# Patient Record
Sex: Male | Born: 1965 | Race: White | Hispanic: No | Marital: Married | Smoking: Current every day smoker
Health system: Southern US, Community
[De-identification: ages and names within clinical notes are randomized; demographics above are authoritative.]

## PROBLEM LIST (undated history)

## (undated) DIAGNOSIS — I10 Essential (primary) hypertension: Secondary | ICD-10-CM

## (undated) DIAGNOSIS — R002 Palpitations: Secondary | ICD-10-CM

## (undated) DIAGNOSIS — R160 Hepatomegaly, not elsewhere classified: Secondary | ICD-10-CM

## (undated) DIAGNOSIS — R0602 Shortness of breath: Secondary | ICD-10-CM

## (undated) DIAGNOSIS — K7030 Alcoholic cirrhosis of liver without ascites: Secondary | ICD-10-CM

## (undated) DIAGNOSIS — K219 Gastro-esophageal reflux disease without esophagitis: Secondary | ICD-10-CM

## (undated) DIAGNOSIS — F419 Anxiety disorder, unspecified: Secondary | ICD-10-CM

## (undated) DIAGNOSIS — I493 Ventricular premature depolarization: Secondary | ICD-10-CM

## (undated) DIAGNOSIS — I8390 Asymptomatic varicose veins of unspecified lower extremity: Secondary | ICD-10-CM

## (undated) HISTORY — DX: Ventricular premature depolarization: I49.3

## (undated) HISTORY — DX: Gastro-esophageal reflux disease without esophagitis: K21.9

## (undated) HISTORY — DX: Anxiety disorder, unspecified: F41.9

## (undated) HISTORY — DX: Palpitations: R00.2

## (undated) HISTORY — PX: WISDOM TOOTH EXTRACTION: SHX21

## (undated) HISTORY — DX: Asymptomatic varicose veins of unspecified lower extremity: I83.90

## (undated) HISTORY — DX: Essential (primary) hypertension: I10

## (undated) HISTORY — DX: Alcoholic cirrhosis of liver without ascites: K70.30

## (undated) HISTORY — PX: OTHER SURGICAL HISTORY: SHX169

---

## 2002-03-06 ENCOUNTER — Emergency Department (HOSPITAL_COMMUNITY): Admission: EM | Admit: 2002-03-06 | Discharge: 2002-03-06 | Payer: Self-pay | Admitting: *Deleted

## 2002-03-06 ENCOUNTER — Encounter: Payer: Self-pay | Admitting: *Deleted

## 2002-08-24 ENCOUNTER — Ambulatory Visit (HOSPITAL_COMMUNITY): Admission: RE | Admit: 2002-08-24 | Discharge: 2002-08-24 | Payer: Self-pay | Admitting: Family Medicine

## 2002-10-24 ENCOUNTER — Encounter: Payer: Self-pay | Admitting: *Deleted

## 2002-10-24 ENCOUNTER — Emergency Department (HOSPITAL_COMMUNITY): Admission: EM | Admit: 2002-10-24 | Discharge: 2002-10-24 | Payer: Self-pay | Admitting: *Deleted

## 2004-11-22 ENCOUNTER — Ambulatory Visit (HOSPITAL_COMMUNITY): Admission: RE | Admit: 2004-11-22 | Discharge: 2004-11-22 | Payer: Self-pay | Admitting: Family Medicine

## 2005-05-12 ENCOUNTER — Emergency Department (HOSPITAL_COMMUNITY): Admission: EM | Admit: 2005-05-12 | Discharge: 2005-05-12 | Payer: Self-pay | Admitting: Emergency Medicine

## 2008-09-13 ENCOUNTER — Ambulatory Visit: Payer: Self-pay | Admitting: Cardiology

## 2008-09-14 ENCOUNTER — Encounter: Payer: Self-pay | Admitting: Cardiology

## 2008-09-14 LAB — CONVERTED CEMR LAB
ALT: 26 units/L
AST: 18 units/L
Albumin: 4.4 g/dL
Alkaline Phosphatase: 52 units/L
BUN: 11 mg/dL
Bilirubin, Direct: 1 mg/dL
CO2: 22 meq/L
Calcium: 9.7 mg/dL
Chloride: 105 meq/L
Creatinine, Ser: 0.9 mg/dL
Glucose, Bld: 83 mg/dL
HCT: 47.4 %
Hemoglobin: 15.6 g/dL
MCV: 102 fL
Magnesium: 1.9 mg/dL
Platelets: 212 10*3/uL
Potassium: 4.8 meq/L
Sodium: 140 meq/L
TSH: 2.3 microintl units/mL
Total Protein: 6.8 g/dL
WBC: 6.9 10*3/uL

## 2008-10-11 ENCOUNTER — Ambulatory Visit: Payer: Self-pay | Admitting: Cardiology

## 2008-11-23 ENCOUNTER — Ambulatory Visit: Payer: Self-pay | Admitting: Cardiology

## 2009-03-08 ENCOUNTER — Encounter (INDEPENDENT_AMBULATORY_CARE_PROVIDER_SITE_OTHER): Payer: Self-pay | Admitting: *Deleted

## 2009-05-05 ENCOUNTER — Ambulatory Visit: Payer: Self-pay | Admitting: Internal Medicine

## 2009-05-10 ENCOUNTER — Encounter: Payer: Self-pay | Admitting: Internal Medicine

## 2009-05-19 ENCOUNTER — Ambulatory Visit: Payer: Self-pay | Admitting: Internal Medicine

## 2009-05-19 ENCOUNTER — Ambulatory Visit (HOSPITAL_COMMUNITY): Admission: RE | Admit: 2009-05-19 | Discharge: 2009-05-19 | Payer: Self-pay | Admitting: Internal Medicine

## 2009-05-19 HISTORY — PX: ESOPHAGOGASTRODUODENOSCOPY: SHX1529

## 2009-06-27 ENCOUNTER — Encounter: Payer: Self-pay | Admitting: Gastroenterology

## 2009-06-29 ENCOUNTER — Encounter: Payer: Self-pay | Admitting: Gastroenterology

## 2009-07-01 ENCOUNTER — Encounter: Payer: Self-pay | Admitting: Gastroenterology

## 2009-07-05 ENCOUNTER — Telehealth (INDEPENDENT_AMBULATORY_CARE_PROVIDER_SITE_OTHER): Payer: Self-pay

## 2009-07-27 ENCOUNTER — Encounter: Payer: Self-pay | Admitting: Gastroenterology

## 2010-04-26 ENCOUNTER — Encounter: Payer: Self-pay | Admitting: Urgent Care

## 2010-05-18 ENCOUNTER — Ambulatory Visit: Payer: Self-pay | Admitting: Cardiology

## 2010-05-18 DIAGNOSIS — R1031 Right lower quadrant pain: Secondary | ICD-10-CM

## 2010-05-18 DIAGNOSIS — R1310 Dysphagia, unspecified: Secondary | ICD-10-CM | POA: Insufficient documentation

## 2010-05-18 DIAGNOSIS — K5900 Constipation, unspecified: Secondary | ICD-10-CM | POA: Insufficient documentation

## 2010-05-18 DIAGNOSIS — K219 Gastro-esophageal reflux disease without esophagitis: Secondary | ICD-10-CM

## 2010-05-18 DIAGNOSIS — F17200 Nicotine dependence, unspecified, uncomplicated: Secondary | ICD-10-CM

## 2010-05-18 DIAGNOSIS — N529 Male erectile dysfunction, unspecified: Secondary | ICD-10-CM

## 2011-01-09 NOTE — Medication Information (Signed)
Summary: PA for dexilant  PA for dexilant   Imported By: Hendricks Limes LPN 16/09/9603 54:09:81  _____________________________________________________________________  External Attachment:    Type:   Image     Comment:   External Document

## 2011-01-09 NOTE — Assessment & Plan Note (Signed)
Summary: F1Y  Medications Added PRILOSEC 40 MG CPDR (OMEPRAZOLE) TAKE 1 TAB DAILY TYLENOL 325 MG TABS (ACETAMINOPHEN) TAKE AS NEEDED NICOTINE 21-14-7 MG/24HR KIT (NICOTINE) take as directed by MD      Allergies Added: NKDA  Visit Type:  Follow-up Referring Provider:  Lubertha South Primary Provider:  Lubertha South   History of Present Illness: Bradley Miles returns to the office for continuing assessment and treatment of palpitations related to PVCs, tobacco abuse, hypertension and erectile dysfunction.  Since his most recent visit, he has been generally well.  Blood pressure control has been good when at rest.  He sometimes omits his dose of acebutolol and experiences exacerbation of his palpitations almost immediately thereafter.  He continues to smoke cigarettes.  A trial of Wellbutrin resulted in him feeling generally better, but not helping to curb his tobacco addiction.  He subsequently tried to an over-the-counter electric cigarette, probably a nicotine replacement devices, with a decrease in cigarette consumption to less than 5 per day; however, when he stopped using the device, he resumed his usual tobacco consumption.      Current Medications (verified): 1)  Miralax  Powd (Polyethylene Glycol 3350) .... One Capful Daily Prn 2)  Prilosec 40 Mg Cpdr (Omeprazole) .... Take 1 Tab Daily 3)  Acebutolol Hcl 400 Mg Caps (Acebutolol Hcl) .... Take 1 Tablet By Mouth Two Times A Day 4)  Tylenol 325 Mg Tabs (Acetaminophen) .... Take As Needed 5)  Nicotine 21-14-7 Mg/24hr Kit (Nicotine) .... Take As Directed By Md  Allergies (verified): No Known Drug Allergies  Past History:  PMH, FH, and Social History reviewed and updated.  Review of Systems       The patient complains of weight gain.  The patient denies anorexia, vision loss, decreased hearing, hoarseness, chest pain, syncope, dyspnea on exertion, peripheral edema, prolonged cough, headaches, hemoptysis, abdominal pain,  melena, and hematochezia.    Vital Signs:  Patient profile:   45 year old male Weight:      242 pounds BMI:     32.04 Pulse rate:   75 / minute BP sitting:   122 / 74  (right arm)  Vitals Entered By: Dreama Saa, CNA (May 18, 2010 2:36 PM)  Physical Exam  General:  Well developed; no acute distress Weight-242, and 10 pounds more than last year Neck-No JVD; no carotid bruits: Lungs-No tachypnea, no rales; no rhonchi; no wheezes: Cardiovascular-normal PMI; normal S1 and S2; modest systolic ejection murmur Abdomen-BS normal; soft and non-tender without masses or organomegaly:  Musculoskeletal-No deformities, no cyanosis or clubbing: Neurologic-Normal cranial nerves; symmetric strength and tone:  Skin-Warm, no significant lesions: Extremities-Nl distal pulses; no edema:     Impression & Recommendations:  Problem # 1:  TOBACCO ABUSE (ICD-305.1) Patient appears somewhat motivated to quit tobacco use.  Since nicotine replacement appeared to be somewhat effective, he will try an over-the-counter transdermal preparation at a dose of 21 mg per day.  He is encouraged to call should this approach not prove effective.  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  Problem # 3:  HYPERTENSION (ICD-401.1) Blood pressure is and has been in good on beta blocker alone.  Weight is substantially increased over the past year; decreased caloric intake and increased physical activity advised.  Patient Instructions: 1)  Your physician recommends that you schedule a follow-up appointment in: 1 year 2)  Your physician has recommended you make the following change  in your medication: Start using Nicotine patches to help you stop smoking 3)  Your physician discussed the hazards of tobacco use.  Tobacco use cessation is recommended and techniques and options to help you quit were discussed.

## 2011-04-24 NOTE — Op Note (Signed)
NAME:  Bradley Miles, Bradley Miles               ACCOUNT NO.:  [id number]   MEDICAL RECORD NO.:  [id number]          PATIENT TYPE:  AMB   LOCATION:  DAY                           FACILITY:  [facility]   PHYSICIAN:  R. Roetta Sessions, M.D. DATE OF BIRTH:  1966-01-01   DATE OF PROCEDURE:  05/19/2009  DATE OF DISCHARGE:                               OPERATIVE REPORT   PROCEDURE:  EGD with Elease Hashimoto dilation.   INDICATIONS FOR PROCEDURE:  A 45 year old gentleman with longstanding  gastroesophageal reflux disease symptoms with intermittent esophageal  dysphagia.  He has also had some bilateral lower quadrant abdominal pain  recently in a setting of constipation.  He is here for EGD with  esophageal dilation if it is appropriate.  The risks, benefits,  alternatives and limitations have been discussed with him previously and  again at the bedside.  Of note, his lower quadrant abdominal pain has  improved dramatically with the use of MiraLax which has temporarily been  associated with improved bowel function.   PROCEDURE NOTE:  O2 saturation, blood pressure, pulse and respirations  were monitored throughout the entirety of the procedure.  Conscious  sedation with Versed 5 mg IV, Demerol 125 mg IV in divided doses.  Instrument Pentax video chip system.  Cetacaine spray for topical  pharyngeal anesthesia.   FINDINGS:  Examination of the tubular esophagus revealed geographic  distal esophageal erosions with linear erosions extending approximately  4 cm up from the GE junction.  There was also a noncritical appearing  Schatzki's ring.  There was no feline appearance or furrows and nothing  to suggest eosinophilic esophagitis.  There was no evidence of Barrett's  esophagus.  EG junction easily traversed and entering the stomach.  Gastric cavity was emptied and insufflated well with air.  Thorough  examination of the gastric mucosa including retroflex of the proximal  stomach esophagogastric junction demonstrated  only a small hiatal  hernia.  Pylorus was patent and easily traversed.  Examination of the  bulb and second portion revealed no abnormalities.  Therapeutic/diagnostic maneuvers performed.  The scope was withdrawn.  A  56 French Maloney dilator was passed to full insertion with ease.  A  look back revealed the ring had been ruptured nicely with minimal  bleeding, otherwise the esophagus sustained no trauma whatsoever.  The  patient tolerated the procedure well and was reactive after endoscopy.   IMPRESSION:  1. Geographic distal esophageal erosions consistent with rather severe      erosive reflux esophagitis.  2. Schatzki's ring status post dilation.  3. Otherwise unremarkable esophagus.  4. Small hiatal hernia otherwise normal stomach, D1, D2.   RECOMMENDATIONS:  1. Antireflux literature provided to Bradley Miles.  2. Begin Nexium 40 mg orally twice daily, i.e. before breakfast and      supper for 2 weeks then drop back to once daily before breakfast      thereafter.  3. He is to go by my office for the first 2 weeks of therapy (free      samples).  I have written him a prescription for once daily  therapy.  4. Plan to see this nice gentleman back in 3 months.      Jonathon Bellows, M.D.  Electronically Signed     RMR/MEDQ  D:  05/19/2009  T:  05/19/2009  Job:  [id number]   cc:   Donna Bernard, M.D.  Fax: [phone]

## 2011-04-24 NOTE — Letter (Signed)
September 13, 2008    W. Simone Curia, MD  [address]  [place], [place] [address]   RE:  Bradley Miles, Bradley Miles  MRN:  [id number]  /  DOB:  [birth year]   Dear Brett Canales,   Bradley Miles is seen in the office today in consultation at your request  for palpitations.  As you know, this nice gentleman has enjoyed  generally excellent health.  He was hospitalized overnight only once,  and that was for a tooth abscess.  He has had some sort of a cyst or  tumor removed from his lower back in an outpatient procedure.  He has  had mild hypertension managed with a single agent.  Unfortunately, he  smokes 1-1/2 packs of cigarettes per day.  He has had stress tests in  the past related to evaluations for palpitations, most recently 6 years  ago, with negative results.   Of late, he has noted increase in palpitations.  These are usually  single extra beats with no other symptoms.  He has had spells with rapid  heart rate that had been prolonged.  On one occasion, he came to the  emergency department, but the arrhythmia had resolved by the time he  arrived.  There is some lightheadedness with these latter spells.  They  occur perhaps once per month while the lesser symptoms occur on a daily  basis.  He does not use significant amounts of caffeine.   CURRENT MEDICATION:  Only metoprolol 50 mg b.i.d.   ALLERGIES:  He has no known drug allergies.   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  No excessive use of alcohol.   FAMILY HISTORY:  Father died at age 101 due to a brain tumor.  Mother is  alive, but has some heart problems.  Two sisters are alive and well.   REVIEW OF SYSTEMS:  Notable for occasional headaches, the need for  corrective lenses - he uses contacts.  He has some GERD.   PHYSICAL EXAMINATION:  GENERAL:  Pleasant and well-appearing young  gentleman.  VITAL SIGNS:  The weight is 242, blood pressure 120/80, heart rate 85  and  regular, and respirations 14.  NECK:  No jugular venous distention; normal carotid upstrokes without  bruits.  ENDOCRINE:  No thyromegaly.  HEMATOPOIETIC:  No adenopathy.  SKIN:  No significant lesions.  HEENT:  Anicteric sclerae; normal lids and conjunctivae; contact lenses  in place; and normal oral mucosa.  LUNGS:  Clear.  CARDIAC:  Normal first and second heart sounds; normal PMI.  ABDOMEN:  Soft and nontender; normal bowel sounds; and no organomegaly.  EXTREMITIES:  No edema; normal distal pulses.  NEUROLOGIC:  Symmetric strength and tone; normal cranial nerves.   EKG:  Normal sinus rhythm; delayed R-wave progression; leftward axis;  RSR prime; otherwise normal.  No prior tracing for comparison.   IMPRESSION:  Bradley Miles symptoms are definitely consistent with  premature ventricular contractions or premature atrial contractions for  the most part; however, his prolonged spells could represent atrial  fibrillation or paroxysmal supraventricular tachycardia.  A more serious  arrhythmia is unlikely.  We will proceed with an event recorder to more  accurately characterize the nature of his presumed arrhythmia.  He will  discontinue metoprolol during the recording interval.  He will monitor  blood pressure and report any substantially elevated values.  He reports  no recent lab work.  We will obtain a CBC, chemistry  profile, TSH, and  magnesium level.  I will reassess this nice gentleman after his period  of event recording has been completed.  Thanks so much for sending him  to see me.    Sincerely,      Gerrit Friends. Dietrich Pates, MD, [facility]  Electronically Signed    RMR/MedQ  DD: 09/13/2008  DT: 09/14/2008  Job #: [id number]

## 2011-04-24 NOTE — Letter (Signed)
October 11, 2008    W. Simone Curia, MD  [address]  [place], [place] [address]   RE:  Bradley Miles, Bradley Miles  MRN:  [id number]  /  DOB:  09-28-1966   Dear Bradley Miles,   Bradley Miles returns to the office for continued assessment and treatment  of palpitations.  He has carried an event recorder and demonstrated  accuracy in identifying infrequent PVCs.  Every time, he reported a  skipped beat, a PVC was present.  He also reported periods of heart  racing with no arrhythmia was noted.  On some occasions, sinus  tachycardia up to a rate of 120 was present, but on others, the heart  rate was less than 100.  He feels that his arrhythmia has been about at  baseline for the past month.  He does not note any significant change  with stopping metoprolol.   He also reports erectile dysfunction.  This was first evaluated by  urologist 12 years ago.  He uses Viagra one-third of a 100 mg tablet  with generally good results.   PHYSICAL EXAMINATION:  GENERAL:  Pleasant, healthy youthful gentleman.  VITAL SIGNS:  The weight is 236, 6 pounds less than at his last visit,  blood pressure 120/90, heart rate 72 and regular, respirations 14.  NECK:  No jugular venous distention.  LUNGS:  Clear.  CARDIAC:  Normal first and second heart sounds.   List of blood pressures was brought in, of 16 measurements,  approximately three-quarter show blood pressures between 140 and 170  systolic.  Diastolics are typically between 90 and 105.   IMPRESSION:  Bradley Miles has premature ventricular contractions that are  symptomatic.  These almost certainly do not represent significant  organic heart disease.  In attempt to control these symptoms, acebutolol  will be started at a dose of 200 mg b.i.d.  This will be increased by  the patient to 400 mg b.i.d. in 2 weeks if symptoms are inadequately  controlled.  He will monitor blood pressure, which certainly needs  pharmacologic therapy.  I will see him again in 1  month.    Sincerely,      Gerrit Friends. Dietrich Pates, MD, [facility]  Electronically Signed    RMR/MedQ  DD: 10/11/2008  DT: 10/12/2008  Job #: [id number]

## 2011-04-24 NOTE — Letter (Signed)
November 23, 2008    W. Simone Curia, M.D.  [address]  [place], [place] [address]   RE:  Bradley Miles, Bradley Miles  MRN:  [id number]  /  DOB:  1966-10-11   Dear Bradley Miles,   Bradley Miles returns to the office for continued assessment and treatment  of palpitations related to PVCs.  He did not derive much benefit from  Sectral 200 mg b.i.d., but when the dose was increased to 400 mg, he  noted a marked decrease in frequency of symptoms.  Although not  completely abolished, he notes perhaps one or two brief episodes per  day, a level he finds quite acceptable.   We also discussed cigarette smoking extensively.  He is willing to  attempt to quit.  He has tried before without pharmacologic aids and  lasted approximately 2 weeks.  I explained the need to be committed to  quit and to keep trying.  He would like to start with Zaiban, a  prescription which was given to him.   PHYSICAL EXAMINATION:  GENERAL:  Healthy-appearing young gentleman.  VITAL SIGNS:  The weight is 237 pounds, 5 pounds less than at his last  visit.  Blood pressure 125/70, heart rate 65 and regular, respirations  14.  NECK:  No jugular venous distention.  LUNGS:  Clear.  CARDIAC:  Normal first and second heart sounds.   IMPRESSION:  Bradley Miles is doing well on beta-blocker.  This has also  resulted in slowing of his heart rate and an improvement in his blood  pressure.  You might want to follow up on smoking cessation since I will  not be seeing him for 12 months.    Sincerely,      Gerrit Friends. Dietrich Pates, MD, [facility]  Electronically Signed    RMR/MedQ  DD: 11/23/2008  DT: 11/24/2008  Job #: [id number]

## 2011-04-27 NOTE — Procedures (Signed)
   NAME:  Bradley Miles, Bradley Miles NO.:  [id number]   MEDICAL RECORD NO.:  [id number]                  PATIENT TYPE:   LOCATION:                                       FACILITY:   PHYSICIAN:  Donna Bernard, M.D.             DATE OF BIRTH:   DATE OF PROCEDURE:  08/24/2002  DATE OF DISCHARGE:                                    STRESS TEST   INDICATIONS FOR TEST:  This patient is a 74-ish year-old white male with a  history of atypical chest pain with exertion.  He has also had significant  aggravations with heart skipping.  Recently was seen in the emergency room.  Years ago he had test which confirmed PVCs.   STRESS TEST:  Stress test was performed with standard Bruce protocol.  Resting EKG revealed normal sinus rhythm.  No significant ST-T changes.  The  patient tolerated the first two stages well.  Two minutes into the third  stage he developed progressive fatigue.  He surpassed the submaxed predicted  heart rate of 158.  At that point at 0.08 seconds past the J point there  were no significant ST changes noted.  Of importance, the patient did have  several PVCs which occurred in the first couple stages of his test.   IMPRESSION:  1. Negative adequate stress test.  2. Premature ventricular contractions.  Discussed with patient with his     significant amount of anxiety and stress, patient would likely take an     antianxiety medicine to see if it helps.   PLAN:  Initiate Lexapro 10 mg daily.  Rationale discussed.  Exercise  encouraged.  Follow up in several weeks.                                               Donna Bernard, M.D.    WSL/MEDQ  D:  08/24/2002  T:  08/24/2002  Job:  [id number]

## 2011-06-11 ENCOUNTER — Other Ambulatory Visit: Payer: Self-pay | Admitting: *Deleted

## 2011-06-11 ENCOUNTER — Encounter: Payer: Self-pay | Admitting: *Deleted

## 2011-06-11 ENCOUNTER — Other Ambulatory Visit: Payer: Self-pay | Admitting: Cardiology

## 2011-06-11 MED ORDER — ACEBUTOLOL HCL 400 MG PO CAPS: 400.0000 mg | ORAL_CAPSULE | Freq: Two times a day (BID) | ORAL | Status: DC

## 2011-07-13 ENCOUNTER — Other Ambulatory Visit: Payer: Self-pay | Admitting: Cardiology

## 2011-08-29 ENCOUNTER — Other Ambulatory Visit: Payer: Self-pay | Admitting: Gastroenterology

## 2011-08-29 NOTE — Telephone Encounter (Signed)
Pt to be set up for routine office visit f/u for further refills.

## 2011-08-29 NOTE — Telephone Encounter (Signed)
Please set up for appt for routine OV, refills.

## 2011-08-30 ENCOUNTER — Encounter: Payer: Self-pay | Admitting: Internal Medicine

## 2011-08-30 NOTE — Telephone Encounter (Signed)
Letter mailed to patient to call and set up OV to further his refills

## 2011-10-01 ENCOUNTER — Other Ambulatory Visit: Payer: Self-pay | Admitting: Cardiology

## 2011-10-01 NOTE — Telephone Encounter (Signed)
Patient past due for follow up.  Appointment made @ this time.

## 2011-10-04 ENCOUNTER — Encounter: Payer: Self-pay | Admitting: Adult Health

## 2011-10-04 ENCOUNTER — Ambulatory Visit (INDEPENDENT_AMBULATORY_CARE_PROVIDER_SITE_OTHER): Payer: 59 | Admitting: Adult Health

## 2011-10-04 VITALS — BP 143/87 | HR 76 | Resp 18 | Ht 73.0 in | Wt 243.0 lb

## 2011-10-04 DIAGNOSIS — R002 Palpitations: Secondary | ICD-10-CM | POA: Insufficient documentation

## 2011-10-04 NOTE — Patient Instructions (Signed)
Your physician recommends that you continue on your current medications as directed. Please refer to the Current Medication list given to you today.  Your physician recommends that you schedule a follow-up appointment in: 1 year  

## 2011-10-04 NOTE — Progress Notes (Signed)
HPI: [name] is a 45 y/o patient of Dr. Dietrich Pates who  returns to the office for continuing assessment and treatment of palpitations, PVC's, hypertension and ongoing tobacco abuse.  He was placed on acebutolol by Dr. Dietrich Pates on last visit and has had only occasional episodes of these since. He attributes it to drinking caffeine, or usually after a cigarette. He has no plans to quit tobacco, but has cut down on the caffeine intake.  No Known Allergies  Current Outpatient Prescriptions  Medication Sig Dispense Refill  . acebutolol (SECTRAL) 400 MG capsule take 1 capsule by mouth twice a day  60 capsule  1  . DEXILANT 60 MG capsule TAKE 1 CAPSULE BY MOUTH BEFORE BREAKFAST  31 capsule  1    RUE:[id number] of systems complete and found to be negative unless listed above PHYSICAL EXAM BP 143/87  Pulse 76  Resp 18  Ht 6\' 1"  (1.854 m)  Wt 110.224 kg (243 lb)  BMI 32.06 kg/m2  ASSESSMENT AND PLAN

## 2011-10-04 NOTE — Assessment & Plan Note (Signed)
He reports improvement in symptoms unless he smokes or drinks caffeine.  He continues medically compliant, however. I have advised him to cut out caffeine completely and try to quit smoking as he apparently is very sensitive to stimulants.

## 2011-11-07 ENCOUNTER — Other Ambulatory Visit: Payer: Self-pay

## 2011-11-07 NOTE — Telephone Encounter (Signed)
No refills until OV as per note in 08/2011.

## 2011-11-12 NOTE — Telephone Encounter (Signed)
Tried to call the pt but I leaved a message to call back. I also called the drug store and advised them that he needed an office appointment.

## 2011-11-14 ENCOUNTER — Encounter: Payer: Self-pay | Admitting: Internal Medicine

## 2011-11-14 NOTE — Telephone Encounter (Signed)
Pt has an appointment on 11/15/11

## 2011-11-15 ENCOUNTER — Encounter: Payer: Self-pay | Admitting: Gastroenterology

## 2011-11-15 ENCOUNTER — Ambulatory Visit (INDEPENDENT_AMBULATORY_CARE_PROVIDER_SITE_OTHER): Payer: 59 | Admitting: Gastroenterology

## 2011-11-15 VITALS — BP 139/87 | HR 72 | Temp 98.2°F | Ht 73.0 in | Wt 242.2 lb

## 2011-11-15 DIAGNOSIS — K219 Gastro-esophageal reflux disease without esophagitis: Secondary | ICD-10-CM

## 2011-11-15 DIAGNOSIS — Z8371 Family history of colonic polyps: Secondary | ICD-10-CM

## 2011-11-15 MED ORDER — DEXLANSOPRAZOLE 60 MG PO CPDR: 60.0000 mg | DELAYED_RELEASE_CAPSULE | Freq: Every day | ORAL | Status: DC

## 2011-11-15 NOTE — Patient Instructions (Signed)
I have sent a prescription for Dexilant to [facility] in [place]. Based on your family history of colon polyps, you should have a colonoscopy. Call us when you are ready. Please limit your alcohol use.   Diet for GERD or PUD Nutrition therapy can help ease the discomfort of gastroesophageal reflux disease (GERD) and peptic ulcer disease (PUD).  HOME CARE INSTRUCTIONS   Eat your meals slowly, in a relaxed setting.   Eat 5 to 6 small meals per day.   If a food causes distress, stop eating it for a period of time.  FOODS TO AVOID  Coffee, regular or decaffeinated.   Cola beverages, regular or low calorie.   Tea, regular or decaffeinated.   Pepper.   Cocoa.   High fat foods, including meats.   Butter, margarine, hydrogenated oil (trans fats).   Peppermint or spearmint (if you have GERD).   Fruits and vegetables if not tolerated.   Alcohol.   Nicotine (smoking or chewing). This is one of the most potent stimulants to acid production in the gastrointestinal tract.   Any food that seems to aggravate your condition.  If you have questions regarding your diet, ask your caregiver or a registered dietitian. TIPS  Lying flat may make symptoms worse. Keep the head of your bed raised 6 to 9 inches (15 to 23 cm) by using a foam wedge or blocks under the legs of the bed.   Do not lay down until 3 hours after eating a meal.   Daily physical activity may help reduce symptoms.  MAKE SURE YOU:   Understand these instructions.   Will watch your condition.   Will get help right away if you are not doing well or get worse.  Document Released: 11/26/2005 Document Revised: 08/08/2011 Document Reviewed: 04/11/2009 [facility] Patient Information 2012 [place], [place].

## 2011-11-15 NOTE — Progress Notes (Signed)
Primary Care Physician: Harlow Asa, MD, MD  Primary Gastroenterologist:  Roetta Sessions, MD   Chief Complaint  Patient presents with  . Medication Refill  . Gastrophageal Reflux    HPI: Bradley Miles is a 45 y.o. male here for followup of acid reflux disease. Last seen over 2 years ago. EGD at that time showed severe reflux esophagitis, Schatzki ring, small hiatal hernia. His esophagus was dilated. He has been on Dexilant 60 mg daily with good control. If he misses his medication 2 days he has recurrent heartburn. Recently ran out of his medication has been taking over-the-counter omeprazole and having refractory heartburn. Denies vomiting, abdominal pain, constipation, diarrhea, melena, rectal bleeding, dysphagia. Tells me that he has 2 sisters who recently had colonoscopies and both had colon polyps. They are in their mid 54s. He's been advised to have a colonoscopy due to family history of adenomatous colon polyps. No family history of colon cancer.  Past Surgical History  Procedure Date  . Esophagogastroduodenoscopy 05/19/09    Geographic distal esophageal erosions consistent with severe erosive reflux esophagitis. schatzki's ring s/P dilation/small hiatal hernia otherwise normal stomach  . Cyst reomved     from spine     Current Outpatient Prescriptions  Medication Sig Dispense Refill  . acebutolol (SECTRAL) 400 MG capsule take 1 capsule by mouth twice a day  60 capsule  1  . DEXILANT 60 MG capsule TAKE 1 CAPSULE BY MOUTH BEFORE BREAKFAST  31 capsule  1    Allergies as of 11/15/2011  . (No Known Allergies)    ROS:  General: Negative for anorexia, weight loss, fever, chills, fatigue, weakness. ENT: Negative for hoarseness, difficulty swallowing , nasal congestion. CV: Negative for chest pain, angina, palpitations, dyspnea on exertion, peripheral edema.  Respiratory: Negative for dyspnea at rest, dyspnea on exertion, cough, sputum, wheezing.  GI: See history of present  illness. GU:  Negative for dysuria, hematuria, urinary incontinence, urinary frequency, nocturnal urination.  Endo: Negative for unusual weight change.    Physical Examination:   BP 139/87  Pulse 72  Temp(Src) 98.2 F (36.8 C) (Temporal)  Ht 6\' 1"  (1.854 m)  Wt 242 lb 3.2 oz (109.861 kg)  BMI 31.95 kg/m2  General: Well-nourished, well-developed in no acute distress.  Eyes: No icterus. Mouth: Oropharyngeal mucosa moist and pink , no lesions erythema or exudate. Lungs: Clear to auscultation bilaterally.  Heart: Regular rate and rhythm, no murmurs rubs or gallops.  Abdomen: Bowel sounds are normal, nontender, nondistended, no hepatosplenomegaly or masses, no abdominal bruits or hernia , no rebound or guarding.   Extremities: No lower extremity edema. No clubbing or deformities. Neuro: Alert and oriented x 4   Skin: Warm and dry, no jaundice.   Psych: Alert and cooperative, normal mood and affect.

## 2011-11-15 NOTE — Assessment & Plan Note (Signed)
Resume Dexilant. Rebate card and samples provided. New RX sent to his pharmacy. Discussed anti-reflux measures.  Recommended limiting alcohol use. Currently not excessive, but borderline.

## 2011-11-15 NOTE — Assessment & Plan Note (Addendum)
Recommend colonoscopy given family history of colon polyps in 2 sisters in their mid 43s. Patient is aware of recommendation, he will call as when he is ready to schedule procedure.

## 2011-11-15 NOTE — Progress Notes (Signed)
Cc to PCP 

## 2011-11-22 NOTE — Progress Notes (Signed)
REVIEWED.  

## 2012-01-05 ENCOUNTER — Other Ambulatory Visit: Payer: Self-pay | Admitting: Cardiology

## 2012-01-07 ENCOUNTER — Telehealth: Payer: Self-pay | Admitting: Cardiology

## 2012-01-07 ENCOUNTER — Telehealth: Payer: Self-pay | Admitting: Adult Health

## 2012-01-07 MED ORDER — ACEBUTOLOL HCL 400 MG PO CAPS: 400.0000 mg | ORAL_CAPSULE | Freq: Every day | ORAL | Status: DC

## 2012-01-07 NOTE — Telephone Encounter (Signed)
Patient needs refill on Acebutolol called in to [facility] in [place]. / tg

## 2012-01-07 NOTE — Telephone Encounter (Signed)
PHARMACY NEEDS CLARIFICATION ON DIRECTIONS FOR ACEBUTOLOL

## 2012-01-08 ENCOUNTER — Other Ambulatory Visit: Payer: Self-pay | Admitting: *Deleted

## 2012-01-08 MED ORDER — ACEBUTOLOL HCL 400 MG PO CAPS: 400.0000 mg | ORAL_CAPSULE | Freq: Two times a day (BID) | ORAL | Status: DC

## 2012-01-18 ENCOUNTER — Encounter: Payer: Self-pay | Admitting: Adult Health

## 2012-01-18 ENCOUNTER — Ambulatory Visit (INDEPENDENT_AMBULATORY_CARE_PROVIDER_SITE_OTHER): Payer: 59 | Admitting: Adult Health

## 2012-01-18 VITALS — BP 150/97 | HR 70 | Resp 18 | Ht 73.0 in | Wt 250.0 lb

## 2012-01-18 DIAGNOSIS — I10 Essential (primary) hypertension: Secondary | ICD-10-CM | POA: Insufficient documentation

## 2012-01-18 DIAGNOSIS — F17200 Nicotine dependence, unspecified, uncomplicated: Secondary | ICD-10-CM

## 2012-01-18 DIAGNOSIS — G4700 Insomnia, unspecified: Secondary | ICD-10-CM | POA: Insufficient documentation

## 2012-01-18 DIAGNOSIS — E7800 Pure hypercholesterolemia, unspecified: Secondary | ICD-10-CM

## 2012-01-18 MED ORDER — ZOLPIDEM TARTRATE 5 MG PO TABS: 5.0000 mg | ORAL_TABLET | Freq: Every evening | ORAL | Status: DC | PRN

## 2012-01-18 NOTE — Assessment & Plan Note (Signed)
Blood pressure is not adequately controlled currently with self reported high readings at home. I have talked with him about smoking cessation and caffeine again. Will start him on Benicar 20/12.5 mg and samples are provided. He will return in a week or two for BP check in the office.

## 2012-01-18 NOTE — Patient Instructions (Signed)
Your physician has recommended you make the following change in your medication: start taking Benicar 20/12.5 mg daily and Ambien 5 mg at bedtime as needed for sleep  Your physician recommends that you return for lab work in: next week  Your physician recommends that you schedule a follow-up appointment in: 1 month

## 2012-01-18 NOTE — Progress Notes (Signed)
HPI: [name] is a 46 y/o patient of Dr. Dietrich Miles who  returns to the office for continuing assessment and treatment of palpitations, PVC's, hypertension and ongoing tobacco abuse.  He was placed on acebutolol by Dr. Dietrich Miles on last visit and has had only occasional episodes of these since. He attributes it to drinking caffeine, or usually after a cigarette. He has no plans to quit tobacco, but has cut down on the caffeine intake. He states that since being seen last, he has had episodes of dizziness and weakness. He has checked his BP and found it to be elevated at 167-188 systolic over 90-100 diastolic. He rests and it sometimes comes down. He is working a flex shift moving from days to nights every 2 weeks. He admits to not sleeping well and using caffeine to stay awake.   No Known Allergies  Current Outpatient Prescriptions  Medication Sig Dispense Refill  . acebutolol (SECTRAL) 400 MG capsule Take 1 capsule (400 mg total) by mouth 2 (two) times daily.  180 capsule  3  . dexlansoprazole (DEXILANT) 60 MG capsule Take 1 capsule (60 mg total) by mouth daily.  31 capsule  11  . olmesartan-hydrochlorothiazide (BENICAR HCT) 20-12.5 MG per tablet Take 1 tablet by mouth daily.      Marland Kitchen zolpidem (AMBIEN) 5 MG tablet Take 1 tablet (5 mg total) by mouth at bedtime as needed for sleep.  30 tablet  1    ZOX:WRUEAV of systems complete and found to be negative unless listed above PHYSICAL EXAM BP 150/97  Pulse 70  Resp 18  Ht 6\' 1"  (1.854 m)  Wt 250 lb (113.399 kg)  BMI 32.98 kg/m2  General: Well developed, well nourished, in no acute distress Head: Eyes PERRLA, No xanthomas.   Normal cephalic and atramatic  Lungs: Clear bilaterally to auscultation and percussion. Heart: HRRR S1 S2, without MRG.  Pulses are 2+ & equal.            No carotid bruit. No JVD.  No abdominal bruits. No femoral bruits. Abdomen: Bowel sounds are positive, abdomen soft and non-tender without masses or    Hernia's noted. Msk:  Back normal, normal gait. Normal strength and tone for age. Extremities: No clubbing, cyanosis or edema.  DP +1 Neuro: Alert and oriented X 3. Psych:  Good affect, responds appropriately  ASSESSMENT AND PLAN

## 2012-01-18 NOTE — Assessment & Plan Note (Signed)
Discussed smoking cessation again. He says that he will try the patches. I discussed CVRF for continuation of smoking and future cardiac disease. He verbalized understanding.

## 2012-01-18 NOTE — Assessment & Plan Note (Signed)
He works alternating day and night shifts and often has trouble sleeping during the day. I have given him Ambien 5 mg to take prn. He is advised to get black out curtains to assist with daytime light coming in during sleep. He verbalizes understanding.

## 2012-02-01 ENCOUNTER — Telehealth: Payer: Self-pay

## 2012-02-01 ENCOUNTER — Telehealth: Payer: Self-pay | Admitting: Adult Health

## 2012-02-01 NOTE — Telephone Encounter (Signed)
**Note De-Identified  Obfuscation** On last OV with Joni Reining, NP on 01-18-12 pt. was advised to take Benicar 20/12.5 mg daily and Ambien 5 mg at bedtime as needed for sleep. He states that since starting Benicar he has been dizzy and even passed out last night while coughing. Pt. states that he has not stated taking Ambien yet so he feels that Benicar is causing side effects. Please advise./LV

## 2012-02-01 NOTE — Telephone Encounter (Signed)
**Note De-Identified  Obfuscation** Pt. Came into office for orthostatic BP's.  Lying: 145/87  72 Sitting: 135/77  79 Standing: 133/88  76 Pt. had no s/s. Per Joni Reining, NP pt. is advised to stop taking Benicar, to monitor BP and bring back diary to scheduled OV on 02-15-12

## 2012-02-01 NOTE — Telephone Encounter (Signed)
Bring him in for orthostatics. Coughing can cause some syncope in rare occassions. I  Joni Reining NP

## 2012-02-01 NOTE — Telephone Encounter (Signed)
PT WAS PUT ON BENICAR, HE THINKS HE MAY BE HAVING A REACTION TO IT. HE FEELS DIZZY. LAST NIGHT HE HAD A COUGHING SPELL AND PASSED OUT.

## 2012-02-01 NOTE — Telephone Encounter (Signed)
**Note De-Identified  Obfuscation** Pt. Is coming to office today around 3:30 so we can obtain orthostatic BP's.

## 2012-02-04 ENCOUNTER — Other Ambulatory Visit: Payer: Self-pay | Admitting: Adult Health

## 2012-02-05 LAB — HEPATIC FUNCTION PANEL
Albumin: 4.3 g/dL (ref 3.5–5.2)
Bilirubin, Direct: 0.1 mg/dL (ref 0.0–0.3)
Total Bilirubin: 0.6 mg/dL (ref 0.3–1.2)

## 2012-02-05 LAB — BASIC METABOLIC PANEL
BUN: 14 mg/dL (ref 6–23)
Chloride: 106 mEq/L (ref 96–112)
Glucose, Bld: 88 mg/dL (ref 70–99)
Potassium: 5.1 mEq/L (ref 3.5–5.3)

## 2012-02-05 LAB — LIPID PANEL
Cholesterol: 228 mg/dL — ABNORMAL HIGH (ref 0–200)
VLDL: 52 mg/dL — ABNORMAL HIGH (ref 0–40)

## 2012-02-06 ENCOUNTER — Other Ambulatory Visit: Payer: Self-pay

## 2012-02-06 DIAGNOSIS — E782 Mixed hyperlipidemia: Secondary | ICD-10-CM

## 2012-02-06 MED ORDER — ATORVASTATIN CALCIUM 40 MG PO TABS: 40.0000 mg | ORAL_TABLET | Freq: Every day | ORAL | Status: DC

## 2012-02-15 ENCOUNTER — Encounter: Payer: Self-pay | Admitting: Adult Health

## 2012-02-15 ENCOUNTER — Ambulatory Visit (INDEPENDENT_AMBULATORY_CARE_PROVIDER_SITE_OTHER): Payer: 59 | Admitting: Adult Health

## 2012-02-15 DIAGNOSIS — E782 Mixed hyperlipidemia: Secondary | ICD-10-CM | POA: Insufficient documentation

## 2012-02-15 DIAGNOSIS — R002 Palpitations: Secondary | ICD-10-CM

## 2012-02-15 DIAGNOSIS — I10 Essential (primary) hypertension: Secondary | ICD-10-CM

## 2012-02-15 DIAGNOSIS — F17200 Nicotine dependence, unspecified, uncomplicated: Secondary | ICD-10-CM

## 2012-02-15 MED ORDER — ACEBUTOLOL HCL 400 MG PO CAPS: 400.0000 mg | ORAL_CAPSULE | Freq: Two times a day (BID) | ORAL | Status: DC

## 2012-02-15 NOTE — Assessment & Plan Note (Signed)
I reviewed his lipid study with him. He states he does not want to take the avorvastatin.  I showed him a copy of the results with TC  228;TG 261 and LDL 129.  He is reluctant to take any medications or to change eating and alcohol habits.  I have reinforced the need to continue medication. The long term risks are reinforced to him.

## 2012-02-15 NOTE — Assessment & Plan Note (Signed)
He tells me that the BP was elevated on last visit because he was not taking the acebutolol. He did not tolerate benicar/hctz causing him to pass out after coughing. I see that his BP is well controlled without its use today. Will not continue this medication.

## 2012-02-15 NOTE — Assessment & Plan Note (Signed)
He admits to not taking acebutolol regularly, but when his palpitations come back he will begin taking it again. He has not stopped caffeine or tobacco. He does not seem to want to take responsibility for the longterm consequences of medical noncompliance and lifestyle risks despite my counseling.

## 2012-02-15 NOTE — Assessment & Plan Note (Signed)
He has no plans to stop smoking.

## 2012-02-15 NOTE — Progress Notes (Signed)
HPI: Bradley Miles is a 46 y/o patient of Dr. Dietrich Pates who  returns to the office for continuing assessment and treatment of palpitations, PVC's, hypertension and ongoing tobacco abuse.  He was placed on acebutolol by Dr. Dietrich Pates on last visit and has had only occasional episodes of these since. He attributes it to drinking caffeine, or usually after a cigarette. He has no plans to quit tobacco, but has cut down on the caffeine intake. He states that since being seen last, he has had episodes of dizziness and weakness. He has checked his BP and found it to be elevated at 167-188 systolic over 90-100 diastolic. He rests and it sometimes comes down. He is working a flex shift moving from days to nights every 2 weeks. He admits to not sleeping well and using caffeine to stay awake. On last visit, I started him on Benicar/HCTZ for better BP control. He had a syncopal episode after a coughing spell after taking the Benicar/HCTZ.  He stopped taking it. He admits to not taking his acebutolol regularly,or other medications as prescribed. He continues to drink each day to unwind.  No Known Allergies  Current Outpatient Prescriptions  Medication Sig Dispense Refill  . acebutolol (SECTRAL) 400 MG capsule Take 1 capsule (400 mg total) by mouth 2 (two) times daily.  180 capsule  3  . atorvastatin (LIPITOR) 40 MG tablet Take 1 tablet (40 mg total) by mouth daily.  30 tablet  6  . dexlansoprazole (DEXILANT) 60 MG capsule Take 1 capsule (60 mg total) by mouth daily.  31 capsule  11  . zolpidem (AMBIEN) 5 MG tablet Take 1 tablet (5 mg total) by mouth at bedtime as needed for sleep.  30 tablet  1    WJX:BJYNWG of systems complete and found to be negative unless listed above PHYSICAL EXAM BP 134/88  Pulse 74  Ht 6\' 1"  (1.854 m)  Wt 251 lb (113.853 kg)  BMI 33.12 kg/m2  General: Well developed, well nourished, in no acute distress Head: Eyes PERRLA, No xanthomas.   Normal cephalic and atramatic  Lungs: Clear  bilaterally to auscultation and percussion. Heart: HRRR S1 S2, without MRG.  Pulses are 2+ & equal.            No carotid bruit. No JVD.  No abdominal bruits. No femoral bruits. Abdomen: Bowel sounds are positive, abdomen soft and non-tender without masses or                  Hernia's noted. Msk:  Back normal, normal gait. Normal strength and tone for age. Extremities: No clubbing, cyanosis or edema.  DP +1 Neuro: Alert and oriented X 3. Psych:  Good affect, responds appropriately  ASSESSMENT AND PLAN

## 2012-02-15 NOTE — Patient Instructions (Signed)
Your physician recommends that you schedule a follow-up appointment in: 6 months  Your physician recommends that you schedule a follow-up appointment in:  STOP Benicar  RESTART Acebutolol 400 mg twice daily

## 2012-03-06 ENCOUNTER — Other Ambulatory Visit: Payer: Self-pay

## 2012-03-06 DIAGNOSIS — E782 Mixed hyperlipidemia: Secondary | ICD-10-CM

## 2012-07-19 ENCOUNTER — Other Ambulatory Visit: Payer: Self-pay | Admitting: Family Medicine

## 2012-07-19 ENCOUNTER — Ambulatory Visit (HOSPITAL_COMMUNITY)
Admission: RE | Admit: 2012-07-19 | Discharge: 2012-07-19 | Disposition: A | Discharge status: Home / Self Care | Payer: 59 | Source: Ambulatory Visit | Attending: Family Medicine | Admitting: Family Medicine

## 2012-07-19 DIAGNOSIS — S2239XA Fracture of one rib, unspecified side, initial encounter for closed fracture: Secondary | ICD-10-CM | POA: Insufficient documentation

## 2012-07-19 DIAGNOSIS — R05 Cough: Secondary | ICD-10-CM | POA: Insufficient documentation

## 2012-07-19 DIAGNOSIS — R0781 Pleurodynia: Secondary | ICD-10-CM

## 2012-07-19 DIAGNOSIS — X58XXXA Exposure to other specified factors, initial encounter: Secondary | ICD-10-CM | POA: Insufficient documentation

## 2012-07-19 DIAGNOSIS — R059 Cough, unspecified: Secondary | ICD-10-CM | POA: Insufficient documentation

## 2012-07-19 DIAGNOSIS — R079 Chest pain, unspecified: Secondary | ICD-10-CM | POA: Insufficient documentation

## 2012-11-13 ENCOUNTER — Encounter: Payer: Self-pay | Admitting: Internal Medicine

## 2012-11-17 ENCOUNTER — Encounter: Payer: Self-pay | Admitting: Urgent Care

## 2012-11-17 ENCOUNTER — Ambulatory Visit (INDEPENDENT_AMBULATORY_CARE_PROVIDER_SITE_OTHER): Payer: 59 | Admitting: Urgent Care

## 2012-11-17 VITALS — BP 127/76 | HR 81 | Temp 98.4°F | Ht 73.0 in | Wt 257.6 lb

## 2012-11-17 DIAGNOSIS — Z83719 Family history of colon polyps, unspecified: Secondary | ICD-10-CM

## 2012-11-17 DIAGNOSIS — Z8371 Family history of colonic polyps: Secondary | ICD-10-CM

## 2012-11-17 DIAGNOSIS — K219 Gastro-esophageal reflux disease without esophagitis: Secondary | ICD-10-CM

## 2012-11-17 DIAGNOSIS — K921 Melena: Secondary | ICD-10-CM | POA: Insufficient documentation

## 2012-11-17 NOTE — Progress Notes (Signed)
.Referring Provider: Luking, William S, MD Primary Care Physician:  LUKING,W S, MD Primary Gastroenterologist:  Dr. Rourk  Chief Complaint  Patient presents with  . Colonoscopy  . Heartburn    HPI:  Bradley Miles is a 46 y.o. male here to set up colonoscopy & GERD.  He denies lower abdominal pain.  He gives family hx of 2 sisters with multiple adenomatous colon polyps.  He has been advised to have a colonoscopy.  He has had heartburn & regurgitation that has been worse over the last month.  He started Dexilant 60mg daily years ago.  Occasionally skipping doses.  He works rotating shifts.  He has seen small amounts of dark red blood in his stool 2 weeks ago.  He takes a couple Ibuprofen prn couple times per week for headaches.  He drinks etoh almost daily.  Denies dysphagia or odynophagia.  Abdominal discomfort is better with passing flatus.  Last 2010 EGD by Dr Rourk showed severe reflux esophagitis, Schatzki ring, small hiatal hernia. His esophagus was dilated.  Primary Care Physician:  LUKING,W S, MD Primary Gastroenterologist:  Dr.   Chief Complaint  Patient presents with  . Colonoscopy  . Heartburn    HPI:  Bradley Miles is a 46 y.o. male here as a new patient for evaluation of   Past Medical History  Diagnosis Date  . Hypertension   . GERD (gastroesophageal reflux disease)   . Anxiety   . Palpitations     Past Surgical History  Procedure Date  . Esophagogastroduodenoscopy 05/19/09    RMR: Geographic distal esophageal erosions consistent with severe erosive reflux esophagitis. schatzki's ring s/P dilation/small hiatal hernia otherwise normal stomach  . Cyst reomved     from spine    Current Outpatient Prescriptions  Medication Sig Dispense Refill  . acebutolol (SECTRAL) 400 MG capsule Take 1 capsule (400 mg total) by mouth 2 (two) times daily.  180 capsule  3  . atorvastatin (LIPITOR) 40 MG tablet Take 1 tablet (40 mg total) by mouth daily.  30 tablet  6  .  dexlansoprazole (DEXILANT) 60 MG capsule Take 1 capsule (60 mg total) by mouth daily.  31 capsule  11  . zolpidem (AMBIEN) 5 MG tablet Take 5 mg by mouth at bedtime as needed.        Allergies as of 11/17/2012  . (No Known Allergies)    Family History  Problem Relation Age of Onset  . Cancer Mother     ?etiology  . Liver disease Neg Hx   . Colon polyps Sister 45    two sisters    History   Social History  . Marital Status: Single    Spouse Name: N/A    Number of Children: 2  . Years of Education: N/A   Occupational History  . [employer]     chemical mixing operator   Social History Main Topics  . Smoking status: Current Every Day Smoker -- 1.0 packs/day for 30 years    Types: Cigarettes  . Smokeless tobacco: Not on file  . Alcohol Use: Yes     Comment: consumes beer or liquor three to four times weekly. Up to six pack or three shots each occurence or more x 20+ yrs  . Drug Use: Not on file  . Sexually Active: Yes   Other Topics Concern  . Not on file   Social History Narrative   Lives alone  Girlfriend & kids 1/2 time    Review   of Systems: Gen: Denies any fever, chills, sweats, anorexia, fatigue, weakness, malaise, weight loss, and sleep disorder CV: Denies chest pain, angina, palpitations, syncope, orthopnea, PND, peripheral edema, and claudication. Resp: Denies dyspnea at rest, dyspnea with exercise, cough, sputum, wheezing, coughing up blood, and pleurisy. GI: Denies vomiting blood, jaundice, and fecal incontinence.   Denies dysphagia or odynophagia. GU : Denies urinary burning, blood in urine, urinary frequency, urinary hesitancy, nocturnal urination, and urinary incontinence. MS: Denies joint pain, limitation of movement, and swelling, stiffness, low back pain, extremity pain. Denies muscle weakness, cramps, atrophy.  Derm: Denies rash, itching, dry skin, hives, moles, warts, or unhealing ulcers.  Psych: Denies depression, anxiety, memory loss, suicidal  ideation, hallucinations, paranoia, and confusion. Heme: Denies bruising, bleeding, and enlarged lymph nodes. Neuro:  Denies any headaches, dizziness, paresthesias. Endo:  Denies any problems with DM, thyroid, adrenal function.  Physical Exam: BP 127/76  Pulse 81  Temp 98.4 F (36.9 C) (Oral)  Ht 6' 1" (1.854 m)  Wt 257 lb 9.6 oz (116.847 kg)  BMI 33.99 kg/m2 No LMP for male patient. General:   Alert,  Well-developed, obese, pleasant and cooperative in NAD Head:  Normocephalic and atraumatic. Eyes:  Sclera clear, no icterus.   Conjunctiva pink. Ears:  Normal auditory acuity. Nose:  No deformity, discharge, or lesions. Mouth:  No deformity or lesions,oropharynx pink & moist. Neck:  Supple; no masses or thyromegaly. Lungs:  Clear throughout to auscultation.   No wheezes, crackles, or rhonchi. No acute distress. Heart:  Regular rate and rhythm; no murmurs, clicks, rubs,  or gallops. Abdomen: Protuberant.  Normal bowel sounds.  No bruits.  Soft, non-tender and non-distended without masses, hepatosplenomegaly or hernias noted.  No guarding or rebound tenderness.   Rectal:  Deferred. Msk:  Symmetrical without gross deformities. Normal posture. Pulses:  Normal pulses noted. Extremities:  No clubbing or edema. Neurologic:  Alert and  oriented x4;  grossly normal neurologically. Skin:  Intact without significant lesions or rashes. Lymph Nodes:  No significant cervical adenopathy. Psych:  Alert and cooperative. Normal mood and affect.   

## 2012-11-17 NOTE — Patient Instructions (Addendum)
Colonoscopy & EGD (upper endoscopy) with Dr Jena Gauss Continue DEXILANT 60mg  daily 1-800-QUIT-NOW for help quitting smoking It would be best to avoid alcohol intake now due to your stomach discomfort  Rectal Bleeding Rectal bleeding is when blood passes out of the anus. It is usually a sign that something is wrong. It may not be serious, but it should always be evaluated. Rectal bleeding may present as bright red blood or extremely dark stools. The color may range from dark red or maroon to black (like tar). It is important that the cause of rectal bleeding be identified so treatment can be started and the problem corrected. CAUSES   Hemorrhoids. These are enlarged (dilated) blood vessels or veins in the anal or rectal area.  Fistulas. Theseare abnormal, burrowing channels that usually run from inside the rectum to the skin around the anus. They can bleed.  Anal fissures. This is a tear in the tissue of the anus. Bleeding occurs with bowel movements.  Diverticulosis. This is a condition in which pockets or sacs project from the bowel wall. Occasionally, the sacs can bleed.  Diverticulitis. Thisis an infection involving diverticulosis of the colon.  Proctitis and colitis. These are conditions in which the rectum, colon, or both, can become inflamed and pitted (ulcerated).  Polyps and cancer. Polyps are non-cancerous (benign) growths in the colon that may bleed. Certain types of polyps turn into cancer.  Protrusion of the rectum. Part of the rectum can project from the anus and bleed.  Certain medicines.  Intestinal infections.  Blood vessel abnormalities. HOME CARE INSTRUCTIONS  Eat a high-fiber diet to keep your stool soft.  Limit activity.  Drink enough fluids to keep your urine clear or pale yellow.  Warm baths may be useful to soothe rectal pain.  Follow up with your caregiver as directed. SEEK IMMEDIATE MEDICAL CARE IF:  You develop increased bleeding.  You have black  or dark red stools.  You vomit blood or material that looks like coffee grounds.  You have abdominal pain or tenderness.  You have a fever.  You feel weak, nauseous, or you faint.  You have severe rectal pain or you are unable to have a bowel movement. MAKE SURE YOU:  Understand these instructions.  Will watch your condition.  Will get help right away if you are not doing well or get worse. Document Released: 05/18/2002 Document Revised: 02/18/2012 Document Reviewed: 05/13/2011 [employer] Patient Information 2013 [place], [place].

## 2012-11-18 ENCOUNTER — Other Ambulatory Visit: Payer: Self-pay | Admitting: Gastroenterology

## 2012-11-19 NOTE — Assessment & Plan Note (Signed)
Colonoscopy w/ Dr Jena Gauss to determine source of dark red hematochezia.  Differentials include ischemic colitis, NSAID-induced enteropathy, colorectal carcinoma, polyp, or diverticular bleeding.  Less likely small bowel source.  I have discussed risks & benefits which include, but are not limited to, bleeding, infection, perforation & drug reaction.  The patient agrees with this plan & written consent will be obtained.     1-800-QUIT-NOW for help quitting smoking It would be best to avoid alcohol intake now due to your stomach discomfort Advised to avoid NSAIDS

## 2012-11-19 NOTE — Assessment & Plan Note (Signed)
Refractory GERD despite daily Dexilant 60mg .  Last EGD showed severe reflux esophagitis.  EGD for further evaluation to look for complicated GERD, gastritis/esophagitis, PUD or malignancy.  I have discussed risks & benefits which include, but are not limited to, bleeding, infection, perforation & drug reaction.  The patient agrees with this plan & written consent will be obtained.    Phenergan 25mg  IV 30 min prior to procedures to augment sedation given hx etoh use daily Continue dexilant 60mg  daily now & this may need to be changed after EGD

## 2012-11-19 NOTE — Progress Notes (Signed)
Faxed to PCP

## 2012-12-01 ENCOUNTER — Encounter (HOSPITAL_COMMUNITY): Payer: Self-pay | Admitting: Pharmacy Technician

## 2012-12-16 ENCOUNTER — Encounter (HOSPITAL_COMMUNITY)
Admission: RE | Disposition: A | Discharge status: Home / Self Care | Payer: Self-pay | Source: Ambulatory Visit | Attending: Internal Medicine

## 2012-12-16 ENCOUNTER — Ambulatory Visit (HOSPITAL_COMMUNITY)
Admission: RE | Admit: 2012-12-16 | Discharge: 2012-12-16 | Disposition: A | Discharge status: Home / Self Care | Payer: No Typology Code available for payment source | Source: Ambulatory Visit | Attending: Internal Medicine | Admitting: Internal Medicine

## 2012-12-16 ENCOUNTER — Encounter (HOSPITAL_COMMUNITY): Payer: Self-pay

## 2012-12-16 DIAGNOSIS — K2100 Gastro-esophageal reflux disease with esophagitis, without bleeding: Secondary | ICD-10-CM | POA: Insufficient documentation

## 2012-12-16 DIAGNOSIS — Z8371 Family history of colonic polyps: Secondary | ICD-10-CM

## 2012-12-16 DIAGNOSIS — K219 Gastro-esophageal reflux disease without esophagitis: Secondary | ICD-10-CM

## 2012-12-16 DIAGNOSIS — K648 Other hemorrhoids: Secondary | ICD-10-CM

## 2012-12-16 DIAGNOSIS — D126 Benign neoplasm of colon, unspecified: Secondary | ICD-10-CM | POA: Insufficient documentation

## 2012-12-16 DIAGNOSIS — K921 Melena: Secondary | ICD-10-CM

## 2012-12-16 DIAGNOSIS — K449 Diaphragmatic hernia without obstruction or gangrene: Secondary | ICD-10-CM | POA: Insufficient documentation

## 2012-12-16 DIAGNOSIS — K5730 Diverticulosis of large intestine without perforation or abscess without bleeding: Secondary | ICD-10-CM

## 2012-12-16 DIAGNOSIS — I10 Essential (primary) hypertension: Secondary | ICD-10-CM | POA: Insufficient documentation

## 2012-12-16 DIAGNOSIS — Z83719 Family history of colon polyps, unspecified: Secondary | ICD-10-CM | POA: Insufficient documentation

## 2012-12-16 HISTORY — DX: Shortness of breath: R06.02

## 2012-12-16 HISTORY — PX: COLONOSCOPY WITH ESOPHAGOGASTRODUODENOSCOPY (EGD): SHX5779

## 2012-12-16 SURGERY — COLONOSCOPY WITH ESOPHAGOGASTRODUODENOSCOPY (EGD)
Anesthesia: Moderate Sedation

## 2012-12-16 MED ORDER — STERILE WATER FOR IRRIGATION IR SOLN
Status: DC | PRN
  Administered 2012-12-16: 08:00:00

## 2012-12-16 MED ORDER — BUTAMBEN-TETRACAINE-BENZOCAINE 2-2-14 % EX AERO
INHALATION_SPRAY | CUTANEOUS | Status: DC | PRN
  Administered 2012-12-16: 2 via TOPICAL

## 2012-12-16 MED ORDER — SODIUM CHLORIDE 0.9 % IJ SOLN
INTRAMUSCULAR | Status: AC
  Filled 2012-12-16: qty 10

## 2012-12-16 MED ORDER — SODIUM CHLORIDE 0.45 % IV SOLN
INTRAVENOUS | Status: DC
  Administered 2012-12-16: 07:00:00 via INTRAVENOUS

## 2012-12-16 MED ORDER — MIDAZOLAM HCL 5 MG/5ML IJ SOLN
INTRAMUSCULAR | Status: AC
  Filled 2012-12-16: qty 10

## 2012-12-16 MED ORDER — MIDAZOLAM HCL 5 MG/5ML IJ SOLN
INTRAMUSCULAR | Status: DC | PRN
  Administered 2012-12-16: 1 mg via INTRAVENOUS
  Administered 2012-12-16: 2 mg via INTRAVENOUS
  Administered 2012-12-16 (×3): 1 mg via INTRAVENOUS
  Administered 2012-12-16: 2 mg via INTRAVENOUS

## 2012-12-16 MED ORDER — MEPERIDINE HCL 100 MG/ML IJ SOLN
INTRAMUSCULAR | Status: DC | PRN
  Administered 2012-12-16 (×2): 50 mg via INTRAVENOUS

## 2012-12-16 MED ORDER — PROMETHAZINE HCL 25 MG/ML IJ SOLN
INTRAMUSCULAR | Status: AC
  Filled 2012-12-16: qty 1

## 2012-12-16 MED ORDER — MEPERIDINE HCL 100 MG/ML IJ SOLN
INTRAMUSCULAR | Status: AC
  Filled 2012-12-16: qty 2

## 2012-12-16 MED ORDER — PROMETHAZINE HCL 25 MG/ML IJ SOLN
25.0000 mg | Freq: Once | INTRAMUSCULAR | Status: AC
  Administered 2012-12-16: 25 mg via INTRAVENOUS

## 2012-12-16 NOTE — Interval H&P Note (Signed)
History and Physical Interval Note:  12/16/2012 7:33 AM  Robyn Haber  has presented today for surgery, with the diagnosis of Refractory GERD and Hematochezia  The various methods of treatment have been discussed with the patient and family. After consideration of risks, benefits and other options for treatment, the patient has consented to  Procedure(s) (LRB) with comments: COLONOSCOPY WITH ESOPHAGOGASTRODUODENOSCOPY (EGD) (N/A) - 7:30 as a surgical intervention .  The patient's history has been reviewed, patient examined, no change in status, stable for surgery.  I have reviewed the patient's chart and labs.  Questions were answered to the patient's satisfaction.     Eula Listen  As above. Patient has gained 20 pounds in the past year. No dysphagia. EGD and colonoscopy per plan. The risks, benefits, limitations, imponderables and alternatives regarding both EGD and colonoscopy have been reviewed with the patient. Questions have been answered. All parties agreeable.

## 2012-12-16 NOTE — Op Note (Signed)
[facility] [address] [place], [address]   COLONOSCOPY PROCEDURE REPORT  PATIENT: Bradley, Miles  MR#:         [id number] BIRTHDATE: 1966-12-01 , 46  yrs. old GENDER: Male ENDOSCOPIST: R.  Roetta Sessions, MD FACP FACG REFERRED BY:  Simone Curia, M.D. PROCEDURE DATE:  12/16/2012 PROCEDURE:     Ileocolonoscopy with biopsy and snare polypectomy  INDICATIONS: hematochezia; positive family history of colonic adenoma  INFORMED CONSENT:  The risks, benefits, alternatives and imponderables including but not limited to bleeding, perforation as well as the possibility of a missed lesion have been reviewed.  The potential for biopsy, lesion removal, etc. have also been discussed.  Questions have been answered.  All parties agreeable. Please see the history and physical in the medical record for more information.  MEDICATIONS: Phenergan 25 mg IV and Versed 8 mg IV and Demerol 100 mg IV in divided doses.  DESCRIPTION OF PROCEDURE:  After a digital rectal exam was performed, the EC-[id number] ([id number])  colonoscope was advanced from the anus through the rectum and colon to the area of the cecum, ileocecal valve and appendiceal orifice.  The cecum was deeply intubated.  These structures were well-seen and photographed for the record.  From the level of the cecum and ileocecal valve, the scope was slowly and cautiously withdrawn.  The mucosal surfaces were carefully surveyed utilizing scope tip deflection to facilitate fold flattening as needed.  The scope was pulled down into the rectum where a thorough examination including retroflexion was performed.    FINDINGS:  Adequate preparation. Friable internal hemorrhoids; otherwise normal rectum. Pancolonic diverticulosis. (1) 4 mm polyp in the mid descending segment. There was one other diminutive polyp in the mid sigmoid segment; otherwise, the remainder of colonic mucosa appeared normal. The distal 10 cm of terminal  ileum mucosa appeared normal.  THERAPEUTIC / DIAGNOSTIC MANEUVERS PERFORMED:  The above-mentioned polyps were cold snare and cold biopsy removed, respectively.  COMPLICATIONS: None  CECAL WITHDRAWAL TIME:  12 minutes  IMPRESSION:  Internal hemorrhoids-likely source of hematochezia. Colonic diverticulosis. Colonic polyps-removed as described above  RECOMMENDATIONS: Followup on pathology. Daily fiber supplement such as Benefiber-2 tablespoons. Course of Anusol suppositories. See EGD report.   _______________________________ eSigned:  R. Roetta Sessions, MD FACP [facility] 12/16/2012 8:35 AM   CC:    PATIENT NAME:  Bradley, Miles MR#: [id number]

## 2012-12-16 NOTE — H&P (View-Only) (Signed)
.Referring Provider: Merlyn Albert, MD Primary Care Physician:  Harlow Asa, MD Primary Gastroenterologist:  Dr. Jena Gauss  Chief Complaint  Patient presents with  . Colonoscopy  . Heartburn    HPI:  Bradley Miles is a 47 y.o. male here to set up colonoscopy & GERD.  He denies lower abdominal pain.  He gives family hx of 2 sisters with multiple adenomatous colon polyps.  He has been advised to have a colonoscopy.  He has had heartburn & regurgitation that has been worse over the last month.  He started Dexilant 60mg  daily years ago.  Occasionally skipping doses.  He works rotating shifts.  He has seen small amounts of dark red blood in his stool 2 weeks ago.  He takes a couple Ibuprofen prn couple times per week for headaches.  He drinks etoh almost daily.  Denies dysphagia or odynophagia.  Abdominal discomfort is better with passing flatus.  Last 2010 EGD by Dr Jena Gauss showed severe reflux esophagitis, Schatzki ring, small hiatal hernia. His esophagus was dilated.  Primary Care Physician:  Harlow Asa, MD Primary Gastroenterologist:  Dr.   Antony Contras Complaint  Patient presents with  . Colonoscopy  . Heartburn    HPI:  Bradley Miles is a 47 y.o. male here as a new patient for evaluation of   Past Medical History  Diagnosis Date  . Hypertension   . GERD (gastroesophageal reflux disease)   . Anxiety   . Palpitations     Past Surgical History  Procedure Date  . Esophagogastroduodenoscopy 05/19/09    RMR: Geographic distal esophageal erosions consistent with severe erosive reflux esophagitis. schatzki's ring s/P dilation/small hiatal hernia otherwise normal stomach  . Cyst reomved     from spine    Current Outpatient Prescriptions  Medication Sig Dispense Refill  . acebutolol (SECTRAL) 400 MG capsule Take 1 capsule (400 mg total) by mouth 2 (two) times daily.  180 capsule  3  . atorvastatin (LIPITOR) 40 MG tablet Take 1 tablet (40 mg total) by mouth daily.  30 tablet  6  .  dexlansoprazole (DEXILANT) 60 MG capsule Take 1 capsule (60 mg total) by mouth daily.  31 capsule  11  . zolpidem (AMBIEN) 5 MG tablet Take 5 mg by mouth at bedtime as needed.        Allergies as of 11/17/2012  . (No Known Allergies)    Family History  Problem Relation Age of Onset  . Cancer Mother     ?etiology  . Liver disease Neg Hx   . Colon polyps Sister 16    two sisters    History   Social History  . Marital Status: Single    Spouse Name: N/A    Number of Children: 2  . Years of Education: N/A   Occupational History  . [employer]   Social History Main Topics  . Smoking status: Current Every Day Smoker -- 1.0 packs/day for 30 years    Types: Cigarettes  . Smokeless tobacco: Not on file  . Alcohol Use: Yes     Comment: consumes beer or liquor three to four times weekly. Up to six pack or three shots each occurence or more x 20+ yrs  . Drug Use: Not on file  . Sexually Active: Yes   Other Topics Concern  . Not on file   Social History Narrative   Lives alone  Girlfriend & kids 1/2 time    Review  of Systems: Gen: Denies any fever, chills, sweats, anorexia, fatigue, weakness, malaise, weight loss, and sleep disorder CV: Denies chest pain, angina, palpitations, syncope, orthopnea, PND, peripheral edema, and claudication. Resp: Denies dyspnea at rest, dyspnea with exercise, cough, sputum, wheezing, coughing up blood, and pleurisy. GI: Denies vomiting blood, jaundice, and fecal incontinence.   Denies dysphagia or odynophagia. GU : Denies urinary burning, blood in urine, urinary frequency, urinary hesitancy, nocturnal urination, and urinary incontinence. MS: Denies joint pain, limitation of movement, and swelling, stiffness, low back pain, extremity pain. Denies muscle weakness, cramps, atrophy.  Derm: Denies rash, itching, dry skin, hives, moles, warts, or unhealing ulcers.  Psych: Denies depression, anxiety, memory loss, suicidal  ideation, hallucinations, paranoia, and confusion. Heme: Denies bruising, bleeding, and enlarged lymph nodes. Neuro:  Denies any headaches, dizziness, paresthesias. Endo:  Denies any problems with DM, thyroid, adrenal function.  Physical Exam: BP 127/76  Pulse 81  Temp 98.4 F (36.9 C) (Oral)  Ht 6\' 1"  (1.854 m)  Wt 257 lb 9.6 oz (116.847 kg)  BMI 33.99 kg/m2 No LMP for male patient. General:   Alert,  Well-developed, obese, pleasant and cooperative in NAD Head:  Normocephalic and atraumatic. Eyes:  Sclera clear, no icterus.   Conjunctiva pink. Ears:  Normal auditory acuity. Nose:  No deformity, discharge, or lesions. Mouth:  No deformity or lesions,oropharynx pink & moist. Neck:  Supple; no masses or thyromegaly. Lungs:  Clear throughout to auscultation.   No wheezes, crackles, or rhonchi. No acute distress. Heart:  Regular rate and rhythm; no murmurs, clicks, rubs,  or gallops. Abdomen: Protuberant.  Normal bowel sounds.  No bruits.  Soft, non-tender and non-distended without masses, hepatosplenomegaly or hernias noted.  No guarding or rebound tenderness.   Rectal:  Deferred. Msk:  Symmetrical without gross deformities. Normal posture. Pulses:  Normal pulses noted. Extremities:  No clubbing or edema. Neurologic:  Alert and  oriented x4;  grossly normal neurologically. Skin:  Intact without significant lesions or rashes. Lymph Nodes:  No significant cervical adenopathy. Psych:  Alert and cooperative. Normal mood and affect.

## 2012-12-16 NOTE — Op Note (Signed)
[facility] [address] [place], [address]   ENDOSCOPY PROCEDURE REPORT  PATIENT: Bradley Miles, Bradley Miles  MR#: [id number] BIRTHDATE: 08/01/1966 , 46  yrs. old GENDER: Male ENDOSCOPIST: R.  Roetta Sessions, MD FACP FACG REFERRED BY:  Simone Curia, M.D. PROCEDURE DATE:  12/16/2012 PROCEDURE:     EGD with gastric biopsy  INDICATIONS:     Recurrent reflux symptoms previously controlled on Dexilant 60 mg orally daily in a setting of a recent 20 pound weight gain, ongoing tobacco and alcohol use.  INFORMED CONSENT:   The risks, benefits, limitations, alternatives and imponderables have been discussed.  The potential for biopsy, esophogeal dilation, etc. have also been reviewed.  Questions have been answered.  All parties agreeable.  Please see the history and physical in the medical record for more information.  MEDICATIONS:     Versed 5 mg IV in Demerol 100 mg. Phenergan 25 mg. Cetacaine spray.  DESCRIPTION OF PROCEDURE:   The Pentax Gastroscope [id number] endoscope was introduced through the mouth and advanced to the second portion of the duodenum without difficulty or limitations. The mucosal surfaces were surveyed very carefully during advancement of the scope and upon withdrawal.  Retroflexion view of the proximal stomach and esophagogastric junction was performed.      FINDINGS: Couple of distal tiny esophageal erosions. No Barrett's esophagus. No ring. Tubular soft is widely patent throughout its course. Stomach empty. Small hiatal hernia. Patchy, mild erythema of the entire gastric mucosa. No ulcer or infiltrating process. Pylorus patent.  THERAPEUTIC / DIAGNOSTIC MANEUVERS PERFORMED:  Biopsies of the abnormal gastric mucosa taken for histologic study   COMPLICATIONS:  None  IMPRESSION:   Mild erosive reflux esophagitis. Small hiatal hernia. Abnormal gastric mucosa of doubtful clinical significance-status post biopsy  Ongoing alcohol and tobacco use  predisposes him to reflux disease. Symptoms previously controlled on Dexilant 60 mg orally daily. I suspect recent 20 pound weight gain has  tipped  the scales in favor of reflux.  RECOMMENDATIONS:     Continue Dexilant 60 mg daily. Followup on pathology. Weight loss. Regular exercise.Discussed  the negative effects of alcohol and tobacco exposure at some length. I'll likely modify his pharmacological regimen for reflux once I have pathology in hand to review.  See colonoscopy report   _______________________________ R. Roetta Sessions, MD FACP [facility] eSigned:  R. Roetta Sessions, MD FACP [facility] 12/16/2012 8:06 AM     CC:  PATIENT NAME:  Keygan, Dumond MR#: [id number]

## 2012-12-18 ENCOUNTER — Encounter (HOSPITAL_COMMUNITY): Payer: Self-pay | Admitting: Internal Medicine

## 2012-12-18 ENCOUNTER — Encounter: Payer: Self-pay | Admitting: *Deleted

## 2012-12-18 ENCOUNTER — Encounter: Payer: Self-pay | Admitting: Internal Medicine

## 2013-03-05 ENCOUNTER — Other Ambulatory Visit: Payer: Self-pay | Admitting: Adult Health

## 2013-03-13 ENCOUNTER — Ambulatory Visit: Payer: No Typology Code available for payment source | Admitting: Adult Health

## 2013-03-19 ENCOUNTER — Ambulatory Visit: Payer: No Typology Code available for payment source | Admitting: Adult Health

## 2013-03-23 ENCOUNTER — Encounter (INDEPENDENT_AMBULATORY_CARE_PROVIDER_SITE_OTHER): Payer: No Typology Code available for payment source | Admitting: Adult Health

## 2013-03-23 DIAGNOSIS — R002 Palpitations: Secondary | ICD-10-CM

## 2013-03-23 NOTE — Progress Notes (Signed)
   HPI Bradley Miles is a 47 year old patient of Dr. [facility] Bing we are following on ongoing assessment and management of applications, hypertension, with ongoing tobacco abuse. He was last seen in the office in March of 2013. The patient was unable to tolerate Benicar HCTZ do to significant degree. He will also was placed on acebutolol but did not take it regularly and therefore this was discontinued. The patient continues to smoke drink alcohol and had heavy intake of caffeine.  No Known Allergies  Current Outpatient Prescriptions  Medication Sig Dispense Refill  . acebutolol (SECTRAL) 400 MG capsule take 1 capsule by mouth twice a day  180 capsule  0  . DEXILANT 60 MG capsule take 1 capsule once daily  30 capsule  11   No current facility-administered medications for this visit.    Past Medical History  Diagnosis Date  . Hypertension   . GERD (gastroesophageal reflux disease)   . Anxiety   . Palpitations   . Shortness of breath     Past Surgical History  Procedure Laterality Date  . Esophagogastroduodenoscopy  05/19/09    RMR: Geographic distal esophageal erosions consistent with severe erosive reflux esophagitis. schatzki's ring s/P dilation/small hiatal hernia otherwise normal stomach  . Cyst reomved      from spine  . Wisdom tooth extraction    . Colonoscopy with esophagogastroduodenoscopy (egd)  12/16/2012    Procedure: COLONOSCOPY WITH ESOPHAGOGASTRODUODENOSCOPY (EGD);  Surgeon: Corbin Ade, MD;  [facility];  Service: Endoscopy;  Laterality: N/A;  7:30    ROS: PHYSICAL EXAM There were no vitals taken for this visit.  EKG:  ASSESSMENT AND PLAN

## 2013-05-07 ENCOUNTER — Other Ambulatory Visit: Payer: Self-pay | Admitting: Family Medicine

## 2013-07-01 ENCOUNTER — Ambulatory Visit (INDEPENDENT_AMBULATORY_CARE_PROVIDER_SITE_OTHER): Payer: No Typology Code available for payment source | Admitting: Nurse Practitioner

## 2013-07-01 ENCOUNTER — Encounter: Payer: Self-pay | Admitting: Nurse Practitioner

## 2013-07-01 ENCOUNTER — Other Ambulatory Visit: Payer: Self-pay

## 2013-07-01 ENCOUNTER — Encounter: Payer: Self-pay | Admitting: Family Medicine

## 2013-07-01 ENCOUNTER — Ambulatory Visit (HOSPITAL_COMMUNITY)
Admission: RE | Admit: 2013-07-01 | Discharge: 2013-07-01 | Disposition: A | Discharge status: Home / Self Care | Payer: No Typology Code available for payment source | Source: Ambulatory Visit | Attending: Nurse Practitioner | Admitting: Nurse Practitioner

## 2013-07-01 ENCOUNTER — Telehealth: Payer: Self-pay | Admitting: Adult Health

## 2013-07-01 VITALS — BP 136/90 | Temp 97.7°F | Wt 262.1 lb

## 2013-07-01 DIAGNOSIS — I82819 Embolism and thrombosis of superficial veins of unspecified lower extremities: Secondary | ICD-10-CM | POA: Insufficient documentation

## 2013-07-01 DIAGNOSIS — I83892 Varicose veins of left lower extremities with other complications: Secondary | ICD-10-CM

## 2013-07-01 DIAGNOSIS — R600 Localized edema: Secondary | ICD-10-CM

## 2013-07-01 DIAGNOSIS — I809 Phlebitis and thrombophlebitis of unspecified site: Secondary | ICD-10-CM

## 2013-07-01 DIAGNOSIS — R609 Edema, unspecified: Secondary | ICD-10-CM

## 2013-07-01 DIAGNOSIS — I82812 Embolism and thrombosis of superficial veins of left lower extremities: Secondary | ICD-10-CM

## 2013-07-01 DIAGNOSIS — M79662 Pain in left lower leg: Secondary | ICD-10-CM

## 2013-07-01 DIAGNOSIS — I83893 Varicose veins of bilateral lower extremities with other complications: Secondary | ICD-10-CM

## 2013-07-01 DIAGNOSIS — M79609 Pain in unspecified limb: Secondary | ICD-10-CM | POA: Insufficient documentation

## 2013-07-01 LAB — D-DIMER, QUANTITATIVE: D-Dimer, Quant: 0.28 ug/mL-FEU (ref 0.00–0.48)

## 2013-07-01 MED ORDER — NABUMETONE 750 MG PO TABS: 750.0000 mg | ORAL_TABLET | Freq: Two times a day (BID) | ORAL | Status: DC | PRN

## 2013-07-01 MED ORDER — CEPHALEXIN 500 MG PO CAPS: 500.0000 mg | ORAL_CAPSULE | Freq: Three times a day (TID) | ORAL | Status: DC

## 2013-07-01 MED ORDER — ACEBUTOLOL HCL 400 MG PO CAPS: ORAL_CAPSULE | ORAL | Status: DC

## 2013-07-01 NOTE — Telephone Encounter (Signed)
Medication sent via escribe for acebutolol

## 2013-07-01 NOTE — Assessment & Plan Note (Signed)
.   cephALEXin (KEFLEX) 500 MG capsule    Sig: Take 1 capsule (500 mg total) by mouth 3 (three) times daily.    Dispense:  21 capsule    Refill:  0    Order Specific Question:  Supervising Provider    Answer:  Merlyn Albert [[id number]]  . nabumetone (RELAFEN) 750 MG tablet    Sig: Take 1 tablet (750 mg total) by mouth 2 (two) times daily as needed for pain.    Dispense:  30 tablet    Refill:  0    Order Specific Question:  Supervising Provider    Answer:  Merlyn Albert [[id number]]   Repeat venous ultrasound left calf in one week. Patient to call back sooner if symptoms worsen.

## 2013-07-01 NOTE — Telephone Encounter (Signed)
Patient has made f/u appt / please send his refills in to [facility] in [place] / [place]

## 2013-07-01 NOTE — Progress Notes (Signed)
Subjective:  Presents for complaints of swelling and pain in the left calf area that began about a week ago. Symptoms began to get worse 4 days ago. Had a slight temperature to the area. Symptoms much better. No fever. No shortness of breath. Occasional slight pain with deep breath or cough on the left upper anterior chest. No severe pain. History of varicose veins. Smoker. History of hyperlipidemia.  Objective:   BP 136/90  Temp(Src) 97.7 F (36.5 C) (Oral)  Wt 262 lb 2 oz (118.899 kg)  BMI 34.59 kg/m2 NAD. Alert, oriented. Lungs clear. Heart regular rate rhythm. Calf circumference no significant difference between legs. Mild edema noted in the left upper calf area towards the lateral side. Skin fairly firm. Minimal color. Minimal tenderness. Very firm ropy continuous varicose vein noted going from the upper calf area down to lower calf area. Gait normal limit. Stat d-dimer was normal. Venous ultrasound showed some superficial thrombi, no deeper thrombi. Results reviewed with Dr. Brett Canales.  Assessment:Calf pain, left - Plan: D-dimer, quantitative, US Venous Img Lower Unilateral Left  Leg edema, left  Varicose veins of left leg with edema  Plan: Meds ordered this encounter  Medications  . acebutolol (SECTRAL) 400 MG capsule    Sig: Take 400 mg by mouth 2 (two) times daily.  . cephALEXin (KEFLEX) 500 MG capsule    Sig: Take 1 capsule (500 mg total) by mouth 3 (three) times daily.    Dispense:  21 capsule    Refill:  0    Order Specific Question:  Supervising Provider    Answer:  Merlyn Albert [[id number]]  . nabumetone (RELAFEN) 750 MG tablet    Sig: Take 1 tablet (750 mg total) by mouth 2 (two) times daily as needed for pain.    Dispense:  30 tablet    Refill:  0    Order Specific Question:  Supervising Provider    Answer:  Merlyn Albert [[id number]]   Repeat venous ultrasound left calf in one week. Patient to call back sooner if symptoms worsen.

## 2013-07-02 ENCOUNTER — Emergency Department (HOSPITAL_COMMUNITY)
Admission: EM | Admit: 2013-07-02 | Discharge: 2013-07-02 | Disposition: A | Discharge status: Home / Self Care | Payer: No Typology Code available for payment source | Source: Home / Self Care

## 2013-07-02 ENCOUNTER — Encounter (HOSPITAL_COMMUNITY): Payer: Self-pay | Admitting: Emergency Medicine

## 2013-07-02 DIAGNOSIS — I82819 Embolism and thrombosis of superficial veins of unspecified lower extremities: Secondary | ICD-10-CM

## 2013-07-02 DIAGNOSIS — I82812 Embolism and thrombosis of superficial veins of left lower extremities: Secondary | ICD-10-CM

## 2013-07-02 DIAGNOSIS — R002 Palpitations: Secondary | ICD-10-CM

## 2013-07-02 MED ORDER — MEDICAL COMPRESSION STOCKINGS MISC: 1.0000 | Freq: Every day | Status: DC

## 2013-07-02 NOTE — ED Notes (Signed)
Patient did not pick up medicine last night

## 2013-07-02 NOTE — ED Notes (Signed)
Patient reports pain , swelling, redness in left lower leg.  Visible red, swollen area "C" shaped.  Onset Saturday of leg swelling, then noticed painful area, and pain has increased over the last 2 days.  Seen at [facility] yesterday, but patient not clear on diagnosis. Patient mentions heart racing last night, history of the same, the only difference is it last about 20 minutes, this is longer than usual.

## 2013-07-02 NOTE — ED Provider Notes (Signed)
Bradley Miles is a 47 y.o. male who presents to Urgent Care today for  1) left lower extremity swelling with palpable cord. Patient developed left lower chin the swelling and was evaluated in the emergency room yesterday. A duplex ultrasound examination of his lower leg reveals superficial thrombus with an absence of DVT. He was prescribed Keflex and rate less than which he has not yet picked up. He notes that his pain in his lower extremity has worsened and he notes the palpable cord has extended approximately 3 or 4 cm approaching the knee. He denies any chest pains or trouble breathing. He was prescribed Keflex and railfan however has not started these medications yet. He has not obtained a compression stocking yet.   2) additionally however patient notes an episode of palpitations with some lightheadedness last night. This is a slightly worse example of a chronic problem. Patient has had multiple episodes of palpitations and lightheadedness over the last several years. He has had it evaluated in the past with a Holter monitor and notes that they found a benign arrhythmia. He has an appointment with cardiologist in one week. He denies any chest pains or trouble breathing with this episode. It lasted about 20 minutes.    PMH reviewed. As above  History  Substance Use Topics  . Smoking status: Current Every Day Smoker -- 1.50 packs/day for 30 years    Types: Cigarettes  . Smokeless tobacco: Not on file  . Alcohol Use: Yes     Comment: consumes beer or liquor three to four times weekly. Up to six pack or three shots each occurence or more x 20+ yrs   ROS as above Medications reviewed. No current facility-administered medications for this encounter.   Current Outpatient Prescriptions  Medication Sig Dispense Refill  . acebutolol (SECTRAL) 400 MG capsule Take 400 mg by mouth 2 (two) times daily.      . cephALEXin (KEFLEX) 500 MG capsule Take 1 capsule (500 mg total) by mouth 3 (three) times  daily.  21 capsule  0  . DEXILANT 60 MG capsule take 1 capsule once daily  30 capsule  11  . nabumetone (RELAFEN) 750 MG tablet Take 1 tablet (750 mg total) by mouth 2 (two) times daily as needed for pain.  30 tablet  0  . VIAGRA 100 MG tablet TAKE 1/2 TABLET BY MOUTH AS NEEDED  3 tablet  6    Exam:  BP 127/93  Pulse 70  Temp(Src) 98.1 F (36.7 C) (Oral)  Resp 16  SpO2 99% Gen: Well NAD HEENT: EOMI,  MMM Lungs: CTABL Nl WOB Heart: RRR no MRG Abd: NABS, NT, ND Exts: warm and well perfused.  Left calf with palpable cord mid calf to 2 inches distal to knee. Mild erythremia and tenderness.   Results for orders placed in visit on 07/01/13 (from the past 24 hour(s))  D-DIMER, QUANTITATIVE     Status: None   Collection Time    07/01/13 11:35 AM      Result Value Range   D-Dimer, Quant 0.28  0.00 - 0.48 ug/mL-FEU   Narrative:    Performed at:  [facility]                [address] [place], [place] [address]   US Venous Img Lower Unilateral Left  07/01/2013   *RADIOLOGY REPORT*  Left lower extremity venous duplex  ultrasound  History:  Left calf region pain and redness  Technique:  Real-time and Doppler interrogation of the left lower extremity venous system was performed.  Findings:  In the left lower extremity deep system, flow is spontaneous and phasic in all segments.  There is normal compression and augmentation in the venous structures of the deep system of the left lower extremity. Venous Doppler is normal throughout the deep venous structures of left lower extremity. There is no thrombus in the deep venous structures of the left lower extremity.  There is no deep venous incompetence on the left.  The saphenofemoral junction region appears normal on the left. Visualized portions of the greater saphenous vein appear normal without thrombus.  There are several dilated varices in the calf region on the left which contain acute thrombus.  These are all superficial  thrombi.  Conclusion:  Superficial acute venous thrombi are present in multiple dilated varices in the left calf region.  No deep venous thrombosis is seen on this study.   Original Report Authenticated By: Bretta Bang, M.D.   12 lead ekg: NSR @66  bpm. No ST changes. Normal EKG  Assessment and Plan: 47 y.o. male with  1) ultrasound confirmed superficial thrombophlebitis. Not significantly worse in one day.  Plan: Encouraged patient to fill and take the rate less than and Keflex that was prescribed yesterday Additionally prescribed thigh-high compression stocking and encourage patient to elevate his lower extremity. Work note provided/   2) palpitations: Exacerbation of chronic problem. EKG is normal today and patient has a followup appointment with his cardiologist in one week.  Will followup with cardiologist in one week. Avoid caffeine and nicotine if possible.  Encourage tobacco cessation.   Discussed warning signs or symptoms. Please see discharge instructions. Patient expresses understanding.     Rodolph Bong, MD 07/02/13 [phone]

## 2013-07-09 ENCOUNTER — Ambulatory Visit (HOSPITAL_COMMUNITY): Payer: No Typology Code available for payment source

## 2013-07-14 ENCOUNTER — Ambulatory Visit (INDEPENDENT_AMBULATORY_CARE_PROVIDER_SITE_OTHER): Payer: No Typology Code available for payment source | Admitting: Adult Health

## 2013-07-14 ENCOUNTER — Encounter: Payer: Self-pay | Admitting: Adult Health

## 2013-07-14 VITALS — BP 146/86 | HR 81 | Ht 73.0 in | Wt 246.0 lb

## 2013-07-14 DIAGNOSIS — R002 Palpitations: Secondary | ICD-10-CM

## 2013-07-14 DIAGNOSIS — I82812 Embolism and thrombosis of superficial veins of left lower extremities: Secondary | ICD-10-CM

## 2013-07-14 DIAGNOSIS — I80299 Phlebitis and thrombophlebitis of other deep vessels of unspecified lower extremity: Secondary | ICD-10-CM

## 2013-07-14 DIAGNOSIS — I82819 Embolism and thrombosis of superficial veins of unspecified lower extremities: Secondary | ICD-10-CM

## 2013-07-14 DIAGNOSIS — F17200 Nicotine dependence, unspecified, uncomplicated: Secondary | ICD-10-CM

## 2013-07-14 NOTE — Assessment & Plan Note (Signed)
He continues to have medical noncompliance with use of acebutelol. I have asked him to take the medication as directed so that he will not have recurrence of palpitations. He states that when he forgets to take the medication, he notices that his palpitations get worse. I have provided a Rx renewal for BB.

## 2013-07-14 NOTE — Progress Notes (Signed)
HPI: Bradley Miles is a 47 year old patient of Dr. Sandy Salaam we are following for ongoing assessment and management of palpitations, PVCs, hypertension, with ongoing tobacco abuse. He was last seen in the office in February 2013 and was mildly hypertensive on that visit. 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    The patient was seen in urgent care on 07/02/2013 lower extremity edema of the left leg with a palpable cord. The patient had duplex ultrasound demonstrating superficial thrombus with an absent of DVT. He was prescribed Keflex and, and compression stockings. Unfortunately he did not stop smoking.  He also continues to complain of palpitations. He is here for continued evaluation of his frequent palpitations. Patient has a history of medical noncompliance, and is not taking Acebutalol or Benicar as directed. He states that the Benicar causing the cough and pass out.   He admits to medical noncompliance with medications,with increased palpitations, and not  wearing support hose in the setting of thrombophlebitis. He states that he is finishing up his antibiotics. He is feeling more of bruise-like pain in the left upper thigh, but the left lower leg is less painful.   No Known Allergies  Current Outpatient Prescriptions  Medication Sig Dispense Refill  . acebutolol (SECTRAL) 400 MG capsule Take 400 mg by mouth 2 (two) times daily.      . cephALEXin (KEFLEX) 500 MG capsule Take 1 capsule (500 mg total) by mouth 3 (three) times daily.  21 capsule  0  . DEXILANT 60 MG capsule take 1 capsule once daily  30 capsule  11  . Elastic Bandages & Supports (MEDICAL COMPRESSION STOCKINGS) MISC 1 Device by Does not apply route daily. Thigh high compression stocking for superficial thrombophlebitis 451.9 left leg  1 each  0  .  nabumetone (RELAFEN) 750 MG tablet Take 1 tablet (750 mg total) by mouth 2 (two) times daily as needed for pain.  30 tablet  0  . VIAGRA 100 MG tablet TAKE 1/2 TABLET BY MOUTH AS NEEDED  3 tablet  6   No current facility-administered medications for this visit.    Past Medical History  Diagnosis Date  . Hypertension   . GERD (gastroesophageal reflux disease)   . Anxiety   . Palpitations   . Shortness of breath     Past Surgical History  Procedure Laterality Date  . Esophagogastroduodenoscopy  05/19/09    RMR: Geographic distal esophageal erosions consistent with severe erosive reflux esophagitis. schatzki's ring s/P dilation/small hiatal hernia otherwise normal stomach  . Cyst reomved      from spine  . Wisdom tooth extraction    . Colonoscopy with esophagogastroduodenoscopy (egd)  12/16/2012    Procedure: COLONOSCOPY WITH ESOPHAGOGASTRODUODENOSCOPY (EGD);  Surgeon: Corbin Ade, MD;  Location: [facility];  Service: Endoscopy;  Laterality: N/A;  7:30    ZOX:WRUEAV of systems complete and found to be negative unless listed above  PHYSICAL EXAM BP 146/86  Pulse 81  Ht 6\' 1"  (1.854 m)  Wt 246 lb (111.585 kg)  BMI 32.46 kg/m2  SpO2 98%  General: Well developed, well nourished, in no acute distress Head: Eyes PERRLA, No xanthomas.   Normal cephalic and atramatic  Lungs: Clear bilaterally to auscultation and percussion. Heart: HRRR S1 S2, without MRG.  Pulses are 2+ & equal.  No carotid bruit. No JVD.  No abdominal bruits.  Abdomen: Bowel sounds are positive, abdomen soft and non-tender without masses or                  Hernia's noted. Msk:  Back normal, normal gait. Normal strength and tone for age. Extremities: No clubbing, cyanosis or edema.  Left leg has rope-like distention of superficial veins, with loss of hair, with some tenderness in the left upper extremity, no warmth or edema. DP +1 Neuro: Alert and oriented X 3. Psych:  Good affect, responds  appropriately    ASSESSMENT AND PLAN

## 2013-07-14 NOTE — Assessment & Plan Note (Signed)
He is counseled on smoking cessation. He verbalizes understanding.

## 2013-07-14 NOTE — Assessment & Plan Note (Signed)
He is feeling some better in the left lower extremity but continues soreness in the upper left thigh. He is to be placed on ECASA 81 mg daily. He is to wear compression hose as directed. I will repeat venous  doppler U/S of left leg to ascertain for continued thrombus.

## 2013-07-14 NOTE — Progress Notes (Deleted)
Name: Bradley Miles    DOB: 09-02-1966  Age: 47 y.o.  MR#: [id number]       PCP:  Harlow Asa, MD      Insurance: Payor: [employer] / Plan: Joette Catching / Product Type: *No Product type* /   CC:    Chief Complaint  Patient presents with  . Appointment    VS Filed Vitals:   07/14/13 1529  BP: 146/86  Pulse: 81  Height: 6\' 1"  (1.854 m)  Weight: 246 lb (111.585 kg)  SpO2: 98%    Weights Current Weight  07/14/13 246 lb (111.585 kg)  07/01/13 262 lb 2 oz (118.899 kg)  12/16/12 257 lb (116.574 kg)    Blood Pressure  BP Readings from Last 3 Encounters:  07/14/13 146/86  07/02/13 127/93  07/01/13 136/90     Admit date:  (Not on file) Last encounter with RMR:  07/01/2013   Allergy Review of patient's allergies indicates no known allergies.  Current Outpatient Prescriptions  Medication Sig Dispense Refill  . acebutolol (SECTRAL) 400 MG capsule Take 400 mg by mouth 2 (two) times daily.      . cephALEXin (KEFLEX) 500 MG capsule Take 1 capsule (500 mg total) by mouth 3 (three) times daily.  21 capsule  0  . DEXILANT 60 MG capsule take 1 capsule once daily  30 capsule  11  . Elastic Bandages & Supports (MEDICAL COMPRESSION STOCKINGS) MISC 1 Device by Does not apply route daily. Thigh high compression stocking for superficial thrombophlebitis 451.9 left leg  1 each  0  . nabumetone (RELAFEN) 750 MG tablet Take 1 tablet (750 mg total) by mouth 2 (two) times daily as needed for pain.  30 tablet  0  . VIAGRA 100 MG tablet TAKE 1/2 TABLET BY MOUTH AS NEEDED  3 tablet  6   No current facility-administered medications for this visit.    Discontinued Meds:   There are no discontinued medications.  Patient Active Problem List   Diagnosis Date Noted  . Superficial thrombosis of lower extremity 07/01/2013  . Hematochezia 11/17/2012  . Mixed hyperlipidemia 02/15/2012  . Hypertension 01/18/2012  . Insomnia 01/18/2012  . Family history of colonic polyps 11/15/2011  . Palpitations  10/04/2011  . TOBACCO ABUSE 05/18/2010  . GERD 05/18/2010  . CONSTIPATION 05/18/2010  . 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 DYSPHAGIA UNSPECIFIED 05/18/2010  . ABDOMINAL PAIN, RIGHT LOWER QUADRANT 05/18/2010    LABS    Component Value Date/Time   NA 142 02/04/2012 0710   NA 140 09/14/2008   K 5.1 02/04/2012 0710   K 4.8 09/14/2008   CL 106 02/04/2012 0710   CL 105 09/14/2008   CO2 25 02/04/2012 0710   CO2 22 09/14/2008   GLUCOSE 88 02/04/2012 0710   GLUCOSE 83 09/14/2008   BUN 14 02/04/2012 0710   BUN 11 09/14/2008   CREATININE 1.00 02/04/2012 0710   CREATININE 0.9 09/14/2008   CALCIUM 9.7 02/04/2012 0710   CALCIUM 9.7 09/14/2008   CMP     Component Value Date/Time   NA 142 02/04/2012 0710   K 5.1 02/04/2012 0710   CL 106 02/04/2012 0710   CO2 25 02/04/2012 0710   GLUCOSE 88 02/04/2012 0710   BUN 14 02/04/2012 0710   CREATININE 1.00 02/04/2012 0710   CREATININE 0.9 09/14/2008   CALCIUM 9.7 02/04/2012 0710   PROT 6.8 02/04/2012 0710   ALBUMIN 4.3 02/04/2012 0710   AST 26 02/04/2012 0710   ALT 35 02/04/2012 0710  ALKPHOS 52 02/04/2012 0710   BILITOT 0.6 02/04/2012 0710       Component Value Date/Time   WBC 6.9 09/14/2008   HGB 15.6 09/14/2008   HCT 47.4 09/14/2008   MCV 102 09/14/2008    Lipid Panel     Component Value Date/Time   CHOL 228* 02/04/2012 0710   TRIG 261* 02/04/2012 0710   HDL 47 02/04/2012 0710   CHOLHDL 4.9 02/04/2012 0710   VLDL 52* 02/04/2012 0710   LDLCALC 129* 02/04/2012 0710    ABG No results found for this basename: phart, pco2, pco2art, po2, po2art, hco3, tco2, acidbasedef, o2sat     Lab Results  Component Value Date   TSH 2.3 09/14/2008   BNP (last 3 results) No results found for this basename: PROBNP,  in the last 8760 hours Cardiac Panel (last 3 results) No results found for this basename: CKTOTAL, CKMB, TROPONINI, RELINDX,  in the last 72 hours  Iron/TIBC/Ferritin No results found for this basename: iron, tibc, ferritin     EKG Orders placed  during the hospital encounter of 07/02/13  . ED EKG  . ED EKG  . EKG 12-LEAD  . EKG 12-LEAD     Prior Assessment and Plan Problem List as of 07/14/2013     Cardiovascular and Mediastinum   Hypertension   Last Assessment & Plan   02/15/2012 Office Visit Written 02/15/2012  3:49 PM by Jodelle Gross, NP     He tells me that the BP was elevated on last visit because he was not taking the acebutolol. He did not tolerate benicar/hctz causing him to pass out after coughing. I see that his BP is well controlled without its use today. Will not continue this medication.    Superficial thrombosis of lower extremity   Last Assessment & Plan   07/01/2013 Office Visit Written 07/01/2013  5:01 PM by Campbell Riches, NP      . cephALEXin (KEFLEX) 500 MG capsule    Sig: Take 1 capsule (500 mg total) by mouth 3 (three) times daily.    Dispense:  21 capsule    Refill:  0    Order Specific Question:  Supervising Provider    Answer:  Merlyn Albert [[id number]]  . nabumetone (RELAFEN) 750 MG tablet    Sig: Take 1 tablet (750 mg total) by mouth 2 (two) times daily as needed for pain.    Dispense:  30 tablet    Refill:  0    Order Specific Question:  Supervising Provider    Answer:  Merlyn Albert [[id number]]   Repeat venous ultrasound left calf in one week. Patient to call back sooner if symptoms worsen.         Digestive   GERD   Last Assessment & Plan   11/17/2012 Office Visit Written 11/19/2012  4:09 PM by Joselyn Arrow, NP     Refractory GERD despite daily Dexilant 60mg .  Last EGD showed severe reflux esophagitis.  EGD for further evaluation to look for complicated GERD, gastritis/esophagitis, PUD or malignancy.  I have discussed risks & benefits which include, but are not limited to, bleeding, infection, perforation & drug reaction.  The patient agrees with this plan & written consent will be obtained.    Phenergan 25mg  IV 30 min prior to procedures to augment sedation given hx etoh use  daily Continue dexilant 60mg  daily now & this may need to be changed after EGD    CONSTIPATION   DYSPHAGIA UNSPECIFIED  Genitourinary   ERECTILE DYSFUNCTION, ORGANIC     Other   Palpitations   Last Assessment & Plan   02/15/2012 Office Visit Written 02/15/2012  3:48 PM by Jodelle Gross, NP     He admits to not taking acebutolol regularly, but when his palpitations come back he will begin taking it again. He has not stopped caffeine or tobacco. He does not seem to want to take responsibility for the longterm consequences of medical noncompliance and lifestyle risks despite my counseling.     TOBACCO ABUSE   Last Assessment & Plan   02/15/2012 Office Visit Written 02/15/2012  3:52 PM by Jodelle Gross, NP     He has no plans to stop smoking.    ABDOMINAL PAIN, RIGHT LOWER QUADRANT   Family history of colonic polyps   Last Assessment & Plan   11/15/2011 Office Visit Edited 11/15/2011  2:12 PM by Tiffany Kocher, PA     Recommend colonoscopy given family history of colon polyps in 2 sisters in their mid 42s. Patient is aware of recommendation, he will call as when he is ready to schedule procedure.      Insomnia   Last Assessment & Plan   01/18/2012 Office Visit Written 01/18/2012  4:36 PM by Jodelle Gross, NP     He works alternating day and night shifts and often has trouble sleeping during the day. I have given him Ambien 5 mg to take prn. He is advised to get black out curtains to assist with daytime light coming in during sleep. He verbalizes understanding.     Mixed hyperlipidemia   Last Assessment & Plan   02/15/2012 Office Visit Written 02/15/2012  3:52 PM by Jodelle Gross, NP     I reviewed his lipid study with him. He states he does not want to take the avorvastatin.  I showed him a copy of the results with TC  228;TG 261 and LDL 129.  He is reluctant to take any medications or to change eating and alcohol habits.  I have reinforced the need to continue medication. The long  term risks are reinforced to him.    Hematochezia   Last Assessment & Plan   11/17/2012 Office Visit Written 11/19/2012  4:11 PM by Joselyn Arrow, NP     Colonoscopy w/ Dr Jena Gauss to determine source of dark red hematochezia.  Differentials include ischemic colitis, NSAID-induced enteropathy, colorectal carcinoma, polyp, or diverticular bleeding.  Less likely small bowel source.  I have discussed risks & benefits which include, but are not limited to, bleeding, infection, perforation & drug reaction.  The patient agrees with this plan & written consent will be obtained.     1-800-QUIT-NOW for help quitting smoking It would be best to avoid alcohol intake now due to your stomach discomfort Advised to avoid NSAIDS         Imaging: US Venous Img Lower Unilateral Left  07/01/2013   *RADIOLOGY REPORT*  Left lower extremity venous duplex ultrasound  History:  Left calf region pain and redness  Technique:  Real-time and Doppler interrogation of the left lower extremity venous system was performed.  Findings:  In the left lower extremity deep system, flow is spontaneous and phasic in all segments.  There is normal compression and augmentation in the venous structures of the deep system of the left lower extremity. Venous Doppler is normal throughout the deep venous structures of left lower extremity. There is no thrombus in the deep  venous structures of the left lower extremity.  There is no deep venous incompetence on the left.  The saphenofemoral junction region appears normal on the left. Visualized portions of the greater saphenous vein appear normal without thrombus.  There are several dilated varices in the calf region on the left which contain acute thrombus.  These are all superficial thrombi.  Conclusion:  Superficial acute venous thrombi are present in multiple dilated varices in the left calf region.  No deep venous thrombosis is seen on this study.   Original Report Authenticated By: Bretta Bang, M.D.

## 2013-07-14 NOTE — Patient Instructions (Addendum)
Your physician recommends that you schedule a follow-up appointment in: 6 MONTHS You will receive a reminder letter in the mail in about 4 months reminding you to call and schedule your appointment. If you don't receive this letter, please contact our office.   Your physician has requested that you have a lower or upper extremity venous duplex. This test is an ultrasound of the veins in the legs or arms. It looks at venous blood flow that carries blood from the heart to the legs or arms. Allow one hour for a Lower Venous exam. Allow thirty minutes for an Upper Venous exam. There are no restrictions or special instructions.

## 2013-07-15 ENCOUNTER — Ambulatory Visit (HOSPITAL_COMMUNITY)
Admission: RE | Admit: 2013-07-15 | Discharge: 2013-07-15 | Disposition: A | Discharge status: Home / Self Care | Payer: No Typology Code available for payment source | Source: Ambulatory Visit | Attending: Nurse Practitioner | Admitting: Nurse Practitioner

## 2013-07-15 DIAGNOSIS — I83893 Varicose veins of bilateral lower extremities with other complications: Secondary | ICD-10-CM | POA: Insufficient documentation

## 2013-07-15 DIAGNOSIS — I8000 Phlebitis and thrombophlebitis of superficial vessels of unspecified lower extremity: Secondary | ICD-10-CM | POA: Insufficient documentation

## 2013-07-16 ENCOUNTER — Telehealth: Payer: Self-pay | Admitting: Family Medicine

## 2013-07-16 ENCOUNTER — Other Ambulatory Visit: Payer: Self-pay | Admitting: Nurse Practitioner

## 2013-07-16 DIAGNOSIS — I83893 Varicose veins of bilateral lower extremities with other complications: Secondary | ICD-10-CM

## 2013-07-16 NOTE — Progress Notes (Signed)
Notified patient that no change from previous study. No evidence of DVT. Strongly recommend that he consider seeing a vein specialist in [facility] for severe varicose veins. Patient states that he is going to do some research vein specialists and give [place] a call back.

## 2013-07-16 NOTE — Telephone Encounter (Signed)
Pt has decided to see Dr. Hart Rochester @ [facility] in [place], wants Dr. Hart Rochester specifically.  This referral was recommended on his leg ultrasound results recently.  If "OK" please initiate in the system so that I may proceed.

## 2013-07-17 ENCOUNTER — Other Ambulatory Visit: Payer: Self-pay

## 2013-07-17 DIAGNOSIS — I83893 Varicose veins of bilateral lower extremities with other complications: Secondary | ICD-10-CM

## 2013-08-03 ENCOUNTER — Encounter: Payer: No Typology Code available for payment source | Admitting: Vascular Surgery

## 2013-09-08 ENCOUNTER — Encounter: Payer: No Typology Code available for payment source | Admitting: Vascular Surgery

## 2013-09-18 ENCOUNTER — Encounter (HOSPITAL_COMMUNITY): Payer: Self-pay | Admitting: Emergency Medicine

## 2013-09-18 ENCOUNTER — Emergency Department (INDEPENDENT_AMBULATORY_CARE_PROVIDER_SITE_OTHER)
Admission: EM | Admit: 2013-09-18 | Discharge: 2013-09-18 | Disposition: A | Discharge status: Home / Self Care | Payer: No Typology Code available for payment source | Source: Home / Self Care | Attending: Family Medicine | Admitting: Family Medicine

## 2013-09-18 DIAGNOSIS — R002 Palpitations: Secondary | ICD-10-CM

## 2013-09-18 LAB — POCT I-STAT, CHEM 8
BUN: 12 mg/dL (ref 6–23)
Calcium, Ion: 1.17 mmol/L (ref 1.12–1.23)
Chloride: 107 mEq/L (ref 96–112)
Creatinine, Ser: 1 mg/dL (ref 0.50–1.35)
Glucose, Bld: 98 mg/dL (ref 70–99)
HCT: 54 % — ABNORMAL HIGH (ref 39.0–52.0)
Hemoglobin: 18.4 g/dL — ABNORMAL HIGH (ref 13.0–17.0)
Potassium: 4.4 mEq/L (ref 3.5–5.1)

## 2013-09-18 NOTE — ED Notes (Signed)
Assessed at triage

## 2013-09-18 NOTE — ED Notes (Signed)
Patient was assessed at registration

## 2013-09-18 NOTE — ED Provider Notes (Signed)
Bradley Miles is a 47 y.o. male who presents to Urgent Care today for high blood pressure jitteriness and weakness. The symptoms started today and yesterday. He notes palpitations and a feeling of weakness. This worsened at work and pressure was assessed and found to be in the 160s/100s range. He notes that he has a history of palpitations and takes acebutolol. He notes that he has missed a few doses recently. He denies any chest pain shortness of breath or syncope. He feels well otherwise.   Past Medical History  Diagnosis Date  . Hypertension   . GERD (gastroesophageal reflux disease)   . Anxiety   . Palpitations   . Shortness of breath    History  Substance Use Topics  . Smoking status: Current Every Day Smoker -- 1.50 packs/day for 30 years    Types: Cigarettes  . Smokeless tobacco: Not on file  . Alcohol Use: Yes     Comment: consumes beer or liquor three to four times weekly. Up to six pack or three shots each occurence or more x 20+ yrs   ROS as above Medications reviewed. No current facility-administered medications for this encounter.   Current Outpatient Prescriptions  Medication Sig Dispense Refill  . acebutolol (SECTRAL) 400 MG capsule Take 400 mg by mouth 2 (two) times daily.      Marland Kitchen DEXILANT 60 MG capsule take 1 capsule once daily  30 capsule  11  . VIAGRA 100 MG tablet TAKE 1/2 TABLET BY MOUTH AS NEEDED  3 tablet  6    Exam:  BP 121/77  Pulse 87  Temp(Src) 98 F (36.7 C) (Oral)  Resp 22  SpO2 97% Filed Vitals:   09/18/13 0948 09/18/13 0950 09/18/13 0952 09/18/13 1032  BP: 149/91 134/98 122/77 121/77  Pulse: 79 85 88 87  Temp:    98 F (36.7 C)  TempSrc:    Oral  Resp:    22  SpO2:    97%   Gen: Well NAD HEENT: EOMI,  MMM Lungs: CTABL Nl WOB Heart: RRR no MRG Abd: NABS, NT, ND Exts: Non edematous BL  LE, warm and well perfused.   Twelve-lead EKG shows normal sinus rhythm at 78 beats per minute. No significant abnormalities.   Results for orders  placed during the hospital encounter of 09/18/13 (from the past 24 hour(s))  POCT I-STAT, CHEM 8     Status: Abnormal   Collection Time    09/18/13  9:51 AM      Result Value Range   Sodium 140  135 - 145 mEq/L   Potassium 4.4  3.5 - 5.1 mEq/L   Chloride 107  96 - 112 mEq/L   BUN 12  6 - 23 mg/dL   Creatinine, Ser 1.61  0.50 - 1.35 mg/dL   Glucose, Bld 98  70 - 99 mg/dL   Calcium, Ion 0.96  0.45 - 1.23 mmol/L   TCO2 24  0 - 100 mmol/L   Hemoglobin 18.4 (*) 13.0 - 17.0 g/dL   HCT 40.9 (*) 81.1 - 91.4 %   No results found.  Assessment and Plan: 47 y.o. male with jitteriness and dizziness. Possibly related to mild orthostasis. This is likely medication effect as patient recently restarted his beta blocker. I advised him to continue his beta blocker and to drink plenty of water and followup with his primary care provider. Discussed warning signs or symptoms. Please see discharge instructions. Patient expresses understanding.      Rodolph Bong,  MD 09/18/13 1046

## 2013-09-18 NOTE — ED Notes (Signed)
Patient reports feeling weak, jittery, dizzy, dry mouth and yest very diaphoretic this am which sparked taking blood pressure at work with a wrist automatic bp cuff.  Reports systolic bp from 161 to 178.  Denies pain in chest.  Reported headache.  Patient out of bp med for 2 days, did take evening dose last night and this morning took am dose

## 2013-10-19 ENCOUNTER — Telehealth: Payer: Self-pay | Admitting: Adult Health

## 2013-10-19 MED ORDER — ACEBUTOLOL HCL 400 MG PO CAPS: 400.0000 mg | ORAL_CAPSULE | Freq: Two times a day (BID) | ORAL | Status: DC

## 2013-10-19 NOTE — Telephone Encounter (Signed)
Received fax refill request  Rx # [id number] Medication:  Acebutolol 400mg  capsule Qty 180 Sig:  Take one capsule by mouth twice a day Physician:  Lyman Bishop

## 2013-10-19 NOTE — Telephone Encounter (Signed)
Medication sent via escribe.  

## 2013-12-21 ENCOUNTER — Other Ambulatory Visit: Payer: Self-pay | Admitting: Gastroenterology

## 2013-12-29 ENCOUNTER — Telehealth: Payer: Self-pay | Admitting: Internal Medicine

## 2013-12-29 MED ORDER — DEXLANSOPRAZOLE 60 MG PO CPDR: DELAYED_RELEASE_CAPSULE | ORAL | Status: DC

## 2013-12-29 NOTE — Telephone Encounter (Signed)
Routing to refill box  

## 2013-12-29 NOTE — Addendum Note (Signed)
Addended by: Orvil Feil on: 12/29/2013 02:52 PM   Modules accepted: Orders

## 2013-12-29 NOTE — Telephone Encounter (Signed)
Completed.

## 2013-12-29 NOTE — Telephone Encounter (Signed)
Patient stopped by front office requesting a refill on his dexilant. He uses [facility] in [place].

## 2014-01-08 ENCOUNTER — Telehealth: Payer: Self-pay | Admitting: *Deleted

## 2014-01-08 NOTE — Telephone Encounter (Signed)
Pt states he has been having 179/108 at 6 am. He has made a appt for next Tuesday with kathryn. He wants to know if he can double up on medications. Feeling gittery weak, normal Bp 145/80  Sent phone call to [facility]

## 2014-01-08 NOTE — Telephone Encounter (Signed)
Spoke to pt made him aware to go to ER and also see PCP

## 2014-01-08 NOTE — Telephone Encounter (Signed)
Based on information not able to properly assess what may be going on. If he is feeling persistently ill he will need to go to the ER to be evaluated.   Carlyle Dolly MD

## 2014-01-08 NOTE — Telephone Encounter (Signed)
Pt woke up in the middle of the night not feeling good. Pt had to leave work this morning due to feeling jittery and weak. No complaints of CP, SOB, or swelling. BP this morning was 179/108, normal BP usually 145/80. Former pt of Lattie Haw, last seen by him was 2013. Has seen Wanda Plump, NP since then. Told pt if symptoms get worse to go to the ER.  Please advise.

## 2014-01-09 ENCOUNTER — Emergency Department (HOSPITAL_COMMUNITY)
Admission: EM | Admit: 2014-01-09 | Discharge: 2014-01-09 | Disposition: A | Discharge status: Home / Self Care | Payer: BC Managed Care – PPO | Source: Home / Self Care | Attending: Family Medicine | Admitting: Family Medicine

## 2014-01-09 ENCOUNTER — Encounter (HOSPITAL_COMMUNITY): Payer: Self-pay | Admitting: Emergency Medicine

## 2014-01-09 DIAGNOSIS — I10 Essential (primary) hypertension: Secondary | ICD-10-CM

## 2014-01-09 MED ORDER — HYDROCHLOROTHIAZIDE 25 MG PO TABS: 25.0000 mg | ORAL_TABLET | Freq: Every day | ORAL | Status: DC

## 2014-01-09 NOTE — Discharge Instructions (Signed)
Thank you for coming in today. Take HCTZ once daily.  Follow up with your cardiologist on Tuesday.  Call or go to the emergency room if you get worse, have trouble breathing, have chest pains, or palpitations.   Arterial Hypertension Arterial hypertension (high blood pressure) is a condition of elevated pressure in your blood vessels. Hypertension over a long period of time is a risk factor for strokes, heart attacks, and heart failure. It is also the leading cause of kidney (renal) failure.  CAUSES   In Adults -- Over 90% of all hypertension has no known cause. This is called essential or primary hypertension. In the other 10% of people with hypertension, the increase in blood pressure is caused by another disorder. This is called secondary hypertension. Important causes of secondary hypertension are:  Heavy alcohol use.  Obstructive sleep apnea.  Hyperaldosterosim (Conn's syndrome).  Steroid use.  Chronic kidney failure.  Hyperparathyroidism.  Medications.  Renal artery stenosis.  Pheochromocytoma.  Cushing's disease.  Coarctation of the aorta.  Scleroderma renal crisis.  Licorice (in excessive amounts).  Drugs (cocaine, methamphetamine). Your caregiver can explain any items above that apply to you.  In Children -- Secondary hypertension is more common and should always be considered.  Pregnancy -- Few women of childbearing age have high blood pressure. However, up to 10% of them develop hypertension of pregnancy. Generally, this will not harm the woman. It may be a sign of 3 complications of pregnancy: preeclampsia, HELLP syndrome, and eclampsia. Follow up and control with medication is necessary. SYMPTOMS   This condition normally does not produce any noticeable symptoms. It is usually found during a routine exam.  Malignant hypertension is a late problem of high blood pressure. It may have the following symptoms:  Headaches.  Blurred vision.  End-organ damage  (this means your kidneys, heart, lungs, and other organs are being damaged).  Stressful situations can increase the blood pressure. If a person with normal blood pressure has their blood pressure go up while being seen by their caregiver, this is often termed "white coat hypertension." Its importance is not known. It may be related with eventually developing hypertension or complications of hypertension.  Hypertension is often confused with mental tension, stress, and anxiety. DIAGNOSIS  The diagnosis is made by 3 separate blood pressure measurements. They are taken at least 1 week apart from each other. If there is organ damage from hypertension, the diagnosis may be made without repeat measurements. Hypertension is usually identified by having blood pressure readings:  Above 140/90 mmHg measured in both arms, at 3 separate times, over a couple weeks.  Over 130/80 mmHg should be considered a risk factor and may require treatment in patients with diabetes. Blood pressure readings over 120/80 mmHg are called "pre-hypertension" even in non-diabetic patients. To get a true blood pressure measurement, use the following guidelines. Be aware of the factors that can alter blood pressure readings.  Take measurements at least 1 hour after caffeine.  Take measurements 30 minutes after smoking and without any stress. This is another reason to quit smoking  it raises your blood pressure.  Use a proper cuff size. Ask your caregiver if you are not sure about your cuff size.  Most home blood pressure cuffs are automatic. They will measure systolic and diastolic pressures. The systolic pressure is the pressure reading at the start of sounds. Diastolic pressure is the pressure at which the sounds disappear. If you are elderly, measure pressures in multiple postures. Try sitting, lying  or standing.  Sit at rest for a minimum of 5 minutes before taking measurements.  You should not be on any medications like  decongestants. These are found in many cold medications.  Record your blood pressure readings and review them with your caregiver. If you have hypertension:  Your caregiver may do tests to be sure you do not have secondary hypertension (see "causes" above).  Your caregiver may also look for signs of metabolic syndrome. This is also called Syndrome X or Insulin Resistance Syndrome. You may have this syndrome if you have type 2 diabetes, abdominal obesity, and abnormal blood lipids in addition to hypertension.  Your caregiver will take your medical and family history and perform a physical exam.  Diagnostic tests may include blood tests (for glucose, cholesterol, potassium, and kidney function), a urinalysis, or an EKG. Other tests may also be necessary depending on your condition. PREVENTION  There are important lifestyle issues that you can adopt to reduce your chance of developing hypertension:  Maintain a normal weight.  Limit the amount of salt (sodium) in your diet.  Exercise often.  Limit alcohol intake.  Get enough potassium in your diet. Discuss specific advice with your caregiver.  Follow a DASH diet (dietary approaches to stop hypertension). This diet is rich in fruits, vegetables, and low-fat dairy products, and avoids certain fats. PROGNOSIS  Essential hypertension cannot be cured. Lifestyle changes and medical treatment can lower blood pressure and reduce complications. The prognosis of secondary hypertension depends on the underlying cause. Many people whose hypertension is controlled with medicine or lifestyle changes can live a normal, healthy life.  RISKS AND COMPLICATIONS  While high blood pressure alone is not an illness, it often requires treatment due to its short- and long-term effects on many organs. Hypertension increases your risk for:  CVAs or strokes (cerebrovascular accident).  Heart failure due to chronically high blood pressure (hypertensive  cardiomyopathy).  Heart attack (myocardial infarction).  Damage to the retina (hypertensive retinopathy).  Kidney failure (hypertensive nephropathy). Your caregiver can explain list items above that apply to you. Treatment of hypertension can significantly reduce the risk of complications. TREATMENT   For overweight patients, weight loss and regular exercise are recommended. Physical fitness lowers blood pressure.  Mild hypertension is usually treated with diet and exercise. A diet rich in fruits and vegetables, fat-free dairy products, and foods low in fat and salt (sodium) can help lower blood pressure. Decreasing salt intake decreases blood pressure in a 1/3 of people.  Stop smoking if you are a smoker. The steps above are highly effective in reducing blood pressure. While these actions are easy to suggest, they are difficult to achieve. Most patients with moderate or severe hypertension end up requiring medications to bring their blood pressure down to a normal level. There are several classes of medications for treatment. Blood pressure pills (antihypertensives) will lower blood pressure by their different actions. Lowering the blood pressure by 10 mmHg may decrease the risk of complications by as much as 25%. The goal of treatment is effective blood pressure control. This will reduce your risk for complications. Your caregiver will help you determine the best treatment for you according to your lifestyle. What is excellent treatment for one person, may not be for you. HOME CARE INSTRUCTIONS   Do not smoke.  Follow the lifestyle changes outlined in the "Prevention" section.  If you are on medications, follow the directions carefully. Blood pressure medications must be taken as prescribed. Skipping doses reduces their  benefit. It also puts you at risk for problems.  Follow up with your caregiver, as directed.  If you are asked to monitor your blood pressure at home, follow the  guidelines in the "Diagnosis" section above. SEEK MEDICAL CARE IF:   You think you are having medication side effects.  You have recurrent headaches or lightheadedness.  You have swelling in your ankles.  You have trouble with your vision. SEEK IMMEDIATE MEDICAL CARE IF:   You have sudden onset of chest pain or pressure, difficulty breathing, or other symptoms of a heart attack.  You have a severe headache.  You have symptoms of a stroke (such as sudden weakness, difficulty speaking, difficulty walking). MAKE SURE YOU:   Understand these instructions.  Will watch your condition.  Will get help right away if you are not doing well or get worse. Document Released: 11/26/2005 Document Revised: 02/18/2012 Document Reviewed: 06/26/2007 [facility] Patient Information 2014 [place].

## 2014-01-09 NOTE — ED Provider Notes (Signed)
Bradley Miles is a 48 y.o. male who presents to Urgent Care today for hypertension. Patient has noted that his blood pressure tends to be elevated recently. He is currently taking acebutolol for palpitations. He notes occasional headache and fatigue. He denies any significant central or exertional chest pain. He does note occasional palpitations most recently occurring several days ago. He no longer is symptomatic. He denies any weakness or numbness dizziness or loss of function.  He has an appointment with his cardiologist in 3 days.    Past Medical History  Diagnosis Date  . Hypertension   . GERD (gastroesophageal reflux disease)   . Anxiety   . Palpitations   . Shortness of breath    History  Substance Use Topics  . Smoking status: Current Every Day Smoker -- 1.50 packs/day for 30 years    Types: Cigarettes  . Smokeless tobacco: Not on file  . Alcohol Use: Yes     Comment: consumes beer or liquor three to four times weekly. Up to six pack or three shots each occurence or more x 20+ yrs   ROS as above Medications: No current facility-administered medications for this encounter.   Current Outpatient Prescriptions  Medication Sig Dispense Refill  . acebutolol (SECTRAL) 400 MG capsule Take 1 capsule (400 mg total) by mouth 2 (two) times daily.  60 capsule  6  . dexlansoprazole (DEXILANT) 60 MG capsule take 1 capsule by mouth once daily  30 capsule  11  . hydrochlorothiazide (HYDRODIURIL) 25 MG tablet Take 1 tablet (25 mg total) by mouth daily.  30 tablet  1  . VIAGRA 100 MG tablet TAKE 1/2 TABLET BY MOUTH AS NEEDED  3 tablet  6    Exam:  BP 159/98  Pulse 73  Temp(Src) 98.2 F (36.8 C) (Oral)  Resp 18  SpO2 98% Gen: Well NAD HEENT: EOMI,  MMM Lungs: Normal work of breathing. CTABL Heart: RRR no MRG Abd: NABS, Soft. NT, ND Exts: Brisk capillary refill, warm and well perfused.    Assessment and Plan: 48 y.o. male with hypertension.  Potassium was 4.4 and creatinine was 1  and October 2014. Plan to start hydrochlorothiazide.  Followup with cardiology.    Discussed warning signs or symptoms. Please see discharge instructions. Patient expresses understanding.    Gregor Hams, MD 01/09/14 [phone]

## 2014-01-09 NOTE — ED Notes (Signed)
Pt concerned about HBP Sxs include HA, fatigue, diaphoresis, occasional CP Denies: SOB, nauseas BP today is 159/98 He is alert w/no signs of acute distress.

## 2014-01-12 ENCOUNTER — Ambulatory Visit (INDEPENDENT_AMBULATORY_CARE_PROVIDER_SITE_OTHER): Payer: BC Managed Care – PPO | Admitting: Adult Health

## 2014-01-12 ENCOUNTER — Encounter: Payer: Self-pay | Admitting: Adult Health

## 2014-01-12 VITALS — BP 126/78 | HR 92 | Ht 73.0 in | Wt 254.0 lb

## 2014-01-12 DIAGNOSIS — R42 Dizziness and giddiness: Secondary | ICD-10-CM

## 2014-01-12 DIAGNOSIS — R519 Headache, unspecified: Secondary | ICD-10-CM | POA: Insufficient documentation

## 2014-01-12 DIAGNOSIS — Z8489 Family history of other specified conditions: Secondary | ICD-10-CM

## 2014-01-12 DIAGNOSIS — R51 Headache: Secondary | ICD-10-CM

## 2014-01-12 NOTE — Assessment & Plan Note (Signed)
He is advised to decrease and eventually stop smoking. He is up to 1 1/2 - 2 ppd. Anxiety is also playing into this. He has Rx from Dr. Wolfgang Phoenix for [place]. He is asked to follow up with Dr. Wolfgang Phoenix for ongoing assessment.

## 2014-01-12 NOTE — Assessment & Plan Note (Signed)
Will have CT scan of the head completed for evaluation to given him more information concerning this.

## 2014-01-12 NOTE — Progress Notes (Signed)
    HPI: Bradley Miles is a 48 year old patient will be establishing with Dr. Harl Bowie we are following for ongoing assessment and management of palpitations, hypertension, with history of ongoing tobacco abuse. The patient was last seen in the office in August of 2014. He has a history of thrombophlebitis of the left leg. He was ruled out for DVT. He has a history of medical noncompliance, and was not taking medications as directed on last visit. The patient was advised to continue taking HDL all as directed for palpitations. She was also counseled on smoking cessation.     He comes today with complaints of BP being up and down with head aches and dizziness with sharp pain in the back of his head. He is also having some anxiety about the headaches as he has a family history of glioblastoma with father dying in his early 80;s.       No Known Allergies  Current Outpatient Prescriptions  Medication Sig Dispense Refill  . acebutolol (SECTRAL) 400 MG capsule Take 1 capsule (400 mg total) by mouth 2 (two) times daily.  60 capsule  6  . dexlansoprazole (DEXILANT) 60 MG capsule take 1 capsule by mouth once daily  30 capsule  11  . hydrochlorothiazide (HYDRODIURIL) 25 MG tablet Take 1 tablet (25 mg total) by mouth daily.  30 tablet  1  . VIAGRA 100 MG tablet TAKE 1/2 TABLET BY MOUTH AS NEEDED  3 tablet  6   No current facility-administered medications for this visit.    Past Medical History  Diagnosis Date  . Hypertension   . GERD (gastroesophageal reflux disease)   . Anxiety   . Palpitations   . Shortness of breath     Past Surgical History  Procedure Laterality Date  . Esophagogastroduodenoscopy  05/19/09    RMR: Geographic distal esophageal erosions consistent with severe erosive reflux esophagitis. schatzki's ring s/P dilation/small hiatal hernia otherwise normal stomach  . Cyst reomved      from spine  . Wisdom tooth extraction    . Colonoscopy with esophagogastroduodenoscopy (egd)  12/16/2012     Procedure: COLONOSCOPY WITH ESOPHAGOGASTRODUODENOSCOPY (EGD);  Surgeon: Daneil Dolin, MD;  Location: [facility];  Service: Endoscopy;  Laterality: N/A;  7:30    ROS: Review of systems complete and found to be negative unless listed above  PHYSICAL EXAM BP 126/78  Pulse 92  Ht 6\' 1"  (1.854 m)  Wt 254 lb (115.214 kg)  BMI 33.52 kg/m2 General: Well developed, well nourished, in no acute distress Head: Eyes PERRLA, No xanthomas.   Normal cephalic and atramatic  Lungs: Clear bilaterally to auscultation and percussion. Heart: HRRR S1 S2, without MRG.  Pulses are 2+ & equal.            No carotid bruit. No JVD.  No abdominal bruits. No femoral bruits. Abdomen: Bowel sounds are positive, abdomen soft and non-tender without masses or                  Hernia's noted. Msk:  Back normal, normal gait. Normal strength and tone for age. Extremities: No clubbing, cyanosis or edema.  DP +1 Neuro: Alert and oriented X 3. Psych:  Good affect, responds appropriately    ASSESSMENT AND PLAN

## 2014-01-12 NOTE — Assessment & Plan Note (Signed)
He was given HCTZ 25 mg daily for BP control by urgent care due to elevated BP over the weekend. He states it was 160/90. His home BP cuff does not correlate with our BP readings. He is given a free automatic BP cuff and recording sheet. He will take BP at the same time each day and record. He will continue HCTZ and report any muscle cramps.

## 2014-01-12 NOTE — Patient Instructions (Signed)
Your physician recommends that you schedule a follow-up appointment in: ONE MONTH WITH Dr Harl Bowie OR Leonia Reader NP  Your physician has requested that you regularly monitor and record your blood pressure readings at home. Please use the same machine at the same time of day to check your readings and record them to bring to your follow-up visit.BRING READINGS WITH YOU AT YOUR NEXT OFFICE VISIT  Non-Cardiac CT scanning, (CAT scanning), is a noninvasive, special x-ray that produces cross-sectional images of the body using x-rays and a computer. CT scans help physicians diagnose and treat medical conditions. For some CT exams, a contrast material is used to enhance visibility in the area of the body being studied. CT scans provide greater clarity and reveal more details than regular x-ray exams.  WE WILL CALL YOU WITH YOUR TEST RESULTS/INSTRUCTIONS/NEXT STEPS ONCE RECEIVED BY THE PROVIDER

## 2014-01-12 NOTE — Progress Notes (Deleted)
Name: Bradley Miles    DOB: 1966-05-21  Age: 48 y.o.  MR#: [id number]       PCP:  Rubbie Battiest, MD      Insurance: Payor: [place] / Plan: BCBS [place] PPO / Product Type: *No Product type* /   CC:    Chief Complaint  Patient presents with  . Palpitations    VS Filed Vitals:   01/12/14 1312  BP: 126/78  Pulse: 92  Height: 6\' 1"  (1.854 m)  Weight: 254 lb (115.214 kg)    Weights Current Weight  01/12/14 254 lb (115.214 kg)  07/14/13 246 lb (111.585 kg)  07/01/13 262 lb 2 oz (118.899 kg)    Blood Pressure  BP Readings from Last 3 Encounters:  01/12/14 126/78  01/09/14 159/98  09/18/13 121/77     Admit date:  (Not on file) Last encounter with RMR:  10/19/2013   Allergy Review of patient's allergies indicates no known allergies.  Current Outpatient Prescriptions  Medication Sig Dispense Refill  . acebutolol (SECTRAL) 400 MG capsule Take 1 capsule (400 mg total) by mouth 2 (two) times daily.  60 capsule  6  . dexlansoprazole (DEXILANT) 60 MG capsule take 1 capsule by mouth once daily  30 capsule  11  . hydrochlorothiazide (HYDRODIURIL) 25 MG tablet Take 1 tablet (25 mg total) by mouth daily.  30 tablet  1  . VIAGRA 100 MG tablet TAKE 1/2 TABLET BY MOUTH AS NEEDED  3 tablet  6   No current facility-administered medications for this visit.    Discontinued Meds:   There are no discontinued medications.  Patient Active Problem List   Diagnosis Date Noted  . Superficial thrombosis of lower extremity 07/01/2013  . Hematochezia 11/17/2012  . Mixed hyperlipidemia 02/15/2012  . Hypertension 01/18/2012  . Insomnia 01/18/2012  . Family history of colonic polyps 11/15/2011  . Palpitations 10/04/2011  . TOBACCO ABUSE 05/18/2010  . GERD 05/18/2010  . CONSTIPATION 05/18/2010  . 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 DYSPHAGIA UNSPECIFIED 05/18/2010  . ABDOMINAL PAIN, RIGHT LOWER QUADRANT 05/18/2010    LABS    Component Value Date/Time   NA 140 09/18/2013  0951   NA 142 02/04/2012 0710   NA 140 09/14/2008   K 4.4 09/18/2013 0951   K 5.1 02/04/2012 0710   K 4.8 09/14/2008   CL 107 09/18/2013 0951   CL 106 02/04/2012 0710   CL 105 09/14/2008   CO2 25 02/04/2012 0710   CO2 22 09/14/2008   GLUCOSE 98 09/18/2013 0951   GLUCOSE 88 02/04/2012 0710   GLUCOSE 83 09/14/2008   BUN 12 09/18/2013 0951   BUN 14 02/04/2012 0710   BUN 11 09/14/2008   CREATININE 1.00 09/18/2013 0951   CREATININE 1.00 02/04/2012 0710   CREATININE 0.9 09/14/2008   CALCIUM 9.7 02/04/2012 0710   CALCIUM 9.7 09/14/2008   CMP     Component Value Date/Time   NA 140 09/18/2013 0951   K 4.4 09/18/2013 0951   CL 107 09/18/2013 0951   CO2 25 02/04/2012 0710   GLUCOSE 98 09/18/2013 0951   BUN 12 09/18/2013 0951   CREATININE 1.00 09/18/2013 0951   CREATININE 1.00 02/04/2012 0710   CALCIUM 9.7 02/04/2012 0710   PROT 6.8 02/04/2012 0710   ALBUMIN 4.3 02/04/2012 0710   AST 26 02/04/2012 0710   ALT 35 02/04/2012 0710   ALKPHOS 52 02/04/2012 0710   BILITOT 0.6 02/04/2012 0710       Component  Value Date/Time   WBC 6.9 09/14/2008   HGB 18.4* 09/18/2013 0951   HGB 15.6 09/14/2008   HCT 54.0* 09/18/2013 0951   HCT 47.4 09/14/2008   MCV 102 09/14/2008    Lipid Panel     Component Value Date/Time   CHOL 228* 02/04/2012 0710   TRIG 261* 02/04/2012 0710   HDL 47 02/04/2012 0710   CHOLHDL 4.9 02/04/2012 0710   VLDL 52* 02/04/2012 0710   LDLCALC 129* 02/04/2012 0710    ABG    Component Value Date/Time   TCO2 24 09/18/2013 0951     Lab Results  Component Value Date   TSH 2.3 09/14/2008   BNP (last 3 results) No results found for this basename: PROBNP,  in the last 8760 hours Cardiac Panel (last 3 results) No results found for this basename: CKTOTAL, CKMB, TROPONINI, RELINDX,  in the last 72 hours  Iron/TIBC/Ferritin No results found for this basename: iron, tibc, ferritin     EKG Orders placed during the hospital encounter of 09/18/13  . ED EKG  . ED EKG  . EKG 12-LEAD  . EKG  12-LEAD     Prior Assessment and Plan Problem List as of 01/12/2014     Cardiovascular and Mediastinum   Hypertension   Last Assessment & Plan   02/15/2012 Office Visit Written 02/15/2012  3:49 PM by Lendon Colonel, NP     He tells me that the BP was elevated on last visit because he was not taking the acebutolol. He did not tolerate benicar/hctz causing him to pass out after coughing. I see that his BP is well controlled without its use today. Will not continue this medication.    Superficial thrombosis of lower extremity   Last Assessment & Plan   07/14/2013 Office Visit Written 07/14/2013  4:46 PM by Lendon Colonel, NP     He is feeling some better in the left lower extremity but continues soreness in the upper left thigh. He is to be placed on ECASA 81 mg daily. He is to wear compression hose as directed. I will repeat venous  doppler U/S of left leg to ascertain for continued thrombus.      Digestive   GERD   Last Assessment & Plan   11/17/2012 Office Visit Written 11/19/2012  4:09 PM by Andria Meuse, NP     Refractory GERD despite daily Dexilant 60mg .  Last EGD showed severe reflux esophagitis.  EGD for further evaluation to look for complicated GERD, gastritis/esophagitis, PUD or malignancy.  I have discussed risks & benefits which include, but are not limited to, bleeding, infection, perforation & drug reaction.  The patient agrees with this plan & written consent will be obtained.    Phenergan 25mg  IV 30 min prior to procedures to augment sedation given hx etoh use daily Continue dexilant 60mg  daily now & this may need to be changed after EGD    CONSTIPATION   DYSPHAGIA UNSPECIFIED     Genitourinary   ERECTILE DYSFUNCTION, ORGANIC     Other   Palpitations   Last Assessment & Plan   07/14/2013 Office Visit Written 07/14/2013  4:48 PM by Lendon Colonel, NP     He continues to have medical noncompliance with use of acebutelol. I have asked him to take the medication as  directed so that he will not have recurrence of palpitations. He states that when he forgets to take the medication, he notices that his palpitations get worse. I have  provided a Rx renewal for BB.    TOBACCO ABUSE   Last Assessment & Plan   07/14/2013 Office Visit Written 07/14/2013  4:46 PM by Lendon Colonel, NP     He is counseled on smoking cessation. He verbalizes understanding.    ABDOMINAL PAIN, RIGHT LOWER QUADRANT   Family history of colonic polyps   Last Assessment & Plan   11/15/2011 Office Visit Edited 11/15/2011  2:12 PM by Mahala Menghini, PA     Recommend colonoscopy given family history of colon polyps in 2 sisters in their mid 42s. Patient is aware of recommendation, he will call as when he is ready to schedule procedure.      Insomnia   Last Assessment & Plan   01/18/2012 Office Visit Written 01/18/2012  4:36 PM by Lendon Colonel, NP     He works alternating day and night shifts and often has trouble sleeping during the day. I have given him Ambien 5 mg to take prn. He is advised to get black out curtains to assist with daytime light coming in during sleep. He verbalizes understanding.     Mixed hyperlipidemia   Last Assessment & Plan   02/15/2012 Office Visit Written 02/15/2012  3:52 PM by Lendon Colonel, NP     I reviewed his lipid study with him. He states he does not want to take the avorvastatin.  I showed him a copy of the results with TC  228;TG 261 and LDL 129.  He is reluctant to take any medications or to change eating and alcohol habits.  I have reinforced the need to continue medication. The long term risks are reinforced to him.    Hematochezia   Last Assessment & Plan   11/17/2012 Office Visit Written 11/19/2012  4:11 PM by Andria Meuse, NP     Colonoscopy w/ Dr Gala Romney to determine source of dark red hematochezia.  Differentials include ischemic colitis, NSAID-induced enteropathy, colorectal carcinoma, polyp, or diverticular bleeding.  Less likely small bowel  source.  I have discussed risks & benefits which include, but are not limited to, bleeding, infection, perforation & drug reaction.  The patient agrees with this plan & written consent will be obtained.     1-800-QUIT-NOW for help quitting smoking It would be best to avoid alcohol intake now due to your stomach discomfort Advised to avoid NSAIDS         Imaging: No results found.

## 2014-01-15 ENCOUNTER — Ambulatory Visit (HOSPITAL_COMMUNITY)
Admission: RE | Admit: 2014-01-15 | Discharge: 2014-01-15 | Disposition: A | Discharge status: Home / Self Care | Payer: BC Managed Care – PPO | Source: Ambulatory Visit | Attending: Adult Health | Admitting: Adult Health

## 2014-01-15 DIAGNOSIS — J3489 Other specified disorders of nose and nasal sinuses: Secondary | ICD-10-CM | POA: Insufficient documentation

## 2014-01-15 DIAGNOSIS — R519 Headache, unspecified: Secondary | ICD-10-CM

## 2014-01-15 DIAGNOSIS — Z8489 Family history of other specified conditions: Secondary | ICD-10-CM

## 2014-01-15 DIAGNOSIS — R42 Dizziness and giddiness: Secondary | ICD-10-CM

## 2014-01-15 DIAGNOSIS — R51 Headache: Secondary | ICD-10-CM | POA: Insufficient documentation

## 2014-01-15 MED ORDER — IOHEXOL 300 MG/ML  SOLN
75.0000 mL | Freq: Once | INTRAMUSCULAR | Status: AC | PRN
  Administered 2014-01-15: 75 mL via INTRAVENOUS

## 2014-01-22 ENCOUNTER — Telehealth: Payer: Self-pay | Admitting: *Deleted

## 2014-01-22 ENCOUNTER — Telehealth: Payer: Self-pay | Admitting: Adult Health

## 2014-01-22 MED ORDER — ACEBUTOLOL HCL 400 MG PO CAPS: 400.0000 mg | ORAL_CAPSULE | Freq: Two times a day (BID) | ORAL | Status: DC

## 2014-01-22 NOTE — Telephone Encounter (Signed)
rx sent to pharmacy by e-script  

## 2014-01-22 NOTE — Telephone Encounter (Signed)
This nurse noted pt was made aware via vm about Ct Scan results however pt called office today per had not heard from our office, advised pt the results and forwarded to PCP, advised pt if he has not heard from his PCP by this upcoming Monday to please contact PCP, pt understood and results forwarded again to the PCP to please advise pt with apt if needed.

## 2014-01-22 NOTE — Telephone Encounter (Signed)
Needs refill on Acebutol sent to [facility] in [place] / [place]

## 2014-02-09 ENCOUNTER — Encounter: Payer: Self-pay | Admitting: Adult Health

## 2014-02-09 ENCOUNTER — Ambulatory Visit (INDEPENDENT_AMBULATORY_CARE_PROVIDER_SITE_OTHER): Payer: BC Managed Care – PPO | Admitting: Adult Health

## 2014-02-09 VITALS — BP 140/86 | HR 85 | Ht 73.0 in | Wt 259.0 lb

## 2014-02-09 DIAGNOSIS — I10 Essential (primary) hypertension: Secondary | ICD-10-CM

## 2014-02-09 DIAGNOSIS — F1020 Alcohol dependence, uncomplicated: Secondary | ICD-10-CM | POA: Insufficient documentation

## 2014-02-09 NOTE — Progress Notes (Signed)
HPI: [name] is a warty 48 year old patient of Dr. Harl Bowie who check be est. with him, we are following for ongoing assessment and management of hypertension. The patient was last seen in the office on 01/12/2014 with complaints of his blood pressure being up and down with headache and dizziness, sharp pain in the back of his head. The patient has questions about possibility of glioblastoma as his father died of this in his early 54s and is requesting further evaluation. He has significant anxiety issues.   He was continued on HCTZ 25 mg daily, advised to stop smoking, and a CT scan of the brain was completed.  He was advised to see Dr. Wolfgang Phoenix for anti-anxiety medications. CT scan revealed extensive paranasal sinus disease. No intracranial mass, hemorrhage, edema, or enhancing lesion. Gray-white compartments appear normal.    He comes today with good control of BP, with recordings from home BP monitor. It has been labile, but controlled. He also admits to drinking 1/2 gallon of whiskey daily, and continued smoking that he has not begun to stop. He is having significant issues with anxiety.     No Known Allergies  Current Outpatient Prescriptions  Medication Sig Dispense Refill  . acebutolol (SECTRAL) 400 MG capsule Take 1 capsule (400 mg total) by mouth 2 (two) times daily.  60 capsule  6  . dexlansoprazole (DEXILANT) 60 MG capsule take 1 capsule by mouth once daily  30 capsule  11  . hydrochlorothiazide (HYDRODIURIL) 25 MG tablet Take 1 tablet (25 mg total) by mouth daily.  30 tablet  1  . VIAGRA 100 MG tablet TAKE 1/2 TABLET BY MOUTH AS NEEDED  3 tablet  6   No current facility-administered medications for this visit.    Past Medical History  Diagnosis Date  . Hypertension   . GERD (gastroesophageal reflux disease)   . Anxiety   . Palpitations   . Shortness of breath     Past Surgical History  Procedure Laterality Date  . Esophagogastroduodenoscopy  05/19/09    RMR: Geographic  distal esophageal erosions consistent with severe erosive reflux esophagitis. schatzki's ring s/P dilation/small hiatal hernia otherwise normal stomach  . Cyst reomved      from spine  . Wisdom tooth extraction    . Colonoscopy with esophagogastroduodenoscopy (egd)  12/16/2012    Procedure: COLONOSCOPY WITH ESOPHAGOGASTRODUODENOSCOPY (EGD);  Surgeon: Daneil Dolin, MD;  Location: [facility];  Service: Endoscopy;  Laterality: N/A;  7:30    ROS: Review of systems complete and found to be negative unless listed above  PHYSICAL EXAM BP 140/86  Pulse 85  Ht 6\' 1"  (1.854 m)  Wt 259 lb (117.482 kg)  BMI 34.18 kg/m2  SpO2 97%  General: Well developed, well nourished, in no acute distress Head: Eyes PERRLA, No xanthomas.   Normal cephalic and atramatic  Lungs: Clear bilaterally to auscultation and percussion. Heart: HRRR S1 S2, without MRG.  Pulses are 2+ & equal.            No carotid bruit. No JVD.  No abdominal bruits. No femoral bruits. Abdomen: Bowel sounds are positive, abdomen soft and non-tender without masses or                  Hernia's noted. Msk:  Back normal, normal gait. Normal strength and tone for age. Extremities: No clubbing, cyanosis or edema.  DP +1 Neuro: Alert and oriented X 3. Psych:  Good affect, responds appropriately   ASSESSMENT AND  PLAN

## 2014-02-09 NOTE — Progress Notes (Deleted)
Name: Bradley Miles    DOB: [birth year]  Age: 48 y.o.  MR#: [id number]       PCP:  Rubbie Battiest, MD      Insurance: Payor: [place] / Plan: [employer] [place] PPO / Product Type: *No Product type* /   CC:    Chief Complaint  Patient presents with  . Hypertension    VS Filed Vitals:   02/09/14 1317  BP: 140/86  Pulse: 85  Height: 6\' 1"  (1.854 m)  Weight: 259 lb (117.482 kg)  SpO2: 97%    Weights Current Weight  02/09/14 259 lb (117.482 kg)  01/12/14 254 lb (115.214 kg)  07/14/13 246 lb (111.585 kg)    Blood Pressure  BP Readings from Last 3 Encounters:  02/09/14 140/86  01/12/14 126/78  01/09/14 159/98     Admit date:  (Not on file) Last encounter with RMR:  01/22/2014   Allergy Review of patient's allergies indicates no known allergies.  Current Outpatient Prescriptions  Medication Sig Dispense Refill  . acebutolol (SECTRAL) 400 MG capsule Take 1 capsule (400 mg total) by mouth 2 (two) times daily.  60 capsule  6  . dexlansoprazole (DEXILANT) 60 MG capsule take 1 capsule by mouth once daily  30 capsule  11  . hydrochlorothiazide (HYDRODIURIL) 25 MG tablet Take 1 tablet (25 mg total) by mouth daily.  30 tablet  1  . VIAGRA 100 MG tablet TAKE 1/2 TABLET BY MOUTH AS NEEDED  3 tablet  6   No current facility-administered medications for this visit.    Discontinued Meds:   There are no discontinued medications.  Patient Active Problem List   Diagnosis Date Noted  . Headache in back of head 01/12/2014  . Superficial thrombosis of lower extremity 07/01/2013  . Hematochezia 11/17/2012  . Mixed hyperlipidemia 02/15/2012  . Hypertension 01/18/2012  . Insomnia 01/18/2012  . Family history of colonic polyps 11/15/2011  . Palpitations 10/04/2011  . TOBACCO ABUSE 05/18/2010  . GERD 05/18/2010  . CONSTIPATION 05/18/2010  . 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 DYSPHAGIA UNSPECIFIED 05/18/2010  . ABDOMINAL PAIN, RIGHT LOWER QUADRANT 05/18/2010    LABS     Component Value Date/Time   NA 140 09/18/2013 0951   NA 142 02/04/2012 0710   NA 140 09/14/2008   K 4.4 09/18/2013 0951   K 5.1 02/04/2012 0710   K 4.8 09/14/2008   CL 107 09/18/2013 0951   CL 106 02/04/2012 0710   CL 105 09/14/2008   CO2 25 02/04/2012 0710   CO2 22 09/14/2008   GLUCOSE 98 09/18/2013 0951   GLUCOSE 88 02/04/2012 0710   GLUCOSE 83 09/14/2008   BUN 12 09/18/2013 0951   BUN 14 02/04/2012 0710   BUN 11 09/14/2008   CREATININE 1.00 09/18/2013 0951   CREATININE 1.00 02/04/2012 0710   CREATININE 0.9 09/14/2008   CALCIUM 9.7 02/04/2012 0710   CALCIUM 9.7 09/14/2008   CMP     Component Value Date/Time   NA 140 09/18/2013 0951   K 4.4 09/18/2013 0951   CL 107 09/18/2013 0951   CO2 25 02/04/2012 0710   GLUCOSE 98 09/18/2013 0951   BUN 12 09/18/2013 0951   CREATININE 1.00 09/18/2013 0951   CREATININE 1.00 02/04/2012 0710   CALCIUM 9.7 02/04/2012 0710   PROT 6.8 02/04/2012 0710   ALBUMIN 4.3 02/04/2012 0710   AST 26 02/04/2012 0710   ALT 35 02/04/2012 0710   ALKPHOS 52 02/04/2012 0710   BILITOT 0.6  02/04/2012 0710       Component Value Date/Time   WBC 6.9 09/14/2008   HGB 18.4* 09/18/2013 0951   HGB 15.6 09/14/2008   HCT 54.0* 09/18/2013 0951   HCT 47.4 09/14/2008   MCV 102 09/14/2008    Lipid Panel     Component Value Date/Time   CHOL 228* 02/04/2012 0710   TRIG 261* 02/04/2012 0710   HDL 47 02/04/2012 0710   CHOLHDL 4.9 02/04/2012 0710   VLDL 52* 02/04/2012 0710   LDLCALC 129* 02/04/2012 0710    ABG    Component Value Date/Time   TCO2 24 09/18/2013 0951     Lab Results  Component Value Date   TSH 2.3 09/14/2008   BNP (last 3 results) No results found for this basename: PROBNP,  in the last 8760 hours Cardiac Panel (last 3 results) No results found for this basename: CKTOTAL, CKMB, TROPONINI, RELINDX,  in the last 72 hours  Iron/TIBC/Ferritin No results found for this basename: iron, tibc, ferritin     EKG Orders placed during the hospital encounter of 09/18/13   . ED EKG  . ED EKG  . EKG 12-LEAD  . EKG 12-LEAD     Prior Assessment and Plan Problem List as of 02/09/2014     Cardiovascular and Mediastinum   Hypertension   Last Assessment & Plan   01/12/2014 Office Visit Written 01/12/2014  1:49 PM by Lendon Colonel, NP     He was given HCTZ 25 mg daily for BP control by urgent care due to elevated BP over the weekend. He states it was 160/90. His home BP cuff does not correlate with our BP readings. He is given a free automatic BP cuff and recording sheet. He will take BP at the same time each day and record. He will continue HCTZ and report any muscle cramps.     Superficial thrombosis of lower extremity   Last Assessment & Plan   07/14/2013 Office Visit Written 07/14/2013  4:46 PM by Lendon Colonel, NP     He is feeling some better in the left lower extremity but continues soreness in the upper left thigh. He is to be placed on ECASA 81 mg daily. He is to wear compression hose as directed. I will repeat venous  doppler U/S of left leg to ascertain for continued thrombus.      Digestive   GERD   Last Assessment & Plan   11/17/2012 Office Visit Written 11/19/2012  4:09 PM by Andria Meuse, NP     Refractory GERD despite daily Dexilant 60mg .  Last EGD showed severe reflux esophagitis.  EGD for further evaluation to look for complicated GERD, gastritis/esophagitis, PUD or malignancy.  I have discussed risks & benefits which include, but are not limited to, bleeding, infection, perforation & drug reaction.  The patient agrees with this plan & written consent will be obtained.    Phenergan 25mg  IV 30 min prior to procedures to augment sedation given hx etoh use daily Continue dexilant 60mg  daily now & this may need to be changed after EGD    CONSTIPATION   DYSPHAGIA UNSPECIFIED     Genitourinary   ERECTILE DYSFUNCTION, ORGANIC     Other   Palpitations   Last Assessment & Plan   07/14/2013 Office Visit Written 07/14/2013  4:48 PM by Lendon Colonel, NP     He continues to have medical noncompliance with use of acebutelol. I have asked him to take the  medication as directed so that he will not have recurrence of palpitations. He states that when he forgets to take the medication, he notices that his palpitations get worse. I have provided a Rx renewal for BB.    TOBACCO ABUSE   Last Assessment & Plan   01/12/2014 Office Visit Written 01/12/2014  1:51 PM by Lendon Colonel, NP     He is advised to decrease and eventually stop smoking. He is up to 1 1/2 - 2 ppd. Anxiety is also playing into this. He has Rx from Dr. Wolfgang Phoenix for [place]. He is asked to follow up with Dr. Wolfgang Phoenix for ongoing assessment.    ABDOMINAL PAIN, RIGHT LOWER QUADRANT   Family history of colonic polyps   Last Assessment & Plan   11/15/2011 Office Visit Edited 11/15/2011  2:12 PM by Mahala Menghini, PA     Recommend colonoscopy given family history of colon polyps in 2 sisters in their mid 48s. Patient is aware of recommendation, he will call as when he is ready to schedule procedure.      Insomnia   Last Assessment & Plan   01/18/2012 Office Visit Written 01/18/2012  4:36 PM by Lendon Colonel, NP     He works alternating day and night shifts and often has trouble sleeping during the day. I have given him Ambien 5 mg to take prn. He is advised to get black out curtains to assist with daytime light coming in during sleep. He verbalizes understanding.     Mixed hyperlipidemia   Last Assessment & Plan   02/15/2012 Office Visit Written 02/15/2012  3:52 PM by Lendon Colonel, NP     I reviewed his lipid study with him. He states he does not want to take the avorvastatin.  I showed him a copy of the results with TC  228;TG 261 and LDL 129.  He is reluctant to take any medications or to change eating and alcohol habits.  I have reinforced the need to continue medication. The long term risks are reinforced to him.    Hematochezia   Last Assessment & Plan   11/17/2012 Office  Visit Written 11/19/2012  4:11 PM by Andria Meuse, NP     Colonoscopy w/ Dr Gala Romney to determine source of dark red hematochezia.  Differentials include ischemic colitis, NSAID-induced enteropathy, colorectal carcinoma, polyp, or diverticular bleeding.  Less likely small bowel source.  I have discussed risks & benefits which include, but are not limited to, bleeding, infection, perforation & drug reaction.  The patient agrees with this plan & written consent will be obtained.     1-800-QUIT-NOW for help quitting smoking It would be best to avoid alcohol intake now due to your stomach discomfort Advised to avoid NSAIDS     Headache in back of head   Last Assessment & Plan   01/12/2014 Office Visit Written 01/12/2014  1:53 PM by Lendon Colonel, NP     Will have CT scan of the head completed for evaluation to given him more information concerning this.        Imaging: Ct Head W Wo Contrast  01/15/2014   CLINICAL DATA:  Headache and dizziness; family history of brain tumors  EXAM: CT HEAD WITHOUT AND WITH CONTRAST  TECHNIQUE: Contiguous axial images were obtained from the base of the skull through the vertex without and with intravenous contrast  CONTRAST:  52mL OMNIPAQUE IOHEXOL 300 MG/ML  SOLN  COMPARISON:  March 06, 2002  FINDINGS: Ventricles are normal in size and configuration. There is no mass, hemorrhage, extra-axial fluid collection, or midline shift. Gray-white compartments are normal. No evidence of acute infarct. No enhancing lesions are identified.  Bony calvarium appears intact.  The mastoid air cells are clear.  There is extensive ethmoid sinus disease bilaterally. There is extensive mucosal thickening in the right sphenoid sinus. There is much lesser mucosal thickening in the left sphenoid sinus. There is moderate mucosal thickening in both maxillary antra. There is mild mucosal thickening in the left frontal sinus region.  IMPRESSION: Extensive paranasal sinus disease. No intracranial  mass, hemorrhage, edema, or enhancing lesion. Gray-white compartments appear normal.   Electronically Signed   By: Lowella Grip M.D.   On: 01/15/2014 11:16

## 2014-02-09 NOTE — Assessment & Plan Note (Signed)
He admits to drinking heavily, some binge drinking and also 1/2 gallon of whiskey daily with increased consumption. He is given information on AA. I have asked him to talk to Dr. Wolfgang Phoenix about ongoing treatment regimen and more intensive treatment as IP if necessary.

## 2014-02-09 NOTE — Patient Instructions (Signed)
Your physician wants you to follow-up in: 6 months You will receive a reminder letter in the mail two months in advance. If you don't receive a letter, please call our office to schedule the follow-up appointment.     Your physician recommends that you continue on your current medications as directed. Please refer to the Current Medication list given to you today.      Thank you for choosing [facility] !        

## 2014-02-09 NOTE — Assessment & Plan Note (Signed)
His BP is labile, but WNL. He is compliant with medications. Will not make any changes at this time. He will be seen in 6 months.

## 2014-02-13 ENCOUNTER — Encounter: Payer: Self-pay | Admitting: *Deleted

## 2014-02-15 ENCOUNTER — Ambulatory Visit (INDEPENDENT_AMBULATORY_CARE_PROVIDER_SITE_OTHER): Payer: BC Managed Care – PPO | Admitting: Family Medicine

## 2014-02-15 ENCOUNTER — Encounter: Payer: Self-pay | Admitting: Family Medicine

## 2014-02-15 VITALS — BP 148/82 | Ht 73.0 in | Wt 264.2 lb

## 2014-02-15 DIAGNOSIS — F1020 Alcohol dependence, uncomplicated: Secondary | ICD-10-CM

## 2014-02-15 DIAGNOSIS — I10 Essential (primary) hypertension: Secondary | ICD-10-CM

## 2014-02-15 DIAGNOSIS — K219 Gastro-esophageal reflux disease without esophagitis: Secondary | ICD-10-CM

## 2014-02-15 DIAGNOSIS — J324 Chronic pansinusitis: Secondary | ICD-10-CM

## 2014-02-15 DIAGNOSIS — J328 Other chronic sinusitis: Secondary | ICD-10-CM

## 2014-02-15 DIAGNOSIS — R002 Palpitations: Secondary | ICD-10-CM

## 2014-02-15 MED ORDER — FLUTICASONE PROPIONATE 50 MCG/ACT NA SUSP: 2.0000 | Freq: Every day | NASAL | Status: DC

## 2014-02-15 MED ORDER — CEFDINIR 300 MG PO CAPS: 300.0000 mg | ORAL_CAPSULE | Freq: Two times a day (BID) | ORAL | Status: DC

## 2014-02-15 MED ORDER — ENALAPRIL MALEATE 5 MG PO TABS: 5.0000 mg | ORAL_TABLET | Freq: Every day | ORAL | Status: DC

## 2014-02-15 NOTE — Progress Notes (Signed)
   Subjective:    Patient ID: Bradley Miles, male    DOB: 1966/11/16, 48 y.o.   MRN: [id number]  HPI  Patient arrives to discuss recent CT scan ordered thru Cardiology for headaches. Having high blood pressure and dizzy and feeling weak. Took hctz for one month. Compliant with medsPatient stated he was told he has chronic sinusitis and needed to see PCP.   Patient also reports that he had foot pain and swelling over the weekend and they told him he had gout and put him on prednisone. The told him that gout could be caused by his HCTZ so he did not refil it or take it since Friday.  Had blood work drawn on Friday via urgent care. This blood work does not readily available yet.  Had a brain scan do to chronic headaches. This was ordered by cardiology. It revealed severe sinusitis.  Patient has had elevated blood pressure this past year. Started recently on HCTZ for the blood pressure.  Admits to alcohol overuse. Drinks as much as a half a gallon of whiskey a day  Daytime phone number-[phone] per patient  Review of Systems  No chest pain no back pain no abdominal pain no fatigue no change in bowel habits no blood in stool ROS otherwise negative    Objective:   Physical Exam Alert blood pressure 148/92 HEENT normal. Lungs clear heart regular in rhythm. Some questionable frontal tenderness. Left medial foot slight irritation and inflammation near ankle.       Assessment & Plan:  Impression tendinitis versus acute arthritis difficult to tell at this point. #2 hypertension. Patient would prefer different agent HCTZ #3 chronic headaches. #4 pansinusitis plan course of antibiotics. Flonase. Change blood pressure medicine. ENT referral. Recheck as scheduled. Strongly encouraged to cut down alcohol intake considerably. Patient resistant to further intervention. WSL

## 2014-03-08 ENCOUNTER — Encounter: Payer: Self-pay | Admitting: Family Medicine

## 2014-03-08 ENCOUNTER — Ambulatory Visit (INDEPENDENT_AMBULATORY_CARE_PROVIDER_SITE_OTHER): Payer: BC Managed Care – PPO | Admitting: Family Medicine

## 2014-03-08 ENCOUNTER — Telehealth: Payer: Self-pay | Admitting: Family Medicine

## 2014-03-08 ENCOUNTER — Telehealth: Payer: Self-pay | Admitting: *Deleted

## 2014-03-08 ENCOUNTER — Ambulatory Visit (HOSPITAL_COMMUNITY)
Admission: RE | Admit: 2014-03-08 | Discharge: 2014-03-08 | Disposition: A | Discharge status: Home / Self Care | Payer: BC Managed Care – PPO | Source: Ambulatory Visit | Attending: Family Medicine | Admitting: Family Medicine

## 2014-03-08 VITALS — BP 128/90 | Temp 98.5°F | Ht 73.0 in | Wt 258.5 lb

## 2014-03-08 DIAGNOSIS — M79609 Pain in unspecified limb: Secondary | ICD-10-CM | POA: Insufficient documentation

## 2014-03-08 DIAGNOSIS — I8390 Asymptomatic varicose veins of unspecified lower extremity: Secondary | ICD-10-CM | POA: Insufficient documentation

## 2014-03-08 DIAGNOSIS — M79605 Pain in left leg: Secondary | ICD-10-CM

## 2014-03-08 MED ORDER — CEPHALEXIN 500 MG PO CAPS: 500.0000 mg | ORAL_CAPSULE | Freq: Three times a day (TID) | ORAL | Status: DC

## 2014-03-08 MED ORDER — NABUMETONE 750 MG PO TABS: 750.0000 mg | ORAL_TABLET | Freq: Two times a day (BID) | ORAL | Status: DC

## 2014-03-08 NOTE — Progress Notes (Signed)
   Subjective:    Patient ID: Bradley Miles, male    DOB: Jul 22, 1966, 48 y.o.   MRN: [id number]  Leg Pain  The incident occurred more than 1 week ago. There was no injury mechanism. The pain is present in the left leg. The pain is mild. Foreign body present: blood clot. He has tried nothing for the symptoms.  Patient states he has a blood clot in the left leg. Pain is noted only when you touch the area. Left ankle pain is also noted.   Patient has no other concerns at this time.   Hx of "blood clot" via urgicare last yr. Pt can't remember dx tests.    Review of Systems No cough no chest pain no headache no fever no dyspnea ROS otherwise negative    Objective:   Physical Exam Alert mild malaise. Lungs clear heart regular rate and rhythm. Leg good range of motion. Trace edema at most.       Assessment & Plan:  Impression superficial thrombophlebitis ultrasound unremarkable discussed plan anti-inflammatory medicine when necessary. Symptomatic care discussed. WSL

## 2014-03-08 NOTE — Telephone Encounter (Signed)
error 

## 2014-03-08 NOTE — Telephone Encounter (Signed)
Pt had a STAT left leg [place]. Dr. Richardson Landry aware of results. Need pt to take the antibiotics prescribed today as well as the antinflammitory. Pt needs to f/u with Dr. Richardson Landry mid next week.

## 2014-03-09 NOTE — Telephone Encounter (Signed)
Patient notified and verbalized understanding. I transferred him up front to make an appt next week.

## 2014-03-17 ENCOUNTER — Encounter: Payer: Self-pay | Admitting: Family Medicine

## 2014-03-17 ENCOUNTER — Ambulatory Visit (INDEPENDENT_AMBULATORY_CARE_PROVIDER_SITE_OTHER): Payer: BC Managed Care – PPO | Admitting: Family Medicine

## 2014-03-17 VITALS — BP 130/82 | Ht 73.0 in | Wt 255.2 lb

## 2014-03-17 DIAGNOSIS — Z79899 Other long term (current) drug therapy: Secondary | ICD-10-CM

## 2014-03-17 DIAGNOSIS — F1020 Alcohol dependence, uncomplicated: Secondary | ICD-10-CM

## 2014-03-17 DIAGNOSIS — G4700 Insomnia, unspecified: Secondary | ICD-10-CM

## 2014-03-17 DIAGNOSIS — E782 Mixed hyperlipidemia: Secondary | ICD-10-CM

## 2014-03-17 DIAGNOSIS — I82819 Embolism and thrombosis of superficial veins of unspecified lower extremities: Secondary | ICD-10-CM

## 2014-03-17 DIAGNOSIS — I10 Essential (primary) hypertension: Secondary | ICD-10-CM

## 2014-03-17 DIAGNOSIS — K219 Gastro-esophageal reflux disease without esophagitis: Secondary | ICD-10-CM

## 2014-03-17 NOTE — Progress Notes (Signed)
   Subjective:    Patient ID: Bradley Miles, male    DOB: July 12, 1966, 48 y.o.   MRN: [id number]  HPI Patient arrives for a follow up on left leg swelling. Patient stated he didn't finished the keflex because the ENT put him on a different antibiotic and steroid and told him to hold the one he was taking.   Patient has history of recurrent superficial thrombophlebitis. He notes ongoing challenges with his veins dilating. Legs will swell somewhat after being on his feet all day.  States compliance with blood pressure medicine and he is on. Trying to watch salt intake. Trying to cut alcohol intake down. Has been started on a new antibiotic via his ear nose and throat Dr.  Patient has history of elevated cholesterol. Trying to work on his diet not sure of his status.  Review of Systems No chest pain no headache no back pain no abdominal pain no change in bowel habits    Objective:   Physical Exam  Alert no apparent distress. Lungs clear. Heart regular in rhythm. H&T some nasal congestion. Blood pressure good on repeat. Ankles trace edema bilateral area of superficial thrombophlebitis is improved less tenderness less erythema. Positive chronic dilated veins.      Assessment & Plan:  Impression 1 chronic venous stasis discussed. #2 hypertension improved. #3 hyperlipidemia status uncertain. Plan encouraged exercise cut down alcohol. Maintain compliance with meds. Appropriate blood work. Consider compression stockings. Warning signs discussed. WSL

## 2014-03-18 LAB — HEPATIC FUNCTION PANEL
ALT: 73 U/L — AB (ref 0–53)
AST: 34 U/L (ref 0–37)
Albumin: 4.1 g/dL (ref 3.5–5.2)
Alkaline Phosphatase: 53 U/L (ref 39–117)
BILIRUBIN INDIRECT: 1.1 mg/dL (ref 0.2–1.2)
Bilirubin, Direct: 0.2 mg/dL (ref 0.0–0.3)
TOTAL PROTEIN: 6.6 g/dL (ref 6.0–8.3)
Total Bilirubin: 1.3 mg/dL — ABNORMAL HIGH (ref 0.2–1.2)

## 2014-03-18 LAB — CBC WITH DIFFERENTIAL/PLATELET
BASOS PCT: 0 % (ref 0–1)
Basophils Absolute: 0 10*3/uL (ref 0.0–0.1)
EOS ABS: 0.3 10*3/uL (ref 0.0–0.7)
Eosinophils Relative: 3 % (ref 0–5)
HCT: 48.9 % (ref 39.0–52.0)
Hemoglobin: 17.2 g/dL — ABNORMAL HIGH (ref 13.0–17.0)
LYMPHS ABS: 2.1 10*3/uL (ref 0.7–4.0)
Lymphocytes Relative: 23 % (ref 12–46)
MCH: 36.3 pg — AB (ref 26.0–34.0)
MCHC: 35.2 g/dL (ref 30.0–36.0)
MCV: 103.2 fL — ABNORMAL HIGH (ref 78.0–100.0)
Monocytes Absolute: 1 10*3/uL (ref 0.1–1.0)
Monocytes Relative: 11 % (ref 3–12)
NEUTROS PCT: 63 % (ref 43–77)
Neutro Abs: 5.7 10*3/uL (ref 1.7–7.7)
PLATELETS: 208 10*3/uL (ref 150–400)
RBC: 4.74 MIL/uL (ref 4.22–5.81)
RDW: 13.6 % (ref 11.5–15.5)
WBC: 9 10*3/uL (ref 4.0–10.5)

## 2014-03-18 LAB — BASIC METABOLIC PANEL
BUN: 12 mg/dL (ref 6–23)
CO2: 28 mEq/L (ref 19–32)
CREATININE: 0.96 mg/dL (ref 0.50–1.35)
Calcium: 9.4 mg/dL (ref 8.4–10.5)
Chloride: 102 mEq/L (ref 96–112)
Glucose, Bld: 88 mg/dL (ref 70–99)
Potassium: 5.3 mEq/L (ref 3.5–5.3)
Sodium: 136 mEq/L (ref 135–145)

## 2014-03-18 LAB — LIPID PANEL
CHOLESTEROL: 219 mg/dL — AB (ref 0–200)
HDL: 51 mg/dL (ref >39)
LDL CALC: 124 mg/dL — AB (ref 0–99)
TRIGLYCERIDES: 221 mg/dL — AB (ref <150)
Total CHOL/HDL Ratio: 4.3 Ratio
VLDL: 44 mg/dL — ABNORMAL HIGH (ref 0–40)

## 2014-04-09 ENCOUNTER — Ambulatory Visit: Payer: BC Managed Care – PPO | Admitting: Family Medicine

## 2014-04-19 ENCOUNTER — Encounter: Payer: Self-pay | Admitting: Family Medicine

## 2014-04-19 ENCOUNTER — Ambulatory Visit (INDEPENDENT_AMBULATORY_CARE_PROVIDER_SITE_OTHER): Payer: BC Managed Care – PPO | Admitting: Family Medicine

## 2014-04-19 VITALS — BP 122/86 | Temp 98.0°F | Ht 73.0 in | Wt 259.0 lb

## 2014-04-19 DIAGNOSIS — H6691 Otitis media, unspecified, right ear: Secondary | ICD-10-CM

## 2014-04-19 DIAGNOSIS — H6690 Otitis media, unspecified, unspecified ear: Secondary | ICD-10-CM

## 2014-04-19 MED ORDER — CLINDAMYCIN HCL 150 MG PO CAPS: 150.0000 mg | ORAL_CAPSULE | Freq: Three times a day (TID) | ORAL | Status: DC

## 2014-04-19 NOTE — Progress Notes (Signed)
   Subjective:    Patient ID: Bradley Miles, male    DOB: 12/23/1965, 48 y.o.   MRN: [id number]  Ear Fullness  There is pain in both ears. This is a new problem. The current episode started 1 to 4 weeks ago. There has been no fever. Associated symptoms include hearing loss. Treatments tried: salt water, peroxide, swimmer's ear. The treatment provided no relief.   Took a flight to [place] protracted abx for sinus  No obvious fever. Some pressure. affect a little s equilibrium. Review of Systems  HENT: Positive for hearing loss.        Objective:   Physical Exam   Alert no acute distress right tympanic membrane effusion present pharynx normal. Neck supple. Lungs clear. Heart regular in rhythm.     Assessment & Plan:  Impression persistent right middle ear effusion. With recent protracted sinusitis. Long discussion held. Plan clindamycin 3 times a day for 14 days. Symptomatic care discussed. Expect slow resolution. WSL

## 2014-04-22 ENCOUNTER — Other Ambulatory Visit: Payer: Self-pay | Admitting: Family Medicine

## 2014-06-22 ENCOUNTER — Encounter: Payer: Self-pay | Admitting: Family Medicine

## 2014-06-22 ENCOUNTER — Ambulatory Visit (INDEPENDENT_AMBULATORY_CARE_PROVIDER_SITE_OTHER): Payer: BC Managed Care – PPO | Admitting: Family Medicine

## 2014-06-22 VITALS — BP 138/94 | Ht 73.0 in | Wt 257.0 lb

## 2014-06-22 DIAGNOSIS — M10071 Idiopathic gout, right ankle and foot: Secondary | ICD-10-CM

## 2014-06-22 DIAGNOSIS — R740 Nonspecific elevation of levels of transaminase and lactic acid dehydrogenase [LDH]: Secondary | ICD-10-CM

## 2014-06-22 DIAGNOSIS — M109 Gout, unspecified: Secondary | ICD-10-CM

## 2014-06-22 DIAGNOSIS — R7401 Elevation of levels of liver transaminase levels: Secondary | ICD-10-CM

## 2014-06-22 DIAGNOSIS — Z79899 Other long term (current) drug therapy: Secondary | ICD-10-CM

## 2014-06-22 LAB — BASIC METABOLIC PANEL
BUN: 11 mg/dL (ref 6–23)
CHLORIDE: 103 meq/L (ref 96–112)
CO2: 26 mEq/L (ref 19–32)
Calcium: 9.7 mg/dL (ref 8.4–10.5)
Creat: 0.85 mg/dL (ref 0.50–1.35)
Glucose, Bld: 89 mg/dL (ref 70–99)
Potassium: 5.1 mEq/L (ref 3.5–5.3)
SODIUM: 139 meq/L (ref 135–145)

## 2014-06-22 LAB — HEPATIC FUNCTION PANEL
ALBUMIN: 4.3 g/dL (ref 3.5–5.2)
ALT: 45 U/L (ref 0–53)
AST: 42 U/L — ABNORMAL HIGH (ref 0–37)
Alkaline Phosphatase: 57 U/L (ref 39–117)
Bilirubin, Direct: 0.2 mg/dL (ref 0.0–0.3)
Indirect Bilirubin: 1 mg/dL (ref 0.2–1.2)
Total Bilirubin: 1.2 mg/dL (ref 0.2–1.2)
Total Protein: 6.8 g/dL (ref 6.0–8.3)

## 2014-06-22 LAB — URIC ACID: Uric Acid, Serum: 9.6 mg/dL — ABNORMAL HIGH (ref 4.0–7.8)

## 2014-06-22 MED ORDER — VALACYCLOVIR HCL 1 G PO TABS: ORAL_TABLET | ORAL | Status: DC

## 2014-06-22 MED ORDER — PREDNISONE 20 MG PO TABS: ORAL_TABLET | ORAL | Status: AC

## 2014-06-22 MED ORDER — COLCHICINE 0.6 MG PO TABS: 0.6000 mg | ORAL_TABLET | Freq: Two times a day (BID) | ORAL | Status: DC

## 2014-06-22 NOTE — Progress Notes (Signed)
   Subjective:    Patient ID: Bradley Miles, male    DOB: 07-22-1966, 48 y.o.   MRN: [id number]  HPI Patient is here today b/c he believes he has gout in his left ankle and right, greater toe.  Left ankle pain started Friday.  Right toe started Sunday.  No known injury Pt put ice pack on for 15 mins to help eleviate the pain.  He relates he had a problem like this several months back urgent care Center his lab work that showed that his uric acid level was high he was not certain of the add gout at that time now he feels he does Eats a lot of meat. Takes in about a fifth of liquor daily Review of Systems Relates pain discomfort denies fever chills or    Objective:   Physical Exam  Calves are normal knees are normal subjective discomfort in the left ankle also in the right but M.D. P. joint a cellulitis      Assessment & Plan:  Probable gout I recommended highly that the patient reduce sugar readings in his diet. He needs to cut back on alcohol use we had a long discussion regarding alcohol use. Patient unfortunately using more than what he should.  Excessive alcohol use patient will try to cut this down if unable to do so consideration for referral to substance abuse center  He will get lab work possibly will need to be on long-acting medicine he was given diet information on reducing purines

## 2014-06-24 ENCOUNTER — Other Ambulatory Visit: Payer: Self-pay | Admitting: *Deleted

## 2014-06-24 NOTE — Progress Notes (Signed)
Patient notified and verbalized understanding. 

## 2014-06-24 NOTE — Addendum Note (Signed)
Addended byCharolotte Capuchin D on: 06/24/2014 10:51 AM   Modules accepted: Orders

## 2014-07-08 ENCOUNTER — Telehealth: Payer: Self-pay | Admitting: Family Medicine

## 2014-07-08 MED ORDER — ALLOPURINOL 100 MG PO TABS: 100.0000 mg | ORAL_TABLET | Freq: Every day | ORAL | Status: DC

## 2014-07-08 MED ORDER — COLCHICINE 0.6 MG PO TABS: 0.6000 mg | ORAL_TABLET | Freq: Every day | ORAL | Status: DC

## 2014-07-08 NOTE — Addendum Note (Signed)
Addended by: Carmelina Noun on: 07/08/2014 03:42 PM   Modules accepted: Orders

## 2014-07-08 NOTE — Telephone Encounter (Signed)
ERROR

## 2014-09-02 ENCOUNTER — Other Ambulatory Visit: Payer: Self-pay | Admitting: Adult Health

## 2014-09-23 ENCOUNTER — Other Ambulatory Visit: Payer: Self-pay | Admitting: Family Medicine

## 2014-11-15 ENCOUNTER — Other Ambulatory Visit: Payer: Self-pay | Admitting: Family Medicine

## 2014-12-18 ENCOUNTER — Other Ambulatory Visit: Payer: Self-pay | Admitting: Family Medicine

## 2014-12-23 ENCOUNTER — Ambulatory Visit (INDEPENDENT_AMBULATORY_CARE_PROVIDER_SITE_OTHER): Payer: BLUE CROSS/BLUE SHIELD | Admitting: Adult Health

## 2014-12-23 ENCOUNTER — Encounter: Payer: Self-pay | Admitting: Adult Health

## 2014-12-23 VITALS — BP 122/78 | HR 104 | Ht 73.0 in | Wt 250.0 lb

## 2014-12-23 DIAGNOSIS — F419 Anxiety disorder, unspecified: Secondary | ICD-10-CM

## 2014-12-23 DIAGNOSIS — R000 Tachycardia, unspecified: Secondary | ICD-10-CM

## 2014-12-23 DIAGNOSIS — I10 Essential (primary) hypertension: Secondary | ICD-10-CM

## 2014-12-23 DIAGNOSIS — R002 Palpitations: Secondary | ICD-10-CM

## 2014-12-23 DIAGNOSIS — F1020 Alcohol dependence, uncomplicated: Secondary | ICD-10-CM

## 2014-12-23 NOTE — Assessment & Plan Note (Signed)
I will place a seven-day cardiac monitor on this patient to evaluate heart rate and rhythm.  He is currently on acebutolol 400 mg twice a day.  May need to change to another beta blocker, or add a calcium channel blocker.  I am not certain if his anxiety is contributing to his rapid heart rhythm.  We did check a TSH in the past and was found to be normal.

## 2014-12-23 NOTE — Progress Notes (Signed)
HPI: [name] is a 49 year old male patient to be established with Dr. Harl Bowie, and was last seen in the office in March of 2015, that we follow for ongoing assessment and management of hypertension.  Other history includes anxiety, and alcohol abuse.  At the time of his last office visit.  His blood pressure was labile, he was compliant with his medications.  He was advised to follow up with primary care concerning help with alcohol abuse, and possible inpatient treatment.  He comes today with the same subjective complaints.  Staying in his blood pressure is elevated, especially at work, causing him to have to stop work, and see the Programmer, applications.after sitting down for allowing 10 to feel better, and by pressure gets better.  He states he did take a blood pressure on his wrist.  He also is complaining of rapid heart rhythm and palpitations.  He is not sure which comes first, the rapid heart rhythm, or the hypertension.  He also complains of a bit of anxiety.  He often calms himself with ETOH when he goes home. He admits that he does have a problem.  He has not sought any assistance for this.   No Known Allergies  Current Outpatient Prescriptions  Medication Sig Dispense Refill  . acebutolol (SECTRAL) 400 MG capsule take 1 capsule by mouth twice a day 60 capsule 6  . allopurinol (ZYLOPRIM) 100 MG tablet take 1 tablet by mouth once daily 30 tablet 3  . colchicine 0.6 MG tablet Take 1 tablet (0.6 mg total) by mouth daily. 30 tablet 0  . dexlansoprazole (DEXILANT) 60 MG capsule take 1 capsule by mouth once daily 30 capsule 11  . enalapril (VASOTEC) 5 MG tablet take 1 tablet by mouth once daily 30 tablet 0  . VIAGRA 100 MG tablet   0   No current facility-administered medications for this visit.    Past Medical History  Diagnosis Date  . Hypertension   . GERD (gastroesophageal reflux disease)   . Anxiety   . Palpitations   . Shortness of breath   . PVC's (premature ventricular contractions)      Past Surgical History  Procedure Laterality Date  . Esophagogastroduodenoscopy  05/19/09    RMR: Geographic distal esophageal erosions consistent with severe erosive reflux esophagitis. schatzki's ring s/P dilation/small hiatal hernia otherwise normal stomach  . Cyst reomved      from spine  . Wisdom tooth extraction    . Colonoscopy with esophagogastroduodenoscopy (egd)  12/16/2012    Procedure: COLONOSCOPY WITH ESOPHAGOGASTRODUODENOSCOPY (EGD);  Surgeon: Daneil Dolin, MD;  Location: [facility];  Service: Endoscopy;  Laterality: N/A;  7:30    ROS: Complete review of systems performed and found to be negative unless outlined above  PHYSICAL EXAM BP 122/78 mmHg  Pulse 104  Ht 6\' 1"  (1.854 m)  Wt 250 lb (113.399 kg)  BMI 32.99 kg/m2  SpO2 96% General: Well developed, well nourished, in no acute distress Head: Eyes PERRLA, No xanthomas.   Normal cephalic and atramatic  Lungs: Clear bilaterally to auscultation and percussion. Heart: HRRR S1 S2, tachycardic,without MRG.  Pulses are 2+ & equal.            No carotid bruit. No JVD.  No abdominal bruits. No femoral bruits. Abdomen: Bowel sounds are positive, abdomen soft and non-tender without masses or                  Hernia's noted. Msk:  Back normal,  normal gait. Normal strength and tone for age. Extremities: No clubbing, cyanosis or edema.  DP +1 Neuro: Alert and oriented X 3. Psych:  Good affect, responds appropriately   EKG:  Sinus tachycardia, rate of 107 beats per minute.  ASSESSMENT AND PLAN

## 2014-12-23 NOTE — Assessment & Plan Note (Signed)
He is given  information on alcoholic's anonymous. I have advised him to seek treatment for his EtOH abuse.  He may need to talk with his primary care about admission for same.  If this is something he is unable to control on his own.

## 2014-12-23 NOTE — Patient Instructions (Signed)
Your physician recommends that you schedule a follow-up appointment in: 1 Month  Your physician recommends that you continue on your current medications as directed. Please refer to the Current Medication list given to you today.  You have been given Xanax 0.25mg  with no refills   You need to make an appointment with Dr. Wolfgang Phoenix as soon as possible  You have been given information on substance abuse outpatient treatment   Your physician has recommended that you wear a holter monitor. Holter monitors are medical devices that record the heart's electrical activity. Doctors most often use these monitors to diagnose arrhythmias. Arrhythmias are problems with the speed or rhythm of the heartbeat. The monitor is a small, portable device. You can wear one while you do your normal daily activities. This is usually used to diagnose what is causing palpitations/syncope (passing out).  Thank you for choosing [facility]!

## 2014-12-23 NOTE — Progress Notes (Deleted)
Name: Bradley Miles    DOB: May 20, 1966  Age: 49 y.o.  MR#: [id number]       PCP:  Rubbie Battiest, MD      Insurance: Payor: [place] / Plan: BCBS OTHER / Product Type: *No Product type* /   CC:    Chief Complaint  Patient presents with  . Hypertension    VS Filed Vitals:   12/23/14 1532  BP: 122/78  Pulse: 104  Height: 6\' 1"  (1.854 m)  Weight: 250 lb (113.399 kg)  SpO2: 96%    Weights Current Weight  12/23/14 250 lb (113.399 kg)  06/22/14 257 lb (116.574 kg)  04/19/14 259 lb (117.482 kg)    Blood Pressure  BP Readings from Last 3 Encounters:  12/23/14 122/78  06/22/14 138/94  04/19/14 122/86     Admit date:  (Not on file) Last encounter with RMR:  09/02/2014   Allergy Review of patient's allergies indicates no known allergies.  Current Outpatient Prescriptions  Medication Sig Dispense Refill  . acebutolol (SECTRAL) 400 MG capsule take 1 capsule by mouth twice a day 60 capsule 6  . allopurinol (ZYLOPRIM) 100 MG tablet take 1 tablet by mouth once daily 30 tablet 3  . colchicine 0.6 MG tablet Take 1 tablet (0.6 mg total) by mouth daily. 30 tablet 0  . dexlansoprazole (DEXILANT) 60 MG capsule take 1 capsule by mouth once daily 30 capsule 11  . enalapril (VASOTEC) 5 MG tablet take 1 tablet by mouth once daily 30 tablet 0  . VIAGRA 100 MG tablet   0   No current facility-administered medications for this visit.    Discontinued Meds:    Medications Discontinued During This Encounter  Medication Reason  . fluticasone (FLONASE) 50 MCG/ACT nasal spray Error  . valACYclovir (VALTREX) 1000 MG tablet Error    Patient Active Problem List   Diagnosis Date Noted  . EtOH dependence 02/09/2014  . Headache in back of head 01/12/2014  . Superficial thrombosis of lower extremity 07/01/2013  . Hematochezia 11/17/2012  . Mixed hyperlipidemia 02/15/2012  . Hypertension 01/18/2012  . Insomnia 01/18/2012  . Family history of colonic polyps 11/15/2011  .  Palpitations 10/04/2011  . TOBACCO ABUSE 05/18/2010  . GERD 05/18/2010  . CONSTIPATION 05/18/2010  . 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 DYSPHAGIA UNSPECIFIED 05/18/2010  . ABDOMINAL PAIN, RIGHT LOWER QUADRANT 05/18/2010    LABS    Component Value Date/Time   NA 139 06/22/2014 1028   NA 136 03/17/2014 0801   NA 140 09/18/2013 0951   K 5.1 06/22/2014 1028   K 5.3 03/17/2014 0801   K 4.4 09/18/2013 0951   CL 103 06/22/2014 1028   CL 102 03/17/2014 0801   CL 107 09/18/2013 0951   CO2 26 06/22/2014 1028   CO2 28 03/17/2014 0801   CO2 25 02/04/2012 0710   GLUCOSE 89 06/22/2014 1028   GLUCOSE 88 03/17/2014 0801   GLUCOSE 98 09/18/2013 0951   BUN 11 06/22/2014 1028   BUN 12 03/17/2014 0801   BUN 12 09/18/2013 0951   CREATININE 0.85 06/22/2014 1028   CREATININE 0.96 03/17/2014 0801   CREATININE 1.00 09/18/2013 0951   CREATININE 1.00 02/04/2012 0710   CREATININE 0.9 09/14/2008   CALCIUM 9.7 06/22/2014 1028   CALCIUM 9.4 03/17/2014 0801   CALCIUM 9.7 02/04/2012 0710   CMP     Component Value Date/Time   NA 139 06/22/2014 1028   K 5.1 06/22/2014 1028   CL 103  06/22/2014 1028   CO2 26 06/22/2014 1028   GLUCOSE 89 06/22/2014 1028   BUN 11 06/22/2014 1028   CREATININE 0.85 06/22/2014 1028   CREATININE 1.00 09/18/2013 0951   CALCIUM 9.7 06/22/2014 1028   PROT 6.8 06/22/2014 1028   ALBUMIN 4.3 06/22/2014 1028   AST 42* 06/22/2014 1028   ALT 45 06/22/2014 1028   ALKPHOS 57 06/22/2014 1028   BILITOT 1.2 06/22/2014 1028       Component Value Date/Time   WBC 9.0 03/17/2014 0801   WBC 6.9 09/14/2008   HGB 17.2* 03/17/2014 0801   HGB 18.4* 09/18/2013 0951   HGB 15.6 09/14/2008   HCT 48.9 03/17/2014 0801   HCT 54.0* 09/18/2013 0951   HCT 47.4 09/14/2008   MCV 103.2* 03/17/2014 0801   MCV 102 09/14/2008    Lipid Panel     Component Value Date/Time   CHOL 219* 03/17/2014 0801   TRIG 221* 03/17/2014 0801   HDL 51 03/17/2014 0801   CHOLHDL 4.3 03/17/2014  0801   VLDL 44* 03/17/2014 0801   LDLCALC 124* 03/17/2014 0801    ABG    Component Value Date/Time   TCO2 24 09/18/2013 0951     Lab Results  Component Value Date   TSH 2.3 09/14/2008   BNP (last 3 results) No results for input(s): PROBNP in the last 8760 hours. Cardiac Panel (last 3 results) No results for input(s): CKTOTAL, CKMB, TROPONINI, RELINDX in the last 72 hours.  Iron/TIBC/Ferritin/ %Sat No results found for: IRON, TIBC, FERRITIN, IRONPCTSAT   EKG Orders placed or performed in visit on 12/23/14  . EKG 12-Lead     Prior Assessment and Plan Problem List as of 12/23/2014      Cardiovascular and Mediastinum   Hypertension   Last Assessment & Plan 02/09/2014 Office Visit Written 02/09/2014  1:45 PM by Lendon Colonel, NP    His BP is labile, but WNL. He is compliant with medications. Will not make any changes at this time. He will be seen in 6 months.       Superficial thrombosis of lower extremity   Last Assessment & Plan 07/14/2013 Office Visit Written 07/14/2013  4:46 PM by Lendon Colonel, NP    He is feeling some better in the left lower extremity but continues soreness in the upper left thigh. He is to be placed on ECASA 81 mg daily. He is to wear compression hose as directed. I will repeat venous  doppler U/S of left leg to ascertain for continued thrombus.        Digestive   GERD   Last Assessment & Plan 11/17/2012 Office Visit Written 11/19/2012  4:09 PM by Andria Meuse, NP    Refractory GERD despite daily Dexilant 60mg .  Last EGD showed severe reflux esophagitis.  EGD for further evaluation to look for complicated GERD, gastritis/esophagitis, PUD or malignancy.  I have discussed risks & benefits which include, but are not limited to, bleeding, infection, perforation & drug reaction.  The patient agrees with this plan & written consent will be obtained.    Phenergan 25mg  IV 30 min prior to procedures to augment sedation given hx etoh use daily Continue  dexilant 60mg  daily now & this may need to be changed after EGD      CONSTIPATION   DYSPHAGIA UNSPECIFIED     Genitourinary   ERECTILE DYSFUNCTION, ORGANIC     Other   Palpitations   Last Assessment & Plan 07/14/2013 Office Visit Written  07/14/2013  4:48 PM by Lendon Colonel, NP    He continues to have medical noncompliance with use of acebutelol. I have asked him to take the medication as directed so that he will not have recurrence of palpitations. He states that when he forgets to take the medication, he notices that his palpitations get worse. I have provided a Rx renewal for BB.      TOBACCO ABUSE   Last Assessment & Plan 01/12/2014 Office Visit Written 01/12/2014  1:51 PM by Lendon Colonel, NP    He is advised to decrease and eventually stop smoking. He is up to 1 1/2 - 2 ppd. Anxiety is also playing into this. He has Rx from Dr. Wolfgang Phoenix for Gabbs. He is asked to follow up with Dr. Wolfgang Phoenix for ongoing assessment.      ABDOMINAL PAIN, RIGHT LOWER QUADRANT   Family history of colonic polyps   Last Assessment & Plan 11/15/2011 Office Visit Edited 11/15/2011  2:12 PM by Mahala Menghini, PA    Recommend colonoscopy given family history of colon polyps in 2 sisters in their mid 67s. Patient is aware of recommendation, he will call as when he is ready to schedule procedure.        Insomnia   Last Assessment & Plan 01/18/2012 Office Visit Written 01/18/2012  4:36 PM by Lendon Colonel, NP    He works alternating day and night shifts and often has trouble sleeping during the day. I have given him Ambien 5 mg to take prn. He is advised to get black out curtains to assist with daytime light coming in during sleep. He verbalizes understanding.       Mixed hyperlipidemia   Last Assessment & Plan 02/15/2012 Office Visit Written 02/15/2012  3:52 PM by Lendon Colonel, NP    I reviewed his lipid study with him. He states he does not want to take the avorvastatin.  I showed him a copy of the results  with TC  228;TG 261 and LDL 129.  He is reluctant to take any medications or to change eating and alcohol habits.  I have reinforced the need to continue medication. The long term risks are reinforced to him.      Hematochezia   Last Assessment & Plan 11/17/2012 Office Visit Written 11/19/2012  4:11 PM by Andria Meuse, NP    Colonoscopy w/ Dr Gala Romney to determine source of dark red hematochezia.  Differentials include ischemic colitis, NSAID-induced enteropathy, colorectal carcinoma, polyp, or diverticular bleeding.  Less likely small bowel source.  I have discussed risks & benefits which include, but are not limited to, bleeding, infection, perforation & drug reaction.  The patient agrees with this plan & written consent will be obtained.     1-800-QUIT-NOW for help quitting smoking It would be best to avoid alcohol intake now due to your stomach discomfort Advised to avoid NSAIDS       Headache in back of head   Last Assessment & Plan 01/12/2014 Office Visit Written 01/12/2014  1:53 PM by Lendon Colonel, NP    Will have CT scan of the head completed for evaluation to given him more information concerning this.      EtOH dependence   Last Assessment & Plan 02/09/2014 Office Visit Written 02/09/2014  1:48 PM by Lendon Colonel, NP    He admits to drinking heavily, some binge drinking and also 1/2 gallon of whiskey daily with increased consumption. He is given information  on AA. I have asked him to talk to Dr. Wolfgang Phoenix about ongoing treatment regimen and more intensive treatment as IP if necessary.          Imaging: No results found.

## 2014-12-23 NOTE — Assessment & Plan Note (Signed)
He tells me that he has been on Xanax in the past.  He states that Dr. Wolfgang Phoenix provided this to him. I have called and spoken to [place] and let him know that I have given Rx for xanax 0.25 mg #5 only with no refills.  I also discussed with the patient that he will need to followup with Dr. Wolfgang Phoenix discuss need for refills for ongoing treatment of this.  I have also given him information on [facility] counseling services to discuss etiology of his anxiety. I have also informed Dr. Wolfgang Phoenix of this referral. Dr. Wolfgang Phoenix is in agreement with this plan and will follow up with him in the office.

## 2014-12-23 NOTE — Assessment & Plan Note (Signed)
This is subjective reporting.  He states his blood pressures greater than 200/100 at work at times.  He also finds that his heart rate goes up at work and he becomes very anxious.  I am not sure which comes first.  He is currently on beta blocker and ACE inhibitor.  In the office today as in the past of his visits.  His blood pressure has been normal.  Not make any changes at this time.

## 2014-12-24 ENCOUNTER — Telehealth: Payer: Self-pay | Admitting: *Deleted

## 2014-12-24 DIAGNOSIS — I10 Essential (primary) hypertension: Secondary | ICD-10-CM

## 2014-12-24 DIAGNOSIS — Z87898 Personal history of other specified conditions: Secondary | ICD-10-CM

## 2014-12-24 DIAGNOSIS — F1910 Other psychoactive substance abuse, uncomplicated: Secondary | ICD-10-CM

## 2014-12-24 DIAGNOSIS — R000 Tachycardia, unspecified: Secondary | ICD-10-CM

## 2014-12-24 NOTE — Telephone Encounter (Signed)
Attempted to call patient. Unable to reach patient by phone. Will try later.

## 2014-12-24 NOTE — Telephone Encounter (Signed)
-----   Message from Lendon Colonel, NP sent at 12/24/2014  7:31 AM EST ----- Regarding: Labs I would like to get some labs on this patient. Please order a TSH, CBC, CMET, Magnesium, and a urine drug screen. I told the patient I would be ordering labs.   Thank you.

## 2014-12-28 NOTE — Telephone Encounter (Signed)
Attempted to call patient to ensure that lab work is done. Unable to reach by phone.

## 2015-01-01 ENCOUNTER — Other Ambulatory Visit: Payer: Self-pay | Admitting: Gastroenterology

## 2015-01-03 ENCOUNTER — Telehealth: Payer: Self-pay

## 2015-01-03 NOTE — Telephone Encounter (Signed)
EOS report place on Dr.Branch's desk to read

## 2015-01-06 ENCOUNTER — Other Ambulatory Visit: Payer: Self-pay | Admitting: *Deleted

## 2015-01-06 DIAGNOSIS — R000 Tachycardia, unspecified: Secondary | ICD-10-CM

## 2015-01-12 LAB — CBC WITH DIFFERENTIAL/PLATELET
BASOS ABS: 0 10*3/uL (ref 0.0–0.1)
BASOS PCT: 1 % (ref 0–1)
EOS PCT: 5 % (ref 0–5)
Eosinophils Absolute: 0.2 10*3/uL (ref 0.0–0.7)
HEMATOCRIT: 45.4 % (ref 39.0–52.0)
Hemoglobin: 15.2 g/dL (ref 13.0–17.0)
LYMPHS PCT: 38 % (ref 12–46)
Lymphs Abs: 1.5 10*3/uL (ref 0.7–4.0)
MCH: 38.2 pg — ABNORMAL HIGH (ref 26.0–34.0)
MCHC: 33.5 g/dL (ref 30.0–36.0)
MCV: 114.1 fL — ABNORMAL HIGH (ref 78.0–100.0)
MPV: 9.8 fL (ref 8.6–12.4)
Monocytes Absolute: 0.6 10*3/uL (ref 0.1–1.0)
Monocytes Relative: 14 % — ABNORMAL HIGH (ref 3–12)
NEUTROS PCT: 42 % — AB (ref 43–77)
Neutro Abs: 1.7 10*3/uL (ref 1.7–7.7)
PLATELETS: 228 10*3/uL (ref 150–400)
RBC: 3.98 MIL/uL — AB (ref 4.22–5.81)
RDW: 15.1 % (ref 11.5–15.5)
WBC: 4 10*3/uL (ref 4.0–10.5)

## 2015-01-12 LAB — COMPREHENSIVE METABOLIC PANEL
ALBUMIN: 3.7 g/dL (ref 3.5–5.2)
ALT: 58 U/L — AB (ref 0–53)
AST: 54 U/L — ABNORMAL HIGH (ref 0–37)
Alkaline Phosphatase: 54 U/L (ref 39–117)
BUN: 5 mg/dL — ABNORMAL LOW (ref 6–23)
CALCIUM: 8.9 mg/dL (ref 8.4–10.5)
CO2: 22 meq/L (ref 19–32)
CREATININE: 0.75 mg/dL (ref 0.50–1.35)
Chloride: 104 mEq/L (ref 96–112)
Glucose, Bld: 92 mg/dL (ref 70–99)
Potassium: 4.5 mEq/L (ref 3.5–5.3)
Sodium: 140 mEq/L (ref 135–145)
TOTAL PROTEIN: 6.3 g/dL (ref 6.0–8.3)
Total Bilirubin: 0.4 mg/dL (ref 0.2–1.2)

## 2015-01-12 LAB — TSH: TSH: 1.709 u[IU]/mL (ref 0.350–4.500)

## 2015-01-12 LAB — MAGNESIUM: MAGNESIUM: 1.9 mg/dL (ref 1.5–2.5)

## 2015-01-23 ENCOUNTER — Other Ambulatory Visit: Payer: Self-pay | Admitting: Family Medicine

## 2015-01-24 ENCOUNTER — Other Ambulatory Visit: Payer: Self-pay | Admitting: Cardiovascular Disease

## 2015-01-24 NOTE — Telephone Encounter (Signed)
rx called into pharmacy for Enalapril 5 mg daily #30 RF3 in [place]

## 2015-01-24 NOTE — Telephone Encounter (Signed)
Needs Enalapirl sent to [facility] @ [facility] [phone] / tgs

## 2015-01-25 NOTE — Telephone Encounter (Signed)
Ok times 15 more d, needs f u

## 2015-01-28 ENCOUNTER — Ambulatory Visit (INDEPENDENT_AMBULATORY_CARE_PROVIDER_SITE_OTHER): Payer: BLUE CROSS/BLUE SHIELD | Admitting: Adult Health

## 2015-01-28 ENCOUNTER — Encounter: Payer: Self-pay | Admitting: Adult Health

## 2015-01-28 VITALS — BP 156/88 | HR 88 | Ht 73.0 in | Wt 250.0 lb

## 2015-01-28 DIAGNOSIS — I10 Essential (primary) hypertension: Secondary | ICD-10-CM

## 2015-01-28 DIAGNOSIS — R002 Palpitations: Secondary | ICD-10-CM

## 2015-01-28 MED ORDER — ENALAPRIL MALEATE 5 MG PO TABS: 5.0000 mg | ORAL_TABLET | Freq: Every day | ORAL | Status: DC

## 2015-01-28 MED ORDER — ACEBUTOLOL HCL 400 MG PO CAPS: 400.0000 mg | ORAL_CAPSULE | Freq: Two times a day (BID) | ORAL | Status: DC

## 2015-01-28 NOTE — Progress Notes (Signed)
Cardiology Office Note   Date:  01/28/2015    ID:  Bradley, Miles [birth year], MRN [id number]  PCP:  Rubbie Battiest, MD  Cardiologist: Harl Bowie (est.)  Jory Sims, NP   Chief Complaint  Patient presents with  . Hypertension      History of Present Illness: Bradley Miles is a 49 y.o. male who presents for ongoing assessment and management of hypertension, with known history of anxiety and alcohol abuse.  He was last seen in the office on 12/23/2014, with continued elevation of blood pressure, and palpitations.  The patient has been advised on numerous occasions to seek help with alcohol abuse and anxiety, and he has refused this.  On last office visit a seven-day.  Cardiac monitor was placed in order to evaluate his heart rate and rhythm.  He was continued on his current medications.a seven-day monitor revealed normal sinus rhythm, PVCs, and one episode of SVT.  He is here for discussion of test results.  Is just returned [place] as well.  He did of 1 episode of SVT, which was documented on the cardiac monitor.  The patient otherwise has been feeling fine.  He said no recurrence of palpitations.  He is use Xanax on occasion.  He has been advised to follow up with Dr. Wolfgang Phoenix,  He says that he can give him refills and monitor his progress.   Past Medical History  Diagnosis Date  . Hypertension   . GERD (gastroesophageal reflux disease)   . Anxiety   . Palpitations   . Shortness of breath   . PVC's (premature ventricular contractions)     Past Surgical History  Procedure Laterality Date  . Esophagogastroduodenoscopy  05/19/09    RMR: Geographic distal esophageal erosions consistent with severe erosive reflux esophagitis. schatzki's ring s/P dilation/small hiatal hernia otherwise normal stomach  . Cyst reomved      from spine  . Wisdom tooth extraction    . Colonoscopy with esophagogastroduodenoscopy (egd)  12/16/2012    Procedure: COLONOSCOPY WITH ESOPHAGOGASTRODUODENOSCOPY  (EGD);  Surgeon: Daneil Dolin, MD;  Location: [facility];  Service: Endoscopy;  Laterality: N/A;  7:30     Current Outpatient Prescriptions  Medication Sig Dispense Refill  . acebutolol (SECTRAL) 400 MG capsule take 1 capsule by mouth twice a day 60 capsule 6  . allopurinol (ZYLOPRIM) 100 MG tablet take 1 tablet by mouth once daily 30 tablet 3  . colchicine 0.6 MG tablet Take 1 tablet (0.6 mg total) by mouth daily. 30 tablet 0  . DEXILANT 60 MG capsule take 1 capsule by mouth once daily 30 capsule 11  . enalapril (VASOTEC) 5 MG tablet take 1 tablet by mouth once daily 15 tablet 0  . VIAGRA 100 MG tablet take 1/2 tablet by mouth once daily as directed 3 tablet 0   No current facility-administered medications for this visit.    Allergies:   Review of patient's allergies indicates no known allergies.    Social History:  The patient  reports that he has been smoking Cigarettes.  He started smoking about 35 years ago. He has a 45 pack-year smoking history. He has never used smokeless tobacco. He reports that he drinks alcohol. He reports that he does not use illicit drugs.   Family History:  The patient's family history includes Cancer in his mother; Colon polyps (age of onset: 67) in his sister; Heart disease in his mother. There is no history of Liver disease.  ROS:  Please see the history of present illness.   Otherwise, .   All other systems are reviewed and negative.    PHYSICAL EXAM: VS:  BP 156/88 mmHg  Pulse 88  Ht 6\' 1"  (1.854 m)  Wt 250 lb (113.399 kg)  BMI 32.99 kg/m2  SpO2 98% , BMI Body mass index is 32.99 kg/(m^2). GEN: Well nourished, well developed, in no acute distress HEENT: normal Neck: no JVD, carotid bruits, or masses Cardiac: RRR; no murmurs, rubs, or gallops,no edema  Respiratory:  clear to auscultation bilaterally, normal work of breathing GI: soft, nontender, nondistended, + BS MS: no deformity or atrophy Skin: warm and dry, no rash Neuro:   Strength and sensation are intact Psych: euthymic mood, full affect  Recent Labs: 01/11/2015: ALT 58*; BUN 5*; Creatinine 0.75; Hemoglobin 15.2; Magnesium 1.9; Platelets 228; Potassium 4.5; Sodium 140; TSH 1.709    Lipid Panel    Component Value Date/Time   CHOL 219* 03/17/2014 0801   TRIG 221* 03/17/2014 0801   HDL 51 03/17/2014 0801   CHOLHDL 4.3 03/17/2014 0801   VLDL 44* 03/17/2014 0801   LDLCALC 124* 03/17/2014 0801      Wt Readings from Last 3 Encounters:  01/28/15 250 lb (113.399 kg)  12/23/14 250 lb (113.399 kg)  06/22/14 257 lb (116.574 kg)      ASSESSMENT AND PLAN:  1.  The patient's was one episode of SVT.  On make any changes in his medication regimen.  He will continue the Sectral, and enalapril..he is advised if he has a sustained episode, lasting greater than 30 minutes he should call EMS, and brought to the hospital.  At this time.  I will not place him on any new medications.  He seems to believe that it is sometimes related to anxiety, but not always.  He appears very calm, and states that [place] without any stress.we will see him again in 6 months unless he becomes symptomatic, or has to come to the emergency room for sustained SVT.may change his Sectral to metoprolol if necessary.  2. Hypertension: Mildly elevated today.  He was in a hurry to get here.  We will not make any changes in his medications at this time.  3. Anxiety with panic attacks: He is advised to followup with his primary care physician for refills on Xanax.  He states it was felt to him when he took it.  He is advised that his primary care physician.  Would like to see him and discuss the need to continue current dose versus increasing the dose. Current medicines are reviewed at length with the patient today.      No orders of the defined types were placed in this encounter.     Disposition:   FU with 6 months. Signed, Jory Sims, NP    01/28/2015 3:38 PM    [facility] [address], [place], [place]  [address] Phone: [phone]; Fax: [phone]

## 2015-01-28 NOTE — Progress Notes (Deleted)
Name: Bradley Miles    DOB: [birth year]  Age: 49 y.o.  MR#: [id number]       PCP:  Rubbie Battiest, MD      Insurance: Payor: [place] / Plan: BCBS OTHER / Product Type: *No Product type* /   CC:    Chief Complaint  Patient presents with  . Hypertension    VS Filed Vitals:   01/28/15 1530  BP: 156/88  Pulse: 88  Height: 6\' 1"  (1.854 m)  Weight: 250 lb (113.399 kg)  SpO2: 98%    Weights Current Weight  01/28/15 250 lb (113.399 kg)  12/23/14 250 lb (113.399 kg)  06/22/14 257 lb (116.574 kg)    Blood Pressure  BP Readings from Last 3 Encounters:  01/28/15 156/88  12/23/14 122/78  06/22/14 138/94     Admit date:  (Not on file) Last encounter with RMR:  12/23/2014   Allergy Review of patient's allergies indicates no known allergies.  Current Outpatient Prescriptions  Medication Sig Dispense Refill  . acebutolol (SECTRAL) 400 MG capsule take 1 capsule by mouth twice a day 60 capsule 6  . allopurinol (ZYLOPRIM) 100 MG tablet take 1 tablet by mouth once daily 30 tablet 3  . colchicine 0.6 MG tablet Take 1 tablet (0.6 mg total) by mouth daily. 30 tablet 0  . DEXILANT 60 MG capsule take 1 capsule by mouth once daily 30 capsule 11  . enalapril (VASOTEC) 5 MG tablet take 1 tablet by mouth once daily 15 tablet 0  . VIAGRA 100 MG tablet take 1/2 tablet by mouth once daily as directed 3 tablet 0   No current facility-administered medications for this visit.    Discontinued Meds:   There are no discontinued medications.  Patient Active Problem List   Diagnosis Date Noted  . Anxiety 12/23/2014  . EtOH dependence 02/09/2014  . Headache in back of head 01/12/2014  . Superficial thrombosis of lower extremity 07/01/2013  . Hematochezia 11/17/2012  . Mixed hyperlipidemia 02/15/2012  . Hypertension 01/18/2012  . Insomnia 01/18/2012  . Family history of colonic polyps 11/15/2011  . Palpitations 10/04/2011  . TOBACCO ABUSE 05/18/2010  . GERD 05/18/2010  .  CONSTIPATION 05/18/2010  . 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 DYSPHAGIA UNSPECIFIED 05/18/2010  . ABDOMINAL PAIN, RIGHT LOWER QUADRANT 05/18/2010    LABS    Component Value Date/Time   NA 140 01/11/2015 0743   NA 139 06/22/2014 1028   NA 136 03/17/2014 0801   K 4.5 01/11/2015 0743   K 5.1 06/22/2014 1028   K 5.3 03/17/2014 0801   CL 104 01/11/2015 0743   CL 103 06/22/2014 1028   CL 102 03/17/2014 0801   CO2 22 01/11/2015 0743   CO2 26 06/22/2014 1028   CO2 28 03/17/2014 0801   GLUCOSE 92 01/11/2015 0743   GLUCOSE 89 06/22/2014 1028   GLUCOSE 88 03/17/2014 0801   BUN 5* 01/11/2015 0743   BUN 11 06/22/2014 1028   BUN 12 03/17/2014 0801   CREATININE 0.75 01/11/2015 0743   CREATININE 0.85 06/22/2014 1028   CREATININE 0.96 03/17/2014 0801   CREATININE 1.00 09/18/2013 0951   CREATININE 0.9 09/14/2008   CALCIUM 8.9 01/11/2015 0743   CALCIUM 9.7 06/22/2014 1028   CALCIUM 9.4 03/17/2014 0801   CMP     Component Value Date/Time   NA 140 01/11/2015 0743   K 4.5 01/11/2015 0743   CL 104 01/11/2015 0743   CO2 22 01/11/2015 0743   GLUCOSE  92 01/11/2015 0743   BUN 5* 01/11/2015 0743   CREATININE 0.75 01/11/2015 0743   CREATININE 1.00 09/18/2013 0951   CALCIUM 8.9 01/11/2015 0743   PROT 6.3 01/11/2015 0743   ALBUMIN 3.7 01/11/2015 0743   AST 54* 01/11/2015 0743   ALT 58* 01/11/2015 0743   ALKPHOS 54 01/11/2015 0743   BILITOT 0.4 01/11/2015 0743       Component Value Date/Time   WBC 4.0 01/11/2015 0743   WBC 9.0 03/17/2014 0801   WBC 6.9 09/14/2008   HGB 15.2 01/11/2015 0743   HGB 17.2* 03/17/2014 0801   HGB 18.4* 09/18/2013 0951   HCT 45.4 01/11/2015 0743   HCT 48.9 03/17/2014 0801   HCT 54.0* 09/18/2013 0951   MCV 114.1* 01/11/2015 0743   MCV 103.2* 03/17/2014 0801   MCV 102 09/14/2008    Lipid Panel     Component Value Date/Time   CHOL 219* 03/17/2014 0801   TRIG 221* 03/17/2014 0801   HDL 51 03/17/2014 0801   CHOLHDL 4.3 03/17/2014 0801   VLDL  44* 03/17/2014 0801   LDLCALC 124* 03/17/2014 0801    ABG    Component Value Date/Time   TCO2 24 09/18/2013 0951     Lab Results  Component Value Date   TSH 1.709 01/11/2015   BNP (last 3 results) No results for input(s): BNP in the last 8760 hours.  ProBNP (last 3 results) No results for input(s): PROBNP in the last 8760 hours.  Cardiac Panel (last 3 results) No results for input(s): CKTOTAL, CKMB, TROPONINI, RELINDX in the last 72 hours.  Iron/TIBC/Ferritin/ %Sat No results found for: IRON, TIBC, FERRITIN, IRONPCTSAT   EKG Orders placed or performed in visit on 01/06/15  . Cardiac event monitor     Prior Assessment and Plan Problem List as of 01/28/2015      Cardiovascular and Mediastinum   Hypertension   Last Assessment & Plan 12/23/2014 Office Visit Written 12/23/2014  4:59 PM by Lendon Colonel, NP    This is subjective reporting.  He states his blood pressures greater than 200/100 at work at times.  He also finds that his heart rate goes up at work and he becomes very anxious.  I am not sure which comes first.  He is currently on beta blocker and ACE inhibitor.  In the office today as in the past of his visits.  His blood pressure has been normal.  Not make any changes at this time.      Superficial thrombosis of lower extremity   Last Assessment & Plan 07/14/2013 Office Visit Written 07/14/2013  4:46 PM by Lendon Colonel, NP    He is feeling some better in the left lower extremity but continues soreness in the upper left thigh. He is to be placed on ECASA 81 mg daily. He is to wear compression hose as directed. I will repeat venous  doppler U/S of left leg to ascertain for continued thrombus.        Digestive   GERD   Last Assessment & Plan 11/17/2012 Office Visit Written 11/19/2012  4:09 PM by Andria Meuse, NP    Refractory GERD despite daily Dexilant 60mg .  Last EGD showed severe reflux esophagitis.  EGD for further evaluation to look for complicated GERD,  gastritis/esophagitis, PUD or malignancy.  I have discussed risks & benefits which include, but are not limited to, bleeding, infection, perforation & drug reaction.  The patient agrees with this plan & written consent will be  obtained.    Phenergan 25mg  IV 30 min prior to procedures to augment sedation given hx etoh use daily Continue dexilant 60mg  daily now & this may need to be changed after EGD      CONSTIPATION   DYSPHAGIA UNSPECIFIED     Genitourinary   ERECTILE DYSFUNCTION, ORGANIC     Other   Palpitations   Last Assessment & Plan 12/23/2014 Office Visit Written 12/23/2014  4:58 PM by Lendon Colonel, NP    I will place a seven-day cardiac monitor on this patient to evaluate heart rate and rhythm.  He is currently on acebutolol 400 mg twice a day.  May need to change to another beta blocker, or add a calcium channel blocker.  I am not certain if his anxiety is contributing to his rapid heart rhythm.  We did check a TSH in the past and was found to be normal.      TOBACCO ABUSE   Last Assessment & Plan 01/12/2014 Office Visit Written 01/12/2014  1:51 PM by Lendon Colonel, NP    He is advised to decrease and eventually stop smoking. He is up to 1 1/2 - 2 ppd. Anxiety is also playing into this. He has Rx from Dr. Wolfgang Phoenix for [place]. He is asked to follow up with Dr. Wolfgang Phoenix for ongoing assessment.      ABDOMINAL PAIN, RIGHT LOWER QUADRANT   Family history of colonic polyps   Last Assessment & Plan 11/15/2011 Office Visit Edited 11/15/2011  2:12 PM by Mahala Menghini, PA    Recommend colonoscopy given family history of colon polyps in 2 sisters in their mid 69s. Patient is aware of recommendation, he will call as when he is ready to schedule procedure.        Insomnia   Last Assessment & Plan 01/18/2012 Office Visit Written 01/18/2012  4:36 PM by Lendon Colonel, NP    He works alternating day and night shifts and often has trouble sleeping during the day. I have given him Ambien 5 mg to  take prn. He is advised to get black out curtains to assist with daytime light coming in during sleep. He verbalizes understanding.       Mixed hyperlipidemia   Last Assessment & Plan 02/15/2012 Office Visit Written 02/15/2012  3:52 PM by Lendon Colonel, NP    I reviewed his lipid study with him. He states he does not want to take the avorvastatin.  I showed him a copy of the results with TC  228;TG 261 and LDL 129.  He is reluctant to take any medications or to change eating and alcohol habits.  I have reinforced the need to continue medication. The long term risks are reinforced to him.      Hematochezia   Last Assessment & Plan 11/17/2012 Office Visit Written 11/19/2012  4:11 PM by Andria Meuse, NP    Colonoscopy w/ Dr Gala Romney to determine source of dark red hematochezia.  Differentials include ischemic colitis, NSAID-induced enteropathy, colorectal carcinoma, polyp, or diverticular bleeding.  Less likely small bowel source.  I have discussed risks & benefits which include, but are not limited to, bleeding, infection, perforation & drug reaction.  The patient agrees with this plan & written consent will be obtained.     1-800-QUIT-NOW for help quitting smoking It would be best to avoid alcohol intake now due to your stomach discomfort Advised to avoid NSAIDS       Headache in back of head  Last Assessment & Plan 01/12/2014 Office Visit Written 01/12/2014  1:53 PM by Lendon Colonel, NP    Will have CT scan of the head completed for evaluation to given him more information concerning this.      EtOH dependence   Last Assessment & Plan 12/23/2014 Office Visit Written 12/23/2014  5:03 PM by Lendon Colonel, NP    He is given  information on alcoholic's anonymous. I have advised him to seek treatment for his EtOH abuse.  He may need to talk with his primary care about admission for same.  If this is something he is unable to control on his own.      Anxiety   Last Assessment & Plan  12/23/2014 Office Visit Written 12/23/2014  5:02 PM by Lendon Colonel, NP    He tells me that he has been on Xanax in the past.  He states that Dr. Wolfgang Phoenix provided this to him. I have called and spoken to [place] and let him know that I have given Rx for xanax 0.25 mg #5 only with no refills.  I also discussed with the patient that he will need to followup with Dr. Wolfgang Phoenix discuss need for refills for ongoing treatment of this.  I have also given him information on [facility] counseling services to discuss etiology of his anxiety. I have also informed Dr. Wolfgang Phoenix of this referral. Dr. Wolfgang Phoenix is in agreement with this plan and will follow up with him in the office.           Imaging: No results found.

## 2015-01-28 NOTE — Patient Instructions (Addendum)
Your physician wants you to follow-up in: 6 months with Jory Sims NP You will receive a reminder letter in the mail two months in advance. If you don't receive a letter, please call our office to schedule the follow-up appointment.      Your physician recommends that you continue on your current medications as directed. Please refer to the Current Medication list given to you today.      I refilled your medications      Thank you for choosing [facility] !

## 2015-03-24 ENCOUNTER — Other Ambulatory Visit: Payer: Self-pay | Admitting: Family Medicine

## 2015-04-27 ENCOUNTER — Other Ambulatory Visit: Payer: Self-pay | Admitting: Family Medicine

## 2015-05-26 ENCOUNTER — Other Ambulatory Visit: Payer: Self-pay | Admitting: Family Medicine

## 2015-07-07 ENCOUNTER — Other Ambulatory Visit: Payer: Self-pay | Admitting: Family Medicine

## 2015-07-15 ENCOUNTER — Ambulatory Visit (HOSPITAL_COMMUNITY)
Admission: RE | Admit: 2015-07-15 | Discharge: 2015-07-15 | Disposition: A | Discharge status: Home / Self Care | Payer: BLUE CROSS/BLUE SHIELD | Source: Ambulatory Visit | Attending: Family Medicine | Admitting: Family Medicine

## 2015-07-15 ENCOUNTER — Telehealth: Payer: Self-pay

## 2015-07-15 ENCOUNTER — Ambulatory Visit (INDEPENDENT_AMBULATORY_CARE_PROVIDER_SITE_OTHER): Payer: BLUE CROSS/BLUE SHIELD | Admitting: Family Medicine

## 2015-07-15 ENCOUNTER — Encounter: Payer: Self-pay | Admitting: Family Medicine

## 2015-07-15 VITALS — BP 134/86 | Temp 98.4°F | Ht 73.0 in | Wt 247.4 lb

## 2015-07-15 DIAGNOSIS — F10288 Alcohol dependence with other alcohol-induced disorder: Secondary | ICD-10-CM

## 2015-07-15 DIAGNOSIS — I82431 Acute embolism and thrombosis of right popliteal vein: Secondary | ICD-10-CM | POA: Diagnosis not present

## 2015-07-15 DIAGNOSIS — I82401 Acute embolism and thrombosis of unspecified deep veins of right lower extremity: Secondary | ICD-10-CM | POA: Diagnosis not present

## 2015-07-15 DIAGNOSIS — M7989 Other specified soft tissue disorders: Secondary | ICD-10-CM | POA: Diagnosis not present

## 2015-07-15 DIAGNOSIS — J310 Chronic rhinitis: Secondary | ICD-10-CM

## 2015-07-15 DIAGNOSIS — J329 Chronic sinusitis, unspecified: Secondary | ICD-10-CM | POA: Diagnosis not present

## 2015-07-15 DIAGNOSIS — G629 Polyneuropathy, unspecified: Secondary | ICD-10-CM | POA: Diagnosis not present

## 2015-07-15 DIAGNOSIS — Z8672 Personal history of thrombophlebitis: Secondary | ICD-10-CM | POA: Diagnosis not present

## 2015-07-15 DIAGNOSIS — R5383 Other fatigue: Secondary | ICD-10-CM | POA: Diagnosis not present

## 2015-07-15 MED ORDER — ENOXAPARIN SODIUM 100 MG/ML ~~LOC~~ SOLN: SUBCUTANEOUS | Status: DC

## 2015-07-15 MED ORDER — WARFARIN SODIUM 5 MG PO TABS: 5.0000 mg | ORAL_TABLET | Freq: Every day | ORAL | Status: DC

## 2015-07-15 MED ORDER — AMOXICILLIN-POT CLAVULANATE 875-125 MG PO TABS: 1.0000 | ORAL_TABLET | Freq: Two times a day (BID) | ORAL | Status: AC

## 2015-07-15 NOTE — Progress Notes (Signed)
   Subjective:    Patient ID: Bradley Miles, male    DOB: 10/12/1966, 49 y.o.   MRN: [id number]  Toe Pain  The incident occurred more than 1 week ago. The incident occurred at home. There was no injury mechanism. The pain is present in the right toes and left toes. Quality: numbness, tingling. The pain is mild. The pain has been intermittent since onset. Associated symptoms include numbness and tingling. He reports no foreign bodies present. The symptoms are aggravated by movement. Treatments tried: aspirin. The treatment provided no relief.   Hands oversens to tough and heat and cold  Now experiencing numb sens in the feet   Patient admits to alcohol abuse. Now drinking approximately one fifth of whiskey every single day. Feet have become progressively none.  Experience swelling in the right leg. This was not the reason for his presentation. Notes last several weeks although somewhat better now. No chest pain no shortness of breath. Patient unfortunately smokes over a pack per day.  The spelled this much whiskey for 3 years but not willing to admit that he really has a problem with alcohol.  Continues to work full-time. Some family stress Feels numb   Overly sensitive to touch  Some frostbite type sens in the feet  Pt had more swelling in right ankle and legs Patient states that he has sinus congestion, cough and drainage. This has been present for 3-4 days now.  Nose getting gunky and yellow and cong  Coughing a fair amnt with prod cough   Review of Systems  Neurological: Positive for tingling and numbness.   See above no weight loss no weight gain no blood in stools was advised the spring to follow-up with [place] in primary care via the cardiologist patient never did this    Objective:   Physical Exam alert vitals stable. HEENT some frontal congestion and discharge otherwise normal. Heart rare rhythm neck supple. Lungs clear. Heart regular rhythm abdomen no distinct tenderness  right leg larger than left. Distal feet sensation diminished bilateral. Pulses good venous stasis changes noted. Hands sensation currently intact.      Assessment & Plan:  Impression 1 rhinosinusitis discussed over to alcohol abuse discussed patient declines referral for counseling #3 sensory neuropathy. Could be related to patient's alcohol abuse discussed #4 right leg enlargement addendum ultrasound came back positive for DVT plan initiate Lovenox. Initiate Coumadin. Appropriate blood work. Neurology referral. Encouraged to stop smoking. Start vitamin with folic acid and B complex daily. Follow-up as scheduled. WSL

## 2015-07-15 NOTE — Patient Instructions (Signed)
Centrum vitamin with E82 and Folic acid take every day, we may add to this when blood work returns

## 2015-07-15 NOTE — Telephone Encounter (Signed)
Per Dr. Richardson Landry- US showed DVT in right leg, prescribe Lovenox 100 mg/ml  Inject 1 every 12 hours starting tonight. Take Coumadin 5 mg daily starting tomorrow night. Recheck on Monday with Dr. Nicki Reaper and check INR in the office on Monday. Medication sent to pharmacy. Patient verbalized understanding and was transferred to front desk to schedule appointment.

## 2015-07-16 DIAGNOSIS — G629 Polyneuropathy, unspecified: Secondary | ICD-10-CM | POA: Insufficient documentation

## 2015-07-16 LAB — FOLATE: Folate: 4.8 ng/mL (ref >3.0)

## 2015-07-16 LAB — HEPATIC FUNCTION PANEL
ALT: 47 IU/L — ABNORMAL HIGH (ref 0–44)
AST: 44 IU/L — ABNORMAL HIGH (ref 0–40)
Albumin: 4.3 g/dL (ref 3.5–5.5)
Alkaline Phosphatase: 64 IU/L (ref 39–117)
BILIRUBIN TOTAL: 0.7 mg/dL (ref 0.0–1.2)
Bilirubin, Direct: 0.19 mg/dL (ref 0.00–0.40)
Total Protein: 7 g/dL (ref 6.0–8.5)

## 2015-07-16 LAB — VITAMIN B12: VITAMIN B 12: 325 pg/mL (ref 211–946)

## 2015-07-16 LAB — TSH: TSH: 2.01 u[IU]/mL (ref 0.450–4.500)

## 2015-07-18 ENCOUNTER — Encounter: Payer: Self-pay | Admitting: Family Medicine

## 2015-07-18 ENCOUNTER — Ambulatory Visit (INDEPENDENT_AMBULATORY_CARE_PROVIDER_SITE_OTHER): Payer: BLUE CROSS/BLUE SHIELD | Admitting: Family Medicine

## 2015-07-18 ENCOUNTER — Other Ambulatory Visit: Payer: Self-pay | Admitting: *Deleted

## 2015-07-18 VITALS — Wt 247.0 lb

## 2015-07-18 DIAGNOSIS — Z7901 Long term (current) use of anticoagulants: Secondary | ICD-10-CM | POA: Diagnosis not present

## 2015-07-18 DIAGNOSIS — R740 Nonspecific elevation of levels of transaminase and lactic acid dehydrogenase [LDH]: Secondary | ICD-10-CM

## 2015-07-18 DIAGNOSIS — I82401 Acute embolism and thrombosis of unspecified deep veins of right lower extremity: Secondary | ICD-10-CM | POA: Insufficient documentation

## 2015-07-18 DIAGNOSIS — R7401 Elevation of levels of liver transaminase levels: Secondary | ICD-10-CM

## 2015-07-18 DIAGNOSIS — G629 Polyneuropathy, unspecified: Secondary | ICD-10-CM

## 2015-07-18 LAB — POCT INR: INR: 1.5

## 2015-07-18 MED ORDER — RIVAROXABAN 20 MG PO TABS: 20.0000 mg | ORAL_TABLET | Freq: Every day | ORAL | Status: DC

## 2015-07-18 MED ORDER — RIVAROXABAN 15 MG PO TABS: ORAL_TABLET | ORAL | Status: DC

## 2015-07-18 NOTE — Progress Notes (Signed)
   Subjective:    Patient ID: Bradley Miles, male    DOB: Jan 02, 1966, 49 y.o.   MRN: [id number]  HPI Patient arrives for a follow up on DVT in right leg. Patient started lovenox 100 Bid Friday pm and Coumadin 5 mg once qpm staring Sat. This particular case was discussed with me by Dr. Richardson Landry. I went over the lab work with the patient. Elevated transaminase he was encouraged to abstain from alcohol. Also could be fatty liver. Result ultrasound reviewed with patient. INR 1.5. Patient denies any history of DVTs denies family history of DVTs but he did state that his mom was on blood thinners because of some sort of problems with circulation in the legs  Review of Systems See above. No night sweats no fevers or chills or weight loss    Objective:   Physical Exam Lungs clear hearts regular legs some swelling in the right calf noted but not severe       Assessment & Plan:  Long discussion held with patient regarding DVT and treatment options. We discussed adjusting Coumadin in continuing Lovenox versus going with Xarelto. Patient was told that either medications can cause bleeding issues that if he had bleeding issues immediately go to the ER.  Patient was also told that not treating DVT could increase her risk of pulmonary embolus and death  Patient was counseled to quit all alcohol The patient will stop Coumadin and Lovenox and start Xarelto Patient chose to go on Xarelto 50 mg twice a day for 3 weeks then 20 mg once daily hollow up with Dr. Richardson Landry in one month's time patient more than likely will need hypercoagulability profile in 4-6 months when stopping the medicine area no obvious trigger for this illness but no obvious signs of cancer going on recheck in one month

## 2015-07-18 NOTE — Addendum Note (Signed)
Addended by: Margaretmary Eddy on: 07/18/2015 09:43 AM   Modules accepted: Orders

## 2015-08-09 ENCOUNTER — Ambulatory Visit (INDEPENDENT_AMBULATORY_CARE_PROVIDER_SITE_OTHER): Payer: BLUE CROSS/BLUE SHIELD | Admitting: Neurology

## 2015-08-09 DIAGNOSIS — G629 Polyneuropathy, unspecified: Secondary | ICD-10-CM

## 2015-08-09 DIAGNOSIS — G5602 Carpal tunnel syndrome, left upper limb: Secondary | ICD-10-CM

## 2015-08-09 NOTE — Procedures (Signed)
[facility]  [address]  [place], [place] [address] Tel: [phone] Fax:  [phone] Test Date:  08/09/2015  Patient: Bradley Miles DOB: 1966-06-04 Physician: Narda Amber, DO  Sex: Male Height: '6\' 1"'$  Ref Phys: S. Wolfgang Phoenix, M.D.  ID#: [id number] Temp: 36.0C Technician: M. Dean   Patient Complaints: This is a 49 year old gentleman presenting for evaluation of bilateral hand and feet paresthesias.   NCV & EMG Findings: Extensive electrodiagnostic testing of the left upper and lower extremities shows:  1. Left median sensory response shows prolonged distal peak latency (3.9 ms) and reduced amplitude (13.7 V).  Left ulnar and radial sensory responses are within normal limits.  2. Left median and ulnar motor responses are within normal limits.  3. Left sural and superficial peroneal sensory responses are within normal limits.  4. Left peroneal and tibial motor responses are within normal limits.  5. There is no evidence of active or chronic motor axon loss changes affecting any of the tested muscles. Motor unit configuration and recruitment pattern is within normal limits.   Impression: 1. Left median neuropathy at or distal to the wrist, consistent with the clinical diagnosis of carpal tunnel syndrome.  Overall, these findings are moderate in degree electrically. 2. There is no evidence of a generalized sensorimotor polyneuropathy or cervical/lumbosacral radiculopathy affecting the left side.  Small fiber neuropathy cannot be excluded by this study.   ___________________________ Narda Amber, DO    Nerve Conduction Studies Anti Sensory Summary Table   Site NR Peak (ms) Norm Peak (ms) P-T Amp (V) Norm P-T Amp  Left Median Anti Sensory (2nd Digit)  36C  Wrist    3.9 <3.4 13.7 >20  Left Radial Anti Sensory (Base 1st Digit)  36C  Wrist    2.2 <2.7 18.1 >18  Left Sup Peroneal Anti Sensory (Ant Lat Mall)  36C  12 cm    3.2 <4.5 12.8 >5  Left Sural  Anti Sensory (Lat Mall)  36C  Calf    2.9 <4.5 9.9 >5  Left Ulnar Anti Sensory (5th Digit)  36C  Wrist    2.9 <3.1 17.2 >12   Motor Summary Table   Site NR Onset (ms) Norm Onset (ms) O-P Amp (mV) Norm O-P Amp Site1 Site2 Delta-0 (ms) Dist (cm) Vel (m/s) Norm Vel (m/s)  Left Median Motor (Abd Poll Brev)  36C  Wrist    3.4 <3.9 11.2 >6 Elbow Wrist 5.0 26.0 52 >50  Elbow    8.4  10.3         Left Peroneal Motor (Ext Dig Brev)  36C  Ankle    3.6 <5.5 4.4 >3 B Fib Ankle 8.7 37.0 43 >40  B Fib    12.3  3.5  Poplt B Fib 2.2 10.0 45 >40  Poplt    14.5  3.1         Left Tibial Motor (Abd Hall Brev)  36C  Ankle    3.4 <6.0 11.1 >8 Knee Ankle 10.7 45.0 42 >40  Knee    14.1  6.9         Left Ulnar Motor (Abd Dig Minimi)  36C  Wrist    2.5 <3.1 7.5 >7 B Elbow Wrist 4.5 24.0 53 >50  B Elbow    7.0  7.3  A Elbow B Elbow 1.8 10.0 56 >50  A Elbow    8.8  6.6          H Reflex Studies  NR H-Lat (ms) Lat Norm (ms) L-R H-Lat (ms)  Left Tibial (Gastroc)  36C     34.15 <35    EMG   Side Muscle Ins Act Fibs Psw Fasc Number Recrt Dur Dur. Amp Amp. Poly Poly. Comment  Left AntTibialis Nml Nml Nml Nml Nml Nml Nml Nml Nml Nml Nml Nml N/A  Left 1stDorInt Nml Nml Nml Nml Nml Nml Nml Nml Nml Nml Nml Nml N/A  Left Abd Poll Brev Nml Nml Nml Nml Nml Nml Nml Nml Nml Nml Nml Nml N/A  Left Ext Indicis Nml Nml Nml Nml Nml Nml Nml Nml Nml Nml Nml Nml N/A  Left PronatorTeres Nml Nml Nml Nml Nml Nml Nml Nml Nml Nml Nml Nml N/A  Left Biceps Nml Nml Nml Nml Nml Nml Nml Nml Nml Nml Nml Nml N/A  Left Triceps Nml Nml Nml Nml Nml Nml Nml Nml Nml Nml Nml Nml N/A  Left Deltoid Nml Nml Nml Nml Nml Nml Nml Nml Nml Nml Nml Nml N/A  Left Gastroc Nml Nml Nml Nml Nml Nml Nml Nml Nml Nml Nml Nml N/A  Left Flex Dig Long Nml Nml Nml Nml Nml Nml Nml Nml Nml Nml Nml Nml N/A  Left RectFemoris Nml Nml Nml Nml Nml Nml Nml Nml Nml Nml Nml Nml N/A  Left BicepsFemS Nml Nml Nml Nml Nml Nml Nml Nml Nml Nml Nml Nml N/A       Waveforms:

## 2015-08-17 ENCOUNTER — Encounter: Payer: Self-pay | Admitting: Family Medicine

## 2015-08-17 ENCOUNTER — Ambulatory Visit (INDEPENDENT_AMBULATORY_CARE_PROVIDER_SITE_OTHER): Payer: BLUE CROSS/BLUE SHIELD | Admitting: Family Medicine

## 2015-08-17 VITALS — BP 130/86 | Ht 73.0 in | Wt 245.0 lb

## 2015-08-17 DIAGNOSIS — I82401 Acute embolism and thrombosis of unspecified deep veins of right lower extremity: Secondary | ICD-10-CM | POA: Diagnosis not present

## 2015-08-17 DIAGNOSIS — G4700 Insomnia, unspecified: Secondary | ICD-10-CM

## 2015-08-17 DIAGNOSIS — K921 Melena: Secondary | ICD-10-CM | POA: Diagnosis not present

## 2015-08-17 DIAGNOSIS — G629 Polyneuropathy, unspecified: Secondary | ICD-10-CM

## 2015-08-17 DIAGNOSIS — F10282 Alcohol dependence with alcohol-induced sleep disorder: Secondary | ICD-10-CM | POA: Diagnosis not present

## 2015-08-17 LAB — POCT HEMOGLOBIN: HEMOGLOBIN: 18.5 g/dL — AB (ref 14.1–18.1)

## 2015-08-17 MED ORDER — HYDROCORTISONE 2.5 % RE CREA: 1.0000 "application " | TOPICAL_CREAM | Freq: Three times a day (TID) | RECTAL | Status: DC

## 2015-08-17 NOTE — Progress Notes (Signed)
   Subjective:    Patient ID: Bradley Miles, male    DOB: February 22, 1966, 49 y.o.   MRN: [id number] Patient arrives office for several concerns. Recently diagnosed DVT. HPI Patient here today for follow up visit on DVT in right leg. Patient currently taking Xarelto daily. Handling well.  Patient states that he is having trouble with hemorrhoids and blood in his stool. This has been present for the past several weeks now.   Unfortunately continues to drink [place] significant amount of alcohol. Patient generally drinks whiskey and often large amounts each day.  Patient notes ongoing challenges with anxiety.  Claims compliance with all medications.  Has not missed a dose of blood pressure for a while.   Review of Systems No headache no chest pain no back pain positive fatigue positive blood in stool no lightheadedness no syncope    Objective:   Physical Exam Alert vital stable blood pressure good HEENT normal. Lungs clear. Heart regular in rhythm. Abdomen benign. Rectal region small hemorrhoid with slight amount of blood prostate normal.  Hemoglobin 18       Assessment & Plan:  Impression alcohol abuse discussed once again patient needs to stop immediately #2 hypertension good control maintain same meds #3 GI bleed secondary to hemorrhoid. Aggravated by #4 #4 chronic Xarelto use #5 status post DVT leg stable plan maintain all medications. Diet exercise discussed. Follow-up as scheduled. Encouraged to stop drinking alcohol once. Warning signs as far as hemorrhage noted discussion held WSL addendum sensory neuropathy discussed. Folic acid and I-32 good. Likely due to alcohol toxicity discussion held

## 2015-08-18 ENCOUNTER — Other Ambulatory Visit: Payer: Self-pay | Admitting: Family Medicine

## 2015-08-18 ENCOUNTER — Other Ambulatory Visit: Payer: Self-pay | Admitting: *Deleted

## 2015-08-18 MED ORDER — ALLOPURINOL 100 MG PO TABS: 100.0000 mg | ORAL_TABLET | Freq: Every day | ORAL | Status: DC

## 2015-09-03 ENCOUNTER — Other Ambulatory Visit: Payer: Self-pay | Admitting: Family Medicine

## 2015-09-03 ENCOUNTER — Other Ambulatory Visit: Payer: Self-pay | Admitting: Adult Health

## 2015-09-05 ENCOUNTER — Other Ambulatory Visit: Payer: Self-pay | Admitting: Family Medicine

## 2015-10-13 ENCOUNTER — Ambulatory Visit: Payer: BLUE CROSS/BLUE SHIELD | Admitting: Family Medicine

## 2015-11-21 ENCOUNTER — Other Ambulatory Visit: Payer: Self-pay | Admitting: Family Medicine

## 2015-11-28 ENCOUNTER — Encounter: Payer: BLUE CROSS/BLUE SHIELD | Admitting: Adult Health

## 2015-11-28 NOTE — Progress Notes (Signed)
Cardiology Office Note   ERROR  

## 2015-12-19 ENCOUNTER — Other Ambulatory Visit: Payer: Self-pay | Admitting: Gastroenterology

## 2015-12-20 NOTE — Telephone Encounter (Signed)
Refill sent in for approximately 2 months. Hasn't been seen in over 3 years, will need a follow-up for additional refills.

## 2015-12-21 ENCOUNTER — Encounter: Payer: Self-pay | Admitting: Physician Assistant

## 2015-12-21 ENCOUNTER — Encounter: Payer: Self-pay | Admitting: *Deleted

## 2015-12-21 ENCOUNTER — Ambulatory Visit (INDEPENDENT_AMBULATORY_CARE_PROVIDER_SITE_OTHER): Payer: BLUE CROSS/BLUE SHIELD | Admitting: Physician Assistant

## 2015-12-21 VITALS — BP 118/82 | HR 83 | Ht 73.0 in | Wt 249.0 lb

## 2015-12-21 DIAGNOSIS — F17200 Nicotine dependence, unspecified, uncomplicated: Secondary | ICD-10-CM

## 2015-12-21 DIAGNOSIS — R079 Chest pain, unspecified: Secondary | ICD-10-CM

## 2015-12-21 DIAGNOSIS — I10 Essential (primary) hypertension: Secondary | ICD-10-CM | POA: Diagnosis not present

## 2015-12-21 DIAGNOSIS — R072 Precordial pain: Secondary | ICD-10-CM

## 2015-12-21 DIAGNOSIS — R002 Palpitations: Secondary | ICD-10-CM | POA: Diagnosis not present

## 2015-12-21 NOTE — Assessment & Plan Note (Addendum)
Patient complains a 2 day history of sharp shooting chest pain that comes and goes. He has no chest tightness or pressure. He drinks an exorbitant amount of alcohol daily. He has multiple cardiac risk factors including hypertension, tobacco abuse and had a recent DVT treated with Xarelto. Will check 2-D echo and Lexi scan Myoview. Follow-up with Dr.Koneswaran to become established in 3-4 weeks.

## 2015-12-21 NOTE — Assessment & Plan Note (Signed)
Had a long discussion with the patient concerning long-term effects of excessive alcohol including arrhythmias and cardiomyopathies. Patient does not seem inclined to cut back at this time. He needs counseling.

## 2015-12-21 NOTE — Progress Notes (Signed)
Cardiology Office Note   Date:  12/21/2015   ID:  Bradley, Miles August 31, 1966, MRN [id number]  PCP:  Bradley Hillier, MD  Cardiologist: Prior patient Dr. Lattie Miles  Chief Complaint: Chest pain and palpitations    History of Present Illness: Bradley Miles is a 50 y.o. male who presents for chest pain and palpitations.Patient has history of hypertension, PVCs, tobacco abuse, anxiety with panic attacks, palpitations with one episode of SVT documented on Holter monitor, and alcohol abuse. He also had a DVT in the right leg in 07/2015 treated with Xarelto. He hasn't seen Dr. Lattie Miles since 2011 but has been seeing Bradley Miles over the years. He had a stress test in 2003 that was normal and 2-D echo remotely that's not available.   Patient comes in today complaining of 2 day history of chest pain. He says it's a sharp shooting pain and sometimes goes to his left shoulder. It lasts a few seconds and comes and goes off and on. He has no shortness of breath or dizziness with it. Last night after work he felt his heart racing and it was relieved with coughing and punching his chest. 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 He does not drink caffeine but drinks a fifth of liquor every day that he doesn't work. On days he works he drinks a half of a fifth of liquor. He has a lot of stress at home. He is divorced and his son quit high school and is doing drugs. He continues to smoke cigarettes.    Past Medical History  Diagnosis Date  . Hypertension   . GERD (gastroesophageal reflux disease)   . Anxiety   . Palpitations   . Shortness of breath   . PVC's (premature ventricular contractions)     Past Surgical History  Procedure Laterality Date  . Esophagogastroduodenoscopy  05/19/09    RMR: Geographic distal esophageal erosions consistent with severe erosive reflux esophagitis. schatzki's ring s/P dilation/small hiatal hernia otherwise normal stomach  . Cyst  reomved      from spine  . Wisdom tooth extraction    . Colonoscopy with esophagogastroduodenoscopy (egd)  12/16/2012    Procedure: COLONOSCOPY WITH ESOPHAGOGASTRODUODENOSCOPY (EGD);  Surgeon: Bradley Dolin, MD;  [facility];  Service: Endoscopy;  Laterality: N/A;  7:30     Current Outpatient Prescriptions  Medication Sig Dispense Refill  . acebutolol (SECTRAL) 400 MG capsule TAKE (1) CAPSULE BY MOUTH TWICE DAILY. 60 capsule 3  . allopurinol (ZYLOPRIM) 100 MG tablet Take 1 tablet (100 mg total) by mouth daily. 30 tablet 5  . colchicine 0.6 MG tablet TAKE 1 TABLET BY MOUTH TWICE DAILY. 24 tablet 0  . DEXILANT 60 MG capsule TAKE (1) CAPSULE BY MOUTH ONCE DAILY. 30 capsule 2  . enalapril (VASOTEC) 5 MG tablet TAKE ONE TABLET BY MOUTH ONCE DAILY. 30 tablet 6  . hydrocortisone (ANUSOL-HC) 2.5 % rectal cream Place 1 application rectally 3 (three) times daily. X 7 days 30 g 0  . VIAGRA 100 MG tablet TAKE 1/2 TABLET ONCE DAILY. 3 tablet 0  . XARELTO 20 MG TABS tablet TAKE 1 TABLET ONCE DAILY WITH EVENING MEAL. 30 tablet 0   No current facility-administered medications for this visit.    Allergies:   Review of patient's allergies indicates no known allergies.    Social History:  The patient  reports that he has been smoking Cigarettes.  He started smoking about 36 years  ago. He has a 45 pack-year smoking history. He has never used smokeless tobacco. He reports that he drinks alcohol. He reports that he does not use illicit drugs.   Family History:  The patient's family history includes Cancer in his mother; Colon polyps (age of onset: 54) in his sister; Heart disease in his mother. There is no history of Liver disease.    ROS:  Please see the history of present illness.   Otherwise, review of systems are positive for none.   All other systems are reviewed and negative.    PHYSICAL EXAM: VS:  BP 118/82 mmHg  Pulse 83  Ht '6\' 1"'$  (1.854 m)  Wt 249 lb (112.946 kg)  BMI 32.86 kg/m2   SpO2 93% , BMI Body mass index is 32.86 kg/(m^2). GEN: Obese, well developed, in no acute distress Neck: no JVD, HJR, carotid bruits, or masses Cardiac: RRR; positive S4, no murmurs, rubs, thrill or heave,  Respiratory:  Decreased breath sounds but clear to auscultation bilaterally, normal work of breathing GI: soft, nontender, nondistended, + BS MS: no deformity or atrophy Extremities: without cyanosis, clubbing, edema, good distal pulses bilaterally.  Skin: warm and dry, no rash Neuro:  Strength and sensation are intact    EKG:  EKG is ordered today. The ekg ordered today demonstrates normal sinus rhythm, no acute change Recent Labs: 01/11/2015: BUN 5*; Creat 0.75; Magnesium 1.9; Platelets 228; Potassium 4.5; Sodium 140 07/15/2015: ALT 47*; TSH 2.010 08/17/2015: Hemoglobin 18.5*    Lipid Panel    Component Value Date/Time   CHOL 219* 03/17/2014 0801   TRIG 221* 03/17/2014 0801   HDL 51 03/17/2014 0801   CHOLHDL 4.3 03/17/2014 0801   VLDL 44* 03/17/2014 0801   LDLCALC 124* 03/17/2014 0801      Wt Readings from Last 3 Encounters:  12/21/15 249 lb (112.946 kg)  08/17/15 245 lb (111.131 kg)  07/18/15 247 lb (112.038 kg)      Other studies Reviewed: Additional studies/ records that were reviewed today include and review of the records demonstrates:  IMPRESSION: Deep venous thrombosis involving the distal popliteal vein extending into RIGHT calf.   Findings are being called to referring physician by the sonographer.     Electronically Signed   By: Bradley Miles M.D.   On: 07/15/2015 13:37   ASSESSMENT AND PLAN:  Chest pain Patient complains a 2 day history of sharp shooting chest pain that comes and goes. He has no chest tightness or pressure. He drinks an exorbitant amount of alcohol daily. He has multiple cardiac risk factors including hypertension, tobacco abuse and had a recent DVT treated with Xarelto. Will check 2-D echo and Lexi scan Myoview. Follow-up with  Bradley Miles to become established in 3-4 weeks.  Palpitations Patient had an episode of palpitations last night relieved with coughing and pounding on his chest. The episode only lasted 15 seconds. He's had documented PVCs and one run of SVT on Holter in the past. He is on Sectral. TSH was normal back in August 2016. Drinks some caffeine but not excessive. He drinks excessive alcohol. Recommend decrease alcohol intake and caffeine. Smoking cessation. Can take an extra Sectral if needed for palpitations. May need a Holter monitor in the future if it continues. We'll check labs today.  EtOH dependence Had a long discussion with the patient concerning long-term effects of excessive alcohol including arrhythmias and cardiomyopathies. Patient does not seem inclined to cut back at this time. He needs counseling.  TOBACCO ABUSE Needs to  quit smoking    Signed, Ermalinda Barrios, PA-C  12/21/2015 2:16 PM    [facility] [address], [place], [place]  [address] Phone: [phone]; Fax: [phone]

## 2015-12-21 NOTE — Assessment & Plan Note (Signed)
Needs to quit smoking

## 2015-12-21 NOTE — Patient Instructions (Addendum)
Your physician recommends that you schedule a follow-up appointment in: 1 Month with Dr. Bronson Ing  Your physician has requested that you have an echocardiogram. Echocardiography is a painless test that uses sound waves to create images of your heart. It provides your doctor with information about the size and shape of your heart and how well your heart's chambers and valves are working. This procedure takes approximately one hour. There are no restrictions for this procedure.  Your physician has requested that you have a lexiscan myoview. For further information please visit [email or web address]. Please follow instruction sheet, as given.  Your physician recommends that you continue on your current medications as directed. Please refer to the Current Medication list given to you today.  If you need a refill on your cardiac medications before your next appointment, please call your pharmacy.  Decrease Alcohol intake   Thank you for choosing [facility]!

## 2015-12-21 NOTE — Assessment & Plan Note (Signed)
Patient had an episode of palpitations last night relieved with coughing and pounding on his chest. The episode only lasted 15 seconds. He's had documented PVCs and one run of SVT on Holter in the past. He is on Sectral. TSH was normal back in August 2016. Drinks some caffeine but not excessive. He drinks excessive alcohol. Recommend decrease alcohol intake and caffeine. Smoking cessation. Can take an extra Sectral if needed for palpitations. May need a Holter monitor in the future if it continues. We'll check labs today.

## 2015-12-27 ENCOUNTER — Encounter (HOSPITAL_COMMUNITY): Payer: BLUE CROSS/BLUE SHIELD

## 2015-12-27 ENCOUNTER — Encounter: Payer: Self-pay | Admitting: Family Medicine

## 2015-12-27 ENCOUNTER — Ambulatory Visit (HOSPITAL_COMMUNITY)
Admission: RE | Admit: 2015-12-27 | Discharge: 2015-12-27 | Disposition: A | Discharge status: Home / Self Care | Payer: BLUE CROSS/BLUE SHIELD | Source: Ambulatory Visit | Attending: Physician Assistant | Admitting: Physician Assistant

## 2015-12-27 ENCOUNTER — Encounter (HOSPITAL_COMMUNITY)
Admission: RE | Admit: 2015-12-27 | Discharge: 2015-12-27 | Disposition: A | Discharge status: Home / Self Care | Payer: BLUE CROSS/BLUE SHIELD | Source: Ambulatory Visit | Attending: Physician Assistant | Admitting: Physician Assistant

## 2015-12-27 ENCOUNTER — Ambulatory Visit (INDEPENDENT_AMBULATORY_CARE_PROVIDER_SITE_OTHER): Payer: BLUE CROSS/BLUE SHIELD | Admitting: Family Medicine

## 2015-12-27 ENCOUNTER — Encounter (HOSPITAL_COMMUNITY): Payer: Self-pay

## 2015-12-27 ENCOUNTER — Other Ambulatory Visit: Payer: Self-pay | Admitting: Physician Assistant

## 2015-12-27 VITALS — BP 150/98 | Ht 73.0 in | Wt 255.0 lb

## 2015-12-27 DIAGNOSIS — G629 Polyneuropathy, unspecified: Secondary | ICD-10-CM | POA: Diagnosis not present

## 2015-12-27 DIAGNOSIS — R079 Chest pain, unspecified: Secondary | ICD-10-CM

## 2015-12-27 DIAGNOSIS — I10 Essential (primary) hypertension: Secondary | ICD-10-CM | POA: Diagnosis not present

## 2015-12-27 DIAGNOSIS — F10282 Alcohol dependence with alcohol-induced sleep disorder: Secondary | ICD-10-CM

## 2015-12-27 DIAGNOSIS — R072 Precordial pain: Secondary | ICD-10-CM | POA: Diagnosis present

## 2015-12-27 DIAGNOSIS — S0101XA Laceration without foreign body of scalp, initial encounter: Secondary | ICD-10-CM

## 2015-12-27 LAB — NM MYOCAR MULTI W/SPECT W/WALL MOTION / EF
CHL CUP NUCLEAR SSS: 1
CSEPED: 4 min
CSEPEW: 1 METS
CSEPPHR: 93 {beats}/min
Exercise duration (sec): 10 s
LHR: 0.4
LV dias vol: 93 mL
LVSYSVOL: 29 mL
NUC STRESS TID: 1.1
Rest HR: 76 {beats}/min
SDS: 1
SRS: 0

## 2015-12-27 MED ORDER — SODIUM CHLORIDE 0.9 % IJ SOLN
INTRAMUSCULAR | Status: AC
  Filled 2015-12-27: qty 3

## 2015-12-27 MED ORDER — REGADENOSON 0.4 MG/5ML IV SOLN
INTRAVENOUS | Status: AC
  Filled 2015-12-27: qty 5

## 2015-12-27 MED ORDER — REGADENOSON 0.4 MG/5ML IV SOLN
INTRAVENOUS | Status: AC
  Administered 2015-12-27: 0.4 mg via INTRAVENOUS
  Filled 2015-12-27: qty 5

## 2015-12-27 MED ORDER — SODIUM CHLORIDE 0.9 % IJ SOLN
INTRAMUSCULAR | Status: AC
Start: 2015-12-27 — End: 2015-12-27
  Administered 2015-12-27: 10 mL via INTRAVENOUS
  Filled 2015-12-27: qty 3

## 2015-12-27 MED ORDER — TECHNETIUM TC 99M SESTAMIBI GENERIC - CARDIOLITE
10.0000 | Freq: Once | INTRAVENOUS | Status: AC | PRN
  Administered 2015-12-27: 10 via INTRAVENOUS

## 2015-12-27 MED ORDER — TECHNETIUM TC 99M SESTAMIBI - CARDIOLITE
30.0000 | Freq: Once | INTRAVENOUS | Status: AC | PRN
  Administered 2015-12-27: 12:00:00 30 via INTRAVENOUS

## 2015-12-27 NOTE — Progress Notes (Signed)
   Subjective:    Patient ID: Bradley Miles, male    DOB: 03-Mar-1966, 50 y.o.   MRN: [id number]  HPIpt arrives today to discuss alcohol withdrawals.   Pt got into a coughing spell and passed out in parking lot. Hit his head. Patient first stated he may have passed out for a few minutes but on further history he is really not sure how long he was out. Pt states this has happened about 5 times in the past couple years. In fact he's been worked up for post tussive syncope.  Currently notes swelling and discomfort in his scalp with active bleeding  Admits that he is developed an alcohol problem. Drinking liquor. Sometimes his drinks as much as a fifth per day. Started with ear now has moved up to liquor unfortunately. Patient realizes he has a serious problem with this.  Patient still notes sensory neuropathy. This been evaluated in the past. Folic acid in the 12 were good.  Maintains xarelto with history of DVT.  Compliant blood pressure medication. No obvious side effects. Watching salt intake  Had [facility] test and [facility] this [facility] at [facility].   Review of Systems No current chest pain no abdominal pain no change in bowel habits no blood in stool    Objective:   Physical Exam  Alert blood pressure good on repeat vitals stable HET normal lungs clear heart regular in rhythm feet sensation diminished distally no acute distress no focal neurological deficits talking plainly without any type of distress scalp laceration present next  Patient was prepped draped anesthetized a 3 cm scalp lesion/laceration was repaired with 4-0 Ethilon suture      Assessment & Plan:  Impression 1 acute laceration repair #2 head injury with no residual neurological [place] up. 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 Sutures out in 10 days. Maintain all other medications. Warning signs discussed carefully with head injury. And fall. Follow-up as scheduled [facility]

## 2015-12-28 DIAGNOSIS — R079 Chest pain, unspecified: Secondary | ICD-10-CM | POA: Diagnosis not present

## 2015-12-28 LAB — CBC WITH DIFFERENTIAL/PLATELET
BASOS ABS: 0 10*3/uL (ref 0.0–0.2)
BASOS: 1 %
EOS (ABSOLUTE): 0.2 10*3/uL (ref 0.0–0.4)
EOS: 4 %
HEMOGLOBIN: 16.7 g/dL (ref 12.6–17.7)
Hematocrit: 48.2 % (ref 37.5–51.0)
IMMATURE GRANS (ABS): 0 10*3/uL (ref 0.0–0.1)
Immature Granulocytes: 0 %
LYMPHS: 18 %
Lymphocytes Absolute: 1.1 10*3/uL (ref 0.7–3.1)
MCH: 37.4 pg — AB (ref 26.6–33.0)
MCHC: 34.6 g/dL (ref 31.5–35.7)
MCV: 108 fL — ABNORMAL HIGH (ref 79–97)
MONOCYTES: 11 %
Monocytes Absolute: 0.7 10*3/uL (ref 0.1–0.9)
NEUTROS ABS: 4 10*3/uL (ref 1.4–7.0)
Neutrophils: 66 %
Platelets: 161 10*3/uL (ref 150–379)
RBC: 4.46 x10E6/uL (ref 4.14–5.80)
RDW: 13.6 % (ref 12.3–15.4)
WBC: 6 10*3/uL (ref 3.4–10.8)

## 2015-12-28 LAB — COMPREHENSIVE METABOLIC PANEL
ALBUMIN: 3.9 g/dL (ref 3.5–5.5)
ALT: 56 IU/L — ABNORMAL HIGH (ref 0–44)
AST: 74 IU/L — ABNORMAL HIGH (ref 0–40)
Albumin/Globulin Ratio: 1.6 (ref 1.1–2.5)
Alkaline Phosphatase: 62 IU/L (ref 39–117)
BILIRUBIN TOTAL: 1.4 mg/dL — AB (ref 0.0–1.2)
BUN / CREAT RATIO: 6 — AB (ref 9–20)
BUN: 5 mg/dL — AB (ref 6–24)
CHLORIDE: 98 mmol/L (ref 96–106)
CO2: 23 mmol/L (ref 18–29)
CREATININE: 0.82 mg/dL (ref 0.76–1.27)
Calcium: 9 mg/dL (ref 8.7–10.2)
GFR calc non Af Amer: 105 mL/min/{1.73_m2} (ref >59)
GFR, EST AFRICAN AMERICAN: 122 mL/min/{1.73_m2} (ref >59)
GLUCOSE: 135 mg/dL — AB (ref 65–99)
Globulin, Total: 2.5 g/dL (ref 1.5–4.5)
Potassium: 4 mmol/L (ref 3.5–5.2)
Sodium: 138 mmol/L (ref 134–144)
TOTAL PROTEIN: 6.4 g/dL (ref 6.0–8.5)

## 2016-01-05 ENCOUNTER — Ambulatory Visit (INDEPENDENT_AMBULATORY_CARE_PROVIDER_SITE_OTHER): Payer: BLUE CROSS/BLUE SHIELD | Admitting: Family Medicine

## 2016-01-05 ENCOUNTER — Encounter: Payer: Self-pay | Admitting: Family Medicine

## 2016-01-05 VITALS — BP 122/80 | Ht 73.0 in | Wt 251.2 lb

## 2016-01-05 DIAGNOSIS — I10 Essential (primary) hypertension: Secondary | ICD-10-CM | POA: Diagnosis not present

## 2016-01-05 DIAGNOSIS — G629 Polyneuropathy, unspecified: Secondary | ICD-10-CM

## 2016-01-05 DIAGNOSIS — M79641 Pain in right hand: Secondary | ICD-10-CM

## 2016-01-05 DIAGNOSIS — Z7901 Long term (current) use of anticoagulants: Secondary | ICD-10-CM

## 2016-01-05 DIAGNOSIS — I82401 Acute embolism and thrombosis of unspecified deep veins of right lower extremity: Secondary | ICD-10-CM | POA: Diagnosis not present

## 2016-01-05 MED ORDER — ALBUTEROL SULFATE HFA 108 (90 BASE) MCG/ACT IN AERS: 2.0000 | INHALATION_SPRAY | Freq: Four times a day (QID) | RESPIRATORY_TRACT | Status: DC | PRN

## 2016-01-05 NOTE — Progress Notes (Signed)
   Subjective:    Patient ID: Bradley Miles, male    DOB: 06/24/1966, 50 y.o.   MRN: [id number] Patient arrives office with numerous concerns. HPI Patient is here today to have sutures removed from his scalp. Patient received a laceration to his scalp about 10 days ago.   Has been on his relative for 5 Months for his DVT. Compliant with meds does not miss a dose. Frequent superficial thrombi does history in the past  Still continues to abuse alcohol unfortunately. States that alcohol treatment programs are 2 privacy for him right now.  Notes ongoing numbness in his feet left greater than right. Negative blood workup in the past. 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 Next  Notes hand pain right greater than left reason about some type of severe arthritis deep in the hands.  Patient states that he also was told by the doctor at his last visit that he could talk to him today about other issues he has going on.    Review of Systems  Constitutional: Negative for fever, activity change and appetite change.  HENT: Negative for congestion and rhinorrhea.   Eyes: Negative for discharge.  Respiratory: Negative for cough and wheezing.   Cardiovascular: Negative for chest pain.  Gastrointestinal: Negative for vomiting, abdominal pain and blood in stool.  Genitourinary: Negative for frequency and difficulty urinating.  Musculoskeletal: Negative for neck pain.  Skin: Negative for rash.  Allergic/Immunologic: Negative for environmental allergies and food allergies.  Neurological: Negative for weakness and headaches.  Psychiatric/Behavioral: Negative for agitation.       Objective:   Physical Exam Alert no acute distress. Sutures removed H&T normal lungs clear. Heart rare rhythm abdomen benign ankles trace edema bilateral. Pulses good sensation diminished left greater than right foot distal ventral surface       Assessment & Plan:  Impression 1 sensory neuropathy likely due to #2  #2 chronic alcohol abuse #3 chronic cough associated smoking and post tussive syncope patient wheezy at times #4 hand pain was concerned about arthritis #5 chronic chronic anticoagulation recurrent clots plan appropriate blood work to look it clotting. Stop smoking. Stop drinking. Recheck his order. Further recommendations based on blood work trial of Ventolin 2 sprays 4 times a day when necessary for wheezing [facility]

## 2016-01-19 ENCOUNTER — Other Ambulatory Visit: Payer: Self-pay | Admitting: Family Medicine

## 2016-01-23 ENCOUNTER — Encounter: Payer: Self-pay | Admitting: Cardiovascular Disease

## 2016-01-23 ENCOUNTER — Ambulatory Visit (INDEPENDENT_AMBULATORY_CARE_PROVIDER_SITE_OTHER): Payer: BLUE CROSS/BLUE SHIELD | Admitting: Cardiovascular Disease

## 2016-01-23 VITALS — BP 138/102 | HR 85 | Ht 73.0 in | Wt 249.0 lb

## 2016-01-23 DIAGNOSIS — R072 Precordial pain: Secondary | ICD-10-CM | POA: Diagnosis not present

## 2016-01-23 DIAGNOSIS — I10 Essential (primary) hypertension: Secondary | ICD-10-CM | POA: Diagnosis not present

## 2016-01-23 DIAGNOSIS — R002 Palpitations: Secondary | ICD-10-CM

## 2016-01-23 DIAGNOSIS — I471 Supraventricular tachycardia: Secondary | ICD-10-CM | POA: Diagnosis not present

## 2016-01-23 MED ORDER — ENALAPRIL MALEATE 10 MG PO TABS: 10.0000 mg | ORAL_TABLET | Freq: Every day | ORAL | Status: DC

## 2016-01-23 NOTE — Patient Instructions (Signed)
   Increase your Enalapril to '10mg'$  daily - new sent to [place] today. Continue all other medications.   Your physician wants you to follow up in:  1 year.  You will receive a reminder letter in the mail one-two months in advance.  If you don't receive a letter, please call our office to schedule the follow up appointment

## 2016-01-23 NOTE — Progress Notes (Signed)
Patient ID: Bradley Miles, male   DOB: 1966/02/08, 50 y.o.   MRN: [id number]      SUBJECTIVE: The patient is a 50 year old male who I am evaluating for the first time. He saw Gerrianne Scale PA-C on 12/21/15 and was complaining of chest pain.  He has a history of hypertension, PSVT, tobacco and alcohol abuse, anxiety with panic attacks , palpitations, and right leg DVT for which he takes Xarelto.  Nuclear stress testing on 12/27/15 showed no evidence of myocardial ischemia or scar and was deemed a low risk study, calculated LVEF 69%.  Echocardiogram on 12/27/15 demonstrated normal left ventricular systolic function and regional wall motion, LVEF 60-65%, with mild LVH and normal diastolic function.  Has palpitations but says there are no more frequent than they were one year ago. He wore a 7 day event monitor in  12/2014 which demonstrated normal sinus rhythm with PVCs and a brief episode of SVT.  Review of Systems: As per "subjective", otherwise negative.  No Known Allergies  Current Outpatient Prescriptions  Medication Sig Dispense Refill  . acebutolol (SECTRAL) 400 MG capsule TAKE (1) CAPSULE BY MOUTH TWICE DAILY. 60 capsule 2  . albuterol (PROVENTIL HFA;VENTOLIN HFA) 108 (90 Base) MCG/ACT inhaler Inhale 2 puffs into the lungs 4 (four) times daily as needed for wheezing or shortness of breath. 1 Inhaler 0  . allopurinol (ZYLOPRIM) 100 MG tablet Take 1 tablet (100 mg total) by mouth daily. 30 tablet 5  . colchicine 0.6 MG tablet TAKE 1 TABLET BY MOUTH TWICE DAILY. 24 tablet 0  . DEXILANT 60 MG capsule TAKE (1) CAPSULE BY MOUTH ONCE DAILY. 30 capsule 2  . enalapril (VASOTEC) 5 MG tablet TAKE ONE TABLET BY MOUTH ONCE DAILY. 30 tablet 6  . VIAGRA 100 MG tablet TAKE 1/2 TABLET ONCE DAILY. 3 tablet 0  . XARELTO 20 MG TABS tablet TAKE 1 TABLET ONCE DAILY WITH EVENING MEAL. 30 tablet 0   No current facility-administered medications for this visit.    Past Medical History  Diagnosis Date  .  Hypertension   . GERD (gastroesophageal reflux disease)   . Anxiety   . Palpitations   . Shortness of breath   . PVC's (premature ventricular contractions)     Past Surgical History  Procedure Laterality Date  . Esophagogastroduodenoscopy  05/19/09    RMR: Geographic distal esophageal erosions consistent with severe erosive reflux esophagitis. schatzki's ring s/P dilation/small hiatal hernia otherwise normal stomach  . Cyst reomved      from spine  . Wisdom tooth extraction    . Colonoscopy with esophagogastroduodenoscopy (egd)  12/16/2012    Procedure: COLONOSCOPY WITH ESOPHAGOGASTRODUODENOSCOPY (EGD);  Surgeon: Daneil Dolin, MD;  [facility];  Service: Endoscopy;  Laterality: N/A;  7:30    Social History   Social History  . Marital Status: Single    Spouse Name: N/A  . Number of Children: 2  . Years of Education: N/A   Occupational History  . [employer], corporate   Social History Main Topics  . Smoking status: Current Every Day Smoker -- 1.50 packs/day for 30 years    Types: Cigarettes    Start date: 12/24/1979  . Smokeless tobacco: Never Used  . Alcohol Use: 0.0 oz/week    0 Standard drinks or equivalent per week     Comment: consumes beer or liquor three to four times weekly. Up to six pack or three shots each occurence or more  x 20+ yrs  . Drug Use: No  . Sexual Activity:    Partners: Female    Museum/gallery curator: None   Other Topics Concern  . Not on file   Social History Narrative   Lives alone  Girlfriend & kids 1/2 time     [place] Vitals:   01/23/16 1500  BP: 138/102  Pulse: 85  Height: '6\' 1"'$  (1.854 m)  Weight: 249 lb (112.946 kg)  SpO2: 97%    PHYSICAL EXAM General: NAD HEENT: Normal. Neck: No JVD, no thyromegaly. Lungs: Diminished throughout but clear w/o rales or wheezes. CV: Nondisplaced PMI.  Regular rate and rhythm, normal S1/S2, no S3/S4, no murmur. Trivial pretibial and periankle edema.  No  carotid bruit.   Abdomen: Soft, obese.  Neurologic: Alert and oriented.  Psych: Normal affect. Skin: Normal. Musculoskeletal: No gross deformities.  ECG: Most recent ECG reviewed.      ASSESSMENT AND PLAN: 1. Chest pain: Likely non-cardiac given low risk nuclear stress test and normal LV function.  2. Essential HTN: Elevated. Increase enalapril to 10 mg daily.  3. Palpitations/PSVT: Stable. Continue acebutolol.  4. DVT: On Xarelto.  Dispo: f/u 1 yr with [facility].  Kate Sable, M.D., F.A.C.C.

## 2016-02-21 ENCOUNTER — Ambulatory Visit: Payer: BLUE CROSS/BLUE SHIELD | Admitting: Family Medicine

## 2016-02-22 DIAGNOSIS — Z029 Encounter for administrative examinations, unspecified: Secondary | ICD-10-CM

## 2016-02-29 ENCOUNTER — Ambulatory Visit (HOSPITAL_COMMUNITY)
Admission: RE | Admit: 2016-02-29 | Discharge: 2016-02-29 | Disposition: A | Discharge status: Home / Self Care | Payer: BLUE CROSS/BLUE SHIELD | Source: Ambulatory Visit | Attending: Family Medicine | Admitting: Family Medicine

## 2016-02-29 DIAGNOSIS — M79641 Pain in right hand: Secondary | ICD-10-CM | POA: Diagnosis not present

## 2016-03-01 ENCOUNTER — Telehealth: Payer: Self-pay | Admitting: *Deleted

## 2016-03-01 ENCOUNTER — Other Ambulatory Visit: Payer: Self-pay | Admitting: *Deleted

## 2016-03-01 MED ORDER — ETODOLAC 400 MG PO TABS: 400.0000 mg | ORAL_TABLET | Freq: Two times a day (BID) | ORAL | Status: DC

## 2016-03-01 NOTE — Telephone Encounter (Signed)
Med sent to pharm. Pt notified.  

## 2016-03-01 NOTE — Telephone Encounter (Signed)
Lod 400 one bid 28

## 2016-03-01 NOTE — Telephone Encounter (Signed)
Discussed results of hand xray. Pt states pain is really bad today and having some swelling. Pt taking ibuprofen and its not touching the pain. Can something be called into [facility] [place].

## 2016-03-14 ENCOUNTER — Telehealth: Payer: Self-pay | Admitting: Family Medicine

## 2016-03-14 NOTE — Telephone Encounter (Signed)
Nurses ck med hx and refill times six

## 2016-03-14 NOTE — Telephone Encounter (Signed)
Pt would like a refill on his Acyclovir please   [facility]   Has blister and would like to try to get some of this on board

## 2016-03-15 MED ORDER — VALACYCLOVIR HCL 1 G PO TABS: ORAL_TABLET | ORAL | Status: DC

## 2016-03-15 NOTE — Telephone Encounter (Signed)
Notified patient med was sent in.

## 2016-03-15 NOTE — Telephone Encounter (Signed)
[facility] 03/15/16

## 2016-03-19 ENCOUNTER — Other Ambulatory Visit: Payer: Self-pay | Admitting: Internal Medicine

## 2016-03-19 ENCOUNTER — Other Ambulatory Visit: Payer: Self-pay | Admitting: Family Medicine

## 2016-03-20 NOTE — Telephone Encounter (Signed)
Ok plus two ref

## 2016-05-04 ENCOUNTER — Telehealth: Payer: Self-pay | Admitting: Family Medicine

## 2016-05-04 NOTE — Telephone Encounter (Signed)
Pt is requesting a referral to see Dr Scot Dock [facility] [place]  For the blood clots in his lower extremity   He feels he needs this sooner rather than later Because when he walks more than 100 feet he starts having pain in his feet And right leg   Please advise, he tried to call an schedule on his own but was told he needs a Referral

## 2016-05-08 NOTE — Telephone Encounter (Signed)
rec start here we can refer him but with acute pain likely needs w u sooner than later and often takes weeks to get in to see specialist

## 2016-05-08 NOTE — Telephone Encounter (Signed)
Spoke with patient and informed him per Dr.Steve Luking- recommend start here we can refer him but with acute pain likely needs work up sooner than later and often takes weeks to get in to see specialist. Patient verbalized understanding and was transferred to front desk to schedule appointment for tomorrow.

## 2016-05-09 ENCOUNTER — Encounter: Payer: Self-pay | Admitting: Family Medicine

## 2016-05-09 ENCOUNTER — Ambulatory Visit (INDEPENDENT_AMBULATORY_CARE_PROVIDER_SITE_OTHER): Payer: BLUE CROSS/BLUE SHIELD | Admitting: Family Medicine

## 2016-05-09 VITALS — BP 130/82 | Ht 73.0 in | Wt 246.4 lb

## 2016-05-09 DIAGNOSIS — I739 Peripheral vascular disease, unspecified: Secondary | ICD-10-CM | POA: Diagnosis not present

## 2016-05-09 DIAGNOSIS — I872 Venous insufficiency (chronic) (peripheral): Secondary | ICD-10-CM

## 2016-05-09 DIAGNOSIS — I8310 Varicose veins of unspecified lower extremity with inflammation: Secondary | ICD-10-CM

## 2016-05-09 DIAGNOSIS — G629 Polyneuropathy, unspecified: Secondary | ICD-10-CM

## 2016-05-09 NOTE — Progress Notes (Signed)
   Subjective:    Patient ID: Bradley Miles, male    DOB: [birth year], 50 y.o.   MRN: [id number] Patient presents with several distinct concerns HPI Patient arrives with c/o bilateral foot and leg pain- like a pins and needles effect with pain and tiredness in calfs-been going on for a while but getting worse.  Progressive pain with sens of pins and needles, diagnosed with neuropathy year ago. At that time workup revealed sensory neuropathy. Was felt to be due to alcohol abuse. Unfortunately still uses alcohol  calf muscles tired and hurting, after fifty to 100 ft develops pain deep in the calf right more than left. Deep ache in the calf which appears after walking 50-100 feet. Strong family history peripheral arterial disease [phone]   Hx of one dvt and multiple superficial thrombphlebiti. Was on Xarelto 1.. No recent superficial thrombophlebitis symptoms. Continues to have swollen veins. Wonders if anything else can be done about. Some swollen ankles after being up all day Review of Systems No headache, no major weight loss or weight gain, no chest pain no back pain abdominal pain no change in bowel habits complete ROS otherwise negative     Objective:   Physical Exam Alert vitals stable blood pressure good on repeat HEENT normal. Lungs clear. Heart regular in rhythm. Both leg significant venous stasis changes. Significant varicosities crepitations left knee greater than right knee distal sensation diminished diffusely both feet both plantar and dorsal surfaces. Arterial pulses appear diminished bilaterally but worse in the right foot       Assessment & Plan:  Impression 1 probable arterial compromise with element of claudication #2 venous stasis considerable with history of prior venous clots. No evidence of active disease today discussed #3 peripheral neuropathy discussed likely secondary to alcohol abuse plan encouraged stop using alcohol. Maintain same dose of blood pressure  meds diet exercise discussed. See no evidence of acute DVT. Recommend vascular surgery referral to assess both arterial flow and venous function

## 2016-05-10 ENCOUNTER — Encounter: Payer: Self-pay | Admitting: Family Medicine

## 2016-05-24 ENCOUNTER — Telehealth: Payer: Self-pay | Admitting: Family Medicine

## 2016-05-24 NOTE — Telephone Encounter (Signed)
Patient's wife called on behalf of patient stating that patient is experiencing pain in leg that travels to groin area that is tender to touch with warmth. Patient has hx of DVT and chronic venous insufficiency. Discussed with Dr.Steve Luking and was advised to have patient go to the ER ASAP for scan due to possible DVT. Patient's wife verbalized understanding and stated that she would have patient go to the ER.

## 2016-05-25 ENCOUNTER — Encounter: Payer: Self-pay | Admitting: Family Medicine

## 2016-05-25 ENCOUNTER — Other Ambulatory Visit: Payer: Self-pay

## 2016-05-25 ENCOUNTER — Other Ambulatory Visit (HOSPITAL_COMMUNITY)
Admission: RE | Admit: 2016-05-25 | Discharge: 2016-05-25 | Disposition: A | Discharge status: Home / Self Care | Payer: BLUE CROSS/BLUE SHIELD | Source: Ambulatory Visit | Attending: Family Medicine | Admitting: Family Medicine

## 2016-05-25 ENCOUNTER — Ambulatory Visit (INDEPENDENT_AMBULATORY_CARE_PROVIDER_SITE_OTHER): Payer: BLUE CROSS/BLUE SHIELD | Admitting: Family Medicine

## 2016-05-25 ENCOUNTER — Ambulatory Visit (HOSPITAL_COMMUNITY)
Admission: RE | Admit: 2016-05-25 | Discharge: 2016-05-25 | Disposition: A | Discharge status: Home / Self Care | Payer: BLUE CROSS/BLUE SHIELD | Source: Ambulatory Visit | Attending: Family Medicine | Admitting: Family Medicine

## 2016-05-25 ENCOUNTER — Telehealth: Payer: Self-pay

## 2016-05-25 VITALS — BP 110/80 | Temp 98.3°F | Ht 73.0 in | Wt 251.2 lb

## 2016-05-25 DIAGNOSIS — M79605 Pain in left leg: Secondary | ICD-10-CM | POA: Insufficient documentation

## 2016-05-25 DIAGNOSIS — I82812 Embolism and thrombosis of superficial veins of left lower extremities: Secondary | ICD-10-CM

## 2016-05-25 DIAGNOSIS — I8002 Phlebitis and thrombophlebitis of superficial vessels of left lower extremity: Secondary | ICD-10-CM | POA: Diagnosis not present

## 2016-05-25 LAB — BASIC METABOLIC PANEL
Anion gap: 12 (ref 5–15)
CHLORIDE: 102 mmol/L (ref 101–111)
CO2: 24 mmol/L (ref 22–32)
Calcium: 8.6 mg/dL — ABNORMAL LOW (ref 8.9–10.3)
Creatinine, Ser: 0.66 mg/dL (ref 0.61–1.24)
GFR calc Af Amer: >60 mL/min (ref >60)
GLUCOSE: 123 mg/dL — AB (ref 65–99)
POTASSIUM: 3.4 mmol/L — AB (ref 3.5–5.1)
SODIUM: 138 mmol/L (ref 135–145)

## 2016-05-25 LAB — HEPATIC FUNCTION PANEL
ALBUMIN: 3.7 g/dL (ref 3.5–5.0)
ALK PHOS: 57 U/L (ref 38–126)
ALT: 46 U/L (ref 17–63)
AST: 65 U/L — AB (ref 15–41)
BILIRUBIN TOTAL: 0.8 mg/dL (ref 0.3–1.2)
Bilirubin, Direct: 0.2 mg/dL (ref 0.1–0.5)
Indirect Bilirubin: 0.6 mg/dL (ref 0.3–0.9)
TOTAL PROTEIN: 6.9 g/dL (ref 6.5–8.1)

## 2016-05-25 LAB — CBC WITH DIFFERENTIAL/PLATELET
BASOS ABS: 0.1 10*3/uL (ref 0.0–0.1)
BASOS PCT: 1 %
EOS PCT: 5 %
Eosinophils Absolute: 0.3 10*3/uL (ref 0.0–0.7)
HEMATOCRIT: 47 % (ref 39.0–52.0)
Hemoglobin: 16.6 g/dL (ref 13.0–17.0)
Lymphocytes Relative: 25 %
Lymphs Abs: 1.6 10*3/uL (ref 0.7–4.0)
MCH: 39.1 pg — ABNORMAL HIGH (ref 26.0–34.0)
MCHC: 35.3 g/dL (ref 30.0–36.0)
MCV: 110.6 fL — ABNORMAL HIGH (ref 78.0–100.0)
MONO ABS: 0.8 10*3/uL (ref 0.1–1.0)
Monocytes Relative: 13 %
NEUTROS ABS: 3.6 10*3/uL (ref 1.7–7.7)
Neutrophils Relative %: 56 %
PLATELETS: 136 10*3/uL — AB (ref 150–400)
RBC: 4.25 MIL/uL (ref 4.22–5.81)
RDW: 13.5 % (ref 11.5–15.5)
WBC: 6.4 10*3/uL (ref 4.0–10.5)

## 2016-05-25 LAB — D-DIMER, QUANTITATIVE (NOT AT ARMC): D DIMER QUANT: 1 ug{FEU}/mL — AB (ref 0.00–0.50)

## 2016-05-25 LAB — ANTITHROMBIN III: AntiThromb III Func: 81 % (ref 75–120)

## 2016-05-25 MED ORDER — RIVAROXABAN 20 MG PO TABS: ORAL_TABLET | ORAL | Status: DC

## 2016-05-25 MED ORDER — CEPHALEXIN 500 MG PO CAPS: 500.0000 mg | ORAL_CAPSULE | Freq: Three times a day (TID) | ORAL | Status: DC

## 2016-05-25 NOTE — Progress Notes (Signed)
   Subjective:    Patient ID: Bradley Miles, male    DOB: 1966-08-04, 50 y.o.   MRN: [id number] Patient arrives urgently with complicated concerns Leg Pain  The incident occurred more than 1 week ago. There was no injury mechanism. The pain is present in the left leg. The quality of the pain is described as aching. The pain is moderate. The pain has been intermittent since onset. He reports no foreign bodies present. The symptoms are aggravated by movement. He has tried nothing for the symptoms. The treatment provided no relief.    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 In addition, he has a specialist referral pending for later this summer. He experienced significant pain in his left leg extending into his thigh and groin. Next  Called [place] late in the day yesterday we advised emergency room visit patient did not do that. Comes in today with ongoing symptoms pain and discomfort.  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    Review of Systems No headache, no major weight loss or weight gain, no chest pain no back pain abdominal pain no change in bowel habits complete ROS otherwise negative     Objective:   Physical Exam Alert vitals stable somewhat anxious appearing lungs clear heart rare rhythm left medial thigh tender palpable cord left groin tenderness palpable masses or adenopathy. Edema somewhat increased sent for urgent labs  Results for orders placed or performed during the hospital encounter of 12/27/15  NM Myocar Multi W/Spect W/Wall Motion / EF  Result Value Ref Range   Rest HR 76 bpm   Rest BP 96/70 mmHg   Exercise duration (min) 4 min   Exercise duration (sec) 10 sec   Estimated workload 1.0 METS   Peak HR 93 bpm   Peak BP 116/81 mmHg   MPHR  bpm   Percent HR  %   RPE     LV sys  vol 29 mL   TID 1.10    LV dias vol 93 mL   LHR 0.40    SSS 1    SRS 0    SDS 1           Assessment & Plan:  Impression 1 acute left saphenous vein thrombosis. Extending well into the thigh. This is considered superficial but has potential to go deep. With patient's history of prior DVTs we will press on and currently Xarelto. Also antibiotics prescribed. In addition we'll re-ultrasounded next week. Local measures discussed. Warning signs discussed WSL

## 2016-05-25 NOTE — Telephone Encounter (Signed)
Notified patient per Dr. Richardson Landry- based on ultrasound results  start on Xarelto 20 mg daily and Keflex 500 mg TID x 7 days. Meds sent to pharmacy.  Repeat ultrasound of left leg on Tuesday May 29, 2016 at 2:30 pm. Follow up with Dr. Richardson Landry in 2 weeks. Patient verbalized understanding.

## 2016-05-28 LAB — HOMOCYSTEINE: HOMOCYSTEINE-NORM: 32.8 umol/L — AB (ref 0.0–15.0)

## 2016-05-29 ENCOUNTER — Other Ambulatory Visit: Payer: Self-pay | Admitting: *Deleted

## 2016-05-29 ENCOUNTER — Ambulatory Visit (HOSPITAL_COMMUNITY)
Admission: RE | Admit: 2016-05-29 | Discharge: 2016-05-29 | Disposition: A | Discharge status: Home / Self Care | Payer: BLUE CROSS/BLUE SHIELD | Source: Ambulatory Visit | Attending: Family Medicine | Admitting: Family Medicine

## 2016-05-29 ENCOUNTER — Other Ambulatory Visit: Payer: Self-pay | Admitting: Family Medicine

## 2016-05-29 DIAGNOSIS — I82812 Embolism and thrombosis of superficial veins of left lower extremities: Secondary | ICD-10-CM | POA: Diagnosis present

## 2016-05-29 DIAGNOSIS — R0989 Other specified symptoms and signs involving the circulatory and respiratory systems: Secondary | ICD-10-CM

## 2016-05-29 DIAGNOSIS — I83893 Varicose veins of bilateral lower extremities with other complications: Secondary | ICD-10-CM

## 2016-05-29 DIAGNOSIS — I8002 Phlebitis and thrombophlebitis of superficial vessels of left lower extremity: Secondary | ICD-10-CM | POA: Diagnosis not present

## 2016-05-29 NOTE — Telephone Encounter (Signed)
Pt should likely not take this when on xarelto, use tylenol instead

## 2016-05-30 ENCOUNTER — Ambulatory Visit (HOSPITAL_COMMUNITY): Payer: BLUE CROSS/BLUE SHIELD

## 2016-06-01 LAB — FACTOR 5 LEIDEN

## 2016-06-01 LAB — CARDIOLIPIN ANTIBODIES, IGG, IGM, IGA
Anticardiolipin IgA: <9 APL U/mL (ref 0–11)
Anticardiolipin IgM: <9 MPL U/mL (ref 0–12)

## 2016-06-01 LAB — DRVVT MIX: dRVVT Mix: 39.5 s (ref 0.0–47.0)

## 2016-06-01 LAB — PROTEIN C ACTIVITY: Protein C Activity: 80 % (ref 73–180)

## 2016-06-01 LAB — LUPUS ANTICOAGULANT PANEL
DRVVT: 51.5 s — AB (ref 0.0–47.0)
PTT Lupus Anticoagulant: 39.8 s (ref 0.0–43.6)

## 2016-06-01 LAB — BETA-2-GLYCOPROTEIN I ABS, IGG/M/A
Beta-2 Glyco I IgG: <9 GPI IgG units (ref 0–20)
Beta-2-Glycoprotein I IgA: <9 GPI IgA units (ref 0–25)

## 2016-06-01 LAB — PROTEIN S ACTIVITY: Protein S Activity: 211 % — ABNORMAL HIGH (ref 63–140)

## 2016-06-01 LAB — PROTHROMBIN GENE MUTATION

## 2016-06-01 LAB — PROTEIN S, TOTAL: Protein S Ag, Total: 113 % (ref 60–150)

## 2016-06-01 LAB — PROTEIN C, TOTAL: Protein C, Total: 67 % (ref 60–150)

## 2016-06-22 ENCOUNTER — Encounter: Payer: Self-pay | Admitting: Family Medicine

## 2016-06-22 ENCOUNTER — Ambulatory Visit (INDEPENDENT_AMBULATORY_CARE_PROVIDER_SITE_OTHER): Payer: BLUE CROSS/BLUE SHIELD | Admitting: Family Medicine

## 2016-06-22 VITALS — BP 126/82 | Ht 73.0 in | Wt 247.1 lb

## 2016-06-22 DIAGNOSIS — K921 Melena: Secondary | ICD-10-CM | POA: Diagnosis not present

## 2016-06-22 LAB — POCT HEMOGLOBIN: Hemoglobin: 16.8 g/dL (ref 14.1–18.1)

## 2016-06-22 NOTE — Progress Notes (Signed)
   Subjective:    Patient ID: Bradley Miles, male    DOB: May 21, 1966, 50 y.o.   MRN: [id number]  HPI  Patient in today for blood stool. Onset Tuesday.  Patient has a history of blood clots. He was treated back in 2016 for blood clot in his leg. He had a hypercoagulability panel was not felt to be at a higher risk but now he has a reoccurrence plus also has a family history of blood clots his mom had blood clots when she was later in life she died in her 72s from cancer. Also patient smokes.  Results for orders placed or performed in visit on 06/22/16  POCT hemoglobin  Result Value Ref Range   Hemoglobin 16.8 14.1 - 18.1 g/dL  Patient states he saw blood in his bowel movements for 2-3 days it is mixed in the stool water was red bloody appearing did not appear so when he wiped. He states he has not had any past 2 days. He has been on a blood thinner for the past several weeks. Patient denies heavy bleeding. Denies abdominal pain. Denies vomiting. Review of Systems He denies any chest pain shortness breath abdominal pain vomiting diarrhea he denies weight loss fevers chills or sweats    Objective:   Physical Exam Lungs clear hearts regular abdomen soft no guarding rebound or tenderness extremities no edema  25 minutes spent on this patient in additional time spent afterwards talking with specialists     Assessment & Plan:  An incredibly complex situation patient appears to need to be on that and quite light but also with blood in the stools this is concerning he had a colonoscopy in 2014 which showed internal hemorrhoids  We will coordinate input from gastroenterology and hematology regarding this patient  Increase clotting concerns. I believe that this patient is at risk of repetitive clots because of his history. I have spoken with Dr. Whitney Muse regarding this case. We will get the patient in with hematology for further evaluation currently Dr. Whitney Muse recommends holding the medication  for a few days to make sure there is no sign of any bleeding then depending on how the leg is doing make the decision whether or not to restart medicine.  Patient currently does not have evidence of a deep venous thrombosis but does have superficial venous thrombosis. He is at risk of developing a deep 1. For now we will hold off on the Xarelto. We will discuss the case with Dr.Rourke on Monday. Then call the patient. Patient may need a repeat colonoscopy. Or this could be all due to internal hemorrhoid issue.  The patient was talked to at length. He agrees with getting consultation with hematologist. He also understands that if his leg starts given him troubles he needs a follow-up immediately or notify us

## 2016-06-26 ENCOUNTER — Telehealth: Payer: Self-pay

## 2016-06-26 NOTE — Telephone Encounter (Signed)
Please call the patient. Make sure he knows that I spoke with Dr.Rourk. The GI specialist did not feel that he needed another colonoscopy at this point. He did state that it's very important for him to take stool softeners to keep his bowel movements soft to avoid straining. GI specialist recommended that he stay off his blood thinner for one week then restart it. If he has reoccurrence of blood in the stool the patient needs to let us know. Obviously if he has a large amount of blood in the stool he should get this checked out right away. It would be wise for this patient to follow-up with Dr. Richardson Landry within the next couple weeks.

## 2016-06-26 NOTE — Telephone Encounter (Signed)
Dr. Nicki Reaper spoke with Dr. Sydell Axon

## 2016-06-27 NOTE — Telephone Encounter (Signed)
Notified patient Dr. Nicki Reaper spoke with Dr.Rourk. The GI specialist did not feel that he needed another colonoscopy at this point. He did state that it's very important for him to take stool softeners to keep his bowel movements soft to avoid straining. GI specialist recommended that he stay off his blood thinner for one week then restart it. If he has reoccurrence of blood in the stool the patient needs to let us know. Obviously if he has a large amount of blood in the stool he should get this checked out right away. It would be wise for this patient to follow-up with Dr. Richardson Landry within the next couple weeks. Patient verbalized understanding.

## 2016-06-28 ENCOUNTER — Telehealth: Payer: Self-pay | Admitting: Family Medicine

## 2016-06-28 NOTE — Telephone Encounter (Signed)
Please make sure that the patient is referred to Dr. Whitney Muse due to repetitive blood clots-he is aware that we are setting him up

## 2016-06-29 ENCOUNTER — Other Ambulatory Visit: Payer: Self-pay | Admitting: *Deleted

## 2016-06-29 DIAGNOSIS — D759 Disease of blood and blood-forming organs, unspecified: Secondary | ICD-10-CM

## 2016-06-29 NOTE — Telephone Encounter (Signed)
Referral put in.

## 2016-07-02 ENCOUNTER — Encounter: Payer: Self-pay | Admitting: Family Medicine

## 2016-07-04 ENCOUNTER — Other Ambulatory Visit: Payer: Self-pay | Admitting: Family Medicine

## 2016-07-19 ENCOUNTER — Encounter: Payer: Self-pay | Admitting: Vascular Surgery

## 2016-07-24 ENCOUNTER — Ambulatory Visit (HOSPITAL_COMMUNITY)
Admission: RE | Admit: 2016-07-24 | Discharge: 2016-07-24 | Disposition: A | Discharge status: Home / Self Care | Payer: BLUE CROSS/BLUE SHIELD | Source: Ambulatory Visit | Attending: Vascular Surgery | Admitting: Vascular Surgery

## 2016-07-24 ENCOUNTER — Encounter: Payer: Self-pay | Admitting: Vascular Surgery

## 2016-07-24 ENCOUNTER — Ambulatory Visit (INDEPENDENT_AMBULATORY_CARE_PROVIDER_SITE_OTHER): Payer: BLUE CROSS/BLUE SHIELD | Admitting: Vascular Surgery

## 2016-07-24 VITALS — BP 122/77 | Temp 97.8°F | Resp 18 | Ht 71.25 in | Wt 247.5 lb

## 2016-07-24 DIAGNOSIS — I10 Essential (primary) hypertension: Secondary | ICD-10-CM | POA: Diagnosis not present

## 2016-07-24 DIAGNOSIS — R609 Edema, unspecified: Secondary | ICD-10-CM | POA: Diagnosis present

## 2016-07-24 DIAGNOSIS — K219 Gastro-esophageal reflux disease without esophagitis: Secondary | ICD-10-CM | POA: Diagnosis not present

## 2016-07-24 DIAGNOSIS — F419 Anxiety disorder, unspecified: Secondary | ICD-10-CM | POA: Insufficient documentation

## 2016-07-24 DIAGNOSIS — I83893 Varicose veins of bilateral lower extremities with other complications: Secondary | ICD-10-CM | POA: Diagnosis not present

## 2016-07-24 DIAGNOSIS — R0989 Other specified symptoms and signs involving the circulatory and respiratory systems: Secondary | ICD-10-CM

## 2016-07-24 DIAGNOSIS — I82401 Acute embolism and thrombosis of unspecified deep veins of right lower extremity: Secondary | ICD-10-CM | POA: Diagnosis not present

## 2016-07-24 NOTE — Progress Notes (Signed)
Patient name: Bradley Miles MRN: [id number] DOB: 01-11-1966 Sex: male  REASON FOR CONSULT: Bilateral lower extremity venous pathology  HPI: Bradley Miles is a 50 y.o. 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 He was treated with anticoagulation related to this. He did well overall. 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 He had an episode roughly one month ago where he had acute thrombus in the varicosities and these left anterior thigh. Noninvasive study at that time showed no evidence of DVT. He is seen today for further discussion. He did undergo a hypercoagulable workup with no strong evidence for hypercoagulability. He does have a strong family history of venous pathology and venous thrombus in his mother. He does report the components of discomfort in his lower extremities. The main discomfort of this appears to be more neuropathy with burning and tingling sensation in both feet. He does report some aching sensation in his calves with prolonged standing and prolonged walking. He does wear compression garments for symptomatic relief. He did have the episodes of blood in stool while on anticoagulation and this was discontinued but resumed when he had the superficial thrombophlebitis approximate one month ago.  Past Medical History:  Diagnosis Date  . Anxiety   . GERD (gastroesophageal reflux disease)   . Hypertension   . Palpitations   . PVC's (premature ventricular contractions)   . Shortness of breath   . Varicose veins     Family History  Problem Relation Age of Onset  . Cancer Mother     ?etiology  . Heart disease Mother   . Colon polyps Sister 67    two sisters  . Liver disease Neg Hx     SOCIAL HISTORY: Social History   Social History  . Marital status: Significant Other    Spouse name: N/A  . Number of children: 2  . Years of education: N/A   Occupational History  . [employer], clinical (histocompatibility and immunogenetics)   Social History Main Topics  . Smoking status: Current Every Day Smoker    Packs/day: 1.50    Years: 30.00    Types: Cigarettes    Start date: 12/24/1979  . Smokeless tobacco: Never Used  . Alcohol use 0.0 oz/week     Comment: consumes beer or liquor three to four times weekly. Up to six pack or three shots each occurence or more x 20+ yrs  . Drug use: No  . Sexual activity: Yes    Partners: Female    Birth control/ protection: None   Other Topics Concern  . Not on file   Social History Narrative   Lives alone  Girlfriend & kids 1/2 time    No Known Allergies  Current Outpatient Prescriptions  Medication Sig Dispense Refill  . acebutolol (SECTRAL) 400 MG capsule TAKE (1) CAPSULE BY MOUTH TWICE DAILY. 60 capsule 2  . allopurinol (ZYLOPRIM) 100 MG tablet take 1 tablet by mouth once daily 30 tablet 2  . colchicine 0.6 MG tablet TAKE 1 TABLET BY MOUTH TWICE DAILY. 24 tablet 0  . DEXILANT 60 MG capsule take 1 capsule by mouth once daily 30 capsule 5  . enalapril (VASOTEC) 10 MG tablet Take 1 tablet (10 mg total) by mouth daily. 30 tablet 11  . rivaroxaban (XARELTO) 20 MG TABS tablet TAKE 1 TABLET ONCE DAILY WITH EVENING MEAL. 30 tablet 5  . VIAGRA 100 MG tablet TAKE  1/2 TABLET ONCE DAILY. 3 tablet 0  . vitamin B-12 (CYANOCOBALAMIN) 1000 MCG tablet Take 1,000 mcg by mouth daily.    Marland Kitchen albuterol (PROVENTIL HFA;VENTOLIN HFA) 108 (90 Base) MCG/ACT inhaler Inhale 2 puffs into the lungs 4 (four) times daily as needed for wheezing or shortness of breath. (Patient not taking: Reported on 06/22/2016) 1 Inhaler 0  . cephALEXin (KEFLEX) 500 MG capsule Take 1 capsule (500 mg total) by mouth 3 (three) times daily. X 7 days (Patient not taking: Reported on 06/22/2016) 21 capsule 0  . etodolac (LODINE) 400 MG tablet take 1 tablet by mouth twice a day (Patient not taking: Reported on 06/22/2016) 28 tablet 2  . valACYclovir (VALTREX) 1000 MG tablet 2 tablets BID for 1 day (Patient not taking:  Reported on 05/25/2016) 4 tablet 6   No current facility-administered medications for this visit.     REVIEW OF SYSTEMS:  '[X]'$  denotes positive finding, '[ ]'$  denotes negative finding Cardiac  Comments:  Chest pain or chest pressure:    Shortness of breath upon exertion:    Short of breath when lying flat:    Irregular heart rhythm:        Vascular    Pain in calf, thigh, or hip brought on by ambulation: x   Pain in feet at night that wakes you up from your sleep:     Blood clot in your veins: x   Leg swelling:  x       Pulmonary    Oxygen at home:    Productive cough:     Wheezing:         Neurologic    Sudden weakness in arms or legs:     Sudden numbness in arms or legs:  x   Sudden onset of difficulty speaking or slurred speech:    Temporary loss of vision in one eye:     Problems with dizziness:         Gastrointestinal    Blood in stool:  x   Vomited blood:         Genitourinary    Burning when urinating:     Blood in urine:        Psychiatric    Major depression:         Hematologic    Bleeding problems:    Problems with blood clotting too easily:        Skin    Rashes or ulcers:        Constitutional    Fever or chills:      PHYSICAL EXAM: Vitals:   07/24/16 1118  BP: 122/77  Resp: 18  Temp: 97.8 F (36.6 C)  TempSrc: Oral  SpO2: 95%  Weight: 247 lb 8 oz (112.3 kg)  Height: 5' 11.25" (1.81 m)    GENERAL: The patient is a well-nourished male, in no acute distress. The vital signs are documented above. VASCULAR: 2+ dorsalis pedis pulses bilaterally PULMONARY: There is good air exchange  MUSCULOSKELETAL: There are no major deformities or cyanosis. NEUROLOGIC: No focal weakness or paresthesias are detected. SKIN: There are no ulcers or rashes noted. Does have changes in both lower extremities quickly from his calf distally onto his feet of telangiectasia suggestive of venous hypertension PSYCHIATRIC: The patient has a normal affect.  DATA:    He did undergo repeat venous duplex in our office with reflux evaluation. This does not show any evidence of DVT. Does have evidence of deep vein reflux in his common  femoral vein only. He does have reflux throughout his great saphenous vein bilaterally.   I imaged is a veins with SonoSite ultrasound this does show superficial clot in the varicosities in his medial left thigh. This arises directly off the saphenous vein.  MEDICAL ISSUES:  I discussed options with patient. He does have significant reflux in his great saphenous vein but is not having a great deal of symptoms related to this. I would recommend continued conservative treatment with elevation and compression only. He would be a candidate for laser ablation but is not having symptoms that would warrant this procedure currently. Explained that I do not feel that his main complaint of numbness and tingling in his lower extremities is related to his venous pathology. He is on anticoagulation with Xarelto currently. Explained that he does have a relative indication of this with his history of right popliteal DVT 1 year ago and recent episode of superficial thrombophlebitis in varicosities. His hypercoagulable workup apparently was negative. He does have a questionable family history for thrombotic disorders. He is to see hematologist for further evaluation and further judgment regarding duration of anticoagulation treatment. He was reassured with this discussion will see [place] again on as-needed basis   Bradley Miles [facility] [phone]

## 2016-08-08 ENCOUNTER — Telehealth: Payer: Self-pay | Admitting: Internal Medicine

## 2016-08-08 ENCOUNTER — Other Ambulatory Visit: Payer: Self-pay | Admitting: Family Medicine

## 2016-08-08 NOTE — Telephone Encounter (Signed)
Pt called to say his insurance doesn't cover dexilant anymore and we would need to do a PA and could he get some samples. He uses [facility] in [place]

## 2016-08-08 NOTE — Telephone Encounter (Signed)
Dr Mariane Duval please

## 2016-08-08 NOTE — Telephone Encounter (Signed)
Samples are at the front desk. 

## 2016-08-09 NOTE — Telephone Encounter (Signed)
Ok six mo 

## 2016-08-15 ENCOUNTER — Encounter (HOSPITAL_COMMUNITY): Payer: Self-pay | Admitting: Hematology

## 2016-08-15 ENCOUNTER — Encounter (HOSPITAL_COMMUNITY): Payer: BLUE CROSS/BLUE SHIELD | Attending: Hematology | Admitting: Hematology

## 2016-08-15 VITALS — BP 121/83 | HR 89 | Temp 98.1°F | Resp 17 | Ht 71.26 in | Wt 247.0 lb

## 2016-08-15 DIAGNOSIS — Z86718 Personal history of other venous thrombosis and embolism: Secondary | ICD-10-CM | POA: Diagnosis not present

## 2016-08-15 DIAGNOSIS — R209 Unspecified disturbances of skin sensation: Secondary | ICD-10-CM

## 2016-08-15 DIAGNOSIS — Z7901 Long term (current) use of anticoagulants: Secondary | ICD-10-CM

## 2016-08-15 DIAGNOSIS — F17210 Nicotine dependence, cigarettes, uncomplicated: Secondary | ICD-10-CM | POA: Diagnosis not present

## 2016-08-15 DIAGNOSIS — F1020 Alcohol dependence, uncomplicated: Secondary | ICD-10-CM | POA: Diagnosis not present

## 2016-08-15 DIAGNOSIS — I809 Phlebitis and thrombophlebitis of unspecified site: Secondary | ICD-10-CM

## 2016-08-15 DIAGNOSIS — I82401 Acute embolism and thrombosis of unspecified deep veins of right lower extremity: Secondary | ICD-10-CM

## 2016-08-15 NOTE — Progress Notes (Signed)
Marland Kitchen    HEMATOLOGY/ONCOLOGY CONSULTATION NOTE  Date of Service: 08/15/2016  Patient Care Team: Bradley Kirschner, MD as PCP - General (Family Medicine) Bradley Dolin, MD (Gastroenterology)  CHIEF COMPLAINTS/PURPOSE OF CONSULTATION:  DVT and recurrent superficial thrombophlebitis  HISTORY OF PRESENTING ILLNESS:   Bradley Miles is a wonderful 50 y.o. male who has been referred to [place] by Dr .Mickie Hillier, MD for evaluation and recommendation regarding anticoagulation for h/o DVT and recurrent superficial thrombophlebitis.  Patient notes that he has a history of anxiety, palpitations, gout , active nicotine abuse/smoker and pack per day, alcohol abuse (1 gallon of whisky every week). He has had worsening paresthesias and burning sensation in her his bilateral feet worsening over the last year. Had a  nerve conduction study and EMG in August 2016 that showed possible carpal tunnel syndrome but no evidence of large fiber neuropathy. 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 Patient is on B12 replacement.  Patient reports recurrent episodes of superficial thrombophlebitis/thrombosis within the multiple dilated varices in his lower extremities. Based on available results  ultrasound 07/01/2013- superficial acute venous thrombi are present in multiple dilated varices in the left calf region. No DVT Repeat ultrasound 07/15/2013- showed no clot extension into DVT.   ultrasound left lower extremity 03/08/2014 - no evidence of DVT. Stable-appearing superficial venous varicosities in the distal thigh as well as within the calf.   [place] 07/15/2015: DVT involving the distal popliteal vein extending into the right calf.  Patient was started on Xarelto to and completed Xarelto for 6 months.   [place] 05/25/2016: No evidence of left lower extremity DVT . Extensive  Left greater saphenous vein thrombus with both acute and chronic components. The thrombus approaches the saphenofemoral junction on the left.    Patient apparently had a venous reflux study that showed bilateral lower extremity venous reflux.  On 05/25/2016 the patient had labs for a hypercoagulability workup which was unrevealing. Factor V Leiden negative prothrombin gene mutation negative, protein C and protein S unrevealing, antiphospholipid antibodies unrevealing.  Patient notes that he is not very good with using compression socks. Still smoking in excess of 1 pack of cigarettes per day which we discussed was an active ongoing risk factor for recurrent VTE.  He notes that he had a couple of episodes of rectal bleeding over the last couple of months ranging from a little bit of blood on the tissue paper to about a couple of teaspoons of blood. No rectal urgency or tenesmus.  Patient had a colonoscopy in 2014 that showed internal hemorrhoids and an colonic diverticulosis.  He is drinking 1 gallon of whiskey weekly which would put him at high risk for bleeding with his anticoagulation. We discussed that this would not be a good idea with his ongoing use of Xarelto.  He notes his mother had a DVT but is unable to explain the circumstances and whether it was provoked. It appears that she eventually passed away from advanced cervical cancer.   PMHx:  Past Medical History:  Diagnosis Date  . Anxiety   . GERD (gastroesophageal reflux disease)   . Hypertension   . Palpitations   . PVC's (premature ventricular contractions)   . Shortness of breath   . Varicose veins   Gout - Allopurinol.  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 On B12. Burning sensation. LE >>UE. Previous NCS/EMG  in 07/2015-- noted to have CTS and no overt neuropathy --but symptoms much worse now.  SURGICAL HISTORY: Past Surgical History:  Procedure Laterality Date  . COLONOSCOPY WITH ESOPHAGOGASTRODUODENOSCOPY (EGD)   12/16/2012   Procedure: COLONOSCOPY WITH ESOPHAGOGASTRODUODENOSCOPY (EGD);  Surgeon: Bradley Dolin, MD;  Location: [facility];  Service: Endoscopy;  Laterality: N/A;  7:30  . cyst reomved     from spine  . ESOPHAGOGASTRODUODENOSCOPY  05/19/09   RMR: Geographic distal esophageal erosions consistent with severe erosive reflux esophagitis. schatzki's ring s/P dilation/small hiatal hernia otherwise normal stomach  . WISDOM TOOTH EXTRACTION      SOCIAL HISTORY: Social History   Social History  . Marital status: Significant Other    Spouse name: N/A  . Number of children: 2  . Years of education: N/A   Occupational History  . [employer], [employer]   Social History Main Topics  . Smoking status: Current Every Day Smoker    Packs/day: 1.50    Years: 30.00    Types: Cigarettes    Start date: 12/24/1979  . Smokeless tobacco: Never Used  . Alcohol use 0.0 oz/week     Comment: consumes beer or liquor three to four times weekly. Up to six pack or three shots each occurence or more x 20+ yrs  . Drug use: No  . Sexual activity: Yes    Partners: Female    Birth control/ protection: None   Other Topics Concern  . Not on file   Social History Narrative   Lives alone  Girlfriend & kids 1/2 time    FAMILY HISTORY: Family History  Problem Relation Age of Onset  . Cancer Mother     ?etiology  . Heart disease Mother   . Colon polyps Sister 95    two sisters  . Liver disease Neg Hx     ALLERGIES:  has No Known Allergies.  MEDICATIONS:  Current Outpatient Prescriptions  Medication Sig Dispense Refill  . acebutolol (SECTRAL) 400 MG capsule take 1 capsule by mouth twice a day 60 capsule 5  . allopurinol (ZYLOPRIM) 100 MG tablet take 1 tablet by mouth once daily 30 tablet 2  . colchicine 0.6 MG tablet TAKE 1 TABLET BY MOUTH TWICE DAILY. 24 tablet 0  . DEXILANT 60 MG capsule take 1 capsule by mouth once daily 30 capsule 5  . enalapril (VASOTEC) 10 MG tablet  Take 1 tablet (10 mg total) by mouth daily. 30 tablet 11  . rivaroxaban (XARELTO) 20 MG TABS tablet TAKE 1 TABLET ONCE DAILY WITH EVENING MEAL. 30 tablet 5  . valACYclovir (VALTREX) 1000 MG tablet 2 tablets BID for 1 day (Patient not taking: Reported on 05/25/2016) 4 tablet 6  . VIAGRA 100 MG tablet TAKE 1/2 TABLET ONCE DAILY. 3 tablet 0   No current facility-administered medications for this visit.     REVIEW OF SYSTEMS:    10 Point review of Systems was done is negative except as noted above.  PHYSICAL EXAMINATION: ECOG PERFORMANCE STATUS: 1 - Symptomatic but completely ambulatory  . Vitals:   08/15/16 1327  BP: 121/83  Pulse: 89  Resp: 17  Temp: 98.1 F (36.7 C)   Filed Weights   08/15/16 1327  Weight: 247 lb (112 kg)   .Body mass index is 34.2 kg/m.  GENERAL:alert, in no acute distress and comfortable SKIN: skin color, texture, turgor are normal, no rashes or significant lesions EYES: normal, conjunctiva are pink and non-injected, sclera  clear OROPHARYNX:no exudate, no erythema and lips, buccal mucosa, and tongue normal  NECK: supple, no JVD, thyroid normal size, non-tender, without nodularity LYMPH:  no palpable lymphadenopathy in the cervical, axillary or inguinal LUNGS: clear to auscultation with normal respiratory effort HEART: regular rate & rhythm,  no murmurs and no lower extremity edema ABDOMEN: abdomen soft, non-tender, normoactive bowel sounds  Musculoskeletal: no cyanosis of digits and no clubbing  PSYCH: alert & oriented x 3 with fluent speech NEURO: no focal motor/sensory deficits  LABORATORY DATA:  I have reviewed the data as listed  . CBC Latest Ref Rng & Units 06/22/2016 05/25/2016 12/27/2015  WBC 4.0 - 10.5 K/uL - 6.4 6.0  Hemoglobin 14.1 - 18.1 g/dL 16.8 16.6 -  Hematocrit 39.0 - 52.0 % - 47.0 48.2  Platelets 150 - 400 K/uL - 136(L) 161    . CMP Latest Ref Rng & Units 05/25/2016 12/27/2015 07/15/2015  Glucose 65 - 99 mg/dL 123(H) 135(H) -  BUN 6  - 20 mg/dL <5(L) 5(L) -  Creatinine 0.61 - 1.24 mg/dL 0.66 0.82 -  Sodium 135 - 145 mmol/L 138 138 -  Potassium 3.5 - 5.1 mmol/L 3.4(L) 4.0 -  Chloride 101 - 111 mmol/L 102 98 -  CO2 22 - 32 mmol/L 24 23 -  Calcium 8.9 - 10.3 mg/dL 8.6(L) 9.0 -  Total Protein 6.5 - 8.1 g/dL 6.9 6.4 7.0  Total Bilirubin 0.3 - 1.2 mg/dL 0.8 1.4(H) 0.7  Alkaline Phos 38 - 126 U/L 57 62 64  AST 15 - 41 U/L 65(H) 74(H) 44(H)  ALT 17 - 63 U/L 46 56(H) 47(H)   Component     Latest Ref Rng & Units 05/25/2016  PTT Lupus Anticoagulant     0.0 - 43.6 sec 39.8  DRVVT     0.0 - 47.0 sec 51.5 (H)  Lupus Anticoag Interp      Comment:  Beta-2 Glycoprotein I Ab, IgG     0 - 20 GPI IgG units <9  Beta-2-Glycoprotein I IgM     0 - 32 GPI IgM units <9  Beta-2-Glycoprotein I IgA     0 - 25 GPI IgA units <9  Anticardiolipin Ab,IgG,Qn     0 - 14 GPL U/mL <9  Anticardiolipin Ab,IgM,Qn     0 - 12 MPL U/mL <9  Anticardiolipin Ab,IgA,Qn     0 - 11 APL U/mL <9  Recommendations-F5LEID:      Comment  Comment      Comment  Recommendations-PTGENE:      Comment  Additional Information      Comment  Antithrombin Activity     75 - 120 % 81  Protein C-Functional     73 - 180 % 80  Protein C, Total     60 - 150 % 67  Protein S-Functional     63 - 140 % 211 (H)  Protein S, Total     60 - 150 % 113  Homocysteine     0.0 - 15.0 umol/L 32.8 (H)  D-Dimer, Quant     0.00 - 0.50 ug/mL-FEU 1.00 (H)  dRVVT Mix     0.0 - 47.0 sec 39.5   Factor 5 leiden      No reference range information available      Comments:           (NOTE)           Result:  Negative (no mutation found)  Prothrombin gene mutation  No reference range information available      Comments:           (NOTE)           NEGATIVE           No mutation identified.   RADIOGRAPHIC STUDIES: I have personally reviewed the radiological images as listed and agreed with the findings in the report. No results found.  ASSESSMENT & PLAN:    50 year old Caucasian male with   #1 1 episode of distal popliteal and right DVT in August 2016 - multiple provoking factors including active smoking, chronic venous reflux with significant venous varicosities in bilateral lower extremities. This was treated with anticoagulation for 6 months. Hypercoagulable workup in June 2017 unrevealing. No definitive family history of hereditary hypercoagulable disorder.  #2 significant venous varicosities with chronic venous reflux bilateral lower extremities - with recurrent superficial, phlebitis. At high risk left greater saphenous Acute and chronic thrombosis/thrombo-phlebitis close to the saphenofemoral junction in June 2017. Has been restarted on Xarelto since then.  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 PLAN -Given obvious ongoing trigger factors for recurrent superficial thrombophlebitis and DVT cannot call his previous DVT and unprovoked event.  -He was strongly recommended to address his ongoing risk factors including smoking, dehydration related to alcohol abuse, varicose veins with chronic venous reflux. -Discussed that his previous DVT would increase his risk of recurrent acute and DVT. -His significant alcohol abuse increases his risk of bleeding with therapeutic anticoagulation. -Given his hypercoagulable workup is negative and he is at high risk of recurrent bleeding with alcohol abuse - would consider discontinuation of his therapeutic anticoagulation after 3 months of treatment for his high-risk superficial thrombosis. If no issues with bleeding might consider starting the patient on a baby aspirin for continued prophylaxis against recurrent thrombophlebitis. -Would recommend arterial lower extremity US with ABI's to rule out PAD. -If PAD is ruled out could use knee high Jobst stockings 20/60m  Hg -Consider treatment for venous varicosities after completion of therapeutic anticoagulation to reduce the risk of recurrent thrombophlebitis. -Extensively counseled them on smoking cessation -Continue follow-up with primary care physician and vascular surgery. -The patient is unable to address his risk factors and continues to have recurrent superficial or deep venous  Thrombosis -would need to discuss the risks versus benefits of ongoing anticoagulation specific to his personal situation with his primary care physician. -Age-appropriate cancer screening with PCP.  Plz reconsult if any additional questions or concerns.  All of the patients questions were answered with apparent satisfaction. The patient knows to call the clinic with any problems, questions or concerns.  I spent 60 minutes counseling the patient face to face. The total time spent in the appointment was 60 minutes and more than 50% was on counseling and direct patient cares.    GSullivan LoneMD MSunsetAAHIVMS SCec Surgical Services LLCCWarm Springs Rehabilitation Hospital Of Thousand OaksHematology/Oncology Physician CMid[facility] (Office):       [phone](Work cell):  [phone](Fax):           [phone] 08/15/2016 1:57 PM

## 2016-08-15 NOTE — Patient Instructions (Addendum)
[place] at [facility]  Discharge Instructions:  Seen by MD Irene Limbo today.  _______________________________________________________________  Thank you for choosing [facility] to provide your oncology and hematology care.  To afford each patient quality time with our providers, please arrive at least 15 minutes before your scheduled appointment.  You need to re-schedule your appointment if you arrive 10 or more minutes late.  We strive to give you quality time with our providers, and arriving late affects you and other patients whose appointments are after yours.  Also, if you no show three or more times for appointments you may be dismissed from the clinic.  Again, thank you for choosing [facility] hope is that these requests will allow you access to exceptional care and in a timely manner. _______________________________________________________________  If you have questions after your visit, please contact our office at [phone] between the hours of 8:30 a.m. and 5:00 p.m. Voicemails left after 4:30 p.m. will not be returned until the following business day. _______________________________________________________________  For prescription refill requests, have your pharmacy contact our office. _______________________________________________________________  Recommendations made by the consultant and any test results will be sent to your referring physician. _______________________________________________________________

## 2016-08-24 ENCOUNTER — Telehealth: Payer: Self-pay | Admitting: Gastroenterology

## 2016-08-24 NOTE — Telephone Encounter (Signed)
Called patient TO DISCUSS CONCERNS. LVM-CALL [phone] TO DISCUSS.Marland Kitchen PT IMMEDIATELY RETURNED CALLED. GI PROVIDER IS ANNA SAMS. INSURANCE IS REQUESTING A PA FOR DEXILANT. WILL SEND TO [facility]. PT NEED OPV DX: GERD. HASN'T BEEN SEEN SINCE 2014. WILL LEAVE DEXILANT #15 AT ED REGISTRATION FOR PT TO PICK UP TODAY. HE SAYS HE CAN'T DO WITHOUT THE MEDS.

## 2016-08-27 ENCOUNTER — Encounter: Payer: Self-pay | Admitting: Internal Medicine

## 2016-08-27 NOTE — Telephone Encounter (Signed)
Working on PA

## 2016-08-27 NOTE — Telephone Encounter (Signed)
APPOINTMENT MADE AND LETTER SENT °

## 2016-09-25 ENCOUNTER — Ambulatory Visit: Payer: BLUE CROSS/BLUE SHIELD | Admitting: Gastroenterology

## 2016-10-08 ENCOUNTER — Other Ambulatory Visit: Payer: Self-pay | Admitting: Family Medicine

## 2016-10-08 ENCOUNTER — Other Ambulatory Visit: Payer: Self-pay | Admitting: Gastroenterology

## 2016-10-16 ENCOUNTER — Ambulatory Visit: Payer: BLUE CROSS/BLUE SHIELD | Admitting: Gastroenterology

## 2016-10-16 ENCOUNTER — Encounter: Payer: Self-pay | Admitting: Gastroenterology

## 2016-10-16 ENCOUNTER — Telehealth: Payer: Self-pay | Admitting: Gastroenterology

## 2016-10-16 NOTE — Telephone Encounter (Signed)
PT WAS A NO SHOW AND LETTER SENT  °

## 2016-11-14 ENCOUNTER — Other Ambulatory Visit: Payer: Self-pay | Admitting: Family Medicine

## 2016-11-19 ENCOUNTER — Other Ambulatory Visit: Payer: Self-pay | Admitting: *Deleted

## 2016-11-19 MED ORDER — SILDENAFIL CITRATE 100 MG PO TABS: 50.0000 mg | ORAL_TABLET | Freq: Every day | ORAL | 6 refills | Status: DC

## 2016-12-06 ENCOUNTER — Other Ambulatory Visit: Payer: Self-pay | Admitting: Family Medicine

## 2016-12-07 NOTE — Telephone Encounter (Signed)
One refill each needs office visit

## 2016-12-28 ENCOUNTER — Ambulatory Visit: Payer: BLUE CROSS/BLUE SHIELD | Admitting: Family Medicine

## 2017-01-03 ENCOUNTER — Telehealth: Payer: Self-pay | Admitting: Family Medicine

## 2017-01-03 NOTE — Telephone Encounter (Signed)
Yes they can ive my name

## 2017-01-03 NOTE — Telephone Encounter (Signed)
Pt is seeking treatment at [facility] for his alcoholism  His employer needs a doctor's name for his short term disability so that the patient can use those benefits while in treatment  Pt's asking if it is OK to give them Dr. Jeannine Kitten name since that is his PCP and he's aware of pt's problem and has strongly suggested in the past that pt seek treatment  Please advise (pt needs a call back today so that he can secure his short term disability benefits and get in to treatment Friday or Saturday morning)  [name] - [phone]

## 2017-01-03 NOTE — Telephone Encounter (Signed)
Called and let [name] know that Dr. Richardson Landry said ok

## 2017-01-07 ENCOUNTER — Ambulatory Visit: Payer: BLUE CROSS/BLUE SHIELD | Admitting: Family Medicine

## 2017-01-07 ENCOUNTER — Telehealth: Payer: Self-pay | Admitting: Family Medicine

## 2017-01-07 NOTE — Telephone Encounter (Signed)
Patient disability paper are in your box to fill out. I spoke with Bradley Miles who sent you recent message these papers will have to be filled out by physician.Patient hasnt had recent visit so complete form has to be filled out where highlighted areas are.

## 2017-01-07 NOTE — Telephone Encounter (Signed)
Pt is currently inpatient receiving treatment for his alcoholism  We will be getting short term disability forms from [employer] [facility] [employer]  Please watch for them so we can complete & send in so that pt can continue to be paid during his time in treatment  Dr. Richardson Landry agreed to this the other day, see previous phone message from 01/03/17  Questions please call [name] [phone]

## 2017-01-10 NOTE — Telephone Encounter (Signed)
Please call pt, I think he originally had appt on Monday so we may have cancelled that ourselves due to circumstances. This form requires a full utd mental stautus exam to tell the folks for claims for m h disability, this will require an utd visit and disc and assessment to fill out, offer appt to pt tomorrow, eve if same day only visit

## 2017-01-10 NOTE — Telephone Encounter (Signed)
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 Generally these are filled out by the attending physician providing the in patient care for a pt   I have no idea of date of disability. Expected date of return, rx, prognosis etc

## 2017-01-11 NOTE — Telephone Encounter (Signed)
Called Bradley Miles & explained that this form will need to be completed by the physician at [facility].  Explained that there were details we simply couldn't answer without a face to face visit Bradley Miles thanked [place] for our help and asked that I forward the form to her and she will get it taken care of

## 2017-01-11 NOTE — Telephone Encounter (Signed)
Form rec'd, we're unable to complete due to info requested, spoke with [name], will forward form to her & she will get it taken care of

## 2017-01-15 ENCOUNTER — Encounter (HOSPITAL_COMMUNITY): Payer: Self-pay | Admitting: *Deleted

## 2017-01-15 ENCOUNTER — Emergency Department (HOSPITAL_COMMUNITY): Payer: BLUE CROSS/BLUE SHIELD

## 2017-01-15 ENCOUNTER — Emergency Department (HOSPITAL_COMMUNITY)
Admission: EM | Admit: 2017-01-15 | Discharge: 2017-01-15 | Disposition: A | Discharge status: Home / Self Care | Payer: BLUE CROSS/BLUE SHIELD | Attending: Emergency Medicine | Admitting: Emergency Medicine

## 2017-01-15 DIAGNOSIS — Z79899 Other long term (current) drug therapy: Secondary | ICD-10-CM | POA: Diagnosis not present

## 2017-01-15 DIAGNOSIS — I10 Essential (primary) hypertension: Secondary | ICD-10-CM | POA: Insufficient documentation

## 2017-01-15 DIAGNOSIS — J40 Bronchitis, not specified as acute or chronic: Secondary | ICD-10-CM | POA: Diagnosis not present

## 2017-01-15 DIAGNOSIS — F17210 Nicotine dependence, cigarettes, uncomplicated: Secondary | ICD-10-CM | POA: Insufficient documentation

## 2017-01-15 DIAGNOSIS — R05 Cough: Secondary | ICD-10-CM | POA: Diagnosis present

## 2017-01-15 MED ORDER — BENZONATATE 100 MG PO CAPS: 100.0000 mg | ORAL_CAPSULE | Freq: Three times a day (TID) | ORAL | 0 refills | Status: DC

## 2017-01-15 MED ORDER — ALBUTEROL SULFATE HFA 108 (90 BASE) MCG/ACT IN AERS
2.0000 | INHALATION_SPRAY | Freq: Once | RESPIRATORY_TRACT | Status: AC
  Administered 2017-01-15: 2 via RESPIRATORY_TRACT
  Filled 2017-01-15: qty 6.7

## 2017-01-15 MED ORDER — AZITHROMYCIN 250 MG PO TABS: ORAL_TABLET | ORAL | 0 refills | Status: DC

## 2017-01-15 MED ORDER — AZITHROMYCIN 250 MG PO TABS
500.0000 mg | ORAL_TABLET | Freq: Once | ORAL | Status: AC
  Administered 2017-01-15: 500 mg via ORAL
  Filled 2017-01-15: qty 2

## 2017-01-15 NOTE — ED Triage Notes (Addendum)
Per EMS pt coming from [facility] for cough and fever. Per EMS they wanted the pt to be checked for pneumonia and flu, although he was tested for flu and strep throat at the [facility] and it was negative. Pt sts he is here against his will and just wants to goet antibiotics and go back.

## 2017-01-15 NOTE — Discharge Instructions (Signed)
Chest xray consistent with acute bronchitis. Already received '500mg'$  of zithromax in ED. Home with '250mg'$  for 4 more days daily. Inhaler 2 puffs ever 4 hrs for cough and wheezing. Tessalon for cough as needed. Follow up with family doctor if not improving.

## 2017-01-15 NOTE — ED Notes (Signed)
Unable to scan medications due to computer shutting down.

## 2017-01-15 NOTE — ED Notes (Signed)
Bed: FX25 Expected date:  Expected time:  Means of arrival:  Comments: Ems flu

## 2017-01-15 NOTE — ED Provider Notes (Signed)
[facility] DEPT Provider Note   CSN: [id number] Arrival date & time: 01/15/17  4098     History   Chief Complaint Chief Complaint  Patient presents with  . flu like symptoms    HPI Bradley Miles is a 51 y.o. male.  HPI Bradley Miles is a 51 y.o. male with hx of COD, DVT, GERD, presents To emergency department with cough, congestion, sore throat, fever up to 101.5, symptoms for 3 days. He's coming from [facility] which is a alcohol detox facility. He states that he has been alcohol free for 15 days. He apparently was evaluated at the facility and had negative influenza and strep screens. He was sent here for further evaluation of his cough and fever. Last fever this morning, 101.5. Was given tylenol. Denies neck pain or stiffness. No nausea, vomiting. No diarrhea. No chest or abdominal pain. States that he is blowing dark green mucus from his nose and coughing up brown thick sputum as well.  Past Medical History:  Diagnosis Date  . Anxiety   . GERD (gastroesophageal reflux disease)   . Hypertension   . Palpitations   . PVC's (premature ventricular contractions)   . Shortness of breath   . Varicose veins     Patient Active Problem List   Diagnosis Date Noted  . Chest pain 12/21/2015  . Right leg DVT ([place]) 07/18/2015  . Elevated transaminase measurement 07/18/2015  . Sensory neuropathy ([place]) 07/16/2015  . Anxiety 12/23/2014  . EtOH dependence ([place]) 02/09/2014  . Headache in back of head 01/12/2014  . Superficial thrombosis of lower extremity 07/01/2013  . Hematochezia 11/17/2012  . Mixed hyperlipidemia 02/15/2012  . Hypertension 01/18/2012  . Insomnia 01/18/2012  . Family history of colonic polyps 11/15/2011  . Palpitations 10/04/2011  . TOBACCO ABUSE 05/18/2010  . GERD 05/18/2010  . CONSTIPATION 05/18/2010  . 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 DYSPHAGIA UNSPECIFIED 05/18/2010  . ABDOMINAL PAIN, RIGHT LOWER QUADRANT 05/18/2010    Past Surgical  History:  Procedure Laterality Date  . COLONOSCOPY WITH ESOPHAGOGASTRODUODENOSCOPY (EGD)  12/16/2012   Procedure: COLONOSCOPY WITH ESOPHAGOGASTRODUODENOSCOPY (EGD);  Surgeon: Daneil Dolin, MD;  Location: [facility];  Service: Endoscopy;  Laterality: N/A;  7:30  . cyst reomved     from spine  . ESOPHAGOGASTRODUODENOSCOPY  05/19/09   RMR: Geographic distal esophageal erosions consistent with severe erosive reflux esophagitis. schatzki's ring s/P dilation/small hiatal hernia otherwise normal stomach  . WISDOM TOOTH EXTRACTION         Home Medications    Prior to Admission medications   Medication Sig Start Date End Date Taking? Authorizing Provider  acebutolol (SECTRAL) 400 MG capsule take 1 capsule by mouth twice a day 08/09/16  Yes [name], MD  colchicine 0.6 MG tablet take 1 tablet once daily 12/07/16  Yes Kathyrn Drown, MD  DEXILANT 60 MG capsule take 1 capsule by mouth once daily 10/09/16  Yes Carlis Stable, NP  DiazePAM 10 MG/2ML SOAJ Inject 10 mg into the muscle once as needed (For seizures.). Notify MD.   Yes Historical Provider, MD  enalapril (VASOTEC) 10 MG tablet Take 1 tablet (10 mg total) by mouth daily. 01/23/16  Yes Herminio Commons, MD  gabapentin (NEURONTIN) 300 MG capsule Take 300 mg by mouth 3 (three) times daily.   Yes Historical Provider, MD  ibuprofen (ADVIL,MOTRIN) 200 MG tablet Take 600 mg by mouth every 6 (six) hours as needed for headache.    Yes Historical  Provider, MD  magnesium oxide (MAG-OX) 400 MG tablet Take 400 mg by mouth 2 (two) times daily.   Yes Historical Provider, MD  Omega-3 Fatty Acids (FISH OIL PO) Take 3 capsules by mouth daily.   Yes Historical Provider, MD  OVER THE COUNTER MEDICATION Take 1 tablet by mouth daily. Multivitamin with Folic Acid   Yes Historical Provider, MD  PRESCRIPTION MEDICATION Take 1 tablet by mouth 3 (three) times daily. Phenylephrine '10mg'$    Yes Historical Provider, MD  sildenafil (VIAGRA) 100 MG tablet Take 0.5  tablets (50 mg total) by mouth daily. Patient taking differently: Take 50 mg by mouth as needed for erectile dysfunction.  11/19/16  Yes [name], MD  thiamine 100 MG tablet Take 100 mg by mouth daily.   Yes Historical Provider, MD  valACYclovir (VALTREX) 1000 MG tablet 2 tablets BID for 1 day Patient taking differently: Take 1,000 mg by mouth 2 (two) times daily as needed (For fever blisters.).  03/15/16  Yes [name], MD  vitamin B-12 (CYANOCOBALAMIN) 1000 MCG tablet Take 1,000 mcg by mouth daily.   Yes Historical Provider, MD    Family History Family History  Problem Relation Age of Onset  . Cancer Mother     ?etiology  . Heart disease Mother   . Colon polyps Sister 69    two sisters  . Liver disease Neg Hx     Social History Social History  Substance Use Topics  . Smoking status: Current Every Day Smoker    Packs/day: 1.50    Years: 30.00    Types: Cigarettes    Start date: 12/24/1979  . Smokeless tobacco: Never Used  . Alcohol use 0.0 oz/week     Comment: consumes beer or liquor three to four times weekly. Up to six pack or three shots each occurence or more x 20+ yrs     Allergies   Patient has no known allergies.   Review of Systems Review of Systems  Constitutional: Positive for chills and fever.  HENT: Positive for congestion and sore throat.   Respiratory: Positive for cough. Negative for chest tightness and shortness of breath.   Cardiovascular: Negative for chest pain, palpitations and leg swelling.  Gastrointestinal: Negative for abdominal distention, abdominal pain, diarrhea, nausea and vomiting.  Genitourinary: Negative for dysuria, frequency, hematuria and urgency.  Musculoskeletal: Negative for arthralgias, myalgias, neck pain and neck stiffness.  Skin: Negative for rash.  Allergic/Immunologic: Negative for immunocompromised state.  Neurological: Negative for dizziness, weakness, light-headedness, numbness and headaches.  All other  systems reviewed and are negative.    Physical Exam Updated Vital Signs BP 106/72   Pulse 92   Temp 98.5 F (36.9 C)   Resp 16   SpO2 96%   Physical Exam  Constitutional: He is oriented to person, place, and time. He appears well-developed and well-nourished. No distress.  HENT:  Head: Normocephalic and atraumatic.  Right Ear: External ear normal.  Left Ear: External ear normal.  Mouth/Throat: Oropharynx is clear and moist.  Clear rhinorrhea, pharynx erythemous, uvula midline  Eyes: Conjunctivae are normal.  Neck: Normal range of motion. Neck supple.  No meningeal signs  Cardiovascular: Normal rate, regular rhythm and normal heart sounds.   Pulmonary/Chest: Effort normal and breath sounds normal. No respiratory distress. He has no wheezes. He has no rales.  Abdominal: Soft. Bowel sounds are normal. There is no tenderness.  Musculoskeletal: He exhibits no edema or tenderness.  Lymphadenopathy:    He has no cervical  adenopathy.  Neurological: He is alert and oriented to person, place, and time.  Skin: Skin is warm and dry. No erythema.  Psychiatric: He has a normal mood and affect.  Nursing note and vitals reviewed.    ED Treatments / Results  Labs (all labs ordered are listed, but only abnormal results are displayed) Labs Reviewed - No data to display  EKG  EKG Interpretation None       Radiology Dg Chest 2 View  Result Date: 01/15/2017 CLINICAL DATA:  Cough and chest congestion for the past 5 days with cough now being productive. Onset of fever today. Long history of smoking. EXAM: CHEST  2 VIEW COMPARISON:  Chest x-ray of July 19, 2012. FINDINGS: The lungs are mildly hyperinflated. There is no focal infiltrate. The interstitial markings are coarse but this is not a new finding. There is no pleural effusion or pneumothorax. The heart and pulmonary vascularity are normal. There is calcification in the wall of the aortic arch. The bony thorax exhibits no acute  abnormality. IMPRESSION: There is no acute pneumonia. Mild hyperinflation may be voluntary or may reflect acute bronchitis with mild air trapping. Thoracic aortic atherosclerosis. Electronically Signed   By: David  Martinique M.D.   On: 01/15/2017 09:26    Procedures Procedures (including critical care time)  Medications Ordered in ED Medications  azithromycin (ZITHROMAX) tablet 500 mg (not administered)  albuterol (PROVENTIL HFA;VENTOLIN HFA) 108 (90 Base) MCG/ACT inhaler 2 puff (not administered)     Initial Impression / Assessment and Plan / ED Course  I have reviewed the triage vital signs and the nursing notes.  Pertinent labs & imaging results that were available during my care of the patient were reviewed by me and considered in my medical decision making (see chart for details).     Pt with COPD, current smoker, here with URI symptoms. Due to increase thickened and colored sputum as well as chest pain wit cough, will cover with antibiotics. Pt's cxr showing acute bronchitis with air trapping. tx with inhaler, z-pack, tessalon. Tylenol/motrin for body aches and fever. VS stable for dc home.   Vitals:   01/15/17 0857 01/15/17 0900  BP:  106/72  Pulse:  92  Resp:  16  Temp:  98.5 F (36.9 C)  SpO2: 95% 96%     Final Clinical Impressions(s) / ED Diagnoses   Final diagnoses:  Bronchitis    New Prescriptions Discharge Medication List as of 01/15/2017  9:58 AM    START taking these medications   Details  azithromycin (ZITHROMAX) 250 MG tablet Take one tab PO daily, Print    benzonatate (TESSALON) 100 MG capsule Take 1 capsule (100 mg total) by mouth every 8 (eight) hours., Starting Tue 01/15/2017, Print         Jeannett Senior, PA-C 01/15/17 [place], MD 01/19/17 [phone]

## 2017-01-17 ENCOUNTER — Ambulatory Visit (INDEPENDENT_AMBULATORY_CARE_PROVIDER_SITE_OTHER): Payer: BLUE CROSS/BLUE SHIELD | Admitting: Family Medicine

## 2017-01-17 ENCOUNTER — Encounter: Payer: Self-pay | Admitting: Family Medicine

## 2017-01-17 VITALS — BP 112/72 | Temp 98.2°F | Ht 71.25 in | Wt 247.4 lb

## 2017-01-17 DIAGNOSIS — J329 Chronic sinusitis, unspecified: Secondary | ICD-10-CM | POA: Diagnosis not present

## 2017-01-17 DIAGNOSIS — J310 Chronic rhinitis: Secondary | ICD-10-CM

## 2017-01-17 DIAGNOSIS — F10282 Alcohol dependence with alcohol-induced sleep disorder: Secondary | ICD-10-CM | POA: Diagnosis not present

## 2017-01-17 NOTE — Progress Notes (Signed)
   Subjective:    Patient ID: Bradley Miles, male    DOB: 03-Dec-1966, 51 y.o.   MRN: [id number]  HPI Patient arrives for a follow up from a recent hospitalization for bronchitis.  [facility] up [facility] d [facility]  tmax 101.5 first day  Oxygen a bit low, 88 to 90  Oxygen dropped even further   [place] overall better  Has taken abx   Sob at times, no enery, appetite still good   gined some weight with    Long-standing history of smoking. With this particular flare patient's oxygen dropped. Reports shortness of breath with exertion. Notes overall breathing has improved. Suggestion of hyperinflation on chest x-ray suggests potential COPD. Discussed at length.  Review of Systems No headache, no major weight loss or weight gain, no chest pain no back pain abdominal pain no change in bowel habits complete ROS otherwise negative     Objective:   Physical Exam  Alert active hydration good H&T moderate nasal congestion pharynx normal lungs bilateral rare wheeze bronchial cough intermittently      Assessment & Plan:  Impression post flu rhinosinusitis/bronchitis plan antibiotics to maintain. Tessalon when necessary for cough. Symptom care discussed warning signs discussed encourage to stop smoking. #2 chronic lung damage and hypoxemia with acute illness clinically improving still likely has domino COPD. Strongly encouraged smoking. Also recommend consideration towards pulmonary workup when he gets out of the [facility]. 25 minutes spent greater than 50% in counseling and discussion of complicated illness. Encouraged to stick with efforts at alcohol cessation., Currently working with [facility]

## 2017-01-17 NOTE — Patient Instructions (Signed)
01/17/2017  As of today from a pulmonary and medical standpoint, Bradley Miles is definitely medically stable to return to resident status at [facility].  Despite the initial flu test that was negative, flu may have been a part of things, however this many days after initial onset Bradley Miles will not be infectious to fellow residents or staff  I have encourage Bradley Miles to keep working on cutting down smoking as much as possible.  Call if any questions.  Margaretmary Eddy MD

## 2017-02-04 ENCOUNTER — Encounter: Payer: Self-pay | Admitting: Family Medicine

## 2017-02-04 ENCOUNTER — Ambulatory Visit (INDEPENDENT_AMBULATORY_CARE_PROVIDER_SITE_OTHER): Payer: BLUE CROSS/BLUE SHIELD | Admitting: Family Medicine

## 2017-02-04 VITALS — BP 114/70 | Ht 71.25 in | Wt 252.4 lb

## 2017-02-04 DIAGNOSIS — I872 Venous insufficiency (chronic) (peripheral): Secondary | ICD-10-CM | POA: Diagnosis not present

## 2017-02-04 DIAGNOSIS — R05 Cough: Secondary | ICD-10-CM

## 2017-02-04 DIAGNOSIS — R7303 Prediabetes: Secondary | ICD-10-CM

## 2017-02-04 DIAGNOSIS — R059 Cough, unspecified: Secondary | ICD-10-CM

## 2017-02-04 DIAGNOSIS — I739 Peripheral vascular disease, unspecified: Secondary | ICD-10-CM | POA: Diagnosis not present

## 2017-02-04 DIAGNOSIS — F10282 Alcohol dependence with alcohol-induced sleep disorder: Secondary | ICD-10-CM | POA: Diagnosis not present

## 2017-02-04 DIAGNOSIS — G629 Polyneuropathy, unspecified: Secondary | ICD-10-CM

## 2017-02-04 MED ORDER — GABAPENTIN 300 MG PO CAPS: 300.0000 mg | ORAL_CAPSULE | Freq: Four times a day (QID) | ORAL | 5 refills | Status: DC

## 2017-02-04 NOTE — Progress Notes (Signed)
   Subjective:    Patient ID: Bradley Miles, male    DOB: 02-Jan-1966, 51 y.o.   MRN: [id number] Patient arrives office for numerous concerns. HPI Patient in today for 2 week follow up for alcohol dependence.   Breathing overall is better, still gts short winded at times. Unfortunately smokes considerably. Prior x-rays suggested element of COPD.  Inhaler helps but almost out, fair amnt of coughing at night  States no other concerns this visit.   Just left yesterday fro the [facility], set up for ongoing counseling with alcohol abuse center. Also will be participating in [employer].  Patient notes right leg calf pain with exertion. After walking 100 yards or so. Has to stop. Strong family history of peripheral arterial disease. An ongoing smoking    While in. Pt drank a lot of orange juice     Review of Systems No headache, no major weight loss or weight gain, no chest pain no back pain abdominal pain no change in bowel habits complete ROS otherwise negative     Objective:   Physical Exam Alert vitals stable, NAD. Blood pressure good on repeat. HEENT normal. LungsNo wheezes diffuse diminishment of breath sounds otherwise clear. Heart regular rate and rhythm. Ankles and feet chronic venous stasis changes right arterial pulses diminished       Assessment & Plan:  Impression 1 right leg claudication discussed need to assess arterial status #2 chronic smoking with COPD like evidence on x-ray and pulmonary compromise with exertion discussed strongly encouraged to quit smoking #3 alcohol dependence with recent admission to the hospital ongoing craving. Just out for the past 2 days from the facility. #4 glucose intolerance/prediabetes discussed: Dietary recommendations #5 chronic painful neuropathy legs and feet. Felt to be due to alcohol abuse. Of note H-68 and folic acid good and patient states on supplement. plan [place] function tests. Arterial studies. Chronic medications  refilled. Increase Neurontin to 300 4 times a day because it helps with a painful neuropathy

## 2017-02-08 ENCOUNTER — Ambulatory Visit (HOSPITAL_COMMUNITY)
Admission: RE | Admit: 2017-02-08 | Discharge: 2017-02-08 | Disposition: A | Discharge status: Home / Self Care | Payer: BLUE CROSS/BLUE SHIELD | Source: Ambulatory Visit | Attending: Family Medicine | Admitting: Family Medicine

## 2017-02-08 DIAGNOSIS — I739 Peripheral vascular disease, unspecified: Secondary | ICD-10-CM | POA: Insufficient documentation

## 2017-02-11 ENCOUNTER — Encounter: Payer: Self-pay | Admitting: Family Medicine

## 2017-02-11 NOTE — Addendum Note (Signed)
Addended by: Launa Grill on: 02/11/2017 09:32 AM   Modules accepted: Orders

## 2017-02-13 ENCOUNTER — Ambulatory Visit (HOSPITAL_COMMUNITY)
Admission: RE | Admit: 2017-02-13 | Discharge: 2017-02-13 | Disposition: A | Discharge status: Home / Self Care | Payer: BLUE CROSS/BLUE SHIELD | Source: Ambulatory Visit | Attending: Family Medicine | Admitting: Family Medicine

## 2017-02-13 DIAGNOSIS — F17210 Nicotine dependence, cigarettes, uncomplicated: Secondary | ICD-10-CM | POA: Insufficient documentation

## 2017-02-13 DIAGNOSIS — R05 Cough: Secondary | ICD-10-CM | POA: Insufficient documentation

## 2017-02-13 DIAGNOSIS — R942 Abnormal results of pulmonary function studies: Secondary | ICD-10-CM | POA: Insufficient documentation

## 2017-02-13 LAB — PULMONARY FUNCTION TEST
FEF 25-75 Post: 3.52 L/sec
FEF 25-75 Pre: 2.32 L/sec
FEF2575-%CHANGE-POST: 51 %
FEF2575-%Pred-Post: 91 %
FEF2575-%Pred-Pre: 60 %
FEV1-%CHANGE-POST: 12 %
FEV1-%PRED-PRE: 71 %
FEV1-%Pred-Post: 81 %
FEV1-PRE: 3.14 L
FEV1-Post: 3.54 L
FEV1FVC-%CHANGE-POST: 6 %
FEV1FVC-%Pred-Pre: 93 %
FEV6-%Change-Post: 6 %
FEV6-%PRED-PRE: 78 %
FEV6-%Pred-Post: 83 %
FEV6-POST: 4.56 L
FEV6-PRE: 4.29 L
FEV6FVC-%Change-Post: 0 %
FEV6FVC-%PRED-POST: 103 %
FEV6FVC-%Pred-Pre: 103 %
FVC-%Change-Post: 6 %
FVC-%PRED-POST: 81 %
FVC-%PRED-PRE: 76 %
FVC-POST: 4.56 L
FVC-PRE: 4.29 L
POST FEV6/FVC RATIO: 100 %
PRE FEV6/FVC RATIO: 100 %
Post FEV1/FVC ratio: 78 %
Pre FEV1/FVC ratio: 73 %

## 2017-02-13 MED ORDER — ALBUTEROL SULFATE (2.5 MG/3ML) 0.083% IN NEBU
2.5000 mg | INHALATION_SOLUTION | Freq: Once | RESPIRATORY_TRACT | Status: AC
  Administered 2017-02-13: 2.5 mg via RESPIRATORY_TRACT

## 2017-02-23 ENCOUNTER — Other Ambulatory Visit: Payer: Self-pay | Admitting: Family Medicine

## 2017-02-23 ENCOUNTER — Other Ambulatory Visit: Payer: Self-pay | Admitting: Cardiovascular Disease

## 2017-02-25 ENCOUNTER — Other Ambulatory Visit: Payer: Self-pay

## 2017-02-25 MED ORDER — ENALAPRIL MALEATE 10 MG PO TABS: 10.0000 mg | ORAL_TABLET | Freq: Every day | ORAL | 11 refills | Status: DC

## 2017-03-19 ENCOUNTER — Encounter (HOSPITAL_COMMUNITY): Payer: BLUE CROSS/BLUE SHIELD

## 2017-04-04 ENCOUNTER — Encounter: Payer: Self-pay | Admitting: Vascular Surgery

## 2017-04-07 ENCOUNTER — Other Ambulatory Visit: Payer: Self-pay | Admitting: Family Medicine

## 2017-04-07 ENCOUNTER — Other Ambulatory Visit: Payer: Self-pay | Admitting: Nurse Practitioner

## 2017-04-08 NOTE — Telephone Encounter (Signed)
Patient last seen in 2014. He was given refills of PPI 2017 and OV which he no showed.  No further refills without OV first.   HE CAN REQUEST REFILLS FROM HIS PCP.

## 2017-04-08 NOTE — Telephone Encounter (Signed)
Pt is aware to get from PCP. He will call if he needs an appt.

## 2017-04-09 ENCOUNTER — Encounter (HOSPITAL_COMMUNITY): Payer: BLUE CROSS/BLUE SHIELD

## 2017-04-09 ENCOUNTER — Ambulatory Visit: Payer: BLUE CROSS/BLUE SHIELD | Admitting: Vascular Surgery

## 2017-04-10 ENCOUNTER — Ambulatory Visit (HOSPITAL_COMMUNITY): Admission: RE | Admit: 2017-04-10 | Payer: BLUE CROSS/BLUE SHIELD | Source: Ambulatory Visit

## 2017-04-10 ENCOUNTER — Other Ambulatory Visit: Payer: Self-pay | Admitting: Vascular Surgery

## 2017-04-10 ENCOUNTER — Encounter (HOSPITAL_COMMUNITY): Payer: Self-pay

## 2017-04-10 ENCOUNTER — Ambulatory Visit (HOSPITAL_COMMUNITY)
Admission: RE | Admit: 2017-04-10 | Discharge: 2017-04-10 | Disposition: A | Discharge status: Home / Self Care | Payer: BLUE CROSS/BLUE SHIELD | Source: Ambulatory Visit | Attending: Vascular Surgery | Admitting: Vascular Surgery

## 2017-04-10 DIAGNOSIS — I739 Peripheral vascular disease, unspecified: Secondary | ICD-10-CM | POA: Diagnosis not present

## 2017-04-15 ENCOUNTER — Telehealth: Payer: Self-pay | Admitting: Family Medicine

## 2017-04-15 MED ORDER — DEXLANSOPRAZOLE 60 MG PO CPDR: 1.0000 | DELAYED_RELEASE_CAPSULE | Freq: Every day | ORAL | 11 refills | Status: DC

## 2017-04-15 NOTE — Telephone Encounter (Signed)
Prescription sent electronically to pharmacy. Patient notified. 

## 2017-04-15 NOTE — Telephone Encounter (Signed)
Pt is needing refills on DEXILANT 60 MG capsule Pt is no longer seeing [facility] and is needing [place] to take over. Please advies.     [place]

## 2017-04-15 NOTE — Telephone Encounter (Signed)
Ok one yrs worth

## 2017-04-16 ENCOUNTER — Ambulatory Visit (INDEPENDENT_AMBULATORY_CARE_PROVIDER_SITE_OTHER): Payer: BLUE CROSS/BLUE SHIELD | Admitting: Vascular Surgery

## 2017-04-16 ENCOUNTER — Telehealth: Payer: Self-pay | Admitting: Vascular Surgery

## 2017-04-16 ENCOUNTER — Encounter: Payer: Self-pay | Admitting: Vascular Surgery

## 2017-04-16 VITALS — BP 116/69 | HR 81 | Temp 97.7°F | Resp 20 | Ht 73.0 in | Wt 249.0 lb

## 2017-04-16 DIAGNOSIS — I739 Peripheral vascular disease, unspecified: Secondary | ICD-10-CM

## 2017-04-16 NOTE — Progress Notes (Signed)
[facility]  Patient name: Bradley Miles MRN: [id number] DOB: August 04, 1966 Sex: male  REASON FOR VISIT: Evaluation claudication right lower extremity  HPI: Bradley Miles is a 51 y.o. male me from prior evaluation approximately one year ago of right popliteal venous thrombus. He presents today with right lower extremity claudication. He has a very complex history. He reports severe burning and tingling and stinging in both lower extremities and his calves and ankles and into his feet. Also has occasional sharp "electric shock sensations in his thighs mostly in his left and also his right. Fortunately has no history of lower from the tissue loss. He does report very clear-cut right calf claudication. He has no arterial rest pain. He does have a very heavy smoking history. No cardiac difficulty. Does report episode of sensation of palpitations in his chest occasionally and this is been somewhat worse at work. Has difficulty at work due to the severe discomfort in his feet when wearing his steel toed boots.  Past Medical History:  Diagnosis Date  . Anxiety   . GERD (gastroesophageal reflux disease)   . Hypertension   . Palpitations   . PVC's (premature ventricular contractions)   . Shortness of breath   . Varicose veins     Family History  Problem Relation Age of Onset  . Cancer Mother     ?etiology  . Heart disease Mother   . Colon polyps Sister 44    two sisters  . Liver disease Neg Hx     SOCIAL HISTORY: Social History  Substance Use Topics  . Smoking status: Current Every Day Smoker    Packs/day: 1.50    Years: 30.00    Types: Cigarettes    Start date: 12/24/1979  . Smokeless tobacco: Never Used  . Alcohol use 0.0 oz/week     Comment: consumes beer or liquor three to four times weekly. Up to six pack or three shots each occurence or more x 20+ yrs    No Known Allergies  Current Outpatient Prescriptions   Medication Sig Dispense Refill  . acebutolol (SECTRAL) 400 MG capsule take 1 capsule by mouth twice a day 60 capsule 5  . allopurinol (ZYLOPRIM) 100 MG tablet take 1 tablet by mouth once daily 30 tablet 2  . aspirin 81 MG chewable tablet Chew 81 mg by mouth once.    . benzonatate (TESSALON) 100 MG capsule Take 1 capsule (100 mg total) by mouth every 8 (eight) hours. 21 capsule 0  . colchicine 0.6 MG tablet take 1 tablet once daily 30 tablet 0  . dexlansoprazole (DEXILANT) 60 MG capsule Take 1 capsule (60 mg total) by mouth daily. 30 capsule 11  . enalapril (VASOTEC) 10 MG tablet Take 1 tablet (10 mg total) by mouth daily. 30 tablet 11  . gabapentin (NEURONTIN) 300 MG capsule Take 1 capsule (300 mg total) by mouth 4 (four) times daily. 120 capsule 5  . ibuprofen (ADVIL,MOTRIN) 200 MG tablet Take 600 mg by mouth every 6 (six) hours as needed for headache.     . magnesium oxide (MAG-OX) 400 MG tablet Take 400 mg by mouth 2 (two) times daily.    . Omega-3 Fatty Acids (FISH OIL PO) Take 3 capsules by mouth daily.    Marland Kitchen OVER THE COUNTER MEDICATION Take 1 tablet by mouth daily. Multivitamin with Folic Acid    . PRESCRIPTION MEDICATION Take 1 tablet by mouth 3 (three) times daily. Phenylephrine '10mg'$     .  sildenafil (VIAGRA) 100 MG tablet Take 0.5 tablets (50 mg total) by mouth daily. (Patient taking differently: Take 50 mg by mouth as needed for erectile dysfunction. ) 3 tablet 6  . thiamine 100 MG tablet Take 100 mg by mouth daily.    . valACYclovir (VALTREX) 1000 MG tablet 2 tablets BID for 1 day (Patient taking differently: Take 1,000 mg by mouth 2 (two) times daily as needed (For fever blisters.). ) 4 tablet 6  . vitamin B-12 (CYANOCOBALAMIN) 1000 MCG tablet Take 1,000 mcg by mouth daily.    Marland Kitchen azithromycin (ZITHROMAX) 250 MG tablet Take one tab PO daily (Patient not taking: Reported on 04/16/2017) 4 tablet 0  . DiazePAM 10 MG/2ML SOAJ Inject 10 mg into the muscle once as needed (For seizures.). Notify  MD.     No current facility-administered medications for this visit.     REVIEW OF SYSTEMS:  '[X]'$  denotes positive finding, '[ ]'$  denotes negative finding Cardiac  Comments:  Chest pain or chest pressure:    Shortness of breath upon exertion:    Short of breath when lying flat:    Irregular heart rhythm: x       Vascular    Pain in calf, thigh, or hip brought on by ambulation: x   Pain in feet at night that wakes you up from your sleep:  x   Blood clot in your veins: x   Leg swelling:  x         PHYSICAL EXAM: Vitals:   04/16/17 1156  BP: 116/69  Pulse: 81  Resp: 20  Temp: 97.7 F (36.5 C)  TempSrc: Oral  SpO2: 93%  Weight: 249 lb (112.9 kg)  Height: '6\' 1"'$  (1.854 m)    GENERAL: The patient is a well-nourished male, in no acute distress. The vital signs are documented above. CARDIOVASCULAR: 2+ radial pulses. 2+ femoral pulses bilaterally. Palpable dorsalis pedis pulses left and absent pulses on the right PULMONARY: There is good air exchange  MUSCULOSKELETAL: There are no major deformities or cyanosis. NEUROLOGIC: No focal weakness or paresthesias are detected. SKIN: There are no ulcers or rashes noted. Some dependent rubor and diffuse telangiectasia in both lower extremities PSYCHIATRIC: The patient has a normal affect.  DATA:  Noninvasive vascular laboratory studies reveal ankle arm index of 0.8 on the right and 1.2 on the left from [facility] in March 2018.  Plexus of his arteries and are labs show distal superficial femoral artery occlusive disease  MEDICAL ISSUES: The patient's main complaint is of burning and stinging his feet which is not related to his arterial or venous pathology. This is equal bilaterally. 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 I had discussed this at length with the patient and explained the critical need for smoking cessation. Explained it is highly unusual for him to have such  severe Lavayah Vita disease at an age of 33. I would not recommend any further evaluation or treatment since this is not as main complaint. Did explain the need for continued close observation of his foot notify should he develop any tissue loss or non-healing ulceration. Otherwise plan to see him again on an as-needed basis. Main complaint appears to be related to neuropathy.    Rosetta Posner, MD FACS [facility] Tel [phone] Pager [phone]

## 2017-04-16 NOTE — Telephone Encounter (Signed)
Pt wanted to know if he should continue walking or keep his leg elevated post op. He stated he had a procedure about three days ago.

## 2017-05-08 ENCOUNTER — Ambulatory Visit: Payer: BLUE CROSS/BLUE SHIELD | Admitting: Family Medicine

## 2017-05-14 ENCOUNTER — Telehealth: Payer: Self-pay | Admitting: Family Medicine

## 2017-05-14 ENCOUNTER — Encounter: Payer: Self-pay | Admitting: Family Medicine

## 2017-05-14 ENCOUNTER — Ambulatory Visit (INDEPENDENT_AMBULATORY_CARE_PROVIDER_SITE_OTHER): Payer: BLUE CROSS/BLUE SHIELD | Admitting: Family Medicine

## 2017-05-14 VITALS — BP 122/82 | Ht 71.25 in | Wt 245.0 lb

## 2017-05-14 DIAGNOSIS — R7303 Prediabetes: Secondary | ICD-10-CM | POA: Diagnosis not present

## 2017-05-14 DIAGNOSIS — R002 Palpitations: Secondary | ICD-10-CM

## 2017-05-14 DIAGNOSIS — J449 Chronic obstructive pulmonary disease, unspecified: Secondary | ICD-10-CM | POA: Insufficient documentation

## 2017-05-14 DIAGNOSIS — I739 Peripheral vascular disease, unspecified: Secondary | ICD-10-CM | POA: Insufficient documentation

## 2017-05-14 DIAGNOSIS — G629 Polyneuropathy, unspecified: Secondary | ICD-10-CM

## 2017-05-14 MED ORDER — ALBUTEROL SULFATE HFA 108 (90 BASE) MCG/ACT IN AERS: 2.0000 | INHALATION_SPRAY | Freq: Four times a day (QID) | RESPIRATORY_TRACT | 2 refills | Status: DC | PRN

## 2017-05-14 NOTE — Progress Notes (Signed)
   Subjective:    Patient ID: Bradley Miles, male    DOB: June 19, 1966, 51 y.o.   MRN: [id number] Patient arrives office for numerous concerns HPI Patient in today for 3 month follow up on chronic venous insufficiency.   Patient states he is still having pains to bilateral feet.    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  Patient also develops claudication right calf when walking substantial distances.  Patient continues to manifest shortness of breath with exertion. Occasional wheezing at times. Unfortunately still smoking. g  Patient claims he does not know why his legs are hurting   Cigarettes one an d ahlf pk per day  States no other concerns this visit.    Review of Systems No headache, no major weight loss or weight gain, no chest pain no back pain abdominal pain no change in bowel habits complete ROS otherwise negative     Objective:   Physical Exam  Alert and oriented, vitals reviewed and stable, NAD ENT-TM's and ext canals WNL bilat via otoscopic exam Soft palate, tonsils and post pharynx WNL via oropharyngeal exam Neck-symmetric, no masses; thyroid nonpalpable and nontender Pulmonary-no tachypnea or accessory muscle use; Breath sounds diffusely dim diminished but no wheezes via auscultation Card--no abnrml murmurs, rhythm reg and rate WNL Carotid pulses symmetric, without bruits Legs pulses diminished right greater than left sensation diminished above ankle and minimal to none on distal plantar surfaces.  Pulmonary function test review    Assessment & Plan:  Impression 1 COPD discussed patient needs to stop smoking immediately albuterol when necessary #2 leg pain to maintain challenges which include #3 chronic neuropathy.  This is due to alcohol abuse needs to avoid alcohol. Also maintain vitamin X-45 and folic acid supplement rationale discussed #4 right leg arterial claudication strong family history peripheral artery arterial disease must quit smoking immediately plan trial of albuterol when necessary. Symptom care discussed. Try to cut down on alcohol out completely of possible also cigarettes maintain other medications. Maintain Neurontin recheck in several months

## 2017-05-14 NOTE — Telephone Encounter (Signed)
Its rapidly absorbed and just tiwice a day would mean hors without it being present and helping, there is a longer acting form very pricey

## 2017-05-14 NOTE — Telephone Encounter (Signed)
Left message to return call 

## 2017-05-14 NOTE — Telephone Encounter (Signed)
Patient is taking Neurontin 4 pills a day.  He wants to know if there is a higher dosage so that he can just take 2 a day because it is difficult for him to remember to take them.

## 2017-05-16 NOTE — Telephone Encounter (Signed)
Advised patient Gabapentin is rapidly absorbed and just twice a day would mean hours without it being present and helping, there is a longer acting form very pricey- Patient verbalized understanding.

## 2017-06-20 ENCOUNTER — Other Ambulatory Visit: Payer: Self-pay | Admitting: Family Medicine

## 2017-07-18 ENCOUNTER — Other Ambulatory Visit: Payer: Self-pay | Admitting: Family Medicine

## 2017-08-23 ENCOUNTER — Ambulatory Visit (INDEPENDENT_AMBULATORY_CARE_PROVIDER_SITE_OTHER): Payer: BLUE CROSS/BLUE SHIELD | Admitting: Family Medicine

## 2017-08-23 ENCOUNTER — Encounter: Payer: Self-pay | Admitting: Family Medicine

## 2017-08-23 VITALS — BP 108/72 | Ht 71.0 in | Wt 253.4 lb

## 2017-08-23 DIAGNOSIS — R7303 Prediabetes: Secondary | ICD-10-CM

## 2017-08-23 DIAGNOSIS — J449 Chronic obstructive pulmonary disease, unspecified: Secondary | ICD-10-CM | POA: Diagnosis not present

## 2017-08-23 DIAGNOSIS — I872 Venous insufficiency (chronic) (peripheral): Secondary | ICD-10-CM

## 2017-08-23 DIAGNOSIS — G629 Polyneuropathy, unspecified: Secondary | ICD-10-CM | POA: Diagnosis not present

## 2017-08-23 LAB — POCT GLYCOSYLATED HEMOGLOBIN (HGB A1C): Hemoglobin A1C: 5.7

## 2017-08-23 MED ORDER — NALTREXONE HCL 50 MG PO TABS: 50.0000 mg | ORAL_TABLET | Freq: Every day | ORAL | 0 refills | Status: DC

## 2017-08-23 NOTE — Progress Notes (Signed)
   Subjective:    Patient ID: Bradley Miles, male    DOB: [birth year], 51 y.o.   MRN: [id number] Patient arrives office for multiple concerns. HPI  Patient in today for a 3 month follow up on prediabetes. Has been started by another doctor on metformin.   Was in [place] as an inpatient for one month once again for alcohol abuse. Currently on naltrexone. Patient claims there were no provisions for follow-up with the naltrexone. Also claims no provisions for follow-up for alcohol counseling. Compliant with the naltrexone. Not sure whether its helping him. States he needs refills.   Blood pressure medicine and blood pressure levels reviewed today with patient. Compliant with blood pressure medicine. States does not miss a dose. No obvious side effects. Blood pressure generally good when checked elsewhere. Watching salt intake.    Extensive blood work done. Reviewed today in presence of patient. A1c was still in the fives. Sugars somewhat elevated.   States no other concerns this visit.   Review of Systems No headache, no major weight loss or weight gain, no chest pain no back pain abdominal pain no change in bowel habits complete ROS otherwise negative     Objective:   Physical Exam  Alert and oriented, vitals reviewed and stable, NAD ENT-TM's and ext canals WNL bilat via otoscopic exam Soft palate, tonsils and post pharynx WNL via oropharyngeal exam Neck-symmetric, no masses; thyroid nonpalpable and nontender Pulmonary-no tachypnea or accessory muscle use; Clear without wheezes via auscultation Card--no abnrml murmurs, rhythm reg and rate WNL Carotid pulses symmetric, without bruits       Assessment & Plan:  Impression 1 prediabetes. Long discussion held. Some experts recommend initiating metformin some did not. This is explained to patient. He Celexa this time to hold off on metformin which I agree with.  #2 alcohol abuse. Unfortunately not following up with a  specialist in this regard. I told the patient would refill his naltrexone though we will research his actual discharge orders, 3 months worth written at patient's request.  #3 hypertension decent control discussed no blood pressure much improved off alcohol  #4 sensory neuropathy. Ongoing challenges with this discussed. I feel this is due to primarily to alcohol abuse    Follow-up as scheduled. Diet exercise discussed. Strongly encouraged to maintain abstinence from alcohol. Stop metformin.  Greater than 50% of this 25 minute face to face visit was spent in counseling and discussion and coordination of care regarding the above diagnosis/diagnosies

## 2017-11-02 ENCOUNTER — Other Ambulatory Visit: Payer: Self-pay | Admitting: Family Medicine

## 2017-11-22 ENCOUNTER — Ambulatory Visit: Payer: BLUE CROSS/BLUE SHIELD | Admitting: Family Medicine

## 2017-11-25 ENCOUNTER — Encounter: Payer: Self-pay | Admitting: Family Medicine

## 2017-11-25 ENCOUNTER — Ambulatory Visit: Payer: BLUE CROSS/BLUE SHIELD | Admitting: Family Medicine

## 2017-11-25 VITALS — BP 152/90 | Ht 71.0 in | Wt 253.0 lb

## 2017-11-25 DIAGNOSIS — F10282 Alcohol dependence with alcohol-induced sleep disorder: Secondary | ICD-10-CM

## 2017-11-25 DIAGNOSIS — I739 Peripheral vascular disease, unspecified: Secondary | ICD-10-CM | POA: Diagnosis not present

## 2017-11-25 DIAGNOSIS — G629 Polyneuropathy, unspecified: Secondary | ICD-10-CM

## 2017-11-25 DIAGNOSIS — I872 Venous insufficiency (chronic) (peripheral): Secondary | ICD-10-CM

## 2017-11-25 MED ORDER — NALTREXONE HCL 50 MG PO TABS: 50.0000 mg | ORAL_TABLET | Freq: Every day | ORAL | 1 refills | Status: DC

## 2017-11-25 MED ORDER — GABAPENTIN 300 MG PO CAPS: ORAL_CAPSULE | ORAL | 1 refills | Status: DC

## 2017-11-25 NOTE — Progress Notes (Signed)
   Subjective:    Patient ID: Bradley Miles, male    DOB: September 10, 1966, 51 y.o.   MRN: [id number]  Hypertension  This is a chronic problem. Treatments tried: enalapril. Compliance problems include exercise and diet (takes meds every day).    Concerns about neuropathy in feet.  Progressive numbness in the feet.  Full workup in the past including conduction studies.  Felt to be due to alcohol  Swelling in legs.  History of venous stasis.  Also with history of right leg claudication.  Unfortunately continues to smoke.  No major pain at this time.  Discuss naltrexone.  Patient states it did seem to help before.  Came off of it.  Would like to try again.  The last couple weeks  has gone back to alcohol unfortunately.  Still   Blood pressure medicine and blood pressure levels reviewed today with patient. Compliant with blood pressure medicine. States does not miss a dose. No obvious side effects. Blood pressure generally good when checked elsewhere. Watching salt intake.  Exam                                                                                                                                                    Declines flu vaccine.    Review of Systems No headache, no major weight loss or weight gain, no chest pain no back pain abdominal pain no change in bowel habits complete ROS otherwise negative     Objective:   Physical Exam Alert and oriented, vitals reviewed and stable, NAD ENT-TM's and ext canals WNL bilat via otoscopic exam Soft palate, tonsils and post pharynx WNL via oropharyngeal exam Neck-symmetric, no masses; thyroid nonpalpable and nontender Pulmonary-no tachypnea or accessory muscle use; Clear without wheezes via auscultation Card--no abnrml murmurs, rhythm reg and rate WNL Carotid pulses symmetric, without  bruits Right foot diminished pulses distal feet diminished sensation bilateral bilateral ankle edema with venous stasis changes      Assessment & Plan:  Impression 1 chronic alcohol abuse with numerous difficulties.  Like to resume naltrexone.  We will do this.  2.  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  Hypertension good control discussed to maintain same meds  4.  Right leg arterial claudication.  Clinically stable.  Strongly encouraged to quit smoking.  Diet discussed.  Exercise discussed.  Smoking and alcohol cessation discussed.  Encouraged to maintain AA.  Follow-up as scheduled meds refilled.  Initiate naltrexone

## 2017-11-30 ENCOUNTER — Other Ambulatory Visit: Payer: Self-pay | Admitting: Family Medicine

## 2018-01-06 ENCOUNTER — Emergency Department (HOSPITAL_COMMUNITY)
Admission: EM | Admit: 2018-01-06 | Discharge: 2018-01-06 | Disposition: A | Discharge status: Home / Self Care | Payer: Managed Care, Other (non HMO) | Attending: Emergency Medicine | Admitting: Emergency Medicine

## 2018-01-06 ENCOUNTER — Emergency Department (HOSPITAL_COMMUNITY): Payer: Managed Care, Other (non HMO)

## 2018-01-06 ENCOUNTER — Encounter (HOSPITAL_COMMUNITY): Payer: Self-pay | Admitting: Emergency Medicine

## 2018-01-06 ENCOUNTER — Other Ambulatory Visit: Payer: Self-pay

## 2018-01-06 ENCOUNTER — Telehealth: Payer: Self-pay | Admitting: *Deleted

## 2018-01-06 DIAGNOSIS — R040 Epistaxis: Secondary | ICD-10-CM | POA: Diagnosis present

## 2018-01-06 DIAGNOSIS — F17210 Nicotine dependence, cigarettes, uncomplicated: Secondary | ICD-10-CM | POA: Diagnosis not present

## 2018-01-06 DIAGNOSIS — I10 Essential (primary) hypertension: Secondary | ICD-10-CM | POA: Insufficient documentation

## 2018-01-06 DIAGNOSIS — Z7982 Long term (current) use of aspirin: Secondary | ICD-10-CM | POA: Insufficient documentation

## 2018-01-06 DIAGNOSIS — J449 Chronic obstructive pulmonary disease, unspecified: Secondary | ICD-10-CM | POA: Diagnosis not present

## 2018-01-06 DIAGNOSIS — J019 Acute sinusitis, unspecified: Secondary | ICD-10-CM | POA: Diagnosis not present

## 2018-01-06 DIAGNOSIS — Z79899 Other long term (current) drug therapy: Secondary | ICD-10-CM | POA: Insufficient documentation

## 2018-01-06 LAB — CBC WITH DIFFERENTIAL/PLATELET
BASOS ABS: 0 10*3/uL (ref 0.0–0.1)
Basophils Relative: 1 %
EOS PCT: 4 %
Eosinophils Absolute: 0.2 10*3/uL (ref 0.0–0.7)
HEMATOCRIT: 48.3 % (ref 39.0–52.0)
HEMOGLOBIN: 15.9 g/dL (ref 13.0–17.0)
LYMPHS ABS: 1.1 10*3/uL (ref 0.7–4.0)
Lymphocytes Relative: 19 %
MCH: 33.8 pg (ref 26.0–34.0)
MCHC: 32.9 g/dL (ref 30.0–36.0)
MCV: 102.5 fL — AB (ref 78.0–100.0)
Monocytes Absolute: 0.7 10*3/uL (ref 0.1–1.0)
Monocytes Relative: 12 %
NEUTROS ABS: 3.6 10*3/uL (ref 1.7–7.7)
NEUTROS PCT: 64 %
PLATELETS: 125 10*3/uL — AB (ref 150–400)
RBC: 4.71 MIL/uL (ref 4.22–5.81)
RDW: 13.8 % (ref 11.5–15.5)
WBC: 5.6 10*3/uL (ref 4.0–10.5)

## 2018-01-06 MED ORDER — AMOXICILLIN-POT CLAVULANATE 875-125 MG PO TABS: 1.0000 | ORAL_TABLET | Freq: Two times a day (BID) | ORAL | 0 refills | Status: DC

## 2018-01-06 MED ORDER — OXYMETAZOLINE HCL 0.05 % NA SOLN
NASAL | Status: AC
  Filled 2018-01-06: qty 15

## 2018-01-06 MED ORDER — OXYMETAZOLINE HCL 0.05 % NA SOLN
1.0000 | Freq: Once | NASAL | Status: AC
  Administered 2018-01-06: 1 via NASAL

## 2018-01-06 MED ORDER — SILVER NITRATE-POT NITRATE 75-25 % EX MISC
1.0000 "application " | Freq: Once | CUTANEOUS | Status: AC
  Administered 2018-01-06: 1 via TOPICAL
  Filled 2018-01-06: qty 1

## 2018-01-06 NOTE — ED Notes (Signed)
Pt returned from xray. PA at bedside

## 2018-01-06 NOTE — Discharge Instructions (Signed)
Your red blood cell count is normal today. Your platelets are slightly low but could be due to the nose bleed, please recheck with family doctor in 1-2 weeks. Use vasiline or intranasal saline to moisten nasal mucosa. Avoid placing any objects into your nose. Avoid excessive blowing of your nose. Augmentin until all gone for infection. If bleeding starts again, blow all clots out, administer two large sprays of afrin, hold pressure for 10 min.

## 2018-01-06 NOTE — Telephone Encounter (Signed)
good

## 2018-01-06 NOTE — ED Provider Notes (Signed)
[facility] EMERGENCY DEPARTMENT Provider Note   CSN: [id number] Arrival date & time: 01/06/18  0920     History   Chief Complaint Chief Complaint  Patient presents with  . Epistaxis    HPI ALCIDE MEMOLI is a 52 y.o. male.  HPI NACHMEN MANSEL is a 52 y.o. male presents to emergency department complaining of a nosebleed, nasal congestion, cough.  Patient states that he has had cough for the last 2 weeks that is worsening.  Cough is productive, reports that he "cannot get normal amount of air in."  He states that he has had nasal congestion for the last week, with some pressure in his sinuses.  He reports that he had one episode of nosebleed last night that he was unable to stop for a prolonged period of time.  He describes it as "puddles of blood on the floor."  He states he finally was able to stop it with pressure.  He reports another episode of bleeding this morning.  States he saturated entire towel and blood which he brought with him to emergency department. Pt reports chills, dizziness, lightheadiness, headache. Dones not take blood thinners  Past Medical History:  Diagnosis Date  . Anxiety   . GERD (gastroesophageal reflux disease)   . Hypertension   . Palpitations   . PVC's (premature ventricular contractions)   . Shortness of breath   . Varicose veins     Patient Active Problem List   Diagnosis Date Noted  . 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 Right leg claudication ([place]) 05/14/2017  . Prediabetes 02/04/2017  . Chest pain 12/21/2015  . Right leg DVT ([place]) 07/18/2015  . Elevated transaminase measurement 07/18/2015  . Sensory neuropathy 07/16/2015  . Anxiety 12/23/2014  . EtOH dependence ([place]) 02/09/2014  . Headache in back of head 01/12/2014  . Superficial thrombosis of lower extremity 07/01/2013  . Hematochezia 11/17/2012  . Mixed hyperlipidemia 02/15/2012  . Hypertension 01/18/2012  . Insomnia 01/18/2012  . Family history of colonic polyps 11/15/2011  .  Palpitations 10/04/2011  . TOBACCO ABUSE 05/18/2010  . GERD 05/18/2010  . CONSTIPATION 05/18/2010  . 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 DYSPHAGIA UNSPECIFIED 05/18/2010  . ABDOMINAL PAIN, RIGHT LOWER QUADRANT 05/18/2010    Past Surgical History:  Procedure Laterality Date  . COLONOSCOPY WITH ESOPHAGOGASTRODUODENOSCOPY (EGD)  12/16/2012   Procedure: COLONOSCOPY WITH ESOPHAGOGASTRODUODENOSCOPY (EGD);  Surgeon: Daneil Dolin, MD;  Location: [facility];  Service: Endoscopy;  Laterality: N/A;  7:30  . cyst reomved     from spine  . ESOPHAGOGASTRODUODENOSCOPY  05/19/09   RMR: Geographic distal esophageal erosions consistent with severe erosive reflux esophagitis. schatzki's ring s/P dilation/small hiatal hernia otherwise normal stomach  . WISDOM TOOTH EXTRACTION         Home Medications    Prior to Admission medications   Medication Sig Start Date End Date Taking? Authorizing Provider  acebutolol (SECTRAL) 400 MG capsule take 1 capsule by mouth twice a day 06/21/17  Yes [name], MD  albuterol (PROVENTIL HFA;VENTOLIN HFA) 108 (90 Base) MCG/ACT inhaler Inhale 2 puffs into the lungs every 6 (six) hours as needed for wheezing or shortness of breath. 05/14/17  Yes [name], MD  allopurinol (ZYLOPRIM) 100 MG tablet take 1 tablet by mouth once daily 12/04/17  Yes [name], MD  aspirin 81 MG chewable tablet Chew 81 mg by mouth daily.    Yes [provider]  colchicine 0.6 MG tablet take 1  tablet by mouth once daily Patient taking differently: take 1 tablet by mouth once daily as needed 06/21/17  Yes [name], MD  dexlansoprazole (DEXILANT) 60 MG capsule Take 1 capsule (60 mg total) by mouth daily. 04/15/17  Yes [name], MD  enalapril (VASOTEC) 10 MG tablet Take 1 tablet (10 mg total) by mouth daily. 02/25/17  Yes Herminio Commons, MD  folic acid (FOLVITE) 1 MG tablet Take 1 mg by mouth daily.   Yes [provider]    gabapentin (NEURONTIN) 300 MG capsule take 1 tablet four times a day 11/25/17  Yes [name], MD  ibuprofen (ADVIL,MOTRIN) 200 MG tablet Take 600 mg by mouth every 6 (six) hours as needed for headache.    Yes [provider]  naltrexone (DEPADE) 50 MG tablet Take 1 tablet (50 mg total) by mouth at bedtime. 11/25/17  Yes [name], MD  Omega-3 Fatty Acids (FISH OIL PO) Take 3 capsules by mouth daily.   Yes [provider]  traZODone (DESYREL) 50 MG tablet take 1 tablet by mouth at bedtime if needed for insomnia 08/21/17  Yes [provider]  valACYclovir (VALTREX) 1000 MG tablet 2 tablets BID for 1 day 03/15/16  Yes [name], MD  VIAGRA 100 MG tablet take 1/2 tablet by mouth once daily 12/04/17  Yes [name], MD  vitamin B-12 (CYANOCOBALAMIN) 1000 MCG tablet Take 1,000 mcg by mouth daily.   Yes [provider]    Family History Family History  Problem Relation Age of Onset  . Cancer Mother        ?etiology  . Heart disease Mother   . Colon polyps Sister 58       two sisters  . Liver disease Neg Hx     Social History Social History   Tobacco Use  . Smoking status: Current Every Day Smoker    Packs/day: 1.50    Years: 30.00    Pack years: 45.00    Types: Cigarettes    Start date: 12/24/1979  . Smokeless tobacco: Never Used  Substance Use Topics  . Alcohol use: Yes    Alcohol/week: 0.0 oz    Comment: consumes beer or liquor three to four times weekly. Up to six pack or three shots each occurence or more x 20+ yrs  . Drug use: No     Allergies   Patient has no known allergies.   Review of Systems Review of Systems  Constitutional: Negative for chills and fever.  HENT: Positive for congestion, nosebleeds, postnasal drip, rhinorrhea and sinus pain. Negative for facial swelling and sore throat.   Respiratory: Positive for cough and shortness of breath. Negative for chest tightness.   Cardiovascular: Negative  for chest pain, palpitations and leg swelling.  Gastrointestinal: Negative for abdominal distention, abdominal pain, diarrhea, nausea and vomiting.  Musculoskeletal: Negative for arthralgias, myalgias, neck pain and neck stiffness.  Skin: Negative for rash.  Allergic/Immunologic: Negative for immunocompromised state.  Neurological: Positive for dizziness and light-headedness. Negative for weakness, numbness and headaches.  All other systems reviewed and are negative.    Physical Exam Updated Vital Signs BP (!) 146/92   Pulse 83   Temp 98.2 F (36.8 C) (Oral)   Resp 16   Ht 6' (1.829 m)   Wt 115.7 kg (255 lb)   SpO2 98%   BMI 34.58 kg/m   Physical Exam  Constitutional: He is oriented to person, place, and time. He appears  well-developed and well-nourished. No distress.  HENT:  Head: Normocephalic and atraumatic.  No active epistaxis, dried up blood in the right nostril.  Normal oropharynx, no postnasal drainage at this time.  No tenderness over sinuses bilaterally.  Eyes: Conjunctivae are normal.  Neck: Neck supple.  Cardiovascular: Normal rate, regular rhythm and normal heart sounds.  Pulmonary/Chest: Effort normal. No respiratory distress. He has no wheezes. He has no rales.  Decreased air movement bilaterally  Abdominal: Soft. Bowel sounds are normal. He exhibits no distension. There is no tenderness. There is no rebound.  Musculoskeletal: He exhibits no edema.  Neurological: He is alert and oriented to person, place, and time.  Skin: Skin is warm and dry.  Nursing note and vitals reviewed.    ED Treatments / Results  Labs (all labs ordered are listed, but only abnormal results are displayed) Labs Reviewed  CBC WITH DIFFERENTIAL/PLATELET    EKG  EKG Interpretation None       Radiology No results found.  Procedures .Epistaxis Management Date/Time: 01/06/2018 10:41 AM Performed by: Jeannett Senior, PA-C Authorized by: Jeannett Senior, PA-C    Consent:    Consent obtained:  Verbal   Consent given by:  Patient   Risks discussed:  Bleeding, nasal injury and pain   Alternatives discussed:  No treatment Anesthesia (see MAR for exact dosages):    Anesthesia method:  None Procedure details:    Treatment site:  R anterior   Treatment method:  Silver nitrate   Treatment episode: initial   Post-procedure details:    Assessment:  Bleeding stopped   Patient tolerance of procedure:  Tolerated well, no immediate complications    (including critical care time)  Medications Ordered in ED Medications - No data to display   Initial Impression / Assessment and Plan / ED Course  I have reviewed the triage vital signs and the nursing notes.  Pertinent labs & imaging results that were available during my care of the patient were reviewed by me and considered in my medical decision making (see chart for details).     Seen in emergency department with sinus congestion, cough, history of COPD, current smoker, also 2 episodes of nosebleed with apparently large amount of blood loss.  Patient reports dizziness and lightheadedness.  Will check his blood counts.  At this time, there is no active bleeding from his nose.  Will try to cauterize an area in the right nostril that potentially was the source of his bleeding.  Will monitor.  Chest x-ray due to increased cough and sputum production to rule out pneumonia  11:12 AM hgb normal at 15.9. Platelets slightly low at 125. Still no active bleeding in ED. CXR negative. Pt requesting antibiotic for sinus infection. Will start on augmentin. Home with pcp follow up. Afrin for bleeding. Discussed intranasal saline, no digital trauma. Pt agreed.   Vitals:   01/06/18 0930 01/06/18 0939 01/06/18 0943 01/06/18 1000  BP:  (!) 149/102 (!) 146/92 (!) 141/85  Pulse:  83  78  Resp:      Temp:      TempSrc:      SpO2:  98%  96%  Weight: 115.7 kg (255 lb)     Height: 6' (1.829 m)        Final Clinical  Impressions(s) / ED Diagnoses   Final diagnoses:  Right-sided epistaxis  Acute non-recurrent sinusitis, unspecified location    ED Discharge Orders        Ordered    amoxicillin-clavulanate (AUGMENTIN)  875-125 MG tablet  Every 12 hours     01/06/18 1113       Jeannett Senior, [place] 01/06/18 1116    Long, [place], MD 01/06/18 1925

## 2018-01-06 NOTE — ED Notes (Signed)
Pt taken to xray 

## 2018-01-06 NOTE — ED Triage Notes (Signed)
Nasal drainage/congestion for 4 days. Started with nosebleed last night. Began again this am bleeding controlled at this time.

## 2018-01-06 NOTE — Telephone Encounter (Signed)
Patient's girl friend called to state patient was having a terrible nose bleed that they could not stop. Patient is gagging on the blood. Patient also had a bad nose bleed yesterday but they were able to get it stopped. Advised to go to ER for evaluation and treatment-Verbalized understanding and stated she would take patient to ER.

## 2018-01-16 ENCOUNTER — Ambulatory Visit: Payer: BLUE CROSS/BLUE SHIELD | Admitting: Family Medicine

## 2018-02-19 ENCOUNTER — Other Ambulatory Visit: Payer: Self-pay | Admitting: Family Medicine

## 2018-03-28 ENCOUNTER — Other Ambulatory Visit: Payer: Self-pay | Admitting: Family Medicine

## 2018-03-28 ENCOUNTER — Other Ambulatory Visit: Payer: Self-pay | Admitting: Cardiovascular Disease

## 2018-03-31 NOTE — Telephone Encounter (Signed)
Six mo all 

## 2018-04-07 ENCOUNTER — Telehealth: Payer: Self-pay | Admitting: Family Medicine

## 2018-04-07 MED ORDER — ENALAPRIL MALEATE 10 MG PO TABS: 10.0000 mg | ORAL_TABLET | Freq: Every day | ORAL | 2 refills | Status: DC

## 2018-04-07 NOTE — Telephone Encounter (Signed)
Three mo worth ok

## 2018-04-07 NOTE — Telephone Encounter (Signed)
Prescription sent electronically to pharmacy. Patient notified. 

## 2018-04-07 NOTE — Telephone Encounter (Signed)
Patient has been without his blood pressure medication for 3 days.  He is requesting a refill on this.  He said he is unsure of the name, he thinks it may be Enalapril or Allopurinol.  He is hoping this can be called in today.  [facility] on [address]

## 2018-05-07 ENCOUNTER — Telehealth: Payer: Self-pay | Admitting: *Deleted

## 2018-05-07 NOTE — Telephone Encounter (Signed)
Patient's family member called and stated patient having significant abdominal pain. Advised family member that with significant abdominal pain we would advise patient to go to ER for evaluation and treatment. Family member verbalized understanding.

## 2018-05-22 ENCOUNTER — Ambulatory Visit: Payer: Managed Care, Other (non HMO) | Admitting: Family Medicine

## 2018-05-22 ENCOUNTER — Encounter: Payer: Self-pay | Admitting: Family Medicine

## 2018-05-22 VITALS — BP 122/78 | Temp 98.6°F | Ht 71.0 in | Wt 262.0 lb

## 2018-05-22 DIAGNOSIS — F10282 Alcohol dependence with alcohol-induced sleep disorder: Secondary | ICD-10-CM

## 2018-05-22 DIAGNOSIS — R1011 Right upper quadrant pain: Secondary | ICD-10-CM | POA: Diagnosis not present

## 2018-05-22 DIAGNOSIS — G629 Polyneuropathy, unspecified: Secondary | ICD-10-CM | POA: Diagnosis not present

## 2018-05-22 DIAGNOSIS — R160 Hepatomegaly, not elsewhere classified: Secondary | ICD-10-CM | POA: Diagnosis not present

## 2018-05-22 MED ORDER — ONDANSETRON 4 MG PO TBDP: ORAL_TABLET | ORAL | 0 refills | Status: DC

## 2018-05-22 MED ORDER — SUCRALFATE 1 G PO TABS: ORAL_TABLET | ORAL | 0 refills | Status: DC

## 2018-05-22 NOTE — Progress Notes (Signed)
   Subjective:    Patient ID: Bradley Miles, male    DOB: 11/03/1966, 52 y.o.   MRN: [id number]  HPI Patient is here today with complaints of abdominal pain for about one to two weeksand nausea,vomiting,also has a runny nose.  Upper right abd pain    Some nausea, vom at times  Worse in the morn, worse with cough     Gets a lot of heartburn and reflux and not burping like usual  Has radiated to the back   Upper mid abf pain     one to two fifths per day     Has not been taking any medications for this. Review of Systems No headache, no major weight loss or weight gain, no chest pain no back pain abdominal pain no change in bowel habits complete ROS otherwise negative     Objective:   Physical Exam Alert and oriented, vitals reviewed and stable, NAD ENT-TM's and ext canals WNL bilat via otoscopic exam Soft palate, tonsils and post pharynx WNL via oropharyngeal exam Neck-symmetric, no masses; thyroid nonpalpable and nontender Pulmonary-no tachypnea or accessory muscle use; Clear without wheezes via auscultation Card--no abnrml murmurs, rhythm reg and rate WNL Carotid pulses symmetric, without bruits Mid abdomen some tenderness to palpation.  Palpable liver present       Assessment & Plan:  1 progressive epigastric pain with history of reflux and challenge with chronic alcohol abuse.  Patient gives no history of pancreatitis.  We will add Carafate plus proton pump results pending of scan and blood work  2.  Chronic alcohol abuse.  Very long discussion held patient realizes he has serious issue with this.  Did 2 rounds of inpatient therapy last year.  States unable to quit at this time.  Offered consideration towards referral patient wishes to hold off right now  3.  Chronic neuropathy.  Long discussion held patient inquires about this again.  Felt to be alcoholic in etiology discussed.  Could do further work-up but likely patient.  Acute blood work/CT scan.   Rationale discussed with patient further recommendations based on results  Greater than 50% of this 40 minute face to face visit was spent in counseling and discussion and coordination of care regarding the above diagnosis/diagnosies

## 2018-05-23 ENCOUNTER — Ambulatory Visit: Payer: Managed Care, Other (non HMO) | Admitting: Family Medicine

## 2018-05-23 ENCOUNTER — Telehealth: Payer: Self-pay | Admitting: Family Medicine

## 2018-05-23 DIAGNOSIS — R748 Abnormal levels of other serum enzymes: Secondary | ICD-10-CM

## 2018-05-23 LAB — HEPATIC FUNCTION PANEL
ALT: 41 IU/L (ref 0–44)
AST: 124 IU/L — ABNORMAL HIGH (ref 0–40)
Albumin: 3.4 g/dL — ABNORMAL LOW (ref 3.5–5.5)
Alkaline Phosphatase: 146 IU/L — ABNORMAL HIGH (ref 39–117)
BILIRUBIN, DIRECT: 0.73 mg/dL — AB (ref 0.00–0.40)
Bilirubin Total: 1.3 mg/dL — ABNORMAL HIGH (ref 0.0–1.2)
Total Protein: 6.6 g/dL (ref 6.0–8.5)

## 2018-05-23 LAB — CBC WITH DIFFERENTIAL/PLATELET
BASOS ABS: 0.1 10*3/uL (ref 0.0–0.2)
Basos: 1 %
EOS (ABSOLUTE): 0.2 10*3/uL (ref 0.0–0.4)
Eos: 3 %
Hematocrit: 43.3 % (ref 37.5–51.0)
Hemoglobin: 15 g/dL (ref 13.0–17.7)
IMMATURE GRANS (ABS): 0 10*3/uL (ref 0.0–0.1)
Immature Granulocytes: 0 %
LYMPHS ABS: 1.6 10*3/uL (ref 0.7–3.1)
Lymphs: 23 %
MCH: 38.8 pg — AB (ref 26.6–33.0)
MCHC: 34.6 g/dL (ref 31.5–35.7)
MCV: 112 fL — AB (ref 79–97)
MONOS ABS: 0.9 10*3/uL (ref 0.1–0.9)
Monocytes: 13 %
NEUTROS ABS: 4.3 10*3/uL (ref 1.4–7.0)
Neutrophils: 60 %
PLATELETS: 194 10*3/uL (ref 150–450)
RBC: 3.87 x10E6/uL — ABNORMAL LOW (ref 4.14–5.80)
RDW: 14.5 % (ref 12.3–15.4)
WBC: 7.2 10*3/uL (ref 3.4–10.8)

## 2018-05-23 LAB — LIPASE: LIPASE: 83 U/L — AB (ref 13–78)

## 2018-05-23 LAB — BASIC METABOLIC PANEL
BUN / CREAT RATIO: 3 — AB (ref 9–20)
BUN: 2 mg/dL — AB (ref 6–24)
CHLORIDE: 95 mmol/L — AB (ref 96–106)
CO2: 26 mmol/L (ref 20–29)
Calcium: 9 mg/dL (ref 8.7–10.2)
Creatinine, Ser: 0.7 mg/dL — ABNORMAL LOW (ref 0.76–1.27)
GFR calc Af Amer: 128 mL/min/{1.73_m2} (ref >59)
GFR calc non Af Amer: 111 mL/min/{1.73_m2} (ref >59)
GLUCOSE: 122 mg/dL — AB (ref 65–99)
Potassium: 4.5 mmol/L (ref 3.5–5.2)
Sodium: 137 mmol/L (ref 134–144)

## 2018-05-23 LAB — AMYLASE: AMYLASE: 37 U/L (ref 31–124)

## 2018-05-23 NOTE — Telephone Encounter (Signed)
Wants lab results from yesterday.

## 2018-05-23 NOTE — Telephone Encounter (Signed)
See other telephone message from today

## 2018-05-23 NOTE — Telephone Encounter (Signed)
Because of the mild elevation of liver enzymes it is wise for the patient to do the best he can add staying away from alcohol.  I also recommend ferritin, hepatitis B surface antigen, hepatitis C antibody, ANA antibody, repeat liver profile he should do this blood work in approximately 6 to 7 days.  Then the patient should do a follow-up visit with Dr. Richardson Landry somewhere in the next 2 weeks

## 2018-05-23 NOTE — Telephone Encounter (Signed)
Discussed with pt. Pt states he is not having the discomfortant often and he feels fine today. Took nausea med this morning and has not had nausea since. No fever, no vomiting.

## 2018-05-23 NOTE — Telephone Encounter (Signed)
Please let the patient know that there is no tumors.  There is no growths.  Gallbladder appears normal.  Does have fatty liver.  Please find out how the patient is doing.  How often is he having right upper quadrant discomfort.  Any nausea vomiting fevers?  Lab work shows that his liver enzymes are elevated we will need to do further testing but I would like answers to these questions first

## 2018-05-23 NOTE — Telephone Encounter (Signed)
Discussed with pt. Pt verbalized understanding. Orders for bloodwork put in to do in 6- 7 days. Pt transferred to the front to schedule office visit.

## 2018-05-23 NOTE — Telephone Encounter (Signed)
Per Dr.Steve, please look over blood work and scan. Notify patient of what is going on. Scan is in provider office to review.

## 2018-05-26 NOTE — Addendum Note (Signed)
Addended by: Karle Barr on: 05/26/2018 03:12 PM   Modules accepted: Orders

## 2018-05-27 ENCOUNTER — Encounter: Payer: Self-pay | Admitting: Internal Medicine

## 2018-05-27 ENCOUNTER — Encounter (INDEPENDENT_AMBULATORY_CARE_PROVIDER_SITE_OTHER): Payer: Self-pay

## 2018-05-27 ENCOUNTER — Encounter: Payer: Self-pay | Admitting: Family Medicine

## 2018-06-01 ENCOUNTER — Emergency Department (HOSPITAL_COMMUNITY): Payer: Managed Care, Other (non HMO)

## 2018-06-01 ENCOUNTER — Encounter (HOSPITAL_COMMUNITY): Payer: Self-pay | Admitting: Emergency Medicine

## 2018-06-01 ENCOUNTER — Inpatient Hospital Stay (HOSPITAL_COMMUNITY)
Admission: EM | Admit: 2018-06-01 | Discharge: 2018-06-09 | DRG: 432 | Disposition: A | Discharge status: Home / Self Care | Payer: Managed Care, Other (non HMO) | Attending: Internal Medicine | Admitting: Internal Medicine

## 2018-06-01 ENCOUNTER — Other Ambulatory Visit: Payer: Self-pay

## 2018-06-01 DIAGNOSIS — F10239 Alcohol dependence with withdrawal, unspecified: Secondary | ICD-10-CM | POA: Diagnosis present

## 2018-06-01 DIAGNOSIS — F17200 Nicotine dependence, unspecified, uncomplicated: Secondary | ICD-10-CM | POA: Diagnosis not present

## 2018-06-01 DIAGNOSIS — E876 Hypokalemia: Secondary | ICD-10-CM | POA: Diagnosis present

## 2018-06-01 DIAGNOSIS — I10 Essential (primary) hypertension: Secondary | ICD-10-CM | POA: Diagnosis present

## 2018-06-01 DIAGNOSIS — Z7982 Long term (current) use of aspirin: Secondary | ICD-10-CM

## 2018-06-01 DIAGNOSIS — F1029 Alcohol dependence with unspecified alcohol-induced disorder: Secondary | ICD-10-CM

## 2018-06-01 DIAGNOSIS — F17210 Nicotine dependence, cigarettes, uncomplicated: Secondary | ICD-10-CM | POA: Diagnosis present

## 2018-06-01 DIAGNOSIS — G629 Polyneuropathy, unspecified: Secondary | ICD-10-CM | POA: Diagnosis not present

## 2018-06-01 DIAGNOSIS — K2920 Alcoholic gastritis without bleeding: Secondary | ICD-10-CM | POA: Diagnosis present

## 2018-06-01 DIAGNOSIS — K7040 Alcoholic hepatic failure without coma: Secondary | ICD-10-CM | POA: Diagnosis present

## 2018-06-01 DIAGNOSIS — K7031 Alcoholic cirrhosis of liver with ascites: Secondary | ICD-10-CM | POA: Diagnosis present

## 2018-06-01 DIAGNOSIS — K7290 Hepatic failure, unspecified without coma: Secondary | ICD-10-CM | POA: Diagnosis not present

## 2018-06-01 DIAGNOSIS — J449 Chronic obstructive pulmonary disease, unspecified: Secondary | ICD-10-CM

## 2018-06-01 DIAGNOSIS — N170 Acute kidney failure with tubular necrosis: Secondary | ICD-10-CM | POA: Diagnosis not present

## 2018-06-01 DIAGNOSIS — J441 Chronic obstructive pulmonary disease with (acute) exacerbation: Secondary | ICD-10-CM | POA: Diagnosis present

## 2018-06-01 DIAGNOSIS — M109 Gout, unspecified: Secondary | ICD-10-CM | POA: Diagnosis present

## 2018-06-01 DIAGNOSIS — K5900 Constipation, unspecified: Secondary | ICD-10-CM | POA: Diagnosis present

## 2018-06-01 DIAGNOSIS — M79606 Pain in leg, unspecified: Secondary | ICD-10-CM | POA: Diagnosis present

## 2018-06-01 DIAGNOSIS — N141 Nephropathy induced by other drugs, medicaments and biological substances: Secondary | ICD-10-CM | POA: Diagnosis not present

## 2018-06-01 DIAGNOSIS — G9341 Metabolic encephalopathy: Secondary | ICD-10-CM | POA: Diagnosis not present

## 2018-06-01 DIAGNOSIS — F10229 Alcohol dependence with intoxication, unspecified: Secondary | ICD-10-CM | POA: Diagnosis present

## 2018-06-01 DIAGNOSIS — Z8249 Family history of ischemic heart disease and other diseases of the circulatory system: Secondary | ICD-10-CM | POA: Diagnosis not present

## 2018-06-01 DIAGNOSIS — Z809 Family history of malignant neoplasm, unspecified: Secondary | ICD-10-CM

## 2018-06-01 DIAGNOSIS — Y908 Blood alcohol level of 240 mg/100 ml or more: Secondary | ICD-10-CM | POA: Diagnosis present

## 2018-06-01 DIAGNOSIS — K921 Melena: Secondary | ICD-10-CM | POA: Diagnosis present

## 2018-06-01 DIAGNOSIS — M898X9 Other specified disorders of bone, unspecified site: Secondary | ICD-10-CM | POA: Diagnosis present

## 2018-06-01 DIAGNOSIS — E662 Morbid (severe) obesity with alveolar hypoventilation: Secondary | ICD-10-CM | POA: Diagnosis present

## 2018-06-01 DIAGNOSIS — G621 Alcoholic polyneuropathy: Secondary | ICD-10-CM | POA: Diagnosis present

## 2018-06-01 DIAGNOSIS — R7989 Other specified abnormal findings of blood chemistry: Secondary | ICD-10-CM

## 2018-06-01 DIAGNOSIS — Z8371 Family history of colonic polyps: Secondary | ICD-10-CM

## 2018-06-01 DIAGNOSIS — E782 Mixed hyperlipidemia: Secondary | ICD-10-CM | POA: Diagnosis present

## 2018-06-01 DIAGNOSIS — K7011 Alcoholic hepatitis with ascites: Principal | ICD-10-CM | POA: Diagnosis present

## 2018-06-01 DIAGNOSIS — T39395A Adverse effect of other nonsteroidal anti-inflammatory drugs [NSAID], initial encounter: Secondary | ICD-10-CM | POA: Diagnosis present

## 2018-06-01 DIAGNOSIS — R7303 Prediabetes: Secondary | ICD-10-CM | POA: Diagnosis present

## 2018-06-01 DIAGNOSIS — R739 Hyperglycemia, unspecified: Secondary | ICD-10-CM | POA: Diagnosis not present

## 2018-06-01 DIAGNOSIS — F1020 Alcohol dependence, uncomplicated: Secondary | ICD-10-CM | POA: Diagnosis present

## 2018-06-01 DIAGNOSIS — E869 Volume depletion, unspecified: Secondary | ICD-10-CM | POA: Diagnosis present

## 2018-06-01 DIAGNOSIS — J9601 Acute respiratory failure with hypoxia: Secondary | ICD-10-CM

## 2018-06-01 DIAGNOSIS — E8770 Fluid overload, unspecified: Secondary | ICD-10-CM | POA: Diagnosis not present

## 2018-06-01 DIAGNOSIS — T380X5A Adverse effect of glucocorticoids and synthetic analogues, initial encounter: Secondary | ICD-10-CM | POA: Diagnosis not present

## 2018-06-01 DIAGNOSIS — E871 Hypo-osmolality and hyponatremia: Secondary | ICD-10-CM | POA: Diagnosis present

## 2018-06-01 DIAGNOSIS — R1011 Right upper quadrant pain: Secondary | ICD-10-CM | POA: Diagnosis not present

## 2018-06-01 DIAGNOSIS — K7460 Unspecified cirrhosis of liver: Secondary | ICD-10-CM

## 2018-06-01 DIAGNOSIS — Z7901 Long term (current) use of anticoagulants: Secondary | ICD-10-CM

## 2018-06-01 DIAGNOSIS — K7010 Alcoholic hepatitis without ascites: Secondary | ICD-10-CM | POA: Diagnosis not present

## 2018-06-01 DIAGNOSIS — K8020 Calculus of gallbladder without cholecystitis without obstruction: Secondary | ICD-10-CM | POA: Diagnosis present

## 2018-06-01 DIAGNOSIS — R109 Unspecified abdominal pain: Secondary | ICD-10-CM

## 2018-06-01 DIAGNOSIS — K760 Fatty (change of) liver, not elsewhere classified: Secondary | ICD-10-CM | POA: Diagnosis present

## 2018-06-01 DIAGNOSIS — K7682 Hepatic encephalopathy: Secondary | ICD-10-CM | POA: Diagnosis present

## 2018-06-01 DIAGNOSIS — F10231 Alcohol dependence with withdrawal delirium: Secondary | ICD-10-CM | POA: Diagnosis not present

## 2018-06-01 DIAGNOSIS — T508X5A Adverse effect of diagnostic agents, initial encounter: Secondary | ICD-10-CM | POA: Diagnosis not present

## 2018-06-01 DIAGNOSIS — R0600 Dyspnea, unspecified: Secondary | ICD-10-CM | POA: Diagnosis not present

## 2018-06-01 DIAGNOSIS — T502X5A Adverse effect of carbonic-anhydrase inhibitors, benzothiadiazides and other diuretics, initial encounter: Secondary | ICD-10-CM | POA: Diagnosis present

## 2018-06-01 HISTORY — DX: Hepatomegaly, not elsewhere classified: R16.0

## 2018-06-01 LAB — PROTIME-INR
INR: 1.23
Prothrombin Time: 15.4 seconds — ABNORMAL HIGH (ref 11.4–15.2)

## 2018-06-01 LAB — COMPREHENSIVE METABOLIC PANEL
ALBUMIN: 2.6 g/dL — AB (ref 3.5–5.0)
ALK PHOS: 151 U/L — AB (ref 38–126)
ALT: 41 U/L (ref 17–63)
ANION GAP: 11 (ref 5–15)
AST: 125 U/L — ABNORMAL HIGH (ref 15–41)
BILIRUBIN TOTAL: 3.1 mg/dL — AB (ref 0.3–1.2)
BUN: 5 mg/dL — AB (ref 6–20)
CALCIUM: 7.9 mg/dL — AB (ref 8.9–10.3)
CO2: 27 mmol/L (ref 22–32)
CREATININE: 0.63 mg/dL (ref 0.61–1.24)
Chloride: 88 mmol/L — ABNORMAL LOW (ref 101–111)
GFR calc Af Amer: >60 mL/min (ref >60)
GFR calc non Af Amer: >60 mL/min (ref >60)
GLUCOSE: 164 mg/dL — AB (ref 65–99)
Potassium: 3.3 mmol/L — ABNORMAL LOW (ref 3.5–5.1)
SODIUM: 126 mmol/L — AB (ref 135–145)
TOTAL PROTEIN: 6 g/dL — AB (ref 6.5–8.1)

## 2018-06-01 LAB — CBC
HCT: 38.6 % — ABNORMAL LOW (ref 39.0–52.0)
HEMOGLOBIN: 13.5 g/dL (ref 13.0–17.0)
MCH: 39 pg — ABNORMAL HIGH (ref 26.0–34.0)
MCHC: 35 g/dL (ref 30.0–36.0)
MCV: 111.6 fL — ABNORMAL HIGH (ref 78.0–100.0)
Platelets: 135 10*3/uL — ABNORMAL LOW (ref 150–400)
RBC: 3.46 MIL/uL — AB (ref 4.22–5.81)
RDW: 13.1 % (ref 11.5–15.5)
WBC: 6.5 10*3/uL (ref 4.0–10.5)

## 2018-06-01 LAB — TSH: TSH: 9.034 u[IU]/mL — ABNORMAL HIGH (ref 0.350–4.500)

## 2018-06-01 LAB — URINALYSIS, ROUTINE W REFLEX MICROSCOPIC
BILIRUBIN URINE: NEGATIVE
Glucose, UA: NEGATIVE mg/dL
Hgb urine dipstick: NEGATIVE
Ketones, ur: NEGATIVE mg/dL
Leukocytes, UA: NEGATIVE
Nitrite: NEGATIVE
Protein, ur: NEGATIVE mg/dL
SPECIFIC GRAVITY, URINE: 1.003 — AB (ref 1.005–1.030)
pH: 7 (ref 5.0–8.0)

## 2018-06-01 LAB — ETHANOL: ALCOHOL ETHYL (B): 255 mg/dL — AB (ref <10)

## 2018-06-01 LAB — AMMONIA: AMMONIA: 60 umol/L — AB (ref 9–35)

## 2018-06-01 LAB — RAPID URINE DRUG SCREEN, HOSP PERFORMED
AMPHETAMINES: NOT DETECTED
Benzodiazepines: NOT DETECTED
COCAINE: NOT DETECTED
Opiates: NOT DETECTED
TETRAHYDROCANNABINOL: NOT DETECTED

## 2018-06-01 LAB — VITAMIN B12: VITAMIN B 12: 1889 pg/mL — AB (ref 180–914)

## 2018-06-01 LAB — LIPASE, BLOOD: Lipase: 58 U/L — ABNORMAL HIGH (ref 11–51)

## 2018-06-01 LAB — T4, FREE: Free T4: 0.97 ng/dL (ref 0.82–1.77)

## 2018-06-01 LAB — HEMOGLOBIN A1C
Hgb A1c MFr Bld: 5.5 % (ref 4.8–5.6)
MEAN PLASMA GLUCOSE: 111.15 mg/dL

## 2018-06-01 LAB — BRAIN NATRIURETIC PEPTIDE: B Natriuretic Peptide: 63 pg/mL (ref 0.0–100.0)

## 2018-06-01 LAB — FOLATE: FOLATE: 4 ng/mL — AB (ref >5.9)

## 2018-06-01 LAB — MAGNESIUM: Magnesium: 1.5 mg/dL — ABNORMAL LOW (ref 1.7–2.4)

## 2018-06-01 MED ORDER — IPRATROPIUM-ALBUTEROL 0.5-2.5 (3) MG/3ML IN SOLN
3.0000 mL | Freq: Four times a day (QID) | RESPIRATORY_TRACT | Status: DC
  Administered 2018-06-01 (×2): 3 mL via RESPIRATORY_TRACT
  Filled 2018-06-01 (×2): qty 3

## 2018-06-01 MED ORDER — LORAZEPAM 2 MG/ML IJ SOLN
1.0000 mg | Freq: Once | INTRAMUSCULAR | Status: AC
  Administered 2018-06-01: 1 mg via INTRAVENOUS
  Filled 2018-06-01: qty 1

## 2018-06-01 MED ORDER — ONDANSETRON HCL 4 MG/2ML IJ SOLN
4.0000 mg | Freq: Once | INTRAMUSCULAR | Status: AC | PRN
  Administered 2018-06-01: 4 mg via INTRAVENOUS
  Filled 2018-06-01: qty 2

## 2018-06-01 MED ORDER — KETOROLAC TROMETHAMINE 30 MG/ML IJ SOLN
30.0000 mg | Freq: Once | INTRAMUSCULAR | Status: AC
  Administered 2018-06-01: 30 mg via INTRAVENOUS
  Filled 2018-06-01: qty 1

## 2018-06-01 MED ORDER — VITAMIN B-12 1000 MCG PO TABS
1000.0000 ug | ORAL_TABLET | Freq: Every day | ORAL | Status: DC
  Administered 2018-06-01 – 2018-06-09 (×9): 1000 ug via ORAL
  Filled 2018-06-01 (×9): qty 1

## 2018-06-01 MED ORDER — PANTOPRAZOLE SODIUM 40 MG PO TBEC
40.0000 mg | DELAYED_RELEASE_TABLET | Freq: Every day | ORAL | Status: DC
  Administered 2018-06-01 – 2018-06-09 (×9): 40 mg via ORAL
  Filled 2018-06-01 (×9): qty 1

## 2018-06-01 MED ORDER — LORAZEPAM 2 MG/ML IJ SOLN
1.0000 mg | Freq: Four times a day (QID) | INTRAMUSCULAR | Status: DC | PRN
  Administered 2018-06-01 – 2018-06-02 (×3): 1 mg via INTRAVENOUS
  Filled 2018-06-01 (×3): qty 1

## 2018-06-01 MED ORDER — ALLOPURINOL 100 MG PO TABS
100.0000 mg | ORAL_TABLET | Freq: Every day | ORAL | Status: DC
  Administered 2018-06-01 – 2018-06-09 (×9): 100 mg via ORAL
  Filled 2018-06-01 (×9): qty 1

## 2018-06-01 MED ORDER — SODIUM CHLORIDE 0.9 % IV BOLUS
1000.0000 mL | Freq: Once | INTRAVENOUS | Status: AC
  Administered 2018-06-01: 1000 mL via INTRAVENOUS

## 2018-06-01 MED ORDER — KETOROLAC TROMETHAMINE 30 MG/ML IJ SOLN
30.0000 mg | Freq: Four times a day (QID) | INTRAMUSCULAR | Status: DC | PRN
  Administered 2018-06-01 – 2018-06-03 (×6): 30 mg via INTRAVENOUS
  Filled 2018-06-01 (×6): qty 1

## 2018-06-01 MED ORDER — PNEUMOCOCCAL VAC POLYVALENT 25 MCG/0.5ML IJ INJ
0.5000 mL | INJECTION | INTRAMUSCULAR | Status: DC
  Filled 2018-06-01: qty 0.5

## 2018-06-01 MED ORDER — ACETAMINOPHEN 650 MG RE SUPP: 650.0000 mg | Freq: Four times a day (QID) | RECTAL | Status: DC | PRN

## 2018-06-01 MED ORDER — LACTULOSE 10 GM/15ML PO SOLN
30.0000 g | Freq: Three times a day (TID) | ORAL | Status: DC
  Administered 2018-06-01 – 2018-06-03 (×6): 30 g via ORAL
  Filled 2018-06-01 (×7): qty 60

## 2018-06-01 MED ORDER — NICOTINE 21 MG/24HR TD PT24: 21.0000 mg | MEDICATED_PATCH | Freq: Every day | TRANSDERMAL | Status: DC

## 2018-06-01 MED ORDER — NICOTINE 14 MG/24HR TD PT24
14.0000 mg | MEDICATED_PATCH | Freq: Once | TRANSDERMAL | Status: DC
  Administered 2018-06-01: 14 mg via TRANSDERMAL
  Filled 2018-06-01: qty 1

## 2018-06-01 MED ORDER — BUDESONIDE 0.5 MG/2ML IN SUSP
0.5000 mg | Freq: Two times a day (BID) | RESPIRATORY_TRACT | Status: DC
  Administered 2018-06-01 – 2018-06-09 (×15): 0.5 mg via RESPIRATORY_TRACT
  Filled 2018-06-01 (×15): qty 2

## 2018-06-01 MED ORDER — NICOTINE 21 MG/24HR TD PT24
21.0000 mg | MEDICATED_PATCH | Freq: Every day | TRANSDERMAL | Status: DC
  Administered 2018-06-02 – 2018-06-09 (×8): 21 mg via TRANSDERMAL
  Filled 2018-06-01 (×8): qty 1

## 2018-06-01 MED ORDER — GABAPENTIN 400 MG PO CAPS
400.0000 mg | ORAL_CAPSULE | Freq: Three times a day (TID) | ORAL | Status: DC
  Administered 2018-06-01 – 2018-06-09 (×18): 400 mg via ORAL
  Filled 2018-06-01 (×21): qty 1

## 2018-06-01 MED ORDER — ADULT MULTIVITAMIN W/MINERALS CH
1.0000 | ORAL_TABLET | Freq: Every day | ORAL | Status: DC
  Administered 2018-06-01 – 2018-06-09 (×9): 1 via ORAL
  Filled 2018-06-01 (×9): qty 1

## 2018-06-01 MED ORDER — MORPHINE SULFATE (PF) 2 MG/ML IV SOLN
2.0000 mg | Freq: Once | INTRAVENOUS | Status: AC
  Administered 2018-06-01: 2 mg via INTRAVENOUS
  Filled 2018-06-01: qty 1

## 2018-06-01 MED ORDER — FAMOTIDINE IN NACL 20-0.9 MG/50ML-% IV SOLN
20.0000 mg | Freq: Once | INTRAVENOUS | Status: AC
  Administered 2018-06-01: 20 mg via INTRAVENOUS
  Filled 2018-06-01: qty 50

## 2018-06-01 MED ORDER — POTASSIUM CHLORIDE IN NACL 20-0.9 MEQ/L-% IV SOLN
INTRAVENOUS | Status: DC
Start: 2018-06-01 — End: 2018-06-03
  Administered 2018-06-01 – 2018-06-02 (×3): via INTRAVENOUS

## 2018-06-01 MED ORDER — OMEGA-3-ACID ETHYL ESTERS 1 G PO CAPS
2.0000 g | ORAL_CAPSULE | Freq: Every day | ORAL | Status: DC
  Administered 2018-06-01 – 2018-06-09 (×9): 2 g via ORAL
  Filled 2018-06-01 (×9): qty 2

## 2018-06-01 MED ORDER — LACTULOSE 10 GM/15ML PO SOLN
30.0000 g | Freq: Once | ORAL | Status: AC
  Administered 2018-06-01: 20 g via ORAL
  Filled 2018-06-01: qty 60

## 2018-06-01 MED ORDER — NICOTINE 21 MG/24HR TD PT24
21.0000 mg | MEDICATED_PATCH | Freq: Once | TRANSDERMAL | Status: AC
  Administered 2018-06-01: 21 mg via TRANSDERMAL
  Filled 2018-06-01: qty 1

## 2018-06-01 MED ORDER — ACETAMINOPHEN 325 MG PO TABS
650.0000 mg | ORAL_TABLET | Freq: Four times a day (QID) | ORAL | Status: DC | PRN
  Administered 2018-06-02: 650 mg via ORAL
  Filled 2018-06-01: qty 2

## 2018-06-01 MED ORDER — FOLIC ACID 1 MG PO TABS: 1.0000 mg | ORAL_TABLET | Freq: Every day | ORAL | Status: DC

## 2018-06-01 MED ORDER — IPRATROPIUM-ALBUTEROL 0.5-2.5 (3) MG/3ML IN SOLN
3.0000 mL | Freq: Three times a day (TID) | RESPIRATORY_TRACT | Status: DC
  Administered 2018-06-02 – 2018-06-05 (×10): 3 mL via RESPIRATORY_TRACT
  Filled 2018-06-01 (×10): qty 3

## 2018-06-01 MED ORDER — LORAZEPAM 2 MG/ML IJ SOLN
0.5000 mg | Freq: Once | INTRAMUSCULAR | Status: AC
  Administered 2018-06-01: 0.5 mg via INTRAVENOUS
  Filled 2018-06-01: qty 1

## 2018-06-01 MED ORDER — FOLIC ACID 1 MG PO TABS
1.0000 mg | ORAL_TABLET | Freq: Every day | ORAL | Status: DC
  Administered 2018-06-01 – 2018-06-09 (×9): 1 mg via ORAL
  Filled 2018-06-01 (×9): qty 1

## 2018-06-01 MED ORDER — ONDANSETRON HCL 4 MG PO TABS: 4.0000 mg | ORAL_TABLET | Freq: Four times a day (QID) | ORAL | Status: DC | PRN

## 2018-06-01 MED ORDER — NALTREXONE HCL 50 MG PO TABS
50.0000 mg | ORAL_TABLET | Freq: Every day | ORAL | Status: DC
  Administered 2018-06-01 – 2018-06-08 (×8): 50 mg via ORAL
  Filled 2018-06-01 (×10): qty 1

## 2018-06-01 MED ORDER — ONDANSETRON HCL 4 MG/2ML IJ SOLN
4.0000 mg | Freq: Four times a day (QID) | INTRAMUSCULAR | Status: DC | PRN
  Administered 2018-06-05: 4 mg via INTRAVENOUS
  Filled 2018-06-01: qty 2

## 2018-06-01 MED ORDER — THIAMINE HCL 100 MG/ML IJ SOLN: 100.0000 mg | Freq: Every day | INTRAMUSCULAR | Status: DC

## 2018-06-01 MED ORDER — METHYLPREDNISOLONE SODIUM SUCC 125 MG IJ SOLR
60.0000 mg | Freq: Four times a day (QID) | INTRAMUSCULAR | Status: DC
  Administered 2018-06-01 – 2018-06-02 (×5): 60 mg via INTRAVENOUS
  Filled 2018-06-01 (×6): qty 2

## 2018-06-01 MED ORDER — LORAZEPAM 1 MG PO TABS
1.0000 mg | ORAL_TABLET | Freq: Four times a day (QID) | ORAL | Status: DC | PRN
  Administered 2018-06-02: 1 mg via ORAL
  Filled 2018-06-01: qty 1

## 2018-06-01 MED ORDER — VITAMIN B-1 100 MG PO TABS
100.0000 mg | ORAL_TABLET | Freq: Every day | ORAL | Status: DC
  Administered 2018-06-01 – 2018-06-09 (×9): 100 mg via ORAL
  Filled 2018-06-01 (×9): qty 1

## 2018-06-01 NOTE — H&P (Signed)
History and Physical  Bradley Miles ZYY:[id number] DOB: 07-Sep-1966 DOA: 06/01/2018   PCP: [name], MD   Patient coming from: Home  Chief Complaint: abdominal pain and vomiting  HPI:  Bradley Miles is a 52 y.o. male with medical history of alcohol abuse, presumptive COPD, peripheral neuropathy, hypertension presenting with approximately 3-week history of nausea, vomiting, abdominal pain.  The patient has a history of alcohol dependence and has had relapse in his alcohol use despite previous rehabilitation stays.  The patient has been drinking 2/3 gallon of whiskey daily with his last drink in am 6/23 prior to arrival to ED. nevertheless, the patient has just returned from vacation from [place] despite his abdominal pain, nausea, and vomiting.  The patient denies any fevers, chills, headache, neck pain.  He has loose stools and in the past 2 to 3 days has noticed some hematochezia.  He denies any melena or hematemesis.  He has been having increasing abdominal girth, lower extremity edema, and dyspnea on exertion.  He has had some orthopnea type symptoms prompting him to sleep in a recliner intermittently.  His primary care provider ordered a CT of the abdomen and pelvis approximately 1 week prior to this admission.  The report is not available, but the family states that it showed fatty liver without obstruction and a negative gallbladder.  In addition, his significant other has noted that the patient has been more somnolent in the past 2 to 3 days.  In the emergency department, the patient was afebrile and hemodynamically stable albeit with soft blood pressure 107/73.  Oxygen saturation was fluctuating with desaturations while the patient was asleep.  BMP was 126 with potassium 3.3.  AST 125, ALT 41, alk phosphatase 151, total bilirubin 3.1, ammonia 60, lipase 58.  CBC showed normal cytopenia with hemoglobin 13.5.  INR was 1.23.  Urinalysis and urine drug screen was negative.  Alcohol  level was 255.  Chest x-ray was negative for any acute findings.  The patient was given Ativan, Pepcid, lactulose, and Zofran and bolused 1 L.  Assessment/Plan: Hepatic encephalopathy -Start lactulose -Trend ammonia  Decompensated liver cirrhosis -Abdominal ultrasound due to the patient's abdominal pain and to help assess for ascites -Try to obtain CT abdomen report from outside facility -Trend LFTs  Alcohol abuse -Alcohol withdrawal protocol  Acute respiratory failure with hypoxia -Multifactorial including COPD exacerbation, OHS/OSA, and hypoventilation -Stable on 2 L -Wean oxygen as tolerated  -Echocardiogram  COPD exacerbation -Start Pulmicort -Start duo nebs -Start IV Solu-Medrol  Hyponatremia -Likely due to volume depletion in the setting of likely alcoholic liver cirrhosis -Judicious IV hydration -A.m. 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  I have counseled the patient regarding the negative impacts of continued tobacco use including but not limited to lung cancer, COPD, and cardiovascular disease.  I have discussed alternatives to tobacco and modalities that may help facilitate tobacco cessation including but not limited to biofeedback, hypnosis, and medications.  Total time spent with tobacco counseling was 4 minutes.         Past Medical History:  Diagnosis Date  . Anxiety   . Enlarged  liver   . GERD (gastroesophageal reflux disease)   . Hypertension   . Palpitations   . PVC's (premature ventricular contractions)   . Shortness of breath     . Varicose veins    Past Surgical History:  Procedure Laterality Date  . COLONOSCOPY WITH ESOPHAGOGASTRODUODENOSCOPY (EGD)  12/16/2012   Procedure: COLONOSCOPY WITH ESOPHAGOGASTRODUODENOSCOPY (EGD);  Surgeon: Daneil Dolin, MD;  [facility];  Service: Endoscopy;  Laterality: N/A;  7:30  . cyst reomved     from spine  . ESOPHAGOGASTRODUODENOSCOPY  05/19/09   RMR: Geographic distal esophageal erosions consistent with severe erosive reflux esophagitis. schatzki's ring s/P dilation/small hiatal hernia otherwise normal stomach  . WISDOM TOOTH EXTRACTION     Social History:  reports that he has been smoking cigarettes.  He started smoking about 38 years ago. He has a 45.00 pack-year smoking history. He has never used smokeless tobacco. He reports that he drinks alcohol. He reports that he does not use drugs.   Family History  Problem Relation Age of Onset  . Cancer Mother        ?etiology  . Heart disease Mother   . Colon polyps Sister 28       two sisters  . Liver disease Neg Hx      No Known Allergies   Prior to Admission medications   Medication Sig Start Date End Date Taking? Authorizing Provider  acebutolol (SECTRAL) 400 MG capsule TAKE 1 CAPSULE BY MOUTH TWICE A DAY 03/31/18   [name], MD  albuterol (PROVENTIL HFA;VENTOLIN HFA) 108 (90 Base) MCG/ACT inhaler Inhale 2 puffs into the lungs every 6 (six) hours as needed for wheezing or shortness of breath. 05/14/17   [name], MD  allopurinol (ZYLOPRIM) 100 MG tablet TAKE 1 TABLET BY MOUTH ONCE DAILY 03/31/18   [name], MD  amoxicillin-clavulanate (AUGMENTIN) 875-125 MG tablet Take 1 tablet by mouth every 12 (twelve) hours. 01/06/18   Kirichenko, Lahoma Rocker, PA-C  aspirin 81 MG chewable tablet Chew 81 mg by mouth daily.     [provider]  colchicine 0.6 MG tablet take 1 tablet by mouth once daily Patient taking differently: take 1 tablet by mouth once daily as needed 06/21/17   [name], MD  dexlansoprazole (DEXILANT) 60 MG capsule Take 1 capsule (60 mg total) by mouth daily. 04/15/17   [name], MD  enalapril (VASOTEC) 10 MG tablet Take 1 tablet (10 mg total) by mouth daily. 04/07/18   [name], MD  folic acid (FOLVITE) 1 MG tablet Take 1 mg by mouth daily.    [provider]  gabapentin (NEURONTIN) 300 MG capsule TAKE ONE CAPSULE BY MOUTH FOUR TIMES DAILY 03/31/18   [name], MD  ibuprofen (ADVIL,MOTRIN) 200 MG tablet Take 600 mg by mouth every 6 (six) hours as needed for headache.     [provider]  naltrexone (DEPADE) 50 MG tablet Take 1 tablet (50 mg total) by mouth at bedtime. 11/25/17   [name], MD  Omega-3 Fatty Acids (FISH OIL PO) Take 3 capsules by mouth daily.    [provider]  ondansetron (ZOFRAN ODT) 4 MG disintegrating tablet One po Q 8 hours prn nausea 05/22/18   [name], MD  sucralfate (CARAFATE) 1 g tablet One po Ac 05/22/18   [name], MD  traZODone (DESYREL) 50 MG tablet take 1 tablet by mouth at bedtime if needed for insomnia 08/21/17  [provider]  valACYclovir (VALTREX) 1000 MG tablet 2 tablets BID for 1 day 03/15/16   [name], MD  VIAGRA 100 MG tablet take 1/2 tablet by mouth once daily 12/04/17   [name], MD  vitamin B-12 (CYANOCOBALAMIN) 1000 MCG tablet Take 1,000 mcg by mouth daily.    [provider]    Review of Systems:  Constitutional:  No weight loss, night sweats, Head&Eyes: No headache.  No vision loss.  No eye pain or scotoma ENT:  No Difficulty swallowing,Tooth/dental problems,Sore throat,  No ear ache, post nasal drip,  Cardio-vascular:  No chest pain, Orthopnea, PND, swelling in lower extremities,  dizziness, palpitations  GI:  No  diarrhea, loss of appetite, hematochezia, melena, heartburn, indigestion, Resp:  No. No cough. No coughing up of blood .No wheezing.No chest wall deformity  Skin:  no rash or  lesions.  GU:  no dysuria, change in color of urine, no urgency or frequency. No flank pain.  Musculoskeletal:  NoNo decreased range of motion. No back pain.  Psych:  No change in mood or affect.  Neurologic: No headache, no focal weakness, no vision loss. No syncope  Physical Exam: Vitals:   06/01/18 0726  BP: 107/73  Pulse: 92  Resp: 16  Temp: 97.7 F (36.5 C)  TempSrc: Oral  SpO2: 95%  Weight: 117.9 kg (260 lb)  Height: 6' 1"  (1.854 m)   General:  A&O x 2, NAD, nontoxic, pleasant/cooperative Head/Eye: No conjunctival hemorrhage, no icterus, [place]/AT, No nystagmus ENT:  No icterus,  No thrush, good dentition, no pharyngeal exudate Neck:  No masses, no lymphadenpathy, no bruits CV:  RRR, no rub, no gallop, no S3 Lung: Bilateral scattered rales.  Bibasilar wheeze.  Good air movement. Abdomen: soft/ RUQ tender, +BS, mildly distended, no peritoneal signs Ext: No cyanosis, No rashes, No petechiae, No lymphangitis, No edema Neuro: CNII-XII intact, strength 4/5 in bilateral upper and lower extremities, no dysmetria  Labs on Admission:  Basic Metabolic Panel: Recent Labs  Lab 06/01/18 0745  NA 126*  K 3.3*  CL 88*  CO2 27  GLUCOSE 164*  BUN 5*  CREATININE 0.63  CALCIUM 7.9*   Liver Function Tests: Recent Labs  Lab 06/01/18 0745  AST 125*  ALT 41  ALKPHOS 151*  BILITOT 3.1*  PROT 6.0*  ALBUMIN 2.6*   Recent Labs  Lab 06/01/18 0745  LIPASE 58*   Recent Labs  Lab 06/01/18 0831  AMMONIA 60*   CBC: Recent Labs  Lab 06/01/18 0745  WBC 6.5  HGB 13.5  HCT 38.6*  MCV 111.6*  PLT 135*   Coagulation Profile: Recent Labs  Lab 06/01/18 0831  INR 1.23   Cardiac Enzymes: No results for input(s): CKTOTAL, CKMB, CKMBINDEX, TROPONINI in the last 168 hours. BNP: Invalid input(s): POCBNP CBG: No results for input(s): GLUCAP in the last 168 hours. Urine analysis:    Component Value Date/Time   COLORURINE YELLOW 06/01/2018 0730   APPEARANCEUR CLEAR  06/01/2018 0730   LABSPEC 1.003 (L) 06/01/2018 0730   PHURINE 7.0 06/01/2018 0730   GLUCOSEU NEGATIVE 06/01/2018 0730   HGBUR NEGATIVE 06/01/2018 0730   BILIRUBINUR NEGATIVE 06/01/2018 0730   KETONESUR NEGATIVE 06/01/2018 0730   PROTEINUR NEGATIVE 06/01/2018 0730   NITRITE NEGATIVE 06/01/2018 0730   LEUKOCYTESUR NEGATIVE 06/01/2018 0730   Sepsis Labs: @LABRCNTIP (procalcitonin:4,lacticidven:4) )No results found for this or any previous visit (from the past 240 hour(s)).   Radiological Exams on Admission: Dg Chest 2 View  Result  Date: 06/01/2018 CLINICAL DATA:  Cough with dyspnea and right upper quadrant pain. EXAM: CHEST - 2 VIEW COMPARISON:  01/06/2018 FINDINGS: Lungs are adequately inflated without focal consolidation or effusion. Cardiomediastinal silhouette and remainder the exam is unchanged. IMPRESSION: No active cardiopulmonary disease. Electronically Signed   By: Marin Olp M.D.   On: 06/01/2018 09:21    EKG: Independently reviewed. pending    Time spent:70 minutes Code Status:   FULL Family Communication:  Sister and significant other at bedside Disposition Plan: expect 2-3 day hospitalization Consults called: none DVT Prophylaxis:SCDs  Orson Eva, DO  [facility] Pager [phone]  If 7PM-7AM, please contact night-coverage [email or web address] Password [facility] 06/01/2018, 10:52 AM

## 2018-06-01 NOTE — ED Triage Notes (Signed)
Pt states that he dr wanted to admit him last week. Pt is having right side abd pain with swelling in his abd and ankles. He has not been keeping anything down.

## 2018-06-01 NOTE — ED Provider Notes (Signed)
[facility] EMERGENCY DEPARTMENT Provider Note   CSN: [id number] Arrival date & time: 06/01/18  0709     History   Chief Complaint Chief Complaint  Patient presents with  . Abdominal Pain    HPI Bradley Miles is a 52 y.o. male.  HPI  52 yo male with alcohol abuse disorder, recently diagnosed with liver failure from cirrhosis by Dr. Wolfgang Phoenix and referred to Dr. Sydell Axon.  Over the past two has worsening ruq pain worse in morning, but takes first alchol drink within one hour of getting up.  Now with vomiting mostly yellow, no blood, vomits mostly in am and with food, but drinking alcohol.  Associated cough.  Abdomen distended.  Has not seen Dr.Rourke had scan done (CT)- at [facility].  Dr. Malachy Moan nurse called and told enlarged fatty liver c.w. Cirrhosis, no stone or duct blockage 9 days ago. Today pain is worse symtpom, has not vomited today, has had am drink  Past Medical History:  Diagnosis Date  . Anxiety   . Enlarged liver   . GERD (gastroesophageal reflux disease)   . Hypertension   . Palpitations   . PVC's (premature ventricular contractions)   . Shortness of breath   . Varicose veins     Patient Active Problem List   Diagnosis Date Noted  . COPD, mild ([place]) 05/14/2017  . Right leg claudication ([place]) 05/14/2017  . Prediabetes 02/04/2017  . Chest pain 12/21/2015  . Right leg DVT ([place]) 07/18/2015  . Elevated transaminase measurement 07/18/2015  . Sensory neuropathy 07/16/2015  . Anxiety 12/23/2014  . EtOH dependence ([place]) 02/09/2014  . Headache in back of head 01/12/2014  . Superficial thrombosis of lower extremity 07/01/2013  . Hematochezia 11/17/2012  . Mixed hyperlipidemia 02/15/2012  . Hypertension 01/18/2012  . Insomnia 01/18/2012  . Family history of colonic polyps 11/15/2011  . Palpitations 10/04/2011  . TOBACCO ABUSE 05/18/2010  . GERD 05/18/2010  . CONSTIPATION 05/18/2010  . 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 DYSPHAGIA UNSPECIFIED  05/18/2010  . ABDOMINAL PAIN, RIGHT LOWER QUADRANT 05/18/2010    Past Surgical History:  Procedure Laterality Date  . COLONOSCOPY WITH ESOPHAGOGASTRODUODENOSCOPY (EGD)  12/16/2012   Procedure: COLONOSCOPY WITH ESOPHAGOGASTRODUODENOSCOPY (EGD);  Surgeon: Daneil Dolin, MD;  Location: [facility];  Service: Endoscopy;  Laterality: N/A;  7:30  . cyst reomved     from spine  . ESOPHAGOGASTRODUODENOSCOPY  05/19/09   RMR: Geographic distal esophageal erosions consistent with severe erosive reflux esophagitis. schatzki's ring s/P dilation/small hiatal hernia otherwise normal stomach  . WISDOM TOOTH EXTRACTION          Home Medications    Prior to Admission medications   Medication Sig Start Date End Date Taking? Authorizing Provider  acebutolol (SECTRAL) 400 MG capsule TAKE 1 CAPSULE BY MOUTH TWICE A DAY 03/31/18   [name], MD  albuterol (PROVENTIL HFA;VENTOLIN HFA) 108 (90 Base) MCG/ACT inhaler Inhale 2 puffs into the lungs every 6 (six) hours as needed for wheezing or shortness of breath. 05/14/17   [name], MD  allopurinol (ZYLOPRIM) 100 MG tablet TAKE 1 TABLET BY MOUTH ONCE DAILY 03/31/18   [name], MD  amoxicillin-clavulanate (AUGMENTIN) 875-125 MG tablet Take 1 tablet by mouth every 12 (twelve) hours. 01/06/18   Kirichenko, Lahoma Rocker, PA-C  aspirin 81 MG chewable tablet Chew 81 mg by mouth daily.     [provider]  colchicine 0.6 MG tablet take 1 tablet by mouth once daily Patient taking differently:  take 1 tablet by mouth once daily as needed 06/21/17   [name], MD  dexlansoprazole (DEXILANT) 60 MG capsule Take 1 capsule (60 mg total) by mouth daily. 04/15/17   [name], MD  enalapril (VASOTEC) 10 MG tablet Take 1 tablet (10 mg total) by mouth daily. 04/07/18   [name], MD  folic acid (FOLVITE) 1 MG tablet Take 1 mg by mouth daily.    [provider]  gabapentin (NEURONTIN) 300 MG capsule TAKE ONE CAPSULE BY MOUTH  FOUR TIMES DAILY 03/31/18   [name], MD  ibuprofen (ADVIL,MOTRIN) 200 MG tablet Take 600 mg by mouth every 6 (six) hours as needed for headache.     [provider]  naltrexone (DEPADE) 50 MG tablet Take 1 tablet (50 mg total) by mouth at bedtime. 11/25/17   [name], MD  Omega-3 Fatty Acids (FISH OIL PO) Take 3 capsules by mouth daily.    [provider]  ondansetron (ZOFRAN ODT) 4 MG disintegrating tablet One po Q 8 hours prn nausea 05/22/18   [name], MD  sucralfate (CARAFATE) 1 g tablet One po Ac 05/22/18   [name], MD  traZODone (DESYREL) 50 MG tablet take 1 tablet by mouth at bedtime if needed for insomnia 08/21/17   [provider]  valACYclovir (VALTREX) 1000 MG tablet 2 tablets BID for 1 day 03/15/16   [name], MD  VIAGRA 100 MG tablet take 1/2 tablet by mouth once daily 12/04/17   [name], MD  vitamin B-12 (CYANOCOBALAMIN) 1000 MCG tablet Take 1,000 mcg by mouth daily.    [provider]    Family History Family History  Problem Relation Age of Onset  . Cancer Mother        ?etiology  . Heart disease Mother   . Colon polyps Sister 27       two sisters  . Liver disease Neg Hx     Social History Social History   Tobacco Use  . Smoking status: Current Every Day Smoker    Packs/day: 1.50    Years: 30.00    Pack years: 45.00    Types: Cigarettes    Start date: 12/24/1979  . Smokeless tobacco: Never Used  Substance Use Topics  . Alcohol use: Yes    Alcohol/week: 0.0 oz    Comment: consumes beer or liquor three to four times weekly. Up to six pack or three shots each occurence or more x 20+ yrs  . Drug use: No     Allergies   Patient has no known allergies.   Review of Systems Review of Systems  All other systems reviewed and are negative.    Physical Exam Updated Vital Signs BP 107/73 (BP Location: Right Arm)   Pulse 92   Temp 97.7 F (36.5 C) (Oral)   Resp 16   Ht  1.854 m (6\' 1" )   Wt 117.9 kg (260 lb)   SpO2 95%   BMI 34.30 kg/m   Physical Exam  Constitutional: He is oriented to person, place, and time. He appears well-developed and well-nourished.  HENT:  Head: Normocephalic and atraumatic.  Mouth/Throat: Oropharynx is clear and moist.  Eyes: Pupils are equal, round, and reactive to light. EOM are normal.  Cardiovascular: Normal rate, regular rhythm and normal heart sounds.  Pulmonary/Chest: Effort normal.  Abdominal: Normal appearance and bowel sounds are normal. There is tenderness in the right upper quadrant.  Mild ttp  ruq  Neurological: He is alert and oriented to person, place, and time.  Skin: Skin is warm and dry. Capillary refill takes less than 2 seconds.  Psychiatric: He has a normal mood and affect. His behavior is normal.  Nursing note and vitals reviewed.    ED Treatments / Results  Labs (all labs ordered are listed, but only abnormal results are displayed) Labs Reviewed  LIPASE, BLOOD  COMPREHENSIVE METABOLIC PANEL  CBC  URINALYSIS, ROUTINE W REFLEX MICROSCOPIC    EKG None  Radiology No results found.  Procedures Procedures (including critical care time)  Medications Ordered in ED Medications  ondansetron (ZOFRAN) injection 4 mg (has no administration in time range)     Initial Impression / Assessment and Plan / ED Course  I have reviewed the triage vital signs and the nursing notes.  Pertinent labs & imaging results that were available during my care of the patient were reviewed by me and considered in my medical decision making (see chart for details).     52 yo male with alcohol induced liver failure presents today with nausea, vomiting, ruq pain, some decreased ms now with elevated ammonia, and ongoing etoh abuse.  Patient with decreasing ability to tolerate po alcohol.  History of detox times two with need for meds Here given ativan and no s/s w/d at this time Plan lactulose and protonix for  possible gastritis with associated nausea and vomiting Hyponatremia- likely associated with etoh use but new from last labs  Patient reports recent op ct [place] 9 days ago Dr. Wolfgang Phoenix told patient enlarged liver, no stones Vitals:   06/01/18 0726  BP: 107/73  Pulse: 92  Resp: 16  Temp: 97.7 F (36.5 C)  SpO2: 95%    Liver failure with hyperammonemia Alcohol abuse/withdrawal 6/13 Abdominal pain  Discussed with Dr. Carles Collet and will see for admission Final Clinical Impressions(s) / ED Diagnoses   Final diagnoses:  Liver failure without hepatic coma, unspecified chronicity ([place])  Increased ammonia level  Right upper quadrant abdominal pain    ED Discharge Orders    None       Pattricia Boss, MD 06/01/18 1006

## 2018-06-02 ENCOUNTER — Inpatient Hospital Stay (HOSPITAL_COMMUNITY): Payer: Managed Care, Other (non HMO)

## 2018-06-02 DIAGNOSIS — R0600 Dyspnea, unspecified: Secondary | ICD-10-CM

## 2018-06-02 LAB — COMPREHENSIVE METABOLIC PANEL
ALT: 40 U/L (ref 17–63)
ANION GAP: 10 (ref 5–15)
AST: 118 U/L — ABNORMAL HIGH (ref 15–41)
Albumin: 2.5 g/dL — ABNORMAL LOW (ref 3.5–5.0)
Alkaline Phosphatase: 145 U/L — ABNORMAL HIGH (ref 38–126)
BUN: 5 mg/dL — ABNORMAL LOW (ref 6–20)
CO2: 26 mmol/L (ref 22–32)
CREATININE: 0.75 mg/dL (ref 0.61–1.24)
Calcium: 8.1 mg/dL — ABNORMAL LOW (ref 8.9–10.3)
Chloride: 96 mmol/L — ABNORMAL LOW (ref 101–111)
Glucose, Bld: 206 mg/dL — ABNORMAL HIGH (ref 65–99)
Potassium: 4.3 mmol/L (ref 3.5–5.1)
Sodium: 132 mmol/L — ABNORMAL LOW (ref 135–145)
TOTAL PROTEIN: 6 g/dL — AB (ref 6.5–8.1)
Total Bilirubin: 2.5 mg/dL — ABNORMAL HIGH (ref 0.3–1.2)

## 2018-06-02 LAB — CBC
HCT: 39.7 % (ref 39.0–52.0)
Hemoglobin: 13.7 g/dL (ref 13.0–17.0)
MCH: 39.1 pg — AB (ref 26.0–34.0)
MCHC: 34.5 g/dL (ref 30.0–36.0)
MCV: 113.4 fL — ABNORMAL HIGH (ref 78.0–100.0)
PLATELETS: 124 10*3/uL — AB (ref 150–400)
RBC: 3.5 MIL/uL — AB (ref 4.22–5.81)
RDW: 13.4 % (ref 11.5–15.5)
WBC: 5.3 10*3/uL (ref 4.0–10.5)

## 2018-06-02 LAB — PROTEIN / CREATININE RATIO, URINE
CREATININE, URINE: 85.36 mg/dL
Protein Creatinine Ratio: 0.08 mg/mg{Cre} (ref 0.00–0.15)
Total Protein, Urine: 7 mg/dL

## 2018-06-02 LAB — ECHOCARDIOGRAM COMPLETE
Height: 73 in
WEIGHTICAEL: 4160 [oz_av]

## 2018-06-02 LAB — AMMONIA: Ammonia: 49 umol/L — ABNORMAL HIGH (ref 9–35)

## 2018-06-02 LAB — OCCULT BLOOD X 1 CARD TO LAB, STOOL: FECAL OCCULT BLD: NEGATIVE

## 2018-06-02 MED ORDER — MAGNESIUM SULFATE 2 GM/50ML IV SOLN
2.0000 g | Freq: Once | INTRAVENOUS | Status: AC
  Administered 2018-06-02: 2 g via INTRAVENOUS
  Filled 2018-06-02: qty 50

## 2018-06-02 MED ORDER — LORAZEPAM 2 MG/ML IJ SOLN
1.0000 mg | INTRAMUSCULAR | Status: DC | PRN
  Administered 2018-06-02 – 2018-06-07 (×15): 1 mg via INTRAVENOUS
  Filled 2018-06-02 (×15): qty 1

## 2018-06-02 MED ORDER — ACEBUTOLOL HCL 200 MG PO CAPS
400.0000 mg | ORAL_CAPSULE | Freq: Two times a day (BID) | ORAL | Status: DC
  Administered 2018-06-02 – 2018-06-07 (×11): 400 mg via ORAL
  Administered 2018-06-08: 200 mg via ORAL
  Administered 2018-06-08 – 2018-06-09 (×2): 400 mg via ORAL
  Filled 2018-06-02: qty 1
  Filled 2018-06-02 (×6): qty 2
  Filled 2018-06-02: qty 1
  Filled 2018-06-02 (×2): qty 2
  Filled 2018-06-02 (×2): qty 1
  Filled 2018-06-02 (×11): qty 2

## 2018-06-02 MED ORDER — ACEBUTOLOL HCL 200 MG PO CAPS
ORAL_CAPSULE | ORAL | Status: AC
Start: 2018-06-02 — End: ?
  Filled 2018-06-02: qty 2

## 2018-06-02 MED ORDER — IOPAMIDOL (ISOVUE-300) INJECTION 61%
100.0000 mL | Freq: Once | INTRAVENOUS | Status: AC | PRN
  Administered 2018-06-02: 100 mL via INTRAVENOUS

## 2018-06-02 MED ORDER — METHYLPREDNISOLONE SODIUM SUCC 125 MG IJ SOLR
60.0000 mg | Freq: Two times a day (BID) | INTRAMUSCULAR | Status: DC
  Administered 2018-06-03 (×2): 60 mg via INTRAVENOUS
  Filled 2018-06-02 (×2): qty 2

## 2018-06-02 MED ORDER — LORAZEPAM 1 MG PO TABS
1.0000 mg | ORAL_TABLET | ORAL | Status: DC | PRN
  Administered 2018-06-04 – 2018-06-09 (×10): 1 mg via ORAL
  Filled 2018-06-02 (×10): qty 1

## 2018-06-02 NOTE — Progress Notes (Signed)
*  PRELIMINARY RESULTS* Echocardiogram 2D Echocardiogram has been performed.  Bradley Miles 06/02/2018, 12:02 PM

## 2018-06-02 NOTE — Progress Notes (Signed)
PROGRESS NOTE  Bradley Miles MMH:[id number] DOB: [birth year] DOA: 06/01/2018 PCP: [name], MD  Brief History:  52 y.o. male with medical history of alcohol abuse, presumptive COPD, peripheral neuropathy, hypertension presenting with approximately 3-week history of nausea, vomiting, abdominal pain.  The patient has a history of alcohol dependence and has had relapse in his alcohol use despite previous rehabilitation stays.  The patient has been drinking 2/3 gallon of whiskey daily with his last drink in am 6/23 prior to arrival to ED. nevertheless, the patient has just returned from vacation from [place] despite his abdominal pain, nausea, and vomiting.  The patient denies any fevers, chills, headache, neck pain. He has been complaining of sob and LE edema and increased abd girth. In the emergency department, the patient was afebrile and hemodynamically stable albeit with soft blood pressure 107/73.  Oxygen saturation was fluctuating with desaturations while the patient was asleep.  BMP was 126 with potassium 3.3.  AST 125, ALT 41, alk phosphatase 151, total bilirubin 3.1, ammonia 60, lipase 58.  CBC showed normal cytopenia with hemoglobin 13.5.  INR was 1.23.  Urinalysis and urine drug screen was negative.  Alcohol level was 255.  Chest x-ray was negative for any acute findings.  The patient was given Ativan, Pepcid, lactulose, and Zofran and bolused 1 L.   Assessment/Plan: Hepatic encephalopathy -Continue lactulose -Trend ammonia 60>>>49 -mentation improving  Decompensated liver cirrhosis/Abdominal pain -Abdominal ultrasound--cholelithiasis with sludge without gallbladder wall thickening.  Hepatic steatosis.  No hydronephrosis. -CT abd/pelvis -Trend LFTs   Alcohol abuse -Alcohol withdrawal protocol   Acute respiratory failure with hypoxia -Multifactorial including COPD exacerbation, OHS/OSA, and hypoventilation -Stable on 2 L>> weaned to room air    -Echocardiogram--EF 60 to 65%, no WMA, trivial TR  COPD exacerbation -Continue Pulmicort -Continue duo nebs -Continue IV Solu-Medrol  Hyponatremia -Likely due to volume depletion in the setting of likely alcoholic liver cirrhosis -Judicious IV hydration -A.m. 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 -Check FOBT--neg -Trend hemoglobin  Tobacco abuse -NicoDerm patch I have discussed tobacco cessation with the patient.  I have counseled the patient regarding the negative impacts of continued tobacco use including but not limited to lung cancer, COPD, and cardiovascular disease.  I have discussed alternatives to tobacco and modalities that may help facilitate tobacco cessation including but not limited to biofeedback, hypnosis, and medications.  Total time spent with tobacco counseling was 4 minutes.    Disposition Plan:   Home in 1-2 days  Family Communication:   Sister update at bedside 6/24--Total time spent 35 minutes.  Greater than 50% spent face to face counseling and coordinating care.   Consultants:  nonr  Code Status:  FULL  DVT Prophylaxis:  SCDs   Procedures: As Listed in Progress Note Above  Antibiotics: None    Subjective: Patient states that he is breathing better but still has some dyspnea on exertion.  He denies any chest pain, nausea or vomiting, dysuria, hematuria.  He continues to have right upper quadrant pain.  He is hungry and wants to eat.  He denies any headache or neck pain.  He states that his  abdominal pain is moderate in nature with a dull  ache.  Objective: Vitals:   06/01/18 2308 06/02/18 0608 06/02/18 0700 06/02/18 1608  BP: (!) 141/80 (!) 156/98  (!) 144/84  Pulse: (!) 131 (!) 106  (!) 115  Resp: 18 18  18   Temp: 98.8 F (37.1 C) 97.6 F (36.4 C)  98.6 F (37 C)  TempSrc: Oral Oral    SpO2: 92% 94% 96% 94%  Weight:      Height:        Intake/Output Summary (Last 24 hours) at 06/02/2018 1719 Last data filed at 06/02/2018 1212 Gross per 24 hour  Intake 1227.5 ml  Output 1100 ml  Net 127.5 ml   Weight change:  Exam:   General:  Pt is alert, follows commands appropriately, not in acute distress  HEENT: No icterus, No thrush, No neck mass, [place]/AT  Cardiovascular: RRR, S1/S2, no rubs, no gallops  Respiratory: Bilateral scattered rales.  Minimal wheezing.  Good air movement  Abdomen: Soft/+BS, RUQ tender, non distended, no guarding  Extremities: 2+LE edema, No lymphangitis, No petechiae, No rashes, no synovitis   Data Reviewed: I have personally reviewed following labs and imaging studies Basic Metabolic Panel: Recent Labs  Lab 06/01/18 0745 06/01/18 0832 06/02/18 0451  NA 126*  --  132*  K 3.3*  --  4.3  CL 88*  --  96*  CO2 27  --  26  GLUCOSE 164*  --  206*  BUN 5*  --  5*  CREATININE 0.63  --  0.75  CALCIUM 7.9*  --  8.1*  MG  --  1.5*  --    Liver Function Tests: Recent Labs  Lab 06/01/18 0745 06/02/18 0451  AST 125* 118*  ALT 41 40  ALKPHOS 151* 145*  BILITOT 3.1* 2.5*  PROT 6.0* 6.0*  ALBUMIN 2.6* 2.5*   Recent Labs  Lab 06/01/18 0745  LIPASE 58*   Recent Labs  Lab 06/01/18 0831 06/02/18 0451  AMMONIA 60* 49*   Coagulation Profile: Recent Labs  Lab 06/01/18 0831  INR 1.23   CBC: Recent Labs  Lab 06/01/18 0745 06/02/18 0451  WBC 6.5 5.3  HGB 13.5 13.7  HCT 38.6* 39.7  MCV 111.6* 113.4*  PLT 135* 124*   Cardiac Enzymes: No results for input(s): CKTOTAL, CKMB, CKMBINDEX, TROPONINI in the last 168 hours. BNP: Invalid input(s): POCBNP CBG: No results  for input(s): GLUCAP in the last 168 hours. HbA1C: Recent Labs    06/01/18 0745  HGBA1C 5.5   Urine analysis:    Component Value Date/Time   COLORURINE YELLOW 06/01/2018 0730   APPEARANCEUR CLEAR 06/01/2018 0730   LABSPEC 1.003 (L) 06/01/2018 0730   PHURINE 7.0 06/01/2018 0730   GLUCOSEU NEGATIVE 06/01/2018 0730   HGBUR NEGATIVE 06/01/2018 0730   BILIRUBINUR NEGATIVE 06/01/2018 0730   KETONESUR NEGATIVE 06/01/2018 0730   PROTEINUR NEGATIVE 06/01/2018 0730   NITRITE NEGATIVE 06/01/2018 0730   LEUKOCYTESUR NEGATIVE 06/01/2018 0730   Sepsis Labs: @LABRCNTIP (procalcitonin:4,lacticidven:4) )No results found for this or any previous visit (from the past 240 hour(s)).   Scheduled Meds: . allopurinol  100 mg Oral Daily  . budesonide (PULMICORT) nebulizer solution  0.5 mg Nebulization BID  . folic acid  1 mg Oral Daily  . gabapentin  400 mg Oral TID  . ipratropium-albuterol  3 mL Nebulization TID  . lactulose  30 g Oral TID  . methylPREDNISolone (SOLU-MEDROL) injection  60 mg Intravenous Q6H  . multivitamin with minerals  1  tablet Oral Daily  . naltrexone  50 mg Oral QHS  . nicotine  21 mg Transdermal Daily  . nicotine  21 mg Transdermal Once  . omega-3 acid ethyl esters  2 g Oral Daily  . pantoprazole  40 mg Oral Daily  . pneumococcal 23 valent vaccine  0.5 mL Intramuscular Tomorrow-1000  . thiamine  100 mg Oral Daily   Or  . thiamine  100 mg Intravenous Daily  . vitamin B-12  1,000 mcg Oral Daily   Continuous Infusions: . 0.9 % NaCl with KCl 20 mEq / L 75 mL/hr at 06/02/18 4401    Procedures/Studies: Dg Chest 2 View  Result Date: 06/01/2018 CLINICAL DATA:  Cough with dyspnea and right upper quadrant pain. EXAM: CHEST - 2 VIEW COMPARISON:  01/06/2018 FINDINGS: Lungs are adequately inflated without focal consolidation or effusion. Cardiomediastinal silhouette and remainder the exam is unchanged. IMPRESSION: No active cardiopulmonary disease. Electronically Signed   By:  Marin Olp M.D.   On: 06/01/2018 09:21   US Abdomen Complete  Result Date: 06/02/2018 CLINICAL DATA:  Abdominal pain with nausea and vomiting for 3 weeks EXAM: ABDOMEN ULTRASOUND COMPLETE COMPARISON:  None. FINDINGS: Gallbladder: Mobile gallstones and sludge. No focal tenderness or wall thickening. Common bile duct: Diameter: 4 mm Liver: Echogenic from steatosis which decreases visualization of mid and deep parenchyma. Portal vein is patent on color Doppler imaging with normal direction of blood flow towards the liver (assuming standard Doppler scale on the prior images). IVC: No abnormality visualized. Pancreas: Visualized portion unremarkable. Spleen: Size and appearance within normal limits. Right Kidney: Length: 13 cm. Echogenicity within normal limits. No mass or hydronephrosis visualized. Left Kidney: Length: 13 cm. Echogenicity within normal limits. No mass or hydronephrosis visualized. Abdominal aorta: No aneurysm visualized. IMPRESSION: 1. Marked hepatic steatosis to a degree that much of the liver is poorly visualized. 2. Cholelithiasis without findings of acute cholecystitis. Electronically Signed   By: Monte Fantasia M.D.   On: 06/02/2018 11:10   US Venous Img Lower Bilateral  Result Date: 06/02/2018 CLINICAL DATA:  Bilateral pain and edema. Varicose veins. Previous tobacco abuse. EXAM: BILATERAL LOWER EXTREMITY VENOUS DOPPLER ULTRASOUND TECHNIQUE: Gray-scale sonography with compression, as well as color and duplex ultrasound, were performed to evaluate the deep venous system from the level of the common femoral vein through the popliteal and proximal calf veins. COMPARISON:  05/29/2016 FINDINGS: Normal compressibility of the common femoral, superficial femoral, and popliteal veins, as well as the proximal calf veins. No filling defects to suggest DVT on grayscale or color Doppler imaging. Doppler waveforms show normal direction of venous flow, normal respiratory phasicity and response to  augmentation. Visualized segments of the saphenous venous systems normal in caliber and compressibility. IMPRESSION: No evidence of  lower extremity deep vein thrombosis, bilaterally. Electronically Signed   By: Lucrezia Europe M.D.   On: 06/02/2018 11:14    Orson Eva, DO  [facility] Pager [phone]  If 7PM-7AM, please contact night-coverage [email or web address] Password [email or web address] 06/02/2018, 5:19 PM   LOS: 1 day

## 2018-06-03 DIAGNOSIS — G629 Polyneuropathy, unspecified: Secondary | ICD-10-CM

## 2018-06-03 LAB — CBC
HCT: 42.3 % (ref 39.0–52.0)
HEMOGLOBIN: 14.2 g/dL (ref 13.0–17.0)
MCH: 38.9 pg — AB (ref 26.0–34.0)
MCHC: 33.6 g/dL (ref 30.0–36.0)
MCV: 115.9 fL — AB (ref 78.0–100.0)
Platelets: 140 10*3/uL — ABNORMAL LOW (ref 150–400)
RBC: 3.65 MIL/uL — ABNORMAL LOW (ref 4.22–5.81)
RDW: 13.8 % (ref 11.5–15.5)
WBC: 11.5 10*3/uL — ABNORMAL HIGH (ref 4.0–10.5)

## 2018-06-03 LAB — URINALYSIS, ROUTINE W REFLEX MICROSCOPIC
Bilirubin Urine: NEGATIVE
Glucose, UA: NEGATIVE mg/dL
Hgb urine dipstick: NEGATIVE
Ketones, ur: NEGATIVE mg/dL
Leukocytes, UA: NEGATIVE
Nitrite: NEGATIVE
PROTEIN: NEGATIVE mg/dL
Specific Gravity, Urine: 1.017 (ref 1.005–1.030)
pH: 5 (ref 5.0–8.0)

## 2018-06-03 LAB — COMPREHENSIVE METABOLIC PANEL
ALK PHOS: 155 U/L — AB (ref 38–126)
ALT: 38 U/L (ref 0–44)
ANION GAP: 9 (ref 5–15)
AST: 88 U/L — ABNORMAL HIGH (ref 15–41)
Albumin: 2.7 g/dL — ABNORMAL LOW (ref 3.5–5.0)
BUN: 11 mg/dL (ref 6–20)
CO2: 26 mmol/L (ref 22–32)
Calcium: 8.2 mg/dL — ABNORMAL LOW (ref 8.9–10.3)
Chloride: 99 mmol/L (ref 98–111)
Creatinine, Ser: 2.16 mg/dL — ABNORMAL HIGH (ref 0.61–1.24)
GFR calc Af Amer: 40 mL/min — ABNORMAL LOW (ref >60)
GFR, EST NON AFRICAN AMERICAN: 34 mL/min — AB (ref >60)
Glucose, Bld: 153 mg/dL — ABNORMAL HIGH (ref 70–99)
POTASSIUM: 4.9 mmol/L (ref 3.5–5.1)
Sodium: 134 mmol/L — ABNORMAL LOW (ref 135–145)
TOTAL PROTEIN: 6.3 g/dL — AB (ref 6.5–8.1)
Total Bilirubin: 1.8 mg/dL — ABNORMAL HIGH (ref 0.3–1.2)

## 2018-06-03 LAB — T4, FREE: FREE T4: 0.76 ng/dL — AB (ref 0.82–1.77)

## 2018-06-03 LAB — MAGNESIUM: MAGNESIUM: 1.9 mg/dL (ref 1.7–2.4)

## 2018-06-03 LAB — AMMONIA: Ammonia: 33 umol/L (ref 9–35)

## 2018-06-03 LAB — CREATININE, URINE, RANDOM: Creatinine, Urine: 80.42 mg/dL

## 2018-06-03 LAB — HIV ANTIBODY (ROUTINE TESTING W REFLEX): HIV SCREEN 4TH GENERATION: NONREACTIVE

## 2018-06-03 LAB — SODIUM, URINE, RANDOM: Sodium, Ur: 53 mmol/L

## 2018-06-03 MED ORDER — FUROSEMIDE 10 MG/ML IJ SOLN
40.0000 mg | Freq: Two times a day (BID) | INTRAMUSCULAR | Status: DC
  Administered 2018-06-03 – 2018-06-04 (×3): 40 mg via INTRAVENOUS
  Filled 2018-06-03 (×5): qty 4

## 2018-06-03 MED ORDER — SODIUM CHLORIDE 0.9 % IV SOLN
INTRAVENOUS | Status: DC
  Administered 2018-06-03: 11:00:00 via INTRAVENOUS

## 2018-06-03 MED ORDER — HYDROCODONE-ACETAMINOPHEN 5-325 MG PO TABS
1.0000 | ORAL_TABLET | Freq: Four times a day (QID) | ORAL | Status: DC | PRN
  Administered 2018-06-03 – 2018-06-04 (×4): 1 via ORAL
  Filled 2018-06-03 (×4): qty 1

## 2018-06-03 MED ORDER — FUROSEMIDE 10 MG/ML IJ SOLN: 200.0000 mg | Freq: Two times a day (BID) | INTRAVENOUS | Status: DC

## 2018-06-03 MED ORDER — SODIUM CHLORIDE 0.9 % IV SOLN
INTRAVENOUS | Status: DC
  Administered 2018-06-03 – 2018-06-05 (×3): via INTRAVENOUS

## 2018-06-03 MED ORDER — PREDNISONE 20 MG PO TABS
50.0000 mg | ORAL_TABLET | Freq: Every day | ORAL | Status: DC
  Administered 2018-06-04: 50 mg via ORAL
  Filled 2018-06-03: qty 2

## 2018-06-03 MED ORDER — LACTULOSE 10 GM/15ML PO SOLN
30.0000 g | Freq: Two times a day (BID) | ORAL | Status: DC
  Administered 2018-06-04: 30 g via ORAL
  Filled 2018-06-03: qty 60

## 2018-06-03 NOTE — Progress Notes (Addendum)
Pt asking about leaving floor to go smoke. Informed him of hospital policy about leaving and pt verbalized understanding. Before leaving room, pt asked again to be disconnected from IV fluids so he can go smoke. Re-educated on hospital policy. Sister in room and aware of policy as well.

## 2018-06-03 NOTE — Consult Note (Signed)
Reason for Consult: Acute kidney injury Referring Physician: Dr. Samuella Cota is an 52 y.o. male.  HPI: He is a patient who has history of COPD, hypertension, GERD presently came with complaints of some nausea, vomiting and abdominal pain for the last couple of days.  Patient also has some loose stool.  Presently he is feeling somewhat better.  Patient also complains of decreased urine output.  Patient denies any previous history of renal failure or kidney stone.  He has also some swelling of his legs for some time.  He has occasional difficulty breathing some orthopnea.  He said he is feeling somewhat better.  Patient ibuprofen for headache on and off.  Denies taking any recently.  Past Medical History:  Diagnosis Date  . Anxiety   . Enlarged liver   . GERD (gastroesophageal reflux disease)   . Hypertension   . Palpitations   . PVC's (premature ventricular contractions)   . Shortness of breath   . Varicose veins     Past Surgical History:  Procedure Laterality Date  . COLONOSCOPY WITH ESOPHAGOGASTRODUODENOSCOPY (EGD)  12/16/2012   Procedure: COLONOSCOPY WITH ESOPHAGOGASTRODUODENOSCOPY (EGD);  Surgeon: Daneil Dolin, MD;  Location: [facility];  Service: Endoscopy;  Laterality: N/A;  7:30  . cyst reomved     from spine  . ESOPHAGOGASTRODUODENOSCOPY  05/19/09   RMR: Geographic distal esophageal erosions consistent with severe erosive reflux esophagitis. schatzki's ring s/P dilation/small hiatal hernia otherwise normal stomach  . WISDOM TOOTH EXTRACTION      Family History  Problem Relation Age of Onset  . Cancer Mother        ?etiology  . Heart disease Mother   . Colon polyps Sister 52       two sisters  . Liver disease Neg Hx     Social History:  reports that he has been smoking cigarettes.  He started smoking about 38 years ago. He has a 45.00 pack-year smoking history. He has never used smokeless tobacco. He reports that he drinks alcohol. He reports that he does not  use drugs.  Allergies: No Known Allergies  Medications: I have reviewed the patient's current medications.  Results for orders placed or performed during the hospital encounter of 06/01/18 (from the past 48 hour(s))  T4, free     Status: None   Collection Time: 06/01/18  4:25 PM  Result Value Ref Range   Free T4 0.97 0.82 - 1.77 ng/dL    Comment: (NOTE) Biotin ingestion may interfere with free T4 tests. If the results are inconsistent with the TSH level, previous test results, or the clinical presentation, then consider biotin interference. If needed, order repeat testing after stopping biotin. Performed at [facility], [place] [address] [place], [place] [address]   Vitamin B12     Status: Abnormal   Collection Time: 06/01/18  4:25 PM  Result Value Ref Range   Vitamin B-12 1,889 (H) 180 - 914 pg/mL    Comment: (NOTE) This assay is not validated for testing neonatal or myeloproliferative syndrome specimens for Vitamin B12 levels. Performed at [facility], [place] [address] [place], [place] [address]   Folate     Status: Abnormal   Collection Time: 06/01/18  4:25 PM  Result Value Ref Range   Folate 4.0 (L) >5.9 ng/mL    Comment: Performed at [facility], [place] [address] [place], [place] [address]  Occult blood card to lab, stool RN will collect  Status: None   Collection Time: 06/02/18  2:38 AM  Result Value Ref Range   Fecal Occult Bld NEGATIVE NEGATIVE    Comment: Performed at [facility], [address] [place], [place] [address]  Protein / creatinine ratio, urine     Status: None   Collection Time: 06/02/18  2:38 AM  Result Value Ref Range   Creatinine, Urine 85.36 mg/dL   Total Protein, Urine 7 mg/dL    Comment: NO NORMAL RANGE ESTABLISHED FOR THIS TEST   Protein Creatinine Ratio 0.08 0.00 - 0.15 mg/mg[Cre]    Comment: Performed at [facility], [address], [place], [place] [address]  HIV antibody (Routine Testing)     Status: None    Collection Time: 06/02/18  4:51 AM  Result Value Ref Range   HIV Screen 4th Generation wRfx Non Reactive Non Reactive    Comment: (NOTE) Performed At: [facility] [place], [place] [id number] Rush Farmer MD CB:[id number] Performed at [facility], [address], [place], [place] [address]   Comprehensive metabolic panel     Status: Abnormal   Collection Time: 06/02/18  4:51 AM  Result Value Ref Range   Sodium 132 (L) 135 - 145 mmol/L   Potassium 4.3 3.5 - 5.1 mmol/L    Comment: DELTA CHECK NOTED   Chloride 96 (L) 101 - 111 mmol/L   CO2 26 22 - 32 mmol/L   Glucose, Bld 206 (H) 65 - 99 mg/dL   BUN 5 (L) 6 - 20 mg/dL   Creatinine, Ser 0.75 0.61 - 1.24 mg/dL   Calcium 8.1 (L) 8.9 - 10.3 mg/dL   Total Protein 6.0 (L) 6.5 - 8.1 g/dL   Albumin 2.5 (L) 3.5 - 5.0 g/dL   AST 118 (H) 15 - 41 U/L   ALT 40 17 - 63 U/L   Alkaline Phosphatase 145 (H) 38 - 126 U/L   Total Bilirubin 2.5 (H) 0.3 - 1.2 mg/dL   GFR calc non Af Amer >60 >60 mL/min   GFR calc Af Amer >60 >60 mL/min    Comment: (NOTE) The eGFR has been calculated using the CKD EPI equation. This calculation has not been validated in all clinical situations. eGFR's persistently <60 mL/min signify possible Chronic Kidney Disease.    Anion gap 10 5 - 15    Comment: Performed at [facility], [address], [place], [place] [address]  CBC     Status: Abnormal   Collection Time: 06/02/18  4:51 AM  Result Value Ref Range   WBC 5.3 4.0 - 10.5 K/uL   RBC 3.50 (L) 4.22 - 5.81 MIL/uL   Hemoglobin 13.7 13.0 - 17.0 g/dL   HCT 39.7 39.0 - 52.0 %   MCV 113.4 (H) 78.0 - 100.0 fL   MCH 39.1 (H) 26.0 - 34.0 pg   MCHC 34.5 30.0 - 36.0 g/dL   RDW 13.4 11.5 - 15.5 %   Platelets 124 (L) 150 - 400 K/uL    Comment: Performed at [facility], [address] [place], [place] [address]  Ammonia     Status: Abnormal   Collection Time: 06/02/18  4:51 AM  Result Value Ref Range   Ammonia 49 (H) 9 - 35 umol/L    Comment:  Performed at [facility], [address] [place], [place] [address]  CBC     Status: Abnormal   Collection Time: 06/03/18  5:01 AM  Result Value Ref Range   WBC 11.5 (H) 4.0 - 10.5  K/uL   RBC 3.65 (L) 4.22 - 5.81 MIL/uL   Hemoglobin 14.2 13.0 - 17.0 g/dL   HCT 42.3 39.0 - 52.0 %   MCV 115.9 (H) 78.0 - 100.0 fL   MCH 38.9 (H) 26.0 - 34.0 pg   MCHC 33.6 30.0 - 36.0 g/dL   RDW 13.8 11.5 - 15.5 %   Platelets 140 (L) 150 - 400 K/uL    Comment: Performed at [facility], [address], [place], [place] [address]  Comprehensive metabolic panel     Status: Abnormal   Collection Time: 06/03/18  5:01 AM  Result Value Ref Range   Sodium 134 (L) 135 - 145 mmol/L   Potassium 4.9 3.5 - 5.1 mmol/L   Chloride 99 98 - 111 mmol/L   CO2 26 22 - 32 mmol/L   Glucose, Bld 153 (H) 70 - 99 mg/dL   BUN 11 6 - 20 mg/dL   Creatinine, Ser 2.16 (H) 0.61 - 1.24 mg/dL    Comment: DELTA CHECK NOTED   Calcium 8.2 (L) 8.9 - 10.3 mg/dL   Total Protein 6.3 (L) 6.5 - 8.1 g/dL   Albumin 2.7 (L) 3.5 - 5.0 g/dL   AST 88 (H) 15 - 41 U/L   ALT 38 0 - 44 U/L   Alkaline Phosphatase 155 (H) 38 - 126 U/L   Total Bilirubin 1.8 (H) 0.3 - 1.2 mg/dL   GFR calc non Af Amer 34 (L) >60 mL/min   GFR calc Af Amer 40 (L) >60 mL/min    Comment: (NOTE) The eGFR has been calculated using the CKD EPI equation. This calculation has not been validated in all clinical situations. eGFR's persistently <60 mL/min signify possible Chronic Kidney Disease.    Anion gap 9 5 - 15    Comment: Performed at [facility], [address], [place], [place] [address]  Ammonia     Status: None   Collection Time: 06/03/18  5:01 AM  Result Value Ref Range   Ammonia 33 9 - 35 umol/L    Comment: Performed at [facility], [address], [place], [place] [address]  Magnesium     Status: None   Collection Time: 06/03/18  5:01 AM  Result Value Ref Range   Magnesium 1.9 1.7 - 2.4 mg/dL    Comment: Performed at [facility], [address],  [place], [place] [address]  T4, free     Status: Abnormal   Collection Time: 06/03/18  5:01 AM  Result Value Ref Range   Free T4 0.76 (L) 0.82 - 1.77 ng/dL    Comment: (NOTE) Biotin ingestion may interfere with free T4 tests. If the results are inconsistent with the TSH level, previous test results, or the clinical presentation, then consider biotin interference. If needed, order repeat testing after stopping biotin. Performed at [facility], [place] [address], [place], [place] [address]     US Abdomen Complete  Result Date: 06/02/2018 CLINICAL DATA:  Abdominal pain with nausea and vomiting for 3 weeks EXAM: ABDOMEN ULTRASOUND COMPLETE COMPARISON:  None. FINDINGS: Gallbladder: Mobile gallstones and sludge. No focal tenderness or wall thickening. Common bile duct: Diameter: 4 mm Liver: Echogenic from steatosis which decreases visualization of mid and deep parenchyma. Portal vein is patent on color Doppler imaging with normal direction of blood flow towards the liver (assuming standard Doppler scale on the prior images). IVC: No abnormality visualized. Pancreas: Visualized portion unremarkable. Spleen: Size and appearance within normal limits. Right Kidney: Length: 13 cm. Echogenicity  within normal limits. No mass or hydronephrosis visualized. Left Kidney: Length: 13 cm. Echogenicity within normal limits. No mass or hydronephrosis visualized. Abdominal aorta: No aneurysm visualized. IMPRESSION: 1. Marked hepatic steatosis to a degree that much of the liver is poorly visualized. 2. Cholelithiasis without findings of acute cholecystitis. Electronically Signed   By: Monte Fantasia M.D.   On: 06/02/2018 11:10   Ct Abdomen Pelvis W Contrast  Result Date: 06/02/2018 CLINICAL DATA:  Inpatient. Right upper quadrant abdominal pain with nausea and vomiting for 2 weeks. EXAM: CT ABDOMEN AND PELVIS WITH CONTRAST TECHNIQUE: Multidetector CT imaging of the abdomen and pelvis was performed using the standard  protocol following bolus administration of intravenous contrast. CONTRAST:  153m ISOVUE-300 IOPAMIDOL (ISOVUE-300) INJECTION 61% COMPARISON:  06/02/2018 abdominal sonogram. FINDINGS: Lower chest: No significant pulmonary nodules or acute consolidative airspace disease. Hepatobiliary: Hepatomegaly. Diffuse hepatic steatosis. No definite liver surface irregularity. No liver masses. Cholelithiasis. Nondistended gallbladder. No gallbladder wall thickening. No pericholecystic fluid. No biliary ductal dilatation. Pancreas: Normal, with no mass or duct dilation. Spleen: Normal size. No mass. Adrenals/Urinary Tract: Normal adrenals. Delayed contrast nephrograms bilaterally. No hydronephrosis. No renal masses. Normal nondistended bladder. Stomach/Bowel: Normal non-distended stomach. Normal caliber small bowel with no small bowel wall thickening. Normal appendix. Scattered mild colonic diverticulosis, with no large bowel wall thickening or significant pericolonic fat stranding. Vascular/Lymphatic: Atherosclerotic nonaneurysmal abdominal aorta. Patent portal, splenic, hepatic and renal veins. No pathologically enlarged lymph nodes in the abdomen or pelvis. Reproductive: Normal size prostate. Other: No pneumoperitoneum, ascites or focal fluid collection. Musculoskeletal: No aggressive appearing focal osseous lesions. Moderate thoracolumbar spondylosis. Bilateral L5 pars defects. IMPRESSION: 1. Hepatomegaly. Diffuse hepatic steatosis. No definite morphologic changes of cirrhosis. 2. Cholelithiasis. No CT findings of acute cholecystitis. No biliary ductal dilatation. 3. No evidence of bowel obstruction or acute bowel inflammation. Mild colonic diverticulosis, with no evidence of acute diverticulitis. 4. Delayed contrast nephrograms bilaterally, suggesting nonspecific acute renal failure. No hydronephrosis. 5. Bilateral L5 pars defects. 6.  Aortic Atherosclerosis (ICD10-I70.0). Electronically Signed   By: JIlona SorrelM.D.   On:  06/02/2018 20:49   [place]Venous Img Lower Bilateral  Result Date: 06/02/2018 CLINICAL DATA:  Bilateral pain and edema. Varicose veins. Previous tobacco abuse. EXAM: BILATERAL LOWER EXTREMITY VENOUS DOPPLER ULTRASOUND TECHNIQUE: Gray-scale sonography with compression, as well as color and duplex ultrasound, were performed to evaluate the deep venous system from the level of the common femoral vein through the popliteal and proximal calf veins. COMPARISON:  05/29/2016 FINDINGS: Normal compressibility of the common femoral, superficial femoral, and popliteal veins, as well as the proximal calf veins. No filling defects to suggest DVT on grayscale or color Doppler imaging. Doppler waveforms show normal direction of venous flow, normal respiratory phasicity and response to augmentation. Visualized segments of the saphenous venous systems normal in caliber and compressibility. IMPRESSION: No evidence of  lower extremity deep vein thrombosis, bilaterally. Electronically Signed   By: DLucrezia EuropeM.D.   On: 06/02/2018 11:14    Review of Systems  Constitutional: Negative for chills and fever.  Respiratory: Positive for shortness of breath.   Cardiovascular: Positive for leg swelling. Negative for chest pain.  Gastrointestinal: Positive for abdominal pain, nausea and vomiting.  Genitourinary:       Decreased urine output   Blood pressure 127/84, pulse 89, temperature (!) 97.4 F (36.3 C), temperature source Oral, resp. rate 20, height 6' 1"  (1.854 m), weight 117.9 kg (260 lb), SpO2 98 %. Physical Exam  Constitutional: He is  oriented to person, place, and time. No distress.  Neck: No JVD present.  Cardiovascular: Normal rate and regular rhythm.  Respiratory: No respiratory distress. He has no wheezes.  GI: He exhibits distension. There is no tenderness. There is no rebound.  Musculoskeletal: He exhibits edema.  Neurological: He is alert and oriented to person, place, and time.    Assessment/Plan: 1]  acute kidney injury: Most likely secondary to dye induced acute kidney injury as his creatinine has increased significantly within 24 hours.  Presently patient is nonoliguric. 2] hypertension 3] history of alcohol abuse.  According the patient he has been in rehab before but still continued to consume alcohol after his discharge. 4] history of peripheral neuropathy: Patient on Neurontin 5] history of gout 6] possible history of COPD: Patient on inhaler 7] hyponatremia: Possibly hypervolemic hyponatremia.  His sodium is stable 8] history of liver cirrhosis Plan: 1] we will start patient on normal saline at 75 cc/h 2] we will check UA 3] we will check urine sodium and creatinine 4] we will check hepatitis B surface antigen and hepatitis C antibody 5] we will start patient on Lasix 40 mg IV twice daily 6] we will check a renal his  renal panel    Yovanny Coats S 06/03/2018, 3:32 PM

## 2018-06-03 NOTE — Progress Notes (Signed)
PROGRESS NOTE  Bradley Miles RXY:[id number] DOB: 10-06-1966 DOA: 06/01/2018 PCP: [name], MD   Brief History:  52 y.o.malewith medical history ofalcohol abuse, presumptive COPD, peripheral neuropathy, hypertension presenting with approximately 3-week history of nausea, vomiting, abdominal pain. The patient has a history of alcohol dependence and has had relapse in his alcohol use despite previous rehabilitation stays. The patient has been drinking 2/3 gallon of whiskey daily with his last drink in am 6/23 prior to arrival to ED.nevertheless, the patient has just returned from vacation from [place] despite his abdominal pain, nausea, and vomiting. The patient denies any fevers, chills, headache, neck pain. He has been complaining of sob and LE edema and increased abd girth. In the emergency department, the patient was afebrile and hemodynamically stable albeit with soft blood pressure 107/73. Oxygen saturation was fluctuating with desaturations while the patient was asleep. BMP was 126 with potassium 3.3.AST 125, ALT 41, alk phosphatase 151, total bilirubin 3.1, ammonia 60, lipase 58. CBC showed normal cytopenia with hemoglobin 13.5. INR was 1.23. Urinalysis and urine drug screen was negative.Alcohol level was 255. Chest x-ray was negative for any acute findings. The patient was given Ativan, Pepcid, lactulose, and Zofran and bolused 1 L.   Assessment/Plan: Acute metabolic Encephalopathy -multifactorial including Etoh intoxication and hyperammonemia -started lactulose -now improved, back to baseline  Hyperammonemia -pt presented with somnolence -Continue lactulose -Trend ammonia 60>>>49>>>33 -somnolence improved  Alcoholic Hepatitis/Abdominal pain -Abdominal ultrasound--cholelithiasis with sludge without gallbladder wall thickening.  Hepatic steatosis.  No hydronephrosis. -CT abd/pelvis--hepatomegaly; hepatic steatosis; cholelithiasis without  cholecystitis; no hydronephrosis, no ascites -Trend LFTs -->improving  AKI -multifactorial including ketoralac, contrast nephropathy, and volume depletion from loose stools (lactulose) -nephrology consulted -abd us--no hydronephrosis  Alcohol abuse with withdrawal -Alcohol withdrawal protocol  -increase ativan to q 4 hours  Acute respiratory failure with hypoxia -Multifactorial including COPD exacerbation, OHS/OSA, and hypoventilation -Stable on 2 L>> weaned to room air -Echocardiogram--EF 60 to 65%, no WMA, trivial TR  COPD exacerbation -Continue Pulmicort -Continue duo nebs -Continue IV Solu-Medrol>>>po prednisone  Hyponatremia -Likely due to volume depletion -Judicious IV hydration-->improved -A.m. 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 -Check FOBT--neg -Trend hemoglobin  Tobacco abuse -NicoDerm patch I have discussed tobacco cessation with the patient. I have counseled the patient regarding the negative impacts of continued tobacco use including but not limited to lung cancer, COPD, and cardiovascular disease. I have discussed alternatives to tobacco and modalities that may help facilitate tobacco cessation including but not limited to biofeedback, hypnosis, and medications. Total time spent with tobacco counseling was 4 minutes.  Hyperglycemia -due to steroids -HbA1C-5.5    Disposition Plan:   Home when renal function improves Family Communication:   Sister update at bedside 6/25--Total time spent 35 minutes.   Greater than 50% spent face to face counseling and coordinating care.   Consultants:  renal  Code Status:  FULL  DVT Prophylaxis:  SCDs   Procedures: As Listed in Progress Note Above  Antibiotics: None      Subjective: Patient continues to complain of right upper quadrant abdominal pain.  He denies any headache, chest pain, shortness  breath, nausea, vomiting, diarrhea, dysuria, hematuria.  Objective: Vitals:   06/03/18 0745 06/03/18 1008 06/03/18 1401 06/03/18 1608  BP:  127/84  116/74  Pulse:  89  90  Resp:  20  20  Temp:  (!) 97.4 F (36.3 C)  97.7 F (36.5 C)  TempSrc:  Oral  Oral  SpO2: 96% 94% 98% 92%  Weight:      Height:        Intake/Output Summary (Last 24 hours) at 06/03/2018 1722 Last data filed at 06/03/2018 1644 Gross per 24 hour  Intake 3330 ml  Output -  Net 3330 ml   Weight change:  Exam:   General:  Pt is alert, follows commands appropriately, not in acute distress  HEENT: No icterus, No thrush, No neck mass, [place]/AT  Cardiovascular: RRR, S1/S2, no rubs, no gallops  Respiratory: Bibasilar crackles but no wheezing.  Good air movement.  Abdomen: Soft/+BS, non tender, non distended, no guarding  Extremities: No edema, No lymphangitis, No petechiae, No rashes, no synovitis   Data Reviewed: I have personally reviewed following labs and imaging studies Basic Metabolic Panel: Recent Labs  Lab 06/01/18 0745 06/01/18 0832 06/02/18 0451 06/03/18 0501  NA 126*  --  132* 134*  K 3.3*  --  4.3 4.9  CL 88*  --  96* 99  CO2 27  --  26 26  GLUCOSE 164*  --  206* 153*  BUN 5*  --  5* 11  CREATININE 0.63  --  0.75 2.16*  CALCIUM 7.9*  --  8.1* 8.2*  MG  --  1.5*  --  1.9   Liver Function Tests: Recent Labs  Lab 06/01/18 0745 06/02/18 0451 06/03/18 0501  AST 125* 118* 88*  ALT 41 40 38  ALKPHOS 151* 145* 155*  BILITOT 3.1* 2.5* 1.8*  PROT 6.0* 6.0* 6.3*  ALBUMIN 2.6* 2.5* 2.7*   Recent Labs  Lab 06/01/18 0745  LIPASE  58*   Recent Labs  Lab 06/01/18 0831 06/02/18 0451 06/03/18 0501  AMMONIA 60* 49* 33   Coagulation Profile: Recent Labs  Lab 06/01/18 0831  INR 1.23   CBC: Recent Labs  Lab 06/01/18 0745 06/02/18 0451 06/03/18 0501  WBC 6.5 5.3 11.5*  HGB 13.5 13.7 14.2  HCT 38.6* 39.7 42.3  MCV 111.6* 113.4* 115.9*  PLT 135* 124* 140*   Cardiac Enzymes: No results for input(s): CKTOTAL, CKMB, CKMBINDEX, TROPONINI in the last 168 hours. BNP: Invalid input(s): POCBNP CBG: No results for input(s): GLUCAP in the last 168 hours. HbA1C: Recent Labs    06/01/18 0745  HGBA1C 5.5   Urine analysis:    Component Value Date/Time   COLORURINE YELLOW 06/01/2018 0730   APPEARANCEUR CLEAR 06/01/2018 0730   LABSPEC 1.003 (L) 06/01/2018 0730   PHURINE 7.0 06/01/2018 0730   GLUCOSEU NEGATIVE 06/01/2018 0730   HGBUR NEGATIVE 06/01/2018 0730   BILIRUBINUR NEGATIVE 06/01/2018 0730   KETONESUR NEGATIVE 06/01/2018 0730   PROTEINUR NEGATIVE 06/01/2018 0730   NITRITE NEGATIVE 06/01/2018 0730   LEUKOCYTESUR NEGATIVE 06/01/2018 0730   Sepsis Labs: _0 (procalcitonin:4,lacticidven:4) )No results found for this or any previous visit (from the past 240 hour(s)).   Scheduled Meds: . acebutolol  400 mg Oral BID  . allopurinol  100 mg Oral Daily  . budesonide (PULMICORT) nebulizer solution  0.5 mg Nebulization BID  . folic acid  1 mg Oral Daily  . furosemide  40 mg Intravenous BID  . gabapentin  400 mg Oral TID  . ipratropium-albuterol  3 mL Nebulization TID  . lactulose  30 g Oral TID  . methylPREDNISolone (SOLU-MEDROL) injection  60 mg Intravenous Q12H  . multivitamin with minerals  1 tablet Oral Daily  . naltrexone  50 mg Oral QHS  . nicotine  21 mg Transdermal Daily  . omega-3 acid ethyl esters  2 g Oral Daily  . pantoprazole  40 mg Oral Daily  . pneumococcal 23 valent vaccine  0.5 mL Intramuscular Tomorrow-1000  . thiamine  100 mg Oral Daily   Or  . thiamine  100 mg Intravenous  Daily  . vitamin B-12  1,000 mcg Oral Daily   Continuous Infusions: . sodium chloride Stopped (06/03/18 1635)  . sodium chloride 75 mL/hr at 06/03/18 1635    Procedures/Studies: Dg Chest 2 View  Result Date: 06/01/2018 CLINICAL DATA:  Cough with dyspnea and right upper quadrant pain. EXAM: CHEST - 2 VIEW COMPARISON:  01/06/2018 FINDINGS: Lungs are adequately inflated without focal consolidation or effusion. Cardiomediastinal silhouette and remainder the exam is unchanged. IMPRESSION: No active cardiopulmonary disease. Electronically Signed   By: Marin Olp M.D.   On: 06/01/2018 09:21   US Abdomen Complete  Result Date: 06/02/2018 CLINICAL DATA:  Abdominal pain with nausea and vomiting for 3 weeks EXAM: ABDOMEN ULTRASOUND COMPLETE COMPARISON:  None. FINDINGS: Gallbladder: Mobile gallstones and sludge. No focal tenderness or wall thickening. Common bile duct: Diameter: 4 mm Liver: Echogenic from steatosis which decreases visualization of mid and deep parenchyma. Portal vein is patent on color Doppler imaging with normal direction of blood flow towards the liver (assuming standard Doppler scale on the prior images). IVC: No abnormality visualized. Pancreas: Visualized portion unremarkable. Spleen: Size and appearance within normal limits. Right Kidney: Length: 13 cm. Echogenicity within normal limits. No mass or hydronephrosis visualized. Left Kidney: Length: 13 cm. Echogenicity within normal limits. No mass or hydronephrosis visualized. Abdominal aorta: No aneurysm visualized. IMPRESSION: 1. Marked hepatic steatosis to a degree that much of the liver is poorly visualized. 2. Cholelithiasis without findings of acute cholecystitis. Electronically Signed   By: Monte Fantasia M.D.   On: 06/02/2018 11:10   Ct Abdomen Pelvis W Contrast  Result Date: 06/02/2018 CLINICAL DATA:  Inpatient. Right upper quadrant abdominal pain with nausea and vomiting for 2 weeks. EXAM: CT ABDOMEN AND PELVIS WITH CONTRAST  TECHNIQUE: Multidetector CT imaging of the abdomen and pelvis was performed using the standard protocol following bolus administration of intravenous contrast. CONTRAST:  137m ISOVUE-300 IOPAMIDOL (ISOVUE-300) INJECTION 61% COMPARISON:  06/02/2018 abdominal sonogram. FINDINGS: Lower chest: No significant pulmonary nodules or acute consolidative airspace disease. Hepatobiliary: Hepatomegaly. Diffuse hepatic steatosis. No definite liver surface irregularity. No liver masses. Cholelithiasis. Nondistended gallbladder. No gallbladder wall thickening. No pericholecystic fluid. No biliary ductal dilatation. Pancreas: Normal, with no mass or duct dilation. Spleen: Normal size. No mass. Adrenals/Urinary Tract: Normal adrenals. Delayed contrast nephrograms bilaterally. No hydronephrosis. No renal masses. Normal nondistended bladder. Stomach/Bowel: Normal non-distended stomach. Normal caliber small bowel with no small bowel wall thickening. Normal appendix. Scattered mild colonic diverticulosis, with no large bowel wall thickening or significant pericolonic fat stranding. Vascular/Lymphatic: Atherosclerotic nonaneurysmal abdominal aorta. Patent portal, splenic, hepatic and renal veins. No pathologically enlarged lymph nodes in the abdomen or pelvis. Reproductive: Normal size prostate. Other: No pneumoperitoneum, ascites or focal fluid collection. Musculoskeletal: No aggressive appearing focal osseous lesions. Moderate thoracolumbar spondylosis. Bilateral L5 pars defects. IMPRESSION: 1. Hepatomegaly. Diffuse hepatic steatosis. No definite morphologic changes of cirrhosis. 2. Cholelithiasis. No CT findings of acute cholecystitis. No biliary  ductal dilatation. 3. No evidence of bowel obstruction or acute bowel inflammation. Mild colonic diverticulosis, with no evidence of acute diverticulitis. 4. Delayed contrast nephrograms bilaterally, suggesting nonspecific acute renal failure. No hydronephrosis. 5. Bilateral L5 pars defects.  6.  Aortic Atherosclerosis (ICD10-I70.0). Electronically Signed   By: Ilona Sorrel M.D.   On: 06/02/2018 20:49   US Venous Img Lower Bilateral  Result Date: 06/02/2018 CLINICAL DATA:  Bilateral pain and edema. Varicose veins. Previous tobacco abuse. EXAM: BILATERAL LOWER EXTREMITY VENOUS DOPPLER ULTRASOUND TECHNIQUE: Gray-scale sonography with compression, as well as color and duplex ultrasound, were performed to evaluate the deep venous system from the level of the common femoral vein through the popliteal and proximal calf veins. COMPARISON:  05/29/2016 FINDINGS: Normal compressibility of the common femoral, superficial femoral, and popliteal veins, as well as the proximal calf veins. No filling defects to suggest DVT on grayscale or color Doppler imaging. Doppler waveforms show normal direction of venous flow, normal respiratory phasicity and response to augmentation. Visualized segments of the saphenous venous systems normal in caliber and compressibility. IMPRESSION: No evidence of  lower extremity deep vein thrombosis, bilaterally. Electronically Signed   By: Lucrezia Europe M.D.   On: 06/02/2018 11:14    Orson Eva, DO  [facility] Pager [phone]  If 7PM-7AM, please contact night-coverage [email or web address] Password [email or web address] 06/03/2018, 5:22 PM   LOS: 2 days

## 2018-06-04 DIAGNOSIS — K7031 Alcoholic cirrhosis of liver with ascites: Secondary | ICD-10-CM

## 2018-06-04 DIAGNOSIS — F17200 Nicotine dependence, unspecified, uncomplicated: Secondary | ICD-10-CM

## 2018-06-04 DIAGNOSIS — J9601 Acute respiratory failure with hypoxia: Secondary | ICD-10-CM

## 2018-06-04 LAB — RENAL FUNCTION PANEL
ANION GAP: 10 (ref 5–15)
Albumin: 2.5 g/dL — ABNORMAL LOW (ref 3.5–5.0)
BUN: 19 mg/dL (ref 6–20)
CALCIUM: 8.3 mg/dL — AB (ref 8.9–10.3)
CO2: 24 mmol/L (ref 22–32)
CREATININE: 3.63 mg/dL — AB (ref 0.61–1.24)
Chloride: 99 mmol/L (ref 98–111)
GFR calc Af Amer: 21 mL/min — ABNORMAL LOW (ref >60)
GFR calc non Af Amer: 18 mL/min — ABNORMAL LOW (ref >60)
GLUCOSE: 146 mg/dL — AB (ref 70–99)
Phosphorus: 3.6 mg/dL (ref 2.5–4.6)
Potassium: 4.9 mmol/L (ref 3.5–5.1)
SODIUM: 133 mmol/L — AB (ref 135–145)

## 2018-06-04 LAB — CBC
HCT: 41.9 % (ref 39.0–52.0)
Hemoglobin: 13.9 g/dL (ref 13.0–17.0)
MCH: 38.5 pg — AB (ref 26.0–34.0)
MCHC: 33.2 g/dL (ref 30.0–36.0)
MCV: 116.1 fL — AB (ref 78.0–100.0)
PLATELETS: 156 10*3/uL (ref 150–400)
RBC: 3.61 MIL/uL — AB (ref 4.22–5.81)
RDW: 14 % (ref 11.5–15.5)
WBC: 13.1 10*3/uL — ABNORMAL HIGH (ref 4.0–10.5)

## 2018-06-04 LAB — VITAMIN B1: Vitamin B1 (Thiamine): 108.3 nmol/L (ref 66.5–200.0)

## 2018-06-04 LAB — PROTIME-INR
INR: 1.14
Prothrombin Time: 14.5 seconds (ref 11.4–15.2)

## 2018-06-04 LAB — AMMONIA: Ammonia: 51 umol/L — ABNORMAL HIGH (ref 9–35)

## 2018-06-04 LAB — CK: Total CK: 42 U/L — ABNORMAL LOW (ref 49–397)

## 2018-06-04 LAB — HEPATITIS C ANTIBODY

## 2018-06-04 MED ORDER — LACTULOSE 10 GM/15ML PO SOLN
30.0000 g | Freq: Three times a day (TID) | ORAL | Status: DC
  Administered 2018-06-04 – 2018-06-07 (×8): 30 g via ORAL
  Filled 2018-06-04 (×8): qty 60

## 2018-06-04 MED ORDER — OXYCODONE HCL 5 MG PO TABS
5.0000 mg | ORAL_TABLET | Freq: Four times a day (QID) | ORAL | Status: DC | PRN
  Administered 2018-06-04 – 2018-06-09 (×16): 5 mg via ORAL
  Filled 2018-06-04 (×17): qty 1

## 2018-06-04 NOTE — Progress Notes (Signed)
Patient has been bladder scanned per order. No urine observed. However patient voided 400 cc at 0900 and 100 cc urine prior to bladder scan. Patient denies any bladder discomfort at this time.

## 2018-06-04 NOTE — Progress Notes (Signed)
PROGRESS NOTE    Bradley Miles  UJW:[id number] DOB: [birth year] DOA: 06/01/2018 PCP: [name], MD     Brief Narrative:  52 year old man admitted from home on 6/23With a 3-week history of nausea, vomiting and abdominal pain, also with significant confusion.  He has a history of alcohol dependence and has been drinking two thirds of a gallon of whiskey daily with his last drink on 6/23 prior to arrival to the ED.  Patient was found to have elevated LFTs with a significant AST to ALT split, drug screen was negative, alcohol level was 255.  Past medical history significant for alcohol abuse, presumptive COPD, peripheral neuropathy and hypertension.  Admission was requested for further evaluation and management.   Assessment & Plan:   Active Problems:   TOBACCO ABUSE   Hypertension   EtOH dependence ([place])   Sensory neuropathy   Prediabetes   Hepatic encephalopathy (HCC)   Acute respiratory failure with hypoxia (HCC)   Alcoholic cirrhosis of liver with ascites (HCC)   COPD with acute exacerbation (HCC)   Alcoholic peripheral neuropathy (HCC)   Acute metabolic encephalopathy -Believe secondary to acute alcohol intoxication and hyper ammonemia with early hepatic encephalopathy. -He has had significant improvement in his confusion with lactulose, however ammonia level is higher today.  We will increase lactulose dosing from twice daily to 3 times daily.  Trend ammonia level in a.m. -He was thought to have acute alcoholic hepatitis, however to me his LFTs are much more indicative of alcohol use with his significant AST to ALT split.  Definitely not an obstructive pattern. -Maddrey's discriminant factor gives him a score of 8.7 which is a good prognosis, I do not believe he needs steroids for this reason.  As he has only been receiving prednisone for 2 days I believe it is safe to abruptly discontinue instead of tapering. -Abdominal ultrasound shows cholelithiasis without gallbladder  wall thickening and hepatic steatosis. -CT abdomen and pelvis shows hepatomegaly, hepatic steatosis and cholelithiasis without cholecystitis. -We will continue to trend LFTs in a.m. -Discontinue all acetaminophen products.  Alcohol abuse -Continue thiamine, folate. -Continue to monitor for withdrawals on CIWA protocol, has not exhibited significant withdrawal signs yet. -Have spent a significant amount of time with him today discussing alcohol cessation and will ask social work to see him in the morning to provide resources.  Acute respiratory failure with hypoxemia -Suspect multifactorial due to COPD with acute exacerbation, OHS/OSA and possibly hypoventilation due to acute alcohol intoxication. -He has now been weaned to room air. -2D echocardiogram shows an ejection fraction of 60 to 65% with no wall motion abnormalities and trivial tricuspid regurgitation. -He has no wheezing on lung auscultation today, have discontinued steroids see above for details.  He does not need steroids for his COPD either.  Acute renal failure -Multifactorial suspect related to significant NSAID use at home and possibly contrast nephropathy. -Nephrology is following, appreciate their input and recommendations. -Renal function is worse today up to 3.63 from 2.16 on 6/25.  Hyponatremia -Suspect due to volume depletion, sodium is 133 today, continue to follow.  Intractable vomiting -Significantly improved, suspect related to alcoholic gastritis -PRN Zofran and PPI ordered, he is tolerating diet.  Tobacco abuse -I have discussed tobacco cessation with the patient.  I have counseled the patient regarding the negative impacts of continued tobacco use including but not limited to lung cancer, COPD, and cardiovascular disease.  I have discussed alternatives to tobacco and modalities that may help facilitate tobacco cessation including  but not limited to biofeedback, hypnosis, and medications.  Total time spent with  tobacco counseling was 4 minutes.  Hyperglycemia -No history of diabetes, hemoglobin A1c is 5.5. -Suspect due to steroids and should improve now that we have discontinued them.    DVT prophylaxis: SCDs Code Status: Full code Family Communication: Multiple family members at bedside including sister and stepfather updated on plan of care and all questions answered Disposition Plan: Home pending improvement in liver and renal function  Consultants:   Nephrology  Procedures:   2D echo as above  Antimicrobials:  Anti-infectives (From admission, onward)   None       Subjective: Lying in bed, much more oriented, still complains of right upper quadrant pain  Objective: Vitals:   06/04/18 0744 06/04/18 0900 06/04/18 1410 06/04/18 1459  BP:  140/77  135/86  Pulse:  83  89  Resp:    16  Temp:    97.6 F (36.4 C)  TempSrc:    Oral  SpO2: 100%  96% 94%  Weight:      Height:        Intake/Output Summary (Last 24 hours) at 06/04/2018 1823 Last data filed at 06/04/2018 1530 Gross per 24 hour  Intake 1357.5 ml  Output 1000 ml  Net 357.5 ml   Filed Weights   06/01/18 0726 06/01/18 1708  Weight: 117.9 kg (260 lb) 117.9 kg (260 lb)    Examination:  General exam: Alert, awake, oriented x 3 Respiratory system: Clear to auscultation. Respiratory effort normal. Cardiovascular system:RRR. No murmurs, rubs, gallops. Gastrointestinal system: Abdomen is nondistended, soft and nontender.  Significant hepatomegaly or masses felt. Normal bowel sounds heard. Central nervous system: Alert and oriented. No focal neurological deficits. Extremities: No C/C/E, +pedal pulses Skin: No rashes, lesions or ulcers Psychiatry: Judgement and insight appear normal. Mood & affect appropriate.     Data Reviewed: I have personally reviewed following labs and imaging studies  CBC: Recent Labs  Lab 06/01/18 0745 06/02/18 0451 06/03/18 0501 06/04/18 0618  WBC 6.5 5.3 11.5* 13.1*  HGB 13.5  13.7 14.2 13.9  HCT 38.6* 39.7 42.3 41.9  MCV 111.6* 113.4* 115.9* 116.1*  PLT 135* 124* 140* 973   Basic Metabolic Panel: Recent Labs  Lab 06/01/18 0745 06/01/18 0832 06/02/18 0451 06/03/18 0501 06/04/18 0414  NA 126*  --  132* 134* 133*  K 3.3*  --  4.3 4.9 4.9  CL 88*  --  96* 99 99  CO2 27  --  26 26 24   GLUCOSE 164*  --  206* 153* 146*  BUN 5*  --  5* 11 19  CREATININE 0.63  --  0.75 2.16* 3.63*  CALCIUM 7.9*  --  8.1* 8.2* 8.3*  MG  --  1.5*  --  1.9  --   PHOS  --   --   --   --  3.6   GFR: Estimated Creatinine Clearance: 33.1 mL/min (A) (by C-G formula based on SCr of 3.63 mg/dL (H)). Liver Function Tests: Recent Labs  Lab 06/01/18 0745 06/02/18 0451 06/03/18 0501 06/04/18 0414  AST 125* 118* 88*  --   ALT 41 40 38  --   ALKPHOS 151* 145* 155*  --   BILITOT 3.1* 2.5* 1.8*  --   PROT 6.0* 6.0* 6.3*  --   ALBUMIN 2.6* 2.5* 2.7* 2.5*   Recent Labs  Lab 06/01/18 0745  LIPASE 58*   Recent Labs  Lab 06/01/18 0831 06/02/18 0451 06/03/18 0501 06/04/18  0414  AMMONIA 60* 49* 33 51*   Coagulation Profile: Recent Labs  Lab 06/01/18 0831 06/04/18 1701  INR 1.23 1.14   Cardiac Enzymes: Recent Labs  Lab 06/04/18 0414  CKTOTAL 42*   BNP (last 3 results) No results for input(s): PROBNP in the last 8760 hours. HbA1C: No results for input(s): HGBA1C in the last 72 hours. CBG: No results for input(s): GLUCAP in the last 168 hours. Lipid Profile: No results for input(s): CHOL, HDL, LDLCALC, TRIG, CHOLHDL, LDLDIRECT in the last 72 hours. Thyroid Function Tests: Recent Labs    06/03/18 0501  FREET4 0.76*   Anemia Panel: No results for input(s): VITAMINB12, FOLATE, FERRITIN, TIBC, IRON, RETICCTPCT in the last 72 hours. Urine analysis:    Component Value Date/Time   COLORURINE YELLOW 06/03/2018 [place] 06/03/2018 1555   LABSPEC 1.017 06/03/2018 1555   PHURINE 5.0 06/03/2018 1555   GLUCOSEU NEGATIVE 06/03/2018 1555   HGBUR  NEGATIVE 06/03/2018 1555   BILIRUBINUR NEGATIVE 06/03/2018 1555   [place] 06/03/2018 1555   PROTEINUR NEGATIVE 06/03/2018 1555   NITRITE NEGATIVE 06/03/2018 1555   LEUKOCYTESUR NEGATIVE 06/03/2018 1555   Sepsis Labs: @LABRCNTIP (procalcitonin:4,lacticidven:4)  )No results found for this or any previous visit (from the past 240 hour(s)).       Radiology Studies: Ct Abdomen Pelvis W Contrast  Result Date: 06/02/2018 CLINICAL DATA:  Inpatient. Right upper quadrant abdominal pain with nausea and vomiting for 2 weeks. EXAM: CT ABDOMEN AND PELVIS WITH CONTRAST TECHNIQUE: Multidetector CT imaging of the abdomen and pelvis was performed using the standard protocol following bolus administration of intravenous contrast. CONTRAST:  16mL ISOVUE-300 IOPAMIDOL (ISOVUE-300) INJECTION 61% COMPARISON:  06/02/2018 abdominal sonogram. FINDINGS: Lower chest: No significant pulmonary nodules or acute consolidative airspace disease. Hepatobiliary: Hepatomegaly. Diffuse hepatic steatosis. No definite liver surface irregularity. No liver masses. Cholelithiasis. Nondistended gallbladder. No gallbladder wall thickening. No pericholecystic fluid. No biliary ductal dilatation. Pancreas: Normal, with no mass or duct dilation. Spleen: Normal size. No mass. Adrenals/Urinary Tract: Normal adrenals. Delayed contrast nephrograms bilaterally. No hydronephrosis. No renal masses. Normal nondistended bladder. Stomach/Bowel: Normal non-distended stomach. Normal caliber small bowel with no small bowel wall thickening. Normal appendix. Scattered mild colonic diverticulosis, with no large bowel wall thickening or significant pericolonic fat stranding. Vascular/Lymphatic: Atherosclerotic nonaneurysmal abdominal aorta. Patent portal, splenic, hepatic and renal veins. No pathologically enlarged lymph nodes in the abdomen or pelvis. Reproductive: Normal size prostate. Other: No pneumoperitoneum, ascites or focal fluid collection.  Musculoskeletal: No aggressive appearing focal osseous lesions. Moderate thoracolumbar spondylosis. Bilateral L5 pars defects. IMPRESSION: 1. Hepatomegaly. Diffuse hepatic steatosis. No definite morphologic changes of cirrhosis. 2. Cholelithiasis. No CT findings of acute cholecystitis. No biliary ductal dilatation. 3. No evidence of bowel obstruction or acute bowel inflammation. Mild colonic diverticulosis, with no evidence of acute diverticulitis. 4. Delayed contrast nephrograms bilaterally, suggesting nonspecific acute renal failure. No hydronephrosis. 5. Bilateral L5 pars defects. 6.  Aortic Atherosclerosis (ICD10-I70.0). Electronically Signed   By: Ilona Sorrel M.D.   On: 06/02/2018 20:49        Scheduled Meds: . acebutolol  400 mg Oral BID  . allopurinol  100 mg Oral Daily  . budesonide (PULMICORT) nebulizer solution  0.5 mg Nebulization BID  . folic acid  1 mg Oral Daily  . furosemide  40 mg Intravenous BID  . gabapentin  400 mg Oral TID  . ipratropium-albuterol  3 mL Nebulization TID  . lactulose  30 g Oral TID  . multivitamin with minerals  1 tablet Oral Daily  . naltrexone  50 mg Oral QHS  . nicotine  21 mg Transdermal Daily  . omega-3 acid ethyl esters  2 g Oral Daily  . pantoprazole  40 mg Oral Daily  . pneumococcal 23 valent vaccine  0.5 mL Intramuscular Tomorrow-1000  . thiamine  100 mg Oral Daily   Or  . thiamine  100 mg Intravenous Daily  . vitamin B-12  1,000 mcg Oral Daily   Continuous Infusions: . sodium chloride Stopped (06/03/18 1635)  . sodium chloride 75 mL/hr at 06/04/18 1218     LOS: 3 days    Time spent: 35 minutes. Greater than 50% of this time was spent in direct contact with the patient, coordinating care and discussing relevant ongoing clinical issues, including state of his hepatic and renal dysfunction, importance of alcohol cessation for his overall prognosis.  This time is independent of time spent for smoking cessation counseling.     Lelon Frohlich, MD [facility] Pager [phone]  If 7PM-7AM, please contact night-coverage [email or web address] Password [email or web address] 06/04/2018, 6:23 PM

## 2018-06-04 NOTE — Progress Notes (Signed)
Unable to perform CIWA as patient is resting comfortably.

## 2018-06-04 NOTE — Progress Notes (Signed)
Bradley Miles  MRN: [id number]  DOB/AGE: 06-09-1966 52 y.o.  Primary Care Physician:Luking, Grace Bushy, MD  Admit date: 06/01/2018  Chief Complaint:  Chief Complaint  Patient presents with  . Abdominal Pain    S-Pt presented on  06/01/2018 with  Chief Complaint  Patient presents with  . Abdominal Pain  .    Pt offers no new concerns.Pt is lethargic but does wake up and responds appropriately.    Pt gave hx of using Advil at home on regular basis in past week as he was having headaches   Meds . acebutolol  400 mg Oral BID  . allopurinol  100 mg Oral Daily  . budesonide (PULMICORT) nebulizer solution  0.5 mg Nebulization BID  . folic acid  1 mg Oral Daily  . furosemide  40 mg Intravenous BID  . gabapentin  400 mg Oral TID  . ipratropium-albuterol  3 mL Nebulization TID  . lactulose  30 g Oral BID  . multivitamin with minerals  1 tablet Oral Daily  . naltrexone  50 mg Oral QHS  . nicotine  21 mg Transdermal Daily  . omega-3 acid ethyl esters  2 g Oral Daily  . pantoprazole  40 mg Oral Daily  . pneumococcal 23 valent vaccine  0.5 mL Intramuscular Tomorrow-1000  . predniSONE  50 mg Oral Q breakfast  . thiamine  100 mg Oral Daily   Or  . thiamine  100 mg Intravenous Daily  . vitamin B-12  1,000 mcg Oral Daily       Physical Exam: Vital signs in last 24 hours: Temp:  [97.4 F (36.3 C)-98.3 F (36.8 C)] 97.6 F (36.4 C) (06/26 0400) Pulse Rate:  [81-90] 83 (06/26 0400) Resp:  [15-24] 15 (06/26 0400) BP: (114-128)/(73-84) 124/73 (06/26 0400) SpO2:  [92 %-98 %] 98 % (06/26 0400) Weight change:  Last BM Date: 06/03/18  Intake/Output from previous day: 06/25 0701 - 06/26 0700 In: 1246.3 [P.O.:720; I.V.:526.3] Out: -  No intake/output data recorded.   Physical Exam: General- pt is lethargic but arousable  Resp- No acute REsp distress,  NO Rhonchi, decreased at bases CVS- S1S2 regular in rate and rhythm GIT- BS+, soft, NT, ND EXT- 1+ LE Edema,  Cyanosis   Lab Results: CBC Recent Labs    06/02/18 0451 06/03/18 0501  WBC 5.3 11.5*  HGB 13.7 14.2  HCT 39.7 42.3  PLT 124* 140*    BMET Recent Labs    06/03/18 0501 06/04/18 0414  NA 134* 133*  K 4.9 4.9  CL 99 99  CO2 26 24  GLUCOSE 153* 146*  BUN 11 19  CREATININE 2.16* 3.63*  CALCIUM 8.2* 8.3*    Creat trend 2019 0.75=>2.1=>3.6   Lab Results  Component Value Date   CALCIUM 8.3 (L) 06/04/2018   CAION 1.17 09/18/2013   PHOS 3.6 06/04/2018               Impression: 1)Renal  AKI secondary to IV Contrast/NSAIDS/Hypovolemia                AKI sec to ATN                Oliguric ATN?                Upon further evaluation pt is NON Oliguric- 464ml output last night               AKi worseing  2)HTN  Medication- On Diuretics  3)Anemia HGb at goal (9--11)   4)CKD Mineral-Bone Disorder Phosphorus at goal. Calcium when corrected for low albumin is at goal.  5)CNS-admitted with AMS PMD following  6)Electrolytes Normokalemic Hyponatremic   7)Acid base Co2 at goal  8) GI-admitted with abdominal pain        Alcoholic hepatitis           Also had vomiting      Most likely alcoholic gastritis        Primary team following  9)CNS-admitted with AMS     Multifactorial     ETOH/LIver   Plan:  Will ask for bladder scan Will ask for external cath Will ask for CPK Will folow closely. I did educate pt and his family about possible need of renal replacement therapy      BHUTANI,MANPREET S 06/04/2018, 6:23 AM

## 2018-06-05 LAB — CBC
HEMATOCRIT: 42.8 % (ref 39.0–52.0)
HEMOGLOBIN: 14.6 g/dL (ref 13.0–17.0)
MCH: 39.2 pg — AB (ref 26.0–34.0)
MCHC: 34.1 g/dL (ref 30.0–36.0)
MCV: 115.1 fL — ABNORMAL HIGH (ref 78.0–100.0)
Platelets: 168 10*3/uL (ref 150–400)
RBC: 3.72 MIL/uL — AB (ref 4.22–5.81)
RDW: 13.9 % (ref 11.5–15.5)
WBC: 13 10*3/uL — ABNORMAL HIGH (ref 4.0–10.5)

## 2018-06-05 LAB — COMPREHENSIVE METABOLIC PANEL
ALK PHOS: 147 U/L — AB (ref 38–126)
ALT: 53 U/L — ABNORMAL HIGH (ref 0–44)
ANION GAP: 9 (ref 5–15)
AST: 112 U/L — ABNORMAL HIGH (ref 15–41)
Albumin: 2.8 g/dL — ABNORMAL LOW (ref 3.5–5.0)
BUN: 27 mg/dL — ABNORMAL HIGH (ref 6–20)
CALCIUM: 8.5 mg/dL — AB (ref 8.9–10.3)
CO2: 26 mmol/L (ref 22–32)
Chloride: 98 mmol/L (ref 98–111)
Creatinine, Ser: 4.12 mg/dL — ABNORMAL HIGH (ref 0.61–1.24)
GFR calc non Af Amer: 16 mL/min — ABNORMAL LOW (ref >60)
GFR, EST AFRICAN AMERICAN: 18 mL/min — AB (ref >60)
GLUCOSE: 109 mg/dL — AB (ref 70–99)
POTASSIUM: 5.2 mmol/L — AB (ref 3.5–5.1)
SODIUM: 133 mmol/L — AB (ref 135–145)
Total Bilirubin: 2 mg/dL — ABNORMAL HIGH (ref 0.3–1.2)
Total Protein: 6.2 g/dL — ABNORMAL LOW (ref 6.5–8.1)

## 2018-06-05 LAB — AMMONIA: AMMONIA: 33 umol/L (ref 9–35)

## 2018-06-05 MED ORDER — NITROGLYCERIN 0.4 MG SL SUBL
SUBLINGUAL_TABLET | SUBLINGUAL | Status: AC
  Administered 2018-06-05: 0.4 mg
  Filled 2018-06-05: qty 1

## 2018-06-05 MED ORDER — SUCRALFATE 1 GM/10ML PO SUSP
1.0000 g | Freq: Three times a day (TID) | ORAL | Status: DC
  Administered 2018-06-05 – 2018-06-09 (×17): 1 g via ORAL
  Filled 2018-06-05 (×17): qty 10

## 2018-06-05 MED ORDER — IPRATROPIUM-ALBUTEROL 0.5-2.5 (3) MG/3ML IN SOLN
3.0000 mL | Freq: Two times a day (BID) | RESPIRATORY_TRACT | Status: DC
  Administered 2018-06-06 – 2018-06-09 (×7): 3 mL via RESPIRATORY_TRACT
  Filled 2018-06-05 (×7): qty 3

## 2018-06-05 MED ORDER — GI COCKTAIL ~~LOC~~
30.0000 mL | Freq: Two times a day (BID) | ORAL | Status: DC | PRN
Start: 2018-06-05 — End: 2018-06-09
  Administered 2018-06-05: 30 mL via ORAL
  Filled 2018-06-05: qty 30

## 2018-06-05 MED ORDER — BISACODYL 10 MG RE SUPP
10.0000 mg | Freq: Once | RECTAL | Status: AC
  Administered 2018-06-05: 10 mg via RECTAL
  Filled 2018-06-05: qty 1

## 2018-06-05 MED ORDER — FUROSEMIDE 10 MG/ML IJ SOLN
80.0000 mg | Freq: Two times a day (BID) | INTRAMUSCULAR | Status: DC
  Administered 2018-06-05 – 2018-06-06 (×3): 80 mg via INTRAVENOUS
  Filled 2018-06-05 (×3): qty 8

## 2018-06-05 NOTE — Progress Notes (Signed)
Patient c/o abdominal pain and cramping during shift.  Patient states he would feel better if he would have a bowel movement.  Notified MD of difficulty passing stool, and received an order for suppository.  Given to patient per MD order.

## 2018-06-05 NOTE — Progress Notes (Signed)
Subjective: Interval History: has complaints of abdominal distention.  He remove his bowel that much.  He has some pain because of distention.  Denies any nausea or vomiting..  Objective: Vital signs in last 24 hours: Temp:  [97.6 F (36.4 C)-98.1 F (36.7 C)] 98.1 F (36.7 C) (06/27 0549) Pulse Rate:  [83-89] 87 (06/27 0549) Resp:  [16] 16 (06/27 0549) BP: (135-153)/(77-102) 138/93 (06/27 0549) SpO2:  [94 %-98 %] 96 % (06/27 0549) Weight change:   Intake/Output from previous day: 06/26 0701 - 06/27 0700 In: 2220 [P.O.:720; I.V.:1500] Out: 1775 [Urine:1775] Intake/Output this shift: No intake/output data recorded.  General appearance: alert, cooperative and no distress Resp: clear to auscultation bilaterally Cardio: regular rate and rhythm GI: Distended, nontender and positive bowel sounds.  His abdomen is active. Extremities: edema Trace to 1+ edema  Lab Results: Recent Labs    06/04/18 0618 06/05/18 0503  WBC 13.1* 13.0*  HGB 13.9 14.6  HCT 41.9 42.8  PLT 156 168   BMET:  Recent Labs    06/04/18 0414 06/05/18 0503  NA 133* 133*  K 4.9 5.2*  CL 99 98  CO2 24 26  GLUCOSE 146* 109*  BUN 19 27*  CREATININE 3.63* 4.12*  CALCIUM 8.3* 8.5*   No results for input(s): PTH in the last 72 hours. Iron Studies: No results for input(s): IRON, TIBC, TRANSFERRIN, FERRITIN in the last 72 hours.  Studies/Results: No results found.  I have reviewed the patient's current medications.  Assessment/Plan: 1] acute kidney injury: Possibly a combination of dye induced renal failure/ATN.  His renal function continue to get worse.  Patient however his urine output has improved.  Presently he does not have any nausea or vomiting. 2] hyponatremia: Hypervolemic hyponatremia sodium is stable. 3] abdominal distention: Possibly from constipation.  Presently complains of abdominal distention getting worse and having difficulty breathing.  Patient is on lactulose. 4] enlarged  liver/liver cirrhosis 5] hypertension: His blood pressure is reasonably controlled 6] history of alcohol abuse 7] history of COPD Plan: 1] increase Lasix to 80 mg IV twice daily 2] low potassium diet 3] we will check his renal panel in the morning.   LOS: 4 days   Janaa Acero S 06/05/2018,7:49 AM

## 2018-06-05 NOTE — Progress Notes (Signed)
PROGRESS NOTE    Bradley Miles  EHU:[id number] DOB: 12-04-1966 DOA: 06/01/2018 PCP: [name], MD     Brief Narrative:  52 year old man admitted from home on 6/23With a 3-week history of nausea, vomiting and abdominal pain, also with significant confusion.  He has a history of alcohol dependence and has been drinking two thirds of a gallon of whiskey daily with his last drink on 6/23 prior to arrival to the ED.  Patient was found to have elevated LFTs with a significant AST to ALT split, drug screen was negative, alcohol level was 255.  Past medical history significant for alcohol abuse, presumptive COPD, peripheral neuropathy and hypertension.  Admission was requested for further evaluation and management.   Assessment & Plan:   Active Problems:   TOBACCO ABUSE   Hypertension   EtOH dependence (HCC)   Sensory neuropathy   Prediabetes   Hepatic encephalopathy (HCC)   Acute respiratory failure with hypoxia (HCC)   Alcoholic cirrhosis of liver with ascites (HCC)   COPD with acute exacerbation (HCC)   Alcoholic peripheral neuropathy (HCC)   Acute metabolic encephalopathy -Believe secondary to acute alcohol intoxication and hyper ammonemia with early hepatic encephalopathy. -Ammonia level is again down today after increasing lactulose dose.   -He seems a bit more confused to me today, I believe this is a combination of hepatic encephalopathy as well as alcohol withdrawals. -He was thought to have acute alcoholic hepatitis, however to me his LFTs are much more indicative of alcohol use with his significant AST to ALT split.  Definitely not an obstructive pattern. -Maddrey's discriminant factor gives him a score of 8.7 which is a good prognosis, I do not believe he needs steroids for this reason.  As he has only been receiving prednisone for 2 days I believe it is safe to abruptly discontinue instead of tapering. -Abdominal ultrasound shows cholelithiasis without gallbladder  wall thickening and significant hepatic steatosis. -CT abdomen and pelvis shows hepatomegaly, hepatic steatosis and cholelithiasis without cholecystitis. -LFTs with slight increase, not significant; continue to trend. -Discontinue all acetaminophen products. -Will request GI consultation in am. -Significant abdominal distention today, likely ascites. Nephrology has discontinued IVF and increased lasix dosing as of 6/27.  Alcohol abuse -Continue thiamine, folate. -Continue to monitor for withdrawals on CIWA protocol, is receiving ativan per protocol. -Spent a significant amount of time with him on 6/26 discussing alcohol cessation and will ask social work to see him in the morning to provide resources.  Acute respiratory failure with hypoxemia -Suspect multifactorial due to COPD with acute exacerbation, OHS/OSA and possibly hypoventilation due to acute alcohol intoxication. -He has now been weaned to room air. -2D echocardiogram shows an ejection fraction of 60 to 65% with no wall motion abnormalities and trivial tricuspid regurgitation. -He has no wheezing on lung auscultation today, have discontinued steroids see above for details.  He does not need steroids for his COPD either.  Acute renal failure -Multifactorial suspect related to significant NSAID use at home and possibly contrast nephropathy. -Nephrology is following, appreciate their input and recommendations. Today they have stopped IVF and increased lasix to 80 mg IV BID. -Renal function is worse today up to 4.12 from 3.63 on 6/26 and 2.16 on 6/25.  Hyponatremia -Suspect due to volume depletion, sodium is stable at 133 today, continue to follow.  Intractable vomiting -Significantly improved, suspect related to alcoholic gastritis -PRN Zofran and PPI ordered, he is tolerating diet. -Carafate added.  Tobacco abuse -Tobacco cessation was discussed with  patient on 6/26.  Hyperglycemia -No history of diabetes, hemoglobin A1c is  5.5. -Suspect due to steroids and should improve now that we have discontinued them.    DVT prophylaxis: SCDs Code Status: Full code Family Communication: Multiple family members at bedside including sister and significant other updated on plan of care and all questions answered Disposition Plan: Home pending improvement in liver and renal function  Consultants:   Nephrology  Procedures:   2D echo as above  Antimicrobials:  Anti-infectives (From admission, onward)   None       Subjective: Lying in bed, more confused today. Falls asleep intermittently thruout our conversation.  Objective: Vitals:   06/04/18 2122 06/05/18 0549 06/05/18 0824 06/05/18 0830  BP: (!) 153/97 (!) 138/93 (!) 142/100 (!) 135/93  Pulse: 83 87 93 97  Resp:  16 20   Temp:  98.1 F (36.7 C) 97.9 F (36.6 C)   TempSrc:  Oral Oral   SpO2:  96% 92% 100%  Weight:      Height:        Intake/Output Summary (Last 24 hours) at 06/05/2018 0920 Last data filed at 06/05/2018 0300 Gross per 24 hour  Intake 1980 ml  Output 1375 ml  Net 605 ml   Filed Weights   06/01/18 0726 06/01/18 1708  Weight: 117.9 kg (260 lb) 117.9 kg (260 lb)    Examination:  General exam: drowsy Respiratory system: Clear to auscultation. Respiratory effort normal. Cardiovascular system:RRR. No murmurs, rubs, gallops. Gastrointestinal system: Abdomen is very distended, soft and nontender to palpation.  Normal bowel sounds heard. Central nervous system: Drowsy, moves all 4 spontaneously, halfway throughout our exam needed to go to the bathroom and was able to stand up and walk with minimal assistance to the restroom. Extremities: 2+ pitting edema bilaterally, positive pedal pulses Skin: No rashes, lesions or ulcers Psychiatry: Judgement and insight appear normal. Mood & affect appropriate.      Data Reviewed: I have personally reviewed following labs and imaging studies  CBC: Recent Labs  Lab 06/01/18 0745  06/02/18 0451 06/03/18 0501 06/04/18 0618 06/05/18 0503  WBC 6.5 5.3 11.5* 13.1* 13.0*  HGB 13.5 13.7 14.2 13.9 14.6  HCT 38.6* 39.7 42.3 41.9 42.8  MCV 111.6* 113.4* 115.9* 116.1* 115.1*  PLT 135* 124* 140* 156 983   Basic Metabolic Panel: Recent Labs  Lab 06/01/18 0745 06/01/18 0832 06/02/18 0451 06/03/18 0501 06/04/18 0414 06/05/18 0503  NA 126*  --  132* 134* 133* 133*  K 3.3*  --  4.3 4.9 4.9 5.2*  CL 88*  --  96* 99 99 98  CO2 27  --  26 26 24 26   GLUCOSE 164*  --  206* 153* 146* 109*  BUN 5*  --  5* 11 19 27*  CREATININE 0.63  --  0.75 2.16* 3.63* 4.12*  CALCIUM 7.9*  --  8.1* 8.2* 8.3* 8.5*  MG  --  1.5*  --  1.9  --   --   PHOS  --   --   --   --  3.6  --    GFR: Estimated Creatinine Clearance: 29.2 mL/min (A) (by C-G formula based on SCr of 4.12 mg/dL (H)). Liver Function Tests: Recent Labs  Lab 06/01/18 0745 06/02/18 0451 06/03/18 0501 06/04/18 0414 06/05/18 0503  AST 125* 118* 88*  --  112*  ALT 41 40 38  --  53*  ALKPHOS 151* 145* 155*  --  147*  BILITOT 3.1* 2.5* 1.8*  --  2.0*  PROT 6.0* 6.0* 6.3*  --  6.2*  ALBUMIN 2.6* 2.5* 2.7* 2.5* 2.8*   Recent Labs  Lab 06/01/18 0745  LIPASE 58*   Recent Labs  Lab 06/01/18 0831 06/02/18 0451 06/03/18 0501 06/04/18 0414 06/05/18 0503  AMMONIA 60* 49* 33 51* 33   Coagulation Profile: Recent Labs  Lab 06/01/18 0831 06/04/18 1701  INR 1.23 1.14   Cardiac Enzymes: Recent Labs  Lab 06/04/18 0414  CKTOTAL 42*   BNP (last 3 results) No results for input(s): PROBNP in the last 8760 hours. HbA1C: No results for input(s): HGBA1C in the last 72 hours. CBG: No results for input(s): GLUCAP in the last 168 hours. Lipid Profile: No results for input(s): CHOL, HDL, LDLCALC, TRIG, CHOLHDL, LDLDIRECT in the last 72 hours. Thyroid Function Tests: Recent Labs    06/03/18 0501  FREET4 0.76*   Anemia Panel: No results for input(s): VITAMINB12, FOLATE, FERRITIN, TIBC, IRON, RETICCTPCT in the last  72 hours. Urine analysis:    Component Value Date/Time   COLORURINE YELLOW 06/03/2018 [place] 06/03/2018 1555   LABSPEC 1.017 06/03/2018 1555   PHURINE 5.0 06/03/2018 1555   GLUCOSEU NEGATIVE 06/03/2018 1555   HGBUR NEGATIVE 06/03/2018 1555   BILIRUBINUR NEGATIVE 06/03/2018 1555   KETONESUR NEGATIVE 06/03/2018 1555   PROTEINUR NEGATIVE 06/03/2018 1555   NITRITE NEGATIVE 06/03/2018 1555   LEUKOCYTESUR NEGATIVE 06/03/2018 1555   Sepsis Labs: @LABRCNTIP (procalcitonin:4,lacticidven:4)  )No results found for this or any previous visit (from the past 240 hour(s)).       Radiology Studies: No results found.      Scheduled Meds: . acebutolol  400 mg Oral BID  . allopurinol  100 mg Oral Daily  . budesonide (PULMICORT) nebulizer solution  0.5 mg Nebulization BID  . folic acid  1 mg Oral Daily  . furosemide  80 mg Intravenous BID  . gabapentin  400 mg Oral TID  . ipratropium-albuterol  3 mL Nebulization TID  . lactulose  30 g Oral TID  . multivitamin with minerals  1 tablet Oral Daily  . naltrexone  50 mg Oral QHS  . nicotine  21 mg Transdermal Daily  . omega-3 acid ethyl esters  2 g Oral Daily  . pantoprazole  40 mg Oral Daily  . pneumococcal 23 valent vaccine  0.5 mL Intramuscular Tomorrow-1000  . sucralfate  1 g Oral TID WC & HS  . thiamine  100 mg Oral Daily   Or  . thiamine  100 mg Intravenous Daily  . vitamin B-12  1,000 mcg Oral Daily   Continuous Infusions: . sodium chloride Stopped (06/03/18 1635)     LOS: 4 days    Time spent: 50 minutes. Greater than 50% of this time was spent in direct contact with the patient and patient's family member, coordinating care and discussing relevant ongoing clinical issues, including state of his hepatic and renal dysfunction, importance of alcohol cessation for his overall prognosis.      Lelon Frohlich, MD [facility] Pager [phone]  If 7PM-7AM, please contact  night-coverage [email or web address] Password [email or web address] 06/05/2018, 9:20 AM

## 2018-06-06 DIAGNOSIS — G621 Alcoholic polyneuropathy: Secondary | ICD-10-CM

## 2018-06-06 DIAGNOSIS — J441 Chronic obstructive pulmonary disease with (acute) exacerbation: Secondary | ICD-10-CM

## 2018-06-06 DIAGNOSIS — K7010 Alcoholic hepatitis without ascites: Secondary | ICD-10-CM

## 2018-06-06 DIAGNOSIS — R7303 Prediabetes: Secondary | ICD-10-CM

## 2018-06-06 DIAGNOSIS — F10231 Alcohol dependence with withdrawal delirium: Secondary | ICD-10-CM

## 2018-06-06 DIAGNOSIS — R1011 Right upper quadrant pain: Secondary | ICD-10-CM

## 2018-06-06 DIAGNOSIS — R109 Unspecified abdominal pain: Secondary | ICD-10-CM

## 2018-06-06 LAB — CBC
HCT: 37.8 % — ABNORMAL LOW (ref 39.0–52.0)
Hemoglobin: 12.7 g/dL — ABNORMAL LOW (ref 13.0–17.0)
MCH: 38.6 pg — ABNORMAL HIGH (ref 26.0–34.0)
MCHC: 33.6 g/dL (ref 30.0–36.0)
MCV: 114.9 fL — ABNORMAL HIGH (ref 78.0–100.0)
Platelets: 122 10*3/uL — ABNORMAL LOW (ref 150–400)
RBC: 3.29 MIL/uL — AB (ref 4.22–5.81)
RDW: 14 % (ref 11.5–15.5)
WBC: 12.2 10*3/uL — AB (ref 4.0–10.5)

## 2018-06-06 LAB — RENAL FUNCTION PANEL
Albumin: 2.4 g/dL — ABNORMAL LOW (ref 3.5–5.0)
Anion gap: 9 (ref 5–15)
BUN: 36 mg/dL — ABNORMAL HIGH (ref 6–20)
CALCIUM: 8.1 mg/dL — AB (ref 8.9–10.3)
CO2: 27 mmol/L (ref 22–32)
CREATININE: 3.46 mg/dL — AB (ref 0.61–1.24)
Chloride: 98 mmol/L (ref 98–111)
GFR, EST AFRICAN AMERICAN: 22 mL/min — AB (ref >60)
GFR, EST NON AFRICAN AMERICAN: 19 mL/min — AB (ref >60)
Glucose, Bld: 85 mg/dL (ref 70–99)
PHOSPHORUS: 5.1 mg/dL — AB (ref 2.5–4.6)
Potassium: 4.6 mmol/L (ref 3.5–5.1)
SODIUM: 134 mmol/L — AB (ref 135–145)

## 2018-06-06 LAB — HEPATIC FUNCTION PANEL
ALBUMIN: 2.4 g/dL — AB (ref 3.5–5.0)
ALT: 40 U/L (ref 0–44)
AST: 82 U/L — ABNORMAL HIGH (ref 15–41)
Alkaline Phosphatase: 117 U/L (ref 38–126)
Bilirubin, Direct: 1.6 mg/dL — ABNORMAL HIGH (ref 0.0–0.2)
Indirect Bilirubin: 1.4 mg/dL — ABNORMAL HIGH (ref 0.3–0.9)
TOTAL PROTEIN: 5.5 g/dL — AB (ref 6.5–8.1)
Total Bilirubin: 3 mg/dL — ABNORMAL HIGH (ref 0.3–1.2)

## 2018-06-06 LAB — PROTIME-INR
INR: 1.35
PROTHROMBIN TIME: 16.6 s — AB (ref 11.4–15.2)

## 2018-06-06 MED ORDER — FUROSEMIDE 10 MG/ML IJ SOLN
40.0000 mg | Freq: Two times a day (BID) | INTRAMUSCULAR | Status: DC
  Administered 2018-06-06 – 2018-06-09 (×6): 40 mg via INTRAVENOUS
  Filled 2018-06-06 (×6): qty 4

## 2018-06-06 NOTE — Consult Note (Signed)
Referring Provider: [facility] Primary Care Physician:  [name], MD Primary Gastroenterologist:  Dr. Gala Romney  Date of Admission: 06/01/18 Date of Consultation: 06/06/18  Reason for Consultation:  Elevated LFTs, confusion  HPI:  Bradley Miles is a 52 y.o. male with a past medical history of alcohol abuse, presumptive COPD, peripheral neuropathy, hypertension.  He presented to the emergency department with 3-week history of nausea, vomiting, abdominal pain.  History of consistent alcohol use for 20+ years (per social history documentation).  Recently had been drinking 2/3 gallon of whiskey daily.  His previous drink was 623 prior to arrival in the ED.  He was intoxicated with alcohol level of 255, noted elevated LFTs with AST:ALT ratio of approximately 3:1.  He has been admitted since 70 and was felt to have early hepatic encephalopathy.  His ammonia levels have been checked and he has been on lactulose.  He seems a bit more confused yesterday at which point we are consulted.  Maji his discriminant function was calculated by the hospitalist to be 8.7 and steroids were not given.  Ultrasound of the abdomen showed cholelithiasis without gallbladder wall thickening and no dilation of common bile duct.  Noted significant hepatic steatosis making much of the liver unable to be visualized.  CT of the abdomen and pelvis did demonstrate hepatomegaly and hepatic steatosis.  His AST/ALT have improved and today are 82/40.  His bilirubin has started to climb again and is 3.0 today compared to 2.0 yesterday.  This shows elevation of both direct and indirect bilirubin.  Albumin is low at 2.4.  He also has mild to moderate leukocytosis at 12.2 today with a peak of 13.12 days ago.  Platelets are suppressed today at 122.  Acute kidney injury with nephrology on board.  His creatinine is starting to improve and is down to 3.46 today.  Today he is accompanied at his bedside by his sister and his wife  who aid in the history. The patient seems alert and oriented today. Confirms at least 20 year history of EtOH; past 3 months has been drinking a half gallon of Whiskey daily. Has been to [employer] [facility] and AA for alcohol cessation. Last visit he made I to 80 days sober. He states this illness is a "real wake-up call" and he's determined to get sober. His abdominal pain, N/V all started about 2-3 weeks ago. He notes he had some intermittent confusion at work about a month ago and this has persisted but became significanlty more confused over the past week. Family has noted jaundice for the past week. One episode of hematochezia a week ago. One episode of melena a week ago (which his wife disputes). Tremors when not drinking. Denies fever, chills.  Today he has significant RUQ pain, N/V improved. Small liquid stool middle of the night and again this morning. No other GI concerns.  Past Medical History:  Diagnosis Date  . Anxiety   . Enlarged liver   . GERD (gastroesophageal reflux disease)   . Hypertension   . Palpitations   . PVC's (premature ventricular contractions)   . Shortness of breath   . Varicose veins     Past Surgical History:  Procedure Laterality Date  . COLONOSCOPY WITH ESOPHAGOGASTRODUODENOSCOPY (EGD)  12/16/2012   Procedure: COLONOSCOPY WITH ESOPHAGOGASTRODUODENOSCOPY (EGD);  Surgeon: Daneil Dolin, MD;  Location: [facility];  Service: Endoscopy;  Laterality: N/A;  7:30  . cyst reomved     from spine  . ESOPHAGOGASTRODUODENOSCOPY  05/19/09  RMR: Geographic distal esophageal erosions consistent with severe erosive reflux esophagitis. schatzki's ring s/P dilation/small hiatal hernia otherwise normal stomach  . WISDOM TOOTH EXTRACTION      Prior to Admission medications   Medication Sig Start Date End Date Taking? Authorizing Provider  acebutolol (SECTRAL) 400 MG capsule TAKE 1 CAPSULE BY MOUTH TWICE A DAY 03/31/18  Yes [name], MD  albuterol (PROVENTIL  HFA;VENTOLIN HFA) 108 (90 Base) MCG/ACT inhaler Inhale 2 puffs into the lungs every 6 (six) hours as needed for wheezing or shortness of breath. 05/14/17  Yes [name], MD  allopurinol (ZYLOPRIM) 100 MG tablet TAKE 1 TABLET BY MOUTH ONCE DAILY 03/31/18  Yes [name], MD  colchicine 0.6 MG tablet take 1 tablet by mouth once daily Patient taking differently: take 1 tablet by mouth once daily as needed 06/21/17  Yes [name], MD  dexlansoprazole (DEXILANT) 60 MG capsule Take 1 capsule (60 mg total) by mouth daily. 04/15/17  Yes [name], MD  enalapril (VASOTEC) 10 MG tablet Take 1 tablet (10 mg total) by mouth daily. 04/07/18  Yes [name], MD  folic acid (FOLVITE) 1 MG tablet Take 1 mg by mouth daily.   Yes [provider]  gabapentin (NEURONTIN) 300 MG capsule TAKE ONE CAPSULE BY MOUTH FOUR TIMES DAILY 03/31/18  Yes [name], MD  Omega-3 Fatty Acids (FISH OIL PO) Take 3 capsules by mouth daily.   Yes [provider]  ondansetron (ZOFRAN ODT) 4 MG disintegrating tablet One po Q 8 hours prn nausea 05/22/18  Yes [name], MD  sucralfate (CARAFATE) 1 g tablet One po Ac 05/22/18  Yes [name], MD  valACYclovir (VALTREX) 1000 MG tablet 2 tablets BID for 1 day 03/15/16  Yes [name], MD  VIAGRA 100 MG tablet take 1/2 tablet by mouth once daily 12/04/17  Yes [name], MD  vitamin B-12 (CYANOCOBALAMIN) 1000 MCG tablet Take 1,000 mcg by mouth daily.   Yes [provider]  naltrexone (DEPADE) 50 MG tablet Take 1 tablet (50 mg total) by mouth at bedtime. Patient not taking: Reported on 06/01/2018 11/25/17   [name], MD    Current Facility-Administered Medications  Medication Dose Route Frequency Provider Last Rate Last Dose  . acebutolol (SECTRAL) capsule 400 mg  400 mg Oral BID Orson Eva, MD   400 mg at 06/05/18 2136  . allopurinol (ZYLOPRIM) tablet 100 mg  100 mg Oral Daily Tat, David, MD    100 mg at 06/06/18 0814  . budesonide (PULMICORT) nebulizer solution 0.5 mg  0.5 mg Nebulization BID Tat, David, MD   0.5 mg at 06/06/18 0809  . folic acid (FOLVITE) tablet 1 mg  1 mg Oral Daily Tat, David, MD   1 mg at 06/06/18 0813  . furosemide (LASIX) injection 40 mg  40 mg Intravenous BID Fran Lowes, MD      . gabapentin (NEURONTIN) capsule 400 mg  400 mg Oral TID Orson Eva, MD   400 mg at 06/05/18 1647  . gi cocktail (Maalox,Lidocaine,Donnatal)  30 mL Oral BID PRN Isaac Bliss, Rayford Halsted, MD   30 mL at 06/05/18 0901  . ipratropium-albuterol (DUONEB) 0.5-2.5 (3) MG/3ML nebulizer solution 3 mL  3 mL Nebulization BID Isaac Bliss, Rayford Halsted, MD   3 mL at 06/06/18 0809  . lactulose (CHRONULAC) 10 GM/15ML solution 30 g  30 g Oral TID Isaac Bliss, Rayford Halsted, MD  30 g at 06/06/18 0815  . LORazepam (ATIVAN) injection 1 mg  1 mg Intravenous Q4H PRN Orson Eva, MD   1 mg at 06/06/18 0809   Or  . LORazepam (ATIVAN) tablet 1 mg  1 mg Oral Q4H PRN Orson Eva, MD   1 mg at 06/04/18 0149  . multivitamin with minerals tablet 1 tablet  1 tablet Oral Daily Tat, David, MD   1 tablet at 06/06/18 0813  . naltrexone (DEPADE) tablet 50 mg  50 mg Oral QHS Orson Eva, MD   50 mg at 06/05/18 2136  . nicotine (NICODERM CQ - dosed in mg/24 hours) patch 21 mg  21 mg Transdermal Daily Tat, Shanon Brow, MD   21 mg at 06/06/18 0816  . omega-3 acid ethyl esters (LOVAZA) capsule 2 g  2 g Oral Daily Tat, David, MD   2 g at 06/06/18 1610  . ondansetron (ZOFRAN) tablet 4 mg  4 mg Oral Q6H PRN Tat, David, MD       Or  . ondansetron (ZOFRAN) injection 4 mg  4 mg Intravenous Q6H PRN Orson Eva, MD   4 mg at 06/05/18 0801  . oxyCODONE (Oxy IR/ROXICODONE) immediate release tablet 5 mg  5 mg Oral Q6H PRN Isaac Bliss, Rayford Halsted, MD   5 mg at 06/06/18 0334  . pantoprazole (PROTONIX) EC tablet 40 mg  40 mg Oral Daily Tat, David, MD   40 mg at 06/06/18 0814  . pneumococcal 23 valent vaccine (PNU-IMMUNE) injection 0.5  mL  0.5 mL Intramuscular Tomorrow-1000 Tat, David, MD      . sucralfate (CARAFATE) 1 GM/10ML suspension 1 g  1 g Oral TID WC & HS Isaac Bliss, Rayford Halsted, MD   1 g at 06/06/18 0815  . thiamine (VITAMIN B-1) tablet 100 mg  100 mg Oral Daily Tat, David, MD   100 mg at 06/06/18 9604   Or  . thiamine (B-1) injection 100 mg  100 mg Intravenous Daily Tat, David, MD      . vitamin B-12 (CYANOCOBALAMIN) tablet 1,000 mcg  1,000 mcg Oral Daily Tat, David, MD   1,000 mcg at 06/06/18 5409    Allergies as of 06/01/2018  . (No Known Allergies)    Family History  Problem Relation Age of Onset  . Cancer Mother        ?etiology  . Heart disease Mother   . Colon polyps Sister 63       two sisters  . Liver disease Neg Hx     Social History   Socioeconomic History  . Marital status: Significant Other    Spouse name: Not on file  . Number of children: 2  . Years of education: Not on file  . Highest education level: Not on file  Occupational History  . 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 Financial resource strain: Not on file  . Food insecurity:    Worry: Not on file    Inability: Not on file  . Transportation needs:    Medical: Not on file    Non-medical: Not on file  Tobacco Use  . Smoking status: Current Every Day Smoker    Packs/day: 1.50    Years: 30.00    Pack years: 45.00    Types: Cigarettes    Start date: 12/24/1979  . Smokeless tobacco: Never Used  Substance and Sexual Activity  . Alcohol use: Yes    Alcohol/week: 0.0 oz  Comment: consumes beer or liquor three to four times weekly. Up to six pack or three shots each occurence or more x 20+ yrs  . Drug use: No  . Sexual activity: Yes    Partners: Female    Birth control/protection: None  Lifestyle  . Physical activity:    Days per week: Not on file    Minutes per session: Not on file  . Stress: Not on file  Relationships  . Social connections:    Talks on phone: Not  on file    Gets together: Not on file    Attends religious service: Not on file    Active member of club or organization: Not on file    Attends meetings of clubs or organizations: Not on file    Relationship status: Not on file  . Intimate partner violence:    Fear of current or ex partner: Not on file    Emotionally abused: Not on file    Physically abused: Not on file    Forced sexual activity: Not on file  Other Topics Concern  . Not on file  Social History Narrative   Lives alone  Girlfriend & kids 1/2 time    Review of Systems: General: Negative for anorexia, weight loss, fever, chills, fatigue, weakness. ENT: Negative for hoarseness, difficulty swallowing , nasal congestion. CV: Negative for chest pain, angina, palpitations, dyspnea on exertion, peripheral edema.  Respiratory: Negative for dyspnea at rest, dyspnea on exertion, cough, sputum, wheezing.  GI: See history of present illness. MS: Negative for joint pain, low back pain.  Derm: Negative for rash or itching.  Neuro: Admits recent confusion.  Endo: Negative for unusual weight change.  Heme: Negative for bruising or bleeding. Allergy: Negative for rash or hives.  Physical Exam: Vital signs in last 24 hours: Temp:  [98.6 F (37 C)-99.3 F (37.4 C)] 98.7 F (37.1 C) (06/28 0655) Pulse Rate:  [92-98] 96 (06/28 0655) Resp:  [15-20] 15 (06/27 2120) BP: (119-132)/(75-96) 119/75 (06/28 0655) SpO2:  [89 %-97 %] 97 % (06/28 0809) Last BM Date: 06/04/18 General:   Alert,  Well-developed, well-nourished, pleasant and cooperative in NAD Head:  Normocephalic and atraumatic. Eyes:  Sclera clear, very mild icterus. Conjunctiva pink. Ears:  Normal auditory acuity. Neck:  Supple; no masses or thyromegaly. Lungs:  Clear throughout to auscultation.  No wheezes, crackles, or rhonchi. No acute distress. Heart:  Regular rate and rhythm; no murmurs, clicks, rubs,  or gallops. Abdomen:  Soft, and nondistended. RUQ TTP, liver  enlarged. No masses, splenomegaly or hernias noted. Normal bowel sounds, without guarding, and without rebound.   Rectal:  Deferred.   Msk:  Symmetrical without gross deformities. Pulses:  Normal bilateral DP pulses noted. Extremities:  Without clubbing. Noted bilateral LE edema 2+ Neurologic:  Alert and  oriented x4;  grossly normal neurologically, but sleepy. Skin:  Intact without significant lesions or rashes. Psych:  Alert and cooperative. Normal mood and affect.  Intake/Output from previous day: 06/27 0701 - 06/28 0700 In: 1240 [P.O.:790; I.V.:450] Out: 2775 [Urine:2775] Intake/Output this shift: No intake/output data recorded.  Lab Results: Recent Labs    06/04/18 0618 06/05/18 0503 06/06/18 0555  WBC 13.1* 13.0* 12.2*  HGB 13.9 14.6 12.7*  HCT 41.9 42.8 37.8*  PLT 156 168 122*   BMET Recent Labs    06/04/18 0414 06/05/18 0503 06/06/18 0555  NA 133* 133* 134*  K 4.9 5.2* 4.6  CL 99 98 98  CO2 24 26 27   GLUCOSE  146* 109* 85  BUN 19 27* 36*  CREATININE 3.63* 4.12* 3.46*  CALCIUM 8.3* 8.5* 8.1*   LFT Recent Labs    06/04/18 0414 06/05/18 0503 06/06/18 0555  PROT  --  6.2* 5.5*  ALBUMIN 2.5* 2.8* 2.4*  2.4*  AST  --  112* 82*  ALT  --  53* 40  ALKPHOS  --  147* 117  BILITOT  --  2.0* 3.0*  BILIDIR  --   --  1.6*  IBILI  --   --  1.4*   PT/INR Recent Labs    06/04/18 1701  LABPROT 14.5  INR 1.14   Hepatitis Panel No results for input(s): HEPBSAG, HCVAB, HEPAIGM, HEPBIGM in the last 72 hours. C-Diff No results for input(s): CDIFFTOX in the last 72 hours.  Studies/Results: No results found.  Impression: 52 year old male with a 20+ year history of alcohol consumption.  He has been through rehab twice and has been sober as long as 87 days.  He is currently drinking between half a gallon to three quarters of a gallon of whiskey daily.  Began having abdominal pain, nausea, vomiting, yellowing of the skin/eyes, confusion in the past 2 to 4 weeks.   Questions element of melena although his wife has not seen this.  He does have tremors when he is not drinking likely due to alcohol withdrawal.  He is currently awake and oriented.  He is a bit sleepy and has dozed off while always talking with his family.  Confusion likely due to alcohol withdrawal and less likely to hepatic encephalopathy, especially given normal ammonia.  Overall with his leukocytosis (although differential not available), elevated bilirubin, AST to ALT ratio, gross acute on chronic hepatomegaly he likely has acute alcoholic hepatitis.  His discriminant function is not elevated on admission.  His LFTs have improved but his bilirubin is lagging.  He does have acute kidney injury as well, query element of hepatorenal with acute alcoholic hepatitis.  Nephrology is on board.  I discussed very straightforward with him that he will not have a normal life expectancy if he continues to drink.  At this point the best chance he has for a good outcome and a good life quality is to quit drinking.  I offered assistance with rehab and other treatments.  His wife feels that this is been the "wake-up call he has needed."  He states he is wanting to stop drinking.  He has had some serologies ordered related to other causes of liver disease.  He is not overtly cirrhotic.  However, I will fill in the halls and check hepatitis a and B full serologies, INR (for overall liver function).  Plan: 1. Absolute alcohol cessation 2. Hepatitis A IgM, hepatitis B antibody IgM, INR 3. Recheck hepatic function in the morning 4. Supportive measures 5. Appreciate nephrology input, further treatment per their recommendations 6. No Acetaminophen at this time. (No other overtly hepatotoxic medications noted on IP medication list)    Thank you for allowing [place] to participate in the care of Donnelly Angelica, DNP, AGNP-C Adult & Gerontological Nurse Practitioner [facility]   ADDENDUM: INR mild increase to 1.35; DF increased to 9.4, still no indication for steroid treatment.   LOS: 5 days     06/06/2018, 8:41 AM

## 2018-06-06 NOTE — Progress Notes (Signed)
Subjective: Interval History: Patient is still complains of some abdominal pain but feeling better.  He has some nausea but no vomiting.  He denies any difficulty breathing.  Objective: Vital signs in last 24 hours: Temp:  [97.9 F (36.6 C)-99.3 F (37.4 C)] 98.7 F (37.1 C) (06/28 0655) Pulse Rate:  [92-98] 96 (06/28 0655) Resp:  [15-20] 15 (06/27 2120) BP: (119-142)/(75-100) 119/75 (06/28 0655) SpO2:  [89 %-100 %] 93 % (06/28 0655) Weight change:   Intake/Output from previous day: 06/27 0701 - 06/28 0700 In: 1240 [P.O.:790; I.V.:450] Out: 2775 [Urine:2775] Intake/Output this shift: No intake/output data recorded.  General appearance: alert, cooperative and no distress Resp: clear to auscultation bilaterally Cardio: regular rate and rhythm GI: Distended, nontender and positive bowel sounds.  His abdomen is active. Extremities: edema Trace to 1+ edema  Lab Results: Recent Labs    06/05/18 0503 06/06/18 0555  WBC 13.0* 12.2*  HGB 14.6 12.7*  HCT 42.8 37.8*  PLT 168 122*   BMET:  Recent Labs    06/05/18 0503 06/06/18 0555  NA 133* 134*  K 5.2* 4.6  CL 98 98  CO2 26 27  GLUCOSE 109* 85  BUN 27* 36*  CREATININE 4.12* 3.46*  CALCIUM 8.5* 8.1*   No results for input(s): PTH in the last 72 hours. Iron Studies: No results for input(s): IRON, TIBC, TRANSFERRIN, FERRITIN in the last 72 hours.  Studies/Results: No results found.  I have reviewed the patient's current medications.  Assessment/Plan: 1] acute kidney injury: Possibly a combination of dye induced renal failure/ATN/NSAIDS his renal function started improving.  Patient had 2700 cc of urine output.. 2] hyponatremia: Hypervolemic hyponatremia sodium is improving. 3] abdominal distention: He has some pain but improving.  Patient is able to move his bowels. 4] enlarged liver/liver cirrhosis 5] hypertension: His blood pressure is reasonably controlled 6] history of alcohol abuse 7] history of COPD Plan:  1] we will decrease Lasix to 40 mg IV twice daily 2] we will check his renal panel in the morning.   LOS: 5 days   Shanyn Preisler S 06/06/2018,8:15 AM

## 2018-06-06 NOTE — Progress Notes (Signed)
PROGRESS NOTE    Bradley Miles  CVE:[id number] DOB: [birth year] DOA: 06/01/2018 PCP: [name], MD     Brief Narrative:  52 year old man admitted from home on 6/23With a 3-week history of nausea, vomiting and abdominal pain, also with significant confusion.  He has a history of alcohol dependence and has been drinking two thirds of a gallon of whiskey daily with his last drink on 6/23 prior to arrival to the ED.  Patient was found to have elevated LFTs with a significant AST to ALT split, drug screen was negative, alcohol level was 255.  Past medical history significant for alcohol abuse, presumptive COPD, peripheral neuropathy and hypertension.  Admission was requested for further evaluation and management.   Assessment & Plan:   Active Problems:   TOBACCO ABUSE   Hypertension   EtOH dependence (HCC)   Sensory neuropathy   Prediabetes   Hepatic encephalopathy (HCC)   Acute respiratory failure with hypoxia (HCC)   Alcoholic cirrhosis of liver with ascites (HCC)   COPD with acute exacerbation (HCC)   Alcoholic peripheral neuropathy (HCC)   Acute metabolic encephalopathy -Believe secondary to acute alcohol intoxication and hyper ammonemia with early hepatic encephalopathy. -Ammonia level is in normal range. -He seems a bit more confused to me today, I believe this is a combination of hepatic encephalopathy as well as alcohol withdrawals. -He was thought to have acute alcoholic hepatitis, however to me his LFTs are much more indicative of alcohol use with his significant AST to ALT split.  Definitely not an obstructive pattern. -Maddrey's discriminant factor gives him a score of 8.7 which is a good prognosis, I do not believe he needs steroids for this reason.  -Abdominal ultrasound shows cholelithiasis without gallbladder wall thickening and significant hepatic steatosis. -CT abdomen and pelvis shows hepatomegaly, hepatic steatosis and cholelithiasis without  cholecystitis. -LFTs improved today not significant; continue to trend. -GI to see per family request. -Discontinue all acetaminophen products. -Significant abdominal distention noted, likely ascites. Nephrology has discontinued IVF and initiated lasix as of 6/27.  Alcohol abuse -Continue thiamine, folate. -Continue to monitor for withdrawals on CIWA protocol, is receiving ativan per protocol. -Spent a significant amount of time with him on 6/26 discussing alcohol cessation and will ask social work to see him in the morning to provide resources.  Acute respiratory failure with hypoxemia -Suspect multifactorial due to COPD with acute exacerbation, OHS/OSA and possibly hypoventilation due to acute alcohol intoxication. -He has now been weaned to room air. -2D echocardiogram shows an ejection fraction of 60 to 65% with no wall motion abnormalities and trivial tricuspid regurgitation. -He has no wheezing on lung auscultation today, have discontinued steroids see above for details.  He does not need steroids for his COPD either.  Acute renal failure -Multifactorial suspect related to significant NSAID use at home and possibly contrast nephropathy. -Nephrology is following, appreciate their input and recommendations. Today lasix has been decreased to 40 mg IV BID. -Renal function is improving: down to 3.46 from 4.12 on 6/27.  Hyponatremia -Suspect due to volume depletion, sodium is stable at 134 today, continue to follow.  Intractable vomiting -Significantly improved, suspect related to alcoholic gastritis -PRN Zofran and PPI ordered, he is tolerating diet. -Carafate added.  Tobacco abuse -Tobacco cessation was discussed with patient on 6/26.  Hyperglycemia -No history of diabetes, hemoglobin A1c is 5.5. -Suspect due to steroids and should improve now that we have discontinued them (It has).    DVT prophylaxis: SCDs Code Status: Full  code Family Communication: Multiple family  members at bedside including sister and significant other updated on plan of care and all questions answered on 6/27. Disposition Plan: Home pending improvement in liver and renal function  Consultants:   Nephrology  Procedures:   2D echo as above  Antimicrobials:  Anti-infectives (From admission, onward)   None       Subjective: Lying in bed, speaking on the phone, more awake today.  Only mild right upper quadrant pain.  Objective: Vitals:   06/05/18 2034 06/05/18 2120 06/06/18 0655 06/06/18 0809  BP:  123/86 119/75   Pulse:  98 96   Resp:  15    Temp:  99.3 F (37.4 C) 98.7 F (37.1 C)   TempSrc:  Oral Oral   SpO2: 93% 93% 93% 97%  Weight:      Height:        Intake/Output Summary (Last 24 hours) at 06/06/2018 0848 Last data filed at 06/06/2018 0100 Gross per 24 hour  Intake 1000 ml  Output 2775 ml  Net -1775 ml   Filed Weights   06/01/18 0726 06/01/18 1708  Weight: 117.9 kg (260 lb) 117.9 kg (260 lb)    Examination:  General exam: Alert, awake, oriented x 3 Respiratory system: Clear to auscultation. Respiratory effort normal. Cardiovascular system:RRR. No murmurs, rubs, gallops. Gastrointestinal system: Abdomen is distended, positive fluid wave soft and nontender to palpation. No organomegaly or masses felt. Normal bowel sounds heard. Central nervous system: Alert and oriented. No focal neurological deficits. Extremities: 2+ pitting edema bilaterally, +pedal pulses Skin: No rashes, lesions or ulcers Psychiatry: Judgement and insight appear normal. Mood & affect appropriate.      Data Reviewed: I have personally reviewed following labs and imaging studies  CBC: Recent Labs  Lab 06/02/18 0451 06/03/18 0501 06/04/18 0618 06/05/18 0503 06/06/18 0555  WBC 5.3 11.5* 13.1* 13.0* 12.2*  HGB 13.7 14.2 13.9 14.6 12.7*  HCT 39.7 42.3 41.9 42.8 37.8*  MCV 113.4* 115.9* 116.1* 115.1* 114.9*  PLT 124* 140* 156 168 992*   Basic Metabolic Panel: Recent  Labs  Lab 06/01/18 0832 06/02/18 0451 06/03/18 0501 06/04/18 0414 06/05/18 0503 06/06/18 0555  NA  --  132* 134* 133* 133* 134*  K  --  4.3 4.9 4.9 5.2* 4.6  CL  --  96* 99 99 98 98  CO2  --  26 26 24 26 27   GLUCOSE  --  206* 153* 146* 109* 85  BUN  --  5* 11 19 27* 36*  CREATININE  --  0.75 2.16* 3.63* 4.12* 3.46*  CALCIUM  --  8.1* 8.2* 8.3* 8.5* 8.1*  MG 1.5*  --  1.9  --   --   --   PHOS  --   --   --  3.6  --  5.1*   GFR: Estimated Creatinine Clearance: 34.7 mL/min (A) (by C-G formula based on SCr of 3.46 mg/dL (H)). Liver Function Tests: Recent Labs  Lab 06/01/18 0745 06/02/18 0451 06/03/18 0501 06/04/18 0414 06/05/18 0503 06/06/18 0555  AST 125* 118* 88*  --  112* 82*  ALT 41 40 38  --  53* 40  ALKPHOS 151* 145* 155*  --  147* 117  BILITOT 3.1* 2.5* 1.8*  --  2.0* 3.0*  PROT 6.0* 6.0* 6.3*  --  6.2* 5.5*  ALBUMIN 2.6* 2.5* 2.7* 2.5* 2.8* 2.4*  2.4*   Recent Labs  Lab 06/01/18 0745  LIPASE 58*   Recent Labs  Lab 06/01/18  0831 06/02/18 0451 06/03/18 0501 06/04/18 0414 06/05/18 0503  AMMONIA 60* 49* 33 51* 33   Coagulation Profile: Recent Labs  Lab 06/01/18 0831 06/04/18 1701  INR 1.23 1.14   Cardiac Enzymes: Recent Labs  Lab 06/04/18 0414  CKTOTAL 42*   BNP (last 3 results) No results for input(s): PROBNP in the last 8760 hours. HbA1C: No results for input(s): HGBA1C in the last 72 hours. CBG: No results for input(s): GLUCAP in the last 168 hours. Lipid Profile: No results for input(s): CHOL, HDL, LDLCALC, TRIG, CHOLHDL, LDLDIRECT in the last 72 hours. Thyroid Function Tests: No results for input(s): TSH, T4TOTAL, FREET4, T3FREE, THYROIDAB in the last 72 hours. Anemia Panel: No results for input(s): VITAMINB12, FOLATE, FERRITIN, TIBC, IRON, RETICCTPCT in the last 72 hours. Urine analysis:    Component Value Date/Time   COLORURINE YELLOW 06/03/2018 [place] 06/03/2018 1555   LABSPEC 1.017 06/03/2018 1555   PHURINE  5.0 06/03/2018 1555   GLUCOSEU NEGATIVE 06/03/2018 1555   HGBUR NEGATIVE 06/03/2018 1555   BILIRUBINUR NEGATIVE 06/03/2018 1555   [place] 06/03/2018 1555   PROTEINUR NEGATIVE 06/03/2018 1555   NITRITE NEGATIVE 06/03/2018 1555   LEUKOCYTESUR NEGATIVE 06/03/2018 1555   Sepsis Labs: @LABRCNTIP (procalcitonin:4,lacticidven:4)  )No results found for this or any previous visit (from the past 240 hour(s)).       Radiology Studies: No results found.      Scheduled Meds: . acebutolol  400 mg Oral BID  . allopurinol  100 mg Oral Daily  . budesonide (PULMICORT) nebulizer solution  0.5 mg Nebulization BID  . folic acid  1 mg Oral Daily  . furosemide  40 mg Intravenous BID  . gabapentin  400 mg Oral TID  . ipratropium-albuterol  3 mL Nebulization BID  . lactulose  30 g Oral TID  . multivitamin with minerals  1 tablet Oral Daily  . naltrexone  50 mg Oral QHS  . nicotine  21 mg Transdermal Daily  . omega-3 acid ethyl esters  2 g Oral Daily  . pantoprazole  40 mg Oral Daily  . pneumococcal 23 valent vaccine  0.5 mL Intramuscular Tomorrow-1000  . sucralfate  1 g Oral TID WC & HS  . thiamine  100 mg Oral Daily   Or  . thiamine  100 mg Intravenous Daily  . vitamin B-12  1,000 mcg Oral Daily   Continuous Infusions:    LOS: 5 days    Time spent: 25 minutes.    Lelon Frohlich, MD [facility] Pager [phone]  If 7PM-7AM, please contact night-coverage [email or web address] Password [facility] 06/06/2018, 8:48 AM

## 2018-06-07 LAB — COMPREHENSIVE METABOLIC PANEL
ALBUMIN: 2.4 g/dL — AB (ref 3.5–5.0)
ALT: 32 U/L (ref 0–44)
AST: 64 U/L — AB (ref 15–41)
Alkaline Phosphatase: 114 U/L (ref 38–126)
Anion gap: 8 (ref 5–15)
BUN: 34 mg/dL — ABNORMAL HIGH (ref 6–20)
CHLORIDE: 97 mmol/L — AB (ref 98–111)
CO2: 30 mmol/L (ref 22–32)
CREATININE: 2.18 mg/dL — AB (ref 0.61–1.24)
Calcium: 7.8 mg/dL — ABNORMAL LOW (ref 8.9–10.3)
GFR calc Af Amer: 39 mL/min — ABNORMAL LOW (ref >60)
GFR, EST NON AFRICAN AMERICAN: 34 mL/min — AB (ref >60)
GLUCOSE: 107 mg/dL — AB (ref 70–99)
POTASSIUM: 3.7 mmol/L (ref 3.5–5.1)
Sodium: 135 mmol/L (ref 135–145)
Total Bilirubin: 2.8 mg/dL — ABNORMAL HIGH (ref 0.3–1.2)
Total Protein: 5.6 g/dL — ABNORMAL LOW (ref 6.5–8.1)

## 2018-06-07 LAB — CBC WITH DIFFERENTIAL/PLATELET
Basophils Absolute: 0 10*3/uL (ref 0.0–0.1)
Basophils Relative: 0 %
EOS ABS: 0.3 10*3/uL (ref 0.0–0.7)
EOS PCT: 3 %
HCT: 37.4 % — ABNORMAL LOW (ref 39.0–52.0)
Hemoglobin: 12.7 g/dL — ABNORMAL LOW (ref 13.0–17.0)
LYMPHS PCT: 8 %
Lymphs Abs: 0.8 10*3/uL (ref 0.7–4.0)
MCH: 38.5 pg — ABNORMAL HIGH (ref 26.0–34.0)
MCHC: 34 g/dL (ref 30.0–36.0)
MCV: 113.3 fL — AB (ref 78.0–100.0)
MONO ABS: 2.5 10*3/uL — AB (ref 0.1–1.0)
MONOS PCT: 24 %
Neutro Abs: 7.1 10*3/uL (ref 1.7–7.7)
Neutrophils Relative %: 65 %
PLATELETS: 107 10*3/uL — AB (ref 150–400)
RBC: 3.3 MIL/uL — AB (ref 4.22–5.81)
RDW: 14.3 % (ref 11.5–15.5)
WBC: 10.7 10*3/uL — ABNORMAL HIGH (ref 4.0–10.5)

## 2018-06-07 LAB — HEPATITIS B CORE ANTIBODY, IGM: HEP B C IGM: NEGATIVE

## 2018-06-07 LAB — HEPATITIS A ANTIBODY, IGM: HEP A IGM: NEGATIVE

## 2018-06-07 MED ORDER — POLYETHYLENE GLYCOL 3350 17 G PO PACK
17.0000 g | PACK | Freq: Two times a day (BID) | ORAL | Status: DC
  Administered 2018-06-07 – 2018-06-08 (×3): 17 g via ORAL
  Filled 2018-06-07 (×4): qty 1

## 2018-06-07 MED ORDER — POLYETHYLENE GLYCOL 3350 17 G PO PACK: 17.0000 g | PACK | Freq: Every day | ORAL | Status: DC

## 2018-06-07 NOTE — Progress Notes (Signed)
PROGRESS NOTE    Bradley Miles  FBP:[id number] DOB: Apr 09, 1966 DOA: 06/01/2018 PCP: [name], MD     Brief Narrative:  52 year old man admitted from home on 6/23With a 3-week history of nausea, vomiting and abdominal pain, also with significant confusion.  He has a history of alcohol dependence and has been drinking two thirds of a gallon of whiskey daily with his last drink on 6/23 prior to arrival to the ED.  Patient was found to have elevated LFTs with a significant AST to ALT split, drug screen was negative, alcohol level was 255.  Past medical history significant for alcohol abuse, presumptive COPD, peripheral neuropathy and hypertension.  Admission was requested for further evaluation and management.   Assessment & Plan:   Active Problems:   TOBACCO ABUSE   Hypertension   EtOH dependence ([place])   Sensory neuropathy   Prediabetes   Hepatic encephalopathy (HCC)   Acute respiratory failure with hypoxia (HCC)   Alcoholic cirrhosis of liver with ascites (HCC)   COPD with acute exacerbation (HCC)   Alcoholic peripheral neuropathy (HCC)   Abdominal pain   Alcoholic hepatitis without ascites   Acute metabolic encephalopathy -Believe secondary to acute alcohol intoxication and hyper ammonemia with early hepatic encephalopathy and a component of alcohol withdrawals. -Ammonia level is in normal range. -Confusion is definitely improving. -Acute alcoholic hepatitis is what everything is pointing towards.  He does not have evidence of cirrhosis on imaging studies or of portal hypertension, LFTs and renal function are all improving. -Maddrey's discriminant factor on admission gives him a score of 8.7 which is a good prognosis, I do not believe he needs steroids for this reason.  -Abdominal ultrasound shows cholelithiasis without gallbladder wall thickening and significant hepatic steatosis. -CT abdomen and pelvis shows hepatomegaly, hepatic steatosis and cholelithiasis without  cholecystitis. -LFTs need to improve. -GI to see per family request. -Discontinue all acetaminophen products.  Alcohol abuse -Continue thiamine, folate. -Continue to monitor for withdrawals on CIWA protocol, is receiving ativan per protocol. -Spent a significant amount of time with him on 6/26 discussing alcohol cessation and will ask social work to see him  to provide resources.  Acute respiratory failure with hypoxemia -Suspect multifactorial due to COPD with acute exacerbation, OHS/OSA and possibly hypoventilation due to acute alcohol intoxication. -He has now been weaned to room air. -2D echocardiogram shows an ejection fraction of 60 to 65% with no wall motion abnormalities and trivial tricuspid regurgitation. -He has no wheezing on lung auscultation today, have discontinued steroids see above for details.  He does not need steroids for his COPD either.  Acute renal failure -Multifactorial suspect related to significant NSAID use at home and possibly contrast nephropathy.  Hepatorenal syndrome is also not completely ruled out although unlikely. -Nephrology is following, appreciate their input and recommendations.  Plan to continue Lasix dose at 40 mg IV twice daily.  -Renal function is improving: down to 2.18 on 6/29 from 3.46 on 6/28 and 4.12 on 6/27.  Hyponatremia -Suspect due to volume depletion, sodium is stable at 135 today, continue to follow.  Intractable vomiting -Significantly improved, suspect related to alcoholic gastritis -PRN Zofran and PPI ordered, he is tolerating diet. -Carafate added.  Tobacco abuse -Tobacco cessation was discussed with patient on 6/26.  Hyperglycemia -No history of diabetes, hemoglobin A1c is 5.5. -Suspect due to steroids and should improve now that we have discontinued them (It has).    DVT prophylaxis: SCDs Code Status: Full code Family Communication: Multiple family  members at bedside including sister and significant other updated on  plan of care and all questions answered on 6/29. Disposition Plan: Home pending improvement in liver and renal function.  Request PT evaluation when available.  Consultants:   Nephrology  Procedures:   2D echo as above  Antimicrobials:  Anti-infectives (From admission, onward)   None       Subjective: In bed, drowsy but arouses easily to voice, states he is still weak but his pain has improved.  Objective: Vitals:   06/06/18 2323 06/06/18 2337 06/07/18 0557 06/07/18 0754  BP:  125/82 124/84   Pulse:  99 97   Resp:  18 19   Temp: 98.6 F (37 C) 99.3 F (37.4 C) 98.6 F (37 C)   TempSrc: Oral Oral Oral   SpO2:  94% 90% (!) 89%  Weight:      Height:        Intake/Output Summary (Last 24 hours) at 06/07/2018 1044 Last data filed at 06/07/2018 0851 Gross per 24 hour  Intake 840 ml  Output 550 ml  Net 290 ml   Filed Weights   06/01/18 0726 06/01/18 1708  Weight: 117.9 kg (260 lb) 117.9 kg (260 lb)    Examination:  General exam: Alert, awake, oriented x 3 Respiratory system: Clear to auscultation. Respiratory effort normal. Cardiovascular system:RRR. No murmurs, rubs, gallops. Gastrointestinal system: Abdomen is distended, soft and nontender. Normal bowel sounds heard. Central nervous system: Alert and oriented. No focal neurological deficits. Extremities: 1+ pitting edema bilaterally, +pedal pulses Skin: No rashes, lesions or ulcers Psychiatry: Judgement and insight appear normal. Mood & affect appropriate.       Data Reviewed: I have personally reviewed following labs and imaging studies  CBC: Recent Labs  Lab 06/02/18 0451 06/03/18 0501 06/04/18 0618 06/05/18 0503 06/06/18 0555  WBC 5.3 11.5* 13.1* 13.0* 12.2*  HGB 13.7 14.2 13.9 14.6 12.7*  HCT 39.7 42.3 41.9 42.8 37.8*  MCV 113.4* 115.9* 116.1* 115.1* 114.9*  PLT 124* 140* 156 168 320*   Basic Metabolic Panel: Recent Labs  Lab 06/01/18 0832  06/03/18 0501 06/04/18 0414 06/05/18 0503  06/06/18 0555 06/07/18 0639  NA  --    < > 134* 133* 133* 134* 135  K  --    < > 4.9 4.9 5.2* 4.6 3.7  CL  --    < > 99 99 98 98 97*  CO2  --    < > 26 24 26 27 30   GLUCOSE  --    < > 153* 146* 109* 85 107*  BUN  --    < > 11 19 27* 36* 34*  CREATININE  --    < > 2.16* 3.63* 4.12* 3.46* 2.18*  CALCIUM  --    < > 8.2* 8.3* 8.5* 8.1* 7.8*  MG 1.5*  --  1.9  --   --   --   --   PHOS  --   --   --  3.6  --  5.1*  --    < > = values in this interval not displayed.   GFR: Estimated Creatinine Clearance: 55.1 mL/min (A) (by C-G formula based on SCr of 2.18 mg/dL (H)). Liver Function Tests: Recent Labs  Lab 06/02/18 0451 06/03/18 0501 06/04/18 0414 06/05/18 0503 06/06/18 0555 06/07/18 0639  AST 118* 88*  --  112* 82* 64*  ALT 40 38  --  53* 40 32  ALKPHOS 145* 155*  --  147* 117 114  BILITOT 2.5* 1.8*  --  2.0* 3.0* 2.8*  PROT 6.0* 6.3*  --  6.2* 5.5* 5.6*  ALBUMIN 2.5* 2.7* 2.5* 2.8* 2.4*  2.4* 2.4*   Recent Labs  Lab 06/01/18 0745  LIPASE 58*   Recent Labs  Lab 06/01/18 0831 06/02/18 0451 06/03/18 0501 06/04/18 0414 06/05/18 0503  AMMONIA 60* 49* 33 51* 33   Coagulation Profile: Recent Labs  Lab 06/01/18 0831 06/04/18 1701 06/06/18 1307  INR 1.23 1.14 1.35   Cardiac Enzymes: Recent Labs  Lab 06/04/18 0414  CKTOTAL 42*   BNP (last 3 results) No results for input(s): PROBNP in the last 8760 hours. HbA1C: No results for input(s): HGBA1C in the last 72 hours. CBG: No results for input(s): GLUCAP in the last 168 hours. Lipid Profile: No results for input(s): CHOL, HDL, LDLCALC, TRIG, CHOLHDL, LDLDIRECT in the last 72 hours. Thyroid Function Tests: No results for input(s): TSH, T4TOTAL, FREET4, T3FREE, THYROIDAB in the last 72 hours. Anemia Panel: No results for input(s): VITAMINB12, FOLATE, FERRITIN, TIBC, IRON, RETICCTPCT in the last 72 hours. Urine analysis:    Component Value Date/Time   COLORURINE YELLOW 06/03/2018 [place]  06/03/2018 1555   LABSPEC 1.017 06/03/2018 1555   PHURINE 5.0 06/03/2018 1555   GLUCOSEU NEGATIVE 06/03/2018 1555   HGBUR NEGATIVE 06/03/2018 1555   BILIRUBINUR NEGATIVE 06/03/2018 1555   [place] 06/03/2018 1555   PROTEINUR NEGATIVE 06/03/2018 1555   NITRITE NEGATIVE 06/03/2018 1555   LEUKOCYTESUR NEGATIVE 06/03/2018 1555   Sepsis Labs: @LABRCNTIP (procalcitonin:4,lacticidven:4)  )No results found for this or any previous visit (from the past 240 hour(s)).       Radiology Studies: No results found.      Scheduled Meds: . acebutolol  400 mg Oral BID  . allopurinol  100 mg Oral Daily  . budesonide (PULMICORT) nebulizer solution  0.5 mg Nebulization BID  . folic acid  1 mg Oral Daily  . furosemide  40 mg Intravenous BID  . gabapentin  400 mg Oral TID  . ipratropium-albuterol  3 mL Nebulization BID  . lactulose  30 g Oral TID  . multivitamin with minerals  1 tablet Oral Daily  . naltrexone  50 mg Oral QHS  . nicotine  21 mg Transdermal Daily  . omega-3 acid ethyl esters  2 g Oral Daily  . pantoprazole  40 mg Oral Daily  . pneumococcal 23 valent vaccine  0.5 mL Intramuscular Tomorrow-1000  . sucralfate  1 g Oral TID WC & HS  . thiamine  100 mg Oral Daily   Or  . thiamine  100 mg Intravenous Daily  . vitamin B-12  1,000 mcg Oral Daily   Continuous Infusions:    LOS: 6 days    Time spent: 25 minutes.    Lelon Frohlich, MD [facility] Pager [phone]  If 7PM-7AM, please contact night-coverage [email or web address] Password [facility] 06/07/2018, 10:44 AM

## 2018-06-07 NOTE — Progress Notes (Signed)
Subjective: Interval History: He has a still some abdominal pain.  He does not have any nausea or vomiting.  Objective: Vital signs in last 24 hours: Temp:  [97.5 F (36.4 C)-99.3 F (37.4 C)] 98.6 F (37 C) (06/29 0557) Pulse Rate:  [94-99] 97 (06/29 0557) Resp:  [18-19] 19 (06/29 0557) BP: (117-125)/(76-84) 124/84 (06/29 0557) SpO2:  [89 %-94 %] 89 % (06/29 0754) Weight change:   Intake/Output from previous day: 06/28 0701 - 06/29 0700 In: 1080 [P.O.:1080] Out: -  Intake/Output this shift: Total I/O In: -  Out: 550 [Urine:550]  General appearance: alert, cooperative and no distress Resp: clear to auscultation bilaterally Cardio: regular rate and rhythm GI: Distended, nontender and positive bowel sounds.  His abdomen is active. Extremities: edema Trace to 1+ edema  Lab Results: Recent Labs    06/05/18 0503 06/06/18 0555  WBC 13.0* 12.2*  HGB 14.6 12.7*  HCT 42.8 37.8*  PLT 168 122*   BMET:  Recent Labs    06/06/18 0555 06/07/18 0639  NA 134* 135  K 4.6 3.7  CL 98 97*  CO2 27 30  GLUCOSE 85 107*  BUN 36* 34*  CREATININE 3.46* 2.18*  CALCIUM 8.1* 7.8*   No results for input(s): PTH in the last 72 hours. Iron Studies: No results for input(s): IRON, TIBC, TRANSFERRIN, FERRITIN in the last 72 hours.  Studies/Results: No results found.  I have reviewed the patient's current medications.  Assessment/Plan: 1] acute kidney injury: Possibly a combination of dye induced renal failure/ATN/NSAIDS.  His renal function is progressively improving. 2] hyponatremia: His sodium is normal. 3] abdominal distention: He has some pain but improving.  Complains of some abdominal pain otherwise feels okay. 4] enlarged liver/liver cirrhosis 5] hypertension: His blood pressure is reasonably controlled 6] history of alcohol abuse 7] history of COPD Plan: 1] we will continue his present management 2] we will check his renal panel in the morning.   LOS: 6 days    Adam Demary S 06/07/2018,9:18 AM

## 2018-06-07 NOTE — Progress Notes (Signed)
Subjective: Sister, Bradley Miles at bedside Multiple questions this morning from family regarding progress. Family reporting Tmax of 101.6 last night around 10 pm, with ice packs placed. Not recorded in epic. Tmax recorded is 99.3. Family curious about need for blood cultures. Patient feels weak but overall improved from admission. Notes abdominal bloating and gas. Small BM this morning and small BM yesterday around 3pm. No appetite for the last month. Was able to eat cheerios and small banana this morning. No confusion. Abdominal distension improved from admission per patient and sister. Feels softer today.    Objective: Vital signs in last 24 hours: Temp:  [97.5 F (36.4 C)-99.3 F (37.4 C)] 98.9 F (37.2 C) (06/29 0900) Pulse Rate:  [94-99] 97 (06/29 0557) Resp:  [18-19] 19 (06/29 0557) BP: (117-125)/(76-84) 124/84 (06/29 0557) SpO2:  [89 %-94 %] 89 % (06/29 0754) Last BM Date: 06/06/18 General:   Drowsy but awakens easily. Oriented X 4.  Abdomen:  +BS, more hypoactive LUQ but still present, distended but soft. TTP RUQ. No rebound or guarding  Extremities:  1+ edema bilateral lower extremities  Neurologic:  Oriented X 4   Intake/Output from previous day: 06/28 0701 - 06/29 0700 In: 1080 [P.O.:1080] Out: -  Intake/Output this shift: Total I/O In: 120 [P.O.:120] Out: 550 [Urine:550]  Lab Results: Recent Labs    06/05/18 0503 06/06/18 0555 06/07/18 0639  WBC 13.0* 12.2* 10.7*  HGB 14.6 12.7* 12.7*  HCT 42.8 37.8* 37.4*  PLT 168 122* 107*   BMET Recent Labs    06/05/18 0503 06/06/18 0555 06/07/18 0639  NA 133* 134* 135  K 5.2* 4.6 3.7  CL 98 98 97*  CO2 26 27 30   GLUCOSE 109* 85 107*  BUN 27* 36* 34*  CREATININE 4.12* 3.46* 2.18*  CALCIUM 8.5* 8.1* 7.8*   LFT Recent Labs    06/05/18 0503 06/06/18 0555 06/07/18 0639  PROT 6.2* 5.5* 5.6*  ALBUMIN 2.8* 2.4*  2.4* 2.4*  AST 112* 82* 64*  ALT 53* 40 32  ALKPHOS 147* 117 114  BILITOT 2.0* 3.0* 2.8*  BILIDIR   --  1.6*  --   IBILI  --  1.4*  --    PT/INR Recent Labs    06/04/18 1701 06/06/18 1307  LABPROT 14.5 16.6*  INR 1.14 1.35   Hepatitis Panel Recent Labs    06/06/18 1307  HEPAIGM Negative  HEPBIGM Negative    Assessment: 52 year old male with history of ETOH abuse, presenting with N/V/abdominal pain, and confusion.   Encephalopathy: multifactorial in setting of alcohol withdrawal, which is largest contributor. Less likely hepatic encephalopathy. LFTs improving, no confusion today.   Elevated LFTs: in setting of ETOH abuse. No prednisolone needed due to low discriminant function. Mild alcoholic hepatitis. As of note, no findings of splenomegaly or concern for advanced chronic liver disease; he does have diffuse hepatic steatosis however. Hep B core antibody and Hep A antibody IgM negative. Hep C antibody negative. Hep B surface antigen pending. AST improving, Tbili may have more of a lag effect but now trending down.   AKI: appreciate nephrology involvement. Creatinine improving.   Fever: per sister, temperature was 101.6 overnight; however, no documentation of this in flowsheets. I reviewed with day shift nurse as well. Repeat CBC with improved mild leukocytosis, which is non-specific. Will follow and inform hospitalist as well, as this is not documented in medical records. Nursing staff aware to monitor and document/inform of any febrile events.   Abdominal bloating: secondary to  constipation, no ascites appreciated, taking lactulose but without good results. I feel this may be more bothersome with side effects of discomfort, gas, etc. Low concern for hepatic encephalopathy. May tolerate Miralax better to help facilitate BMs. Abdominal exam without concerning findings and abdominal distension improved per family at bedside, patient, and nursing staff who is familiar with patient.   Plan: ETOH cessation Recheck INR tomorrow, along with CBC, HFP Will follow-up on pending Hep B  surface antigen as comes available Appreciate nephrology involvement Discontinue lactulose for now, add Miralax Monitor for febrile episodes: nursing staff aware Will reassess in am   Bradley Needs, PhD, ANP-BC [facility]     LOS: 6 days    06/07/2018, 1:01 PM

## 2018-06-08 LAB — CBC
HEMATOCRIT: 37.4 % — AB (ref 39.0–52.0)
Hemoglobin: 12.9 g/dL — ABNORMAL LOW (ref 13.0–17.0)
MCH: 39 pg — ABNORMAL HIGH (ref 26.0–34.0)
MCHC: 34.5 g/dL (ref 30.0–36.0)
MCV: 113 fL — AB (ref 78.0–100.0)
Platelets: 139 10*3/uL — ABNORMAL LOW (ref 150–400)
RBC: 3.31 MIL/uL — ABNORMAL LOW (ref 4.22–5.81)
RDW: 13.8 % (ref 11.5–15.5)
WBC: 8.2 10*3/uL (ref 4.0–10.5)

## 2018-06-08 LAB — HEPATIC FUNCTION PANEL
ALBUMIN: 2.4 g/dL — AB (ref 3.5–5.0)
ALK PHOS: 119 U/L (ref 38–126)
ALT: 30 U/L (ref 0–44)
AST: 59 U/L — AB (ref 15–41)
Bilirubin, Direct: 1.9 mg/dL — ABNORMAL HIGH (ref 0.0–0.2)
Indirect Bilirubin: 1.6 mg/dL — ABNORMAL HIGH (ref 0.3–0.9)
TOTAL PROTEIN: 6.1 g/dL — AB (ref 6.5–8.1)
Total Bilirubin: 3.5 mg/dL — ABNORMAL HIGH (ref 0.3–1.2)

## 2018-06-08 LAB — RENAL FUNCTION PANEL
Albumin: 2.4 g/dL — ABNORMAL LOW (ref 3.5–5.0)
Anion gap: 10 (ref 5–15)
BUN: 26 mg/dL — ABNORMAL HIGH (ref 6–20)
CHLORIDE: 94 mmol/L — AB (ref 98–111)
CO2: 31 mmol/L (ref 22–32)
Calcium: 7.9 mg/dL — ABNORMAL LOW (ref 8.9–10.3)
Creatinine, Ser: 1.48 mg/dL — ABNORMAL HIGH (ref 0.61–1.24)
GFR calc Af Amer: >60 mL/min (ref >60)
GFR, EST NON AFRICAN AMERICAN: 54 mL/min — AB (ref >60)
Glucose, Bld: 100 mg/dL — ABNORMAL HIGH (ref 70–99)
POTASSIUM: 3.6 mmol/L (ref 3.5–5.1)
Phosphorus: 3 mg/dL (ref 2.5–4.6)
Sodium: 135 mmol/L (ref 135–145)

## 2018-06-08 LAB — PROTIME-INR
INR: 1.25
Prothrombin Time: 15.6 seconds — ABNORMAL HIGH (ref 11.4–15.2)

## 2018-06-08 NOTE — Progress Notes (Signed)
Hep B surface antigen was ordered as outpatient. Inpatient lab at [facility] has contacted [facility] and this is to be processed again with specimen already on file. Appreciate assistance by lab here at [facility]. Hopefully Hep B surface antigen will be back in 24 hours.

## 2018-06-08 NOTE — Progress Notes (Signed)
Patient refused to take full dose of Acebutolol 400 mg. Patient took Acebutolol 200 mg tonight. Mid Level Notified with no response. Will continue to monitor.

## 2018-06-08 NOTE — Progress Notes (Signed)
Patient requesting Acebutolol 200 mg instead of his scheduled dose of Acebutolol 400 mg due to low blood pressure during the day today. Mid Level Notified.

## 2018-06-08 NOTE — Progress Notes (Signed)
    Subjective: Abdominal distension improved, less discomfort. +flatus. BM yesterday. Appetite is fair. No confusion.   Objective: Vital signs in last 24 hours: Temp:  [98.3 F (36.8 C)-99.4 F (37.4 C)] 99.2 F (37.3 C) (06/30 0134) Pulse Rate:  [93-96] 96 (06/30 0134) Resp:  [18-20] 18 (06/30 0134) BP: (108-116)/(67-72) 116/72 (06/30 0134) SpO2:  [87 %-96 %] 87 % (06/30 0816) Last BM Date: 06/07/18 General:   Alert and oriented, pleasant Abdomen:  Bowel sounds present, mildly protuberant but soft, mild TTP RUQ but improved from yesterday. No rebound or guarding Extremities:  Without edema. Neurologic:  Alert and  oriented x4  Intake/Output from previous day: 06/29 0701 - 06/30 0700 In: 640 [P.O.:640] Out: 550 [Urine:550] Intake/Output this shift: No intake/output data recorded.  Lab Results: Recent Labs    06/06/18 0555 06/07/18 0639 06/08/18 0637  WBC 12.2* 10.7* 8.2  HGB 12.7* 12.7* 12.9*  HCT 37.8* 37.4* 37.4*  PLT 122* 107* 139*   BMET Recent Labs    06/06/18 0555 06/07/18 0639 06/08/18 0637  NA 134* 135 135  K 4.6 3.7 3.6  CL 98 97* 94*  CO2 27 30 31   GLUCOSE 85 107* 100*  BUN 36* 34* 26*  CREATININE 3.46* 2.18* 1.48*  CALCIUM 8.1* 7.8* 7.9*   LFT Recent Labs    06/06/18 0555 06/07/18 0639 06/08/18 0637  PROT 5.5* 5.6* 6.1*  ALBUMIN 2.4*  2.4* 2.4* 2.4*  2.4*  AST 82* 64* 59*  ALT 40 32 30  ALKPHOS 117 114 119  BILITOT 3.0* 2.8* 3.5*  BILIDIR 1.6*  --  1.9*  IBILI 1.4*  --  1.6*   PT/INR Recent Labs    06/06/18 1307 06/08/18 0637  LABPROT 16.6* 15.6*  INR 1.35 1.25   Hepatitis Panel Recent Labs    06/06/18 1307  HEPAIGM Negative  HEPBIGM Negative    Assessment: 52 year old male with history of ETOH abuse, presenting with N/V/abdominal pain, and confusion.   Encephalopathy: multifactorial in setting of alcohol withdrawal, which is largest contributor. Less likely hepatic encephalopathy contributing.   Elevated LFTs:  in setting of ETOH abuse. No prednisolone needed due to low discriminant function. Mild alcoholic hepatitis. As of note, no findings of splenomegaly or concern for advanced chronic liver disease; he does have diffuse hepatic steatosis however. Hep B core antibody and Hep A antibody IgM negative. Hep C antibody negative. Hep B surface antigen remains pending, and I have called the lab regarding this. Bilirubin increased but likely to fluctuate: AST improved and ALT normal. Will continue to follow.   AKI: appreciate nephrology involvement. Creatinine improving.   Abdominal bloating: improved after discontinuing lactulose and starting Miralax. BM yesterday. No ascites appreciated.  Hopeful discharge in next 24 hours if continues to improve. Will need to follow LFTs as outpatient.   Plan: ETOH cessation Repeat HFP in am Laboratory investing Hep B surface antigen results as not available yet Appreciate Nephrology Hopeful discharge tomorrow if continues to improve Will arrange close interval follow-up as outpatient Will reassess tomorrow morning  Annitta Needs, PhD, ANP-BC [facility]    LOS: 7 days    06/08/2018, 9:41 AM

## 2018-06-08 NOTE — Progress Notes (Signed)
Subjective: Interval History: He has a still some abdominal pain..  The pain is however much better.  He denies any difficulty breathing.  Objective: Vital signs in last 24 hours: Temp:  [98.3 F (36.8 C)-99.4 F (37.4 C)] 99.2 F (37.3 C) (06/30 0134) Pulse Rate:  [93-96] 96 (06/30 0134) Resp:  [18-20] 18 (06/30 0134) BP: (108-116)/(67-72) 116/72 (06/30 0134) SpO2:  [87 %-96 %] 87 % (06/30 0816) Weight change:   Intake/Output from previous day: 06/29 0701 - 06/30 0700 In: 640 [P.O.:640] Out: 550 [Urine:550] Intake/Output this shift: No intake/output data recorded.  Generally: Patient is alert and in no apparent distress Chest is clear to auscultation Heart exam revealed regular rate and rhythm no murmur Abdomen: Distended, positive bowel sound and mild tenderness Extremities no edema  Lab Results: Recent Labs    06/07/18 0639 06/08/18 0637  WBC 10.7* 8.2  HGB 12.7* 12.9*  HCT 37.4* 37.4*  PLT 107* 139*   BMET:  Recent Labs    06/07/18 0639 06/08/18 0637  NA 135 135  K 3.7 3.6  CL 97* 94*  CO2 30 31  GLUCOSE 107* 100*  BUN 34* 26*  CREATININE 2.18* 1.48*  CALCIUM 7.8* 7.9*   No results for input(s): PTH in the last 72 hours. Iron Studies: No results for input(s): IRON, TIBC, TRANSFERRIN, FERRITIN in the last 72 hours.  Studies/Results: No results found.  I have reviewed the patient's current medications.  Assessment/Plan: 1] acute kidney injury: Possibly a combination of dye induced renal failure/ATN/NSAIDS.  His renal function is progressively improving.  Patient is a symptomatic. 2] hyponatremia: His sodium is normal. 3] abdominal distention: Continue to have some pain but improving. 4] enlarged liver/liver cirrhosis 5] hypertension: His blood pressure is reasonably controlled 6] history of alcohol abuse 7] history of COPD: He is on inhaler and his breathing is much better. 8] hyperkalemia: Potassium is normal. Plan: 1] we will continue his  present management 2] we will check his renal panel in the morning.   LOS: 7 days   Jossue Rubenstein S 06/08/2018,8:40 AM

## 2018-06-08 NOTE — Progress Notes (Signed)
PROGRESS NOTE    Bradley Miles  BVQ:[id number] DOB: 12/13/1965 DOA: 06/01/2018 PCP: [name], MD     Brief Narrative:  52 year old man admitted from home on 6/23With a 3-week history of nausea, vomiting and abdominal pain, also with significant confusion.  He has a history of alcohol dependence and has been drinking two thirds of a gallon of whiskey daily with his last drink on 6/23 prior to arrival to the ED.  Patient was found to have elevated LFTs with a significant AST to ALT split, drug screen was negative, alcohol level was 255.  Past medical history significant for alcohol abuse, presumptive COPD, peripheral neuropathy and hypertension.  Admission was requested for further evaluation and management.   Assessment & Plan:   Active Problems:   TOBACCO ABUSE   Hypertension   EtOH dependence ([place])   Sensory neuropathy   Prediabetes   Hepatic encephalopathy (HCC)   Acute respiratory failure with hypoxia (HCC)   Alcoholic cirrhosis of liver with ascites (HCC)   COPD with acute exacerbation (HCC)   Alcoholic peripheral neuropathy (HCC)   Abdominal pain   Alcoholic hepatitis without ascites   Acute metabolic encephalopathy -Believe secondary to acute alcohol intoxication and hyper ammonemia with early hepatic encephalopathy and a component of alcohol withdrawals. -Ammonia level is in normal range. -Confusion is definitely improving; per family at baseline mental status. -Acute alcoholic hepatitis is what everything is pointing towards.  He does not have evidence of cirrhosis on imaging studies or of portal hypertension, LFTs and renal function are all improving. -Maddrey's discriminant factor on admission gives him a score of 8.7 which is a good prognosis, I do not believe he needs steroids for this reason.  -Abdominal ultrasound shows cholelithiasis without gallbladder wall thickening and significant hepatic steatosis. -CT abdomen and pelvis shows hepatomegaly, hepatic  steatosis and cholelithiasis without cholecystitis. -LFTs need to improve. -Discontinue all acetaminophen products.  -Appreciate GI input and recommendations.  Alcohol abuse -Continue thiamine, folate. -Continue to monitor for withdrawals on CIWA protocol, is receiving ativan per protocol. -Spent a significant amount of time with him on 6/26 discussing alcohol cessation and will ask social work to see him  to provide resources.  Acute respiratory failure with hypoxemia -Suspect multifactorial due to COPD with acute exacerbation, OHS/OSA and possibly hypoventilation due to acute alcohol intoxication. -He has now been weaned to room air. -2D echocardiogram shows an ejection fraction of 60 to 65% with no wall motion abnormalities and trivial tricuspid regurgitation. -He has no wheezing on lung auscultation today, have discontinued steroids see above for details.  He does not need steroids for his COPD either.  Acute renal failure -Multifactorial suspect related to significant NSAID use at home and possibly contrast nephropathy.  Hepatorenal syndrome is also not completely ruled out although unlikely. -Nephrology is following, appreciate their input and recommendations.  Plan to continue Lasix dose at 40 mg IV twice daily.  -Renal function is improving: down to 2.18 on 6/29 from 3.46 on 6/28 and 4.12 on 6/27. -Cr is 1.48 on 6/30.  Hyponatremia -Suspect due to volume depletion, sodium is stable at 135 today, continue to follow.  Intractable vomiting -Significantly improved, suspect related to alcoholic gastritis -PRN Zofran and PPI ordered, he is tolerating diet. -Carafate added.  Tobacco abuse -Tobacco cessation was discussed with patient on 6/26.  Hyperglycemia -No history of diabetes, hemoglobin A1c is 5.5. -Suspect due to steroids and should improve now that we have discontinued them (It has).  DVT prophylaxis: SCDs Code Status: Full code Family Communication: Significant  other at bedside updated on plan of care and all questions answered.   Disposition Plan: Home pending improvement in liver and renal function.  Anticipate 24 hours.  Request PT evaluation when available.  Consultants:   Nephrology  GI  Procedures:   2D echo as above  Antimicrobials:  Anti-infectives (From admission, onward)   None       Subjective: In bed, has no complaints other than continued weakness.  Objective: Vitals:   06/07/18 1326 06/07/18 1941 06/07/18 1952 06/08/18 0134  BP: 108/67 111/71  116/72  Pulse: 93 93  96  Resp: 18 20  18   Temp: 98.3 F (36.8 C) 99.4 F (37.4 C)  99.2 F (37.3 C)  TempSrc: Oral Oral  Oral  SpO2: 92% (!) 88% 96% 91%  Weight:      Height:        Intake/Output Summary (Last 24 hours) at 06/08/2018 0803 Last data filed at 06/08/2018 0500 Gross per 24 hour  Intake 640 ml  Output 550 ml  Net 90 ml   Filed Weights   06/01/18 0726 06/01/18 1708  Weight: 117.9 kg (260 lb) 117.9 kg (260 lb)    Examination:  General exam: Alert, awake, oriented x 3 Respiratory system: Clear to auscultation. Respiratory effort normal. Cardiovascular system:RRR. No murmurs, rubs, gallops. Gastrointestinal system: Abdomen is distended, soft and nontender. Normal bowel sounds heard. Central nervous system: Alert and oriented. No focal neurological deficits. Extremities: 1+ pitting edema +pedal pulses Skin: No rashes, lesions or ulcers Psychiatry: Judgement and insight appear normal. Mood & affect appropriate.       Data Reviewed: I have personally reviewed following labs and imaging studies  CBC: Recent Labs  Lab 06/03/18 0501 06/04/18 0618 06/05/18 0503 06/06/18 0555 06/07/18 0639  WBC 11.5* 13.1* 13.0* 12.2* 10.7*  NEUTROABS  --   --   --   --  7.1  HGB 14.2 13.9 14.6 12.7* 12.7*  HCT 42.3 41.9 42.8 37.8* 37.4*  MCV 115.9* 116.1* 115.1* 114.9* 113.3*  PLT 140* 156 168 122* 527*   Basic Metabolic Panel: Recent Labs  Lab  06/01/18 0832  06/03/18 0501 06/04/18 0414 06/05/18 0503 06/06/18 0555 06/07/18 0639  NA  --    < > 134* 133* 133* 134* 135  K  --    < > 4.9 4.9 5.2* 4.6 3.7  CL  --    < > 99 99 98 98 97*  CO2  --    < > 26 24 26 27 30   GLUCOSE  --    < > 153* 146* 109* 85 107*  BUN  --    < > 11 19 27* 36* 34*  CREATININE  --    < > 2.16* 3.63* 4.12* 3.46* 2.18*  CALCIUM  --    < > 8.2* 8.3* 8.5* 8.1* 7.8*  MG 1.5*  --  1.9  --   --   --   --   PHOS  --   --   --  3.6  --  5.1*  --    < > = values in this interval not displayed.   GFR: Estimated Creatinine Clearance: 55.1 mL/min (A) (by C-G formula based on SCr of 2.18 mg/dL (H)). Liver Function Tests: Recent Labs  Lab 06/02/18 0451 06/03/18 0501 06/04/18 0414 06/05/18 0503 06/06/18 0555 06/07/18 0639  AST 118* 88*  --  112* 82* 64*  ALT 40 38  --  53* 40 32  ALKPHOS 145* 155*  --  147* 117 114  BILITOT 2.5* 1.8*  --  2.0* 3.0* 2.8*  PROT 6.0* 6.3*  --  6.2* 5.5* 5.6*  ALBUMIN 2.5* 2.7* 2.5* 2.8* 2.4*  2.4* 2.4*   No results for input(s): LIPASE, AMYLASE in the last 168 hours. Recent Labs  Lab 06/01/18 0831 06/02/18 0451 06/03/18 0501 06/04/18 0414 06/05/18 0503  AMMONIA 60* 49* 33 51* 33   Coagulation Profile: Recent Labs  Lab 06/01/18 0831 06/04/18 1701 06/06/18 1307  INR 1.23 1.14 1.35   Cardiac Enzymes: Recent Labs  Lab 06/04/18 0414  CKTOTAL 42*   BNP (last 3 results) No results for input(s): PROBNP in the last 8760 hours. HbA1C: No results for input(s): HGBA1C in the last 72 hours. CBG: No results for input(s): GLUCAP in the last 168 hours. Lipid Profile: No results for input(s): CHOL, HDL, LDLCALC, TRIG, CHOLHDL, LDLDIRECT in the last 72 hours. Thyroid Function Tests: No results for input(s): TSH, T4TOTAL, FREET4, T3FREE, THYROIDAB in the last 72 hours. Anemia Panel: No results for input(s): VITAMINB12, FOLATE, FERRITIN, TIBC, IRON, RETICCTPCT in the last 72 hours. Urine analysis:    Component  Value Date/Time   COLORURINE YELLOW 06/03/2018 [place] 06/03/2018 1555   LABSPEC 1.017 06/03/2018 1555   PHURINE 5.0 06/03/2018 1555   GLUCOSEU NEGATIVE 06/03/2018 1555   HGBUR NEGATIVE 06/03/2018 1555   BILIRUBINUR NEGATIVE 06/03/2018 1555   KETONESUR NEGATIVE 06/03/2018 1555   PROTEINUR NEGATIVE 06/03/2018 1555   NITRITE NEGATIVE 06/03/2018 1555   LEUKOCYTESUR NEGATIVE 06/03/2018 1555   Sepsis Labs: @LABRCNTIP (procalcitonin:4,lacticidven:4)  )No results found for this or any previous visit (from the past 240 hour(s)).       Radiology Studies: No results found.      Scheduled Meds: . acebutolol  400 mg Oral BID  . allopurinol  100 mg Oral Daily  . budesonide (PULMICORT) nebulizer solution  0.5 mg Nebulization BID  . folic acid  1 mg Oral Daily  . furosemide  40 mg Intravenous BID  . gabapentin  400 mg Oral TID  . ipratropium-albuterol  3 mL Nebulization BID  . multivitamin with minerals  1 tablet Oral Daily  . naltrexone  50 mg Oral QHS  . nicotine  21 mg Transdermal Daily  . omega-3 acid ethyl esters  2 g Oral Daily  . pantoprazole  40 mg Oral Daily  . pneumococcal 23 valent vaccine  0.5 mL Intramuscular Tomorrow-1000  . polyethylene glycol  17 g Oral BID  . sucralfate  1 g Oral TID WC & HS  . thiamine  100 mg Oral Daily   Or  . thiamine  100 mg Intravenous Daily  . vitamin B-12  1,000 mcg Oral Daily   Continuous Infusions:    LOS: 7 days    Time spent: 25 minutes.    Lelon Frohlich, MD [facility] Pager [phone]  If 7PM-7AM, please contact night-coverage [email or web address] Password [email or web address] 06/08/2018, 8:03 AM

## 2018-06-08 NOTE — Progress Notes (Signed)
Pt's manual BP 98/68 HR 80's while lying. When asked if he has felt dizzy of lightheaded, he states "sometimes when I'm up walking to the bathroom."  Dr. Jerilee Hoh paged and made aware. Will continue to monitor.

## 2018-06-09 ENCOUNTER — Encounter: Payer: Managed Care, Other (non HMO) | Admitting: Nurse Practitioner

## 2018-06-09 LAB — RENAL FUNCTION PANEL
ANION GAP: 10 (ref 5–15)
Albumin: 2.2 g/dL — ABNORMAL LOW (ref 3.5–5.0)
BUN: 20 mg/dL (ref 6–20)
CHLORIDE: 93 mmol/L — AB (ref 98–111)
CO2: 34 mmol/L — ABNORMAL HIGH (ref 22–32)
Calcium: 7.6 mg/dL — ABNORMAL LOW (ref 8.9–10.3)
Creatinine, Ser: 1.14 mg/dL (ref 0.61–1.24)
Glucose, Bld: 98 mg/dL (ref 70–99)
PHOSPHORUS: 3.3 mg/dL (ref 2.5–4.6)
POTASSIUM: 3.2 mmol/L — AB (ref 3.5–5.1)
Sodium: 137 mmol/L (ref 135–145)

## 2018-06-09 LAB — HEPATIC FUNCTION PANEL
ALBUMIN: 2.3 g/dL — AB (ref 3.5–5.0)
ALK PHOS: 123 U/L (ref 38–126)
ALT: 35 U/L (ref 0–44)
AST: 75 U/L — ABNORMAL HIGH (ref 15–41)
BILIRUBIN INDIRECT: 2 mg/dL — AB (ref 0.3–0.9)
Bilirubin, Direct: 2.1 mg/dL — ABNORMAL HIGH (ref 0.0–0.2)
TOTAL PROTEIN: 5.8 g/dL — AB (ref 6.5–8.1)
Total Bilirubin: 4.1 mg/dL — ABNORMAL HIGH (ref 0.3–1.2)

## 2018-06-09 MED ORDER — OXYCODONE HCL 5 MG PO TABS: 5.0000 mg | ORAL_TABLET | Freq: Four times a day (QID) | ORAL | 0 refills | Status: DC | PRN

## 2018-06-09 MED ORDER — THIAMINE HCL 100 MG PO TABS: 100.0000 mg | ORAL_TABLET | Freq: Every day | ORAL | Status: DC

## 2018-06-09 MED ORDER — POTASSIUM CHLORIDE CRYS ER 20 MEQ PO TBCR
40.0000 meq | EXTENDED_RELEASE_TABLET | Freq: Once | ORAL | Status: AC
  Administered 2018-06-09: 40 meq via ORAL
  Filled 2018-06-09: qty 2

## 2018-06-09 MED ORDER — ADULT MULTIVITAMIN W/MINERALS CH: 1.0000 | ORAL_TABLET | Freq: Every day | ORAL | Status: DC

## 2018-06-09 MED ORDER — LORAZEPAM 1 MG PO TABS: 1.0000 mg | ORAL_TABLET | Freq: Two times a day (BID) | ORAL | 0 refills | Status: DC | PRN

## 2018-06-09 NOTE — Evaluation (Signed)
Physical Therapy Evaluation Patient Details Name: Bradley Miles MRN: [id number] DOB: 09/03/1966 Today's Date: 06/09/2018   History of Present Illness  Patient 52 y.o. male admitted 06/01/18 with diagnosis hepatic encephalopathy. PMH: HTN, ETOH abuse, tobacco, neuropathy, ARF, COPD, ascites.    Clinical Impression  Patient performed overall well. Reported independently PTA including driving. Patient does exhibit balance deficits and reports peripheral neuropathy and medicines make balance difficult. Therefore, patient would benefit from OPPT to address these deficits after acute care complete. Patient evaluated by Physical Therapy with no further acute PT needs identified. All education has been completed and the patient has no further questions.  Patient would benefit from ambulation activities with nursing while still in the acute care setting. See below for any follow-up Physical Therapy or equipment needs. PT is signing off. Thank you for this referral.     Follow Up Recommendations Outpatient PT    Equipment Recommendations  None recommended by PT    Recommendations for Other Services       Precautions / Restrictions Precautions Precautions: Fall Precaution Comments: on stairs Restrictions Weight Bearing Restrictions: No      Mobility  Bed Mobility Overal bed mobility: Independent                Transfers Overall transfer level: Modified independent               General transfer comment: additional time to steady  Ambulation/Gait Ambulation/Gait assistance: Supervision Gait Distance (Feet): 450 Feet Assistive device: None Gait Pattern/deviations: Step-through pattern Gait velocity: decr   General Gait Details: somewhat unsteady on feet with several mimor LOBs being able to recover balance independently; somewhat veering path  Stairs Stairs: Yes Stairs assistance: Min guard Stair Management: One rail Right;Alternating pattern;Forwards Number of  Stairs: 12 General stair comments: patient required support of right hand on handrail to maintain balance with mild difficulty maintaining balance going up stairs; balance improved going down stairs with min guard  Wheelchair Mobility    Modified Rankin (Stroke Patients Only)       Balance Overall balance assessment: Needs assistance Sitting-balance support: No upper extremity supported;Feet supported Sitting balance-Leahy Scale: Good     Standing balance support: No upper extremity supported;During functional activity Standing balance-Leahy Scale: Fair                               Pertinent Vitals/Pain Pain Assessment: 0-10 Pain Score: 3     Home Living Family/patient expects to be discharged to:: Private residence Living Arrangements: Spouse/significant other Available Help at Discharge: Family;Friend(s) Type of Home: House Home Access: Stairs to enter   [employer] of Steps: 2 Home Layout: Two level;Laundry or work area in [employer]: None      Prior Function Level of Independence: Independent               Journalist, newspaper        Extremity/Trunk Assessment   Upper Extremity Assessment Upper Extremity Assessment: Overall WFL for tasks assessed    Lower Extremity Assessment Lower Extremity Assessment: Overall WFL for tasks assessed(patient reports peripheral neuropathy in both feet.)    Cervical / Trunk Assessment Cervical / Trunk Assessment: Normal  Communication   Communication: No difficulties  Cognition Arousal/Alertness: Awake/alert Behavior During Therapy: WFL for tasks assessed/performed Overall Cognitive Status: Within Functional Limits for tasks assessed  General Comments      Exercises     Assessment/Plan    PT Assessment All further PT needs can be met in the next venue of care  PT Problem List Impaired sensation;Decreased balance        PT Treatment Interventions      PT Goals (Current goals can be found in the Care Plan section)  Acute Rehab PT Goals Patient Stated Goal: 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 PT Goal Formulation: With patient/family Time For Goal Achievement: 06/23/18 Potential to Achieve Goals: Fair    Frequency     Barriers to discharge        Co-evaluation               AM-PAC PT "6 Clicks" Daily Activity  Outcome Measure Difficulty turning over in bed (including adjusting bedclothes, sheets and blankets)?: None Difficulty moving from lying on back to sitting on the side of the bed? : None Difficulty sitting down on and standing up from a chair with arms (e.g., wheelchair, bedside commode, etc,.)?: A Little Help needed moving to and from a bed to chair (including a wheelchair)?: A Little Help needed walking in hospital room?: A Little Help needed climbing 3-5 steps with a railing? : A Little 6 Click Score: 20    End of Session Equipment Utilized During Treatment: Gait belt Activity Tolerance: Patient tolerated treatment well Patient left: in bed;with family/visitor present Nurse Communication: Mobility status PT Visit Diagnosis: Unsteadiness on feet (R26.81);Difficulty in walking, not elsewhere classified (R26.2)    Time: 0981-1914 PT Time Calculation (min) (ACUTE ONLY): 28 min   Charges:   PT Evaluation $PT Eval Low Complexity: 1 Low PT Treatments $Gait Training: 8-22 mins   PT G Codes:        Vedant Shehadeh D. Hartnett-Rands, MS, PT [facility] [place] #[id number] 06/09/2018, 12:50 PM

## 2018-06-09 NOTE — Clinical Social Work Note (Signed)
Patient referred to [facility]. He is to go to the walk in clinic on 06/11/18 between 8 and 2:30. With patient's verbal permission, the discharge summary was sent as part of the referral.   LCSW signing off.    Vir Whetstine, Clydene Pugh, LCSW

## 2018-06-09 NOTE — Discharge Summary (Signed)
Physician Discharge Summary  Bradley Miles WUJ:[id number] DOB: 02/21/1966 DOA: 06/01/2018  PCP: Bradley Kirschner, MD  Admit date: 06/01/2018 Discharge date: 06/09/2018  Time spent: 45 minutes  Recommendations for Outpatient Follow-up:  -Will be discharged home today. -Social work will provide with outpatient resources for alcohol cessation. -Advised to follow-up with PCP in 2 weeks. -GI will call and arrange outpatient follow-up.  Discharge Diagnoses:  Active Problems:   TOBACCO ABUSE   Hypertension   EtOH dependence (HCC)   Sensory neuropathy   Prediabetes   Hepatic encephalopathy (HCC)   Acute respiratory failure with hypoxia (HCC)   Alcoholic cirrhosis of liver with ascites (HCC)   COPD with acute exacerbation (HCC)   Alcoholic peripheral neuropathy (HCC)   Abdominal pain   Alcoholic hepatitis without ascites   Discharge Condition: Stable and improved  Filed Weights   06/01/18 0726 06/01/18 1708  Weight: 117.9 kg (260 lb) 117.9 kg (260 lb)    History of present illness:  As per Dr. Carles Miles on 6/23: Bradley Miles is a 52 y.o. male with medical history of alcohol abuse, presumptive COPD, peripheral neuropathy, hypertension presenting with approximately 3-week history of nausea, vomiting, abdominal pain.  The patient has a history of alcohol dependence and has had relapse in his alcohol use despite previous rehabilitation stays.  The patient has been drinking 2/3 gallon of whiskey daily with his last drink in am 6/23 prior to arrival to ED. nevertheless, the patient has just returned from vacation from [place] despite his abdominal pain, nausea, and vomiting.  The patient denies any fevers, chills, headache, neck pain.  He has loose stools and in the past 2 to 3 days has noticed some hematochezia.  He denies any melena or hematemesis.  He has been having increasing abdominal girth, lower extremity edema, and dyspnea on exertion.  He has had some orthopnea type symptoms  prompting him to sleep in a recliner intermittently.  His primary care provider ordered a CT of the abdomen and pelvis approximately 1 week prior to this admission.  The report is not available, but the family states that it showed fatty liver without obstruction and a negative gallbladder.  In addition, his significant other has noted that the patient has been more somnolent in the past 2 to 3 days.  In the emergency department, the patient was afebrile and hemodynamically stable albeit with soft blood pressure 107/73.  Oxygen saturation was fluctuating with desaturations while the patient was asleep.  BMP was 126 with potassium 3.3.  AST 125, ALT 41, alk phosphatase 151, total bilirubin 3.1, ammonia 60, lipase 58.  CBC showed normal cytopenia with hemoglobin 13.5.  INR was 1.23.  Urinalysis and urine drug screen was negative.  Alcohol level was 255.  Chest x-ray was negative for any acute findings.  The patient was given Ativan, Pepcid, lactulose, and Zofran and bolused 1 L.     Hospital Course:   Acute metabolic encephalopathy -Believe secondary to acute alcohol intoxication and hyper ammonemia with early hepatic encephalopathy and a component of alcohol withdrawals. -Ammonia level is in normal range. -Confusion is resolved. -Acute alcoholic hepatitis is what everything is pointing towards.  He does not have evidence of cirrhosis on imaging studies or of portal hypertension, LFTs and renal function are all improving. -Maddrey's discriminant factor on admission gives him a score of 8.7 which is a good prognosis, and steroids were not given.  -Abdominal ultrasound shows cholelithiasis without gallbladder wall thickening and significant hepatic  steatosis. -CT abdomen and pelvis shows hepatomegaly, hepatic steatosis and cholelithiasis without cholecystitis. -LFTs continue to improve. -Discontinue all acetaminophen products.  -Appreciate GI input and recommendations.  Alcohol abuse -Continue  thiamine, folate. -Has been thru withdrawals this admission. -Will give #15 tablets of ativan on DC to help manage withdrawal symptoms.  Acute respiratory failure with hypoxemia -Suspect multifactorial due to COPD with acute exacerbation, OHS/OSA and possibly hypoventilation due to acute alcohol intoxication. -He has now been weaned to room air. -2D echocardiogram shows an ejection fraction of 60 to 65% with no wall motion abnormalities and trivial tricuspid regurgitation. -He has no wheezing on lung auscultation today, have discontinued steroids see above for details.  He does not need steroids for his COPD either.  Acute renal failure -Multifactorial suspect related to significant NSAID use at home and possibly contrast nephropathy.  Hepatorenal syndrome is also not completely ruled out although unlikely. -Nephrology is following, appreciate their input and recommendations.  Plan to continue Lasix dose at 40 mg IV twice daily.  -Renal function is improving: down to 2.18 on 6/29 from 3.46 on 6/28 and 4.12 on 6/27. -Cr is 1.48 on 6/30, 1.14 on DC.  Hyponatremia -Suspect due to volume depletion, sodium is stable at 135 on DC.  Intractable vomiting -Resolved, suspect related to alcoholic gastritis -PRN Zofran and PPI ordered, he is tolerating diet. -Carafate added.  Tobacco abuse -Tobacco cessation was discussed with patient on 6/26. -He does not believe he will be successful with both ETOH and smoking cessation, so would like to hold off on smoking for now.  Hyperglycemia -No history of diabetes, hemoglobin A1c is 5.5. -Suspect due to steroids. CBGs have returned to normal with cessation of steroids.      Procedures:  None   Consultations:  GI  Nephrology  Discharge Instructions   Allergies as of 06/09/2018   No Known Allergies     Medication List    STOP taking these medications   enalapril 10 MG tablet Commonly known as:  VASOTEC   naltrexone 50 MG  tablet Commonly known as:  DEPADE   ondansetron 4 MG disintegrating tablet Commonly known as:  ZOFRAN ODT   valACYclovir 1000 MG tablet Commonly known as:  VALTREX   VIAGRA 100 MG tablet Generic drug:  sildenafil     TAKE these medications   acebutolol 400 MG capsule Commonly known as:  SECTRAL TAKE 1 CAPSULE BY MOUTH TWICE A DAY   albuterol 108 (90 Base) MCG/ACT inhaler Commonly known as:  PROVENTIL HFA;VENTOLIN HFA Inhale 2 puffs into the lungs every 6 (six) hours as needed for wheezing or shortness of breath.   allopurinol 100 MG tablet Commonly known as:  ZYLOPRIM TAKE 1 TABLET BY MOUTH ONCE DAILY   colchicine 0.6 MG tablet take 1 tablet by mouth once daily What changed:    how much to take  how to take this  when to take this   dexlansoprazole 60 MG capsule Commonly known as:  DEXILANT Take 1 capsule (60 mg total) by mouth daily.   FISH OIL PO Take 3 capsules by mouth daily.   folic acid 1 MG tablet Commonly known as:  FOLVITE Take 1 mg by mouth daily.   gabapentin 300 MG capsule Commonly known as:  NEURONTIN TAKE ONE CAPSULE BY MOUTH FOUR TIMES DAILY   LORazepam 1 MG tablet Commonly known as:  ATIVAN Take 1 tablet (1 mg total) by mouth 2 (two) times daily as needed for anxiety.   multivitamin  with minerals Tabs tablet Take 1 tablet by mouth daily. Start taking on:  06/10/2018   oxyCODONE 5 MG immediate release tablet Commonly known as:  Oxy IR/ROXICODONE Take 1 tablet (5 mg total) by mouth every 6 (six) hours as needed for severe pain.   sucralfate 1 g tablet Commonly known as:  CARAFATE One po Ac   thiamine 100 MG tablet Take 1 tablet (100 mg total) by mouth daily. Start taking on:  06/10/2018   vitamin B-12 1000 MCG tablet Commonly known as:  CYANOCOBALAMIN Take 1,000 mcg by mouth daily.      No Known Allergies Follow-up Information    Bradley Kirschner, MD. Schedule an appointment as soon as possible for a visit in 2 week(s).     Specialty:  Family Medicine Contact information: [address] [place] [address] [phone]            The results of significant diagnostics from this hospitalization (including imaging, microbiology, ancillary and laboratory) are listed below for reference.    Significant Diagnostic Studies: Dg Chest 2 View  Result Date: 06/01/2018 CLINICAL DATA:  Cough with dyspnea and right upper quadrant pain. EXAM: CHEST - 2 VIEW COMPARISON:  01/06/2018 FINDINGS: Lungs are adequately inflated without focal consolidation or effusion. Cardiomediastinal silhouette and remainder the exam is unchanged. IMPRESSION: No active cardiopulmonary disease. Electronically Signed   By: Marin Olp M.D.   On: 06/01/2018 09:21   US Abdomen Complete  Result Date: 06/02/2018 CLINICAL DATA:  Abdominal pain with nausea and vomiting for 3 weeks EXAM: ABDOMEN ULTRASOUND COMPLETE COMPARISON:  None. FINDINGS: Gallbladder: Mobile gallstones and sludge. No focal tenderness or wall thickening. Common bile duct: Diameter: 4 mm Liver: Echogenic from steatosis which decreases visualization of mid and deep parenchyma. Portal vein is patent on color Doppler imaging with normal direction of blood flow towards the liver (assuming standard Doppler scale on the prior images). IVC: No abnormality visualized. Pancreas: Visualized portion unremarkable. Spleen: Size and appearance within normal limits. Right Kidney: Length: 13 cm. Echogenicity within normal limits. No mass or hydronephrosis visualized. Left Kidney: Length: 13 cm. Echogenicity within normal limits. No mass or hydronephrosis visualized. Abdominal aorta: No aneurysm visualized. IMPRESSION: 1. Marked hepatic steatosis to a degree that much of the liver is poorly visualized. 2. Cholelithiasis without findings of acute cholecystitis. Electronically Signed   By: Monte Fantasia M.D.   On: 06/02/2018 11:10   Ct Abdomen Pelvis W Contrast  Result Date:  06/02/2018 CLINICAL DATA:  Inpatient. Right upper quadrant abdominal pain with nausea and vomiting for 2 weeks. EXAM: CT ABDOMEN AND PELVIS WITH CONTRAST TECHNIQUE: Multidetector CT imaging of the abdomen and pelvis was performed using the standard protocol following bolus administration of intravenous contrast. CONTRAST:  140m ISOVUE-300 IOPAMIDOL (ISOVUE-300) INJECTION 61% COMPARISON:  06/02/2018 abdominal sonogram. FINDINGS: Lower chest: No significant pulmonary nodules or acute consolidative airspace disease. Hepatobiliary: Hepatomegaly. Diffuse hepatic steatosis. No definite liver surface irregularity. No liver masses. Cholelithiasis. Nondistended gallbladder. No gallbladder wall thickening. No pericholecystic fluid. No biliary ductal dilatation. Pancreas: Normal, with no mass or duct dilation. Spleen: Normal size. No mass. Adrenals/Urinary Tract: Normal adrenals. Delayed contrast nephrograms bilaterally. No hydronephrosis. No renal masses. Normal nondistended bladder. Stomach/Bowel: Normal non-distended stomach. Normal caliber small bowel with no small bowel wall thickening. Normal appendix. Scattered mild colonic diverticulosis, with no large bowel wall thickening or significant pericolonic fat stranding. Vascular/Lymphatic: Atherosclerotic nonaneurysmal abdominal aorta. Patent portal, splenic, hepatic and renal veins. No pathologically enlarged lymph nodes  in the abdomen or pelvis. Reproductive: Normal size prostate. Other: No pneumoperitoneum, ascites or focal fluid collection. Musculoskeletal: No aggressive appearing focal osseous lesions. Moderate thoracolumbar spondylosis. Bilateral L5 pars defects. IMPRESSION: 1. Hepatomegaly. Diffuse hepatic steatosis. No definite morphologic changes of cirrhosis. 2. Cholelithiasis. No CT findings of acute cholecystitis. No biliary ductal dilatation. 3. No evidence of bowel obstruction or acute bowel inflammation. Mild colonic diverticulosis, with no evidence of acute  diverticulitis. 4. Delayed contrast nephrograms bilaterally, suggesting nonspecific acute renal failure. No hydronephrosis. 5. Bilateral L5 pars defects. 6.  Aortic Atherosclerosis (ICD10-I70.0). Electronically Signed   By: Ilona Sorrel M.D.   On: 06/02/2018 20:49   US Venous Img Lower Bilateral  Result Date: 06/02/2018 CLINICAL DATA:  Bilateral pain and edema. Varicose veins. Previous tobacco abuse. EXAM: BILATERAL LOWER EXTREMITY VENOUS DOPPLER ULTRASOUND TECHNIQUE: Gray-scale sonography with compression, as well as color and duplex ultrasound, were performed to evaluate the deep venous system from the level of the common femoral vein through the popliteal and proximal calf veins. COMPARISON:  05/29/2016 FINDINGS: Normal compressibility of the common femoral, superficial femoral, and popliteal veins, as well as the proximal calf veins. No filling defects to suggest DVT on grayscale or color Doppler imaging. Doppler waveforms show normal direction of venous flow, normal respiratory phasicity and response to augmentation. Visualized segments of the saphenous venous systems normal in caliber and compressibility. IMPRESSION: No evidence of  lower extremity deep vein thrombosis, bilaterally. Electronically Signed   By: Lucrezia Europe M.D.   On: 06/02/2018 11:14    Microbiology: No results found for this or any previous visit (from the past 240 hour(s)).   Labs: Basic Metabolic Panel: Recent Labs  Lab 06/03/18 0501 06/04/18 0414 06/05/18 0503 06/06/18 0555 06/07/18 0639 06/08/18 0637 06/09/18 0617  NA 134* 133* 133* 134* 135 135 137  K 4.9 4.9 5.2* 4.6 3.7 3.6 3.2*  CL 99 99 98 98 97* 94* 93*  CO2 26 24 26 27 30 31  34*  GLUCOSE 153* 146* 109* 85 107* 100* 98  BUN 11 19 27* 36* 34* 26* 20  CREATININE 2.16* 3.63* 4.12* 3.46* 2.18* 1.48* 1.14  CALCIUM 8.2* 8.3* 8.5* 8.1* 7.8* 7.9* 7.6*  MG 1.9  --   --   --   --   --   --   PHOS  --  3.6  --  5.1*  --  3.0 3.3   Liver Function Tests: Recent  Labs  Lab 06/05/18 0503 06/06/18 0555 06/07/18 0639 06/08/18 0637 06/09/18 0617  AST 112* 82* 64* 59* 75*  ALT 53* 40 32 30 35  ALKPHOS 147* 117 114 119 123  BILITOT 2.0* 3.0* 2.8* 3.5* 4.1*  PROT 6.2* 5.5* 5.6* 6.1* 5.8*  ALBUMIN 2.8* 2.4*  2.4* 2.4* 2.4*  2.4* 2.3*  2.2*   No results for input(s): LIPASE, AMYLASE in the last 168 hours. Recent Labs  Lab 06/03/18 0501 06/04/18 0414 06/05/18 0503  AMMONIA 33 51* 33   CBC: Recent Labs  Lab 06/04/18 0618 06/05/18 0503 06/06/18 0555 06/07/18 0639 06/08/18 0637  WBC 13.1* 13.0* 12.2* 10.7* 8.2  NEUTROABS  --   --   --  7.1  --   HGB 13.9 14.6 12.7* 12.7* 12.9*  HCT 41.9 42.8 37.8* 37.4* 37.4*  MCV 116.1* 115.1* 114.9* 113.3* 113.0*  PLT 156 168 122* 107* 139*   Cardiac Enzymes: Recent Labs  Lab 06/04/18 0414  CKTOTAL 42*   BNP: BNP (last 3 results) Recent Labs  06/01/18 0831  BNP 63.0    ProBNP (last 3 results) No results for input(s): PROBNP in the last 8760 hours.  CBG: No results for input(s): GLUCAP in the last 168 hours.     Signed:  Lelon Frohlich  [facility] Pager: [phone] 06/09/2018, 12:10 PM

## 2018-06-09 NOTE — Progress Notes (Signed)
Patient discharged home with personal belongings. IV removed and site intact. Patient discharged with all scripts.

## 2018-06-09 NOTE — Progress Notes (Signed)
Subjective: Interval History: Patient is feeling much better.  His weakness also has improved.  Objective: Vital signs in last 24 hours: Temp:  [98.6 F (37 C)-99.3 F (37.4 C)] 98.6 F (37 C) (07/01 0630) Pulse Rate:  [80-87] 82 (07/01 0630) Resp:  [16-18] 18 (07/01 0630) BP: (98-118)/(59-77) 112/77 (07/01 0630) SpO2:  [90 %-95 %] 95 % (07/01 0754) Weight change:   Intake/Output from previous day: 06/30 0701 - 07/01 0700 In: 1065 [P.O.:1065] Out: 1420 [Urine:1420] Intake/Output this shift: No intake/output data recorded.  Generally: Patient is alert and in no apparent distress Chest is clear to auscultation Heart exam revealed regular rate and rhythm no murmur Abdomen: Distended, positive bowel sound and mild tenderness Extremities no edema  Lab Results: Recent Labs    06/07/18 0639 06/08/18 0637  WBC 10.7* 8.2  HGB 12.7* 12.9*  HCT 37.4* 37.4*  PLT 107* 139*   BMET:  Recent Labs    06/08/18 0637 06/09/18 0617  NA 135 137  K 3.6 3.2*  CL 94* 93*  CO2 31 34*  GLUCOSE 100* 98  BUN 26* 20  CREATININE 1.48* 1.14  CALCIUM 7.9* 7.6*   No results for input(s): PTH in the last 72 hours. Iron Studies: No results for input(s): IRON, TIBC, TRANSFERRIN, FERRITIN in the last 72 hours.  Studies/Results: No results found.  I have reviewed the patient's current medications.  Assessment/Plan: 1] acute kidney injury: His renal function continue to improve.  His creatinine is 1.14. 2] hyponatremia: His sodium is normal. 3] abdominal distention: His abdominal pain has improved. 4] enlarged liver/liver cirrhosis 5] hypertension: His blood pressure is reasonably controlled 6] history of alcohol abuse 7] history of COPD: He is on inhaler and his breathing is much better. 8] hypokalemia: This is due to diuretics. Plan: 1] agree with potassium supplement 2] we will DC Lasix 3] since his renal function has recovered I will sign off.  Thank you for letting me participate  in his care.   LOS: 8 days   Emillee Talsma S 06/09/2018,8:52 AM

## 2018-06-09 NOTE — Care Management Note (Signed)
Case Management Note  Patient Details  Name: KAYA KLAUSING MRN: [id number] Date of Birth: [birth year]  Subjective/Objective:                    Action/Plan:  Patient discharging home today. Recommended for OP PT. Patient agreeable. Referral sent to [facility] OP PT.   Expected Discharge Date:    06/09/2018             Expected Discharge Plan:  Home/Self Care(OP PT)  In-House Referral:  Clinical Social Work  Discharge planning Services  CM Consult  Post Acute Care Choice:    Choice offered to:  Patient  DME Arranged:    DME Agency:     HH Arranged:    [place] Agency:     Status of Service:  Completed, signed off  If discussed at [employer], dates discussed:    Additional Comments:  Lilou Kneip, Chauncey [place], RN 06/09/2018, 1:21 PM

## 2018-06-09 NOTE — Progress Notes (Signed)
Subjective: Pain 3 out of 10 RUQ, epigastric. Improved. Sitting up on side of bed. Feels better than he did this weekend. No N/V. BM this morning, medium-sized. Feels overall improved. Girlfriend on phone as well listening to discussion this morning.   Objective: Vital signs in last 24 hours: Temp:  [98.6 F (37 C)-99.3 F (37.4 C)] 98.6 F (37 C) (07/01 0630) Pulse Rate:  [80-87] 82 (07/01 0630) Resp:  [16-18] 18 (07/01 0630) BP: (98-118)/(59-77) 112/77 (07/01 0630) SpO2:  [90 %-95 %] 95 % (07/01 0754) Last BM Date: 06/07/18 General:   Alert and oriented, sitting on side of bed, appears much more alert than past 24-48 hours, marked improvement Abdomen:  Bowel sounds present, soft, mild TTP RUQ/epigastric, no rebound or guarding Extremities:  With trace edema  Neurologic:  Alert and  oriented x4   Intake/Output from previous day: 06/30 0701 - 07/01 0700 In: 1065 [P.O.:1065] Out: 1420 [Urine:1420] Intake/Output this shift: No intake/output data recorded.  Lab Results: Recent Labs    06/07/18 0639 06/08/18 0637  WBC 10.7* 8.2  HGB 12.7* 12.9*  HCT 37.4* 37.4*  PLT 107* 139*   BMET Recent Labs    06/07/18 0639 06/08/18 0637 06/09/18 0617  NA 135 135 137  K 3.7 3.6 3.2*  CL 97* 94* 93*  CO2 30 31 34*  GLUCOSE 107* 100* 98  BUN 34* 26* 20  CREATININE 2.18* 1.48* 1.14  CALCIUM 7.8* 7.9* 7.6*   LFT Recent Labs    06/07/18 0639 06/08/18 0637 06/09/18 0617  PROT 5.6* 6.1*  --   ALBUMIN 2.4* 2.4*  2.4* 2.2*  AST 64* 59*  --   ALT 32 30  --   ALKPHOS 114 119  --   BILITOT 2.8* 3.5*  --   BILIDIR  --  1.9*  --   IBILI  --  1.6*  --    PT/INR Recent Labs    06/06/18 1307 06/08/18 0637  LABPROT 16.6* 15.6*  INR 1.35 1.25   Hepatitis Panel Recent Labs    06/06/18 1307  HEPAIGM Negative  HEPBIGM Negative    Assessment: 52 year old male with history of ETOH abuse, presenting with N/V/abdominal pain, and confusion.   Encephalopathy:  multifactorial in setting of alcohol withdrawal,which is largest contributor. Resolved. Appears markedly improved this morning from over the weekend.   Elevated LFTs: in setting of ETOH abuse. No prednisolone needed due tolowdiscriminant function. Mild alcoholic hepatitis. As of note, no findings of splenomegaly or concern for advanced chronic liver disease; he does have diffuse hepatic steatosis however. Hep B core antibody and Hep A antibody IgM negative. Hep C antibody negative. Hep B surface antigen remains pending and should be resulted today per [facility] lab. Bilirubin has been fluctuating but transaminases improved over course of hospitalization. HFP remains pending this morning.   AKI: appreciate nephrology involvement. Creatinine normalized.   Abdominal bloating: improved after discontinuing lactulose and starting Miralax. BM today. No ascites appreciated.  Although HFP remains in process this morning, he is stable from a GI standpoint and anticipate discharge today barring any significant changes. We will follow HFP as outpatient.   Plan: ETOH cessation Will repeat HFP, INR later this week after discharge Hep B surface antigen still pending but hopefully back today Appreciate Nephrology consultation and recommendations Will arrange close outpatient follow-up Stable from a GI standpoint for discharge   Annitta Needs, PhD, ANP-BC [facility]    LOS: 8 days    06/09/2018, 8:21 AM

## 2018-06-10 ENCOUNTER — Telehealth: Payer: Self-pay | Admitting: Gastroenterology

## 2018-06-10 LAB — HEPATITIS B SURFACE ANTIGEN: HEP B S AG: NEGATIVE

## 2018-06-10 NOTE — Telephone Encounter (Signed)
Tried calling pt, VM hasn't been set up. Will call again.

## 2018-06-10 NOTE — Telephone Encounter (Signed)
Please have patient complete HFP and INR on Friday or by latest Monday of next week. Recently inpatient with elevated LFTs secondary to ETOH.

## 2018-06-13 ENCOUNTER — Ambulatory Visit: Payer: Managed Care, Other (non HMO) | Admitting: Family Medicine

## 2018-06-13 ENCOUNTER — Other Ambulatory Visit: Payer: Self-pay

## 2018-06-13 ENCOUNTER — Encounter: Payer: Self-pay | Admitting: Family Medicine

## 2018-06-13 VITALS — BP 120/74 | Temp 98.8°F | Ht 73.0 in | Wt 252.0 lb

## 2018-06-13 DIAGNOSIS — R945 Abnormal results of liver function studies: Principal | ICD-10-CM

## 2018-06-13 DIAGNOSIS — R748 Abnormal levels of other serum enzymes: Secondary | ICD-10-CM

## 2018-06-13 DIAGNOSIS — F10282 Alcohol dependence with alcohol-induced sleep disorder: Secondary | ICD-10-CM | POA: Diagnosis not present

## 2018-06-13 DIAGNOSIS — R7989 Other specified abnormal findings of blood chemistry: Secondary | ICD-10-CM

## 2018-06-13 DIAGNOSIS — R160 Hepatomegaly, not elsewhere classified: Secondary | ICD-10-CM | POA: Diagnosis not present

## 2018-06-13 MED ORDER — THIAMINE HCL 500 MG PO TABS: 1000.0000 mg | ORAL_TABLET | Freq: Every day | ORAL | 11 refills | Status: DC

## 2018-06-13 MED ORDER — LORAZEPAM 1 MG PO TABS: 1.0000 mg | ORAL_TABLET | Freq: Two times a day (BID) | ORAL | 2 refills | Status: DC | PRN

## 2018-06-13 NOTE — Telephone Encounter (Signed)
Pt is on waitlist

## 2018-06-13 NOTE — Progress Notes (Signed)
   Subjective:    Patient ID: Bradley Miles, male    DOB: 06-10-1966, 52 y.o.   MRN: [id number]  HPIFollow up hospitalization for abdominal pain. Pt states he is not much better.   ruq pain still signifant ,  Pt claims have quit alcohol  Working on counselling  Due to see gI proximally 6 weeks   Complete hospital record reviewed today in presence of patient.  All consult notes all test blood work etc. reviewed  Patient reports no alcohol intake since discharge from the hospital.  Continues to have abdominal pain upper right quadrant primarily.  Intermittent in nature.  Continues to suffer from substantial anxiety and nervousness.  Not exceeding #2 advance per day  Review of Systems No headache, no major weight loss or weight gain, no chest pain no back pai no change in bowel habits complete ROS otherwi    Objective:   Physical Exam   Alert and oriented, vitals reviewed and stable, NAD ENT-TM's and ext canals WNL bilat via otoscopic exam Soft palate, tonsils and post pharynx WNL via oropharyngeal exam Neck-symmetric, no masses; thyroid nonpalpable and nontender Pulmonary-no tachypnea or accessory muscle use; Clear without wheezes via auscultation Card--no abnrml murmurs, rhythm reg and rate WNL Carotid pulses symmetric, without bruits Upper right quadrant liver palpable moderately tender to palpation     Assessment & Plan:  1 acute alcoholic hepatitis.  Clinically improved.  Still an issue with discomfort and enlarged liver.  Fortunately scanning did not show substantial element of cirrhosis, advised patient only a biopsy which showed this percent for sure.  Chronic anxiety ongoing and substantial and associated with patient's alcohol intake.  I do think it would be reasonable to 1 Ativan twice daily not to exceed  Alcohol abuse.  Patient admits to pursuing outpatient therapy at [facility]  Acute renal failure nearly resolved upon discharge  Work excuse next 2  weeks.  Physical therapy has also been arranged through the hospital.

## 2018-06-13 NOTE — Telephone Encounter (Signed)
Spoke with pt this morning and he is going to go to [place] and have his lab work done. Orders placed and released.   Erline Levine or Manuela Schwartz, pt would like to be placed on a cancellation list since he was recently discharged from the hospital.

## 2018-06-14 LAB — PROTIME-INR
INR: 1.1 (ref 0.8–1.2)
PROTHROMBIN TIME: 11.2 s (ref 9.1–12.0)

## 2018-06-14 LAB — HEPATIC FUNCTION PANEL
ALK PHOS: 154 IU/L — AB (ref 39–117)
ALT: 53 IU/L — AB (ref 0–44)
AST: 99 IU/L — AB (ref 0–40)
Albumin: 3.2 g/dL — ABNORMAL LOW (ref 3.5–5.5)
Bilirubin Total: 2.3 mg/dL — ABNORMAL HIGH (ref 0.0–1.2)
Bilirubin, Direct: 1.27 mg/dL — ABNORMAL HIGH (ref 0.00–0.40)
TOTAL PROTEIN: 6.6 g/dL (ref 6.0–8.5)

## 2018-06-16 ENCOUNTER — Ambulatory Visit (HOSPITAL_COMMUNITY): Payer: Managed Care, Other (non HMO) | Attending: Internal Medicine

## 2018-06-16 ENCOUNTER — Telehealth: Payer: Self-pay | Admitting: Gastroenterology

## 2018-06-16 ENCOUNTER — Encounter (HOSPITAL_COMMUNITY): Payer: Self-pay

## 2018-06-16 DIAGNOSIS — M79671 Pain in right foot: Secondary | ICD-10-CM | POA: Diagnosis present

## 2018-06-16 DIAGNOSIS — R262 Difficulty in walking, not elsewhere classified: Secondary | ICD-10-CM | POA: Insufficient documentation

## 2018-06-16 DIAGNOSIS — M6281 Muscle weakness (generalized): Secondary | ICD-10-CM | POA: Diagnosis present

## 2018-06-16 DIAGNOSIS — M79672 Pain in left foot: Secondary | ICD-10-CM | POA: Diagnosis present

## 2018-06-16 NOTE — Telephone Encounter (Signed)
[phone]  PLEASE CALL PATIENT WIFE ABOUT HIM.  SHE SAID THAT SOMEONE ASKED HIM ABOUT HIS DRINKING AND SHE SAID HE HAS NOT HAD ANYTHING TO DRINK, HAS QUESTION ABOUT HIS LABS

## 2018-06-16 NOTE — Therapy (Signed)
[facility] [address] [place], [place], [address] Phone: [phone]   Fax:  [phone]  Physical Therapy Evaluation  Patient Details  Name: Bradley Miles MRN: [id number] Date of Birth: 05/19/1966 Referring Provider: Acosta Hernandez, Estela   Encounter Date: 06/16/2018  PT End of Session - 06/16/18 1539    Visit Number  1    Number of Visits  8    Date for PT Re-Evaluation  07/14/18 mini-reassess 06/30/18    Authorization Type  [employer] Managed - deduct $1000 - $942.79 met; OOP - $2000; 20 visit hard max/yr; once meets deduct, won't have to pay co-pay    Authorization - Visit Number  1    Authorization - Number of Visits  10    PT Start Time  1206    PT Stop Time  1252    PT Time Calculation (min)  46 min    Activity Tolerance  Patient tolerated treatment well    Behavior During Therapy  WFL for tasks assessed/performed       Past Medical History:  Diagnosis Date  . Anxiety   . Enlarged liver   . GERD (gastroesophageal reflux disease)   . Hypertension   . Palpitations   . PVC's (premature ventricular contractions)   . Shortness of breath   . Varicose veins     Past Surgical History:  Procedure Laterality Date  . COLONOSCOPY WITH ESOPHAGOGASTRODUODENOSCOPY (EGD)  12/16/2012   Procedure: COLONOSCOPY WITH ESOPHAGOGASTRODUODENOSCOPY (EGD);  Surgeon: Robert M Rourk, MD;  Location: [facility];  Service: Endoscopy;  Laterality: N/A;  7:30  . cyst reomved     from spine  . ESOPHAGOGASTRODUODENOSCOPY  05/19/09   RMR: Geographic distal esophageal erosions consistent with severe erosive reflux esophagitis. schatzki's ring s/P dilation/small hiatal hernia otherwise normal stomach  . WISDOM TOOTH EXTRACTION      There were no vitals filed for this visit.   Subjective Assessment - 06/16/18 1222    Subjective  Patient c/o pain in both legs when walking longer distances that's probably the claudication. But also c/o numbness in  bilateral feet and ankles, pins and needles, loosing light pressure feeling in big toes. Gets shock waes in legs and leg will jump.     Pertinent History  HTN, ETOH abuse, tobacco for 35 years    Limitations  Walking;Standing    How long can you stand comfortably?  20 minutes    How long can you walk comfortably?  3-4 blocks until start feeling lower leg pain and weakness, legs feel heavy    Patient Stated Goals  strength and conditioning    Currently in Pain?  No/denies         [facility] PT Assessment - 06/16/18 0001      Assessment   Medical Diagnosis  bialteral leg neuropathy; intermittent claudication    Referring Provider  Acosta Hernandez, Estela    Onset Date/Surgical Date  06/09/18    Hand Dominance  Right    Next MD Visit  0719/19 Dr Luking    Prior Therapy  No      Restrictions   Weight Bearing Restrictions  No      Balance Screen   Has the patient fallen in the past 6 months  No    Has the patient had a decrease in activity level because of a fear of falling?   No    Is the patient reluctant to leave their home because of a fear of   falling?   No      Home Environment   Living Environment  Private residence      Prior Function   Level of Independence  Independent    Vocation  Full time employment    Vocation Requirements  lifting 50# bags overhead and taking a few steps    Leisure  woodworking      Cognition   Overall Cognitive Status  Within Functional Limits for tasks assessed      Observation/Other Assessments   Focus on Therapeutic Outcomes (FOTO)   43% limited      Sensation   Light Touch Impaired Details  Impaired LLE;Impaired RLE    Additional Comments  LLE light touch impaired to mid shaft of tibia on all sides; RLE light touch impaired to lower 1/3 of shaft of tibia      Strength   Right Hip Flexion  5/5    Right Hip Extension  4+/5    Right Hip ABduction  4+/5    Left Hip Flexion  5/5    Left Hip Extension  4/5    Left Hip ABduction  4+/5     Right Knee Flexion  4+/5    Right Knee Extension  5/5    Left Knee Flexion  4/5    Left Knee Extension  5/5    Right Ankle Dorsiflexion  4/5    Left Ankle Dorsiflexion  4-/5      Ambulation/Gait   Ambulation Distance (Feet)  638 Feet 3MWT    Assistive device  None    Gait Pattern  Step-through pattern    Ambulation Surface  Level;Indoor    Gait velocity  1.18 m/s + intermittent claudication began at 1:30 & worsened c/ time      Balance   Balance Assessed  Yes      Static Standing Balance   Static Standing - Balance Support  No upper extremity supported    Static Standing - Level of Assistance  5: Stand by assistance    Static Standing Balance -  Activities   Single Leg Stance - Right Leg;Single Leg Stance - Left Leg    Static Standing - Comment/# of Minutes  Lt and Rt 30 sec however increased difficulty, Lt > Rt                Objective measurements completed on examination: See above findings.              PT Education - 06/16/18 1538    Education Details  Purpose of PT interventions and importance of HEP to improve balance and function long term to compensate for neuropathy    Person(s) Educated  Patient    Methods  Explanation    Comprehension  Verbalized understanding       PT Short Term Goals - 06/16/18 1550      PT SHORT TERM GOAL #1   Title  Patient will be independent in initial HEP and perform regularly.     Time  2    Period  Weeks    Status  New    Target Date  06/30/18      PT SHORT TERM GOAL #2   Title  Patient will exhibit improved MMT of bilateral DF 1/2 grade to exhibit improved strength and stability.    Time  2    Period  Weeks    Status  New      PT SHORT TERM GOAL #3   Title  Patient's   FOTO limitation score will improve by 5 points to exhibit improved functional abilities.     Baseline  43% limited.    Time  2    Period  Weeks    Status  New        PT Long Term Goals - 06/16/18 1553      PT LONG TERM GOAL #1    Title  Patient will be independent in advanced HEP and perform regularly.     Time  4    Period  Weeks    Status  New    Target Date  07/14/18      PT LONG TERM GOAL #2   Title  Patient will exhibit improved MMT of bilateral DF 1 grade to exhibit improved strength and stability.    Time  4    Period  Weeks    Status  New      PT LONG TERM GOAL #3   Title  Patient's FOTO limitation score will improve by 8 points to exhibit improved functional abilities.     Baseline  43% limited    Time  4    Period  Weeks    Status  New             Plan - 06/16/18 1541    Clinical Impression Statement  Patient is a 52 y.o. male c/o peripheral neuropathy likely of ETOH abuse etiology and intermittent claudication symptoms. Patient does exhibit decreased strength in Lt > Rt LE and moreso towards the ankles. He also exhibits deficits in functional ability and dimished balance for age and ability level. Patient was positive to intermittent claudication symptoms during a 3 Minute Walk Test. Patient may benefit from compression stockings to assist with intermittent claudication and venous stasis. Patient would benefit from skilled PT with a focus on development of a HEP for dynamic balance and LE strengthening to address the aforementioned deficits.  Patient reports he is no longer drinking ETOH.    History and Personal Factors relevant to plan of care:  ETOH abuse history, long term tobaco use, venous stasis, intermittent claudication, peripheral neuropathy    Clinical Presentation  Stable    Clinical Presentation due to:  MMT, FOTO, SLS, 3MWT, clinical decision    Clinical Decision Making  Low    Rehab Potential  Good    Clinical Impairments Affecting Rehab Potential  negative - history of anxiety, ETOH abuse; positive - wants to do better     PT Frequency  2x / week    PT Duration  4 weeks    PT Treatment/Interventions  ADLs/Self Care Home Management;DME Instruction;Gait training;Stair  training;Therapeutic activities;Therapeutic exercise;Balance training;Orthotic Fit/Training;Patient/family education;Neuromuscular re-education;Manual techniques;Manual lymph drainage;Compression bandaging;Dry needling;Energy conservation    PT Next Visit Plan  review goals, initiate HEP for balance, strengthening LEs, especially DF and hamstring    Consulted and Agree with Plan of Care  Patient       Patient will benefit from skilled therapeutic intervention in order to improve the following deficits and impairments:  Decreased balance, Decreased endurance, Decreased activity tolerance, Difficulty walking, Decreased strength, Impaired sensation  Visit Diagnosis: Muscle weakness (generalized) - Plan: PT plan of care cert/re-cert  Difficulty in walking, not elsewhere classified - Plan: PT plan of care cert/re-cert  Pain in left foot - Plan: PT plan of care cert/re-cert  Pain in right foot - Plan: PT plan of care cert/re-cert     Problem List Patient Active Problem List   Diagnosis Date Noted  .  Abdominal pain   . Alcoholic hepatitis without ascites   . Hepatic encephalopathy (HCC) 06/01/2018  . Acute respiratory failure with hypoxia (HCC) 06/01/2018  . Alcoholic cirrhosis of liver with ascites (HCC) 06/01/2018  . COPD with acute exacerbation (HCC) 06/01/2018  . Alcoholic peripheral neuropathy (HCC) 06/01/2018  . Increased ammonia level   . COPD, mild (HCC) 05/14/2017  . Right leg claudication (HCC) 05/14/2017  . Prediabetes 02/04/2017  . Chest pain 12/21/2015  . Right leg DVT (HCC) 07/18/2015  . Elevated transaminase measurement 07/18/2015  . Sensory neuropathy 07/16/2015  . Anxiety 12/23/2014  . EtOH dependence (HCC) 02/09/2014  . Headache in back of head 01/12/2014  . Superficial thrombosis of lower extremity 07/01/2013  . Hematochezia 11/17/2012  . Mixed hyperlipidemia 02/15/2012  . Hypertension 01/18/2012  . Insomnia 01/18/2012  . Family history of colonic polyps  11/15/2011  . Palpitations 10/04/2011  . TOBACCO ABUSE 05/18/2010  . GERD 05/18/2010  . CONSTIPATION 05/18/2010  . 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 DYSPHAGIA UNSPECIFIED 05/18/2010  . ABDOMINAL PAIN, RIGHT LOWER QUADRANT 05/18/2010     D. Hartnett-Rands, MS, PT [facility] #[id number] 06/16/2018, 4:07 PM  [facility] [address] [place], [place], [address] Phone: [phone]   Fax:  [phone]  Name: Bradley Miles MRN: [id number] Date of Birth: 12/19/1965  

## 2018-06-16 NOTE — Telephone Encounter (Signed)
Waiting on a response from AB in reference to pt.

## 2018-06-16 NOTE — Progress Notes (Signed)
Total bilirubin has improved. INR improved and normal. His transaminases are increasing again. Alk Phos has increased. Has he resumed drinking?

## 2018-06-17 NOTE — Progress Notes (Signed)
Bradley Miles, yes, please have him come in sooner than September if at all possible. Let me know if issues getting him in.

## 2018-06-19 ENCOUNTER — Other Ambulatory Visit: Payer: Self-pay | Admitting: Family Medicine

## 2018-06-19 ENCOUNTER — Encounter (HOSPITAL_COMMUNITY): Payer: Self-pay

## 2018-06-19 ENCOUNTER — Ambulatory Visit (HOSPITAL_COMMUNITY): Payer: Managed Care, Other (non HMO)

## 2018-06-19 DIAGNOSIS — M79672 Pain in left foot: Secondary | ICD-10-CM

## 2018-06-19 DIAGNOSIS — M6281 Muscle weakness (generalized): Secondary | ICD-10-CM | POA: Diagnosis not present

## 2018-06-19 DIAGNOSIS — R262 Difficulty in walking, not elsewhere classified: Secondary | ICD-10-CM

## 2018-06-19 DIAGNOSIS — M79671 Pain in right foot: Secondary | ICD-10-CM

## 2018-06-19 MED ORDER — SUCRALFATE 1 G PO TABS: ORAL_TABLET | ORAL | 0 refills | Status: DC

## 2018-06-19 NOTE — Patient Instructions (Signed)
Toe / Heel Raise (Standing)    Standing with support, raise heels, then rock back on heels and raise toes. Repeat 15 times.  Copyright  VHI. All rights reserved.   Functional Quadriceps: Sit to Stand    Sit on edge of chair, feet flat on floor. Stand upright, extending knees fully. Repeat 10 times per set. Do 1-2 sets per session. Do 4 sessions per day.  http://orth.exer.us/734   Copyright  VHI. All rights reserved.   SINGLE LIMB STANCE    Stance: single leg on floor. Raise leg. Hold 60 seconds. Repeat with other leg. 3 reps per set, 1-2 sets per day.  Copyright  VHI. All rights reserved.   Tandem Stance    Right foot in front of left, heel touching toe both feet "straight ahead". Stand on Foot Triangle of Support with both feet. Balance in this position 30 seconds. Do with left foot in front of right.  Copyright  VHI. All rights reserved.

## 2018-06-19 NOTE — Therapy (Signed)
[place], [place], [address] Phone: [phone]   Fax:  [phone]  Physical Therapy Treatment  Patient Details  Name: SAVIOR HIMEBAUGH MRN: [id number] Date of Birth: 04-Jan-1966 Referring Provider: Isaac Bliss, Olam Idler   Encounter Date: 06/19/2018  PT End of Session - 06/19/18 1744    Visit Number  2    Number of Visits  8    Date for PT Re-Evaluation  07/14/18 minireassess 06/30/18    Authorization Type  [employer] Managed - deduct $1000 - $942.79 met; OOP - $2000; 20 visit hard max/yr; once meets deduct, won't have to pay co-pay    Authorization Time Period  7/8-->07/14/18    Authorization - Visit Number  2    Authorization - Number of Visits  10    PT Start Time  5797    PT Stop Time  1813    PT Time Calculation (min)  38 min    Activity Tolerance  Patient tolerated treatment well;Patient limited by fatigue;No increased pain    Behavior During Therapy  WFL for tasks assessed/performed       Past Medical History:  Diagnosis Date  . Anxiety   . Enlarged liver   . GERD (gastroesophageal reflux disease)   . Hypertension   . Palpitations   . PVC's (premature ventricular contractions)   . Shortness of breath   . Varicose veins     Past Surgical History:  Procedure Laterality Date  . COLONOSCOPY WITH ESOPHAGOGASTRODUODENOSCOPY (EGD)  12/16/2012   Procedure: COLONOSCOPY WITH ESOPHAGOGASTRODUODENOSCOPY (EGD);  Surgeon: Daneil Dolin, MD;  [facility];  Service: Endoscopy;  Laterality: N/A;  7:30  . cyst reomved     from spine  . ESOPHAGOGASTRODUODENOSCOPY  05/19/09   RMR: Geographic distal esophageal erosions consistent with severe erosive reflux esophagitis. schatzki's ring s/P dilation/small hiatal hernia otherwise normal stomach  . WISDOM TOOTH EXTRACTION      There were no vitals filed for this visit.  Subjective Assessment - 06/19/18 1737    Subjective  Pt c/o stomach muscle pain and some  sharp pain wiht pressure to liver.  Bil feet achey pain 3/10.    Pertinent History  HTN, ETOH abuse, tobacco for 35 years    Patient Stated Goals  strength and conditioning    Currently in Pain?  Yes    Pain Score  3     Pain Location  Ankle Abdomen muscle pain, sharp pain wtih pressure  over liver    Pain Orientation  Left;Right    Pain Descriptors / Indicators  Aching    Pain Type  Acute pain    Pain Onset  More than a month ago 3-4 years neuropathy    Pain Frequency  Intermittent    Aggravating Factors   following swimming soft following pool time, leg pain with walking    Pain Relieving Factors  medication    Effect of Pain on Daily Activities  slower, still completes majority of tasks.                       [facility] Adult PT Treatment/Exercise - 06/19/18 0001      Exercises   Exercises  Knee/Hip      Knee/Hip Exercises: Standing   Heel Raises  15 reps    Heel Raises Limitations  toe raises 15x    Knee Flexion  Both;15 reps    Knee Flexion Limitations  2#  Forward Lunges  Both;10 reps    Forward Lunges Limitations  6in step    Functional Squat  10 reps    SLS  3x each BLE 42"    Other Standing Knee Exercises  tandem stance on foam 3x 30"      Knee/Hip Exercises: Seated   Sit to Sand  10 reps;without UE support eccentric control             PT Education - 06/19/18 1744    Education Details  Reviewed goals, established HEP and copy of eval given to pt.  Educated benefits with compliance of HEP for maximal benefits.      Person(s) Educated  Patient    Methods  Explanation;Demonstration;Handout    Comprehension  Verbalized understanding;Returned demonstration       PT Short Term Goals - 06/16/18 1550      PT SHORT TERM GOAL #1   Title  Patient will be independent in initial HEP and perform regularly.     Time  2    Period  Weeks    Status  New    Target Date  06/30/18      PT SHORT TERM GOAL #2   Title  Patient will exhibit improved MMT of  bilateral DF 1/2 grade to exhibit improved strength and stability.    Time  2    Period  Weeks    Status  New      PT SHORT TERM GOAL #3   Title  Patient's FOTO limitation score will improve by 5 points to exhibit improved functional abilities.     Baseline  43% limited.    Time  2    Period  Weeks    Status  New        PT Long Term Goals - 06/16/18 1553      PT LONG TERM GOAL #1   Title  Patient will be independent in advanced HEP and perform regularly.     Time  4    Period  Weeks    Status  New    Target Date  07/14/18      PT LONG TERM GOAL #2   Title  Patient will exhibit improved MMT of bilateral DF 1 grade to exhibit improved strength and stability.    Time  4    Period  Weeks    Status  New      PT LONG TERM GOAL #3   Title  Patient's FOTO limitation score will improve by 8 points to exhibit improved functional abilities.     Baseline  43% limited    Time  4    Period  Weeks    Status  New            Plan - 06/19/18 1813    Clinical Impression Statement  Reviewed goals and copy of eval given to pt.  Session focus on LE strengthening and balance activities.  Pt able to complete all therex with form following initial cueing, no reports of increased pain, noted musculature fatigue with tasks.  Established HEP, pt able to demonstrate and verbalize appropraite mechanics with all exercises.      Rehab Potential  Good    Clinical Impairments Affecting Rehab Potential  negative - history of anxiety, ETOH abuse; positive - wants to do better     PT Frequency  2x / week    PT Duration  4 weeks    PT Treatment/Interventions  ADLs/Self Care Home Management;DME Instruction;Gait  training;Stair training;Therapeutic activities;Therapeutic exercise;Balance training;Orthotic Fit/Training;Patient/family education;Neuromuscular re-education;Manual techniques;Manual lymph drainage;Compression bandaging;Dry needling;Energy conservation    PT Next Visit Plan  F/U wiht soreness  following initial session.  Progress strengthening BLE especially DF and hamstrings.      PT Home Exercise Plan  06/19/18: heel/toe raises, STS, SLS and tandem stance.       Patient will benefit from skilled therapeutic intervention in order to improve the following deficits and impairments:  Decreased balance, Decreased endurance, Decreased activity tolerance, Difficulty walking, Decreased strength, Impaired sensation  Visit Diagnosis: Muscle weakness (generalized)  Difficulty in walking, not elsewhere classified  Pain in left foot  Pain in right foot     Problem List Patient Active Problem List   Diagnosis Date Noted  . Abdominal pain   . Alcoholic hepatitis without ascites   . Hepatic encephalopathy ([place]) 06/01/2018  . Acute respiratory failure with hypoxia ([place]) 06/01/2018  . Alcoholic cirrhosis of liver with ascites ([place]) 06/01/2018  . COPD with acute exacerbation ([place]) 06/01/2018  . Alcoholic peripheral neuropathy ([place]) 06/01/2018  . Increased ammonia level   . COPD, mild ([place]) 05/14/2017  . Right leg claudication ([place]) 05/14/2017  . Prediabetes 02/04/2017  . Chest pain 12/21/2015  . Right leg DVT ([place]) 07/18/2015  . Elevated transaminase measurement 07/18/2015  . Sensory neuropathy 07/16/2015  . Anxiety 12/23/2014  . EtOH dependence ([place]) 02/09/2014  . Headache in back of head 01/12/2014  . Superficial thrombosis of lower extremity 07/01/2013  . Hematochezia 11/17/2012  . Mixed hyperlipidemia 02/15/2012  . Hypertension 01/18/2012  . Insomnia 01/18/2012  . Family history of colonic polyps 11/15/2011  . Palpitations 10/04/2011  . TOBACCO ABUSE 05/18/2010  . GERD 05/18/2010  . CONSTIPATION 05/18/2010  . 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 DYSPHAGIA UNSPECIFIED 05/18/2010  . ABDOMINAL PAIN, RIGHT LOWER QUADRANT 05/18/2010   Ihor Austin, [place]; CBIS [phone]  Aldona Lento 06/19/2018, 6:19 PM  [place], [place], [address] Phone: [phone]   Fax:  [phone]  Name: JACQUIS PAXTON MRN: [id number] Date of Birth: 01-17-1966

## 2018-06-23 DIAGNOSIS — Z0289 Encounter for other administrative examinations: Secondary | ICD-10-CM

## 2018-06-24 ENCOUNTER — Ambulatory Visit (HOSPITAL_COMMUNITY): Payer: Managed Care, Other (non HMO)

## 2018-06-24 ENCOUNTER — Encounter (HOSPITAL_COMMUNITY): Payer: Self-pay

## 2018-06-24 ENCOUNTER — Telehealth: Payer: Self-pay | Admitting: Family Medicine

## 2018-06-24 DIAGNOSIS — M79672 Pain in left foot: Secondary | ICD-10-CM

## 2018-06-24 DIAGNOSIS — M79671 Pain in right foot: Secondary | ICD-10-CM

## 2018-06-24 DIAGNOSIS — M6281 Muscle weakness (generalized): Secondary | ICD-10-CM

## 2018-06-24 DIAGNOSIS — R262 Difficulty in walking, not elsewhere classified: Secondary | ICD-10-CM

## 2018-06-24 NOTE — Therapy (Signed)
[place], [place], [address] Phone: [phone]   Fax:  [phone]  Physical Therapy Treatment  Patient Details  Name: Bradley Miles MRN: [id number] Date of Birth: 1966-01-15 Referring Provider: Isaac Bliss, Olam Idler   Encounter Date: 06/24/2018  PT End of Session - 06/24/18 1043    Visit Number  3    Number of Visits  8    Date for PT Re-Evaluation  07/14/18 MInireassess 06/30/18    Authorization Type  [employer] Managed - deduct $1000 - $942.79 met; OOP - $2000; 20 visit hard max/yr; once meets deduct, won't have to pay co-pay    Authorization Time Period  7/8-->07/14/18    Authorization - Visit Number  3    Authorization - Number of Visits  10    PT Start Time  1038    PT Stop Time  1120    PT Time Calculation (min)  42 min    Activity Tolerance  Patient tolerated treatment well;Patient limited by fatigue;No increased pain    Behavior During Therapy  WFL for tasks assessed/performed       Past Medical History:  Diagnosis Date  . Anxiety   . Enlarged liver   . GERD (gastroesophageal reflux disease)   . Hypertension   . Palpitations   . PVC's (premature ventricular contractions)   . Shortness of breath   . Varicose veins     Past Surgical History:  Procedure Laterality Date  . COLONOSCOPY WITH ESOPHAGOGASTRODUODENOSCOPY (EGD)  12/16/2012   Procedure: COLONOSCOPY WITH ESOPHAGOGASTRODUODENOSCOPY (EGD);  Surgeon: Daneil Dolin, MD;  [facility];  Service: Endoscopy;  Laterality: N/A;  7:30  . cyst reomved     from spine  . ESOPHAGOGASTRODUODENOSCOPY  05/19/09   RMR: Geographic distal esophageal erosions consistent with severe erosive reflux esophagitis. schatzki's ring s/P dilation/small hiatal hernia otherwise normal stomach  . WISDOM TOOTH EXTRACTION      There were no vitals filed for this visit.  Subjective Assessment - 06/24/18 1041    Subjective  Pt stated he has completed some of  the HEP at home, hasnt complete all exercises at home.  Current pain scale 3/10 stomach and Bil feet.    Patient Stated Goals  strength and conditioning    Currently in Pain?  Yes    Pain Score  3     Pain Location  Foot    Pain Orientation  Right;Left    Pain Descriptors / Indicators  Numbness;Pins and needles;Aching achey stomach with breathing/pressure and numbness/tingling Bil feet    Pain Type  Acute pain    Pain Onset  More than a month ago 3-4 years of neuropathy    Pain Frequency  Intermittent                       [facility] Adult PT Treatment/Exercise - 06/24/18 0001      Knee/Hip Exercises: Standing   Heel Raises  15 reps    Heel Raises Limitations  toe raises 15x    Forward Lunges  Both;15 reps    Forward Lunges Limitations  6in step no HHA    Lateral Step Up  Both;10 reps;Hand Hold: 0;Step Height: 6"    Forward Step Up  Both;15 reps;Hand Hold: 1;Step Height: 6"    Functional Squat  10 reps    SLS  Rt 42", Lt 56"    SLS with Vectors  3x 5 " with 1 HHA (  last 2 sets on foam); 1 set no HHA BLE x 5" holds    Gait Training  reviewed gait mechanics with appropraite mechanics 226ft    Other Standing Knee Exercises  tandem stance on foam 3x 30" (last set palvo with RTB)      Knee/Hip Exercises: Seated   Stool Scoot - Round Trips  1RT each LE    Sit to Sand  10 reps;without UE support               PT Short Term Goals - 06/16/18 1550      PT SHORT TERM GOAL #1   Title  Patient will be independent in initial HEP and perform regularly.     Time  2    Period  Weeks    Status  New    Target Date  06/30/18      PT SHORT TERM GOAL #2   Title  Patient will exhibit improved MMT of bilateral DF 1/2 grade to exhibit improved strength and stability.    Time  2    Period  Weeks    Status  New      PT SHORT TERM GOAL #3   Title  Patient's FOTO limitation score will improve by 5 points to exhibit improved functional abilities.     Baseline  43% limited.     Time  2    Period  Weeks    Status  New        PT Long Term Goals - 06/16/18 1553      PT LONG TERM GOAL #1   Title  Patient will be independent in advanced HEP and perform regularly.     Time  4    Period  Weeks    Status  New    Target Date  07/14/18      PT LONG TERM GOAL #2   Title  Patient will exhibit improved MMT of bilateral DF 1 grade to exhibit improved strength and stability.    Time  4    Period  Weeks    Status  New      PT LONG TERM GOAL #3   Title  Patient's FOTO limitation score will improve by 8 points to exhibit improved functional abilities.     Baseline  43% limited    Time  4    Period  Weeks    Status  New            Plan - 06/24/18 1141    Clinical Impression Statement  Session focus with functional strenghtening and balalnce activities.  Pt progressing well with some soreness following last session but tolerated session well.  Pt demonstrated good SLS today, progressed to vector stance and palvo activities for core strengthening with tandem stance activities.  Began forward and lateral step up activities for functional strengthening, minimal difficulty with unsteadiness with lateral step ups.  No reports of increased pain through session, was limited by fatigue.      Rehab Potential  Good    Clinical Impairments Affecting Rehab Potential  negative - history of anxiety, ETOH abuse; positive - wants to do better     PT Frequency  2x / week    PT Duration  4 weeks    PT Treatment/Interventions  ADLs/Self Care Home Management;DME Instruction;Gait training;Stair training;Therapeutic activities;Therapeutic exercise;Balance training;Orthotic Fit/Training;Patient/family education;Neuromuscular re-education;Manual techniques;Manual lymph drainage;Compression bandaging;Dry needling;Energy conservation    PT Next Visit Plan  Progress strengthening BLE especially DF and hamstrings.        PT Home Exercise Plan  06/19/18: heel/toe raises, STS, SLS and tandem  stance.       Patient will benefit from skilled therapeutic intervention in order to improve the following deficits and impairments:  Decreased balance, Decreased endurance, Decreased activity tolerance, Difficulty walking, Decreased strength, Impaired sensation  Visit Diagnosis: Muscle weakness (generalized)  Difficulty in walking, not elsewhere classified  Pain in left foot  Pain in right foot     Problem List Patient Active Problem List   Diagnosis Date Noted  . Abdominal pain   . Alcoholic hepatitis without ascites   . Hepatic encephalopathy ([place]) 06/01/2018  . Acute respiratory failure with hypoxia ([place]) 06/01/2018  . Alcoholic cirrhosis of liver with ascites ([place]) 06/01/2018  . COPD with acute exacerbation ([place]) 06/01/2018  . Alcoholic peripheral neuropathy ([place]) 06/01/2018  . Increased ammonia level   . COPD, mild ([place]) 05/14/2017  . Right leg claudication ([place]) 05/14/2017  . Prediabetes 02/04/2017  . Chest pain 12/21/2015  . Right leg DVT ([place]) 07/18/2015  . Elevated transaminase measurement 07/18/2015  . Sensory neuropathy 07/16/2015  . Anxiety 12/23/2014  . EtOH dependence ([place]) 02/09/2014  . Headache in back of head 01/12/2014  . Superficial thrombosis of lower extremity 07/01/2013  . Hematochezia 11/17/2012  . Mixed hyperlipidemia 02/15/2012  . Hypertension 01/18/2012  . Insomnia 01/18/2012  . Family history of colonic polyps 11/15/2011  . Palpitations 10/04/2011  . TOBACCO ABUSE 05/18/2010  . GERD 05/18/2010  . CONSTIPATION 05/18/2010  . 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 DYSPHAGIA UNSPECIFIED 05/18/2010  . ABDOMINAL PAIN, RIGHT LOWER QUADRANT 05/18/2010   Ihor Austin, [place]; [place] Aldona Lento 06/24/2018, 11:46 AM  [place] [address] [place], [place], [address] Phone: [phone]   Fax:  [phone]  Name: Bradley Miles MRN: [id number] Date of Birth:  10-04-1966

## 2018-06-24 NOTE — Telephone Encounter (Signed)
Patient brought by short term disability to be filled out as soon as possible. He has follow up appointment on 7/18 and page 2 has questions that need to be addressed while patient is here, form is in your yellow folder.Review form,date,sign.

## 2018-06-26 ENCOUNTER — Encounter (HOSPITAL_COMMUNITY): Payer: Self-pay | Admitting: Physical Therapy

## 2018-06-26 ENCOUNTER — Ambulatory Visit (INDEPENDENT_AMBULATORY_CARE_PROVIDER_SITE_OTHER): Payer: Managed Care, Other (non HMO) | Admitting: Family Medicine

## 2018-06-26 ENCOUNTER — Encounter: Payer: Self-pay | Admitting: Family Medicine

## 2018-06-26 ENCOUNTER — Ambulatory Visit (HOSPITAL_COMMUNITY): Payer: Managed Care, Other (non HMO) | Admitting: Physical Therapy

## 2018-06-26 VITALS — BP 112/70 | Ht 73.0 in | Wt 251.0 lb

## 2018-06-26 DIAGNOSIS — J449 Chronic obstructive pulmonary disease, unspecified: Secondary | ICD-10-CM

## 2018-06-26 DIAGNOSIS — R748 Abnormal levels of other serum enzymes: Secondary | ICD-10-CM | POA: Diagnosis not present

## 2018-06-26 DIAGNOSIS — M6281 Muscle weakness (generalized): Secondary | ICD-10-CM

## 2018-06-26 DIAGNOSIS — R160 Hepatomegaly, not elsewhere classified: Secondary | ICD-10-CM | POA: Diagnosis not present

## 2018-06-26 DIAGNOSIS — R262 Difficulty in walking, not elsewhere classified: Secondary | ICD-10-CM

## 2018-06-26 DIAGNOSIS — M79672 Pain in left foot: Secondary | ICD-10-CM

## 2018-06-26 DIAGNOSIS — M79671 Pain in right foot: Secondary | ICD-10-CM

## 2018-06-26 DIAGNOSIS — G629 Polyneuropathy, unspecified: Secondary | ICD-10-CM | POA: Diagnosis not present

## 2018-06-26 MED ORDER — NORTRIPTYLINE HCL 50 MG PO CAPS: 50.0000 mg | ORAL_CAPSULE | Freq: Every day | ORAL | 5 refills | Status: DC

## 2018-06-26 NOTE — Therapy (Signed)
[place], [place], [address] Phone: [phone]   Fax:  [phone]  Physical Therapy Treatment  Patient Details  Name: Bradley Miles MRN: [id number] Date of Birth: 03-04-1966 Referring Provider: Isaac Bliss, Olam Idler   Encounter Date: 06/26/2018  PT End of Session - 06/26/18 1120    Visit Number  4    Number of Visits  8    Date for PT Re-Evaluation  07/14/18 MInireassess 06/30/18    Authorization Type  [employer] Managed - deduct $1000 - $942.79 met; OOP - $2000; 20 visit hard max/yr; once meets deduct, won't have to pay co-pay    Authorization Time Period  7/8-->07/14/18    Authorization - Visit Number  4    Authorization - Number of Visits  10    PT Start Time  1115    PT Stop Time  1153    PT Time Calculation (min)  38 min    Activity Tolerance  Patient tolerated treatment well;Patient limited by fatigue;No increased pain    Behavior During Therapy  WFL for tasks assessed/performed       Past Medical History:  Diagnosis Date  . Anxiety   . Enlarged liver   . GERD (gastroesophageal reflux disease)   . Hypertension   . Palpitations   . PVC's (premature ventricular contractions)   . Shortness of breath   . Varicose veins     Past Surgical History:  Procedure Laterality Date  . COLONOSCOPY WITH ESOPHAGOGASTRODUODENOSCOPY (EGD)  12/16/2012   Procedure: COLONOSCOPY WITH ESOPHAGOGASTRODUODENOSCOPY (EGD);  Surgeon: Daneil Dolin, MD;  [facility];  Service: Endoscopy;  Laterality: N/A;  7:30  . cyst reomved     from spine  . ESOPHAGOGASTRODUODENOSCOPY  05/19/09   RMR: Geographic distal esophageal erosions consistent with severe erosive reflux esophagitis. schatzki's ring s/P dilation/small hiatal hernia otherwise normal stomach  . WISDOM TOOTH EXTRACTION      There were no vitals filed for this visit.  Subjective Assessment - 06/26/18 1117    Subjective  Patient reported that he has  neuropathy in his bilateral feet which he said is causing pain he would rate as a 5/10.     Patient Stated Goals  strength and conditioning    Currently in Pain?  Yes    Pain Score  5     Pain Location  Foot Feet    Pain Orientation  Right;Left    Pain Descriptors / Indicators  Pins and needles    Pain Type  Acute pain    Pain Onset  More than a month ago 3-4 years of neuropathy                       [facility] Adult PT Treatment/Exercise - 06/26/18 0001      Knee/Hip Exercises: Standing   Heel Raises  15 reps    Heel Raises Limitations  toe raises 15x    Knee Flexion  Both;15 reps    Knee Flexion Limitations  2# ankle weight    Forward Lunges  Both;15 reps    Forward Lunges Limitations  6in step no HHA    Lateral Step Up  Both;10 reps;Hand Hold: 0;Step Height: 6";Other (comment) Heel tap with mirror for visual cue    Forward Step Up  Both;15 reps;Hand Hold: 1;Step Height: 6"    Functional Squat  10 reps    SLS  Rt 50", Lt 39"    SLS  with Vectors  5x 5'' holds on foam each lower extremity    Other Standing Knee Exercises  Palloff press with red theraband tandem stance on foam x 20 each lower extremity forward      Knee/Hip Exercises: Seated   Stool Scoot - Round Trips  14 feet 1RT each LE to strengthen hamstrings and quads    Sit to Sand  10 reps;without UE support      Knee/Hip Exercises: Prone   Other Prone Exercises  Nordic Hamstring curl modified exercise with physioball roll out x 10              PT Education - 06/26/18 1120    Education Details  Discussed purpose and technique of exercises throughout session.     Person(s) Educated  Patient    Methods  Explanation    Comprehension  Verbalized understanding       PT Short Term Goals - 06/16/18 1550      PT SHORT TERM GOAL #1   Title  Patient will be independent in initial HEP and perform regularly.     Time  2    Period  Weeks    Status  New    Target Date  06/30/18      PT SHORT TERM GOAL #2    Title  Patient will exhibit improved MMT of bilateral DF 1/2 grade to exhibit improved strength and stability.    Time  2    Period  Weeks    Status  New      PT SHORT TERM GOAL #3   Title  Patient's FOTO limitation score will improve by 5 points to exhibit improved functional abilities.     Baseline  43% limited.    Time  2    Period  Weeks    Status  New        PT Long Term Goals - 06/16/18 1553      PT LONG TERM GOAL #1   Title  Patient will be independent in advanced HEP and perform regularly.     Time  4    Period  Weeks    Status  New    Target Date  07/14/18      PT LONG TERM GOAL #2   Title  Patient will exhibit improved MMT of bilateral DF 1 grade to exhibit improved strength and stability.    Time  4    Period  Weeks    Status  New      PT LONG TERM GOAL #3   Title  Patient's FOTO limitation score will improve by 8 points to exhibit improved functional abilities.     Baseline  43% limited    Time  4    Period  Weeks    Status  New            Plan - 06/26/18 1206    Clinical Impression Statement  This session continued to progress the patient with lower extremity strengthening and balance exercises with a focus on hamstring strengthening and dorsiflexion strengthening. This session added heel walking which patient tolerated well. Also added Nordic hamstring curl modified strengthening exercise to challenge hamstring strength. Patient tolerated all exercises well and only required 1 rest break due to irritation of his knees following heel taps. Patient would benefit from continued skilled physical therapy to continue progressing toward functional goals.     Rehab Potential  Good    Clinical Impairments Affecting Rehab Potential  negative -  history of anxiety, ETOH abuse; positive - wants to do better     PT Frequency  2x / week    PT Duration  4 weeks    PT Treatment/Interventions  ADLs/Self Care Home Management;DME Instruction;Gait training;Stair  training;Therapeutic activities;Therapeutic exercise;Balance training;Orthotic Fit/Training;Patient/family education;Neuromuscular re-education;Manual techniques;Manual lymph drainage;Compression bandaging;Dry needling;Energy conservation    PT Next Visit Plan  Progress strengthening BLE especially DF and hamstrings.      PT Home Exercise Plan  06/19/18: heel/toe raises, STS, SLS and tandem stance.       Patient will benefit from skilled therapeutic intervention in order to improve the following deficits and impairments:  Decreased balance, Decreased endurance, Decreased activity tolerance, Difficulty walking, Decreased strength, Impaired sensation  Visit Diagnosis: Muscle weakness (generalized)  Difficulty in walking, not elsewhere classified  Pain in left foot  Pain in right foot     Problem List Patient Active Problem List   Diagnosis Date Noted  . Abdominal pain   . Alcoholic hepatitis without ascites   . Hepatic encephalopathy ([place]) 06/01/2018  . Acute respiratory failure with hypoxia ([place]) 06/01/2018  . Alcoholic cirrhosis of liver with ascites ([place]) 06/01/2018  . COPD with acute exacerbation ([place]) 06/01/2018  . Alcoholic peripheral neuropathy ([place]) 06/01/2018  . Increased ammonia level   . COPD, mild ([place]) 05/14/2017  . Right leg claudication ([place]) 05/14/2017  . Prediabetes 02/04/2017  . Chest pain 12/21/2015  . Right leg DVT ([place]) 07/18/2015  . Elevated transaminase measurement 07/18/2015  . Sensory neuropathy 07/16/2015  . Anxiety 12/23/2014  . EtOH dependence ([place]) 02/09/2014  . Headache in back of head 01/12/2014  . Superficial thrombosis of lower extremity 07/01/2013  . Hematochezia 11/17/2012  . Mixed hyperlipidemia 02/15/2012  . Hypertension 01/18/2012  . Insomnia 01/18/2012  . Family history of colonic polyps 11/15/2011  . Palpitations 10/04/2011  . TOBACCO ABUSE 05/18/2010  . GERD 05/18/2010  . CONSTIPATION 05/18/2010  . ERECTILE DYSFUNCTION, ORGANIC  05/18/2010  . DYSPHAGIA UNSPECIFIED 05/18/2010  . ABDOMINAL PAIN, RIGHT LOWER QUADRANT 05/18/2010   Clarene Critchley PT, DPT 12:07 PM, 06/26/18 [place], [place], [address] Phone: [phone]   Fax:  [phone]  Name: ROXY FILLER MRN: [id number] Date of Birth: 14-Apr-1966

## 2018-06-26 NOTE — Progress Notes (Signed)
   Subjective:    Patient ID: Bradley Miles, male    DOB: 1966/02/24, 52 y.o.   MRN: [id number]  HPIFollow up hospitalization. Pt states he is some better. Still having abdominal pain.   Stomach still uncomfortable  Pressure ion right side painful and tender  Not trid to lifty nthing heayv  Using one ot tow ativan per day     None alcohol    Doing the out t counselling for the alcohol abuse    atending aa meeings   Review of Systems No headache, no major weight loss or weight gain, no chest pain no back pain abdominal pain no change in bowel habits complete ROS otherwise negative   Patient    Objective:   Physical Exam Alert and oriented, vitals reviewed and stable, NAD ENT-TM's and ext canals WNL bilat via otoscopic exam Soft palate, tonsils and post pharynx WNL via oropharyngeal exam Neck-symmetric, no masses; thyroid nonpalpable and nontender Pulmonary-no tachypnea or accessory muscle use; Clear without wheezes via auscultation Card--no abnrml murmurs, rhythm reg and rate WNL Carotid pulses symmetric, without bruits Positive right upper quadrant tenderness.  Mild epigastric tenderness.  No rebound no guarding.  Positive palpable edge of the liver noted  Impression.  Alcoholic hepatitis.  Patient continues to improve clinically.  Still having challenges with discomfort.  Also element of reflux.  2.  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  Has had a work-up past.  See results.  Patient's brings in question regarding use of amitriptyline for his symptoms.  Patient already on gabapentin.  Long discussion held.  Will attempt nortriptyline to see if it helps.  Rationale discussed.  Excuse extended.  All up as scheduled.  Ongoing need to avoid alcohol discussed at great length.  Patient continuing [employer] meetings which are crucial.  Greater than 50% of this 25 minute face to face visit was spent in counseling and discussion and coordination of care  regarding the above diagnosis/diagnosies          Positive right upper quadrant tenderness.Assessment & Plan:

## 2018-06-27 ENCOUNTER — Encounter: Payer: Self-pay | Admitting: Gastroenterology

## 2018-06-27 ENCOUNTER — Ambulatory Visit (INDEPENDENT_AMBULATORY_CARE_PROVIDER_SITE_OTHER): Payer: Managed Care, Other (non HMO) | Admitting: Gastroenterology

## 2018-06-27 ENCOUNTER — Telehealth: Payer: Self-pay | Admitting: Family Medicine

## 2018-06-27 DIAGNOSIS — K760 Fatty (change of) liver, not elsewhere classified: Secondary | ICD-10-CM | POA: Diagnosis not present

## 2018-06-27 NOTE — Telephone Encounter (Signed)
Patient was seen yesterday and called this morning checking on FMLA he dropped off on 7/15 stating he needs them faxed in by 7/23 also another form came in yesterday while patient was here just waiting on dictation from yesterday visit so it can be fill in

## 2018-06-27 NOTE — Progress Notes (Signed)
Referring Provider: [name], MD Primary Care Physician:  [name], MD  Primary GI: Dr. Gala Romney   Chief Complaint  Patient presents with  . Abdominal Pain  . Cirrhosis    hosp f/u    HPI:   Bradley Miles is a 52 y.o. male presenting today with a history of admission in June 2019 secondary to abdominal pain and confusion. Found to have elevated LFTs in setting of ETOH abuse, no prednisolone indicated due to low DF. AKI noted and improved with supportive measures. Diffuse hepatic steatosis noted on imaging but no splenomegaly. Hep B core antibody and Hep A antibody IgM negative. Hep C antibody negative. Hep B surface antigen negative.   Last HFP done on 7/5 with Tbili 2.3 (improved from prior evaluation), Alk Phos increased to 154, and transaminases increased from discharged with AST 99 and ALT 53. Gallstones noted on imaging. No cholecystitis. Looking back through records, he has had chronically elevated transaminases since 2015. He notes that he started drinking in 2015.   When taking a deep breath, has discomfort. RUQ uncomfortable. Intermittent discomfort after eating, "knows it is there". No N/V. Currently no pain. If mashing on it, it's a 4 or 5. Much improved since hospitalization. No ETOH since hospitalization.     Past Medical History:  Diagnosis Date  . Anxiety   . Enlarged liver   . GERD (gastroesophageal reflux disease)   . Hypertension   . Palpitations   . PVC's (premature ventricular contractions)   . Shortness of breath   . Varicose veins     Past Surgical History:  Procedure Laterality Date  . COLONOSCOPY WITH ESOPHAGOGASTRODUODENOSCOPY (EGD)  12/16/2012   internal hemorrhoids, colonic diverticulosis, benign polyps, screening in 2024. EGD with mild erosive reflux esophagitis, small hiatal hernia, negative H.pylori  . cyst reomved     from spine  . ESOPHAGOGASTRODUODENOSCOPY  05/19/09   RMR: Geographic distal esophageal erosions consistent with  severe erosive reflux esophagitis. schatzki's ring s/P dilation/small hiatal hernia otherwise normal stomach  . WISDOM TOOTH EXTRACTION      Current Outpatient Medications  Medication Sig Dispense Refill  . acebutolol (SECTRAL) 400 MG capsule TAKE 1 CAPSULE BY MOUTH TWICE A DAY 60 capsule 5  . albuterol (PROVENTIL HFA;VENTOLIN HFA) 108 (90 Base) MCG/ACT inhaler Inhale 2 puffs into the lungs every 6 (six) hours as needed for wheezing or shortness of breath. 1 Inhaler 2  . allopurinol (ZYLOPRIM) 100 MG tablet TAKE 1 TABLET BY MOUTH ONCE DAILY 30 tablet 5  . dexlansoprazole (DEXILANT) 60 MG capsule Take 1 capsule (60 mg total) by mouth daily. 30 capsule 11  . FIBER PO Take by mouth daily.    Marland Kitchen gabapentin (NEURONTIN) 300 MG capsule TAKE ONE CAPSULE BY MOUTH FOUR TIMES DAILY 360 capsule 2  . LORazepam (ATIVAN) 1 MG tablet Take 1 tablet (1 mg total) by mouth 2 (two) times daily as needed for anxiety. 60 tablet 2  . Multiple Vitamin (MULTIVITAMIN WITH MINERALS) TABS tablet Take 1 tablet by mouth daily.    . nortriptyline (PAMELOR) 50 MG capsule Take 1 capsule (50 mg total) by mouth at bedtime. 30 capsule 5  . Omega-3 Fatty Acids (FISH OIL PO) Take 3 capsules by mouth daily.    Marland Kitchen oxyCODONE (OXY IR/ROXICODONE) 5 MG immediate release tablet Take 1 tablet (5 mg total) by mouth every 6 (six) hours as needed for severe pain. 20 tablet 0  . ranitidine (ZANTAC) 150 MG tablet Take 150  mg by mouth as needed for heartburn.    . sildenafil (VIAGRA) 50 MG tablet Take 50 mg by mouth daily as needed for erectile dysfunction.    . sucralfate (CARAFATE) 1 g tablet One po Ac 90 tablet 0  . thiamine 100 MG tablet Take 1 tablet (100 mg total) by mouth daily.    . vitamin B-12 (CYANOCOBALAMIN) 1000 MCG tablet Take 1,000 mcg by mouth daily.     No current facility-administered medications for this visit.     Allergies as of 06/27/2018  . (No Known Allergies)    Family History  Problem Relation Age of Onset  .  Cancer Mother        ?etiology  . Heart disease Mother   . Colon polyps Sister 71       two sisters  . Liver disease Neg Hx     Social History   Socioeconomic History  . Marital status: Significant Other    Spouse name: Not on file  . Number of children: 2  . Years of education: Not on file  . Highest education level: Not on file  Occupational History  . 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 Financial resource strain: Not on file  . Food insecurity:    Worry: Not on file    Inability: Not on file  . Transportation needs:    Medical: Not on file    Non-medical: Not on file  Tobacco Use  . Smoking status: Current Every Day Smoker    Packs/day: 1.50    Years: 30.00    Pack years: 45.00    Types: Cigarettes    Start date: 12/24/1979  . Smokeless tobacco: Never Used  Substance and Sexual Activity  . Alcohol use: Not Currently    Alcohol/week: 0.0 oz    Comment: consumes beer or liquor three to four times weekly. Up to six pack or three shots each occurence or more x 20+ yrs. Denied 06/27/18  . Drug use: No  . Sexual activity: Yes    Partners: Female    Birth control/protection: None  Lifestyle  . Physical activity:    Days per week: Not on file    Minutes per session: Not on file  . Stress: Not on file  Relationships  . Social connections:    Talks on phone: Not on file    Gets together: Not on file    Attends religious service: Not on file    Active member of club or organization: Not on file    Attends meetings of clubs or organizations: Not on file    Relationship status: Not on file  Other Topics Concern  . Not on file  Social History Narrative   Lives alone  Girlfriend & kids 1/2 time    Review of Systems: Gen: Denies fever, chills, anorexia. Denies fatigue, weakness, weight loss.  CV: Denies chest pain, palpitations, syncope, peripheral edema, and claudication. Resp: Denies dyspnea at rest, cough, wheezing,  coughing up blood, and pleurisy. GI: see HPI  Derm: Denies rash, itching, dry skin Psych: Denies depression, anxiety, memory loss, confusion. No homicidal or suicidal ideation.  Heme: Denies bruising, bleeding, and enlarged lymph nodes.  Physical Exam: BP 134/88   Pulse 75   Temp (!) 97.3 F (36.3 C) (Oral)   Ht 6' 1"  (1.854 m)   Wt 251 lb 6.4 oz (114 kg)   BMI 33.17 kg/m  General:   Alert and oriented.  No distress noted. Pleasant and cooperative.  Head:  Normocephalic and atraumatic. Eyes:  Conjuctiva clear without scleral icterus. Mouth:  Oral mucosa pink and moist.  Abdomen:  +BS, soft, TTP upper abdomen, RUQ and non-distended. No rebound or guarding.  Msk:  Symmetrical without gross deformities. Normal posture. Extremities:  Without edema. Neurologic:  Alert and  oriented x4 Psych:  Alert and cooperative. Normal mood and affect.  Lab Results  Component Value Date   ALT 53 (H) 06/13/2018   AST 99 (H) 06/13/2018   ALKPHOS 154 (H) 06/13/2018   BILITOT 2.3 (H) 06/13/2018

## 2018-06-27 NOTE — Assessment & Plan Note (Signed)
History of admission June 2081 for alcoholic hepatitis, acute renal failure, now improved. No prednisolone needed due to low DF. LFTs have been fluctuating, and he denies any ETOH intake since hospitalization. Known hepatic steatosis, normal spleen. Doubt underlying advanced disease.  Recheck HFP, CBC, INR now Consider further serologies if remaining elevated Return in 3 months Discussed management of fatty liver

## 2018-06-27 NOTE — Patient Instructions (Signed)
I am so proud of you! Keep up the good work with alcohol cessation.   I have ordered blood work today. You had a recent A1c, so we don't need to repeat this now. It was 5.5 on June 01, 2018.   If numbers are still elevated, we will do further tests.   I will see you in 3 months regardless!  It was a pleasure to see you today. I strive to create trusting relationships with patients to provide genuine, compassionate, and quality care. I value your feedback. If you receive a survey regarding your visit,  I greatly appreciate you taking time to fill this out.   Annitta Needs, PhD, ANP-BC [facility]

## 2018-06-30 ENCOUNTER — Telehealth: Payer: Self-pay | Admitting: Family Medicine

## 2018-06-30 NOTE — Telephone Encounter (Signed)
Patient is wanting you to call him about his disability paper work.I tried to explained to him that you have them for review and signature. He states needing them be faxed in today if possible because he has been out of work since 6/23.

## 2018-06-30 NOTE — Progress Notes (Signed)
cc'ed to pcp °

## 2018-06-30 NOTE — Telephone Encounter (Signed)
[employer] sent over this treatment/self management plan on patient to be fill out and faxed back. I filled in what I could please review and fill in highlighted areas.In your yellow folder

## 2018-07-03 LAB — CBC WITH DIFFERENTIAL
Basophils Absolute: 0.1 10*3/uL (ref 0.0–0.2)
Basos: 1 %
EOS (ABSOLUTE): 0.4 10*3/uL (ref 0.0–0.4)
Eos: 5 %
Hematocrit: 38.8 % (ref 37.5–51.0)
Hemoglobin: 13.2 g/dL (ref 13.0–17.7)
IMMATURE GRANS (ABS): 0 10*3/uL (ref 0.0–0.1)
Immature Granulocytes: 0 %
LYMPHS: 18 %
Lymphocytes Absolute: 1.2 10*3/uL (ref 0.7–3.1)
MCH: 36.6 pg — ABNORMAL HIGH (ref 26.6–33.0)
MCHC: 34 g/dL (ref 31.5–35.7)
MCV: 108 fL — ABNORMAL HIGH (ref 79–97)
Monocytes Absolute: 0.5 10*3/uL (ref 0.1–0.9)
Monocytes: 8 %
NEUTROS PCT: 68 %
Neutrophils Absolute: 4.7 10*3/uL (ref 1.4–7.0)
RBC: 3.61 x10E6/uL — AB (ref 4.14–5.80)
RDW: 11.8 % — ABNORMAL LOW (ref 12.3–15.4)
WBC: 6.9 10*3/uL (ref 3.4–10.8)

## 2018-07-03 LAB — CMP AND LIVER
ALT: 28 IU/L (ref 0–44)
AST: 51 IU/L — ABNORMAL HIGH (ref 0–40)
Albumin: 3.6 g/dL (ref 3.5–5.5)
Alkaline Phosphatase: 103 IU/L (ref 39–117)
BUN: 6 mg/dL (ref 6–24)
Bilirubin Total: 0.6 mg/dL (ref 0.0–1.2)
Bilirubin, Direct: 0.27 mg/dL (ref 0.00–0.40)
CHLORIDE: 101 mmol/L (ref 96–106)
CO2: 25 mmol/L (ref 20–29)
CREATININE: 0.94 mg/dL (ref 0.76–1.27)
Calcium: 9.4 mg/dL (ref 8.7–10.2)
GFR calc Af Amer: 110 mL/min/{1.73_m2} (ref >59)
GFR, EST NON AFRICAN AMERICAN: 95 mL/min/{1.73_m2} (ref >59)
Glucose: 147 mg/dL — ABNORMAL HIGH (ref 65–99)
Potassium: 5 mmol/L (ref 3.5–5.2)
Sodium: 140 mmol/L (ref 134–144)
Total Protein: 7 g/dL (ref 6.0–8.5)

## 2018-07-03 LAB — PROTIME-INR
INR: 1.1 (ref 0.8–1.2)
PROTHROMBIN TIME: 11.4 s (ref 9.1–12.0)

## 2018-07-07 ENCOUNTER — Telehealth: Payer: Self-pay | Admitting: Family Medicine

## 2018-07-07 ENCOUNTER — Other Ambulatory Visit: Payer: Self-pay | Admitting: Family Medicine

## 2018-07-07 NOTE — Telephone Encounter (Signed)
Calling to let Dr. Richardson Landry know they have been unable to contact Markian so they are closing the case.  He has 2 open gaps in care, med compliance with beta blocker and RAS antagonist.

## 2018-07-08 NOTE — Progress Notes (Signed)
[name]: your liver numbers are significantly improved from last check! They are almost completely normal. Keep up the great work! We will recheck liver numbers in 3 months. No need for additional blood work right now.

## 2018-07-16 ENCOUNTER — Encounter (HOSPITAL_COMMUNITY): Payer: Self-pay | Admitting: Physical Therapy

## 2018-07-16 NOTE — Therapy (Signed)
[place], [place], [address] Phone: [phone]   Fax:  [phone]  Patient Details  Name: Bradley Miles MRN: [id number] Date of Birth: 09-Jul-1966 Referring Provider:  No ref. provider found  Encounter Date: 07/16/2018   PHYSICAL THERAPY DISCHARGE SUMMARY  Visits from Start of Care: 4  Current functional level related to goals / functional outcomes: Unable to fully report as patient did not continue therapy to have a re-assessment. Patient was still presenting to therapy with pain in his feet. See therapy note from 06/26/18 for more detail regarding patient's progress.    Remaining deficits: Unable to fully report as patient did not continue therapy to have a re-assessment. Patient was still presenting to therapy with pain in his feet. See therapy note from 06/26/18 for more detail regarding patient's progress.    Education / Equipment: Patient had been educated on initial HEP, however further education was limited by patient not returning to physical therapy following his fourth visit.  Plan: Patient agrees to discharge.  Patient goals were not met. Patient is being discharged due to the patient's request.  ?????           Clarene Critchley PT, DPT 4:39 PM, 07/16/18 [place], [place], [address] Phone: [phone]   Fax:  [phone]

## 2018-07-25 ENCOUNTER — Telehealth: Payer: Self-pay | Admitting: Family Medicine

## 2018-07-25 NOTE — Telephone Encounter (Signed)
Patient wanting form updated from his last visit on 06/26/2018 . He states you took him out of work until 8/2 but form has 7/22. We need to update the the date so I can fax back so he can get paid for the time out of work , as soon as possible. Please update and date ,sign so I can fax back Monday if possible . I put in work note from visit on 06/26/18 and it was extended until 8/2. In yellow folder.

## 2018-07-25 NOTE — Telephone Encounter (Signed)
See below please.

## 2018-07-28 NOTE — Telephone Encounter (Signed)
done

## 2018-07-31 ENCOUNTER — Other Ambulatory Visit: Payer: Self-pay | Admitting: Family Medicine

## 2018-08-24 IMAGING — DX DG CHEST 2V
2 series · 3 of 3 positions shown · non-contrast
Comparison: Radiographs January 15, 2017.

CLINICAL DATA: Cough, shortness of breath.

EXAM:
CHEST  2 VIEW

[Series 1: chest pa · 0.14mm/px · 2 of 2 slices shown]
[im 1/2]
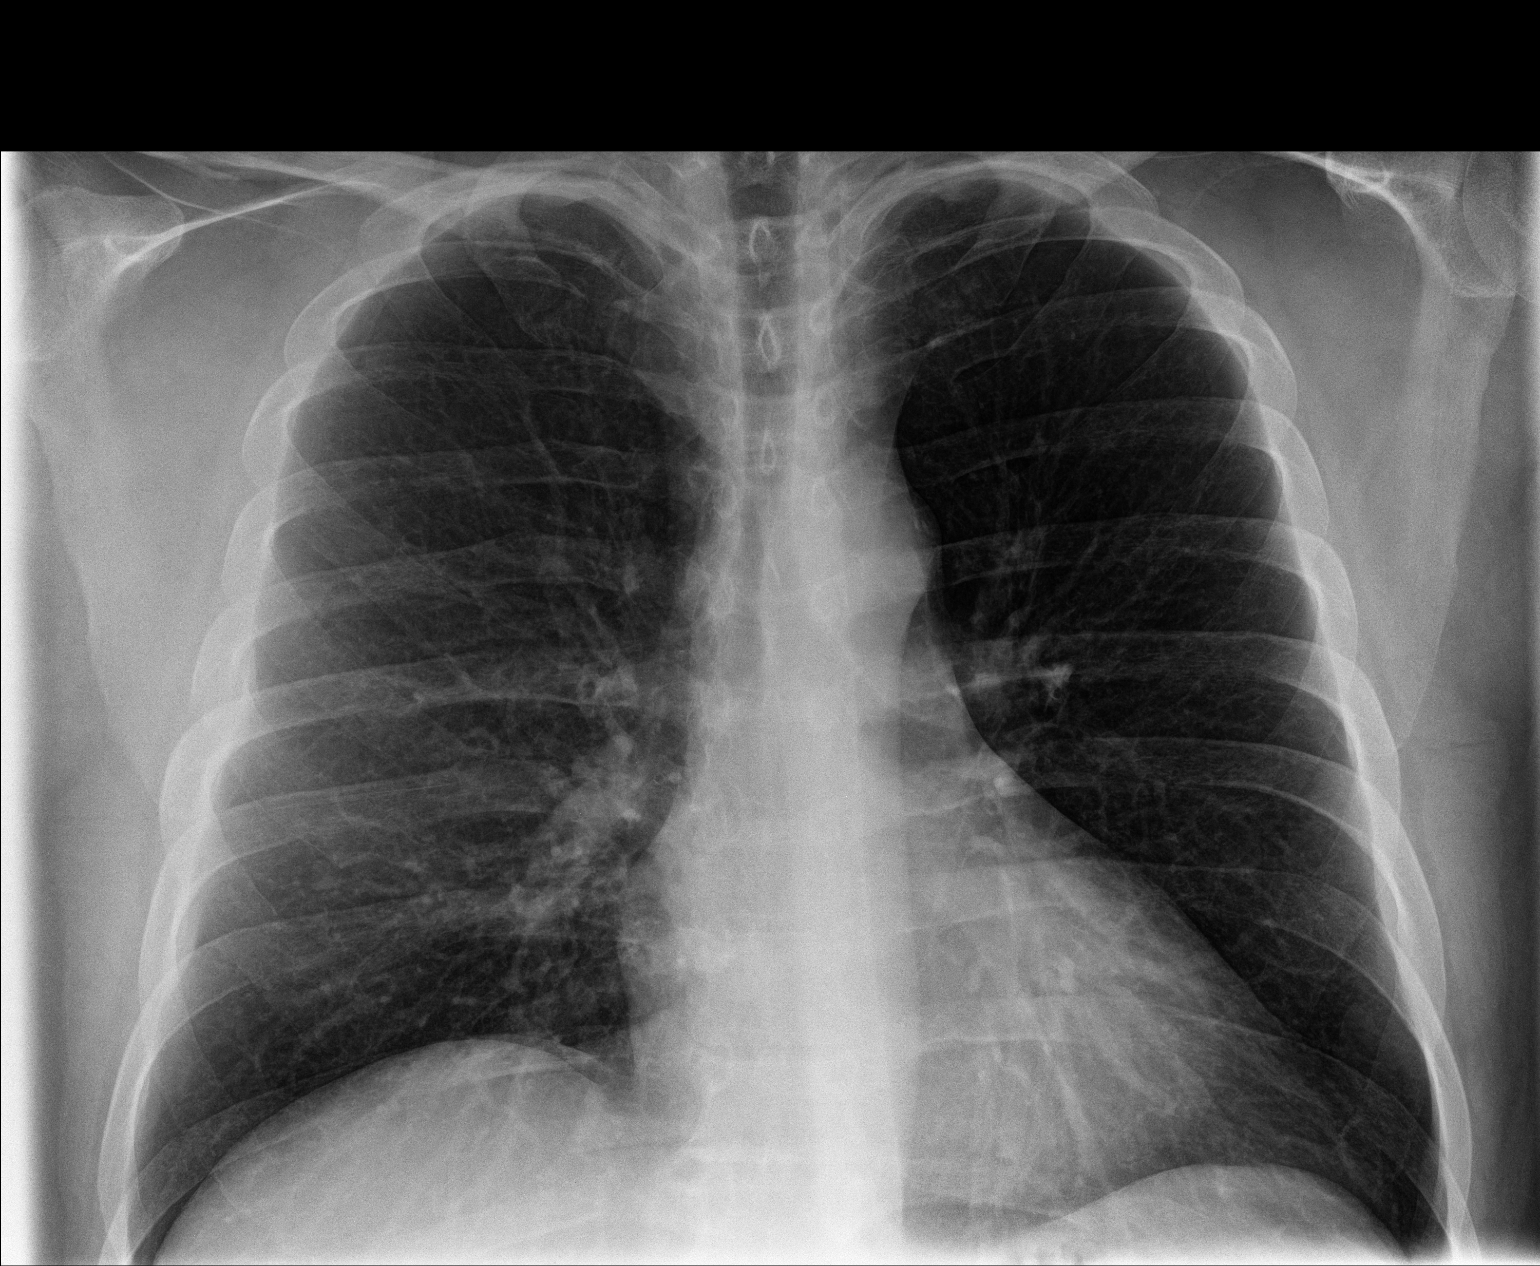
[im 2/2]
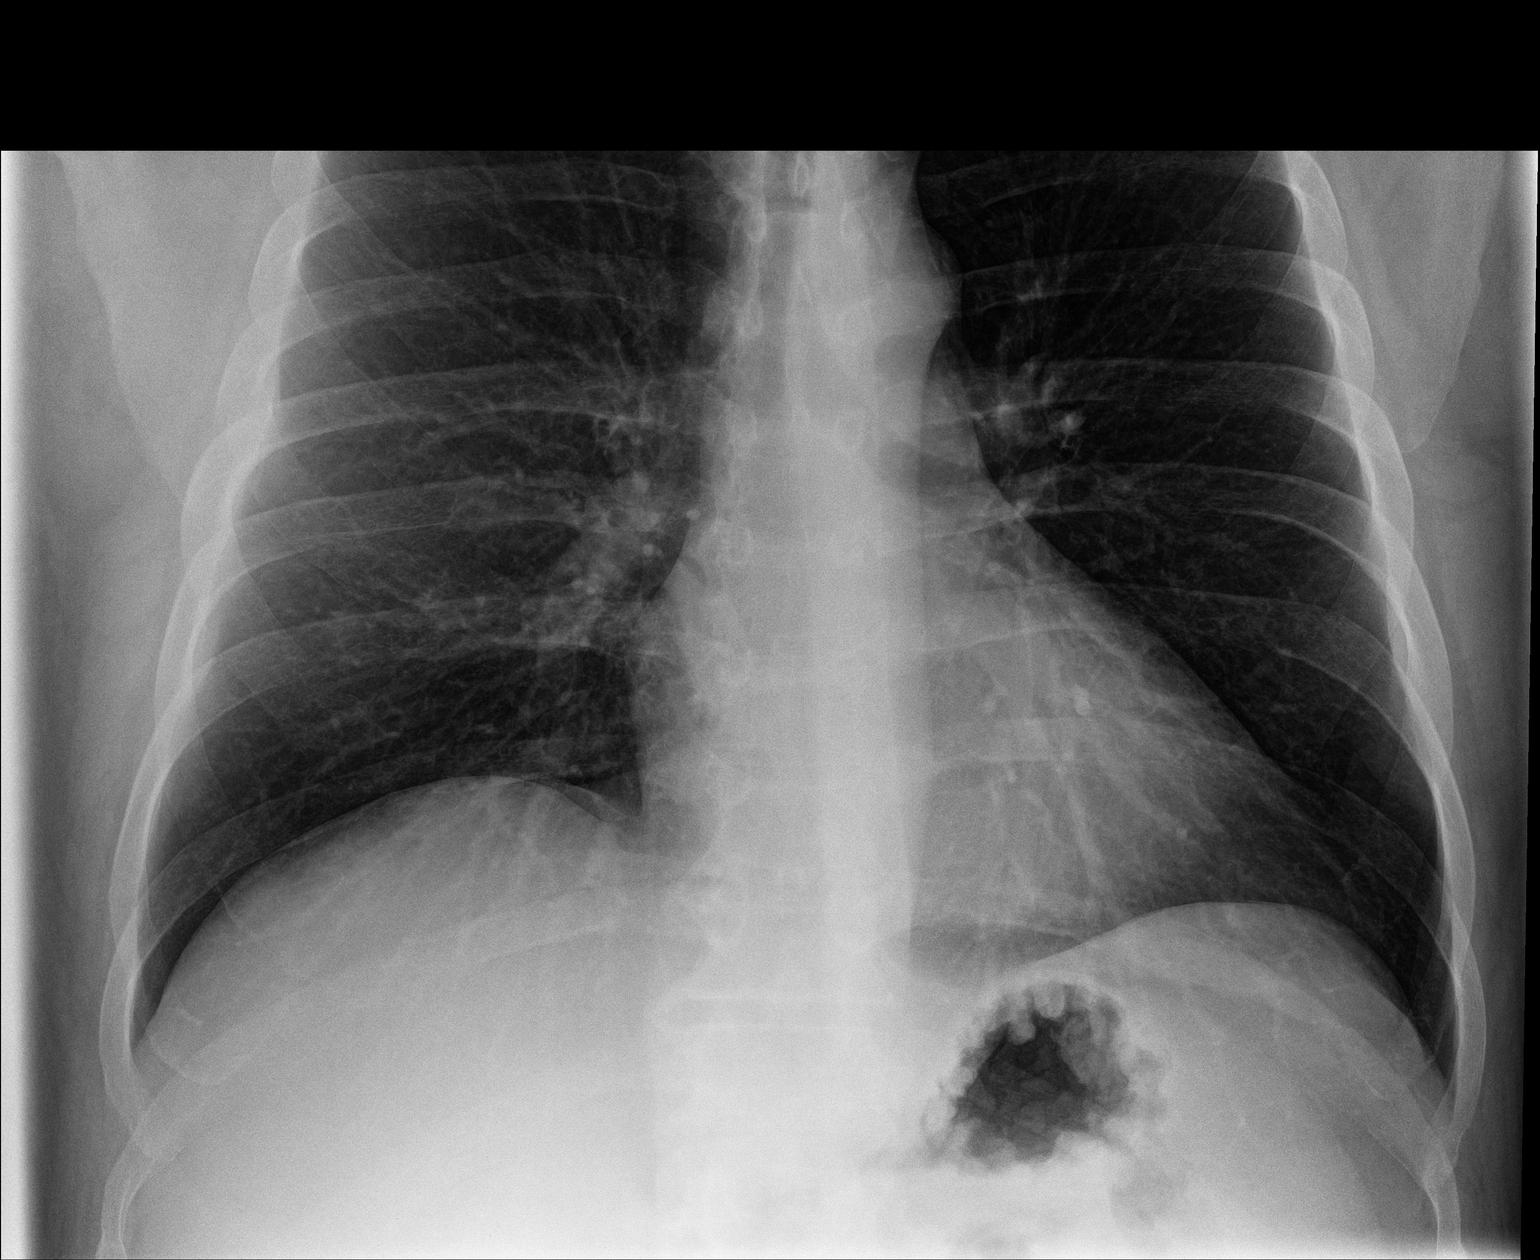

[chest lat]
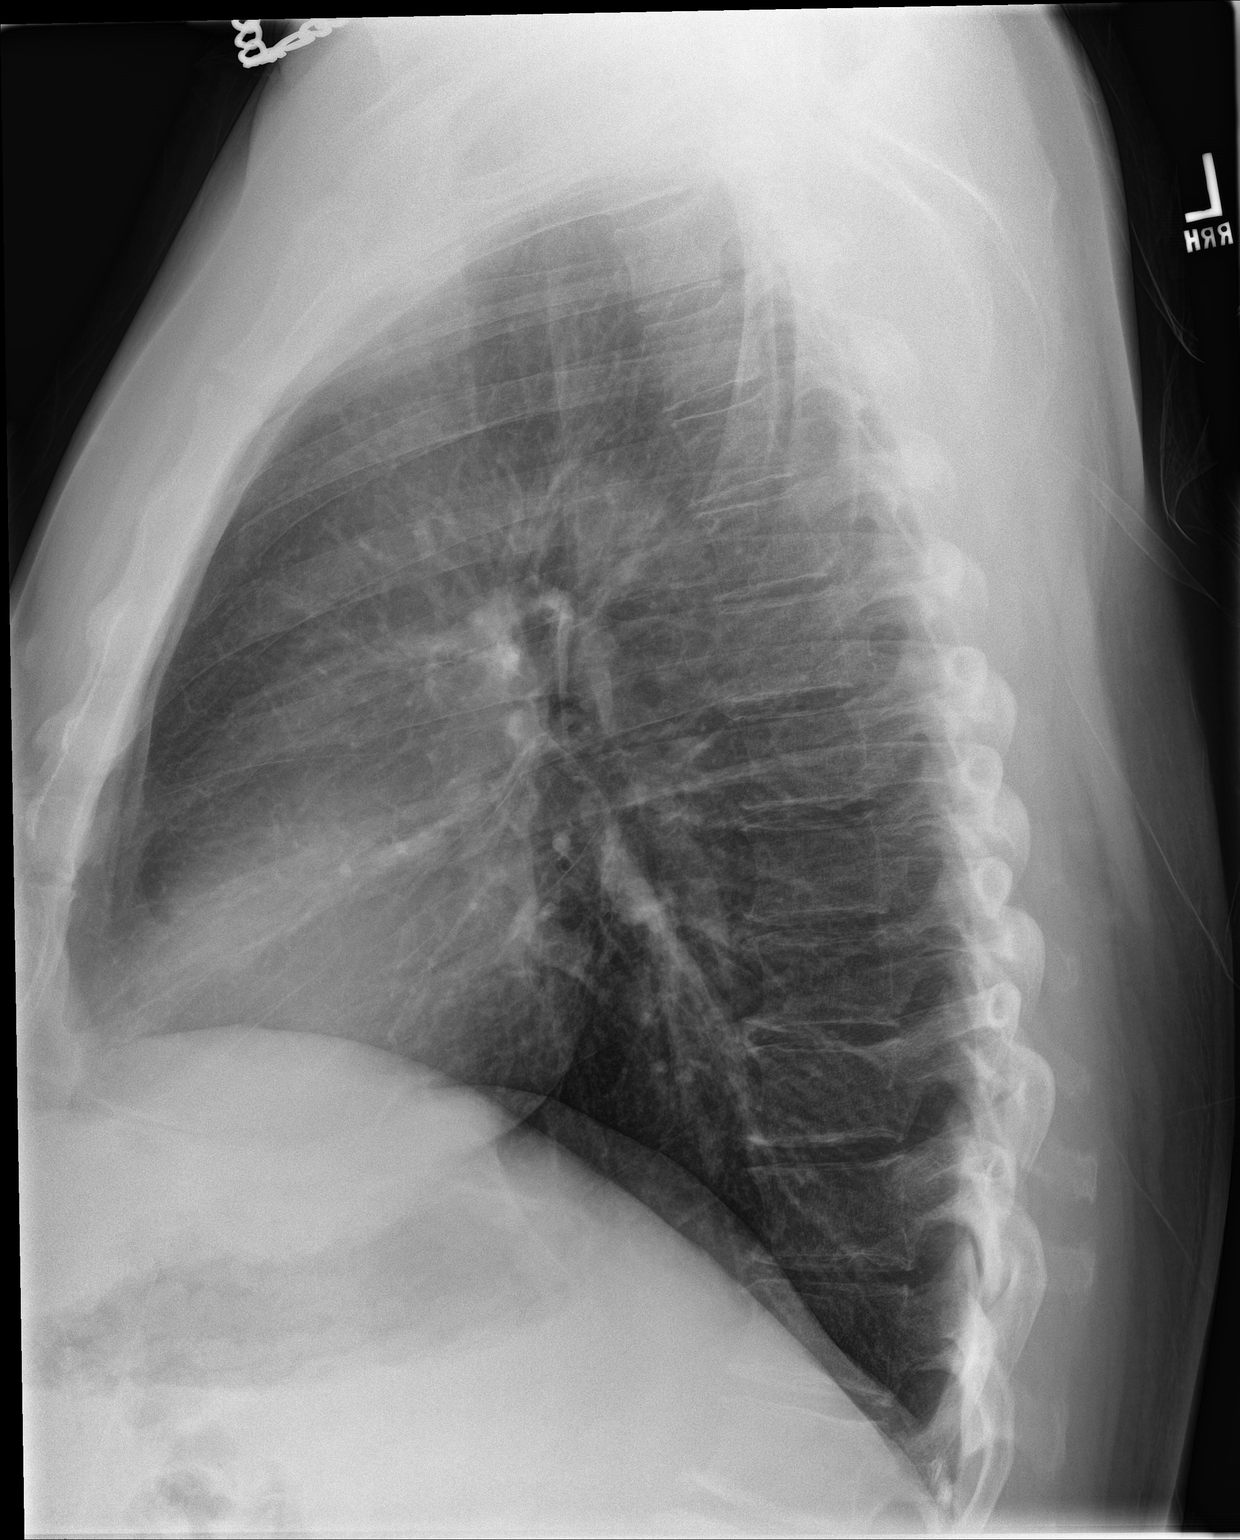

[3 of 3 positions shown; findings below may reference images not displayed]

FINDINGS: The heart size and mediastinal contours are within normal limits.
Both lungs are clear. No pneumothorax or pleural effusion is noted.
The visualized skeletal structures are unremarkable.
IMPRESSION: No active cardiopulmonary disease.

## 2018-08-26 ENCOUNTER — Ambulatory Visit: Payer: Managed Care, Other (non HMO) | Admitting: Nurse Practitioner

## 2018-09-12 ENCOUNTER — Other Ambulatory Visit: Payer: Self-pay | Admitting: Family Medicine

## 2018-09-19 ENCOUNTER — Ambulatory Visit: Payer: Managed Care, Other (non HMO) | Admitting: Family Medicine

## 2018-10-10 ENCOUNTER — Other Ambulatory Visit: Payer: Self-pay | Admitting: Family Medicine

## 2018-10-13 NOTE — Telephone Encounter (Signed)
One mo plus two monthly ref

## 2018-11-22 ENCOUNTER — Other Ambulatory Visit: Payer: Self-pay | Admitting: Family Medicine

## 2018-11-26 ENCOUNTER — Encounter: Payer: Self-pay | Admitting: *Deleted

## 2018-11-26 ENCOUNTER — Encounter

## 2018-11-26 ENCOUNTER — Telehealth: Payer: Self-pay | Admitting: *Deleted

## 2018-11-26 ENCOUNTER — Encounter: Payer: Self-pay | Admitting: Nurse Practitioner

## 2018-11-26 ENCOUNTER — Ambulatory Visit: Payer: Managed Care, Other (non HMO) | Admitting: Nurse Practitioner

## 2018-11-26 ENCOUNTER — Other Ambulatory Visit: Payer: Self-pay | Admitting: *Deleted

## 2018-11-26 VITALS — BP 154/99 | HR 88 | Temp 97.6°F | Ht 73.0 in | Wt 268.8 lb

## 2018-11-26 DIAGNOSIS — K760 Fatty (change of) liver, not elsewhere classified: Secondary | ICD-10-CM

## 2018-11-26 DIAGNOSIS — K766 Portal hypertension: Secondary | ICD-10-CM | POA: Diagnosis not present

## 2018-11-26 DIAGNOSIS — F10231 Alcohol dependence with withdrawal delirium: Secondary | ICD-10-CM

## 2018-11-26 NOTE — Progress Notes (Signed)
Referring Provider: [name], MD Primary Care Physician:  [name], MD Primary GI:  Dr. Gala Romney  Chief Complaint  Patient presents with  . hepatic steatosis    HPI:   Bradley Miles is a 52 y.o. male who presents for follow-up on hepatic steatosis.  The patient was last seen in our office 06/27/2018 for the same.  At that time it was noted he was admitted June 2019 secondary to abdominal pain and confusion found to have elevated LFTs in the setting of alcohol abuse, no indication for prednisolone due to low DF.  Acute kidney injury as well which improved with supportive measures.  Diffuse hepatic steatosis noted on imaging but no splenomegaly.  All hepatitis viral serologies were negative.  Previous HFP completed 06/13/2018 which found a total bilirubin of 2.3 which was an improvement.  Alk phos increased to 154 and transaminases increased from discharge with AST/ALT at 99/53.  Gallstones noted on imaging but without cholecystitis.  Note chronic elevation of transaminases since 2015 which is when the patient states she started drinking.  At his last visit he had discomfort in the right upper quadrant when he takes a deep breath, intermittent discomfort after eating "knows it is there."  Pain increases to a 4 or 5 out of 10 when pressing on it although it is much improved since hospitalization.  No alcohol use since hospitalization.  Recommended continue alcohol cessation, further serologies, follow-up in 3 months.  Further labs were checked 07/02/2018 which found significant improvement in transaminases with AST/ALT at 51/28, total bilirubin declined to 0.6, INR improved to 1.1.  Recommended continue abstinence, follow-up in 3 months to check liver serologies again and update clinical progression.  Today he states he's doing ok overall. He restarted drinking ETOH in 08/2018. Currently drinks 1-2 pints a day. He had some RUQ and epigastric abdominal pain, worse with hurting. Denies  N/V, fever, chills, hematochezia, melena, unintentional weight loss. Denies yellowing of skin/eyes, darkened urine, acute episodic confusion, tremors/shakes. Does have some mild forgetfulness. Denies chest pain, dyspnea, dizziness, lightheadedness, syncope, near syncope. Denies any other upper or lower GI symptoms. Denies chest pain, dyspnea, dizziness, lightheadedness. Denies any other upper or lower GI symptoms.  His wife states she found him passed out in the kitchen; she states he sometimes coughs and gets a vasovagal response.  TCS up to date 2014; due in 10 years (2024).  Past Medical History:  Diagnosis Date  . Anxiety   . Enlarged liver   . GERD (gastroesophageal reflux disease)   . Hypertension   . Palpitations   . PVC's (premature ventricular contractions)   . Shortness of breath   . Varicose veins     Past Surgical History:  Procedure Laterality Date  . COLONOSCOPY WITH ESOPHAGOGASTRODUODENOSCOPY (EGD)  12/16/2012   internal hemorrhoids, colonic diverticulosis, benign polyps, screening in 2024. EGD with mild erosive reflux esophagitis, small hiatal hernia, negative H.pylori  . cyst reomved     from spine  . ESOPHAGOGASTRODUODENOSCOPY  05/19/09   RMR: Geographic distal esophageal erosions consistent with severe erosive reflux esophagitis. schatzki's ring s/P dilation/small hiatal hernia otherwise normal stomach  . WISDOM TOOTH EXTRACTION      Current Outpatient Medications  Medication Sig Dispense Refill  . acebutolol (SECTRAL) 400 MG capsule TAKE 1 CAPSULE BY MOUTH TWICE A DAY 60 capsule 5  . albuterol (PROVENTIL HFA;VENTOLIN HFA) 108 (90 Base) MCG/ACT inhaler Inhale 2 puffs into the lungs every 6 (six) hours as needed  for wheezing or shortness of breath. 1 Inhaler 2  . allopurinol (ZYLOPRIM) 100 MG tablet TAKE 1 TABLET BY MOUTH ONCE DAILY 30 tablet 5  . DEXILANT 60 MG capsule TAKE ONE CAPSULE BY MOUTH ONCE DAILY 30 capsule 2  . FIBER PO Take by mouth daily.    Marland Kitchen  gabapentin (NEURONTIN) 300 MG capsule TAKE ONE CAPSULE BY MOUTH FOUR TIMES DAILY 360 capsule 2  . LORazepam (ATIVAN) 1 MG tablet TAKE 1 TABLET BY MOUTH TWICE DAILY AS NEEDED FOR ANXIETY 60 tablet 2  . Multiple Vitamin (MULTIVITAMIN WITH MINERALS) TABS tablet Take 1 tablet by mouth daily.    . nortriptyline (PAMELOR) 50 MG capsule Take 1 capsule (50 mg total) by mouth at bedtime. 30 capsule 5  . Omega-3 Fatty Acids (FISH OIL PO) Take 3 capsules by mouth daily.    . ranitidine (ZANTAC) 150 MG tablet Take 150 mg by mouth as needed for heartburn.    . sildenafil (VIAGRA) 50 MG tablet Take 50 mg by mouth daily as needed for erectile dysfunction.    . sucralfate (CARAFATE) 1 g tablet TAKE 1 TABLET BY MOUTH BEFORE MEALS 90 tablet 0  . thiamine 100 MG tablet Take 1 tablet (100 mg total) by mouth daily.    . vitamin B-12 (CYANOCOBALAMIN) 1000 MCG tablet Take 1,000 mcg by mouth daily.     No current facility-administered medications for this visit.     Allergies as of 11/26/2018  . (No Known Allergies)    Family History  Problem Relation Age of Onset  . Cancer Mother        ?etiology  . Heart disease Mother   . Colon polyps Sister 20       two sisters  . Liver disease Neg Hx     Social History   Socioeconomic History  . Marital status: Significant Other    Spouse name: Not on file  . Number of children: 2  . Years of education: Not on file  . Highest education level: Not on file  Occupational History  . 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 Financial resource strain: Not on file  . Food insecurity:    Worry: Not on file    Inability: Not on file  . Transportation needs:    Medical: Not on file    Non-medical: Not on file  Tobacco Use  . Smoking status: Current Every Day Smoker    Packs/day: 1.50    Years: 30.00    Pack years: 45.00    Types: Cigarettes    Start date: 12/24/1979  . Smokeless tobacco: Never Used  Substance and  Sexual Activity  . Alcohol use: Yes    Alcohol/week: 0.0 standard drinks    Comment: consumes beer or liquor three to four times weekly. Up to six pack or three shots each occurence or more x 20+ yrs.  . Drug use: No  . Sexual activity: Yes    Partners: Female    Birth control/protection: None  Lifestyle  . Physical activity:    Days per week: Not on file    Minutes per session: Not on file  . Stress: Not on file  Relationships  . Social connections:    Talks on phone: Not on file    Gets together: Not on file    Attends religious service: Not on file    Active member of club or organization: Not on file    Attends  meetings of clubs or organizations: Not on file    Relationship status: Not on file  Other Topics Concern  . Not on file  Social History Narrative   Lives alone  Girlfriend & kids 1/2 time    Review of Systems: General: Negative for anorexia, weight loss, fever, chills, fatigue, weakness. ENT: Negative for hoarseness, difficulty swallowing. CV: Negative for chest pain, angina, palpitations, peripheral edema.  Respiratory: Negative for dyspnea at rest, cough, sputum, wheezing.  GI: See history of present illness. GU:  Negative darkened urine.  Derm: Negative generalized itching.  Neuro: Negative for memory loss, confusion.  Endo: Negative for unusual weight change.  Heme: Negative for bruising or bleeding.   Physical Exam: BP (!) 154/99   Pulse 88   Temp 97.6 F (36.4 C) (Oral)   Ht _0  (1.854 m)   Wt 268 lb 12.8 oz (121.9 kg)   BMI 35.46 kg/m  General:   Alert and oriented. Pleasant and cooperative. Well-nourished and well-developed.  Eyes:  Without icterus, sclera clear and conjunctiva pink.  Ears:  Normal auditory acuity. Cardiovascular:  S1, S2 present without murmurs appreciated. Extremities without clubbing or edema. Respiratory:  Clear to auscultation bilaterally. No wheezes, rales, or rhonchi. No distress.  Gastrointestinal:  +BS, soft, and  non-distended. Mild RUQ/epigastric TTP. Mild hepatomegaly with liver border appreciated 1/2 to 1 fingerbreadth below the right costal margin with inspiration. No splenomegaly noted. No guarding or rebound. No masses appreciated.  Rectal:  Deferred  Musculoskalatal:  Symmetrical without gross deformities. Neurologic:  Alert and oriented x4;  grossly normal neurologically. Psych:  Alert and cooperative. Normal mood and affect. Heme/Lymph/Immune: No excessive bruising noted.    11/26/2018 10:30 AM   Disclaimer: This note was dictated with voice recognition software. Similar sounding words can inadvertently be transcribed and may not be corrected upon review.

## 2018-11-26 NOTE — Progress Notes (Signed)
CC'ED TO PCP 

## 2018-11-26 NOTE — Telephone Encounter (Signed)
Pre-op scheduled for 12/18/18 at 1:15pm. Patient aware. Letter mailed.

## 2018-11-26 NOTE — Assessment & Plan Note (Signed)
The patient has alcohol dependence and likely alcoholism.  He previously gave up alcohol for about 3 months.  He does drink currently 1 to 2 pints of liquor a day which is less than he was when he was hospitalized, although he has relapsed.  We discussed the success/relapse nature of alcoholism.  He is still attending AA.  I recommended he attempt fellowship all again as this seems to work previously.  He does want to quit drinking but states it is difficult.  His girlfriend seems to be quite supportive of him.  I recommended he reach out to any healthcare provider if he needs help with/further resources.  Follow-up in 4 months.  I strongly advised him to continue to attempt alcohol cessation due to the status of his liver and recent alcoholic hepatitis.  We discussed the insidious and uncomfortable progression of liver disease should he continue drinking.  He verbalized understanding.

## 2018-11-26 NOTE — Assessment & Plan Note (Signed)
Consider portal hypertension given thrombocytopenia with a platelet count in the 120s to 130s.  Likely early/mild.  He has not had an EGD in a number of years.  At this point we will schedule an EGD to further evaluate and screen for esophageal varices given his thrombocytopenia.  Further work-up as per below.  Return for follow-up in 4 months.  Proceed with EGD on propofol/MAC with Dr. Gala Romney in near future: the risks, benefits, and alternatives have been discussed with the patient in detail. The patient states understanding and desires to proceed.  The patient is currently on Neurontin, Ativan, oxycodone.  Current alcoholic drinking 1 to 2 pints of liquor a day.  No other anticoagulants, anxiolytics, chronic pain medications, or antidepressants.  Given his histories and medications we will plan for the procedure on propofol/MAC to promote adequate sedation.

## 2018-11-26 NOTE — Patient Instructions (Signed)
1. It is very important you continue your efforts to try to stop drinking alcohol. 2. Consider touching base with [facility].  Continue AA meetings. 3. Have your labs drawn when you are able to. 4. We will help schedule right upper quadrant ultrasound to check for the degree of scarring in your liver. 5. We will schedule your upper endoscopy to look for swollen blood vessels given your ongoing liver disease. 6. Follow-up in 4 months. 7. Call us if you have any questions or concerns.  At [facility] we value your feedback. You may receive a survey about your visit today. Please share your experience as we strive to create trusting relationships with our patients to provide genuine, compassionate, quality care.  We appreciate your understanding and patience as we review any laboratory studies, imaging, and other diagnostic tests that are ordered as we care for you. Our office policy is 5 business days for review of these results, and any emergent or urgent results are addressed in a timely manner for your best interest. If you do not hear from our office in 1 week, please contact us.   We also encourage the use of MyChart, which contains your medical information for your review as well. If you are not enrolled in this feature, an access code is on this after visit summary for your convenience. Thank you for allowing [place] to be involved in your care.  It was great to see you today!  I hope you have a Merry Christmas!!

## 2018-11-26 NOTE — Assessment & Plan Note (Signed)
Patient with hepatic steatosis on imaging.  No definitive cirrhosis on imaging.  However, he does have mild thrombocytopenia with a platelet count in the 120s to 130s suggestive of portal hypertension although splenomegaly not noted on recent abdominal imaging.  We will further work-up to try to discern the degree of fibrosis/cirrhosis.  EGD as per above for variceal screening given thrombocytopenia.  I will recheck his labs including CBC, CMP, INR, AFP.  We will plan for right upper quadrant ultrasound elastography to obtain a Matavir score.  Recommend he continue his attempts at alcohol cessation.  I offered assistance and resources as needed.  He is still attending AA.  Follow-up in 4 months.

## 2018-12-05 ENCOUNTER — Ambulatory Visit (HOSPITAL_COMMUNITY)
Admission: RE | Admit: 2018-12-05 | Discharge: 2018-12-05 | Disposition: A | Discharge status: Home / Self Care | Payer: Managed Care, Other (non HMO) | Source: Ambulatory Visit | Attending: Nurse Practitioner | Admitting: Nurse Practitioner

## 2018-12-05 DIAGNOSIS — K8020 Calculus of gallbladder without cholecystitis without obstruction: Secondary | ICD-10-CM | POA: Insufficient documentation

## 2018-12-05 DIAGNOSIS — K766 Portal hypertension: Secondary | ICD-10-CM | POA: Insufficient documentation

## 2018-12-05 DIAGNOSIS — K760 Fatty (change of) liver, not elsewhere classified: Secondary | ICD-10-CM | POA: Insufficient documentation

## 2018-12-05 DIAGNOSIS — F10231 Alcohol dependence with withdrawal delirium: Secondary | ICD-10-CM

## 2018-12-08 LAB — COMPREHENSIVE METABOLIC PANEL
AG RATIO: 1.2 (calc) (ref 1.0–2.5)
ALKALINE PHOSPHATASE (APISO): 93 U/L (ref 40–115)
ALT: 38 U/L (ref 9–46)
AST: 65 U/L — ABNORMAL HIGH (ref 10–35)
Albumin: 3.9 g/dL (ref 3.6–5.1)
BILIRUBIN TOTAL: 1.3 mg/dL — AB (ref 0.2–1.2)
BUN: 8 mg/dL (ref 7–25)
CALCIUM: 9.5 mg/dL (ref 8.6–10.3)
CO2: 30 mmol/L (ref 20–32)
Chloride: 99 mmol/L (ref 98–110)
Creat: 0.92 mg/dL (ref 0.70–1.33)
Globulin: 3.3 g/dL (calc) (ref 1.9–3.7)
Glucose, Bld: 127 mg/dL (ref 65–139)
Potassium: 4.2 mmol/L (ref 3.5–5.3)
Sodium: 139 mmol/L (ref 135–146)
Total Protein: 7.2 g/dL (ref 6.1–8.1)

## 2018-12-08 LAB — CBC WITH DIFFERENTIAL/PLATELET
ABSOLUTE MONOCYTES: 428 {cells}/uL (ref 200–950)
BASOS ABS: 18 {cells}/uL (ref 0–200)
Basophils Relative: 0.4 %
EOS ABS: 189 {cells}/uL (ref 15–500)
Eosinophils Relative: 4.1 %
HEMATOCRIT: 49.3 % (ref 38.5–50.0)
HEMOGLOBIN: 17.2 g/dL — AB (ref 13.2–17.1)
LYMPHS ABS: 869 {cells}/uL (ref 850–3900)
MCH: 35 pg — ABNORMAL HIGH (ref 27.0–33.0)
MCHC: 34.9 g/dL (ref 32.0–36.0)
MCV: 100.2 fL — AB (ref 80.0–100.0)
MPV: 11.7 fL (ref 7.5–12.5)
Monocytes Relative: 9.3 %
NEUTROS PCT: 67.3 %
Neutro Abs: 3096 cells/uL (ref 1500–7800)
Platelets: 92 10*3/uL — ABNORMAL LOW (ref 140–400)
RBC: 4.92 10*6/uL (ref 4.20–5.80)
RDW: 13.4 % (ref 11.0–15.0)
Total Lymphocyte: 18.9 %
WBC: 4.6 10*3/uL (ref 3.8–10.8)

## 2018-12-08 LAB — PROTIME-INR
INR: 1.1
Prothrombin Time: 11.7 s — ABNORMAL HIGH (ref 9.0–11.5)

## 2018-12-08 LAB — AFP TUMOR MARKER: AFP-Tumor Marker: 6.7 ng/mL — ABNORMAL HIGH (ref <6.1)

## 2018-12-15 ENCOUNTER — Telehealth: Payer: Self-pay | Admitting: *Deleted

## 2018-12-15 NOTE — Telephone Encounter (Signed)
Spoke with patient and he needs to r/s procedure that is on 12/25/2018. He is now scheduled for 02/02/2019 at 7:30am. Patient aware will call back with new pre-op appt. New instructions mailed.   Pre-op scheduled for 01/29/2019 at 9:00am. LMOVM. Letter also mailed.

## 2018-12-15 NOTE — Telephone Encounter (Signed)
Received VM from patient stating he needs to r/s his procedure on 12/25/2018. Called patient LMTCB

## 2018-12-18 ENCOUNTER — Inpatient Hospital Stay (HOSPITAL_COMMUNITY): Admission: RE | Admit: 2018-12-18 | Payer: Managed Care, Other (non HMO) | Source: Ambulatory Visit

## 2019-01-01 ENCOUNTER — Other Ambulatory Visit: Payer: Self-pay | Admitting: Family Medicine

## 2019-01-02 NOTE — Telephone Encounter (Signed)
Ok threee mo

## 2019-01-29 ENCOUNTER — Encounter (HOSPITAL_COMMUNITY)
Admission: RE | Admit: 2019-01-29 | Discharge: 2019-01-29 | Disposition: A | Discharge status: Home / Self Care | Payer: Managed Care, Other (non HMO) | Source: Ambulatory Visit | Attending: Internal Medicine | Admitting: Internal Medicine

## 2019-02-02 ENCOUNTER — Encounter (HOSPITAL_COMMUNITY): Payer: Self-pay | Admitting: *Deleted

## 2019-02-02 ENCOUNTER — Ambulatory Visit (HOSPITAL_COMMUNITY): Payer: Managed Care, Other (non HMO) | Admitting: Anesthesiology

## 2019-02-02 ENCOUNTER — Ambulatory Visit (HOSPITAL_COMMUNITY)
Admission: RE | Admit: 2019-02-02 | Discharge: 2019-02-02 | Disposition: A | Discharge status: Home / Self Care | Payer: Managed Care, Other (non HMO) | Attending: Internal Medicine | Admitting: Internal Medicine

## 2019-02-02 ENCOUNTER — Encounter (HOSPITAL_COMMUNITY)
Admission: RE | Disposition: A | Discharge status: Home / Self Care | Payer: Self-pay | Source: Home / Self Care | Attending: Internal Medicine

## 2019-02-02 DIAGNOSIS — I10 Essential (primary) hypertension: Secondary | ICD-10-CM | POA: Diagnosis not present

## 2019-02-02 DIAGNOSIS — F419 Anxiety disorder, unspecified: Secondary | ICD-10-CM | POA: Diagnosis not present

## 2019-02-02 DIAGNOSIS — K2950 Unspecified chronic gastritis without bleeding: Secondary | ICD-10-CM | POA: Diagnosis not present

## 2019-02-02 DIAGNOSIS — Z79899 Other long term (current) drug therapy: Secondary | ICD-10-CM | POA: Diagnosis not present

## 2019-02-02 DIAGNOSIS — K219 Gastro-esophageal reflux disease without esophagitis: Secondary | ICD-10-CM | POA: Diagnosis not present

## 2019-02-02 DIAGNOSIS — K766 Portal hypertension: Secondary | ICD-10-CM | POA: Diagnosis not present

## 2019-02-02 DIAGNOSIS — K760 Fatty (change of) liver, not elsewhere classified: Secondary | ICD-10-CM

## 2019-02-02 DIAGNOSIS — K3189 Other diseases of stomach and duodenum: Secondary | ICD-10-CM | POA: Diagnosis not present

## 2019-02-02 DIAGNOSIS — R1011 Right upper quadrant pain: Secondary | ICD-10-CM | POA: Diagnosis not present

## 2019-02-02 HISTORY — PX: BIOPSY: SHX5522

## 2019-02-02 HISTORY — PX: ESOPHAGOGASTRODUODENOSCOPY (EGD) WITH PROPOFOL: SHX5813

## 2019-02-02 SURGERY — ESOPHAGOGASTRODUODENOSCOPY (EGD) WITH PROPOFOL
Anesthesia: Monitor Anesthesia Care

## 2019-02-02 MED ORDER — GLYCOPYRROLATE 0.2 MG/ML IJ SOLN
INTRAMUSCULAR | Status: DC | PRN
  Administered 2019-02-02: 0.2 mg via INTRAVENOUS

## 2019-02-02 MED ORDER — PROPOFOL 10 MG/ML IV BOLUS
INTRAVENOUS | Status: DC | PRN
  Administered 2019-02-02: 20 mg via INTRAVENOUS
  Administered 2019-02-02: 80 mg via INTRAVENOUS

## 2019-02-02 MED ORDER — LACTATED RINGERS IV SOLN
INTRAVENOUS | Status: DC
  Administered 2019-02-02: 1000 mL via INTRAVENOUS

## 2019-02-02 MED ORDER — PROPOFOL 500 MG/50ML IV EMUL
INTRAVENOUS | Status: DC | PRN
  Administered 2019-02-02: 200 ug/kg/min via INTRAVENOUS

## 2019-02-02 MED ORDER — CHLORHEXIDINE GLUCONATE CLOTH 2 % EX PADS: 6.0000 | MEDICATED_PAD | Freq: Once | CUTANEOUS | Status: DC

## 2019-02-02 NOTE — H&P (Signed)
@LOGO @   Primary Care Physician:  [name], MD Primary Gastroenterologist:  Dr. Gala Romney  Pre-Procedure History & Physical: HPI:  Bradley Miles is a 53 y.o. male here for screening for esophageal varices.  History of right upper quadrant abdominal pain.  Denies dysphagia.  Past Medical History:  Diagnosis Date  . Anxiety   . Enlarged liver   . GERD (gastroesophageal reflux disease)   . Hypertension   . Palpitations   . PVC's (premature ventricular contractions)   . Shortness of breath   . Varicose veins     Past Surgical History:  Procedure Laterality Date  . COLONOSCOPY WITH ESOPHAGOGASTRODUODENOSCOPY (EGD)  12/16/2012   internal hemorrhoids, colonic diverticulosis, benign polyps, screening in 2024. EGD with mild erosive reflux esophagitis, small hiatal hernia, negative H.pylori  . cyst reomved     from spine  . ESOPHAGOGASTRODUODENOSCOPY  05/19/09   RMR: Geographic distal esophageal erosions consistent with severe erosive reflux esophagitis. schatzki's ring s/P dilation/small hiatal hernia otherwise normal stomach  . WISDOM TOOTH EXTRACTION      Prior to Admission medications   Medication Sig Start Date End Date Taking? Authorizing Provider  acebutolol (SECTRAL) 400 MG capsule TAKE 1 CAPSULE BY MOUTH TWICE A DAY Patient taking differently: Take 400 mg by mouth 2 (two) times daily.  03/31/18  Yes [name], MD  albuterol (PROVENTIL HFA;VENTOLIN HFA) 108 (90 Base) MCG/ACT inhaler Inhale 2 puffs into the lungs every 6 (six) hours as needed for wheezing or shortness of breath. 05/14/17  Yes [name], MD  allopurinol (ZYLOPRIM) 100 MG tablet TAKE 1 TABLET BY MOUTH ONCE DAILY Patient taking differently: Take 100 mg by mouth daily.  11/24/18  Yes [name], MD  DEXILANT 60 MG capsule TAKE ONE CAPSULE BY MOUTH ONCE DAILY 01/02/19  Yes [name], MD  FIBER PO Take 1 capsule by mouth daily.    Yes [provider]  gabapentin (NEURONTIN)  300 MG capsule TAKE ONE CAPSULE BY MOUTH FOUR TIMES DAILY Patient taking differently: Take 300 mg by mouth 4 (four) times daily.  03/31/18  Yes [name], MD  LORazepam (ATIVAN) 1 MG tablet TAKE 1 TABLET BY MOUTH TWICE DAILY AS NEEDED FOR ANXIETY Patient taking differently: Take 1 mg by mouth 2 (two) times daily as needed for anxiety.  10/13/18  Yes [name], MD  nortriptyline (PAMELOR) 50 MG capsule Take 1 capsule (50 mg total) by mouth at bedtime. 06/26/18  Yes [name], MD  Omega-3 Fatty Acids (FISH OIL PO) Take 3 capsules by mouth daily.   Yes [provider]  Potassium 99 MG TABS Take 99 mg by mouth daily.   Yes [provider]  ranitidine (ZANTAC) 150 MG tablet Take 150 mg by mouth daily as needed for heartburn.   Yes [provider]  sildenafil (VIAGRA) 100 MG tablet TAKE 1/2 TABLET ONCE DAILY 01/02/19  Yes [name], MD  sildenafil (VIAGRA) 50 MG tablet Take 50 mg by mouth daily as needed for erectile dysfunction.   Yes [provider]  sucralfate (CARAFATE) 1 g tablet TAKE 1 TABLET BY MOUTH BEFORE MEALS 01/02/19  Yes [name], MD  thiamine 100 MG tablet Take 1 tablet (100 mg total) by mouth daily. 06/10/18  Yes Isaac Bliss, Rayford Halsted, MD  vitamin B-12 (CYANOCOBALAMIN) 1000 MCG tablet Take 1,000 mcg by mouth daily.   Yes [provider]  Multiple Vitamin (MULTIVITAMIN WITH MINERALS) TABS tablet  Take 1 tablet by mouth daily. Patient not taking: Reported on 12/12/2018 06/10/18   Isaac Bliss, Rayford Halsted, MD    Allergies as of 11/26/2018  . (No Known Allergies)    Family History  Problem Relation Age of Onset  . Cancer Mother        ?etiology  . Heart disease Mother   . Colon polyps Sister 76       two sisters  . Liver disease Neg Hx     Social History   Socioeconomic History  . Marital status: Significant Other    Spouse name: Not on file  . Number of children: 2  . Years of education: Not on  file  . Highest education level: Not on file  Occupational History  . 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 Financial resource strain: Not on file  . Food insecurity:    Worry: Not on file    Inability: Not on file  . Transportation needs:    Medical: Not on file    Non-medical: Not on file  Tobacco Use  . Smoking status: Current Every Day Smoker    Packs/day: 1.50    Years: 30.00    Pack years: 45.00    Types: Cigarettes    Start date: 12/24/1979  . Smokeless tobacco: Never Used  Substance and Sexual Activity  . Alcohol use: Yes    Alcohol/week: 0.0 standard drinks    Comment: consumes beer or liquor three to four times weekly. Up to six pack or three shots each occurence or more x 20+ yrs.  . Drug use: No  . Sexual activity: Yes    Partners: Female    Birth control/protection: None  Lifestyle  . Physical activity:    Days per week: Not on file    Minutes per session: Not on file  . Stress: Not on file  Relationships  . Social connections:    Talks on phone: Not on file    Gets together: Not on file    Attends religious service: Not on file    Active member of club or organization: Not on file    Attends meetings of clubs or organizations: Not on file    Relationship status: Not on file  . Intimate partner violence:    Fear of current or ex partner: Not on file    Emotionally abused: Not on file    Physically abused: Not on file    Forced sexual activity: Not on file  Other Topics Concern  . Not on file  Social History Narrative   Lives alone  Girlfriend & kids 1/2 time    Review of Systems: See HPI, otherwise negative ROS  Physical Exam: BP (!) 142/90   Temp 98.1 F (36.7 C) (Oral)   Resp 16   Ht 6\' 1"  (1.854 m)   Wt 120.2 kg   SpO2 92%   BMI 34.96 kg/m  General:   Alert,  Well-developed, well-nourished, pleasant and cooperative in NAD Skin:  Intact without significant lesions or rashes. Neck:  Supple; no  masses or thyromegaly. No significant cervical adenopathy. Lungs:  Clear throughout to auscultation.   No wheezes, crackles, or rhonchi. No acute distress. Heart:  Regular rate and rhythm; no murmurs, clicks, rubs,  or gallops. Abdomen: Non-distended, normal bowel sounds.  Soft and nontender without appreciable mass or hepatosplenomegaly.  Pulses:  Normal pulses noted. Extremities:  Without clubbing or edema.  Impression/Plan: Pleasant gentleman  with steatohepatitis hepatomegaly thrombocytopenia.  Here for screening for esophageal varices.  History of right upper quadrant abdominal pain not much of a problem at this time.  Denies dysphagia.  The risks, benefits, limitations, alternatives and imponderables have been reviewed with the patient. Potential for esophageal dilation, biopsy, etc. have also been reviewed.  Questions have been answered. All parties agreeable.   Notice: This dictation was prepared with Dragon dictation along with smaller phrase technology. Any transcriptional errors that result from this process are unintentional and may not be corrected upon review.

## 2019-02-02 NOTE — Anesthesia Procedure Notes (Signed)
Procedure Name: MAC Date/Time: 02/02/2019 7:32 AM Performed by: Vista Deck, CRNA Pre-anesthesia Checklist: Patient identified, Emergency Drugs available, Suction available, Timeout performed and Patient being monitored Patient Re-evaluated:Patient Re-evaluated prior to induction Oxygen Delivery Method: Non-rebreather mask

## 2019-02-02 NOTE — Anesthesia Preprocedure Evaluation (Signed)
Anesthesia Evaluation  Patient identified by MRN, date of birth, ID band Patient awake    Reviewed: Allergy & Precautions, H&P , NPO status , Patient's Chart, lab work & pertinent test results, reviewed documented beta blocker date and time   Airway Mallampati: II  TM Distance: >3 FB Neck ROM: full   Comment: Thick neck Dental no notable dental hx.    Pulmonary shortness of breath, COPD, Current Smoker,    Pulmonary exam normal breath sounds clear to auscultation       Cardiovascular Exercise Tolerance: Good hypertension, negative cardio ROS   Rhythm:regular Rate:Normal     Neuro/Psych  Headaches, PSYCHIATRIC DISORDERS Anxiety ETOH dependence Neuromuscular disease    GI/Hepatic GERD  ,(+) Hepatitis -  Endo/Other  negative endocrine ROS  Renal/GU negative Renal ROS  negative genitourinary   Musculoskeletal   Abdominal   Peds  Hematology negative hematology ROS (+)   Anesthesia Other Findings   Reproductive/Obstetrics negative OB ROS                             Anesthesia Physical Anesthesia Plan  ASA: III  Anesthesia Plan: MAC   Post-op Pain Management:    Induction:   PONV Risk Score and Plan:   Airway Management Planned:   Additional Equipment:   Intra-op Plan:   Post-operative Plan:   Informed Consent: I have reviewed the patients History and Physical, chart, labs and discussed the procedure including the risks, benefits and alternatives for the proposed anesthesia with the patient or authorized representative who has indicated his/her understanding and acceptance.     Dental Advisory Given  Plan Discussed with: CRNA  Anesthesia Plan Comments:         Anesthesia Quick Evaluation

## 2019-02-02 NOTE — Transfer of Care (Signed)
Immediate Anesthesia Transfer of Care Note  Patient: Bradley Miles  Procedure(s) Performed: ESOPHAGOGASTRODUODENOSCOPY (EGD) WITH PROPOFOL (N/A ) BIOPSY  Patient Location: Short Stay  Anesthesia Type:MAC  Level of Consciousness: awake, alert  and patient cooperative  Airway & Oxygen Therapy: Patient Spontanous Breathing  Post-op Assessment: Report given to RN and Post -op Vital signs reviewed and stable  Post vital signs: Reviewed and stable  Last Vitals:  Vitals Value Taken Time  BP    Temp    Pulse    Resp    SpO2      Last Pain:  Vitals:   02/02/19 0735  TempSrc:   PainSc: 0-No pain      Patients Stated Pain Goal: 9 (89/16/94 5038)  Complications: No apparent anesthesia complications

## 2019-02-02 NOTE — Discharge Instructions (Signed)

## 2019-02-02 NOTE — Op Note (Signed)
[facility] Patient Name: Bradley Miles Procedure Date: 02/02/2019 7:10 AM MRN: [id number] Date of Birth: May 15, 1966 Attending MD: Norvel Richards , MD CSN: [id number] Age: 53 Admit Type: Outpatient Procedure:                Upper GI endoscopy Indications:              Screening procedure, Abdominal pain in the right                            upper quadrant Providers:                Norvel Richards, MD, Otis Peak B. Sharon Seller, RN,                            Aram Candela Referring MD:              Medicines:                Propofol per Anesthesia Complications:            No immediate complications. Estimated Blood Loss:     Estimated blood loss was minimal. Procedure:                Pre-Anesthesia Assessment:                           - Prior to the procedure, a History and Physical                            was performed, and patient medications and                            allergies were reviewed. The patient's tolerance of                            previous anesthesia was also reviewed. The risks                            and benefits of the procedure and the sedation                            options and risks were discussed with the patient.                            All questions were answered, and informed consent                            was obtained. Prior Anticoagulants: The patient has                            taken no previous anticoagulant or antiplatelet                            agents. ASA Grade Assessment: II - A patient with  mild systemic disease. After reviewing the risks                            and benefits, the patient was deemed in                            satisfactory condition to undergo the procedure.                           After obtaining informed consent, the endoscope was                            passed under direct vision. Throughout the                            procedure, the patient's blood  pressure, pulse, and                            oxygen saturations were monitored continuously. The                            GIF-H190 ([id number]) scope was introduced through the                            and advanced to the second part of duodenum. The                            upper GI endoscopy was accomplished without                            difficulty. The patient tolerated the procedure                            well. Scope In: 7:42:13 AM Scope Out: 7:46:10 AM Total Procedure Duration: 0 hours 3 minutes 57 seconds  Findings:      The examined esophagus was normal.      Moderate portal hypertensive gastropathy was found in the entire       examined stomach. Retained gastric contents made it difficult to see all       of the mucosa (last meal 10 hours prior to this procedure). No ulcer or       infiltrating process. This was biopsied with a cold forceps for       histology. Estimated blood loss was minimal.      The duodenal bulb and second portion of the duodenum were normal. Impression:               - Normal esophagus.                           - Portal hypertensive gastropathy. Retained gastric                            contents.                           -  Normal duodenal bulb and second portion of the                            duodenum.                           - No specimens collected. Moderate Sedation:      Moderate (conscious) sedation was personally administered by an       anesthesia professional. The following parameters were monitored: oxygen       saturation, heart rate, blood pressure, and response to care. Recommendation:           - Written discharge instructions were provided to                            the patient.                           - The signs and symptoms of potential delayed                            complications were discussed with the patient.                           - Patient has a contact number available for                             emergencies.                           - Return to normal activities tomorrow. Continued                            alcohol abstinence. Aerobic exercise. Patient ought                            to lose 50 pounds in the next 12 months.                            Intermittent right upper quadrant abdominal pain.                            Known cholelithiasis. Further evaluation may be                            warranted.                           - Advance diet as tolerated.                           - Continue present medications.                           - Await pathology results.                           -  Repeat upper endoscopy in 2 years for                            surveillance.                           - Return to GI clinic (date not yet determined). Procedure Code(s):        --- Professional ---                           [phone], Esophagogastroduodenoscopy, flexible,                            transoral; with biopsy, single or multiple Diagnosis Code(s):        --- Professional ---                           K76.6, Portal hypertension                           K31.89, Other diseases of stomach and duodenum                           Z13.810, Encounter for screening for upper                            gastrointestinal disorder                           R10.11, Right upper quadrant pain CPT copyright 2018 American Medical Association. All rights reserved. The codes documented in this report are preliminary and upon coder review may  be revised to meet current compliance requirements. Bradley Miles. Bradley Sangiovanni, MD Norvel Richards, MD 02/02/2019 8:11:33 AM This report has been signed electronically. Number of Addenda: 0

## 2019-02-02 NOTE — Anesthesia Postprocedure Evaluation (Signed)
Anesthesia Post Note  Patient: Bradley Miles  Procedure(s) Performed: ESOPHAGOGASTRODUODENOSCOPY (EGD) WITH PROPOFOL (N/A ) BIOPSY  Patient location during evaluation: Short Stay Anesthesia Type: MAC Level of consciousness: awake and alert and patient cooperative Pain management: satisfactory to patient Vital Signs Assessment: post-procedure vital signs reviewed and stable Cardiovascular status: stable Postop Assessment: no apparent nausea or vomiting Anesthetic complications: no Comments: Glasses with patient.     Last Vitals:  Vitals:   02/02/19 0643 02/02/19 0757  BP: (!) 142/90 (!) 91/59  Pulse:  97  Resp: 16 16  Temp: 36.7 C 36.7 C  SpO2: 92% 94%    Last Pain:  Vitals:   02/02/19 0757  TempSrc: Oral  PainSc: 0-No pain                 Merrissa Giacobbe

## 2019-02-04 ENCOUNTER — Encounter (HOSPITAL_COMMUNITY): Payer: Self-pay | Admitting: Internal Medicine

## 2019-02-07 ENCOUNTER — Other Ambulatory Visit: Payer: Self-pay | Admitting: Family Medicine

## 2019-02-07 ENCOUNTER — Encounter: Payer: Self-pay | Admitting: Internal Medicine

## 2019-02-20 ENCOUNTER — Encounter: Payer: Self-pay | Admitting: Family Medicine

## 2019-02-20 ENCOUNTER — Ambulatory Visit: Payer: Managed Care, Other (non HMO) | Admitting: Family Medicine

## 2019-02-20 ENCOUNTER — Other Ambulatory Visit: Payer: Self-pay

## 2019-02-20 VITALS — BP 122/74 | HR 101 | Temp 97.9°F | Ht 73.0 in | Wt 270.0 lb

## 2019-02-20 DIAGNOSIS — R55 Syncope and collapse: Secondary | ICD-10-CM

## 2019-02-20 DIAGNOSIS — I739 Peripheral vascular disease, unspecified: Secondary | ICD-10-CM | POA: Diagnosis not present

## 2019-02-20 DIAGNOSIS — J449 Chronic obstructive pulmonary disease, unspecified: Secondary | ICD-10-CM | POA: Diagnosis not present

## 2019-02-20 DIAGNOSIS — G629 Polyneuropathy, unspecified: Secondary | ICD-10-CM | POA: Diagnosis not present

## 2019-02-20 DIAGNOSIS — R7303 Prediabetes: Secondary | ICD-10-CM

## 2019-02-20 MED ORDER — PREDNISONE 20 MG PO TABS: ORAL_TABLET | ORAL | 0 refills | Status: DC

## 2019-02-20 MED ORDER — CEFDINIR 300 MG PO CAPS: 300.0000 mg | ORAL_CAPSULE | Freq: Two times a day (BID) | ORAL | 0 refills | Status: DC

## 2019-02-20 MED ORDER — ALBUTEROL SULFATE (2.5 MG/3ML) 0.083% IN NEBU: 2.5000 mg | INHALATION_SOLUTION | Freq: Four times a day (QID) | RESPIRATORY_TRACT | 5 refills | Status: DC | PRN

## 2019-02-20 NOTE — Progress Notes (Signed)
   Subjective:    Patient ID: Bradley Miles, male    DOB: 04-15-1966, 53 y.o.   MRN: [id number]  Shortness of Breath  This is a new problem. Episode onset: 3 days. Associated symptoms comments: Low grade fever, cough, congestion, sob. Treatments tried: albuterol inhaler, warm mist vaporizer. The treatment provided no relief.  passed out when coughing 2 nights ago.  mon night and tue  Coughing up phlegm  Dark color  Passed out with coughing  Had some low gr fever, tmax 99.9  By eve took meds and was 99.7  Lot of oughing  Sob at times   Review of Systems  Respiratory: Positive for shortness of breath.        Objective:   Physical Exam  Alert and oriented, vitals reviewed and stable, NAD ENT-TM's and ext canals WNL bilat via otoscopic exam Soft palate, tonsils and post pharynx WNL via oropharyngeal exam Neck-symmetric, no masses; thyroid nonpalpable and nontender Pulmonary-no tachypnea or accessory muscle positive substantial wheezes via auscultation Card--no abnrml murmurs, rhythm reg and rate WNL Carotid pulses symmetric, without bruits       Assessment & Plan:  Impression 1 post cough syncope.  Patient had actually video captured on security camera outside the work.  Sitting in chair coughed hard and suddenly collapsed.  This occurred several days ago.  Patient wishes no further work-up neurologically stable at this time so we will hold off  2.  Exacerbation of asthma/COPD with substantial wheezing will treat with steroids antibiotics and albuterol encouraged to stop smoking  3.  Chronic alcohol abuse with liver disease followed by GI specialist

## 2019-03-21 ENCOUNTER — Other Ambulatory Visit: Payer: Self-pay | Admitting: Family Medicine

## 2019-03-23 NOTE — Telephone Encounter (Signed)
Six mo ok 

## 2019-03-25 ENCOUNTER — Ambulatory Visit: Payer: Managed Care, Other (non HMO) | Admitting: Nurse Practitioner

## 2019-04-22 ENCOUNTER — Other Ambulatory Visit: Payer: Self-pay | Admitting: Family Medicine

## 2019-04-23 ENCOUNTER — Other Ambulatory Visit: Payer: Self-pay | Admitting: Family Medicine

## 2019-04-24 NOTE — Telephone Encounter (Signed)
Six mo worth 

## 2019-05-18 ENCOUNTER — Ambulatory Visit: Payer: Managed Care, Other (non HMO) | Admitting: Cardiovascular Disease

## 2019-05-28 ENCOUNTER — Other Ambulatory Visit: Payer: Self-pay | Admitting: Family Medicine

## 2019-06-03 ENCOUNTER — Other Ambulatory Visit: Payer: Self-pay

## 2019-06-03 ENCOUNTER — Ambulatory Visit: Payer: Managed Care, Other (non HMO) | Admitting: Nurse Practitioner

## 2019-06-03 ENCOUNTER — Encounter: Payer: Self-pay | Admitting: Nurse Practitioner

## 2019-06-03 VITALS — BP 161/97 | HR 80 | Temp 98.0°F | Ht 73.0 in | Wt 265.2 lb

## 2019-06-03 DIAGNOSIS — K760 Fatty (change of) liver, not elsewhere classified: Secondary | ICD-10-CM

## 2019-06-03 DIAGNOSIS — F10231 Alcohol dependence with withdrawal delirium: Secondary | ICD-10-CM

## 2019-06-03 DIAGNOSIS — R600 Localized edema: Secondary | ICD-10-CM | POA: Diagnosis not present

## 2019-06-03 DIAGNOSIS — K219 Gastro-esophageal reflux disease without esophagitis: Secondary | ICD-10-CM | POA: Diagnosis not present

## 2019-06-03 NOTE — Patient Instructions (Addendum)
Your health issues we discussed today were:   GERD (reflux/heartburn): 1. Continue taking Dexilant once a day 2. Continue your other acid blocker medication 3. Avoid trigger foods it can make your symptoms worse 4. Alcohol can cause worsening reflux type symptoms.  We will always encourage you to reduce and eliminate alcohol 5. Call us if you have any worsening or severe symptoms  Liver disease and ankle swelling with ongoing alcohol consumption: 1. As we discussed when you are drinking a lot of alcohol your appetite is decreased 2. Make sure you are consuming a balanced nutritious diet 3. We can consider adding Ensure, boost, or other protein supplements 4. Have your labs completed when you are able to 5. Discuss your swelling goals with your primary care 6. Call us if you have any worsening or severe symptoms.  Overall I recommend:  1. Continue your other current medications 2. Return for follow-up in 6 months 3. Call us if you have any questions or concerns   Because of recent events of COVID-19 ("Coronavirus"), follow CDC recommendations:  1. Wash your hand frequently 2. Avoid touching your face 3. Stay away from people who are sick 4. If you have symptoms such as fever, cough, shortness of breath then call your healthcare provider for further guidance 5. If you are sick, STAY AT HOME unless otherwise directed by your healthcare provider. 6. Follow directions from state and national officials regarding staying safe   At [facility] we value your feedback. You may receive a survey about your visit today. Please share your experience as we strive to create trusting relationships with our patients to provide genuine, compassionate, quality care.  We appreciate your understanding and patience as we review any laboratory studies, imaging, and other diagnostic tests that are ordered as we care for you. Our office policy is 5 business days for review of these results,  and any emergent or urgent results are addressed in a timely manner for your best interest. If you do not hear from our office in 1 week, please contact us.   We also encourage the use of MyChart, which contains your medical information for your review as well. If you are not enrolled in this feature, an access code is on this after visit summary for your convenience. Thank you for allowing [place] to be involved in your care.  It was great to see you today!  I hope you have a great summer!!

## 2019-06-03 NOTE — Progress Notes (Signed)
Referring Provider: [name], MD Primary Care Physician:  [name], MD Primary GI:  Dr. Gala Romney  Chief Complaint  Patient presents with  . Follow-up  . Gastroesophageal Reflux    TAKING DEXILANT 60 MG QAM & ZANTAC PRN. Pt feels his gerd flares up at times  . Leg Swelling    swelling in feet & legs.     HPI:   Bradley Miles is a 53 y.o. male who presents for 69-month postprocedure follow-up, follow-up on alcohol dependence with hepatic steatosis and portal hypertension.  Patient was last seen in our office 11/26/2018 for hepatic steatosis, portal hypertension, alcohol dependence.  Previous admission in July 2019 for abdominal pain and confusion with elevated LFTs in the setting of alcohol abuse but did not meet discriminant function threshold for prednisolone.  Diffuse hepatic steatosis on imaging but no splenomegaly.  Gallstones on imaging but without acute cholecystitis.  Ongoing and intermittent transaminitis since 2015 when the patient started drinking.  After discharge his LFTs improved significantly to AST/ALT of 51/28, bilirubin 0.6, INR 1.1 in the setting of alcohol abstinence.  At his last visit he noted he restarted drinking alcohol in September 2019 and drinks 1 or 2 pints a day.  Some epigastric and right upper quadrant discomfort.  Some mild forgetfulness but denies overt hepatic and GI symptoms.  Colonoscopy up-to-date 2014 and next due in 2024.  Recommended alcohol abstinence, touch base with outpatient rehab, routine labs, right upper quadrant ultrasound, EGD for variceal screening, follow-up in 4 months.  Liver labs completed 12/05/2018 which found mildly worsening thrombocytopenia with platelet count of 92, stable hemoglobin.  CMP found some mild elevation in AST/ALT at 65/38, bilirubin has elevated to 1.3, normal kidney function.  INR stable at 1.1.  AFP mildly elevated at 6.7 (no previous baseline).  Right upper quadrant ultrasound completed 12/05/2018  found cholelithiasis without acute cholecystitis, diffuse fatty infiltration but no focal hepatic lesions.  Elastography found Matavir score of F0/F1 indicating minimal/mild fibrosis.  Given elevated AFP and low sensitivity/specificity of right upper quadrant ultrasound for [place] screening without normal AFP we recommended an MRI of the liver which the patient declined.  EGD completed 02/02/2019 which found normal esophagus, portal hypertensive gastropathy status post biopsy, retained gastric contents, normal duodenum.  Surgical pathology found the biopsies to be mild chronic inactive gastritis.  Recommended repeat EGD in 2 years (2022).  Today he states he has been having GERD flares despite Dexilant daily and Zantac prn. Flares occur rarely and are minimal per his description. Still drinking, at least 2 pints a day. States he's been to rehab twice and can't afford it again. Hasn't had any ETOH in 3 days. Is trying to keep with AA (online only currently) and "get back on the horse" after he relapses. Has been having bilateral LE edema. Appetite when he drinks is minimal; when not drinking his appetite is great. Has some abdominal discomfort when he eats a lot and can't poop. Has up to 3 bowel movements a day but they're really tiny, stools are hard and require straining. When he does have a "good bowel movement" his abdominal discomfort improves. Has intermittent nausea in the morning but no significant vomiting; sometimes has dry heaves, feels it's due to the drinking. Denies hematochezia, melena, fever, chills, unintentional weight loss. Denies URI or flu-like symptoms. Denies loss of sense of taste or smell. Denies chest pain, dyspnea, dizziness, lightheadedness, syncope, near syncope. Denies any other upper or lower GI  symptoms.  Past Medical History:  Diagnosis Date  . Anxiety   . Enlarged liver   . GERD (gastroesophageal reflux disease)   . Hypertension   . Palpitations   . PVC's (premature  ventricular contractions)   . Shortness of breath   . Varicose veins     Past Surgical History:  Procedure Laterality Date  . BIOPSY  02/02/2019   Procedure: BIOPSY;  Surgeon: Daneil Dolin, MD;  Location: [facility];  Service: Endoscopy;;  gastric  . COLONOSCOPY WITH ESOPHAGOGASTRODUODENOSCOPY (EGD)  12/16/2012   internal hemorrhoids, colonic diverticulosis, benign polyps, screening in 2024. EGD with mild erosive reflux esophagitis, small hiatal hernia, negative H.pylori  . cyst reomved     from spine  . ESOPHAGOGASTRODUODENOSCOPY  05/19/09   RMR: Geographic distal esophageal erosions consistent with severe erosive reflux esophagitis. schatzki's ring s/P dilation/small hiatal hernia otherwise normal stomach  . ESOPHAGOGASTRODUODENOSCOPY (EGD) WITH PROPOFOL N/A 02/02/2019   Procedure: ESOPHAGOGASTRODUODENOSCOPY (EGD) WITH PROPOFOL;  Surgeon: Daneil Dolin, MD;  Location: [facility];  Service: Endoscopy;  Laterality: N/A;  2:45pm  . WISDOM TOOTH EXTRACTION      Current Outpatient Medications  Medication Sig Dispense Refill  . acebutolol (SECTRAL) 400 MG capsule TAKE 1 CAPSULE BY MOUTH TWICE A DAY 60 capsule 5  . albuterol (PROVENTIL HFA;VENTOLIN HFA) 108 (90 Base) MCG/ACT inhaler Inhale 2 puffs into the lungs every 6 (six) hours as needed for wheezing or shortness of breath. 1 Inhaler 2  . albuterol (PROVENTIL) (2.5 MG/3ML) 0.083% nebulizer solution Take 3 mLs (2.5 mg total) by nebulization every 6 (six) hours as needed for wheezing or shortness of breath. 150 mL 5  . allopurinol (ZYLOPRIM) 100 MG tablet TAKE 1 TABLET BY MOUTH ONCE DAILY (Patient taking differently: Take 100 mg by mouth daily. ) 30 tablet 5  . DEXILANT 60 MG capsule TAKE ONE CAPSULE BY MOUTH ONCE DAILY 30 capsule 3  . FIBER PO Take 1 capsule by mouth daily.     Marland Kitchen gabapentin (NEURONTIN) 300 MG capsule TAKE ONE CAPSULE BY MOUTH FOUR TIMES DAILY 360 capsule 0  . LORazepam (ATIVAN) 1 MG tablet TAKE 1 TABLET BY MOUTH  TWICE DAILY AS NEEDED FOR ANXIETY 60 tablet 5  . nortriptyline (PAMELOR) 50 MG capsule TAKE 1 CAPSULE(50 MG) BY MOUTH AT BEDTIME 30 capsule 5  . Omega-3 Fatty Acids (FISH OIL PO) Take 3 capsules by mouth daily.    . ondansetron (ZOFRAN-ODT) 4 MG disintegrating tablet DISSOLVE 1 TABLET ON THE TONGUE EVERY 8 HOURS AS NEEDED FOR NAUSEA 24 tablet 0  . Potassium 99 MG TABS Take 99 mg by mouth daily.    . ranitidine (ZANTAC) 150 MG tablet Take 150 mg by mouth daily as needed for heartburn.    . sildenafil (VIAGRA) 100 MG tablet TAKE 1/2 TABLET ONCE DAILY 3 tablet 3  . sildenafil (VIAGRA) 50 MG tablet Take 50 mg by mouth daily as needed for erectile dysfunction.    . sucralfate (CARAFATE) 1 g tablet TAKE 1 TABLET BY MOUTH BEFORE MEALS 90 tablet 3  . thiamine 100 MG tablet Take 1 tablet (100 mg total) by mouth daily.    . traZODone (DESYREL) 50 MG tablet TAKE 1 TABLET BY MOUTH AT BEDTIME AS NEEDED FOR INSOMNIA 30 tablet 5  . vitamin B-12 (CYANOCOBALAMIN) 1000 MCG tablet Take 1,000 mcg by mouth daily.    . predniSONE (DELTASONE) 20 MG tablet Take 3 qd for 3 days, then 2qd for 3 days, then one  qd for 3 days. (Patient not taking: Reported on 06/03/2019) 18 tablet 0   No current facility-administered medications for this visit.     Allergies as of 06/03/2019  . (No Known Allergies)    Family History  Problem Relation Age of Onset  . Cancer Mother        ?etiology  . Heart disease Mother   . Colon polyps Sister 35       two sisters  . Liver disease Neg Hx     Social History   Socioeconomic History  . Marital status: Significant Other    Spouse name: Not on file  . Number of children: 2  . Years of education: Not on file  . Highest education level: Not on file  Occupational History  . 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 Financial resource strain: Not on file  . Food insecurity    Worry: Not on file    Inability: Not on file  .  Transportation needs    Medical: Not on file    Non-medical: Not on file  Tobacco Use  . Smoking status: Current Every Day Smoker    Packs/day: 1.50    Years: 30.00    Pack years: 45.00    Types: Cigarettes    Start date: 12/24/1979  . Smokeless tobacco: Never Used  Substance and Sexual Activity  . Alcohol use: Yes    Alcohol/week: 0.0 standard drinks    Comment: consumes beer or liquor three to four times weekly. Up to six pack or three shots each occurence or more x 20+ yrs.  . Drug use: No  . Sexual activity: Yes    Partners: Female    Birth control/protection: None  Lifestyle  . Physical activity    Days per week: Not on file    Minutes per session: Not on file  . Stress: Not on file  Relationships  . Social Herbalist on phone: Not on file    Gets together: Not on file    Attends religious service: Not on file    Active member of club or organization: Not on file    Attends meetings of clubs or organizations: Not on file    Relationship status: Not on file  Other Topics Concern  . Not on file  Social History Narrative   Lives alone  Girlfriend & kids 1/2 time    Review of Systems: General: Negative for anorexia, weight loss, fever, chills, fatigue, weakness. ENT: Negative for hoarseness, difficulty swallowing. CV: Negative for chest pain, angina, palpitations, peripheral edema.  Respiratory: Negative for dyspnea at rest, cough, sputum, wheezing.  GI: See history of present illness. Derm: Negative for rash or itching.  Neuro: Negative for memory loss, confusion.  Endo: Negative for unusual weight change.  Heme: Negative for bruising or bleeding. Allergy: Negative for rash or hives.   Physical Exam: BP (!) 161/97   Pulse 80   Temp 98 F (36.7 C) (Oral)   Ht 6\' 1"  (1.854 m)   Wt 265 lb 3.2 oz (120.3 kg)   BMI 34.99 kg/m  General:   Alert and oriented. Pleasant and cooperative. Well-nourished and well-developed.  Head:  Normocephalic and  atraumatic. Eyes:  Without icterus, sclera clear and conjunctiva pink.  Ears:  Normal auditory acuity. Cardiovascular:  S1, S2 present without murmurs appreciated. Extremities without clubbing. Noted bilateral 1+ pitting edema with ruddy-type discoloration. Respiratory:  Clear to auscultation bilaterally.  No wheezes, rales, or rhonchi. No distress.  Gastrointestinal:  +BS, rounded but soft, non-tender and non-distended. No HSM noted. No guarding or rebound. No masses appreciated.  Rectal:  Deferred  Musculoskalatal:  Symmetrical without gross deformities. Neurologic:  Alert and oriented x4;  grossly normal neurologically. Psych:  Alert and cooperative. Normal mood and affect. Heme/Lymph/Immune: No excessive bruising noted.    06/03/2019 1:50 PM   Disclaimer: This note was dictated with voice recognition software. Similar sounding words can inadvertently be transcribed and may not be corrected upon review.

## 2019-06-03 NOTE — Assessment & Plan Note (Signed)
Alcohol dependence/alcoholism.  He had stopped drinking for a while but began drinking again prior to his last office visit.  He is currently drinking "at least 2 pints of liquor a day."  This equates to about a fifth of liquor a day.  Continue to encourage him to attend AA meetings online (no life meetings due to COVID-19/coronavirus pandemic).  Recommend he continue to try for alcohol reduction and cessation.  Encouraged nutritious diet.  He does have a decreased appetite when he is drinking and will possibly eat only once a day.  This is likely resulting in some worsening edema.  Previously his albumin level was low normal at 3.9.  Recommended he trial protein supplement or other supplements such as Ensure or boost.  Follow-up in 6 months.

## 2019-06-03 NOTE — Assessment & Plan Note (Signed)
Bilateral lower extremity edema with decreased appetite due to alcoholism and likely inadequate protein intake.  Last albumin when he was not drinking was 3.9.  No recent albumin.  We will recheck his labs today.  Likely protein malnutrition at play causing worsening bilateral lower extremity edema.  Recommend he follow-up with primary care.  Had a nutrition supplement such as Ensure, boost, or protein supplement.  Continue to encourage alcohol reduction and cessation.  Follow-up in 6 months.

## 2019-06-03 NOTE — Assessment & Plan Note (Signed)
GERD generally doing well.  Has rare, mild breakthrough.  Likely exacerbated by continued alcohol consumption.  Recommend he continue his PPI and avoid triggers.  Alcohol cessation as well.  Follow-up in 6 months.

## 2019-06-03 NOTE — Assessment & Plan Note (Signed)
Patient with known hepatic steatosis and chronic alcoholism.  Matavir score F0/F1.  Technically no cirrhosis but given that he continues to drink (currently "at least 2 pints a day") I feel it is best to continue to see him regularly at least every 6 months.  At this point I will recheck his liver labs including CBC, CMP, INR.  No AFP due to previously declined MRI due to mildly elevated AFP and this is generally not a covered lab unless she has documented cirrhosis.  Call for any worsening or concerning symptoms.  Follow-up in 6 months otherwise.

## 2019-06-03 NOTE — Progress Notes (Signed)
cc'ed to pcp °

## 2019-06-04 LAB — COMPREHENSIVE METABOLIC PANEL
AG Ratio: 0.9 (calc) — ABNORMAL LOW (ref 1.0–2.5)
ALT: 24 U/L (ref 9–46)
AST: 92 U/L — ABNORMAL HIGH (ref 10–35)
Albumin: 2.9 g/dL — ABNORMAL LOW (ref 3.6–5.1)
Alkaline phosphatase (APISO): 184 U/L — ABNORMAL HIGH (ref 35–144)
BUN/Creatinine Ratio: 9 (calc) (ref 6–22)
BUN: 6 mg/dL — ABNORMAL LOW (ref 7–25)
CO2: 29 mmol/L (ref 20–32)
Calcium: 8.4 mg/dL — ABNORMAL LOW (ref 8.6–10.3)
Chloride: 102 mmol/L (ref 98–110)
Creat: 0.69 mg/dL — ABNORMAL LOW (ref 0.70–1.33)
Globulin: 3.1 g/dL (calc) (ref 1.9–3.7)
Glucose, Bld: 120 mg/dL (ref 65–139)
Potassium: 4.2 mmol/L (ref 3.5–5.3)
Sodium: 139 mmol/L (ref 135–146)
Total Bilirubin: 5.4 mg/dL — ABNORMAL HIGH (ref 0.2–1.2)
Total Protein: 6 g/dL — ABNORMAL LOW (ref 6.1–8.1)

## 2019-06-04 LAB — CBC WITH DIFFERENTIAL/PLATELET
Absolute Monocytes: 608 cells/uL (ref 200–950)
Basophils Absolute: 49 cells/uL (ref 0–200)
Basophils Relative: 1.3 %
Eosinophils Absolute: 118 cells/uL (ref 15–500)
Eosinophils Relative: 3.1 %
HCT: 37.2 % — ABNORMAL LOW (ref 38.5–50.0)
Hemoglobin: 13.5 g/dL (ref 13.2–17.1)
Lymphs Abs: 897 cells/uL (ref 850–3900)
MCH: 38 pg — ABNORMAL HIGH (ref 27.0–33.0)
MCHC: 36.3 g/dL — ABNORMAL HIGH (ref 32.0–36.0)
MCV: 104.8 fL — ABNORMAL HIGH (ref 80.0–100.0)
MPV: 11.6 fL (ref 7.5–12.5)
Monocytes Relative: 16 %
Neutro Abs: 2128 cells/uL (ref 1500–7800)
Neutrophils Relative %: 56 %
RBC: 3.55 10*6/uL — ABNORMAL LOW (ref 4.20–5.80)
RDW: 12.5 % (ref 11.0–15.0)
Total Lymphocyte: 23.6 %
WBC: 3.8 10*3/uL (ref 3.8–10.8)

## 2019-06-04 LAB — PROTIME-INR
INR: 1.2 — ABNORMAL HIGH
Prothrombin Time: 12 s — ABNORMAL HIGH (ref 9.0–11.5)

## 2019-06-09 ENCOUNTER — Telehealth: Payer: Self-pay

## 2019-06-09 NOTE — Telephone Encounter (Signed)
Received a fax from [facility], they were unable to report platelets due to significant  Platelet clumping.  Platelet estimate appears normal.   Forwarding to Walden Field, NP.

## 2019-06-10 ENCOUNTER — Telehealth: Payer: Self-pay | Admitting: Internal Medicine

## 2019-06-10 NOTE — Telephone Encounter (Signed)
Pt is inquiring about lab results

## 2019-06-10 NOTE — Telephone Encounter (Signed)
Patient called wanting to know the results of his labs, I told him the nurse would call if they were ready

## 2019-06-15 ENCOUNTER — Other Ambulatory Visit: Payer: Self-pay | Admitting: Nurse Practitioner

## 2019-06-15 DIAGNOSIS — K7031 Alcoholic cirrhosis of liver with ascites: Secondary | ICD-10-CM

## 2019-06-17 ENCOUNTER — Other Ambulatory Visit: Payer: Self-pay

## 2019-06-17 ENCOUNTER — Other Ambulatory Visit: Payer: Self-pay | Admitting: Family Medicine

## 2019-06-17 DIAGNOSIS — K7031 Alcoholic cirrhosis of liver with ascites: Secondary | ICD-10-CM

## 2019-06-17 NOTE — Telephone Encounter (Signed)
Noted  

## 2019-06-19 NOTE — Progress Notes (Signed)
Mailed lab orders

## 2019-07-11 ENCOUNTER — Other Ambulatory Visit: Payer: Self-pay | Admitting: Family Medicine

## 2019-07-14 ENCOUNTER — Telehealth: Payer: Self-pay | Admitting: Family Medicine

## 2019-07-14 NOTE — Telephone Encounter (Signed)
Wife called and wanted to have an "Ammonia" check added to his lab order that is already in the system because she said it had been elevated when he was in the hospital and she wants it rechecked.

## 2019-07-14 NOTE — Telephone Encounter (Signed)
Left message to return call. (wife is not of DPR)

## 2019-07-14 NOTE — Telephone Encounter (Signed)
His g I docs are managing this and monitoring his cirrhosis and complications from it, they are the ones who need to order it

## 2019-07-14 NOTE — Telephone Encounter (Signed)
Please advise. Thank you

## 2019-07-15 ENCOUNTER — Telehealth: Payer: Self-pay | Admitting: Internal Medicine

## 2019-07-15 ENCOUNTER — Telehealth: Payer: Self-pay

## 2019-07-15 ENCOUNTER — Other Ambulatory Visit: Payer: Self-pay

## 2019-07-15 DIAGNOSIS — Z20822 Contact with and (suspected) exposure to covid-19: Secondary | ICD-10-CM

## 2019-07-15 DIAGNOSIS — K7031 Alcoholic cirrhosis of liver with ascites: Secondary | ICD-10-CM

## 2019-07-15 NOTE — Telephone Encounter (Signed)
EG, pt would like to add an ammonia level to his labs. Per EG, an ammonia level isn't needed at this time.

## 2019-07-15 NOTE — Telephone Encounter (Signed)
Pt insisted that he has an ammonia level drawn since he wasn't feeling, ok per EG to add ammonia level. Ordered faxed to [facility] lab.

## 2019-07-15 NOTE — Telephone Encounter (Signed)
lab

## 2019-07-15 NOTE — Telephone Encounter (Signed)
[phone] please call patient, his girlfriend called and said they were going to the lab but wanted [place] to add things to his order, like an ammonia level

## 2019-07-16 LAB — NOVEL CORONAVIRUS, NAA: SARS-CoV-2, NAA: NOT DETECTED

## 2019-07-16 LAB — HEPATIC FUNCTION PANEL
AG Ratio: 0.8 (calc) — ABNORMAL LOW (ref 1.0–2.5)
ALT: 36 U/L (ref 9–46)
AST: 148 U/L — ABNORMAL HIGH (ref 10–35)
Albumin: 3.2 g/dL — ABNORMAL LOW (ref 3.6–5.1)
Alkaline phosphatase (APISO): 319 U/L — ABNORMAL HIGH (ref 35–144)
Bilirubin, Direct: 1.2 mg/dL — ABNORMAL HIGH (ref 0.0–0.2)
Globulin: 4 g/dL (calc) — ABNORMAL HIGH (ref 1.9–3.7)
Indirect Bilirubin: 1.7 mg/dL (calc) — ABNORMAL HIGH (ref 0.2–1.2)
Total Bilirubin: 2.9 mg/dL — ABNORMAL HIGH (ref 0.2–1.2)
Total Protein: 7.2 g/dL (ref 6.1–8.1)

## 2019-07-16 LAB — AMMONIA: Ammonia: 183 umol/L — ABNORMAL HIGH (ref <=72)

## 2019-07-16 NOTE — Telephone Encounter (Signed)
Patient has message to GI today requesting ammonia level added to labs

## 2019-07-17 ENCOUNTER — Encounter: Payer: Self-pay | Admitting: Family Medicine

## 2019-07-17 ENCOUNTER — Other Ambulatory Visit: Payer: Self-pay

## 2019-07-17 ENCOUNTER — Ambulatory Visit (INDEPENDENT_AMBULATORY_CARE_PROVIDER_SITE_OTHER): Payer: Managed Care, Other (non HMO) | Admitting: Family Medicine

## 2019-07-17 ENCOUNTER — Telehealth: Payer: Self-pay | Admitting: Nurse Practitioner

## 2019-07-17 DIAGNOSIS — R7989 Other specified abnormal findings of blood chemistry: Secondary | ICD-10-CM

## 2019-07-17 DIAGNOSIS — K7031 Alcoholic cirrhosis of liver with ascites: Secondary | ICD-10-CM

## 2019-07-17 MED ORDER — LACTULOSE ENCEPHALOPATHY 10 GM/15ML PO SOLN: 10.0000 g | Freq: Three times a day (TID) | ORAL | 0 refills | Status: DC

## 2019-07-17 NOTE — Progress Notes (Signed)
   Subjective:  Audio plus video  Patient ID: Bradley Miles, male    DOB: [birth year], 53 y.o.   MRN: [id number]  HPI Pt has been very tired and sleepy. Last week at work he passed out at work and busted lip. This Tuesday he was confused and unable to find the right words to say. Pt has had some unusual headaches. Pt feels very sleepy. Pt had COVID testing done on 07/15/2019 and the results are negative. Pt had labs done by GI recently.  Virtual Visit via Video Note  I connected with [name] on 07/17/19 at  2:00 PM EDT by a video enabled telemedicine application and verified that I am speaking with the correct person using two identifiers.  Location: Patient: home Provider: office   I discussed the limitations of evaluation and management by telemedicine and the availability of in person appointments. The patient expressed understanding and agreed to proceed.  History of Present Illness:    Observations/Objective:   Assessment and Plan:   Follow Up Instructions:    I discussed the assessment and treatment plan with the patient. The patient was provided an opportunity to ask questions and all were answered. The patient agreed with the plan and demonstrated an understanding of the instructions.   The patient was advised to call back or seek an in-person evaluation if the symptoms worsen or if the condition fails to improve as anticipated.  I provided 18 minutes of non-face-to-face time during this encounter.   Vicente Males, LPN Patient notes grogginess.  Murky thinking at times.  Had another passing out spells very weak.  Patient's girlfriend had called [place] asking [place] to order ammonia level 2 days ago in addition to his blood work ordered by his GI specialist.  I encouraged him at that time to get back with his GI specialist and have them order it since they are managing his problem  Review of Systems No headache, no major weight loss or weight gain, no chest pain no back  pain abdominal pain no change in bowel habits complete ROS otherwise negative     Objective:   Physical Exam    Virtual    Assessment & Plan:  Impression progressive challenges with clarity of thought and motor control and unsteadiness.  Known cirrhosis with unfortunately ongoing alcohol intake.  Patient states needs a letter from me stating everything is okay and he can return to work.  Everything is not okay.  His ammonia level is very high.  I advised him I do not manage this at this time and to contact his GI specialist as requested earlier this week

## 2019-07-17 NOTE — Telephone Encounter (Signed)
Patient called stating on Tuesday of this week he was talking to his supervisor and "lost his words", became lightheaded, weak, and "passed out." Needs a work not to return to work.  Labs ordered including HFP and Ammonia. His HFP is improved compared to a month ago and shows AST/ALT 148/36, total bilirubin 2.9, indirect bilirubin 1.2, indirect bilirubin 1.7.  His alkaline phosphatase was a bit worse compared to 1 month ago when he was 184 and today is 319.  Of note, based on his recent office visit, the patient has resumed drinking and is consuming about 1/5 of liquor a day.  Alkaline phosphatase could be an acute phase reactant related to liver inflammation with ongoing ETOH consumption.  Given mental changes and elevated Ammonia of 183 (previously 33-51) will start the patient on lactulose 2-3 times a day and titrate for 3-4 soft bowel movements a day.  Call Monday for follow-up. If further episodes, proceed to the ER.  Cc: AM for follow-up and FYI regarding anticipated callback on Monday

## 2019-07-20 ENCOUNTER — Telehealth: Payer: Self-pay | Admitting: Internal Medicine

## 2019-07-20 DIAGNOSIS — K766 Portal hypertension: Secondary | ICD-10-CM

## 2019-07-20 DIAGNOSIS — K7031 Alcoholic cirrhosis of liver with ascites: Secondary | ICD-10-CM

## 2019-07-20 NOTE — Telephone Encounter (Signed)
Pt said the lactulose is working.  He had 3 BM's yesterday and has had 2 today already.  He says he is still feeling drowsy and weak some.  He is aware not to be driving like this.   He is aware that Randall Hiss is out of the office this afternoon.  I will check with Roseanne Kaufman, NP for recommendations.  Bradley Miles, please advise!

## 2019-07-20 NOTE — Telephone Encounter (Signed)
Pt said that EG told him to call with an update on how his Lactulose was working for him. He asked for EG to call him back at [phone]

## 2019-07-20 NOTE — Telephone Encounter (Signed)
Any worsening of symptoms needs to seek medical attention such as confusion, mental status changes. Await Bradley Miles's return tomorrow.

## 2019-07-21 ENCOUNTER — Other Ambulatory Visit: Payer: Self-pay

## 2019-07-21 DIAGNOSIS — K7031 Alcoholic cirrhosis of liver with ascites: Secondary | ICD-10-CM

## 2019-07-21 DIAGNOSIS — K766 Portal hypertension: Secondary | ICD-10-CM

## 2019-07-21 NOTE — Addendum Note (Signed)
Addended by: Gordy Levan, ERIC A on: 07/21/2019 12:37 PM   Modules accepted: Orders

## 2019-07-21 NOTE — Telephone Encounter (Signed)
Received fax from [facility] and placed on [employer] outside of office door.

## 2019-07-21 NOTE — Telephone Encounter (Signed)
Pt states he is feeling better and has had 2 BM's today.  He is aware we have received the paperwork from [place] [facility].  I am mailing the lab orders to him to do around 07/29/2019.  He would like to know when does he need next OV.  Randall Hiss, please advise!

## 2019-07-21 NOTE — Telephone Encounter (Signed)
The goal of lactulose is 2-3 soft bowel movements a day, so a third BM yesterday is good.  It is because of his last elevated ammonia (and, more so his confusion) that the lactulose was started. Lactulose helps pull ammonia out of his system and put it in his stools, trigger a bowel mvoement to be rid of the ammonia.  He would likely need to be on lactulose (or another therapy for ammonia) indefinitely as a consequence of his liver disease.  I have put in an order to recheck his labs next week (2 weeks after his last labs)  When he can return to work depends on his symptoms. If he has any driving, this could get tenuous because it's not safe to drive with confusion.  Let me know if any other questions.

## 2019-07-21 NOTE — Telephone Encounter (Signed)
See note of 07/20/2019.

## 2019-07-21 NOTE — Telephone Encounter (Signed)
I spoke to pt and his significant other, [name], who is on his DPR. [name]'s # [phone]  Pt did have a third Bm yesterday, it was not watery, but soft.  He does have a lot of fatigue and is very unsure on his steps when walking.  He has not had any alcohol since Sat and started the Lactulose on Sunday.  They are concerned that his last ammonia level was more elevated than previously.  They also asked how long would he have to take the Lactulose ( since he was given so much at one time)?  He has initiated short term disability from work and [employer] should be faxing [place] paperwork Manuela Schwartz is aware).  When will he have more labs and how long is it anticipated that he will be out of work?  [name] said pt really does want to get back to work as soon as he can.  She said that she has to work, especially since he can't now, but he has a son that lives there with them and a daughter who checks on him often.  They also have surveillance cameras and she can check on him from work.   Forwarding to Walden Field, NP who is back in the office today.

## 2019-07-22 NOTE — Telephone Encounter (Signed)
Pt is aware, but he said he will need a note to go back to work.  He asked does he need to wait to go back until then?

## 2019-07-22 NOTE — Telephone Encounter (Signed)
His ability to go back to work will mostly depend on how he feels and if he is having any confusion.

## 2019-07-22 NOTE — Telephone Encounter (Signed)
Previously recommended 6 month follow-up. Let's bump that up to 3 month please (around mid September)

## 2019-07-23 ENCOUNTER — Telehealth: Payer: Self-pay | Admitting: Internal Medicine

## 2019-07-23 IMAGING — US US ABDOMEN LIMITED W/ ELASTOGRAPHY
2 series · 13 of 25 positions shown · non-contrast
Comparison: CT scan 06/02/2018

CLINICAL DATA: Chronic ETOH use.  Hepatic steatosis.

EXAM:
US ABDOMEN LIMITED - RIGHT UPPER QUADRANT
ULTRASOUND HEPATIC ELASTOGRAPHY
TECHNIQUE: Limited right upper quadrant abdominal ultrasound was performed. In
addition, ultrasound elastography evaluation of the liver was
performed. A region of interest was placed in the right lobe of the
liver. Following application of a compressive sonographic pulse,
shear waves were detected in the adjacent hepatic tissue and the
shear wave velocity was calculated. Multiple assessments were
performed at the selected site. Median shear wave velocity is
correlated to a Metavir fibrosis score.

[Series 1: us abdomen limited w/ elastography · 9 of 62 slices shown (1 of 2)]
[im 1/62]
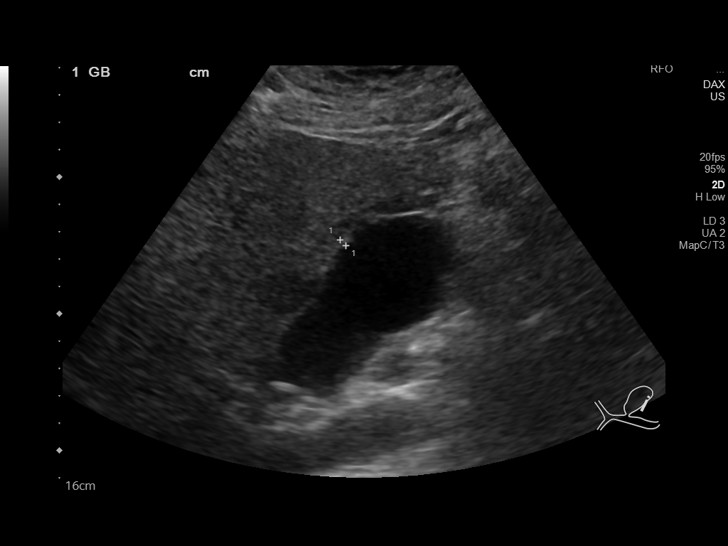
[im 8/62]
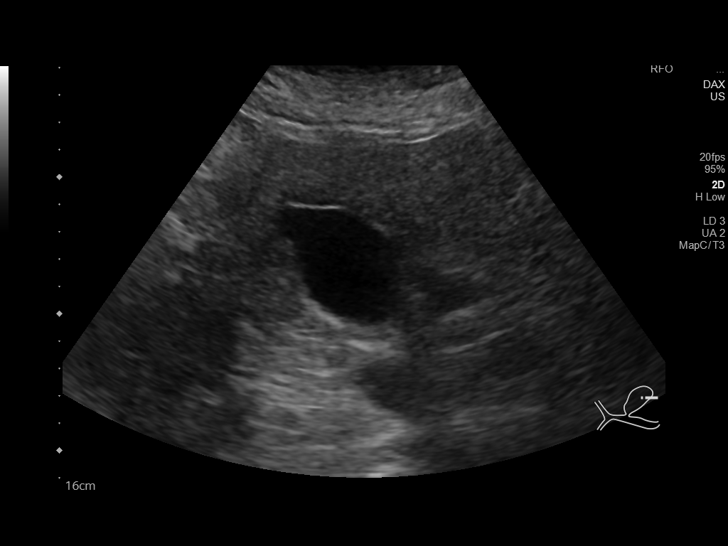
[im 16/62]
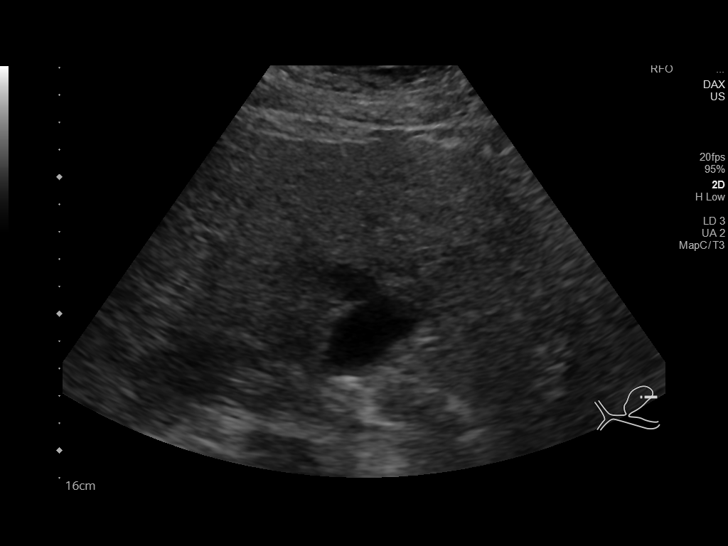
[im 23/62]
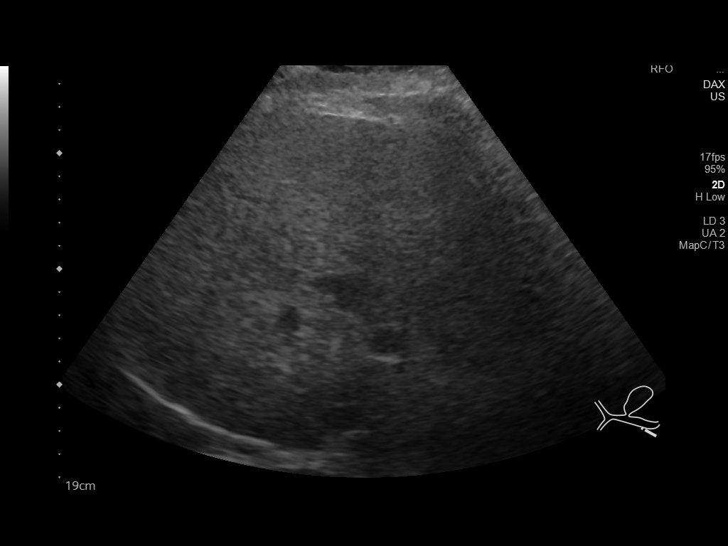
[im 31/62]
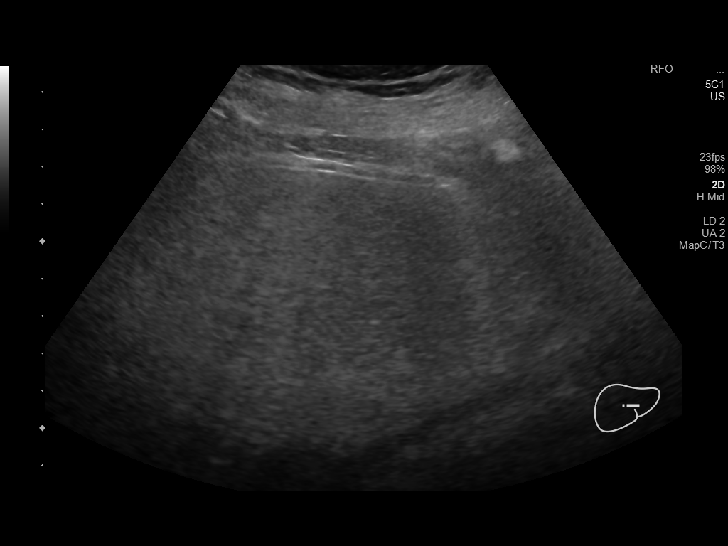
[im 39/62]
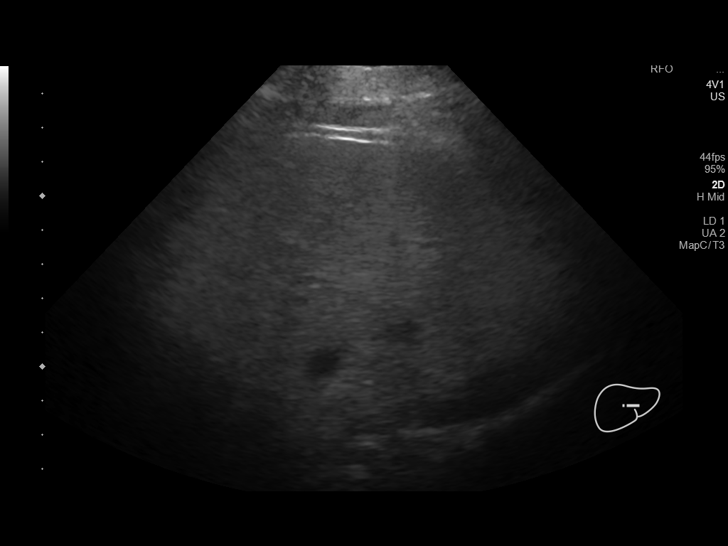
[im 46/62]
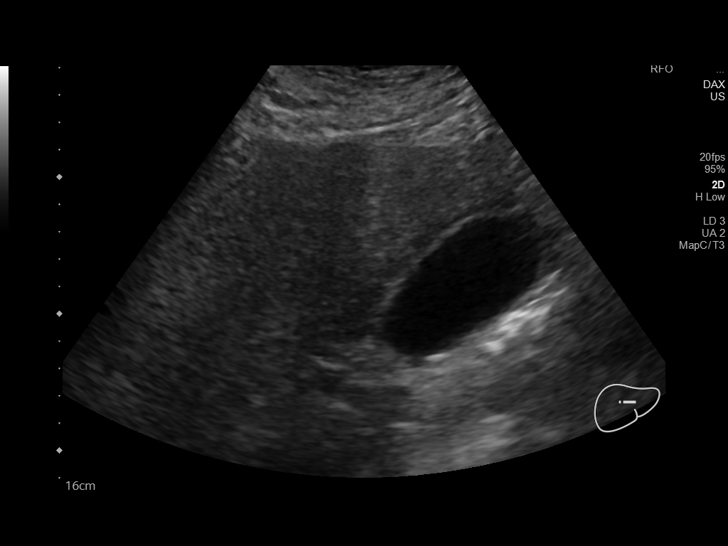
[im 54/62]
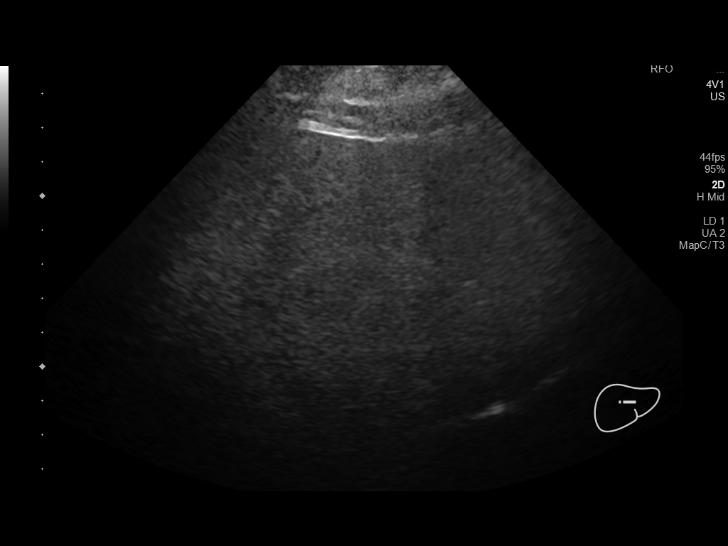
[im 62/62]
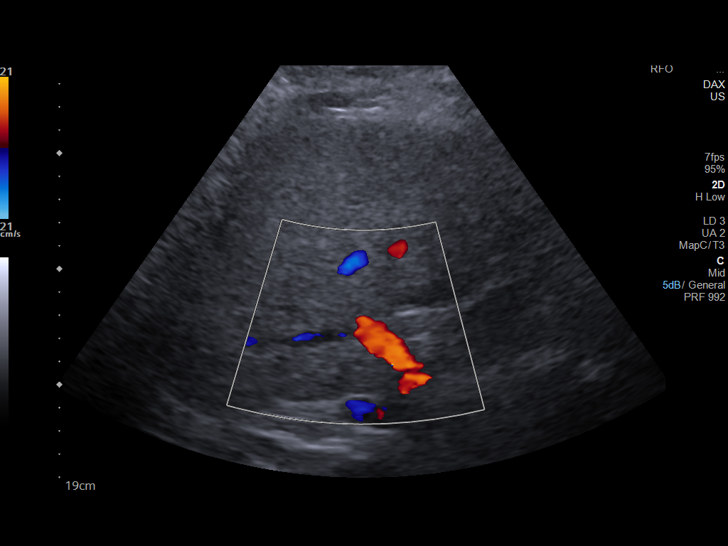

[Series 1001: us abdomen limited w/ elastography · 4 of 28 slices shown (2 of 2)]
[im 4/28]
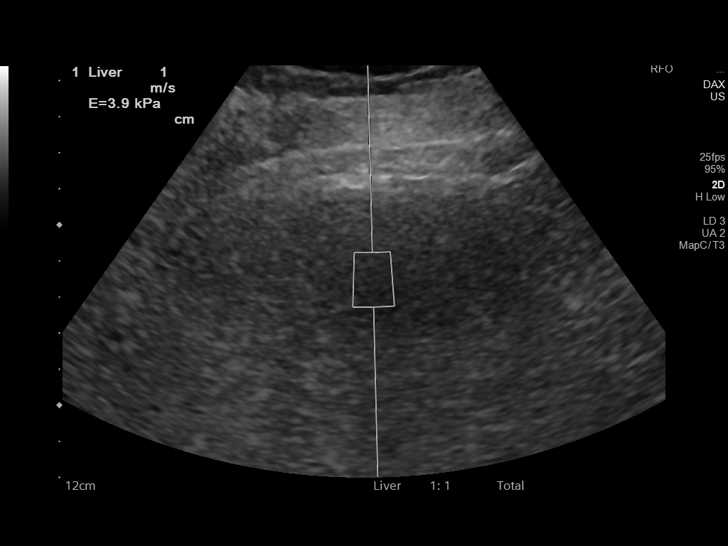
[im 12/28]
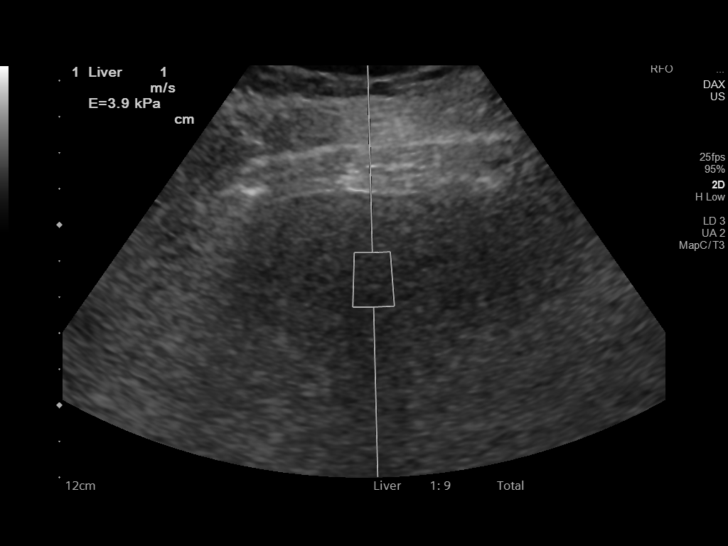
[im 20/28]
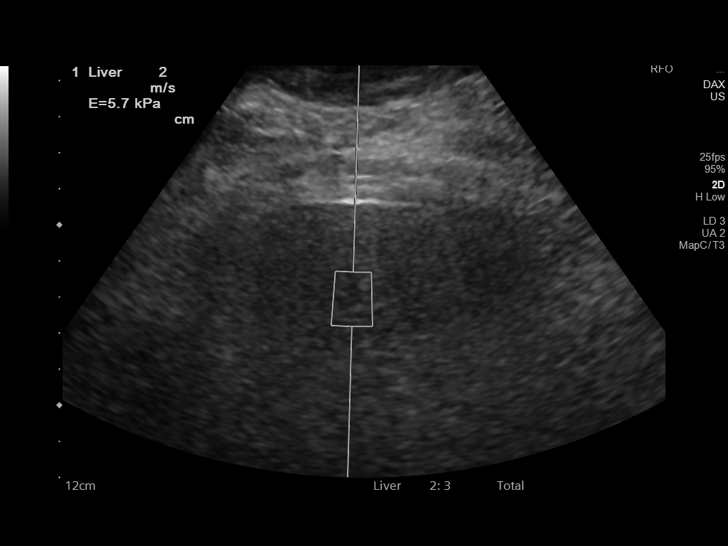
[im 28/28]
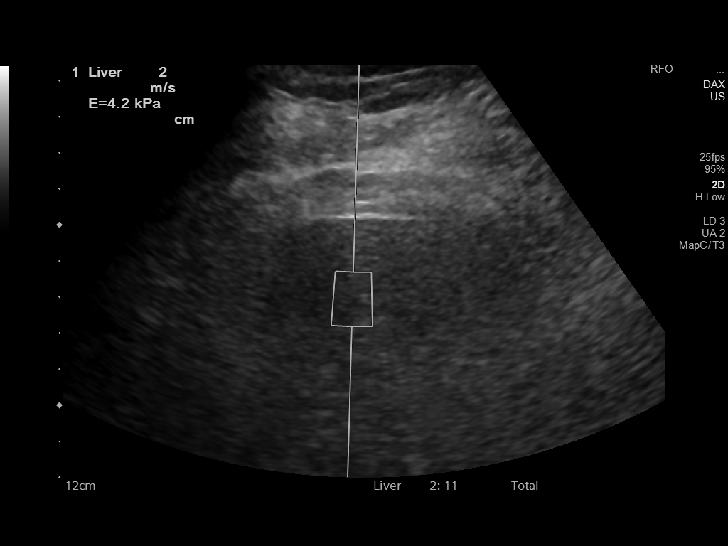

[13 of 25 positions shown; findings below may reference images not displayed]

FINDINGS: ULTRASOUND ABDOMEN LIMITED RIGHT UPPER QUADRANT

Gallbladder:

Gallstones are noted. The largest measures 1.2 cm. No findings for
acute cholecystitis.

Common bile duct:

Diameter: 4.8 mm

Liver:

Diffuse increased echogenicity suggesting fatty infiltration. No
focal hepatic lesions or intrahepatic biliary dilatation. Portal
vein is patent on color Doppler imaging with normal direction of
blood flow towards the liver.

ULTRASOUND HEPATIC ELASTOGRAPHY

Device: Siemens Helix VTQ

Patient position: Left Lateral Decubitus

Transducer MARIE

Number of measurements: 10

Hepatic segment:  8

Median velocity:   1.17 m/sec

IQR:

IQR/Median velocity ratio:

Corresponding Metavir fibrosis score:  F0/F1

Risk of fibrosis: Minimal

Limitations of exam: None

Please note that abnormal shear wave velocities may also be
identified in clinical settings other than with hepatic fibrosis,
such as: acute hepatitis, elevated right heart and central venous
pressures including use of beta blockers, Par disease
(Esee), infiltrative processes such as
mastocytosis/amyloidosis/infiltrative tumor, extrahepatic
cholestasis, in the post-prandial state, and liver transplantation.
Correlation with patient history, laboratory data, and clinical
condition recommended.
IMPRESSION: ULTRASOUND ABDOMEN:

1. Cholelithiasis without findings for acute cholecystitis.
2. Diffuse fatty infiltration of the liver but no focal hepatic
lesions or intrahepatic biliary dilatation.

ULTRASOUND HEPATIC ELASTOGRAHY:

Median hepatic shear wave velocity is calculated at 1.17 m/sec.

Corresponding Metavir fibrosis score is F0/F1.

Risk of fibrosis is Minimal.

Follow-up: None required

## 2019-07-23 NOTE — Telephone Encounter (Signed)
PATIENT WIFE CALLED AND WANTED TO KNOW IF HIS FMLA PAPERS HAVE BEEN FAXED TO [place]   [phone] PLEASE CALL HER BACK EITHER WAY

## 2019-07-23 NOTE — Telephone Encounter (Signed)
EG, pt wants to know if he can get the date on the FMLA papers to move up? Pt says that he is feeling fine. please advise

## 2019-07-23 NOTE — Telephone Encounter (Signed)
Pt is aware that the forms are ready and to come tomorrow before 1130am to pick up and pay $29 fee

## 2019-07-23 NOTE — Telephone Encounter (Signed)
PLEASE CALL PATIENT-THEY NEED TO KNOW HOW LONG HE COULD POSSIBLY BE OUT OF WORK FOR HIS FMLA

## 2019-07-23 NOTE — Telephone Encounter (Signed)
Patient sister called. She wanted to know since patient is so sick why aren't we seeing him in the office. He needs to be seen. She is on his DPR. She just didn't understand. I placed her on hold to read the notes and she hung up. Sending to EG.

## 2019-07-27 ENCOUNTER — Other Ambulatory Visit: Payer: Self-pay | Admitting: *Deleted

## 2019-07-27 ENCOUNTER — Other Ambulatory Visit (HOSPITAL_COMMUNITY)
Admission: RE | Admit: 2019-07-27 | Discharge: 2019-07-27 | Disposition: A | Discharge status: Home / Self Care | Payer: Managed Care, Other (non HMO) | Source: Ambulatory Visit | Attending: Family Medicine | Admitting: Family Medicine

## 2019-07-27 ENCOUNTER — Telehealth: Payer: Self-pay | Admitting: Family Medicine

## 2019-07-27 DIAGNOSIS — I10 Essential (primary) hypertension: Secondary | ICD-10-CM | POA: Insufficient documentation

## 2019-07-27 DIAGNOSIS — R748 Abnormal levels of other serum enzymes: Secondary | ICD-10-CM

## 2019-07-27 DIAGNOSIS — R7989 Other specified abnormal findings of blood chemistry: Secondary | ICD-10-CM

## 2019-07-27 LAB — HEPATIC FUNCTION PANEL
ALT: 34 U/L (ref 0–44)
AST: 86 U/L — ABNORMAL HIGH (ref 15–41)
Albumin: 2.9 g/dL — ABNORMAL LOW (ref 3.5–5.0)
Alkaline Phosphatase: 199 U/L — ABNORMAL HIGH (ref 38–126)
Bilirubin, Direct: 1.4 mg/dL — ABNORMAL HIGH (ref 0.0–0.2)
Indirect Bilirubin: 1.9 mg/dL — ABNORMAL HIGH (ref 0.3–0.9)
Total Bilirubin: 3.3 mg/dL — ABNORMAL HIGH (ref 0.3–1.2)
Total Protein: 7.4 g/dL (ref 6.5–8.1)

## 2019-07-27 LAB — BASIC METABOLIC PANEL
Anion gap: 10 (ref 5–15)
BUN: <5 mg/dL — ABNORMAL LOW (ref 6–20)
CO2: 23 mmol/L (ref 22–32)
Calcium: 8.8 mg/dL — ABNORMAL LOW (ref 8.9–10.3)
Chloride: 100 mmol/L (ref 98–111)
Creatinine, Ser: 0.67 mg/dL (ref 0.61–1.24)
GFR calc Af Amer: >60 mL/min (ref >60)
GFR calc non Af Amer: >60 mL/min (ref >60)
Glucose, Bld: 116 mg/dL — ABNORMAL HIGH (ref 70–99)
Potassium: 3.7 mmol/L (ref 3.5–5.1)
Sodium: 133 mmol/L — ABNORMAL LOW (ref 135–145)

## 2019-07-27 LAB — AMMONIA: Ammonia: 40 umol/L — ABNORMAL HIGH (ref 9–35)

## 2019-07-27 NOTE — Telephone Encounter (Signed)
EG, I spoke with pt's sister. I explained to her that pt said that he was better when we last talked. Pt's sister put the pt on speaker phone and said we are going to lose our brother and he's a good man. Pt's sister said her brother d/c drinking for 8 days and bought alcohol today and drunk the full bottle. Pt's sister states that the providers aren't being up front with pt telling him that he's going to die if he doesn't stop drinking and he needs some help. Pt is going to have lab work drawn for another provider per pt's sister.

## 2019-07-27 NOTE — Telephone Encounter (Signed)
Ok per dr Richardson Landry. [place] notified.

## 2019-07-27 NOTE — Telephone Encounter (Signed)
PT said he still not feel like he is ready to go back to work yet, he still has a little confusion at times.    He will see his PCP tomorrow and be evaluated.  He asked if PCP would need to do the FMLA.  I told him he could discuss with PCP and see what he recommends.

## 2019-07-27 NOTE — Telephone Encounter (Signed)
OK sister may come, He has b w swcheduled for two days from now--14 day follow up--rec he goes for that BW tonight!t so we can have the numbers tomorrow when we see him(I think his sedation is at leawst partly from high ammmonia and pt does not seem to understand that

## 2019-07-27 NOTE — Telephone Encounter (Signed)
Patient sister [name]) would like to attend appointment tomorrow at 3 pm with him due to condition he is in . He cant remember anything and sleeping a lot all the time. Please advise- [phone]

## 2019-07-27 NOTE — Telephone Encounter (Signed)
When we last spoke to him he said he was feeling better. Unfortunately we cannot schedule him for a daily office visit. If there's problems, he should call with specific concerns.

## 2019-07-28 ENCOUNTER — Ambulatory Visit: Payer: Managed Care, Other (non HMO) | Admitting: Family Medicine

## 2019-07-28 ENCOUNTER — Other Ambulatory Visit: Payer: Self-pay

## 2019-07-28 ENCOUNTER — Encounter: Payer: Self-pay | Admitting: Family Medicine

## 2019-07-28 VITALS — BP 122/78 | Temp 98.0°F | Ht 73.0 in | Wt 253.8 lb

## 2019-07-28 DIAGNOSIS — R7989 Other specified abnormal findings of blood chemistry: Secondary | ICD-10-CM

## 2019-07-28 DIAGNOSIS — F10282 Alcohol dependence with alcohol-induced sleep disorder: Secondary | ICD-10-CM

## 2019-07-28 DIAGNOSIS — R748 Abnormal levels of other serum enzymes: Secondary | ICD-10-CM

## 2019-07-28 DIAGNOSIS — I872 Venous insufficiency (chronic) (peripheral): Secondary | ICD-10-CM

## 2019-07-28 DIAGNOSIS — R4182 Altered mental status, unspecified: Secondary | ICD-10-CM

## 2019-07-28 DIAGNOSIS — I739 Peripheral vascular disease, unspecified: Secondary | ICD-10-CM

## 2019-07-28 DIAGNOSIS — J449 Chronic obstructive pulmonary disease, unspecified: Secondary | ICD-10-CM

## 2019-07-28 DIAGNOSIS — G629 Polyneuropathy, unspecified: Secondary | ICD-10-CM

## 2019-07-28 DIAGNOSIS — I10 Essential (primary) hypertension: Secondary | ICD-10-CM

## 2019-07-28 DIAGNOSIS — R55 Syncope and collapse: Secondary | ICD-10-CM

## 2019-07-28 NOTE — Progress Notes (Signed)
Subjective:    Patient ID: Bradley Miles, male    DOB: Aug 19, 1966, 53 y.o.   MRN: [id number]  HPI Patient arrives for a med check and to discuss recent lab work.   Numbness ovrall a bit beter, not a bit problemn  Patient presents to the office with his sister with numerous complicated concerns.  First of all patient is experiencing progressive liver failure.  His albumin as well.  He has edema.  He has rising ammonia.  In fact now is on lactulose for ammonia levels.  When these were at peak the patient felt more sedated than usual.  Patient is reluctant to the acknowledged that he may have cirrhosis of the liver.  Of note was offered an MRI to help pin this down but declined it.  Continues to drink substantial amounts of alcohol.  Generally hard liquor.  This of course leads to excess sedation also  Patient has been using Ativan 1 mg twice daily.  And sometimes even 3 times daily.  Patient's sister reports this adds to sedation.  Patient states he uses on a as needed basis to help self medicate for alcohol withdrawal  Is been through 2 major protracted in-house rehab efforts that both failed within weeks.  These cause many tens of thousands of dollars and patient does not feel he can handle this going forward  Frustrated by ongoing and worsening neuropathy symptoms.  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  Uses Neurontin for his chronic neuropathic pain   Continues to experience spells of loss of consciousness.  This is very concerning to the family    Review of Systems No vomiting no chest pain no headache    Objective:   Physical Exam  Alert and oriented, vitals reviewed and stable, NAD ENT-TM's and ext canals WNL bilat via otoscopic exam Soft palate, tonsils and post pharynx WNL via oropharyngeal exam Neck-symmetric, no masses; thyroid nonpalpable and nontender Pulmonary-no tachypnea or accessory muscle use; Clear without wheezes via  auscultation Card--no abnrml murmurs, rhythm reg and rate WNL Carotid pulses symmetric, without bruits 1.515 legs and feet diminished and nearly absent sensation.Pulses intact.  Capillary refill      Assessment & Plan:   intact impression 1 worsening liver failure now on lactulose for chronic elevation of ammonia.  Patient advised this is abnormal mostly serious and if he continues to drink alcohol you could well die from liver failure  2.  Anxiety ongoing challenge for sure.  Patient's sister feels he self medicates his anxiety.  Will initiate Paxil.  3.  Excessive sedation.  Concerning to the family.  Will back off Ativan to only 1/2 tablet twice per day maximum  4.  Neurological concerns.  Ongoing and worsening and progressive neuropathy.  Also intermittent syncopal events.  One spell I think was a true posttussive syncopal event.  However all of this along with sedation along with family concerns that neurologically he has serious issues would recommend a neurology referral for a fresh look.  He has not seen a neurologist for greater than 5 years  5.  Alcohol abuse very long discussion held.  Patient does not want to go back into inpatient and does not feel he can afford it.  Patient's sister to explore potential other avenues such as intensive outpatient therapy and get back with [place] if we need to fill out any forms  Greater than 50% of this 55 minute face to face visit was spent  in counseling and discussion and coordination of care regarding the above diagnosis/diagnosies

## 2019-07-29 ENCOUNTER — Telehealth: Payer: Self-pay | Admitting: Internal Medicine

## 2019-07-29 MED ORDER — PAROXETINE HCL 20 MG PO TABS: 20.0000 mg | ORAL_TABLET | Freq: Every day | ORAL | 0 refills | Status: DC

## 2019-07-29 MED ORDER — LORAZEPAM 1 MG PO TABS: 0.5000 mg | ORAL_TABLET | Freq: Two times a day (BID) | ORAL | 0 refills | Status: DC | PRN

## 2019-07-29 NOTE — Telephone Encounter (Signed)
Pt's wife called to let RMR know that PCP had ordered labs on patient and wanted RMR to look at them. She also wanted office manager's VM. I transferred called.

## 2019-07-29 NOTE — Telephone Encounter (Signed)
FYI, routing to [employer], see other ov notes in chart.

## 2019-07-30 ENCOUNTER — Ambulatory Visit: Payer: Managed Care, Other (non HMO) | Admitting: Gastroenterology

## 2019-07-30 ENCOUNTER — Other Ambulatory Visit: Payer: Self-pay

## 2019-07-30 ENCOUNTER — Telehealth: Payer: Self-pay | Admitting: Family Medicine

## 2019-07-30 ENCOUNTER — Encounter: Payer: Self-pay | Admitting: Gastroenterology

## 2019-07-30 VITALS — BP 114/74 | HR 72 | Temp 97.0°F | Ht 75.0 in | Wt 252.8 lb

## 2019-07-30 DIAGNOSIS — F1029 Alcohol dependence with unspecified alcohol-induced disorder: Secondary | ICD-10-CM

## 2019-07-30 DIAGNOSIS — R7989 Other specified abnormal findings of blood chemistry: Secondary | ICD-10-CM | POA: Insufficient documentation

## 2019-07-30 DIAGNOSIS — R1011 Right upper quadrant pain: Secondary | ICD-10-CM

## 2019-07-30 DIAGNOSIS — K7682 Hepatic encephalopathy: Secondary | ICD-10-CM

## 2019-07-30 DIAGNOSIS — R945 Abnormal results of liver function studies: Secondary | ICD-10-CM

## 2019-07-30 DIAGNOSIS — K7290 Hepatic failure, unspecified without coma: Secondary | ICD-10-CM

## 2019-07-30 NOTE — Telephone Encounter (Signed)
Need 07/28/2019 note completed so I may send with referral

## 2019-07-30 NOTE — Telephone Encounter (Signed)
Patient is on the schedule for today.

## 2019-07-30 NOTE — Telephone Encounter (Signed)
Patent being seen today.

## 2019-07-30 NOTE — Telephone Encounter (Signed)
No voicemail was left by the wife. Please call and see if they can come in at 11:30 with LSL for an urgent visit since he seems to be having worsening symptoms.

## 2019-07-30 NOTE — Telephone Encounter (Signed)
Routing to [place] for follow-up

## 2019-07-30 NOTE — Progress Notes (Signed)
Primary Care Physician: [name], MD  Primary Gastroenterologist:  Garfield Cornea, MD   Chief Complaint  Patient presents with  . Cirrhosis    feels he is getting better    HPI: Bradley Miles is a 53 y.o. male here for same day office visit. Last seen 05/2019. H/o alcohol dependence with possible early cirrhosis given portal HTN gastropathy seen in EGD in 01/2019. Although on imaging 2019 (CT) no splenomegaly or outright cirrhosis. Previous elastography with Metavir score of F0/F1.  Previously elevated AFP (6.7 in 11/2018), MRI of liver recommended but patient declined.  Multiple phone calls recently from both patient's girlfriend and sister concerned about patient's overall health and his alcohol abuse.  On August 4 he had an episode at work, became lightheaded, weak, slurred words with his supervisor.  They were concerned about his mental state.  Ammonia level was 183.  He was started on lactulose 15 cc 3 times daily.  On Monday patient called and said he still felt a little confused at times did not feel like he can go back to work.  Today, overall he feels like his mental status has improved at this point.  Less groggy and confused.  His girlfriend Bradley Miles is present today.  Reports that he has a neurology appointment in the future because he has a family history of brain tumors and even though feel like mental status changes related to his ammonia level they are concerned about another etiology.  He has been eating good.  Bowel movements about 3 times per day.  No melena or rectal bleeding.  Heartburn controlled.  He is interested in returning to work but given just a few days ago is complaining of feeling confused, has been advised to stay out of a little longer.  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 He has been told not to drive.  He tells me he wants to stop drinking.  He has been  to inpatient rehab twice in the past few years. He does not want to go back to inpatient rehab at [facility].  He is interested in outpatient counseling.  He has been involved in AA in the past but this is more difficult in the setting of COVID, online classes have been difficult for him. He stopped etoh for 3 months after his 06/2018 hospitalization. Typically would drink 1 gallon of whiskey in 3 days. Recently no etoh in 9 days. No DTs. Using Ativan 0.5mg  4-6 times daily. Drank a pint of liquor on Monday or Tuesday.  Right upper quadrant pain intermittent. No n/v.  No melena, brbpr.      Component     Latest Ref Rng & Units 06/13/2018 07/02/2018 12/05/2018 06/03/2019             Albumin     3.5 - 5.0 g/dL 3.2 (L) 3.6    AST     15 - 41 U/L 99 (H) 51 (H) 65 (H) 92 (H)  ALT     0 - 44 U/L 53 (H) 28 38 24  Alkaline Phosphatase     38 - 126 U/L 154 (H) 103    Total Bilirubin     0.3 - 1.2 mg/dL 2.3 (H) 0.6 1.3 (H) 5.4 (H)  Bilirubin, Direct     0.0 - 0.2 mg/dL      Indirect Bilirubin  0.3 - 0.9 mg/dL       Component     Latest Ref Rng & Units 07/15/2019 07/27/2019           Albumin     3.5 - 5.0 g/dL  2.9 (L)  AST     15 - 41 U/L 148 (H) 86 (H)  ALT     0 - 44 U/L 36 34  Alkaline Phosphatase     38 - 126 U/L  199 (H)  Total Bilirubin     0.3 - 1.2 mg/dL 2.9 (H) 3.3 (H)  Bilirubin, Direct     0.0 - 0.2 mg/dL 1.2 (H) 1.4 (H)  Indirect Bilirubin     0.3 - 0.9 mg/dL 1.7 (H) 1.9 (H)      Current Outpatient Medications  Medication Sig Dispense Refill  . acebutolol (SECTRAL) 400 MG capsule TAKE 1 CAPSULE BY MOUTH TWICE A DAY 60 capsule 5  . allopurinol (ZYLOPRIM) 100 MG tablet TAKE 1 TABLET BY MOUTH EVERY DAY 30 tablet 5  . aspirin EC 81 MG tablet Take 81 mg by mouth daily.    Marland Kitchen DEXILANT 60 MG capsule TAKE 1 CAPSULE BY MOUTH EVERY DAY 30 capsule 3  . FIBER PO Take 1 capsule by mouth daily.     Marland Kitchen gabapentin (NEURONTIN) 300 MG capsule TAKE ONE CAPSULE BY MOUTH FOUR TIMES DAILY  360 capsule 0  . lactulose, encephalopathy, (CHRONULAC) 10 GM/15ML SOLN Take 15 mLs (10 g total) by mouth 3 (three) times daily. Titrate for 3-4 soft bowel movements a day 946 mL 0  . LORazepam (ATIVAN) 1 MG tablet Take 0.5 tablets (0.5 mg total) by mouth 2 (two) times daily as needed. for anxiety 30 tablet 0  . nortriptyline (PAMELOR) 50 MG capsule TAKE 1 CAPSULE(50 MG) BY MOUTH AT BEDTIME 30 capsule 5  . Omega-3 Fatty Acids (FISH OIL PO) Take 3 capsules by mouth daily.    Marland Kitchen PARoxetine (PAXIL) 20 MG tablet Take 1 tablet (20 mg total) by mouth daily. 30 tablet 0  . Potassium 99 MG TABS Take 99 mg by mouth daily.    . ranitidine (ZANTAC) 150 MG tablet Take 150 mg by mouth daily as needed for heartburn.    . sildenafil (VIAGRA) 100 MG tablet TAKE 1/2 TABLET BY MOUTH EVERY DAY 3 tablet 3  . thiamine 100 MG tablet Take 1 tablet (100 mg total) by mouth daily.    . traZODone (DESYREL) 50 MG tablet TAKE 1 TABLET BY MOUTH AT BEDTIME AS NEEDED FOR INSOMNIA 30 tablet 5  . vitamin B-12 (CYANOCOBALAMIN) 1000 MCG tablet Take 1,000 mcg by mouth daily.    . ondansetron (ZOFRAN-ODT) 4 MG disintegrating tablet DISSOLVE 1 TABLET ON THE TONGUE EVERY 8 HOURS AS NEEDED FOR NAUSEA 24 tablet 0   No current facility-administered medications for this visit.     Allergies as of 07/30/2019  . (No Known Allergies)    ROS:  General: Negative for anorexia, weight loss, fever, chills, fatigue, +weakness. ENT: Negative for hoarseness, difficulty swallowing , nasal congestion. CV: Negative for chest pain, angina, palpitations, dyspnea on exertion, peripheral edema.  Respiratory: Negative for dyspnea at rest, dyspnea on exertion, cough, sputum, wheezing.  GI: See history of present illness. GU:  Negative for dysuria, hematuria, urinary incontinence, urinary frequency, nocturnal urination.  Endo: Negative for unusual weight change.    Physical Examination:   BP 114/74   Pulse 72   Temp (!) 97 F (36.1 C) (Oral)  Ht 6\' 3"  (1.905 m)   Wt 252 lb 12.8 oz (114.7 kg)   BMI 31.60 kg/m   General: Well-nourished, well-developed in no acute distress. Girlfriend present. Patient provides minimal input, answers questions at times but allows girlfriend to elaborate.  Eyes: No icterus. Mouth: Oropharyngeal mucosa moist and pink , no lesions erythema or exudate. Lungs: Clear to auscultation bilaterally.  Heart: Regular rate and rhythm, no murmurs rubs or gallops.  Abdomen: Bowel sounds are normal, nontender, nondistended, no hepatosplenomegaly or masses, no abdominal bruits or hernia , no rebound or guarding.   Extremities: 1+pitting lower extremity edema. No clubbing or deformities. Neuro: Alert and oriented x 4  No asterixis. Skin: Warm and dry, no jaundice.   Psych: Alert and cooperative, normal mood and affect.  Labs:  Lab Results  Component Value Date   CREATININE 0.67 07/27/2019   BUN <5 (L) 07/27/2019   NA 133 (L) 07/27/2019   K 3.7 07/27/2019   CL 100 07/27/2019   CO2 23 07/27/2019   Lab Results  Component Value Date   ALT 34 07/27/2019   AST 86 (H) 07/27/2019   ALKPHOS 199 (H) 07/27/2019   BILITOT 3.3 (H) 07/27/2019   Lab Results  Component Value Date   WBC 3.8 06/03/2019   HGB 13.5 06/03/2019   HCT 37.2 (L) 06/03/2019   MCV 104.8 (H) 06/03/2019   PLT Clumped platelets 06/03/2019   Lab Results  Component Value Date   INR 1.2 (H) 06/03/2019   INR 1.1 12/05/2018   INR 1.1 07/02/2018     Ammonia: 40, two weeks ago 183  Imaging Studies: No results found.

## 2019-07-30 NOTE — Telephone Encounter (Signed)
Patient is on the schedule for today. We should try and consolidate messaging to hear from the patient OR his sister. Ongoing conflicting messages are getting confusing.

## 2019-07-30 NOTE — Patient Instructions (Signed)
1. Please have your labs done in two weeks.  2. Call the National helpline to get more information about available alcohol dependence resources in your area. They should be able to determine insurance coverage etc as well.  3. Please stop drinking!  4. Follow two gram sodium diet. See handout.  5. Return to the office in 2-3 weeks for follow-up.   Low-Sodium Eating Plan Sodium, which is an element that makes up salt, helps you maintain a healthy balance of fluids in your body. Too much sodium can increase your blood pressure and cause fluid and waste to be held in your body. Your health care provider or dietitian may recommend following this plan if you have high blood pressure (hypertension), kidney disease, liver disease, or heart failure. Eating less sodium can help lower your blood pressure, reduce swelling, and protect your heart, liver, and kidneys. What are tips for following this plan? General guidelines  Most people on this plan should limit their sodium intake to 1,500-2,000 mg (milligrams) of sodium each day. Reading food labels   The Nutrition Facts label lists the amount of sodium in one serving of the food. If you eat more than one serving, you must multiply the listed amount of sodium by the number of servings.  Choose foods with less than 140 mg of sodium per serving.  Avoid foods with 300 mg of sodium or more per serving. Shopping  Look for lower-sodium products, often labeled as "low-sodium" or "no salt added."  Always check the sodium content even if foods are labeled as "unsalted" or "no salt added".  Buy fresh foods. ? Avoid canned foods and premade or frozen meals. ? Avoid canned, cured, or processed meats  Buy breads that have less than 80 mg of sodium per slice. Cooking  Eat more home-cooked food and less restaurant, buffet, and fast food.  Avoid adding salt when cooking. Use salt-free seasonings or herbs instead of table salt or sea salt. Check with your  health care provider or pharmacist before using salt substitutes.  Cook with plant-based oils, such as canola, sunflower, or olive oil. Meal planning  When eating at a restaurant, ask that your food be prepared with less salt or no salt, if possible.  Avoid foods that contain MSG (monosodium glutamate). MSG is sometimes added to [place] food, bouillon, and some canned foods. What foods are recommended? The items listed may not be a complete list. Talk with your dietitian about what dietary choices are best for you. Grains Low-sodium cereals, including oats, puffed wheat and rice, and shredded wheat. Low-sodium crackers. Unsalted rice. Unsalted pasta. Low-sodium bread. Whole-grain breads and whole-grain pasta. Vegetables Fresh or frozen vegetables. "No salt added" canned vegetables. "No salt added" tomato sauce and paste. Low-sodium or reduced-sodium tomato and vegetable juice. Fruits Fresh, frozen, or canned fruit. Fruit juice. Meats and other protein foods Fresh or frozen (no salt added) meat, poultry, seafood, and fish. Low-sodium canned tuna and salmon. Unsalted nuts. Dried peas, beans, and lentils without added salt. Unsalted canned beans. Eggs. Unsalted nut butters. Dairy Milk. Soy milk. Cheese that is naturally low in sodium, such as ricotta cheese, fresh mozzarella, or Swiss cheese Low-sodium or reduced-sodium cheese. Cream cheese. Yogurt. Fats and oils Unsalted butter. Unsalted margarine with no trans fat. Vegetable oils such as canola or olive oils. Seasonings and other foods Fresh and dried herbs and spices. Salt-free seasonings. Low-sodium mustard and ketchup. Sodium-free salad dressing. Sodium-free light mayonnaise. Fresh or refrigerated horseradish. Lemon juice. Vinegar. Homemade,  reduced-sodium, or low-sodium soups. Unsalted popcorn and pretzels. Low-salt or salt-free chips. What foods are not recommended? The items listed may not be a complete list. Talk with your dietitian  about what dietary choices are best for you. Grains Instant hot cereals. Bread stuffing, pancake, and biscuit mixes. Croutons. Seasoned rice or pasta mixes. Noodle soup cups. Boxed or frozen macaroni and cheese. Regular salted crackers. Self-rising flour. Vegetables Sauerkraut, pickled vegetables, and relishes. Olives. [place] fries. Onion rings. Regular canned vegetables (not low-sodium or reduced-sodium). Regular canned tomato sauce and paste (not low-sodium or reduced-sodium). Regular tomato and vegetable juice (not low-sodium or reduced-sodium). Frozen vegetables in sauces. Meats and other protein foods Meat or fish that is salted, canned, smoked, spiced, or pickled. Bacon, ham, sausage, hotdogs, corned beef, chipped beef, packaged lunch meats, salt pork, jerky, pickled herring, anchovies, regular canned tuna, sardines, salted nuts. Dairy Processed cheese and cheese spreads. Cheese curds. Blue cheese. Feta cheese. String cheese. Regular cottage cheese. Buttermilk. Canned milk. Fats and oils Salted butter. Regular margarine. Ghee. Bacon fat. Seasonings and other foods Onion salt, garlic salt, seasoned salt, table salt, and sea salt. Canned and packaged gravies. Worcestershire sauce. Tartar sauce. Barbecue sauce. Teriyaki sauce. Soy sauce, including reduced-sodium. Steak sauce. Fish sauce. Oyster sauce. Cocktail sauce. Horseradish that you find on the shelf. Regular ketchup and mustard. Meat flavorings and tenderizers. Bouillon cubes. Hot sauce and Tabasco sauce. Premade or packaged marinades. Premade or packaged taco seasonings. Relishes. Regular salad dressings. Salsa. Potato and tortilla chips. Corn chips and puffs. Salted popcorn and pretzels. Canned or dried soups. Pizza. Frozen entrees and pot pies. Summary  Eating less sodium can help lower your blood pressure, reduce swelling, and protect your heart, liver, and kidneys.  Most people on this plan should limit their sodium intake to  1,500-2,000 mg (milligrams) of sodium each day.  Canned, boxed, and frozen foods are high in sodium. Restaurant foods, fast foods, and pizza are also very high in sodium. You also get sodium by adding salt to food.  Try to cook at home, eat more fresh fruits and vegetables, and eat less fast food, canned, processed, or prepared foods. This information is not intended to replace advice given to you by your health care provider. Make sure you discuss any questions you have with your health care provider. Document Released: 05/18/2002 Document Revised: 11/08/2017 Document Reviewed: 11/19/2016 Elsevier Patient Education  2020 [employer].

## 2019-07-31 NOTE — Telephone Encounter (Signed)
Will do at lunch

## 2019-07-31 NOTE — Assessment & Plan Note (Addendum)
Continues to drink.  Recently stopped for 9 days, no apparent DTs.  Drank a pint of liquor this week.  Episode of slurred speech, being off balance, confusion a couple weeks ago.  Ammonia level was elevated.  Started on lactulose.  Improving at this time.  Labs this week, AST down, bilirubin slightly up from 2 weeks ago.  Likely some alcoholic hepatitis, unlikely DF greater than 32 based on bilirubin level, no current PT/INR available for calculation.  Pilar Plate discussion with patient and his girlfriend today.  If he does not stop drinking his liver disease will worsen.  He may have early cirrhosis based on what we know currently.  Ongoing alcohol abuse is detrimental to his health and he will die if he continues to drink.  If he develops advanced liver disease, he will not be a candidate for transplant as long as he continues to drink.  If he stops drinking, his liver disease can stabilize or improve.   He is very interested in pursuing outpatient rehabilitation.  [employer] number provided.  At this time he should remain out of work. He has high risk job and we need to successfully manage his encephalopathy.  He will continue lactulose, titrate to 3-4 soft BMs daily.  Offered to start Xifaxan but they declined today.  Recommend 2 g sodium restriction to assist him third spacing, some mild edema noted today on exam.  We will plan to recheck his labs in [redacted] weeks along with a follow-up prior to releasing back to work.  He has been advised previously not to drive right now given his encephalopathy.

## 2019-08-03 ENCOUNTER — Encounter: Payer: Self-pay | Admitting: Family Medicine

## 2019-08-03 NOTE — Progress Notes (Signed)
cc'ed to pcp °

## 2019-08-04 ENCOUNTER — Telehealth: Payer: Self-pay | Admitting: Internal Medicine

## 2019-08-04 DIAGNOSIS — K7031 Alcoholic cirrhosis of liver with ascites: Secondary | ICD-10-CM

## 2019-08-04 MED ORDER — LACTULOSE ENCEPHALOPATHY 10 GM/15ML PO SOLN: ORAL | 11 refills | Status: DC

## 2019-08-04 NOTE — Telephone Encounter (Signed)
[phone] PLEASE CALL PATIENT GIRLFRIEND  HAS SEVERAL QUESTIONS REGARDING HIS PLAN OF CARE AND HIS MEDICATION

## 2019-08-04 NOTE — Telephone Encounter (Signed)
Spoke with pt's girlfriend, she would like to know if pt should continue his [place]. Pt finished the first bottle and has taken 1/3 of the other bottle that he has.  Pt also needs a continuation of his FMLA dates. Does pt need to provide new FMLA papers? They are aware that there is a fee. Pt's f/u is 09/01/2019  with [facility] and pt's girlfriend feel like the pt's  appointment needs to move up and she wants him to see LSL or AB since EG doesn't have anything sooner. Please advise.

## 2019-08-04 NOTE — Addendum Note (Signed)
Addended by: Mahala Menghini on: 08/04/2019 10:25 PM   Modules accepted: Orders

## 2019-08-04 NOTE — Telephone Encounter (Signed)
Continue lactulose, titrate to 3-4 soft stools daily. Note new dosage change of 15-30 cc tid, titrate for 3-4 soft BMs per day. RX sent in.  Can we move patient to [facility] on 9/9 at 11am and move EG's current 9/9 at 11am to AB's open 9/9 11am appt?  EG put him on STD and I would like for patient to follow up with [facility].

## 2019-08-05 ENCOUNTER — Encounter: Payer: Self-pay | Admitting: Internal Medicine

## 2019-08-05 NOTE — Telephone Encounter (Signed)
Bradley Miles, if they need the FMLA papers redone, they will need to bring in more paperwork. They should check with his employers and see if they need new paperwork before they have to pay for them again.

## 2019-08-05 NOTE — Telephone Encounter (Signed)
Moved the patient's, so he can be with [facility] on sept 9th and mailed letter about date chance to sooner

## 2019-08-05 NOTE — Telephone Encounter (Signed)
Noted. Pt's girlfriend is aware that pt should continue Lactulose to have 3-4 soft stools daily. Pt is aware of the change in appointment with [facility]. Pt will check with his job to see what's needed for the [facility] paperwork. Pt or his girlfriend will pay for FMLA paperwork when they drop the papers off.

## 2019-08-06 ENCOUNTER — Ambulatory Visit (HOSPITAL_COMMUNITY)
Admission: RE | Admit: 2019-08-06 | Discharge: 2019-08-06 | Disposition: A | Discharge status: Home / Self Care | Payer: Managed Care, Other (non HMO) | Source: Ambulatory Visit | Attending: Gastroenterology | Admitting: Gastroenterology

## 2019-08-06 ENCOUNTER — Other Ambulatory Visit: Payer: Self-pay

## 2019-08-06 DIAGNOSIS — K7682 Hepatic encephalopathy: Secondary | ICD-10-CM

## 2019-08-06 DIAGNOSIS — R945 Abnormal results of liver function studies: Secondary | ICD-10-CM | POA: Insufficient documentation

## 2019-08-06 DIAGNOSIS — F1029 Alcohol dependence with unspecified alcohol-induced disorder: Secondary | ICD-10-CM | POA: Insufficient documentation

## 2019-08-06 DIAGNOSIS — R1011 Right upper quadrant pain: Secondary | ICD-10-CM | POA: Diagnosis present

## 2019-08-06 DIAGNOSIS — R7989 Other specified abnormal findings of blood chemistry: Secondary | ICD-10-CM

## 2019-08-06 DIAGNOSIS — K7290 Hepatic failure, unspecified without coma: Secondary | ICD-10-CM | POA: Diagnosis not present

## 2019-08-19 ENCOUNTER — Ambulatory Visit: Payer: Managed Care, Other (non HMO) | Admitting: Nurse Practitioner

## 2019-08-19 ENCOUNTER — Encounter: Payer: Self-pay | Admitting: Nurse Practitioner

## 2019-08-19 ENCOUNTER — Other Ambulatory Visit: Payer: Self-pay

## 2019-08-19 ENCOUNTER — Encounter

## 2019-08-19 VITALS — BP 144/91 | HR 81 | Temp 97.1°F | Ht 73.0 in | Wt 260.2 lb

## 2019-08-19 DIAGNOSIS — R945 Abnormal results of liver function studies: Secondary | ICD-10-CM

## 2019-08-19 DIAGNOSIS — R1011 Right upper quadrant pain: Secondary | ICD-10-CM | POA: Diagnosis not present

## 2019-08-19 DIAGNOSIS — R7989 Other specified abnormal findings of blood chemistry: Secondary | ICD-10-CM

## 2019-08-19 DIAGNOSIS — K7290 Hepatic failure, unspecified without coma: Secondary | ICD-10-CM

## 2019-08-19 DIAGNOSIS — K7031 Alcoholic cirrhosis of liver with ascites: Secondary | ICD-10-CM | POA: Diagnosis not present

## 2019-08-19 DIAGNOSIS — K760 Fatty (change of) liver, not elsewhere classified: Secondary | ICD-10-CM

## 2019-08-19 DIAGNOSIS — K7682 Hepatic encephalopathy: Secondary | ICD-10-CM

## 2019-08-19 MED ORDER — RIFAXIMIN 550 MG PO TABS: 550.0000 mg | ORAL_TABLET | Freq: Two times a day (BID) | ORAL | 3 refills | Status: DC

## 2019-08-19 MED ORDER — ONDANSETRON 4 MG PO TBDP: ORAL_TABLET | ORAL | 2 refills | Status: DC

## 2019-08-19 NOTE — Progress Notes (Signed)
Referring Provider: [name], MD Primary Care Physician:  [name], MD Primary GI:  Dr. Gala Romney  Chief Complaint  Patient presents with   Altered Mental Status    occ, shakes    HPI:   Bradley Miles is a 53 y.o. male who presents for liver disease and alcohol abuse.  The patient was last seen in our office 07/30/2019 for hepatic encephalopathy, alcohol dependence, right upper quadrant pain, abnormal LFTs.  Overall felt likely early cirrhosis given portal hypertension on EGD and February 2020 although on imaging in 2019 there is no splenomegaly or outright cirrhosis.  Previous Matavir score 12/05/2018 of F0/F1.  Previously recommended MRI due to elevated AFP at 6.7 in 2019 but the patient declined.  Recent concern by the patient's girlfriend due to mental status changes.  His ammonia level was 183 and he was started on lactulose 15 cc 3 times daily.  At his last visit he felt his mental status had improved and noted himself to be less groggy and confused.  Upcoming neurology appointment due to family history of brain tumors and evaluating for other etiologies of his mental status changes.  Having 3 bowel movements a day without melena or rectal bleeding.  GERD controlled.  Wants to return to work but was advised to stay out a little longer due to confusion.  He has been told not to drive.  At his last visit he indicated he wants to stop drinking but declined inpatient rehab at [facility], although he is interested in outpatient counseling.  He had previously stopped alcohol for 3 months after his July 2019 hospitalization.  History of drinking a gallon of whiskey in 3 days.  At his last visit he noted no alcohol on 9 days without DTs, using Ativan 0.5 mg 4-6 times a day.  His last drink was a pint of liquor.  His LFTs have been elevated and peaked 06/03/2019 with bilirubin at 5.4.  Previously total bili was at 1.3-2.3.  AST/ALT 2:1 ratio noted.  Overall recommended updated labs,  call National helpline for alcohol dependence for available local resources, absolute alcohol cessation, 2 g sodium diet, follow-up in 2 to 3 weeks.  Labs ordered include viral hepatitis panels, iron, TIBC, CMP, INR, CBC.  It does not appear these have been completed. It is noted viral hepatitis serologies were just completed 06/01/2018 which found negative hepatitis C antibody, negative hepatitis B surface antigen, hepatitis B core antibody, negative hepatitis A IgM.  Right upper quadrant ultrasound completed 08/06/2019 which found hepatic steatosis with hepatosplenomegaly.  Cholelithiasis without evidence of acute cholecystitis or biliary ductal dilation.  No focal hepatic lesions.  Today the patient is accompanied by his girlfriend. When told of the policy of only the patient coming back due to COVID-19 unless the patient is unable to answer for themselves. She indicated "he had another episode today and he won't remember the visit or be able to answer" (per the rooming staff). Today they state he hasn't had any ETOH in 3 weeks. He is gaining weight because he's eating rather than drinking ETOH. Occasionally forgets the "middle dose" of lactulose if his girlfriend is at work and will subsequently have some confusion. This morning tried to fill out refinance paperwork and filled it out incorrectly. Intermittent forgetfulness and confusion. Is still taking lactulose, having 2-3 stools when he forgets the mid-day dose. She has tried calling him to take it; has decided to keep him on the phone until he takes  it. They inadvertently forgot to have 07/30/19 labs completed and will go this afternoon to have them drawn. Some intermittent RUQ abdominal discomfort and epigastric discomfort, takes Dexilant and prn famotidine. He has been trying to find a counselor that he had a good relationship with to re-establish with her. Together with an IT consultant we were able to identify her as Birdie Hopes and the patient  will call to schedule an appointment for outpatient therapy. He is having occasional tremors/shakes. Denies yellow of skin/eyes, darkened urine. Denies vomiting, hematochezia, melena, fever, chills, unintentional weight loss. Denies URI or flu-like symptoms. Denies acute loss of sense of taste or smell.  Swelling significantly improved.  NOTE: A minimum of 60 mins was spent with the patient with at least 50% spent on patient education and care coordination  Past Medical History:  Diagnosis Date   Anxiety    Enlarged liver    GERD (gastroesophageal reflux disease)    Hypertension    Palpitations    PVC's (premature ventricular contractions)    Shortness of breath    Varicose veins     Past Surgical History:  Procedure Laterality Date   BIOPSY  02/02/2019   Procedure: BIOPSY;  Surgeon: Daneil Dolin, MD;  Location: [facility];  Service: Endoscopy;;  gastric   COLONOSCOPY WITH ESOPHAGOGASTRODUODENOSCOPY (EGD)  12/16/2012   internal hemorrhoids, colonic diverticulosis, benign polyps, screening in 2024. EGD with mild erosive reflux esophagitis, small hiatal hernia, negative H.pylori   cyst reomved     from spine   ESOPHAGOGASTRODUODENOSCOPY  05/19/09   RMR: Geographic distal esophageal erosions consistent with severe erosive reflux esophagitis. schatzki's ring s/P dilation/small hiatal hernia otherwise normal stomach   ESOPHAGOGASTRODUODENOSCOPY (EGD) WITH PROPOFOL N/A 02/02/2019   Dr. Gala Romney: retained gastric contents. portal HTN gastropathy   WISDOM TOOTH EXTRACTION      Current Outpatient Medications  Medication Sig Dispense Refill   acebutolol (SECTRAL) 400 MG capsule TAKE 1 CAPSULE BY MOUTH TWICE A DAY 60 capsule 5   allopurinol (ZYLOPRIM) 100 MG tablet TAKE 1 TABLET BY MOUTH EVERY DAY 30 tablet 5   aspirin EC 81 MG tablet Take 81 mg by mouth daily.     cholecalciferol (VITAMIN D3) 25 MCG (1000 UT) tablet Take 1,000 Units by mouth daily.     DEXILANT 60 MG  capsule TAKE 1 CAPSULE BY MOUTH EVERY DAY 30 capsule 3   famotidine (PEPCID) 10 MG tablet Take 10 mg by mouth as needed for heartburn or indigestion.     FIBER PO Take 1 capsule by mouth as needed.      folic acid (FOLVITE) 810 MCG tablet Take 400 mcg by mouth daily.     gabapentin (NEURONTIN) 300 MG capsule TAKE ONE CAPSULE BY MOUTH FOUR TIMES DAILY (Patient taking differently: Take 300 mg by mouth 2 (two) times daily. TAKE ONE CAPSULE BY MOUTH FOUR TIMES DAILY) 360 capsule 0   lactulose, encephalopathy, (CHRONULAC) 10 GM/15ML SOLN Take 15-30cc three times daily to Titrate for 3-4 soft bowel movements a day (Patient taking differently: Take 15-30cc three times daily to Titrate for 3-4 soft bowel movements a day (sometimes forgets mid day dose)) 1892 mL 11   LORazepam (ATIVAN) 1 MG tablet Take 0.5 tablets (0.5 mg total) by mouth 2 (two) times daily as needed. for anxiety 30 tablet 0   nortriptyline (PAMELOR) 50 MG capsule TAKE 1 CAPSULE(50 MG) BY MOUTH AT BEDTIME 30 capsule 5   Omega-3 Fatty Acids (FISH OIL PO) Take 3 capsules  by mouth as needed.      ondansetron (ZOFRAN-ODT) 4 MG disintegrating tablet DISSOLVE 1 TABLET ON THE TONGUE EVERY 8 HOURS AS NEEDED FOR NAUSEA 24 tablet 2   PARoxetine (PAXIL) 20 MG tablet Take 1 tablet (20 mg total) by mouth daily. 30 tablet 0   sildenafil (VIAGRA) 100 MG tablet TAKE 1/2 TABLET BY MOUTH EVERY DAY 3 tablet 3   thiamine 100 MG tablet Take 1 tablet (100 mg total) by mouth daily.     traZODone (DESYREL) 50 MG tablet TAKE 1 TABLET BY MOUTH AT BEDTIME AS NEEDED FOR INSOMNIA 30 tablet 5   vitamin B-12 (CYANOCOBALAMIN) 1000 MCG tablet Take 1,000 mcg by mouth daily.     rifaximin (XIFAXAN) 550 MG TABS tablet Take 1 tablet (550 mg total) by mouth 2 (two) times daily. 60 tablet 3   No current facility-administered medications for this visit.     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 Up to six pack or three shots each occurence or more x 20+ yrs.08/19/19 no alcohol past 3 weeks   Drug use: No   Sexual activity: Yes    Partners: Female    Birth control/protection: None  Lifestyle   Physical activity    Days per week: Not on file    Minutes per session: Not on file   Stress: Not on file  Relationships   Social connections    Talks on phone: Not on file    Gets together: Not on file    Attends religious service: Not on file    Active member of club or organization: Not on file    Attends meetings of clubs or organizations: Not on file    Relationship status: Not on file  Other Topics Concern   Not on file  Social History Narrative   Lives alone  Girlfriend & kids 1/2 time    Review of Systems: General: Negative for anorexia, weight loss, fever, chills, fatigue, weakness. Eyes: Negative for vision changes.  ENT: Negative for hoarseness, difficulty swallowing. CV: Negative  for chest pain, angina, palpitations, peripheral edema.  Respiratory: Negative for dyspnea  at rest, cough, sputum, wheezing.  GI: See history of present illness. MS: Negative for joint pain, low back pain.  Derm: Negative for rash or itching.  Neuro: Still some intermittent "absent mindedness" and mild confusion.   Endo: Negative for unusual weight change.  Heme: Negative for bruising or bleeding. Allergy: Negative for rash or hives.   Physical Exam: BP (!) 144/91    Pulse 81    Temp (!) 97.1 F (36.2 C) (Temporal)    Ht 6\' 1"  (1.854 m)    Wt 260 lb 3.2 oz (118 kg)    BMI 34.33 kg/m  General:   Alert and oriented. Pleasant and cooperative. Well-nourished and well-developed.  Eyes:  Without icterus, sclera clear and conjunctiva pink.  Ears:  Normal auditory acuity. Cardiovascular:  S1, S2 present without murmurs appreciated. Extremities without clubbing or edema. Respiratory:  Clear to auscultation bilaterally. No wheezes, rales, or rhonchi. No distress.  Gastrointestinal:  +BS, soft, non-tender and non-distended. No splenomegaly noted. Hepatomegaly appreciated with liver border 3-4 fingerbreadths below the right costal margin. No guarding or rebound. No masses appreciated.  Rectal:  Deferred  Musculoskalatal:  Symmetrical without gross deformities. Neurologic:  Alert and oriented x4;  grossly normal neurologically. Psych:  Alert and cooperative. Normal mood and affect. Heme/Lymph/Immune: No excessive bruising noted.    08/21/2019 4:36 PM   Disclaimer: This note was dictated with voice recognition software. Similar sounding words can inadvertently be transcribed and may not be corrected upon review.

## 2019-08-19 NOTE — Patient Instructions (Addendum)
Your health issues we discussed today were:   Liver disease: 1. Have your labs drawn when you leave our office 2. I will send a refill of your Zofran to help with nausea 3. I have printed out information related to a 2 g sodium diet 4. As we discussed, look into "my fitness pal" which is an app to help you track nutrition information such as sodium 5. Continue to strictly avoid all alcohol 6. Continue lactulose 7. I am providing samples of Xifaxan 550 mg.  Take this twice a day ongoing to help with confusion 8. Call us if you have any worsening or severe symptoms including confusion, shakes, yellowing of skin or eyes, brown urine, etc.  Abdominal pain: 1. As we discussed, the pain on the right upper side is likely because of liver swelling 2. The pain in the upper middle abdomen is likely due to reflux (GERD or heartburn) that you take Dexilant for.  You can have a flare of GERD symptoms when you eat things you should not.  If this happens, you can use Pepcid over-the-counter or Tums as needed 3. Call us if you have any severe worsening symptoms  History of alcoholism: 1. As we discussed, you can use the national helpline as needed for resources to help with alcohol 2. Get in touch with the counselor we found in [place]) and schedule an appointment for counseling 3. Call us if we can provide any additional resources  Overall I recommend:  1. Continue your other current medications 2. Follow-up in 2 to 4 weeks 3. Call us if you have any questions or concerns.   Because of recent events of COVID-19 ("Coronavirus"), follow CDC recommendations:  1. Wash your hand frequently 2. Avoid touching your face 3. Stay away from people who are sick 4. If you have symptoms such as fever, cough, shortness of breath then call your healthcare provider for further guidance 5. If you are sick, STAY AT HOME unless otherwise directed by your healthcare provider. 6. Follow directions from  state and national officials regarding staying safe   At [facility] we value your feedback. You may receive a survey about your visit today. Please share your experience as we strive to create trusting relationships with our patients to provide genuine, compassionate, quality care.  We appreciate your understanding and patience as we review any laboratory studies, imaging, and other diagnostic tests that are ordered as we care for you. Our office policy is 5 business days for review of these results, and any emergent or urgent results are addressed in a timely manner for your best interest. If you do not hear from our office in 1 week, please contact us.   We also encourage the use of MyChart, which contains your medical information for your review as well. If you are not enrolled in this feature, an access code is on this after visit summary for your convenience. Thank you for allowing [place] to be involved in your care.  It was great to see you today!  I hope you have a great Fall!!      Low-Sodium Eating Plan Sodium, which is an element that makes up salt, helps you maintain a healthy balance of fluids in your body. Too much sodium can increase your blood pressure and cause fluid and waste to be held in your body. Your health care provider or dietitian may recommend following this plan if you have high blood pressure (hypertension), kidney disease, liver disease, or heart  failure. Eating less sodium can help lower your blood pressure, reduce swelling, and protect your heart, liver, and kidneys. What are tips for following this plan? General guidelines  Most people on this plan should limit their sodium intake to 1,500-2,000 mg (milligrams) of sodium each day. Reading food labels   The Nutrition Facts label lists the amount of sodium in one serving of the food. If you eat more than one serving, you must multiply the listed amount of sodium by the number of servings.  Choose  foods with less than 140 mg of sodium per serving.  Avoid foods with 300 mg of sodium or more per serving. Shopping  Look for lower-sodium products, often labeled as "low-sodium" or "no salt added."  Always check the sodium content even if foods are labeled as "unsalted" or "no salt added".  Buy fresh foods. ? Avoid canned foods and premade or frozen meals. ? Avoid canned, cured, or processed meats  Buy breads that have less than 80 mg of sodium per slice. Cooking  Eat more home-cooked food and less restaurant, buffet, and fast food.  Avoid adding salt when cooking. Use salt-free seasonings or herbs instead of table salt or sea salt. Check with your health care provider or pharmacist before using salt substitutes.  Cook with plant-based oils, such as canola, sunflower, or olive oil. Meal planning  When eating at a restaurant, ask that your food be prepared with less salt or no salt, if possible.  Avoid foods that contain MSG (monosodium glutamate). MSG is sometimes added to [place] food, bouillon, and some canned foods. What foods are recommended? The items listed may not be a complete list. Talk with your dietitian about what dietary choices are best for you. Grains Low-sodium cereals, including oats, puffed wheat and rice, and shredded wheat. Low-sodium crackers. Unsalted rice. Unsalted pasta. Low-sodium bread. Whole-grain breads and whole-grain pasta. Vegetables Fresh or frozen vegetables. "No salt added" canned vegetables. "No salt added" tomato sauce and paste. Low-sodium or reduced-sodium tomato and vegetable juice. Fruits Fresh, frozen, or canned fruit. Fruit juice. Meats and other protein foods Fresh or frozen (no salt added) meat, poultry, seafood, and fish. Low-sodium canned tuna and salmon. Unsalted nuts. Dried peas, beans, and lentils without added salt. Unsalted canned beans. Eggs. Unsalted nut butters. Dairy Milk. Soy milk. Cheese that is naturally low in sodium,  such as ricotta cheese, fresh mozzarella, or Swiss cheese Low-sodium or reduced-sodium cheese. Cream cheese. Yogurt. Fats and oils Unsalted butter. Unsalted margarine with no trans fat. Vegetable oils such as canola or olive oils. Seasonings and other foods Fresh and dried herbs and spices. Salt-free seasonings. Low-sodium mustard and ketchup. Sodium-free salad dressing. Sodium-free light mayonnaise. Fresh or refrigerated horseradish. Lemon juice. Vinegar. Homemade, reduced-sodium, or low-sodium soups. Unsalted popcorn and pretzels. Low-salt or salt-free chips. What foods are not recommended? The items listed may not be a complete list. Talk with your dietitian about what dietary choices are best for you. Grains Instant hot cereals. Bread stuffing, pancake, and biscuit mixes. Croutons. Seasoned rice or pasta mixes. Noodle soup cups. Boxed or frozen macaroni and cheese. Regular salted crackers. Self-rising flour. Vegetables Sauerkraut, pickled vegetables, and relishes. Olives. [place] fries. Onion rings. Regular canned vegetables (not low-sodium or reduced-sodium). Regular canned tomato sauce and paste (not low-sodium or reduced-sodium). Regular tomato and vegetable juice (not low-sodium or reduced-sodium). Frozen vegetables in sauces. Meats and other protein foods Meat or fish that is salted, canned, smoked, spiced, or pickled. Bacon, ham, sausage, hotdogs, corned  beef, chipped beef, packaged lunch meats, salt pork, jerky, pickled herring, anchovies, regular canned tuna, sardines, salted nuts. Dairy Processed cheese and cheese spreads. Cheese curds. Blue cheese. Feta cheese. String cheese. Regular cottage cheese. Buttermilk. Canned milk. Fats and oils Salted butter. Regular margarine. Ghee. Bacon fat. Seasonings and other foods Onion salt, garlic salt, seasoned salt, table salt, and sea salt. Canned and packaged gravies. Worcestershire sauce. Tartar sauce. Barbecue sauce. Teriyaki sauce. Soy sauce,  including reduced-sodium. Steak sauce. Fish sauce. Oyster sauce. Cocktail sauce. Horseradish that you find on the shelf. Regular ketchup and mustard. Meat flavorings and tenderizers. Bouillon cubes. Hot sauce and Tabasco sauce. Premade or packaged marinades. Premade or packaged taco seasonings. Relishes. Regular salad dressings. Salsa. Potato and tortilla chips. Corn chips and puffs. Salted popcorn and pretzels. Canned or dried soups. Pizza. Frozen entrees and pot pies. Summary  Eating less sodium can help lower your blood pressure, reduce swelling, and protect your heart, liver, and kidneys.  Most people on this plan should limit their sodium intake to 1,500-2,000 mg (milligrams) of sodium each day.  Canned, boxed, and frozen foods are high in sodium. Restaurant foods, fast foods, and pizza are also very high in sodium. You also get sodium by adding salt to food.  Try to cook at home, eat more fresh fruits and vegetables, and eat less fast food, canned, processed, or prepared foods. This information is not intended to replace advice given to you by your health care provider. Make sure you discuss any questions you have with your health care provider. Document Released: 05/18/2002 Document Revised: 11/08/2017 Document Reviewed: 11/19/2016 Elsevier Patient Education  2020 [employer].

## 2019-08-20 LAB — COMPREHENSIVE METABOLIC PANEL
AG Ratio: 0.8 (calc) — ABNORMAL LOW (ref 1.0–2.5)
ALT: 26 U/L (ref 9–46)
AST: 61 U/L — ABNORMAL HIGH (ref 10–35)
Albumin: 3.1 g/dL — ABNORMAL LOW (ref 3.6–5.1)
Alkaline phosphatase (APISO): 160 U/L — ABNORMAL HIGH (ref 35–144)
BUN/Creatinine Ratio: 7 (calc) (ref 6–22)
BUN: 5 mg/dL — ABNORMAL LOW (ref 7–25)
CO2: 27 mmol/L (ref 20–32)
Calcium: 9 mg/dL (ref 8.6–10.3)
Chloride: 105 mmol/L (ref 98–110)
Creat: 0.74 mg/dL (ref 0.70–1.33)
Globulin: 3.7 g/dL (calc) (ref 1.9–3.7)
Glucose, Bld: 114 mg/dL (ref 65–139)
Potassium: 4.3 mmol/L (ref 3.5–5.3)
Sodium: 139 mmol/L (ref 135–146)
Total Bilirubin: 1.5 mg/dL — ABNORMAL HIGH (ref 0.2–1.2)
Total Protein: 6.8 g/dL (ref 6.1–8.1)

## 2019-08-20 LAB — IRON,TIBC AND FERRITIN PANEL
%SAT: 28 % (calc) (ref 20–48)
Ferritin: 163 ng/mL (ref 38–380)
Iron: 61 ug/dL (ref 50–180)
TIBC: 218 mcg/dL (calc) — ABNORMAL LOW (ref 250–425)

## 2019-08-20 LAB — CBC WITH DIFFERENTIAL/PLATELET
Absolute Monocytes: 655 cells/uL (ref 200–950)
Basophils Absolute: 63 cells/uL (ref 0–200)
Basophils Relative: 1 %
Eosinophils Absolute: 321 cells/uL (ref 15–500)
Eosinophils Relative: 5.1 %
HCT: 38.9 % (ref 38.5–50.0)
Hemoglobin: 13.8 g/dL (ref 13.2–17.1)
Lymphs Abs: 939 cells/uL (ref 850–3900)
MCH: 36.4 pg — ABNORMAL HIGH (ref 27.0–33.0)
MCHC: 35.5 g/dL (ref 32.0–36.0)
MCV: 102.6 fL — ABNORMAL HIGH (ref 80.0–100.0)
MPV: 12 fL (ref 7.5–12.5)
Monocytes Relative: 10.4 %
Neutro Abs: 4322 cells/uL (ref 1500–7800)
Neutrophils Relative %: 68.6 %
Platelets: 126 10*3/uL — ABNORMAL LOW (ref 140–400)
RBC: 3.79 10*6/uL — ABNORMAL LOW (ref 4.20–5.80)
RDW: 12.2 % (ref 11.0–15.0)
Total Lymphocyte: 14.9 %
WBC: 6.3 10*3/uL (ref 3.8–10.8)

## 2019-08-20 LAB — PROTIME-INR
INR: 1.1
Prothrombin Time: 11.8 s — ABNORMAL HIGH (ref 9.0–11.5)

## 2019-08-20 LAB — HEPATITIS C ANTIBODY
Hepatitis C Ab: NONREACTIVE
SIGNAL TO CUT-OFF: 0.05 (ref <1.00)

## 2019-08-20 LAB — HEPATITIS B SURFACE ANTIBODY,QUALITATIVE: Hep B S Ab: NONREACTIVE

## 2019-08-20 LAB — HEPATITIS B CORE ANTIBODY, TOTAL: Hep B Core Total Ab: NONREACTIVE

## 2019-08-20 LAB — HEPATITIS A ANTIBODY, TOTAL: Hepatitis A AB,Total: NONREACTIVE

## 2019-08-21 ENCOUNTER — Encounter: Payer: Self-pay | Admitting: Nurse Practitioner

## 2019-08-21 NOTE — Assessment & Plan Note (Signed)
Hepatic steatosis versus alcoholic cirrhosis although Matavir score indicative of more hepatic steatosis and hepatitis.  The patient has been sober for 3 weeks.  He looks much better.  He is putting on weight because he is eating rather than drinking alcohol.  Overall he looks improved.  He does still have confusion and I recommended against driving at this time until we are able to better control confusion.  He currently has lactulose ordered for 3 times a day for 3-4 soft bowel movements a day.  His girlfriend states he will often miss his middle dose of lactulose and only have 2-3 bowel movements that is why he is still confused at times.  She will try to call him during lunch and keep him on the phone until he takes his lactulose dose.  We will also give him samples of Xifaxan and a prescription with paperwork for patient assistance.  Recommended strict alcohol cessation and 2 g sodium diet.  We had a very lengthy and frank discussion about his disease prognosis should he continue to drink which would likely result in his death.  We have identified a counselor that he has worked with in the past and had a good professional relationship with.  He is going to schedule counseling to help with alcohol cessation.  Follow-up in 2 to 4 weeks.

## 2019-08-21 NOTE — Assessment & Plan Note (Signed)
Persistent mild right upper quadrant discomfort.  His liver is swollen and appreciated 3 to 4 fingerbreadths below the right costal margin.  When you palpate the edge of his liver he notes soreness.  Discussed that his liver will likely improve if he stops drinking and continues to be sober.  Monitor for any worsening pain and notify us immediately.  Return for follow-up in 2 to 4 weeks.

## 2019-08-21 NOTE — Assessment & Plan Note (Signed)
Abnormal LFTs in the setting of alcohol induced hepatic steatosis versus cirrhosis.  I will recheck his liver labs today.  Return for follow-up in 2 to 4 weeks.

## 2019-08-21 NOTE — Assessment & Plan Note (Signed)
Hepatic encephalopathy despite lactulose every 2-3 soft bowel movements a day.  He will try to take additional lactulose if he can remember.  We will give him samples of Xifaxan and a prescription along with patient assistance as needed.  I do not feel he should drive until his confusion is significantly better/essentially resolved.  We can update his FMLA paperwork to reflect this.  Further recommendations as per below.  Follow-up in 2 to 4 weeks.

## 2019-08-23 ENCOUNTER — Other Ambulatory Visit: Payer: Self-pay | Admitting: Family Medicine

## 2019-08-28 ENCOUNTER — Other Ambulatory Visit: Payer: Self-pay

## 2019-08-28 ENCOUNTER — Ambulatory Visit (INDEPENDENT_AMBULATORY_CARE_PROVIDER_SITE_OTHER): Payer: Managed Care, Other (non HMO) | Admitting: Family Medicine

## 2019-08-28 ENCOUNTER — Encounter: Payer: Self-pay | Admitting: Family Medicine

## 2019-08-28 VITALS — BP 130/78 | HR 80 | Temp 98.8°F

## 2019-08-28 DIAGNOSIS — R7989 Other specified abnormal findings of blood chemistry: Secondary | ICD-10-CM

## 2019-08-28 DIAGNOSIS — R748 Abnormal levels of other serum enzymes: Secondary | ICD-10-CM | POA: Diagnosis not present

## 2019-08-28 DIAGNOSIS — I10 Essential (primary) hypertension: Secondary | ICD-10-CM | POA: Diagnosis not present

## 2019-08-28 DIAGNOSIS — G629 Polyneuropathy, unspecified: Secondary | ICD-10-CM | POA: Diagnosis not present

## 2019-08-28 DIAGNOSIS — J449 Chronic obstructive pulmonary disease, unspecified: Secondary | ICD-10-CM | POA: Diagnosis not present

## 2019-08-28 DIAGNOSIS — F10282 Alcohol dependence with alcohol-induced sleep disorder: Secondary | ICD-10-CM

## 2019-08-30 NOTE — Progress Notes (Signed)
Patient arrives for follow-up and notes full discussion.  Overall feeling better.  His thinking is much clearer now.  He is compliant with the Paxil.  Reports he is helping with his mood and depression.  He has had only 1 day of drinking alcohol since we last saw him.  He has a name of a counselor but has not contacted her yet.  I encouraged him to do so.  Patient states definitely ready to get back to work.  Patient followed closely by gastroenterologist.  Their notes reviewed.  Patient's ammonia level much better now.  Reports ongoing challenges with neuropathy.  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  Discussed  2.  Generalized anxiety disorder.  Clinically improved patient encouraged to maintain medicine and maintain exercise  3.  Chronic alcohol abuse.  Importance of ongoing counseling discussed.  Patient congratulated on status as far  Medications refilled follow-up in 6 months diet exercise discussed

## 2019-09-01 ENCOUNTER — Ambulatory Visit: Payer: Managed Care, Other (non HMO) | Admitting: Nurse Practitioner

## 2019-09-10 ENCOUNTER — Encounter: Payer: Self-pay | Admitting: Internal Medicine

## 2019-09-10 ENCOUNTER — Ambulatory Visit: Payer: Managed Care, Other (non HMO) | Admitting: Nurse Practitioner

## 2019-09-10 ENCOUNTER — Telehealth: Payer: Self-pay | Admitting: Internal Medicine

## 2019-09-10 NOTE — Telephone Encounter (Signed)
Patient was a no show and letter sent  °

## 2019-09-10 NOTE — Progress Notes (Deleted)
Referring Provider: [name], MD Primary Care Physician:  [name], MD Primary GI:  Dr. Gala Romney  No chief complaint on file.   HPI:   Bradley Miles is a 53 y.o. male who presents for follow-up on cirrhosis and alcoholism. The patient was last seen in our office 12/15/1094 for alcoholic cirrhosis, hepatic encephalopathy, abnormal LFTs, hepatic steatosis, right upper quadrant pain.  Overall feeling of early cirrhosis and portal hypertension on EGD in January 28, 1979 other imaging in 2019 without splenomegaly or outright cirrhosis.  Previous medical year score on 12/05/2018 of F2 0/F1.  Previous MRI due to elevated AFP at 6.7 but the patient declined.  Has had intermittent mental status changes with elevated ammonia, started on lactulose 3 times daily with some improvement but persistent grogginess and confusion.  Family history of brain tumors and upcoming neurology appointment to address this as well as other etiologies of mental status change.  Previous indication of wanting to stop drinking but declined inpatient rehab at [facility], although he did express interest in outpatient counseling.  Previously stopped drinking for 3 months after hospitalization in 2019.  History of drinking a gallon of whiskey every 3 days or so.  Previous LFTs with bilirubin of 5.4, AST/ALT elevated at 2-1 ratio.  Recommended to update labs, call National helpline for alcohol dependence and local resources, absolute alcohol cessation, 2 g sodium diet.  Recommended further lab work-up which did not appear to be completed.  His hepatitis serologies were completed in June 2019 which found negative hepatitis C, hepatitis B, hepatitis A.  Most recent right upper quadrant ultrasound 08/06/2019 with hepatic steatosis with hepatosplenomegaly, cholelithiasis without evidence of acute cholecystitis or biliary ductal dilation.  Visit the patient was accompanied by his girlfriend.  At that time he noted no  alcohol in 3 weeks, gaining weight because he is eating rather than drinking alcohol.  He occasionally forgets to take the "middle dose" of lactulose the at which point you will have some confusion. Intermittent forgetfulness and confusion.  Still on lactulose with 2-3 stools a day when he misses the midday dose.  His girlfriend tries to remind him to take it by calling home and indicates moving forward she will keep him on the phone until she knows that she is taking it.  Indicates he forgot to have his recent labs drawn and will go today to have them completed.  Notes some right upper quadrant intermittent discomfort, on Dexilant and famotidine.  Trying to find a counselor he has a good relationship with and we were able to identify her through an Internet search.  Having occasional tremors/shakes.  No other hepatic or GI symptoms.  Swelling is significantly improved.  Recommended to complete his labs as previously ordered, establish new patient relationship with Birdie Hopes for therapy and alcohol cessation, absolute abstinence from alcohol, Zofran for nausea, 2 g sodium diet, Xifaxan samples, continue Dexilant.  Follow-up in 2 to 4 weeks.  Completed 08/19/2019 which found evidence of iron overload, recommended hepatitis A and B vaccinations due to no immunity, LFTs improved with AST/ALT at 61/26, alkaline phosphatase significantly improved at 168, total bilirubin significantly improved at 1.5.  INR stable at 1.1, CBC with normal white blood cell count of 6.3, normal hemoglobin at 13.8, depressed platelets but improved compared to 9 months prior (current platelet count 126).  Recommended he continue alcohol abstinence.  He would be a meld 9 presuming cirrhosis.  Today he states   Past  Medical History:  Diagnosis Date  . Anxiety   . Enlarged liver   . GERD (gastroesophageal reflux disease)   . Hypertension   . Palpitations   . PVC's (premature ventricular contractions)   . Shortness of breath   .  Varicose veins     Past Surgical History:  Procedure Laterality Date  . BIOPSY  02/02/2019   Procedure: BIOPSY;  Surgeon: Daneil Dolin, MD;  Location: [facility];  Service: Endoscopy;;  gastric  . COLONOSCOPY WITH ESOPHAGOGASTRODUODENOSCOPY (EGD)  12/16/2012   internal hemorrhoids, colonic diverticulosis, benign polyps, screening in 2024. EGD with mild erosive reflux esophagitis, small hiatal hernia, negative H.pylori  . cyst reomved     from spine  . ESOPHAGOGASTRODUODENOSCOPY  05/19/09   RMR: Geographic distal esophageal erosions consistent with severe erosive reflux esophagitis. schatzki's ring s/P dilation/small hiatal hernia otherwise normal stomach  . ESOPHAGOGASTRODUODENOSCOPY (EGD) WITH PROPOFOL N/A 02/02/2019   Dr. Gala Romney: retained gastric contents. portal HTN gastropathy  . WISDOM TOOTH EXTRACTION      Current Outpatient Medications  Medication Sig Dispense Refill  . acebutolol (SECTRAL) 400 MG capsule TAKE 1 CAPSULE BY MOUTH TWICE A DAY 60 capsule 5  . allopurinol (ZYLOPRIM) 100 MG tablet TAKE 1 TABLET BY MOUTH EVERY DAY 30 tablet 5  . aspirin EC 81 MG tablet Take 81 mg by mouth daily.    . cholecalciferol (VITAMIN D3) 25 MCG (1000 UT) tablet Take 1,000 Units by mouth daily.    Marland Kitchen DEXILANT 60 MG capsule TAKE 1 CAPSULE BY MOUTH EVERY DAY 30 capsule 3  . famotidine (PEPCID) 10 MG tablet Take 10 mg by mouth as needed for heartburn or indigestion.    Marland Kitchen FIBER PO Take 1 capsule by mouth as needed.     . folic acid (FOLVITE) 098 MCG tablet Take 400 mcg by mouth daily.    Marland Kitchen gabapentin (NEURONTIN) 300 MG capsule TAKE 1 CAPSULE BY MOUTH FOUR TIMES DAILY 360 capsule 0  . lactulose, encephalopathy, (CHRONULAC) 10 GM/15ML SOLN Take 15-30cc three times daily to Titrate for 3-4 soft bowel movements a day (Patient taking differently: Take 15-30cc three times daily to Titrate for 3-4 soft bowel movements a day (sometimes forgets mid day dose)) 1892 mL 11  . LORazepam (ATIVAN) 1 MG tablet Take  0.5 tablets (0.5 mg total) by mouth 2 (two) times daily as needed. for anxiety 30 tablet 0  . nortriptyline (PAMELOR) 50 MG capsule TAKE 1 CAPSULE(50 MG) BY MOUTH AT BEDTIME 30 capsule 5  . Omega-3 Fatty Acids (FISH OIL PO) Take 3 capsules by mouth as needed.     . ondansetron (ZOFRAN-ODT) 4 MG disintegrating tablet DISSOLVE 1 TABLET ON THE TONGUE EVERY 8 HOURS AS NEEDED FOR NAUSEA 24 tablet 2  . PARoxetine (PAXIL) 20 MG tablet TAKE 1 TABLET(20 MG) BY MOUTH DAILY 30 tablet 0  . rifaximin (XIFAXAN) 550 MG TABS tablet Take 1 tablet (550 mg total) by mouth 2 (two) times daily. 60 tablet 3  . sildenafil (VIAGRA) 100 MG tablet TAKE 1/2 TABLET BY MOUTH EVERY DAY 3 tablet 3  . thiamine 100 MG tablet Take 1 tablet (100 mg total) by mouth daily.    . traZODone (DESYREL) 50 MG tablet TAKE 1 TABLET BY MOUTH AT BEDTIME AS NEEDED FOR INSOMNIA 30 tablet 5  . vitamin B-12 (CYANOCOBALAMIN) 1000 MCG tablet Take 1,000 mcg by mouth daily.     No current facility-administered medications for this visit.     Allergies as of 09/10/2019  . (  No Known Allergies)    Family History  Problem Relation Age of Onset  . Cancer Mother        ?etiology  . Heart disease Mother   . Colon polyps Sister 57       two sisters  . Liver disease Neg Hx     Social History   Socioeconomic History  . Marital status: Significant Other    Spouse name: Not on file  . Number of children: 2  . Years of education: Not on file  . Highest education level: Not on file  Occupational History  . 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 Financial resource strain: Not on file  . Food insecurity    Worry: Not on file    Inability: Not on file  . Transportation needs    Medical: Not on file    Non-medical: Not on file  Tobacco Use  . Smoking status: Current Every Day Smoker    Packs/day: 1.50    Years: 30.00    Pack years: 45.00    Types: Cigarettes    Start date: 12/24/1979  .  Smokeless tobacco: Never Used  Substance and Sexual Activity  . Alcohol use: Not Currently    Alcohol/week: 0.0 standard drinks    Comment: consumes beer or liquor three to four times weekly. Up to six pack or three shots each occurence or more x 20+ yrs.08/19/19 no alcohol past 3 weeks  . Drug use: No  . Sexual activity: Yes    Partners: Female    Birth control/protection: None  Lifestyle  . Physical activity    Days per week: Not on file    Minutes per session: Not on file  . Stress: Not on file  Relationships  . Social Herbalist on phone: Not on file    Gets together: Not on file    Attends religious service: Not on file    Active member of club or organization: Not on file    Attends meetings of clubs or organizations: Not on file    Relationship status: Not on file  Other Topics Concern  . Not on file  Social History Narrative   Lives alone  Girlfriend & kids 1/2 time    Review of Systems: General: Negative for anorexia, weight loss, fever, chills, fatigue, weakness. Eyes: Negative for vision changes.  ENT: Negative for hoarseness, difficulty swallowing , nasal congestion. CV: Negative for chest pain, angina, palpitations, dyspnea on exertion, peripheral edema.  Respiratory: Negative for dyspnea at rest, dyspnea on exertion, cough, sputum, wheezing.  GI: See history of present illness. GU:  Negative for dysuria, hematuria, urinary incontinence, urinary frequency, nocturnal urination.  MS: Negative for joint pain, low back pain.  Derm: Negative for rash or itching.  Neuro: Negative for weakness, abnormal sensation, seizure, frequent headaches, memory loss, confusion.  Psych: Negative for anxiety, depression, suicidal ideation, hallucinations.  Endo: Negative for unusual weight change.  Heme: Negative for bruising or bleeding. Allergy: Negative for rash or hives.   Physical Exam: There were no vitals taken for this visit. General:   Alert and oriented.  Pleasant and cooperative. Well-nourished and well-developed.  Head:  Normocephalic and atraumatic. Eyes:  Without icterus, sclera clear and conjunctiva pink.  Ears:  Normal auditory acuity. Mouth:  No deformity or lesions, oral mucosa pink.  Throat/Neck:  Supple, without mass or thyromegaly. Cardiovascular:  S1, S2 present without murmurs appreciated. Normal pulses noted.  Extremities without clubbing or edema. Respiratory:  Clear to auscultation bilaterally. No wheezes, rales, or rhonchi. No distress.  Gastrointestinal:  +BS, soft, non-tender and non-distended. No HSM noted. No guarding or rebound. No masses appreciated.  Rectal:  Deferred  Musculoskalatal:  Symmetrical without gross deformities. Normal posture. Skin:  Intact without significant lesions or rashes. Neurologic:  Alert and oriented x4;  grossly normal neurologically. Psych:  Alert and cooperative. Normal mood and affect. Heme/Lymph/Immune: No significant cervical adenopathy. No excessive bruising noted.    09/10/2019 12:58 PM   Disclaimer: This note was dictated with voice recognition software. Similar sounding words can inadvertently be transcribed and may not be corrected upon review.

## 2019-09-15 ENCOUNTER — Ambulatory Visit: Payer: Managed Care, Other (non HMO) | Admitting: Nurse Practitioner

## 2019-09-16 ENCOUNTER — Ambulatory Visit: Payer: Managed Care, Other (non HMO) | Admitting: Neurology

## 2019-09-17 ENCOUNTER — Other Ambulatory Visit: Payer: Self-pay

## 2019-09-17 DIAGNOSIS — Z20822 Contact with and (suspected) exposure to covid-19: Secondary | ICD-10-CM

## 2019-09-19 ENCOUNTER — Other Ambulatory Visit: Payer: Self-pay | Admitting: Family Medicine

## 2019-09-19 LAB — NOVEL CORONAVIRUS, NAA: SARS-CoV-2, NAA: NOT DETECTED

## 2019-09-22 NOTE — Telephone Encounter (Signed)
Six mo

## 2019-09-23 ENCOUNTER — Other Ambulatory Visit: Payer: Self-pay | Admitting: Family Medicine

## 2019-10-09 ENCOUNTER — Ambulatory Visit: Payer: Managed Care, Other (non HMO) | Admitting: Cardiovascular Disease

## 2019-10-09 ENCOUNTER — Other Ambulatory Visit: Payer: Self-pay

## 2019-10-09 ENCOUNTER — Encounter: Payer: Self-pay | Admitting: Cardiovascular Disease

## 2019-10-09 VITALS — BP 145/79 | HR 71 | Temp 97.8°F | Ht 73.0 in | Wt 269.0 lb

## 2019-10-09 DIAGNOSIS — M7989 Other specified soft tissue disorders: Secondary | ICD-10-CM | POA: Diagnosis not present

## 2019-10-09 DIAGNOSIS — R002 Palpitations: Secondary | ICD-10-CM

## 2019-10-09 DIAGNOSIS — R0789 Other chest pain: Secondary | ICD-10-CM

## 2019-10-09 DIAGNOSIS — K766 Portal hypertension: Secondary | ICD-10-CM

## 2019-10-09 DIAGNOSIS — I10 Essential (primary) hypertension: Secondary | ICD-10-CM

## 2019-10-09 NOTE — Patient Instructions (Signed)
Medication Instructions:  Your physician recommends that you continue on your current medications as directed. Please refer to the Current Medication list given to you today.  *If you need a refill on your cardiac medications before your next appointment, please call your pharmacy*  Lab Work: None today If you have labs (blood work) drawn today and your tests are completely normal, you will receive your results only by: Marland Kitchen MyChart Message (if you have MyChart) OR . A paper copy in the mail If you have any lab test that is abnormal or we need to change your treatment, we will call you to review the results.  Testing/Procedures: Your physician has requested that you have a lower  venous duplex. This test is an ultrasound of the veins in the legs  . It looks at venous blood flow that carries blood from the heart to the legs . Allow one hour for a Lower Venous exam.  . There are no restrictions or special instructions.     Follow-Up: At [facility], you and your health needs are our priority.  As part of our continuing mission to provide you with exceptional heart care, we have created designated Provider Care Teams.  These Care Teams include your primary Cardiologist (physician) and Advanced Practice Providers (APPs -  Physician Assistants and Nurse Practitioners) who all work together to provide you with the care you need, when you need it.  Your next appointment:   3 months  The format for your next appointment:   In Person  Provider:   You may see one of the following Advanced Practice Providers on your designated Care Team:    [place], PA-C   Ermalinda Barrios, PA-C    Other Instructions None

## 2019-10-09 NOTE — Progress Notes (Signed)
CARDIOLOGY CONSULT NOTE  Patient ID: Bradley Miles MRN: [id number] DOB/AGE: 03-25-1966 53 y.o.  Admit date: (Not on file) Primary Physician: Bradley Kirschner, MD  Reason for Consultation: Left leg swelling and palpitations  HPI: Bradley Miles is a 53 y.o. male with a past medical history significant for chronic liver disease secondary to alcohol abuse who is being seen today for the evaluation of left leg swelling and palpitations at the request of Luking, Grace Bushy, MD.   He is here with his wife who is a retired Marine scientist.  She is to work in the [place] at [employer].  He has developed left leg swelling over the last 6 to 8 weeks.  Apparently the edema was much worse but has subsided to some degree in the past 3 to 4 days.  He does not take diuretics.  He has compression stockings but does not wear them unless his wife insists on it.  He denies orthopnea and paroxysmal nocturnal dyspnea.  He has had occasional episodes of chest pressure at rest with one episode lasting for 2 hours.  He has a long history of palpitations and takes a sputum all.  They are not certain of the dose.  His wife checks his blood pressure intermittently and readings are in the 140/70 range or less.  He underwent normal lower extremity venous Dopplers on 06/02/2018.  Nuclear stress testing on 12/27/15 showed no evidence of myocardial ischemia or scar and was deemed a low risk study, calculated LVEF 69%.  Echocardiogram on 06/02/2018 demonstrated normal LV systolic function, EF 60 to 65%, moderate LVH.  I personally reviewed the ECG performed on 06/05/2018 which demonstrated sinus rhythm with no ischemic abnormalities nor any arrhythmias.     No Known Allergies  Current Outpatient Medications  Medication Sig Dispense Refill  . allopurinol (ZYLOPRIM) 100 MG tablet TAKE 1 TABLET BY MOUTH EVERY DAY 30 tablet 5  . aspirin EC 81 MG tablet Take 81 mg by mouth daily.    . cholecalciferol (VITAMIN D3) 25  MCG (1000 UT) tablet Take 1,000 Units by mouth daily.    Marland Kitchen DEXILANT 60 MG capsule TAKE 1 CAPSULE BY MOUTH EVERY DAY 30 capsule 3  . famotidine (PEPCID) 10 MG tablet Take 10 mg by mouth as needed for heartburn or indigestion.    . folic acid (FOLVITE) 962 MCG tablet Take 400 mcg by mouth daily.    Marland Kitchen gabapentin (NEURONTIN) 300 MG capsule TAKE 1 CAPSULE BY MOUTH FOUR TIMES DAILY (Patient taking differently: Take 300 mg by mouth 2 (two) times daily. TAKE 1 CAPSULE BY MOUTH FOUR TIMES DAILY) 360 capsule 0  . lactulose, encephalopathy, (CHRONULAC) 10 GM/15ML SOLN Take 15-30cc three times daily to Titrate for 3-4 soft bowel movements a day (Patient taking differently: Take 15-30cc three times daily to Titrate for 3-4 soft bowel movements a day (sometimes forgets mid day dose)) 1892 mL 11  . LORazepam (ATIVAN) 1 MG tablet Take 0.5 tablets (0.5 mg total) by mouth 2 (two) times daily as needed. for anxiety 30 tablet 0  . nortriptyline (PAMELOR) 50 MG capsule TAKE 1 CAPSULE(50 MG) BY MOUTH AT BEDTIME 30 capsule 5  . Omega-3 Fatty Acids (FISH OIL PO) Take 3 capsules by mouth as needed.     . ondansetron (ZOFRAN-ODT) 4 MG disintegrating tablet DISSOLVE 1 TABLET ON THE TONGUE EVERY 8 HOURS AS NEEDED FOR NAUSEA 24 tablet 2  . PARoxetine (PAXIL) 20 MG tablet TAKE 1 TABLET(20  MG) BY MOUTH DAILY 30 tablet 5  . sildenafil (VIAGRA) 100 MG tablet TAKE 1/2 TABLET BY MOUTH EVERY DAY 3 tablet 3  . vitamin B-12 (CYANOCOBALAMIN) 1000 MCG tablet Take 1,000 mcg by mouth daily.     No current facility-administered medications for this visit.     Past Medical History:  Diagnosis Date  . Anxiety   . Enlarged liver   . GERD (gastroesophageal reflux disease)   . Hypertension   . Palpitations   . PVC's (premature ventricular contractions)   . Shortness of breath   . Varicose veins     Past Surgical History:  Procedure Laterality Date  . BIOPSY  02/02/2019   Procedure: BIOPSY;  Surgeon: Daneil Dolin, MD;  [facility]  [facility];  Service: Endoscopy;;  gastric  . COLONOSCOPY WITH ESOPHAGOGASTRODUODENOSCOPY (EGD)  12/16/2012   internal hemorrhoids, colonic diverticulosis, benign polyps, screening in 2024. EGD with mild erosive reflux esophagitis, small hiatal hernia, negative H.pylori  . cyst reomved     from spine  . ESOPHAGOGASTRODUODENOSCOPY  05/19/09   RMR: Geographic distal esophageal erosions consistent with severe erosive reflux esophagitis. schatzki's ring s/P dilation/small hiatal hernia otherwise normal stomach  . ESOPHAGOGASTRODUODENOSCOPY (EGD) WITH PROPOFOL N/A 02/02/2019   Dr. Gala Romney: retained gastric contents. portal HTN gastropathy  . WISDOM TOOTH EXTRACTION      Social History   Socioeconomic History  . Marital status: Significant Other    Spouse name: Not on file  . Number of children: 2  . Years of education: Not on file  . Highest education level: Not on file  Occupational History  . 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 Financial resource strain: Not on file  . Food insecurity    Worry: Not on file    Inability: Not on file  . Transportation needs    Medical: Not on file    Non-medical: Not on file  Tobacco Use  . Smoking status: Current Every Day Smoker    Packs/day: 1.50    Years: 30.00    Pack years: 45.00    Types: Cigarettes    Start date: 12/24/1979  . Smokeless tobacco: Never Used  Substance and Sexual Activity  . Alcohol use: Yes    Alcohol/week: 0.0 standard drinks    Comment: consumes beer or liquor three to four times weekly. Up to six pack or three shots each occurence or more x 20+ yrs.08/19/19 no alcohol past 3 weeks  . Drug use: No  . Sexual activity: Yes    Partners: Female    Birth control/protection: None  Lifestyle  . Physical activity    Days per week: Not on file    Minutes per session: Not on file  . Stress: Not on file  Relationships  . Social Herbalist on phone: Not on file    Gets  together: Not on file    Attends religious service: Not on file    Active member of club or organization: Not on file    Attends meetings of clubs or organizations: Not on file    Relationship status: Not on file  . Intimate partner violence    Fear of current or ex partner: Not on file    Emotionally abused: Not on file    Physically abused: Not on file    Forced sexual activity: Not on file  Other Topics Concern  . Not on file  Social History Narrative  Lives alone  Girlfriend & kids 1/2 time     No family history of premature CAD in 1st degree relatives.  Current Meds  Medication Sig  . allopurinol (ZYLOPRIM) 100 MG tablet TAKE 1 TABLET BY MOUTH EVERY DAY  . aspirin EC 81 MG tablet Take 81 mg by mouth daily.  . cholecalciferol (VITAMIN D3) 25 MCG (1000 UT) tablet Take 1,000 Units by mouth daily.  Marland Kitchen DEXILANT 60 MG capsule TAKE 1 CAPSULE BY MOUTH EVERY DAY  . famotidine (PEPCID) 10 MG tablet Take 10 mg by mouth as needed for heartburn or indigestion.  . folic acid (FOLVITE) 599 MCG tablet Take 400 mcg by mouth daily.  Marland Kitchen gabapentin (NEURONTIN) 300 MG capsule TAKE 1 CAPSULE BY MOUTH FOUR TIMES DAILY (Patient taking differently: Take 300 mg by mouth 2 (two) times daily. TAKE 1 CAPSULE BY MOUTH FOUR TIMES DAILY)  . lactulose, encephalopathy, (CHRONULAC) 10 GM/15ML SOLN Take 15-30cc three times daily to Titrate for 3-4 soft bowel movements a day (Patient taking differently: Take 15-30cc three times daily to Titrate for 3-4 soft bowel movements a day (sometimes forgets mid day dose))  . LORazepam (ATIVAN) 1 MG tablet Take 0.5 tablets (0.5 mg total) by mouth 2 (two) times daily as needed. for anxiety  . nortriptyline (PAMELOR) 50 MG capsule TAKE 1 CAPSULE(50 MG) BY MOUTH AT BEDTIME  . Omega-3 Fatty Acids (FISH OIL PO) Take 3 capsules by mouth as needed.   . ondansetron (ZOFRAN-ODT) 4 MG disintegrating tablet DISSOLVE 1 TABLET ON THE TONGUE EVERY 8 HOURS AS NEEDED FOR NAUSEA  . PARoxetine  (PAXIL) 20 MG tablet TAKE 1 TABLET(20 MG) BY MOUTH DAILY  . sildenafil (VIAGRA) 100 MG tablet TAKE 1/2 TABLET BY MOUTH EVERY DAY  . vitamin B-12 (CYANOCOBALAMIN) 1000 MCG tablet Take 1,000 mcg by mouth daily.  . [DISCONTINUED] thiamine 100 MG tablet Take 1 tablet (100 mg total) by mouth daily.      Review of systems complete and found to be negative unless listed above in HPI    Physical exam Blood pressure (!) 145/79, pulse 71, temperature 97.8 F (36.6 C), height 6\' 1"  (1.854 m), SpO2 93 %. General: NAD, obese male Neck: No JVD, no thyromegaly or thyroid nodule.  Lungs: Poor air movement, no crackles or wheezes CV: Nondisplaced PMI. Regular rate and rhythm, normal S1/S2, no S3/S4, no murmur.  1+ pitting left leg edema with mottled appearance.  Mottled appearance of right lower extremity with trace edema. Abdomen: Soft, nontender, no distention.  Skin: Intact without lesions or rashes.  Neurologic: Alert and oriented x 3.  Psych: Normal affect. Extremities: No clubbing or cyanosis.  HEENT: Normal.   ECG: Most recent ECG reviewed.   Labs: Lab Results  Component Value Date/Time   K 4.3 08/19/2019 01:04 PM   BUN 5 (L) 08/19/2019 01:04 PM   BUN 6 07/02/2018 11:26 AM   CREATININE 0.74 08/19/2019 01:04 PM   ALT 26 08/19/2019 01:04 PM   TSH 9.034 (H) 06/01/2018 08:32 AM   TSH 2.010 07/15/2015 11:02 AM   HGB 13.8 08/19/2019 01:04 PM   HGB 13.2 07/02/2018 11:26 AM     Lipids: Lab Results  Component Value Date/Time   LDLCALC 124 (H) 03/17/2014 08:01 AM   CHOL 219 (H) 03/17/2014 08:01 AM   TRIG 221 (H) 03/17/2014 08:01 AM   HDL 51 03/17/2014 08:01 AM        ASSESSMENT AND PLAN:  1.  Left leg swelling: While this may be due  to portal hypertension and consequent bilateral lower extremity edema, I will obtain venous Dopplers to rule out DVT.  He likely has venous insufficiency and I have recommended compression stockings which he apparently has but chooses not to wear  according to his wife.  Echocardiogram from 2019 reviewed above.  He denies orthopnea and paroxysmal nocturnal dyspnea.  2.  Hypertension: Blood pressure is elevated.  He is no longer on enalapril because he decided to stop taking it about 14 months ago.  His wife checks it sporadically and blood pressures in the 140/70 range or lower.  3.  Palpitations/PSVT: He has been having more palpitations over the past year.  He wore an event monitor in January 2016 which demonstrated sinus rhythm with PVCs and a brief episode of SVT.  He has a history of generalized anxiety disorder.  He does note that palpitations are worse at work when he is under more stress.  Given his long history of alcohol abuse he is at risk for the development of atrial fibrillation.  I will obtain a 30-day event monitor.  He is on a sputum wall but did not bring in the bottle and is unaware of the dose.  4.  Chest pressure: This is sporadic and atypical.  I will hold off on stress testing for now.     Disposition: Follow up in 3 months with [facility]  Signed: Kate Sable, M.D., F.A.C.C.  10/09/2019, 1:18 PM

## 2019-10-13 ENCOUNTER — Other Ambulatory Visit: Payer: Self-pay

## 2019-10-13 ENCOUNTER — Ambulatory Visit (HOSPITAL_COMMUNITY)
Admission: RE | Admit: 2019-10-13 | Discharge: 2019-10-13 | Disposition: A | Discharge status: Home / Self Care | Payer: Managed Care, Other (non HMO) | Source: Ambulatory Visit | Attending: Cardiovascular Disease | Admitting: Cardiovascular Disease

## 2019-10-13 DIAGNOSIS — M7989 Other specified soft tissue disorders: Secondary | ICD-10-CM | POA: Diagnosis present

## 2019-10-13 MED ORDER — ACEBUTOLOL HCL 400 MG PO CAPS: 400.0000 mg | ORAL_CAPSULE | Freq: Two times a day (BID) | ORAL | 3 refills | Status: DC

## 2019-10-13 NOTE — Addendum Note (Signed)
Addended by: Barbarann Ehlers A on: 10/13/2019 10:20 AM   Modules accepted: Orders

## 2019-10-18 ENCOUNTER — Ambulatory Visit (INDEPENDENT_AMBULATORY_CARE_PROVIDER_SITE_OTHER): Payer: Managed Care, Other (non HMO)

## 2019-10-18 DIAGNOSIS — R002 Palpitations: Secondary | ICD-10-CM

## 2019-10-27 ENCOUNTER — Encounter: Payer: Self-pay | Admitting: Internal Medicine

## 2019-11-18 ENCOUNTER — Encounter: Payer: Self-pay | Admitting: Internal Medicine

## 2019-11-18 ENCOUNTER — Telehealth: Payer: Self-pay | Admitting: Internal Medicine

## 2019-11-18 ENCOUNTER — Ambulatory Visit: Payer: Managed Care, Other (non HMO) | Admitting: Nurse Practitioner

## 2019-11-18 NOTE — Progress Notes (Deleted)
Referring Provider: [name], MD Primary Care Physician:  [name], MD Primary GI:  Dr. Gala Romney  No chief complaint on file.   HPI:   Bradley Miles is a 53 y.o. male who presents for follow-up on cirrhosis. The patient was last seen in our office 01/19/5620 for alcoholic cirrhosis, hepatic encephalopathy, abnormal LFTs, right upper quadrant pain, hepatic steatosis.  Previously overall felt eatly cirrhosis with Meatvir score previously F0/F1 but previous EGD in February 2020 noted consistent with portal hypertension.  Imaging in 2019 with no splenomegaly or outright cirrhosis.  The patient 1 point had elevated AFP at 6.7 in 2019 but declined MRI imaging is recommended.  Episodic hepatic encephalopathy started on lactulose 15 cc 3 times a day as well as Harvoni.  Previously wanting to go back to work but advised to stay out due to confusion.  Previously noted wants to stop drinking but declined inpatient rehab at [facility] although he is interested in outpatient counseling.  Previously stopped alcohol for 3 months after his July 2019 hospitalization.  Alcoholism history with drinking a gallon of whiskey over the course of 3 days.  No DTs during 9-day abstinence, using Ativan 4-6 times a day.  Previous alcohol intake was a pint of liquor.  Peak LFT elevation with bilirubin 5.4, AST/ALT at 2:1 ratio.  Previously discussed need for alcohol cessation, 2 g sodium diet.  Previously ordered viral hepatitis panels, iron, TIBC, CMP, INR, CBC not completed.  However on 06/01/2018 was found to be negative hepatitis C, negative hepatitis B surface antigen and core antibody, negative hepatitis A IgM.  Right upper quadrant ultrasound 08/06/2019 (just prior to his last visit) mild hepatic steatosis with hepatosplenomegaly, cholelithiasis without evidence of acute cholecystitis or biliary ductal dilation, no focal hepatic lesions.  At his last visit he was accompanied by his girlfriend due to  "had another episode today of confusion and he will not remember the visit her to be able to answer".  No alcohol in 3 weeks, gaining weight because he is eating rather than drinking.  Occasionally forgets "middle dose" of lactulose and his girlfriend decided she would start calling him in the middle of the day from work to advise him to take his second dose.  Some intermittent forgetfulness and confusion, still on lactulose with 2-3 stools even when he misses the midday dose.  Statement he forgot to have his recent labs.  Some right upper quadrant and epigastric discomfort, on Dexilant and as needed famotidine.  I helped him identify his previous counselor that he had a good relationship with and he indicated he would call to schedule an appointment for outpatient therapy.  Occasional tremors.  No other overt GI or hepatic symptoms.  Swelling significantly improved on diuretics.  Commended updated labs, Zofran refill sent to the pharmacy, reinforced 2 g sodium diet, intake tracking for sodium intake calculation, strictly avoid all alcohol, continue lactulose, samples of Xifaxan twice daily, Pepcid or over-the-counter Tums as needed for GERD flare, schedule alcohol cessation counseling in [place], follow-up in 2 to 4 weeks.  Call for any worsening symptoms.  Labs completed 08/19/2019 with negative hepatitis C, no evidence of iron overload.  LFTs remain elevated but improved (AST/ALT is 61/26, alkaline phosphatase improved to 160, bilirubin improved to 1.5).  INR normal.  CBC essentially normal other than platelet count that is suppressed at 126 which is an improvement from his previous platelet count of 92.  Recommended hepatitis A/hepatitis B vaccinations, keep  follow-up office visit.  Meld score calculated at 9 assuming early cirrhosis.  Recently saw cardiology 10/09/2019 for leg swelling and on exam noted 1+ pitting edema with mottled appearance on the left lower extremity and trace edema with mottled  appearance of the right lower extremity.  Recommended venous Doppler to rule out DVT.  Also noted venous insufficiency and recommended compression stockings which he chooses not to wear although he has done.  Doppler completed 10/13/2019 which found no evidence of DVT within the left lower extremity.  The patient was a no-show to his office visit 09/10/2019.  Today he states   Past Medical History:  Diagnosis Date  . Anxiety   . Enlarged liver   . GERD (gastroesophageal reflux disease)   . Hypertension   . Palpitations   . PVC's (premature ventricular contractions)   . Shortness of breath   . Varicose veins     Past Surgical History:  Procedure Laterality Date  . BIOPSY  02/02/2019   Procedure: BIOPSY;  Surgeon: Daneil Dolin, MD;  [facility];  Service: Endoscopy;;  gastric  . COLONOSCOPY WITH ESOPHAGOGASTRODUODENOSCOPY (EGD)  12/16/2012   internal hemorrhoids, colonic diverticulosis, benign polyps, screening in 2024. EGD with mild erosive reflux esophagitis, small hiatal hernia, negative H.pylori  . cyst reomved     from spine  . ESOPHAGOGASTRODUODENOSCOPY  05/19/09   RMR: Geographic distal esophageal erosions consistent with severe erosive reflux esophagitis. schatzki's ring s/P dilation/small hiatal hernia otherwise normal stomach  . ESOPHAGOGASTRODUODENOSCOPY (EGD) WITH PROPOFOL N/A 02/02/2019   Dr. Gala Romney: retained gastric contents. portal HTN gastropathy  . WISDOM TOOTH EXTRACTION      Current Outpatient Medications  Medication Sig Dispense Refill  . acebutolol (SECTRAL) 400 MG capsule Take 1 capsule (400 mg total) by mouth 2 (two) times daily. 180 capsule 3  . allopurinol (ZYLOPRIM) 100 MG tablet TAKE 1 TABLET BY MOUTH EVERY DAY 30 tablet 5  . aspirin EC 81 MG tablet Take 81 mg by mouth daily.    . cholecalciferol (VITAMIN D3) 25 MCG (1000 UT) tablet Take 1,000 Units by mouth daily.    Marland Kitchen DEXILANT 60 MG capsule TAKE 1 CAPSULE BY MOUTH EVERY DAY 30 capsule 3  .  famotidine (PEPCID) 10 MG tablet Take 10 mg by mouth as needed for heartburn or indigestion.    . folic acid (FOLVITE) 932 MCG tablet Take 400 mcg by mouth daily.    Marland Kitchen gabapentin (NEURONTIN) 300 MG capsule TAKE 1 CAPSULE BY MOUTH FOUR TIMES DAILY (Patient taking differently: Take 300 mg by mouth 2 (two) times daily. TAKE 1 CAPSULE BY MOUTH FOUR TIMES DAILY) 360 capsule 0  . lactulose, encephalopathy, (CHRONULAC) 10 GM/15ML SOLN Take 15-30cc three times daily to Titrate for 3-4 soft bowel movements a day (Patient taking differently: Take 15-30cc three times daily to Titrate for 3-4 soft bowel movements a day (sometimes forgets mid day dose)) 1892 mL 11  . LORazepam (ATIVAN) 1 MG tablet Take 0.5 tablets (0.5 mg total) by mouth 2 (two) times daily as needed. for anxiety 30 tablet 0  . nortriptyline (PAMELOR) 50 MG capsule TAKE 1 CAPSULE(50 MG) BY MOUTH AT BEDTIME 30 capsule 5  . Omega-3 Fatty Acids (FISH OIL PO) Take 3 capsules by mouth as needed.     . ondansetron (ZOFRAN-ODT) 4 MG disintegrating tablet DISSOLVE 1 TABLET ON THE TONGUE EVERY 8 HOURS AS NEEDED FOR NAUSEA 24 tablet 2  . PARoxetine (PAXIL) 20 MG tablet TAKE 1 TABLET(20 MG) BY  MOUTH DAILY 30 tablet 5  . sildenafil (VIAGRA) 100 MG tablet TAKE 1/2 TABLET BY MOUTH EVERY DAY 3 tablet 3  . vitamin B-12 (CYANOCOBALAMIN) 1000 MCG tablet Take 1,000 mcg by mouth daily.     No current facility-administered medications for this visit.     Allergies as of 11/18/2019  . (No Known Allergies)    Family History  Problem Relation Age of Onset  . Cancer Mother        ?etiology  . Heart disease Mother   . Colon polyps Sister 54       two sisters  . Liver disease Neg Hx     Social History   Socioeconomic History  . Marital status: Married    Spouse name: Not on file  . Number of children: 2  . Years of education: Not on file  . Highest education level: Not on file  Occupational History  . 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 Financial resource strain: Not on file  . Food insecurity    Worry: Not on file    Inability: Not on file  . Transportation needs    Medical: Not on file    Non-medical: Not on file  Tobacco Use  . Smoking status: Current Every Day Smoker    Packs/day: 1.50    Years: 30.00    Pack years: 45.00    Types: Cigarettes    Start date: 12/24/1979  . Smokeless tobacco: Never Used  Substance and Sexual Activity  . Alcohol use: Yes    Alcohol/week: 0.0 standard drinks    Comment: consumes beer or liquor three to four times weekly. Up to six pack or three shots each occurence or more x 20+ yrs.08/19/19 no alcohol past 3 weeks  . Drug use: No  . Sexual activity: Yes    Partners: Female    Birth control/protection: None  Lifestyle  . Physical activity    Days per week: Not on file    Minutes per session: Not on file  . Stress: Not on file  Relationships  . Social Herbalist on phone: Not on file    Gets together: Not on file    Attends religious service: Not on file    Active member of club or organization: Not on file    Attends meetings of clubs or organizations: Not on file    Relationship status: Not on file  Other Topics Concern  . Not on file  Social History Narrative   Lives alone  Girlfriend & kids 1/2 time    Review of Systems: General: Negative for anorexia, weight loss, fever, chills, fatigue, weakness. Eyes: Negative for vision changes.  ENT: Negative for hoarseness, difficulty swallowing , nasal congestion. CV: Negative for chest pain, angina, palpitations, dyspnea on exertion, peripheral edema.  Respiratory: Negative for dyspnea at rest, dyspnea on exertion, cough, sputum, wheezing.  GI: See history of present illness. GU:  Negative for dysuria, hematuria, urinary incontinence, urinary frequency, nocturnal urination.  MS: Negative for joint pain, low back pain.  Derm: Negative for rash or itching.  Neuro: Negative for  weakness, abnormal sensation, seizure, frequent headaches, memory loss, confusion.  Psych: Negative for anxiety, depression, suicidal ideation, hallucinations.  Endo: Negative for unusual weight change.  Heme: Negative for bruising or bleeding. Allergy: Negative for rash or hives.   Physical Exam: There were no vitals taken for this visit. General:   Alert and oriented.  Pleasant and cooperative. Well-nourished and well-developed.  Head:  Normocephalic and atraumatic. Eyes:  Without icterus, sclera clear and conjunctiva pink.  Ears:  Normal auditory acuity. Mouth:  No deformity or lesions, oral mucosa pink.  Throat/Neck:  Supple, without mass or thyromegaly. Cardiovascular:  S1, S2 present without murmurs appreciated. Normal pulses noted. Extremities without clubbing or edema. Respiratory:  Clear to auscultation bilaterally. No wheezes, rales, or rhonchi. No distress.  Gastrointestinal:  +BS, soft, non-tender and non-distended. No HSM noted. No guarding or rebound. No masses appreciated.  Rectal:  Deferred  Musculoskalatal:  Symmetrical without gross deformities. Normal posture. Skin:  Intact without significant lesions or rashes. Neurologic:  Alert and oriented x4;  grossly normal neurologically. Psych:  Alert and cooperative. Normal mood and affect. Heme/Lymph/Immune: No significant cervical adenopathy. No excessive bruising noted.    11/18/2019 9:52 AM   Disclaimer: This note was dictated with voice recognition software. Similar sounding words can inadvertently be transcribed and may not be corrected upon review.

## 2019-11-18 NOTE — Telephone Encounter (Signed)
Patient was a no show and letter sent  °

## 2019-11-19 ENCOUNTER — Inpatient Hospital Stay (HOSPITAL_COMMUNITY)
Admission: EM | Admit: 2019-11-19 | Discharge: 2019-11-29 | DRG: 405 | Disposition: A | Discharge status: Home / Self Care | Payer: Managed Care, Other (non HMO) | Attending: Internal Medicine | Admitting: Internal Medicine

## 2019-11-19 ENCOUNTER — Telehealth: Payer: Self-pay

## 2019-11-19 ENCOUNTER — Other Ambulatory Visit: Payer: Self-pay

## 2019-11-19 ENCOUNTER — Encounter (HOSPITAL_COMMUNITY): Payer: Self-pay | Admitting: Emergency Medicine

## 2019-11-19 DIAGNOSIS — Z9119 Patient's noncompliance with other medical treatment and regimen: Secondary | ICD-10-CM

## 2019-11-19 DIAGNOSIS — D6959 Other secondary thrombocytopenia: Secondary | ICD-10-CM | POA: Diagnosis not present

## 2019-11-19 DIAGNOSIS — K7031 Alcoholic cirrhosis of liver with ascites: Secondary | ICD-10-CM

## 2019-11-19 DIAGNOSIS — E861 Hypovolemia: Secondary | ICD-10-CM | POA: Diagnosis not present

## 2019-11-19 DIAGNOSIS — Z8371 Family history of colonic polyps: Secondary | ICD-10-CM

## 2019-11-19 DIAGNOSIS — R0689 Other abnormalities of breathing: Secondary | ICD-10-CM

## 2019-11-19 DIAGNOSIS — K920 Hematemesis: Secondary | ICD-10-CM | POA: Diagnosis present

## 2019-11-19 DIAGNOSIS — D62 Acute posthemorrhagic anemia: Secondary | ICD-10-CM

## 2019-11-19 DIAGNOSIS — F1020 Alcohol dependence, uncomplicated: Secondary | ICD-10-CM

## 2019-11-19 DIAGNOSIS — F17210 Nicotine dependence, cigarettes, uncomplicated: Secondary | ICD-10-CM | POA: Diagnosis present

## 2019-11-19 DIAGNOSIS — R58 Hemorrhage, not elsewhere classified: Secondary | ICD-10-CM

## 2019-11-19 DIAGNOSIS — Y900 Blood alcohol level of less than 20 mg/100 ml: Secondary | ICD-10-CM | POA: Diagnosis present

## 2019-11-19 DIAGNOSIS — I864 Gastric varices: Secondary | ICD-10-CM

## 2019-11-19 DIAGNOSIS — R578 Other shock: Secondary | ICD-10-CM | POA: Diagnosis not present

## 2019-11-19 DIAGNOSIS — R739 Hyperglycemia, unspecified: Secondary | ICD-10-CM | POA: Diagnosis not present

## 2019-11-19 DIAGNOSIS — I10 Essential (primary) hypertension: Secondary | ICD-10-CM | POA: Diagnosis present

## 2019-11-19 DIAGNOSIS — K3189 Other diseases of stomach and duodenum: Secondary | ICD-10-CM | POA: Diagnosis present

## 2019-11-19 DIAGNOSIS — F329 Major depressive disorder, single episode, unspecified: Secondary | ICD-10-CM | POA: Diagnosis present

## 2019-11-19 DIAGNOSIS — R0902 Hypoxemia: Secondary | ICD-10-CM

## 2019-11-19 DIAGNOSIS — K7011 Alcoholic hepatitis with ascites: Secondary | ICD-10-CM | POA: Diagnosis present

## 2019-11-19 DIAGNOSIS — Z8249 Family history of ischemic heart disease and other diseases of the circulatory system: Secondary | ICD-10-CM

## 2019-11-19 DIAGNOSIS — Z7982 Long term (current) use of aspirin: Secondary | ICD-10-CM

## 2019-11-19 DIAGNOSIS — I8501 Esophageal varices with bleeding: Secondary | ICD-10-CM

## 2019-11-19 DIAGNOSIS — K760 Fatty (change of) liver, not elsewhere classified: Secondary | ICD-10-CM | POA: Diagnosis present

## 2019-11-19 DIAGNOSIS — E782 Mixed hyperlipidemia: Secondary | ICD-10-CM | POA: Diagnosis present

## 2019-11-19 DIAGNOSIS — R0989 Other specified symptoms and signs involving the circulatory and respiratory systems: Secondary | ICD-10-CM

## 2019-11-19 DIAGNOSIS — G621 Alcoholic polyneuropathy: Secondary | ICD-10-CM | POA: Diagnosis present

## 2019-11-19 DIAGNOSIS — F419 Anxiety disorder, unspecified: Secondary | ICD-10-CM | POA: Diagnosis present

## 2019-11-19 DIAGNOSIS — Z9114 Patient's other noncompliance with medication regimen: Secondary | ICD-10-CM

## 2019-11-19 DIAGNOSIS — Z452 Encounter for adjustment and management of vascular access device: Secondary | ICD-10-CM

## 2019-11-19 DIAGNOSIS — I8511 Secondary esophageal varices with bleeding: Secondary | ICD-10-CM | POA: Diagnosis present

## 2019-11-19 DIAGNOSIS — K2211 Ulcer of esophagus with bleeding: Secondary | ICD-10-CM | POA: Diagnosis present

## 2019-11-19 DIAGNOSIS — Z809 Family history of malignant neoplasm, unspecified: Secondary | ICD-10-CM

## 2019-11-19 DIAGNOSIS — Z6834 Body mass index (BMI) 34.0-34.9, adult: Secondary | ICD-10-CM

## 2019-11-19 DIAGNOSIS — E669 Obesity, unspecified: Secondary | ICD-10-CM | POA: Diagnosis present

## 2019-11-19 DIAGNOSIS — J449 Chronic obstructive pulmonary disease, unspecified: Secondary | ICD-10-CM | POA: Diagnosis present

## 2019-11-19 DIAGNOSIS — I8500 Esophageal varices without bleeding: Secondary | ICD-10-CM

## 2019-11-19 DIAGNOSIS — F1010 Alcohol abuse, uncomplicated: Secondary | ICD-10-CM

## 2019-11-19 DIAGNOSIS — K766 Portal hypertension: Secondary | ICD-10-CM | POA: Diagnosis present

## 2019-11-19 DIAGNOSIS — Z20828 Contact with and (suspected) exposure to other viral communicable diseases: Secondary | ICD-10-CM | POA: Diagnosis present

## 2019-11-19 DIAGNOSIS — Z86718 Personal history of other venous thrombosis and embolism: Secondary | ICD-10-CM

## 2019-11-19 LAB — CBC
HCT: 43.4 % (ref 39.0–52.0)
Hemoglobin: 14.9 g/dL (ref 13.0–17.0)
MCH: 36.5 pg — ABNORMAL HIGH (ref 26.0–34.0)
MCHC: 34.3 g/dL (ref 30.0–36.0)
MCV: 106.4 fL — ABNORMAL HIGH (ref 80.0–100.0)
Platelets: 94 10*3/uL — ABNORMAL LOW (ref 150–400)
RBC: 4.08 MIL/uL — ABNORMAL LOW (ref 4.22–5.81)
RDW: 17.3 % — ABNORMAL HIGH (ref 11.5–15.5)
WBC: 6.6 10*3/uL (ref 4.0–10.5)
nRBC: 0 % (ref 0.0–0.2)

## 2019-11-19 LAB — COMPREHENSIVE METABOLIC PANEL
ALT: 39 U/L (ref 0–44)
AST: 133 U/L — ABNORMAL HIGH (ref 15–41)
Albumin: 3.1 g/dL — ABNORMAL LOW (ref 3.5–5.0)
Alkaline Phosphatase: 254 U/L — ABNORMAL HIGH (ref 38–126)
Anion gap: 13 (ref 5–15)
BUN: 11 mg/dL (ref 6–20)
CO2: 29 mmol/L (ref 22–32)
Calcium: 9.4 mg/dL (ref 8.9–10.3)
Chloride: 99 mmol/L (ref 98–111)
Creatinine, Ser: 0.71 mg/dL (ref 0.61–1.24)
GFR calc Af Amer: >60 mL/min (ref >60)
GFR calc non Af Amer: >60 mL/min (ref >60)
Glucose, Bld: 133 mg/dL — ABNORMAL HIGH (ref 70–99)
Potassium: 4 mmol/L (ref 3.5–5.1)
Sodium: 141 mmol/L (ref 135–145)
Total Bilirubin: 8.8 mg/dL — ABNORMAL HIGH (ref 0.3–1.2)
Total Protein: 8.3 g/dL — ABNORMAL HIGH (ref 6.5–8.1)

## 2019-11-19 LAB — PROTIME-INR
INR: 1.3 — ABNORMAL HIGH (ref 0.8–1.2)
Prothrombin Time: 16 seconds — ABNORMAL HIGH (ref 11.4–15.2)

## 2019-11-19 LAB — ETHANOL: Alcohol, Ethyl (B): <10 mg/dL (ref <10)

## 2019-11-19 LAB — APTT: aPTT: 41 seconds — ABNORMAL HIGH (ref 24–36)

## 2019-11-19 MED ORDER — THIAMINE HCL 100 MG/ML IJ SOLN
100.0000 mg | Freq: Every day | INTRAMUSCULAR | Status: DC
  Administered 2019-11-19: 100 mg via INTRAVENOUS
  Filled 2019-11-19: qty 2

## 2019-11-19 MED ORDER — LORAZEPAM 1 MG PO TABS: 0.0000 mg | ORAL_TABLET | Freq: Two times a day (BID) | ORAL | Status: DC

## 2019-11-19 MED ORDER — SODIUM CHLORIDE 0.9 % IV BOLUS
1000.0000 mL | Freq: Once | INTRAVENOUS | Status: AC
  Administered 2019-11-19: 23:00:00 1000 mL via INTRAVENOUS

## 2019-11-19 MED ORDER — ONDANSETRON HCL 4 MG/2ML IJ SOLN: 4.0000 mg | Freq: Once | INTRAMUSCULAR | Status: DC

## 2019-11-19 MED ORDER — PANTOPRAZOLE SODIUM 40 MG IV SOLR
40.0000 mg | Freq: Once | INTRAVENOUS | Status: AC
  Administered 2019-11-19: 23:00:00 40 mg via INTRAVENOUS
  Filled 2019-11-19: qty 40

## 2019-11-19 MED ORDER — VITAMIN B-1 100 MG PO TABS: 100.0000 mg | ORAL_TABLET | Freq: Every day | ORAL | Status: DC

## 2019-11-19 MED ORDER — SODIUM CHLORIDE 0.9 % IV SOLN
25.0000 ug/h | INTRAVENOUS | Status: DC
  Administered 2019-11-20: 25 ug/h via INTRAVENOUS
  Filled 2019-11-19 (×5): qty 1

## 2019-11-19 MED ORDER — LORAZEPAM 2 MG/ML IJ SOLN: 0.0000 mg | Freq: Two times a day (BID) | INTRAMUSCULAR | Status: DC

## 2019-11-19 MED ORDER — ONDANSETRON HCL 4 MG/2ML IJ SOLN
4.0000 mg | Freq: Once | INTRAMUSCULAR | Status: AC
  Administered 2019-11-19: 4 mg via INTRAVENOUS
  Filled 2019-11-19: qty 2

## 2019-11-19 MED ORDER — LORAZEPAM 1 MG PO TABS: 0.0000 mg | ORAL_TABLET | Freq: Four times a day (QID) | ORAL | Status: DC

## 2019-11-19 MED ORDER — LORAZEPAM 2 MG/ML IJ SOLN
0.0000 mg | Freq: Four times a day (QID) | INTRAMUSCULAR | Status: DC
  Administered 2019-11-19: 2 mg via INTRAVENOUS
  Filled 2019-11-19: qty 1

## 2019-11-19 NOTE — Telephone Encounter (Signed)
Pt missed an appointment yesterday due to drinking yesterday. Pt isn't feeling well today and vomited 5 times this morning and is having throat burning. Pts spouse states the vomit is brown and looks like coffee grains. They have r/s pts appointment for next week 11/26/2019 and are wondering if pt should go to the ED. They were advised if pt can't keep any liquids down, he should go to the ED.

## 2019-11-19 NOTE — ED Triage Notes (Signed)
Patient began vomiting 36 hours ago and reports that it is coffee ground in appearance. Denies bloody stools.

## 2019-11-19 NOTE — ED Notes (Signed)
Jewelry given to wife.

## 2019-11-19 NOTE — Telephone Encounter (Signed)
Noted. Pt and spouse are aware that he should be evaluated by the ED.

## 2019-11-19 NOTE — ED Provider Notes (Signed)
[facility] EMERGENCY DEPARTMENT Provider Note   CSN: [id number] Arrival date & time: 11/19/19  2020     History Chief Complaint  Patient presents with  . Hematemesis    Bradley Miles is a 53 y.o. male with history of ETOH abuse and portal HTN who presents with hematemesis. Pt reports that he woke up this morning and was nauseous and has been having hematemsis ever since. His wife states it started out as a lighter tea color but throughout the day it has become dark and looks like coffee grounds. He has had about 15 episodes and 2 episodes since he's been in the ED. He has been off of his meds because his wife states he is "non-compliant". He drinks ~1/5 daily but sometimes more. His last drink was last night around 9PM. He denies having a drink today. He has not taken any NSAIDs. He denies fever, chest pain, SOB, abdominal pain. No blood in the stool. He has not had this before. He sees Dr. Gala Romney with GI and had a EGD in Feb 2020 which showed portal HTN.  HPI     Past Medical History:  Diagnosis Date  . Anxiety   . Enlarged liver   . GERD (gastroesophageal reflux disease)   . Hypertension   . Palpitations   . PVC's (premature ventricular contractions)   . Shortness of breath   . Varicose veins     Patient Active Problem List   Diagnosis Date Noted  . RUQ pain 07/30/2019  . Abnormal LFTs 07/30/2019  . Bilateral leg edema 06/03/2019  . Portal hypertension ([place]) 11/26/2018  . Hepatic steatosis 06/27/2018  . Abdominal pain   . Alcoholic hepatitis without ascites   . Hepatic encephalopathy ([place]) 06/01/2018  . Acute respiratory failure with hypoxia ([place]) 06/01/2018  . Alcoholic cirrhosis of liver with ascites ([place]) 06/01/2018  . COPD with acute exacerbation ([place]) 06/01/2018  . 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 Increased ammonia level   . COPD, mild ([place]) 05/14/2017  . Right leg claudication ([place]) 05/14/2017  . Prediabetes 02/04/2017  . Chest pain  12/21/2015  . Right leg DVT ([place]) 07/18/2015  . Elevated transaminase measurement 07/18/2015  . Sensory neuropathy 07/16/2015  . Anxiety 12/23/2014  . EtOH dependence ([place]) 02/09/2014  . Headache in back of head 01/12/2014  . Superficial thrombosis of lower extremity 07/01/2013  . Hematochezia 11/17/2012  . Mixed hyperlipidemia 02/15/2012  . Hypertension 01/18/2012  . Insomnia 01/18/2012  . Family history of colonic polyps 11/15/2011  . Palpitations 10/04/2011  . TOBACCO ABUSE 05/18/2010  . GERD 05/18/2010  . CONSTIPATION 05/18/2010  . 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 DYSPHAGIA UNSPECIFIED 05/18/2010  . ABDOMINAL PAIN, RIGHT LOWER QUADRANT 05/18/2010    Past Surgical History:  Procedure Laterality Date  . BIOPSY  02/02/2019   Procedure: BIOPSY;  Surgeon: Daneil Dolin, MD;  Location: [facility];  Service: Endoscopy;;  gastric  . COLONOSCOPY WITH ESOPHAGOGASTRODUODENOSCOPY (EGD)  12/16/2012   internal hemorrhoids, colonic diverticulosis, benign polyps, screening in 2024. EGD with mild erosive reflux esophagitis, small hiatal hernia, negative H.pylori  . cyst reomved     from spine  . ESOPHAGOGASTRODUODENOSCOPY  05/19/09   RMR: Geographic distal esophageal erosions consistent with severe erosive reflux esophagitis. schatzki's ring s/P dilation/small hiatal hernia otherwise normal stomach  . ESOPHAGOGASTRODUODENOSCOPY (EGD) WITH PROPOFOL N/A 02/02/2019   Dr. Gala Romney: retained gastric contents. portal HTN gastropathy  . WISDOM TOOTH EXTRACTION  Family History  Problem Relation Age of Onset  . Cancer Mother        ?etiology  . Heart disease Mother   . Colon polyps Sister 58       two sisters  . Liver disease Neg Hx     Social History   Tobacco Use  . Smoking status: Current Every Day Smoker    Packs/day: 1.50    Years: 30.00    Pack years: 45.00    Types: Cigarettes    Start date: 12/24/1979  . Smokeless tobacco: Never Used  Substance Use Topics    . Alcohol use: Yes    Alcohol/week: 0.0 standard drinks    Comment: consumes beer or liquor three to four times weekly. Up to six pack or three shots each occurence or more x 20+ yrs.08/19/19 no alcohol past 3 weeks  . Drug use: No    Home Medications Prior to Admission medications   Medication Sig Start Date End Date Taking? Authorizing Provider  acebutolol (SECTRAL) 400 MG capsule Take 1 capsule (400 mg total) by mouth 2 (two) times daily. 10/13/19   Herminio Commons, MD  allopurinol (ZYLOPRIM) 100 MG tablet TAKE 1 TABLET BY MOUTH EVERY DAY 07/13/19   [name], MD  aspirin EC 81 MG tablet Take 81 mg by mouth daily.    [provider]  cholecalciferol (VITAMIN D3) 25 MCG (1000 UT) tablet Take 1,000 Units by mouth daily.    [provider]  DEXILANT 60 MG capsule TAKE 1 CAPSULE BY MOUTH EVERY DAY 06/18/19   [name], MD  famotidine (PEPCID) 10 MG tablet Take 10 mg by mouth as needed for heartburn or indigestion.    [provider]  folic acid (FOLVITE) 030 MCG tablet Take 400 mcg by mouth daily.    [provider]  gabapentin (NEURONTIN) 300 MG capsule TAKE 1 CAPSULE BY MOUTH FOUR TIMES DAILY Patient taking differently: Take 300 mg by mouth 2 (two) times daily. TAKE 1 CAPSULE BY MOUTH FOUR TIMES DAILY 08/24/19   [name], MD  lactulose, encephalopathy, (CHRONULAC) 10 GM/15ML SOLN Take 15-30cc three times daily to Titrate for 3-4 soft bowel movements a day Patient taking differently: Take 15-30cc three times daily to Titrate for 3-4 soft bowel movements a day (sometimes forgets mid day dose) 08/04/19   Mahala Menghini, PA-C  LORazepam (ATIVAN) 1 MG tablet Take 0.5 tablets (0.5 mg total) by mouth 2 (two) times daily as needed. for anxiety 07/29/19   [name], MD  nortriptyline (PAMELOR) 50 MG capsule TAKE 1 CAPSULE(50 MG) BY MOUTH AT BEDTIME 03/23/19   [name], MD  Omega-3 Fatty Acids (FISH OIL PO) Take 3 capsules by  mouth as needed.     [provider]  ondansetron (ZOFRAN-ODT) 4 MG disintegrating tablet DISSOLVE 1 TABLET ON THE TONGUE EVERY 8 HOURS AS NEEDED FOR NAUSEA 08/19/19   Carlis Stable, NP  PARoxetine (PAXIL) 20 MG tablet TAKE 1 TABLET(20 MG) BY MOUTH DAILY 09/22/19   [name], MD  sildenafil (VIAGRA) 100 MG tablet TAKE 1/2 TABLET BY MOUTH EVERY DAY 06/18/19   [name], MD  vitamin B-12 (CYANOCOBALAMIN) 1000 MCG tablet Take 1,000 mcg by mouth daily.    [provider]    Allergies    Patient has no known allergies.  Review of Systems   Review of Systems  Constitutional: Negative for fever.  Respiratory: Negative for shortness of breath.  Cardiovascular: Negative for chest pain.  Gastrointestinal: Positive for nausea and vomiting. Negative for abdominal pain, blood in stool, constipation and diarrhea.  Hematological: Bruises/bleeds easily.  All other systems reviewed and are negative.   Physical Exam Updated Vital Signs BP (!) 147/80 (BP Location: Right Arm)   Pulse 93   Temp 98.4 F (36.9 C) (Oral)   Resp 16   Ht 6\' 1"  (1.854 m)   Wt 117.9 kg   SpO2 97%   BMI 34.30 kg/m   Physical Exam Vitals and nursing note reviewed.  Constitutional:      General: He is not in acute distress.    Appearance: He is well-developed. He is obese. He is not ill-appearing.  HENT:     Head: Normocephalic and atraumatic.  Eyes:     General: No scleral icterus.       Right eye: No discharge.        Left eye: No discharge.     Conjunctiva/sclera: Conjunctivae normal.     Pupils: Pupils are equal, round, and reactive to light.  Cardiovascular:     Rate and Rhythm: Normal rate.  Pulmonary:     Effort: Pulmonary effort is normal. No respiratory distress.  Abdominal:     General: There is no distension.  Musculoskeletal:     Cervical back: Normal range of motion.  Skin:    General: Skin is warm and dry.  Neurological:     Mental Status: He is alert and oriented  to person, place, and time.  Psychiatric:        Behavior: Behavior normal.     ED Results / Procedures / Treatments   Labs (all labs ordered are listed, but only abnormal results are displayed) Labs Reviewed  CBC - Abnormal; Notable for the following components:      Result Value   RBC 4.08 (*)    MCV 106.4 (*)    MCH 36.5 (*)    RDW 17.3 (*)    Platelets 94 (*)    All other components within normal limits  COMPREHENSIVE METABOLIC PANEL - Abnormal; Notable for the following components:   Glucose, Bld 133 (*)    Total Protein 8.3 (*)    Albumin 3.1 (*)    AST 133 (*)    Alkaline Phosphatase 254 (*)    Total Bilirubin 8.8 (*)    All other components within normal limits  PROTIME-INR - Abnormal; Notable for the following components:   Prothrombin Time 16.0 (*)    INR 1.3 (*)    All other components within normal limits  APTT - Abnormal; Notable for the following components:   aPTT 41 (*)    All other components within normal limits  OCCULT BLOOD GASTRIC / DUODENUM (SPECIMEN CUP) - Abnormal; Notable for the following components:   Occult Blood, Gastric POSITIVE (*)    All other components within normal limits  SARS CORONAVIRUS 2 (TAT 6-24 HRS)  ETHANOL  TYPE AND SCREEN    EKG None  Radiology No results found.  CRITICAL CARE Performed by: Recardo Evangelist   Total critical care time: 35 minutes  Critical care time was exclusive of separately billable procedures and treating other patients.  Critical care was necessary to treat or prevent imminent or life-threatening deterioration.  Critical care was time spent personally by me on the following activities: development of treatment plan with patient and/or surrogate as well as nursing, discussions with consultants, evaluation of patient's response to treatment, examination of patient, obtaining  history from patient or surrogate, ordering and performing treatments and interventions, ordering and review of laboratory  studies, ordering and review of radiographic studies, pulse oximetry and re-evaluation of patient's condition.  Procedures Procedures (including critical care time)  Medications Ordered in ED Medications  LORazepam (ATIVAN) injection 0-4 mg (2 mg Intravenous Given 11/19/19 2331)    Or  LORazepam (ATIVAN) tablet 0-4 mg ( Oral See Alternative 11/19/19 2331)  LORazepam (ATIVAN) injection 0-4 mg (has no administration in time range)    Or  LORazepam (ATIVAN) tablet 0-4 mg (has no administration in time range)  thiamine (VITAMIN B-1) tablet 100 mg ( Oral See Alternative 11/19/19 2326)    Or  thiamine (B-1) injection 100 mg (100 mg Intravenous Given 11/19/19 2326)  octreotide (SANDOSTATIN) 500 mcg in sodium chloride 0.9 % 250 mL (2 mcg/mL) infusion (25 mcg/hr Intravenous New Bag/Given 11/20/19 0100)  pantoprazole (PROTONIX) injection 40 mg (40 mg Intravenous Given 11/19/19 2325)  sodium chloride 0.9 % bolus 1,000 mL (0 mLs Intravenous Stopped 11/20/19 0015)  ondansetron (ZOFRAN) injection 4 mg (4 mg Intravenous Given 11/19/19 2332)    ED Course  I have reviewed the triage vital signs and the nursing notes.  Pertinent labs & imaging results that were available during my care of the patient were reviewed by me and considered in my medical decision making (see chart for details).    MDM Rules/Calculators/A&P  53 year old male presents with recurrent hematemesis since this morning.  He has a history of alcohol abuse and known portal hypertension.  Last drink was last night.  He is hemodynamically stable.  Abdomen is soft and nontender.  He has had 2 episodes of emesis since being in the ED and his wife shows me with coffee-ground emesis at bedside.  Will obtain labs and give fluids, Zofran, Protonix.  Will discuss with GI  CBC is remarkable for normal hemoglobin with elevated MCV.  CMP is remarkable for elevated alkaline phosphatase, mildly elevated AST, normal ALT, and significantly elevated  bilirubin (8.8).  INR is 1.3.  APTT is 41.  Discussed with Dr. Gala Romney with GI who recommends octreotide infusion and have patient be n.p.o. and they will see in the morning.  Covid test ordered.  Discussed with Dr. Darrick Meigs who will admit     Final Clinical Impression(s) / ED Diagnoses Final diagnoses:  Hematemesis with nausea  ETOH abuse    Rx / DC Orders ED Discharge Orders    None       Recardo Evangelist, PA-C 11/20/19 0127    Fredia Sorrow, MD 11/23/19 2315

## 2019-11-19 NOTE — Telephone Encounter (Signed)
Yes, go to the ED if he's having "coffee ground emesis". May be bleeding from esophagus/stomach.

## 2019-11-20 ENCOUNTER — Inpatient Hospital Stay (HOSPITAL_COMMUNITY): Payer: Managed Care, Other (non HMO) | Admitting: Certified Registered Nurse Anesthetist

## 2019-11-20 ENCOUNTER — Encounter (HOSPITAL_COMMUNITY): Payer: Self-pay | Admitting: Family Medicine

## 2019-11-20 ENCOUNTER — Encounter (HOSPITAL_COMMUNITY)
Admission: EM | Disposition: A | Discharge status: Home / Self Care | Payer: Self-pay | Source: Home / Self Care | Attending: Pulmonary Disease

## 2019-11-20 ENCOUNTER — Other Ambulatory Visit: Payer: Self-pay

## 2019-11-20 DIAGNOSIS — F1010 Alcohol abuse, uncomplicated: Secondary | ICD-10-CM | POA: Diagnosis not present

## 2019-11-20 DIAGNOSIS — Z9119 Patient's noncompliance with other medical treatment and regimen: Secondary | ICD-10-CM | POA: Diagnosis not present

## 2019-11-20 DIAGNOSIS — K7031 Alcoholic cirrhosis of liver with ascites: Secondary | ICD-10-CM | POA: Diagnosis present

## 2019-11-20 DIAGNOSIS — K2211 Ulcer of esophagus with bleeding: Secondary | ICD-10-CM | POA: Diagnosis present

## 2019-11-20 DIAGNOSIS — Z9114 Patient's other noncompliance with medication regimen: Secondary | ICD-10-CM | POA: Diagnosis not present

## 2019-11-20 DIAGNOSIS — K2100 Gastro-esophageal reflux disease with esophagitis, without bleeding: Secondary | ICD-10-CM

## 2019-11-20 DIAGNOSIS — I8501 Esophageal varices with bleeding: Secondary | ICD-10-CM | POA: Diagnosis present

## 2019-11-20 DIAGNOSIS — E782 Mixed hyperlipidemia: Secondary | ICD-10-CM | POA: Diagnosis present

## 2019-11-20 DIAGNOSIS — F17210 Nicotine dependence, cigarettes, uncomplicated: Secondary | ICD-10-CM | POA: Diagnosis present

## 2019-11-20 DIAGNOSIS — R578 Other shock: Secondary | ICD-10-CM | POA: Diagnosis not present

## 2019-11-20 DIAGNOSIS — K920 Hematemesis: Secondary | ICD-10-CM | POA: Diagnosis present

## 2019-11-20 DIAGNOSIS — G621 Alcoholic polyneuropathy: Secondary | ICD-10-CM | POA: Diagnosis present

## 2019-11-20 DIAGNOSIS — K766 Portal hypertension: Secondary | ICD-10-CM | POA: Diagnosis present

## 2019-11-20 DIAGNOSIS — I8511 Secondary esophageal varices with bleeding: Secondary | ICD-10-CM | POA: Diagnosis present

## 2019-11-20 DIAGNOSIS — J449 Chronic obstructive pulmonary disease, unspecified: Secondary | ICD-10-CM | POA: Diagnosis present

## 2019-11-20 DIAGNOSIS — Y900 Blood alcohol level of less than 20 mg/100 ml: Secondary | ICD-10-CM | POA: Diagnosis present

## 2019-11-20 DIAGNOSIS — D62 Acute posthemorrhagic anemia: Secondary | ICD-10-CM | POA: Diagnosis present

## 2019-11-20 DIAGNOSIS — F419 Anxiety disorder, unspecified: Secondary | ICD-10-CM | POA: Diagnosis present

## 2019-11-20 DIAGNOSIS — Z7982 Long term (current) use of aspirin: Secondary | ICD-10-CM | POA: Diagnosis not present

## 2019-11-20 DIAGNOSIS — Z20828 Contact with and (suspected) exposure to other viral communicable diseases: Secondary | ICD-10-CM | POA: Diagnosis present

## 2019-11-20 DIAGNOSIS — F329 Major depressive disorder, single episode, unspecified: Secondary | ICD-10-CM | POA: Diagnosis present

## 2019-11-20 DIAGNOSIS — Z8249 Family history of ischemic heart disease and other diseases of the circulatory system: Secondary | ICD-10-CM | POA: Diagnosis not present

## 2019-11-20 DIAGNOSIS — Z8371 Family history of colonic polyps: Secondary | ICD-10-CM | POA: Diagnosis not present

## 2019-11-20 DIAGNOSIS — Z809 Family history of malignant neoplasm, unspecified: Secondary | ICD-10-CM | POA: Diagnosis not present

## 2019-11-20 DIAGNOSIS — I864 Gastric varices: Secondary | ICD-10-CM | POA: Diagnosis not present

## 2019-11-20 DIAGNOSIS — Z86718 Personal history of other venous thrombosis and embolism: Secondary | ICD-10-CM | POA: Diagnosis not present

## 2019-11-20 DIAGNOSIS — K7011 Alcoholic hepatitis with ascites: Secondary | ICD-10-CM | POA: Diagnosis present

## 2019-11-20 DIAGNOSIS — K760 Fatty (change of) liver, not elsewhere classified: Secondary | ICD-10-CM | POA: Diagnosis present

## 2019-11-20 DIAGNOSIS — I10 Essential (primary) hypertension: Secondary | ICD-10-CM | POA: Diagnosis present

## 2019-11-20 HISTORY — PX: ESOPHAGOGASTRODUODENOSCOPY (EGD) WITH PROPOFOL: SHX5813

## 2019-11-20 LAB — HIV ANTIBODY (ROUTINE TESTING W REFLEX): HIV Screen 4th Generation wRfx: NONREACTIVE

## 2019-11-20 LAB — OCCULT BLOOD GASTRIC / DUODENUM (SPECIMEN CUP)
Occult Blood, Gastric: POSITIVE — AB
pH, Gastric: 4

## 2019-11-20 LAB — CBC
HCT: 37.8 % — ABNORMAL LOW (ref 39.0–52.0)
Hemoglobin: 12.6 g/dL — ABNORMAL LOW (ref 13.0–17.0)
MCH: 36.2 pg — ABNORMAL HIGH (ref 26.0–34.0)
MCHC: 33.3 g/dL (ref 30.0–36.0)
MCV: 108.6 fL — ABNORMAL HIGH (ref 80.0–100.0)
Platelets: 77 10*3/uL — ABNORMAL LOW (ref 150–400)
RBC: 3.48 MIL/uL — ABNORMAL LOW (ref 4.22–5.81)
RDW: 17.2 % — ABNORMAL HIGH (ref 11.5–15.5)
WBC: 6.2 10*3/uL (ref 4.0–10.5)
nRBC: 0 % (ref 0.0–0.2)

## 2019-11-20 LAB — SARS CORONAVIRUS 2 BY RT PCR (HOSPITAL ORDER, PERFORMED IN ~~LOC~~ HOSPITAL LAB): SARS Coronavirus 2: NEGATIVE

## 2019-11-20 LAB — PHOSPHORUS: Phosphorus: 4.2 mg/dL (ref 2.5–4.6)

## 2019-11-20 LAB — MAGNESIUM: Magnesium: 1.2 mg/dL — ABNORMAL LOW (ref 1.7–2.4)

## 2019-11-20 SURGERY — ESOPHAGOGASTRODUODENOSCOPY (EGD) WITH PROPOFOL
Anesthesia: General

## 2019-11-20 MED ORDER — THIAMINE HCL 100 MG/ML IJ SOLN
100.0000 mg | Freq: Every day | INTRAMUSCULAR | Status: DC
  Administered 2019-11-21 – 2019-11-25 (×4): 100 mg via INTRAVENOUS
  Filled 2019-11-20 (×5): qty 2

## 2019-11-20 MED ORDER — PANTOPRAZOLE SODIUM 40 MG IV SOLR
INTRAVENOUS | Status: AC
  Filled 2019-11-20: qty 80

## 2019-11-20 MED ORDER — LIDOCAINE 2% (20 MG/ML) 5 ML SYRINGE
INTRAMUSCULAR | Status: AC
  Filled 2019-11-20: qty 15

## 2019-11-20 MED ORDER — PROPOFOL 10 MG/ML IV BOLUS
INTRAVENOUS | Status: DC | PRN
  Administered 2019-11-20: 200 mg via INTRAVENOUS

## 2019-11-20 MED ORDER — EPHEDRINE 5 MG/ML INJ
INTRAVENOUS | Status: AC
  Filled 2019-11-20: qty 10

## 2019-11-20 MED ORDER — LABETALOL HCL 5 MG/ML IV SOLN
INTRAVENOUS | Status: AC
  Filled 2019-11-20: qty 4

## 2019-11-20 MED ORDER — ACETAMINOPHEN 650 MG RE SUPP: 650.0000 mg | Freq: Four times a day (QID) | RECTAL | Status: DC | PRN

## 2019-11-20 MED ORDER — ARTIFICIAL TEARS OPHTHALMIC OINT
TOPICAL_OINTMENT | OPHTHALMIC | Status: AC
  Filled 2019-11-20: qty 7

## 2019-11-20 MED ORDER — ACETAMINOPHEN 325 MG PO TABS: 650.0000 mg | ORAL_TABLET | Freq: Four times a day (QID) | ORAL | Status: DC | PRN

## 2019-11-20 MED ORDER — ROCURONIUM BROMIDE 10 MG/ML (PF) SYRINGE
PREFILLED_SYRINGE | INTRAVENOUS | Status: AC
  Filled 2019-11-20: qty 20

## 2019-11-20 MED ORDER — SODIUM CHLORIDE 0.9 % IV SOLN: INTRAVENOUS | Status: DC

## 2019-11-20 MED ORDER — LORAZEPAM 1 MG PO TABS: 1.0000 mg | ORAL_TABLET | ORAL | Status: AC | PRN

## 2019-11-20 MED ORDER — SUCRALFATE 1 GM/10ML PO SUSP
1.0000 g | Freq: Three times a day (TID) | ORAL | Status: DC
  Administered 2019-11-20 – 2019-11-28 (×19): 1 g via ORAL
  Filled 2019-11-20 (×25): qty 10

## 2019-11-20 MED ORDER — SODIUM CHLORIDE 0.9 % IV SOLN
INTRAVENOUS | Status: DC
  Administered 2019-11-20 (×2): via INTRAVENOUS

## 2019-11-20 MED ORDER — FENTANYL CITRATE (PF) 100 MCG/2ML IJ SOLN
INTRAMUSCULAR | Status: DC | PRN
  Administered 2019-11-20 (×2): 50 ug via INTRAVENOUS

## 2019-11-20 MED ORDER — LACTATED RINGERS IV SOLN
INTRAVENOUS | Status: DC | PRN
  Administered 2019-11-20: 15:00:00 via INTRAVENOUS

## 2019-11-20 MED ORDER — LORAZEPAM 2 MG/ML IJ SOLN
1.0000 mg | INTRAMUSCULAR | Status: AC | PRN
  Administered 2019-11-20: 21:00:00 2 mg via INTRAVENOUS
  Administered 2019-11-22 (×3): 1 mg via INTRAVENOUS
  Administered 2019-11-22: 23:00:00 2 mg via INTRAVENOUS
  Filled 2019-11-20 (×6): qty 1

## 2019-11-20 MED ORDER — ARTIFICIAL TEARS OPHTHALMIC OINT
TOPICAL_OINTMENT | OPHTHALMIC | Status: AC
  Filled 2019-11-20: qty 3.5

## 2019-11-20 MED ORDER — PANTOPRAZOLE SODIUM 40 MG IV SOLR: 40.0000 mg | Freq: Two times a day (BID) | INTRAVENOUS | Status: DC

## 2019-11-20 MED ORDER — SODIUM CHLORIDE 0.9 % IV SOLN
2.0000 g | INTRAVENOUS | Status: DC
  Administered 2019-11-20 – 2019-11-26 (×7): 2 g via INTRAVENOUS
  Filled 2019-11-20 (×7): qty 20

## 2019-11-20 MED ORDER — FOLIC ACID 1 MG PO TABS
1.0000 mg | ORAL_TABLET | Freq: Every day | ORAL | Status: DC
  Administered 2019-11-20 – 2019-11-23 (×2): 1 mg via ORAL
  Filled 2019-11-20 (×2): qty 1

## 2019-11-20 MED ORDER — THIAMINE HCL 100 MG PO TABS
100.0000 mg | ORAL_TABLET | Freq: Every day | ORAL | Status: DC
  Administered 2019-11-20 – 2019-11-29 (×6): 100 mg via ORAL
  Filled 2019-11-20 (×6): qty 1

## 2019-11-20 MED ORDER — LIDOCAINE HCL (CARDIAC) PF 50 MG/5ML IV SOSY
PREFILLED_SYRINGE | INTRAVENOUS | Status: DC | PRN
  Administered 2019-11-20: 60 mg via INTRAVENOUS

## 2019-11-20 MED ORDER — SODIUM CHLORIDE 0.9 % IV SOLN
3.0000 mg/kg | Freq: Once | INTRAVENOUS | Status: AC
  Administered 2019-11-20: 14:00:00 355 mg via INTRAVENOUS
  Filled 2019-11-20: qty 7.1

## 2019-11-20 MED ORDER — ONDANSETRON HCL 4 MG PO TABS: 4.0000 mg | ORAL_TABLET | Freq: Four times a day (QID) | ORAL | Status: DC | PRN

## 2019-11-20 MED ORDER — PANTOPRAZOLE SODIUM 40 MG IV SOLR
40.0000 mg | Freq: Two times a day (BID) | INTRAVENOUS | Status: DC
  Administered 2019-11-23 – 2019-11-25 (×5): 40 mg via INTRAVENOUS
  Filled 2019-11-20 (×5): qty 40

## 2019-11-20 MED ORDER — MIDAZOLAM HCL 5 MG/5ML IJ SOLN
INTRAMUSCULAR | Status: DC | PRN
  Administered 2019-11-20: 2 mg via INTRAVENOUS

## 2019-11-20 MED ORDER — FENTANYL CITRATE (PF) 100 MCG/2ML IJ SOLN
INTRAMUSCULAR | Status: AC
  Filled 2019-11-20: qty 2

## 2019-11-20 MED ORDER — SUCCINYLCHOLINE CHLORIDE 20 MG/ML IJ SOLN
INTRAMUSCULAR | Status: DC | PRN
  Administered 2019-11-20: 60 mg via INTRAVENOUS
  Administered 2019-11-20: 120 mg via INTRAVENOUS

## 2019-11-20 MED ORDER — SUCCINYLCHOLINE CHLORIDE 200 MG/10ML IV SOSY
PREFILLED_SYRINGE | INTRAVENOUS | Status: AC
  Filled 2019-11-20: qty 30

## 2019-11-20 MED ORDER — GLYCOPYRROLATE PF 0.2 MG/ML IJ SOSY
PREFILLED_SYRINGE | INTRAMUSCULAR | Status: AC
  Filled 2019-11-20: qty 2

## 2019-11-20 MED ORDER — SODIUM CHLORIDE 0.9 % IV SOLN
80.0000 mg | Freq: Once | INTRAVENOUS | Status: AC
  Administered 2019-11-20: 80 mg via INTRAVENOUS
  Filled 2019-11-20: qty 80

## 2019-11-20 MED ORDER — SUCCINYLCHOLINE CHLORIDE 200 MG/10ML IV SOSY
PREFILLED_SYRINGE | INTRAVENOUS | Status: AC
  Filled 2019-11-20: qty 20

## 2019-11-20 MED ORDER — ACEBUTOLOL HCL 200 MG PO CAPS
400.0000 mg | ORAL_CAPSULE | Freq: Two times a day (BID) | ORAL | Status: DC
  Administered 2019-11-20: 400 mg via ORAL
  Filled 2019-11-20 (×8): qty 2

## 2019-11-20 MED ORDER — ADULT MULTIVITAMIN W/MINERALS CH
1.0000 | ORAL_TABLET | Freq: Every day | ORAL | Status: DC
  Administered 2019-11-20 – 2019-11-29 (×7): 1 via ORAL
  Filled 2019-11-20 (×7): qty 1

## 2019-11-20 MED ORDER — SODIUM CHLORIDE 0.9 % IV SOLN
8.0000 mg/h | INTRAVENOUS | Status: AC
  Administered 2019-11-20 – 2019-11-23 (×5): 8 mg/h via INTRAVENOUS
  Filled 2019-11-20 (×11): qty 80

## 2019-11-20 MED ORDER — ONDANSETRON HCL 4 MG/2ML IJ SOLN
4.0000 mg | Freq: Four times a day (QID) | INTRAMUSCULAR | Status: DC | PRN
  Administered 2019-11-21 – 2019-11-24 (×3): 4 mg via INTRAVENOUS
  Filled 2019-11-20 (×4): qty 2

## 2019-11-20 MED ORDER — PROPOFOL 10 MG/ML IV BOLUS
INTRAVENOUS | Status: AC
  Filled 2019-11-20: qty 20

## 2019-11-20 MED ORDER — OCTREOTIDE ACETATE 500 MCG/ML IJ SOLN
INTRAMUSCULAR | Status: AC
  Filled 2019-11-20: qty 1

## 2019-11-20 MED ORDER — MIDAZOLAM HCL 2 MG/2ML IJ SOLN
INTRAMUSCULAR | Status: AC
  Filled 2019-11-20: qty 2

## 2019-11-20 NOTE — Op Note (Addendum)
[facility] Patient Name: Bradley Miles Procedure Date: 11/20/2019 2:41 PM MRN: [id number] Date of Birth: [birth year] Attending MD: Norvel Richards , MD CSN: [id number] Age: 53 Admit Type: Inpatient Procedure:                Upper GI endoscopy Indications:              Coffee-ground emesis Providers:                Norvel Richards, MD, Charlsie Quest. Theda Sers RN, RN,                            Raphael Gibney, Technician Referring MD:             Caryl Pina CRNA Medicines:                General Anesthesia Complications:            No immediate complications. Estimated Blood Loss:     Estimated blood loss was minimal. Procedure:                Pre-Anesthesia Assessment:                           - Prior to the procedure, a History and Physical                            was performed, and patient medications and                            allergies were reviewed. The patient's tolerance of                            previous anesthesia was also reviewed. The risks                            and benefits of the procedure and the sedation                            options and risks were discussed with the patient.                            All questions were answered, and informed consent                            was obtained. Prior Anticoagulants: The patient has                            taken no previous anticoagulant or antiplatelet                            agents. ASA Grade Assessment: III - A patient with                            severe systemic disease. After reviewing the risks  and benefits, the patient was deemed in                            satisfactory condition to undergo the procedure.                           After obtaining informed consent, the endoscope was                            passed under direct vision. Throughout the                            procedure, the patient's blood pressure, pulse, and             oxygen saturations were monitored continuously. The                            [id number]-H190 ([id number]) scope was introduced through the                            mouth, and advanced to the second part of duodenum.                            The upper GI endoscopy was accomplished without                            difficulty. The patient tolerated the procedure                            well. Scope In: 3:18:30 PM Scope Out: 3:27:41 PM Total Procedure Duration: 0 hours 9 minutes 11 seconds  Findings:      Severe erosive reflux esophagitis with linear erosions coming up 10 cm       into the distal esophagus from the GE junction. Circumferential       confluent erosions within 2 cm of the GE junction. Friable mucosa.      Patient now has 4 columns of grade 1-2 esophageal varices - distal       esophagus without bleeding stigmata.      Stomach empty. Marked portal gastropathy; congested friable mucosa       diffusely. Lobulated fundal/cardia varices -somewhat bulging; area of       active oozing in the cardia without apparent underlying varix seen. This       bleeding area was clipped x1 with good hemostasis.      No ulcer seen. Normal bulb and second portion Impression:               -Severe erosive reflux esophagitis. Finding of                            esophageal varices - new since last EGD (no                            bleeding stigmata).                           Gastric  varices - new since last EGD. Marked portal                            gastropathy with mucosal bleeding treated as                            described above. Moderate Sedation:      Moderate (conscious) sedation was personally administered by an       anesthesia professional. The following parameters were monitored: oxygen       saturation, heart rate, blood pressure, respiratory rate, EKG, adequacy       of pulmonary ventilation, and response to care. Recommendation:           - Return patient to  hospital ward for ongoing care.                           - Advance diet as tolerated. Change Protonix to 40                            mg twice daily orally tomorrow morning. Carafate                            slurry's 1 g 4 times daily x5 days. Stop octreotide                            for now. Transition antibiotics to Cipro 500 mg                            twice daily x7 days tomorrow morning                           -Patient now needs to be on a nonselective                            beta-blocker. He takes acebutolol for palpitations                            (Dr. Bronson Ing). I would recommend he transition                            to nadolol 40 mg daily and titrate to a target                            heart rate of 55. Recommend checking with                            cardiology if this change can be made. He really                            needs to be started on a nonselective beta-blocker                            before he goes home.  I called the patient's spouse [name], at [phone].                            Discussed findings. His liver disease has                            progressed along with portal hypertension over the                            past year. I told her Mr. Aldaco will not likely                            see 75 as long as he continues to drink as he is                            doing. He likely will need a new liver - total                            sobriety is an absolute prerequisite for liver                            transplantation as I discussed. He has failed                            rehabilitation previously. Wife is interested in                            getting him into a program for another attempt at                            sobriety.                           From a GI standpoint, he may be discharged home                            within the next 24-48 hours. Procedure Code(s):        ---  Professional ---                           [phone], Esophagogastroduodenoscopy, flexible,                            transoral; diagnostic, including collection of                            specimen(s) by brushing or washing, when performed                            (separate procedure) Diagnosis Code(s):        --- Professional ---                           K92.0, Hematemesis CPT copyright 2019  American Medical Association. All rights reserved. The codes documented in this report are preliminary and upon coder review may  be revised to meet current compliance requirements. Cristopher Estimable. Arjuna Doeden, MD Norvel Richards, MD 11/20/2019 4:11:57 PM This report has been signed electronically. Number of Addenda: 0

## 2019-11-20 NOTE — Interval H&P Note (Signed)
History and Physical Interval Note:  11/20/2019 2:59 PM  Bradley Miles  has presented today for surgery, with the diagnosis of coffee ground emesis.  The various methods of treatment have been discussed with the patient and family. After consideration of risks, benefits and other options for treatment, the patient has consented to  Procedure(s): ESOPHAGOGASTRODUODENOSCOPY (EGD) WITH PROPOFOL (N/A) as a surgical intervention.  The patient's history has been reviewed, patient examined, no change in status, stable for surgery.  I have reviewed the patient's chart and labs.  Questions were answered to the patient's satisfaction.     Manus Rudd  Patient seen and examined short stay.  Agree with plan for urgent EGD to further evaluate.  The risks, benefits, limitations, alternatives and imponderables have been reviewed with the patient. Potential for esophageal dilation, biopsy, etc. have also been reviewed.  Questions have been answered. All parties agreeable.

## 2019-11-20 NOTE — Progress Notes (Signed)
PROGRESS NOTE                                                                                                                                                                                                             Patient Demographics:    Bradley Miles, is a 53 y.o. male, DOB - 09/01/1966, XBD:[id number]  Admit date - 11/19/2019   Admitting Physician Oswald Hillock, MD  Outpatient Primary MD for the patient is Luking, Grace Bushy, MD  LOS - 0   Chief Complaint  Patient presents with  . Hematemesis       Brief Narrative    This is a no charge note as patient admitted earlier today, chart, imaging and labs were reviewed.  53 y.o. male, with history of alcohol abuse, cirrhosis of the liver, COPD portal hypertension came to ED with complaints of vomiting coffee-ground emesis since yesterday morning.  Patient said that he had 15 episodes of vomiting.  He has been off his medications because he is noncompliant as per patient's wife.  He drinks 1/fifth alcohol daily.  His last drink was around 9 PM last night.  He denies taking NSAIDs. He had EGD done by Dr. Sydell Axon in February 2020 which showed portal gastropathy. In the ED, gastric occult blood was positive.  Hemoglobin has been stable at 14.8.  Hemodynamically patient is stable.  GI was consulted, Dr. Gala Romney to see patient in a.m.  Started on octreotide infusion, Protonix.    Subjective:    Bradley Miles today has an abdominal pain, no further coffee-ground emesis since admission .    Assessment  & Plan :    Active Problems:   Hematemesis/vomiting blood  Hematemesis/coffee-ground emesis -seen by GI, went for endoscopy, significant for severe erosive esophagitis, portal gastropathy and questionable esophageal varices, change Rocephin to Cipro tomorrow per GI, change acebutolol to nadolol if okay with cardiology, advance diet as tolerated, change Protonix to p.o. twice daily tomorrow.   Alcohol  abuse -start CIWA protocol, thiamine, folate.  Consult social work for alcohol abuse counseling and resources in community for rehab.  Liver cirrhosis with portal hypertension -patient has been noncompliant with his medications as per his wife.    COVID-19 Labs  No results for input(s): DDIMER, FERRITIN, LDH, CRP in the last 72 hours.  Lab Results  Component Value Date  [place] NEGATIVE 11/19/2019   [place] Not Detected 09/17/2019   [place] Not Detected 07/15/2019     Code Status : Full  Family Communication  : wife at bedside  Disposition Plan  : Home  Consults  :  GI  Procedures  : EGD  DVT Prophylaxis  :  SCD  Lab Results  Component Value Date   PLT 77 (L) 11/20/2019    Antibiotics  :    Anti-infectives (From admission, onward)   Start     Dose/Rate Route Frequency Ordered Stop   11/20/19 1130  erythromycin 355 mg in sodium chloride 0.9 % 100 mL IVPB    Note to Pharmacy: To be given 30 minutes prior to EGD.   3 mg/kg  117.9 kg 100 mL/hr over 60 Minutes Intravenous  Once 11/20/19 1106 11/20/19 1732   11/20/19 0815  cefTRIAXone (ROCEPHIN) 2 g in sodium chloride 0.9 % 100 mL IVPB     2 g 200 mL/hr over 30 Minutes Intravenous Every 24 hours 11/20/19 0750          Objective:   Vitals:   11/20/19 1545 11/20/19 1600 11/20/19 1615 11/20/19 1840  BP: 124/65 135/62 125/67 129/80  Pulse: 100 (!) 101 95 97  Resp: 12 (!) 9 (!) 9 16  Temp: 98.5 F (36.9 C)   98.4 F (36.9 C)  TempSrc:    Oral  SpO2: 98% 92% 95% 94%  Weight:      Height:        Wt Readings from Last 3 Encounters:  11/19/19 117.9 kg  10/09/19 122 kg  08/19/19 118 kg     Intake/Output Summary (Last 24 hours) at 11/20/2019 1840 Last data filed at 11/20/2019 1541 Gross per 24 hour  Intake 400 ml  Output --  Net 400 ml     Physical Exam  Awake Alert, Oriented X 3, No new F.N deficits, Normal affect, no tremors Symmetrical Chest wall movement, Good air movement  bilaterally, CTAB RRR,No Gallops,Rubs or new Murmurs, No Parasternal Heave +ve B.Sounds, Abd Soft, No tenderness,  No rebound - guarding or rigidity. No Cyanosis, Clubbing or edema, No new Rash or bruise      Data Review:    CBC Recent Labs  Lab 11/19/19 2123 11/20/19 0831  WBC 6.6 6.2  HGB 14.9 12.6*  HCT 43.4 37.8*  PLT 94* 77*  MCV 106.4* 108.6*  MCH 36.5* 36.2*  MCHC 34.3 33.3  RDW 17.3* 17.2*    Chemistries  Recent Labs  Lab 11/19/19 2208 11/20/19 0456  NA 141  --   K 4.0  --   CL 99  --   CO2 29  --   GLUCOSE 133*  --   BUN 11  --   CREATININE 0.71  --   CALCIUM 9.4  --   MG  --  1.2*  AST 133*  --   ALT 39  --   ALKPHOS 254*  --   BILITOT 8.8*  --    ------------------------------------------------------------------------------------------------------------------ No results for input(s): CHOL, HDL, LDLCALC, TRIG, CHOLHDL, LDLDIRECT in the last 72 hours.  Lab Results  Component Value Date   HGBA1C 5.5 06/01/2018   ------------------------------------------------------------------------------------------------------------------ No results for input(s): TSH, T4TOTAL, T3FREE, THYROIDAB in the last 72 hours.  Invalid input(s): FREET3 ------------------------------------------------------------------------------------------------------------------ No results for input(s): VITAMINB12, FOLATE, FERRITIN, TIBC, IRON, RETICCTPCT in the last 72 hours.  Coagulation profile Recent Labs  Lab 11/19/19 2208  INR 1.3*    No results for input(s): DDIMER in  the last 72 hours.  Cardiac Enzymes No results for input(s): CKMB, TROPONINI, MYOGLOBIN in the last 168 hours.  Invalid input(s): CK ------------------------------------------------------------------------------------------------------------------    Component Value Date/Time   BNP 63.0 06/01/2018 0831    Inpatient Medications  Scheduled Meds: . acebutolol  400 mg Oral BID  . folic acid  1 mg Oral  Daily  . multivitamin with minerals  1 tablet Oral Daily  . [START ON 11/23/2019] pantoprazole  40 mg Intravenous Q12H  . sucralfate  1 g Oral TID WC & HS  . thiamine  100 mg Oral Daily   Or  . thiamine  100 mg Intravenous Daily   Continuous Infusions: . sodium chloride 50 mL/hr at 11/20/19 1741  . cefTRIAXone (ROCEPHIN)  IV Stopped (11/20/19 1032)  . pantoprozole (PROTONIX) infusion 8 mg/hr (11/20/19 1034)   PRN Meds:.acetaminophen **OR** acetaminophen, LORazepam **OR** LORazepam, ondansetron **OR** ondansetron (ZOFRAN) IV  Micro Results Recent Results (from the past 240 hour(s))  SARS Coronavirus 2 by RT PCR (hospital order, performed in [facility] hospital lab)     Status: None   Collection Time: 11/19/19  9:33 PM  Result Value Ref Range Status   SARS Coronavirus 2 NEGATIVE NEGATIVE Final    Comment: (NOTE) SARS-CoV-2 target nucleic acids are NOT DETECTED. The SARS-CoV-2 RNA is generally detectable in upper and lower respiratory specimens during the acute phase of infection. The lowest concentration of SARS-CoV-2 viral copies this assay can detect is 250 copies / mL. A negative result does not preclude SARS-CoV-2 infection and should not be used as the sole basis for treatment or other patient management decisions.  A negative result may occur with improper specimen collection / handling, submission of specimen other than nasopharyngeal swab, presence of viral mutation(s) within the areas targeted by this assay, and inadequate number of viral copies (<250 copies / mL). A negative result must be combined with clinical observations, patient history, and epidemiological information. Fact Sheet for Patients:   StrictlyIdeas.no Fact Sheet for Healthcare Providers: BankingDealers.co.za This test is not yet approved or cleared  by the [place] FDA and has been authorized for detection and/or diagnosis of SARS-CoV-2 by FDA under  an Emergency Use Authorization (EUA).  This EUA will remain in effect (meaning this test can be used) for the duration of the COVID-19 declaration under Section 564(b)(1) of the Act, 21 U.S.C. section 360bbb-3(b)(1), unless the authorization is terminated or revoked sooner. Performed at [facility], [place] [address] [place], [place] [address]     Radiology Reports No results found.  Time Spent in minutes  No charge   Phillips Climes M.D on 11/20/2019 at 6:40 PM  Between 7am to 7pm - Pager - [phone]  After 7pm go to [email or web address] - password [facility]  [facility] -  Office  [phone]

## 2019-11-20 NOTE — Anesthesia Procedure Notes (Signed)
Procedure Name: Intubation Date/Time: 11/20/2019 3:16 PM Performed by: Ollen Bowl, CRNA Pre-anesthesia Checklist: Patient identified, Patient being monitored, Timeout performed, Emergency Drugs available and Suction available Patient Re-evaluated:Patient Re-evaluated prior to induction Oxygen Delivery Method: Circle system utilized Preoxygenation: Pre-oxygenation with 100% oxygen Induction Type: IV induction Ventilation: Mask ventilation without difficulty Laryngoscope Size: Glidescope (s3) Grade View: Grade I Tube type: Oral Tube size: 7.5 mm Number of attempts: 1 Airway Equipment and Method: Stylet Placement Confirmation: ETT inserted through vocal cords under direct vision,  positive ETCO2 and breath sounds checked- equal and bilateral Secured at: 22 cm Tube secured with: Tape Dental Injury: Teeth and Oropharynx as per pre-operative assessment

## 2019-11-20 NOTE — Transfer of Care (Signed)
Immediate Anesthesia Transfer of Care Note  Patient: Bradley Miles  Procedure(s) Performed: ESOPHAGOGASTRODUODENOSCOPY (EGD) WITH PROPOFOL (N/A )  Patient Location: PACU  Anesthesia Type:General  Level of Consciousness: awake, oriented and patient cooperative  Airway & Oxygen Therapy: Patient Spontanous Breathing  Post-op Assessment: Report given to RN and Post -op Vital signs reviewed and stable  Post vital signs: Reviewed and stable  Last Vitals:  Vitals Value Taken Time  BP 102/57 11/20/19 1538  Temp    Pulse 104 11/20/19 1542  Resp 12 11/20/19 1542  SpO2 89 % 11/20/19 1542  Vitals shown include unvalidated device data.  Last Pain:  Vitals:   11/20/19 1500  TempSrc:   PainSc: 0-No pain         Complications: No apparent anesthesia complications

## 2019-11-20 NOTE — ED Notes (Signed)
Per Endo Staff GI coming to see patient this am.

## 2019-11-20 NOTE — ED Notes (Signed)
Micro at [facility] verified they received covid swabs and order changed to rapid test for Endo procedure.

## 2019-11-20 NOTE — H&P (View-Only) (Signed)
Discussed with Dr. Gala Romney. Will plan on intubation for airway protection prior to EGD. I contacted anesthesia and made aware. Spoke personally to anesthesiologist.   Erythromycin (3mg /kg) IV X 1  has also been ordered to aid in improved gastric visualization. Historically with retained gastric contents in Feb 2020.

## 2019-11-20 NOTE — Anesthesia Postprocedure Evaluation (Signed)
Anesthesia Post Note  Patient: Bradley Miles  Procedure(s) Performed: ESOPHAGOGASTRODUODENOSCOPY (EGD) WITH PROPOFOL (N/A )  Patient location during evaluation: PACU Anesthesia Type: General Level of consciousness: awake and oriented Pain management: pain level controlled Vital Signs Assessment: post-procedure vital signs reviewed and stable Respiratory status: spontaneous breathing Cardiovascular status: stable Postop Assessment: no apparent nausea or vomiting Anesthetic complications: no     Last Vitals:  Vitals:   11/20/19 1321 11/20/19 1328  BP:  139/90  Pulse: 95   Resp: 16   Temp:    SpO2: (!) 87% 91%    Last Pain:  Vitals:   11/20/19 1500  TempSrc:   PainSc: 0-No pain                 Jahira Swiss A

## 2019-11-20 NOTE — Progress Notes (Addendum)
Discussed with Dr. Gala Romney. Will plan on intubation for airway protection prior to EGD. I contacted anesthesia and made aware. Spoke personally to anesthesiologist.   Erythromycin (3mg /kg) IV X 1  has also been ordered to aid in improved gastric visualization. Historically with retained gastric contents in Feb 2020.

## 2019-11-20 NOTE — H&P (Signed)
TRH H&P    Patient Demographics:    Bradley Miles, is a 53 y.o. male  MRN: [id number]  DOB - 21-May-1966  Admit Date - 11/19/2019  Referring MD/NP/PA: Janetta Hora  Outpatient Primary MD for the patient is [name], MD  Patient coming from: Home  Chief complaint-vomiting blood   HPI:    Bradley Miles  is a 53 y.o. male, with history of alcohol abuse, cirrhosis of the liver, COPD portal hypertension came to ED with complaints of vomiting coffee-ground emesis since yesterday morning.  Patient said that he had 15 episodes of vomiting.  He has been off his medications because he is noncompliant as per patient's wife.  He drinks 1/fifth alcohol daily.  His last drink was around 9 PM last night.  He denies taking NSAIDs. Denies chest pain Complains of some shortness of breath. Denies fever or chills. Denies abdominal pain or dysuria. No blood in the stool. He had EGD done by Dr. Sydell Axon in February 2020 which showed portal gastropathy.  In the ED, gastric occult blood was positive.  Hemoglobin has been stable at 14.8.  Hemodynamically patient is stable.  GI was consulted, Dr. Gala Romney to see patient in a.m.  Started on octreotide infusion, Protonix.    Review of systems:    In addition to the HPI above,   All other systems reviewed and are negative.    Past History of the following :    Past Medical History:  Diagnosis Date  . Anxiety   . Enlarged liver   . GERD (gastroesophageal reflux disease)   . Hypertension   . Palpitations   . PVC's (premature ventricular contractions)   . Shortness of breath   . Varicose veins       Past Surgical History:  Procedure Laterality Date  . BIOPSY  02/02/2019   Procedure: BIOPSY;  Surgeon: Daneil Dolin, MD;  Location: [facility];  Service: Endoscopy;;  gastric  . COLONOSCOPY WITH ESOPHAGOGASTRODUODENOSCOPY (EGD)  12/16/2012   internal hemorrhoids,  colonic diverticulosis, benign polyps, screening in 2024. EGD with mild erosive reflux esophagitis, small hiatal hernia, negative H.pylori  . cyst reomved     from spine  . ESOPHAGOGASTRODUODENOSCOPY  05/19/09   RMR: Geographic distal esophageal erosions consistent with severe erosive reflux esophagitis. schatzki's ring s/P dilation/small hiatal hernia otherwise normal stomach  . ESOPHAGOGASTRODUODENOSCOPY (EGD) WITH PROPOFOL N/A 02/02/2019   Dr. Gala Romney: retained gastric contents. portal HTN gastropathy  . WISDOM TOOTH EXTRACTION        Social History:      Social History   Tobacco Use  . Smoking status: Current Every Day Smoker    Packs/day: 1.50    Years: 30.00    Pack years: 45.00    Types: Cigarettes    Start date: 12/24/1979  . Smokeless tobacco: Never Used  Substance Use Topics  . Alcohol use: Yes    Alcohol/week: 0.0 standard drinks    Comment: consumes beer or liquor three to four times weekly. Up to six pack or three shots each  occurence or more x 20+ yrs.08/19/19 no alcohol past 3 weeks       Family History :     Family History  Problem Relation Age of Onset  . Cancer Mother        ?etiology  . Heart disease Mother   . Colon polyps Sister 51       two sisters  . Liver disease Neg Hx       Home Medications:   Prior to Admission medications   Medication Sig Start Date End Date Taking? Authorizing Provider  acebutolol (SECTRAL) 400 MG capsule Take 1 capsule (400 mg total) by mouth 2 (two) times daily. 10/13/19   Herminio Commons, MD  allopurinol (ZYLOPRIM) 100 MG tablet TAKE 1 TABLET BY MOUTH EVERY DAY 07/13/19   [name], MD  aspirin EC 81 MG tablet Take 81 mg by mouth daily.    [provider]  cholecalciferol (VITAMIN D3) 25 MCG (1000 UT) tablet Take 1,000 Units by mouth daily.    [provider]  DEXILANT 60 MG capsule TAKE 1 CAPSULE BY MOUTH EVERY DAY 06/18/19   [name], MD  famotidine (PEPCID) 10 MG tablet Take 10 mg by  mouth as needed for heartburn or indigestion.    [provider]  folic acid (FOLVITE) 633 MCG tablet Take 400 mcg by mouth daily.    [provider]  gabapentin (NEURONTIN) 300 MG capsule TAKE 1 CAPSULE BY MOUTH FOUR TIMES DAILY Patient taking differently: Take 300 mg by mouth 2 (two) times daily. TAKE 1 CAPSULE BY MOUTH FOUR TIMES DAILY 08/24/19   [name], MD  lactulose, encephalopathy, (CHRONULAC) 10 GM/15ML SOLN Take 15-30cc three times daily to Titrate for 3-4 soft bowel movements a day Patient taking differently: Take 15-30cc three times daily to Titrate for 3-4 soft bowel movements a day (sometimes forgets mid day dose) 08/04/19   Mahala Menghini, PA-C  LORazepam (ATIVAN) 1 MG tablet Take 0.5 tablets (0.5 mg total) by mouth 2 (two) times daily as needed. for anxiety 07/29/19   [name], MD  nortriptyline (PAMELOR) 50 MG capsule TAKE 1 CAPSULE(50 MG) BY MOUTH AT BEDTIME 03/23/19   [name], MD  Omega-3 Fatty Acids (FISH OIL PO) Take 3 capsules by mouth as needed.     [provider]  ondansetron (ZOFRAN-ODT) 4 MG disintegrating tablet DISSOLVE 1 TABLET ON THE TONGUE EVERY 8 HOURS AS NEEDED FOR NAUSEA 08/19/19   Carlis Stable, NP  PARoxetine (PAXIL) 20 MG tablet TAKE 1 TABLET(20 MG) BY MOUTH DAILY 09/22/19   [name], MD  sildenafil (VIAGRA) 100 MG tablet TAKE 1/2 TABLET BY MOUTH EVERY DAY 06/18/19   [name], MD  vitamin B-12 (CYANOCOBALAMIN) 1000 MCG tablet Take 1,000 mcg by mouth daily.    [provider]     Allergies:    No Known Allergies   Physical Exam:   Vitals  Blood pressure 140/90, pulse 95, temperature 98.4 F (36.9 C), temperature source Oral, resp. rate 13, height 6\' 1"  (1.854 m), weight 117.9 kg, SpO2 97 %.  1.  General: Appears in no acute distress  2. Psychiatric: Alert, oriented x3, intact insight and judgment  3. Neurologic: Cranial nerves II through XII grossly intact, no focal deficit  noted  4. HEENMT:  Atraumatic normocephalic, extraocular muscle intact  5. Respiratory : Clear to auscultation bilaterally, no wheezing or crackles  6. Cardiovascular : S1-S2, regular, no murmur auscultated  7. Gastrointestinal:  Abdomen is soft, nontender, no organomegaly     Data Review:    CBC Recent Labs  Lab 11/19/19 2123  WBC 6.6  HGB 14.9  HCT 43.4  PLT 94*  MCV 106.4*  MCH 36.5*  MCHC 34.3  RDW 17.3*   ------------------------------------------------------------------------------------------------------------------  Results for orders placed or performed during the hospital encounter of 11/19/19 (from the past 48 hour(s))  CBC     Status: Abnormal   Collection Time: 11/19/19  9:23 PM  Result Value Ref Range   WBC 6.6 4.0 - 10.5 K/uL   RBC 4.08 (L) 4.22 - 5.81 MIL/uL   Hemoglobin 14.9 13.0 - 17.0 g/dL   HCT 43.4 39.0 - 52.0 %   MCV 106.4 (H) 80.0 - 100.0 fL   MCH 36.5 (H) 26.0 - 34.0 pg   MCHC 34.3 30.0 - 36.0 g/dL   RDW 17.3 (H) 11.5 - 15.5 %   Platelets 94 (L) 150 - 400 K/uL    Comment: PLATELET COUNT CONFIRMED BY SMEAR Immature Platelet Fraction may be clinically indicated, consider ordering this additional test [id number]    nRBC 0.0 0.0 - 0.2 %    Comment: Performed at [facility], [address] [place], [place] [address]  Ethanol     Status: None   Collection Time: 11/19/19  9:23 PM  Result Value Ref Range   Alcohol, Ethyl (B) <10 <10 mg/dL    Comment: (NOTE) Lowest detectable limit for serum alcohol is 10 mg/dL. For medical purposes only. Performed at [facility], [address], [place], [place] [address]   Type and screen [facility]     Status: None   Collection Time: 11/19/19  9:29 PM  Result Value Ref Range   ABO/RH(D) A NEG    Antibody Screen NEG    Sample Expiration      11/22/2019,2359 Performed at [facility] [address] [place], [place] [address]   Comprehensive metabolic panel     Status: Abnormal    Collection Time: 11/19/19 10:08 PM  Result Value Ref Range   Sodium 141 135 - 145 mmol/L   Potassium 4.0 3.5 - 5.1 mmol/L   Chloride 99 98 - 111 mmol/L   CO2 29 22 - 32 mmol/L   Glucose, Bld 133 (H) 70 - 99 mg/dL   BUN 11 6 - 20 mg/dL   Creatinine, Ser 0.71 0.61 - 1.24 mg/dL   Calcium 9.4 8.9 - 10.3 mg/dL   Total Protein 8.3 (H) 6.5 - 8.1 g/dL   Albumin 3.1 (L) 3.5 - 5.0 g/dL   AST 133 (H) 15 - 41 U/L   ALT 39 0 - 44 U/L   Alkaline Phosphatase 254 (H) 38 - 126 U/L   Total Bilirubin 8.8 (H) 0.3 - 1.2 mg/dL   GFR calc non Af Amer >60 >60 mL/min   GFR calc Af Amer >60 >60 mL/min   Anion gap 13 5 - 15    Comment: Performed at [facility], [address] [place], [place] [address]  Protime-INR     Status: Abnormal   Collection Time: 11/19/19 10:08 PM  Result Value Ref Range   Prothrombin Time 16.0 (H) 11.4 - 15.2 seconds   INR 1.3 (H) 0.8 - 1.2    Comment: (NOTE) INR goal varies based on device and disease states. Performed at [facility], [address], [place], [place] [address]   APTT     Status: Abnormal   Collection Time: 11/19/19 10:08 PM  Result Value Ref Range   aPTT 41 (H) 24 - 36 seconds    Comment:        IF BASELINE aPTT IS ELEVATED, SUGGEST PATIENT RISK ASSESSMENT BE USED TO DETERMINE APPROPRIATE ANTICOAGULANT THERAPY. Performed at [facility], [address], [place], [place] [address]   Occult bld gastric/duodenum (cup to lab)     Status: Abnormal   Collection Time: 11/20/19 12:39 AM  Result Value Ref Range   pH, Gastric 4    Occult Blood, Gastric POSITIVE (A) NEGATIVE    Comment: Performed at [facility], [address] [place], [place] [address]    Chemistries  Recent Labs  Lab 11/19/19 2208  NA 141  K 4.0  CL 99  CO2 29  GLUCOSE 133*  BUN 11  CREATININE 0.71  CALCIUM 9.4  AST 133*  ALT 39  ALKPHOS 254*  BILITOT 8.8*    ------------------------------------------------------------------------------------------------------------------  ------------------------------------------------------------------------------------------------------------------ GFR: Estimated Creatinine Clearance: 146.9 mL/min (by C-G formula based on SCr of 0.71 mg/dL). Liver Function Tests: Recent Labs  Lab 11/19/19 2208  AST 133*  ALT 39  ALKPHOS 254*  BILITOT 8.8*  PROT 8.3*  ALBUMIN 3.1*   No results for input(s): LIPASE, AMYLASE in the last 168 hours. No results for input(s): AMMONIA in the last 168 hours. Coagulation Profile: Recent Labs  Lab 11/19/19 2208  INR 1.3*   Cardiac Enzymes: No results for input(s): CKTOTAL, CKMB, CKMBINDEX, TROPONINI in the last 168 hours. BNP (last 3 results) No results for input(s): PROBNP in the last 8760 hours. HbA1C: No results for input(s): HGBA1C in the last 72 hours. CBG: No results for input(s): GLUCAP in the last 168 hours. Lipid Profile: No results for input(s): CHOL, HDL, LDLCALC, TRIG, CHOLHDL, LDLDIRECT in the last 72 hours. Thyroid Function Tests: No results for input(s): TSH, T4TOTAL, FREET4, T3FREE, THYROIDAB in the last 72 hours. Anemia Panel: No results for input(s): VITAMINB12, FOLATE, FERRITIN, TIBC, IRON, RETICCTPCT in the last 72 hours.  --------------------------------------------------------------------------------------------------------------- Urine analysis:    Component Value Date/Time   COLORURINE YELLOW 06/03/2018 [place] 06/03/2018 1555   LABSPEC 1.017 06/03/2018 1555   PHURINE 5.0 06/03/2018 1555   GLUCOSEU NEGATIVE 06/03/2018 1555   HGBUR NEGATIVE 06/03/2018 1555   BILIRUBINUR NEGATIVE 06/03/2018 1555   KETONESUR NEGATIVE 06/03/2018 1555   PROTEINUR NEGATIVE 06/03/2018 1555   NITRITE NEGATIVE 06/03/2018 1555   LEUKOCYTESUR NEGATIVE 06/03/2018 1555      Imaging Results:    No results found.  My personal review of  EKG: Rhythm NSR, left axis deviation, no ST-T changes   Assessment & Plan:    Active Problems:   Hematemesis/vomiting blood   1. Hematemesis/coffee-ground emesis-keep n.p.o., octreotide infusion, Protonix 40 mg IV every 12 hours.  GI consult in a.m.  2. Alcohol abuse-start CIWA protocol, thiamine, folate.  Consult social work for alcohol abuse counseling and resources in community for rehab.  3. Liver cirrhosis with portal hypertension-patient has been noncompliant with his medications as per his wife.  Will await GI recommendations as above.   DVT Prophylaxis-   SCDs  AM Labs Ordered, also please review Full Orders  Family Communication: Admission, patients condition and plan of care including tests being ordered have been discussed with the patient who indicate understanding and agree with the plan and Code Status.  Code Status: Full code  Admission status: Inpatient: Based on patients clinical presentation and evaluation of above clinical data, I have made determination that patient meets Inpatient criteria at  this time.  Time spent in minutes : 60 minutes   Fabiano Ginley S Tiago Humphrey M.D

## 2019-11-20 NOTE — Consult Note (Addendum)
Referring Provider: Dr. Darrick Meigs  Primary Care Physician:  [name], MD Primary Gastroenterologist:  Dr. Gala Romney  Date of Admission: 11/19/19 Date of Consultation: 11/20/19  Reason for Consultation:  Coffee-ground emesis   HPI:  Bradley Miles is a 53 y.o. 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 Persistent thrombocytopenia. Presenting with acute onset of coffee-ground emesis yesterday to the ED. Admitting Hgb 14.9, with Hgb 12.6 today. Platelets 77.   Wife, Bradley Miles, is at bedside. Patient is able to give own history. Sluggish over the past 36 hours but no confusion. Hasn't taken Lactulose in the past few days. No food intake over past few days, only ETOH. Urine medium tea color. Noted scleral icterus. No baby aspirin in last few weeks. +chills yesterday while in bed. No fever. Tmax 97.9 at home. Last episode of coffee-ground emesis at admission, none further. No melena. Drinks 1/5 whiskey a day. Last drink approximately 48 hours ago per wife. Nursing staff state he had water on admission from wife, and since then has only had the sponge to dip in ice water. Patient states he took a few sips of water around 8 am.   Tbili 8.8, notably increased from baseline. Alk Phos 254, AST 133 and ALT normal. INR 1.3. last imaging in August 2020 with hepatic steatosis and hepatosplenomegaly. Gallstones noted without cholecystitis.    EGD Feb 2020 with normal esophagus, moderate portal hypertensive gastropathy in entire stomach, retained gastric contents made difficult to see all of the mucosa.   Past Medical History:  Diagnosis Date  . Anxiety   . Enlarged liver   . GERD (gastroesophageal reflux disease)   . Hypertension   . Palpitations   . PVC's (premature ventricular contractions)   . Shortness of breath    . Varicose veins     Past Surgical History:  Procedure Laterality Date  . BIOPSY  02/02/2019   Procedure: BIOPSY;  Surgeon: Daneil Dolin, MD;  Location: [facility];  Service: Endoscopy;;  gastric  . COLONOSCOPY WITH ESOPHAGOGASTRODUODENOSCOPY (EGD)  12/16/2012   internal hemorrhoids, colonic diverticulosis, benign polyps, screening in 2024. EGD with mild erosive reflux esophagitis, small hiatal hernia, negative H.pylori  . cyst reomved     from spine  . ESOPHAGOGASTRODUODENOSCOPY  05/19/09   RMR: Geographic distal esophageal erosions consistent with severe erosive reflux esophagitis. schatzki's ring s/P dilation/small hiatal hernia otherwise normal stomach  . ESOPHAGOGASTRODUODENOSCOPY (EGD) WITH PROPOFOL N/A 02/02/2019   Dr. Gala Romney: retained gastric contents. portal HTN gastropathy  . WISDOM TOOTH EXTRACTION      Prior to Admission medications   Medication Sig Start Date End Date Taking? Authorizing Provider  acebutolol (SECTRAL) 400 MG capsule Take 1 capsule (400 mg total) by mouth 2 (two) times daily. 10/13/19   Herminio Commons, MD  allopurinol (ZYLOPRIM) 100 MG tablet TAKE 1 TABLET BY MOUTH EVERY DAY 07/13/19   [name], MD  aspirin EC 81 MG tablet Take 81 mg by mouth daily.    [provider]  cholecalciferol (VITAMIN D3) 25 MCG (1000 UT) tablet Take 1,000 Units by mouth daily.    [provider]  DEXILANT 60 MG capsule TAKE 1 CAPSULE BY MOUTH EVERY DAY 06/18/19   [name], MD  famotidine (PEPCID) 10 MG tablet Take 10 mg by mouth as needed  for heartburn or indigestion.    [provider]  folic acid (FOLVITE) 494 MCG tablet Take 400 mcg by mouth daily.    [provider]  gabapentin (NEURONTIN) 300 MG capsule TAKE 1 CAPSULE BY MOUTH FOUR TIMES DAILY Patient taking differently: Take 300 mg by mouth 2 (two) times daily. TAKE 1 CAPSULE BY MOUTH FOUR TIMES DAILY 08/24/19   [name], MD  lactulose, encephalopathy,  (CHRONULAC) 10 GM/15ML SOLN Take 15-30cc three times daily to Titrate for 3-4 soft bowel movements a day Patient taking differently: Take 15-30cc three times daily to Titrate for 3-4 soft bowel movements a day (sometimes forgets mid day dose) 08/04/19   Mahala Menghini, PA-C  LORazepam (ATIVAN) 1 MG tablet Take 0.5 tablets (0.5 mg total) by mouth 2 (two) times daily as needed. for anxiety 07/29/19   [name], MD  nortriptyline (PAMELOR) 50 MG capsule TAKE 1 CAPSULE(50 MG) BY MOUTH AT BEDTIME 03/23/19   [name], MD  Omega-3 Fatty Acids (FISH OIL PO) Take 3 capsules by mouth as needed.     [provider]  ondansetron (ZOFRAN-ODT) 4 MG disintegrating tablet DISSOLVE 1 TABLET ON THE TONGUE EVERY 8 HOURS AS NEEDED FOR NAUSEA 08/19/19   Carlis Stable, NP  PARoxetine (PAXIL) 20 MG tablet TAKE 1 TABLET(20 MG) BY MOUTH DAILY 09/22/19   [name], MD  sildenafil (VIAGRA) 100 MG tablet TAKE 1/2 TABLET BY MOUTH EVERY DAY 06/18/19   [name], MD  vitamin B-12 (CYANOCOBALAMIN) 1000 MCG tablet Take 1,000 mcg by mouth daily.    [provider]    Current Facility-Administered Medications  Medication Dose Route Frequency Provider Last Rate Last Admin  . 0.9 %  sodium chloride infusion   Intravenous Continuous Oswald Hillock, MD 50 mL/hr at 11/20/19 0637 New Bag at 11/20/19 4967  . acebutolol (SECTRAL) capsule 400 mg  400 mg Oral BID Oswald Hillock, MD      . acetaminophen (TYLENOL) tablet 650 mg  650 mg Oral Q6H PRN Oswald Hillock, MD       Or  . acetaminophen (TYLENOL) suppository 650 mg  650 mg Rectal Q6H PRN Oswald Hillock, MD      . cefTRIAXone (ROCEPHIN) 2 g in sodium chloride 0.9 % 100 mL IVPB  2 g Intravenous Q24H Elgergawy, Silver Huguenin, MD      . folic acid (FOLVITE) tablet 1 mg  1 mg Oral Daily Darrick Meigs, Marge Duncans, MD      . LORazepam (ATIVAN) tablet 1-4 mg  1-4 mg Oral Q1H PRN Oswald Hillock, MD       Or  . LORazepam (ATIVAN) injection 1-4 mg  1-4 mg Intravenous Q1H  PRN Oswald Hillock, MD      . multivitamin with minerals tablet 1 tablet  1 tablet Oral Daily Darrick Meigs, Marge Duncans, MD      . octreotide (SANDOSTATIN) 500 mcg in sodium chloride 0.9 % 250 mL (2 mcg/mL) infusion  25 mcg/hr Intravenous Continuous Oswald Hillock, MD 12.5 mL/hr at 11/20/19 0100 25 mcg/hr at 11/20/19 0100  . ondansetron (ZOFRAN) tablet 4 mg  4 mg Oral Q6H PRN Oswald Hillock, MD       Or  . ondansetron (ZOFRAN) injection 4 mg  4 mg Intravenous Q6H PRN Oswald Hillock, MD      . pantoprazole (PROTONIX) 80 mg in sodium chloride 0.9 % 100 mL IVPB  80 mg Intravenous Once Elgergawy,  Silver Huguenin, MD      . pantoprazole (PROTONIX) 80 mg in sodium chloride 0.9 % 250 mL (0.32 mg/mL) infusion  8 mg/hr Intravenous Continuous Elgergawy, Silver Huguenin, MD      . Derrill Memo ON 11/23/2019] pantoprazole (PROTONIX) injection 40 mg  40 mg Intravenous Q12H Elgergawy, Silver Huguenin, MD      . thiamine (VITAMIN B-1) tablet 100 mg  100 mg Oral Daily Darrick Meigs, Marge Duncans, MD       Or  . thiamine (B-1) injection 100 mg  100 mg Intravenous Daily Oswald Hillock, MD       Current Outpatient Medications  Medication Sig Dispense Refill  . acebutolol (SECTRAL) 400 MG capsule Take 1 capsule (400 mg total) by mouth 2 (two) times daily. 180 capsule 3  . allopurinol (ZYLOPRIM) 100 MG tablet TAKE 1 TABLET BY MOUTH EVERY DAY 30 tablet 5  . aspirin EC 81 MG tablet Take 81 mg by mouth daily.    . cholecalciferol (VITAMIN D3) 25 MCG (1000 UT) tablet Take 1,000 Units by mouth daily.    Marland Kitchen DEXILANT 60 MG capsule TAKE 1 CAPSULE BY MOUTH EVERY DAY 30 capsule 3  . famotidine (PEPCID) 10 MG tablet Take 10 mg by mouth as needed for heartburn or indigestion.    . folic acid (FOLVITE) 341 MCG tablet Take 400 mcg by mouth daily.    Marland Kitchen gabapentin (NEURONTIN) 300 MG capsule TAKE 1 CAPSULE BY MOUTH FOUR TIMES DAILY (Patient taking differently: Take 300 mg by mouth 2 (two) times daily. TAKE 1 CAPSULE BY MOUTH FOUR TIMES DAILY) 360 capsule 0  . lactulose, encephalopathy,  (CHRONULAC) 10 GM/15ML SOLN Take 15-30cc three times daily to Titrate for 3-4 soft bowel movements a day (Patient taking differently: Take 15-30cc three times daily to Titrate for 3-4 soft bowel movements a day (sometimes forgets mid day dose)) 1892 mL 11  . LORazepam (ATIVAN) 1 MG tablet Take 0.5 tablets (0.5 mg total) by mouth 2 (two) times daily as needed. for anxiety 30 tablet 0  . nortriptyline (PAMELOR) 50 MG capsule TAKE 1 CAPSULE(50 MG) BY MOUTH AT BEDTIME 30 capsule 5  . Omega-3 Fatty Acids (FISH OIL PO) Take 3 capsules by mouth as needed.     . ondansetron (ZOFRAN-ODT) 4 MG disintegrating tablet DISSOLVE 1 TABLET ON THE TONGUE EVERY 8 HOURS AS NEEDED FOR NAUSEA 24 tablet 2  . PARoxetine (PAXIL) 20 MG tablet TAKE 1 TABLET(20 MG) BY MOUTH DAILY 30 tablet 5  . sildenafil (VIAGRA) 100 MG tablet TAKE 1/2 TABLET BY MOUTH EVERY DAY 3 tablet 3  . vitamin B-12 (CYANOCOBALAMIN) 1000 MCG tablet Take 1,000 mcg by mouth daily.      Allergies as of 11/19/2019  . (No Known Allergies)    Family History  Problem Relation Age of Onset  . Cancer Mother        ?etiology  . Heart disease Mother   . Colon polyps Sister 50       two sisters  . Liver disease Neg Hx     Social History   Socioeconomic History  . Marital status: Married    Spouse name: Not on file  . Number of children: 2  . Years of education: Not on file  . Highest education level: Not on file  Occupational History  . 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 Smoking status: Current Every Day Smoker    Packs/day: 1.50    Years:  30.00    Pack years: 45.00    Types: Cigarettes    Start date: 12/24/1979  . Smokeless tobacco: Never Used  Substance and Sexual Activity  . Alcohol use: Yes    Alcohol/week: 0.0 standard drinks    Comment: consumes beer or liquor three to four times weekly. Up to six pack or three shots each occurence or more x 20+ yrs.08/19/19 no alcohol past 3 weeks. As of  11/20/2019: drinking 1/5 of whiskey every day  . Drug use: No  . Sexual activity: Yes    Partners: Female    Birth control/protection: None  Other Topics Concern  . Not on file  Social History Narrative   Lives alone  Girlfriend & kids 1/2 time   Social Determinants of Health   Financial Resource Strain:   . Difficulty of Paying Living Expenses: Not on file  Food Insecurity:   . Worried About Charity fundraiser in the Last Year: Not on file  . Ran Out of Food in the Last Year: Not on file  Transportation Needs:   . Lack of Transportation (Medical): Not on file  . Lack of Transportation (Non-Medical): Not on file  Physical Activity:   . Days of Exercise per Week: Not on file  . Minutes of Exercise per Session: Not on file  Stress:   . Feeling of Stress : Not on file  Social Connections:   . Frequency of Communication with Friends and Family: Not on file  . Frequency of Social Gatherings with Friends and Family: Not on file  . Attends Religious Services: Not on file  . Active Member of Clubs or Organizations: Not on file  . Attends Archivist Meetings: Not on file  . Marital Status: Not on file  Intimate Partner Violence:   . Fear of Current or Ex-Partner: Not on file  . Emotionally Abused: Not on file  . Physically Abused: Not on file  . Sexually Abused: Not on file    Review of Systems: Gen: see HPI CV: Denies chest pain, heart palpitations, syncope, edema  Resp: Denies shortness of breath with rest, cough, wheezing GI: see HPI GU : Denies urinary burning, urinary frequency, urinary incontinence.  MS: Denies joint pain,swelling, cramping Derm: Denies rash, itching, dry skin Psych: see HPI Heme: see HPI  Physical Exam: Vital signs in last 24 hours: Temp:  [98.4 F (36.9 C)] 98.4 F (36.9 C) (12/10 2036) Pulse Rate:  [88-100] 92 (12/11 0830) Resp:  [12-20] 15 (12/11 0830) BP: (131-149)/(75-92) 142/87 (12/11 0830) SpO2:  [93 %-98 %] 95 % (12/11  0830) Weight:  [117.9 kg] 117.9 kg (12/10 2037)   General:   Resting with eyes closed but awakens easily. Oriented X 4. Sallow-appearing.  Head:  Normocephalic and atraumatic. Eyes:  +scleral icterus  Ears:  Normal auditory acuity. Nose:  No deformity, discharge,  or lesions. Lungs:  Clear throughout to auscultation.    Heart:  Regular rate and rhythm; no murmurs  Abdomen:  Soft, nontender and nondistended. Obese. +hepatomegaly. TTP RUQ, no rebound or guarding. No obvious tense ascites.  Rectal:  Deferred  Msk:  Symmetrical without gross deformities. Normal posture. Extremities:  With trace pedal edema Neurologic:  oriented x4;  grossly normal neurologically. Psych:  Alert and cooperative. Normal mood and affect.  Intake/Output from previous day: No intake/output data recorded. Intake/Output this shift: No intake/output data recorded.  Lab Results: Recent Labs    11/19/19 2123 11/20/19 0831  WBC 6.6 6.2  HGB  14.9 12.6*  HCT 43.4 37.8*  PLT 94* 77*   BMET Recent Labs    11/19/19 2208  NA 141  K 4.0  CL 99  CO2 29  GLUCOSE 133*  BUN 11  CREATININE 0.71  CALCIUM 9.4   LFT Recent Labs    11/19/19 2208  PROT 8.3*  ALBUMIN 3.1*  AST 133*  ALT 39  ALKPHOS 254*  BILITOT 8.8*   PT/INR Recent Labs    11/19/19 2208  LABPROT 16.0*  INR 1.3*    Impression: 27--year-old male with history of alcohol abuse (1/5th of whiskey daily), known portal gastropathy with last EGD Feb 2020, likely cirrhosis, presenting with coffee-ground emesis yesterday evening to the ED. Hgb 14.9 on admission and down to 12.6 this morning. INR 1.3. MELD Na 18. Although varices were not present on prior EGD earlier this year, unable to rule this out as culprit. Empirically has been started on Rocephin and Octreotide. No further overt GI bleeding since admission.   He also does have a clinical picture of alcoholic hepatitis, and DF is just below 32. Glucocorticoid therapy not indicated at this  time.  Needs EGD with Propofol today. Hemodynamically stable currently, without further overt GI bleeding. Last oral intake small sips of water this morning. No food intake in past few days. As of note, last EGD in Feb 2020 with retained gastric contents (meal 10 hours prior to procedure). Possible intubation for airway protection anticipated.   COVID NEGATIVE on file.   Plan: 1. Remain NPO  2. Agree with Rocephin and octreotide  3. Continue with PPI infusion: anticipate changing to BID after procedure depending on findings  4. Proceed with upper endoscopy by Dr. Gala Romney  today using Propofol. The risks, benefits, and alternatives have been discussed in detail with patient. They have stated understanding and desire to proceed.   5. Discussed with patient and wife morbidity and mortality in setting of continued ETOH use. Patient is interested in options available for rehab after acute issues addressed.      Extended time spent with wife, Bradley Miles, discussing multiple health concerns regarding patient. All questions answered.  Annitta Needs, PhD, ANP-BC [facility]     LOS: 0 days    11/20/2019, 9:57 AM

## 2019-11-20 NOTE — ED Notes (Addendum)
Pt placed on 2L 02 per MD Claiborne Billings

## 2019-11-20 NOTE — ED Notes (Signed)
Pt transported to Pre OP. Belongings sent with wife including suit case and 2 belonging bags. Eye glasses sent with pt per pt request.

## 2019-11-20 NOTE — ED Notes (Signed)
Sunnie Nielsen, PA from Dr. Orvan Falconer office at bedside at this time.

## 2019-11-20 NOTE — Anesthesia Preprocedure Evaluation (Signed)
Anesthesia Evaluation  Patient identified by MRN, date of birth, ID band Patient awake    Reviewed: Allergy & Precautions, NPO status , Patient's Chart, lab work & pertinent test results, reviewed documented beta blocker date and time   Airway Mallampati: III  TM Distance: >3 FB Neck ROM: Full    Dental  (+) Caps, Dental Advisory Given, Missing Crowns and bridges :   Pulmonary shortness of breath and with exertion, COPD,  COPD inhaler, Current Smoker and Patient abstained from smoking.,  O2 Saturations on room air - 88% - 92%  O2 Saturations on room air - 88% - 92% Pulmonary exam normal breath sounds clear to auscultation       Cardiovascular Exercise Tolerance: Poor hypertension, Pt. on medications and Pt. on home beta blockers Normal cardiovascular exam Rhythm:Regular Rate:Normal     Neuro/Psych  Headaches, PSYCHIATRIC DISORDERS Anxiety  Neuromuscular disease    GI/Hepatic GERD  Medicated,(+) Cirrhosis   Esophageal Varices  substance abuse  alcohol use, Hepatitis -, Toxin RelatedCoffee ground emesis    Endo/Other    Renal/GU      Musculoskeletal   Abdominal   Peds  Hematology PT - 16, INR 1.3, APTT - 41   Anesthesia Other Findings   Reproductive/Obstetrics                           Anesthesia Physical Anesthesia Plan  ASA: IV and emergent  Anesthesia Plan: General   Post-op Pain Management:    Induction: Intravenous  PONV Risk Score and Plan: 2 and Ondansetron and TIVA  Airway Management Planned: Oral ETT  Additional Equipment:   Intra-op Plan:   Post-operative Plan:   Informed Consent: I have reviewed the patients History and Physical, chart, labs and discussed the procedure including the risks, benefits and alternatives for the proposed anesthesia with the patient or authorized representative who has indicated his/her understanding and acceptance.     Dental advisory  given  Plan Discussed with: CRNA  Anesthesia Plan Comments:         Anesthesia Quick Evaluation

## 2019-11-20 NOTE — Addendum Note (Signed)
Addendum  created 11/20/19 1612 by Mickel Baas, CRNA   Charge Capture section accepted

## 2019-11-21 ENCOUNTER — Inpatient Hospital Stay (HOSPITAL_COMMUNITY): Payer: Managed Care, Other (non HMO)

## 2019-11-21 DIAGNOSIS — E669 Obesity, unspecified: Secondary | ICD-10-CM

## 2019-11-21 DIAGNOSIS — K7030 Alcoholic cirrhosis of liver without ascites: Secondary | ICD-10-CM

## 2019-11-21 DIAGNOSIS — R739 Hyperglycemia, unspecified: Secondary | ICD-10-CM

## 2019-11-21 LAB — CBC
HCT: 28.9 % — ABNORMAL LOW (ref 39.0–52.0)
HCT: 28.5 % — ABNORMAL LOW (ref 39.0–52.0)
Hemoglobin: 9.5 g/dL — ABNORMAL LOW (ref 13.0–17.0)
Hemoglobin: 9.4 g/dL — ABNORMAL LOW (ref 13.0–17.0)
MCH: 36.8 pg — ABNORMAL HIGH (ref 26.0–34.0)
MCH: 36.7 pg — ABNORMAL HIGH (ref 26.0–34.0)
MCHC: 32.9 g/dL (ref 30.0–36.0)
MCHC: 33 g/dL (ref 30.0–36.0)
MCV: 112 fL — ABNORMAL HIGH (ref 80.0–100.0)
MCV: 111.3 fL — ABNORMAL HIGH (ref 80.0–100.0)
Platelets: 86 10*3/uL — ABNORMAL LOW (ref 150–400)
Platelets: 84 10*3/uL — ABNORMAL LOW (ref 150–400)
RBC: 2.58 MIL/uL — ABNORMAL LOW (ref 4.22–5.81)
RBC: 2.56 MIL/uL — ABNORMAL LOW (ref 4.22–5.81)
RDW: 17.3 % — ABNORMAL HIGH (ref 11.5–15.5)
RDW: 17.3 % — ABNORMAL HIGH (ref 11.5–15.5)
WBC: 7.1 10*3/uL (ref 4.0–10.5)
WBC: 6 10*3/uL (ref 4.0–10.5)
nRBC: 0 % (ref 0.0–0.2)
nRBC: 0 % (ref 0.0–0.2)

## 2019-11-21 LAB — BASIC METABOLIC PANEL
Anion gap: 9 (ref 5–15)
BUN: 14 mg/dL (ref 6–20)
CO2: 29 mmol/L (ref 22–32)
Calcium: 7.4 mg/dL — ABNORMAL LOW (ref 8.9–10.3)
Chloride: 99 mmol/L (ref 98–111)
Creatinine, Ser: 1.07 mg/dL (ref 0.61–1.24)
GFR calc Af Amer: >60 mL/min (ref >60)
GFR calc non Af Amer: >60 mL/min (ref >60)
Glucose, Bld: 147 mg/dL — ABNORMAL HIGH (ref 70–99)
Potassium: 4.3 mmol/L (ref 3.5–5.1)
Sodium: 137 mmol/L (ref 135–145)

## 2019-11-21 LAB — HEMOGLOBIN AND HEMATOCRIT, BLOOD
HCT: 22.5 % — ABNORMAL LOW (ref 39.0–52.0)
Hemoglobin: 7.4 g/dL — ABNORMAL LOW (ref 13.0–17.0)

## 2019-11-21 LAB — ABO/RH: ABO/RH(D): A NEG

## 2019-11-21 LAB — PREPARE RBC (CROSSMATCH)

## 2019-11-21 LAB — GLUCOSE, CAPILLARY: Glucose-Capillary: 120 mg/dL — ABNORMAL HIGH (ref 70–99)

## 2019-11-21 LAB — HEMOGLOBIN A1C
Hgb A1c MFr Bld: 5.5 % (ref 4.8–5.6)
Mean Plasma Glucose: 111.15 mg/dL

## 2019-11-21 LAB — MRSA PCR SCREENING: MRSA by PCR: NEGATIVE

## 2019-11-21 MED ORDER — PAROXETINE HCL 20 MG PO TABS
20.0000 mg | ORAL_TABLET | Freq: Every day | ORAL | Status: DC
  Administered 2019-11-23 – 2019-11-29 (×7): 20 mg via ORAL
  Filled 2019-11-21 (×7): qty 1

## 2019-11-21 MED ORDER — SODIUM CHLORIDE 0.9 % IV SOLN: INTRAVENOUS | Status: DC

## 2019-11-21 MED ORDER — CHLORHEXIDINE GLUCONATE CLOTH 2 % EX PADS
6.0000 | MEDICATED_PAD | Freq: Every day | CUTANEOUS | Status: DC
  Administered 2019-11-21 – 2019-11-29 (×9): 6 via TOPICAL

## 2019-11-21 MED ORDER — NADOLOL 20 MG PO TABS
20.0000 mg | ORAL_TABLET | Freq: Every day | ORAL | Status: DC
  Filled 2019-11-21 (×3): qty 1

## 2019-11-21 MED ORDER — SODIUM CHLORIDE 0.9% IV SOLUTION
Freq: Once | INTRAVENOUS | Status: AC
  Administered 2019-11-21: 16:00:00 via INTRAVENOUS

## 2019-11-21 MED ORDER — OCTREOTIDE ACETATE 500 MCG/ML IJ SOLN
INTRAMUSCULAR | Status: AC
  Filled 2019-11-21: qty 1

## 2019-11-21 MED ORDER — NADOLOL 20 MG PO TABS
20.0000 mg | ORAL_TABLET | Freq: Every day | ORAL | Status: DC
  Filled 2019-11-21 (×2): qty 1

## 2019-11-21 MED ORDER — SODIUM CHLORIDE 0.9 % IV SOLN
Freq: Once | INTRAVENOUS | Status: AC
  Administered 2019-11-21: 14:00:00 via INTRAVENOUS

## 2019-11-21 MED ORDER — IOHEXOL 350 MG/ML SOLN
100.0000 mL | Freq: Once | INTRAVENOUS | Status: AC | PRN
  Administered 2019-11-21: 100 mL via INTRAVENOUS

## 2019-11-21 MED ORDER — SODIUM CHLORIDE 0.9 % IV BOLUS
1000.0000 mL | Freq: Once | INTRAVENOUS | Status: AC
  Administered 2019-11-21: 22:00:00 1000 mL via INTRAVENOUS

## 2019-11-21 MED ORDER — FUROSEMIDE 10 MG/ML IJ SOLN: 20.0000 mg | Freq: Once | INTRAMUSCULAR | Status: DC

## 2019-11-21 MED ORDER — SODIUM CHLORIDE 0.9 % IV SOLN
50.0000 ug/h | INTRAVENOUS | Status: AC
  Administered 2019-11-21: 25 ug/h via INTRAVENOUS
  Administered 2019-11-21 – 2019-11-27 (×15): 50 ug/h via INTRAVENOUS
  Filled 2019-11-21 (×16): qty 1

## 2019-11-21 MED ORDER — SODIUM CHLORIDE 0.9 % IV SOLN
Freq: Once | INTRAVENOUS | Status: AC
  Administered 2019-11-21: 19:00:00 via INTRAVENOUS

## 2019-11-21 NOTE — Progress Notes (Signed)
At 0430 patient transferred to ICU per Dr. Toney Sang order.  Report given to [place], RN

## 2019-11-21 NOTE — Progress Notes (Signed)
Pt with BM at 1400 and 1500. MD updated. New orders received will continue to monitor. Pt and wife update.

## 2019-11-21 NOTE — Consult Note (Addendum)
NAME:  SAMI FROH, MRN:  [id number], DOB:  Oct 22, 1966, LOS: 1 ADMISSION DATE:  11/19/2019, CONSULTATION DATE:  11/21/19 , CHIEF COMPLAINT:  GIB, Varicies   Brief History   53 y.o. M with PMH of ETOH abuse, COPD, cirrhosis and portal hypertension who presented to [facility] with coffee ground emesis two days ago.  He was admitted and seen by GI, underwent EGD which showed new varices and erosive gastritis.  He was treated with octreotide and PPI. Early this morning pt had large volume hematemesis with clots and was transferred to the ICU and then the decision was made to transfer to [facility] for IR consult.     History of present illness   Mr. Rodeheaver is a 53 y.o. M with PMH of ETOH abuse (1/5 per day), cirrhosis, HTN, and portal HTN who presented to the ED with coffee ground emesis and was found to have erosive gastritis and new oozing esophageal varices  S/p clipping x1.  Overnight 12/12, he developed large volume bright red hematemesis and was transferred to the ICU.  BP responded to volume resuscitation and given 1 unit PRBC's.  He has since had multiple episodes of maroon stool and so was transferred to [facility] ICU for CTA and IR consult.   Pt had two bloody stools on arrival, BP soft, but stable around 341 systolic.  Repeat Hgb down from 9.4 to 7.4. He is awake and oriented.  RN spoke with IR Dr. Earleen Newport who is aware of patient's arrival and bleeding.  C/o mild, diffuse abdominal pain and anxiety.   Past Medical History   has a past medical history of Anxiety, Enlarged liver, GERD (gastroesophageal reflux disease), Hypertension, Palpitations, PVC's (premature ventricular contractions), Shortness of breath, and Varicose veins.   Significant Hospital Events   12/11 Admit to hospitalists at [facility] 12/12 Hematemesis, txfr to [facility] ICU then to [facility]  Consults:  GI, IR, PCCM  Procedures:  12/11 Upper Endoscopy  Significant Diagnostic Tests:  12/11 EGD>>severe erosive gastritis, new  varices  Micro Data:  12/11 Sars-Cov-2>>negative  Antimicrobials:  Erythromycin 12/11 only Ceftriaxone 12/11-  Interim history/subjective:  Pt arrived to [facility] ICU with continued bloody BM's but stable   Objective   Blood pressure 104/62, pulse 88, temperature 98.6 F (37 C), temperature source Oral, resp. rate 17, height 6\' 1"  (1.854 m), weight 117.9 kg, SpO2 100 %.        Intake/Output Summary (Last 24 hours) at 11/21/2019 2344 Last data filed at 11/21/2019 2300 Gross per 24 hour  Intake 3084.08 ml  Output 400 ml  Net 2684.08 ml   Filed Weights   11/19/19 2037  Weight: 117.9 kg    General:  Awake and in no distress HEENT: MM pink/moist Neuro: alert and oriented, conversive and following commands CV: s1s2 rrr no m/r/g PULM:  CTAB GI: soft, bsx4 active  Extremities: warm/dry, no edema  Skin: no rashes or lesions  Resolved Hospital Problem list     Assessment & Plan:   Esophageal Varices with acute blood loss anemia -CTA results are pending, GI following and IR to consult for possible BRTO P: -Hgb dropped 2g to 7.4, transfuse 2 units PRBC's -serial h/h, 1 L LR post-contrast -Continue Octreotide and protonix gtt -NPO   Cirrhosis, likely alcoholic -tbili significantly elevated to 8.8 on admission P: -repeat CMP in the AM -continue CIWA and thiamine, folic acid, pt feels anxious/shaky though no tremors on exam -On ceftriaxone for SBP prophylaxis -per prior notes, pt  is non-compliant with Lactulose at home  Depression/Anxiety -continue Paxil    Best practice:  Diet: NPO Pain/Anxiety/Delirium protocol (if indicated): Ativan VAP protocol (if indicated): n/a DVT prophylaxis: SCD's GI prophylaxis: protonix Glucose control: SSI Mobility: bed rest Code Status: full Family Communication:  Disposition: ICU  Labs   CBC: Recent Labs  Lab 11/19/19 2123 11/20/19 0831 11/21/19 0457 11/21/19 1144 11/21/19 2200  WBC 6.6 6.2 7.1 6.0  --   HGB 14.9 12.6*  9.5* 9.4* 7.4*  HCT 43.4 37.8* 28.9* 28.5* 22.5*  MCV 106.4* 108.6* 112.0* 111.3*  --   PLT 94* 77* 86* 84*  --     Basic Metabolic Panel: Recent Labs  Lab 11/19/19 2208 11/20/19 0456 11/21/19 0457  NA 141  --  137  K 4.0  --  4.3  CL 99  --  99  CO2 29  --  29  GLUCOSE 133*  --  147*  BUN 11  --  14  CREATININE 0.71  --  1.07  CALCIUM 9.4  --  7.4*  MG  --  1.2*  --   PHOS  --  4.2  --    GFR: Estimated Creatinine Clearance: 109.9 mL/min (by C-G formula based on SCr of 1.07 mg/dL). Recent Labs  Lab 11/19/19 2123 11/20/19 0831 11/21/19 0457 11/21/19 1144  WBC 6.6 6.2 7.1 6.0    Liver Function Tests: Recent Labs  Lab 11/19/19 2208  AST 133*  ALT 39  ALKPHOS 254*  BILITOT 8.8*  PROT 8.3*  ALBUMIN 3.1*   No results for input(s): LIPASE, AMYLASE in the last 168 hours. No results for input(s): AMMONIA in the last 168 hours.  ABG    Component Value Date/Time   TCO2 24 09/18/2013 0951     Coagulation Profile: Recent Labs  Lab 11/19/19 2208  INR 1.3*    Cardiac Enzymes: No results for input(s): CKTOTAL, CKMB, CKMBINDEX, TROPONINI in the last 168 hours.  HbA1C: Hgb A1c MFr Bld  Date/Time Value Ref Range Status  11/21/2019 04:57 AM 5.5 4.8 - 5.6 % Final    Comment:    (NOTE) Pre diabetes:          5.7%-6.4% Diabetes:              >6.4% Glycemic control for   <7.0% adults with diabetes   06/01/2018 07:45 AM 5.5 4.8 - 5.6 % Final    Comment:    (NOTE) Pre diabetes:          5.7%-6.4% Diabetes:              >6.4% Glycemic control for   <7.0% adults with diabetes     CBG: Recent Labs  Lab 11/21/19 2023  GLUCAP 120*    Review of Systems:   Negative except as noted above  Past Medical History  He,  has a past medical history of Anxiety, Enlarged liver, GERD (gastroesophageal reflux disease), Hypertension, Palpitations, PVC's (premature ventricular contractions), Shortness of breath, and Varicose veins.   Surgical History    Past  Surgical History:  Procedure Laterality Date  . BIOPSY  02/02/2019   Procedure: BIOPSY;  Surgeon: Daneil Dolin, MD;  Location: [facility] ENDO [facility];  Service: Endoscopy;;  gastric  . COLONOSCOPY WITH ESOPHAGOGASTRODUODENOSCOPY (EGD)  12/16/2012   internal hemorrhoids, colonic diverticulosis, benign polyps, screening in 2024. EGD with mild erosive reflux esophagitis, small hiatal hernia, negative H.pylori  . cyst reomved     from spine  . ESOPHAGOGASTRODUODENOSCOPY  05/19/09  RMR: Geographic distal esophageal erosions consistent with severe erosive reflux esophagitis. schatzki's ring s/P dilation/small hiatal hernia otherwise normal stomach  . ESOPHAGOGASTRODUODENOSCOPY (EGD) WITH PROPOFOL N/A 02/02/2019   Dr. Gala Romney: retained gastric contents. portal HTN gastropathy  . WISDOM TOOTH EXTRACTION       Social History   reports that he has been smoking cigarettes. He started smoking about 39 years ago. He has a 45.00 pack-year smoking history. He has never used smokeless tobacco. He reports current alcohol use. He reports that he does not use drugs.   Family History   His family history includes Cancer in his mother; Colon polyps (age of onset: 78) in his sister; Heart disease in his mother. There is no history of Liver disease.   Allergies No Known Allergies   Home Medications  Prior to Admission medications   Medication Sig Start Date End Date Taking? Authorizing Provider  acebutolol (SECTRAL) 400 MG capsule Take 1 capsule (400 mg total) by mouth 2 (two) times daily. 10/13/19  Yes Herminio Commons, MD  allopurinol (ZYLOPRIM) 100 MG tablet TAKE 1 TABLET BY MOUTH EVERY DAY Patient taking differently: Take 100 mg by mouth.  07/13/19  Yes [name], MD  aspirin EC 81 MG tablet Take 81 mg by mouth daily.   Yes [provider]  cholecalciferol (VITAMIN D3) 25 MCG (1000 UT) tablet Take 1,000 Units by mouth daily.   Yes [provider]  DEXILANT 60 MG capsule TAKE 1 CAPSULE BY  MOUTH EVERY DAY Patient taking differently: Take 60 mg by mouth daily.  06/18/19  Yes [name], MD  famotidine (PEPCID) 10 MG tablet Take 10 mg by mouth as needed for heartburn or indigestion.   Yes [provider]  folic acid (FOLVITE) 762 MCG tablet Take 400 mcg by mouth daily.   Yes [provider]  gabapentin (NEURONTIN) 300 MG capsule TAKE 1 CAPSULE BY MOUTH FOUR TIMES DAILY Patient taking differently: Take 800 mg by mouth at bedtime.  08/24/19  Yes [name], MD  lactulose, encephalopathy, (CHRONULAC) 10 GM/15ML SOLN Take 15-30cc three times daily to Titrate for 3-4 soft bowel movements a day Patient taking differently: Take 15-30cc three times daily to Titrate for 3-4 soft bowel movements a day (sometimes forgets mid day dose) 08/04/19  Yes Mahala Menghini, PA-C  LORazepam (ATIVAN) 1 MG tablet Take 0.5 tablets (0.5 mg total) by mouth 2 (two) times daily as needed. for anxiety Patient taking differently: Take 1 mg by mouth 2 (two) times daily as needed for anxiety. for anxiety 07/29/19  Yes [name], MD  nortriptyline (PAMELOR) 50 MG capsule TAKE 1 CAPSULE(50 MG) BY MOUTH AT BEDTIME 03/23/19  Yes [name], MD  Omega-3 Fatty Acids (FISH OIL PO) Take 3 capsules by mouth as needed.    Yes [provider]  ondansetron (ZOFRAN-ODT) 4 MG disintegrating tablet DISSOLVE 1 TABLET ON THE TONGUE EVERY 8 HOURS AS NEEDED FOR NAUSEA 08/19/19  Yes Carlis Stable, NP  PARoxetine (PAXIL) 20 MG tablet TAKE 1 TABLET(20 MG) BY MOUTH DAILY 09/22/19  Yes [name], MD  sildenafil (VIAGRA) 100 MG tablet TAKE 1/2 TABLET BY MOUTH EVERY DAY 06/18/19  Yes [name], MD  tobramycin-dexamethasone [facility]) ophthalmic solution INSTILL 1 DROP INTO THE AFFECTED EYE TID FOR 5 DAYS 09/17/19  Yes [provider]  vitamin B-12 (CYANOCOBALAMIN) 1000 MCG tablet Take 1,000 mcg by mouth daily.   Yes [provider]  Critical care time: 55  minutes     CRITICAL CARE Performed by: Otilio Carpen Gleason   Total critical care time: 55 minutes  Critical care time was exclusive of separately billable procedures and treating other patients.  Critical care was necessary to treat or prevent imminent or life-threatening deterioration.  Critical care was time spent personally by me on the following activities: development of treatment plan with patient and/or surrogate as well as nursing, discussions with consultants, evaluation of patient's response to treatment, examination of patient, obtaining history from patient or surrogate, ordering and performing treatments and interventions, ordering and review of laboratory studies, ordering and review of radiographic studies, pulse oximetry and re-evaluation of patient's condition.   Otilio Carpen Gleason, PA-C [facility]  Pager# [phone], if no answer [phone]  Critically ill patient with variceal bleed seen at bedside and critical care planning discussed with PA Gleason.  Agree with above assessment and plan.

## 2019-11-21 NOTE — Progress Notes (Signed)
Pt given 500cc bolus of NS and fluids started at 135ml/hr for soft bps prior to transfer to [facility]. EMS arrived to take [facility] to [facility].

## 2019-11-21 NOTE — Progress Notes (Addendum)
Addendum Patient with large bloody BM and a red-dark subsequent BM; BP softer, but stable. Case once again discussed with GI and recommended transfer to [facility] for IR evaluation. Dr. Neldon Mc from IR is aware of patient situation and needs and will se him in consultation on arrival. Will transfuse 1 unit of PRBC.  Barton Dubois MD [phone]

## 2019-11-21 NOTE — Progress Notes (Signed)
Called by RN that patient vomited large amount of bright red blood with clots. Went to see patient, patient is alert, communicating.  Lethargic. Blood pressure 81/52. He had EGD done today, octreotide drip was discontinued.  Currently on IV Protonix. EGD showed erosive gastritis and questionable esophageal varices.  Plan Normal saline 1 L bolus stat CBC stat Transfer to ICU for closer monitoring Called and discussed with GI on-call Dr. Sydell Axon, who recommended to restart octreotide He will see patient in a.m.  Critical care time spent 35 minutes

## 2019-11-21 NOTE — Progress Notes (Signed)
0345 patient vomited large amount of bright red blood with multiple large blood clots.  Dr. Darrick Meigs paged to patient's room.  Order given to bolus 1 liter and transfer to ICU.

## 2019-11-21 NOTE — Progress Notes (Addendum)
PROGRESS NOTE    Bradley Miles  ZYS:[id number] DOB: 07/12/1966 DOA: 11/19/2019 PCP: [name], MD     Brief Narrative:  As per H&P written by Dr. Darrick Meigs on 11/20/19 53 y.o. male, with history of alcohol abuse, cirrhosis of the liver, COPD portal hypertension came to ED with complaints of vomiting coffee-ground emesis since yesterday morning.  Patient said that he had 15 episodes of vomiting.  He has been off his medications because he is noncompliant as per patient's wife.  He drinks 1/fifth alcohol daily.  His last drink was around 9 PM last night.  He denies taking NSAIDs. Denies chest pain Complains of some shortness of breath. Denies fever or chills. Denies abdominal pain or dysuria. No blood in the stool. He had EGD done by Dr. Sydell Axon in February 2020 which showed portal gastropathy.  In the ED, gastric occult blood was positive.  Hemoglobin has been stable at 14.8.  Hemodynamically patient is stable.  GI was consulted, Dr. Gala Romney to see patient in a.m.  Started on octreotide infusion, Protonix.  Assessment & Plan: 1-acute blood loss anemia -In the setting of gastropathy, gastric varices, erosive gastritis, esophagitis and esophageal varices. -Patient with history of alcohol cirrhosis and increased portal hypertension. -Continue IV octreotide and IV PPI -N.p.o. status -GI service had to evaluate this morning and determine the need of repeat endoscopy. -Continue to closely monitor hemoglobin and transfuse as needed. -Start nadolol 20 mg daily and follow response  2-alcohol abuse -Extensive cessation counseling has been provided -Continue CIWA protocol -Continue thiamine, folic acid. -Patient expressed interest in quitting.  3-class I obesity -Body mass index is 34.3 kg/m. -Low calorie diet, portion control and increase physical activity.  4-hyperglycemia -Will check A1c -No prior history of diabetes.  5-history of cirrhosis -Appears to be noncompliant with  medication prior to admission -Currently holding diuretics -No signs of acute encephalopathic changes -Will continue Cipro orally for SBP prophylaxis in the setting of acute bleeding.  6-depression/anxiety -continue paxil -no SI or hallucinations  DVT prophylaxis: SCDs Code Status: Full code Family Communication: No family at bedside.  Case discussed with patient directly and all questions answered. Disposition Plan: Remains inpatient, continue IV PPI, IV octreotide and follow GI service recommendation.  Hemoglobin has dropped 3 g and remains at high risk for further bleeding.  No transfusion needed at this point.  Continue supportive care  Consultants:   Gastroenterology service  Procedures:   Endoscopy: Demonstrating severe esophagitis, erosive gastritis, positive esophageal varices and antral gastropathy.  Antimicrobials:  Anti-infectives (From admission, onward)   Start     Dose/Rate Route Frequency Ordered Stop   11/20/19 1130  erythromycin 355 mg in sodium chloride 0.9 % 100 mL IVPB    Note to Pharmacy: To be given 30 minutes prior to EGD.   3 mg/kg  117.9 kg 100 mL/hr over 60 Minutes Intravenous  Once 11/20/19 1106 11/20/19 1732   11/20/19 0815  cefTRIAXone (ROCEPHIN) 2 g in sodium chloride 0.9 % 100 mL IVPB     2 g 200 mL/hr over 30 Minutes Intravenous Every 24 hours 11/20/19 0750         Subjective: No fever, no active signs of withdrawal.  Overnight with significant bright red blood hematemesis episodes and transient hypotension.  Patient ended dropping 3 g in his hemoglobin level.  No chest pain, no shortness of breath, no nausea, no palpitations, no focal deficits.  Mild discomfort in his abdomen reported.  Objective: Vitals:   11/21/19  0645 11/21/19 0700 11/21/19 0730 11/21/19 0800  BP: 114/66 116/68 116/77 117/77  Pulse: 81 82 83 83  Resp: 14 19 13  (!) 8  Temp:      TempSrc:      SpO2: 97% 97% 95% 99%  Weight:      Height:        Intake/Output  Summary (Last 24 hours) at 11/21/2019 0935 Last data filed at 11/21/2019 0800 Gross per 24 hour  Intake 718.42 ml  Output --  Net 718.42 ml   Filed Weights   11/19/19 2037  Weight: 117.9 kg    Examination: General exam: Alert, awake, oriented x 3; reporting mild abdominal discomfort; no chest pain, no shortness of breath, no nausea at this moment.  Patient is afebrile. Respiratory system: Clear to auscultation. Respiratory effort normal. Cardiovascular system:RRR. No murmurs, rubs, gallops. Gastrointestinal system: Abdomen is obese, mildly distended and tender on palpation; no guarding, positive bowel sounds. Central nervous system: Alert and oriented. No focal neurological deficits. Extremities: No C/C/E, +pedal pulses Skin: No rashes, lesions or ulcers Psychiatry: Judgement and insight appear normal. Mood & affect appropriate.     Data Reviewed: I have personally reviewed following labs and imaging studies  CBC: Recent Labs  Lab 11/19/19 2123 11/20/19 0831 11/21/19 0457  WBC 6.6 6.2 7.1  HGB 14.9 12.6* 9.5*  HCT 43.4 37.8* 28.9*  MCV 106.4* 108.6* 112.0*  PLT 94* 77* 86*   Basic Metabolic Panel: Recent Labs  Lab 11/19/19 2208 11/20/19 0456 11/21/19 0457  NA 141  --  137  K 4.0  --  4.3  CL 99  --  99  CO2 29  --  29  GLUCOSE 133*  --  147*  BUN 11  --  14  CREATININE 0.71  --  1.07  CALCIUM 9.4  --  7.4*  MG  --  1.2*  --   PHOS  --  4.2  --    GFR: Estimated Creatinine Clearance: 109.9 mL/min (by C-G formula based on SCr of 1.07 mg/dL).   Liver Function Tests: Recent Labs  Lab 11/19/19 2208  AST 133*  ALT 39  ALKPHOS 254*  BILITOT 8.8*  PROT 8.3*  ALBUMIN 3.1*   Coagulation Profile: Recent Labs  Lab 11/19/19 2208  INR 1.3*   Urine analysis:    Component Value Date/Time   COLORURINE YELLOW 06/03/2018 [place] 06/03/2018 1555   LABSPEC 1.017 06/03/2018 1555   PHURINE 5.0 06/03/2018 1555   GLUCOSEU NEGATIVE 06/03/2018  1555   HGBUR NEGATIVE 06/03/2018 1555   BILIRUBINUR NEGATIVE 06/03/2018 1555   National 06/03/2018 1555   PROTEINUR NEGATIVE 06/03/2018 1555   NITRITE NEGATIVE 06/03/2018 1555   LEUKOCYTESUR NEGATIVE 06/03/2018 1555    Recent Results (from the past 240 hour(s))  SARS Coronavirus 2 by RT PCR (hospital order, performed in [place] hospital lab)     Status: None   Collection Time: 11/19/19  9:33 PM  Result Value Ref Range Status   SARS Coronavirus 2 NEGATIVE NEGATIVE Final    Comment: (NOTE) SARS-CoV-2 target nucleic acids are NOT DETECTED. The SARS-CoV-2 RNA is generally detectable in upper and lower respiratory specimens during the acute phase of infection. The lowest concentration of SARS-CoV-2 viral copies this assay can detect is 250 copies / mL. A negative result does not preclude SARS-CoV-2 infection and should not be used as the sole basis for treatment or other patient management decisions.  A negative result may occur with  improper specimen collection / handling, submission of specimen other than nasopharyngeal swab, presence of viral mutation(s) within the areas targeted by this assay, and inadequate number of viral copies (<250 copies / mL). A negative result must be combined with clinical observations, patient history, and epidemiological information. Fact Sheet for Patients:   StrictlyIdeas.no Fact Sheet for Healthcare Providers: BankingDealers.co.za This test is not yet approved or cleared  by the [place] FDA and has been authorized for detection and/or diagnosis of SARS-CoV-2 by FDA under an Emergency Use Authorization (EUA).  This EUA will remain in effect (meaning this test can be used) for the duration of the COVID-19 declaration under Section 564(b)(1) of the Act, 21 U.S.C. section 360bbb-3(b)(1), unless the authorization is terminated or revoked sooner. Performed at [facility], [place] [address] [place], [place] [address]   MRSA PCR Screening     Status: None   Collection Time: 11/21/19  4:43 AM   Specimen: Nasal Mucosa; Nasopharyngeal  Result Value Ref Range Status   MRSA by PCR NEGATIVE NEGATIVE Final    Comment:        The GeneXpert MRSA Assay (FDA approved for NASAL specimens only), is one component of a comprehensive MRSA colonization surveillance program. It is not intended to diagnose MRSA infection nor to guide or monitor treatment for MRSA infections. Performed at [facility], [address], [place], [place] [address]      Radiology Studies: No results found.   Scheduled Meds: . Chlorhexidine Gluconate Cloth  6 each Topical Daily  . folic acid  1 mg Oral Daily  . multivitamin with minerals  1 tablet Oral Daily  . [START ON 11/23/2019] pantoprazole  40 mg Intravenous Q12H  . sucralfate  1 g Oral TID WC & HS  . thiamine  100 mg Oral Daily   Or  . thiamine  100 mg Intravenous Daily   Continuous Infusions: . cefTRIAXone (ROCEPHIN)  IV 2 g (11/21/19 0854)  . octreotide  (SANDOSTATIN)    IV infusion 25 mcg/hr (11/21/19 0700)  . pantoprozole (PROTONIX) infusion 8 mg/hr (11/21/19 0730)     LOS: 1 day    Time spent: 35 minutes. Greater than 50% of this time was spent in direct contact with the patient, coordinating care and discussing relevant ongoing clinical issues, including extensive discussion about the need for alcohol cessation, once again review of previous results from endoscopy done on 11/20/2019, the likelihood of decompensation is/active bleeding from esophageal varices based on hematemesis overnight and the drop of roughly 3 g of hemoglobin during this acute episode of bleeding.  Patient vital signs has stabilized with supportive measures and is currently stable.  She reports mild abdominal discomfort.  No chest pain, no nausea, no palpitations, no shortness of breath.  We will continue current IV PPI, IV octreotide, close monitoring of  hemoglobin trend (no requiring transfusion at this time) and follow further GI recommendations.     Barton Dubois, MD [facility] Pager [phone]   11/21/2019, 9:35 AM

## 2019-11-21 NOTE — Progress Notes (Signed)
Patient ate a small amount of regular meal tray last evening  -   did well until about 0300 this morning developed acute hematemesis-vomiting about 500 cc.  Systolic pressure transiently dipped down to 60.  Was given IV fluid resuscitation/ moved to the ICU.  Hemoglobin 14.9-admit, 12.6-24 hours ago; 9.5 this morning  No further hematemesis in the ICU -current blood pressure 113/72.  No abdominal pain has not had a bowel movement. Sandostatin infusion currently at 25 mcg/h  Vital signs in last 24 hours: Temp:  [98.4 F (36.9 C)-98.7 F (37.1 C)] 98.4 F (36.9 C) (12/12 0630) Pulse Rate:  [81-102] 84 (12/12 0900) Resp:  [8-19] 12 (12/12 0900) BP: (54-143)/(29-90) 113/72 (12/12 0900) SpO2:  [81 %-99 %] 94 % (12/12 0900) Last BM Date: 11/16/19 General:   He is alert conversant appears to be in no distress whatsoever.   Abdomen:  Soft, nontender and nondistended.  Normal bowel sounds, without guarding, and without rebound.  No mass or organomegaly. Extremities:  Without clubbing or edema.    Intake/Output from previous day: 12/11 0701 - 12/12 0700 In: 681 [I.V.:481; IV Piggyback:200] Out: -  Intake/Output this shift: Total I/O In: 93.2 [I.V.:75; IV Piggyback:18.2] Out: -   Lab Results: Recent Labs    11/19/19 2123 11/20/19 0831 11/21/19 0457  WBC 6.6 6.2 7.1  HGB 14.9 12.6* 9.5*  HCT 43.4 37.8* 28.9*  PLT 94* 77* 86*   BMET Recent Labs    11/19/19 2208 11/21/19 0457  NA 141 137  K 4.0 4.3  CL 99 99  CO2 29 29  GLUCOSE 133* 147*  BUN 11 14  CREATININE 0.71 1.07  CALCIUM 9.4 7.4*   LFT Recent Labs    11/19/19 2208  PROT 8.3*  ALBUMIN 3.1*  AST 133*  ALT 39  ALKPHOS 254*  BILITOT 8.8*   PT/INR Recent Labs    11/19/19 2208  LABPROT 16.0*  INR 1.3*    Impression: 53 year old gentleman longstanding alcoholism/alcoholic induced cirrhosis admitted with upper GI bleed (Childs-Pugh class A)  EGD yesterday demonstrated innocent appearing grade 1-2  esophageal varices (new) and severe erosive reflux esophagitis.  New gastric varices,  congestive portal gastropathy.  Area of oozing mucosa distal to overt varices status post clipping x1 yesterday.  In retrospect, gastric varices may be more extensive.  Recurrent relatively large volume hematemesis is worrisome.  Recommendations:  Allow sips of clear liquids; otherwise n.p.o.  Continue Sandostatin at 50 mcg/h  Continue antibiotics, PPI and Carafate.  Follow H&H; would withhold transfusion unless hemoglobin dips below 7 or significant recurrent bleeding.  I do not feel definitive treatment of gastric varices can be accomplished with a repeat EGD; We do not have cyanoacrylate available for injection.  Patient had multiple lobulated varices in the cardia/fundus.  Patient may be best served by seeing IR for possible TIPS versus BRTO.  I have placed a call into interventional radiology.  I am waiting for the on-call radiologist to get me back.  I discussed with Dr. Dyann Kief.

## 2019-11-21 NOTE — Progress Notes (Signed)
Spoke to Dr. Earleen Newport, on call IR in [place].  Discussed case.  He felt BRTO is going to be the way to go either emergently or in the near future via clinic consultation. He suggested proceeding with a CT angiogram (BRTO) to help establish a "roadmap" for procedure. If he rebleeds over the weekend, we mutually agreed he will be transferred to [facility] urgently.  If not, care will be supportive here with early interval follow-up in the IR clinic. I recommend starting Nadolol other nonspecific beta-blocker today if okay with cardiology.  Addendum: ICU nursing staff just called me as I was completing my note stating that patient had a large grossly bloody BM systolic blood pressures dropped to 90. With this updated information, we will hold off on a CT angiogram here and get him expeditiously transferred down to [facility].  IR (Dr. Earleen Newport) should be consulted.  He is aware.

## 2019-11-21 NOTE — Progress Notes (Signed)
Pt had bright red bloody stool. Dr. Dyann Kief made aware and also Dr. Gala Romney made aware. Pt's BP soft but stable.  New orders obtained. Advised pt needs to be seen at [facility] by Dr Neldon Mc who is aware of pt. Will continue to monitor and update pt and pt's wife.

## 2019-11-22 ENCOUNTER — Inpatient Hospital Stay (HOSPITAL_COMMUNITY): Payer: Managed Care, Other (non HMO)

## 2019-11-22 ENCOUNTER — Encounter (HOSPITAL_COMMUNITY)
Admission: EM | Disposition: A | Discharge status: Home / Self Care | Payer: Self-pay | Source: Home / Self Care | Attending: Pulmonary Disease

## 2019-11-22 ENCOUNTER — Inpatient Hospital Stay (HOSPITAL_COMMUNITY): Payer: Managed Care, Other (non HMO) | Admitting: Certified Registered"

## 2019-11-22 DIAGNOSIS — I8501 Esophageal varices with bleeding: Secondary | ICD-10-CM

## 2019-11-22 DIAGNOSIS — K920 Hematemesis: Secondary | ICD-10-CM

## 2019-11-22 DIAGNOSIS — F1020 Alcohol dependence, uncomplicated: Secondary | ICD-10-CM

## 2019-11-22 DIAGNOSIS — I8500 Esophageal varices without bleeding: Secondary | ICD-10-CM

## 2019-11-22 DIAGNOSIS — D62 Acute posthemorrhagic anemia: Secondary | ICD-10-CM

## 2019-11-22 DIAGNOSIS — F1010 Alcohol abuse, uncomplicated: Secondary | ICD-10-CM

## 2019-11-22 HISTORY — PX: IR ANGIOGRAM SELECTIVE EACH ADDITIONAL VESSEL: IMG667

## 2019-11-22 HISTORY — PX: IR EMBO ART  VEN HEMORR LYMPH EXTRAV  INC GUIDE ROADMAPPING: IMG5450

## 2019-11-22 HISTORY — PX: IR US GUIDE VASC ACCESS RIGHT: IMG2390

## 2019-11-22 HISTORY — PX: IR VENOGRAM RENAL UNI LEFT: IMG680

## 2019-11-22 HISTORY — PX: TIPS PROCEDURE: SHX808

## 2019-11-22 LAB — COMPREHENSIVE METABOLIC PANEL
ALT: 20 U/L (ref 0–44)
AST: 71 U/L — ABNORMAL HIGH (ref 15–41)
Albumin: 1.7 g/dL — ABNORMAL LOW (ref 3.5–5.0)
Alkaline Phosphatase: 104 U/L (ref 38–126)
Anion gap: 11 (ref 5–15)
BUN: 18 mg/dL (ref 6–20)
CO2: 20 mmol/L — ABNORMAL LOW (ref 22–32)
Calcium: 6.6 mg/dL — ABNORMAL LOW (ref 8.9–10.3)
Chloride: 108 mmol/L (ref 98–111)
Creatinine, Ser: 0.94 mg/dL (ref 0.61–1.24)
GFR calc Af Amer: >60 mL/min (ref >60)
GFR calc non Af Amer: >60 mL/min (ref >60)
Glucose, Bld: 119 mg/dL — ABNORMAL HIGH (ref 70–99)
Potassium: 3.8 mmol/L (ref 3.5–5.1)
Sodium: 139 mmol/L (ref 135–145)
Total Bilirubin: 5.8 mg/dL — ABNORMAL HIGH (ref 0.3–1.2)
Total Protein: 4.6 g/dL — ABNORMAL LOW (ref 6.5–8.1)

## 2019-11-22 LAB — HEMOGLOBIN AND HEMATOCRIT, BLOOD
HCT: 24.6 % — ABNORMAL LOW (ref 39.0–52.0)
HCT: 18.6 % — ABNORMAL LOW (ref 39.0–52.0)
HCT: 22.7 % — ABNORMAL LOW (ref 39.0–52.0)
Hemoglobin: 8.4 g/dL — ABNORMAL LOW (ref 13.0–17.0)
Hemoglobin: 6.5 g/dL — CL (ref 13.0–17.0)
Hemoglobin: 7.5 g/dL — ABNORMAL LOW (ref 13.0–17.0)

## 2019-11-22 LAB — TYPE AND SCREEN
ABO/RH(D): A NEG
Antibody Screen: NEGATIVE
Unit division: 0

## 2019-11-22 LAB — PROTIME-INR
INR: 1.8 — ABNORMAL HIGH (ref 0.8–1.2)
Prothrombin Time: 20.6 seconds — ABNORMAL HIGH (ref 11.4–15.2)

## 2019-11-22 LAB — FIBRINOGEN: Fibrinogen: 208 mg/dL — ABNORMAL LOW (ref 210–475)

## 2019-11-22 LAB — BPAM RBC
Blood Product Expiration Date: 202012252359
ISSUE DATE / TIME: 202012121553
Unit Type and Rh: 600

## 2019-11-22 LAB — GLUCOSE, CAPILLARY: Glucose-Capillary: 114 mg/dL — ABNORMAL HIGH (ref 70–99)

## 2019-11-22 LAB — MAGNESIUM: Magnesium: 0.8 mg/dL — CL (ref 1.7–2.4)

## 2019-11-22 LAB — PREPARE RBC (CROSSMATCH)

## 2019-11-22 LAB — PHOSPHORUS: Phosphorus: 2.5 mg/dL (ref 2.5–4.6)

## 2019-11-22 LAB — ABO/RH: ABO/RH(D): A NEG

## 2019-11-22 SURGERY — INSERTION, SHUNT, INTRAHEPATIC PORTOSYSTEMIC, TRANSJUGULAR
Anesthesia: General

## 2019-11-22 MED ORDER — SODIUM CHLORIDE 0.9% IV SOLUTION: Freq: Once | INTRAVENOUS | Status: DC

## 2019-11-22 MED ORDER — ALBUMIN HUMAN 25 % IV SOLN
25.0000 g | Freq: Once | INTRAVENOUS | Status: AC
  Administered 2019-11-22: 11:00:00 25 g via INTRAVENOUS
  Filled 2019-11-22: qty 100

## 2019-11-22 MED ORDER — SODIUM CHLORIDE 0.9% IV SOLUTION: Freq: Once | INTRAVENOUS | Status: AC

## 2019-11-22 MED ORDER — CEFAZOLIN SODIUM-DEXTROSE 2-4 GM/100ML-% IV SOLN
INTRAVENOUS | Status: AC
  Filled 2019-11-22: qty 100

## 2019-11-22 MED ORDER — DEXTROSE 5 % IV SOLN
3.0000 g | INTRAVENOUS | Status: AC
  Filled 2019-11-22 (×2): qty 3000

## 2019-11-22 MED ORDER — CEFAZOLIN SODIUM-DEXTROSE 2-3 GM-%(50ML) IV SOLR
INTRAVENOUS | Status: DC | PRN
  Administered 2019-11-22: 2 g via INTRAVENOUS

## 2019-11-22 MED ORDER — ROCURONIUM BROMIDE 50 MG/5ML IV SOSY
PREFILLED_SYRINGE | INTRAVENOUS | Status: DC | PRN
  Administered 2019-11-22 (×3): 30 mg via INTRAVENOUS

## 2019-11-22 MED ORDER — VASOPRESSIN 20 UNIT/ML IV SOLN
INTRAVENOUS | Status: DC | PRN
  Administered 2019-11-22: 1 [IU] via INTRAVENOUS
  Administered 2019-11-22: 1 m[IU] via INTRAVENOUS
  Administered 2019-11-22 (×2): 1 [IU] via INTRAVENOUS
  Administered 2019-11-22: 1 m[IU] via INTRAVENOUS
  Administered 2019-11-22 (×4): 1 [IU] via INTRAVENOUS

## 2019-11-22 MED ORDER — FENTANYL CITRATE (PF) 100 MCG/2ML IJ SOLN
INTRAMUSCULAR | Status: DC | PRN
  Administered 2019-11-22 (×2): 50 ug via INTRAVENOUS

## 2019-11-22 MED ORDER — LIPIODOL ULTRAFLUID INJECTION
INTRAMUSCULAR | Status: AC | PRN
  Administered 2019-11-22: 5 mL

## 2019-11-22 MED ORDER — SODIUM CHLORIDE 0.9 % IV SOLN: 250.0000 mL | INTRAVENOUS | Status: DC

## 2019-11-22 MED ORDER — VASOPRESSIN 20 UNIT/ML IV SOLN
INTRAVENOUS | Status: AC
  Filled 2019-11-22: qty 1

## 2019-11-22 MED ORDER — LIDOCAINE HCL (CARDIAC) PF 100 MG/5ML IV SOSY
PREFILLED_SYRINGE | INTRAVENOUS | Status: DC | PRN
  Administered 2019-11-22: 30 mg via INTRAVENOUS

## 2019-11-22 MED ORDER — FENTANYL CITRATE (PF) 100 MCG/2ML IJ SOLN
INTRAMUSCULAR | Status: AC
  Filled 2019-11-22: qty 2

## 2019-11-22 MED ORDER — SODIUM PHOSPHATES 45 MMOLE/15ML IV SOLN
20.0000 mmol | Freq: Once | INTRAVENOUS | Status: AC
  Administered 2019-11-22: 20 mmol via INTRAVENOUS
  Filled 2019-11-22: qty 6.67

## 2019-11-22 MED ORDER — LACTATED RINGERS IV BOLUS
1000.0000 mL | Freq: Once | INTRAVENOUS | Status: AC
  Administered 2019-11-22: 1000 mL via INTRAVENOUS

## 2019-11-22 MED ORDER — PHENYLEPHRINE HCL-NACL 10-0.9 MG/250ML-% IV SOLN
INTRAVENOUS | Status: DC | PRN
  Administered 2019-11-22: 50 ug/min via INTRAVENOUS

## 2019-11-22 MED ORDER — PHENYLEPHRINE HCL-NACL 10-0.9 MG/250ML-% IV SOLN
25.0000 ug/min | INTRAVENOUS | Status: DC
  Administered 2019-11-22 (×2): 25 ug/min via INTRAVENOUS
  Filled 2019-11-22 (×2): qty 250

## 2019-11-22 MED ORDER — SUCCINYLCHOLINE CHLORIDE 20 MG/ML IJ SOLN
INTRAMUSCULAR | Status: DC | PRN
  Administered 2019-11-22: 100 mg via INTRAVENOUS

## 2019-11-22 MED ORDER — SUGAMMADEX SODIUM 200 MG/2ML IV SOLN
INTRAVENOUS | Status: DC | PRN
  Administered 2019-11-22: 300 mg via INTRAVENOUS

## 2019-11-22 MED ORDER — MAGNESIUM SULFATE 2 GM/50ML IV SOLN
2.0000 g | Freq: Once | INTRAVENOUS | Status: AC
  Administered 2019-11-22: 16:00:00 2 g via INTRAVENOUS
  Filled 2019-11-22: qty 50

## 2019-11-22 MED ORDER — LACTATED RINGERS IV SOLN
INTRAVENOUS | Status: DC | PRN
  Administered 2019-11-22: 14:00:00 via INTRAVENOUS

## 2019-11-22 MED ORDER — IOHEXOL 300 MG/ML  SOLN
150.0000 mL | Freq: Once | INTRAMUSCULAR | Status: AC | PRN
  Administered 2019-11-22: 14:00:00 90 mL via INTRAVENOUS

## 2019-11-22 MED ORDER — SODIUM CHLORIDE 0.9 % IV SOLN
INTRAVENOUS | Status: DC | PRN
  Administered 2019-11-22: 12:00:00 via INTRAVENOUS

## 2019-11-22 NOTE — Sedation Documentation (Signed)
PACU aware of need for recovery.

## 2019-11-22 NOTE — Anesthesia Procedure Notes (Signed)
Central Venous Catheter Insertion Performed by: Murvin Natal, MD, anesthesiologist Start/End12/13/2020 11:45 AM, 11/22/2019 12:00 PM Patient location: Pre-op. Preanesthetic checklist: patient identified, IV checked, site marked, risks and benefits discussed, surgical consent, monitors and equipment checked, pre-op evaluation, timeout performed and anesthesia consent Position: Trendelenburg Patient sedated Hand hygiene performed , maximum sterile barriers used  and Seldinger technique used Catheter size: 8 Fr Total catheter length 16. Central line was placed.Double lumen Procedure performed using ultrasound guided technique. Ultrasound Notes:anatomy identified, needle tip was noted to be adjacent to the nerve/plexus identified, no ultrasound evidence of intravascular and/or intraneural injection and image(s) printed for medical record Attempts: 1 Following insertion, dressing applied, line sutured and Biopatch. Post procedure assessment: blood return through all ports, free fluid flow and no air  Patient tolerated the procedure well with no immediate complications.

## 2019-11-22 NOTE — Anesthesia Postprocedure Evaluation (Signed)
Anesthesia Post Note  Patient: Bradley Miles  Procedure(s) Performed: BRTO/TRANS-JUGULAR INTRAHEPATIC PORTAL SHUNT (TIPS) (N/A )     Patient location during evaluation: PACU Anesthesia Type: General Level of consciousness: awake and alert Pain management: pain level controlled Vital Signs Assessment: post-procedure vital signs reviewed and stable Respiratory status: spontaneous breathing, nonlabored ventilation, respiratory function stable and patient connected to nasal cannula oxygen Cardiovascular status: blood pressure returned to baseline and stable Postop Assessment: no apparent nausea or vomiting Anesthetic complications: no    Last Vitals:  Vitals:   11/22/19 1434 11/22/19 1441  BP: 117/60   Pulse: (!) 102 (!) 102  Resp: 12 14  Temp:  36.9 C  SpO2: 97% 95%    Last Pain:  Vitals:   11/22/19 1434  TempSrc:   PainSc: 0-No pain                 Jayesh Marbach P Lamia Mariner

## 2019-11-22 NOTE — Progress Notes (Signed)
[place] Progress Note Patient Name: FLYNN GWYN DOB: [birth year] MRN: [id number]   Date of Service  11/22/2019  HPI/Events of Note  Patient transferred from [facility] ICU to [facility] ICU for recurrent UGIB after having already had 1x clip placed during EGD which found new oozing esophageal varices and erosive gastritis.  Spoke with patient via remote access camera. Patient is stable and comfortable on arrival. He is receiving Protonix drip + carafate, octreotide drip, SBP ppx with Ceftriaxone, and we are monitoring serial CBCs. Last Hgb was 7.4 (down from 9.4) but he has since received 2u pRBCs.  eICU Interventions  I have added on a fibrinogen level to his labs given his history of cirrhosis. He will have IR consultation today.   Recommend taking caution to avoid volume overload which will tend to increase portal pressures and increase risk of bleeding. Will discontinue mIVF (NS @ 100cc/hr) for this reason.     Intervention Category Evaluation Type: New Patient Evaluation  Marily Lente Shenekia Riess 11/22/2019, 6:05 AM

## 2019-11-22 NOTE — Sedation Documentation (Signed)
[facility] called to [place], [place]. All questions answered.

## 2019-11-22 NOTE — Progress Notes (Signed)
Called GI on call due to patient being in a pool of blood after his BRTO from PACU. Awaiting orders from GI now and will monitor closely. Modena Morrow E, RN 11/22/2019 3:05 PM

## 2019-11-22 NOTE — Progress Notes (Signed)
CRITICAL VALUE ALERT  Critical Value:  Mag 0.8  Date & Time Notied:  11:16 AM 11/22/19  Provider Notified: Dr. Ander Slade  Orders Received/Actions taken: See [facility]

## 2019-11-22 NOTE — Consult Note (Addendum)
Chief Complaint: Patient was seen in consultation today for BRTO/TIPs  Chief Complaint  Patient presents with  . Hematemesis   at the request of Dr Karren Burly    Supervising Physician: Corrie Mckusick  Patient Status: [name]  History of Present Illness: Bradley Miles is a 53 y.o. male   Hx Etoh abuse ( no alcohol since Tues) No Hx Hepatitis Portal HTN To [facility] with coffee ground emesis and pain Bloody BMs Dr Sydell Axon performed scope and clipped esophageal variceal lesion   Severe erosive reflux esophagitis with linear erosions coming up 10 cm into the distal esophagus from the GE junction. Circumferential confluent erosions within 2 cm of the GE junction. Friable mucosa. Patient now has 4 columns of grade 1-2 esophageal varices - distal esophagus without bleeding stigmata. Stomach empty. Marked portal gastropathy; congested friable mucosa diffusely. Lobulated fundal/cardia varices -somewhat bulging; area of active oozing in the cardia without apparent underlying varix seen. This bleeding area was clipped x1 with good hemostasis. No ulcer seen. Normal bulb and second portion Findings: -Severe erosive reflux esophagitis. Finding of esophageal varices - new since last EGD (no bleeding stigmata). Gastric varices - new since last EGD. Marked portal gastropathy with mucosal bleeding treated as described above.  Continued bleeding rectally Transferred to [facility] Dr Sydell Axon spoke to DR Earleen Newport  Need to consider BRTO- possible TIPS  Pt has received 3 units total transfusion Hg 8.4 now BP now in 70s HR tachy  New bloody BM- bright red  Dr Earleen Newport consulted with pt at bedside     Past Medical History:  Diagnosis Date  . Anxiety   . Enlarged liver   . GERD (gastroesophageal reflux disease)   . Hypertension   . Palpitations   . PVC's (premature ventricular contractions)   . Shortness of breath   . Varicose veins     Past Surgical History:  Procedure Laterality  Date  . BIOPSY  02/02/2019   Procedure: BIOPSY;  Surgeon: Daneil Dolin, MD;  Location: [facility] SUITE;  Service: Endoscopy;;  gastric  . COLONOSCOPY WITH ESOPHAGOGASTRODUODENOSCOPY (EGD)  12/16/2012   internal hemorrhoids, colonic diverticulosis, benign polyps, screening in 2024. EGD with mild erosive reflux esophagitis, small hiatal hernia, negative H.pylori  . cyst reomved     from spine  . ESOPHAGOGASTRODUODENOSCOPY  05/19/09   RMR: Geographic distal esophageal erosions consistent with severe erosive reflux esophagitis. schatzki's ring s/P dilation/small hiatal hernia otherwise normal stomach  . ESOPHAGOGASTRODUODENOSCOPY (EGD) WITH PROPOFOL N/A 02/02/2019   Dr. Gala Romney: retained gastric contents. portal HTN gastropathy  . WISDOM TOOTH EXTRACTION      Allergies: Patient has no known allergies.  Medications: Prior to Admission medications   Medication Sig Start Date End Date Taking? Authorizing Provider  acebutolol (SECTRAL) 400 MG capsule Take 1 capsule (400 mg total) by mouth 2 (two) times daily. 10/13/19  Yes Herminio Commons, MD  allopurinol (ZYLOPRIM) 100 MG tablet TAKE 1 TABLET BY MOUTH EVERY DAY Patient taking differently: Take 100 mg by mouth.  07/13/19  Yes [name], MD  aspirin EC 81 MG tablet Take 81 mg by mouth daily.   Yes [provider]  cholecalciferol (VITAMIN D3) 25 MCG (1000 UT) tablet Take 1,000 Units by mouth daily.   Yes [provider]  DEXILANT 60 MG capsule TAKE 1 CAPSULE BY MOUTH EVERY DAY Patient taking differently: Take 60 mg by mouth daily.  06/18/19  Yes [name], MD  famotidine (PEPCID) 10 MG  tablet Take 10 mg by mouth as needed for heartburn or indigestion.   Yes [provider]  folic acid (FOLVITE) 268 MCG tablet Take 400 mcg by mouth daily.   Yes [provider]  gabapentin (NEURONTIN) 300 MG capsule TAKE 1 CAPSULE BY MOUTH FOUR TIMES DAILY Patient taking differently: Take 800 mg by mouth at bedtime.   08/24/19  Yes [name], MD  lactulose, encephalopathy, (CHRONULAC) 10 GM/15ML SOLN Take 15-30cc three times daily to Titrate for 3-4 soft bowel movements a day Patient taking differently: Take 15-30cc three times daily to Titrate for 3-4 soft bowel movements a day (sometimes forgets mid day dose) 08/04/19  Yes Mahala Menghini, PA-C  LORazepam (ATIVAN) 1 MG tablet Take 0.5 tablets (0.5 mg total) by mouth 2 (two) times daily as needed. for anxiety Patient taking differently: Take 1 mg by mouth 2 (two) times daily as needed for anxiety. for anxiety 07/29/19  Yes [name], MD  nortriptyline (PAMELOR) 50 MG capsule TAKE 1 CAPSULE(50 MG) BY MOUTH AT BEDTIME 03/23/19  Yes [name], MD  Omega-3 Fatty Acids (FISH OIL PO) Take 3 capsules by mouth as needed.    Yes [provider]  ondansetron (ZOFRAN-ODT) 4 MG disintegrating tablet DISSOLVE 1 TABLET ON THE TONGUE EVERY 8 HOURS AS NEEDED FOR NAUSEA 08/19/19  Yes Carlis Stable, NP  PARoxetine (PAXIL) 20 MG tablet TAKE 1 TABLET(20 MG) BY MOUTH DAILY 09/22/19  Yes [name], MD  sildenafil (VIAGRA) 100 MG tablet TAKE 1/2 TABLET BY MOUTH EVERY DAY 06/18/19  Yes [name], MD  tobramycin-dexamethasone [facility]) ophthalmic solution INSTILL 1 DROP INTO THE AFFECTED EYE TID FOR 5 DAYS 09/17/19  Yes [provider]  vitamin B-12 (CYANOCOBALAMIN) 1000 MCG tablet Take 1,000 mcg by mouth daily.   Yes [provider]     Family History  Problem Relation Age of Onset  . Cancer Mother        ?etiology  . Heart disease Mother   . Colon polyps Sister 43       two sisters  . Liver disease Neg Hx     Social History   Socioeconomic History  . Marital status: Married    Spouse name: Not on file  . Number of children: 2  . Years of education: Not on file  . Highest education level: Not on file  Occupational History  . 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  Smoking status: Current Every Day Smoker    Packs/day: 1.50    Years: 30.00    Pack years: 45.00    Types: Cigarettes    Start date: 12/24/1979  . Smokeless tobacco: Never Used  Substance and Sexual Activity  . Alcohol use: Yes    Alcohol/week: 0.0 standard drinks    Comment: drinks 2 5th/daily  . Drug use: No  . Sexual activity: Yes    Partners: Female    Birth control/protection: None  Other Topics Concern  . Not on file  Social History Narrative   Wife at bedside,    Social Determinants of Health   Financial Resource Strain:   . Difficulty of Paying Living Expenses: Not on file  Food Insecurity:   . Worried About Charity fundraiser in the Last Year: Not on file  . Ran Out of Food in the Last Year: Not on file  Transportation Needs:   . Lack of Transportation (Medical):  Not on file  . Lack of Transportation (Non-Medical): Not on file  Physical Activity:   . Days of Exercise per Week: Not on file  . Minutes of Exercise per Session: Not on file  Stress:   . Feeling of Stress : Not on file  Social Connections:   . Frequency of Communication with Friends and Family: Not on file  . Frequency of Social Gatherings with Friends and Family: Not on file  . Attends Religious Services: Not on file  . Active Member of Clubs or Organizations: Not on file  . Attends Archivist Meetings: Not on file  . Marital Status: Not on file     Review of Systems: A 12 point ROS discussed and pertinent positives are indicated in the HPI above.  All other systems are negative.  Review of Systems  Constitutional: Positive for activity change. Negative for fatigue and unexpected weight change.  Respiratory: Negative for shortness of breath and wheezing.   Cardiovascular: Negative for chest pain.  Gastrointestinal: Positive for anal bleeding, blood in stool and vomiting. Negative for abdominal pain.  Neurological: Positive for weakness.  Psychiatric/Behavioral: Negative for  behavioral problems and confusion.    Vital Signs: BP (!) 72/51   Pulse (!) 112   Temp 98.4 F (36.9 C) (Oral)   Resp 12   Ht 6\' 1"  (1.854 m)   Wt 262 lb 5.6 oz (119 kg)   SpO2 91%   BMI 34.61 kg/m   Physical Exam Vitals reviewed.  Cardiovascular:     Rate and Rhythm: Normal rate and regular rhythm.  Pulmonary:     Breath sounds: Normal breath sounds.  Abdominal:     Palpations: Abdomen is soft.     Tenderness: There is no abdominal tenderness.  Musculoskeletal:        General: Normal range of motion.  Skin:    General: Skin is warm and dry.  Neurological:     Mental Status: He is alert and oriented to person, place, and time.  Psychiatric:        Mood and Affect: Mood normal.        Behavior: Behavior normal.        Thought Content: Thought content normal.        Judgment: Judgment normal.     Imaging: CT Angio Abd/Pel w/ and/or w/o  Result Date: 11/22/2019 CLINICAL DATA:  GI bleed with large bloody bowel movement EXAM: CTA ABDOMEN AND PELVIS WITHOUT AND WITH CONTRAST TECHNIQUE: Multidetector CT imaging of the abdomen and pelvis was performed using the standard protocol during bolus administration of intravenous contrast. Multiplanar reconstructed images and MIPs were obtained and reviewed to evaluate the vascular anatomy. CONTRAST:  100 mL Omnipaque 350. COMPARISON:  06/02/2018 FINDINGS: VASCULAR Aorta: Abdominal aorta is well visualize without aneurysmal dilatation or focal dissection. Minimal atherosclerotic changes are noted. Celiac: Celiac axis is widely patent. The main celiac axis supplies the splenic artery as well as the right hepatic artery. Variant anatomy is noted with a smaller branch arising directly from the aorta just above the celiac axis supplying the left hepatic artery as well as left gastric artery. SMA: Patent without evidence of aneurysm, dissection, vasculitis or significant stenosis. Renals: Both renal arteries are patent without evidence of  aneurysm, dissection, vasculitis, fibromuscular dysplasia or significant stenosis. IMA: Patent without evidence of aneurysm, dissection, vasculitis or significant stenosis. Iliacs: Atherosclerotic changes are noted without focal stenosis Veins: IVC appears within normal limits. Multiple gastric varices are identified near the  gastroesophageal junction and along the posterior aspect of the gastric fundus. The largest of these varices measures at least 1.8 cm in greatest dimension. No significant ascending esophageal varices are noted. The gastric varices seen drain into the left renal vein. No other venous abnormality is noted. Portal vein is widely patent. Small coronary vein is seen. Review of the MIP images confirms the above findings. NON-VASCULAR Lower chest: No acute abnormality. Hepatobiliary: Liver is well visualized. Mild heterogeneity of the liver is seen without focal mass. Very mild nodularity is noted. The gallbladder is well distended with dependent gallstones. No complicating factors are seen. Mild perihepatic fluid is noted new from prior exam. Pancreas: Pancreas appears within normal limits. Spleen: Spleen shows no focal mass lesion. Splenic venous branches communicate with the gastric varices as well. Adrenals/Urinary Tract: The adrenal glands are within normal limits. Kidneys are well visualized bilaterally. No renal calculi or obstructive changes are seen. Delayed images demonstrate a normal enhancement pattern as well. The bladder is partially distended. Stomach/Bowel: Colon is well visualized. Scattered diverticular change is seen without evidence of diverticulitis. The appendix appears within normal limits. Small bowel is unremarkable. No area to suggest active hemorrhage in the small bowel or colon is seen. Stomach is well distended with water administered right before the exam. Some swirling of air and density is noted within the dependent portion of the stomach. This could be related to  thrombus given the known gastric varices. Endoscopic clip is noted adjacent to region of varices posteriorly. Lymphatic: No significant lymphadenopathy is noted. Reproductive: Prostate is unremarkable. Other: No abdominal wall hernia or abnormality. Minimal perihepatic fluid is noted as previously mentioned. Musculoskeletal: Bilateral pars defects are noted at L5 with only minimal anterolisthesis. No acute bony abnormality is noted. IMPRESSION: VASCULAR Variant arterial anatomy of the celiac axis as described above. Changes consistent with gastric varices intercommunicating between the splenic vein and left renal vein. The draining vein into the left renal vein measures approximately for 1.4 x 1.0 cm in diameter. Aortic Atherosclerosis (ICD10-I70.0). NON-VASCULAR Mild heterogeneity of the liver is noted with nodularity consistent with underlying cirrhosis. Diverticulosis without evidence of diverticulitis. Soft tissue density in the dependent portion of the stomach with air within. Given its proximity to the gastric varices along the posterior aspect of the gastric fundus this may represent thrombus. This area lies in an area of previous endoscopic clipping. No areas of active extravasation in the large or small bowel are noted. Electronically Signed   By: Inez Catalina M.D.   On: 11/22/2019 00:35    Labs:  CBC: Recent Labs    11/19/19 2123 11/20/19 0831 11/21/19 0457 11/21/19 1144 11/21/19 2200 11/22/19 0712  WBC 6.6 6.2 7.1 6.0  --   --   HGB 14.9 12.6* 9.5* 9.4* 7.4* 8.4*  HCT 43.4 37.8* 28.9* 28.5* 22.5* 24.6*  PLT 94* 77* 86* 84*  --   --     COAGS: Recent Labs    12/05/18 0930 06/03/19 1546 08/19/19 1304 11/19/19 2208  INR 1.1 1.2* 1.1 1.3*  APTT  --   --   --  41*    BMP: Recent Labs    07/27/19 1630 08/19/19 1304 11/19/19 2208 11/21/19 0457  NA 133* 139 141 137  K 3.7 4.3 4.0 4.3  CL 100 105 99 99  CO2 23 27 29 29   GLUCOSE 116* 114 133* 147*  BUN <5* 5* 11 14    CALCIUM 8.8* 9.0 9.4 7.4*  CREATININE 0.67 0.74 0.71  1.07  GFRNONAA >60  --  >60 >60  GFRAA >60  --  >60 >60    LIVER FUNCTION TESTS: Recent Labs    07/15/19 1222 07/27/19 1630 08/19/19 1304 11/19/19 2208  BILITOT 2.9* 3.3* 1.5* 8.8*  AST 148* 86* 61* 133*  ALT 36 34 26 39  ALKPHOS  --  199*  --  254*  PROT 7.2 7.4 6.8 8.3*  ALBUMIN  --  2.9*  --  3.1*    TUMOR MARKERS: Recent Labs    12/05/18 0930  AFPTM 6.7*    Assessment and Plan:  Coffee ground emesis-- post endo scope yesterday at [facility] Clipping of variceal lesion Continued bloody stools Tx to [facility] Worsening and active BP 70s; tachy 3 units so far MELD 18 per calculations  Candidate for Balloon retrograde transvenous obliteration and possible Transjugular intrahepatic portal system shunt placement Dr Earleen Newport has spoken to pt at length - pt agreeable to move ahead  Risks and benefits of TIPS, BRTO and/or additional variceal embolization were discussed with the patient and/or the patient's family including, but not limited to, infection, bleeding, damage to adjacent structures, worsening hepatic and/or cardiac function, worsening and/or the development of altered mental status/encephalopathy, non-target embolization and death.   This interventional procedure involves the use of X-rays and because of the nature of the planned procedure, it is possible that we will have prolonged use of X-ray fluoroscopy.  Potential radiation risks to you include (but are not limited to) the following: - A slightly elevated risk for cancer  several years later in life. This risk is typically less than 0.5% percent. This risk is low in comparison to the normal incidence of human cancer, which is 33% for women and 50% for men according to the [place]. - Radiation induced injury can include skin redness, resembling a rash, tissue breakdown / ulcers and hair loss (which can be temporary or permanent).   The likelihood  of either of these occurring depends on the difficulty of the procedure and whether you are sensitive to radiation due to previous procedures, disease, or genetic conditions.   IF your procedure requires a prolonged use of radiation, you will be notified and given written instructions for further action.  It is your responsibility to monitor the irradiated area for the 2 weeks following the procedure and to notify your physician if you are concerned that you have suffered a radiation induced injury.    All of the patient's questions were answered, patient is agreeable to proceed.  Consent signed and in chart.   Thank you for this interesting consult.  I greatly enjoyed meeting SRIHAN BRUTUS and look forward to participating in their care.  A copy of this report was sent to the requesting provider on this date.  Electronically Signed: Lavonia Drafts, PA-C 11/22/2019, 9:40 AM   I spent a total of 40 Minutes    in face to face in clinical consultation, greater than 50% of which was counseling/coordinating care for BRTO/TIPs

## 2019-11-22 NOTE — Consult Note (Signed)
Referring Provider: Dr. Ander Slade Primary Care Physician:  [name], MD Primary Gastroenterologist:  Dr. Gala Romney  Reason for Consultation:  GI bleed; Gastric varices  HPI: Bradley Miles is a 53 y.o. male with alcoholic cirrhosis transferred from [facility] for Balloon-occluded retrograde transvenous obliteration (BRTO) for bleeding gastric varices. S/P hemoclipping of an area distal to gastric varix on 11/20/19 by Dr. Gala Romney and transferred for BRTO that was done today. Has had multiple maroon-colored stools overnight prior to the BRTO and after returning from IR had a very large maroon-colored stool. No hematemesis since admit. He is on Neosynephrine. Hgb 6.5 (8.4). Reports actively drinking alcohol until this past Tuesday. Complaining of abdominal pain. Wife at bedside.  Past Medical History:  Diagnosis Date  . Anxiety   . Enlarged liver   . GERD (gastroesophageal reflux disease)   . Hypertension   . Palpitations   . PVC's (premature ventricular contractions)   . Shortness of breath   . Varicose veins     Past Surgical History:  Procedure Laterality Date  . BIOPSY  02/02/2019   Procedure: BIOPSY;  Surgeon: Daneil Dolin, MD;  Location: [facility];  Service: Endoscopy;;  gastric  . COLONOSCOPY WITH ESOPHAGOGASTRODUODENOSCOPY (EGD)  12/16/2012   internal hemorrhoids, colonic diverticulosis, benign polyps, screening in 2024. EGD with mild erosive reflux esophagitis, small hiatal hernia, negative H.pylori  . cyst reomved     from spine  . ESOPHAGOGASTRODUODENOSCOPY  05/19/09   RMR: Geographic distal esophageal erosions consistent with severe erosive reflux esophagitis. schatzki's ring s/P dilation/small hiatal hernia otherwise normal stomach  . ESOPHAGOGASTRODUODENOSCOPY (EGD) WITH PROPOFOL N/A 02/02/2019   Dr. Gala Romney: retained gastric contents. portal HTN gastropathy  . IR ANGIOGRAM SELECTIVE EACH ADDITIONAL VESSEL  11/22/2019  . IR EMBO ART  VEN HEMORR LYMPH EXTRAV  INC GUIDE  ROADMAPPING  11/22/2019  . IR US GUIDE VASC ACCESS RIGHT  11/22/2019  . IR VENOGRAM RENAL UNI LEFT  11/22/2019  . WISDOM TOOTH EXTRACTION      Prior to Admission medications   Medication Sig Start Date End Date Taking? Authorizing Provider  acebutolol (SECTRAL) 400 MG capsule Take 1 capsule (400 mg total) by mouth 2 (two) times daily. 10/13/19  Yes Herminio Commons, MD  allopurinol (ZYLOPRIM) 100 MG tablet TAKE 1 TABLET BY MOUTH EVERY DAY Patient taking differently: Take 100 mg by mouth.  07/13/19  Yes [name], MD  aspirin EC 81 MG tablet Take 81 mg by mouth daily.   Yes [provider]  cholecalciferol (VITAMIN D3) 25 MCG (1000 UT) tablet Take 1,000 Units by mouth daily.   Yes [provider]  DEXILANT 60 MG capsule TAKE 1 CAPSULE BY MOUTH EVERY DAY Patient taking differently: Take 60 mg by mouth daily.  06/18/19  Yes [name], MD  famotidine (PEPCID) 10 MG tablet Take 10 mg by mouth as needed for heartburn or indigestion.   Yes [provider]  folic acid (FOLVITE) 161 MCG tablet Take 400 mcg by mouth daily.   Yes [provider]  gabapentin (NEURONTIN) 300 MG capsule TAKE 1 CAPSULE BY MOUTH FOUR TIMES DAILY Patient taking differently: Take 800 mg by mouth at bedtime.  08/24/19  Yes [name], MD  lactulose, encephalopathy, (CHRONULAC) 10 GM/15ML SOLN Take 15-30cc three times daily to Titrate for 3-4 soft bowel movements a day Patient taking differently: Take 15-30cc three times daily to Titrate for 3-4 soft bowel movements a day (sometimes forgets mid day  dose) 08/04/19  Yes Mahala Menghini, PA-C  LORazepam (ATIVAN) 1 MG tablet Take 0.5 tablets (0.5 mg total) by mouth 2 (two) times daily as needed. for anxiety Patient taking differently: Take 1 mg by mouth 2 (two) times daily as needed for anxiety. for anxiety 07/29/19  Yes [name], MD  nortriptyline (PAMELOR) 50 MG capsule TAKE 1 CAPSULE(50 MG) BY MOUTH AT BEDTIME 03/23/19   Yes [name], MD  Omega-3 Fatty Acids (FISH OIL PO) Take 3 capsules by mouth as needed.    Yes [provider]  ondansetron (ZOFRAN-ODT) 4 MG disintegrating tablet DISSOLVE 1 TABLET ON THE TONGUE EVERY 8 HOURS AS NEEDED FOR NAUSEA 08/19/19  Yes Carlis Stable, NP  PARoxetine (PAXIL) 20 MG tablet TAKE 1 TABLET(20 MG) BY MOUTH DAILY 09/22/19  Yes [name], MD  sildenafil (VIAGRA) 100 MG tablet TAKE 1/2 TABLET BY MOUTH EVERY DAY 06/18/19  Yes [name], MD  tobramycin-dexamethasone [facility]) ophthalmic solution INSTILL 1 DROP INTO THE AFFECTED EYE TID FOR 5 DAYS 09/17/19  Yes [provider]  vitamin B-12 (CYANOCOBALAMIN) 1000 MCG tablet Take 1,000 mcg by mouth daily.   Yes [provider]    Scheduled Meds: . sodium chloride   Intravenous Once  . sodium chloride   Intravenous Once  . Chlorhexidine Gluconate Cloth  6 each Topical Daily  . fentaNYL      . folic acid  1 mg Oral Daily  . multivitamin with minerals  1 tablet Oral Daily  . [START ON 11/23/2019] pantoprazole  40 mg Intravenous Q12H  . PARoxetine  20 mg Oral Daily  . sucralfate  1 g Oral TID WC & HS  . thiamine  100 mg Oral Daily   Or  . thiamine  100 mg Intravenous Daily   Continuous Infusions: . sodium chloride Stopped (11/22/19 0921)  .  ceFAZolin (ANCEF) IV    . cefTRIAXone (ROCEPHIN)  IV Stopped (11/22/19 0839)  . magnesium sulfate bolus IVPB 2 g (11/22/19 1600)  . octreotide  (SANDOSTATIN)    IV infusion 50 mcg/hr (11/22/19 1532)  . pantoprozole (PROTONIX) infusion 8 mg/hr (11/22/19 1534)  . phenylephrine (NEO-SYNEPHRINE) Adult infusion 75 mcg/min (11/22/19 1100)  . sodium phosphate  Dextrose 5% IVPB 20 mmol (11/22/19 1601)   PRN Meds:.acetaminophen **OR** acetaminophen, LORazepam **OR** LORazepam, ondansetron **OR** ondansetron (ZOFRAN) IV  Allergies as of 11/19/2019  . (No Known Allergies)    Family History  Problem Relation Age of Onset  . Cancer Mother         ?etiology  . Heart disease Mother   . Colon polyps Sister 47       two sisters  . Liver disease Neg Hx     Social History   Socioeconomic History  . Marital status: Married    Spouse name: Not on file  . Number of children: 2  . Years of education: Not on file  . Highest education level: Not on file  Occupational History  . 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 Smoking status: Current Every Day Smoker    Packs/day: 1.50    Years: 30.00    Pack years: 45.00    Types: Cigarettes    Start date: 12/24/1979  . Smokeless tobacco: Never Used  Substance and Sexual Activity  . Alcohol use: Yes    Alcohol/week: 0.0 standard drinks    Comment: drinks 2 5th/daily  . Drug use: No  .  Sexual activity: Yes    Partners: Female    Birth control/protection: None  Other Topics Concern  . Not on file  Social History Narrative   Wife at bedside,    Social Determinants of Health   Financial Resource Strain:   . Difficulty of Paying Living Expenses: Not on file  Food Insecurity:   . Worried About Charity fundraiser in the Last Year: Not on file  . Ran Out of Food in the Last Year: Not on file  Transportation Needs:   . Lack of Transportation (Medical): Not on file  . Lack of Transportation (Non-Medical): Not on file  Physical Activity:   . Days of Exercise per Week: Not on file  . Minutes of Exercise per Session: Not on file  Stress:   . Feeling of Stress : Not on file  Social Connections:   . Frequency of Communication with Friends and Family: Not on file  . Frequency of Social Gatherings with Friends and Family: Not on file  . Attends Religious Services: Not on file  . Active Member of Clubs or Organizations: Not on file  . Attends Archivist Meetings: Not on file  . Marital Status: Not on file  Intimate Partner Violence:   . Fear of Current or Ex-Partner: Not on file  . Emotionally Abused: Not on file  . Physically Abused:  Not on file  . Sexually Abused: Not on file    Review of Systems: All negative except as stated above in HPI.  Physical Exam: Vital signs: Vitals:   11/22/19 1441 11/22/19 1600  BP:    Pulse: (!) 102 (!) 107  Resp: 14 12  Temp: 98.5 F (36.9 C) 98.1 F (36.7 C)  SpO2: 95% 99%  BP 117/60  Last BM Date: 11/22/19 General:   Lethargic, obese, mild acute distress Head: normocephalic, atraumatic Eyes: +icteric sclera ENT: oropharynx clear Neck: supple, nontender Lungs:  Clear throughout to auscultation.   No wheezes, crackles, or rhonchi. No acute distress. Heart:  Regular rate and rhythm; no murmurs, clicks, rubs,  or gallops. Abdomen: diffuse tenderness with guarding, +distention, decreased bowel sounds  Rectal:  Deferred Ext: + LE edema  GI:  Lab Results: Recent Labs    11/20/19 0831 11/21/19 0457 11/21/19 1144 11/21/19 2200 11/22/19 0712 11/22/19 1521  WBC 6.2 7.1 6.0  --   --   --   HGB 12.6* 9.5* 9.4* 7.4* 8.4* 6.5*  HCT 37.8* 28.9* 28.5* 22.5* 24.6* 18.6*  PLT 77* 86* 84*  --   --   --    BMET Recent Labs    11/19/19 2208 11/21/19 0457 11/22/19 0712  NA 141 137 139  K 4.0 4.3 3.8  CL 99 99 108  CO2 29 29 20*  GLUCOSE 133* 147* 119*  BUN 11 14 18   CREATININE 0.71 1.07 0.94  CALCIUM 9.4 7.4* 6.6*   LFT Recent Labs    11/22/19 0712  PROT 4.6*  ALBUMIN 1.7*  AST 71*  ALT 20  ALKPHOS 104  BILITOT 5.8*   PT/INR Recent Labs    11/19/19 2208  LABPROT 16.0*  INR 1.3*     Studies/Results:     CT Angio Abd/Pel w/ and/or w/o  Result Date: 11/22/2019 CLINICAL DATA:  GI bleed with large bloody bowel movement EXAM: CTA ABDOMEN AND PELVIS WITHOUT AND WITH CONTRAST TECHNIQUE: Multidetector CT imaging of the abdomen and pelvis was performed using the standard protocol during bolus administration of intravenous contrast. Multiplanar reconstructed  images and MIPs were obtained and reviewed to evaluate the vascular anatomy. CONTRAST:  100 mL  Omnipaque [id number]. COMPARISON:  06/02/2018 FINDINGS: VASCULAR Aorta: Abdominal aorta is well visualize without aneurysmal dilatation or focal dissection. Minimal atherosclerotic changes are noted. Celiac: Celiac axis is widely patent. The main celiac axis supplies the splenic artery as well as the right hepatic artery. Variant anatomy is noted with a smaller branch arising directly from the aorta just above the celiac axis supplying the left hepatic artery as well as left gastric artery. SMA: Patent without evidence of aneurysm, dissection, vasculitis or significant stenosis. Renals: Both renal arteries are patent without evidence of aneurysm, dissection, vasculitis, fibromuscular dysplasia or significant stenosis. IMA: Patent without evidence of aneurysm, dissection, vasculitis or significant stenosis. Iliacs: Atherosclerotic changes are noted without focal stenosis Veins: IVC appears within normal limits. Multiple gastric varices are identified near the gastroesophageal junction and along the posterior aspect of the gastric fundus. The largest of these varices measures at least 1.8 cm in greatest dimension. No significant ascending esophageal varices are noted. The gastric varices seen drain into the left renal vein. No other venous abnormality is noted. Portal vein is widely patent. Small coronary vein is seen. Review of the MIP images confirms the above findings. NON-VASCULAR Lower chest: No acute abnormality. Hepatobiliary: Liver is well visualized. Mild heterogeneity of the liver is seen without focal mass. Very mild nodularity is noted. The gallbladder is well distended with dependent gallstones. No complicating factors are seen. Mild perihepatic fluid is noted new from prior exam. Pancreas: Pancreas appears within normal limits. Spleen: Spleen shows no focal mass lesion. Splenic venous branches communicate with the gastric varices as well. Adrenals/Urinary Tract: The adrenal glands are within normal limits.  Kidneys are well visualized bilaterally. No renal calculi or obstructive changes are seen. Delayed images demonstrate a normal enhancement pattern as well. The bladder is partially distended. Stomach/Bowel: Colon is well visualized. Scattered diverticular change is seen without evidence of diverticulitis. The appendix appears within normal limits. Small bowel is unremarkable. No area to suggest active hemorrhage in the small bowel or colon is seen. Stomach is well distended with water administered right before the exam. Some swirling of air and density is noted within the dependent portion of the stomach. This could be related to thrombus given the known gastric varices. Endoscopic clip is noted adjacent to region of varices posteriorly. Lymphatic: No significant lymphadenopathy is noted. Reproductive: Prostate is unremarkable. Other: No abdominal wall hernia or abnormality. Minimal perihepatic fluid is noted as previously mentioned. Musculoskeletal: Bilateral pars defects are noted at L5 with only minimal anterolisthesis. No acute bony abnormality is noted. IMPRESSION: VASCULAR Variant arterial anatomy of the celiac axis as described above. Changes consistent with gastric varices intercommunicating between the splenic vein and left renal vein. The draining vein into the left renal vein measures approximately for 1.4 x 1.0 cm in diameter. Aortic Atherosclerosis (ICD10-I70.0). NON-VASCULAR Mild heterogeneity of the liver is noted with nodularity consistent with underlying cirrhosis. Diverticulosis without evidence of diverticulitis. Soft tissue density in the dependent portion of the stomach with air within. Given its proximity to the gastric varices along the posterior aspect of the gastric fundus this may represent thrombus. This area lies in an area of previous endoscopic clipping. No areas of active extravasation in the large or small bowel are noted. Electronically Signed   By: Inez Catalina M.D.   On: 11/22/2019  00:35    Impression/Plan: Decompensated alcoholic cirrhosis transferred from [facility] following  active bleeding with gastric varices and esophageal varices noted for BRTO, which was performed today. S/P hemoclipping of area near the gastric varices on 11/20/19 by Dr. Gala Romney. Large bloody BM following BRTO and multiple bloody stools overnight. Patient is on Neosynephrine. Continue Octreotide and Protonix drips. Pressors. Aggressive volume resuscitation with blood transfusions. I do not think a repeat EGD is needed at this time but may need a repeat EGD if bleeding persists to evaluate hemoclipping site. S/P BRTO so hopefully recent rectal bleeding episode was due to bleeding that had occurred pre-BRTO. Will follow closely. [facility] will f/u.    LOS: 2 days   Lear Ng  11/22/2019, 4:07 PM  Questions please call [phone]

## 2019-11-22 NOTE — Sedation Documentation (Signed)
RIGHT groin level 0, drsg CDI upon arrival to PACU.

## 2019-11-22 NOTE — Anesthesia Procedure Notes (Signed)
Arterial Line Insertion Start/End12/13/2020 11:25 AM, 11/22/2019 11:35 AM Performed by: Murvin Natal, MD, anesthesiologist  Patient location: OOR procedure area. Preanesthetic checklist: patient identified, IV checked, site marked, risks and benefits discussed, surgical consent, monitors and equipment checked, pre-op evaluation, timeout performed and anesthesia consent Lidocaine 1% used for infiltration and patient sedated Right, radial was placed Catheter size: 20 Fr Hand hygiene performed , maximum sterile barriers used  and Seldinger technique used  Attempts: 1 Procedure performed using ultrasound guided technique. Ultrasound Notes:anatomy identified, needle tip was noted to be adjacent to the nerve/plexus identified and no ultrasound evidence of intravascular and/or intraneural injection Following insertion, dressing applied and Biopatch. Post procedure assessment: normal and unchanged  Patient tolerated the procedure well with no immediate complications.

## 2019-11-22 NOTE — Procedures (Signed)
Interventional Radiology Procedure Note  Procedure:  US guided right CFV access for coil-assisted balloon-occlusion retrograde transvenous obliteration of gastric varices as a source of life-threatening upper GI bleeding.  Findings:  Lipiodol/sotradecol foam sclero of gasric variceal network, with the primary contribution discovered to be the posterior gastric veins from portal system.  Coil embo, with stasis at the renal vein shunt output on conclusion.   Complications: None  Recommendations:  - To PACU for recovery - local wound care at the right CFV puncture site - repeat CTA/BRTO protocol after 24-48 hours - continue H&H and current therapy - Follow up with VIR in clinic in 4-8 weeks with Dr. Earleen Newport - VIR to follow  Signed,  Dulcy Fanny. Earleen Newport, DO

## 2019-11-22 NOTE — Sedation Documentation (Signed)
12 Fr sheath removed from RIGHT femoral vein by Armando Reichert, RTR. Hemostasis achieved with manual pressure. Groin level 0, gauze/tegaderm bandage applied, CDI.

## 2019-11-22 NOTE — Anesthesia Procedure Notes (Signed)
Procedure Name: Intubation Date/Time: 11/22/2019 11:34 AM Performed by: Eligha Bridegroom, CRNA Pre-anesthesia Checklist: Patient identified, Emergency Drugs available, Suction available, Patient being monitored and Timeout performed Patient Re-evaluated:Patient Re-evaluated prior to induction Oxygen Delivery Method: Circle system utilized Preoxygenation: Pre-oxygenation with 100% oxygen Induction Type: IV induction and Rapid sequence Laryngoscope Size: Mac and 4 Grade View: Grade II Tube type: Oral Tube size: 7.5 mm Number of attempts: 1 Airway Equipment and Method: Stylet Secured at: 23 cm Tube secured with: Tape Dental Injury: Teeth and Oropharynx as per pre-operative assessment

## 2019-11-22 NOTE — Progress Notes (Signed)
Patient in the ICU for UGIB from decompensated Cirrhosis.  Discussed with PCCM, Dr Onnie Graham, who will take over care. TRH will resume care once out of the ICU.    Gerlean Ren MD [facility]

## 2019-11-22 NOTE — Progress Notes (Signed)
Paged CCM about patient arriving from PACU from IR procedure. GI paged and no call back. Will await orders. Bartholomew Crews, RN 11/22/2019 3:16 PM

## 2019-11-22 NOTE — Progress Notes (Signed)
RT called to assess Arterial line.  Cather is completely out. RT will not replace at this time. Per RN cuff pressures have been correlating with blood pressure cuff on arm.  If needed RN will let RT know.

## 2019-11-22 NOTE — Anesthesia Preprocedure Evaluation (Addendum)
Anesthesia Evaluation  Patient identified by MRN, date of birth, ID band Patient awake    Reviewed: Allergy & Precautions, NPO status , Patient's Chart, lab work & pertinent test results  Airway Mallampati: IV  TM Distance: >3 FB Neck ROM: Full    Dental  (+) Chipped   Pulmonary COPD, Current Smoker and Patient abstained from smoking.,    Pulmonary exam normal breath sounds clear to auscultation       Cardiovascular hypertension, Normal cardiovascular exam Rhythm:Regular Rate:Tachycardia  ECHO: Left ventricle: The cavity size was normal. Wall thickness was increased in a pattern of moderate LVH. Systolic function was normal. The estimated ejection fraction was in the range of 60% to 65%. Wall motion was normal; there were no regional wall motion abnormalities. The study is not technically sufficient to allow evaluation of LV diastolic function. Aortic valve: Valve area (VTI): 2.67 cm^2. Valve area (Vmax): 2.6 cm^2. Technically adequate study.   Neuro/Psych  Headaches, PSYCHIATRIC DISORDERS Anxiety  Neuromuscular disease    GI/Hepatic GERD  Medicated and Controlled,(+) Cirrhosis   Esophageal Varices  substance abuse  ,   Endo/Other  negative endocrine ROS  Renal/GU negative Renal ROS     Musculoskeletal negative musculoskeletal ROS (+)   Abdominal (+) + obese,   Peds  Hematology  (+) anemia , Gout Thrombocytopenia    Anesthesia Other Findings ESLD  Reproductive/Obstetrics                            Anesthesia Physical Anesthesia Plan  ASA: IV  Anesthesia Plan: General   Post-op Pain Management:    Induction: Intravenous  PONV Risk Score and Plan: 1 and Ondansetron, Dexamethasone and Treatment may vary due to age or medical condition  Airway Management Planned: Oral ETT  Additional Equipment: Arterial line, CVP and Ultrasound Guidance Line Placement  Intra-op Plan:    Post-operative Plan: Possible Post-op intubation/ventilation  Informed Consent: I have reviewed the patients History and Physical, chart, labs and discussed the procedure including the risks, benefits and alternatives for the proposed anesthesia with the patient or authorized representative who has indicated his/her understanding and acceptance.     Dental advisory given  Plan Discussed with: CRNA and Surgeon  Anesthesia Plan Comments: (Hypotensive in ICU)       Anesthesia Quick Evaluation

## 2019-11-22 NOTE — Progress Notes (Signed)
NAME:  Bradley Miles, MRN:  [id number], DOB:  1966/05/18, LOS: 2 ADMISSION DATE:  11/19/2019, CONSULTATION DATE: 11/21/2019 REFERRING MD: Dr. Tamela Gammon, CHIEF COMPLAINT: Esophageal varices  Brief History   53 y.o. M with PMH of ETOH abuse, COPD, cirrhosis and portal hypertension who presented to [facility] with coffee ground emesis two days ago.  He was admitted and seen by GI, underwent EGD which showed new varices and erosive gastritis.  He was treated with octreotide and PPI. Early this morning pt had large volume hematemesis with clots and was transferred to the ICU and then the decision was made to transfer to [facility] for IR consult.   History of present illness   Bradley Miles is a 53 y.o. M with PMH of ETOH abuse (1/5 per day), cirrhosis, HTN, and portal HTN who presented to the ED with coffee ground emesis and was found to have erosive gastritis and new oozing esophageal varices  S/p clipping x1.  Overnight 12/12, he developed large volume bright red hematemesis and was transferred to the ICU.  BP responded to volume resuscitation and given 1 unit PRBC's.  He has since had multiple episodes of maroon stool and so was transferred to [facility] ICU for CTA and IR consult.   Pt had two bloody stools on arrival, BP soft, but stable around 637 systolic.  Repeat Hgb down from 9.4 to 7.4. He is awake and oriented.  RN spoke with IR Dr. Earleen Newport who is aware of patient's arrival and bleeding.  C/o mild, diffuse abdominal pain and anxiety.   Past Medical History   has a past medical history of Anxiety, Enlarged liver, GERD (gastroesophageal reflux disease), Hypertension, Palpitations, PVC's (premature ventricular contractions), Shortness of breath, and Varicose veins.  Significant Hospital Events   12/11 Admit to hospitalists at [facility] 12/12 Hematemesis, txfr to [facility] ICU then to [facility]  Consults:  GI, IR, PCCM  Procedures:  1211 upper endoscopy  Significant Diagnostic Tests:  12/11 EGD>>severe  erosive gastritis, new varices  Micro Data:  12/11 Sars-Cov-2>>negative  Antimicrobials:  Erythromycin 12/11 only Ceftriaxone 12/11-  Interim history/subjective:  Patient arrived to [facility] ICU with continued bloody bowel motions stable hemodynamically  Objective   Blood pressure (!) 72/51, pulse (!) 112, temperature 98.4 F (36.9 C), temperature source Oral, resp. rate 12, height 6\' 1"  (1.854 m), weight 119 kg, SpO2 91 %.        Intake/Output Summary (Last 24 hours) at 11/22/2019 0940 Last data filed at 11/22/2019 0800 Gross per 24 hour  Intake 5178.51 ml  Output 1100 ml  Net 4078.51 ml   Filed Weights   11/19/19 2037 11/21/19 2000  Weight: 117.9 kg 119 kg    Examination: General: Awake and in no distress HENT: Moist mucosa, pink Lungs: Clear breath sounds bilaterally Cardiovascular: S1-S2 appreciated Abdomen: Bowel sounds appreciated Extremities: No edema, no clubbing Neuro: Alert and oriented x3 GU:   Resolved Hospital Problem list     Assessment & Plan:  Esophageal varices GI bleed -Possible BRTO today -Discussed with GI -Status post transfusion of 2 units -Continue serial H&H -Continue octreotide and Protonix -Patient n.p.o.  Cirrhosis, likely alcoholic -Total bili significantly elevated -Continue monitoring CMP -Continue CIWA, thiamine, folic acid -On ceftriaxone for SBP prophylaxis -Noncompliant with lactulose  Depression/anxiety -Paxil  Hypotension -Albumin -Neo-Synephrine   Best practice:  Diet: N.p.o. Pain/Anxiety/Delirium protocol (if indicated): Ativan VAP protocol (if indicated): Not applicable DVT prophylaxis: SCD GI prophylaxis: Protonix Glucose control: SSI Mobility: Bedrest Code Status:  Full code Family Communication: Discussed with patient Disposition: icu  Labs   CBC: Recent Labs  Lab 11/19/19 2123 11/20/19 0831 11/21/19 0457 11/21/19 1144 11/21/19 2200 11/22/19 0712  WBC 6.6 6.2 7.1 6.0  --   --   HGB 14.9 12.6*  9.5* 9.4* 7.4* 8.4*  HCT 43.4 37.8* 28.9* 28.5* 22.5* 24.6*  MCV 106.4* 108.6* 112.0* 111.3*  --   --   PLT 94* 77* 86* 84*  --   --     Basic Metabolic Panel: Recent Labs  Lab 11/19/19 2208 11/20/19 0456 11/21/19 0457  NA 141  --  137  K 4.0  --  4.3  CL 99  --  99  CO2 29  --  29  GLUCOSE 133*  --  147*  BUN 11  --  14  CREATININE 0.71  --  1.07  CALCIUM 9.4  --  7.4*  MG  --  1.2*  --   PHOS  --  4.2  --    GFR: Estimated Creatinine Clearance: 110.3 mL/min (by C-G formula based on SCr of 1.07 mg/dL). Recent Labs  Lab 11/19/19 2123 11/20/19 0831 11/21/19 0457 11/21/19 1144  WBC 6.6 6.2 7.1 6.0    Liver Function Tests: Recent Labs  Lab 11/19/19 2208  AST 133*  ALT 39  ALKPHOS 254*  BILITOT 8.8*  PROT 8.3*  ALBUMIN 3.1*   No results for input(s): LIPASE, AMYLASE in the last 168 hours. No results for input(s): AMMONIA in the last 168 hours.  ABG    Component Value Date/Time   TCO2 24 09/18/2013 0951     Coagulation Profile: Recent Labs  Lab 11/19/19 2208  INR 1.3*    Cardiac Enzymes: No results for input(s): CKTOTAL, CKMB, CKMBINDEX, TROPONINI in the last 168 hours.  HbA1C: Hgb A1c MFr Bld  Date/Time Value Ref Range Status  11/21/2019 04:57 AM 5.5 4.8 - 5.6 % Final    Comment:    (NOTE) Pre diabetes:          5.7%-6.4% Diabetes:              >6.4% Glycemic control for   <7.0% adults with diabetes   06/01/2018 07:45 AM 5.5 4.8 - 5.6 % Final    Comment:    (NOTE) Pre diabetes:          5.7%-6.4% Diabetes:              >6.4% Glycemic control for   <7.0% adults with diabetes     CBG: Recent Labs  Lab 11/21/19 2023 11/22/19 0040  GLUCAP 120* 114*    Review of Systems:   Abdominal discomfort  Past Medical History  He,  has a past medical history of Anxiety, Enlarged liver, GERD (gastroesophageal reflux disease), Hypertension, Palpitations, PVC's (premature ventricular contractions), Shortness of breath, and Varicose veins.    Surgical History    Past Surgical History:  Procedure Laterality Date  . BIOPSY  02/02/2019   Procedure: BIOPSY;  Surgeon: Daneil Dolin, MD;  Location: [facility] ENDO [facility];  Service: Endoscopy;;  gastric  . COLONOSCOPY WITH ESOPHAGOGASTRODUODENOSCOPY (EGD)  12/16/2012   internal hemorrhoids, colonic diverticulosis, benign polyps, screening in 2024. EGD with mild erosive reflux esophagitis, small hiatal hernia, negative H.pylori  . cyst reomved     from spine  . ESOPHAGOGASTRODUODENOSCOPY  05/19/09   RMR: Geographic distal esophageal erosions consistent with severe erosive reflux esophagitis. schatzki's ring s/P dilation/small hiatal hernia otherwise normal stomach  . ESOPHAGOGASTRODUODENOSCOPY (EGD) WITH  PROPOFOL N/A 02/02/2019   Dr. Gala Romney: retained gastric contents. portal HTN gastropathy  . WISDOM TOOTH EXTRACTION       Social History   reports that he has been smoking cigarettes. He started smoking about 39 years ago. He has a 45.00 pack-year smoking history. He has never used smokeless tobacco. He reports current alcohol use. He reports that he does not use drugs.   Family History   His family history includes Cancer in his mother; Colon polyps (age of onset: 31) in his sister; Heart disease in his mother. There is no history of Liver disease.   Allergies No Known Allergies   The patient is critically ill with multiple organ systems failure and requires high complexity decision making for assessment and support, frequent evaluation and titration of therapies, application of advanced monitoring technologies and extensive interpretation of multiple databases. Critical Care Time devoted to patient care services described in this note independent of APP/resident time (if applicable)  is 30 minutes.   Sherrilyn Rist MD [place] [facility] Critical [facility] Personal pager: [phone] If unanswered, please page CCM On-call: [phone]

## 2019-11-22 NOTE — Transfer of Care (Signed)
Immediate Anesthesia Transfer of Care Note  Patient: Bradley Miles  Procedure(s) Performed: BRTO/TRANS-JUGULAR INTRAHEPATIC PORTAL SHUNT (TIPS) (N/A )  Patient Location: PACU  Anesthesia Type:General  Level of Consciousness: awake, alert  and oriented  Airway & Oxygen Therapy: Patient Spontanous Breathing and Patient connected to nasal cannula oxygen  Post-op Assessment: Report given to RN and Post -op Vital signs reviewed and stable  Post vital signs: Reviewed and stable  Last Vitals:  Vitals Value Taken Time  BP    Temp    Pulse    Resp    SpO2      Last Pain:  Vitals:   11/22/19 0710  TempSrc: Oral  PainSc:          Complications: No apparent anesthesia complications

## 2019-11-23 DIAGNOSIS — R578 Other shock: Secondary | ICD-10-CM

## 2019-11-23 LAB — COMPREHENSIVE METABOLIC PANEL
ALT: 25 U/L (ref 0–44)
AST: 77 U/L — ABNORMAL HIGH (ref 15–41)
Albumin: 1.8 g/dL — ABNORMAL LOW (ref 3.5–5.0)
Alkaline Phosphatase: 85 U/L (ref 38–126)
Anion gap: 7 (ref 5–15)
BUN: 15 mg/dL (ref 6–20)
CO2: 25 mmol/L (ref 22–32)
Calcium: 6.5 mg/dL — ABNORMAL LOW (ref 8.9–10.3)
Chloride: 109 mmol/L (ref 98–111)
Creatinine, Ser: 0.9 mg/dL (ref 0.61–1.24)
GFR calc Af Amer: >60 mL/min (ref >60)
GFR calc non Af Amer: >60 mL/min (ref >60)
Glucose, Bld: 121 mg/dL — ABNORMAL HIGH (ref 70–99)
Potassium: 4 mmol/L (ref 3.5–5.1)
Sodium: 141 mmol/L (ref 135–145)
Total Bilirubin: 4 mg/dL — ABNORMAL HIGH (ref 0.3–1.2)
Total Protein: 4.2 g/dL — ABNORMAL LOW (ref 6.5–8.1)

## 2019-11-23 LAB — HEMOGLOBIN AND HEMATOCRIT, BLOOD
HCT: 20.6 % — ABNORMAL LOW (ref 39.0–52.0)
HCT: 23.3 % — ABNORMAL LOW (ref 39.0–52.0)
HCT: 23.5 % — ABNORMAL LOW (ref 39.0–52.0)
HCT: 19.1 % — ABNORMAL LOW (ref 39.0–52.0)
HCT: 19.1 % — ABNORMAL LOW (ref 39.0–52.0)
HCT: 20.8 % — ABNORMAL LOW (ref 39.0–52.0)
Hemoglobin: 7.1 g/dL — ABNORMAL LOW (ref 13.0–17.0)
Hemoglobin: 7.9 g/dL — ABNORMAL LOW (ref 13.0–17.0)
Hemoglobin: 8.1 g/dL — ABNORMAL LOW (ref 13.0–17.0)
Hemoglobin: 6.7 g/dL — CL (ref 13.0–17.0)
Hemoglobin: 6.6 g/dL — CL (ref 13.0–17.0)
Hemoglobin: 7 g/dL — ABNORMAL LOW (ref 13.0–17.0)

## 2019-11-23 LAB — CBC
HCT: 23.5 % — ABNORMAL LOW (ref 39.0–52.0)
Hemoglobin: 7.9 g/dL — ABNORMAL LOW (ref 13.0–17.0)
MCH: 33.9 pg (ref 26.0–34.0)
MCHC: 33.6 g/dL (ref 30.0–36.0)
MCV: 100.9 fL — ABNORMAL HIGH (ref 80.0–100.0)
Platelets: 77 10*3/uL — ABNORMAL LOW (ref 150–400)
RBC: 2.33 MIL/uL — ABNORMAL LOW (ref 4.22–5.81)
RDW: 21.1 % — ABNORMAL HIGH (ref 11.5–15.5)
WBC: 7.9 10*3/uL (ref 4.0–10.5)
nRBC: 0.8 % — ABNORMAL HIGH (ref 0.0–0.2)

## 2019-11-23 LAB — PREPARE RBC (CROSSMATCH)

## 2019-11-23 MED ORDER — LORAZEPAM 2 MG/ML IJ SOLN
1.0000 mg | INTRAMUSCULAR | Status: AC | PRN
  Administered 2019-11-23 – 2019-11-24 (×2): 2 mg via INTRAVENOUS
  Administered 2019-11-24 (×3): 1 mg via INTRAVENOUS
  Administered 2019-11-25: 2 mg via INTRAVENOUS
  Filled 2019-11-23 (×6): qty 1

## 2019-11-23 MED ORDER — SODIUM CHLORIDE 0.9% IV SOLUTION: Freq: Once | INTRAVENOUS | Status: DC

## 2019-11-23 MED ORDER — CALCIUM GLUCONATE-NACL 1-0.675 GM/50ML-% IV SOLN
1.0000 g | INTRAVENOUS | Status: AC
  Administered 2019-11-23: 11:00:00 1000 mg via INTRAVENOUS
  Filled 2019-11-23: qty 50

## 2019-11-23 MED ORDER — LORAZEPAM 1 MG PO TABS
1.0000 mg | ORAL_TABLET | ORAL | Status: AC | PRN
  Administered 2019-11-23: 1 mg via ORAL
  Administered 2019-11-25 – 2019-11-26 (×2): 2 mg via ORAL
  Filled 2019-11-23: qty 1
  Filled 2019-11-23 (×2): qty 2

## 2019-11-23 MED ORDER — MAGNESIUM SULFATE 2 GM/50ML IV SOLN
2.0000 g | Freq: Once | INTRAVENOUS | Status: AC
  Administered 2019-11-23: 09:00:00 2 g via INTRAVENOUS
  Filled 2019-11-23: qty 50

## 2019-11-23 MED ORDER — SODIUM CHLORIDE 0.9% IV SOLUTION
Freq: Once | INTRAVENOUS | Status: AC
  Administered 2019-11-23: 04:00:00 via INTRAVENOUS

## 2019-11-23 MED ORDER — SODIUM CHLORIDE 0.9% IV SOLUTION: Freq: Once | INTRAVENOUS | Status: AC

## 2019-11-23 NOTE — Progress Notes (Addendum)
Pt hypotensive, CCM paged

## 2019-11-23 NOTE — Care Plan (Signed)
-   Received call about one episode of small volume ( 30 to 50 cc) hematemesis.  - patient with known large Gastric Varices S/P BRTO.   - Recommend STAT H and H  - Transfuse one unit of PRBC - Continue Octreotide drip and IV BID PPI - Call me back with update.   Otis Brace MD, [place] 11/23/2019, 6:26 PM  Contact #  [phone]

## 2019-11-23 NOTE — Progress Notes (Signed)
[place] Progress Note Patient Name: AXEL FRISK DOB: 11-07-1966 MRN: [id number]   Date of Service  11/23/2019  HPI/Events of Note  Notified of H/H 7.1/20.6. Had 2 more bloody bowel movement since blood was drawn. SBPs 90s and HR 111  eICU Interventions  Transfuse 1 more unit PRBC to goal Hgb 8 esp since still tachycardic and relatively hypotensive     Intervention Category Major Interventions: Hemorrhage - evaluation and management  Shona Needles Alvis Edgell 11/23/2019, 3:34 AM

## 2019-11-23 NOTE — Progress Notes (Signed)
Pt vomited approximately 39mLs of bright red blood with a medium size clot. GI and CCM aware. Pt denies any pain. New orders placed

## 2019-11-23 NOTE — Progress Notes (Addendum)
NAME:  Bradley Miles, MRN:  [id number], DOB:  09/05/1966, LOS: 3 ADMISSION DATE:  11/19/2019, CONSULTATION DATE: 11/21/2019 REFERRING MD: Dr. Tamela Gammon, CHIEF COMPLAINT: Esophageal varices  Brief History   53 y.o. M with PMH of ETOH abuse, COPD, cirrhosis and portal hypertension who presented to [facility] with coffee ground emesis two days ago.  He was admitted and seen by GI, underwent EGD which showed new varices and erosive gastritis.  He was treated with octreotide and PPI. Early this morning pt had large volume hematemesis with clots and was transferred to the ICU and then the decision was made to transfer to [facility] for IR consult.   History of present illness   Bradley Miles is a 53 y.o. M with PMH of ETOH abuse (1/5 per day), cirrhosis, HTN, and portal HTN who presented to the ED with coffee ground emesis and was found to have erosive gastritis and new oozing esophageal varices  S/p clipping x1.  Overnight 12/12, he developed large volume bright red hematemesis and was transferred to the ICU.  BP responded to volume resuscitation and given 1 unit PRBC's.  He has since had multiple episodes of maroon stool and so was transferred to [facility] ICU for CTA and IR consult.   Pt had two bloody stools on arrival, BP soft, but stable around 737 systolic.  Repeat Hgb down from 9.4 to 7.4. He is awake and oriented.  RN spoke with IR Dr. Earleen Newport who is aware of patient's arrival and bleeding.  C/o mild, diffuse abdominal pain and anxiety.   Past Medical History   has a past medical history of Anxiety, Enlarged liver, GERD (gastroesophageal reflux disease), Hypertension, Palpitations, PVC's (premature ventricular contractions), Shortness of breath, and Varicose veins.  Significant Hospital Events   12/11 Admit to hospitalists at [facility] 12/12 Hematemesis, txfr to [facility] ICU then to [place]:  GI, IR, PCCM  Procedures:  12/11 upper endoscopy 12/13 BRTO  Significant Diagnostic Tests:  12/11  EGD>>severe erosive gastritis, new varices  Micro Data:  12/11 Sars-Cov-2>>negative  Antimicrobials:  Erythromycin 12/11 only Ceftriaxone 12/11-  Interim history/subjective:  1 PRBC overnight for Hgb 7.1, tachycardia No complaints this morning  Objective   Blood pressure (!) 103/59, pulse (!) 111, temperature 98.9 F (37.2 C), temperature source Oral, resp. rate 19, height 6\' 1"  (1.854 m), weight 119 kg, SpO2 97 %.        Intake/Output Summary (Last 24 hours) at 11/23/2019 0732 Last data filed at 11/23/2019 0600 Gross per 24 hour  Intake 4257.52 ml  Output 1225 ml  Net 3032.52 ml   Filed Weights   11/19/19 2037 11/21/19 2000  Weight: 117.9 kg 119 kg    Examination: General: Chronically ill appearing adult M NAD  HENT: NCAT, R IJ CVC, pink mmm.   Lungs: CTA bilaterally, Symmetrical chest expansion, shallow unlabored respirations  Cardiovascular: Sinus tachycardia. s1s2 no rgm Cap refill < 3 seconds  Abdomen: Protuberant, non-tender. + bowel sounds x4.  Extremities: Symmetrical bulk and tone, no cyanosis no clubbing  Neuro: AAOx4 following commands  Skin: pale, clean, dry, warm  GU: defer   Resolved Hospital Problem list     Assessment & Plan:   Esophageal varices GI bleed -s/p BRTO 12/13 -s/p additional 1 PRBC overnight  P -GI and IR involved  -Repeat CTA/BRTO protocol in 24-28 hrs per IR -Continue aggressive volume resusc.  -Repeat EGD not indicated at this time, GI will continue to follow  -Cont Octreotide, protonix  Acute Blood Loss Anemia -GIB P -q4 H/H -Hgb goal > 8  Hypotension, improving  -hypovolemic in setting of GIB P -Volume resuscitation w/ volume and blood product PRN  -Neo as needed to assist but favor volume resusc    Cirrhosis - 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 -Total bili significantly elevated Portal Hypertension  P -Trend CMP, coags  -Rocephin for SBP ppx  -dc APAP. If febrile, favor external cooling measures    EtOH abuse P -CIWA -Cont B vitamins   Depression/anxiety -Paxil  Hypomagnesemia Hypocalcemia -especially in setting of blood product admin P -Replete Mag and Calcium -Trend Mag, ionized calcium  Best practice:  Diet: Clean liquids  Pain/Anxiety/Delirium protocol (if indicated): Ativan VAP protocol (if indicated): Not applicable DVT prophylaxis: SCD GI prophylaxis: Protonix, octreotide  Glucose control: SSI Mobility: Bathroom privileges  Code Status: Full code Family Communication: discussed with patient, wife at bedside  Disposition: ICU at present, will monitor throughout day-- if remains HDS and off neo we will plan to transfer to SDU  Labs   CBC: Recent Labs  Lab 11/19/19 2123 11/20/19 0831 11/21/19 0457 11/21/19 1144 11/21/19 2200 11/22/19 0712 11/22/19 1521 11/22/19 2304 11/23/19 0247  WBC 6.6 6.2 7.1 6.0  --   --   --   --   --   HGB 14.9 12.6* 9.5* 9.4* 7.4* 8.4* 6.5* 7.5* 7.1*  HCT 43.4 37.8* 28.9* 28.5* 22.5* 24.6* 18.6* 22.7* 20.6*  MCV 106.4* 108.6* 112.0* 111.3*  --   --   --   --   --   PLT 94* 77* 86* 84*  --   --   --   --   --     Basic Metabolic Panel: Recent Labs  Lab 11/19/19 2208 11/20/19 0456 11/21/19 0457 11/22/19 0712  NA 141  --  137 139  K 4.0  --  4.3 3.8  CL 99  --  99 108  CO2 29  --  29 20*  GLUCOSE 133*  --  147* 119*  BUN 11  --  14 18  CREATININE 0.71  --  1.07 0.94  CALCIUM 9.4  --  7.4* 6.6*  MG  --  1.2*  --  0.8*  PHOS  --  4.2  --  2.5   GFR: Estimated Creatinine Clearance: 125.6 mL/min (by C-G formula based on SCr of 0.94 mg/dL). Recent Labs  Lab 11/19/19 2123 11/20/19 0831 11/21/19 0457 11/21/19 1144  WBC 6.6 6.2 7.1 6.0    Liver Function Tests: Recent Labs  Lab 11/19/19 2208 11/22/19 0712  AST 133* 71*  ALT 39 20  ALKPHOS 254* 104  BILITOT 8.8* 5.8*  PROT 8.3* 4.6*  ALBUMIN 3.1* 1.7*   No results for input(s): LIPASE, AMYLASE in the last 168 hours. No results for input(s): AMMONIA in  the last 168 hours.  ABG    Component Value Date/Time   TCO2 24 09/18/2013 0951     Coagulation Profile: Recent Labs  Lab 11/19/19 2208 11/22/19 2304  INR 1.3* 1.8*    Cardiac Enzymes: No results for input(s): CKTOTAL, CKMB, CKMBINDEX, TROPONINI in the last 168 hours.  HbA1C: Hgb A1c MFr Bld  Date/Time Value Ref Range Status  11/21/2019 04:57 AM 5.5 4.8 - 5.6 % Final    Comment:    (NOTE) Pre diabetes:          5.7%-6.4% Diabetes:              >6.4% Glycemic control for   <7.0% adults  with diabetes   06/01/2018 07:45 AM 5.5 4.8 - 5.6 % Final    Comment:    (NOTE) Pre diabetes:          5.7%-6.4% Diabetes:              >6.4% Glycemic control for   <7.0% adults with diabetes     CBG: Recent Labs  Lab 11/21/19 2023 11/22/19 0040  GLUCAP 120* 63*     Eliseo Gum MSN, AGACNP-BC [place] [phone] If no answer, [phone] 11/23/2019, 8:37 AM  Attending Note:  53 year old male with hemorrhagic shock from etoh induced varices who required pressors and blood product as well as embolization to control bleeding and ICu transfer for hemorrhagic shock.  Overnight, patient has been transfused and is much improved.  On exam, lungs are clear with no pain.  I reviewed CXR myself, no acute disease noted.  Discussed with PCCM-NP.  Will wean pressors to off.  Continue octreotide and protonix drips.  GI and IR following.  OOB to chair, PT/OT, clear liquid diet, monitor closely for withdrawal.  If improved by this afternoon will consider transfer out of the ICU and to [facility] service with PCCM off 12/15.  The patient is critically ill with multiple organ systems failure and requires high complexity decision making for assessment and support, frequent evaluation and titration of therapies, application of advanced monitoring technologies and extensive interpretation of multiple databases.   Critical Care Time devoted to patient care services  described in this note is  32  Minutes. This time reflects time of care of this signee Dr Jennet Maduro. This critical care time does not reflect procedure time, or teaching time or supervisory time of PA/NP/Med student/Med Resident etc but could involve care discussion time.  Rush Farmer, M.D. [facility] Pulmonary/Critical Care Medicine

## 2019-11-23 NOTE — Progress Notes (Signed)
Referring Physician(s): Dr. Clearence Ped  Supervising Physician: Sandi Mariscal  Patient Status:  [facility] - In-pt  Chief Complaint:  53 y.o. male. History of alcohol abuse, cirrohosis and portal hypertension presented to [facility] with coffee ground emesis. EDG showed gastritis and new esophageal varies with large volume hematemesis. Transferred to [facility]. IR performed a BRTO on 12.13.20 - 8 Fr Merci Balloon  Sclerosed  Stent  In posterior gastric vein and 95 cm with 24 by 60 cm coil, 28 x 60 cm coil, and 3 by 32 x 60 cm coil ruby coils   Subjective: Patient alert to person  and laying in recliner. Denies any fevers, headache, chest pain, SOB, cough, abdominal pain, nausea, vomiting or bleeding. Patient has transient     Allergies: Patient has no known allergies.  Medications: Prior to Admission medications   Medication Sig Start Date End Date Taking? Authorizing Provider  acebutolol (SECTRAL) 400 MG capsule Take 1 capsule (400 mg total) by mouth 2 (two) times daily. 10/13/19  Yes Herminio Commons, MD  allopurinol (ZYLOPRIM) 100 MG tablet TAKE 1 TABLET BY MOUTH EVERY DAY Patient taking differently: Take 100 mg by mouth.  07/13/19  Yes [name], MD  aspirin EC 81 MG tablet Take 81 mg by mouth daily.   Yes [provider]  cholecalciferol (VITAMIN D3) 25 MCG (1000 UT) tablet Take 1,000 Units by mouth daily.   Yes [provider]  DEXILANT 60 MG capsule TAKE 1 CAPSULE BY MOUTH EVERY DAY Patient taking differently: Take 60 mg by mouth daily.  06/18/19  Yes [name], MD  famotidine (PEPCID) 10 MG tablet Take 10 mg by mouth as needed for heartburn or indigestion.   Yes [provider]  folic acid (FOLVITE) 993 MCG tablet Take 400 mcg by mouth daily.   Yes [provider]  gabapentin (NEURONTIN) 300 MG capsule TAKE 1 CAPSULE BY MOUTH FOUR TIMES DAILY Patient taking differently: Take 800 mg by mouth at bedtime.  08/24/19  Yes [name], MD  lactulose, encephalopathy, (CHRONULAC) 10 GM/15ML SOLN Take 15-30cc three times daily to Titrate for 3-4 soft bowel movements a day Patient taking differently: Take 15-30cc three times daily to Titrate for 3-4 soft bowel movements a day (sometimes forgets mid day dose) 08/04/19  Yes Mahala Menghini, PA-C  LORazepam (ATIVAN) 1 MG tablet Take 0.5 tablets (0.5 mg total) by mouth 2 (two) times daily as needed. for anxiety Patient taking differently: Take 1 mg by mouth 2 (two) times daily as needed for anxiety. for anxiety 07/29/19  Yes [name], MD  nortriptyline (PAMELOR) 50 MG capsule TAKE 1 CAPSULE(50 MG) BY MOUTH AT BEDTIME 03/23/19  Yes [name], MD  Omega-3 Fatty Acids (FISH OIL PO) Take 3 capsules by mouth as needed.    Yes [provider]  ondansetron (ZOFRAN-ODT) 4 MG disintegrating tablet DISSOLVE 1 TABLET ON THE TONGUE EVERY 8 HOURS AS NEEDED FOR NAUSEA 08/19/19  Yes Carlis Stable, NP  PARoxetine (PAXIL) 20 MG tablet TAKE 1 TABLET(20 MG) BY MOUTH DAILY 09/22/19  Yes [name], MD  sildenafil (VIAGRA) 100 MG tablet TAKE 1/2 TABLET BY MOUTH EVERY DAY 06/18/19  Yes [name], MD  tobramycin-dexamethasone [facility]) ophthalmic solution INSTILL 1 DROP INTO THE AFFECTED EYE TID FOR 5 DAYS 09/17/19  Yes [provider]  vitamin B-12 (CYANOCOBALAMIN) 1000 MCG tablet Take 1,000 mcg by mouth daily.   Yes [provider]     Vital Signs: BP 123/71   Pulse (!) 106   Temp 98.3 F (36.8 C) (Oral)   Resp 15   Ht 6\' 1"  (1.854 m)   Wt 262 lb 5.6 oz (119 kg)   SpO2 93%   BMI 34.61 kg/m   Physical Exam Vitals and nursing note reviewed.  Constitutional:      General: He is not in acute distress.    Appearance: He is well-developed.  HENT:     Head: Normocephalic.  Cardiovascular:     Rate and Rhythm: Normal rate and regular rhythm.  Pulmonary:     Effort: Pulmonary effort is normal.  Musculoskeletal:        General: Normal  range of motion.     Cervical back: Normal range of motion.  Skin:    General: Skin is warm and dry.     Comments: Right femoral access site unremarkable with no pseudoaneurysm noted.. Dressing is clean dry and intact.  Neurological:     Mental Status: He is alert and oriented to person, place, and time.  Psychiatric:     Comments: Patient asking repeatative questions.      Imaging: DG Chest 1 View  Result Date: 11/22/2019 CLINICAL DATA:  Central line placement EXAM: CHEST  1 VIEW COMPARISON:  06/01/2018 FINDINGS: Right neck vascular catheter is positioned with tip near the superior cavoatrial junction. Cardiomegaly. Mild, diffuse interstitial pulmonary opacity. IMPRESSION: 1. Right neck vascular catheter with tip near the superior cavoatrial junction. 2. Cardiomegaly with mild diffuse interstitial pulmonary opacity, which may reflect edema or infection. Electronically Signed   By: Eddie Candle M.D.   On: 11/22/2019 14:59   IR Angiogram Selective Each Additional Vessel  Result Date: 11/22/2019 CLINICAL DATA:  53 year old male with alcoholic cirrhosis and upper at GI hemorrhage, transferred from [facility] with gastric variceal hemorrhage, status post endoscopic identification and clipping. He has continued to have GI blood loss with need for pressor support, and presents now for urgent treatment with BRTO and/or TIPS EXAM: ULTRASOUND GUIDED ACCESS RIGHT COMMON FEMORAL VEIN LEFT RENAL VEIN ANGIOGRAM RETROGRADE SCLEROTHERAPY OF GASTRIC VARICES, ASSISTED WITH COIL EMBOLIZATION MEDICATIONS: As antibiotic prophylaxis, 2 G ANCEF was ordered pre-procedure and administered intravenously within one hour of incision. One unit of platelets was also administered IV during the procedure. ANESTHESIA/SEDATION: General - as administered by the Anesthesia department CONTRAST:  90 cc IV contrast FLUOROSCOPY TIME:  Fluoroscopy Time: 19 minutes 42 seconds (12/29/2041 mGy). COMPLICATIONS: None PROCEDURE:  Informed written consent was obtained from the patient and the patient's family after a thorough discussion of the procedural risks, benefits and alternatives. Specific risks that were addressed regarding BRTO include, bleeding, infection, contrast reaction, kidney injury, need for further procedure or surgery including TIPS and/ or endoscopy, pulmonary embolism, stroke, worsening of ascites, worsening of esophageal varices, paradoxical liver failure, varix rupture, cardiopulmonary collapse, death. All questions were addressed. Maximal Sterile Barrier Technique was utilized including caps, mask, sterile gowns, sterile gloves, sterile drape, hand hygiene and skin antiseptic. A timeout was performed prior to the initiation of the procedure. Ultrasound survey of the right inguinal region was performed with images stored and sent to PACs. A single wall puncture needle was used access the right common femoral artery under ultrasound guidance with saline syringe. With venous blood flow returned, a Bentson wire was observed to enter the common femoral vein and the IVC under fluoroscopy. The needle was removed, and a small incision was made, and 9  [place] dilator was passed over the [place] wire. A 10 French TIPS sheath was then passed over the Bentson wire after manually creating a significant leftward current on the catheter. The inner dilator and wire were removed, and a 5 [place] cobra catheter was passed over the Bentson wire into the IVC. Cobra catheter was used to select the left renal vein orifice. Combination of the Bentson wire, Glidewire, Rosen wire were used to gain purchased into the renal vein, with renal vein limited venogram performed to identify the in flowing gastro renal shunt. With a safety curve on a stiff Amplatz wire position into superior pole renal vein, the TIPS sheath was advanced into the left renal vein over the Cobra catheter. Pullback reinforced straight sheath selection technique (PRESSS) was  performed, and as the tip sheath was withdrawn, contrast was used to identified the inflow of the gastro renal shunt. With the coaxial Amplatz wire in position, a combination of a standard Kumpe catheter and a 4 French angled glide catheter were used to cannulate the orifice of the gastro renal shunt flowing shunt opposite the adrenal vein inflow. Once the Glidewire was advanced into the shunt, the catheter was advanced, and the Glidewire was exchanged for a stiff Amplatz wire with a safety curve on the tip. The renal vein buddy wire was withdrawn, and the TIPS sheath was advanced into the gastro renal shunt. As the 10 [place] TIPS sheath was clearly not long enough at 35 cm, a 45 cm 12 French sheath was placed. This sheath was then advanced into the most inferior aspects of the shunt orifice into the renal vein, though would not advance further given the length. Once the glide catheter and the Glidewire were in a reasonable location within the shunt, the catheter was withdrawn and then a balloon occlusion catheter was advanced over the Amplatz wire into the gastro renal shunt. Amplatz wire was then withdrawn. Angiogram was then performed to confirmed location within the gastro renal shunt outflow. Initial balloon inflation did not achieve stasis of the shunt, and the balloon was deflated with repositioning needed. There was difficulty passing the [place] balloon catheter into a more distal position of the shunt, likely secondary to tortuosity and/or venous web. Exchange for an 8 [place] Merci balloon, 95 cm, was performed on an Amplatz wire. A more distal position within the shunt was achieved. Balloon on the [facility] was inflated on the balloon occlusion catheter and was withdrawn to the first encountered web to achieve hemostasis. Angiogram confirmed stasis of flow. A CO2 angiogram was also performed, identifying the posterior gastric veins as the stopping point. A penumbra Lantern microcatheter and a fathom  wire were navigated into the shunt. Once we confirmed location within the outflow with standard angiogram, treatment was initiated. A foamed solution of detergent/sclerosant was mixed in a standard Tessari method, with 1:2:3 ratio of ethiodized oil: sodium tetradecyl sulfate: Room air. The sclerosed sent was infused under fluoroscopic guidance, with observation of reflux into the afferent veins. Once reflux was identified into the posterior gastric vein, sclerosing was completed. This required only approximately 15 cc of foam sclerotherapy in the above ratio. Once the posterior gastric veins were identified, we stop treatment. The micro catheter was withdrawn to the tip of the balloon occlusion catheter, and coil mass was deposited with deployment of multiple Ruby coils. This was performed with 24 by 60 cm coil, 28 x 60 cm coil, and 3 by 32 x 60 cm coil. The balloon was deflated under  fluoroscopy, with no motion identified. The Merci balloon was slightly withdrawn into the more distal shunt (closer to the renal vein) and angiogram was performed confirming complete stasis of flow beyond the coil mass. Once we were confident there was no migration, balloon was withdrawn and the sheath was withdrawn. Hemostasis was achieved with manual compression. Patient remained hemodynamically stable throughout. No complications were encountered and no significant blood loss was encountered. IMPRESSION: Status post ultrasound guided access right common femoral vein for coil-assisted BRTO (sclerotherapy) as treatment for life-threatening gastric variceal hemorrhage. Signed, Dulcy Fanny. Dellia Nims, [place] [facility] Radiology Plan PLAN: Observe in PACU, then returned ICU Continue current management including serial H and H Local wound care management at the common femoral vein access site Repeat CT angiogram abdomen/pelvis after 24 hours-48 hours on this admission Follow-up vascular  Interventional Radiology Clinic in 4-8 weeks Electronically Signed   By: Corrie Mckusick D.O.   On: 11/22/2019 14:32   IR Venogram Renal Uni Left  Result Date: 11/22/2019 CLINICAL DATA:  53 year old male with alcoholic cirrhosis and upper at GI hemorrhage, transferred from [facility] with gastric variceal hemorrhage, status post endoscopic identification and clipping. He has continued to have GI blood loss with need for pressor support, and presents now for urgent treatment with BRTO and/or TIPS EXAM: ULTRASOUND GUIDED ACCESS RIGHT COMMON FEMORAL VEIN LEFT RENAL VEIN ANGIOGRAM RETROGRADE SCLEROTHERAPY OF GASTRIC VARICES, ASSISTED WITH COIL EMBOLIZATION MEDICATIONS: As antibiotic prophylaxis, 2 G ANCEF was ordered pre-procedure and administered intravenously within one hour of incision. One unit of platelets was also administered IV during the procedure. ANESTHESIA/SEDATION: General - as administered by the Anesthesia department CONTRAST:  90 cc IV contrast FLUOROSCOPY TIME:  Fluoroscopy Time: 19 minutes 42 seconds (12/29/2041 mGy). COMPLICATIONS: None PROCEDURE: Informed written consent was obtained from the patient and the patient's family after a thorough discussion of the procedural risks, benefits and alternatives. Specific risks that were addressed regarding BRTO include, bleeding, infection, contrast reaction, kidney injury, need for further procedure or surgery including TIPS and/ or endoscopy, pulmonary embolism, stroke, worsening of ascites, worsening of esophageal varices, paradoxical liver failure, varix rupture, cardiopulmonary collapse, death. All questions were addressed. Maximal Sterile Barrier Technique was utilized including caps, mask, sterile gowns, sterile gloves, sterile drape, hand hygiene and skin antiseptic. A timeout was performed prior to the initiation of the procedure. Ultrasound survey of the right inguinal region was performed with images stored and sent to PACs. A single  wall puncture needle was used access the right common femoral artery under ultrasound guidance with saline syringe. With venous blood flow returned, a Bentson wire was observed to enter the common femoral vein and the IVC under fluoroscopy. The needle was removed, and a small incision was made, and 9 [place] dilator was passed over the Bentson wire. A 10 French TIPS sheath was then passed over the Bentson wire after manually creating a significant leftward current on the catheter. The inner dilator and wire were removed, and a 5 [place] cobra catheter was passed over the Bentson wire into the IVC. Cobra catheter was used to select the left renal vein orifice. Combination of the Bentson wire, Glidewire, Rosen wire were used to gain purchased into the renal vein, with renal vein limited venogram performed to identify the in flowing gastro renal shunt. With a safety curve on a stiff Amplatz wire position into superior pole renal vein, the TIPS sheath was advanced into the left renal vein over the [employer]  catheter. Pullback reinforced straight sheath selection technique (PRESSS) was performed, and as the tip sheath was withdrawn, contrast was used to identified the inflow of the gastro renal shunt. With the coaxial Amplatz wire in position, a combination of a standard Kumpe catheter and a 4 French angled glide catheter were used to cannulate the orifice of the gastro renal shunt flowing shunt opposite the adrenal vein inflow. Once the Glidewire was advanced into the shunt, the catheter was advanced, and the Glidewire was exchanged for a stiff Amplatz wire with a safety curve on the tip. The renal vein buddy wire was withdrawn, and the TIPS sheath was advanced into the gastro renal shunt. As the 10 [place] TIPS sheath was clearly not long enough at 35 cm, a 45 cm 12 French sheath was placed. This sheath was then advanced into the most inferior aspects of the shunt orifice into the renal vein, though would not advance further  given the length. Once the glide catheter and the Glidewire were in a reasonable location within the shunt, the catheter was withdrawn and then a balloon occlusion catheter was advanced over the Amplatz wire into the gastro renal shunt. Amplatz wire was then withdrawn. Angiogram was then performed to confirmed location within the gastro renal shunt outflow. Initial balloon inflation did not achieve stasis of the shunt, and the balloon was deflated with repositioning needed. There was difficulty passing the [place] balloon catheter into a more distal position of the shunt, likely secondary to tortuosity and/or venous web. Exchange for an 8 [place] Merci balloon, 95 cm, was performed on an Amplatz wire. A more distal position within the shunt was achieved. Balloon on the [facility] was inflated on the balloon occlusion catheter and was withdrawn to the first encountered web to achieve hemostasis. Angiogram confirmed stasis of flow. A CO2 angiogram was also performed, identifying the posterior gastric veins as the stopping point. A penumbra Lantern microcatheter and a fathom wire were navigated into the shunt. Once we confirmed location within the outflow with standard angiogram, treatment was initiated. A foamed solution of detergent/sclerosant was mixed in a standard Tessari method, with 1:2:3 ratio of ethiodized oil: sodium tetradecyl sulfate: Room air. The sclerosed sent was infused under fluoroscopic guidance, with observation of reflux into the afferent veins. Once reflux was identified into the posterior gastric vein, sclerosing was completed. This required only approximately 15 cc of foam sclerotherapy in the above ratio. Once the posterior gastric veins were identified, we stop treatment. The micro catheter was withdrawn to the tip of the balloon occlusion catheter, and coil mass was deposited with deployment of multiple Ruby coils. This was performed with 24 by 60 cm coil, 28 x 60 cm coil, and 3 by 32 x  60 cm coil. The balloon was deflated under fluoroscopy, with no motion identified. The Merci balloon was slightly withdrawn into the more distal shunt (closer to the renal vein) and angiogram was performed confirming complete stasis of flow beyond the coil mass. Once we were confident there was no migration, balloon was withdrawn and the sheath was withdrawn. Hemostasis was achieved with manual compression. Patient remained hemodynamically stable throughout. No complications were encountered and no significant blood loss was encountered. IMPRESSION: Status post ultrasound guided access right common femoral vein for coil-assisted BRTO (sclerotherapy) as treatment for life-threatening gastric variceal hemorrhage. Signed, Dulcy Fanny. Dellia Nims, [place] [facility] Radiology Plan PLAN: Observe in PACU, then returned ICU Continue current management including serial H and H  Local wound care management at the common femoral vein access site Repeat CT angiogram abdomen/pelvis after 24 hours-48 hours on this admission Follow-up vascular Interventional Radiology Clinic in 4-8 weeks Electronically Signed   By: Corrie Mckusick D.O.   On: 11/22/2019 14:32   IR US Guide Vasc Access Right  Result Date: 11/22/2019 CLINICAL DATA:  52 year old male with alcoholic cirrhosis and upper at GI hemorrhage, transferred from [facility] with gastric variceal hemorrhage, status post endoscopic identification and clipping. He has continued to have GI blood loss with need for pressor support, and presents now for urgent treatment with BRTO and/or TIPS EXAM: ULTRASOUND GUIDED ACCESS RIGHT COMMON FEMORAL VEIN LEFT RENAL VEIN ANGIOGRAM RETROGRADE SCLEROTHERAPY OF GASTRIC VARICES, ASSISTED WITH COIL EMBOLIZATION MEDICATIONS: As antibiotic prophylaxis, 2 G ANCEF was ordered pre-procedure and administered intravenously within one hour of incision. One unit of platelets was also administered IV  during the procedure. ANESTHESIA/SEDATION: General - as administered by the Anesthesia department CONTRAST:  90 cc IV contrast FLUOROSCOPY TIME:  Fluoroscopy Time: 19 minutes 42 seconds (12/29/2041 mGy). COMPLICATIONS: None PROCEDURE: Informed written consent was obtained from the patient and the patient's family after a thorough discussion of the procedural risks, benefits and alternatives. Specific risks that were addressed regarding BRTO include, bleeding, infection, contrast reaction, kidney injury, need for further procedure or surgery including TIPS and/ or endoscopy, pulmonary embolism, stroke, worsening of ascites, worsening of esophageal varices, paradoxical liver failure, varix rupture, cardiopulmonary collapse, death. All questions were addressed. Maximal Sterile Barrier Technique was utilized including caps, mask, sterile gowns, sterile gloves, sterile drape, hand hygiene and skin antiseptic. A timeout was performed prior to the initiation of the procedure. Ultrasound survey of the right inguinal region was performed with images stored and sent to PACs. A single wall puncture needle was used access the right common femoral artery under ultrasound guidance with saline syringe. With venous blood flow returned, a Bentson wire was observed to enter the common femoral vein and the IVC under fluoroscopy. The needle was removed, and a small incision was made, and 9 [place] dilator was passed over the Bentson wire. A 10 French TIPS sheath was then passed over the Bentson wire after manually creating a significant leftward current on the catheter. The inner dilator and wire were removed, and a 5 [place] cobra catheter was passed over the Bentson wire into the IVC. Cobra catheter was used to select the left renal vein orifice. Combination of the Bentson wire, Glidewire, Rosen wire were used to gain purchased into the renal vein, with renal vein limited venogram performed to identify the in flowing gastro renal  shunt. With a safety curve on a stiff Amplatz wire position into superior pole renal vein, the TIPS sheath was advanced into the left renal vein over the Cobra catheter. Pullback reinforced straight sheath selection technique (PRESSS) was performed, and as the tip sheath was withdrawn, contrast was used to identified the inflow of the gastro renal shunt. With the coaxial Amplatz wire in position, a combination of a standard Kumpe catheter and a 4 French angled glide catheter were used to cannulate the orifice of the gastro renal shunt flowing shunt opposite the adrenal vein inflow. Once the Glidewire was advanced into the shunt, the catheter was advanced, and the Glidewire was exchanged for a stiff Amplatz wire with a safety curve on the tip. The renal vein buddy wire was withdrawn, and the TIPS sheath was advanced into the gastro renal shunt. As the 10 [place] TIPS sheath  was clearly not long enough at 35 cm, a 45 cm 12 French sheath was placed. This sheath was then advanced into the most inferior aspects of the shunt orifice into the renal vein, though would not advance further given the length. Once the glide catheter and the Glidewire were in a reasonable location within the shunt, the catheter was withdrawn and then a balloon occlusion catheter was advanced over the Amplatz wire into the gastro renal shunt. Amplatz wire was then withdrawn. Angiogram was then performed to confirmed location within the gastro renal shunt outflow. Initial balloon inflation did not achieve stasis of the shunt, and the balloon was deflated with repositioning needed. There was difficulty passing the [place] balloon catheter into a more distal position of the shunt, likely secondary to tortuosity and/or venous web. Exchange for an 8 [place] Merci balloon, 95 cm, was performed on an Amplatz wire. A more distal position within the shunt was achieved. Balloon on the [facility] was inflated on the balloon occlusion catheter and was  withdrawn to the first encountered web to achieve hemostasis. Angiogram confirmed stasis of flow. A CO2 angiogram was also performed, identifying the posterior gastric veins as the stopping point. A penumbra Lantern microcatheter and a fathom wire were navigated into the shunt. Once we confirmed location within the outflow with standard angiogram, treatment was initiated. A foamed solution of detergent/sclerosant was mixed in a standard Tessari method, with 1:2:3 ratio of ethiodized oil: sodium tetradecyl sulfate: Room air. The sclerosed sent was infused under fluoroscopic guidance, with observation of reflux into the afferent veins. Once reflux was identified into the posterior gastric vein, sclerosing was completed. This required only approximately 15 cc of foam sclerotherapy in the above ratio. Once the posterior gastric veins were identified, we stop treatment. The micro catheter was withdrawn to the tip of the balloon occlusion catheter, and coil mass was deposited with deployment of multiple Ruby coils. This was performed with 24 by 60 cm coil, 28 x 60 cm coil, and 3 by 32 x 60 cm coil. The balloon was deflated under fluoroscopy, with no motion identified. The Merci balloon was slightly withdrawn into the more distal shunt (closer to the renal vein) and angiogram was performed confirming complete stasis of flow beyond the coil mass. Once we were confident there was no migration, balloon was withdrawn and the sheath was withdrawn. Hemostasis was achieved with manual compression. Patient remained hemodynamically stable throughout. No complications were encountered and no significant blood loss was encountered. IMPRESSION: Status post ultrasound guided access right common femoral vein for coil-assisted BRTO (sclerotherapy) as treatment for life-threatening gastric variceal hemorrhage. Signed, Dulcy Fanny. Dellia Nims, [facility] Radiology Plan PLAN: Observe in  PACU, then returned ICU Continue current management including serial H and H Local wound care management at the common femoral vein access site Repeat CT angiogram abdomen/pelvis after 24 hours-48 hours on this admission Follow-up vascular Interventional Radiology Clinic in 4-8 weeks Electronically Signed   By: Corrie Mckusick D.O.   On: 11/22/2019 14:32   IR EMBO ART  VEN HEMORR LYMPH EXTRAV  INC GUIDE ROADMAPPING  Result Date: 11/22/2019 CLINICAL DATA:  53 year old male with alcoholic cirrhosis and upper at GI hemorrhage, transferred from [facility] with gastric variceal hemorrhage, status post endoscopic identification and clipping. He has continued to have GI blood loss with need for pressor support, and presents now for urgent treatment with BRTO and/or TIPS EXAM: [place]  LEFT RENAL VEIN ANGIOGRAM RETROGRADE SCLEROTHERAPY OF GASTRIC VARICES, ASSISTED WITH COIL EMBOLIZATION MEDICATIONS: As antibiotic prophylaxis, 2 G ANCEF was ordered pre-procedure and administered intravenously within one hour of incision. One unit of platelets was also administered IV during the procedure. ANESTHESIA/SEDATION: General - as administered by the Anesthesia department CONTRAST:  90 cc IV contrast FLUOROSCOPY TIME:  Fluoroscopy Time: 19 minutes 42 seconds (12/29/2041 mGy). COMPLICATIONS: None PROCEDURE: Informed written consent was obtained from the patient and the patient's family after a thorough discussion of the procedural risks, benefits and alternatives. Specific risks that were addressed regarding BRTO include, bleeding, infection, contrast reaction, kidney injury, need for further procedure or surgery including TIPS and/ or endoscopy, pulmonary embolism, stroke, worsening of ascites, worsening of esophageal varices, paradoxical liver failure, varix rupture, cardiopulmonary collapse, death. All questions were addressed. Maximal Sterile Barrier Technique was utilized  including caps, mask, sterile gowns, sterile gloves, sterile drape, hand hygiene and skin antiseptic. A timeout was performed prior to the initiation of the procedure. Ultrasound survey of the right inguinal region was performed with images stored and sent to PACs. A single wall puncture needle was used access the right common femoral artery under ultrasound guidance with saline syringe. With venous blood flow returned, a Bentson wire was observed to enter the common femoral vein and the IVC under fluoroscopy. The needle was removed, and a small incision was made, and 9 [place] dilator was passed over the Bentson wire. A 10 French TIPS sheath was then passed over the Bentson wire after manually creating a significant leftward current on the catheter. The inner dilator and wire were removed, and a 5 [place] cobra catheter was passed over the Bentson wire into the IVC. Cobra catheter was used to select the left renal vein orifice. Combination of the Bentson wire, Glidewire, Rosen wire were used to gain purchased into the renal vein, with renal vein limited venogram performed to identify the in flowing gastro renal shunt. With a safety curve on a stiff Amplatz wire position into superior pole renal vein, the TIPS sheath was advanced into the left renal vein over the Cobra catheter. Pullback reinforced straight sheath selection technique (PRESSS) was performed, and as the tip sheath was withdrawn, contrast was used to identified the inflow of the gastro renal shunt. With the coaxial Amplatz wire in position, a combination of a standard Kumpe catheter and a 4 French angled glide catheter were used to cannulate the orifice of the gastro renal shunt flowing shunt opposite the adrenal vein inflow. Once the Glidewire was advanced into the shunt, the catheter was advanced, and the Glidewire was exchanged for a stiff Amplatz wire with a safety curve on the tip. The renal vein buddy wire was withdrawn, and the TIPS sheath was  advanced into the gastro renal shunt. As the 10 [place] TIPS sheath was clearly not long enough at 35 cm, a 45 cm 12 French sheath was placed. This sheath was then advanced into the most inferior aspects of the shunt orifice into the renal vein, though would not advance further given the length. Once the glide catheter and the Glidewire were in a reasonable location within the shunt, the catheter was withdrawn and then a balloon occlusion catheter was advanced over the Amplatz wire into the gastro renal shunt. Amplatz wire was then withdrawn. Angiogram was then performed to confirmed location within the gastro renal shunt outflow. Initial balloon inflation did not achieve stasis of the shunt, and the balloon was deflated with repositioning needed. There  was difficulty passing the [place] balloon catheter into a more distal position of the shunt, likely secondary to tortuosity and/or venous web. Exchange for an 8 [place] Merci balloon, 95 cm, was performed on an Amplatz wire. A more distal position within the shunt was achieved. Balloon on the [facility] was inflated on the balloon occlusion catheter and was withdrawn to the first encountered web to achieve hemostasis. Angiogram confirmed stasis of flow. A CO2 angiogram was also performed, identifying the posterior gastric veins as the stopping point. A penumbra Lantern microcatheter and a fathom wire were navigated into the shunt. Once we confirmed location within the outflow with standard angiogram, treatment was initiated. A foamed solution of detergent/sclerosant was mixed in a standard Tessari method, with 1:2:3 ratio of ethiodized oil: sodium tetradecyl sulfate: Room air. The sclerosed sent was infused under fluoroscopic guidance, with observation of reflux into the afferent veins. Once reflux was identified into the posterior gastric vein, sclerosing was completed. This required only approximately 15 cc of foam sclerotherapy in the above ratio. Once the  posterior gastric veins were identified, we stop treatment. The micro catheter was withdrawn to the tip of the balloon occlusion catheter, and coil mass was deposited with deployment of multiple Ruby coils. This was performed with 24 by 60 cm coil, 28 x 60 cm coil, and 3 by 32 x 60 cm coil. The balloon was deflated under fluoroscopy, with no motion identified. The Merci balloon was slightly withdrawn into the more distal shunt (closer to the renal vein) and angiogram was performed confirming complete stasis of flow beyond the coil mass. Once we were confident there was no migration, balloon was withdrawn and the sheath was withdrawn. Hemostasis was achieved with manual compression. Patient remained hemodynamically stable throughout. No complications were encountered and no significant blood loss was encountered. IMPRESSION: Status post ultrasound guided access right common femoral vein for coil-assisted BRTO (sclerotherapy) as treatment for life-threatening gastric variceal hemorrhage. Signed, Dulcy Fanny. Dellia Nims, [place] [facility] Radiology Plan PLAN: Observe in PACU, then returned ICU Continue current management including serial H and H Local wound care management at the common femoral vein access site Repeat CT angiogram abdomen/pelvis after 24 hours-48 hours on this admission Follow-up vascular Interventional Radiology Clinic in 4-8 weeks Electronically Signed   By: Corrie Mckusick D.O.   On: 11/22/2019 14:32   CT Angio Abd/Pel w/ and/or w/o  Result Date: 11/22/2019 CLINICAL DATA:  GI bleed with large bloody bowel movement EXAM: CTA ABDOMEN AND PELVIS WITHOUT AND WITH CONTRAST TECHNIQUE: Multidetector CT imaging of the abdomen and pelvis was performed using the standard protocol during bolus administration of intravenous contrast. Multiplanar reconstructed images and MIPs were obtained and reviewed to evaluate the vascular anatomy. CONTRAST:  100 mL Omnipaque  350. COMPARISON:  06/02/2018 FINDINGS: VASCULAR Aorta: Abdominal aorta is well visualize without aneurysmal dilatation or focal dissection. Minimal atherosclerotic changes are noted. Celiac: Celiac axis is widely patent. The main celiac axis supplies the splenic artery as well as the right hepatic artery. Variant anatomy is noted with a smaller branch arising directly from the aorta just above the celiac axis supplying the left hepatic artery as well as left gastric artery. SMA: Patent without evidence of aneurysm, dissection, vasculitis or significant stenosis. Renals: Both renal arteries are patent without evidence of aneurysm, dissection, vasculitis, fibromuscular dysplasia or significant stenosis. IMA: Patent without evidence of aneurysm, dissection, vasculitis or significant stenosis. Iliacs: Atherosclerotic changes are noted without focal stenosis Veins:  IVC appears within normal limits. Multiple gastric varices are identified near the gastroesophageal junction and along the posterior aspect of the gastric fundus. The largest of these varices measures at least 1.8 cm in greatest dimension. No significant ascending esophageal varices are noted. The gastric varices seen drain into the left renal vein. No other venous abnormality is noted. Portal vein is widely patent. Small coronary vein is seen. Review of the MIP images confirms the above findings. NON-VASCULAR Lower chest: No acute abnormality. Hepatobiliary: Liver is well visualized. Mild heterogeneity of the liver is seen without focal mass. Very mild nodularity is noted. The gallbladder is well distended with dependent gallstones. No complicating factors are seen. Mild perihepatic fluid is noted new from prior exam. Pancreas: Pancreas appears within normal limits. Spleen: Spleen shows no focal mass lesion. Splenic venous branches communicate with the gastric varices as well. Adrenals/Urinary Tract: The adrenal glands are within normal limits. Kidneys are  well visualized bilaterally. No renal calculi or obstructive changes are seen. Delayed images demonstrate a normal enhancement pattern as well. The bladder is partially distended. Stomach/Bowel: Colon is well visualized. Scattered diverticular change is seen without evidence of diverticulitis. The appendix appears within normal limits. Small bowel is unremarkable. No area to suggest active hemorrhage in the small bowel or colon is seen. Stomach is well distended with water administered right before the exam. Some swirling of air and density is noted within the dependent portion of the stomach. This could be related to thrombus given the known gastric varices. Endoscopic clip is noted adjacent to region of varices posteriorly. Lymphatic: No significant lymphadenopathy is noted. Reproductive: Prostate is unremarkable. Other: No abdominal wall hernia or abnormality. Minimal perihepatic fluid is noted as previously mentioned. Musculoskeletal: Bilateral pars defects are noted at L5 with only minimal anterolisthesis. No acute bony abnormality is noted. IMPRESSION: VASCULAR Variant arterial anatomy of the celiac axis as described above. Changes consistent with gastric varices intercommunicating between the splenic vein and left renal vein. The draining vein into the left renal vein measures approximately for 1.4 x 1.0 cm in diameter. Aortic Atherosclerosis (ICD10-I70.0). NON-VASCULAR Mild heterogeneity of the liver is noted with nodularity consistent with underlying cirrhosis. Diverticulosis without evidence of diverticulitis. Soft tissue density in the dependent portion of the stomach with air within. Given its proximity to the gastric varices along the posterior aspect of the gastric fundus this may represent thrombus. This area lies in an area of previous endoscopic clipping. No areas of active extravasation in the large or small bowel are noted. Electronically Signed   By: Inez Catalina M.D.   On: 11/22/2019 00:35     Labs:  CBC: Recent Labs    11/20/19 0831 11/21/19 0457 11/21/19 1144 11/22/19 1521 11/22/19 2304 11/23/19 0247 11/23/19 0829  WBC 6.2 7.1 6.0  --   --   --  7.9  HGB 12.6* 9.5* 9.4* 6.5* 7.5* 7.1* 7.9*  HCT 37.8* 28.9* 28.5* 18.6* 22.7* 20.6* 23.5*  PLT 77* 86* 84*  --   --   --  77*    COAGS: Recent Labs    06/03/19 1546 08/19/19 1304 11/19/19 2208 11/22/19 2304  INR 1.2* 1.1 1.3* 1.8*  APTT  --   --  41*  --     BMP: Recent Labs    11/19/19 2208 11/21/19 0457 11/22/19 0712 11/23/19 0829  NA 141 137 139 141  K 4.0 4.3 3.8 4.0  CL 99 99 108 109  CO2 29 29 20* 25  GLUCOSE  133* 147* 119* 121*  BUN 11 14 18 15   CALCIUM 9.4 7.4* 6.6* 6.5*  CREATININE 0.71 1.07 0.94 0.90  GFRNONAA >60 >60 >60 >60  GFRAA >60 >60 >60 >60    LIVER FUNCTION TESTS: Recent Labs    07/27/19 1630 08/19/19 1304 11/19/19 2208 11/22/19 0712 11/23/19 0829  BILITOT 3.3* 1.5* 8.8* 5.8* 4.0*  AST 86* 61* 133* 71* 77*  ALT 34 26 39 20 25  ALKPHOS 199*  --  254* 104 85  PROT 7.4 6.8 8.3* 4.6* 4.2*  ALBUMIN 2.9*  --  3.1* 1.7* 1.8*    Assessment and Plan: 53 y.o. male. History of alcohol abuse, cirrohosis and portal hypertension presented to [facility] with coffee ground emesis. EDG showed gastritis and new esophageal varies. Patient continued with large volume hematemesis and was transferred to [facility]. IR performed a BRTO on 12.13.20 - 8 Fr Merci Balloon with sclerosed  stent  in posterior gastric vein and 95 cm with 24 by 60 cm coil, 28 x 60 cm coil, and 3 by 32 x 60 cm coil ruby coils   Access site unremarkable. Patient asking repetitive questions. Bedside RN reports giving Ativan earlier in the day.  Hgb 7.9 (up-trending) no recent ammonia level. No imaging since procedure.   Per procedure note patient will be seen in [facility] in 4/-8 weeks for follow- up. If patient's state of confusion persists please notify IR for further recommendations,    Electronically  Signed: Avel Peace, NP 11/23/2019, 12:09 PM   I spent a total of 15 Minutes at the the patient's bedside AND on the patient's hospital floor or unit, greater than 50% of which was counseling/coordinating care for post BRTO procedure.    Patient ID: Bradley Miles, male   DOB: 25-Jul-1966, 53 y.o.   MRN: [id number]

## 2019-11-23 NOTE — Progress Notes (Signed)
Clinically stable since BRTO.  He has had several additional bloody bowel movements, burgundy in color, most recently earlier this morning, but his hemoglobin came up appropriately following transfusion of 1 unit of packed red cells today, and has remained stable thereafter.  The patient is off pressors.  At present, he is asleep in the bedside chair and did not awaken to voice, so I did not awaken him.  His vital signs are satisfactory.  Impression: It appears that the blood he has been passing most recently reflects delayed passage of blood in the GI tract, rather than ongoing bleeding.  Recommendations: Continue observation on Protonix and octreotide.  Cleotis Nipper, M.D. Pager [phone] If no answer or after 5 PM call [phone]

## 2019-11-24 ENCOUNTER — Inpatient Hospital Stay (HOSPITAL_COMMUNITY): Payer: Managed Care, Other (non HMO) | Admitting: Registered Nurse

## 2019-11-24 ENCOUNTER — Encounter (HOSPITAL_COMMUNITY)
Admission: EM | Disposition: A | Discharge status: Home / Self Care | Payer: Self-pay | Source: Home / Self Care | Attending: Pulmonary Disease

## 2019-11-24 ENCOUNTER — Inpatient Hospital Stay (HOSPITAL_COMMUNITY): Payer: Managed Care, Other (non HMO) | Admitting: Anesthesiology

## 2019-11-24 ENCOUNTER — Inpatient Hospital Stay (HOSPITAL_COMMUNITY): Payer: Managed Care, Other (non HMO)

## 2019-11-24 ENCOUNTER — Encounter (HOSPITAL_COMMUNITY): Payer: Self-pay | Admitting: Family Medicine

## 2019-11-24 HISTORY — PX: IR EMBO ART  VEN HEMORR LYMPH EXTRAV  INC GUIDE ROADMAPPING: IMG5450

## 2019-11-24 HISTORY — PX: IR ANGIOGRAM SELECTIVE EACH ADDITIONAL VESSEL: IMG667

## 2019-11-24 HISTORY — PX: RADIOLOGY WITH ANESTHESIA: SHX6223

## 2019-11-24 HISTORY — PX: IR TIPS: IMG2295

## 2019-11-24 HISTORY — PX: ESOPHAGOGASTRODUODENOSCOPY (EGD) WITH PROPOFOL: SHX5813

## 2019-11-24 LAB — CBC WITH DIFFERENTIAL/PLATELET
Abs Immature Granulocytes: 0.37 10*3/uL — ABNORMAL HIGH (ref 0.00–0.07)
Abs Immature Granulocytes: 0.38 K/uL — ABNORMAL HIGH (ref 0.00–0.07)
Basophils Absolute: 0.1 10*3/uL (ref 0.0–0.1)
Basophils Absolute: 0.1 K/uL (ref 0.0–0.1)
Basophils Relative: 1 %
Basophils Relative: 1 %
Eosinophils Absolute: 0.3 10*3/uL (ref 0.0–0.5)
Eosinophils Absolute: 0.3 K/uL (ref 0.0–0.5)
Eosinophils Relative: 2 %
Eosinophils Relative: 2 %
HCT: 19 % — ABNORMAL LOW (ref 39.0–52.0)
HCT: 18.5 % — ABNORMAL LOW (ref 39.0–52.0)
Hemoglobin: 6.3 g/dL — CL (ref 13.0–17.0)
Hemoglobin: 6.3 g/dL — CL (ref 13.0–17.0)
Immature Granulocytes: 3 %
Immature Granulocytes: 3 %
Lymphocytes Relative: 10 %
Lymphocytes Relative: 9 %
Lymphs Abs: 1.3 10*3/uL (ref 0.7–4.0)
Lymphs Abs: 1.3 K/uL (ref 0.7–4.0)
MCH: 32.5 pg (ref 26.0–34.0)
MCH: 33.2 pg (ref 26.0–34.0)
MCHC: 33.2 g/dL (ref 30.0–36.0)
MCHC: 34.1 g/dL (ref 30.0–36.0)
MCV: 97.9 fL (ref 80.0–100.0)
MCV: 97.4 fL (ref 80.0–100.0)
Monocytes Absolute: 2.5 10*3/uL — ABNORMAL HIGH (ref 0.1–1.0)
Monocytes Absolute: 2.7 K/uL — ABNORMAL HIGH (ref 0.1–1.0)
Monocytes Relative: 18 %
Monocytes Relative: 18 %
Neutro Abs: 9.3 10*3/uL — ABNORMAL HIGH (ref 1.7–7.7)
Neutro Abs: 9.7 K/uL — ABNORMAL HIGH (ref 1.7–7.7)
Neutrophils Relative %: 66 %
Neutrophils Relative %: 67 %
Platelets: 113 10*3/uL — ABNORMAL LOW (ref 150–400)
Platelets: 117 K/uL — ABNORMAL LOW (ref 150–400)
RBC: 1.94 MIL/uL — ABNORMAL LOW (ref 4.22–5.81)
RBC: 1.9 MIL/uL — ABNORMAL LOW (ref 4.22–5.81)
RDW: 22.3 % — ABNORMAL HIGH (ref 11.5–15.5)
RDW: 22.4 % — ABNORMAL HIGH (ref 11.5–15.5)
WBC: 13.9 10*3/uL — ABNORMAL HIGH (ref 4.0–10.5)
WBC: 14.4 K/uL — ABNORMAL HIGH (ref 4.0–10.5)
nRBC: 2.1 % — ABNORMAL HIGH (ref 0.0–0.2)
nRBC: 2.2 % — ABNORMAL HIGH (ref 0.0–0.2)

## 2019-11-24 LAB — BASIC METABOLIC PANEL
Anion gap: 5 (ref 5–15)
BUN: 15 mg/dL (ref 6–20)
CO2: 25 mmol/L (ref 22–32)
Calcium: 6.6 mg/dL — ABNORMAL LOW (ref 8.9–10.3)
Chloride: 110 mmol/L (ref 98–111)
Creatinine, Ser: 1.12 mg/dL (ref 0.61–1.24)
GFR calc Af Amer: >60 mL/min (ref >60)
GFR calc non Af Amer: >60 mL/min (ref >60)
Glucose, Bld: 156 mg/dL — ABNORMAL HIGH (ref 70–99)
Potassium: 4.2 mmol/L (ref 3.5–5.1)
Sodium: 140 mmol/L (ref 135–145)

## 2019-11-24 LAB — HEMOGLOBIN AND HEMATOCRIT, BLOOD
HCT: 20.2 % — ABNORMAL LOW (ref 39.0–52.0)
HCT: 20.2 % — ABNORMAL LOW (ref 39.0–52.0)
Hemoglobin: 6.9 g/dL — CL (ref 13.0–17.0)
Hemoglobin: 6.8 g/dL — CL (ref 13.0–17.0)

## 2019-11-24 LAB — PREPARE RBC (CROSSMATCH)

## 2019-11-24 SURGERY — IR WITH ANESTHESIA
Anesthesia: General

## 2019-11-24 SURGERY — ESOPHAGOGASTRODUODENOSCOPY (EGD) WITH PROPOFOL
Anesthesia: Monitor Anesthesia Care

## 2019-11-24 MED ORDER — IOHEXOL 300 MG/ML  SOLN
100.0000 mL | Freq: Once | INTRAMUSCULAR | Status: AC | PRN
  Administered 2019-11-24: 70 mL via INTRA_ARTERIAL

## 2019-11-24 MED ORDER — WHITE PETROLATUM EX OINT
TOPICAL_OINTMENT | CUTANEOUS | Status: AC
  Filled 2019-11-24: qty 28.35

## 2019-11-24 MED ORDER — PROPOFOL 10 MG/ML IV BOLUS
INTRAVENOUS | Status: DC | PRN
  Administered 2019-11-24: 200 mg via INTRAVENOUS
  Administered 2019-11-24 (×3): 20 mg via INTRAVENOUS

## 2019-11-24 MED ORDER — FENTANYL CITRATE (PF) 100 MCG/2ML IJ SOLN
INTRAMUSCULAR | Status: AC
  Filled 2019-11-24: qty 2

## 2019-11-24 MED ORDER — SODIUM CHLORIDE 0.9% IV SOLUTION
Freq: Once | INTRAVENOUS | Status: AC
  Administered 2019-11-24: 10 mL/h via INTRAVENOUS

## 2019-11-24 MED ORDER — PROPOFOL 10 MG/ML IV BOLUS
INTRAVENOUS | Status: DC | PRN
  Administered 2019-11-24 (×2): 30 mg via INTRAVENOUS

## 2019-11-24 MED ORDER — DEXMEDETOMIDINE HCL 200 MCG/2ML IV SOLN
INTRAVENOUS | Status: DC | PRN
  Administered 2019-11-24: 20 ug via INTRAVENOUS

## 2019-11-24 MED ORDER — PHENYLEPHRINE HCL-NACL 10-0.9 MG/250ML-% IV SOLN
INTRAVENOUS | Status: DC | PRN
  Administered 2019-11-24: 20 ug/min via INTRAVENOUS

## 2019-11-24 MED ORDER — PHENYLEPHRINE 40 MCG/ML (10ML) SYRINGE FOR IV PUSH (FOR BLOOD PRESSURE SUPPORT)
PREFILLED_SYRINGE | INTRAVENOUS | Status: DC | PRN
  Administered 2019-11-24 (×4): 120 ug via INTRAVENOUS

## 2019-11-24 MED ORDER — FENTANYL CITRATE (PF) 250 MCG/5ML IJ SOLN
INTRAMUSCULAR | Status: DC | PRN
  Administered 2019-11-24 (×3): 50 ug via INTRAVENOUS
  Administered 2019-11-24: 100 ug via INTRAVENOUS
  Administered 2019-11-24: 50 ug via INTRAVENOUS

## 2019-11-24 MED ORDER — SODIUM CHLORIDE 0.9% IV SOLUTION: Freq: Once | INTRAVENOUS | Status: AC

## 2019-11-24 MED ORDER — MIDAZOLAM HCL 2 MG/2ML IJ SOLN
INTRAMUSCULAR | Status: AC
  Filled 2019-11-24: qty 2

## 2019-11-24 MED ORDER — VASOPRESSIN 20 UNIT/ML IV SOLN
INTRAVENOUS | Status: AC
  Filled 2019-11-24: qty 1

## 2019-11-24 MED ORDER — IOHEXOL 300 MG/ML  SOLN: 100.0000 mL | Freq: Once | INTRAMUSCULAR | Status: DC | PRN

## 2019-11-24 MED ORDER — SUCCINYLCHOLINE CHLORIDE 200 MG/10ML IV SOSY
PREFILLED_SYRINGE | INTRAVENOUS | Status: DC | PRN
  Administered 2019-11-24: 140 mg via INTRAVENOUS

## 2019-11-24 MED ORDER — LIDOCAINE 2% (20 MG/ML) 5 ML SYRINGE
INTRAMUSCULAR | Status: DC | PRN
  Administered 2019-11-24: 80 mg via INTRAVENOUS

## 2019-11-24 MED ORDER — IOHEXOL 350 MG/ML SOLN
100.0000 mL | Freq: Once | INTRAVENOUS | Status: AC | PRN
  Administered 2019-11-24: 100 mL via INTRAVENOUS

## 2019-11-24 MED ORDER — SODIUM CHLORIDE 0.9 % IV SOLN: INTRAVENOUS | Status: DC

## 2019-11-24 MED ORDER — PROPOFOL 500 MG/50ML IV EMUL
INTRAVENOUS | Status: DC | PRN
  Administered 2019-11-24: 100 ug/kg/min via INTRAVENOUS

## 2019-11-24 MED ORDER — LACTATED RINGERS IV SOLN: INTRAVENOUS | Status: DC

## 2019-11-24 MED ORDER — ONDANSETRON HCL 4 MG/2ML IJ SOLN
INTRAMUSCULAR | Status: DC | PRN
  Administered 2019-11-24: 4 mg via INTRAVENOUS

## 2019-11-24 MED ORDER — LIDOCAINE 2% (20 MG/ML) 5 ML SYRINGE
INTRAMUSCULAR | Status: DC | PRN
  Administered 2019-11-24: 100 mg via INTRAVENOUS

## 2019-11-24 MED ORDER — FOLIC ACID 5 MG/ML IJ SOLN
1.0000 mg | Freq: Every day | INTRAMUSCULAR | Status: DC
  Administered 2019-11-24 – 2019-11-25 (×2): 1 mg via INTRAVENOUS
  Filled 2019-11-24 (×4): qty 0.2

## 2019-11-24 MED ORDER — VASOPRESSIN 20 UNIT/ML IV SOLN
INTRAVENOUS | Status: DC | PRN
  Administered 2019-11-24: 2 [IU] via INTRAVENOUS

## 2019-11-24 MED ORDER — PHENYLEPHRINE 40 MCG/ML (10ML) SYRINGE FOR IV PUSH (FOR BLOOD PRESSURE SUPPORT)
PREFILLED_SYRINGE | INTRAVENOUS | Status: DC | PRN
  Administered 2019-11-24: 120 ug via INTRAVENOUS
  Administered 2019-11-24: 160 ug via INTRAVENOUS

## 2019-11-24 MED ORDER — CALCIUM CHLORIDE 10 % IV SOLN
INTRAVENOUS | Status: DC | PRN
  Administered 2019-11-24: 150 mg via INTRAVENOUS
  Administered 2019-11-24: 100 mg via INTRAVENOUS

## 2019-11-24 MED ORDER — ROCURONIUM BROMIDE 50 MG/5ML IV SOSY
PREFILLED_SYRINGE | INTRAVENOUS | Status: DC | PRN
  Administered 2019-11-24 (×2): 20 mg via INTRAVENOUS
  Administered 2019-11-24: 30 mg via INTRAVENOUS
  Administered 2019-11-24: 50 mg via INTRAVENOUS
  Administered 2019-11-24: 30 mg via INTRAVENOUS

## 2019-11-24 MED ORDER — LACTATED RINGERS IV SOLN: INTRAVENOUS | Status: DC | PRN

## 2019-11-24 MED ORDER — SUGAMMADEX SODIUM 200 MG/2ML IV SOLN
INTRAVENOUS | Status: DC | PRN
  Administered 2019-11-24: 200 mg via INTRAVENOUS

## 2019-11-24 MED ORDER — SODIUM CHLORIDE 0.9 % IV SOLN: INTRAVENOUS | Status: DC | PRN

## 2019-11-24 SURGICAL SUPPLY — 15 items

## 2019-11-24 NOTE — Progress Notes (Signed)
PROGRESS NOTE    TAIVON HAROON  ZOX:[id number] DOB: 04/24/1966 DOA: 11/19/2019 PCP: [name], MD   Brief Narrative: 53 year old male with significant past medical history of alcohol abuse, liver cirrhosis portal hypertension, COPD, daily alcohol abuse presented to ER on 12/10 with coffee-ground emesis since 1 day PTA about 15 episodes of vomiting.  EGD in 321 had portal gastropathy, in the ER occult blood positive hemoglobin 14.8 g GI was consulted and patient was admitted on active treatment patient on Protonix.  Patient was treated at any pain for acute blood loss anemia/GI bleed, alcohol abuse 12/11- had EGD at [id number] erosive reflux esophagitis.esophageal varices - new since last EGD (no bleeding stigmata). Gastric varices - new since last EGD. Marked portal gastropathy with mucosal bleeding Treated with CLIPP 12/12-patient had large bloody bowel movement, transferred to [facility] for IR evaluation 12/13-s/p  BRTO by IR. Ace anemia hb 14-->7gm' 12/13- intermittent NEO 12/15:Not on pressors- transferred to [facility]- having recurrent blood BMs-s/p 3 unit prbc 12/14 overnight and 1 unit platelets. 12/15- afternoon s/p repeat CTA  Abdomen showing good sclerosant changes at the network of GV's at the stomach-due to ongoing bleeding GI decided to take for EGD again that showed fresh active bleeding stomach unable to clear, ordered 2 units PRBC, 1 unit FFP- GI discussed with IR and pt is going down for  TIPS. 12/15-patient being transferred back to ICU service due on ongoing bleeding.  Subjective:  Alert,awake, wants his xanax, anxiou, BP stable, Wife at abedside RN  At bedside Dr Cristina Gong just left room CBC pending at 10 No chest pain, abd pain,fever. Overnight hypotensive, has had recurrent bleeding- large BM bloody stools and melanotic stool this am  Assessment & Plan:  Recurrent upper GI bleeding despite octreotide suspected from gastric variceal origin, EGD aggravates pain  showed erosive esophagitis and esophageal varices.  Status post BRTO by IR 12/13.  Patient continues to have recurrent bleeding and still anemic at 6.8 gm, despite 3 unit prbc and 1 unit platelet transfusion.  Discussed with Dr. Cristina Gong he has spoken to IR for repeat CTA, if unremarkable possible repeat EGD.  We will continue on octreotide drip, Protonix 40 every 12 hours, empiric ceftriaxone to prevent SBP.  Monitor serial H&H, repeat CBC after blood blood transfusion this morning.  Acute blood loss anemia, with hymodynamic instability was on pressors previously, blood pressure holding stable but tachycardic. 2/2 recurrent upper GI bleeding.  Patient has received multiple PRBC transfusions, cont to monitor h/h , transfsue prbc to keep hemoglobin 8 g or so.  Alcoholic liver cirrhosis/chronic alcohol abuse, continue CIWA Ativan.  Patient reports he had been through rehab but has had relapses.  Will need extensive counseling prior to discharge.  Very unfortunate situation as patient continues to be drinking alcohol in the setting of liver cirrhosis.  Continue thiamine folate.  Alcohol abuse:Last etoh 6-7 days ago per wife-drinks 2/5th whisky on daily basis , had been ot rehab but has had relapses.  Anxiety/depression: Mood is stable, continue Prozac.  Thrombocytopenia in the setting of alcoholic liver cirrhosis. plt was [facility]- s/p 1 units platelet transfusion  Abnormal LFTs with AST 133 ALT 39 total bili elevated at 8.8: Suspect in the setting of alcohol use and alcoholic liver disease.  AST is downtrending at 77, total bili 4.0.  Monitor  Hypotension in the setting of GI bleeding/liver cirrhosis.Blood pressure is stable in high 90s-low 100, not currently not on pressors.  Hypocalcemia/Hypomagnesemia: calcium is 6.6 but albumin  at 1.7; check mag level  Obesity withBody mass index is 34.61 kg/m. :  Will need wt loss and healthy lifestyle education.  After CT abdomen and endoscopy this afternoon  discussed with Dr. Therisa Doyne from GI, patient is going down for IR TIPS and/or BTO.  Patient has been ordered 2 units of PRBC and 1 unit FFP.   I discussed with critical care Dr. Carlis Abbott: Patient is transferred back to ICU service given critical nature of his illness, and need for close monitoring. Again overall prognosis is guarded in this unfortunate gentleman  DVT prophylaxis: SCD Code Status: FULL CODE Family Communication: plan of care discussed with patient and wife at bedside. Disposition Plan: Remains inpatient in ICU. Dispo-TBD, remains to be seen. Overall prognosis does not appear to be bright in this unfortunate gentleman.  Discussed with GI and nursing staff Consultants: GI, IR, PCCM.  Procedures: EGD RT IJ BRTO,  Microbiology:  Antimicrobials: Anti-infectives (From admission, onward)   Start     Dose/Rate Route Frequency Ordered Stop   11/22/19 1236  ceFAZolin (ANCEF) 2-4 GM/100ML-% IVPB    Note to Pharmacy: Margaretmary Dys   : cabinet override      11/22/19 1236 11/22/19 1424   11/22/19 1045  ceFAZolin (ANCEF) 3 g in dextrose 5 % 50 mL IVPB     3 g 100 mL/hr over 30 Minutes Intravenous To Radiology 11/22/19 1004 11/23/19 1045   11/20/19 1130  erythromycin 355 mg in sodium chloride 0.9 % 100 mL IVPB    Note to Pharmacy: To be given 30 minutes prior to EGD.   3 mg/kg  117.9 kg 100 mL/hr over 60 Minutes Intravenous  Once 11/20/19 1106 11/20/19 1732   11/20/19 0815  cefTRIAXone (ROCEPHIN) 2 g in sodium chloride 0.9 % 100 mL IVPB     2 g 200 mL/hr over 30 Minutes Intravenous Every 24 hours 11/20/19 0750         Objective: Vitals:   11/24/19 0600 11/24/19 0604 11/24/19 0728 11/24/19 0730  BP: 116/72 123/62  (!) 97/54  Pulse: (!) 115 (!) 114  (!) 126  Resp: 12 13  15   Temp:  98.2 F (36.8 C) 98.8 F (37.1 C)   TempSrc:  Oral Oral   SpO2: 98% 97%  100%  Weight:      Height:        Intake/Output Summary (Last 24 hours) at 11/24/2019 0939 Last data filed at  11/24/2019 0800 Gross per 24 hour  Intake 2682.69 ml  Output 1126 ml  Net 1556.69 ml   Filed Weights   11/19/19 2037 11/21/19 2000  Weight: 117.9 kg 119 kg   Weight change:   Body mass index is 34.61 kg/m.  Intake/Output from previous day: 12/14 0701 - 12/15 0700 In: 2309.3 [P.O.:350; I.V.:782.7; Blood:976.7; IV Piggyback:199.9] Out: 1126 [Urine:800; Stool:326] Intake/Output this shift: Total I/O In: 523.3 [I.V.:35; Blood:388.3; IV Piggyback:100] Out: -   Examination:  General exam: Alert,awake, obese, anxious. HEENT:Oral mucosa moist, Ear/Nose WNL grossly, dentition normal. Respiratory system: Diminished at the base,no wheezing or crackles,no use of accessory muscle Cardiovascular system: S1 & S2 +, No JVD,. Gastrointestinal system: Abdomen soft,  Distended, non tender BS+ Nervous System:Alert, awake, moving extremities and grossly nonfocal Extremities: No edema, distal peripheral pulses palpable.  Skin: No rashes,no icterus. MSK: Normal muscle bulk,tone, power RT IJ+  Medications:  Scheduled Meds: . sodium chloride   Intravenous Once  . sodium chloride   Intravenous Once  . Chlorhexidine Gluconate Cloth  6  each Topical Daily  . folic acid  1 mg Intravenous Daily  . multivitamin with minerals  1 tablet Oral Daily  . pantoprazole  40 mg Intravenous Q12H  . PARoxetine  20 mg Oral Daily  . sucralfate  1 g Oral TID WC & HS  . thiamine  100 mg Oral Daily   Or  . thiamine  100 mg Intravenous Daily   Continuous Infusions: . sodium chloride Stopped (11/22/19 0921)  . cefTRIAXone (ROCEPHIN)  IV 2 g (11/24/19 0759)  . octreotide  (SANDOSTATIN)    IV infusion 50 mcg/hr (11/24/19 0600)    Data Reviewed: I have personally reviewed following labs and imaging studies  CBC: Recent Labs  Lab 11/19/19 2123 11/20/19 0831 11/21/19 0457 11/21/19 1144 11/23/19 0829 11/23/19 1822 11/23/19 2009 11/23/19 2210 11/24/19 0134 11/24/19 0554  WBC 6.6 6.2 7.1 6.0 7.9  --    --   --   --   --   HGB 14.9 12.6* 9.5* 9.4* 7.9* 6.7* 6.6* 7.0* 6.9* 6.8*  HCT 43.4 37.8* 28.9* 28.5* 23.5* 19.1* 19.1* 20.8* 20.2* 20.2*  MCV 106.4* 108.6* 112.0* 111.3* 100.9*  --   --   --   --   --   PLT 94* 77* 86* 84* 77*  --   --   --   --   --    Basic Metabolic Panel: Recent Labs  Lab 11/19/19 2208 11/20/19 0456 11/21/19 0457 11/22/19 0712 11/23/19 0829 11/24/19 0554  NA 141  --  137 139 141 140  K 4.0  --  4.3 3.8 4.0 4.2  CL 99  --  99 108 109 110  CO2 29  --  29 20* 25 25  GLUCOSE 133*  --  147* 119* 121* 156*  BUN 11  --  14 18 15 15   CREATININE 0.71  --  1.07 0.94 0.90 1.12  CALCIUM 9.4  --  7.4* 6.6* 6.5* 6.6*  MG  --  1.2*  --  0.8*  --   --   PHOS  --  4.2  --  2.5  --   --    GFR: Estimated Creatinine Clearance: 105.4 mL/min (by C-G formula based on SCr of 1.12 mg/dL). Liver Function Tests: Recent Labs  Lab 11/19/19 2208 11/22/19 0712 11/23/19 0829  AST 133* 71* 77*  ALT 39 20 25  ALKPHOS 254* 104 85  BILITOT 8.8* 5.8* 4.0*  PROT 8.3* 4.6* 4.2*  ALBUMIN 3.1* 1.7* 1.8*   No results for input(s): LIPASE, AMYLASE in the last 168 hours. No results for input(s): AMMONIA in the last 168 hours. Coagulation Profile: Recent Labs  Lab 11/19/19 2208 11/22/19 2304  INR 1.3* 1.8*   Cardiac Enzymes: No results for input(s): CKTOTAL, CKMB, CKMBINDEX, TROPONINI in the last 168 hours. BNP (last 3 results) No results for input(s): PROBNP in the last 8760 hours. HbA1C: No results for input(s): HGBA1C in the last 72 hours. CBG: Recent Labs  Lab 11/21/19 2023 11/22/19 0040  GLUCAP 120* 114*   Lipid Profile: No results for input(s): CHOL, HDL, LDLCALC, TRIG, CHOLHDL, LDLDIRECT in the last 72 hours. Thyroid Function Tests: No results for input(s): TSH, T4TOTAL, FREET4, T3FREE, THYROIDAB in the last 72 hours. Anemia Panel: No results for input(s): VITAMINB12, FOLATE, FERRITIN, TIBC, IRON, RETICCTPCT in the last 72 hours. Sepsis Labs: No results for  input(s): PROCALCITON, LATICACIDVEN in the last 168 hours.  Recent Results (from the past 240 hour(s))  SARS Coronavirus 2 by RT PCR (  hospital order, performed in [facility] hospital lab)     Status: None   Collection Time: 11/19/19  9:33 PM  Result Value Ref Range Status   SARS Coronavirus 2 NEGATIVE NEGATIVE Final    Comment: (NOTE) SARS-CoV-2 target nucleic acids are NOT DETECTED. The SARS-CoV-2 RNA is generally detectable in upper and lower respiratory specimens during the acute phase of infection. The lowest concentration of SARS-CoV-2 viral copies this assay can detect is 250 copies / mL. A negative result does not preclude SARS-CoV-2 infection and should not be used as the sole basis for treatment or other patient management decisions.  A negative result may occur with improper specimen collection / handling, submission of specimen other than nasopharyngeal swab, presence of viral mutation(s) within the areas targeted by this assay, and inadequate number of viral copies (<250 copies / mL). A negative result must be combined with clinical observations, patient history, and epidemiological information. Fact Sheet for Patients:   StrictlyIdeas.no Fact Sheet for Healthcare Providers: BankingDealers.co.za This test is not yet approved or cleared  by the [place] FDA and has been authorized for detection and/or diagnosis of SARS-CoV-2 by FDA under an Emergency Use Authorization (EUA).  This EUA will remain in effect (meaning this test can be used) for the duration of the COVID-19 declaration under Section 564(b)(1) of the Act, 21 U.S.C. section 360bbb-3(b)(1), unless the authorization is terminated or revoked sooner. Performed at [facility], [place] [address], [place], [place] [address]   MRSA PCR Screening     Status: None   Collection Time: 11/21/19  4:43 AM   Specimen: Nasal Mucosa; Nasopharyngeal  Result Value Ref  Range Status   MRSA by PCR NEGATIVE NEGATIVE Final    Comment:        The GeneXpert MRSA Assay (FDA approved for NASAL specimens only), is one component of a comprehensive MRSA colonization surveillance program. It is not intended to diagnose MRSA infection nor to guide or monitor treatment for MRSA infections. Performed at [facility], [address] [place], [place] [address]       Radiology Studies: DG Chest 1 View  Result Date: 11/22/2019 CLINICAL DATA:  Central line placement EXAM: CHEST  1 VIEW COMPARISON:  06/01/2018 FINDINGS: Right neck vascular catheter is positioned with tip near the superior cavoatrial junction. Cardiomegaly. Mild, diffuse interstitial pulmonary opacity. IMPRESSION: 1. Right neck vascular catheter with tip near the superior cavoatrial junction. 2. Cardiomegaly with mild diffuse interstitial pulmonary opacity, which may reflect edema or infection. Electronically Signed   By: Eddie Candle M.D.   On: 11/22/2019 14:59   IR Angiogram Selective Each Additional Vessel  Result Date: 11/22/2019 CLINICAL DATA:  53 year old male with alcoholic cirrhosis and upper at GI hemorrhage, transferred from [facility] with gastric variceal hemorrhage, status post endoscopic identification and clipping. He has continued to have GI blood loss with need for pressor support, and presents now for urgent treatment with BRTO and/or TIPS EXAM: ULTRASOUND GUIDED ACCESS RIGHT COMMON FEMORAL VEIN LEFT RENAL VEIN ANGIOGRAM RETROGRADE SCLEROTHERAPY OF GASTRIC VARICES, ASSISTED WITH COIL EMBOLIZATION MEDICATIONS: As antibiotic prophylaxis, 2 G ANCEF was ordered pre-procedure and administered intravenously within one hour of incision. One unit of platelets was also administered IV during the procedure. ANESTHESIA/SEDATION: General - as administered by the Anesthesia department CONTRAST:  90 cc IV contrast FLUOROSCOPY TIME:  Fluoroscopy Time: 19 minutes 42 seconds (12/29/2041 mGy).  COMPLICATIONS: None PROCEDURE: Informed written consent was obtained from the patient and the patient's  family after a thorough discussion of the procedural risks, benefits and alternatives. Specific risks that were addressed regarding BRTO include, bleeding, infection, contrast reaction, kidney injury, need for further procedure or surgery including TIPS and/ or endoscopy, pulmonary embolism, stroke, worsening of ascites, worsening of esophageal varices, paradoxical liver failure, varix rupture, cardiopulmonary collapse, death. All questions were addressed. Maximal Sterile Barrier Technique was utilized including caps, mask, sterile gowns, sterile gloves, sterile drape, hand hygiene and skin antiseptic. A timeout was performed prior to the initiation of the procedure. Ultrasound survey of the right inguinal region was performed with images stored and sent to PACs. A single wall puncture needle was used access the right common femoral artery under ultrasound guidance with saline syringe. With venous blood flow returned, a Bentson wire was observed to enter the common femoral vein and the IVC under fluoroscopy. The needle was removed, and a small incision was made, and 9 [place] dilator was passed over the Bentson wire. A 10 French TIPS sheath was then passed over the Bentson wire after manually creating a significant leftward current on the catheter. The inner dilator and wire were removed, and a 5 [place] cobra catheter was passed over the Bentson wire into the IVC. Cobra catheter was used to select the left renal vein orifice. Combination of the Bentson wire, Glidewire, Rosen wire were used to gain purchased into the renal vein, with renal vein limited venogram performed to identify the in flowing gastro renal shunt. With a safety curve on a stiff Amplatz wire position into superior pole renal vein, the TIPS sheath was advanced into the left renal vein over the Cobra catheter. Pullback reinforced straight sheath  selection technique (PRESSS) was performed, and as the tip sheath was withdrawn, contrast was used to identified the inflow of the gastro renal shunt. With the coaxial Amplatz wire in position, a combination of a standard Kumpe catheter and a 4 French angled glide catheter were used to cannulate the orifice of the gastro renal shunt flowing shunt opposite the adrenal vein inflow. Once the Glidewire was advanced into the shunt, the catheter was advanced, and the Glidewire was exchanged for a stiff Amplatz wire with a safety curve on the tip. The renal vein buddy wire was withdrawn, and the TIPS sheath was advanced into the gastro renal shunt. As the 10 [place] TIPS sheath was clearly not long enough at 35 cm, a 45 cm 12 French sheath was placed. This sheath was then advanced into the most inferior aspects of the shunt orifice into the renal vein, though would not advance further given the length. Once the glide catheter and the Glidewire were in a reasonable location within the shunt, the catheter was withdrawn and then a balloon occlusion catheter was advanced over the Amplatz wire into the gastro renal shunt. Amplatz wire was then withdrawn. Angiogram was then performed to confirmed location within the gastro renal shunt outflow. Initial balloon inflation did not achieve stasis of the shunt, and the balloon was deflated with repositioning needed. There was difficulty passing the [place] balloon catheter into a more distal position of the shunt, likely secondary to tortuosity and/or venous web. Exchange for an 8 [place] Merci balloon, 95 cm, was performed on an Amplatz wire. A more distal position within the shunt was achieved. Balloon on the [facility] was inflated on the balloon occlusion catheter and was withdrawn to the first encountered web to achieve hemostasis. Angiogram confirmed stasis of flow. A CO2 angiogram was also performed, identifying the posterior  gastric veins as the stopping point. A penumbra  Lantern microcatheter and a fathom wire were navigated into the shunt. Once we confirmed location within the outflow with standard angiogram, treatment was initiated. A foamed solution of detergent/sclerosant was mixed in a standard Tessari method, with 1:2:3 ratio of ethiodized oil: sodium tetradecyl sulfate: Room air. The sclerosed sent was infused under fluoroscopic guidance, with observation of reflux into the afferent veins. Once reflux was identified into the posterior gastric vein, sclerosing was completed. This required only approximately 15 cc of foam sclerotherapy in the above ratio. Once the posterior gastric veins were identified, we stop treatment. The micro catheter was withdrawn to the tip of the balloon occlusion catheter, and coil mass was deposited with deployment of multiple Ruby coils. This was performed with 24 by 60 cm coil, 28 x 60 cm coil, and 3 by 32 x 60 cm coil. The balloon was deflated under fluoroscopy, with no motion identified. The Merci balloon was slightly withdrawn into the more distal shunt (closer to the renal vein) and angiogram was performed confirming complete stasis of flow beyond the coil mass. Once we were confident there was no migration, balloon was withdrawn and the sheath was withdrawn. Hemostasis was achieved with manual compression. Patient remained hemodynamically stable throughout. No complications were encountered and no significant blood loss was encountered. IMPRESSION: Status post ultrasound guided access right common femoral vein for coil-assisted BRTO (sclerotherapy) as treatment for life-threatening gastric variceal hemorrhage. Signed, Dulcy Fanny. Dellia Nims, [facility] Radiology Plan PLAN: Observe in PACU, then returned ICU Continue current management including serial H and H Local wound care management at the common femoral vein access site Repeat CT angiogram abdomen/pelvis after 24 hours-48 hours on this  admission Follow-up vascular Interventional Radiology Clinic in 4-8 weeks Electronically Signed   By: Corrie Mckusick D.O.   On: 11/22/2019 14:32   IR Venogram Renal Uni Left  Result Date: 11/22/2019 CLINICAL DATA:  53 year old male with alcoholic cirrhosis and upper at GI hemorrhage, transferred from [facility] with gastric variceal hemorrhage, status post endoscopic identification and clipping. He has continued to have GI blood loss with need for pressor support, and presents now for urgent treatment with BRTO and/or TIPS EXAM: ULTRASOUND GUIDED ACCESS RIGHT COMMON FEMORAL VEIN LEFT RENAL VEIN ANGIOGRAM RETROGRADE SCLEROTHERAPY OF GASTRIC VARICES, ASSISTED WITH COIL EMBOLIZATION MEDICATIONS: As antibiotic prophylaxis, 2 G ANCEF was ordered pre-procedure and administered intravenously within one hour of incision. One unit of platelets was also administered IV during the procedure. ANESTHESIA/SEDATION: General - as administered by the Anesthesia department CONTRAST:  90 cc IV contrast FLUOROSCOPY TIME:  Fluoroscopy Time: 19 minutes 42 seconds (12/29/2041 mGy). COMPLICATIONS: None PROCEDURE: Informed written consent was obtained from the patient and the patient's family after a thorough discussion of the procedural risks, benefits and alternatives. Specific risks that were addressed regarding BRTO include, bleeding, infection, contrast reaction, kidney injury, need for further procedure or surgery including TIPS and/ or endoscopy, pulmonary embolism, stroke, worsening of ascites, worsening of esophageal varices, paradoxical liver failure, varix rupture, cardiopulmonary collapse, death. All questions were addressed. Maximal Sterile Barrier Technique was utilized including caps, mask, sterile gowns, sterile gloves, sterile drape, hand hygiene and skin antiseptic. A timeout was performed prior to the initiation of the procedure. Ultrasound survey of the right inguinal region was performed with images stored  and sent to PACs. A single wall puncture needle was used access the right common femoral artery under ultrasound guidance  with saline syringe. With venous blood flow returned, a Bentson wire was observed to enter the common femoral vein and the IVC under fluoroscopy. The needle was removed, and a small incision was made, and 9 [place] dilator was passed over the Bentson wire. A 10 French TIPS sheath was then passed over the Bentson wire after manually creating a significant leftward current on the catheter. The inner dilator and wire were removed, and a 5 [place] cobra catheter was passed over the Bentson wire into the IVC. Cobra catheter was used to select the left renal vein orifice. Combination of the Bentson wire, Glidewire, Rosen wire were used to gain purchased into the renal vein, with renal vein limited venogram performed to identify the in flowing gastro renal shunt. With a safety curve on a stiff Amplatz wire position into superior pole renal vein, the TIPS sheath was advanced into the left renal vein over the Cobra catheter. Pullback reinforced straight sheath selection technique (PRESSS) was performed, and as the tip sheath was withdrawn, contrast was used to identified the inflow of the gastro renal shunt. With the coaxial Amplatz wire in position, a combination of a standard Kumpe catheter and a 4 French angled glide catheter were used to cannulate the orifice of the gastro renal shunt flowing shunt opposite the adrenal vein inflow. Once the Glidewire was advanced into the shunt, the catheter was advanced, and the Glidewire was exchanged for a stiff Amplatz wire with a safety curve on the tip. The renal vein buddy wire was withdrawn, and the TIPS sheath was advanced into the gastro renal shunt. As the 10 [place] TIPS sheath was clearly not long enough at 35 cm, a 45 cm 12 French sheath was placed. This sheath was then advanced into the most inferior aspects of the shunt orifice into the renal vein, though  would not advance further given the length. Once the glide catheter and the Glidewire were in a reasonable location within the shunt, the catheter was withdrawn and then a balloon occlusion catheter was advanced over the Amplatz wire into the gastro renal shunt. Amplatz wire was then withdrawn. Angiogram was then performed to confirmed location within the gastro renal shunt outflow. Initial balloon inflation did not achieve stasis of the shunt, and the balloon was deflated with repositioning needed. There was difficulty passing the [place] balloon catheter into a more distal position of the shunt, likely secondary to tortuosity and/or venous web. Exchange for an 8 [place] Merci balloon, 95 cm, was performed on an Amplatz wire. A more distal position within the shunt was achieved. Balloon on the [facility] was inflated on the balloon occlusion catheter and was withdrawn to the first encountered web to achieve hemostasis. Angiogram confirmed stasis of flow. A CO2 angiogram was also performed, identifying the posterior gastric veins as the stopping point. A penumbra Lantern microcatheter and a fathom wire were navigated into the shunt. Once we confirmed location within the outflow with standard angiogram, treatment was initiated. A foamed solution of detergent/sclerosant was mixed in a standard Tessari method, with 1:2:3 ratio of ethiodized oil: sodium tetradecyl sulfate: Room air. The sclerosed sent was infused under fluoroscopic guidance, with observation of reflux into the afferent veins. Once reflux was identified into the posterior gastric vein, sclerosing was completed. This required only approximately 15 cc of foam sclerotherapy in the above ratio. Once the posterior gastric veins were identified, we stop treatment. The micro catheter was withdrawn to the tip of the balloon occlusion catheter, and coil mass  was deposited with deployment of multiple Ruby coils. This was performed with 24 by 60 cm coil, 28 x  60 cm coil, and 3 by 32 x 60 cm coil. The balloon was deflated under fluoroscopy, with no motion identified. The Merci balloon was slightly withdrawn into the more distal shunt (closer to the renal vein) and angiogram was performed confirming complete stasis of flow beyond the coil mass. Once we were confident there was no migration, balloon was withdrawn and the sheath was withdrawn. Hemostasis was achieved with manual compression. Patient remained hemodynamically stable throughout. No complications were encountered and no significant blood loss was encountered. IMPRESSION: Status post ultrasound guided access right common femoral vein for coil-assisted BRTO (sclerotherapy) as treatment for life-threatening gastric variceal hemorrhage. Signed, Dulcy Fanny. Dellia Nims, [place] [facility] Radiology Plan PLAN: Observe in PACU, then returned ICU Continue current management including serial H and H Local wound care management at the common femoral vein access site Repeat CT angiogram abdomen/pelvis after 24 hours-48 hours on this admission Follow-up vascular Interventional Radiology Clinic in 4-8 weeks Electronically Signed   By: Corrie Mckusick D.O.   On: 11/22/2019 14:32   IR US Guide Vasc Access Right  Result Date: 11/22/2019 CLINICAL DATA:  53 year old male with alcoholic cirrhosis and upper at GI hemorrhage, transferred from [facility] with gastric variceal hemorrhage, status post endoscopic identification and clipping. He has continued to have GI blood loss with need for pressor support, and presents now for urgent treatment with BRTO and/or TIPS EXAM: ULTRASOUND GUIDED ACCESS RIGHT COMMON FEMORAL VEIN LEFT RENAL VEIN ANGIOGRAM RETROGRADE SCLEROTHERAPY OF GASTRIC VARICES, ASSISTED WITH COIL EMBOLIZATION MEDICATIONS: As antibiotic prophylaxis, 2 G ANCEF was ordered pre-procedure and administered intravenously within one hour of incision. One unit of platelets  was also administered IV during the procedure. ANESTHESIA/SEDATION: General - as administered by the Anesthesia department CONTRAST:  90 cc IV contrast FLUOROSCOPY TIME:  Fluoroscopy Time: 19 minutes 42 seconds (12/29/2041 mGy). COMPLICATIONS: None PROCEDURE: Informed written consent was obtained from the patient and the patient's family after a thorough discussion of the procedural risks, benefits and alternatives. Specific risks that were addressed regarding BRTO include, bleeding, infection, contrast reaction, kidney injury, need for further procedure or surgery including TIPS and/ or endoscopy, pulmonary embolism, stroke, worsening of ascites, worsening of esophageal varices, paradoxical liver failure, varix rupture, cardiopulmonary collapse, death. All questions were addressed. Maximal Sterile Barrier Technique was utilized including caps, mask, sterile gowns, sterile gloves, sterile drape, hand hygiene and skin antiseptic. A timeout was performed prior to the initiation of the procedure. Ultrasound survey of the right inguinal region was performed with images stored and sent to PACs. A single wall puncture needle was used access the right common femoral artery under ultrasound guidance with saline syringe. With venous blood flow returned, a Bentson wire was observed to enter the common femoral vein and the IVC under fluoroscopy. The needle was removed, and a small incision was made, and 9 [place] dilator was passed over the Bentson wire. A 10 French TIPS sheath was then passed over the Bentson wire after manually creating a significant leftward current on the catheter. The inner dilator and wire were removed, and a 5 [place] cobra catheter was passed over the Bentson wire into the IVC. Cobra catheter was used to select the left renal vein orifice. Combination of the Bentson wire, Glidewire, Rosen wire were used to gain purchased into the renal vein, with renal vein limited venogram performed  to identify the in  flowing gastro renal shunt. With a safety curve on a stiff Amplatz wire position into superior pole renal vein, the TIPS sheath was advanced into the left renal vein over the Cobra catheter. Pullback reinforced straight sheath selection technique (PRESSS) was performed, and as the tip sheath was withdrawn, contrast was used to identified the inflow of the gastro renal shunt. With the coaxial Amplatz wire in position, a combination of a standard Kumpe catheter and a 4 French angled glide catheter were used to cannulate the orifice of the gastro renal shunt flowing shunt opposite the adrenal vein inflow. Once the Glidewire was advanced into the shunt, the catheter was advanced, and the Glidewire was exchanged for a stiff Amplatz wire with a safety curve on the tip. The renal vein buddy wire was withdrawn, and the TIPS sheath was advanced into the gastro renal shunt. As the 10 [place] TIPS sheath was clearly not long enough at 35 cm, a 45 cm 12 French sheath was placed. This sheath was then advanced into the most inferior aspects of the shunt orifice into the renal vein, though would not advance further given the length. Once the glide catheter and the Glidewire were in a reasonable location within the shunt, the catheter was withdrawn and then a balloon occlusion catheter was advanced over the Amplatz wire into the gastro renal shunt. Amplatz wire was then withdrawn. Angiogram was then performed to confirmed location within the gastro renal shunt outflow. Initial balloon inflation did not achieve stasis of the shunt, and the balloon was deflated with repositioning needed. There was difficulty passing the [place] balloon catheter into a more distal position of the shunt, likely secondary to tortuosity and/or venous web. Exchange for an 8 [place] Merci balloon, 95 cm, was performed on an Amplatz wire. A more distal position within the shunt was achieved. Balloon on the [facility] was inflated on the balloon occlusion  catheter and was withdrawn to the first encountered web to achieve hemostasis. Angiogram confirmed stasis of flow. A CO2 angiogram was also performed, identifying the posterior gastric veins as the stopping point. A penumbra Lantern microcatheter and a fathom wire were navigated into the shunt. Once we confirmed location within the outflow with standard angiogram, treatment was initiated. A foamed solution of detergent/sclerosant was mixed in a standard Tessari method, with 1:2:3 ratio of ethiodized oil: sodium tetradecyl sulfate: Room air. The sclerosed sent was infused under fluoroscopic guidance, with observation of reflux into the afferent veins. Once reflux was identified into the posterior gastric vein, sclerosing was completed. This required only approximately 15 cc of foam sclerotherapy in the above ratio. Once the posterior gastric veins were identified, we stop treatment. The micro catheter was withdrawn to the tip of the balloon occlusion catheter, and coil mass was deposited with deployment of multiple Ruby coils. This was performed with 24 by 60 cm coil, 28 x 60 cm coil, and 3 by 32 x 60 cm coil. The balloon was deflated under fluoroscopy, with no motion identified. The Merci balloon was slightly withdrawn into the more distal shunt (closer to the renal vein) and angiogram was performed confirming complete stasis of flow beyond the coil mass. Once we were confident there was no migration, balloon was withdrawn and the sheath was withdrawn. Hemostasis was achieved with manual compression. Patient remained hemodynamically stable throughout. No complications were encountered and no significant blood loss was encountered. IMPRESSION: Status post ultrasound guided access right common femoral vein for coil-assisted BRTO (sclerotherapy)  as treatment for life-threatening gastric variceal hemorrhage. Signed, Dulcy Fanny. Dellia Nims, [place] [facility] Radiology Plan  PLAN: Observe in PACU, then returned ICU Continue current management including serial H and H Local wound care management at the common femoral vein access site Repeat CT angiogram abdomen/pelvis after 24 hours-48 hours on this admission Follow-up vascular Interventional Radiology Clinic in 4-8 weeks Electronically Signed   By: Corrie Mckusick D.O.   On: 11/22/2019 14:32   IR EMBO ART  VEN HEMORR LYMPH EXTRAV  INC GUIDE ROADMAPPING  Result Date: 11/22/2019 CLINICAL DATA:  53 year old male with alcoholic cirrhosis and upper at GI hemorrhage, transferred from [facility] with gastric variceal hemorrhage, status post endoscopic identification and clipping. He has continued to have GI blood loss with need for pressor support, and presents now for urgent treatment with BRTO and/or TIPS EXAM: ULTRASOUND GUIDED ACCESS RIGHT COMMON FEMORAL VEIN LEFT RENAL VEIN ANGIOGRAM RETROGRADE SCLEROTHERAPY OF GASTRIC VARICES, ASSISTED WITH COIL EMBOLIZATION MEDICATIONS: As antibiotic prophylaxis, 2 G ANCEF was ordered pre-procedure and administered intravenously within one hour of incision. One unit of platelets was also administered IV during the procedure. ANESTHESIA/SEDATION: General - as administered by the Anesthesia department CONTRAST:  90 cc IV contrast FLUOROSCOPY TIME:  Fluoroscopy Time: 19 minutes 42 seconds (12/29/2041 mGy). COMPLICATIONS: None PROCEDURE: Informed written consent was obtained from the patient and the patient's family after a thorough discussion of the procedural risks, benefits and alternatives. Specific risks that were addressed regarding BRTO include, bleeding, infection, contrast reaction, kidney injury, need for further procedure or surgery including TIPS and/ or endoscopy, pulmonary embolism, stroke, worsening of ascites, worsening of esophageal varices, paradoxical liver failure, varix rupture, cardiopulmonary collapse, death. All questions were addressed. Maximal Sterile Barrier Technique  was utilized including caps, mask, sterile gowns, sterile gloves, sterile drape, hand hygiene and skin antiseptic. A timeout was performed prior to the initiation of the procedure. Ultrasound survey of the right inguinal region was performed with images stored and sent to PACs. A single wall puncture needle was used access the right common femoral artery under ultrasound guidance with saline syringe. With venous blood flow returned, a Bentson wire was observed to enter the common femoral vein and the IVC under fluoroscopy. The needle was removed, and a small incision was made, and 9 [place] dilator was passed over the Bentson wire. A 10 French TIPS sheath was then passed over the Bentson wire after manually creating a significant leftward current on the catheter. The inner dilator and wire were removed, and a 5 [place] cobra catheter was passed over the Bentson wire into the IVC. Cobra catheter was used to select the left renal vein orifice. Combination of the Bentson wire, Glidewire, Rosen wire were used to gain purchased into the renal vein, with renal vein limited venogram performed to identify the in flowing gastro renal shunt. With a safety curve on a stiff Amplatz wire position into superior pole renal vein, the TIPS sheath was advanced into the left renal vein over the Cobra catheter. Pullback reinforced straight sheath selection technique (PRESSS) was performed, and as the tip sheath was withdrawn, contrast was used to identified the inflow of the gastro renal shunt. With the coaxial Amplatz wire in position, a combination of a standard Kumpe catheter and a 4 French angled glide catheter were used to cannulate the orifice of the gastro renal shunt flowing shunt opposite the adrenal vein inflow. Once the Glidewire was advanced into the shunt, the catheter was  advanced, and the Glidewire was exchanged for a stiff Amplatz wire with a safety curve on the tip. The renal vein buddy wire was withdrawn, and the TIPS  sheath was advanced into the gastro renal shunt. As the 10 [place] TIPS sheath was clearly not long enough at 35 cm, a 45 cm 12 French sheath was placed. This sheath was then advanced into the most inferior aspects of the shunt orifice into the renal vein, though would not advance further given the length. Once the glide catheter and the Glidewire were in a reasonable location within the shunt, the catheter was withdrawn and then a balloon occlusion catheter was advanced over the Amplatz wire into the gastro renal shunt. Amplatz wire was then withdrawn. Angiogram was then performed to confirmed location within the gastro renal shunt outflow. Initial balloon inflation did not achieve stasis of the shunt, and the balloon was deflated with repositioning needed. There was difficulty passing the [place] balloon catheter into a more distal position of the shunt, likely secondary to tortuosity and/or venous web. Exchange for an 8 [place] Merci balloon, 95 cm, was performed on an Amplatz wire. A more distal position within the shunt was achieved. Balloon on the [facility] was inflated on the balloon occlusion catheter and was withdrawn to the first encountered web to achieve hemostasis. Angiogram confirmed stasis of flow. A CO2 angiogram was also performed, identifying the posterior gastric veins as the stopping point. A penumbra Lantern microcatheter and a fathom wire were navigated into the shunt. Once we confirmed location within the outflow with standard angiogram, treatment was initiated. A foamed solution of detergent/sclerosant was mixed in a standard Tessari method, with 1:2:3 ratio of ethiodized oil: sodium tetradecyl sulfate: Room air. The sclerosed sent was infused under fluoroscopic guidance, with observation of reflux into the afferent veins. Once reflux was identified into the posterior gastric vein, sclerosing was completed. This required only approximately 15 cc of foam sclerotherapy in the above ratio.  Once the posterior gastric veins were identified, we stop treatment. The micro catheter was withdrawn to the tip of the balloon occlusion catheter, and coil mass was deposited with deployment of multiple Ruby coils. This was performed with 24 by 60 cm coil, 28 x 60 cm coil, and 3 by 32 x 60 cm coil. The balloon was deflated under fluoroscopy, with no motion identified. The Merci balloon was slightly withdrawn into the more distal shunt (closer to the renal vein) and angiogram was performed confirming complete stasis of flow beyond the coil mass. Once we were confident there was no migration, balloon was withdrawn and the sheath was withdrawn. Hemostasis was achieved with manual compression. Patient remained hemodynamically stable throughout. No complications were encountered and no significant blood loss was encountered. IMPRESSION: Status post ultrasound guided access right common femoral vein for coil-assisted BRTO (sclerotherapy) as treatment for life-threatening gastric variceal hemorrhage. Signed, Dulcy Fanny. Dellia Nims, [place] [facility] Radiology Plan PLAN: Observe in PACU, then returned ICU Continue current management including serial H and H Local wound care management at the common femoral vein access site Repeat CT angiogram abdomen/pelvis after 24 hours-48 hours on this admission Follow-up vascular Interventional Radiology Clinic in 4-8 weeks Electronically Signed   By: Corrie Mckusick D.O.   On: 11/22/2019 14:32      LOS: 4 days   Time spent: More than 50% of that time was spent in counseling and/or coordination of care.  Antonieta Pert, MD [facility]  11/24/2019, 9:39  AM

## 2019-11-24 NOTE — Brief Op Note (Signed)
11/19/2019 - 11/24/2019  2:55 PM  PATIENT:  [name]  53 y.o. male  PRE-OPERATIVE DIAGNOSIS:  variceal bleeding, post BRTO  POST-OPERATIVE DIAGNOSIS:  fresh active bleeding in stomach unable to clear  PROCEDURE:  Procedure(s): ESOPHAGOGASTRODUODENOSCOPY (EGD) WITH PROPOFOL (N/A)  SURGEON:  Surgeon(s) and Role:    Ronnette Juniper, MD - Primary  PHYSICIAN ASSISTANT:   ASSISTANTS: Kingsley Plan, RN, Drenda Freeze  ANESTHESIA:   MAC  EBL:  Large amount on ongoing fresh bleeding noted from fundus and cardia  BLOOD ADMINISTERED:none  DRAINS: none   LOCAL MEDICATIONS USED:  NONE  SPECIMEN:  No Specimen  DISPOSITION OF SPECIMEN:  N/A  COUNTS:  YES  TOURNIQUET:  * No tourniquets in log *  DICTATION: .Dragon Dictation  PLAN OF CARE: Admit to inpatient   PATIENT DISPOSITION:  PACU - guarded condition.   Delay start of Pharmacological VTE agent (>24hrs) due to surgical blood loss or risk of bleeding: yes

## 2019-11-24 NOTE — Progress Notes (Signed)
Referring Physician(s): Dr. Clearence Ped  Supervising Physician: Dr. Earleen Newport  Patient Status:  [facility] - In-pt  Chief Complaint:  53 y.o. male. History of alcohol abuse, cirrohosis and portal hypertension presented to [facility] with coffee ground emesis. EDG showed gastritis and new esophageal varies with large volume hematemesis. Transferred to [facility]. IR performed a BRTO on 12.13.20 - 8 Fr Merci Balloon  Sclerosed  Stent  In posterior gastric vein and 95 cm with 24 by 60 cm coil, 28 x 60 cm coil, and 3 by 32 x 60 cm coil ruby coils   Subjective: Patient alert to person. Had some Ativan so a little sleepy but awake enough to converse. Wife at bedside. Also discussed with Dr. Cristina Gong. Pt still having melena stools. Received 3 units PRBC and Hgb still 6.8 this am. Pt denies abd pain   Allergies: Patient has no known allergies.  Medications:  Current Facility-Administered Medications:  .  0.9 %  sodium chloride infusion (Manually program via Guardrails IV Fluids), , Intravenous, Once, Gleason, Mickel Baas R, PA-C .  0.9 %  sodium chloride infusion (Manually program via Guardrails IV Fluids), , Intravenous, Once, Brahmbhatt, Parag, MD .  0.9 %  sodium chloride infusion, 250 mL, Intravenous, Continuous, Olalere, Adewale A, MD, Stopped at 11/22/19 0921 .  cefTRIAXone (ROCEPHIN) 2 g in sodium chloride 0.9 % 100 mL IVPB, 2 g, Intravenous, Q24H, Barton Dubois, MD, Last Rate: 200 mL/hr at 11/24/19 0759, 2 g at 11/24/19 0759 .  Chlorhexidine Gluconate Cloth 2 % PADS 6 each, 6 each, Topical, Daily, Barton Dubois, MD, 6 each at 11/23/19 0908 .  folic acid injection 1 mg, 1 mg, Intravenous, Daily, Kc, Ramesh, MD, 1 mg at 11/24/19 0950 .  LORazepam (ATIVAN) tablet 1-4 mg, 1-4 mg, Oral, Q1H PRN, 1 mg at 11/23/19 0906 **OR** LORazepam (ATIVAN) injection 1-4 mg, 1-4 mg, Intravenous, Q1H PRN, Rush Farmer, MD, 1 mg at 11/24/19 0943 .  multivitamin with minerals tablet 1 tablet, 1 tablet, Oral,  Daily, Barton Dubois, MD, 1 tablet at 11/23/19 [id number] .  octreotide (SANDOSTATIN) 500 mcg in sodium chloride 0.9 % 250 mL (2 mcg/mL) infusion, 50 mcg/hr, Intravenous, Continuous, Barton Dubois, MD, Last Rate: 25 mL/hr at 11/24/19 0600, 50 mcg/hr at 11/24/19 0600 .  ondansetron (ZOFRAN) tablet 4 mg, 4 mg, Oral, Q6H PRN **OR** ondansetron (ZOFRAN) injection 4 mg, 4 mg, Intravenous, Q6H PRN, Barton Dubois, MD, 4 mg at 11/22/19 0758 .  pantoprazole (PROTONIX) injection 40 mg, 40 mg, Intravenous, Q12H, Barton Dubois, MD, 40 mg at 11/24/19 0930 .  PARoxetine (PAXIL) tablet 20 mg, 20 mg, Oral, Daily, Barton Dubois, MD, 20 mg at 11/24/19 0930 .  sucralfate (CARAFATE) 1 GM/10ML suspension 1 g, 1 g, Oral, TID WC & HS, Barton Dubois, MD, Stopped at 11/23/19 2209 .  thiamine (VITAMIN B-1) tablet 100 mg, 100 mg, Oral, Daily, 100 mg at 11/23/19 0907 **OR** thiamine (B-1) injection 100 mg, 100 mg, Intravenous, Daily, Barton Dubois, MD, 100 mg at 11/24/19 6812    Vital Signs: BP (!) 97/54   Pulse (!) 126   Temp 98.8 F (37.1 C) (Oral)   Resp 15   Ht 6\' 1"  (1.854 m)   Wt 119 kg   SpO2 100%   BMI 34.61 kg/m   Physical Exam Vitals and nursing note reviewed.  Constitutional:      General: He is not in acute distress.    Appearance: He is well-developed.  HENT:     Head:  Normocephalic.  Cardiovascular:     Rate and Rhythm: Normal rate and regular rhythm.  Pulmonary:     Effort: Pulmonary effort is normal.  Musculoskeletal:        General: Normal range of motion.     Cervical back: Normal range of motion.  Skin:    General: Skin is warm and dry.     Comments: Right femoral access site unremarkable with no pseudoaneurysm noted.. Dressing is clean dry and intact.  Neurological:     Mental Status: He is alert and oriented to person, place, and time.       Labs:  CBC: Recent Labs    11/20/19 0831 11/21/19 0457 11/21/19 1144 11/23/19 0829 11/23/19 2009 11/23/19 2210 11/24/19 0134  11/24/19 0554  WBC 6.2 7.1 6.0 7.9  --   --   --   --   HGB 12.6* 9.5* 9.4* 7.9* 6.6* 7.0* 6.9* 6.8*  HCT 37.8* 28.9* 28.5* 23.5* 19.1* 20.8* 20.2* 20.2*  PLT 77* 86* 84* 77*  --   --   --   --     COAGS: Recent Labs    06/03/19 1546 08/19/19 1304 11/19/19 2208 11/22/19 2304  INR 1.2* 1.1 1.3* 1.8*  APTT  --   --  41*  --     BMP: Recent Labs    11/21/19 0457 11/22/19 0712 11/23/19 0829 11/24/19 0554  NA 137 139 141 140  K 4.3 3.8 4.0 4.2  CL 99 108 109 110  CO2 29 20* 25 25  GLUCOSE 147* 119* 121* 156*  BUN 14 18 15 15   CALCIUM 7.4* 6.6* 6.5* 6.6*  CREATININE 1.07 0.94 0.90 1.12  GFRNONAA >60 >60 >60 >60  GFRAA >60 >60 >60 >60    LIVER FUNCTION TESTS: Recent Labs    07/27/19 1630 08/19/19 1304 11/19/19 2208 11/22/19 0712 11/23/19 0829  BILITOT 3.3* 1.5* 8.8* 5.8* 4.0*  AST 86* 61* 133* 71* 77*  ALT 34 26 39 20 25  ALKPHOS 199*  --  254* 104 85  PROT 7.4 6.8 8.3* 4.6* 4.2*  ALBUMIN 2.9*  --  3.1* 1.7* 1.8*    Assessment and Plan: 53 y.o. male. History of alcohol abuse, cirrohosis and portal hypertension presented to [facility] with coffee ground emesis. EDG showed gastritis and new esophageal varies. Patient continued with large volume hematemesis and was transferred to [facility]. IR performed a BRTO on 12.13.20 - 8 Fr Merci Balloon with sclerosed  stent  in posterior gastric vein and 95 cm with 24 by 60 cm coil, 28 x 60 cm coil, and 3 by 32 x 60 cm coil ruby coils   Hgb remains at 6.8 despite PRBC. Also having dark bloody BMs despite octreotide. Concern for continued bleeding. Pt stable, no use of pressors at present. Await repeat H&H. Discussed with Dr. Cristina Gong and Dr. Earleen Newport, will order CT Angio to reassess. Dr. Cristina Gong feels repeat endo would likely be low yield at this time. Pt and wife recall previous conversations that TIPS procedure may ultimately be necessary   Electronically Signed: Ascencion Dike, PA-C 11/24/2019, 10:20 AM   I spent a  total of 15 Minutes at the the patient's bedside AND on the patient's hospital floor or unit, greater than 50% of which was counseling/coordinating care for post BRTO procedure.

## 2019-11-24 NOTE — Anesthesia Postprocedure Evaluation (Signed)
Anesthesia Post Note  Patient: Bradley Miles  Procedure(s) Performed: ESOPHAGOGASTRODUODENOSCOPY (EGD) WITH PROPOFOL (N/A )     Patient location during evaluation: Endoscopy Anesthesia Type: MAC Level of consciousness: awake and alert Pain management: pain level controlled Vital Signs Assessment: post-procedure vital signs reviewed and stable Respiratory status: spontaneous breathing, nonlabored ventilation and respiratory function stable Cardiovascular status: blood pressure returned to baseline and stable Postop Assessment: no apparent nausea or vomiting Anesthetic complications: no    Last Vitals:  Vitals:   11/24/19 1353 11/24/19 1500  BP: 120/64 (!) 99/41  Pulse: (!) 115   Resp: 13 12  Temp: 36.6 C 36.9 C  SpO2: 96% 93%    Last Pain:  Vitals:   11/24/19 1500  TempSrc: Oral  PainSc:                  Lidia Collum

## 2019-11-24 NOTE — Progress Notes (Signed)
eLink Physician-Brief Progress Note Patient Name: Bradley Miles DOB: Mar 05, 1966 MRN: [id number]   Date of Service  11/24/2019  HPI/Events of Note  PT s/p TIPS and embolization of blood supply to gastric varices due to recurrent bleeding from varices.  eICU Interventions  Octreotide infusion, Protonix bid, serial H & H, Bedside crew to see patient in consultation.        [name] 11/24/2019, 10:54 PM

## 2019-11-24 NOTE — Interval H&P Note (Signed)
History and Physical Interval Note: 53/male with gastric varices, s/p BRTO, ongoing GI bleeding for an EGD with possible banding.  11/24/2019 2:22 PM  Bradley Miles  has presented today for EGD with possible banding, with the diagnosis of variceal bleeding, post BRTO.  The various methods of treatment have been discussed with the patient and family. After consideration of risks, benefits and other options for treatment, the patient has consented to  Procedure(s): ESOPHAGOGASTRODUODENOSCOPY (EGD) WITH PROPOFOL (N/A) as a surgical intervention.  The patient's history has been reviewed, patient examined, no change in status, stable for surgery.  I have reviewed the patient's chart and labs.  Questions were answered to the patient's satisfaction.     Ronnette Juniper

## 2019-11-24 NOTE — Anesthesia Procedure Notes (Signed)
Arterial Line Insertion Start/End12/15/2020 3:20 PM, 11/24/2019 3:25 PM Performed by: Bryson Corona, CRNA, CRNA  Patient location: Pre-op. Preanesthetic checklist: patient identified, IV checked, site marked, risks and benefits discussed, surgical consent, monitors and equipment checked, pre-op evaluation, timeout performed and anesthesia consent Lidocaine 1% used for infiltration Left, radial was placed Catheter size: 20 Fr Hand hygiene performed  and maximum sterile barriers used   Attempts: 1 Procedure performed without using ultrasound guided technique. Following insertion, dressing applied and Biopatch. Post procedure assessment: normal and unchanged  Patient tolerated the procedure well with no immediate complications.

## 2019-11-24 NOTE — Progress Notes (Signed)
CRITICAL VALUE ALERT  Critical Value:  Hgb 6.9  Date & Time Notied:  12/15  0320  Provider Notified: Alessandra Bevels, MD  Orders Received/Actions taken: Transfuse 1 unit PRBC & 1 unit Platelet

## 2019-11-24 NOTE — Anesthesia Preprocedure Evaluation (Signed)
Anesthesia Evaluation  Patient identified by MRN, date of birth, ID band Patient awake    Reviewed: Allergy & Precautions, NPO status , Patient's Chart, lab work & pertinent test results  Airway Mallampati: II  TM Distance: >3 FB Neck ROM: Full    Dental  (+) Teeth Intact, Dental Advisory Given   Pulmonary COPD, Current Smoker and Patient abstained from smoking.,    Pulmonary exam normal breath sounds clear to auscultation       Cardiovascular hypertension, Normal cardiovascular exam Rhythm:Regular Rate:Normal     Neuro/Psych  Headaches, PSYCHIATRIC DISORDERS Anxiety  Neuromuscular disease    GI/Hepatic GERD  Medicated,(+)     substance abuse  alcohol use, Hepatitis -  Endo/Other  negative endocrine ROSObesity   Renal/GU negative Renal ROS     Musculoskeletal negative musculoskeletal ROS (+)   Abdominal   Peds  Hematology  (+) Blood dyscrasia (Thrombocytopenia), anemia ,   Anesthesia Other Findings Day of surgery medications reviewed with the patient.  Reproductive/Obstetrics                             Anesthesia Physical Anesthesia Plan  ASA: IV and emergent  Anesthesia Plan: General   Post-op Pain Management:    Induction: Intravenous  PONV Risk Score and Plan: 1 and Treatment may vary due to age or medical condition, Ondansetron and Midazolam  Airway Management Planned: Oral ETT  Additional Equipment: Arterial line  Intra-op Plan:   Post-operative Plan: Extubation in OR  Informed Consent: I have reviewed the patients History and Physical, chart, labs and discussed the procedure including the risks, benefits and alternatives for the proposed anesthesia with the patient or authorized representative who has indicated his/her understanding and acceptance.     Dental advisory given  Plan Discussed with: CRNA  Anesthesia Plan Comments:         Anesthesia Quick  Evaluation

## 2019-11-24 NOTE — Procedures (Signed)
Interventional Radiology Procedure Note  Procedure: TIPS with embolization of gastric variceal supply  Anesthesia: General  Operators: Drs. Corrie Mckusick, Julian Reil  Complications: None  Estimated Blood Loss: 20 mL  Findings: TIPS established after creating intrahepatic access between right hepatic vein and right portal vein. Gore Viatorr prosthesis placed, 10 mm diameter (8 cm covered, 2 cm uncovered). Stent post-dilated to 10 mm.  Additional posterior, left and accessory gastric vein shunts catheterized and embolized supplying gastric varices and ultimately a large gastro-renal shunt.  5 trunks catheterized and embolized with embolization coils and Amplatz vascular plugs with successful occlusion.  Plan: PACU recovery followed by monitoring for signs of additional bleeding. Will plan to try to extubate in PACU, if possible.  Venetia Night. Kathlene Cote, M.D Pager:  [phone]

## 2019-11-24 NOTE — H&P (View-Only) (Signed)
Extensive discussion with patient's wife (who is a retired Marine scientist) at bedside, and with the patient.  Since yesterday afternoon, the patient has become clinically less stable, with evidence of recurrent bleeding.  He became hypotensive last night and, during the night, past several large bloody stools.  He just had a large melenic stool, probably several hundred mL's.  Through the night, the patient received 3 units of packed cells and 1 unit of platelets, and his hemoglobin this morning is essentially unchanged.  The good news is that, at the moment, the patient actually looks pretty good.  He is fully awake and alert in bed, in no distress.  Vital signs are fair, slightly tachy with heart rate around 119, and blood pressure soft at 103/51.  The skin is warm and well-perfused.  The patient is alert and appropriate.  There is no asterixis.  He does serial threes quickly and with only 1 error, whereas he did have difficulty with serial sevens.    Most recent hemoglobin from 2 hours ago is 6.8; a full CBC is pending in about 1 hour.  Impression: Recurrent upper GI bleeding despite octreotide, presumed gastric variceal origin although the patient's endoscopy in [place] did mention erosive esophagitis and esophageal varices.  Plan: Await input from interventional radiology.  I have discussed the patient's status with the physician assistant for interventional radiology, who will pass the information along to the attending radiologist, Dr. Earleen Newport, who fortuitously is the one who did the patient's BRTO 2 days ago.    I offered our availability to do endoscopic reevaluation if it is thought that would be helpful, although I do think there is a fairly low probability it would disclose information that would change management, or provide any option for therapeutic intervention.  In the meantime, I spent quite a bit of time discussing with the patient, and his wife, general principles of chronic liver  disease and the fact that, by the time a person has variceal bleeding like this, there is significant liver damage present.  Furthermore, with active alcohol abuse (most recently 1 week ago), he is not currently a candidate for a liver transplant.  The essential character of alcohol avoidance going forward, both for the health of the liver itself and to potentially allow candidacy for a liver transplant, was discussed.  I did mention that sometimes, even with alcohol avoidance, the liver disease will progress and be fatal.  Time at bedside and coordinating care was approximately 35 minutes.  Cleotis Nipper, M.D. Pager [phone] If no answer or after 5 PM call [phone]

## 2019-11-24 NOTE — Transfer of Care (Signed)
Immediate Anesthesia Transfer of Care Note  Patient: Bradley Miles  Procedure(s) Performed: IR WITH ANESTHESIA (N/A )  Patient Location: PACU  Anesthesia Type:General  Level of Consciousness: drowsy and patient cooperative  Airway & Oxygen Therapy: Patient Spontanous Breathing and non-rebreather face mask  Post-op Assessment: Report given to RN and Post -op Vital signs reviewed and stable  Post vital signs: Reviewed and stable  Last Vitals:  Vitals Value Taken Time  BP 112/75 11/24/19 2037  Temp    Pulse 115 11/24/19 2041  Resp 60 11/24/19 2041  SpO2 93 % 11/24/19 2041  Vitals shown include unvalidated device data.  Last Pain:  Vitals:   11/24/19 2030  TempSrc:   PainSc: (P) 0-No pain         Complications: No apparent anesthesia complications

## 2019-11-24 NOTE — Op Note (Signed)
EGD was performed for ongoing melena and hematochezia.  Patient known to have gastric varices status post BRTO.  Findings: Diminutive grade 1 varices found in lower third of esophagus. Red blood found in cardia and fundus.  Fresh blood and clots noted.  Despite suctioning and lavage, ongoing active bleeding noted, obscuring mucosal visualization. Portal hypertensive gastropathy Normal-appearing antrum, pylorus, duodenum.   Recommendations: Spoke with Dr. Earleen Newport from interventional radiology. They plan to take the patient for TI PS versus BATO. Will give 2 units PRBC transfusion, and 1 unit FFP and vitamin K 10 mg IV x1.  Continue IV octreotide and IV Protonix. Monitor hemoglobin, platelets and INR, and transfuse PRBC, platelet or FFP accordingly. Prognosis guarded.  Ronnette Juniper, MD

## 2019-11-24 NOTE — Sedation Documentation (Signed)
Under the care of anestheisia

## 2019-11-24 NOTE — Anesthesia Procedure Notes (Signed)
Procedure Name: MAC Date/Time: 11/24/2019 2:30 PM Performed by: Trinna Post., CRNA Pre-anesthesia Checklist: Patient identified, Emergency Drugs available, Suction available, Patient being monitored and Timeout performed Patient Re-evaluated:Patient Re-evaluated prior to induction Oxygen Delivery Method: Nasal cannula Preoxygenation: Pre-oxygenation with 100% oxygen Induction Type: IV induction Placement Confirmation: positive ETCO2

## 2019-11-24 NOTE — Progress Notes (Addendum)
IR.  History of alcohol abuse, cirrhosis, and portal hypertension who presented to [facility] ED with coffee ground emesis; EGD showed gastric varices and new esophageal varices with large volume hematemesis; transferred to [facility] and underwent BRTO in IR 11/22/2019; underwent repeat EGD by Dr. Therisa Doyne today revealing an active bleeding gastric varices. IR requested by Dr. Therisa Doyne for possible image-guided TIPS. Per Dr. Therisa Doyne, patient to receive 1 Unit FFP, 1 Unit Vitamin K, and 2 Units PRBCs prior to procedure.  Evaluated patient bedside in endoscopy. Patient laying in bed resting comfortably. BP 99/41 HR 105 RR 12 SpO2 95% on 3L [place].  Plan for image-guided TIPS today in IR. Procedure to be discussed between Dr. Earleen Newport and patient's wife, [name], via telephone.  Risks and benefits of TIPS, BRTO and/or additional variceal embolization were discussed with the patient and/or the patient's family including, but not limited to, infection, bleeding, damage to adjacent structures, worsening hepatic and/or cardiac function, non-target embolization and death. This interventional procedure involves the use of X-rays and because of the nature of the planned procedure, it is possible that we will have prolonged use of X-ray fluoroscopy. Potential radiation risks to you include (but are not limited to) the following: - A slightly elevated risk for cancer  several years later in life. This risk is typically less than 0.5% percent. This risk is low in comparison to the normal incidence of human cancer, which is 33% for women and 50% for men according to the [place]. - Radiation induced injury can include skin redness, resembling a rash, tissue breakdown / ulcers and hair loss (which can be temporary or permanent).  The likelihood of either of these occurring depends on the difficulty of the procedure and whether you are sensitive to radiation due to previous procedures, disease, or genetic conditions.  IF your  procedure requires a prolonged use of radiation, you will be notified and given written instructions for further action.  It is your responsibility to monitor the irradiated area for the 2 weeks following the procedure and to notify your physician if you are concerned that you have suffered a radiation induced injury.  All of the patient's questions were answered, patient is agreeable to proceed. Consent signed and in chart.   Bradley Graff Amadeo Coke, PA-C 11/24/2019, 3:06 PM

## 2019-11-24 NOTE — Progress Notes (Signed)
NAME:  Bradley Miles, MRN:  [id number], DOB:  07/10/1966, LOS: 4 ADMISSION DATE:  11/19/2019, CONSULTATION DATE: 11/21/2019 REFERRING MD: Dr. Tamela Gammon, CHIEF COMPLAINT: Esophageal varices  Brief History   53 y.o. M with PMH of ETOH abuse, COPD, cirrhosis and portal hypertension who presented to [facility] with coffee ground emesis on 12/10.  He was admitted and seen by GI, underwent EGD which showed new varices and erosive gastritis.  He was treated with octreotide and PPI. Had episode of large volume hematemesis with clots during admission and was transferred to the ICU, transferred to [facility] for IR consult.   History of present illness   [name] is a 53 y.o. M with PMH of heavy ETOH abuse, cirrhosis, HTN, and portal HTN who presented to the ED with coffee ground emesis and was found to have erosive gastritis and new oozing esophageal varices  S/p clipping x1.  Overnight 12/12, he developed large volume bright red hematemesis and was transferred to the ICU.  BP responded to volume resuscitation and given 1 unit PRBC's.  He has since had multiple episodes of maroon stool and so was transferred to [facility] ICU for CTA and IR consult.   ON admission had some episodes of hypotension, hb down to 7s.  C/o mild, diffuse abdominal pain and anxiety.   On 12/15 - ongoing melena, hematochezia, hypotension.  EGD done. Diminutive grade 1 varices found in lower third of esophagus. Red blood found in cardia and fundus.  Fresh blood and clots noted.  Despite suctioning and lavage, ongoing active bleeding noted, obscuring mucosal visualization.  2 units PRBC transfusion, and 1 unit FFP and vitamin K 10 mg IV x1. Continue IV octreotide and IV Protonix.  S/p TIPS, with embolization of gastric variceal supply   Past Medical History   has a past medical history of Anxiety, Enlarged liver, GERD (gastroesophageal reflux disease), Hypertension, Palpitations, PVC's (premature ventricular contractions),  Shortness of breath, and Varicose veins.  Significant Hospital Events   12/11 Admit to hospitalists at [facility] 12/12 Hematemesis, txfr to [facility] ICU then to [place]:  GI, IR, PCCM  Procedures:  12/11 upper endoscopy 12/13 BRTO  Significant Diagnostic Tests:  12/11 EGD>>severe erosive gastritis, new varices  Micro Data:  12/11 Sars-Cov-2>>negative  Antimicrobials:  Erythromycin 12/11 only Ceftriaxone 12/11-  Interim history/subjective:  1 PRBC overnight for Hgb 7.1, tachycardia No complaints this morning  Objective   Blood pressure 129/79, pulse (!) 117, temperature 98.7 F (37.1 C), resp. rate 19, height 6\' 1"  (1.854 m), weight 119 kg, SpO2 97 %.        Intake/Output Summary (Last 24 hours) at 11/24/2019 2331 Last data filed at 11/24/2019 2040 Gross per 24 hour  Intake 4860.86 ml  Output 1375 ml  Net 3485.86 ml   Filed Weights   11/19/19 2037 11/21/19 2000  Weight: 117.9 kg 119 kg    Examination: General: nad, awake, drowsy, oriented to self HENT: NCAT, R IJ CVC, pink mmm.   Lungs: CTA bilaterally,  Cardiovascular: Sinus tachycardia. s1s2 no rgm Cap refill < 3 seconds  Abdomen:, non-tender. + bowel sounds x4.  Extremities: Symmetrical bulk and tone, no cyanosis no clubbing  Neuro: AAOx4 following commands  Skin: pale, clean, dry, warm  GU: defer   Resolved Hospital Problem list     Assessment & Plan:   Esophageal varices GI bleed, significant bleeding again today.  -s/p BRTO 12/13 S/p TIPS and gastric embolization tonight  P -Cont Octreotide,  protonix Transfuse blood products as needed.   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 If febrile, favor external cooling measures   EtOH abuse P -CIWA -Cont thiamine and folate, mvi   Depression/anxiety -Paxil   Hypomagnesemia Hypocalcemia -especially in setting of blood product admin P -Replete Mag and Calcium -Trend Mag, ionized calcium  Best practice:  Diet: npo for tonight (still groggy after procedure)  Pain/Anxiety/Delirium protocol (if indicated): Ativan VAP protocol (if indicated): Not applicable DVT prophylaxis: SCD GI prophylaxis: Protonix, octreotide  Glucose control: SSI Mobility: full assist Code Status: Full code Family Communication: discussed with patient, wife at bedside  Disposition: ICU   Labs   CBC: Recent Labs  Lab 11/21/19 0457 11/21/19 1144 11/23/19 0829 11/23/19 2210 11/24/19 0134 11/24/19 0554 11/24/19 1022 11/24/19 1120  WBC 7.1 6.0 7.9  --   --   --  13.9* 14.4*  NEUTROABS  --   --   --   --   --   --  9.3* 9.7*  HGB 9.5* 9.4* 7.9* 7.0* 6.9* 6.8* 6.3* 6.3*  HCT 28.9* 28.5* 23.5* 20.8* 20.2* 20.2* 19.0* 18.5*  MCV 112.0* 111.3* 100.9*  --   --   --  97.9 97.4  PLT 86* 84* 77*  --   --   --  113* 117*    Basic Metabolic Panel: Recent Labs  Lab 11/19/19 2208 11/20/19 0456 11/21/19 0457 11/22/19 0712 11/23/19 0829 11/24/19 0554  NA 141  --  137 139 141 140  K 4.0  --  4.3 3.8 4.0 4.2  CL 99  --  99 108 109 110  CO2 29  --  29 20* 25 25  GLUCOSE 133*  --  147* 119* 121* 156*  BUN 11  --  14 18 15 15   CREATININE 0.71  --  1.07 0.94 0.90 1.12  CALCIUM 9.4  --  7.4* 6.6* 6.5* 6.6*  MG  --  1.2*  --  0.8*  --   --   PHOS  --  4.2  --  2.5  --   --    GFR: Estimated Creatinine Clearance: 105.4 mL/min (by C-G formula based on SCr of 1.12 mg/dL). Recent Labs  Lab 11/21/19 1144 11/23/19 0829 11/24/19 1022 11/24/19 1120  WBC 6.0 7.9 13.9* 14.4*    Liver Function Tests: Recent Labs  Lab 11/19/19 2208 11/22/19 0712 11/23/19 0829  AST 133* 71* 77*  ALT 39 20 25  ALKPHOS 254* 104 85  BILITOT 8.8* 5.8* 4.0*  PROT 8.3* 4.6* 4.2*  ALBUMIN 3.1* 1.7* 1.8*   No results for input(s): LIPASE, AMYLASE in the last 168 hours. No results for  input(s): AMMONIA in the last 168 hours.  ABG    Component Value Date/Time   TCO2 24 09/18/2013 0951     Coagulation Profile: Recent Labs  Lab 11/19/19 2208 11/22/19 2304  INR 1.3* 1.8*    Cardiac Enzymes: No results for input(s): CKTOTAL, CKMB, CKMBINDEX, TROPONINI in the last 168 hours.  HbA1C: Hgb A1c MFr Bld  Date/Time Value Ref Range Status  11/21/2019 04:57 AM 5.5 4.8 - 5.6 % Final    Comment:    (NOTE) Pre diabetes:          5.7%-6.4% Diabetes:              >  6.4% Glycemic control for   <7.0% adults with diabetes   06/01/2018 07:45 AM 5.5 4.8 - 5.6 % Final    Comment:    (NOTE) Pre diabetes:          5.7%-6.4% Diabetes:              >6.4% Glycemic control for   <7.0% adults with diabetes     CBG: Recent Labs  Lab 11/21/19 2023 11/22/19 0040  GLUCAP 120* 114*

## 2019-11-24 NOTE — Anesthesia Preprocedure Evaluation (Signed)
Anesthesia Evaluation  Patient identified by MRN, date of birth, ID band Patient awake    Reviewed: Allergy & Precautions, NPO status , Patient's Chart, lab work & pertinent test results  History of Anesthesia Complications Negative for: history of anesthetic complications  Airway Mallampati: II  TM Distance: >3 FB Neck ROM: Full    Dental   Pulmonary COPD, Current Smoker and Patient abstained from smoking.,    Pulmonary exam normal        Cardiovascular hypertension, + DVT  Normal cardiovascular exam     Neuro/Psych negative neurological ROS  negative psych ROS   GI/Hepatic GERD  ,(+) Cirrhosis   Esophageal Varices  substance abuse  alcohol use,   Endo/Other  negative endocrine ROS  Renal/GU negative Renal ROS  negative genitourinary   Musculoskeletal negative musculoskeletal ROS (+)   Abdominal   Peds  Hematology  (+) anemia ,   Anesthesia Other Findings   Reproductive/Obstetrics                            Anesthesia Physical Anesthesia Plan  ASA: IV and emergent  Anesthesia Plan: MAC   Post-op Pain Management:    Induction: Intravenous  PONV Risk Score and Plan: 1 and Propofol infusion, TIVA and Treatment may vary due to age or medical condition  Airway Management Planned: Natural Airway, Nasal Cannula and Simple Face Mask  Additional Equipment: None  Intra-op Plan:   Post-operative Plan:   Informed Consent: I have reviewed the patients History and Physical, chart, labs and discussed the procedure including the risks, benefits and alternatives for the proposed anesthesia with the patient or authorized representative who has indicated his/her understanding and acceptance.       Plan Discussed with:   Anesthesia Plan Comments:         Anesthesia Quick Evaluation

## 2019-11-24 NOTE — Anesthesia Procedure Notes (Signed)
Procedure Name: Intubation Date/Time: 11/24/2019 3:53 PM Performed by: Shirlyn Goltz, CRNA Pre-anesthesia Checklist: Patient identified, Emergency Drugs available, Suction available and Patient being monitored Patient Re-evaluated:Patient Re-evaluated prior to induction Oxygen Delivery Method: Circle system utilized Preoxygenation: Pre-oxygenation with 100% oxygen Induction Type: IV induction Ventilation: Mask ventilation without difficulty Laryngoscope Size: Mac Grade View: Grade I Tube type: Oral Tube size: 7.5 mm Number of attempts: 1 Airway Equipment and Method: Stylet Placement Confirmation: ETT inserted through vocal cords under direct vision,  positive ETCO2 and breath sounds checked- equal and bilateral Secured at: 25 cm Tube secured with: Tape Dental Injury: Teeth and Oropharynx as per pre-operative assessment

## 2019-11-24 NOTE — Transfer of Care (Signed)
Immediate Anesthesia Transfer of Care Note  Patient: Bradley Miles  Procedure(s) Performed: ESOPHAGOGASTRODUODENOSCOPY (EGD) WITH PROPOFOL (N/A )  Patient Location: PACU and Endoscopy Unit  Anesthesia Type:MAC  Level of Consciousness: drowsy  Airway & Oxygen Therapy: Patient Spontanous Breathing and Patient connected to nasal cannula oxygen  Post-op Assessment: Report given to RN and Post -op Vital signs reviewed and stable  Post vital signs: Reviewed and stable  Last Vitals:  Vitals Value Taken Time  BP    Temp    Pulse    Resp    SpO2      Last Pain:  Vitals:   11/24/19 1353  TempSrc: Oral  PainSc: 0-No pain         Complications: No apparent anesthesia complications

## 2019-11-24 NOTE — Op Note (Signed)
[facility] Patient Name: Bradley Miles Procedure Date : 11/24/2019 MRN: [id number] Attending MD: Ronnette Juniper , MD Date of Birth: 10-12-1966 CSN: [id number] Age: 53 Admit Type: Inpatient Procedure:                Upper GI endoscopy Indications:              Hematochezia, Melena, Follow-up of esophageal                            varices, s/p BRTO for gastric varices Providers:                Ronnette Juniper, MD, Carlyn Reichert, RN, Lina Sar,                            Paulo Fruit CRNA Referring MD:              Medicines:                Monitored Anesthesia Care Complications:            No immediate complications. Estimated Blood Loss:     Estimated blood loss: Large amount of active                            ongoing bleeding. Procedure:                Pre-Anesthesia Assessment:                           - Prior to the procedure, a History and Physical                            was performed, and patient medications and                            allergies were reviewed. The patient's tolerance of                            previous anesthesia was also reviewed. The risks                            and benefits of the procedure and the sedation                            options and risks were discussed with the patient.                            All questions were answered, and informed consent                            was obtained. Prior Anticoagulants: The patient has                            taken no previous anticoagulant or antiplatelet  agents. ASA Grade Assessment: IV - A patient with                            severe systemic disease that is a constant threat                            to life. After reviewing the risks and benefits,                            the patient was deemed in satisfactory condition to                            undergo the procedure.                           After obtaining informed  consent, the endoscope was                            passed under direct vision. Throughout the                            procedure, the patient's blood pressure, pulse, and                            oxygen saturations were monitored continuously. The                            GIF-H190 ([id number]) Olympus gastroscope was                            introduced through the mouth, and advanced to the                            second part of duodenum. The upper GI endoscopy was                            accomplished without difficulty. The patient                            tolerated the procedure well. Scope In: Scope Out: Findings:      Grade I varices were found in the lower third of the esophagus. They       were diminutive in size.      Red blood was found in the cardia, in the gastric fundus and in the       gastric body. Fresh blood and clots noted in cardia and fundus. Despite       suctioning and lavage, ongoing active bleeding was noted from the cardia       and fundus obscuring mucosal visualization.      Portal hypertensive gastropathy was found in the gastric body.      The gastric antrum and pylorus were normal.      The examined duodenum was normal. Impression:               - Grade I esophageal varices.                           -  Red blood in the cardia, in the gastric fundus                            and in the gastric body.                           - Portal hypertensive gastropathy.                           - Normal antrum and pylorus.                           - Normal examined duodenum.                           - No specimens collected. Moderate Sedation:      Patient did not receive moderate sedation for this procedure, but       instead received monitored anesthesia care. Recommendation:           - Refer to an interventional radiologist.                           - Spoke with Dr.Wagner from interventional                            radiology. Patient to be  taken for TIPS vs BATO now.                           - Transfuse 2 units PRBC now and keep 2 units PRBC                            in reserve.                           - Monitor HB,platelets and INR and transfuse                            PBRC,platelets or FFP/Vitamin K accordingly.                           - Will give 1 unit FFP and vitamin K 10 mg IV 1                            dose now. Procedure Code(s):        --- Professional ---                           [phone], Esophagogastroduodenoscopy, flexible,                            transoral; diagnostic, including collection of                            specimen(s) by brushing or washing, when performed                            (  separate procedure) Diagnosis Code(s):        --- Professional ---                           I85.00, Esophageal varices without bleeding                           K92.2, Gastrointestinal hemorrhage, unspecified                           K76.6, Portal hypertension                           K31.89, Other diseases of stomach and duodenum                           K92.1, Melena (includes Hematochezia) CPT copyright 2019 [employer]. All rights reserved. The codes documented in this report are preliminary and upon coder review may  be revised to meet current compliance requirements. Ronnette Juniper, MD 11/24/2019 2:55:06 PM This report has been signed electronically. Number of Addenda: 0

## 2019-11-24 NOTE — Progress Notes (Addendum)
Extensive discussion with patient's wife (who is a retired [employer] scientist) at bedside, and with the patient.  Since yesterday afternoon, the patient has become clinically less stable, with evidence of recurrent bleeding.  He became hypotensive last night and, during the night, past several large bloody stools.  He just had a large melenic stool, probably several hundred mL's.  Through the night, the patient received 3 units of packed cells and 1 unit of platelets, and his hemoglobin this morning is essentially unchanged.  The good news is that, at the moment, the patient actually looks pretty good.  He is fully awake and alert in bed, in no distress.  Vital signs are fair, slightly tachy with heart rate around 119, and blood pressure soft at 103/51.  The skin is warm and well-perfused.  The patient is alert and appropriate.  There is no asterixis.  He does serial threes quickly and with only 1 error, whereas he did have difficulty with serial sevens.    Most recent hemoglobin from 2 hours ago is 6.8; a full CBC is pending in about 1 hour.  Impression: Recurrent upper GI bleeding despite octreotide, presumed gastric variceal origin although the patient's endoscopy in [place] did mention erosive esophagitis and esophageal varices.  Plan: Await input from interventional radiology.  I have discussed the patient's status with the physician assistant for interventional radiology, who will pass the information along to the attending radiologist, Dr. Earleen Newport, who fortuitously is the one who did the patient's BRTO 2 days ago.    I offered our availability to do endoscopic reevaluation if it is thought that would be helpful, although I do think there is a fairly low probability it would disclose information that would change management, or provide any option for therapeutic intervention.  In the meantime, I spent quite a bit of time discussing with the patient, and his wife, general principles of chronic liver  disease and the fact that, by the time a person has variceal bleeding like this, there is significant liver damage present.  Furthermore, with active alcohol abuse (most recently 1 week ago), he is not currently a candidate for a liver transplant.  The essential character of alcohol avoidance going forward, both for the health of the liver itself and to potentially allow candidacy for a liver transplant, was discussed.  I did mention that sometimes, even with alcohol avoidance, the liver disease will progress and be fatal.  Time at bedside and coordinating care was approximately 35 minutes.  Cleotis Nipper, M.D. Pager [phone] If no answer or after 5 PM call [phone]

## 2019-11-25 LAB — POCT I-STAT 7, (LYTES, BLD GAS, ICA,H+H)
Acid-base deficit: 3 mmol/L — ABNORMAL HIGH (ref 0.0–2.0)
Acid-base deficit: 1 mmol/L (ref 0.0–2.0)
Bicarbonate: 23.7 mmol/L (ref 20.0–28.0)
Bicarbonate: 24.8 mmol/L (ref 20.0–28.0)
Calcium, Ion: 0.88 mmol/L — CL (ref 1.15–1.40)
Calcium, Ion: 0.97 mmol/L — ABNORMAL LOW (ref 1.15–1.40)
HCT: 24 % — ABNORMAL LOW (ref 39.0–52.0)
HCT: 23 % — ABNORMAL LOW (ref 39.0–52.0)
Hemoglobin: 8.2 g/dL — ABNORMAL LOW (ref 13.0–17.0)
Hemoglobin: 7.8 g/dL — ABNORMAL LOW (ref 13.0–17.0)
O2 Saturation: 99 %
O2 Saturation: 99 %
Patient temperature: 36.9
Potassium: 5.6 mmol/L — ABNORMAL HIGH (ref 3.5–5.1)
Potassium: 5.5 mmol/L — ABNORMAL HIGH (ref 3.5–5.1)
Sodium: 141 mmol/L (ref 135–145)
Sodium: 141 mmol/L (ref 135–145)
TCO2: 25 mmol/L (ref 22–32)
TCO2: 26 mmol/L (ref 22–32)
pCO2 arterial: 51.3 mmHg — ABNORMAL HIGH (ref 32.0–48.0)
pCO2 arterial: 46.8 mmHg (ref 32.0–48.0)
pH, Arterial: 7.272 — ABNORMAL LOW (ref 7.350–7.450)
pH, Arterial: 7.331 — ABNORMAL LOW (ref 7.350–7.450)
pO2, Arterial: 140 mmHg — ABNORMAL HIGH (ref 83.0–108.0)
pO2, Arterial: 151 mmHg — ABNORMAL HIGH (ref 83.0–108.0)

## 2019-11-25 LAB — TYPE AND SCREEN
ABO/RH(D): A NEG
Antibody Screen: NEGATIVE
Unit division: 0
Unit division: 0
Unit division: 0
Unit division: 0
Unit division: 0
Unit division: 0
Unit division: 0
Unit division: 0

## 2019-11-25 LAB — BPAM PLATELET PHERESIS
Blood Product Expiration Date: 202012152359
ISSUE DATE / TIME: 202012150541
Unit Type and Rh: 2800

## 2019-11-25 LAB — BPAM RBC
Blood Product Expiration Date: 202012192359
Blood Product Expiration Date: 202012192359
Blood Product Expiration Date: 202012202359
Blood Product Expiration Date: 202012202359
Blood Product Expiration Date: 202012202359
Blood Product Expiration Date: 202012222359
Blood Product Expiration Date: 202012242359
Blood Product Expiration Date: 202012292359
ISSUE DATE / TIME: 202012130205
ISSUE DATE / TIME: 202012130349
ISSUE DATE / TIME: 202012131549
ISSUE DATE / TIME: 202012131803
ISSUE DATE / TIME: 202012140344
ISSUE DATE / TIME: 202012141942
ISSUE DATE / TIME: 202012142142
ISSUE DATE / TIME: 202012150348
Unit Type and Rh: 600
Unit Type and Rh: 600
Unit Type and Rh: 600
Unit Type and Rh: 600
Unit Type and Rh: 600
Unit Type and Rh: 9500
Unit Type and Rh: 9500
Unit Type and Rh: 9500

## 2019-11-25 LAB — COMPREHENSIVE METABOLIC PANEL
ALT: 33 U/L (ref 0–44)
AST: 102 U/L — ABNORMAL HIGH (ref 15–41)
Albumin: 1.8 g/dL — ABNORMAL LOW (ref 3.5–5.0)
Alkaline Phosphatase: 67 U/L (ref 38–126)
Anion gap: 7 (ref 5–15)
BUN: 20 mg/dL (ref 6–20)
CO2: 25 mmol/L (ref 22–32)
Calcium: 6.7 mg/dL — ABNORMAL LOW (ref 8.9–10.3)
Chloride: 110 mmol/L (ref 98–111)
Creatinine, Ser: 1.15 mg/dL (ref 0.61–1.24)
GFR calc Af Amer: >60 mL/min (ref >60)
GFR calc non Af Amer: >60 mL/min (ref >60)
Glucose, Bld: 169 mg/dL — ABNORMAL HIGH (ref 70–99)
Potassium: 5 mmol/L (ref 3.5–5.1)
Sodium: 142 mmol/L (ref 135–145)
Total Bilirubin: 7.5 mg/dL — ABNORMAL HIGH (ref 0.3–1.2)
Total Protein: 4.1 g/dL — ABNORMAL LOW (ref 6.5–8.1)

## 2019-11-25 LAB — CBC
HCT: 24.9 % — ABNORMAL LOW (ref 39.0–52.0)
HCT: 23.6 % — ABNORMAL LOW (ref 39.0–52.0)
Hemoglobin: 8.4 g/dL — ABNORMAL LOW (ref 13.0–17.0)
Hemoglobin: 7.7 g/dL — ABNORMAL LOW (ref 13.0–17.0)
MCH: 32.8 pg (ref 26.0–34.0)
MCH: 32.6 pg (ref 26.0–34.0)
MCHC: 33.7 g/dL (ref 30.0–36.0)
MCHC: 32.6 g/dL (ref 30.0–36.0)
MCV: 97.3 fL (ref 80.0–100.0)
MCV: 100 fL (ref 80.0–100.0)
Platelets: 105 10*3/uL — ABNORMAL LOW (ref 150–400)
Platelets: 105 10*3/uL — ABNORMAL LOW (ref 150–400)
RBC: 2.56 MIL/uL — ABNORMAL LOW (ref 4.22–5.81)
RBC: 2.36 MIL/uL — ABNORMAL LOW (ref 4.22–5.81)
RDW: 20.5 % — ABNORMAL HIGH (ref 11.5–15.5)
RDW: 21.9 % — ABNORMAL HIGH (ref 11.5–15.5)
WBC: 18.3 10*3/uL — ABNORMAL HIGH (ref 4.0–10.5)
WBC: 13.4 10*3/uL — ABNORMAL HIGH (ref 4.0–10.5)
nRBC: 4.4 % — ABNORMAL HIGH (ref 0.0–0.2)
nRBC: 2.5 % — ABNORMAL HIGH (ref 0.0–0.2)

## 2019-11-25 LAB — PREPARE PLATELET PHERESIS: Unit division: 0

## 2019-11-25 LAB — HEMOGLOBIN AND HEMATOCRIT, BLOOD
HCT: 26.6 % — ABNORMAL LOW (ref 39.0–52.0)
HCT: 23.6 % — ABNORMAL LOW (ref 39.0–52.0)
HCT: 23 % — ABNORMAL LOW (ref 39.0–52.0)
Hemoglobin: 9.1 g/dL — ABNORMAL LOW (ref 13.0–17.0)
Hemoglobin: 8 g/dL — ABNORMAL LOW (ref 13.0–17.0)
Hemoglobin: 7.8 g/dL — ABNORMAL LOW (ref 13.0–17.0)

## 2019-11-25 LAB — MAGNESIUM: Magnesium: 1.4 mg/dL — ABNORMAL LOW (ref 1.7–2.4)

## 2019-11-25 LAB — GLUCOSE, CAPILLARY: Glucose-Capillary: 159 mg/dL — ABNORMAL HIGH (ref 70–99)

## 2019-11-25 LAB — PROTIME-INR
INR: 1.6 — ABNORMAL HIGH (ref 0.8–1.2)
Prothrombin Time: 19.1 seconds — ABNORMAL HIGH (ref 11.4–15.2)

## 2019-11-25 MED ORDER — FUROSEMIDE 10 MG/ML IJ SOLN
40.0000 mg | Freq: Once | INTRAMUSCULAR | Status: AC
  Administered 2019-11-25: 40 mg via INTRAVENOUS
  Filled 2019-11-25: qty 4

## 2019-11-25 MED ORDER — SODIUM CHLORIDE 0.9% IV SOLUTION: Freq: Once | INTRAVENOUS | Status: AC

## 2019-11-25 MED ORDER — CALCIUM GLUCONATE-NACL 1-0.675 GM/50ML-% IV SOLN
1.0000 g | Freq: Once | INTRAVENOUS | Status: AC
  Administered 2019-11-25: 1000 mg via INTRAVENOUS
  Filled 2019-11-25: qty 50

## 2019-11-25 MED ORDER — RIFAXIMIN 550 MG PO TABS
550.0000 mg | ORAL_TABLET | Freq: Two times a day (BID) | ORAL | Status: DC
  Administered 2019-11-25 – 2019-11-29 (×9): 550 mg via ORAL
  Filled 2019-11-25 (×10): qty 1

## 2019-11-25 MED ORDER — METOPROLOL TARTRATE 5 MG/5ML IV SOLN
2.5000 mg | Freq: Once | INTRAVENOUS | Status: AC
  Administered 2019-11-25: 2.5 mg via INTRAVENOUS

## 2019-11-25 MED ORDER — LACTULOSE 10 GM/15ML PO SOLN
10.0000 g | Freq: Two times a day (BID) | ORAL | Status: DC
  Administered 2019-11-25 – 2019-11-27 (×5): 10 g via ORAL
  Filled 2019-11-25 (×5): qty 15

## 2019-11-25 MED ORDER — VITAMIN K1 10 MG/ML IJ SOLN
10.0000 mg | Freq: Once | INTRAVENOUS | Status: AC
  Administered 2019-11-25: 10 mg via INTRAVENOUS
  Filled 2019-11-25: qty 1

## 2019-11-25 MED ORDER — METOPROLOL TARTRATE 5 MG/5ML IV SOLN
2.5000 mg | Freq: Four times a day (QID) | INTRAVENOUS | Status: DC | PRN
  Administered 2019-11-25: 2.5 mg via INTRAVENOUS
  Filled 2019-11-25 (×2): qty 5

## 2019-11-25 NOTE — Progress Notes (Signed)
eLink Physician-Brief Progress Note Patient Name: Bradley Miles DOB: 1965/12/21 MRN: [id number]   Date of Service  11/25/2019  HPI/Events of Note  Serum Calcium 6.7 gm, serum Albumin 1.8 mg/dl, corrected calcium 8.5 gm/dl  eICU Interventions  Calcium gluconate 1 gm iv bolus, check ionized calcium following repletion        Bradley Miles 11/25/2019, 5:20 AM

## 2019-11-25 NOTE — Progress Notes (Addendum)
Referring Physician(s): Edman Circle  Supervising Physician: Jacqulynn Cadet  Patient Status:  [facility] - In-pt  Chief Complaint: "Thirsty"  Subjective:  History of bleeding gastric varices s/p BRTO 11/22/2019 by Dr. Earleen Newport; with evidence of active bleeding from gastric varices on EGD 11/24/2019 s/p TIPS 11/24/2019 by Dr. Kathlene Cote and Dr. Earleen Newport. Patient laying in bed resting comfortably. He responds to voice and answers questions appropriately. A&O x3. States he is thirsty- asking for ice chips. Denies melena or abdominal pain. Right groin incision (femoral access) and RUQ incision (paracentesis access) sites c/d/i.   Allergies: Patient has no known allergies.  Medications: Prior to Admission medications   Medication Sig Start Date End Date Taking? Authorizing Provider  acebutolol (SECTRAL) 400 MG capsule Take 1 capsule (400 mg total) by mouth 2 (two) times daily. 10/13/19  Yes Herminio Commons, MD  allopurinol (ZYLOPRIM) 100 MG tablet TAKE 1 TABLET BY MOUTH EVERY DAY Patient taking differently: Take 100 mg by mouth.  07/13/19  Yes [name], MD  aspirin EC 81 MG tablet Take 81 mg by mouth daily.   Yes [provider]  cholecalciferol (VITAMIN D3) 25 MCG (1000 UT) tablet Take 1,000 Units by mouth daily.   Yes [provider]  DEXILANT 60 MG capsule TAKE 1 CAPSULE BY MOUTH EVERY DAY Patient taking differently: Take 60 mg by mouth daily.  06/18/19  Yes [name], MD  famotidine (PEPCID) 10 MG tablet Take 10 mg by mouth as needed for heartburn or indigestion.   Yes [provider]  folic acid (FOLVITE) 191 MCG tablet Take 400 mcg by mouth daily.   Yes [provider]  gabapentin (NEURONTIN) 300 MG capsule TAKE 1 CAPSULE BY MOUTH FOUR TIMES DAILY Patient taking differently: Take 800 mg by mouth at bedtime.  08/24/19  Yes [name], MD  lactulose, encephalopathy, (CHRONULAC) 10 GM/15ML SOLN Take 15-30cc  three times daily to Titrate for 3-4 soft bowel movements a day Patient taking differently: Take 15-30cc three times daily to Titrate for 3-4 soft bowel movements a day (sometimes forgets mid day dose) 08/04/19  Yes Mahala Menghini, PA-C  LORazepam (ATIVAN) 1 MG tablet Take 0.5 tablets (0.5 mg total) by mouth 2 (two) times daily as needed. for anxiety Patient taking differently: Take 1 mg by mouth 2 (two) times daily as needed for anxiety. for anxiety 07/29/19  Yes [name], MD  nortriptyline (PAMELOR) 50 MG capsule TAKE 1 CAPSULE(50 MG) BY MOUTH AT BEDTIME 03/23/19  Yes [name], MD  Omega-3 Fatty Acids (FISH OIL PO) Take 3 capsules by mouth as needed.    Yes [provider]  ondansetron (ZOFRAN-ODT) 4 MG disintegrating tablet DISSOLVE 1 TABLET ON THE TONGUE EVERY 8 HOURS AS NEEDED FOR NAUSEA 08/19/19  Yes Carlis Stable, NP  PARoxetine (PAXIL) 20 MG tablet TAKE 1 TABLET(20 MG) BY MOUTH DAILY 09/22/19  Yes [name], MD  sildenafil (VIAGRA) 100 MG tablet TAKE 1/2 TABLET BY MOUTH EVERY DAY 06/18/19  Yes [name], MD  tobramycin-dexamethasone [facility]) ophthalmic solution INSTILL 1 DROP INTO THE AFFECTED EYE TID FOR 5 DAYS 09/17/19  Yes [provider]  vitamin B-12 (CYANOCOBALAMIN) 1000 MCG tablet Take 1,000 mcg by mouth daily.   Yes [provider]     Vital Signs: BP 126/65   Pulse 97   Temp 97.8 F (36.6 C) (Oral)   Resp 14   Ht 6\' 1"  (1.854 m)  Wt 262 lb 5.6 oz (119 kg)   SpO2 93%   BMI 34.61 kg/m   Physical Exam Vitals and nursing note reviewed.  Constitutional:      General: He is not in acute distress.    Appearance: Normal appearance.  Pulmonary:     Effort: Pulmonary effort is normal. No respiratory distress.  Abdominal:     General: There is distension.     Palpations: Abdomen is soft.     Tenderness: There is no abdominal tenderness.     Comments: RUQ incision (paracentesis access) soft without active bleeding or  hematoma.  Skin:    General: Skin is warm and dry.     Comments: Right groin incision (femoral access) soft without active bleeding or hematoma.  Neurological:     Mental Status: He is oriented to person, place, and time.     Imaging: DG Chest 1 View  Result Date: 11/22/2019 CLINICAL DATA:  Central line placement EXAM: CHEST  1 VIEW COMPARISON:  06/01/2018 FINDINGS: Right neck vascular catheter is positioned with tip near the superior cavoatrial junction. Cardiomegaly. Mild, diffuse interstitial pulmonary opacity. IMPRESSION: 1. Right neck vascular catheter with tip near the superior cavoatrial junction. 2. Cardiomegaly with mild diffuse interstitial pulmonary opacity, which may reflect edema or infection. Electronically Signed   By: Eddie Candle M.D.   On: 11/22/2019 14:59   IR Angiogram Selective Each Additional Vessel  Result Date: 11/22/2019 CLINICAL DATA:  52 year old male with alcoholic cirrhosis and upper at GI hemorrhage, transferred from [facility] with gastric variceal hemorrhage, status post endoscopic identification and clipping. He has continued to have GI blood loss with need for pressor support, and presents now for urgent treatment with BRTO and/or TIPS EXAM: ULTRASOUND GUIDED ACCESS RIGHT COMMON FEMORAL VEIN LEFT RENAL VEIN ANGIOGRAM RETROGRADE SCLEROTHERAPY OF GASTRIC VARICES, ASSISTED WITH COIL EMBOLIZATION MEDICATIONS: As antibiotic prophylaxis, 2 G ANCEF was ordered pre-procedure and administered intravenously within one hour of incision. One unit of platelets was also administered IV during the procedure. ANESTHESIA/SEDATION: General - as administered by the Anesthesia department CONTRAST:  90 cc IV contrast FLUOROSCOPY TIME:  Fluoroscopy Time: 19 minutes 42 seconds (12/29/2041 mGy). COMPLICATIONS: None PROCEDURE: Informed written consent was obtained from the patient and the patient's family after a thorough discussion of the procedural risks, benefits and  alternatives. Specific risks that were addressed regarding BRTO include, bleeding, infection, contrast reaction, kidney injury, need for further procedure or surgery including TIPS and/ or endoscopy, pulmonary embolism, stroke, worsening of ascites, worsening of esophageal varices, paradoxical liver failure, varix rupture, cardiopulmonary collapse, death. All questions were addressed. Maximal Sterile Barrier Technique was utilized including caps, mask, sterile gowns, sterile gloves, sterile drape, hand hygiene and skin antiseptic. A timeout was performed prior to the initiation of the procedure. Ultrasound survey of the right inguinal region was performed with images stored and sent to PACs. A single wall puncture needle was used access the right common femoral artery under ultrasound guidance with saline syringe. With venous blood flow returned, a Bentson wire was observed to enter the common femoral vein and the IVC under fluoroscopy. The needle was removed, and a small incision was made, and 9 [place] dilator was passed over the Bentson wire. A 10 French TIPS sheath was then passed over the Bentson wire after manually creating a significant leftward current on the catheter. The inner dilator and wire were removed, and a 5 [place] cobra catheter was passed over the Bentson wire into the IVC. Cobra catheter  was used to select the left renal vein orifice. Combination of the Bentson wire, Glidewire, Rosen wire were used to gain purchased into the renal vein, with renal vein limited venogram performed to identify the in flowing gastro renal shunt. With a safety curve on a stiff Amplatz wire position into superior pole renal vein, the TIPS sheath was advanced into the left renal vein over the Cobra catheter. Pullback reinforced straight sheath selection technique (PRESSS) was performed, and as the tip sheath was withdrawn, contrast was used to identified the inflow of the gastro renal shunt. With the coaxial Amplatz wire  in position, a combination of a standard Kumpe catheter and a 4 French angled glide catheter were used to cannulate the orifice of the gastro renal shunt flowing shunt opposite the adrenal vein inflow. Once the Glidewire was advanced into the shunt, the catheter was advanced, and the Glidewire was exchanged for a stiff Amplatz wire with a safety curve on the tip. The renal vein buddy wire was withdrawn, and the TIPS sheath was advanced into the gastro renal shunt. As the 10 [place] TIPS sheath was clearly not long enough at 35 cm, a 45 cm 12 French sheath was placed. This sheath was then advanced into the most inferior aspects of the shunt orifice into the renal vein, though would not advance further given the length. Once the glide catheter and the Glidewire were in a reasonable location within the shunt, the catheter was withdrawn and then a balloon occlusion catheter was advanced over the Amplatz wire into the gastro renal shunt. Amplatz wire was then withdrawn. Angiogram was then performed to confirmed location within the gastro renal shunt outflow. Initial balloon inflation did not achieve stasis of the shunt, and the balloon was deflated with repositioning needed. There was difficulty passing the [place] balloon catheter into a more distal position of the shunt, likely secondary to tortuosity and/or venous web. Exchange for an 8 [place] Merci balloon, 95 cm, was performed on an Amplatz wire. A more distal position within the shunt was achieved. Balloon on the [facility] was inflated on the balloon occlusion catheter and was withdrawn to the first encountered web to achieve hemostasis. Angiogram confirmed stasis of flow. A CO2 angiogram was also performed, identifying the posterior gastric veins as the stopping point. A penumbra Lantern microcatheter and a fathom wire were navigated into the shunt. Once we confirmed location within the outflow with standard angiogram, treatment was initiated. A foamed  solution of detergent/sclerosant was mixed in a standard Tessari method, with 1:2:3 ratio of ethiodized oil: sodium tetradecyl sulfate: Room air. The sclerosed sent was infused under fluoroscopic guidance, with observation of reflux into the afferent veins. Once reflux was identified into the posterior gastric vein, sclerosing was completed. This required only approximately 15 cc of foam sclerotherapy in the above ratio. Once the posterior gastric veins were identified, we stop treatment. The micro catheter was withdrawn to the tip of the balloon occlusion catheter, and coil mass was deposited with deployment of multiple Ruby coils. This was performed with 24 by 60 cm coil, 28 x 60 cm coil, and 3 by 32 x 60 cm coil. The balloon was deflated under fluoroscopy, with no motion identified. The Merci balloon was slightly withdrawn into the more distal shunt (closer to the renal vein) and angiogram was performed confirming complete stasis of flow beyond the coil mass. Once we were confident there was no migration, balloon was withdrawn and the sheath was withdrawn. Hemostasis was achieved with  manual compression. Patient remained hemodynamically stable throughout. No complications were encountered and no significant blood loss was encountered. IMPRESSION: Status post ultrasound guided access right common femoral vein for coil-assisted BRTO (sclerotherapy) as treatment for life-threatening gastric variceal hemorrhage. Signed, Dulcy Fanny. Dellia Nims, [place] [facility] Radiology Plan PLAN: Observe in PACU, then returned ICU Continue current management including serial H and H Local wound care management at the common femoral vein access site Repeat CT angiogram abdomen/pelvis after 24 hours-48 hours on this admission Follow-up vascular Interventional Radiology Clinic in 4-8 weeks Electronically Signed   By: Corrie Mckusick D.O.   On: 11/22/2019 14:32   IR Venogram Renal Uni  Left  Result Date: 11/22/2019 CLINICAL DATA:  53 year old male with alcoholic cirrhosis and upper at GI hemorrhage, transferred from [facility] with gastric variceal hemorrhage, status post endoscopic identification and clipping. He has continued to have GI blood loss with need for pressor support, and presents now for urgent treatment with BRTO and/or TIPS EXAM: ULTRASOUND GUIDED ACCESS RIGHT COMMON FEMORAL VEIN LEFT RENAL VEIN ANGIOGRAM RETROGRADE SCLEROTHERAPY OF GASTRIC VARICES, ASSISTED WITH COIL EMBOLIZATION MEDICATIONS: As antibiotic prophylaxis, 2 G ANCEF was ordered pre-procedure and administered intravenously within one hour of incision. One unit of platelets was also administered IV during the procedure. ANESTHESIA/SEDATION: General - as administered by the Anesthesia department CONTRAST:  90 cc IV contrast FLUOROSCOPY TIME:  Fluoroscopy Time: 19 minutes 42 seconds (12/29/2041 mGy). COMPLICATIONS: None PROCEDURE: Informed written consent was obtained from the patient and the patient's family after a thorough discussion of the procedural risks, benefits and alternatives. Specific risks that were addressed regarding BRTO include, bleeding, infection, contrast reaction, kidney injury, need for further procedure or surgery including TIPS and/ or endoscopy, pulmonary embolism, stroke, worsening of ascites, worsening of esophageal varices, paradoxical liver failure, varix rupture, cardiopulmonary collapse, death. All questions were addressed. Maximal Sterile Barrier Technique was utilized including caps, mask, sterile gowns, sterile gloves, sterile drape, hand hygiene and skin antiseptic. A timeout was performed prior to the initiation of the procedure. Ultrasound survey of the right inguinal region was performed with images stored and sent to PACs. A single wall puncture needle was used access the right common femoral artery under ultrasound guidance with saline syringe. With venous blood flow  returned, a Bentson wire was observed to enter the common femoral vein and the IVC under fluoroscopy. The needle was removed, and a small incision was made, and 9 [place] dilator was passed over the Bentson wire. A 10 French TIPS sheath was then passed over the Bentson wire after manually creating a significant leftward current on the catheter. The inner dilator and wire were removed, and a 5 [place] cobra catheter was passed over the Bentson wire into the IVC. Cobra catheter was used to select the left renal vein orifice. Combination of the Bentson wire, Glidewire, Rosen wire were used to gain purchased into the renal vein, with renal vein limited venogram performed to identify the in flowing gastro renal shunt. With a safety curve on a stiff Amplatz wire position into superior pole renal vein, the TIPS sheath was advanced into the left renal vein over the Cobra catheter. Pullback reinforced straight sheath selection technique (PRESSS) was performed, and as the tip sheath was withdrawn, contrast was used to identified the inflow of the gastro renal shunt. With the coaxial Amplatz wire in position, a combination of a standard Kumpe catheter and a 4 French angled glide catheter were used to cannulate  the orifice of the gastro renal shunt flowing shunt opposite the adrenal vein inflow. Once the Glidewire was advanced into the shunt, the catheter was advanced, and the Glidewire was exchanged for a stiff Amplatz wire with a safety curve on the tip. The renal vein buddy wire was withdrawn, and the TIPS sheath was advanced into the gastro renal shunt. As the 10 [place] TIPS sheath was clearly not long enough at 35 cm, a 45 cm 12 French sheath was placed. This sheath was then advanced into the most inferior aspects of the shunt orifice into the renal vein, though would not advance further given the length. Once the glide catheter and the Glidewire were in a reasonable location within the shunt, the catheter was withdrawn and  then a balloon occlusion catheter was advanced over the Amplatz wire into the gastro renal shunt. Amplatz wire was then withdrawn. Angiogram was then performed to confirmed location within the gastro renal shunt outflow. Initial balloon inflation did not achieve stasis of the shunt, and the balloon was deflated with repositioning needed. There was difficulty passing the [place] balloon catheter into a more distal position of the shunt, likely secondary to tortuosity and/or venous web. Exchange for an 8 [place] Merci balloon, 95 cm, was performed on an Amplatz wire. A more distal position within the shunt was achieved. Balloon on the [facility] was inflated on the balloon occlusion catheter and was withdrawn to the first encountered web to achieve hemostasis. Angiogram confirmed stasis of flow. A CO2 angiogram was also performed, identifying the posterior gastric veins as the stopping point. A penumbra Lantern microcatheter and a fathom wire were navigated into the shunt. Once we confirmed location within the outflow with standard angiogram, treatment was initiated. A foamed solution of detergent/sclerosant was mixed in a standard Tessari method, with 1:2:3 ratio of ethiodized oil: sodium tetradecyl sulfate: Room air. The sclerosed sent was infused under fluoroscopic guidance, with observation of reflux into the afferent veins. Once reflux was identified into the posterior gastric vein, sclerosing was completed. This required only approximately 15 cc of foam sclerotherapy in the above ratio. Once the posterior gastric veins were identified, we stop treatment. The micro catheter was withdrawn to the tip of the balloon occlusion catheter, and coil mass was deposited with deployment of multiple Ruby coils. This was performed with 24 by 60 cm coil, 28 x 60 cm coil, and 3 by 32 x 60 cm coil. The balloon was deflated under fluoroscopy, with no motion identified. The Merci balloon was slightly withdrawn into the more  distal shunt (closer to the renal vein) and angiogram was performed confirming complete stasis of flow beyond the coil mass. Once we were confident there was no migration, balloon was withdrawn and the sheath was withdrawn. Hemostasis was achieved with manual compression. Patient remained hemodynamically stable throughout. No complications were encountered and no significant blood loss was encountered. IMPRESSION: Status post ultrasound guided access right common femoral vein for coil-assisted BRTO (sclerotherapy) as treatment for life-threatening gastric variceal hemorrhage. Signed, Dulcy Fanny. Dellia Nims, [place] [facility] Radiology Plan PLAN: Observe in PACU, then returned ICU Continue current management including serial H and H Local wound care management at the common femoral vein access site Repeat CT angiogram abdomen/pelvis after 24 hours-48 hours on this admission Follow-up vascular Interventional Radiology Clinic in 4-8 weeks Electronically Signed   By: Corrie Mckusick D.O.   On: 11/22/2019 14:32   IR US Guide Vasc Access Right  Result  Date: 11/22/2019 CLINICAL DATA:  53 year old male with alcoholic cirrhosis and upper at GI hemorrhage, transferred from [facility] with gastric variceal hemorrhage, status post endoscopic identification and clipping. He has continued to have GI blood loss with need for pressor support, and presents now for urgent treatment with BRTO and/or TIPS EXAM: ULTRASOUND GUIDED ACCESS RIGHT COMMON FEMORAL VEIN LEFT RENAL VEIN ANGIOGRAM RETROGRADE SCLEROTHERAPY OF GASTRIC VARICES, ASSISTED WITH COIL EMBOLIZATION MEDICATIONS: As antibiotic prophylaxis, 2 G ANCEF was ordered pre-procedure and administered intravenously within one hour of incision. One unit of platelets was also administered IV during the procedure. ANESTHESIA/SEDATION: General - as administered by the Anesthesia department CONTRAST:  90 cc IV contrast FLUOROSCOPY  TIME:  Fluoroscopy Time: 19 minutes 42 seconds (12/29/2041 mGy). COMPLICATIONS: None PROCEDURE: Informed written consent was obtained from the patient and the patient's family after a thorough discussion of the procedural risks, benefits and alternatives. Specific risks that were addressed regarding BRTO include, bleeding, infection, contrast reaction, kidney injury, need for further procedure or surgery including TIPS and/ or endoscopy, pulmonary embolism, stroke, worsening of ascites, worsening of esophageal varices, paradoxical liver failure, varix rupture, cardiopulmonary collapse, death. All questions were addressed. Maximal Sterile Barrier Technique was utilized including caps, mask, sterile gowns, sterile gloves, sterile drape, hand hygiene and skin antiseptic. A timeout was performed prior to the initiation of the procedure. Ultrasound survey of the right inguinal region was performed with images stored and sent to PACs. A single wall puncture needle was used access the right common femoral artery under ultrasound guidance with saline syringe. With venous blood flow returned, a Bentson wire was observed to enter the common femoral vein and the IVC under fluoroscopy. The needle was removed, and a small incision was made, and 9 [place] dilator was passed over the Bentson wire. A 10 French TIPS sheath was then passed over the Bentson wire after manually creating a significant leftward current on the catheter. The inner dilator and wire were removed, and a 5 [place] cobra catheter was passed over the Bentson wire into the IVC. Cobra catheter was used to select the left renal vein orifice. Combination of the Bentson wire, Glidewire, Rosen wire were used to gain purchased into the renal vein, with renal vein limited venogram performed to identify the in flowing gastro renal shunt. With a safety curve on a stiff Amplatz wire position into superior pole renal vein, the TIPS sheath was advanced into the left renal vein  over the Cobra catheter. Pullback reinforced straight sheath selection technique (PRESSS) was performed, and as the tip sheath was withdrawn, contrast was used to identified the inflow of the gastro renal shunt. With the coaxial Amplatz wire in position, a combination of a standard Kumpe catheter and a 4 French angled glide catheter were used to cannulate the orifice of the gastro renal shunt flowing shunt opposite the adrenal vein inflow. Once the Glidewire was advanced into the shunt, the catheter was advanced, and the Glidewire was exchanged for a stiff Amplatz wire with a safety curve on the tip. The renal vein buddy wire was withdrawn, and the TIPS sheath was advanced into the gastro renal shunt. As the 10 [place] TIPS sheath was clearly not long enough at 35 cm, a 45 cm 12 French sheath was placed. This sheath was then advanced into the most inferior aspects of the shunt orifice into the renal vein, though would not advance further given the length. Once the glide catheter and the Glidewire were in a reasonable location  within the shunt, the catheter was withdrawn and then a balloon occlusion catheter was advanced over the Amplatz wire into the gastro renal shunt. Amplatz wire was then withdrawn. Angiogram was then performed to confirmed location within the gastro renal shunt outflow. Initial balloon inflation did not achieve stasis of the shunt, and the balloon was deflated with repositioning needed. There was difficulty passing the [place] balloon catheter into a more distal position of the shunt, likely secondary to tortuosity and/or venous web. Exchange for an 8 [place] Merci balloon, 95 cm, was performed on an Amplatz wire. A more distal position within the shunt was achieved. Balloon on the [facility] was inflated on the balloon occlusion catheter and was withdrawn to the first encountered web to achieve hemostasis. Angiogram confirmed stasis of flow. A CO2 angiogram was also performed, identifying  the posterior gastric veins as the stopping point. A penumbra Lantern microcatheter and a fathom wire were navigated into the shunt. Once we confirmed location within the outflow with standard angiogram, treatment was initiated. A foamed solution of detergent/sclerosant was mixed in a standard Tessari method, with 1:2:3 ratio of ethiodized oil: sodium tetradecyl sulfate: Room air. The sclerosed sent was infused under fluoroscopic guidance, with observation of reflux into the afferent veins. Once reflux was identified into the posterior gastric vein, sclerosing was completed. This required only approximately 15 cc of foam sclerotherapy in the above ratio. Once the posterior gastric veins were identified, we stop treatment. The micro catheter was withdrawn to the tip of the balloon occlusion catheter, and coil mass was deposited with deployment of multiple Ruby coils. This was performed with 24 by 60 cm coil, 28 x 60 cm coil, and 3 by 32 x 60 cm coil. The balloon was deflated under fluoroscopy, with no motion identified. The Merci balloon was slightly withdrawn into the more distal shunt (closer to the renal vein) and angiogram was performed confirming complete stasis of flow beyond the coil mass. Once we were confident there was no migration, balloon was withdrawn and the sheath was withdrawn. Hemostasis was achieved with manual compression. Patient remained hemodynamically stable throughout. No complications were encountered and no significant blood loss was encountered. IMPRESSION: Status post ultrasound guided access right common femoral vein for coil-assisted BRTO (sclerotherapy) as treatment for life-threatening gastric variceal hemorrhage. Signed, Dulcy Fanny. Dellia Nims, [place] [facility] Radiology Plan PLAN: Observe in PACU, then returned ICU Continue current management including serial H and H Local wound care management at the common femoral vein access site  Repeat CT angiogram abdomen/pelvis after 24 hours-48 hours on this admission Follow-up vascular Interventional Radiology Clinic in 4-8 weeks Electronically Signed   By: Corrie Mckusick D.O.   On: 11/22/2019 14:32   DG Chest Port 1 View  Result Date: 11/24/2019 CLINICAL DATA:  Abnormal breath sounds EXAM: PORTABLE CHEST 1 VIEW COMPARISON:  Chest x-ray dated 11/22/2011 FINDINGS: The right-sided central venous catheter is stable in positioning. The heart size is enlarged. There are new small bilateral pleural effusions. There are new bibasilar airspace opacities favored to represent postoperative atelectasis. IMPRESSION: 1. New small bilateral pleural effusions and bibasilar airspace opacities favored to represent postoperative atelectasis. 2. Stable right central venous catheter. Electronically Signed   By: Constance Holster M.D.   On: 11/24/2019 21:39   IR EMBO ART  VEN HEMORR LYMPH EXTRAV  INC GUIDE ROADMAPPING  Result Date: 11/22/2019 CLINICAL DATA:  53 year old male with alcoholic cirrhosis and upper at GI hemorrhage, transferred from [facility] with gastric variceal hemorrhage, status post endoscopic identification and clipping. He has continued to have GI blood loss with need for pressor support, and presents now for urgent treatment with BRTO and/or TIPS EXAM: ULTRASOUND GUIDED ACCESS RIGHT COMMON FEMORAL VEIN LEFT RENAL VEIN ANGIOGRAM RETROGRADE SCLEROTHERAPY OF GASTRIC VARICES, ASSISTED WITH COIL EMBOLIZATION MEDICATIONS: As antibiotic prophylaxis, 2 G ANCEF was ordered pre-procedure and administered intravenously within one hour of incision. One unit of platelets was also administered IV during the procedure. ANESTHESIA/SEDATION: General - as administered by the Anesthesia department CONTRAST:  90 cc IV contrast FLUOROSCOPY TIME:  Fluoroscopy Time: 19 minutes 42 seconds (12/29/2041 mGy). COMPLICATIONS: None PROCEDURE: Informed written consent was obtained from the patient and the patient's  family after a thorough discussion of the procedural risks, benefits and alternatives. Specific risks that were addressed regarding BRTO include, bleeding, infection, contrast reaction, kidney injury, need for further procedure or surgery including TIPS and/ or endoscopy, pulmonary embolism, stroke, worsening of ascites, worsening of esophageal varices, paradoxical liver failure, varix rupture, cardiopulmonary collapse, death. All questions were addressed. Maximal Sterile Barrier Technique was utilized including caps, mask, sterile gowns, sterile gloves, sterile drape, hand hygiene and skin antiseptic. A timeout was performed prior to the initiation of the procedure. Ultrasound survey of the right inguinal region was performed with images stored and sent to PACs. A single wall puncture needle was used access the right common femoral artery under ultrasound guidance with saline syringe. With venous blood flow returned, a Bentson wire was observed to enter the common femoral vein and the IVC under fluoroscopy. The needle was removed, and a small incision was made, and 9 [place] dilator was passed over the Bentson wire. A 10 French TIPS sheath was then passed over the Bentson wire after manually creating a significant leftward current on the catheter. The inner dilator and wire were removed, and a 5 [place] cobra catheter was passed over the Bentson wire into the IVC. Cobra catheter was used to select the left renal vein orifice. Combination of the Bentson wire, Glidewire, Rosen wire were used to gain purchased into the renal vein, with renal vein limited venogram performed to identify the in flowing gastro renal shunt. With a safety curve on a stiff Amplatz wire position into superior pole renal vein, the TIPS sheath was advanced into the left renal vein over the Cobra catheter. Pullback reinforced straight sheath selection technique (PRESSS) was performed, and as the tip sheath was withdrawn, contrast was used to  identified the inflow of the gastro renal shunt. With the coaxial Amplatz wire in position, a combination of a standard Kumpe catheter and a 4 French angled glide catheter were used to cannulate the orifice of the gastro renal shunt flowing shunt opposite the adrenal vein inflow. Once the Glidewire was advanced into the shunt, the catheter was advanced, and the Glidewire was exchanged for a stiff Amplatz wire with a safety curve on the tip. The renal vein buddy wire was withdrawn, and the TIPS sheath was advanced into the gastro renal shunt. As the 10 [place] TIPS sheath was clearly not long enough at 35 cm, a 45 cm 12 French sheath was placed. This sheath was then advanced into the most inferior aspects of the shunt orifice into the renal vein, though would not advance further given the length. Once the glide catheter and the Glidewire were in a reasonable location within the shunt, the catheter was withdrawn and then a balloon occlusion catheter was advanced over the Amplatz  wire into the gastro renal shunt. Amplatz wire was then withdrawn. Angiogram was then performed to confirmed location within the gastro renal shunt outflow. Initial balloon inflation did not achieve stasis of the shunt, and the balloon was deflated with repositioning needed. There was difficulty passing the [place] balloon catheter into a more distal position of the shunt, likely secondary to tortuosity and/or venous web. Exchange for an 8 [place] Merci balloon, 95 cm, was performed on an Amplatz wire. A more distal position within the shunt was achieved. Balloon on the [facility] was inflated on the balloon occlusion catheter and was withdrawn to the first encountered web to achieve hemostasis. Angiogram confirmed stasis of flow. A CO2 angiogram was also performed, identifying the posterior gastric veins as the stopping point. A penumbra Lantern microcatheter and a fathom wire were navigated into the shunt. Once we confirmed location  within the outflow with standard angiogram, treatment was initiated. A foamed solution of detergent/sclerosant was mixed in a standard Tessari method, with 1:2:3 ratio of ethiodized oil: sodium tetradecyl sulfate: Room air. The sclerosed sent was infused under fluoroscopic guidance, with observation of reflux into the afferent veins. Once reflux was identified into the posterior gastric vein, sclerosing was completed. This required only approximately 15 cc of foam sclerotherapy in the above ratio. Once the posterior gastric veins were identified, we stop treatment. The micro catheter was withdrawn to the tip of the balloon occlusion catheter, and coil mass was deposited with deployment of multiple Ruby coils. This was performed with 24 by 60 cm coil, 28 x 60 cm coil, and 3 by 32 x 60 cm coil. The balloon was deflated under fluoroscopy, with no motion identified. The Merci balloon was slightly withdrawn into the more distal shunt (closer to the renal vein) and angiogram was performed confirming complete stasis of flow beyond the coil mass. Once we were confident there was no migration, balloon was withdrawn and the sheath was withdrawn. Hemostasis was achieved with manual compression. Patient remained hemodynamically stable throughout. No complications were encountered and no significant blood loss was encountered. IMPRESSION: Status post ultrasound guided access right common femoral vein for coil-assisted BRTO (sclerotherapy) as treatment for life-threatening gastric variceal hemorrhage. Signed, Dulcy Fanny. Dellia Nims, [place] [facility] Radiology Plan PLAN: Observe in PACU, then returned ICU Continue current management including serial H and H Local wound care management at the common femoral vein access site Repeat CT angiogram abdomen/pelvis after 24 hours-48 hours on this admission Follow-up vascular Interventional Radiology Clinic in 4-8 weeks Electronically Signed    By: Corrie Mckusick D.O.   On: 11/22/2019 14:32   CT Angio Abd/Pel w/ and/or w/o  Result Date: 11/24/2019 CLINICAL DATA:  Status post BRTO, GI bleeding EXAM: CTA ABDOMEN AND PELVIS WITH CONTRAST TECHNIQUE: Multidetector CT imaging of the abdomen and pelvis was performed using the standard protocol during bolus administration of intravenous contrast. Multiplanar reconstructed images and MIPs were obtained and reviewed to evaluate the vascular anatomy. CONTRAST:  168mL OMNIPAQUE IOHEXOL 350 MG/ML SOLN COMPARISON:  11/22/2019 FINDINGS: VASCULAR Aorta: Aorta atherosclerotic without significant aneurysm, dissection or occlusive process. No retroperitoneal hemorrhage or hematoma. Celiac: Main celiac origin is widely patent including its splenic and right hepatic branches. Separate origin of the left gastric and left hepatic artery also appearing patent. SMA: SMA origin is patent. Renals: Main renal arteries are widely patent. IMA: Small caliber IMA remains patent off the aorta. Inflow: Iliac atherosclerosis noted without significant inflow disease or occlusion. Proximal  Outflow: Atherosclerotic changes of the proximal outflow. The right common femoral and proximal profunda femoral artery are both patent. Right proximal SFA occlusion noted. Left common femoral, profunda femoral, proximal SFA visualized are all patent. Veins: Dedicated venous imaging not performed. Interval balloon retrograde trans obliteration of the gastric varices. Gastric varices appear largely occluded with radiopaque sclerotherapy and coils noted. Scattered areas non target radiopaque sclerotherapy within small branches of the short gastric veins about the spleen in the left upper quadrant. Overall limited assessment because venous phase imaging was not performed. Review of the MIP images confirms the above findings. NON-VASCULAR Lower chest: Increased dependent basilar atelectasis and very small left effusion. Normal heart size. No pericardial  effusion. Native coronary atherosclerosis. Hepatobiliary: Hepatic cirrhosis again evident with heterogeneous attenuation and micro nodularity to the surface. Limited assessment because of arterial phase imaging only. Small left hepatic dome subcentimeter probable cyst noted. Increased perihepatic ascites. Moderate gallbladder distension with layering gallstones again evident. No gross biliary obstruction or dilatation. Common bile duct nondilated. Pancreas: Unremarkable. No pancreatic ductal dilatation or surrounding inflammatory changes. Spleen: Stable mild splenomegaly.  Increased perisplenic ascites. Adrenals/Urinary Tract: Normal adrenal glands. No renal obstruction or hydronephrosis. No hydroureter or ureteral calculus. Bladder unremarkable. Stomach/Bowel: Negative for bowel obstruction, significant dilatation ileus or free air. Portions of the appendix are demonstrated and appear unremarkable. Increased stranding mesenteric edema. Exam of the bowel is limited but there is slight diffuse colonic bowel wall thickening suggesting mild colitis. New small amount abdominopelvic ascites. Lymphatic: No bulky adenopathy. Reproductive: No acute or significant finding by CT. Other: No inguinal or abdominal wall hernia. Increased mild diffuse lower body wall pelvic subcutaneous edema compatible with anasarca. Musculoskeletal: No acute osseous finding. Bilateral L5 pars defects noted. No anterolisthesis associated. Acute compression fracture. IMPRESSION: VASCULAR No evidence of significant CTA arterial GI bleeding. 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 Small amount of scattered non target radiopaque sclerotherapy within the perisplenic short gastric veins noted. Aortoiliac atherosclerosis without aneurysm or occlusive process Mesenteric and renal vasculature appear patent centrally. Chronic appearing proximal right SFA occlusion NON-VASCULAR Minor basilar atelectasis and trace left  effusion Hepatic cirrhosis with developing mesenteric edema, new small amount of abdominopelvic ascites, and body anasarca. Cholelithiasis Nonspecific mild colonic wall thickening. Difficult to exclude associated pancolitis. Electronically Signed   By: Jerilynn Mages.  Shick M.D.   On: 11/24/2019 13:09   CT Angio Abd/Pel w/ and/or w/o  Result Date: 11/22/2019 CLINICAL DATA:  GI bleed with large bloody bowel movement EXAM: CTA ABDOMEN AND PELVIS WITHOUT AND WITH CONTRAST TECHNIQUE: Multidetector CT imaging of the abdomen and pelvis was performed using the standard protocol during bolus administration of intravenous contrast. Multiplanar reconstructed images and MIPs were obtained and reviewed to evaluate the vascular anatomy. CONTRAST:  100 mL Omnipaque 350. COMPARISON:  06/02/2018 FINDINGS: VASCULAR Aorta: Abdominal aorta is well visualize without aneurysmal dilatation or focal dissection. Minimal atherosclerotic changes are noted. Celiac: Celiac axis is widely patent. The main celiac axis supplies the splenic artery as well as the right hepatic artery. Variant anatomy is noted with a smaller branch arising directly from the aorta just above the celiac axis supplying the left hepatic artery as well as left gastric artery. SMA: Patent without evidence of aneurysm, dissection, vasculitis or significant stenosis. Renals: Both renal arteries are patent without evidence of aneurysm, dissection, vasculitis, fibromuscular dysplasia or significant stenosis. IMA: Patent without evidence of aneurysm, dissection, vasculitis or significant stenosis. Iliacs: Atherosclerotic changes are noted without focal  stenosis Veins: IVC appears within normal limits. Multiple gastric varices are identified near the gastroesophageal junction and along the posterior aspect of the gastric fundus. The largest of these varices measures at least 1.8 cm in greatest dimension. No significant ascending esophageal varices are noted. The gastric varices seen  drain into the left renal vein. No other venous abnormality is noted. Portal vein is widely patent. Small coronary vein is seen. Review of the MIP images confirms the above findings. NON-VASCULAR Lower chest: No acute abnormality. Hepatobiliary: Liver is well visualized. Mild heterogeneity of the liver is seen without focal mass. Very mild nodularity is noted. The gallbladder is well distended with dependent gallstones. No complicating factors are seen. Mild perihepatic fluid is noted new from prior exam. Pancreas: Pancreas appears within normal limits. Spleen: Spleen shows no focal mass lesion. Splenic venous branches communicate with the gastric varices as well. Adrenals/Urinary Tract: The adrenal glands are within normal limits. Kidneys are well visualized bilaterally. No renal calculi or obstructive changes are seen. Delayed images demonstrate a normal enhancement pattern as well. The bladder is partially distended. Stomach/Bowel: Colon is well visualized. Scattered diverticular change is seen without evidence of diverticulitis. The appendix appears within normal limits. Small bowel is unremarkable. No area to suggest active hemorrhage in the small bowel or colon is seen. Stomach is well distended with water administered right before the exam. Some swirling of air and density is noted within the dependent portion of the stomach. This could be related to thrombus given the known gastric varices. Endoscopic clip is noted adjacent to region of varices posteriorly. Lymphatic: No significant lymphadenopathy is noted. Reproductive: Prostate is unremarkable. Other: No abdominal wall hernia or abnormality. Minimal perihepatic fluid is noted as previously mentioned. Musculoskeletal: Bilateral pars defects are noted at L5 with only minimal anterolisthesis. No acute bony abnormality is noted. IMPRESSION: VASCULAR Variant arterial anatomy of the celiac axis as described above. Changes consistent with gastric varices  intercommunicating between the splenic vein and left renal vein. The draining vein into the left renal vein measures approximately for 1.4 x 1.0 cm in diameter. Aortic Atherosclerosis (ICD10-I70.0). NON-VASCULAR Mild heterogeneity of the liver is noted with nodularity consistent with underlying cirrhosis. Diverticulosis without evidence of diverticulitis. Soft tissue density in the dependent portion of the stomach with air within. Given its proximity to the gastric varices along the posterior aspect of the gastric fundus this may represent thrombus. This area lies in an area of previous endoscopic clipping. No areas of active extravasation in the large or small bowel are noted. Electronically Signed   By: Inez Catalina M.D.   On: 11/22/2019 00:35    Labs:  CBC: Recent Labs    11/23/19 0829 11/24/19 1022 11/24/19 1120 11/24/19 1940 11/24/19 2000 11/25/19 0343 11/25/19 0800  WBC 7.9 13.9* 14.4*  --   --  18.3*  --   HGB 7.9* 6.3* 6.3* 7.8* 9.1* 8.4* 8.0*  HCT 23.5* 19.0* 18.5* 23.0* 26.6* 24.9* 23.6*  PLT 77* 113* 117*  --   --  105*  --     COAGS: Recent Labs    06/03/19 1546 08/19/19 1304 11/19/19 2208 11/22/19 2304  INR 1.2* 1.1 1.3* 1.8*  APTT  --   --  41*  --     BMP: Recent Labs    11/22/19 0712 11/23/19 0829 11/24/19 0554 11/24/19 1717 11/24/19 1940 11/25/19 0343  NA 139 141 140 141 141 142  K 3.8 4.0 4.2 5.6* 5.5* 5.0  CL 108 109  110  --   --  110  CO2 20* 25 25  --   --  25  GLUCOSE 119* 121* 156*  --   --  169*  BUN 18 15 15   --   --  20  CALCIUM 6.6* 6.5* 6.6*  --   --  6.7*  CREATININE 0.94 0.90 1.12  --   --  1.15  GFRNONAA >60 >60 >60  --   --  >60  GFRAA >60 >60 >60  --   --  >60    LIVER FUNCTION TESTS: Recent Labs    11/19/19 2208 11/22/19 0712 11/23/19 0829 11/25/19 0343  BILITOT 8.8* 5.8* 4.0* 7.5*  AST 133* 71* 77* 102*  ALT 39 20 25 33  ALKPHOS 254* 104 85 67  PROT 8.3* 4.6* 4.2* 4.1*  ALBUMIN 3.1* 1.7* 1.8* 1.8*    Assessment and  Plan:  History of bleeding gastric varices s/p BRTO 11/22/2019 by Dr. Earleen Newport; evidence of active bleeding from gastric varices on EGD 11/24/2019 s/p TIPS 11/24/2019 by Dr. Kathlene Cote, Dr. Earleen Newport, and Dr. Laurence Ferrari. A&Ox3. No evidence of GI bleeding. Hgb 8.0 this AM (down from 8.4 earlier this AM). Ammonia pending. Diet to be advanced per CCM- from IR standpoint ok to advance diet to clear liquids at this time. Plan to follow-up with IR at clinic 4 weeks after discharge- order placed to facilitate this. Further plans per CCM/GI- appreciate and agree with management. IR to follow.   Electronically Signed: Earley Abide, PA-C 11/25/2019, 11:46 AM   I spent a total of 25 Minutes at the the patient's bedside AND on the patient's hospital floor or unit, greater than 50% of which was counseling/coordinating care for gastric varices s/p BRTO and TIPS.

## 2019-11-25 NOTE — Progress Notes (Signed)
Subjective: Patient is drowsy, he received Ativan a few hours ago, however can be awakened easily and answers appropriately, is oriented to time, place and person. He had a small bowel movement overnight, denies abdominal pain. Is not on any IV pressors, continued on IV octreotide.  Objective: Vital signs in last 24 hours: Temp:  [97.2 F (36.2 C)-99 F (37.2 C)] 97.8 F (36.6 C) (12/16 0700) Pulse Rate:  [95-120] 97 (12/16 0900) Resp:  [11-67] 14 (12/16 0900) BP: (77-161)/(41-102) 126/65 (12/16 0600) SpO2:  [89 %-100 %] 93 % (12/16 0900) Arterial Line BP: (117-164)/(49-123) 118/70 (12/16 0900) Weight change:  Last BM Date: 11/24/19  PE: Deeply icteric GENERAL: Drowsy but follows commands, oriented x3 ABDOMEN: Distended abdomen, soft and nontender, sluggish hypoactive bowel sounds EXTREMITIES: No pedal edema  Lab Results: Results for orders placed or performed during the hospital encounter of 11/19/19 (from the past 48 hour(s))  Hemoglobin and hematocrit, blood     Status: Abnormal   Collection Time: 11/23/19 11:41 AM  Result Value Ref Range   Hemoglobin 7.9 (L) 13.0 - 17.0 g/dL   HCT 23.3 (L) 39.0 - 52.0 %    Comment: Performed at [facility], [address], [place], [place] [address]  Hemoglobin and hematocrit, blood     Status: Abnormal   Collection Time: 11/23/19  4:18 PM  Result Value Ref Range   Hemoglobin 8.1 (L) 13.0 - 17.0 g/dL   HCT 23.5 (L) 39.0 - 52.0 %    Comment: Performed at [facility] [address] [place], [place] [address]  H and Junita Push 12 Hrs     Status: Abnormal   Collection Time: 11/23/19  6:22 PM  Result Value Ref Range   Hemoglobin 6.7 (LL) 13.0 - 17.0 g/dL    Comment: REPEATED TO VERIFY THIS CRITICAL RESULT HAS VERIFIED AND BEEN CALLED TO ERICA BRADDOCK,RN BY ZELDA BEECH ON 12 14 2020 AT 1843, AND HAS BEEN READ BACK.     HCT 19.1 (L) 39.0 - 52.0 %    Comment: Performed at [facility], [place] [address] [place], [place]  [address]  Prepare RBC     Status: None   Collection Time: 11/23/19  6:35 PM  Result Value Ref Range   Order Confirmation      ORDER PROCESSED BY BLOOD BANK Performed at [facility], [place] [address] [place], [place] [address]   Hemoglobin and hematocrit, blood     Status: Abnormal   Collection Time: 11/23/19  8:09 PM  Result Value Ref Range   Hemoglobin 6.6 (LL) 13.0 - 17.0 g/dL    Comment: REPEATED TO VERIFY CRITICAL VALUE NOTED.  VALUE IS CONSISTENT WITH PREVIOUSLY REPORTED AND CALLED VALUE.    HCT 19.1 (L) 39.0 - 52.0 %    Comment: Performed at [facility], [place] [address] [place], [place] [address]  Prepare RBC     Status: None   Collection Time: 11/23/19  9:07 PM  Result Value Ref Range   Order Confirmation      ORDER PROCESSED BY BLOOD BANK Performed at [facility], [place] [address] [place], [place] [address]   Hemoglobin and hematocrit, blood     Status: Abnormal   Collection Time: 11/23/19 10:10 PM  Result Value Ref Range   Hemoglobin 7.0 (L) 13.0 - 17.0 g/dL   HCT 20.8 (L) 39.0 - 52.0 %    Comment: Performed at [facility], [address] [place], [place] [address]  Hemoglobin and hematocrit, blood     Status: Abnormal   Collection Time: 11/24/19  1:34 AM  Result Value Ref Range   Hemoglobin 6.9 (LL) 13.0 - 17.0 g/dL    Comment: REPEATED TO VERIFY THIS CRITICAL RESULT HAS VERIFIED AND BEEN CALLED TO SAMANTHA PAYNE RN. BY TAMEECO CALDWELL ON 12 15 2020 AT 0232, AND HAS BEEN READ BACK.     HCT 20.2 (L) 39.0 - 52.0 %    Comment: Performed at [facility], [place] [address] [place], [place] [address]  Prepare RBC     Status: None   Collection Time: 11/24/19  3:25 AM  Result Value Ref Range   Order Confirmation      ORDER PROCESSED BY BLOOD BANK Performed at [facility], [place] [address], [place], [place] [address]   Prepare Pheresed Platelets     Status: None   Collection Time: 11/24/19  3:33 AM  Result Value Ref Range   Unit  Number [id number]    Blood Component Type PLTP LR1 PAS    Unit division 00    Status of Unit ISSUED,FINAL    Transfusion Status      OK TO TRANSFUSE Performed at [facility] [address] [place], [place] [address]   Hemoglobin and hematocrit, blood     Status: Abnormal   Collection Time: 11/24/19  5:54 AM  Result Value Ref Range   Hemoglobin 6.8 (LL) 13.0 - 17.0 g/dL    Comment: REPEATED TO VERIFY CRITICAL VALUE NOTED.  VALUE IS CONSISTENT WITH PREVIOUSLY REPORTED AND CALLED VALUE.    HCT 20.2 (L) 39.0 - 52.0 %    Comment: Performed at [facility], [place] [address] [place], [place] [address]  Basic metabolic panel     Status: Abnormal   Collection Time: 11/24/19  5:54 AM  Result Value Ref Range   Sodium 140 135 - 145 mmol/L   Potassium 4.2 3.5 - 5.1 mmol/L   Chloride 110 98 - 111 mmol/L   CO2 25 22 - 32 mmol/L   Glucose, Bld 156 (H) 70 - 99 mg/dL   BUN 15 6 - 20 mg/dL   Creatinine, Ser 1.12 0.61 - 1.24 mg/dL   Calcium 6.6 (L) 8.9 - 10.3 mg/dL   GFR calc non Af Amer >60 >60 mL/min   GFR calc Af Amer >60 >60 mL/min   Anion gap 5 5 - 15    Comment: Performed at [facility] [address] [place], [place] [address]  CBC with Differential     Status: Abnormal   Collection Time: 11/24/19 10:22 AM  Result Value Ref Range   WBC 13.9 (H) 4.0 - 10.5 K/uL   RBC 1.94 (L) 4.22 - 5.81 MIL/uL   Hemoglobin 6.3 (LL) 13.0 - 17.0 g/dL    Comment: REPEATED TO VERIFY THIS CRITICAL RESULT HAS VERIFIED AND BEEN CALLED TO LINDA BASS RN BY ALLISON BENNETT ON 12 15 2020 AT 20, AND HAS BEEN READ BACK.     HCT 19.0 (L) 39.0 - 52.0 %   MCV 97.9 80.0 - 100.0 fL   MCH 32.5 26.0 - 34.0 pg   MCHC 33.2 30.0 - 36.0 g/dL   RDW 22.3 (H) 11.5 - 15.5 %   Platelets 113 (L) 150 - 400 K/uL    Comment: REPEATED TO VERIFY CONSISTENT WITH PREVIOUS RESULT    nRBC 2.1 (H) 0.0 - 0.2 %  Neutrophils Relative % 66 %   Neutro Abs 9.3 (H) 1.7 - 7.7 K/uL   Lymphocytes Relative 10 %    Lymphs Abs 1.3 0.7 - 4.0 K/uL   Monocytes Relative 18 %   Monocytes Absolute 2.5 (H) 0.1 - 1.0 K/uL   Eosinophils Relative 2 %   Eosinophils Absolute 0.3 0.0 - 0.5 K/uL   Basophils Relative 1 %   Basophils Absolute 0.1 0.0 - 0.1 K/uL   Immature Granulocytes 3 %   Abs Immature Granulocytes 0.37 (H) 0.00 - 0.07 K/uL   Polychromasia PRESENT     Comment: Performed at [facility] [address] [place], [place] [address]  CBC with Differential/Platelet     Status: Abnormal   Collection Time: 11/24/19 11:20 AM  Result Value Ref Range   WBC 14.4 (H) 4.0 - 10.5 K/uL   RBC 1.90 (L) 4.22 - 5.81 MIL/uL   Hemoglobin 6.3 (LL) 13.0 - 17.0 g/dL    Comment: REPEATED TO VERIFY CRITICAL VALUE NOTED.  VALUE IS CONSISTENT WITH PREVIOUSLY REPORTED AND CALLED VALUE.    HCT 18.5 (L) 39.0 - 52.0 %   MCV 97.4 80.0 - 100.0 fL   MCH 33.2 26.0 - 34.0 pg   MCHC 34.1 30.0 - 36.0 g/dL   RDW 22.4 (H) 11.5 - 15.5 %   Platelets 117 (L) 150 - 400 K/uL    Comment: REPEATED TO VERIFY CONSISTENT WITH PREVIOUS RESULT    nRBC 2.2 (H) 0.0 - 0.2 %   Neutrophils Relative % 67 %   Neutro Abs 9.7 (H) 1.7 - 7.7 K/uL   Lymphocytes Relative 9 %   Lymphs Abs 1.3 0.7 - 4.0 K/uL   Monocytes Relative 18 %   Monocytes Absolute 2.7 (H) 0.1 - 1.0 K/uL   Eosinophils Relative 2 %   Eosinophils Absolute 0.3 0.0 - 0.5 K/uL   Basophils Relative 1 %   Basophils Absolute 0.1 0.0 - 0.1 K/uL   Immature Granulocytes 3 %   Abs Immature Granulocytes 0.38 (H) 0.00 - 0.07 K/uL   Polychromasia PRESENT     Comment: Performed at [facility], [address] [place], [place] [address]  Type and screen [place]     Status: None (Preliminary result)   Collection Time: 11/24/19 11:20 AM  Result Value Ref Range   ABO/RH(D) A NEG    Antibody Screen NEG    Sample Expiration 11/27/2019,2359    Unit Number [id number]    Blood Component Type RED CELLS,LR    Unit division 00    Status of Unit ISSUED,FINAL     Transfusion Status OK TO TRANSFUSE    Crossmatch Result Compatible    Unit Number [id number]    Blood Component Type RED CELLS,LR    Unit division 00    Status of Unit ISSUED,FINAL    Transfusion Status OK TO TRANSFUSE    Crossmatch Result Compatible    Unit Number [id number]    Blood Component Type RED CELLS,LR    Unit division 00    Status of Unit ISSUED,FINAL    Transfusion Status OK TO TRANSFUSE    Crossmatch Result Compatible    Unit Number [id number]    Blood Component Type RBC LR PHER1    Unit division 00    Status of Unit ISSUED,FINAL    Transfusion Status OK TO TRANSFUSE    Crossmatch Result      Compatible Performed at [facility], [place]  [address] [place], [place] [address]    Unit Number [id number]    Blood Component Type RED CELLS,LR    Unit division 00    Status of Unit ALLOCATED    Transfusion Status OK TO TRANSFUSE    Crossmatch Result Compatible    Unit Number [id number]    Blood Component Type RED CELLS,LR    Unit division 00    Status of Unit ALLOCATED    Transfusion Status OK TO TRANSFUSE    Crossmatch Result Compatible    Unit Number [id number]    Blood Component Type RED CELLS,LR    Unit division 00    Status of Unit ALLOCATED    Transfusion Status OK TO TRANSFUSE    Crossmatch Result Compatible   Prepare RBC     Status: None   Collection Time: 11/24/19 11:20 AM  Result Value Ref Range   Order Confirmation      ORDER PROCESSED BY BLOOD BANK Performed at [facility], [place] [address], [place], [place] [address]   Prepare RBC     Status: None   Collection Time: 11/24/19  2:44 PM  Result Value Ref Range   Order Confirmation      ORDER PROCESSED BY BLOOD BANK Performed at [facility], [place] [address] [place], [place] [address]   Hemoglobin and hematocrit, blood     Status: Abnormal   Collection Time: 11/24/19  8:00 PM  Result Value Ref Range   Hemoglobin 9.1 (L) 13.0 - 17.0 g/dL    Comment: REPEATED TO  VERIFY POST TRANSFUSION SPECIMEN    HCT 26.6 (L) 39.0 - 52.0 %    Comment: Performed at [facility] [address] [place], [place] [address]  Prepare fresh frozen plasma     Status: None (Preliminary result)   Collection Time: 11/25/19 12:07 AM  Result Value Ref Range   Unit Number [id number]    Blood Component Type THAWED PLASMA    Unit division 00    Status of Unit ISSUED    Transfusion Status      OK TO TRANSFUSE Performed at [facility] [address] [place], [place] [address]   Prepare fresh frozen plasma     Status: None (Preliminary result)   Collection Time: 11/25/19  2:00 AM  Result Value Ref Range   Unit Number [id number]    Blood Component Type THAWED PLASMA    Unit division 00    Status of Unit ISSUED    Transfusion Status      OK TO TRANSFUSE Performed at [facility] [address] [place], [place] [address]   Glucose, capillary     Status: Abnormal   Collection Time: 11/25/19  3:36 AM  Result Value Ref Range   Glucose-Capillary 159 (H) 70 - 99 mg/dL  CBC     Status: Abnormal   Collection Time: 11/25/19  3:43 AM  Result Value Ref Range   WBC 18.3 (H) 4.0 - 10.5 K/uL   RBC 2.56 (L) 4.22 - 5.81 MIL/uL   Hemoglobin 8.4 (L) 13.0 - 17.0 g/dL   HCT 24.9 (L) 39.0 - 52.0 %   MCV 97.3 80.0 - 100.0 fL   MCH 32.8 26.0 - 34.0 pg   MCHC 33.7 30.0 - 36.0 g/dL   RDW 20.5 (H) 11.5 - 15.5 %   Platelets 105 (L) 150 - 400 K/uL    Comment: REPEATED TO VERIFY PLATELET COUNT CONFIRMED BY SMEAR SPECIMEN CHECKED FOR  CLOTS Immature Platelet Fraction may be clinically indicated, consider ordering this additional test [id number]    nRBC 4.4 (H) 0.0 - 0.2 %    Comment: Performed at [facility], [address] [place], [place] [address]  Magnesium     Status: Abnormal   Collection Time: 11/25/19  3:43 AM  Result Value Ref Range   Magnesium 1.4 (L) 1.7 - 2.4 mg/dL    Comment: Performed at [facility] [address],  [place], [place] [address]  Comprehensive metabolic panel     Status: Abnormal   Collection Time: 11/25/19  3:43 AM  Result Value Ref Range   Sodium 142 135 - 145 mmol/L   Potassium 5.0 3.5 - 5.1 mmol/L   Chloride 110 98 - 111 mmol/L   CO2 25 22 - 32 mmol/L   Glucose, Bld 169 (H) 70 - 99 mg/dL   BUN 20 6 - 20 mg/dL   Creatinine, Ser 1.15 0.61 - 1.24 mg/dL   Calcium 6.7 (L) 8.9 - 10.3 mg/dL   Total Protein 4.1 (L) 6.5 - 8.1 g/dL   Albumin 1.8 (L) 3.5 - 5.0 g/dL   AST 102 (H) 15 - 41 U/L   ALT 33 0 - 44 U/L   Alkaline Phosphatase 67 38 - 126 U/L   Total Bilirubin 7.5 (H) 0.3 - 1.2 mg/dL   GFR calc non Af Amer >60 >60 mL/min   GFR calc Af Amer >60 >60 mL/min   Anion gap 7 5 - 15    Comment: Performed at [facility] [address] [place], [place] [address]  Hemoglobin and hematocrit, blood     Status: Abnormal   Collection Time: 11/25/19  8:00 AM  Result Value Ref Range   Hemoglobin 8.0 (L) 13.0 - 17.0 g/dL   HCT 23.6 (L) 39.0 - 52.0 %    Comment: Performed at [facility], [place] [address], [place], [place] [address]    Studies/Results: DG Chest Port 1 View  Result Date: 11/24/2019 CLINICAL DATA:  Abnormal breath sounds EXAM: PORTABLE CHEST 1 VIEW COMPARISON:  Chest x-ray dated 11/22/2011 FINDINGS: The right-sided central venous catheter is stable in positioning. The heart size is enlarged. There are new small bilateral pleural effusions. There are new bibasilar airspace opacities favored to represent postoperative atelectasis. IMPRESSION: 1. New small bilateral pleural effusions and bibasilar airspace opacities favored to represent postoperative atelectasis. 2. Stable right central venous catheter. Electronically Signed   By: Constance Holster M.D.   On: 11/24/2019 21:39   CT Angio Abd/Pel w/ and/or w/o  Result Date: 11/24/2019 CLINICAL DATA:  Status post BRTO, GI bleeding EXAM: CTA ABDOMEN AND PELVIS WITH CONTRAST TECHNIQUE: Multidetector CT imaging of the abdomen and  pelvis was performed using the standard protocol during bolus administration of intravenous contrast. Multiplanar reconstructed images and MIPs were obtained and reviewed to evaluate the vascular anatomy. CONTRAST:  120mL OMNIPAQUE IOHEXOL 350 MG/ML SOLN COMPARISON:  11/22/2019 FINDINGS: VASCULAR Aorta: Aorta atherosclerotic without significant aneurysm, dissection or occlusive process. No retroperitoneal hemorrhage or hematoma. Celiac: Main celiac origin is widely patent including its splenic and right hepatic branches. Separate origin of the left gastric and left hepatic artery also appearing patent. SMA: SMA origin is patent. Renals: Main renal arteries are widely patent. IMA: Small caliber IMA remains patent off the aorta. Inflow: Iliac atherosclerosis noted without significant inflow disease or occlusion. Proximal Outflow: Atherosclerotic changes of the proximal outflow. The right common femoral and proximal  profunda femoral artery are both patent. Right proximal SFA occlusion noted. Left common femoral, profunda femoral, proximal SFA visualized are all patent. Veins: Dedicated venous imaging not performed. Interval balloon retrograde trans obliteration of the gastric varices. Gastric varices appear largely occluded with radiopaque sclerotherapy and coils noted. Scattered areas non target radiopaque sclerotherapy within small branches of the short gastric veins about the spleen in the left upper quadrant. Overall limited assessment because venous phase imaging was not performed. Review of the MIP images confirms the above findings. NON-VASCULAR Lower chest: Increased dependent basilar atelectasis and very small left effusion. Normal heart size. No pericardial effusion. Native coronary atherosclerosis. Hepatobiliary: Hepatic cirrhosis again evident with heterogeneous attenuation and micro nodularity to the surface. Limited assessment because of arterial phase imaging only. Small left hepatic dome subcentimeter  probable cyst noted. Increased perihepatic ascites. Moderate gallbladder distension with layering gallstones again evident. No gross biliary obstruction or dilatation. Common bile duct nondilated. Pancreas: Unremarkable. No pancreatic ductal dilatation or surrounding inflammatory changes. Spleen: Stable mild splenomegaly.  Increased perisplenic ascites. Adrenals/Urinary Tract: Normal adrenal glands. No renal obstruction or hydronephrosis. No hydroureter or ureteral calculus. Bladder unremarkable. Stomach/Bowel: Negative for bowel obstruction, significant dilatation ileus or free air. Portions of the appendix are demonstrated and appear unremarkable. Increased stranding mesenteric edema. Exam of the bowel is limited but there is slight diffuse colonic bowel wall thickening suggesting mild colitis. New small amount abdominopelvic ascites. Lymphatic: No bulky adenopathy. Reproductive: No acute or significant finding by CT. Other: No inguinal or abdominal wall hernia. Increased mild diffuse lower body wall pelvic subcutaneous edema compatible with anasarca. Musculoskeletal: No acute osseous finding. Bilateral L5 pars defects noted. No anterolisthesis associated. Acute compression fracture. IMPRESSION: VASCULAR No evidence of significant CTA arterial GI bleeding. 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 Small amount of scattered non target radiopaque sclerotherapy within the perisplenic short gastric veins noted. Aortoiliac atherosclerosis without aneurysm or occlusive process Mesenteric and renal vasculature appear patent centrally. Chronic appearing proximal right SFA occlusion NON-VASCULAR Minor basilar atelectasis and trace left effusion Hepatic cirrhosis with developing mesenteric edema, new small amount of abdominopelvic ascites, and body anasarca. Cholelithiasis Nonspecific mild colonic wall thickening. Difficult to exclude associated pancolitis. Electronically Signed   By:  Jerilynn Mages.  Shick M.D.   On: 11/24/2019 13:09    Medications: I have reviewed the patient's current medications.  Assessment: Gastric variceal bleed, status post BRTO, followed by TIPS yesterday after evidence of ongoing bleeding noted on EGD. So far has received 12 units of PRBC, 2 units of FFP and 1 unit of platelet  Hemoglobin stable between 8.4 and 8 Elevated T bili of 7.5, INR was 1.8/PT 20.6 on 11/22/2019, normal renal function  Plan: We will start lactulose 10 g / 15 mL twice a day along with Xifaxan 550 mg twice daily as patient remains at high risk for developing encephalopathy with underlying decompensated cirrhosis and recent TIPS with embolization of gastric variceal supply. Continue octreotide drip for at least another 24 hours. Diet to be advanced as per IR team, from GI standpoint can be started on clear liquid diet. Continue IV ceftriaxone. Will get an updated PT/INR for recalculation of MELDna. Patient appears hemodynamically stable without further evidence of GI bleeding.  Ronnette Juniper, MD 11/25/2019, 9:09 AM

## 2019-11-25 NOTE — Anesthesia Postprocedure Evaluation (Signed)
Anesthesia Post Note  Patient: Bradley Miles  Procedure(s) Performed: IR WITH ANESTHESIA (N/A )     Patient location during evaluation: PACU Anesthesia Type: General Level of consciousness: sedated, patient cooperative and responds to stimulation Pain management: pain level controlled Vital Signs Assessment: post-procedure vital signs reviewed and stable Respiratory status: spontaneous breathing, nonlabored ventilation, respiratory function stable and non-rebreather facemask Cardiovascular status: blood pressure returned to baseline and stable Postop Assessment: no apparent nausea or vomiting Anesthetic complications: no    Last Vitals:  Vitals:   11/24/19 2300 11/25/19 0015  BP:    Pulse: (!) 117   Resp: 19   Temp:  37.2 C  SpO2: (!) 89%     Last Pain:  Vitals:   11/25/19 0015  TempSrc: Axillary  PainSc:                  Bern Fare,W. EDMOND

## 2019-11-25 NOTE — Progress Notes (Signed)
[facility] Progress Note Patient Name: TYDUS SANMIGUEL DOB: [birth year] MRN: [id number]   Date of Service  11/25/2019  HPI/Events of Note  Pt blood pressure still high  eICU Interventions  Lopressor changed to 2.5-5 mg iv Q 6 hours and an extra dose of 2.5 mg given iv x 1.        Kerry Kass Corrado Hymon 11/25/2019, 2:24 AM

## 2019-11-25 NOTE — Progress Notes (Addendum)
[facility] Progress Note Patient Name: CHIOKE NOXON DOB: 1966-10-29 MRN: [id number]   Date of Service  11/25/2019  HPI/Events of Note  Hypertension not related to agitation or withdrawal  eICU Interventions  Lopressor 2.5 -5 mg iv Q 6 hours PRN SBP > 150 mmHg        Emonii Wienke U Mirren Gest 11/25/2019, 1:33 AM

## 2019-11-25 NOTE — Progress Notes (Signed)
NAME:  Bradley Miles, MRN:  [id number], DOB:  [birth year], LOS: 5 ADMISSION DATE:  11/19/2019, CONSULTATION DATE: 11/21/2019 REFERRING MD: Dr. Tamela Gammon, CHIEF COMPLAINT: Esophageal varices  Brief History   53 y.o. M with PMH of ETOH abuse, COPD, cirrhosis and portal hypertension who presented to [facility] with coffee ground emesis on 12/10.  He was admitted and seen by GI, underwent EGD which showed new varices and erosive gastritis.  He was treated with octreotide and PPI. Had episode of large volume hematemesis with clots during admission and was transferred to the ICU, transferred to [facility] for IR consult.   History of present illness   [name] is a 53 y.o. M with PMH of heavy ETOH abuse, cirrhosis, HTN, and portal HTN who presented to the ED with coffee ground emesis and was found to have erosive gastritis and new oozing esophageal varices  S/p clipping x1.  Overnight 12/12, he developed large volume bright red hematemesis and was transferred to the ICU.  BP responded to volume resuscitation and given 1 unit PRBC's.  He has since had multiple episodes of maroon stool and so was transferred to [facility] ICU for CTA and IR consult.   On admission had some episodes of hypotension, hb down to 70s.  C/o mild, diffuse abdominal pain and anxiety.   On 12/15 - ongoing melena, hematochezia, hypotension.  EGD done: Diminutive grade 1 varices found in lower third of esophagus. Red blood found in cardia and fundus.   Fresh blood and clots noted.  Despite suctioning and lavage, ongoing active bleeding noted, obscuring mucosal visualization.  S/p TIPS, with embolization of gastric variceal supply  Past Medical History  Anxiety, Enlarged liver, GERD (gastroesophageal reflux disease), Hypertension, Palpitations, PVC's (premature ventricular contractions), Shortness of breath, and Varicose veins.  Significant Hospital Events   12/11 Admit to hospitalists at [facility] 12/12 Hematemesis, txfr to [facility] ICU  then to [place]:  GI, IR, PCCM  Procedures:  12/11 upper endoscopy 12/13 BRTO 12/15 TIPS   Significant Diagnostic Tests:  12/11 EGD > severe erosive gastritis, new varices  Micro Data:  12/11 Sars-Cov-2 > negative  Antimicrobials:  Erythromycin 12/11 only Ceftriaxone 12/11 >   Interim history/subjective:  No acute events overnight, states he feels weak but 'better"   Objective   Blood pressure 126/65, pulse 97, temperature 97.8 F (36.6 C), temperature source Oral, resp. rate 14, height 6\' 1"  (1.854 m), weight 119 kg, SpO2 93 %.        Intake/Output Summary (Last 24 hours) at 11/25/2019 1151 Last data filed at 11/25/2019 1036 Gross per 24 hour  Intake 4219.78 ml  Output 1525 ml  Net 2694.78 ml   Filed Weights   11/19/19 2037 11/21/19 2000  Weight: 117.9 kg 119 kg    Examination: General: Chronically adult male, in NAD HEENT: [place]/AT, MM pink/moist, PERRL, sclera icteric  Neuro: Alert and oriented, able to follow all commands CV: s1s2 regular rate and rhythm, no murmur, rubs, or gallops,  PULM:  Clear to ascultation bilaterally, no increased work of breathing GI: soft, bowel sounds active in all 4 quadrants, non-tender, mildly distended  Extremities: warm/dry, generalized edema  Skin: no rashes or lesions  Resolved Hospital Problem list     Assessment & Plan:   Esophageal varices GI bleed -s/p BRTO 12/13  -S/p TIPS and gastric embolization 12/15 Plan  Continue prophylactic Ceftriaxone for 7 days, transition to po Cipro when able  Continue IV octreotide and Protonix  Clear liq diet Transfuse per protocol   Acute Blood Loss Anemia -IN the setting of acute GIB -Has received 11 units PRBC, 3 units FFP, 1 dose Vitamin K Plan  Trend cbc  Transfuse per protocol continuous telemetry   Cirrhosis  - 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 -Total bili significantly elevated Portal Hypertension  Plan GI following Trend CMP and  coags Ceftriaxone ads above   ETOH abuse Plan: CIWA scale q4 Continue thiamine, folate, and multivitamin   Depression/anxiety Plan Continue Paxil  Hypomagnesemia Hypocalcemia -Especially in setting of blood product admin Plan Replete as needed   Trend bmet   Best practice:  Diet: npo for tonight (still groggy after procedure)  Pain/Anxiety/Delirium protocol (if indicated): Ativan VAP protocol (if indicated): Not applicable DVT prophylaxis: SCD GI prophylaxis: Protonix, octreotide  Glucose control: SSI Mobility: full assist Code Status: Full code Family Communication: Discussed with patient, wife at bedside  Disposition: ICU   Labs   CBC: Recent Labs  Lab 11/21/19 1144 11/23/19 0829 11/24/19 1022 11/24/19 1120 11/24/19 1717 11/24/19 1940 11/24/19 2000 11/25/19 0343 11/25/19 0800  WBC 6.0 7.9 13.9* 14.4*  --   --   --  18.3*  --   NEUTROABS  --   --  9.3* 9.7*  --   --   --   --   --   HGB 9.4* 7.9* 6.3* 6.3* 8.2* 7.8* 9.1* 8.4* 8.0*  HCT 28.5* 23.5* 19.0* 18.5* 24.0* 23.0* 26.6* 24.9* 23.6*  MCV 111.3* 100.9* 97.9 97.4  --   --   --  97.3  --   PLT 84* 77* 113* 117*  --   --   --  105*  --     Basic Metabolic Panel: Recent Labs  Lab 11/20/19 0456 11/21/19 0457 11/22/19 0712 11/23/19 0829 11/24/19 0554 11/24/19 1717 11/24/19 1940 11/25/19 0343  NA  --  137 139 141 140 141 141 142  K  --  4.3 3.8 4.0 4.2 5.6* 5.5* 5.0  CL  --  99 108 109 110  --   --  110  CO2  --  29 20* 25 25  --   --  25  GLUCOSE  --  147* 119* 121* 156*  --   --  169*  BUN  --  14 18 15 15   --   --  20  CREATININE  --  1.07 0.94 0.90 1.12  --   --  1.15  CALCIUM  --  7.4* 6.6* 6.5* 6.6*  --   --  6.7*  MG 1.2*  --  0.8*  --   --   --   --  1.4*  PHOS 4.2  --  2.5  --   --   --   --   --    GFR: Estimated Creatinine Clearance: 102.7 mL/min (by C-G formula based on SCr of 1.15 mg/dL). Recent Labs  Lab 11/23/19 0829 11/24/19 1022 11/24/19 1120 11/25/19 0343  WBC 7.9  13.9* 14.4* 18.3*    Liver Function Tests: Recent Labs  Lab 11/19/19 2208 11/22/19 0712 11/23/19 0829 11/25/19 0343  AST 133* 71* 77* 102*  ALT 39 20 25 33  ALKPHOS 254* 104 85 67  BILITOT 8.8* 5.8* 4.0* 7.5*  PROT 8.3* 4.6* 4.2* 4.1*  ALBUMIN 3.1* 1.7* 1.8* 1.8*   No results for input(s): LIPASE, AMYLASE in the last 168 hours. No results for input(s): AMMONIA in the last 168 hours.  ABG    Component Value Date/Time  PHART 7.331 (L) 11/24/2019 1940   PCO2ART 46.8 11/24/2019 1940   PO2ART 151.0 (H) 11/24/2019 1940   HCO3 24.8 11/24/2019 1940   TCO2 26 11/24/2019 1940   ACIDBASEDEF 1.0 11/24/2019 1940   O2SAT 99.0 11/24/2019 1940     Coagulation Profile: Recent Labs  Lab 11/19/19 2208 11/22/19 2304  INR 1.3* 1.8*    Cardiac Enzymes: No results for input(s): CKTOTAL, CKMB, CKMBINDEX, TROPONINI in the last 168 hours.  HbA1C: Hgb A1c MFr Bld  Date/Time Value Ref Range Status  11/21/2019 04:57 AM 5.5 4.8 - 5.6 % Final    Comment:    (NOTE) Pre diabetes:          5.7%-6.4% Diabetes:              >6.4% Glycemic control for   <7.0% adults with diabetes   06/01/2018 07:45 AM 5.5 4.8 - 5.6 % Final    Comment:    (NOTE) Pre diabetes:          5.7%-6.4% Diabetes:              >6.4% Glycemic control for   <7.0% adults with diabetes     CBG: Recent Labs  Lab 11/21/19 2023 11/22/19 0040 11/25/19 0336  GLUCAP 120* 114* 159*     Critical care:   Performed by: Johnsie Cancel   Total critical care time: 30 minutes  Critical care time was exclusive of separately billable procedures and treating other patients.  Critical care was necessary to treat or prevent imminent or life-threatening deterioration.  Critical care was time spent personally by me on the following activities: development of treatment plan with patient and/or surrogate as well as nursing, discussions with consultants, evaluation of patient's response to treatment, examination of  patient, obtaining history from patient or surrogate, ordering and performing treatments and interventions, ordering and review of laboratory studies, ordering and review of radiographic studies, pulse oximetry and re-evaluation of patient's condition.  Johnsie Cancel, NP-C [facility] Contact / Pager information can be found on Amion  11/25/2019, 12:13 PM

## 2019-11-26 ENCOUNTER — Ambulatory Visit: Payer: Managed Care, Other (non HMO) | Admitting: Nurse Practitioner

## 2019-11-26 ENCOUNTER — Encounter (HOSPITAL_COMMUNITY): Payer: Self-pay

## 2019-11-26 LAB — COMPREHENSIVE METABOLIC PANEL WITH GFR
ALT: 77 U/L — ABNORMAL HIGH (ref 0–44)
AST: 208 U/L — ABNORMAL HIGH (ref 15–41)
Albumin: 1.8 g/dL — ABNORMAL LOW (ref 3.5–5.0)
Alkaline Phosphatase: 80 U/L (ref 38–126)
Anion gap: 4 — ABNORMAL LOW (ref 5–15)
BUN: 19 mg/dL (ref 6–20)
CO2: 29 mmol/L (ref 22–32)
Calcium: 7 mg/dL — ABNORMAL LOW (ref 8.9–10.3)
Chloride: 108 mmol/L (ref 98–111)
Creatinine, Ser: 0.83 mg/dL (ref 0.61–1.24)
GFR calc Af Amer: >60 mL/min
GFR calc non Af Amer: >60 mL/min
Glucose, Bld: 159 mg/dL — ABNORMAL HIGH (ref 70–99)
Potassium: 4 mmol/L (ref 3.5–5.1)
Sodium: 141 mmol/L (ref 135–145)
Total Bilirubin: 5.5 mg/dL — ABNORMAL HIGH (ref 0.3–1.2)
Total Protein: 4.1 g/dL — ABNORMAL LOW (ref 6.5–8.1)

## 2019-11-26 LAB — CBC
HCT: 23.8 % — ABNORMAL LOW (ref 39.0–52.0)
Hemoglobin: 7.9 g/dL — ABNORMAL LOW (ref 13.0–17.0)
MCH: 33.1 pg (ref 26.0–34.0)
MCHC: 33.2 g/dL (ref 30.0–36.0)
MCV: 99.6 fL (ref 80.0–100.0)
Platelets: 92 K/uL — ABNORMAL LOW (ref 150–400)
RBC: 2.39 MIL/uL — ABNORMAL LOW (ref 4.22–5.81)
RDW: 22.1 % — ABNORMAL HIGH (ref 11.5–15.5)
WBC: 10.3 K/uL (ref 4.0–10.5)
nRBC: 1.9 % — ABNORMAL HIGH (ref 0.0–0.2)

## 2019-11-26 LAB — PREPARE FRESH FROZEN PLASMA
Unit division: 0
Unit division: 0

## 2019-11-26 LAB — BPAM FFP
Blood Product Expiration Date: 202012202359
Blood Product Expiration Date: 202012202359
ISSUE DATE / TIME: 202012160027
ISSUE DATE / TIME: 202012160152
Unit Type and Rh: 6200
Unit Type and Rh: 6200

## 2019-11-26 LAB — PATHOLOGIST SMEAR REVIEW

## 2019-11-26 LAB — CALCIUM, IONIZED: Calcium, Ionized, Serum: 4 mg/dL — ABNORMAL LOW (ref 4.5–5.6)

## 2019-11-26 MED ORDER — PANTOPRAZOLE SODIUM 40 MG PO TBEC
40.0000 mg | DELAYED_RELEASE_TABLET | Freq: Two times a day (BID) | ORAL | Status: DC
  Administered 2019-11-26 – 2019-11-29 (×7): 40 mg via ORAL
  Filled 2019-11-26 (×7): qty 1

## 2019-11-26 MED ORDER — FOLIC ACID 1 MG PO TABS
1.0000 mg | ORAL_TABLET | Freq: Every day | ORAL | Status: DC
  Administered 2019-11-26 – 2019-11-29 (×4): 1 mg via ORAL
  Filled 2019-11-26 (×4): qty 1

## 2019-11-26 NOTE — Progress Notes (Signed)
Subjective: Patient was on bedside commode when seen in the room. Had a small volume of bloody bowel movement yesterday. Complains of abdominal distention, heartburn and acid reflux, increased gas.  Objective: Vital signs in last 24 hours: Temp:  [97 F (36.1 C)-98.1 F (36.7 C)] 98.1 F (36.7 C) (12/17 0754) Pulse Rate:  [92-113] 113 (12/17 1000) Resp:  [11-20] 13 (12/17 1000) BP: (101-149)/(47-88) 132/86 (12/17 0900) SpO2:  [87 %-97 %] 90 % (12/17 1000) Weight change:  Last BM Date: 11/26/19  PE: Prominent icterus, prominent pallor GENERAL: Not in distress, alert, oriented to time, place and person ABDOMEN: Appears distended EXTREMITIES: No deformity  Lab Results: Results for orders placed or performed during the hospital encounter of 11/19/19 (from the past 48 hour(s))  CBC with Differential/Platelet     Status: Abnormal   Collection Time: 11/24/19 11:20 AM  Result Value Ref Range   WBC 14.4 (H) 4.0 - 10.5 K/uL   RBC 1.90 (L) 4.22 - 5.81 MIL/uL   Hemoglobin 6.3 (LL) 13.0 - 17.0 g/dL    Comment: REPEATED TO VERIFY CRITICAL VALUE NOTED.  VALUE IS CONSISTENT WITH PREVIOUSLY REPORTED AND CALLED VALUE.    HCT 18.5 (L) 39.0 - 52.0 %   MCV 97.4 80.0 - 100.0 fL   MCH 33.2 26.0 - 34.0 pg   MCHC 34.1 30.0 - 36.0 g/dL   RDW 22.4 (H) 11.5 - 15.5 %   Platelets 117 (L) 150 - 400 K/uL    Comment: REPEATED TO VERIFY CONSISTENT WITH PREVIOUS RESULT    nRBC 2.2 (H) 0.0 - 0.2 %   Neutrophils Relative % 67 %   Neutro Abs 9.7 (H) 1.7 - 7.7 K/uL   Lymphocytes Relative 9 %   Lymphs Abs 1.3 0.7 - 4.0 K/uL   Monocytes Relative 18 %   Monocytes Absolute 2.7 (H) 0.1 - 1.0 K/uL   Eosinophils Relative 2 %   Eosinophils Absolute 0.3 0.0 - 0.5 K/uL   Basophils Relative 1 %   Basophils Absolute 0.1 0.0 - 0.1 K/uL   Immature Granulocytes 3 %   Abs Immature Granulocytes 0.38 (H) 0.00 - 0.07 K/uL   Polychromasia PRESENT     Comment: Performed at [facility], [address]  [place], [place] [address]  Type and screen [place]     Status: None (Preliminary result)   Collection Time: 11/24/19 11:20 AM  Result Value Ref Range   ABO/RH(D) A NEG    Antibody Screen NEG    Sample Expiration 11/27/2019,2359    Unit Number [id number]    Blood Component Type RED CELLS,LR    Unit division 00    Status of Unit ISSUED,FINAL    Transfusion Status OK TO TRANSFUSE    Crossmatch Result Compatible    Unit Number [id number]    Blood Component Type RED CELLS,LR    Unit division 00    Status of Unit ISSUED,FINAL    Transfusion Status OK TO TRANSFUSE    Crossmatch Result Compatible    Unit Number [id number]    Blood Component Type RED CELLS,LR    Unit division 00    Status of Unit ISSUED,FINAL    Transfusion Status OK TO TRANSFUSE    Crossmatch Result Compatible    Unit Number [id number]    Blood Component Type RBC LR PHER1    Unit division 00    Status of Unit ISSUED,FINAL    Transfusion Status OK TO TRANSFUSE    Crossmatch Result  Compatible Performed at [facility], [place] [address] [place], [place] [address]    Unit Number [id number]    Blood Component Type RED CELLS,LR    Unit division 00    Status of Unit ALLOCATED    Transfusion Status OK TO TRANSFUSE    Crossmatch Result Compatible    Unit Number [id number]    Blood Component Type RED CELLS,LR    Unit division 00    Status of Unit ALLOCATED    Transfusion Status OK TO TRANSFUSE    Crossmatch Result Compatible    Unit Number [id number]    Blood Component Type RED CELLS,LR    Unit division 00    Status of Unit ALLOCATED    Transfusion Status OK TO TRANSFUSE    Crossmatch Result Compatible   Prepare RBC     Status: None   Collection Time: 11/24/19 11:20 AM  Result Value Ref Range   Order Confirmation      ORDER PROCESSED BY BLOOD BANK Performed at [facility], [place] [address] [place], [place] [address]   Prepare RBC     Status: None    Collection Time: 11/24/19  2:44 PM  Result Value Ref Range   Order Confirmation      ORDER PROCESSED BY BLOOD BANK Performed at [facility], [place] [address], [place], [place] [address]   I-STAT 7, (LYTES, BLD GAS, ICA, H+H)     Status: Abnormal   Collection Time: 11/24/19  5:17 PM  Result Value Ref Range   pH, Arterial 7.272 (L) 7.350 - 7.450   pCO2 arterial 51.3 (H) 32.0 - 48.0 mmHg   pO2, Arterial 140.0 (H) 83.0 - 108.0 mmHg   Bicarbonate 23.7 20.0 - 28.0 mmol/L   TCO2 25 22 - 32 mmol/L   O2 Saturation 99.0 %   Acid-base deficit 3.0 (H) 0.0 - 2.0 mmol/L   Sodium 141 135 - 145 mmol/L   Potassium 5.6 (H) 3.5 - 5.1 mmol/L   Calcium, Ion 0.88 (LL) 1.15 - 1.40 mmol/L   HCT 24.0 (L) 39.0 - 52.0 %   Hemoglobin 8.2 (L) 13.0 - 17.0 g/dL   Patient temperature HIDE    Sample type ARTERIAL   I-STAT 7, (LYTES, BLD GAS, ICA, H+H)     Status: Abnormal   Collection Time: 11/24/19  7:40 PM  Result Value Ref Range   pH, Arterial 7.331 (L) 7.350 - 7.450   pCO2 arterial 46.8 32.0 - 48.0 mmHg   pO2, Arterial 151.0 (H) 83.0 - 108.0 mmHg   Bicarbonate 24.8 20.0 - 28.0 mmol/L   TCO2 26 22 - 32 mmol/L   O2 Saturation 99.0 %   Acid-base deficit 1.0 0.0 - 2.0 mmol/L   Sodium 141 135 - 145 mmol/L   Potassium 5.5 (H) 3.5 - 5.1 mmol/L   Calcium, Ion 0.97 (L) 1.15 - 1.40 mmol/L   HCT 23.0 (L) 39.0 - 52.0 %   Hemoglobin 7.8 (L) 13.0 - 17.0 g/dL   Patient temperature 36.9 C    Sample type ARTERIAL   Hemoglobin and hematocrit, blood     Status: Abnormal   Collection Time: 11/24/19  8:00 PM  Result Value Ref Range   Hemoglobin 9.1 (L) 13.0 - 17.0 g/dL    Comment: REPEATED TO VERIFY POST TRANSFUSION SPECIMEN    HCT 26.6 (L) 39.0 - 52.0 %    Comment: Performed at [facility], [place] [address], [place], [place] [address]  Prepare fresh  frozen plasma     Status: None   Collection Time: 11/25/19 12:07 AM  Result Value Ref Range   Unit Number [id number]    Blood Component Type THAWED  PLASMA    Unit division 00    Status of Unit ISSUED,FINAL    Transfusion Status      OK TO TRANSFUSE Performed at [facility] [address], [place], [place] [address]   Prepare fresh frozen plasma     Status: None   Collection Time: 11/25/19  2:00 AM  Result Value Ref Range   Unit Number [id number]    Blood Component Type THAWED PLASMA    Unit division 00    Status of Unit ISSUED,FINAL    Transfusion Status      OK TO TRANSFUSE Performed at [facility] [address] [place], [place] [address]   Glucose, capillary     Status: Abnormal   Collection Time: 11/25/19  3:36 AM  Result Value Ref Range   Glucose-Capillary 159 (H) 70 - 99 mg/dL  CBC     Status: Abnormal   Collection Time: 11/25/19  3:43 AM  Result Value Ref Range   WBC 18.3 (H) 4.0 - 10.5 K/uL   RBC 2.56 (L) 4.22 - 5.81 MIL/uL   Hemoglobin 8.4 (L) 13.0 - 17.0 g/dL   HCT 24.9 (L) 39.0 - 52.0 %   MCV 97.3 80.0 - 100.0 fL   MCH 32.8 26.0 - 34.0 pg   MCHC 33.7 30.0 - 36.0 g/dL   RDW 20.5 (H) 11.5 - 15.5 %   Platelets 105 (L) 150 - 400 K/uL    Comment: REPEATED TO VERIFY PLATELET COUNT CONFIRMED BY SMEAR SPECIMEN CHECKED FOR CLOTS Immature Platelet Fraction may be clinically indicated, consider ordering this additional test [id number]    nRBC 4.4 (H) 0.0 - 0.2 %    Comment: Performed at [facility], [place] [address] [place], [place] [address]  Magnesium     Status: Abnormal   Collection Time: 11/25/19  3:43 AM  Result Value Ref Range   Magnesium 1.4 (L) 1.7 - 2.4 mg/dL    Comment: Performed at [facility] [address] [place], [place] [address]  Comprehensive metabolic panel     Status: Abnormal   Collection Time: 11/25/19  3:43 AM  Result Value Ref Range   Sodium 142 135 - 145 mmol/L   Potassium 5.0 3.5 - 5.1 mmol/L   Chloride 110 98 - 111 mmol/L   CO2 25 22 - 32 mmol/L   Glucose, Bld 169 (H) 70 - 99 mg/dL   BUN 20 6 - 20 mg/dL   Creatinine, Ser 1.15 0.61 - 1.24 mg/dL    Calcium 6.7 (L) 8.9 - 10.3 mg/dL   Total Protein 4.1 (L) 6.5 - 8.1 g/dL   Albumin 1.8 (L) 3.5 - 5.0 g/dL   AST 102 (H) 15 - 41 U/L   ALT 33 0 - 44 U/L   Alkaline Phosphatase 67 38 - 126 U/L   Total Bilirubin 7.5 (H) 0.3 - 1.2 mg/dL   GFR calc non Af Amer >60 >60 mL/min   GFR calc Af Amer >60 >60 mL/min   Anion gap 7 5 - 15    Comment: Performed at [facility] [address], [place], [place] [address]  Pathologist smear review     Status: None   Collection Time: 11/25/19  3:43 AM  Result Value Ref Range  Path Review Macrocytosis      Comment: Mild thrombocytopenia Reviewed by Chrystie Nose. Saralyn Pilar, M.D. 11/25/2019 Performed at [facility], [place] [address], [place], [place] [address]   Calcium, ionized     Status: Abnormal   Collection Time: 11/25/19  6:29 AM  Result Value Ref Range   Calcium, Ionized, Serum 4.0 (L) 4.5 - 5.6 mg/dL    Comment: (NOTE) Performed At: [facility] [place], [place] [id number] Rush Farmer MD [id number]   Hemoglobin and hematocrit, blood     Status: Abnormal   Collection Time: 11/25/19  8:00 AM  Result Value Ref Range   Hemoglobin 8.0 (L) 13.0 - 17.0 g/dL   HCT 23.6 (L) 39.0 - 52.0 %    Comment: Performed at [facility], [place] [address], [place], [place] [address]  Hemoglobin and hematocrit, blood     Status: Abnormal   Collection Time: 11/25/19 12:00 PM  Result Value Ref Range   Hemoglobin 7.8 (L) 13.0 - 17.0 g/dL   HCT 23.0 (L) 39.0 - 52.0 %    Comment: Performed at [facility] [address], [place], [place] [address]  Protime-INR     Status: Abnormal   Collection Time: 11/25/19 12:00 PM  Result Value Ref Range   Prothrombin Time 19.1 (H) 11.4 - 15.2 seconds   INR 1.6 (H) 0.8 - 1.2    Comment: (NOTE) INR goal varies based on device and disease states. Performed at [facility], [place] [address] [place], [place] [address]   CBC     Status: Abnormal   Collection Time: 11/25/19   5:50 PM  Result Value Ref Range   WBC 13.4 (H) 4.0 - 10.5 K/uL   RBC 2.36 (L) 4.22 - 5.81 MIL/uL   Hemoglobin 7.7 (L) 13.0 - 17.0 g/dL   HCT 23.6 (L) 39.0 - 52.0 %   MCV 100.0 80.0 - 100.0 fL   MCH 32.6 26.0 - 34.0 pg   MCHC 32.6 30.0 - 36.0 g/dL   RDW 21.9 (H) 11.5 - 15.5 %   Platelets 105 (L) 150 - 400 K/uL    Comment: REPEATED TO VERIFY PLATELET COUNT CONFIRMED BY SMEAR Immature Platelet Fraction may be clinically indicated, consider ordering this additional test [id number]    nRBC 2.5 (H) 0.0 - 0.2 %    Comment: Performed at [facility], [place] [address] [place], [place] [address]  CBC     Status: Abnormal   Collection Time: 11/26/19  3:47 AM  Result Value Ref Range   WBC 10.3 4.0 - 10.5 K/uL   RBC 2.39 (L) 4.22 - 5.81 MIL/uL   Hemoglobin 7.9 (L) 13.0 - 17.0 g/dL   HCT 23.8 (L) 39.0 - 52.0 %   MCV 99.6 80.0 - 100.0 fL   MCH 33.1 26.0 - 34.0 pg   MCHC 33.2 30.0 - 36.0 g/dL   RDW 22.1 (H) 11.5 - 15.5 %   Platelets 92 (L) 150 - 400 K/uL    Comment: Immature Platelet Fraction may be clinically indicated, consider ordering this additional test [id number] CONSISTENT WITH PREVIOUS RESULT    nRBC 1.9 (H) 0.0 - 0.2 %    Comment: Performed at [facility], [place] [address] [place], [place] [address]  Comprehensive metabolic panel     Status: Abnormal   Collection Time: 11/26/19  3:47 AM  Result Value Ref Range   Sodium 141 135 - 145 mmol/L  Potassium 4.0 3.5 - 5.1 mmol/L   Chloride 108 98 - 111 mmol/L   CO2 29 22 - 32 mmol/L   Glucose, Bld 159 (H) 70 - 99 mg/dL   BUN 19 6 - 20 mg/dL   Creatinine, Ser 0.83 0.61 - 1.24 mg/dL   Calcium 7.0 (L) 8.9 - 10.3 mg/dL   Total Protein 4.1 (L) 6.5 - 8.1 g/dL   Albumin 1.8 (L) 3.5 - 5.0 g/dL   AST 208 (H) 15 - 41 U/L   ALT 77 (H) 0 - 44 U/L   Alkaline Phosphatase 80 38 - 126 U/L   Total Bilirubin 5.5 (H) 0.3 - 1.2 mg/dL   GFR calc non Af Amer >60 >60 mL/min   GFR calc Af Amer >60 >60 mL/min   Anion gap 4 (L) 5 - 15     Comment: Performed at [facility], [address], [place], [place] [address]    Studies/Results: IR Angiogram Selective Each Additional Vessel  Result Date: 11/25/2019 CLINICAL DATA:  53 year old male with a history of life threatening of bleeding secondary to portal hypertension and gastric varices. The patient is status post balloon occlusion retrograde trans venous obliteration (BRTO) on 11/22/2019, with recurrent hemorrhage confirmed by upper endoscopy EXAM: ULTRASOUND-GUIDED TRANS HEPATIC PORTAL VEIN ACCESS ULTRASOUND GUIDED ACCESS RIGHT JUGULAR VEIN. TIPS PLACEMENT COIL AND PLUG EMBOLIZATION OF MULTIPLE PORTAL VEIN TRIBUTARIES TO A PERSISTING ESOPHAGOGASTRIC VARICEAL NETWORK MEDICATIONS: As antibiotic prophylaxis, 2 G ANCEF was ordered pre-procedure and administered intravenously within one hour of incision. One unit of platelets was also administered IV during the procedure. ANESTHESIA/SEDATION: General - as administered by the Anesthesia department Vascular Interventional Radiology physicians: Dr. Earleen Newport, Dr. Kathlene Cote, Dr. Laurence Ferrari CONTRAST:  25m OMNIPAQUE IOHEXOL 300 MG/ML SOLN, 738mOMNIPAQUE IOHEXOL 300 MG/ML SOLN, 7021mMNIPAQUE IOHEXOL 300 MG/ML SOLN FLUOROSCOPY TIME:  Fluoroscopy Time: 66 minutes 36 seconds (6280 mGy). COMPLICATIONS: None PROCEDURE: Informed written consent was obtained from the patient's family after a thorough discussion of the procedural risks, benefits and alternatives. Specific risks discussed with TIPS/variceal embolization included: Bleeding, infection, vascular injury, need for further procedure/surgery, renal injury/renal failure, contrast reaction, non-target embolization, liver dysfunction/failure, hepatic encephalopathy, stroke (~1%), cardiopulmonary collapse, death. All questions were addressed. Maximal Sterile Barrier Technique was utilized including caps, mask, sterile gowns, sterile gloves, sterile drape, hand hygiene and skin antiseptic. A timeout was  performed prior to the initiation of the procedure. Patient was positioned supine position on the table, with the right upper quadrant and the right neck prepped and draped in the usual sterile fashion. Ultrasound survey of the right upper quadrant was performed, with images stored and sent to PACs. Using ultrasound guidance, Chiba needle was used access the right portal system. Once we confirmed position in the portal system with portal venography, micro wire was advanced into the inferior mesenteric vein. The inner dilator was advanced over the metal stiffener into the portal system, with the inner dilator advanced over the wire. Venogram confirmed are location within the portal system. The micro wire was then replaced into the inner dilator, with the transition point position at the apex of the catheter. Check flow vial was placed on the back and of the inner dilator. Ultrasound survey was then performed of the right neck, with images stored and sent to PACs. Ultrasound guidance was then used to access the right internal jugular vein with a micro puncture kit. The wire was advanced under fluoroscopy into the right atrium, and a small incision was made. The  needle was removed and the dilator was placed. The micro wire in the stiffener were removed and an 035 wire was then passed into the inferior vena cava. Ten French soft tissue dilation was performed on the wire, and then 10 [place] TIPS sheath was placed. Combination of Benson wire and multipurpose angled catheter were used to select the right hepatic vein. During the selection the right hepatic vein, accessory right hepatic vein was accessed with venography. Venogram confirmed location within the right hepatic vein. Tip sheath was then advanced over the catheter with a coaxial Amplatz wire, for a position cephalad to the transition point of the wire in the portal venous system. The Colapinto needle was then advanced through the TIPS sheath housed in the Teflon  sheath over the Amplatz wire. Using oblique projections, only a single pass was required to access the portal venous system. Of note, the liver was very dense, with difficulty penetrating the portal vein wall with the colapinto needle. Access into the portal venous system was confirmed by aspirating portal venous blood and a venogram. A stiff Glidewire was then used to navigate through the portal vein, ultimately with purchase in the IMV. Needle was removed. The stock 5 French glide catheter was attempted to pass over the Glidewire into the portal system. The tip would not penetrate the portal vein wall. A 4 French straight glide cath was then advanced on the Glidewire, and with forward tension, ultimately the glide cath penetrated the portal vein wall, with good purchase into the portal vein. Venogram confirmed location. Amplatz wire was then passed into the splenic vein through the Glidewire. The next step of dilating the soft tissue tract with series of balloons was quite difficult given the density of the liver. Initially a 4 mm by 40 mm balloon was successful passing into the portal vein with dilation of the soft tissue tract. Even after 4 mm balloon angioplasty, the sheath would not advanced over the Amplatz wire and balloon into the portal system, encountering resistance at the wall of the portal vein. 5 mm balloon angioplasty was then performed, with a similar resistance to passing the sheath into the portal vein. We attempted to pass the sheath with the coaxial dilator/introducer which was unsuccessful. 6 mm balloon angioplasty along the length of the tissue tract was ultimately successful, only after several reposition of the Amplatz wire into the distal splenic vein with the use of the glide catheter. Once the tip sheath was in the portal system, we confirmed location with angiogram. We selected a 10 mm x 2 x 8 viator stent, which was deployed and post dilated to 10 mm. Venogram confirm excellent flow  through the TIPS shunt. Pigtail catheter was then advanced into the splenic vein with repeat portal venogram. This demonstrated significant perfusion of the gastroesophageal network via left gastric, posterior gastric, and accessory posterior gastric contribution. We proceeded with embolization. Angled multipurpose catheter was used to select the largest of the posterior gastric veins. Coil embolization then proceeded with fibered 035 interlock coils, using multiple 10 mm diameter, 8 mm diameter, and 6 mm diameter coils. Once the flow was static in the first posterior gastric vein contribution, the catheter was withdrawn and repeat venogram was performed demonstrating accessory posterior gastric vein. This vein was selected with the 035 multipurpose catheter and coil embolized with fibered interlock coils, as well as a Amplatzer 4 plug. Repeat splenic vein/portal vein venogram demonstrated occlusion of enlarged short gastric vein after the sclerotherapy treatment, with persisting left gastric  vein contribution to the gastroesophageal network. The multipurpose catheter was then used to select these left gastric vein contributors, which were then coil embolized/plug embolized with a combination of fibrin coils and Amplatzer 4. Final angiogram was performed. The access in the right portal system was removed. Access in the right internal jugular vein was removed with hemostasis achieved. Sterile bandage was placed. Patient tolerated the procedure well and remained hemodynamically stable throughout. No complications were encountered and no significant blood loss. FINDINGS: Ultrasound guided access right internal jugular vein demonstrates wide patency. Creation of the portal systemic shunt demonstrated a significantly dense liver parenchyma, of which even the colapinto needle had difficulty crossing. Excellent flow through the TIPS shunt after creation, with 10 mm by 2 x 8 stent placed, dilated to 10 mm. Multiple portal  contribution identified from the portal and splenic vein including posterior gastric, accessory posterior gastric, left gastric, and accessory left gastric venous inflow into a persisting gastroesophageal varices network. Each of these was coil embolized/plug embolized to stasis with no persisting flow into the network upon completion. The preferential portal venous contribution that was treated during the balloon occlusion retrograde trans venous ablation on Sunday was a large short gastric vein which remains occluded on today's study. Persisting changes of prior sclerotherapy in the gastric variceal system. IMPRESSION: Status post TIPS creation and treatment of persisting portal contribution to gastroesophageal variceal network by combination of coil and plug embolization to stasis. Signed, Dulcy Fanny. Dellia Nims, RPVI [facility] Radiology Electronically Signed   By: Corrie Mckusick D.O.   On: 11/25/2019 13:21   IR Angiogram Selective Each Additional Vessel  Result Date: 11/25/2019 CLINICAL DATA:  53 year old male with a history of life threatening of bleeding secondary to portal hypertension and gastric varices. The patient is status post balloon occlusion retrograde trans venous obliteration (BRTO) on 11/22/2019, with recurrent hemorrhage confirmed by upper endoscopy EXAM: ULTRASOUND-GUIDED TRANS HEPATIC PORTAL VEIN ACCESS ULTRASOUND GUIDED ACCESS RIGHT JUGULAR VEIN. TIPS PLACEMENT COIL AND PLUG EMBOLIZATION OF MULTIPLE PORTAL VEIN TRIBUTARIES TO A PERSISTING ESOPHAGOGASTRIC VARICEAL NETWORK MEDICATIONS: As antibiotic prophylaxis, 2 G ANCEF was ordered pre-procedure and administered intravenously within one hour of incision. One unit of platelets was also administered IV during the procedure. ANESTHESIA/SEDATION: General - as administered by the Anesthesia department Vascular Interventional Radiology physicians: Dr. Earleen Newport, Dr. Kathlene Cote, Dr. Laurence Ferrari CONTRAST:   57m OMNIPAQUE IOHEXOL 300 MG/ML SOLN, 770mOMNIPAQUE IOHEXOL 300 MG/ML SOLN, 7041mMNIPAQUE IOHEXOL 300 MG/ML SOLN FLUOROSCOPY TIME:  Fluoroscopy Time: 66 minutes 36 seconds (6280 mGy). COMPLICATIONS: None PROCEDURE: Informed written consent was obtained from the patient's family after a thorough discussion of the procedural risks, benefits and alternatives. Specific risks discussed with TIPS/variceal embolization included: Bleeding, infection, vascular injury, need for further procedure/surgery, renal injury/renal failure, contrast reaction, non-target embolization, liver dysfunction/failure, hepatic encephalopathy, stroke (~1%), cardiopulmonary collapse, death. All questions were addressed. Maximal Sterile Barrier Technique was utilized including caps, mask, sterile gowns, sterile gloves, sterile drape, hand hygiene and skin antiseptic. A timeout was performed prior to the initiation of the procedure. Patient was positioned supine position on the table, with the right upper quadrant and the right neck prepped and draped in the usual sterile fashion. Ultrasound survey of the right upper quadrant was performed, with images stored and sent to PACs. Using ultrasound guidance, Chiba needle was used access the right portal system. Once we confirmed position in the portal system with portal venography, micro wire was advanced into the inferior mesenteric vein.  The inner dilator was advanced over the metal stiffener into the portal system, with the inner dilator advanced over the wire. Venogram confirmed are location within the portal system. The micro wire was then replaced into the inner dilator, with the transition point position at the apex of the catheter. Check flow vial was placed on the back and of the inner dilator. Ultrasound survey was then performed of the right neck, with images stored and sent to PACs. Ultrasound guidance was then used to access the right internal jugular vein with a micro puncture kit. The  wire was advanced under fluoroscopy into the right atrium, and a small incision was made. The needle was removed and the dilator was placed. The micro wire in the stiffener were removed and an 035 wire was then passed into the inferior vena cava. Ten French soft tissue dilation was performed on the wire, and then 10 [place] TIPS sheath was placed. Combination of Benson wire and multipurpose angled catheter were used to select the right hepatic vein. During the selection the right hepatic vein, accessory right hepatic vein was accessed with venography. Venogram confirmed location within the right hepatic vein. Tip sheath was then advanced over the catheter with a coaxial Amplatz wire, for a position cephalad to the transition point of the wire in the portal venous system. The Colapinto needle was then advanced through the TIPS sheath housed in the Teflon sheath over the Amplatz wire. Using oblique projections, only a single pass was required to access the portal venous system. Of note, the liver was very dense, with difficulty penetrating the portal vein wall with the colapinto needle. Access into the portal venous system was confirmed by aspirating portal venous blood and a venogram. A stiff Glidewire was then used to navigate through the portal vein, ultimately with purchase in the IMV. Needle was removed. The stock 5 French glide catheter was attempted to pass over the Glidewire into the portal system. The tip would not penetrate the portal vein wall. A 4 French straight glide cath was then advanced on the Glidewire, and with forward tension, ultimately the glide cath penetrated the portal vein wall, with good purchase into the portal vein. Venogram confirmed location. Amplatz wire was then passed into the splenic vein through the Glidewire. The next step of dilating the soft tissue tract with series of balloons was quite difficult given the density of the liver. Initially a 4 mm by 40 mm balloon was successful  passing into the portal vein with dilation of the soft tissue tract. Even after 4 mm balloon angioplasty, the sheath would not advanced over the Amplatz wire and balloon into the portal system, encountering resistance at the wall of the portal vein. 5 mm balloon angioplasty was then performed, with a similar resistance to passing the sheath into the portal vein. We attempted to pass the sheath with the coaxial dilator/introducer which was unsuccessful. 6 mm balloon angioplasty along the length of the tissue tract was ultimately successful, only after several reposition of the Amplatz wire into the distal splenic vein with the use of the glide catheter. Once the tip sheath was in the portal system, we confirmed location with angiogram. We selected a 10 mm x 2 x 8 viator stent, which was deployed and post dilated to 10 mm. Venogram confirm excellent flow through the TIPS shunt. Pigtail catheter was then advanced into the splenic vein with repeat portal venogram. This demonstrated significant perfusion of the gastroesophageal network via left gastric, posterior gastric, and  accessory posterior gastric contribution. We proceeded with embolization. Angled multipurpose catheter was used to select the largest of the posterior gastric veins. Coil embolization then proceeded with fibered 035 interlock coils, using multiple 10 mm diameter, 8 mm diameter, and 6 mm diameter coils. Once the flow was static in the first posterior gastric vein contribution, the catheter was withdrawn and repeat venogram was performed demonstrating accessory posterior gastric vein. This vein was selected with the 035 multipurpose catheter and coil embolized with fibered interlock coils, as well as a Amplatzer 4 plug. Repeat splenic vein/portal vein venogram demonstrated occlusion of enlarged short gastric vein after the sclerotherapy treatment, with persisting left gastric vein contribution to the gastroesophageal network. The multipurpose catheter  was then used to select these left gastric vein contributors, which were then coil embolized/plug embolized with a combination of fibrin coils and Amplatzer 4. Final angiogram was performed. The access in the right portal system was removed. Access in the right internal jugular vein was removed with hemostasis achieved. Sterile bandage was placed. Patient tolerated the procedure well and remained hemodynamically stable throughout. No complications were encountered and no significant blood loss. FINDINGS: Ultrasound guided access right internal jugular vein demonstrates wide patency. Creation of the portal systemic shunt demonstrated a significantly dense liver parenchyma, of which even the colapinto needle had difficulty crossing. Excellent flow through the TIPS shunt after creation, with 10 mm by 2 x 8 stent placed, dilated to 10 mm. Multiple portal contribution identified from the portal and splenic vein including posterior gastric, accessory posterior gastric, left gastric, and accessory left gastric venous inflow into a persisting gastroesophageal varices network. Each of these was coil embolized/plug embolized to stasis with no persisting flow into the network upon completion. The preferential portal venous contribution that was treated during the balloon occlusion retrograde trans venous ablation on Sunday was a large short gastric vein which remains occluded on today's study. Persisting changes of prior sclerotherapy in the gastric variceal system. IMPRESSION: Status post TIPS creation and treatment of persisting portal contribution to gastroesophageal variceal network by combination of coil and plug embolization to stasis. Signed, Dulcy Fanny. Dellia Nims, RPVI [facility] Inter[facility] Radiology Specialists [facility] Radiology Electronically Signed   By: Corrie Mckusick D.O.   On: 11/25/2019 13:21   IR Angiogram Selective Each Additional Vessel  Result Date: 11/25/2019 CLINICAL DATA:  53 year old male with  a history of life threatening of bleeding secondary to portal hypertension and gastric varices. The patient is status post balloon occlusion retrograde trans venous obliteration (BRTO) on 11/22/2019, with recurrent hemorrhage confirmed by upper endoscopy EXAM: ULTRASOUND-GUIDED TRANS HEPATIC PORTAL VEIN ACCESS ULTRASOUND GUIDED ACCESS RIGHT JUGULAR VEIN. TIPS PLACEMENT COIL AND PLUG EMBOLIZATION OF MULTIPLE PORTAL VEIN TRIBUTARIES TO A PERSISTING ESOPHAGOGASTRIC VARICEAL NETWORK MEDICATIONS: As antibiotic prophylaxis, 2 G ANCEF was ordered pre-procedure and administered intravenously within one hour of incision. One unit of platelets was also administered IV during the procedure. ANESTHESIA/SEDATION: General - as administered by the Anesthesia department Vascular Interventional Radiology physicians: Dr. Earleen Newport, Dr. Kathlene Cote, Dr. Laurence Ferrari CONTRAST:  7m OMNIPAQUE IOHEXOL 300 MG/ML SOLN, 72mOMNIPAQUE IOHEXOL 300 MG/ML SOLN, 7059mMNIPAQUE IOHEXOL 300 MG/ML SOLN FLUOROSCOPY TIME:  Fluoroscopy Time: 66 minutes 36 seconds (6280 mGy). COMPLICATIONS: None PROCEDURE: Informed written consent was obtained from the patient's family after a thorough discussion of the procedural risks, benefits and alternatives. Specific risks discussed with TIPS/variceal embolization included: Bleeding, infection, vascular injury, need for further procedure/surgery, renal injury/renal failure, contrast reaction, non-target embolization, liver dysfunction/failure, hepatic encephalopathy,  stroke (~1%), cardiopulmonary collapse, death. All questions were addressed. Maximal Sterile Barrier Technique was utilized including caps, mask, sterile gowns, sterile gloves, sterile drape, hand hygiene and skin antiseptic. A timeout was performed prior to the initiation of the procedure. Patient was positioned supine position on the table, with the right upper quadrant and the right neck prepped and draped in the usual sterile fashion. Ultrasound survey  of the right upper quadrant was performed, with images stored and sent to PACs. Using ultrasound guidance, Chiba needle was used access the right portal system. Once we confirmed position in the portal system with portal venography, micro wire was advanced into the inferior mesenteric vein. The inner dilator was advanced over the metal stiffener into the portal system, with the inner dilator advanced over the wire. Venogram confirmed are location within the portal system. The micro wire was then replaced into the inner dilator, with the transition point position at the apex of the catheter. Check flow vial was placed on the back and of the inner dilator. Ultrasound survey was then performed of the right neck, with images stored and sent to PACs. Ultrasound guidance was then used to access the right internal jugular vein with a micro puncture kit. The wire was advanced under fluoroscopy into the right atrium, and a small incision was made. The needle was removed and the dilator was placed. The micro wire in the stiffener were removed and an 035 wire was then passed into the inferior vena cava. Ten French soft tissue dilation was performed on the wire, and then 10 [place] TIPS sheath was placed. Combination of Benson wire and multipurpose angled catheter were used to select the right hepatic vein. During the selection the right hepatic vein, accessory right hepatic vein was accessed with venography. Venogram confirmed location within the right hepatic vein. Tip sheath was then advanced over the catheter with a coaxial Amplatz wire, for a position cephalad to the transition point of the wire in the portal venous system. The Colapinto needle was then advanced through the TIPS sheath housed in the Teflon sheath over the Amplatz wire. Using oblique projections, only a single pass was required to access the portal venous system. Of note, the liver was very dense, with difficulty penetrating the portal vein wall with the  colapinto needle. Access into the portal venous system was confirmed by aspirating portal venous blood and a venogram. A stiff Glidewire was then used to navigate through the portal vein, ultimately with purchase in the IMV. Needle was removed. The stock 5 French glide catheter was attempted to pass over the Glidewire into the portal system. The tip would not penetrate the portal vein wall. A 4 French straight glide cath was then advanced on the Glidewire, and with forward tension, ultimately the glide cath penetrated the portal vein wall, with good purchase into the portal vein. Venogram confirmed location. Amplatz wire was then passed into the splenic vein through the Glidewire. The next step of dilating the soft tissue tract with series of balloons was quite difficult given the density of the liver. Initially a 4 mm by 40 mm balloon was successful passing into the portal vein with dilation of the soft tissue tract. Even after 4 mm balloon angioplasty, the sheath would not advanced over the Amplatz wire and balloon into the portal system, encountering resistance at the wall of the portal vein. 5 mm balloon angioplasty was then performed, with a similar resistance to passing the sheath into the portal vein. We attempted to  pass the sheath with the coaxial dilator/introducer which was unsuccessful. 6 mm balloon angioplasty along the length of the tissue tract was ultimately successful, only after several reposition of the Amplatz wire into the distal splenic vein with the use of the glide catheter. Once the tip sheath was in the portal system, we confirmed location with angiogram. We selected a 10 mm x 2 x 8 viator stent, which was deployed and post dilated to 10 mm. Venogram confirm excellent flow through the TIPS shunt. Pigtail catheter was then advanced into the splenic vein with repeat portal venogram. This demonstrated significant perfusion of the gastroesophageal network via left gastric, posterior gastric,  and accessory posterior gastric contribution. We proceeded with embolization. Angled multipurpose catheter was used to select the largest of the posterior gastric veins. Coil embolization then proceeded with fibered 035 interlock coils, using multiple 10 mm diameter, 8 mm diameter, and 6 mm diameter coils. Once the flow was static in the first posterior gastric vein contribution, the catheter was withdrawn and repeat venogram was performed demonstrating accessory posterior gastric vein. This vein was selected with the 035 multipurpose catheter and coil embolized with fibered interlock coils, as well as a Amplatzer 4 plug. Repeat splenic vein/portal vein venogram demonstrated occlusion of enlarged short gastric vein after the sclerotherapy treatment, with persisting left gastric vein contribution to the gastroesophageal network. The multipurpose catheter was then used to select these left gastric vein contributors, which were then coil embolized/plug embolized with a combination of fibrin coils and Amplatzer 4. Final angiogram was performed. The access in the right portal system was removed. Access in the right internal jugular vein was removed with hemostasis achieved. Sterile bandage was placed. Patient tolerated the procedure well and remained hemodynamically stable throughout. No complications were encountered and no significant blood loss. FINDINGS: Ultrasound guided access right internal jugular vein demonstrates wide patency. Creation of the portal systemic shunt demonstrated a significantly dense liver parenchyma, of which even the colapinto needle had difficulty crossing. Excellent flow through the TIPS shunt after creation, with 10 mm by 2 x 8 stent placed, dilated to 10 mm. Multiple portal contribution identified from the portal and splenic vein including posterior gastric, accessory posterior gastric, left gastric, and accessory left gastric venous inflow into a persisting gastroesophageal varices  network. Each of these was coil embolized/plug embolized to stasis with no persisting flow into the network upon completion. The preferential portal venous contribution that was treated during the balloon occlusion retrograde trans venous ablation on Sunday was a large short gastric vein which remains occluded on today's study. Persisting changes of prior sclerotherapy in the gastric variceal system. IMPRESSION: Status post TIPS creation and treatment of persisting portal contribution to gastroesophageal variceal network by combination of coil and plug embolization to stasis. Signed, Dulcy Fanny. Dellia Nims, RPVI Vascular and Interventional Radiology Specialists [facility] Radiology Electronically Signed   By: Corrie Mckusick D.O.   On: 11/25/2019 13:21   IR Tips  Result Date: 11/25/2019 CLINICAL DATA:  53 year old male with a history of life threatening of bleeding secondary to portal hypertension and gastric varices. The patient is status post balloon occlusion retrograde trans venous obliteration (BRTO) on 11/22/2019, with recurrent hemorrhage confirmed by upper endoscopy EXAM: ULTRASOUND-GUIDED TRANS HEPATIC PORTAL VEIN ACCESS ULTRASOUND GUIDED ACCESS RIGHT JUGULAR VEIN. TIPS PLACEMENT COIL AND PLUG EMBOLIZATION OF MULTIPLE PORTAL VEIN TRIBUTARIES TO A PERSISTING ESOPHAGOGASTRIC VARICEAL NETWORK MEDICATIONS: As antibiotic prophylaxis, 2 G ANCEF was ordered pre-procedure and administered intravenously within one hour of incision. One  unit of platelets was also administered IV during the procedure. ANESTHESIA/SEDATION: General - as administered by the Anesthesia department Vascular Interventional Radiology physicians: Dr. Earleen Newport, Dr. Kathlene Cote, Dr. Laurence Ferrari CONTRAST:  51m OMNIPAQUE IOHEXOL 300 MG/ML SOLN, 770mOMNIPAQUE IOHEXOL 300 MG/ML SOLN, 7033mMNIPAQUE IOHEXOL 300 MG/ML SOLN FLUOROSCOPY TIME:  Fluoroscopy Time: 66 minutes 36 seconds (6280 mGy). COMPLICATIONS: None PROCEDURE: Informed written consent was  obtained from the patient's family after a thorough discussion of the procedural risks, benefits and alternatives. Specific risks discussed with TIPS/variceal embolization included: Bleeding, infection, vascular injury, need for further procedure/surgery, renal injury/renal failure, contrast reaction, non-target embolization, liver dysfunction/failure, hepatic encephalopathy, stroke (~1%), cardiopulmonary collapse, death. All questions were addressed. Maximal Sterile Barrier Technique was utilized including caps, mask, sterile gowns, sterile gloves, sterile drape, hand hygiene and skin antiseptic. A timeout was performed prior to the initiation of the procedure. Patient was positioned supine position on the table, with the right upper quadrant and the right neck prepped and draped in the usual sterile fashion. Ultrasound survey of the right upper quadrant was performed, with images stored and sent to PACs. Using ultrasound guidance, Chiba needle was used access the right portal system. Once we confirmed position in the portal system with portal venography, micro wire was advanced into the inferior mesenteric vein. The inner dilator was advanced over the metal stiffener into the portal system, with the inner dilator advanced over the wire. Venogram confirmed are location within the portal system. The micro wire was then replaced into the inner dilator, with the transition point position at the apex of the catheter. Check flow vial was placed on the back and of the inner dilator. Ultrasound survey was then performed of the right neck, with images stored and sent to PACs. Ultrasound guidance was then used to access the right internal jugular vein with a micro puncture kit. The wire was advanced under fluoroscopy into the right atrium, and a small incision was made. The needle was removed and the dilator was placed. The micro wire in the stiffener were removed and an 035 wire was then passed into the inferior vena cava.  Ten French soft tissue dilation was performed on the wire, and then 10 [place] sheath was placed. Combination of Benson wire and multipurpose angled catheter were used to select the right hepatic vein. During the selection the right hepatic vein, accessory right hepatic vein was accessed with venography. Venogram confirmed location within the right hepatic vein. Tip sheath was then advanced over the catheter with a coaxial Amplatz wire, for a position cephalad to the transition point of the wire in the portal venous system. The Colapinto needle was then advanced through the TIPS sheath housed in the Teflon sheath over the Amplatz wire. Using oblique projections, only a single pass was required to access the portal venous system. Of note, the liver was very dense, with difficulty penetrating the portal vein wall with the colapinto needle. Access into the portal venous system was confirmed by aspirating portal venous blood and a venogram. A stiff Glidewire was then used to navigate through the portal vein, ultimately with purchase in the IMV. Needle was removed. The stock 5 French glide catheter was attempted to pass over the Glidewire into the portal system. The tip would not penetrate the portal vein wall. A 4 French straight glide cath was then advanced on the Glidewire, and with forward tension, ultimately the glide cath penetrated the portal vein wall, with good purchase into the portal vein. Venogram confirmed  location. Amplatz wire was then passed into the splenic vein through the Glidewire. The next step of dilating the soft tissue tract with series of balloons was quite difficult given the density of the liver. Initially a 4 mm by 40 mm balloon was successful passing into the portal vein with dilation of the soft tissue tract. Even after 4 mm balloon angioplasty, the sheath would not advanced over the Amplatz wire and balloon into the portal system, encountering resistance at the wall of the portal vein.  5 mm balloon angioplasty was then performed, with a similar resistance to passing the sheath into the portal vein. We attempted to pass the sheath with the coaxial dilator/introducer which was unsuccessful. 6 mm balloon angioplasty along the length of the tissue tract was ultimately successful, only after several reposition of the Amplatz wire into the distal splenic vein with the use of the glide catheter. Once the tip sheath was in the portal system, we confirmed location with angiogram. We selected a 10 mm x 2 x 8 viator stent, which was deployed and post dilated to 10 mm. Venogram confirm excellent flow through the TIPS shunt. Pigtail catheter was then advanced into the splenic vein with repeat portal venogram. This demonstrated significant perfusion of the gastroesophageal network via left gastric, posterior gastric, and accessory posterior gastric contribution. We proceeded with embolization. Angled multipurpose catheter was used to select the largest of the posterior gastric veins. Coil embolization then proceeded with fibered 035 interlock coils, using multiple 10 mm diameter, 8 mm diameter, and 6 mm diameter coils. Once the flow was static in the first posterior gastric vein contribution, the catheter was withdrawn and repeat venogram was performed demonstrating accessory posterior gastric vein. This vein was selected with the 035 multipurpose catheter and coil embolized with fibered interlock coils, as well as a Amplatzer 4 plug. Repeat splenic vein/portal vein venogram demonstrated occlusion of enlarged short gastric vein after the sclerotherapy treatment, with persisting left gastric vein contribution to the gastroesophageal network. The multipurpose catheter was then used to select these left gastric vein contributors, which were then coil embolized/plug embolized with a combination of fibrin coils and Amplatzer 4. Final angiogram was performed. The access in the right portal system was removed. Access  in the right internal jugular vein was removed with hemostasis achieved. Sterile bandage was placed. Patient tolerated the procedure well and remained hemodynamically stable throughout. No complications were encountered and no significant blood loss. FINDINGS: Ultrasound guided access right internal jugular vein demonstrates wide patency. Creation of the portal systemic shunt demonstrated a significantly dense liver parenchyma, of which even the colapinto needle had difficulty crossing. Excellent flow through the TIPS shunt after creation, with 10 mm by 2 x 8 stent placed, dilated to 10 mm. Multiple portal contribution identified from the portal and splenic vein including posterior gastric, accessory posterior gastric, left gastric, and accessory left gastric venous inflow into a persisting gastroesophageal varices network. Each of these was coil embolized/plug embolized to stasis with no persisting flow into the network upon completion. The preferential portal venous contribution that was treated during the balloon occlusion retrograde trans venous ablation on Sunday was a large short gastric vein which remains occluded on today's study. Persisting changes of prior sclerotherapy in the gastric variceal system. IMPRESSION: Status post TIPS creation and treatment of persisting portal contribution to gastroesophageal variceal network by combination of coil and plug embolization to stasis. Signed, Dulcy Fanny. Dellia Nims, [place] V[facility] and Interventional Radiology Specialists [facility] Radiology Electronically Signed  By: Corrie Mckusick D.O.   On: 11/25/2019 13:21   DG Chest Port 1 View  Result Date: 11/24/2019 CLINICAL DATA:  Abnormal breath sounds EXAM: PORTABLE CHEST 1 VIEW COMPARISON:  Chest x-ray dated 11/22/2011 FINDINGS: The right-sided central venous catheter is stable in positioning. The heart size is enlarged. There are new small bilateral pleural effusions. There are new bibasilar airspace opacities  favored to represent postoperative atelectasis. IMPRESSION: 1. New small bilateral pleural effusions and bibasilar airspace opacities favored to represent postoperative atelectasis. 2. Stable right central venous catheter. Electronically Signed   By: Constance Holster M.D.   On: 11/24/2019 21:39   IR EMBO ART  VEN HEMORR LYMPH EXTRAV  INC GUIDE ROADMAPPING  Result Date: 11/25/2019 CLINICAL DATA:  53 year old male with a history of life threatening of bleeding secondary to portal hypertension and gastric varices. The patient is status post balloon occlusion retrograde trans venous obliteration (BRTO) on 11/22/2019, with recurrent hemorrhage confirmed by upper endoscopy EXAM: ULTRASOUND-GUIDED TRANS HEPATIC PORTAL VEIN ACCESS ULTRASOUND GUIDED ACCESS RIGHT JUGULAR VEIN. TIPS PLACEMENT COIL AND PLUG EMBOLIZATION OF MULTIPLE PORTAL VEIN TRIBUTARIES TO A PERSISTING ESOPHAGOGASTRIC VARICEAL NETWORK MEDICATIONS: As antibiotic prophylaxis, 2 G ANCEF was ordered pre-procedure and administered intravenously within one hour of incision. One unit of platelets was also administered IV during the procedure. ANESTHESIA/SEDATION: General - as administered by the Anesthesia department Vascular Interventional Radiology physicians: Dr. Earleen Newport, Dr. Kathlene Cote, Dr. Laurence Ferrari CONTRAST:  38m OMNIPAQUE IOHEXOL 300 MG/ML SOLN, 760mOMNIPAQUE IOHEXOL 300 MG/ML SOLN, 7035mMNIPAQUE IOHEXOL 300 MG/ML SOLN FLUOROSCOPY TIME:  Fluoroscopy Time: 66 minutes 36 seconds (6280 mGy). COMPLICATIONS: None PROCEDURE: Informed written consent was obtained from the patient's family after a thorough discussion of the procedural risks, benefits and alternatives. Specific risks discussed with TIPS/variceal embolization included: Bleeding, infection, vascular injury, need for further procedure/surgery, renal injury/renal failure, contrast reaction, non-target embolization, liver dysfunction/failure, hepatic encephalopathy, stroke (~1%), cardiopulmonary  collapse, death. All questions were addressed. Maximal Sterile Barrier Technique was utilized including caps, mask, sterile gowns, sterile gloves, sterile drape, hand hygiene and skin antiseptic. A timeout was performed prior to the initiation of the procedure. Patient was positioned supine position on the table, with the right upper quadrant and the right neck prepped and draped in the usual sterile fashion. Ultrasound survey of the right upper quadrant was performed, with images stored and sent to PACs. Using ultrasound guidance, Chiba needle was used access the right portal system. Once we confirmed position in the portal system with portal venography, micro wire was advanced into the inferior mesenteric vein. The inner dilator was advanced over the metal stiffener into the portal system, with the inner dilator advanced over the wire. Venogram confirmed are location within the portal system. The micro wire was then replaced into the inner dilator, with the transition point position at the apex of the catheter. Check flow vial was placed on the back and of the inner dilator. Ultrasound survey was then performed of the right neck, with images stored and sent to PACs. Ultrasound guidance was then used to access the right internal jugular vein with a micro puncture kit. The wire was advanced under fluoroscopy into the right atrium, and a small incision was made. The needle was removed and the dilator was placed. The micro wire in the stiffener were removed and an 035 wire was then passed into the inferior vena cava. Ten French soft tissue dilation was performed on the wire, and then 10 [place] sheath was placed. Combination of  Benson wire and multipurpose angled catheter were used to select the right hepatic vein. During the selection the right hepatic vein, accessory right hepatic vein was accessed with venography. Venogram confirmed location within the right hepatic vein. Tip sheath was then advanced over the  catheter with a coaxial Amplatz wire, for a position cephalad to the transition point of the wire in the portal venous system. The Colapinto needle was then advanced through the TIPS sheath housed in the Teflon sheath over the Amplatz wire. Using oblique projections, only a single pass was required to access the portal venous system. Of note, the liver was very dense, with difficulty penetrating the portal vein wall with the colapinto needle. Access into the portal venous system was confirmed by aspirating portal venous blood and a venogram. A stiff Glidewire was then used to navigate through the portal vein, ultimately with purchase in the IMV. Needle was removed. The stock 5 French glide catheter was attempted to pass over the Glidewire into the portal system. The tip would not penetrate the portal vein wall. A 4 French straight glide cath was then advanced on the Glidewire, and with forward tension, ultimately the glide cath penetrated the portal vein wall, with good purchase into the portal vein. Venogram confirmed location. Amplatz wire was then passed into the splenic vein through the Glidewire. The next step of dilating the soft tissue tract with series of balloons was quite difficult given the density of the liver. Initially a 4 mm by 40 mm balloon was successful passing into the portal vein with dilation of the soft tissue tract. Even after 4 mm balloon angioplasty, the sheath would not advanced over the Amplatz wire and balloon into the portal system, encountering resistance at the wall of the portal vein. 5 mm balloon angioplasty was then performed, with a similar resistance to passing the sheath into the portal vein. We attempted to pass the sheath with the coaxial dilator/introducer which was unsuccessful. 6 mm balloon angioplasty along the length of the tissue tract was ultimately successful, only after several reposition of the Amplatz wire into the distal splenic vein with the use of the glide  catheter. Once the tip sheath was in the portal system, we confirmed location with angiogram. We selected a 10 mm x 2 x 8 viator stent, which was deployed and post dilated to 10 mm. Venogram confirm excellent flow through the TIPS shunt. Pigtail catheter was then advanced into the splenic vein with repeat portal venogram. This demonstrated significant perfusion of the gastroesophageal network via left gastric, posterior gastric, and accessory posterior gastric contribution. We proceeded with embolization. Angled multipurpose catheter was used to select the largest of the posterior gastric veins. Coil embolization then proceeded with fibered 035 interlock coils, using multiple 10 mm diameter, 8 mm diameter, and 6 mm diameter coils. Once the flow was static in the first posterior gastric vein contribution, the catheter was withdrawn and repeat venogram was performed demonstrating accessory posterior gastric vein. This vein was selected with the 035 multipurpose catheter and coil embolized with fibered interlock coils, as well as a Amplatzer 4 plug. Repeat splenic vein/portal vein venogram demonstrated occlusion of enlarged short gastric vein after the sclerotherapy treatment, with persisting left gastric vein contribution to the gastroesophageal network. The multipurpose catheter was then used to select these left gastric vein contributors, which were then coil embolized/plug embolized with a combination of fibrin coils and Amplatzer 4. Final angiogram was performed. The access in the right portal system was removed. Access  in the right internal jugular vein was removed with hemostasis achieved. Sterile bandage was placed. Patient tolerated the procedure well and remained hemodynamically stable throughout. No complications were encountered and no significant blood loss. FINDINGS: Ultrasound guided access right internal jugular vein demonstrates wide patency. Creation of the portal systemic shunt demonstrated a  significantly dense liver parenchyma, of which even the colapinto needle had difficulty crossing. Excellent flow through the TIPS shunt after creation, with 10 mm by 2 x 8 stent placed, dilated to 10 mm. Multiple portal contribution identified from the portal and splenic vein including posterior gastric, accessory posterior gastric, left gastric, and accessory left gastric venous inflow into a persisting gastroesophageal varices network. Each of these was coil embolized/plug embolized to stasis with no persisting flow into the network upon completion. The preferential portal venous contribution that was treated during the balloon occlusion retrograde trans venous ablation on Sunday was a large short gastric vein which remains occluded on today's study. Persisting changes of prior sclerotherapy in the gastric variceal system. IMPRESSION: Status post TIPS creation and treatment of persisting portal contribution to gastroesophageal variceal network by combination of coil and plug embolization to stasis. Signed, Dulcy Fanny. Dellia Nims, RPVI [facility] Radiology Electronically Signed   By: Corrie Mckusick D.O.   On: 11/25/2019 13:21   CT Angio Abd/Pel w/ and/or w/o  Result Date: 11/24/2019 CLINICAL DATA:  Status post BRTO, GI bleeding EXAM: CTA ABDOMEN AND PELVIS WITH CONTRAST TECHNIQUE: Multidetector CT imaging of the abdomen and pelvis was performed using the standard protocol during bolus administration of intravenous contrast. Multiplanar reconstructed images and MIPs were obtained and reviewed to evaluate the vascular anatomy. CONTRAST:  128m OMNIPAQUE IOHEXOL 350 MG/ML SOLN COMPARISON:  11/22/2019 FINDINGS: VASCULAR Aorta: Aorta atherosclerotic without significant aneurysm, dissection or occlusive process. No retroperitoneal hemorrhage or hematoma. Celiac: Main celiac origin is widely patent including its splenic and right hepatic branches. Separate origin of the  left gastric and left hepatic artery also appearing patent. SMA: SMA origin is patent. Renals: Main renal arteries are widely patent. IMA: Small caliber IMA remains patent off the aorta. Inflow: Iliac atherosclerosis noted without significant inflow disease or occlusion. Proximal Outflow: Atherosclerotic changes of the proximal outflow. The right common femoral and proximal profunda femoral artery are both patent. Right proximal SFA occlusion noted. Left common femoral, profunda femoral, proximal SFA visualized are all patent. Veins: Dedicated venous imaging not performed. Interval balloon retrograde trans obliteration of the gastric varices. Gastric varices appear largely occluded with radiopaque sclerotherapy and coils noted. Scattered areas non target radiopaque sclerotherapy within small branches of the short gastric veins about the spleen in the left upper quadrant. Overall limited assessment because venous phase imaging was not performed. Review of the MIP images confirms the above findings. NON-VASCULAR Lower chest: Increased dependent basilar atelectasis and very small left effusion. Normal heart size. No pericardial effusion. Native coronary atherosclerosis. Hepatobiliary: Hepatic cirrhosis again evident with heterogeneous attenuation and micro nodularity to the surface. Limited assessment because of arterial phase imaging only. Small left hepatic dome subcentimeter probable cyst noted. Increased perihepatic ascites. Moderate gallbladder distension with layering gallstones again evident. No gross biliary obstruction or dilatation. Common bile duct nondilated. Pancreas: Unremarkable. No pancreatic ductal dilatation or surrounding inflammatory changes. Spleen: Stable mild splenomegaly.  Increased perisplenic ascites. Adrenals/Urinary Tract: Normal adrenal glands. No renal obstruction or hydronephrosis. No hydroureter or ureteral calculus. Bladder unremarkable. Stomach/Bowel: Negative for bowel obstruction,  significant dilatation ileus or free air. Portions of  the appendix are demonstrated and appear unremarkable. Increased stranding mesenteric edema. Exam of the bowel is limited but there is slight diffuse colonic bowel wall thickening suggesting mild colitis. New small amount abdominopelvic ascites. Lymphatic: No bulky adenopathy. Reproductive: No acute or significant finding by CT. Other: No inguinal or abdominal wall hernia. Increased mild diffuse lower body wall pelvic subcutaneous edema compatible with anasarca. Musculoskeletal: No acute osseous finding. Bilateral L5 pars defects noted. No anterolisthesis associated. Acute compression fracture. IMPRESSION: VASCULAR No evidence of significant CTA arterial GI bleeding. 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 Small amount of scattered non target radiopaque sclerotherapy within the perisplenic short gastric veins noted. Aortoiliac atherosclerosis without aneurysm or occlusive process Mesenteric and renal vasculature appear patent centrally. Chronic appearing proximal right SFA occlusion NON-VASCULAR Minor basilar atelectasis and trace left effusion Hepatic cirrhosis with developing mesenteric edema, new small amount of abdominopelvic ascites, and body anasarca. Cholelithiasis Nonspecific mild colonic wall thickening. Difficult to exclude associated pancolitis. Electronically Signed   By: Jerilynn Mages.  Shick M.D.   On: 11/24/2019 13:09    Medications: I have reviewed the patient's current medications.  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 Continue lactulose and Xifaxan, patient at high risk for hepatic encephalopathy  post TIPS Continue multivitamin, thiamine and folic acid. Patient on PPI twice daily and sucralfate 4 times a day for heartburn and acid reflux. Appears stable, to be transferred out of ICU.  Ronnette Juniper, MD 11/26/2019, 11:15 AM

## 2019-11-26 NOTE — Progress Notes (Signed)
Referring Physician(s): CCM Dr Lynetta Mare Dr Cristina Gong and Dr Therisa Doyne  Supervising Physician: Aletta Edouard  Patient Status:  [facility] - In-pt  Chief Complaint:  History of bleeding gastric varices s/p BRTO 11/22/2019 by Dr. Earleen Newport; with evidence of active bleeding from gastric varices on EGD 11/24/2019 s/p TIPS 11/24/2019 by Dr. Kathlene Cote and Dr. Earleen Newport.  Subjective:  Restful this am Feels like he "has air in stomach" Had some small amount of BRBPR this am No BM No pain No N/V  Hg 7.9 this am Has had no further transfusions since 12/15 BP stable  Tachy 112    Allergies: Patient has no known allergies.  Medications: Prior to Admission medications   Medication Sig Start Date End Date Taking? Authorizing Provider  acebutolol (SECTRAL) 400 MG capsule Take 1 capsule (400 mg total) by mouth 2 (two) times daily. 10/13/19  Yes Herminio Commons, MD  allopurinol (ZYLOPRIM) 100 MG tablet TAKE 1 TABLET BY MOUTH EVERY DAY Patient taking differently: Take 100 mg by mouth.  07/13/19  Yes [name], MD  aspirin EC 81 MG tablet Take 81 mg by mouth daily.   Yes [provider]  cholecalciferol (VITAMIN D3) 25 MCG (1000 UT) tablet Take 1,000 Units by mouth daily.   Yes [provider]  DEXILANT 60 MG capsule TAKE 1 CAPSULE BY MOUTH EVERY DAY Patient taking differently: Take 60 mg by mouth daily.  06/18/19  Yes [name], MD  famotidine (PEPCID) 10 MG tablet Take 10 mg by mouth as needed for heartburn or indigestion.   Yes [provider]  folic acid (FOLVITE) 861 MCG tablet Take 400 mcg by mouth daily.   Yes [provider]  gabapentin (NEURONTIN) 300 MG capsule TAKE 1 CAPSULE BY MOUTH FOUR TIMES DAILY Patient taking differently: Take 800 mg by mouth at bedtime.  08/24/19  Yes [name], MD  lactulose, encephalopathy, (CHRONULAC) 10 GM/15ML SOLN Take 15-30cc three times daily to Titrate for 3-4 soft bowel movements a day Patient  taking differently: Take 15-30cc three times daily to Titrate for 3-4 soft bowel movements a day (sometimes forgets mid day dose) 08/04/19  Yes Mahala Menghini, PA-C  LORazepam (ATIVAN) 1 MG tablet Take 0.5 tablets (0.5 mg total) by mouth 2 (two) times daily as needed. for anxiety Patient taking differently: Take 1 mg by mouth 2 (two) times daily as needed for anxiety. for anxiety 07/29/19  Yes [name], MD  nortriptyline (PAMELOR) 50 MG capsule TAKE 1 CAPSULE(50 MG) BY MOUTH AT BEDTIME 03/23/19  Yes [name], MD  Omega-3 Fatty Acids (FISH OIL PO) Take 3 capsules by mouth as needed.    Yes [provider]  ondansetron (ZOFRAN-ODT) 4 MG disintegrating tablet DISSOLVE 1 TABLET ON THE TONGUE EVERY 8 HOURS AS NEEDED FOR NAUSEA 08/19/19  Yes Carlis Stable, NP  PARoxetine (PAXIL) 20 MG tablet TAKE 1 TABLET(20 MG) BY MOUTH DAILY 09/22/19  Yes [name], MD  sildenafil (VIAGRA) 100 MG tablet TAKE 1/2 TABLET BY MOUTH EVERY DAY 06/18/19  Yes [name], MD  tobramycin-dexamethasone [facility]) ophthalmic solution INSTILL 1 DROP INTO THE AFFECTED EYE TID FOR 5 DAYS 09/17/19  Yes [provider]  vitamin B-12 (CYANOCOBALAMIN) 1000 MCG tablet Take 1,000 mcg by mouth daily.   Yes [provider]     Vital Signs: BP 134/75 (BP Location: Right Arm)   Pulse (!) 112   Temp 98.1 F (36.7 C) (Oral)  Resp 14   Ht 6' 1"  (1.854 m)   Wt 262 lb 5.6 oz (119 kg)   SpO2 94%   BMI 34.61 kg/m   Physical Exam Vitals reviewed.  Abdominal:     Palpations: Abdomen is soft.     Tenderness: There is no abdominal tenderness.  Musculoskeletal:        General: Normal range of motion.  Skin:    General: Skin is warm and dry.  Neurological:     Mental Status: He is alert and oriented to person, place, and time.  Psychiatric:        Behavior: Behavior normal.     Imaging: DG Chest 1 View  Result Date: 11/22/2019 CLINICAL DATA:  Central line placement EXAM: CHEST   1 VIEW COMPARISON:  06/01/2018 FINDINGS: Right neck vascular catheter is positioned with tip near the superior cavoatrial junction. Cardiomegaly. Mild, diffuse interstitial pulmonary opacity. IMPRESSION: 1. Right neck vascular catheter with tip near the superior cavoatrial junction. 2. Cardiomegaly with mild diffuse interstitial pulmonary opacity, which may reflect edema or infection. Electronically Signed   By: Eddie Candle M.D.   On: 11/22/2019 14:59   IR Angiogram Selective Each Additional Vessel  Result Date: 11/25/2019 CLINICAL DATA:  53 year old male with a history of life threatening of bleeding secondary to portal hypertension and gastric varices. The patient is status post balloon occlusion retrograde trans venous obliteration (BRTO) on 11/22/2019, with recurrent hemorrhage confirmed by upper endoscopy EXAM: ULTRASOUND-GUIDED TRANS HEPATIC PORTAL VEIN ACCESS ULTRASOUND GUIDED ACCESS RIGHT JUGULAR VEIN. TIPS PLACEMENT COIL AND PLUG EMBOLIZATION OF MULTIPLE PORTAL VEIN TRIBUTARIES TO A PERSISTING ESOPHAGOGASTRIC VARICEAL NETWORK MEDICATIONS: As antibiotic prophylaxis, 2 G ANCEF was ordered pre-procedure and administered intravenously within one hour of incision. One unit of platelets was also administered IV during the procedure. ANESTHESIA/SEDATION: General - as administered by the Anesthesia department Vascular Interventional Radiology physicians: Dr. Earleen Newport, Dr. Kathlene Cote, Dr. Laurence Ferrari CONTRAST:  40m OMNIPAQUE IOHEXOL 300 MG/ML SOLN, 771mOMNIPAQUE IOHEXOL 300 MG/ML SOLN, 7060mMNIPAQUE IOHEXOL 300 MG/ML SOLN FLUOROSCOPY TIME:  Fluoroscopy Time: 66 minutes 36 seconds (6280 mGy). COMPLICATIONS: None PROCEDURE: Informed written consent was obtained from the patient's family after a thorough discussion of the procedural risks, benefits and alternatives. Specific risks discussed with TIPS/variceal embolization included: Bleeding, infection, vascular injury, need for further procedure/surgery, renal  injury/renal failure, contrast reaction, non-target embolization, liver dysfunction/failure, hepatic encephalopathy, stroke (~1%), cardiopulmonary collapse, death. All questions were addressed. Maximal Sterile Barrier Technique was utilized including caps, mask, sterile gowns, sterile gloves, sterile drape, hand hygiene and skin antiseptic. A timeout was performed prior to the initiation of the procedure. Patient was positioned supine position on the table, with the right upper quadrant and the right neck prepped and draped in the usual sterile fashion. Ultrasound survey of the right upper quadrant was performed, with images stored and sent to PACs. Using ultrasound guidance, Chiba needle was used access the right portal system. Once we confirmed position in the portal system with portal venography, micro wire was advanced into the inferior mesenteric vein. The inner dilator was advanced over the metal stiffener into the portal system, with the inner dilator advanced over the wire. Venogram confirmed are location within the portal system. The micro wire was then replaced into the inner dilator, with the transition point position at the apex of the catheter. Check flow vial was placed on the back and of the inner dilator. Ultrasound survey was then performed of the right neck, with images stored and sent to  PACs. Ultrasound guidance was then used to access the right internal jugular vein with a micro puncture kit. The wire was advanced under fluoroscopy into the right atrium, and a small incision was made. The needle was removed and the dilator was placed. The micro wire in the stiffener were removed and an 035 wire was then passed into the inferior vena cava. Ten French soft tissue dilation was performed on the wire, and then 10 [place] TIPS sheath was placed. Combination of Benson wire and multipurpose angled catheter were used to select the right hepatic vein. During the selection the right hepatic vein, accessory  right hepatic vein was accessed with venography. Venogram confirmed location within the right hepatic vein. Tip sheath was then advanced over the catheter with a coaxial Amplatz wire, for a position cephalad to the transition point of the wire in the portal venous system. The Col[place]into needle was then advanced through the TIPS sheath housed in the Teflon sheath over the Amplatz wire. Using oblique projections, only a single pass was required to access the portal venous system. Of note, the liver was very dense, with difficulty penetrating the portal vein wall with the colapinto needle. Access into the portal venous system was confirmed by aspirating portal venous blood and a venogram. A stiff Glidewire was then used to navigate through the portal vein, ultimately with purchase in the IMV. Needle was removed. The stock 5 French glide catheter was attempted to pass over the Glidewire into the portal system. The tip would not penetrate the portal vein wall. A 4 French straight glide cath was then advanced on the Glidewire, and with forward tension, ultimately the glide cath penetrated the portal vein wall, with good purchase into the portal vein. Venogram confirmed location. Amplatz wire was then passed into the splenic vein through the Glidewire. The next step of dilating the soft tissue tract with series of balloons was quite difficult given the density of the liver. Initially a 4 mm by 40 mm balloon was successful passing into the portal vein with dilation of the soft tissue tract. Even after 4 mm balloon angioplasty, the sheath would not advanced over the Amplatz wire and balloon into the portal system, encountering resistance at the wall of the portal vein. 5 mm balloon angioplasty was then performed, with a similar resistance to passing the sheath into the portal vein. We attempted to pass the sheath with the coaxial dilator/introducer which was unsuccessful. 6 mm balloon angioplasty along the length of the  tissue tract was ultimately successful, only after several reposition of the Amplatz wire into the distal splenic vein with the use of the glide catheter. Once the tip sheath was in the portal system, we confirmed location with angiogram. We selected a 10 mm x 2 x 8 viator stent, which was deployed and post dilated to 10 mm. Venogram confirm excellent flow through the TIPS shunt. Pigtail catheter was then advanced into the splenic vein with repeat portal venogram. This demonstrated significant perfusion of the gastroesophageal network via left gastric, posterior gastric, and accessory posterior gastric contribution. We proceeded with embolization. Angled multipurpose catheter was used to select the largest of the posterior gastric veins. Coil embolization then proceeded with fibered 035 interlock coils, using multiple 10 mm diameter, 8 mm diameter, and 6 mm diameter coils. Once the flow was static in the first posterior gastric vein contribution, the catheter was withdrawn and repeat venogram was performed demonstrating accessory posterior gastric vein. This vein was selected with the [place]  catheter and coil embolized with fibered interlock coils, as well as a Amplatzer 4 plug. Repeat splenic vein/portal vein venogram demonstrated occlusion of enlarged short gastric vein after the sclerotherapy treatment, with persisting left gastric vein contribution to the gastroesophageal network. The multipurpose catheter was then used to select these left gastric vein contributors, which were then coil embolized/plug embolized with a combination of fibrin coils and Amplatzer 4. Final angiogram was performed. The access in the right portal system was removed. Access in the right internal jugular vein was removed with hemostasis achieved. Sterile bandage was placed. Patient tolerated the procedure well and remained hemodynamically stable throughout. No complications were encountered and no significant blood loss.  FINDINGS: Ultrasound guided access right internal jugular vein demonstrates wide patency. Creation of the portal systemic shunt demonstrated a significantly dense liver parenchyma, of which even the colapinto needle had difficulty crossing. Excellent flow through the TIPS shunt after creation, with 10 mm by 2 x 8 stent placed, dilated to 10 mm. Multiple portal contribution identified from the portal and splenic vein including posterior gastric, accessory posterior gastric, left gastric, and accessory left gastric venous inflow into a persisting gastroesophageal varices network. Each of these was coil embolized/plug embolized to stasis with no persisting flow into the network upon completion. The preferential portal venous contribution that was treated during the balloon occlusion retrograde trans venous ablation on Sunday was a large short gastric vein which remains occluded on today's study. Persisting changes of prior sclerotherapy in the gastric variceal system. IMPRESSION: Status post TIPS creation and treatment of persisting portal contribution to gastroesophageal variceal network by combination of coil and plug embolization to stasis. Signed, Dulcy Fanny. Dellia Nims, RPVI [facility] Inter[facility] Radiology [facility] Radiology Electronically Signed   By: Corrie Mckusick D.O.   On: 11/25/2019 13:21   IR Angiogram Selective Each Additional Vessel  Result Date: 11/25/2019 CLINICAL DATA:  53 year old male with a history of life threatening of bleeding secondary to portal hypertension and gastric varices. The patient is status post balloon occlusion retrograde trans venous obliteration (BRTO) on 11/22/2019, with recurrent hemorrhage confirmed by upper endoscopy EXAM: ULTRASOUND-GUIDED TRANS HEPATIC PORTAL VEIN ACCESS ULTRASOUND GUIDED ACCESS RIGHT JUGULAR VEIN. TIPS PLACEMENT COIL AND PLUG EMBOLIZATION OF MULTIPLE PORTAL VEIN TRIBUTARIES TO A PERSISTING ESOPHAGOGASTRIC VARICEAL NETWORK MEDICATIONS: As  antibiotic prophylaxis, 2 G ANCEF was ordered pre-procedure and administered intravenously within one hour of incision. One unit of platelets was also administered IV during the procedure. ANESTHESIA/SEDATION: General - as administered by the Anesthesia department Vascular Interventional Radiology physicians: Dr. Earleen Newport, Dr. Kathlene Cote, Dr. Laurence Ferrari CONTRAST:  74m OMNIPAQUE IOHEXOL 300 MG/ML SOLN, 732mOMNIPAQUE IOHEXOL 300 MG/ML SOLN, 7025mMNIPAQUE IOHEXOL 300 MG/ML SOLN FLUOROSCOPY TIME:  Fluoroscopy Time: 66 minutes 36 seconds (6280 mGy). COMPLICATIONS: None PROCEDURE: Informed written consent was obtained from the patient's family after a thorough discussion of the procedural risks, benefits and alternatives. Specific risks discussed with TIPS/variceal embolization included: Bleeding, infection, vascular injury, need for further procedure/surgery, renal injury/renal failure, contrast reaction, non-target embolization, liver dysfunction/failure, hepatic encephalopathy, stroke (~1%), cardiopulmonary collapse, death. All questions were addressed. Maximal Sterile Barrier Technique was utilized including caps, mask, sterile gowns, sterile gloves, sterile drape, hand hygiene and skin antiseptic. A timeout was performed prior to the initiation of the procedure. Patient was positioned supine position on the table, with the right upper quadrant and the right neck prepped and draped in the usual sterile fashion. Ultrasound survey of the right upper quadrant was performed, with images stored and  sent to PACs. Using ultrasound guidance, Chiba needle was used access the right portal system. Once we confirmed position in the portal system with portal venography, micro wire was advanced into the inferior mesenteric vein. The inner dilator was advanced over the metal stiffener into the portal system, with the inner dilator advanced over the wire. Venogram confirmed are location within the portal system. The micro wire was  then replaced into the inner dilator, with the transition point position at the apex of the catheter. Check flow vial was placed on the back and of the inner dilator. Ultrasound survey was then performed of the right neck, with images stored and sent to PACs. Ultrasound guidance was then used to access the right internal jugular vein with a micro puncture kit. The wire was advanced under fluoroscopy into the right atrium, and a small incision was made. The needle was removed and the dilator was placed. The micro wire in the stiffener were removed and an 035 wire was then passed into the inferior vena cava. Ten French soft tissue dilation was performed on the wire, and then 10 [place] TIPS sheath was placed. Combination of Benson wire and multipurpose angled catheter were used to select the right hepatic vein. During the selection the right hepatic vein, accessory right hepatic vein was accessed with venography. Venogram confirmed location within the right hepatic vein. Tip sheath was then advanced over the catheter with a coaxial Amplatz wire, for a position cephalad to the transition point of the wire in the portal venous system. The Colapinto needle was then advanced through the TIPS sheath housed in the Teflon sheath over the Amplatz wire. Using oblique projections, only a single pass was required to access the portal venous system. Of note, the liver was very dense, with difficulty penetrating the portal vein wall with the colapinto needle. Access into the portal venous system was confirmed by aspirating portal venous blood and a venogram. A stiff Glidewire was then used to navigate through the portal vein, ultimately with purchase in the IMV. Needle was removed. The stock 5 French glide catheter was attempted to pass over the Glidewire into the portal system. The tip would not penetrate the portal vein wall. A 4 French straight glide cath was then advanced on the Glidewire, and with forward tension, ultimately  the glide cath penetrated the portal vein wall, with good purchase into the portal vein. Venogram confirmed location. Amplatz wire was then passed into the splenic vein through the Glidewire. The next step of dilating the soft tissue tract with series of balloons was quite difficult given the density of the liver. Initially a 4 mm by 40 mm balloon was successful passing into the portal vein with dilation of the soft tissue tract. Even after 4 mm balloon angioplasty, the sheath would not advanced over the Amplatz wire and balloon into the portal system, encountering resistance at the wall of the portal vein. 5 mm balloon angioplasty was then performed, with a similar resistance to passing the sheath into the portal vein. We attempted to pass the sheath with the coaxial dilator/introducer which was unsuccessful. 6 mm balloon angioplasty along the length of the tissue tract was ultimately successful, only after several reposition of the Amplatz wire into the distal splenic vein with the use of the glide catheter. Once the tip sheath was in the portal system, we confirmed location with angiogram. We selected a 10 mm x 2 x 8 viator stent, which was deployed and post dilated to 10 mm.  Venogram confirm excellent flow through the TIPS shunt. Pigtail catheter was then advanced into the splenic vein with repeat portal venogram. This demonstrated significant perfusion of the gastroesophageal network via left gastric, posterior gastric, and accessory posterior gastric contribution. We proceeded with embolization. Angled multipurpose catheter was used to select the largest of the posterior gastric veins. Coil embolization then proceeded with fibered 035 interlock coils, using multiple 10 mm diameter, 8 mm diameter, and 6 mm diameter coils. Once the flow was static in the first posterior gastric vein contribution, the catheter was withdrawn and repeat venogram was performed demonstrating accessory posterior gastric vein. This  vein was selected with the 035 multipurpose catheter and coil embolized with fibered interlock coils, as well as a Amplatzer 4 plug. Repeat splenic vein/portal vein venogram demonstrated occlusion of enlarged short gastric vein after the sclerotherapy treatment, with persisting left gastric vein contribution to the gastroesophageal network. The multipurpose catheter was then used to select these left gastric vein contributors, which were then coil embolized/plug embolized with a combination of fibrin coils and Amplatzer 4. Final angiogram was performed. The access in the right portal system was removed. Access in the right internal jugular vein was removed with hemostasis achieved. Sterile bandage was placed. Patient tolerated the procedure well and remained hemodynamically stable throughout. No complications were encountered and no significant blood loss. FINDINGS: Ultrasound guided access right internal jugular vein demonstrates wide patency. Creation of the portal systemic shunt demonstrated a significantly dense liver parenchyma, of which even the colapinto needle had difficulty crossing. Excellent flow through the TIPS shunt after creation, with 10 mm by 2 x 8 stent placed, dilated to 10 mm. Multiple portal contribution identified from the portal and splenic vein including posterior gastric, accessory posterior gastric, left gastric, and accessory left gastric venous inflow into a persisting gastroesophageal varices network. Each of these was coil embolized/plug embolized to stasis with no persisting flow into the network upon completion. The preferential portal venous contribution that was treated during the balloon occlusion retrograde trans venous ablation on Sunday was a large short gastric vein which remains occluded on today's study. Persisting changes of prior sclerotherapy in the gastric variceal system. IMPRESSION: Status post TIPS creation and treatment of persisting portal contribution to  gastroesophageal variceal network by combination of coil and plug embolization to stasis. Signed, Dulcy Fanny. Dellia Nims, RPVI Vascular and Interventional Radiology Specialists [facility] Radiology Electronically Signed   By: Corrie Mckusick D.O.   On: 11/25/2019 13:21   IR Angiogram Selective Each Additional Vessel  Result Date: 11/25/2019 CLINICAL DATA:  53 year old male with a history of life threatening of bleeding secondary to portal hypertension and gastric varices. The patient is status post balloon occlusion retrograde trans venous obliteration (BRTO) on 11/22/2019, with recurrent hemorrhage confirmed by upper endoscopy EXAM: ULTRASOUND-GUIDED TRANS HEPATIC PORTAL VEIN ACCESS ULTRASOUND GUIDED ACCESS RIGHT JUGULAR VEIN. TIPS PLACEMENT COIL AND PLUG EMBOLIZATION OF MULTIPLE PORTAL VEIN TRIBUTARIES TO A PERSISTING ESOPHAGOGASTRIC VARICEAL NETWORK MEDICATIONS: As antibiotic prophylaxis, 2 G ANCEF was ordered pre-procedure and administered intravenously within one hour of incision. One unit of platelets was also administered IV during the procedure. ANESTHESIA/SEDATION: General - as administered by the Anesthesia department Vascular Interventional Radiology physicians: Dr. Earleen Newport, Dr. Kathlene Cote, Dr. Laurence Ferrari CONTRAST:  66m OMNIPAQUE IOHEXOL 300 MG/ML SOLN, 73mOMNIPAQUE IOHEXOL 300 MG/ML SOLN, 7051mMNIPAQUE IOHEXOL 300 MG/ML SOLN FLUOROSCOPY TIME:  Fluoroscopy Time: 66 minutes 36 seconds (6280 mGy). COMPLICATIONS: None PROCEDURE: Informed written consent was obtained from the patient's family after a  thorough discussion of the procedural risks, benefits and alternatives. Specific risks discussed with TIPS/variceal embolization included: Bleeding, infection, vascular injury, need for further procedure/surgery, renal injury/renal failure, contrast reaction, non-target embolization, liver dysfunction/failure, hepatic encephalopathy, stroke (~1%), cardiopulmonary collapse, death. All questions were addressed.  Maximal Sterile Barrier Technique was utilized including caps, mask, sterile gowns, sterile gloves, sterile drape, hand hygiene and skin antiseptic. A timeout was performed prior to the initiation of the procedure. Patient was positioned supine position on the table, with the right upper quadrant and the right neck prepped and draped in the usual sterile fashion. Ultrasound survey of the right upper quadrant was performed, with images stored and sent to PACs. Using ultrasound guidance, Chiba needle was used access the right portal system. Once we confirmed position in the portal system with portal venography, micro wire was advanced into the inferior mesenteric vein. The inner dilator was advanced over the metal stiffener into the portal system, with the inner dilator advanced over the wire. Venogram confirmed are location within the portal system. The micro wire was then replaced into the inner dilator, with the transition point position at the apex of the catheter. Check flow vial was placed on the back and of the inner dilator. Ultrasound survey was then performed of the right neck, with images stored and sent to PACs. Ultrasound guidance was then used to access the right internal jugular vein with a micro puncture kit. The wire was advanced under fluoroscopy into the right atrium, and a small incision was made. The needle was removed and the dilator was placed. The micro wire in the stiffener were removed and an 035 wire was then passed into the inferior vena cava. Ten French soft tissue dilation was performed on the wire, and then 10 [place] TIPS sheath was placed. Combination of Benson wire and multipurpose angled catheter were used to select the right hepatic vein. During the selection the right hepatic vein, accessory right hepatic vein was accessed with venography. Venogram confirmed location within the right hepatic vein. Tip sheath was then advanced over the catheter with a coaxial Amplatz wire, for a  position cephalad to the transition point of the wire in the portal venous system. The Colapinto needle was then advanced through the TIPS sheath housed in the Teflon sheath over the Amplatz wire. Using oblique projections, only a single pass was required to access the portal venous system. Of note, the liver was very dense, with difficulty penetrating the portal vein wall with the colapinto needle. Access into the portal venous system was confirmed by aspirating portal venous blood and a venogram. A stiff Glidewire was then used to navigate through the portal vein, ultimately with purchase in the IMV. Needle was removed. The stock 5 French glide catheter was attempted to pass over the Glidewire into the portal system. The tip would not penetrate the portal vein wall. A 4 French straight glide cath was then advanced on the Glidewire, and with forward tension, ultimately the glide cath penetrated the portal vein wall, with good purchase into the portal vein. Venogram confirmed location. Amplatz wire was then passed into the splenic vein through the Glidewire. The next step of dilating the soft tissue tract with series of balloons was quite difficult given the density of the liver. Initially a 4 mm by 40 mm balloon was successful passing into the portal vein with dilation of the soft tissue tract. Even after 4 mm balloon angioplasty, the sheath would not advanced over the Amplatz wire and balloon  into the portal system, encountering resistance at the wall of the portal vein. 5 mm balloon angioplasty was then performed, with a similar resistance to passing the sheath into the portal vein. We attempted to pass the sheath with the coaxial dilator/introducer which was unsuccessful. 6 mm balloon angioplasty along the length of the tissue tract was ultimately successful, only after several reposition of the Amplatz wire into the distal splenic vein with the use of the glide catheter. Once the tip sheath was in the portal  system, we confirmed location with angiogram. We selected a 10 mm x 2 x 8 viator stent, which was deployed and post dilated to 10 mm. Venogram confirm excellent flow through the TIPS shunt. Pigtail catheter was then advanced into the splenic vein with repeat portal venogram. This demonstrated significant perfusion of the gastroesophageal network via left gastric, posterior gastric, and accessory posterior gastric contribution. We proceeded with embolization. Angled multipurpose catheter was used to select the largest of the posterior gastric veins. Coil embolization then proceeded with fibered 035 interlock coils, using multiple 10 mm diameter, 8 mm diameter, and 6 mm diameter coils. Once the flow was static in the first posterior gastric vein contribution, the catheter was withdrawn and repeat venogram was performed demonstrating accessory posterior gastric vein. This vein was selected with the 035 multipurpose catheter and coil embolized with fibered interlock coils, as well as a Amplatzer 4 plug. Repeat splenic vein/portal vein venogram demonstrated occlusion of enlarged short gastric vein after the sclerotherapy treatment, with persisting left gastric vein contribution to the gastroesophageal network. The multipurpose catheter was then used to select these left gastric vein contributors, which were then coil embolized/plug embolized with a combination of fibrin coils and Amplatzer 4. Final angiogram was performed. The access in the right portal system was removed. Access in the right internal jugular vein was removed with hemostasis achieved. Sterile bandage was placed. Patient tolerated the procedure well and remained hemodynamically stable throughout. No complications were encountered and no significant blood loss. FINDINGS: Ultrasound guided access right internal jugular vein demonstrates wide patency. Creation of the portal systemic shunt demonstrated a significantly dense liver parenchyma, of which even the  colapinto needle had difficulty crossing. Excellent flow through the TIPS shunt after creation, with 10 mm by 2 x 8 stent placed, dilated to 10 mm. Multiple portal contribution identified from the portal and splenic vein including posterior gastric, accessory posterior gastric, left gastric, and accessory left gastric venous inflow into a persisting gastroesophageal varices network. Each of these was coil embolized/plug embolized to stasis with no persisting flow into the network upon completion. The preferential portal venous contribution that was treated during the balloon occlusion retrograde trans venous ablation on Sunday was a large short gastric vein which remains occluded on today's study. Persisting changes of prior sclerotherapy in the gastric variceal system. IMPRESSION: Status post TIPS creation and treatment of persisting portal contribution to gastroesophageal variceal network by combination of coil and plug embolization to stasis. Signed, Dulcy Fanny. Dellia Nims, RPVI [facility] Radiology Electronically Signed   By: Corrie Mckusick D.O.   On: 11/25/2019 13:21   IR Angiogram Selective Each Additional Vessel  Result Date: 11/25/2019 CLINICAL DATA:  53 year old male with a history of life threatening of bleeding secondary to portal hypertension and gastric varices. The patient is status post balloon occlusion retrograde trans venous obliteration (BRTO) on 11/22/2019, with recurrent hemorrhage confirmed by upper endoscopy EXAM: ULTRASOUND-GUIDED TRANS HEPATIC PORTAL VEIN ACCESS ULTRASOUND GUIDED ACCESS  RIGHT JUGULAR VEIN. TIPS PLACEMENT COIL AND PLUG EMBOLIZATION OF MULTIPLE PORTAL VEIN TRIBUTARIES TO A PERSISTING ESOPHAGOGASTRIC VARICEAL NETWORK MEDICATIONS: As antibiotic prophylaxis, 2 G ANCEF was ordered pre-procedure and administered intravenously within one hour of incision. One unit of platelets was also administered IV during the procedure.  ANESTHESIA/SEDATION: General - as administered by the Anesthesia department Vascular Interventional Radiology physicians: Dr. Earleen Newport, Dr. Kathlene Cote, Dr. Laurence Ferrari CONTRAST:  47m OMNIPAQUE IOHEXOL 300 MG/ML SOLN, 711mOMNIPAQUE IOHEXOL 300 MG/ML SOLN, 706mMNIPAQUE IOHEXOL 300 MG/ML SOLN FLUOROSCOPY TIME:  Fluoroscopy Time: 66 minutes 36 seconds (6280 mGy). COMPLICATIONS: None PROCEDURE: Informed written consent was obtained from the patient's family after a thorough discussion of the procedural risks, benefits and alternatives. Specific risks discussed with TIPS/variceal embolization included: Bleeding, infection, vascular injury, need for further procedure/surgery, renal injury/renal failure, contrast reaction, non-target embolization, liver dysfunction/failure, hepatic encephalopathy, stroke (~1%), cardiopulmonary collapse, death. All questions were addressed. Maximal Sterile Barrier Technique was utilized including caps, mask, sterile gowns, sterile gloves, sterile drape, hand hygiene and skin antiseptic. A timeout was performed prior to the initiation of the procedure. Patient was positioned supine position on the table, with the right upper quadrant and the right neck prepped and draped in the usual sterile fashion. Ultrasound survey of the right upper quadrant was performed, with images stored and sent to PACs. Using ultrasound guidance, Chiba needle was used access the right portal system. Once we confirmed position in the portal system with portal venography, micro wire was advanced into the inferior mesenteric vein. The inner dilator was advanced over the metal stiffener into the portal system, with the inner dilator advanced over the wire. Venogram confirmed are location within the portal system. The micro wire was then replaced into the inner dilator, with the transition point position at the apex of the catheter. Check flow vial was placed on the back and of the inner dilator. Ultrasound survey was  then performed of the right neck, with images stored and sent to PACs. Ultrasound guidance was then used to access the right internal jugular vein with a micro puncture kit. The wire was advanced under fluoroscopy into the right atrium, and a small incision was made. The needle was removed and the dilator was placed. The micro wire in the stiffener were removed and an 035 wire was then passed into the inferior vena cava. Ten French soft tissue dilation was performed on the wire, and then 10 [place] sheath was placed. Combination of Benson wire and multipurpose angled catheter were used to select the right hepatic vein. During the selection the right hepatic vein, accessory right hepatic vein was accessed with venography. Venogram confirmed location within the right hepatic vein. Tip sheath was then advanced over the catheter with a coaxial Amplatz wire, for a position cephalad to the transition point of the wire in the portal venous system. The Colapinto needle was then advanced through the TIPS sheath housed in the Teflon sheath over the Amplatz wire. Using oblique projections, only a single pass was required to access the portal venous system. Of note, the liver was very dense, with difficulty penetrating the portal vein wall with the colapinto needle. Access into the portal venous system was confirmed by aspirating portal venous blood and a venogram. A stiff Glidewire was then used to navigate through the portal vein, ultimately with purchase in the IMV. Needle was removed. The stock 5 French glide catheter was attempted to pass over the Glidewire into the portal system. The tip would not  penetrate the portal vein wall. A 4 French straight glide cath was then advanced on the Glidewire, and with forward tension, ultimately the glide cath penetrated the portal vein wall, with good purchase into the portal vein. Venogram confirmed location. Amplatz wire was then passed into the splenic vein through the  Glidewire. The next step of dilating the soft tissue tract with series of balloons was quite difficult given the density of the liver. Initially a 4 mm by 40 mm balloon was successful passing into the portal vein with dilation of the soft tissue tract. Even after 4 mm balloon angioplasty, the sheath would not advanced over the Amplatz wire and balloon into the portal system, encountering resistance at the wall of the portal vein. 5 mm balloon angioplasty was then performed, with a similar resistance to passing the sheath into the portal vein. We attempted to pass the sheath with the coaxial dilator/introducer which was unsuccessful. 6 mm balloon angioplasty along the length of the tissue tract was ultimately successful, only after several reposition of the Amplatz wire into the distal splenic vein with the use of the glide catheter. Once the tip sheath was in the portal system, we confirmed location with angiogram. We selected a 10 mm x 2 x 8 viator stent, which was deployed and post dilated to 10 mm. Venogram confirm excellent flow through the TIPS shunt. Pigtail catheter was then advanced into the splenic vein with repeat portal venogram. This demonstrated significant perfusion of the gastroesophageal network via left gastric, posterior gastric, and accessory posterior gastric contribution. We proceeded with embolization. Angled multipurpose catheter was used to select the largest of the posterior gastric veins. Coil embolization then proceeded with fibered 035 interlock coils, using multiple 10 mm diameter, 8 mm diameter, and 6 mm diameter coils. Once the flow was static in the first posterior gastric vein contribution, the catheter was withdrawn and repeat venogram was performed demonstrating accessory posterior gastric vein. This vein was selected with the 035 multipurpose catheter and coil embolized with fibered interlock coils, as well as a Amplatzer 4 plug. Repeat splenic vein/portal vein venogram  demonstrated occlusion of enlarged short gastric vein after the sclerotherapy treatment, with persisting left gastric vein contribution to the gastroesophageal network. The multipurpose catheter was then used to select these left gastric vein contributors, which were then coil embolized/plug embolized with a combination of fibrin coils and Amplatzer 4. Final angiogram was performed. The access in the right portal system was removed. Access in the right internal jugular vein was removed with hemostasis achieved. Sterile bandage was placed. Patient tolerated the procedure well and remained hemodynamically stable throughout. No complications were encountered and no significant blood loss. FINDINGS: Ultrasound guided access right internal jugular vein demonstrates wide patency. Creation of the portal systemic shunt demonstrated a significantly dense liver parenchyma, of which even the colapinto needle had difficulty crossing. Excellent flow through the TIPS shunt after creation, with 10 mm by 2 x 8 stent placed, dilated to 10 mm. Multiple portal contribution identified from the portal and splenic vein including posterior gastric, accessory posterior gastric, left gastric, and accessory left gastric venous inflow into a persisting gastroesophageal varices network. Each of these was coil embolized/plug embolized to stasis with no persisting flow into the network upon completion. The preferential portal venous contribution that was treated during the balloon occlusion retrograde trans venous ablation on Sunday was a large short gastric vein which remains occluded on today's study. Persisting changes of prior sclerotherapy in the gastric variceal system. IMPRESSION:  Status post TIPS creation and treatment of persisting portal contribution to gastroesophageal variceal network by combination of coil and plug embolization to stasis. Signed, Dulcy Fanny. Dellia Nims, RPVI Vascular and Interventional Radiology Specialists [facility]  Radiology Electronically Signed   By: Corrie Mckusick D.O.   On: 11/25/2019 13:21   IR Angiogram Selective Each Additional Vessel  Result Date: 11/25/2019 CLINICAL DATA:  53 year old male with a history of life threatening of bleeding secondary to portal hypertension and gastric varices. The patient is status post balloon occlusion retrograde trans venous obliteration (BRTO) on 11/22/2019, with recurrent hemorrhage confirmed by upper endoscopy EXAM: ULTRASOUND-GUIDED TRANS HEPATIC PORTAL VEIN ACCESS ULTRASOUND GUIDED ACCESS RIGHT JUGULAR VEIN. TIPS PLACEMENT COIL AND PLUG EMBOLIZATION OF MULTIPLE PORTAL VEIN TRIBUTARIES TO A PERSISTING ESOPHAGOGASTRIC VARICEAL NETWORK MEDICATIONS: As antibiotic prophylaxis, 2 G ANCEF was ordered pre-procedure and administered intravenously within one hour of incision. One unit of platelets was also administered IV during the procedure. ANESTHESIA/SEDATION: General - as administered by the Anesthesia department Vascular Interventional Radiology physicians: Dr. Earleen Newport, Dr. Kathlene Cote, Dr. Laurence Ferrari CONTRAST:  63m OMNIPAQUE IOHEXOL 300 MG/ML SOLN, 779mOMNIPAQUE IOHEXOL 300 MG/ML SOLN, 7035mMNIPAQUE IOHEXOL 300 MG/ML SOLN FLUOROSCOPY TIME:  Fluoroscopy Time: 66 minutes 36 seconds (6280 mGy). COMPLICATIONS: None PROCEDURE: Informed written consent was obtained from the patient's family after a thorough discussion of the procedural risks, benefits and alternatives. Specific risks discussed with TIPS/variceal embolization included: Bleeding, infection, vascular injury, need for further procedure/surgery, renal injury/renal failure, contrast reaction, non-target embolization, liver dysfunction/failure, hepatic encephalopathy, stroke (~1%), cardiopulmonary collapse, death. All questions were addressed. Maximal Sterile Barrier Technique was utilized including caps, mask, sterile gowns, sterile gloves, sterile drape, hand hygiene and skin antiseptic. A timeout was performed prior to the  initiation of the procedure. Patient was positioned supine position on the table, with the right upper quadrant and the right neck prepped and draped in the usual sterile fashion. Ultrasound survey of the right upper quadrant was performed, with images stored and sent to PACs. Using ultrasound guidance, Chiba needle was used access the right portal system. Once we confirmed position in the portal system with portal venography, micro wire was advanced into the inferior mesenteric vein. The inner dilator was advanced over the metal stiffener into the portal system, with the inner dilator advanced over the wire. Venogram confirmed are location within the portal system. The micro wire was then replaced into the inner dilator, with the transition point position at the apex of the catheter. Check flow vial was placed on the back and of the inner dilator. Ultrasound survey was then performed of the right neck, with images stored and sent to PACs. Ultrasound guidance was then used to access the right internal jugular vein with a micro puncture kit. The wire was advanced under fluoroscopy into the right atrium, and a small incision was made. The needle was removed and the dilator was placed. The micro wire in the stiffener were removed and an 035 wire was then passed into the inferior vena cava. Ten French soft tissue dilation was performed on the wire, and then 10 [place] sheath was placed. Combination of Benson wire and multipurpose angled catheter were used to select the right hepatic vein. During the selection the right hepatic vein, accessory right hepatic vein was accessed with venography. Venogram confirmed location within the right hepatic vein. Tip sheath was then advanced over the catheter with a coaxial Amplatz wire, for a position cephalad to the transition point of the wire in the  portal venous system. The Colapinto needle was then advanced through the TIPS sheath housed in the Teflon sheath over the Amplatz  wire. Using oblique projections, only a single pass was required to access the portal venous system. Of note, the liver was very dense, with difficulty penetrating the portal vein wall with the colapinto needle. Access into the portal venous system was confirmed by aspirating portal venous blood and a venogram. A stiff Glidewire was then used to navigate through the portal vein, ultimately with purchase in the IMV. Needle was removed. The stock 5 French glide catheter was attempted to pass over the Glidewire into the portal system. The tip would not penetrate the portal vein wall. A 4 French straight glide cath was then advanced on the Glidewire, and with forward tension, ultimately the glide cath penetrated the portal vein wall, with good purchase into the portal vein. Venogram confirmed location. Amplatz wire was then passed into the splenic vein through the Glidewire. The next step of dilating the soft tissue tract with series of balloons was quite difficult given the density of the liver. Initially a 4 mm by 40 mm balloon was successful passing into the portal vein with dilation of the soft tissue tract. Even after 4 mm balloon angioplasty, the sheath would not advanced over the Amplatz wire and balloon into the portal system, encountering resistance at the wall of the portal vein. 5 mm balloon angioplasty was then performed, with a similar resistance to passing the sheath into the portal vein. We attempted to pass the sheath with the coaxial dilator/introducer which was unsuccessful. 6 mm balloon angioplasty along the length of the tissue tract was ultimately successful, only after several reposition of the Amplatz wire into the distal splenic vein with the use of the glide catheter. Once the tip sheath was in the portal system, we confirmed location with angiogram. We selected a 10 mm x 2 x 8 viator stent, which was deployed and post dilated to 10 mm. Venogram confirm excellent flow through the TIPS shunt.  Pigtail catheter was then advanced into the splenic vein with repeat portal venogram. This demonstrated significant perfusion of the gastroesophageal network via left gastric, posterior gastric, and accessory posterior gastric contribution. We proceeded with embolization. Angled multipurpose catheter was used to select the largest of the posterior gastric veins. Coil embolization then proceeded with fibered 035 interlock coils, using multiple 10 mm diameter, 8 mm diameter, and 6 mm diameter coils. Once the flow was static in the first posterior gastric vein contribution, the catheter was withdrawn and repeat venogram was performed demonstrating accessory posterior gastric vein. This vein was selected with the 035 multipurpose catheter and coil embolized with fibered interlock coils, as well as a Amplatzer 4 plug. Repeat splenic vein/portal vein venogram demonstrated occlusion of enlarged short gastric vein after the sclerotherapy treatment, with persisting left gastric vein contribution to the gastroesophageal network. The multipurpose catheter was then used to select these left gastric vein contributors, which were then coil embolized/plug embolized with a combination of fibrin coils and Amplatzer 4. Final angiogram was performed. The access in the right portal system was removed. Access in the right internal jugular vein was removed with hemostasis achieved. Sterile bandage was placed. Patient tolerated the procedure well and remained hemodynamically stable throughout. No complications were encountered and no significant blood loss. FINDINGS: Ultrasound guided access right internal jugular vein demonstrates wide patency. Creation of the portal systemic shunt demonstrated a significantly dense liver parenchyma, of which even the colapinto needle  had difficulty crossing. Excellent flow through the TIPS shunt after creation, with 10 mm by 2 x 8 stent placed, dilated to 10 mm. Multiple portal contribution identified  from the portal and splenic vein including posterior gastric, accessory posterior gastric, left gastric, and accessory left gastric venous inflow into a persisting gastroesophageal varices network. Each of these was coil embolized/plug embolized to stasis with no persisting flow into the network upon completion. The preferential portal venous contribution that was treated during the balloon occlusion retrograde trans venous ablation on Sunday was a large short gastric vein which remains occluded on today's study. Persisting changes of prior sclerotherapy in the gastric variceal system. IMPRESSION: Status post TIPS creation and treatment of persisting portal contribution to gastroesophageal variceal network by combination of coil and plug embolization to stasis. Signed, Dulcy Fanny. Dellia Nims, RPVI Vascular and Interventional Radiology Specialists [facility] Radiology Electronically Signed   By: Corrie Mckusick D.O.   On: 11/25/2019 13:21   IR Angiogram Selective Each Additional Vessel  Result Date: 11/22/2019 CLINICAL DATA:  53 year old male with alcoholic cirrhosis and upper at GI hemorrhage, [facility] [facility] with gastric variceal hemorrhage, status post endoscopic identification and clipping. He has continued to have GI blood loss with need for pressor support, and presents now for urgent treatment with BRTO and/or TIPS EXAM: ULTRASOUND GUIDED ACCESS RIGHT COMMON FEMORAL VEIN LEFT RENAL VEIN ANGIOGRAM RETROGRADE SCLEROTHERAPY OF GASTRIC VARICES, ASSISTED WITH COIL EMBOLIZATION MEDICATIONS: As antibiotic prophylaxis, 2 G ANCEF was ordered pre-procedure and administered intravenously within one hour of incision. One unit of platelets was also administered IV during the procedure. ANESTHESIA/SEDATION: General - as administered by the Anesthesia department CONTRAST:  90 cc IV contrast FLUOROSCOPY TIME:  Fluoroscopy Time: 19 minutes 42 seconds (12/29/2041 mGy). COMPLICATIONS: None PROCEDURE:  Informed written consent was obtained from the patient and the patient's family after a thorough discussion of the procedural risks, benefits and alternatives. Specific risks that were addressed regarding BRTO include, bleeding, infection, contrast reaction, kidney injury, need for further procedure or surgery including TIPS and/ or endoscopy, pulmonary embolism, stroke, worsening of ascites, worsening of esophageal varices, paradoxical liver failure, varix rupture, cardiopulmonary collapse, death. All questions were addressed. Maximal Sterile Barrier Technique was utilized including caps, mask, sterile gowns, sterile gloves, sterile drape, hand hygiene and skin antiseptic. A timeout was performed prior to the initiation of the procedure. Ultrasound survey of the right inguinal region was performed with images stored and sent to PACs. A single wall puncture needle was used access the right common femoral artery under ultrasound guidance with saline syringe. With venous blood flow returned, a Bentson wire was observed to enter the common femoral vein and the IVC under fluoroscopy. The needle was removed, and a small incision was made, and 9 [place] dilator was passed over the Bentson wire. A 10 French TIPS sheath was then passed over the Bentson wire after manually creating a significant leftward current on the catheter. The inner dilator and wire were removed, and a 5 [place] cobra catheter was passed over the Bentson wire into the IVC. Cobra catheter was used to select the left renal vein orifice. Combination of the Bentson wire, Glidewire, Rosen wire were used to gain purchased into the renal vein, with renal vein limited venogram performed to identify the in flowing gastro renal shunt. With a safety curve on a stiff Amplatz wire position into superior pole renal vein, the TIPS sheath was advanced into the left renal vein over the Cobra catheter. Pullback  reinforced straight sheath selection technique (PRESSS) was  performed, and as the tip sheath was withdrawn, contrast was used to identified the inflow of the gastro renal shunt. With the coaxial Amplatz wire in position, a combination of a standard Kumpe catheter and a 4 French angled glide catheter were used to cannulate the orifice of the gastro renal shunt flowing shunt opposite the adrenal vein inflow. Once the Glidewire was advanced into the shunt, the catheter was advanced, and the Glidewire was exchanged for a stiff Amplatz wire with a safety curve on the tip. The renal vein buddy wire was withdrawn, and the TIPS sheath was advanced into the gastro renal shunt. As the 10 [place] TIPS sheath was clearly not long enough at 35 cm, a 45 cm 12 French sheath was placed. This sheath was then advanced into the most inferior aspects of the shunt orifice into the renal vein, though would not advance further given the length. Once the glide catheter and the Glidewire were in a reasonable location within the shunt, the catheter was withdrawn and then a balloon occlusion catheter was advanced over the Amplatz wire into the gastro renal shunt. Amplatz wire was then withdrawn. Angiogram was then performed to confirmed location within the gastro renal shunt outflow. Initial balloon inflation did not achieve stasis of the shunt, and the balloon was deflated with repositioning needed. There was difficulty passing the [place] balloon catheter into a more distal position of the shunt, likely secondary to tortuosity and/or venous web. Exchange for an 8 [place] Merci balloon, 95 cm, was performed on an Amplatz wire. A more distal position within the shunt was achieved. Balloon on the [facility] was inflated on the balloon occlusion catheter and was withdrawn to the first encountered web to achieve hemostasis. Angiogram confirmed stasis of flow. A CO2 angiogram was also performed, identifying the posterior gastric veins as the stopping point. A penumbra Lantern microcatheter and a fathom  wire were navigated into the shunt. Once we confirmed location within the outflow with standard angiogram, treatment was initiated. A foamed solution of detergent/sclerosant was mixed in a standard Tessari method, with 1:2:3 ratio of ethiodized oil: sodium tetradecyl sulfate: Room air. The sclerosed sent was infused under fluoroscopic guidance, with observation of reflux into the afferent veins. Once reflux was identified into the posterior gastric vein, sclerosing was completed. This required only approximately 15 cc of foam sclerotherapy in the above ratio. Once the posterior gastric veins were identified, we stop treatment. The micro catheter was withdrawn to the tip of the balloon occlusion catheter, and coil mass was deposited with deployment of multiple Ruby coils. This was performed with 24 by 60 cm coil, 28 x 60 cm coil, and 3 by 32 x 60 cm coil. The balloon was deflated under fluoroscopy, with no motion identified. The Merci balloon was slightly withdrawn into the more distal shunt (closer to the renal vein) and angiogram was performed confirming complete stasis of flow beyond the coil mass. Once we were confident there was no migration, balloon was withdrawn and the sheath was withdrawn. Hemostasis was achieved with manual compression. Patient remained hemodynamically stable throughout. No complications were encountered and no significant blood loss was encountered. IMPRESSION: Status post ultrasound guided access right common femoral vein for coil-assisted BRTO (sclerotherapy) as treatment for life-threatening gastric variceal hemorrhage. Signed, Dulcy Fanny. Dellia Nims, [place] [facility] Radiology Plan PLAN: Observe in PACU, then returned ICU Continue current management including serial H and H Local wound  care management at the common femoral vein access site Repeat CT angiogram abdomen/pelvis after 24 hours-48 hours on this admission Follow-up vascular  Interventional Radiology Clinic in 4-8 weeks Electronically Signed   By: Corrie Mckusick D.O.   On: 11/22/2019 14:32   IR Venogram Renal Uni Left  Result Date: 11/22/2019 CLINICAL DATA:  53 year old male with alcoholic cirrhosis and upper at GI hemorrhage, transferred from [facility] with gastric variceal hemorrhage, status post endoscopic identification and clipping. He has continued to have GI blood loss with need for pressor support, and presents now for urgent treatment with BRTO and/or TIPS EXAM: ULTRASOUND GUIDED ACCESS RIGHT COMMON FEMORAL VEIN LEFT RENAL VEIN ANGIOGRAM RETROGRADE SCLEROTHERAPY OF GASTRIC VARICES, ASSISTED WITH COIL EMBOLIZATION MEDICATIONS: As antibiotic prophylaxis, 2 G ANCEF was ordered pre-procedure and administered intravenously within one hour of incision. One unit of platelets was also administered IV during the procedure. ANESTHESIA/SEDATION: General - as administered by the Anesthesia department CONTRAST:  90 cc IV contrast FLUOROSCOPY TIME:  Fluoroscopy Time: 19 minutes 42 seconds (12/29/2041 mGy). COMPLICATIONS: None PROCEDURE: Informed written consent was obtained from the patient and the patient's family after a thorough discussion of the procedural risks, benefits and alternatives. Specific risks that were addressed regarding BRTO include, bleeding, infection, contrast reaction, kidney injury, need for further procedure or surgery including TIPS and/ or endoscopy, pulmonary embolism, stroke, worsening of ascites, worsening of esophageal varices, paradoxical liver failure, varix rupture, cardiopulmonary collapse, death. All questions were addressed. Maximal Sterile Barrier Technique was utilized including caps, mask, sterile gowns, sterile gloves, sterile drape, hand hygiene and skin antiseptic. A timeout was performed prior to the initiation of the procedure. Ultrasound survey of the right inguinal region was performed with images stored and sent to PACs. A single  wall puncture needle was used access the right common femoral artery under ultrasound guidance with saline syringe. With venous blood flow returned, a Bentson wire was observed to enter the common femoral vein and the IVC under fluoroscopy. The needle was removed, and a small incision was made, and 9 [place] dilator was passed over the Bentson wire. A 10 French TIPS sheath was then passed over the Bentson wire after manually creating a significant leftward current on the catheter. The inner dilator and wire were removed, and a 5 [place] cobra catheter was passed over the Bentson wire into the IVC. Cobra catheter was used to select the left renal vein orifice. Combination of the Bentson wire, Glidewire, Rosen wire were used to gain purchased into the renal vein, with renal vein limited venogram performed to identify the in flowing gastro renal shunt. With a safety curve on a stiff Amplatz wire position into superior pole renal vein, the TIPS sheath was advanced into the left renal vein over the Cobra catheter. Pullback reinforced straight sheath selection technique (PRESSS) was performed, and as the tip sheath was withdrawn, contrast was used to identified the inflow of the gastro renal shunt. With the coaxial Amplatz wire in position, a combination of a standard Kumpe catheter and a 4 French angled glide catheter were used to cannulate the orifice of the gastro renal shunt flowing shunt opposite the adrenal vein inflow. Once the Glidewire was advanced into the shunt, the catheter was advanced, and the Glidewire was exchanged for a stiff Amplatz wire with a safety curve on the tip. The renal vein buddy wire was withdrawn, and the TIPS sheath was advanced into the gastro renal shunt. As the 10 [place] TIPS sheath was clearly not  long enough at 35 cm, a 45 cm 12 French sheath was placed. This sheath was then advanced into the most inferior aspects of the shunt orifice into the renal vein, though would not advance further  given the length. Once the glide catheter and the Glidewire were in a reasonable location within the shunt, the catheter was withdrawn and then a balloon occlusion catheter was advanced over the Amplatz wire into the gastro renal shunt. Amplatz wire was then withdrawn. Angiogram was then performed to confirmed location within the gastro renal shunt outflow. Initial balloon inflation did not achieve stasis of the shunt, and the balloon was deflated with repositioning needed. There was difficulty passing the [place] balloon catheter into a more distal position of the shunt, likely secondary to tortuosity and/or venous web. Exchange for an 8 [place] Merci balloon, 95 cm, was performed on an Amplatz wire. A more distal position within the shunt was achieved. Balloon on the [facility] was inflated on the balloon occlusion catheter and was withdrawn to the first encountered web to achieve hemostasis. Angiogram confirmed stasis of flow. A CO2 angiogram was also performed, identifying the posterior gastric veins as the stopping point. A penumbra Lantern microcatheter and a fathom wire were navigated into the shunt. Once we confirmed location within the outflow with standard angiogram, treatment was initiated. A foamed solution of detergent/sclerosant was mixed in a standard Tessari method, with 1:2:3 ratio of ethiodized oil: sodium tetradecyl sulfate: Room air. The sclerosed sent was infused under fluoroscopic guidance, with observation of reflux into the afferent veins. Once reflux was identified into the posterior gastric vein, sclerosing was completed. This required only approximately 15 cc of foam sclerotherapy in the above ratio. Once the posterior gastric veins were identified, we stop treatment. The micro catheter was withdrawn to the tip of the balloon occlusion catheter, and coil mass was deposited with deployment of multiple Ruby coils. This was performed with 24 by 60 cm coil, 28 x 60 cm coil, and 3 by 32 x  60 cm coil. The balloon was deflated under fluoroscopy, with no motion identified. The Merci balloon was slightly withdrawn into the more distal shunt (closer to the renal vein) and angiogram was performed confirming complete stasis of flow beyond the coil mass. Once we were confident there was no migration, balloon was withdrawn and the sheath was withdrawn. Hemostasis was achieved with manual compression. Patient remained hemodynamically stable throughout. No complications were encountered and no significant blood loss was encountered. IMPRESSION: Status post ultrasound guided access right common femoral vein for coil-assisted BRTO (sclerotherapy) as treatment for life-threatening gastric variceal hemorrhage. Signed, Dulcy Fanny. Dellia Nims, [place] [facility] Radiology Plan PLAN: Observe in PACU, then returned ICU Continue current management including serial H and H Local wound care management at the common femoral vein access site Repeat CT angiogram abdomen/pelvis after 24 hours-48 hours on this admission Follow-up vascular Interventional Radiology Clinic in 4-8 weeks Electronically Signed   By: Corrie Mckusick D.O.   On: 11/22/2019 14:32   IR Tips  Result Date: 11/25/2019 CLINICAL DATA:  53 year old male with a history of life threatening of bleeding secondary to portal hypertension and gastric varices. The patient is status post balloon occlusion retrograde trans venous obliteration (BRTO) on 11/22/2019, with recurrent hemorrhage confirmed by upper endoscopy EXAM: ULTRASOUND-GUIDED TRANS HEPATIC PORTAL VEIN ACCESS ULTRASOUND GUIDED ACCESS RIGHT JUGULAR VEIN. TIPS PLACEMENT COIL AND PLUG EMBOLIZATION OF MULTIPLE PORTAL VEIN TRIBUTARIES TO A PERSISTING ESOPHAGOGASTRIC VARICEAL NETWORK MEDICATIONS:  As antibiotic prophylaxis, 2 G ANCEF was ordered pre-procedure and administered intravenously within one hour of incision. One unit of platelets was also administered IV  during the procedure. ANESTHESIA/SEDATION: General - as administered by the Anesthesia department Vascular Interventional Radiology physicians: Dr. Earleen Newport, Dr. Kathlene Cote, Dr. Laurence Ferrari CONTRAST:  10m OMNIPAQUE IOHEXOL 300 MG/ML SOLN, 761mOMNIPAQUE IOHEXOL 300 MG/ML SOLN, 7048mMNIPAQUE IOHEXOL 300 MG/ML SOLN FLUOROSCOPY TIME:  Fluoroscopy Time: 66 minutes 36 seconds (6280 mGy). COMPLICATIONS: None PROCEDURE: Informed written consent was obtained from the patient's family after a thorough discussion of the procedural risks, benefits and alternatives. Specific risks discussed with TIPS/variceal embolization included: Bleeding, infection, vascular injury, need for further procedure/surgery, renal injury/renal failure, contrast reaction, non-target embolization, liver dysfunction/failure, hepatic encephalopathy, stroke (~1%), cardiopulmonary collapse, death. All questions were addressed. Maximal Sterile Barrier Technique was utilized including caps, mask, sterile gowns, sterile gloves, sterile drape, hand hygiene and skin antiseptic. A timeout was performed prior to the initiation of the procedure. Patient was positioned supine position on the table, with the right upper quadrant and the right neck prepped and draped in the usual sterile fashion. Ultrasound survey of the right upper quadrant was performed, with images stored and sent to PACs. Using ultrasound guidance, Chiba needle was used access the right portal system. Once we confirmed position in the portal system with portal venography, micro wire was advanced into the inferior mesenteric vein. The inner dilator was advanced over the metal stiffener into the portal system, with the inner dilator advanced over the wire. Venogram confirmed are location within the portal system. The micro wire was then replaced into the inner dilator, with the transition point position at the apex of the catheter. Check flow vial was placed on the back and of the inner dilator.  Ultrasound survey was then performed of the right neck, with images stored and sent to PACs. Ultrasound guidance was then used to access the right internal jugular vein with a micro puncture kit. The wire was advanced under fluoroscopy into the right atrium, and a small incision was made. The needle was removed and the dilator was placed. The micro wire in the stiffener were removed and an 035 wire was then passed into the inferior vena cava. Ten French soft tissue dilation was performed on the wire, and then 10 [place] sheath was placed. Combination of [place] wire and multipurpose angled catheter were used to select the right hepatic vein. During the selection the right hepatic vein, accessory right hepatic vein was accessed with venography. Venogram confirmed location within the right hepatic vein. Tip sheath was then advanced over the catheter with a coaxial Amplatz wire, for a position cephalad to the transition point of the wire in the portal venous system. The [place] needle was then advanced through the TIPS sheath housed in the Teflon sheath over the Amplatz wire. Using oblique projections, only a single pass was required to access the portal venous system. Of note, the liver was very dense, with difficulty penetrating the portal vein wall with the colapinto needle. Access into the portal venous system was confirmed by aspirating portal venous blood and a venogram. A stiff Glidewire was then used to navigate through the portal vein, ultimately with purchase in the IMV. Needle was removed. The stock 5 French glide catheter was attempted to pass over the Glidewire into the portal system. The tip would not penetrate the portal vein wall. A 4 French straight glide cath was then advanced on the Glidewire, and with forward tension,  ultimately the glide cath penetrated the portal vein wall, with good purchase into the portal vein. Venogram confirmed location. Amplatz wire was then passed into the splenic vein  through the Glidewire. The next step of dilating the soft tissue tract with series of balloons was quite difficult given the density of the liver. Initially a 4 mm by 40 mm balloon was successful passing into the portal vein with dilation of the soft tissue tract. Even after 4 mm balloon angioplasty, the sheath would not advanced over the Amplatz wire and balloon into the portal system, encountering resistance at the wall of the portal vein. 5 mm balloon angioplasty was then performed, with a similar resistance to passing the sheath into the portal vein. We attempted to pass the sheath with the coaxial dilator/introducer which was unsuccessful. 6 mm balloon angioplasty along the length of the tissue tract was ultimately successful, only after several reposition of the Amplatz wire into the distal splenic vein with the use of the glide catheter. Once the tip sheath was in the portal system, we confirmed location with angiogram. We selected a 10 mm x 2 x 8 viator stent, which was deployed and post dilated to 10 mm. Venogram confirm excellent flow through the TIPS shunt. Pigtail catheter was then advanced into the splenic vein with repeat portal venogram. This demonstrated significant perfusion of the gastroesophageal network via left gastric, posterior gastric, and accessory posterior gastric contribution. We proceeded with embolization. Angled multipurpose catheter was used to select the largest of the posterior gastric veins. Coil embolization then proceeded with fibered 035 interlock coils, using multiple 10 mm diameter, 8 mm diameter, and 6 mm diameter coils. Once the flow was static in the first posterior gastric vein contribution, the catheter was withdrawn and repeat venogram was performed demonstrating accessory posterior gastric vein. This vein was selected with the 035 multipurpose catheter and coil embolized with fibered interlock coils, as well as a Amplatzer 4 plug. Repeat splenic vein/portal vein  venogram demonstrated occlusion of enlarged short gastric vein after the sclerotherapy treatment, with persisting left gastric vein contribution to the gastroesophageal network. The multipurpose catheter was then used to select these left gastric vein contributors, which were then coil embolized/plug embolized with a combination of fibrin coils and Amplatzer 4. Final angiogram was performed. The access in the right portal system was removed. Access in the right internal jugular vein was removed with hemostasis achieved. Sterile bandage was placed. Patient tolerated the procedure well and remained hemodynamically stable throughout. No complications were encountered and no significant blood loss. FINDINGS: Ultrasound guided access right internal jugular vein demonstrates wide patency. Creation of the portal systemic shunt demonstrated a significantly dense liver parenchyma, of which even the colapinto needle had difficulty crossing. Excellent flow through the TIPS shunt after creation, with 10 mm by 2 x 8 stent placed, dilated to 10 mm. Multiple portal contribution identified from the portal and splenic vein including posterior gastric, accessory posterior gastric, left gastric, and accessory left gastric venous inflow into a persisting gastroesophageal varices network. Each of these was coil embolized/plug embolized to stasis with no persisting flow into the network upon completion. The preferential portal venous contribution that was treated during the balloon occlusion retrograde trans venous ablation on Sunday was a large short gastric vein which remains occluded on today's study. Persisting changes of prior sclerotherapy in the gastric variceal system. IMPRESSION: Status post TIPS creation and treatment of persisting portal contribution to gastroesophageal variceal network by combination of coil and plug embolization  to stasis. Signed, Dulcy Fanny. Dellia Nims, R[facility] Special[facility]  [facility] Radiology Electronically Signed   By: Corrie Mckusick D.O.   On: 11/25/2019 13:21   IR US Guide Vasc Access Right  Result Date: 11/22/2019 CLINICAL DATA:  53 year old male with alcoholic cirrhosis and upper at GI hemorrhage, transferred from [facility] with gastric variceal hemorrhage, status post endoscopic identification and clipping. He has continued to have GI blood loss with need for pressor support, and presents now for urgent treatment with BRTO and/or TIPS EXAM: ULTRASOUND GUIDED ACCESS RIGHT COMMON FEMORAL VEIN LEFT RENAL VEIN ANGIOGRAM RETROGRADE SCLEROTHERAPY OF GASTRIC VARICES, ASSISTED WITH COIL EMBOLIZATION MEDICATIONS: As antibiotic prophylaxis, 2 G ANCEF was ordered pre-procedure and administered intravenously within one hour of incision. One unit of platelets was also administered IV during the procedure. ANESTHESIA/SEDATION: General - as administered by the Anesthesia department CONTRAST:  90 cc IV contrast FLUOROSCOPY TIME:  Fluoroscopy Time: 19 minutes 42 seconds (12/29/2041 mGy). COMPLICATIONS: None PROCEDURE: Informed written consent was obtained from the patient and the patient's family after a thorough discussion of the procedural risks, benefits and alternatives. Specific risks that were addressed regarding BRTO include, bleeding, infection, contrast reaction, kidney injury, need for further procedure or surgery including TIPS and/ or endoscopy, pulmonary embolism, stroke, worsening of ascites, worsening of esophageal varices, paradoxical liver failure, varix rupture, cardiopulmonary collapse, death. All questions were addressed. Maximal Sterile Barrier Technique was utilized including caps, mask, sterile gowns, sterile gloves, sterile drape, hand hygiene and skin antiseptic. A timeout was performed prior to the initiation of the procedure. Ultrasound survey of the right inguinal region was performed with images stored and sent to PACs. A single wall puncture needle  was used access the right common femoral artery under ultrasound guidance with saline syringe. With venous blood flow returned, a Bentson wire was observed to enter the common femoral vein and the IVC under fluoroscopy. The needle was removed, and a small incision was made, and 9 [place] dilator was passed over the Bentson wire. A 10 French TIPS sheath was then passed over the Bentson wire after manually creating a significant leftward current on the catheter. The inner dilator and wire were removed, and a 5 [place] cobra catheter was passed over the Bentson wire into the IVC. Cobra catheter was used to select the left renal vein orifice. Combination of the Bentson wire, Glidewire, Rosen wire were used to gain purchased into the renal vein, with renal vein limited venogram performed to identify the in flowing gastro renal shunt. With a safety curve on a stiff Amplatz wire position into superior pole renal vein, the TIPS sheath was advanced into the left renal vein over the Cobra catheter. Pullback reinforced straight sheath selection technique (PRESSS) was performed, and as the tip sheath was withdrawn, contrast was used to identified the inflow of the gastro renal shunt. With the coaxial Amplatz wire in position, a combination of a standard Kumpe catheter and a 4 French angled glide catheter were used to cannulate the orifice of the gastro renal shunt flowing shunt opposite the adrenal vein inflow. Once the Glidewire was advanced into the shunt, the catheter was advanced, and the Glidewire was exchanged for a stiff Amplatz wire with a safety curve on the tip. The renal vein buddy wire was withdrawn, and the TIPS sheath was advanced into the gastro renal shunt. As the 10 [place] TIPS sheath was clearly not long enough at 35 cm, a 45 cm 12 French sheath was  placed. This sheath was then advanced into the most inferior aspects of the shunt orifice into the renal vein, though would not advance further given the length.  Once the glide catheter and the Glidewire were in a reasonable location within the shunt, the catheter was withdrawn and then a balloon occlusion catheter was advanced over the Amplatz wire into the gastro renal shunt. Amplatz wire was then withdrawn. Angiogram was then performed to confirmed location within the gastro renal shunt outflow. Initial balloon inflation did not achieve stasis of the shunt, and the balloon was deflated with repositioning needed. There was difficulty passing the [place] balloon catheter into a more distal position of the shunt, likely secondary to tortuosity and/or venous web. Exchange for an 8 [place] Merci balloon, 95 cm, was performed on an Amplatz wire. A more distal position within the shunt was achieved. Balloon on the [facility] was inflated on the balloon occlusion catheter and was withdrawn to the first encountered web to achieve hemostasis. Angiogram confirmed stasis of flow. A CO2 angiogram was also performed, identifying the posterior gastric veins as the stopping point. A penumbra Lantern microcatheter and a fathom wire were navigated into the shunt. Once we confirmed location within the outflow with standard angiogram, treatment was initiated. A foamed solution of detergent/sclerosant was mixed in a standard Tessari method, with 1:2:3 ratio of ethiodized oil: sodium tetradecyl sulfate: Room air. The sclerosed sent was infused under fluoroscopic guidance, with observation of reflux into the afferent veins. Once reflux was identified into the posterior gastric vein, sclerosing was completed. This required only approximately 15 cc of foam sclerotherapy in the above ratio. Once the posterior gastric veins were identified, we stop treatment. The micro catheter was withdrawn to the tip of the balloon occlusion catheter, and coil mass was deposited with deployment of multiple Ruby coils. This was performed with 24 by 60 cm coil, 28 x 60 cm coil, and 3 by 32 x 60 cm coil. The  balloon was deflated under fluoroscopy, with no motion identified. The Merci balloon was slightly withdrawn into the more distal shunt (closer to the renal vein) and angiogram was performed confirming complete stasis of flow beyond the coil mass. Once we were confident there was no migration, balloon was withdrawn and the sheath was withdrawn. Hemostasis was achieved with manual compression. Patient remained hemodynamically stable throughout. No complications were encountered and no significant blood loss was encountered. IMPRESSION: Status post ultrasound guided access right common femoral vein for coil-assisted BRTO (sclerotherapy) as treatment for life-threatening gastric variceal hemorrhage. Signed, Dulcy Fanny. Dellia Nims, [place] [facility] Radiology Plan PLAN: Observe in PACU, then returned ICU Continue current management including serial H and H Local wound care management at the common femoral vein access site Repeat CT angiogram abdomen/pelvis after 24 hours-48 hours on this admission Follow-up vascular Interventional Radiology Clinic in 4-8 weeks Electronically Signed   By: Corrie Mckusick D.O.   On: 11/22/2019 14:32   DG Chest Port 1 View  Result Date: 11/24/2019 CLINICAL DATA:  Abnormal breath sounds EXAM: PORTABLE CHEST 1 VIEW COMPARISON:  Chest x-ray dated 11/22/2011 FINDINGS: The right-sided central venous catheter is stable in positioning. The heart size is enlarged. There are new small bilateral pleural effusions. There are new bibasilar airspace opacities favored to represent postoperative atelectasis. IMPRESSION: 1. New small bilateral pleural effusions and bibasilar airspace opacities favored to represent postoperative atelectasis. 2. Stable right central venous catheter. Electronically Signed   By: Jamie Kato.D.  On: 11/24/2019 21:39   IR EMBO ART  VEN HEMORR LYMPH EXTRAV  INC GUIDE ROADMAPPING  Result Date: 11/25/2019 CLINICAL DATA:   53 year old male with a history of life threatening of bleeding secondary to portal hypertension and gastric varices. The patient is status post balloon occlusion retrograde trans venous obliteration (BRTO) on 11/22/2019, with recurrent hemorrhage confirmed by upper endoscopy EXAM: ULTRASOUND-GUIDED TRANS HEPATIC PORTAL VEIN ACCESS ULTRASOUND GUIDED ACCESS RIGHT JUGULAR VEIN. TIPS PLACEMENT COIL AND PLUG EMBOLIZATION OF MULTIPLE PORTAL VEIN TRIBUTARIES TO A PERSISTING ESOPHAGOGASTRIC VARICEAL NETWORK MEDICATIONS: As antibiotic prophylaxis, 2 G ANCEF was ordered pre-procedure and administered intravenously within one hour of incision. One unit of platelets was also administered IV during the procedure. ANESTHESIA/SEDATION: General - as administered by the Anesthesia department Vascular Interventional Radiology physicians: Dr. Earleen Newport, Dr. Kathlene Cote, Dr. Laurence Ferrari CONTRAST:  81m OMNIPAQUE IOHEXOL 300 MG/ML SOLN, 769mOMNIPAQUE IOHEXOL 300 MG/ML SOLN, 7039mMNIPAQUE IOHEXOL 300 MG/ML SOLN FLUOROSCOPY TIME:  Fluoroscopy Time: 66 minutes 36 seconds (6280 mGy). COMPLICATIONS: None PROCEDURE: Informed written consent was obtained from the patient's family after a thorough discussion of the procedural risks, benefits and alternatives. Specific risks discussed with TIPS/variceal embolization included: Bleeding, infection, vascular injury, need for further procedure/surgery, renal injury/renal failure, contrast reaction, non-target embolization, liver dysfunction/failure, hepatic encephalopathy, stroke (~1%), cardiopulmonary collapse, death. All questions were addressed. Maximal Sterile Barrier Technique was utilized including caps, mask, sterile gowns, sterile gloves, sterile drape, hand hygiene and skin antiseptic. A timeout was performed prior to the initiation of the procedure. Patient was positioned supine position on the table, with the right upper quadrant and the right neck prepped and draped in the usual sterile  fashion. Ultrasound survey of the right upper quadrant was performed, with images stored and sent to PACs. Using ultrasound guidance, Chiba needle was used access the right portal system. Once we confirmed position in the portal system with portal venography, micro wire was advanced into the inferior mesenteric vein. The inner dilator was advanced over the metal stiffener into the portal system, with the inner dilator advanced over the wire. Venogram confirmed are location within the portal system. The micro wire was then replaced into the inner dilator, with the transition point position at the apex of the catheter. Check flow vial was placed on the back and of the inner dilator. Ultrasound survey was then performed of the right neck, with images stored and sent to PACs. Ultrasound guidance was then used to access the right internal jugular vein with a micro puncture kit. The wire was advanced under fluoroscopy into the right atrium, and a small incision was made. The needle was removed and the dilator was placed. The micro wire in the stiffener were removed and an 035 wire was then passed into the inferior vena cava. Ten French soft tissue dilation was performed on the wire, and then 10 [place] sheath was placed. Combination of [place] wire and multipurpose angled catheter were used to select the right hepatic vein. During the selection the right hepatic vein, accessory right hepatic vein was accessed with venography. Venogram confirmed location within the right hepatic vein. Tip sheath was then advanced over the catheter with a coaxial Amplatz wire, for a position cephalad to the transition point of the wire in the portal venous system. The [place] needle was then advanced through the TIPS sheath housed in the Teflon sheath over the Amplatz wire. Using oblique projections, only a single pass was required to access the portal venous system. Of note, the liver  was very dense, with difficulty penetrating the  portal vein wall with the colapinto needle. Access into the portal venous system was confirmed by aspirating portal venous blood and a venogram. A stiff Glidewire was then used to navigate through the portal vein, ultimately with purchase in the IMV. Needle was removed. The stock 5 French glide catheter was attempted to pass over the Glidewire into the portal system. The tip would not penetrate the portal vein wall. A 4 French straight glide cath was then advanced on the Glidewire, and with forward tension, ultimately the glide cath penetrated the portal vein wall, with good purchase into the portal vein. Venogram confirmed location. Amplatz wire was then passed into the splenic vein through the Glidewire. The next step of dilating the soft tissue tract with series of balloons was quite difficult given the density of the liver. Initially a 4 mm by 40 mm balloon was successful passing into the portal vein with dilation of the soft tissue tract. Even after 4 mm balloon angioplasty, the sheath would not advanced over the Amplatz wire and balloon into the portal system, encountering resistance at the wall of the portal vein. 5 mm balloon angioplasty was then performed, with a similar resistance to passing the sheath into the portal vein. We attempted to pass the sheath with the coaxial dilator/introducer which was unsuccessful. 6 mm balloon angioplasty along the length of the tissue tract was ultimately successful, only after several reposition of the Amplatz wire into the distal splenic vein with the use of the glide catheter. Once the tip sheath was in the portal system, we confirmed location with angiogram. We selected a 10 mm x 2 x 8 viator stent, which was deployed and post dilated to 10 mm. Venogram confirm excellent flow through the TIPS shunt. Pigtail catheter was then advanced into the splenic vein with repeat portal venogram. This demonstrated significant perfusion of the gastroesophageal network via left  gastric, posterior gastric, and accessory posterior gastric contribution. We proceeded with embolization. Angled multipurpose catheter was used to select the largest of the posterior gastric veins. Coil embolization then proceeded with fibered 035 interlock coils, using multiple 10 mm diameter, 8 mm diameter, and 6 mm diameter coils. Once the flow was static in the first posterior gastric vein contribution, the catheter was withdrawn and repeat venogram was performed demonstrating accessory posterior gastric vein. This vein was selected with the 035 multipurpose catheter and coil embolized with fibered interlock coils, as well as a Amplatzer 4 plug. Repeat splenic vein/portal vein venogram demonstrated occlusion of enlarged short gastric vein after the sclerotherapy treatment, with persisting left gastric vein contribution to the gastroesophageal network. The multipurpose catheter was then used to select these left gastric vein contributors, which were then coil embolized/plug embolized with a combination of fibrin coils and Amplatzer 4. Final angiogram was performed. The access in the right portal system was removed. Access in the right internal jugular vein was removed with hemostasis achieved. Sterile bandage was placed. Patient tolerated the procedure well and remained hemodynamically stable throughout. No complications were encountered and no significant blood loss. FINDINGS: Ultrasound guided access right internal jugular vein demonstrates wide patency. Creation of the portal systemic shunt demonstrated a significantly dense liver parenchyma, of which even the colapinto needle had difficulty crossing. Excellent flow through the TIPS shunt after creation, with 10 mm by 2 x 8 stent placed, dilated to 10 mm. Multiple portal contribution identified from the portal and splenic vein including posterior gastric, accessory posterior gastric, left  gastric, and accessory left gastric venous inflow into a persisting  gastroesophageal varices network. Each of these was coil embolized/plug embolized to stasis with no persisting flow into the network upon completion. The preferential portal venous contribution that was treated during the balloon occlusion retrograde trans venous ablation on Sunday was a large short gastric vein which remains occluded on today's study. Persisting changes of prior sclerotherapy in the gastric variceal system. IMPRESSION: Status post TIPS creation and treatment of persisting portal contribution to gastroesophageal variceal network by combination of coil and plug embolization to stasis. Signed, Dulcy Fanny. Dellia Nims, [facility] Interventional Radiology Specialists [facility] Radiology Electronically Signed   By: Corrie Mckusick D.O.   On: 11/25/2019 13:21   IR EMBO ART  VEN HEMORR LYMPH EXTRAV  INC GUIDE ROADMAPPING  Result Date: 11/22/2019 CLINICAL DATA:  53 year old male with alcoholic cirrhosis and upper at GI hemorrhage, transferred from [facility] with gastric variceal hemorrhage, status post endoscopic identification and clipping. He has continued to have GI blood loss with need for pressor support, and presents now for urgent treatment with BRTO and/or TIPS EXAM: ULTRASOUND GUIDED ACCESS RIGHT COMMON FEMORAL VEIN LEFT RENAL VEIN ANGIOGRAM RETROGRADE SCLEROTHERAPY OF GASTRIC VARICES, ASSISTED WITH COIL EMBOLIZATION MEDICATIONS: As antibiotic prophylaxis, 2 G ANCEF was ordered pre-procedure and administered intravenously within one hour of incision. One unit of platelets was also administered IV during the procedure. ANESTHESIA/SEDATION: General - as administered by the Anesthesia department CONTRAST:  90 cc IV contrast FLUOROSCOPY TIME:  Fluoroscopy Time: 19 minutes 42 seconds (12/29/2041 mGy). COMPLICATIONS: None PROCEDURE: Informed written consent was obtained from the patient and the patient's family after a thorough discussion of the procedural risks, benefits and  alternatives. Specific risks that were addressed regarding BRTO include, bleeding, infection, contrast reaction, kidney injury, need for further procedure or surgery including TIPS and/ or endoscopy, pulmonary embolism, stroke, worsening of ascites, worsening of esophageal varices, paradoxical liver failure, varix rupture, cardiopulmonary collapse, death. All questions were addressed. Maximal Sterile Barrier Technique was utilized including caps, mask, sterile gowns, sterile gloves, sterile drape, hand hygiene and skin antiseptic. A timeout was performed prior to the initiation of the procedure. Ultrasound survey of the right inguinal region was performed with images stored and sent to PACs. A single wall puncture needle was used access the right common femoral artery under ultrasound guidance with saline syringe. With venous blood flow returned, a Bentson wire was observed to enter the common femoral vein and the IVC under fluoroscopy. The needle was removed, and a small incision was made, and 9 [place] dilator was passed over the Bentson wire. A 10 French TIPS sheath was then passed over the Bentson wire after manually creating a significant leftward current on the catheter. The inner dilator and wire were removed, and a 5 [place] cobra catheter was passed over the Bentson wire into the IVC. Cobra catheter was used to select the left renal vein orifice. Combination of the Bentson wire, Glidewire, Rosen wire were used to gain purchased into the renal vein, with renal vein limited venogram performed to identify the in flowing gastro renal shunt. With a safety curve on a stiff Amplatz wire position into superior pole renal vein, the TIPS sheath was advanced into the left renal vein over the Cobra catheter. Pullback reinforced straight sheath selection technique (PRESSS) was performed, and as the tip sheath was withdrawn, contrast was used to identified the inflow of the gastro renal shunt. With the coaxial Amplatz wire  in position,  a combination of a standard Kumpe catheter and a 4 French angled glide catheter were used to cannulate the orifice of the gastro renal shunt flowing shunt opposite the adrenal vein inflow. Once the Glidewire was advanced into the shunt, the catheter was advanced, and the Glidewire was exchanged for a stiff Amplatz wire with a safety curve on the tip. The renal vein buddy wire was withdrawn, and the TIPS sheath was advanced into the gastro renal shunt. As the 10 [place] TIPS sheath was clearly not long enough at 35 cm, a 45 cm 12 French sheath was placed. This sheath was then advanced into the most inferior aspects of the shunt orifice into the renal vein, though would not advance further given the length. Once the glide catheter and the Glidewire were in a reasonable location within the shunt, the catheter was withdrawn and then a balloon occlusion catheter was advanced over the Amplatz wire into the gastro renal shunt. Amplatz wire was then withdrawn. Angiogram was then performed to confirmed location within the gastro renal shunt outflow. Initial balloon inflation did not achieve stasis of the shunt, and the balloon was deflated with repositioning needed. There was difficulty passing the [place] balloon catheter into a more distal position of the shunt, likely secondary to tortuosity and/or venous web. Exchange for an 8 [place] Merci balloon, 95 cm, was performed on an Amplatz wire. A more distal position within the shunt was achieved. Balloon on the [facility] was inflated on the balloon occlusion catheter and was withdrawn to the first encountered web to achieve hemostasis. Angiogram confirmed stasis of flow. A CO2 angiogram was also performed, identifying the posterior gastric veins as the stopping point. A penumbra Lantern microcatheter and a fathom wire were navigated into the shunt. Once we confirmed location within the outflow with standard angiogram, treatment was initiated. A foamed  solution of detergent/sclerosant was mixed in a standard Tessari method, with 1:2:3 ratio of ethiodized oil: sodium tetradecyl sulfate: Room air. The sclerosed sent was infused under fluoroscopic guidance, with observation of reflux into the afferent veins. Once reflux was identified into the posterior gastric vein, sclerosing was completed. This required only approximately 15 cc of foam sclerotherapy in the above ratio. Once the posterior gastric veins were identified, we stop treatment. The micro catheter was withdrawn to the tip of the balloon occlusion catheter, and coil mass was deposited with deployment of multiple Ruby coils. This was performed with 24 by 60 cm coil, 28 x 60 cm coil, and 3 by 32 x 60 cm coil. The balloon was deflated under fluoroscopy, with no motion identified. The Merci balloon was slightly withdrawn into the more distal shunt (closer to the renal vein) and angiogram was performed confirming complete stasis of flow beyond the coil mass. Once we were confident there was no migration, balloon was withdrawn and the sheath was withdrawn. Hemostasis was achieved with manual compression. Patient remained hemodynamically stable throughout. No complications were encountered and no significant blood loss was encountered. IMPRESSION: Status post ultrasound guided access right common femoral vein for coil-assisted BRTO (sclerotherapy) as treatment for life-threatening gastric variceal hemorrhage. Signed, Dulcy Fanny. Dellia Nims, [place] [facility] Radiology Plan PLAN: Observe in PACU, then returned ICU Continue current management including serial H and H Local wound care management at the common femoral vein access site Repeat CT angiogram abdomen/pelvis after 24 hours-48 hours on this admission Follow-up vascular Interventional Radiology Clinic in 4-8 weeks Electronically Signed   By: York Cerise  Earleen Newport D.O.   On: 11/22/2019 14:32   CT Angio Abd/Pel w/ and/or  w/o  Result Date: 11/24/2019 CLINICAL DATA:  Status post BRTO, GI bleeding EXAM: CTA ABDOMEN AND PELVIS WITH CONTRAST TECHNIQUE: Multidetector CT imaging of the abdomen and pelvis was performed using the standard protocol during bolus administration of intravenous contrast. Multiplanar reconstructed images and MIPs were obtained and reviewed to evaluate the vascular anatomy. CONTRAST:  119m OMNIPAQUE IOHEXOL 350 MG/ML SOLN COMPARISON:  11/22/2019 FINDINGS: VASCULAR Aorta: Aorta atherosclerotic without significant aneurysm, dissection or occlusive process. No retroperitoneal hemorrhage or hematoma. Celiac: Main celiac origin is widely patent including its splenic and right hepatic branches. Separate origin of the left gastric and left hepatic artery also appearing patent. SMA: SMA origin is patent. Renals: Main renal arteries are widely patent. IMA: Small caliber IMA remains patent off the aorta. Inflow: Iliac atherosclerosis noted without significant inflow disease or occlusion. Proximal Outflow: Atherosclerotic changes of the proximal outflow. The right common femoral and proximal profunda femoral artery are both patent. Right proximal SFA occlusion noted. Left common femoral, profunda femoral, proximal SFA visualized are all patent. Veins: Dedicated venous imaging not performed. Interval balloon retrograde trans obliteration of the gastric varices. Gastric varices appear largely occluded with radiopaque sclerotherapy and coils noted. Scattered areas non target radiopaque sclerotherapy within small branches of the short gastric veins about the spleen in the left upper quadrant. Overall limited assessment because venous phase imaging was not performed. Review of the MIP images confirms the above findings. NON-VASCULAR Lower chest: Increased dependent basilar atelectasis and very small left effusion. Normal heart size. No pericardial effusion. Native coronary atherosclerosis. Hepatobiliary: Hepatic cirrhosis  again evident with heterogeneous attenuation and micro nodularity to the surface. Limited assessment because of arterial phase imaging only. Small left hepatic dome subcentimeter probable cyst noted. Increased perihepatic ascites. Moderate gallbladder distension with layering gallstones again evident. No gross biliary obstruction or dilatation. Common bile duct nondilated. Pancreas: Unremarkable. No pancreatic ductal dilatation or surrounding inflammatory changes. Spleen: Stable mild splenomegaly.  Increased perisplenic ascites. Adrenals/Urinary Tract: Normal adrenal glands. No renal obstruction or hydronephrosis. No hydroureter or ureteral calculus. Bladder unremarkable. Stomach/Bowel: Negative for bowel obstruction, significant dilatation ileus or free air. Portions of the appendix are demonstrated and appear unremarkable. Increased stranding mesenteric edema. Exam of the bowel is limited but there is slight diffuse colonic bowel wall thickening suggesting mild colitis. New small amount abdominopelvic ascites. Lymphatic: No bulky adenopathy. Reproductive: No acute or significant finding by CT. Other: No inguinal or abdominal wall hernia. Increased mild diffuse lower body wall pelvic subcutaneous edema compatible with anasarca. Musculoskeletal: No acute osseous finding. Bilateral L5 pars defects noted. No anterolisthesis associated. Acute compression fracture. IMPRESSION: VASCULAR No evidence of significant CTA arterial GI bleeding. 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 Small amount of scattered non target radiopaque sclerotherapy within the perisplenic short gastric veins noted. Aortoiliac atherosclerosis without aneurysm or occlusive process Mesenteric and renal vasculature appear patent centrally. Chronic appearing proximal right SFA occlusion NON-VASCULAR Minor basilar atelectasis and trace left effusion Hepatic cirrhosis with developing mesenteric edema, new small  amount of abdominopelvic ascites, and body anasarca. Cholelithiasis Nonspecific mild colonic wall thickening. Difficult to exclude associated pancolitis. Electronically Signed   By: MJerilynn Mages  Shick M.D.   On: 11/24/2019 13:09    Labs:  CBC: Recent Labs    11/24/19 1120 11/25/19 0343 11/25/19 0800 11/25/19 1200 11/25/19 1750 11/26/19 0347  WBC 14.4* 18.3*  --   --  13.4* 10.3  HGB 6.3* 8.4* 8.0* 7.8* 7.7* 7.9*  HCT 18.5* 24.9* 23.6* 23.0* 23.6* 23.8*  PLT 117* 105*  --   --  105* 92*    COAGS: Recent Labs    08/19/19 1304 11/19/19 2208 11/22/19 2304 11/25/19 1200  INR 1.1 1.3* 1.8* 1.6*  APTT  --  41*  --   --     BMP: Recent Labs    11/23/19 0829 11/24/19 0554 11/24/19 1717 11/24/19 1940 11/25/19 0343 11/26/19 0347  NA 141 140 141 141 142 141  K 4.0 4.2 5.6* 5.5* 5.0 4.0  CL 109 110  --   --  110 108  CO2 25 25  --   --  25 29  GLUCOSE 121* 156*  --   --  169* 159*  BUN 15 15  --   --  20 19  CALCIUM 6.5* 6.6*  --   --  6.7* 7.0*  CREATININE 0.90 1.12  --   --  1.15 0.83  GFRNONAA >60 >60  --   --  >60 >60  GFRAA >60 >60  --   --  >60 >60    LIVER FUNCTION TESTS: Recent Labs    11/22/19 0712 11/23/19 0829 11/25/19 0343 11/26/19 0347  BILITOT 5.8* 4.0* 7.5* 5.5*  AST 71* 77* 102* 208*  ALT 20 25 33 77*  ALKPHOS 104 85 67 80  PROT 4.6* 4.2* 4.1* 4.1*  ALBUMIN 1.7* 1.8* 1.8* 1.8*    Assessment and Plan:  Gastric varices BRTO 11/22/19 TIPS 11/24/19 Minimal BRBPR this am No further transfusions since 12/15 Will follow Plan per CCM/GI  Electronically Signed: Lavonia Drafts, PA-C 11/26/2019, 9:34 AM   I spent a total of 15 Minutes at the the patient's bedside AND on the patient's hospital floor or unit, greater than 50% of which was counseling/coordinating care for BRTO 12/13; TIPs 12/15

## 2019-11-26 NOTE — Progress Notes (Signed)
ICU Transitions of Care Pharmacist Intervention    Bradley Miles is a 53 y.o. male admitted on 11/19/2019  from home, and has been in the intensive care unit for 5d 4h  Medical History: Past Medical History:  Diagnosis Date  . Anxiety   . Enlarged liver   . GERD (gastroesophageal reflux disease)   . Hypertension   . Palpitations   . PVC's (premature ventricular contractions)   . Shortness of breath   . Varicose veins      Home Medication List:  Medications Prior to Admission  Medication Sig Dispense Refill Last Dose  . acebutolol (SECTRAL) 400 MG capsule Take 1 capsule (400 mg total) by mouth 2 (two) times daily. 180 capsule 3 Past Week at Unknown time  . allopurinol (ZYLOPRIM) 100 MG tablet TAKE 1 TABLET BY MOUTH EVERY DAY (Patient taking differently: Take 100 mg by mouth. ) 30 tablet 5 Past Week at Unknown time  . aspirin EC 81 MG tablet Take 81 mg by mouth daily.   Past Week at Unknown time  . cholecalciferol (VITAMIN D3) 25 MCG (1000 UT) tablet Take 1,000 Units by mouth daily.   Past Week at Unknown time  . DEXILANT 60 MG capsule TAKE 1 CAPSULE BY MOUTH EVERY DAY (Patient taking differently: Take 60 mg by mouth daily. ) 30 capsule 3 Past Week at Unknown time  . famotidine (PEPCID) 10 MG tablet Take 10 mg by mouth as needed for heartburn or indigestion.   Past Week at Unknown time  . folic acid (FOLVITE) 308 MCG tablet Take 400 mcg by mouth daily.   Past Week at Unknown time  . gabapentin (NEURONTIN) 300 MG capsule TAKE 1 CAPSULE BY MOUTH FOUR TIMES DAILY (Patient taking differently: Take 800 mg by mouth at bedtime. ) 360 capsule 0 Past Week at Unknown time  . lactulose, encephalopathy, (CHRONULAC) 10 GM/15ML SOLN Take 15-30cc three times daily to Titrate for 3-4 soft bowel movements a day (Patient taking differently: Take 15-30cc three times daily to Titrate for 3-4 soft bowel movements a day (sometimes forgets mid day dose)) 1892 mL 11 Past Week at Unknown time  . LORazepam  (ATIVAN) 1 MG tablet Take 0.5 tablets (0.5 mg total) by mouth 2 (two) times daily as needed. for anxiety (Patient taking differently: Take 1 mg by mouth 2 (two) times daily as needed for anxiety. for anxiety) 30 tablet 0 Past Week at Unknown time  . nortriptyline (PAMELOR) 50 MG capsule TAKE 1 CAPSULE(50 MG) BY MOUTH AT BEDTIME 30 capsule 5 11/18/2019 at Unknown time  . Omega-3 Fatty Acids (FISH OIL PO) Take 3 capsules by mouth as needed.    11/18/2019 at Unknown time  . ondansetron (ZOFRAN-ODT) 4 MG disintegrating tablet DISSOLVE 1 TABLET ON THE TONGUE EVERY 8 HOURS AS NEEDED FOR NAUSEA 24 tablet 2 Past Week at Unknown time  . PARoxetine (PAXIL) 20 MG tablet TAKE 1 TABLET(20 MG) BY MOUTH DAILY 30 tablet 5 Past Week at Unknown time  . sildenafil (VIAGRA) 100 MG tablet TAKE 1/2 TABLET BY MOUTH EVERY DAY 3 tablet 3 Past Week at Unknown time  . tobramycin-dexamethasone (TOBRADEX) ophthalmic solution INSTILL 1 DROP INTO THE AFFECTED EYE TID FOR 5 DAYS   Past Week at Unknown time  . vitamin B-12 (CYANOCOBALAMIN) 1000 MCG tablet Take 1,000 mcg by mouth daily.   Past Week at Unknown time     Has a pharmacist made an intervention on this patient's medication list?  yes, because the patient  stayed in medical-surgical ICU for > 48 hours, is discharging from ICU to another level of care within [facility], and has active orders for medications that should be discontinued upon transition out of ICU.    Transitions of care were discussed with Dr Lynetta Mare and the following interventions were made:  Stress Ulcer Prophylaxis:  A medication for stress ulcer prophylaxis was started during ICU admission and not intended to be continued at discharge. Pantoprazole was changed from IV to PO. Patient needs to continue d/t GI Bleed  Antibiotics: Antibiotic was started during ICU admission and not intended to be continued at discharge. Antibiotics no longer indicated, therefore Ceftriaxone was discontinued  Barth Kirks,  PharmD, BCPS, BCCCP Clinical Pharmacist [phone]  Please check AMION for all [facility] numbers  11/26/2019 9:06 AM

## 2019-11-26 NOTE — Progress Notes (Signed)
Arrived to room 6n5 from 2MW at this time.

## 2019-11-26 NOTE — Progress Notes (Signed)
NAME:  Bradley Miles, MRN:  [id number], DOB:  [birth year], LOS: 6 ADMISSION DATE:  11/19/2019, CONSULTATION DATE: 11/21/2019 REFERRING MD: Dr. Tamela Gammon, CHIEF COMPLAINT: Esophageal varices  Brief History   53 y.o. M with PMH of ETOH abuse, COPD, cirrhosis and portal hypertension who presented to [facility] with coffee ground emesis on 12/10.  He was admitted and seen by GI, underwent EGD which showed new varices and erosive gastritis.  He was treated with octreotide and PPI. Had episode of large volume hematemesis with clots during admission and was transferred to the ICU, transferred to [facility] for IR consult.   History of present illness   Bradley Miles is a 53 y.o. M with PMH of heavy ETOH abuse, cirrhosis, HTN, and portal HTN who presented to the ED with coffee ground emesis and was found to have erosive gastritis and new oozing esophageal varices  S/p clipping x1.  Overnight 12/12, he developed large volume bright red hematemesis and was transferred to the ICU.  BP responded to volume resuscitation and given 1 unit PRBC's.  He has since had multiple episodes of maroon stool and so was transferred to [facility] ICU for CTA and IR consult.   On admission had some episodes of hypotension, hb down to 70s.  C/o mild, diffuse abdominal pain and anxiety.   On 12/15 - ongoing melena, hematochezia, hypotension.  EGD done: Diminutive grade 1 varices found in lower third of esophagus. Red blood found in cardia and fundus.   Fresh blood and clots noted.  Despite suctioning and lavage, ongoing active bleeding noted, obscuring mucosal visualization.  S/p TIPS, with embolization of gastric variceal supply  Past Medical History  Anxiety, Enlarged liver, GERD (gastroesophageal reflux disease), Hypertension, Palpitations, PVC's (premature ventricular contractions), Shortness of breath, and Varicose veins.  Significant Hospital Events   12/11 Admit to hospitalists at [facility] 12/12 Hematemesis, txfr to [facility] ICU  then to [place]:  GI, IR, PCCM  Procedures:  12/11 upper endoscopy 12/13 BRTO 12/15 TIPS   Significant Diagnostic Tests:  12/11 EGD > severe erosive gastritis, new varices  Micro Data:  12/11 Sars-Cov-2 > negative  Antimicrobials:  Erythromycin 12/11 only Ceftriaxone 12/11 >   Interim history/subjective:    Objective   Blood pressure 134/75, pulse (!) 112, temperature 98.1 F (36.7 C), temperature source Oral, resp. rate 14, height 6\' 1"  (1.854 m), weight 119 kg, SpO2 94 %.        Intake/Output Summary (Last 24 hours) at 11/26/2019 0850 Last data filed at 11/26/2019 0800 Gross per 24 hour  Intake 651.85 ml  Output 2275 ml  Net -1623.15 ml   Filed Weights   11/19/19 2037 11/21/19 2000  Weight: 117.9 kg 119 kg    Examination: General: Chronically adult male, in NAD HEENT: [place]/AT, MM pink/moist, PERRL, sclera icteric  Neuro: Alert and oriented, able to follow all commands CV: s1s2 regular rate and rhythm, no murmur, rubs, or gallops,  PULM:  Clear to ascultation bilaterally, no increased work of breathing GI: soft, bowel sounds active in all 4 quadrants, non-tender, mildly distended  Extremities: warm/dry, generalized edema  Skin: no rashes or lesions  Resolved Hospital Problem list     Assessment & Plan:   Was critically ill due to hemorrhagic shock.  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  Some bloody stools to be expected.  Hemoglobin remained stable. Progressive ambulation.  Will complete further 24 hours of octreotide and can advance diet.  Ready for transfer to floor.   Best practice:  Diet: Advance to full diet Pain/Anxiety/Delirium protocol (if indicated): Ativan as needed by CIWA  scale VAP protocol (if indicated): Not applicable DVT prophylaxis: SCD GI prophylaxis: Protonix, octreotide  Glucose control: SSI Mobility: Progressive ambulation Code Status: Full code Family Communication: Discussed with patient, wife at bedside  Disposition: Transfer to floor.  Order reconciliation performed.  Hospitalist service contacted.  Labs   CBC: Recent Labs  Lab 11/24/19 1022 11/24/19 1120 11/25/19 0343 11/25/19 0800 11/25/19 1200 11/25/19 1750 11/26/19 0347  WBC 13.9* 14.4* 18.3*  --   --  13.4* 10.3  NEUTROABS 9.3* 9.7*  --   --   --   --   --   HGB 6.3* 6.3* 8.4* 8.0* 7.8* 7.7* 7.9*  HCT 19.0* 18.5* 24.9* 23.6* 23.0* 23.6* 23.8*  MCV 97.9 97.4 97.3  --   --  100.0 99.6  PLT 113* 117* 105*  --   --  105* 92*    Basic Metabolic Panel: Recent Labs  Lab 11/20/19 0456 11/22/19 0712 11/23/19 0829 11/24/19 0554 11/24/19 1717 11/24/19 1940 11/25/19 0343 11/26/19 0347  NA  --  139 141 140 141 141 142 141  K  --  3.8 4.0 4.2 5.6* 5.5* 5.0 4.0  CL  --  108 109 110  --   --  110 108  CO2  --  20* 25 25  --   --  25 29  GLUCOSE  --  119* 121* 156*  --   --  169* 159*  BUN  --  18 15 15   --   --  20 19  CREATININE  --  0.94 0.90 1.12  --   --  1.15 0.83  CALCIUM  --  6.6* 6.5* 6.6*  --   --  6.7* 7.0*  MG 1.2* 0.8*  --   --   --   --  1.4*  --   PHOS 4.2 2.5  --   --   --   --   --   --    GFR: Estimated Creatinine Clearance: 142.2 mL/min (by C-G formula based on SCr of 0.83 mg/dL). Recent Labs  Lab 11/24/19 1120 11/25/19 0343 11/25/19 1750 11/26/19 0347  WBC 14.4* 18.3* 13.4* 10.3    Liver Function Tests: Recent Labs  Lab 11/19/19 2208 11/22/19 0712 11/23/19 0829 11/25/19 0343 11/26/19 0347  AST 133* 71* 77* 102* 208*  ALT 39 20 25 33 77*  ALKPHOS 254* 104 85 67 80  BILITOT 8.8* 5.8* 4.0* 7.5* 5.5*  PROT 8.3* 4.6* 4.2* 4.1* 4.1*  ALBUMIN 3.1* 1.7* 1.8* 1.8* 1.8*   No results for input(s): LIPASE, AMYLASE in the last 168 hours. No results  for input(s): AMMONIA in the last 168 hours.  ABG    Component Value Date/Time   PHART 7.331 (L) 11/24/2019 1940   PCO2ART 46.8 11/24/2019 1940   PO2ART 151.0 (H) 11/24/2019 1940   HCO3 24.8 11/24/2019 1940   TCO2 26 11/24/2019 1940   ACIDBASEDEF 1.0 11/24/2019 1940   O2SAT 99.0 11/24/2019 1940     Coagulation Profile: Recent Labs  Lab 11/19/19 2208 11/22/19 2304 11/25/19 1200  INR 1.3* 1.8* 1.6*    Cardiac Enzymes: No results for input(s): CKTOTAL, CKMB, CKMBINDEX, TROPONINI in the last 168 hours.  HbA1C:  Hgb A1c MFr Bld  Date/Time Value Ref Range Status  11/21/2019 04:57 AM 5.5 4.8 - 5.6 % Final    Comment:    (NOTE) Pre diabetes:          5.7%-6.4% Diabetes:              >6.4% Glycemic control for   <7.0% adults with diabetes   06/01/2018 07:45 AM 5.5 4.8 - 5.6 % Final    Comment:    (NOTE) Pre diabetes:          5.7%-6.4% Diabetes:              >6.4% Glycemic control for   <7.0% adults with diabetes     CBG: Recent Labs  Lab 11/21/19 2023 11/22/19 0040 11/25/19 0336  GLUCAP 120* 114* 159*   35 minutes spent with greater than 50% of time in counseling and coordination of care.  Kipp Brood, MD [facility] ICU Physician [place]  Pager: [phone] Mobile: [phone] After hours: [phone].  11/26/2019, 9:02 AM      11/26/2019, 8:50 AM

## 2019-11-27 ENCOUNTER — Other Ambulatory Visit: Payer: Self-pay | Admitting: Family Medicine

## 2019-11-27 DIAGNOSIS — I864 Gastric varices: Secondary | ICD-10-CM

## 2019-11-27 LAB — CBC
HCT: 25.3 % — ABNORMAL LOW (ref 39.0–52.0)
Hemoglobin: 8 g/dL — ABNORMAL LOW (ref 13.0–17.0)
MCH: 32.9 pg (ref 26.0–34.0)
MCHC: 31.6 g/dL (ref 30.0–36.0)
MCV: 104.1 fL — ABNORMAL HIGH (ref 80.0–100.0)
Platelets: 104 10*3/uL — ABNORMAL LOW (ref 150–400)
RBC: 2.43 MIL/uL — ABNORMAL LOW (ref 4.22–5.81)
RDW: 22.9 % — ABNORMAL HIGH (ref 11.5–15.5)
WBC: 7.4 10*3/uL (ref 4.0–10.5)
nRBC: 1.4 % — ABNORMAL HIGH (ref 0.0–0.2)

## 2019-11-27 MED ORDER — NADOLOL 40 MG PO TABS
40.0000 mg | ORAL_TABLET | Freq: Every day | ORAL | Status: DC
  Administered 2019-11-27 – 2019-11-29 (×3): 40 mg via ORAL
  Filled 2019-11-27 (×3): qty 1

## 2019-11-27 MED ORDER — SODIUM CHLORIDE 0.9% FLUSH: 10.0000 mL | INTRAVENOUS | Status: DC | PRN

## 2019-11-27 MED ORDER — PROPRANOLOL HCL 20 MG PO TABS: 20.0000 mg | ORAL_TABLET | Freq: Two times a day (BID) | ORAL | Status: DC

## 2019-11-27 MED ORDER — LACTULOSE 10 GM/15ML PO SOLN
20.0000 g | Freq: Two times a day (BID) | ORAL | Status: DC
  Administered 2019-11-27 – 2019-11-29 (×4): 20 g via ORAL
  Filled 2019-11-27 (×4): qty 30

## 2019-11-27 NOTE — Plan of Care (Signed)
  Problem: Health Behavior/Discharge Planning: Goal: Ability to manage health-related needs will improve Outcome: Progressing   

## 2019-11-27 NOTE — Progress Notes (Signed)
PROGRESS NOTE                                                                                                                                                                                                             Patient Demographics:    Bradley Miles, is a 53 y.o. male, DOB - [birth year], EYC:[id number]  Admit date - 11/19/2019   Admitting Physician Oswald Hillock, MD  Outpatient Primary MD for the patient is Luking, Grace Bushy, MD  LOS - 7   Chief Complaint  Patient presents with  . Hematemesis       Brief Narrative    53 y.o.M with PMH of heavy ETOH abuse, cirrhosis, HTN, and portal HTN who presented to the EDwith coffee ground emesis and was found to have erosive gastritis and new oozing esophageal varices S/p clipping x1. Overnight 12/12, he developed large volume bright red hematemesis and was transferred to the ICU. BP responded to volume resuscitation and given 1 unit PRBC's. He has since had multiple episodes of maroon stool and so was transferred to [facility] ICU for CTA and IR consult. On admission to [facility] had some episodes of hypotension,   On 12/15 - ongoing melena, hematochezia, hypotension.  EGD done: Diminutive grade 1 varices found in lower third of esophagus. Red blood found in cardia and fundus.  Fresh blood and clots noted.  Despite suctioning and lavage, ongoing active bleeding noted, obscuring mucosal visualization. S/p TIPS, with embolization of gastric variceal supply   -Has received 11 units PRBC, 3 units FFP, 1 dose Vitamin K   12/11 Admit to hospitalists at [facility] 12/11 upper endoscopy at [facility] >>severe erosive gastritis, new varices 12/12 Hematemesis, txfr to [facility] ICU then to [facility] 12/13 BRTO 12/15 repeat EGD ,grade 1 varices found in lower third of esophagus.Red blood found in cardia and fundus. Fresh blood           and clots noted. Despite suctioning and lavage, ongoing active bleeding noted, obscuring  mucosal visualization. 12/15 TIPS    Subjective:    Bradley Miles today reports having bowel movements, denies any chest pain or dyspnea, denies abdominal pain .    Assessment  & Plan :    Active Problems:   Hematemesis/vomiting blood   Acute blood loss anemia   ETOH abuse   Bleeding esophageal  varices (HCC)   GI bleeding due to gastric varices bleeding due to portal hypertension/cirrhosis -s/p BRTO 12/13  -S/p TIPS and gastric embolization 12/15 -GI input greatly appreciated, -Monitor closely for hepatic encephalopathy given recent TIPS procedure, will increase lactulose to 20 g twice daily, check ammonia level. -Initially on octreotide drip, stopped yesterday. -Will start on propranolol  Liver cirrhosis -Secondary to alcohol abuse -Elevated LFTs secondary to liver cirrhosis -Tinea with lactulose and rifaximin -Check ammonia, INR and CMP in a.m. -Continue with Rocephin for SBP prophylaxis  Hemorrhagic shock/acute blood loss anemia -Secondary to upper GI bleed from gastric varices -Has received 11 units PRBC, 3 units FFP, 1 dose Vitamin K -Monitor hemoglobin closely and transfuse to keep hemoglobin more than 8  Alcohol abuse -Continue with CIWA protocol -Continue thiamine, folate and multivitamins   Hypomagnesemia/hypocalcemia -Monitor  closely and replete as needed    COVID-19 Labs  No results for input(s): DDIMER, FERRITIN, LDH, CRP in the last 72 hours.  Lab Results  Component Value Date   [place] NEGATIVE 11/19/2019   [place] Not Detected 09/17/2019   [place] Not Detected 07/15/2019     Code Status : Full  Family Communication  : None at bedside  Disposition Plan  : Home when stable  Consults  :  PCCM, GI, IR  DVT Prophylaxis  :  SCD  Lab Results  Component Value Date   PLT 104 (L) 11/27/2019    Antibiotics  :    Anti-infectives (From admission, onward)   Start     Dose/Rate Route Frequency Ordered Stop   11/25/19 0915   rifaximin (XIFAXAN) tablet 550 mg     550 mg Oral 2 times daily 11/25/19 0909     11/22/19 1236  ceFAZolin (ANCEF) 2-4 GM/100ML-% IVPB    Note to Pharmacy: Margaretmary Dys   : cabinet override      11/22/19 1236 11/22/19 1424   11/22/19 1045  ceFAZolin (ANCEF) 3 g in dextrose 5 % 50 mL IVPB     3 g 100 mL/hr over 30 Minutes Intravenous To Radiology 11/22/19 1004 11/23/19 1045   11/20/19 1130  erythromycin 355 mg in sodium chloride 0.9 % 100 mL IVPB    Note to Pharmacy: To be given 30 minutes prior to EGD.   3 mg/kg  117.9 kg 100 mL/hr over 60 Minutes Intravenous  Once 11/20/19 1106 11/20/19 1732   11/20/19 0815  cefTRIAXone (ROCEPHIN) 2 g in sodium chloride 0.9 % 100 mL IVPB  Status:  Discontinued     2 g 200 mL/hr over 30 Minutes Intravenous Every 24 hours 11/20/19 0750 11/26/19 0904        Objective:   Vitals:   11/26/19 1600 11/26/19 1700 11/26/19 2100 11/27/19 0508  BP: (!) 150/78 (!) 150/80 (!) 142/70 137/80  Pulse: 99 (!) 105 (!) 109 (!) 105  Resp: 12 18 18 17   Temp:  97.9 F (36.6 C) 98.1 F (36.7 C) 98.2 F (36.8 C)  TempSrc:  Oral Oral Oral  SpO2: 96% 96% 92% 97%  Weight:      Height:        Wt Readings from Last 3 Encounters:  11/21/19 119 kg  10/09/19 122 kg  08/19/19 118 kg     Intake/Output Summary (Last 24 hours) at 11/27/2019 1328 Last data filed at 11/27/2019 0900 Gross per 24 hour  Intake 540.75 ml  Output 627 ml  Net -86.25 ml     Physical Exam  Awake Alert, appropriate, in no  apparent distress ,   Symmetrical Chest wall movement, Good air movement bilaterally, CTAB RRR,No Gallops,Rubs or new Murmurs, No Parasternal Heave +ve B.Sounds, Abd Soft, distended, nontender, no rebound - guarding or rigidity. No Cyanosis, Clubbing , trace edema, No new Rash or bruise      Data Review:    CBC Recent Labs  Lab 11/24/19 1022 11/24/19 1120 11/25/19 0343 11/25/19 0800 11/25/19 1200 11/25/19 1750 11/26/19 0347 11/27/19 0410  WBC 13.9* 14.4*  18.3*  --   --  13.4* 10.3 7.4  HGB 6.3* 6.3* 8.4* 8.0* 7.8* 7.7* 7.9* 8.0*  HCT 19.0* 18.5* 24.9* 23.6* 23.0* 23.6* 23.8* 25.3*  PLT 113* 117* 105*  --   --  105* 92* 104*  MCV 97.9 97.4 97.3  --   --  100.0 99.6 104.1*  MCH 32.5 33.2 32.8  --   --  32.6 33.1 32.9  MCHC 33.2 34.1 33.7  --   --  32.6 33.2 31.6  RDW 22.3* 22.4* 20.5*  --   --  21.9* 22.1* 22.9*  LYMPHSABS 1.3 1.3  --   --   --   --   --   --   MONOABS 2.5* 2.7*  --   --   --   --   --   --   EOSABS 0.3 0.3  --   --   --   --   --   --   BASOSABS 0.1 0.1  --   --   --   --   --   --     Chemistries  Recent Labs  Lab 11/22/19 0712 11/23/19 0829 11/24/19 0554 11/24/19 1717 11/24/19 1940 11/25/19 0343 11/26/19 0347  NA 139 141 140 141 141 142 141  K 3.8 4.0 4.2 5.6* 5.5* 5.0 4.0  CL 108 109 110  --   --  110 108  CO2 20* 25 25  --   --  25 29  GLUCOSE 119* 121* 156*  --   --  169* 159*  BUN 18 15 15   --   --  20 19  CREATININE 0.94 0.90 1.12  --   --  1.15 0.83  CALCIUM 6.6* 6.5* 6.6*  --   --  6.7* 7.0*  MG 0.8*  --   --   --   --  1.4*  --   AST 71* 77*  --   --   --  102* 208*  ALT 20 25  --   --   --  33 77*  ALKPHOS 104 85  --   --   --  67 80  BILITOT 5.8* 4.0*  --   --   --  7.5* 5.5*   ------------------------------------------------------------------------------------------------------------------ No results for input(s): CHOL, HDL, LDLCALC, TRIG, CHOLHDL, LDLDIRECT in the last 72 hours.  Lab Results  Component Value Date   HGBA1C 5.5 11/21/2019   ------------------------------------------------------------------------------------------------------------------ No results for input(s): TSH, T4TOTAL, T3FREE, THYROIDAB in the last 72 hours.  Invalid input(s): FREET3 ------------------------------------------------------------------------------------------------------------------ No results for input(s): VITAMINB12, FOLATE, FERRITIN, TIBC, IRON, RETICCTPCT in the last 72 hours.  Coagulation  profile Recent Labs  Lab 11/22/19 2304 11/25/19 1200  INR 1.8* 1.6*    No results for input(s): DDIMER in the last 72 hours.  Cardiac Enzymes No results for input(s): CKMB, TROPONINI, MYOGLOBIN in the last 168 hours.  Invalid input(s): CK ------------------------------------------------------------------------------------------------------------------    Component Value Date/Time   BNP 63.0 06/01/2018 0831    Inpatient Medications  Scheduled Meds: . sodium chloride   Intravenous Once  . sodium chloride   Intravenous Once  . Chlorhexidine Gluconate Cloth  6 each Topical Daily  . folic acid  1 mg Oral Daily  . lactulose  10 g Oral BID  . multivitamin with minerals  1 tablet Oral Daily  . pantoprazole  40 mg Oral BID  . PARoxetine  20 mg Oral Daily  . rifaximin  550 mg Oral BID  . sucralfate  1 g Oral TID WC & HS  . thiamine  100 mg Oral Daily   Or  . thiamine  100 mg Intravenous Daily   Continuous Infusions: . sodium chloride Stopped (11/22/19 0921)   PRN Meds:.iohexol, metoprolol tartrate, ondansetron **OR** ondansetron (ZOFRAN) IV, sodium chloride flush  Micro Results Recent Results (from the past 240 hour(s))  SARS Coronavirus 2 by RT PCR (hospital order, performed in [place] hospital lab)     Status: None   Collection Time: 11/19/19  9:33 PM  Result Value Ref Range Status   SARS Coronavirus 2 NEGATIVE NEGATIVE Final    Comment: (NOTE) SARS-CoV-2 target nucleic acids are NOT DETECTED. The SARS-CoV-2 RNA is generally detectable in upper and lower respiratory specimens during the acute phase of infection. The lowest concentration of SARS-CoV-2 viral copies this assay can detect is 250 copies / mL. A negative result does not preclude SARS-CoV-2 infection and should not be used as the sole basis for treatment or other patient management decisions.  A negative result may occur with improper specimen collection / handling, submission of specimen other than  nasopharyngeal swab, presence of viral mutation(s) within the areas targeted by this assay, and inadequate number of viral copies (<250 copies / mL). A negative result must be combined with clinical observations, patient history, and epidemiological information. Fact Sheet for Patients:   StrictlyIdeas.no Fact Sheet for Healthcare Providers: BankingDealers.co.za This test is not yet approved or cleared  by the [place] FDA and has been authorized for detection and/or diagnosis of SARS-CoV-2 by FDA under an Emergency Use Authorization (EUA).  This EUA will remain in effect (meaning this test can be used) for the duration of the COVID-19 declaration under Section 564(b)(1) of the Act, 21 U.S.C. section 360bbb-3(b)(1), unless the authorization is terminated or revoked sooner. Performed at [facility], [place] [address] [place], [place] [address]   MRSA PCR Screening     Status: None   Collection Time: 11/21/19  4:43 AM   Specimen: Nasal Mucosa; Nasopharyngeal  Result Value Ref Range Status   MRSA by PCR NEGATIVE NEGATIVE Final    Comment:        The GeneXpert MRSA Assay (FDA approved for NASAL specimens only), is one component of a comprehensive MRSA colonization surveillance program. It is not intended to diagnose MRSA infection nor to guide or monitor treatment for MRSA infections. Performed at [facility], [address] [place], [place] [address]     Radiology Reports DG Chest 1 View  Result Date: 11/22/2019 CLINICAL DATA:  Central line placement EXAM: CHEST  1 VIEW COMPARISON:  06/01/2018 FINDINGS: Right neck vascular catheter is positioned with tip near the superior cavoatrial junction. Cardiomegaly. Mild, diffuse interstitial pulmonary opacity. IMPRESSION: 1. Right neck vascular catheter with tip near the superior cavoatrial junction. 2. Cardiomegaly with mild diffuse interstitial pulmonary opacity, which may  reflect edema or infection. Electronically Signed   By: Eddie Candle M.D.   On: 11/22/2019 14:59   IR Angiogram Selective Each  Additional Vessel  Result Date: 11/25/2019 CLINICAL DATA:  53 year old male with a history of life threatening of bleeding secondary to portal hypertension and gastric varices. The patient is status post balloon occlusion retrograde trans venous obliteration (BRTO) on 11/22/2019, with recurrent hemorrhage confirmed by upper endoscopy EXAM: ULTRASOUND-GUIDED TRANS HEPATIC PORTAL VEIN ACCESS ULTRASOUND GUIDED ACCESS RIGHT JUGULAR VEIN. TIPS PLACEMENT COIL AND PLUG EMBOLIZATION OF MULTIPLE PORTAL VEIN TRIBUTARIES TO A PERSISTING ESOPHAGOGASTRIC VARICEAL NETWORK MEDICATIONS: As antibiotic prophylaxis, 2 G ANCEF was ordered pre-procedure and administered intravenously within one hour of incision. One unit of platelets was also administered IV during the procedure. ANESTHESIA/SEDATION: General - as administered by the Anesthesia department Vascular Interventional Radiology physicians: Dr. Earleen Newport, Dr. Kathlene Cote, Dr. Laurence Ferrari CONTRAST:  67m OMNIPAQUE IOHEXOL 300 MG/ML SOLN, 748mOMNIPAQUE IOHEXOL 300 MG/ML SOLN, 7076mMNIPAQUE IOHEXOL 300 MG/ML SOLN FLUOROSCOPY TIME:  Fluoroscopy Time: 66 minutes 36 seconds (6280 mGy). COMPLICATIONS: None PROCEDURE: Informed written consent was obtained from the patient's family after a thorough discussion of the procedural risks, benefits and alternatives. Specific risks discussed with TIPS/variceal embolization included: Bleeding, infection, vascular injury, need for further procedure/surgery, renal injury/renal failure, contrast reaction, non-target embolization, liver dysfunction/failure, hepatic encephalopathy, stroke (~1%), cardiopulmonary collapse, death. All questions were addressed. Maximal Sterile Barrier Technique was utilized including caps, mask, sterile gowns, sterile gloves, sterile drape, hand hygiene and skin antiseptic. A timeout was  performed prior to the initiation of the procedure. Patient was positioned supine position on the table, with the right upper quadrant and the right neck prepped and draped in the usual sterile fashion. Ultrasound survey of the right upper quadrant was performed, with images stored and sent to PACs. Using ultrasound guidance, Chiba needle was used access the right portal system. Once we confirmed position in the portal system with portal venography, micro wire was advanced into the inferior mesenteric vein. The inner dilator was advanced over the metal stiffener into the portal system, with the inner dilator advanced over the wire. Venogram confirmed are location within the portal system. The micro wire was then replaced into the inner dilator, with the transition point position at the apex of the catheter. Check flow vial was placed on the back and of the inner dilator. Ultrasound survey was then performed of the right neck, with images stored and sent to PACs. Ultrasound guidance was then used to access the right internal jugular vein with a micro puncture kit. The wire was advanced under fluoroscopy into the right atrium, and a small incision was made. The needle was removed and the dilator was placed. The micro wire in the stiffener were removed and an 035 wire was then passed into the inferior vena cava. Ten French soft tissue dilation was performed on the wire, and then 10 [place] sheath was placed. Combination of Benson wire and multipurpose angled catheter were used to select the right hepatic vein. During the selection the right hepatic vein, accessory right hepatic vein was accessed with venography. Venogram confirmed location within the right hepatic vein. Tip sheath was then advanced over the catheter with a coaxial Amplatz wire, for a position cephalad to the transition point of the wire in the portal venous system. The Colapinto needle was then advanced through the TIPS sheath housed in the Teflon  sheath over the Amplatz wire. Using oblique projections, only a single pass was required to access the portal venous system. Of note, the liver was very dense, with difficulty penetrating the portal vein wall with the colapinto needle. Access  into the portal venous system was confirmed by aspirating portal venous blood and a venogram. A stiff Glidewire was then used to navigate through the portal vein, ultimately with purchase in the IMV. Needle was removed. The stock 5 French glide catheter was attempted to pass over the Glidewire into the portal system. The tip would not penetrate the portal vein wall. A 4 French straight glide cath was then advanced on the Glidewire, and with forward tension, ultimately the glide cath penetrated the portal vein wall, with good purchase into the portal vein. Venogram confirmed location. Amplatz wire was then passed into the splenic vein through the Glidewire. The next step of dilating the soft tissue tract with series of balloons was quite difficult given the density of the liver. Initially a 4 mm by 40 mm balloon was successful passing into the portal vein with dilation of the soft tissue tract. Even after 4 mm balloon angioplasty, the sheath would not advanced over the Amplatz wire and balloon into the portal system, encountering resistance at the wall of the portal vein. 5 mm balloon angioplasty was then performed, with a similar resistance to passing the sheath into the portal vein. We attempted to pass the sheath with the coaxial dilator/introducer which was unsuccessful. 6 mm balloon angioplasty along the length of the tissue tract was ultimately successful, only after several reposition of the Amplatz wire into the distal splenic vein with the use of the glide catheter. Once the tip sheath was in the portal system, we confirmed location with angiogram. We selected a 10 mm x 2 x 8 viator stent, which was deployed and post dilated to 10 mm. Venogram confirm excellent flow  through the TIPS shunt. Pigtail catheter was then advanced into the splenic vein with repeat portal venogram. This demonstrated significant perfusion of the gastroesophageal network via left gastric, posterior gastric, and accessory posterior gastric contribution. We proceeded with embolization. Angled multipurpose catheter was used to select the largest of the posterior gastric veins. Coil embolization then proceeded with fibered 035 interlock coils, using multiple 10 mm diameter, 8 mm diameter, and 6 mm diameter coils. Once the flow was static in the first posterior gastric vein contribution, the catheter was withdrawn and repeat venogram was performed demonstrating accessory posterior gastric vein. This vein was selected with the 035 multipurpose catheter and coil embolized with fibered interlock coils, as well as a Amplatzer 4 plug. Repeat splenic vein/portal vein venogram demonstrated occlusion of enlarged short gastric vein after the sclerotherapy treatment, with persisting left gastric vein contribution to the gastroesophageal network. The multipurpose catheter was then used to select these left gastric vein contributors, which were then coil embolized/plug embolized with a combination of fibrin coils and Amplatzer 4. Final angiogram was performed. The access in the right portal system was removed. Access in the right internal jugular vein was removed with hemostasis achieved. Sterile bandage was placed. Patient tolerated the procedure well and remained hemodynamically stable throughout. No complications were encountered and no significant blood loss. FINDINGS: Ultrasound guided access right internal jugular vein demonstrates wide patency. Creation of the portal systemic shunt demonstrated a significantly dense liver parenchyma, of which even the colapinto needle had difficulty crossing. Excellent flow through the TIPS shunt after creation, with 10 mm by 2 x 8 stent placed, dilated to 10 mm. Multiple portal  contribution identified from the portal and splenic vein including posterior gastric, accessory posterior gastric, left gastric, and accessory left gastric venous inflow into a persisting gastroesophageal varices network. Each of  these was coil embolized/plug embolized to stasis with no persisting flow into the network upon completion. The preferential portal venous contribution that was treated during the balloon occlusion retrograde trans venous ablation on Sunday was a large short gastric vein which remains occluded on today's study. Persisting changes of prior sclerotherapy in the gastric variceal system. IMPRESSION: Status post TIPS creation and treatment of persisting portal contribution to gastroesophageal variceal network by combination of coil and plug embolization to stasis. Signed, Dulcy Fanny. Dellia Nims, RPVI [facility] Radiology Electronically Signed   By: Corrie Mckusick D.O.   On: 11/25/2019 13:21   IR Angiogram Selective Each Additional Vessel  Result Date: 11/25/2019 CLINICAL DATA:  53 year old male with a history of life threatening of bleeding secondary to portal hypertension and gastric varices. The patient is status post balloon occlusion retrograde trans venous obliteration (BRTO) on 11/22/2019, with recurrent hemorrhage confirmed by upper endoscopy EXAM: ULTRASOUND-GUIDED TRANS HEPATIC PORTAL VEIN ACCESS ULTRASOUND GUIDED ACCESS RIGHT JUGULAR VEIN. TIPS PLACEMENT COIL AND PLUG EMBOLIZATION OF MULTIPLE PORTAL VEIN TRIBUTARIES TO A PERSISTING ESOPHAGOGASTRIC VARICEAL NETWORK MEDICATIONS: As antibiotic prophylaxis, 2 G ANCEF was ordered pre-procedure and administered intravenously within one hour of incision. One unit of platelets was also administered IV during the procedure. ANESTHESIA/SEDATION: General - as administered by the Anesthesia department Vascular Interventional Radiology physicians: Dr. Earleen Newport, Dr. Kathlene Cote, Dr. Laurence Ferrari CONTRAST:   51m OMNIPAQUE IOHEXOL 300 MG/ML SOLN, 734mOMNIPAQUE IOHEXOL 300 MG/ML SOLN, 7080mMNIPAQUE IOHEXOL 300 MG/ML SOLN FLUOROSCOPY TIME:  Fluoroscopy Time: 66 minutes 36 seconds (6280 mGy). COMPLICATIONS: None PROCEDURE: Informed written consent was obtained from the patient's family after a thorough discussion of the procedural risks, benefits and alternatives. Specific risks discussed with TIPS/variceal embolization included: Bleeding, infection, vascular injury, need for further procedure/surgery, renal injury/renal failure, contrast reaction, non-target embolization, liver dysfunction/failure, hepatic encephalopathy, stroke (~1%), cardiopulmonary collapse, death. All questions were addressed. Maximal Sterile Barrier Technique was utilized including caps, mask, sterile gowns, sterile gloves, sterile drape, hand hygiene and skin antiseptic. A timeout was performed prior to the initiation of the procedure. Patient was positioned supine position on the table, with the right upper quadrant and the right neck prepped and draped in the usual sterile fashion. Ultrasound survey of the right upper quadrant was performed, with images stored and sent to PACs. Using ultrasound guidance, Chiba needle was used access the right portal system. Once we confirmed position in the portal system with portal venography, micro wire was advanced into the inferior mesenteric vein. The inner dilator was advanced over the metal stiffener into the portal system, with the inner dilator advanced over the wire. Venogram confirmed are location within the portal system. The micro wire was then replaced into the inner dilator, with the transition point position at the apex of the catheter. Check flow vial was placed on the back and of the inner dilator. Ultrasound survey was then performed of the right neck, with images stored and sent to PACs. Ultrasound guidance was then used to access the right internal jugular vein with a micro puncture kit. The  wire was advanced under fluoroscopy into the right atrium, and a small incision was made. The needle was removed and the dilator was placed. The micro wire in the stiffener were removed and an 035 wire was then passed into the inferior vena cava. Ten French soft tissue dilation was performed on the wire, and then 10 [place] sheath was placed. Combination of Benson wire and multipurpose angled catheter  were used to select the right hepatic vein. During the selection the right hepatic vein, accessory right hepatic vein was accessed with venography. Venogram confirmed location within the right hepatic vein. Tip sheath was then advanced over the catheter with a coaxial Amplatz wire, for a position cephalad to the transition point of the wire in the portal venous system. The Colapinto needle was then advanced through the TIPS sheath housed in the Teflon sheath over the Amplatz wire. Using oblique projections, only a single pass was required to access the portal venous system. Of note, the liver was very dense, with difficulty penetrating the portal vein wall with the colapinto needle. Access into the portal venous system was confirmed by aspirating portal venous blood and a venogram. A stiff Glidewire was then used to navigate through the portal vein, ultimately with purchase in the IMV. Needle was removed. The stock 5 French glide catheter was attempted to pass over the Glidewire into the portal system. The tip would not penetrate the portal vein wall. A 4 French straight glide cath was then advanced on the Glidewire, and with forward tension, ultimately the glide cath penetrated the portal vein wall, with good purchase into the portal vein. Venogram confirmed location. Amplatz wire was then passed into the splenic vein through the Glidewire. The next step of dilating the soft tissue tract with series of balloons was quite difficult given the density of the liver. Initially a 4 mm by 40 mm balloon was successful  passing into the portal vein with dilation of the soft tissue tract. Even after 4 mm balloon angioplasty, the sheath would not advanced over the Amplatz wire and balloon into the portal system, encountering resistance at the wall of the portal vein. 5 mm balloon angioplasty was then performed, with a similar resistance to passing the sheath into the portal vein. We attempted to pass the sheath with the coaxial dilator/introducer which was unsuccessful. 6 mm balloon angioplasty along the length of the tissue tract was ultimately successful, only after several reposition of the Amplatz wire into the distal splenic vein with the use of the glide catheter. Once the tip sheath was in the portal system, we confirmed location with angiogram. We selected a 10 mm x 2 x 8 viator stent, which was deployed and post dilated to 10 mm. Venogram confirm excellent flow through the TIPS shunt. Pigtail catheter was then advanced into the splenic vein with repeat portal venogram. This demonstrated significant perfusion of the gastroesophageal network via left gastric, posterior gastric, and accessory posterior gastric contribution. We proceeded with embolization. Angled multipurpose catheter was used to select the largest of the posterior gastric veins. Coil embolization then proceeded with fibered 035 interlock coils, using multiple 10 mm diameter, 8 mm diameter, and 6 mm diameter coils. Once the flow was static in the first posterior gastric vein contribution, the catheter was withdrawn and repeat venogram was performed demonstrating accessory posterior gastric vein. This vein was selected with the 035 multipurpose catheter and coil embolized with fibered interlock coils, as well as a Amplatzer 4 plug. Repeat splenic vein/portal vein venogram demonstrated occlusion of enlarged short gastric vein after the sclerotherapy treatment, with persisting left gastric vein contribution to the gastroesophageal network. The multipurpose catheter  was then used to select these left gastric vein contributors, which were then coil embolized/plug embolized with a combination of fibrin coils and Amplatzer 4. Final angiogram was performed. The access in the right portal system was removed. Access in the right internal jugular vein  was removed with hemostasis achieved. Sterile bandage was placed. Patient tolerated the procedure well and remained hemodynamically stable throughout. No complications were encountered and no significant blood loss. FINDINGS: Ultrasound guided access right internal jugular vein demonstrates wide patency. Creation of the portal systemic shunt demonstrated a significantly dense liver parenchyma, of which even the colapinto needle had difficulty crossing. Excellent flow through the TIPS shunt after creation, with 10 mm by 2 x 8 stent placed, dilated to 10 mm. Multiple portal contribution identified from the portal and splenic vein including posterior gastric, accessory posterior gastric, left gastric, and accessory left gastric venous inflow into a persisting gastroesophageal varices network. Each of these was coil embolized/plug embolized to stasis with no persisting flow into the network upon completion. The preferential portal venous contribution that was treated during the balloon occlusion retrograde trans venous ablation on Sunday was a large short gastric vein which remains occluded on today's study. Persisting changes of prior sclerotherapy in the gastric variceal system. IMPRESSION: Status post TIPS creation and treatment of persisting portal contribution to gastroesophageal variceal network by combination of coil and plug embolization to stasis. Signed, Dulcy Fanny. Dellia Nims, RPVI Vascular and Interventional Radiology Specialists [facility] Radiology Electronically Signed   By: Corrie Mckusick D.O.   On: 11/25/2019 13:21   IR Angiogram Selective Each Additional Vessel  Result Date: 11/25/2019 CLINICAL DATA:  53 year old male with  a history of life threatening of bleeding secondary to portal hypertension and gastric varices. The patient is status post balloon occlusion retrograde trans venous obliteration (BRTO) on 11/22/2019, with recurrent hemorrhage confirmed by upper endoscopy EXAM: ULTRASOUND-GUIDED TRANS HEPATIC PORTAL VEIN ACCESS ULTRASOUND GUIDED ACCESS RIGHT JUGULAR VEIN. TIPS PLACEMENT COIL AND PLUG EMBOLIZATION OF MULTIPLE PORTAL VEIN TRIBUTARIES TO A PERSISTING ESOPHAGOGASTRIC VARICEAL NETWORK MEDICATIONS: As antibiotic prophylaxis, 2 G ANCEF was ordered pre-procedure and administered intravenously within one hour of incision. One unit of platelets was also administered IV during the procedure. ANESTHESIA/SEDATION: General - as administered by the Anesthesia department Vascular Interventional Radiology physicians: Dr. Earleen Newport, Dr. Kathlene Cote, Dr. Laurence Ferrari CONTRAST:  21m OMNIPAQUE IOHEXOL 300 MG/ML SOLN, 719mOMNIPAQUE IOHEXOL 300 MG/ML SOLN, 7069mMNIPAQUE IOHEXOL 300 MG/ML SOLN FLUOROSCOPY TIME:  Fluoroscopy Time: 66 minutes 36 seconds (6280 mGy). COMPLICATIONS: None PROCEDURE: Informed written consent was obtained from the patient's family after a thorough discussion of the procedural risks, benefits and alternatives. Specific risks discussed with TIPS/variceal embolization included: Bleeding, infection, vascular injury, need for further procedure/surgery, renal injury/renal failure, contrast reaction, non-target embolization, liver dysfunction/failure, hepatic encephalopathy, stroke (~1%), cardiopulmonary collapse, death. All questions were addressed. Maximal Sterile Barrier Technique was utilized including caps, mask, sterile gowns, sterile gloves, sterile drape, hand hygiene and skin antiseptic. A timeout was performed prior to the initiation of the procedure. Patient was positioned supine position on the table, with the right upper quadrant and the right neck prepped and draped in the usual sterile fashion. Ultrasound survey  of the right upper quadrant was performed, with images stored and sent to PACs. Using ultrasound guidance, Chiba needle was used access the right portal system. Once we confirmed position in the portal system with portal venography, micro wire was advanced into the inferior mesenteric vein. The inner dilator was advanced over the metal stiffener into the portal system, with the inner dilator advanced over the wire. Venogram confirmed are location within the portal system. The micro wire was then replaced into the inner dilator, with the transition point position at the apex of the catheter. Check flow vial was  placed on the back and of the inner dilator. Ultrasound survey was then performed of the right neck, with images stored and sent to PACs. Ultrasound guidance was then used to access the right internal jugular vein with a micro puncture kit. The wire was advanced under fluoroscopy into the right atrium, and a small incision was made. The needle was removed and the dilator was placed. The micro wire in the stiffener were removed and an 035 wire was then passed into the inferior vena cava. Ten French soft tissue dilation was performed on the wire, and then 10 [place] TIPS sheath was placed. Combination of Benson wire and multipurpose angled catheter were used to select the right hepatic vein. During the selection the right hepatic vein, accessory right hepatic vein was accessed with venography. Venogram confirmed location within the right hepatic vein. Tip sheath was then advanced over the catheter with a coaxial Amplatz wire, for a position cephalad to the transition point of the wire in the portal venous system. The Colapinto needle was then advanced through the TIPS sheath housed in the Teflon sheath over the Amplatz wire. Using oblique projections, only a single pass was required to access the portal venous system. Of note, the liver was very dense, with difficulty penetrating the portal vein wall with the  colapinto needle. Access into the portal venous system was confirmed by aspirating portal venous blood and a venogram. A stiff Glidewire was then used to navigate through the portal vein, ultimately with purchase in the IMV. Needle was removed. The stock 5 French glide catheter was attempted to pass over the Glidewire into the portal system. The tip would not penetrate the portal vein wall. A 4 French straight glide cath was then advanced on the Glidewire, and with forward tension, ultimately the glide cath penetrated the portal vein wall, with good purchase into the portal vein. Venogram confirmed location. Amplatz wire was then passed into the splenic vein through the Glidewire. The next step of dilating the soft tissue tract with series of balloons was quite difficult given the density of the liver. Initially a 4 mm by 40 mm balloon was successful passing into the portal vein with dilation of the soft tissue tract. Even after 4 mm balloon angioplasty, the sheath would not advanced over the Amplatz wire and balloon into the portal system, encountering resistance at the wall of the portal vein. 5 mm balloon angioplasty was then performed, with a similar resistance to passing the sheath into the portal vein. We attempted to pass the sheath with the coaxial dilator/introducer which was unsuccessful. 6 mm balloon angioplasty along the length of the tissue tract was ultimately successful, only after several reposition of the Amplatz wire into the distal splenic vein with the use of the glide catheter. Once the tip sheath was in the portal system, we confirmed location with angiogram. We selected a 10 mm x 2 x 8 viator stent, which was deployed and post dilated to 10 mm. Venogram confirm excellent flow through the TIPS shunt. Pigtail catheter was then advanced into the splenic vein with repeat portal venogram. This demonstrated significant perfusion of the gastroesophageal network via left gastric, posterior gastric,  and accessory posterior gastric contribution. We proceeded with embolization. Angled multipurpose catheter was used to select the largest of the posterior gastric veins. Coil embolization then proceeded with fibered 035 interlock coils, using multiple 10 mm diameter, 8 mm diameter, and 6 mm diameter coils. Once the flow was static in the first posterior gastric  vein contribution, the catheter was withdrawn and repeat venogram was performed demonstrating accessory posterior gastric vein. This vein was selected with the 035 multipurpose catheter and coil embolized with fibered interlock coils, as well as a Amplatzer 4 plug. Repeat splenic vein/portal vein venogram demonstrated occlusion of enlarged short gastric vein after the sclerotherapy treatment, with persisting left gastric vein contribution to the gastroesophageal network. The multipurpose catheter was then used to select these left gastric vein contributors, which were then coil embolized/plug embolized with a combination of fibrin coils and Amplatzer 4. Final angiogram was performed. The access in the right portal system was removed. Access in the right internal jugular vein was removed with hemostasis achieved. Sterile bandage was placed. Patient tolerated the procedure well and remained hemodynamically stable throughout. No complications were encountered and no significant blood loss. FINDINGS: Ultrasound guided access right internal jugular vein demonstrates wide patency. Creation of the portal systemic shunt demonstrated a significantly dense liver parenchyma, of which even the colapinto needle had difficulty crossing. Excellent flow through the TIPS shunt after creation, with 10 mm by 2 x 8 stent placed, dilated to 10 mm. Multiple portal contribution identified from the portal and splenic vein including posterior gastric, accessory posterior gastric, left gastric, and accessory left gastric venous inflow into a persisting gastroesophageal varices  network. Each of these was coil embolized/plug embolized to stasis with no persisting flow into the network upon completion. The preferential portal venous contribution that was treated during the balloon occlusion retrograde trans venous ablation on Sunday was a large short gastric vein which remains occluded on today's study. Persisting changes of prior sclerotherapy in the gastric variceal system. IMPRESSION: Status post TIPS creation and treatment of persisting portal contribution to gastroesophageal variceal network by combination of coil and plug embolization to stasis. Signed, Dulcy Fanny. Dellia Nims, RPVI Vascular and Interventional Radiology Specialists [facility] Radiology Electronically Signed   By: Corrie Mckusick D.O.   On: 11/25/2019 13:21   IR Angiogram Selective Each Additional Vessel  Result Date: 11/22/2019 CLINICAL DATA:  53 year old male with alcoholic cirrhosis and upper at GI hemorrhage, transferred from [facility] with gastric variceal hemorrhage, status post endoscopic identification and clipping. He has continued to have GI blood loss with need for pressor support, and presents now for urgent treatment with BRTO and/or TIPS EXAM: ULTRASOUND GUIDED ACCESS RIGHT COMMON FEMORAL VEIN LEFT RENAL VEIN ANGIOGRAM RETROGRADE SCLEROTHERAPY OF GASTRIC VARICES, ASSISTED WITH COIL EMBOLIZATION MEDICATIONS: As antibiotic prophylaxis, 2 G ANCEF was ordered pre-procedure and administered intravenously within one hour of incision. One unit of platelets was also administered IV during the procedure. ANESTHESIA/SEDATION: General - as administered by the Anesthesia department CONTRAST:  90 cc IV contrast FLUOROSCOPY TIME:  Fluoroscopy Time: 19 minutes 42 seconds (12/29/2041 mGy). COMPLICATIONS: None PROCEDURE: Informed written consent was obtained from the patient and the patient's family after a thorough discussion of the procedural risks, benefits and alternatives. Specific risks that were addressed  regarding BRTO include, bleeding, infection, contrast reaction, kidney injury, need for further procedure or surgery including TIPS and/ or endoscopy, pulmonary embolism, stroke, worsening of ascites, worsening of esophageal varices, paradoxical liver failure, varix rupture, cardiopulmonary collapse, death. All questions were addressed. Maximal Sterile Barrier Technique was utilized including caps, mask, sterile gowns, sterile gloves, sterile drape, hand hygiene and skin antiseptic. A timeout was performed prior to the initiation of the procedure. Ultrasound survey of the right inguinal region was performed with images stored and sent to PACs. A single wall puncture needle was  used access the right common femoral artery under ultrasound guidance with saline syringe. With venous blood flow returned, a Bentson wire was observed to enter the common femoral vein and the IVC under fluoroscopy. The needle was removed, and a small incision was made, and 9 [place] dilator was passed over the Bentson wire. A 10 French TIPS sheath was then passed over the Bentson wire after manually creating a significant leftward current on the catheter. The inner dilator and wire were removed, and a 5 [place] cobra catheter was passed over the Bentson wire into the IVC. Cobra catheter was used to select the left renal vein orifice. Combination of the Bentson wire, Glidewire, Rosen wire were used to gain purchased into the renal vein, with renal vein limited venogram performed to identify the in flowing gastro renal shunt. With a safety curve on a stiff Amplatz wire position into superior pole renal vein, the TIPS sheath was advanced into the left renal vein over the Cobra catheter. Pullback reinforced straight sheath selection technique (PRESSS) was performed, and as the tip sheath was withdrawn, contrast was used to identified the inflow of the gastro renal shunt. With the coaxial Amplatz wire in position, a combination of a standard Kumpe  catheter and a 4 French angled glide catheter were used to cannulate the orifice of the gastro renal shunt flowing shunt opposite the adrenal vein inflow. Once the Glidewire was advanced into the shunt, the catheter was advanced, and the Glidewire was exchanged for a stiff Amplatz wire with a safety curve on the tip. The renal vein buddy wire was withdrawn, and the TIPS sheath was advanced into the gastro renal shunt. As the 10 [place] TIPS sheath was clearly not long enough at 35 cm, a 45 cm 12 French sheath was placed. This sheath was then advanced into the most inferior aspects of the shunt orifice into the renal vein, though would not advance further given the length. Once the glide catheter and the Glidewire were in a reasonable location within the shunt, the catheter was withdrawn and then a balloon occlusion catheter was advanced over the Amplatz wire into the gastro renal shunt. Amplatz wire was then withdrawn. Angiogram was then performed to confirmed location within the gastro renal shunt outflow. Initial balloon inflation did not achieve stasis of the shunt, and the balloon was deflated with repositioning needed. There was difficulty passing the Toluca balloon catheter into a more distal position of the shunt, likely secondary to tortuosity and/or venous web. Exchange for an 8 [place] Merci balloon, 95 cm, was performed on an Amplatz wire. A more distal position within the shunt was achieved. Balloon on the [facility] was inflated on the balloon occlusion catheter and was withdrawn to the first encountered web to achieve hemostasis. Angiogram confirmed stasis of flow. A CO2 angiogram was also performed, identifying the posterior gastric veins as the stopping point. A penumbra Lantern microcatheter and a fathom wire were navigated into the shunt. Once we confirmed location within the outflow with standard angiogram, treatment was initiated. A foamed solution of detergent/sclerosant was mixed in a  standard Tessari method, with 1:2:3 ratio of ethiodized oil: sodium tetradecyl sulfate: Room air. The sclerosed sent was infused under fluoroscopic guidance, with observation of reflux into the afferent veins. Once reflux was identified into the posterior gastric vein, sclerosing was completed. This required only approximately 15 cc of foam sclerotherapy in the above ratio. Once the posterior gastric veins were identified, we stop treatment. The micro catheter was withdrawn  to the tip of the balloon occlusion catheter, and coil mass was deposited with deployment of multiple Ruby coils. This was performed with 24 by 60 cm coil, 28 x 60 cm coil, and 3 by 32 x 60 cm coil. The balloon was deflated under fluoroscopy, with no motion identified. The Merci balloon was slightly withdrawn into the more distal shunt (closer to the renal vein) and angiogram was performed confirming complete stasis of flow beyond the coil mass. Once we were confident there was no migration, balloon was withdrawn and the sheath was withdrawn. Hemostasis was achieved with manual compression. Patient remained hemodynamically stable throughout. No complications were encountered and no significant blood loss was encountered. IMPRESSION: Status post ultrasound guided access right common femoral vein for coil-assisted BRTO (sclerotherapy) as treatment for life-threatening gastric variceal hemorrhage. Signed, Dulcy Fanny. Dellia Nims, [place] Vascular and Interventional Radiology Specialists [facility] Radiology Plan PLAN: Observe in PACU, then returned ICU Continue current management including serial H and H Local wound care management at the common femoral vein access site Repeat CT angiogram abdomen/pelvis after 24 hours-48 hours on this admission Follow-up vascular Interventional Radiology Clinic in 4-8 weeks Electronically Signed   By: Corrie Mckusick D.O.   On: 11/22/2019 14:32   IR Venogram Renal Uni Left  Result Date: 11/22/2019 CLINICAL DATA:   53 year old male with alcoholic cirrhosis and upper at GI hemorrhage, transferred from [facility] with gastric variceal hemorrhage, status post endoscopic identification and clipping. He has continued to have GI blood loss with need for pressor support, and presents now for urgent treatment with BRTO and/or TIPS EXAM: ULTRASOUND GUIDED ACCESS RIGHT COMMON FEMORAL VEIN LEFT RENAL VEIN ANGIOGRAM RETROGRADE SCLEROTHERAPY OF GASTRIC VARICES, ASSISTED WITH COIL EMBOLIZATION MEDICATIONS: As antibiotic prophylaxis, 2 G ANCEF was ordered pre-procedure and administered intravenously within one hour of incision. One unit of platelets was also administered IV during the procedure. ANESTHESIA/SEDATION: General - as administered by the Anesthesia department CONTRAST:  90 cc IV contrast FLUOROSCOPY TIME:  Fluoroscopy Time: 19 minutes 42 seconds (12/29/2041 mGy). COMPLICATIONS: None PROCEDURE: Informed written consent was obtained from the patient and the patient's family after a thorough discussion of the procedural risks, benefits and alternatives. Specific risks that were addressed regarding BRTO include, bleeding, infection, contrast reaction, kidney injury, need for further procedure or surgery including TIPS and/ or endoscopy, pulmonary embolism, stroke, worsening of ascites, worsening of esophageal varices, paradoxical liver failure, varix rupture, cardiopulmonary collapse, death. All questions were addressed. Maximal Sterile Barrier Technique was utilized including caps, mask, sterile gowns, sterile gloves, sterile drape, hand hygiene and skin antiseptic. A timeout was performed prior to the initiation of the procedure. Ultrasound survey of the right inguinal region was performed with images stored and sent to PACs. A single wall puncture needle was used access the right common femoral artery under ultrasound guidance with saline syringe. With venous blood flow returned, a Bentson wire was observed to enter the  common femoral vein and the IVC under fluoroscopy. The needle was removed, and a small incision was made, and 9 [place] dilator was passed over the Bentson wire. A 10 French TIPS sheath was then passed over the Bentson wire after manually creating a significant leftward current on the catheter. The inner dilator and wire were removed, and a 5 [place] cobra catheter was passed over the Bentson wire into the IVC. Cobra catheter was used to select the left renal vein orifice. Combination of the Bentson wire, Glidewire, Rosen wire were used to gain purchased  into the renal vein, with renal vein limited venogram performed to identify the in flowing gastro renal shunt. With a safety curve on a stiff Amplatz wire position into superior pole renal vein, the TIPS sheath was advanced into the left renal vein over the Cobra catheter. Pullback reinforced straight sheath selection technique (PRESSS) was performed, and as the tip sheath was withdrawn, contrast was used to identified the inflow of the gastro renal shunt. With the coaxial Amplatz wire in position, a combination of a standard Kumpe catheter and a 4 French angled glide catheter were used to cannulate the orifice of the gastro renal shunt flowing shunt opposite the adrenal vein inflow. Once the Glidewire was advanced into the shunt, the catheter was advanced, and the Glidewire was exchanged for a stiff Amplatz wire with a safety curve on the tip. The renal vein buddy wire was withdrawn, and the TIPS sheath was advanced into the gastro renal shunt. As the 10 [place] TIPS sheath was clearly not long enough at 35 cm, a 45 cm 12 French sheath was placed. This sheath was then advanced into the most inferior aspects of the shunt orifice into the renal vein, though would not advance further given the length. Once the glide catheter and the Glidewire were in a reasonable location within the shunt, the catheter was withdrawn and then a balloon occlusion catheter was advanced over  the Amplatz wire into the gastro renal shunt. Amplatz wire was then withdrawn. Angiogram was then performed to confirmed location within the gastro renal shunt outflow. Initial balloon inflation did not achieve stasis of the shunt, and the balloon was deflated with repositioning needed. There was difficulty passing the [place] balloon catheter into a more distal position of the shunt, likely secondary to tortuosity and/or venous web. Exchange for an 8 [place] Merci balloon, 95 cm, was performed on an Amplatz wire. A more distal position within the shunt was achieved. Balloon on the [facility] was inflated on the balloon occlusion catheter and was withdrawn to the first encountered web to achieve hemostasis. Angiogram confirmed stasis of flow. A CO2 angiogram was also performed, identifying the posterior gastric veins as the stopping point. A penumbra Lantern microcatheter and a fathom wire were navigated into the shunt. Once we confirmed location within the outflow with standard angiogram, treatment was initiated. A foamed solution of detergent/sclerosant was mixed in a standard Tessari method, with 1:2:3 ratio of ethiodized oil: sodium tetradecyl sulfate: Room air. The sclerosed sent was infused under fluoroscopic guidance, with observation of reflux into the afferent veins. Once reflux was identified into the posterior gastric vein, sclerosing was completed. This required only approximately 15 cc of foam sclerotherapy in the above ratio. Once the posterior gastric veins were identified, we stop treatment. The micro catheter was withdrawn to the tip of the balloon occlusion catheter, and coil mass was deposited with deployment of multiple Ruby coils. This was performed with 24 by 60 cm coil, 28 x 60 cm coil, and 3 by 32 x 60 cm coil. The balloon was deflated under fluoroscopy, with no motion identified. The Merci balloon was slightly withdrawn into the more distal shunt (closer to the renal vein) and angiogram  was performed confirming complete stasis of flow beyond the coil mass. Once we were confident there was no migration, balloon was withdrawn and the sheath was withdrawn. Hemostasis was achieved with manual compression. Patient remained hemodynamically stable throughout. No complications were encountered and no significant blood loss was encountered. IMPRESSION: Status post ultrasound  guided access right common femoral vein for coil-assisted BRTO (sclerotherapy) as treatment for life-threatening gastric variceal hemorrhage. Signed, Dulcy Fanny. Dellia Nims, [place] [facility] Radiology Plan PLAN: Observe in PACU, then returned ICU Continue current management including serial H and H Local wound care management at the common femoral vein access site Repeat CT angiogram abdomen/pelvis after 24 hours-48 hours on this admission Follow-up vascular Interventional Radiology Clinic in 4-8 weeks Electronically Signed   By: Corrie Mckusick D.O.   On: 11/22/2019 14:32   IR Tips  Result Date: 11/25/2019 CLINICAL DATA:  53 year old male with a history of life threatening of bleeding secondary to portal hypertension and gastric varices. The patient is status post balloon occlusion retrograde trans venous obliteration (BRTO) on 11/22/2019, with recurrent hemorrhage confirmed by upper endoscopy EXAM: ULTRASOUND-GUIDED TRANS HEPATIC PORTAL VEIN ACCESS ULTRASOUND GUIDED ACCESS RIGHT JUGULAR VEIN. TIPS PLACEMENT COIL AND PLUG EMBOLIZATION OF MULTIPLE PORTAL VEIN TRIBUTARIES TO A PERSISTING ESOPHAGOGASTRIC VARICEAL NETWORK MEDICATIONS: As antibiotic prophylaxis, 2 G ANCEF was ordered pre-procedure and administered intravenously within one hour of incision. One unit of platelets was also administered IV during the procedure. ANESTHESIA/SEDATION: General - as administered by the Anesthesia department Vascular Interventional Radiology physicians: Dr. Earleen Newport, Dr. Kathlene Cote, Dr. Laurence Ferrari  CONTRAST:  6m OMNIPAQUE IOHEXOL 300 MG/ML SOLN, 74mOMNIPAQUE IOHEXOL 300 MG/ML SOLN, 7013mMNIPAQUE IOHEXOL 300 MG/ML SOLN FLUOROSCOPY TIME:  Fluoroscopy Time: 66 minutes 36 seconds (6280 mGy). COMPLICATIONS: None PROCEDURE: Informed written consent was obtained from the patient's family after a thorough discussion of the procedural risks, benefits and alternatives. Specific risks discussed with TIPS/variceal embolization included: Bleeding, infection, vascular injury, need for further procedure/surgery, renal injury/renal failure, contrast reaction, non-target embolization, liver dysfunction/failure, hepatic encephalopathy, stroke (~1%), cardiopulmonary collapse, death. All questions were addressed. Maximal Sterile Barrier Technique was utilized including caps, mask, sterile gowns, sterile gloves, sterile drape, hand hygiene and skin antiseptic. A timeout was performed prior to the initiation of the procedure. Patient was positioned supine position on the table, with the right upper quadrant and the right neck prepped and draped in the usual sterile fashion. Ultrasound survey of the right upper quadrant was performed, with images stored and sent to PACs. Using ultrasound guidance, Chiba needle was used access the right portal system. Once we confirmed position in the portal system with portal venography, micro wire was advanced into the inferior mesenteric vein. The inner dilator was advanced over the metal stiffener into the portal system, with the inner dilator advanced over the wire. Venogram confirmed are location within the portal system. The micro wire was then replaced into the inner dilator, with the transition point position at the apex of the catheter. Check flow vial was placed on the back and of the inner dilator. Ultrasound survey was then performed of the right neck, with images stored and sent to PACs. Ultrasound guidance was then used to access the right internal jugular vein with a micro  puncture kit. The wire was advanced under fluoroscopy into the right atrium, and a small incision was made. The needle was removed and the dilator was placed. The micro wire in the stiffener were removed and an 035 wire was then passed into the inferior vena cava. Ten French soft tissue dilation was performed on the wire, and then 10 [place] sheath was placed. Combination of Benson wire and multipurpose angled catheter were used to select the right hepatic vein. During the selection the right hepatic vein, accessory right hepatic vein was accessed  with venography. Venogram confirmed location within the right hepatic vein. Tip sheath was then advanced over the catheter with a coaxial Amplatz wire, for a position cephalad to the transition point of the wire in the portal venous system. The Colapinto needle was then advanced through the TIPS sheath housed in the Teflon sheath over the Amplatz wire. Using oblique projections, only a single pass was required to access the portal venous system. Of note, the liver was very dense, with difficulty penetrating the portal vein wall with the colapinto needle. Access into the portal venous system was confirmed by aspirating portal venous blood and a venogram. A stiff Glidewire was then used to navigate through the portal vein, ultimately with purchase in the IMV. Needle was removed. The stock 5 French glide catheter was attempted to pass over the Glidewire into the portal system. The tip would not penetrate the portal vein wall. A 4 French straight glide cath was then advanced on the Glidewire, and with forward tension, ultimately the glide cath penetrated the portal vein wall, with good purchase into the portal vein. Venogram confirmed location. Amplatz wire was then passed into the splenic vein through the Glidewire. The next step of dilating the soft tissue tract with series of balloons was quite difficult given the density of the liver. Initially a 4 mm by 40 mm balloon  was successful passing into the portal vein with dilation of the soft tissue tract. Even after 4 mm balloon angioplasty, the sheath would not advanced over the Amplatz wire and balloon into the portal system, encountering resistance at the wall of the portal vein. 5 mm balloon angioplasty was then performed, with a similar resistance to passing the sheath into the portal vein. We attempted to pass the sheath with the coaxial dilator/introducer which was unsuccessful. 6 mm balloon angioplasty along the length of the tissue tract was ultimately successful, only after several reposition of the Amplatz wire into the distal splenic vein with the use of the glide catheter. Once the tip sheath was in the portal system, we confirmed location with angiogram. We selected a 10 mm x 2 x 8 viator stent, which was deployed and post dilated to 10 mm. Venogram confirm excellent flow through the TIPS shunt. Pigtail catheter was then advanced into the splenic vein with repeat portal venogram. This demonstrated significant perfusion of the gastroesophageal network via left gastric, posterior gastric, and accessory posterior gastric contribution. We proceeded with embolization. Angled multipurpose catheter was used to select the largest of the posterior gastric veins. Coil embolization then proceeded with fibered 035 interlock coils, using multiple 10 mm diameter, 8 mm diameter, and 6 mm diameter coils. Once the flow was static in the first posterior gastric vein contribution, the catheter was withdrawn and repeat venogram was performed demonstrating accessory posterior gastric vein. This vein was selected with the 035 multipurpose catheter and coil embolized with fibered interlock coils, as well as a Amplatzer 4 plug. Repeat splenic vein/portal vein venogram demonstrated occlusion of enlarged short gastric vein after the sclerotherapy treatment, with persisting left gastric vein contribution to the gastroesophageal network. The  multipurpose catheter was then used to select these left gastric vein contributors, which were then coil embolized/plug embolized with a combination of fibrin coils and Amplatzer 4. Final angiogram was performed. The access in the right portal system was removed. Access in the right internal jugular vein was removed with hemostasis achieved. Sterile bandage was placed. Patient tolerated the procedure well and remained hemodynamically stable throughout. No complications  were encountered and no significant blood loss. FINDINGS: Ultrasound guided access right internal jugular vein demonstrates wide patency. Creation of the portal systemic shunt demonstrated a significantly dense liver parenchyma, of which even the colapinto needle had difficulty crossing. Excellent flow through the TIPS shunt after creation, with 10 mm by 2 x 8 stent placed, dilated to 10 mm. Multiple portal contribution identified from the portal and splenic vein including posterior gastric, accessory posterior gastric, left gastric, and accessory left gastric venous inflow into a persisting gastroesophageal varices network. Each of these was coil embolized/plug embolized to stasis with no persisting flow into the network upon completion. The preferential portal venous contribution that was treated during the balloon occlusion retrograde trans venous ablation on Sunday was a large short gastric vein which remains occluded on today's study. Persisting changes of prior sclerotherapy in the gastric variceal system. IMPRESSION: Status post TIPS creation and treatment of persisting portal contribution to gastroesophageal variceal network by combination of coil and plug embolization to stasis. Signed, Dulcy Fanny. Dellia Nims, RPVI Vascular and Interventional Radiology Specialists [facility] Radiology Electronically Signed   By: Corrie Mckusick D.O.   On: 11/25/2019 13:21   IR US Guide Vasc Access Right  Result Date: 11/22/2019 CLINICAL DATA:  53 year old  male with alcoholic cirrhosis and upper at GI hemorrhage, transferred from [facility] with gastric variceal hemorrhage, status post endoscopic identification and clipping. He has continued to have GI blood loss with need for pressor support, and presents now for urgent treatment with BRTO and/or TIPS EXAM: ULTRASOUND GUIDED ACCESS RIGHT COMMON FEMORAL VEIN LEFT RENAL VEIN ANGIOGRAM RETROGRADE SCLEROTHERAPY OF GASTRIC VARICES, ASSISTED WITH COIL EMBOLIZATION MEDICATIONS: As antibiotic prophylaxis, 2 G ANCEF was ordered pre-procedure and administered intravenously within one hour of incision. One unit of platelets was also administered IV during the procedure. ANESTHESIA/SEDATION: General - as administered by the Anesthesia department CONTRAST:  90 cc IV contrast FLUOROSCOPY TIME:  Fluoroscopy Time: 19 minutes 42 seconds (12/29/2041 mGy). COMPLICATIONS: None PROCEDURE: Informed written consent was obtained from the patient and the patient's family after a thorough discussion of the procedural risks, benefits and alternatives. Specific risks that were addressed regarding BRTO include, bleeding, infection, contrast reaction, kidney injury, need for further procedure or surgery including TIPS and/ or endoscopy, pulmonary embolism, stroke, worsening of ascites, worsening of esophageal varices, paradoxical liver failure, varix rupture, cardiopulmonary collapse, death. All questions were addressed. Maximal Sterile Barrier Technique was utilized including caps, mask, sterile gowns, sterile gloves, sterile drape, hand hygiene and skin antiseptic. A timeout was performed prior to the initiation of the procedure. Ultrasound survey of the right inguinal region was performed with images stored and sent to PACs. A single wall puncture needle was used access the right common femoral artery under ultrasound guidance with saline syringe. With venous blood flow returned, a Bentson wire was observed to enter the common femoral  vein and the IVC under fluoroscopy. The needle was removed, and a small incision was made, and 9 [place] dilator was passed over the Bentson wire. A 10 French TIPS sheath was then passed over the Bentson wire after manually creating a significant leftward current on the catheter. The inner dilator and wire were removed, and a 5 [place] cobra catheter was passed over the Bentson wire into the IVC. Cobra catheter was used to select the left renal vein orifice. Combination of the Bentson wire, Glidewire, Rosen wire were used to gain purchased into the renal vein, with renal vein limited venogram performed to identify  the in flowing gastro renal shunt. With a safety curve on a stiff Amplatz wire position into superior pole renal vein, the TIPS sheath was advanced into the left renal vein over the Cobra catheter. Pullback reinforced straight sheath selection technique (PRESSS) was performed, and as the tip sheath was withdrawn, contrast was used to identified the inflow of the gastro renal shunt. With the coaxial Amplatz wire in position, a combination of a standard [place] catheter and a 4 French angled glide catheter were used to cannulate the orifice of the gastro renal shunt flowing shunt opposite the adrenal vein inflow. Once the Glidewire was advanced into the shunt, the catheter was advanced, and the Glidewire was exchanged for a stiff Amplatz wire with a safety curve on the tip. The renal vein buddy wire was withdrawn, and the TIPS sheath was advanced into the gastro renal shunt. As the 10 [place] TIPS sheath was clearly not long enough at 35 cm, a 45 cm 12 French sheath was placed. This sheath was then advanced into the most inferior aspects of the shunt orifice into the renal vein, though would not advance further given the length. Once the glide catheter and the Glidewire were in a reasonable location within the shunt, the catheter was withdrawn and then a balloon occlusion catheter was advanced over the Amplatz  wire into the gastro renal shunt. Amplatz wire was then withdrawn. Angiogram was then performed to confirmed location within the gastro renal shunt outflow. Initial balloon inflation did not achieve stasis of the shunt, and the balloon was deflated with repositioning needed. There was difficulty passing the [place] balloon catheter into a more distal position of the shunt, likely secondary to tortuosity and/or venous web. Exchange for an 8 [place] Merci balloon, 95 cm, was performed on an Amplatz wire. A more distal position within the shunt was achieved. Balloon on the [facility] was inflated on the balloon occlusion catheter and was withdrawn to the first encountered web to achieve hemostasis. Angiogram confirmed stasis of flow. A CO2 angiogram was also performed, identifying the posterior gastric veins as the stopping point. A penumbra Lantern microcatheter and a fathom wire were navigated into the shunt. Once we confirmed location within the outflow with standard angiogram, treatment was initiated. A foamed solution of detergent/sclerosant was mixed in a standard Tessari method, with 1:2:3 ratio of ethiodized oil: sodium tetradecyl sulfate: Room air. The sclerosed sent was infused under fluoroscopic guidance, with observation of reflux into the afferent veins. Once reflux was identified into the posterior gastric vein, sclerosing was completed. This required only approximately 15 cc of foam sclerotherapy in the above ratio. Once the posterior gastric veins were identified, we stop treatment. The micro catheter was withdrawn to the tip of the balloon occlusion catheter, and coil mass was deposited with deployment of multiple Ruby coils. This was performed with 24 by 60 cm coil, 28 x 60 cm coil, and 3 by 32 x 60 cm coil. The balloon was deflated under fluoroscopy, with no motion identified. The Merci balloon was slightly withdrawn into the more distal shunt (closer to the renal vein) and angiogram was  performed confirming complete stasis of flow beyond the coil mass. Once we were confident there was no migration, balloon was withdrawn and the sheath was withdrawn. Hemostasis was achieved with manual compression. Patient remained hemodynamically stable throughout. No complications were encountered and no significant blood loss was encountered. IMPRESSION: Status post ultrasound guided access right common femoral vein for coil-assisted BRTO (sclerotherapy) as treatment  for life-threatening gastric variceal hemorrhage. Signed, Dulcy Fanny. Dellia Nims, [place] [facility] Special[facility] Radiology Plan PLAN: Observe in PACU, then returned ICU Continue current management including serial H and H Local wound care management at the common femoral vein access site Repeat CT angiogram abdomen/pelvis after 24 hours-48 hours on this admission Follow-up vascular Interventional Radiology Clinic in 4-8 weeks Electronically Signed   By: Corrie Mckusick D.O.   On: 11/22/2019 14:32   DG Chest Port 1 View  Result Date: 11/24/2019 CLINICAL DATA:  Abnormal breath sounds EXAM: PORTABLE CHEST 1 VIEW COMPARISON:  Chest x-ray dated 11/22/2011 FINDINGS: The right-sided central venous catheter is stable in positioning. The heart size is enlarged. There are new small bilateral pleural effusions. There are new bibasilar airspace opacities favored to represent postoperative atelectasis. IMPRESSION: 1. New small bilateral pleural effusions and bibasilar airspace opacities favored to represent postoperative atelectasis. 2. Stable right central venous catheter. Electronically Signed   By: Constance Holster M.D.   On: 11/24/2019 21:39   IR EMBO ART  VEN HEMORR LYMPH EXTRAV  INC GUIDE ROADMAPPING  Result Date: 11/25/2019 CLINICAL DATA:  53 year old male with a history of life threatening of bleeding secondary to portal hypertension and gastric varices. The patient is status post balloon occlusion retrograde  trans venous obliteration (BRTO) on 11/22/2019, with recurrent hemorrhage confirmed by upper endoscopy EXAM: ULTRASOUND-GUIDED TRANS HEPATIC PORTAL VEIN ACCESS ULTRASOUND GUIDED ACCESS RIGHT JUGULAR VEIN. TIPS PLACEMENT COIL AND PLUG EMBOLIZATION OF MULTIPLE PORTAL VEIN TRIBUTARIES TO A PERSISTING ESOPHAGOGASTRIC VARICEAL NETWORK MEDICATIONS: As antibiotic prophylaxis, 2 G ANCEF was ordered pre-procedure and administered intravenously within one hour of incision. One unit of platelets was also administered IV during the procedure. ANESTHESIA/SEDATION: General - as administered by the Anesthesia department Vascular Interventional Radiology physicians: Dr. Earleen Newport, Dr. Kathlene Cote, Dr. Laurence Ferrari CONTRAST:  87m OMNIPAQUE IOHEXOL 300 MG/ML SOLN, 769mOMNIPAQUE IOHEXOL 300 MG/ML SOLN, 7041mMNIPAQUE IOHEXOL 300 MG/ML SOLN FLUOROSCOPY TIME:  Fluoroscopy Time: 66 minutes 36 seconds (6280 mGy). COMPLICATIONS: None PROCEDURE: Informed written consent was obtained from the patient's family after a thorough discussion of the procedural risks, benefits and alternatives. Specific risks discussed with TIPS/variceal embolization included: Bleeding, infection, vascular injury, need for further procedure/surgery, renal injury/renal failure, contrast reaction, non-target embolization, liver dysfunction/failure, hepatic encephalopathy, stroke (~1%), cardiopulmonary collapse, death. All questions were addressed. Maximal Sterile Barrier Technique was utilized including caps, mask, sterile gowns, sterile gloves, sterile drape, hand hygiene and skin antiseptic. A timeout was performed prior to the initiation of the procedure. Patient was positioned supine position on the table, with the right upper quadrant and the right neck prepped and draped in the usual sterile fashion. Ultrasound survey of the right upper quadrant was performed, with images stored and sent to PACs. Using ultrasound guidance, Chiba needle was used access the right  portal system. Once we confirmed position in the portal system with portal venography, micro wire was advanced into the inferior mesenteric vein. The inner dilator was advanced over the metal stiffener into the portal system, with the inner dilator advanced over the wire. Venogram confirmed are location within the portal system. The micro wire was then replaced into the inner dilator, with the transition point position at the apex of the catheter. Check flow vial was placed on the back and of the inner dilator. Ultrasound survey was then performed of the right neck, with images stored and sent to PACs. Ultrasound guidance was then used to access the right internal jugular vein with a  micro puncture kit. The wire was advanced under fluoroscopy into the right atrium, and a small incision was made. The needle was removed and the dilator was placed. The micro wire in the stiffener were removed and an 035 wire was then passed into the inferior vena cava. Ten French soft tissue dilation was performed on the wire, and then 10 [place] TIPS sheath was placed. Combination of Benson wire and multipurpose angled catheter were used to select the right hepatic vein. During the selection the right hepatic vein, accessory right hepatic vein was accessed with venography. Venogram confirmed location within the right hepatic vein. Tip sheath was then advanced over the catheter with a coaxial Amplatz wire, for a position cephalad to the transition point of the wire in the portal venous system. The Colapinto needle was then advanced through the TIPS sheath housed in the Teflon sheath over the Amplatz wire. Using oblique projections, only a single pass was required to access the portal venous system. Of note, the liver was very dense, with difficulty penetrating the portal vein wall with the colapinto needle. Access into the portal venous system was confirmed by aspirating portal venous blood and a venogram. A stiff Glidewire was then  used to navigate through the portal vein, ultimately with purchase in the IMV. Needle was removed. The stock 5 French glide catheter was attempted to pass over the Glidewire into the portal system. The tip would not penetrate the portal vein wall. A 4 French straight glide cath was then advanced on the Glidewire, and with forward tension, ultimately the glide cath penetrated the portal vein wall, with good purchase into the portal vein. Venogram confirmed location. Amplatz wire was then passed into the splenic vein through the Glidewire. The next step of dilating the soft tissue tract with series of balloons was quite difficult given the density of the liver. Initially a 4 mm by 40 mm balloon was successful passing into the portal vein with dilation of the soft tissue tract. Even after 4 mm balloon angioplasty, the sheath would not advanced over the Amplatz wire and balloon into the portal system, encountering resistance at the wall of the portal vein. 5 mm balloon angioplasty was then performed, with a similar resistance to passing the sheath into the portal vein. We attempted to pass the sheath with the coaxial dilator/introducer which was unsuccessful. 6 mm balloon angioplasty along the length of the tissue tract was ultimately successful, only after several reposition of the Amplatz wire into the distal splenic vein with the use of the glide catheter. Once the tip sheath was in the portal system, we confirmed location with angiogram. We selected a 10 mm x 2 x 8 viator stent, which was deployed and post dilated to 10 mm. Venogram confirm excellent flow through the TIPS shunt. Pigtail catheter was then advanced into the splenic vein with repeat portal venogram. This demonstrated significant perfusion of the gastroesophageal network via left gastric, posterior gastric, and accessory posterior gastric contribution. We proceeded with embolization. Angled multipurpose catheter was used to select the largest of the  posterior gastric veins. Coil embolization then proceeded with fibered 035 interlock coils, using multiple 10 mm diameter, 8 mm diameter, and 6 mm diameter coils. Once the flow was static in the first posterior gastric vein contribution, the catheter was withdrawn and repeat venogram was performed demonstrating accessory posterior gastric vein. This vein was selected with the 035 multipurpose catheter and coil embolized with fibered interlock coils, as well as a Amplatzer 4 plug.  Repeat splenic vein/portal vein venogram demonstrated occlusion of enlarged short gastric vein after the sclerotherapy treatment, with persisting left gastric vein contribution to the gastroesophageal network. The multipurpose catheter was then used to select these left gastric vein contributors, which were then coil embolized/plug embolized with a combination of fibrin coils and Amplatzer 4. Final angiogram was performed. The access in the right portal system was removed. Access in the right internal jugular vein was removed with hemostasis achieved. Sterile bandage was placed. Patient tolerated the procedure well and remained hemodynamically stable throughout. No complications were encountered and no significant blood loss. FINDINGS: Ultrasound guided access right internal jugular vein demonstrates wide patency. Creation of the portal systemic shunt demonstrated a significantly dense liver parenchyma, of which even the colapinto needle had difficulty crossing. Excellent flow through the TIPS shunt after creation, with 10 mm by 2 x 8 stent placed, dilated to 10 mm. Multiple portal contribution identified from the portal and splenic vein including posterior gastric, accessory posterior gastric, left gastric, and accessory left gastric venous inflow into a persisting gastroesophageal varices network. Each of these was coil embolized/plug embolized to stasis with no persisting flow into the network upon completion. The preferential portal  venous contribution that was treated during the balloon occlusion retrograde trans venous ablation on Sunday was a large short gastric vein which remains occluded on today's study. Persisting changes of prior sclerotherapy in the gastric variceal system. IMPRESSION: Status post TIPS creation and treatment of persisting portal contribution to gastroesophageal variceal network by combination of coil and plug embolization to stasis. Signed, Dulcy Fanny. Dellia Nims, RPVI [facility] Radiology Electronically Signed   By: Corrie Mckusick D.O.   On: 11/25/2019 13:21   IR EMBO ART  VEN HEMORR LYMPH EXTRAV  INC GUIDE ROADMAPPING  Result Date: 11/22/2019 CLINICAL DATA:  53 year old male with alcoholic cirrhosis and upper at GI hemorrhage, transferred from [facility] with gastric variceal hemorrhage, status post endoscopic identification and clipping. He has continued to have GI blood loss with need for pressor support, and presents now for urgent treatment with BRTO and/or TIPS EXAM: ULTRASOUND GUIDED ACCESS RIGHT COMMON FEMORAL VEIN LEFT RENAL VEIN ANGIOGRAM RETROGRADE SCLEROTHERAPY OF GASTRIC VARICES, ASSISTED WITH COIL EMBOLIZATION MEDICATIONS: As antibiotic prophylaxis, 2 G ANCEF was ordered pre-procedure and administered intravenously within one hour of incision. One unit of platelets was also administered IV during the procedure. ANESTHESIA/SEDATION: General - as administered by the Anesthesia department CONTRAST:  90 cc IV contrast FLUOROSCOPY TIME:  Fluoroscopy Time: 19 minutes 42 seconds (12/29/2041 mGy). COMPLICATIONS: None PROCEDURE: Informed written consent was obtained from the patient and the patient's family after a thorough discussion of the procedural risks, benefits and alternatives. Specific risks that were addressed regarding BRTO include, bleeding, infection, contrast reaction, kidney injury, need for further procedure or surgery including TIPS and/  or endoscopy, pulmonary embolism, stroke, worsening of ascites, worsening of esophageal varices, paradoxical liver failure, varix rupture, cardiopulmonary collapse, death. All questions were addressed. Maximal Sterile Barrier Technique was utilized including caps, mask, sterile gowns, sterile gloves, sterile drape, hand hygiene and skin antiseptic. A timeout was performed prior to the initiation of the procedure. Ultrasound survey of the right inguinal region was performed with images stored and sent to PACs. A single wall puncture needle was used access the right common femoral artery under ultrasound guidance with saline syringe. With venous blood flow returned, a Bentson wire was observed to enter the common femoral vein and the IVC under  fluoroscopy. The needle was removed, and a small incision was made, and 9 [place] dilator was passed over the Bentson wire. A 10 French TIPS sheath was then passed over the Bentson wire after manually creating a significant leftward current on the catheter. The inner dilator and wire were removed, and a 5 [place] cobra catheter was passed over the Bentson wire into the IVC. Cobra catheter was used to select the left renal vein orifice. Combination of the Bentson wire, Glidewire, Rosen wire were used to gain purchased into the renal vein, with renal vein limited venogram performed to identify the in flowing gastro renal shunt. With a safety curve on a stiff Amplatz wire position into superior pole renal vein, the TIPS sheath was advanced into the left renal vein over the Cobra catheter. Pullback reinforced straight sheath selection technique (PRESSS) was performed, and as the tip sheath was withdrawn, contrast was used to identified the inflow of the gastro renal shunt. With the coaxial Amplatz wire in position, a combination of a standard Kumpe catheter and a 4 French angled glide catheter were used to cannulate the orifice of the gastro renal shunt flowing shunt opposite the  adrenal vein inflow. Once the Glidewire was advanced into the shunt, the catheter was advanced, and the Glidewire was exchanged for a stiff Amplatz wire with a safety curve on the tip. The renal vein buddy wire was withdrawn, and the TIPS sheath was advanced into the gastro renal shunt. As the 10 [place] TIPS sheath was clearly not long enough at 35 cm, a 45 cm 12 French sheath was placed. This sheath was then advanced into the most inferior aspects of the shunt orifice into the renal vein, though would not advance further given the length. Once the glide catheter and the Glidewire were in a reasonable location within the shunt, the catheter was withdrawn and then a balloon occlusion catheter was advanced over the Amplatz wire into the gastro renal shunt. Amplatz wire was then withdrawn. Angiogram was then performed to confirmed location within the gastro renal shunt outflow. Initial balloon inflation did not achieve stasis of the shunt, and the balloon was deflated with repositioning needed. There was difficulty passing the [place] balloon catheter into a more distal position of the shunt, likely secondary to tortuosity and/or venous web. Exchange for an 8 [place] Merci balloon, 95 cm, was performed on an Amplatz wire. A more distal position within the shunt was achieved. Balloon on the [facility] was inflated on the balloon occlusion catheter and was withdrawn to the first encountered web to achieve hemostasis. Angiogram confirmed stasis of flow. A CO2 angiogram was also performed, identifying the posterior gastric veins as the stopping point. A penumbra Lantern microcatheter and a fathom wire were navigated into the shunt. Once we confirmed location within the outflow with standard angiogram, treatment was initiated. A foamed solution of detergent/sclerosant was mixed in a standard Tessari method, with 1:2:3 ratio of ethiodized oil: sodium tetradecyl sulfate: Room air. The sclerosed sent was infused under  fluoroscopic guidance, with observation of reflux into the afferent veins. Once reflux was identified into the posterior gastric vein, sclerosing was completed. This required only approximately 15 cc of foam sclerotherapy in the above ratio. Once the posterior gastric veins were identified, we stop treatment. The micro catheter was withdrawn to the tip of the balloon occlusion catheter, and coil mass was deposited with deployment of multiple Ruby coils. This was performed with 24 by 60 cm coil, 28 x 60 cm coil,  and 3 by 32 x 60 cm coil. The balloon was deflated under fluoroscopy, with no motion identified. The Merci balloon was slightly withdrawn into the more distal shunt (closer to the renal vein) and angiogram was performed confirming complete stasis of flow beyond the coil mass. Once we were confident there was no migration, balloon was withdrawn and the sheath was withdrawn. Hemostasis was achieved with manual compression. Patient remained hemodynamically stable throughout. No complications were encountered and no significant blood loss was encountered. IMPRESSION: Status post ultrasound guided access right common femoral vein for coil-assisted BRTO (sclerotherapy) as treatment for life-threatening gastric variceal hemorrhage. Signed, Dulcy Fanny. Dellia Nims, [place] [facility] Radiology Plan PLAN: Observe in PACU, then returned ICU Continue current management including serial H and H Local wound care management at the common femoral vein access site Repeat CT angiogram abdomen/pelvis after 24 hours-48 hours on this admission Follow-up vascular Interventional Radiology Clinic in 4-8 weeks Electronically Signed   By: Corrie Mckusick D.O.   On: 11/22/2019 14:32   CT Angio Abd/Pel w/ and/or w/o  Result Date: 11/24/2019 CLINICAL DATA:  Status post BRTO, GI bleeding EXAM: CTA ABDOMEN AND PELVIS WITH CONTRAST TECHNIQUE: Multidetector CT imaging of the abdomen and pelvis  was performed using the standard protocol during bolus administration of intravenous contrast. Multiplanar reconstructed images and MIPs were obtained and reviewed to evaluate the vascular anatomy. CONTRAST:  135m OMNIPAQUE IOHEXOL 350 MG/ML SOLN COMPARISON:  11/22/2019 FINDINGS: VASCULAR Aorta: Aorta atherosclerotic without significant aneurysm, dissection or occlusive process. No retroperitoneal hemorrhage or hematoma. Celiac: Main celiac origin is widely patent including its splenic and right hepatic branches. Separate origin of the left gastric and left hepatic artery also appearing patent. SMA: SMA origin is patent. Renals: Main renal arteries are widely patent. IMA: Small caliber IMA remains patent off the aorta. Inflow: Iliac atherosclerosis noted without significant inflow disease or occlusion. Proximal Outflow: Atherosclerotic changes of the proximal outflow. The right common femoral and proximal profunda femoral artery are both patent. Right proximal SFA occlusion noted. Left common femoral, profunda femoral, proximal SFA visualized are all patent. Veins: Dedicated venous imaging not performed. Interval balloon retrograde trans obliteration of the gastric varices. Gastric varices appear largely occluded with radiopaque sclerotherapy and coils noted. Scattered areas non target radiopaque sclerotherapy within small branches of the short gastric veins about the spleen in the left upper quadrant. Overall limited assessment because venous phase imaging was not performed. Review of the MIP images confirms the above findings. NON-VASCULAR Lower chest: Increased dependent basilar atelectasis and very small left effusion. Normal heart size. No pericardial effusion. Native coronary atherosclerosis. Hepatobiliary: Hepatic cirrhosis again evident with heterogeneous attenuation and micro nodularity to the surface. Limited assessment because of arterial phase imaging only. Small left hepatic dome subcentimeter probable  cyst noted. Increased perihepatic ascites. Moderate gallbladder distension with layering gallstones again evident. No gross biliary obstruction or dilatation. Common bile duct nondilated. Pancreas: Unremarkable. No pancreatic ductal dilatation or surrounding inflammatory changes. Spleen: Stable mild splenomegaly.  Increased perisplenic ascites. Adrenals/Urinary Tract: Normal adrenal glands. No renal obstruction or hydronephrosis. No hydroureter or ureteral calculus. Bladder unremarkable. Stomach/Bowel: Negative for bowel obstruction, significant dilatation ileus or free air. Portions of the appendix are demonstrated and appear unremarkable. Increased stranding mesenteric edema. Exam of the bowel is limited but there is slight diffuse colonic bowel wall thickening suggesting mild colitis. New small amount abdominopelvic ascites. Lymphatic: No bulky adenopathy. Reproductive: No acute or significant finding by CT. Other: No  inguinal or abdominal wall hernia. Increased mild diffuse lower body wall pelvic subcutaneous edema compatible with anasarca. Musculoskeletal: No acute osseous finding. Bilateral L5 pars defects noted. No anterolisthesis associated. Acute compression fracture. IMPRESSION: VASCULAR No evidence of significant CTA arterial GI bleeding. 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 Small amount of scattered non target radiopaque sclerotherapy within the perisplenic short gastric veins noted. Aortoiliac atherosclerosis without aneurysm or occlusive process Mesenteric and renal vasculature appear patent centrally. Chronic appearing proximal right SFA occlusion NON-VASCULAR Minor basilar atelectasis and trace left effusion Hepatic cirrhosis with developing mesenteric edema, new small amount of abdominopelvic ascites, and body anasarca. Cholelithiasis Nonspecific mild colonic wall thickening. Difficult to exclude associated pancolitis. Electronically Signed   By: Jerilynn Mages.  Shick  M.D.   On: 11/24/2019 13:09   CT Angio Abd/Pel w/ and/or w/o  Result Date: 11/22/2019 CLINICAL DATA:  GI bleed with large bloody bowel movement EXAM: CTA ABDOMEN AND PELVIS WITHOUT AND WITH CONTRAST TECHNIQUE: Multidetector CT imaging of the abdomen and pelvis was performed using the standard protocol during bolus administration of intravenous contrast. Multiplanar reconstructed images and MIPs were obtained and reviewed to evaluate the vascular anatomy. CONTRAST:  100 mL Omnipaque 350. COMPARISON:  06/02/2018 FINDINGS: VASCULAR Aorta: Abdominal aorta is well visualize without aneurysmal dilatation or focal dissection. Minimal atherosclerotic changes are noted. Celiac: Celiac axis is widely patent. The main celiac axis supplies the splenic artery as well as the right hepatic artery. Variant anatomy is noted with a smaller branch arising directly from the aorta just above the celiac axis supplying the left hepatic artery as well as left gastric artery. SMA: Patent without evidence of aneurysm, dissection, vasculitis or significant stenosis. Renals: Both renal arteries are patent without evidence of aneurysm, dissection, vasculitis, fibromuscular dysplasia or significant stenosis. IMA: Patent without evidence of aneurysm, dissection, vasculitis or significant stenosis. Iliacs: Atherosclerotic changes are noted without focal stenosis Veins: IVC appears within normal limits. Multiple gastric varices are identified near the gastroesophageal junction and along the posterior aspect of the gastric fundus. The largest of these varices measures at least 1.8 cm in greatest dimension. No significant ascending esophageal varices are noted. The gastric varices seen drain into the left renal vein. No other venous abnormality is noted. Portal vein is widely patent. Small coronary vein is seen. Review of the MIP images confirms the above findings. NON-VASCULAR Lower chest: No acute abnormality. Hepatobiliary: Liver is well  visualized. Mild heterogeneity of the liver is seen without focal mass. Very mild nodularity is noted. The gallbladder is well distended with dependent gallstones. No complicating factors are seen. Mild perihepatic fluid is noted new from prior exam. Pancreas: Pancreas appears within normal limits. Spleen: Spleen shows no focal mass lesion. Splenic venous branches communicate with the gastric varices as well. Adrenals/Urinary Tract: The adrenal glands are within normal limits. Kidneys are well visualized bilaterally. No renal calculi or obstructive changes are seen. Delayed images demonstrate a normal enhancement pattern as well. The bladder is partially distended. Stomach/Bowel: Colon is well visualized. Scattered diverticular change is seen without evidence of diverticulitis. The appendix appears within normal limits. Small bowel is unremarkable. No area to suggest active hemorrhage in the small bowel or colon is seen. Stomach is well distended with water administered right before the exam. Some swirling of air and density is noted within the dependent portion of the stomach. This could be related to thrombus given the known gastric varices. Endoscopic clip is noted adjacent to region of  varices posteriorly. Lymphatic: No significant lymphadenopathy is noted. Reproductive: Prostate is unremarkable. Other: No abdominal wall hernia or abnormality. Minimal perihepatic fluid is noted as previously mentioned. Musculoskeletal: Bilateral pars defects are noted at L5 with only minimal anterolisthesis. No acute bony abnormality is noted. IMPRESSION: VASCULAR Variant arterial anatomy of the celiac axis as described above. Changes consistent with gastric varices intercommunicating between the splenic vein and left renal vein. The draining vein into the left renal vein measures approximately for 1.4 x 1.0 cm in diameter. Aortic Atherosclerosis (ICD10-I70.0). NON-VASCULAR Mild heterogeneity of the liver is noted with  nodularity consistent with underlying cirrhosis. Diverticulosis without evidence of diverticulitis. Soft tissue density in the dependent portion of the stomach with air within. Given its proximity to the gastric varices along the posterior aspect of the gastric fundus this may represent thrombus. This area lies in an area of previous endoscopic clipping. No areas of active extravasation in the large or small bowel are noted. Electronically Signed   By: Inez Catalina M.D.   On: 11/22/2019 00:35     Phillips Climes M.D on 11/27/2019 at 1:28 PM  Between 7am to 7pm - Pager - [phone]  After 7pm go to [email or web address] - password [facility]  [facility] -  Office  [phone]

## 2019-11-27 NOTE — TOC Initial Note (Signed)
Transition of Care [facility]) - Initial/Assessment Note    Patient Details  Name: Bradley Miles MRN: [id number] Date of Birth: [birth year]  Transition of [facility]) CM/SW Contact:    Alexander Mt, Wallace Phone Number: 11/27/2019, 2:42 PM  Clinical Narrative:                 CSW met with pt and pt wife at bedside. Introduced self, role, reason for visit. Pt states he is feeling better this morning. He is from home with spouse, Bradley. They live in a one level home, with a finished  basement with laundry. Pt confirms PCP and cardiology primary MD. We discussed getting PT/OT ordered in order to assess for any further therapy needs and DME (pt ambulates independently at baseline but is feeling weaker than baseline).   CSW let pt know that I had been consulted to discuss supportive resources for ETOH cessation. Pt gave permission for wife to remain in room while we discussed. Pt has had two stays at [place] in the past. CSW provided list of substance use cessation providers that [facility] utilizes. We discussed that often it takes multiple attempts at cessation to find a place and program that allows you to develop the mechanisms you need for cessation and/or sobriety.   We discussed inpatient (pt familiar) challenges due to COVID (lower numbers of beds/telehealth) and intensive outpatient provider information. Pt wife inquired about whether or not providers would be able to describe their program structure- as CSW is not familiar with each programs level of structure encouraged her and pt to call together to discuss programs. Pt committed to cessation- we discussed that this hospitalization has been a turning point of realizing how serious cessation is to his health. CSW provided encouragement that pt may need to process the impact of this stay also with a counselor while discussing his use.   CSW let pt and pt wife know what our [facility] team structure looks like- pt/pt wife are aware that CSW and  RNCM will work together to arrange whatever f/u care is recommended.  Pt and wife aware we remain available for f/u questions.   CSW secure messaged MD to request PT/OT evaluations. TOC team continues to follow.   Expected Discharge Plan: Home/Self Care Barriers to Discharge: Continued Medical Work up   Patient Goals and CMS Choice Patient states their goals for this hospitalization and ongoing recovery are:: to feel better and find a program for alcohol cessation that works for me CMS Medicare.gov Compare Post Acute Care list provided to:: (n/a at this time) Choice offered to / list presented to : Spouse, Patient  Expected Discharge Plan and Services Expected Discharge Plan: Home/Self Care In-house Referral: Clinical Social Work Discharge Planning Services: CM Consult Post Acute Care Choice: (tbd; pt provided ETOH resources) Living arrangements for the past 2 months: Single Family Home    Prior Living Arrangements/Services Living arrangements for the past 2 months: Single Family Home Lives with:: Spouse Patient language and need for interpreter reviewed:: Yes(no needs) Do you feel safe going back to the place where you live?: Yes      Need for Family Participation in Patient Care: Yes (Comment)(support, supervision as needed) Care giver support system in place?: Yes (comment)(pt wife and pt sister)   Criminal Activity/Legal Involvement Pertinent to Current Situation/Hospitalization: No - Comment as needed  Activities of Daily Living Home Assistive Devices/Equipment: Other (Comment)(uses a scooter at work) ADL Screening (condition at time of admission) Patient's cognitive  ability adequate to safely complete daily activities?: Yes Is the patient deaf or have difficulty hearing?: No Does the patient have difficulty seeing, even when wearing glasses/contacts?: No Does the patient have difficulty concentrating, remembering, or making decisions?: No Patient able to express need for  assistance with ADLs?: Yes Does the patient have difficulty dressing or bathing?: No Independently performs ADLs?: Yes (appropriate for developmental age) Does the patient have difficulty walking or climbing stairs?: No Weakness of Legs: Both(pt has neuropathy) Weakness of Arms/Hands: None  Permission Sought/Granted Permission sought to share information with : Family Supports, PCP Permission granted to share information with : Yes, Verbal Permission Granted  Share Information with NAME: Bradley Miles     Permission granted to share info w Relationship: wife  Permission granted to share info w Contact Information: [phone]  Emotional Assessment Appearance:: Appears stated age Attitude/Demeanor/Rapport: Engaged, Gracious Affect (typically observed): Accepting, Adaptable, Appropriate, Pleasant Orientation: : Oriented to Self, Oriented to Place, Oriented to  Time, Oriented to Situation Alcohol / Substance Use: Alcohol Use Psych Involvement: Outpatient Provider  Admission diagnosis:  ETOH abuse [F10.10] Hematemesis/vomiting blood [K92.0] Hematemesis with nausea [K92.0] Patient Active Problem List   Diagnosis Date Noted  . Acute blood loss anemia   . ETOH abuse   . Bleeding esophageal varices ([place])   . Hematemesis/vomiting blood 11/20/2019  . RUQ pain 07/30/2019  . Abnormal LFTs 07/30/2019  . Bilateral leg edema 06/03/2019  . Portal hypertension ([place]) 11/26/2018  . Hepatic steatosis 06/27/2018  . Abdominal pain   . Alcoholic hepatitis without ascites   . Hepatic encephalopathy ([place]) 06/01/2018  . Acute respiratory failure with hypoxia ([place]) 06/01/2018  . Alcoholic cirrhosis of liver with ascites ([place]) 06/01/2018  . COPD with acute exacerbation ([place]) 06/01/2018  . Alcoholic peripheral neuropathy ([place]) 06/01/2018  . Increased ammonia level   . COPD, mild ([place]) 05/14/2017  . Right leg claudication ([place]) 05/14/2017  . Prediabetes 02/04/2017  . Chest pain 12/21/2015  . Right  leg DVT ([place]) 07/18/2015  . Elevated transaminase measurement 07/18/2015  . Sensory neuropathy 07/16/2015  . Anxiety 12/23/2014  . EtOH dependence ([place]) 02/09/2014  . Headache in back of head 01/12/2014  . Superficial thrombosis of lower extremity 07/01/2013  . Hematochezia 11/17/2012  . Mixed hyperlipidemia 02/15/2012  . Hypertension 01/18/2012  . Insomnia 01/18/2012  . Family history of colonic polyps 11/15/2011  . Palpitations 10/04/2011  . TOBACCO ABUSE 05/18/2010  . GERD 05/18/2010  . CONSTIPATION 05/18/2010  . 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 DYSPHAGIA UNSPECIFIED 05/18/2010  . ABDOMINAL PAIN, RIGHT LOWER QUADRANT 05/18/2010   PCP:  [name], MD Pharmacy:   [facility] [phone] - [place], [place] [address] [place] [address] Phone: [phone] Fax: [address], [place] [address] [place] [address] Phone: [phone] Fax: [phone]    Readmission Risk Interventions Readmission Risk Prevention Plan 11/27/2019  Transportation Screening Complete  PCP or Specialist Appt within 5-7 Days Not Complete  Not Complete comments pending medical stability  Home Care Screening Complete  Medication Review (RN CM) Complete  Some recent data might be hidden

## 2019-11-27 NOTE — Plan of Care (Signed)
  Problem: Education: Goal: Knowledge of General Education information will improve Description Including pain rating scale, medication(s)/side effects and non-pharmacologic comfort measures Outcome: Progressing   

## 2019-11-27 NOTE — Progress Notes (Signed)
Subjective: Patient states he is doing well.  He had brown stools today.  He is able to tolerate regular diet.  He complains of abdominal distention and bloating.  Objective: Vital signs in last 24 hours: Temp:  [97.9 F (36.6 C)-98.2 F (36.8 C)] 98.1 F (36.7 C) (12/18 1529) Pulse Rate:  [99-109] 102 (12/18 1529) Resp:  [12-18] 14 (12/18 1529) BP: (117-150)/(57-80) 117/57 (12/18 1529) SpO2:  [90 %-97 %] 90 % (12/18 1529) Weight change:  Last BM Date: 11/26/19  PE: Appears comfortable, on oxygen via nasal cannula, able to speak in full sentences, not using accessory muscles of respiration GENERAL: Overweight, mild pallor, prominent icterus ABDOMEN: Distended but tympanic on percussion, normoactive bowel sounds, nontender EXTREMITIES: No deformity, no edema  Lab Results: Results for orders placed or performed during the hospital encounter of 11/19/19 (from the past 48 hour(s))  CBC     Status: Abnormal   Collection Time: 11/25/19  5:50 PM  Result Value Ref Range   WBC 13.4 (H) 4.0 - 10.5 K/uL   RBC 2.36 (L) 4.22 - 5.81 MIL/uL   Hemoglobin 7.7 (L) 13.0 - 17.0 g/dL   HCT 23.6 (L) 39.0 - 52.0 %   MCV 100.0 80.0 - 100.0 fL   MCH 32.6 26.0 - 34.0 pg   MCHC 32.6 30.0 - 36.0 g/dL   RDW 21.9 (H) 11.5 - 15.5 %   Platelets 105 (L) 150 - 400 K/uL    Comment: REPEATED TO VERIFY PLATELET COUNT CONFIRMED BY SMEAR Immature Platelet Fraction may be clinically indicated, consider ordering this additional test [id number]    nRBC 2.5 (H) 0.0 - 0.2 %    Comment: Performed at [facility], [address], [place], [place] [address]  CBC     Status: Abnormal   Collection Time: 11/26/19  3:47 AM  Result Value Ref Range   WBC 10.3 4.0 - 10.5 K/uL   RBC 2.39 (L) 4.22 - 5.81 MIL/uL   Hemoglobin 7.9 (L) 13.0 - 17.0 g/dL   HCT 23.8 (L) 39.0 - 52.0 %   MCV 99.6 80.0 - 100.0 fL   MCH 33.1 26.0 - 34.0 pg   MCHC 33.2 30.0 - 36.0 g/dL   RDW 22.1 (H) 11.5 - 15.5 %   Platelets 92 (L) 150 -  400 K/uL    Comment: Immature Platelet Fraction may be clinically indicated, consider ordering this additional test [id number] CONSISTENT WITH PREVIOUS RESULT    nRBC 1.9 (H) 0.0 - 0.2 %    Comment: Performed at [facility], [address] [place], [place] [address]  Comprehensive metabolic panel     Status: Abnormal   Collection Time: 11/26/19  3:47 AM  Result Value Ref Range   Sodium 141 135 - 145 mmol/L   Potassium 4.0 3.5 - 5.1 mmol/L   Chloride 108 98 - 111 mmol/L   CO2 29 22 - 32 mmol/L   Glucose, Bld 159 (H) 70 - 99 mg/dL   BUN 19 6 - 20 mg/dL   Creatinine, Ser 0.83 0.61 - 1.24 mg/dL   Calcium 7.0 (L) 8.9 - 10.3 mg/dL   Total Protein 4.1 (L) 6.5 - 8.1 g/dL   Albumin 1.8 (L) 3.5 - 5.0 g/dL   AST 208 (H) 15 - 41 U/L   ALT 77 (H) 0 - 44 U/L   Alkaline Phosphatase 80 38 - 126 U/L   Total Bilirubin 5.5 (H) 0.3 - 1.2 mg/dL   GFR calc non Af Amer >  60 >60 mL/min   GFR calc Af Amer >60 >60 mL/min   Anion gap 4 (L) 5 - 15    Comment: Performed at [facility] [address], [place], [place] [address]  CBC     Status: Abnormal   Collection Time: 11/27/19  4:10 AM  Result Value Ref Range   WBC 7.4 4.0 - 10.5 K/uL   RBC 2.43 (L) 4.22 - 5.81 MIL/uL   Hemoglobin 8.0 (L) 13.0 - 17.0 g/dL   HCT 25.3 (L) 39.0 - 52.0 %   MCV 104.1 (H) 80.0 - 100.0 fL   MCH 32.9 26.0 - 34.0 pg   MCHC 31.6 30.0 - 36.0 g/dL   RDW 22.9 (H) 11.5 - 15.5 %   Platelets 104 (L) 150 - 400 K/uL    Comment: REPEATED TO VERIFY Immature Platelet Fraction may be clinically indicated, consider ordering this additional test [id number] CONSISTENT WITH PREVIOUS RESULT    nRBC 1.4 (H) 0.0 - 0.2 %    Comment: Performed at [facility], [place] [address], [place], [place] [address]    Studies/Results: No results found.  Medications: I have reviewed the patient's current medications.  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  Continue lactulose and Xifaxan, discussed about importance with compliance with medication and  increased risk of hepatic encephalopathy or worsening after TIPS  Continue folic acid, multivitamin and thiamine. If patient remains stable, okay to DC home in the next 24 hours.  Ronnette Juniper, MD 11/27/2019, 3:38 PM

## 2019-11-27 NOTE — Progress Notes (Signed)
Referring Physician(s): CCM Dr Lynetta Mare Dr Cristina Gong and Dr Therisa Doyne  Supervising Physician: Aletta Edouard  Patient Status:  [facility] - In-pt  Chief Complaint:  History of bleeding gastric varices s/p BRTO 11/22/2019 by Dr. Earleen Newport; with evidence of active bleeding from gastric varices on EGD 11/24/2019 s/p TIPS 11/24/2019 by Dr. Kathlene Cote and Dr. Earleen Newport.  Subjective: Feeling better. No further bloody BMs per pt. Denies abd pain No more transfusions.    Allergies: Patient has no known allergies.  Medications:  Current Facility-Administered Medications:  .  0.9 %  sodium chloride infusion (Manually program via Guardrails IV Fluids), , Intravenous, Once, Ronnette Juniper, MD .  0.9 %  sodium chloride infusion (Manually program via Guardrails IV Fluids), , Intravenous, Once, Ronnette Juniper, MD .  0.9 %  sodium chloride infusion, 250 mL, Intravenous, Continuous, Ronnette Juniper, MD, Stopped at 11/22/19 [phone] .  Chlorhexidine Gluconate Cloth 2 % PADS 6 each, 6 each, Topical, Daily, Ronnette Juniper, MD, 6 each at 16/38/45 3646 .  folic acid (FOLVITE) tablet 1 mg, 1 mg, Oral, Daily, Agarwala, Ravi, MD, 1 mg at 11/27/19 0819 .  iohexol (OMNIPAQUE) 300 MG/ML solution 100 mL, 100 mL, Intravenous, Once PRN, Corrie Mckusick, DO .  lactulose (CHRONULAC) 10 GM/15ML solution 10 g, 10 g, Oral, BID, Ronnette Juniper, MD, 10 g at 11/27/19 0818 .  metoprolol tartrate (LOPRESSOR) injection 2.5 mg, 2.5 mg, Intravenous, Q6H PRN, Ogan, Okoronkwo U, MD, 2.5 mg at 11/25/19 0145 .  multivitamin with minerals tablet 1 tablet, 1 tablet, Oral, Daily, Ronnette Juniper, MD, 1 tablet at 11/27/19 [phone] .  ondansetron (ZOFRAN) tablet 4 mg, 4 mg, Oral, Q6H PRN **OR** ondansetron (ZOFRAN) injection 4 mg, 4 mg, Intravenous, Q6H PRN, Ronnette Juniper, MD, 4 mg at 11/24/19 2300 .  pantoprazole (PROTONIX) EC tablet 40 mg, 40 mg, Oral, BID, Agarwala, Ravi, MD, 40 mg at 11/27/19 0819 .  PARoxetine (PAXIL) tablet 20 mg, 20 mg, Oral, Daily, Ronnette Juniper, MD, 20  mg at 11/27/19 0819 .  rifaximin (XIFAXAN) tablet 550 mg, 550 mg, Oral, BID, Ronnette Juniper, MD, 550 mg at 11/27/19 0818 .  sodium chloride flush (NS) 0.9 % injection 10-40 mL, 10-40 mL, Intracatheter, PRN, Rush Farmer, MD .  sucralfate (CARAFATE) 1 GM/10ML suspension 1 g, 1 g, Oral, TID WC & HS, Ronnette Juniper, MD, 1 g at 11/27/19 0819 .  thiamine (VITAMIN B-1) tablet 100 mg, 100 mg, Oral, Daily, 100 mg at 11/27/19 0818 **OR** thiamine (B-1) injection 100 mg, 100 mg, Intravenous, Daily, Ronnette Juniper, MD, 100 mg at 11/25/19 1028    Vital Signs: BP 137/80 (BP Location: Left Arm)   Pulse (!) 105   Temp 98.2 F (36.8 C) (Oral)   Resp 17   Ht _0  (1.854 m)   Wt 119 kg   SpO2 97%   BMI 34.61 kg/m   Physical Exam Vitals reviewed.  Neck:     Comments: (R)IJ dressing dry. Abdominal:     Palpations: Abdomen is soft.     Tenderness: There is no abdominal tenderness.     Comments: RUQ puncture site clean  Musculoskeletal:        General: Normal range of motion.  Skin:    General: Skin is warm and dry.  Neurological:     General: No focal deficit present.     Mental Status: He is alert and oriented to person, place, and time.  Psychiatric:        Mood and Affect: Mood normal.  Behavior: Behavior normal.        Thought Content: Thought content normal.        Judgment: Judgment normal.     Imaging:  IR EMBO ART  VEN HEMORR LYMPH EXTRAV  INC GUIDE ROADMAPPING  Result Date: 11/25/2019 CLINICAL DATA:  53 year old male with a history of life threatening of bleeding secondary to portal hypertension and gastric varices. The patient is status post balloon occlusion retrograde trans venous obliteration (BRTO) on 11/22/2019, with recurrent hemorrhage confirmed by upper endoscopy EXAM: ULTRASOUND-GUIDED TRANS HEPATIC PORTAL VEIN ACCESS ULTRASOUND GUIDED ACCESS RIGHT JUGULAR VEIN. TIPS PLACEMENT COIL AND PLUG EMBOLIZATION OF MULTIPLE PORTAL VEIN TRIBUTARIES TO A PERSISTING ESOPHAGOGASTRIC  VARICEAL NETWORK MEDICATIONS: As antibiotic prophylaxis, 2 G ANCEF was ordered pre-procedure and administered intravenously within one hour of incision. One unit of platelets was also administered IV during the procedure. ANESTHESIA/SEDATION: General - as administered by the Anesthesia department Vascular Interventional Radiology physicians: Dr. Earleen Newport, Dr. Kathlene Cote, Dr. Laurence Ferrari CONTRAST:  13m OMNIPAQUE IOHEXOL 300 MG/ML SOLN, 792mOMNIPAQUE IOHEXOL 300 MG/ML SOLN, 7028mMNIPAQUE IOHEXOL 300 MG/ML SOLN FLUOROSCOPY TIME:  Fluoroscopy Time: 66 minutes 36 seconds (6280 mGy). COMPLICATIONS: None PROCEDURE: Informed written consent was obtained from the patient's family after a thorough discussion of the procedural risks, benefits and alternatives. Specific risks discussed with TIPS/variceal embolization included: Bleeding, infection, vascular injury, need for further procedure/surgery, renal injury/renal failure, contrast reaction, non-target embolization, liver dysfunction/failure, hepatic encephalopathy, stroke (~1%), cardiopulmonary collapse, death. All questions were addressed. Maximal Sterile Barrier Technique was utilized including caps, mask, sterile gowns, sterile gloves, sterile drape, hand hygiene and skin antiseptic. A timeout was performed prior to the initiation of the procedure. Patient was positioned supine position on the table, with the right upper quadrant and the right neck prepped and draped in the usual sterile fashion. Ultrasound survey of the right upper quadrant was performed, with images stored and sent to PACs. Using ultrasound guidance, Chiba needle was used access the right portal system. Once we confirmed position in the portal system with portal venography, micro wire was advanced into the inferior mesenteric vein. The inner dilator was advanced over the metal stiffener into the portal system, with the inner dilator advanced over the wire. Venogram confirmed are location within the  portal system. The micro wire was then replaced into the inner dilator, with the transition point position at the apex of the catheter. Check flow vial was placed on the back and of the inner dilator. Ultrasound survey was then performed of the right neck, with images stored and sent to PACs. Ultrasound guidance was then used to access the right internal jugular vein with a micro puncture kit. The wire was advanced under fluoroscopy into the right atrium, and a small incision was made. The needle was removed and the dilator was placed. The micro wire in the stiffener were removed and an 035 wire was then passed into the inferior vena cava. Ten French soft tissue dilation was performed on the wire, and then 10 [place] sheath was placed. Combination of Benson wire and multipurpose angled catheter were used to select the right hepatic vein. During the selection the right hepatic vein, accessory right hepatic vein was accessed with venography. Venogram confirmed location within the right hepatic vein. Tip sheath was then advanced over the catheter with a coaxial Amplatz wire, for a position cephalad to the transition point of the wire in the portal venous system. The Colapinto needle was then advanced through the TIPS sheath housed in  the Teflon sheath over the Amplatz wire. Using oblique projections, only a single pass was required to access the portal venous system. Of note, the liver was very dense, with difficulty penetrating the portal vein wall with the colapinto needle. Access into the portal venous system was confirmed by aspirating portal venous blood and a venogram. A stiff Glidewire was then used to navigate through the portal vein, ultimately with purchase in the IMV. Needle was removed. The stock 5 French glide catheter was attempted to pass over the Glidewire into the portal system. The tip would not penetrate the portal vein wall. A 4 French straight glide cath was then advanced on the Glidewire, and  with forward tension, ultimately the glide cath penetrated the portal vein wall, with good purchase into the portal vein. Venogram confirmed location. Amplatz wire was then passed into the splenic vein through the Glidewire. The next step of dilating the soft tissue tract with series of balloons was quite difficult given the density of the liver. Initially a 4 mm by 40 mm balloon was successful passing into the portal vein with dilation of the soft tissue tract. Even after 4 mm balloon angioplasty, the sheath would not advanced over the Amplatz wire and balloon into the portal system, encountering resistance at the wall of the portal vein. 5 mm balloon angioplasty was then performed, with a similar resistance to passing the sheath into the portal vein. We attempted to pass the sheath with the coaxial dilator/introducer which was unsuccessful. 6 mm balloon angioplasty along the length of the tissue tract was ultimately successful, only after several reposition of the Amplatz wire into the distal splenic vein with the use of the glide catheter. Once the tip sheath was in the portal system, we confirmed location with angiogram. We selected a 10 mm x 2 x 8 viator stent, which was deployed and post dilated to 10 mm. Venogram confirm excellent flow through the TIPS shunt. Pigtail catheter was then advanced into the splenic vein with repeat portal venogram. This demonstrated significant perfusion of the gastroesophageal network via left gastric, posterior gastric, and accessory posterior gastric contribution. We proceeded with embolization. Angled multipurpose catheter was used to select the largest of the posterior gastric veins. Coil embolization then proceeded with fibered 035 interlock coils, using multiple 10 mm diameter, 8 mm diameter, and 6 mm diameter coils. Once the flow was static in the first posterior gastric vein contribution, the catheter was withdrawn and repeat venogram was performed demonstrating accessory  posterior gastric vein. This vein was selected with the 035 multipurpose catheter and coil embolized with fibered interlock coils, as well as a Amplatzer 4 plug. Repeat splenic vein/portal vein venogram demonstrated occlusion of enlarged short gastric vein after the sclerotherapy treatment, with persisting left gastric vein contribution to the gastroesophageal network. The multipurpose catheter was then used to select these left gastric vein contributors, which were then coil embolized/plug embolized with a combination of fibrin coils and Amplatzer 4. Final angiogram was performed. The access in the right portal system was removed. Access in the right internal jugular vein was removed with hemostasis achieved. Sterile bandage was placed. Patient tolerated the procedure well and remained hemodynamically stable throughout. No complications were encountered and no significant blood loss. FINDINGS: Ultrasound guided access right internal jugular vein demonstrates wide patency. Creation of the portal systemic shunt demonstrated a significantly dense liver parenchyma, of which even the colapinto needle had difficulty crossing. Excellent flow through the TIPS shunt after creation, with 10 mm by  2 x 8 stent placed, dilated to 10 mm. Multiple portal contribution identified from the portal and splenic vein including posterior gastric, accessory posterior gastric, left gastric, and accessory left gastric venous inflow into a persisting gastroesophageal varices network. Each of these was coil embolized/plug embolized to stasis with no persisting flow into the network upon completion. The preferential portal venous contribution that was treated during the balloon occlusion retrograde trans venous ablation on Sunday was a large short gastric vein which remains occluded on today's study. Persisting changes of prior sclerotherapy in the gastric variceal system. IMPRESSION: Status post TIPS creation and treatment of persisting  portal contribution to gastroesophageal variceal network by combination of coil and plug embolization to stasis. Signed, Dulcy Fanny. Dellia Nims, RPVI [facility] Radiology Electronically Signed   By: Corrie Mckusick D.O.   On: 11/25/2019 13:21      Labs:  CBC: Recent Labs    11/25/19 0343 11/25/19 1200 11/25/19 1750 11/26/19 0347 11/27/19 0410  WBC 18.3*  --  13.4* 10.3 7.4  HGB 8.4* 7.8* 7.7* 7.9* 8.0*  HCT 24.9* 23.0* 23.6* 23.8* 25.3*  PLT 105*  --  105* 92* 104*    COAGS: Recent Labs    08/19/19 1304 11/19/19 2208 11/22/19 2304 11/25/19 1200  INR 1.1 1.3* 1.8* 1.6*  APTT  --  41*  --   --     BMP: Recent Labs    11/23/19 0829 11/24/19 0554 11/24/19 1717 11/24/19 1940 11/25/19 0343 11/26/19 0347  NA 141 140 141 141 142 141  K 4.0 4.2 5.6* 5.5* 5.0 4.0  CL 109 110  --   --  110 108  CO2 25 25  --   --  25 29  GLUCOSE 121* 156*  --   --  169* 159*  BUN 15 15  --   --  20 19  CALCIUM 6.5* 6.6*  --   --  6.7* 7.0*  CREATININE 0.90 1.12  --   --  1.15 0.83  GFRNONAA >60 >60  --   --  >60 >60  GFRAA >60 >60  --   --  >60 >60    LIVER FUNCTION TESTS: Recent Labs    11/22/19 0712 11/23/19 0829 11/25/19 0343 11/26/19 0347  BILITOT 5.8* 4.0* 7.5* 5.5*  AST 71* 77* 102* 208*  ALT 20 25 33 77*  ALKPHOS 104 85 67 80  PROT 4.6* 4.2* 4.1* 4.1*  ALBUMIN 1.7* 1.8* 1.8* 1.8*    Assessment and Plan: Gastric varices BRTO 11/22/19 TIPS 11/24/19 No further bloody BMs or transfusions since 12/15 Hgb up to 8.0 Plan per CCM/GI  Electronically Signed: Ascencion Dike, PA-C 11/27/2019, 10:32 AM   I spent a total of 15 Minutes at the the patient's bedside AND on the patient's hospital floor or unit, greater than 50% of which was counseling/coordinating care for BRTO 12/13; TIPS 12/15

## 2019-11-27 NOTE — Social Work (Signed)
CSW acknowledging consult for substance use. Will evaluate pt needs and interest in resources.   Westley Hummer, MSW, [place] [employer] [phone]

## 2019-11-28 ENCOUNTER — Inpatient Hospital Stay (HOSPITAL_COMMUNITY): Payer: Managed Care, Other (non HMO)

## 2019-11-28 LAB — TYPE AND SCREEN
ABO/RH(D): A NEG
Antibody Screen: NEGATIVE
Unit division: 0
Unit division: 0
Unit division: 0
Unit division: 0
Unit division: 0
Unit division: 0
Unit division: 0

## 2019-11-28 LAB — BPAM RBC
Blood Product Expiration Date: 202012252359
Blood Product Expiration Date: 202012252359
Blood Product Expiration Date: 202012252359
Blood Product Expiration Date: 202012252359
Blood Product Expiration Date: 202012302359
Blood Product Expiration Date: 202012302359
Blood Product Expiration Date: 202012302359
ISSUE DATE / TIME: 202012151333
ISSUE DATE / TIME: 202012151500
ISSUE DATE / TIME: 202012151613
ISSUE DATE / TIME: 202012151613
ISSUE DATE / TIME: 202012151613
Unit Type and Rh: 600
Unit Type and Rh: 600
Unit Type and Rh: 600
Unit Type and Rh: 600
Unit Type and Rh: 600
Unit Type and Rh: 600
Unit Type and Rh: 600

## 2019-11-28 LAB — CBC
HCT: 24.8 % — ABNORMAL LOW (ref 39.0–52.0)
Hemoglobin: 7.6 g/dL — ABNORMAL LOW (ref 13.0–17.0)
MCH: 32.6 pg (ref 26.0–34.0)
MCHC: 30.6 g/dL (ref 30.0–36.0)
MCV: 106.4 fL — ABNORMAL HIGH (ref 80.0–100.0)
Platelets: 90 10*3/uL — ABNORMAL LOW (ref 150–400)
RBC: 2.33 MIL/uL — ABNORMAL LOW (ref 4.22–5.81)
RDW: 22.5 % — ABNORMAL HIGH (ref 11.5–15.5)
WBC: 6.6 10*3/uL (ref 4.0–10.5)
nRBC: 0.5 % — ABNORMAL HIGH (ref 0.0–0.2)

## 2019-11-28 LAB — COMPREHENSIVE METABOLIC PANEL
ALT: 58 U/L — ABNORMAL HIGH (ref 0–44)
AST: 118 U/L — ABNORMAL HIGH (ref 15–41)
Albumin: 1.6 g/dL — ABNORMAL LOW (ref 3.5–5.0)
Alkaline Phosphatase: 131 U/L — ABNORMAL HIGH (ref 38–126)
Anion gap: 4 — ABNORMAL LOW (ref 5–15)
BUN: 10 mg/dL (ref 6–20)
CO2: 30 mmol/L (ref 22–32)
Calcium: 7.3 mg/dL — ABNORMAL LOW (ref 8.9–10.3)
Chloride: 103 mmol/L (ref 98–111)
Creatinine, Ser: 0.87 mg/dL (ref 0.61–1.24)
GFR calc Af Amer: >60 mL/min (ref >60)
GFR calc non Af Amer: >60 mL/min (ref >60)
Glucose, Bld: 118 mg/dL — ABNORMAL HIGH (ref 70–99)
Potassium: 3.7 mmol/L (ref 3.5–5.1)
Sodium: 137 mmol/L (ref 135–145)
Total Bilirubin: 4.8 mg/dL — ABNORMAL HIGH (ref 0.3–1.2)
Total Protein: 4.1 g/dL — ABNORMAL LOW (ref 6.5–8.1)

## 2019-11-28 LAB — AMMONIA: Ammonia: 51 umol/L — ABNORMAL HIGH (ref 9–35)

## 2019-11-28 LAB — PROTIME-INR
INR: 1.3 — ABNORMAL HIGH (ref 0.8–1.2)
Prothrombin Time: 16.1 seconds — ABNORMAL HIGH (ref 11.4–15.2)

## 2019-11-28 MED ORDER — SUCRALFATE 1 GM/10ML PO SUSP
1.0000 g | Freq: Two times a day (BID) | ORAL | Status: DC
  Administered 2019-11-28 – 2019-11-29 (×2): 1 g via ORAL
  Filled 2019-11-28 (×2): qty 10

## 2019-11-28 NOTE — Progress Notes (Signed)
Occupational Therapy Evaluation Patient Details Name: Bradley Miles MRN: [id number] DOB: April 05, 1966 Today's Date: 11/28/2019    History of Present Illness Bradley Miles  is a 53 y.o. male, with PMH significant of alcohol abuse, cirrhosis of the liver, COPD portal hypertension presenting to [facility] with complaints of vomiting blood.    Clinical Impression   Pt presents with above diagnosis. PTA pt PLOF independent in all ADLs and IADLs while living at home with family in 2 level home with basement and no AE. Pt currently Mod I for ADLs with increased time and use of RW for safety when and no physical assistance needed. No further OT needed at this time. Thank you for the referral. OT signing off.      Follow Up Recommendations  No OT follow up    Equipment Recommendations  (walker for safety if needed.)    Recommendations for Other Services       Precautions / Restrictions Precautions Precautions: None Restrictions Weight Bearing Restrictions: No      Mobility Bed Mobility Overal bed mobility: Modified Independent             General bed mobility comments: increased due to pt's complaint of weakness, however, no physical assistance required.   Transfers Overall transfer level: Modified independent Equipment used: Rolling walker (2 wheeled)             General transfer comment: RW used for safety. No observation of instability with simulated toilet transfer from bed <> bed    Balance Overall balance assessment: Modified Independent                                         ADL either performed or assessed with clinical judgement   ADL Overall ADL's : Modified independent                                       General ADL Comments: increased time for safety with most ADLs. RW for safety with functional mobility. Pt educated of safe hand placement and sequencing     Vision         Perception     Praxis      Pertinent  Vitals/Pain       Hand Dominance     Extremity/Trunk Assessment Upper Extremity Assessment Upper Extremity Assessment: Overall WFL for tasks assessed   Lower Extremity Assessment Lower Extremity Assessment: Defer to PT evaluation   Cervical / Trunk Assessment Cervical / Trunk Assessment: Normal   Communication Communication Communication: No difficulties   Cognition Arousal/Alertness: Awake/alert Behavior During Therapy: WFL for tasks assessed/performed Overall Cognitive Status: Within Functional Limits for tasks assessed                                     General Comments       Exercises     Shoulder Instructions      Home Living Family/patient expects to be discharged to:: Private residence Living Arrangements: Spouse/significant other Available Help at Discharge: Family;Friend(s) Type of Home: House Home Access: Stairs to enter [employer] of Steps: 2   Home Layout: Two level;Laundry or work area in basement     [employer] Shower/Tub: Occupational psychologist:  Standard     Home Equipment: None          Prior Functioning/Environment Level of Independence: Independent                 OT Problem List: Decreased strength;Impaired balance (sitting and/or standing);Decreased safety awareness;Decreased knowledge of use of DME or AE      OT Treatment/Interventions:      OT Goals(Current goals can be found in the care plan section) Acute Rehab OT Goals Patient Stated Goal: to go home OT Goal Formulation: With patient Time For Goal Achievement: 12/12/19 Potential to Achieve Goals: Fair  OT Frequency:     Barriers to D/C:            Co-evaluation              AM-PAC OT "6 Clicks" Daily Activity     Outcome Measure Help from another person eating meals?: None Help from another person taking care of personal grooming?: None Help from another person toileting, which includes using toliet, bedpan, or  urinal?: None Help from another person bathing (including washing, rinsing, drying)?: None Help from another person to put on and taking off regular upper body clothing?: None Help from another person to put on and taking off regular lower body clothing?: None 6 Click Score: 24   End of Session Equipment Utilized During Treatment: Gait belt;Rolling walker  Activity Tolerance: Patient tolerated treatment well Patient left: in bed;with family/visitor present;with call bell/phone within reach  OT Visit Diagnosis: Other (comment)(regarding GI)                Time: 9449-6759 OT Time Calculation (min): 14 min Charges:  OT General Charges $OT Visit: 1 Visit OT Evaluation $OT Eval Low Complexity: [place], MSOT, OTR/L  Supplemental Rehabilitation Services  [phone]   Bradley Miles 11/28/2019, 3:23 PM

## 2019-11-28 NOTE — Progress Notes (Signed)
Pt ambulates in the hallway using walker tolerates well.

## 2019-11-28 NOTE — Progress Notes (Signed)
PROGRESS NOTE                                                                                                                                                                                                             Patient Demographics:    Bradley Miles, is a 53 y.o. male, DOB - 1966-06-30, BDZ:[id number]  Admit date - 11/19/2019   Admitting Physician Oswald Hillock, MD  Outpatient Primary MD for the patient is Luking, Grace Bushy, MD  LOS - 8   Chief Complaint  Patient presents with  . Hematemesis       Brief Narrative    53 y.o.M with PMH of heavy ETOH abuse, cirrhosis, HTN, and portal HTN who presented to the EDwith coffee ground emesis and was found to have erosive gastritis and new oozing esophageal varices S/p clipping x1. Overnight 12/12, he developed large volume bright red hematemesis and was transferred to the ICU. BP responded to volume resuscitation and given 1 unit PRBC's. He has since had multiple episodes of maroon stool and so was transferred to [facility] ICU for CTA and IR consult. On admission to [facility] had some episodes of hypotension,   On 12/15 - ongoing melena, hematochezia, hypotension.  EGD done: Diminutive grade 1 varices found in lower third of esophagus. Red blood found in cardia and fundus.  Fresh blood and clots noted.  Despite suctioning and lavage, ongoing active bleeding noted, obscuring mucosal visualization. S/p TIPS, with embolization of gastric variceal supply   -Has received 11 units PRBC, 3 units FFP, 1 dose Vitamin K   12/11 Admit to hospitalists at [facility] 12/11 upper endoscopy at [facility] >>severe erosive gastritis, new varices 12/12 Hematemesis, txfr to [facility] ICU then to [facility] 12/13 BRTO 12/15 repeat EGD ,grade 1 varices found in lower third of esophagus.Red blood found in cardia and fundus. Fresh blood           and clots noted. Despite suctioning and lavage, ongoing active bleeding noted, obscuring  mucosal visualization. 12/15 TIPS    Subjective:    [name] today reports he is had 2 bowel movements so far this morning, normal in color, no blood, ambulated in the hallway with walker .   Assessment  & Plan :    Active Problems:   Hematemesis/vomiting blood   Acute blood loss anemia  ETOH abuse   Bleeding esophageal varices (HCC)   GI bleeding due to gastric varices bleeding due to portal hypertension/cirrhosis -s/p BRTO 12/13  -S/p TIPS and gastric embolization 12/15 -GI input greatly appreciated, -Monitor closely for hepatic encephalopathy given recent TIPS procedure, he is currently on lactulose 20 g twice daily, with good bowel movements, ammonia level is 51, but patient is awake alert oriented and appropriate, will continue with current dose of lactulose. -Initially on octreotide drip, stopped yesterday. -Started on nadolol  Liver cirrhosis -Secondary to alcohol abuse -Elevated LFTs secondary to liver cirrhosis -Tinea with lactulose and rifaximin -Check ammonia, INR and CMP in a.m. -Continue with Rocephin for SBP prophylaxis  Hemorrhagic shock/acute blood loss anemia -Secondary to upper GI bleed from gastric varices -Has received 11 units PRBC, 3 units FFP, 1 dose Vitamin K -Monitor hemoglobin closely and transfuse to keep hemoglobin more than 8  Alcohol abuse -Continue with CIWA protocol -Continue thiamine, folate and multivitamins  -He was counseled  Hypomagnesemia/hypocalcemia -Monitor  closely and replete as needed    COVID-19 Labs  No results for input(s): DDIMER, FERRITIN, LDH, CRP in the last 72 hours.  Lab Results  Component Value Date   [place] NEGATIVE 11/19/2019   [place] Not Detected 09/17/2019   [place] Not Detected 07/15/2019     Code Status : Full  Family Communication  : wife at bedside  Disposition Plan  : Home when stable  Consults  :  PCCM, GI, IR  DVT Prophylaxis  :  SCD, MORE AMBULATORY.  Lab Results    Component Value Date   PLT 90 (L) 11/28/2019    Antibiotics  :    Anti-infectives (From admission, onward)   Start     Dose/Rate Route Frequency Ordered Stop   11/25/19 0915  rifaximin (XIFAXAN) tablet 550 mg     550 mg Oral 2 times daily 11/25/19 0909     11/22/19 1236  ceFAZolin (ANCEF) 2-4 GM/100ML-% IVPB    Note to Pharmacy: Margaretmary Dys   : cabinet override      11/22/19 1236 11/22/19 1424   11/22/19 1045  ceFAZolin (ANCEF) 3 g in dextrose 5 % 50 mL IVPB     3 g 100 mL/hr over 30 Minutes Intravenous To Radiology 11/22/19 1004 11/23/19 1045   11/20/19 1130  erythromycin 355 mg in sodium chloride 0.9 % 100 mL IVPB    Note to Pharmacy: To be given 30 minutes prior to EGD.   3 mg/kg  117.9 kg 100 mL/hr over 60 Minutes Intravenous  Once 11/20/19 1106 11/20/19 1732   11/20/19 0815  cefTRIAXone (ROCEPHIN) 2 g in sodium chloride 0.9 % 100 mL IVPB  Status:  Discontinued     2 g 200 mL/hr over 30 Minutes Intravenous Every 24 hours 11/20/19 0750 11/26/19 0904        Objective:   Vitals:   11/27/19 0508 11/27/19 1529 11/27/19 2114 11/28/19 0519  BP: 137/80 (!) 117/57 122/70 117/70  Pulse: (!) 105 (!) 102 85 83  Resp: 17 14 12 16   Temp: 98.2 F (36.8 C) 98.1 F (36.7 C)  98.3 F (36.8 C)  TempSrc: Oral Oral  Oral  SpO2: 97% 90% 95% (!) 89%  Weight:      Height:        Wt Readings from Last 3 Encounters:  11/21/19 119 kg  10/09/19 122 kg  08/19/19 118 kg     Intake/Output Summary (Last 24 hours) at 11/28/2019 1325 Last data filed at  11/28/2019 0847 Gross per 24 hour  Intake 210 ml  Output 925 ml  Net -715 ml     Physical Exam  Awake Alert, Oriented X 3, No new F.N deficits, Normal affect Symmetrical Chest wall movement, Good air movement bilaterally, CTAB RRR,No Gallops,Rubs or new Murmurs, No Parasternal Heave +ve B.Sounds, abdomen is less distended today, No tenderness, No rebound - guarding or rigidity. No Cyanosis, Clubbing , Trace edema, No new Rash or  bruise       Data Review:    CBC Recent Labs  Lab 11/24/19 1022 11/24/19 1120 11/25/19 0343 11/25/19 1200 11/25/19 1750 11/26/19 0347 11/27/19 0410 11/28/19 0344  WBC 13.9* 14.4* 18.3*  --  13.4* 10.3 7.4 6.6  HGB 6.3* 6.3* 8.4* 7.8* 7.7* 7.9* 8.0* 7.6*  HCT 19.0* 18.5* 24.9* 23.0* 23.6* 23.8* 25.3* 24.8*  PLT 113* 117* 105*  --  105* 92* 104* 90*  MCV 97.9 97.4 97.3  --  100.0 99.6 104.1* 106.4*  MCH 32.5 33.2 32.8  --  32.6 33.1 32.9 32.6  MCHC 33.2 34.1 33.7  --  32.6 33.2 31.6 30.6  RDW 22.3* 22.4* 20.5*  --  21.9* 22.1* 22.9* 22.5*  LYMPHSABS 1.3 1.3  --   --   --   --   --   --   MONOABS 2.5* 2.7*  --   --   --   --   --   --   EOSABS 0.3 0.3  --   --   --   --   --   --   BASOSABS 0.1 0.1  --   --   --   --   --   --     Chemistries  Recent Labs  Lab 11/22/19 0712 11/23/19 0829 11/24/19 0554 11/24/19 1717 11/24/19 1940 11/25/19 0343 11/26/19 0347 11/28/19 0344  NA 139 141 140 141 141 142 141 137  K 3.8 4.0 4.2 5.6* 5.5* 5.0 4.0 3.7  CL 108 109 110  --   --  110 108 103  CO2 20* 25 25  --   --  25 29 30   GLUCOSE 119* 121* 156*  --   --  169* 159* 118*  BUN 18 15 15   --   --  20 19 10   CREATININE 0.94 0.90 1.12  --   --  1.15 0.83 0.87  CALCIUM 6.6* 6.5* 6.6*  --   --  6.7* 7.0* 7.3*  MG 0.8*  --   --   --   --  1.4*  --   --   AST 71* 77*  --   --   --  102* 208* 118*  ALT 20 25  --   --   --  33 77* 58*  ALKPHOS 104 85  --   --   --  67 80 131*  BILITOT 5.8* 4.0*  --   --   --  7.5* 5.5* 4.8*   ------------------------------------------------------------------------------------------------------------------ No results for input(s): CHOL, HDL, LDLCALC, TRIG, CHOLHDL, LDLDIRECT in the last 72 hours.  Lab Results  Component Value Date   HGBA1C 5.5 11/21/2019   ------------------------------------------------------------------------------------------------------------------ No results for input(s): TSH, T4TOTAL, T3FREE, THYROIDAB in the last 72  hours.  Invalid input(s): FREET3 ------------------------------------------------------------------------------------------------------------------ No results for input(s): VITAMINB12, FOLATE, FERRITIN, TIBC, IRON, RETICCTPCT in the last 72 hours.  Coagulation profile Recent Labs  Lab 11/22/19 2304 11/25/19 1200 11/28/19 0344  INR 1.8* 1.6* 1.3*    No results for input(s): DDIMER  in the last 72 hours.  Cardiac Enzymes No results for input(s): CKMB, TROPONINI, MYOGLOBIN in the last 168 hours.  Invalid input(s): CK ------------------------------------------------------------------------------------------------------------------    Component Value Date/Time   BNP 63.0 06/01/2018 0831    Inpatient Medications  Scheduled Meds: . Chlorhexidine Gluconate Cloth  6 each Topical Daily  . folic acid  1 mg Oral Daily  . lactulose  20 g Oral BID  . multivitamin with minerals  1 tablet Oral Daily  . nadolol  40 mg Oral Daily  . pantoprazole  40 mg Oral BID  . PARoxetine  20 mg Oral Daily  . rifaximin  550 mg Oral BID  . sucralfate  1 g Oral BID  . thiamine  100 mg Oral Daily   Continuous Infusions:  PRN Meds:.iohexol, metoprolol tartrate, ondansetron **OR** ondansetron (ZOFRAN) IV, sodium chloride flush  Micro Results Recent Results (from the past 240 hour(s))  SARS Coronavirus 2 by RT PCR (hospital order, performed in [facility] hospital lab)     Status: None   Collection Time: 11/19/19  9:33 PM  Result Value Ref Range Status   SARS Coronavirus 2 NEGATIVE NEGATIVE Final    Comment: (NOTE) SARS-CoV-2 target nucleic acids are NOT DETECTED. The SARS-CoV-2 RNA is generally detectable in upper and lower respiratory specimens during the acute phase of infection. The lowest concentration of SARS-CoV-2 viral copies this assay can detect is 250 copies / mL. A negative result does not preclude SARS-CoV-2 infection and should not be used as the sole basis for treatment or  other patient management decisions.  A negative result may occur with improper specimen collection / handling, submission of specimen other than nasopharyngeal swab, presence of viral mutation(s) within the areas targeted by this assay, and inadequate number of viral copies (<250 copies / mL). A negative result must be combined with clinical observations, patient history, and epidemiological information. Fact Sheet for Patients:   StrictlyIdeas.no Fact Sheet for Healthcare Providers: BankingDealers.co.za This test is not yet approved or cleared  by the [place] FDA and has been authorized for detection and/or diagnosis of SARS-CoV-2 by FDA under an Emergency Use Authorization (EUA).  This EUA will remain in effect (meaning this test can be used) for the duration of the COVID-19 declaration under Section 564(b)(1) of the Act, 21 U.S.C. section 360bbb-3(b)(1), unless the authorization is terminated or revoked sooner. Performed at [facility], [place] [address], [place], [place] [address]   MRSA PCR Screening     Status: None   Collection Time: 11/21/19  4:43 AM   Specimen: Nasal Mucosa; Nasopharyngeal  Result Value Ref Range Status   MRSA by PCR NEGATIVE NEGATIVE Final    Comment:        The GeneXpert MRSA Assay (FDA approved for NASAL specimens only), is one component of a comprehensive MRSA colonization surveillance program. It is not intended to diagnose MRSA infection nor to guide or monitor treatment for MRSA infections. Performed at [facility], [address], [place], [place] [address]     Radiology Reports DG Chest 1 View  Result Date: 11/22/2019 CLINICAL DATA:  Central line placement EXAM: CHEST  1 VIEW COMPARISON:  06/01/2018 FINDINGS: Right neck vascular catheter is positioned with tip near the superior cavoatrial junction. Cardiomegaly. Mild, diffuse interstitial pulmonary opacity. IMPRESSION: 1. Right neck  vascular catheter with tip near the superior cavoatrial junction. 2. Cardiomegaly with mild diffuse interstitial pulmonary opacity, which may reflect edema or infection. Electronically Signed   By: Cristie Hem  Laqueta Carina M.D.   On: 11/22/2019 14:59   IR Angiogram Selective Each Additional Vessel  Result Date: 11/25/2019 CLINICAL DATA:  53 year old male with a history of life threatening of bleeding secondary to portal hypertension and gastric varices. The patient is status post balloon occlusion retrograde trans venous obliteration (BRTO) on 11/22/2019, with recurrent hemorrhage confirmed by upper endoscopy EXAM: ULTRASOUND-GUIDED TRANS HEPATIC PORTAL VEIN ACCESS ULTRASOUND GUIDED ACCESS RIGHT JUGULAR VEIN. TIPS PLACEMENT COIL AND PLUG EMBOLIZATION OF MULTIPLE PORTAL VEIN TRIBUTARIES TO A PERSISTING ESOPHAGOGASTRIC VARICEAL NETWORK MEDICATIONS: As antibiotic prophylaxis, 2 G ANCEF was ordered pre-procedure and administered intravenously within one hour of incision. One unit of platelets was also administered IV during the procedure. ANESTHESIA/SEDATION: General - as administered by the Anesthesia department Vascular Interventional Radiology physicians: Dr. Earleen Newport, Dr. Kathlene Cote, Dr. Laurence Ferrari CONTRAST:  56m OMNIPAQUE IOHEXOL 300 MG/ML SOLN, 756mOMNIPAQUE IOHEXOL 300 MG/ML SOLN, 7081mMNIPAQUE IOHEXOL 300 MG/ML SOLN FLUOROSCOPY TIME:  Fluoroscopy Time: 66 minutes 36 seconds (6280 mGy). COMPLICATIONS: None PROCEDURE: Informed written consent was obtained from the patient's family after a thorough discussion of the procedural risks, benefits and alternatives. Specific risks discussed with TIPS/variceal embolization included: Bleeding, infection, vascular injury, need for further procedure/surgery, renal injury/renal failure, contrast reaction, non-target embolization, liver dysfunction/failure, hepatic encephalopathy, stroke (~1%), cardiopulmonary collapse, death. All questions were addressed. Maximal Sterile Barrier  Technique was utilized including caps, mask, sterile gowns, sterile gloves, sterile drape, hand hygiene and skin antiseptic. A timeout was performed prior to the initiation of the procedure. Patient was positioned supine position on the table, with the right upper quadrant and the right neck prepped and draped in the usual sterile fashion. Ultrasound survey of the right upper quadrant was performed, with images stored and sent to PACs. Using ultrasound guidance, Chiba needle was used access the right portal system. Once we confirmed position in the portal system with portal venography, micro wire was advanced into the inferior mesenteric vein. The inner dilator was advanced over the metal stiffener into the portal system, with the inner dilator advanced over the wire. Venogram confirmed are location within the portal system. The micro wire was then replaced into the inner dilator, with the transition point position at the apex of the catheter. Check flow vial was placed on the back and of the inner dilator. Ultrasound survey was then performed of the right neck, with images stored and sent to PACs. Ultrasound guidance was then used to access the right internal jugular vein with a micro puncture kit. The wire was advanced under fluoroscopy into the right atrium, and a small incision was made. The needle was removed and the dilator was placed. The micro wire in the stiffener were removed and an 035 wire was then passed into the inferior vena cava. Ten French soft tissue dilation was performed on the wire, and then 10 [place] sheath was placed. Combination of Benson wire and multipurpose angled catheter were used to select the right hepatic vein. During the selection the right hepatic vein, accessory right hepatic vein was accessed with venography. Venogram confirmed location within the right hepatic vein. Tip sheath was then advanced over the catheter with a coaxial Amplatz wire, for a position cephalad to the  transition point of the wire in the portal venous system. The [place] needle was then advanced through the TIPS sheath housed in the Teflon sheath over the Amplatz wire. Using oblique projections, only a single pass was required to access the portal venous system. Of note, the liver was very  dense, with difficulty penetrating the portal vein wall with the colapinto needle. Access into the portal venous system was confirmed by aspirating portal venous blood and a venogram. A stiff Glidewire was then used to navigate through the portal vein, ultimately with purchase in the IMV. Needle was removed. The stock 5 French glide catheter was attempted to pass over the Glidewire into the portal system. The tip would not penetrate the portal vein wall. A 4 French straight glide cath was then advanced on the Glidewire, and with forward tension, ultimately the glide cath penetrated the portal vein wall, with good purchase into the portal vein. Venogram confirmed location. Amplatz wire was then passed into the splenic vein through the Glidewire. The next step of dilating the soft tissue tract with series of balloons was quite difficult given the density of the liver. Initially a 4 mm by 40 mm balloon was successful passing into the portal vein with dilation of the soft tissue tract. Even after 4 mm balloon angioplasty, the sheath would not advanced over the Amplatz wire and balloon into the portal system, encountering resistance at the wall of the portal vein. 5 mm balloon angioplasty was then performed, with a similar resistance to passing the sheath into the portal vein. We attempted to pass the sheath with the coaxial dilator/introducer which was unsuccessful. 6 mm balloon angioplasty along the length of the tissue tract was ultimately successful, only after several reposition of the Amplatz wire into the distal splenic vein with the use of the glide catheter. Once the tip sheath was in the portal system, we confirmed  location with angiogram. We selected a 10 mm x 2 x 8 viator stent, which was deployed and post dilated to 10 mm. Venogram confirm excellent flow through the TIPS shunt. Pigtail catheter was then advanced into the splenic vein with repeat portal venogram. This demonstrated significant perfusion of the gastroesophageal network via left gastric, posterior gastric, and accessory posterior gastric contribution. We proceeded with embolization. Angled multipurpose catheter was used to select the largest of the posterior gastric veins. Coil embolization then proceeded with fibered 035 interlock coils, using multiple 10 mm diameter, 8 mm diameter, and 6 mm diameter coils. Once the flow was static in the first posterior gastric vein contribution, the catheter was withdrawn and repeat venogram was performed demonstrating accessory posterior gastric vein. This vein was selected with the 035 multipurpose catheter and coil embolized with fibered interlock coils, as well as a Amplatzer 4 plug. Repeat splenic vein/portal vein venogram demonstrated occlusion of enlarged short gastric vein after the sclerotherapy treatment, with persisting left gastric vein contribution to the gastroesophageal network. The multipurpose catheter was then used to select these left gastric vein contributors, which were then coil embolized/plug embolized with a combination of fibrin coils and Amplatzer 4. Final angiogram was performed. The access in the right portal system was removed. Access in the right internal jugular vein was removed with hemostasis achieved. Sterile bandage was placed. Patient tolerated the procedure well and remained hemodynamically stable throughout. No complications were encountered and no significant blood loss. FINDINGS: Ultrasound guided access right internal jugular vein demonstrates wide patency. Creation of the portal systemic shunt demonstrated a significantly dense liver parenchyma, of which even the colapinto needle had  difficulty crossing. Excellent flow through the TIPS shunt after creation, with 10 mm by 2 x 8 stent placed, dilated to 10 mm. Multiple portal contribution identified from the portal and splenic vein including posterior gastric, accessory posterior gastric, left gastric, and  accessory left gastric venous inflow into a persisting gastroesophageal varices network. Each of these was coil embolized/plug embolized to stasis with no persisting flow into the network upon completion. The preferential portal venous contribution that was treated during the balloon occlusion retrograde trans venous ablation on Sunday was a large short gastric vein which remains occluded on today's study. Persisting changes of prior sclerotherapy in the gastric variceal system. IMPRESSION: Status post TIPS creation and treatment of persisting portal contribution to gastroesophageal variceal network by combination of coil and plug embolization to stasis. Signed, Dulcy Fanny. Dellia Nims, RPVI [facility] Radiology Electronically Signed   By: Corrie Mckusick D.O.   On: 11/25/2019 13:21   IR Angiogram Selective Each Additional Vessel  Result Date: 11/25/2019 CLINICAL DATA:  53 year old male with a history of life threatening of bleeding secondary to portal hypertension and gastric varices. The patient is status post balloon occlusion retrograde trans venous obliteration (BRTO) on 11/22/2019, with recurrent hemorrhage confirmed by upper endoscopy EXAM: ULTRASOUND-GUIDED TRANS HEPATIC PORTAL VEIN ACCESS ULTRASOUND GUIDED ACCESS RIGHT JUGULAR VEIN. TIPS PLACEMENT COIL AND PLUG EMBOLIZATION OF MULTIPLE PORTAL VEIN TRIBUTARIES TO A PERSISTING ESOPHAGOGASTRIC VARICEAL NETWORK MEDICATIONS: As antibiotic prophylaxis, 2 G ANCEF was ordered pre-procedure and administered intravenously within one hour of incision. One unit of platelets was also administered IV during the procedure. ANESTHESIA/SEDATION: General -  as administered by the Anesthesia department Vascular Interventional Radiology physicians: Dr. Earleen Newport, Dr. Kathlene Cote, Dr. Laurence Ferrari CONTRAST:  57m OMNIPAQUE IOHEXOL 300 MG/ML SOLN, 769mOMNIPAQUE IOHEXOL 300 MG/ML SOLN, 7070mMNIPAQUE IOHEXOL 300 MG/ML SOLN FLUOROSCOPY TIME:  Fluoroscopy Time: 66 minutes 36 seconds (6280 mGy). COMPLICATIONS: None PROCEDURE: Informed written consent was obtained from the patient's family after a thorough discussion of the procedural risks, benefits and alternatives. Specific risks discussed with TIPS/variceal embolization included: Bleeding, infection, vascular injury, need for further procedure/surgery, renal injury/renal failure, contrast reaction, non-target embolization, liver dysfunction/failure, hepatic encephalopathy, stroke (~1%), cardiopulmonary collapse, death. All questions were addressed. Maximal Sterile Barrier Technique was utilized including caps, mask, sterile gowns, sterile gloves, sterile drape, hand hygiene and skin antiseptic. A timeout was performed prior to the initiation of the procedure. Patient was positioned supine position on the table, with the right upper quadrant and the right neck prepped and draped in the usual sterile fashion. Ultrasound survey of the right upper quadrant was performed, with images stored and sent to PACs. Using ultrasound guidance, Chiba needle was used access the right portal system. Once we confirmed position in the portal system with portal venography, micro wire was advanced into the inferior mesenteric vein. The inner dilator was advanced over the metal stiffener into the portal system, with the inner dilator advanced over the wire. Venogram confirmed are location within the portal system. The micro wire was then replaced into the inner dilator, with the transition point position at the apex of the catheter. Check flow vial was placed on the back and of the inner dilator. Ultrasound survey was then performed of the right neck,  with images stored and sent to PACs. Ultrasound guidance was then used to access the right internal jugular vein with a micro puncture kit. The wire was advanced under fluoroscopy into the right atrium, and a small incision was made. The needle was removed and the dilator was placed. The micro wire in the stiffener were removed and an 035 wire was then passed into the inferior vena cava. Ten French soft tissue dilation was performed on the wire, and then 10  [place] TIPS sheath was placed. Combination of Benson wire and multipurpose angled catheter were used to select the right hepatic vein. During the selection the right hepatic vein, accessory right hepatic vein was accessed with venography. Venogram confirmed location within the right hepatic vein. Tip sheath was then advanced over the catheter with a coaxial Amplatz wire, for a position cephalad to the transition point of the wire in the portal venous system. The Colapinto needle was then advanced through the TIPS sheath housed in the Teflon sheath over the Amplatz wire. Using oblique projections, only a single pass was required to access the portal venous system. Of note, the liver was very dense, with difficulty penetrating the portal vein wall with the colapinto needle. Access into the portal venous system was confirmed by aspirating portal venous blood and a venogram. A stiff Glidewire was then used to navigate through the portal vein, ultimately with purchase in the IMV. Needle was removed. The stock 5 French glide catheter was attempted to pass over the Glidewire into the portal system. The tip would not penetrate the portal vein wall. A 4 French straight glide cath was then advanced on the Glidewire, and with forward tension, ultimately the glide cath penetrated the portal vein wall, with good purchase into the portal vein. Venogram confirmed location. Amplatz wire was then passed into the splenic vein through the Glidewire. The next step of dilating the  soft tissue tract with series of balloons was quite difficult given the density of the liver. Initially a 4 mm by 40 mm balloon was successful passing into the portal vein with dilation of the soft tissue tract. Even after 4 mm balloon angioplasty, the sheath would not advanced over the Amplatz wire and balloon into the portal system, encountering resistance at the wall of the portal vein. 5 mm balloon angioplasty was then performed, with a similar resistance to passing the sheath into the portal vein. We attempted to pass the sheath with the coaxial dilator/introducer which was unsuccessful. 6 mm balloon angioplasty along the length of the tissue tract was ultimately successful, only after several reposition of the Amplatz wire into the distal splenic vein with the use of the glide catheter. Once the tip sheath was in the portal system, we confirmed location with angiogram. We selected a 10 mm x 2 x 8 viator stent, which was deployed and post dilated to 10 mm. Venogram confirm excellent flow through the TIPS shunt. Pigtail catheter was then advanced into the splenic vein with repeat portal venogram. This demonstrated significant perfusion of the gastroesophageal network via left gastric, posterior gastric, and accessory posterior gastric contribution. We proceeded with embolization. Angled multipurpose catheter was used to select the largest of the posterior gastric veins. Coil embolization then proceeded with fibered 035 interlock coils, using multiple 10 mm diameter, 8 mm diameter, and 6 mm diameter coils. Once the flow was static in the first posterior gastric vein contribution, the catheter was withdrawn and repeat venogram was performed demonstrating accessory posterior gastric vein. This vein was selected with the 035 multipurpose catheter and coil embolized with fibered interlock coils, as well as a Amplatzer 4 plug. Repeat splenic vein/portal vein venogram demonstrated occlusion of enlarged short gastric  vein after the sclerotherapy treatment, with persisting left gastric vein contribution to the gastroesophageal network. The multipurpose catheter was then used to select these left gastric vein contributors, which were then coil embolized/plug embolized with a combination of fibrin coils and Amplatzer 4. Final angiogram was performed. The access in  the right portal system was removed. Access in the right internal jugular vein was removed with hemostasis achieved. Sterile bandage was placed. Patient tolerated the procedure well and remained hemodynamically stable throughout. No complications were encountered and no significant blood loss. FINDINGS: Ultrasound guided access right internal jugular vein demonstrates wide patency. Creation of the portal systemic shunt demonstrated a significantly dense liver parenchyma, of which even the colapinto needle had difficulty crossing. Excellent flow through the TIPS shunt after creation, with 10 mm by 2 x 8 stent placed, dilated to 10 mm. Multiple portal contribution identified from the portal and splenic vein including posterior gastric, accessory posterior gastric, left gastric, and accessory left gastric venous inflow into a persisting gastroesophageal varices network. Each of these was coil embolized/plug embolized to stasis with no persisting flow into the network upon completion. The preferential portal venous contribution that was treated during the balloon occlusion retrograde trans venous ablation on Sunday was a large short gastric vein which remains occluded on today's study. Persisting changes of prior sclerotherapy in the gastric variceal system. IMPRESSION: Status post TIPS creation and treatment of persisting portal contribution to gastroesophageal variceal network by combination of coil and plug embolization to stasis. Signed, Dulcy Fanny. Dellia Nims, RPVI Vascular and Interventional Radiology Specialists [facility] Radiology Electronically Signed   By: Corrie Mckusick D.O.   On: 11/25/2019 13:21   IR Angiogram Selective Each Additional Vessel  Result Date: 11/25/2019 CLINICAL DATA:  53 year old male with a history of life threatening of bleeding secondary to portal hypertension and gastric varices. The patient is status post balloon occlusion retrograde trans venous obliteration (BRTO) on 11/22/2019, with recurrent hemorrhage confirmed by upper endoscopy EXAM: ULTRASOUND-GUIDED TRANS HEPATIC PORTAL VEIN ACCESS ULTRASOUND GUIDED ACCESS RIGHT JUGULAR VEIN. TIPS PLACEMENT COIL AND PLUG EMBOLIZATION OF MULTIPLE PORTAL VEIN TRIBUTARIES TO A PERSISTING ESOPHAGOGASTRIC VARICEAL NETWORK MEDICATIONS: As antibiotic prophylaxis, 2 G ANCEF was ordered pre-procedure and administered intravenously within one hour of incision. One unit of platelets was also administered IV during the procedure. ANESTHESIA/SEDATION: General - as administered by the Anesthesia department Vascular Interventional Radiology physicians: Dr. Earleen Newport, Dr. Kathlene Cote, Dr. Laurence Ferrari CONTRAST:  89m OMNIPAQUE IOHEXOL 300 MG/ML SOLN, 78mOMNIPAQUE IOHEXOL 300 MG/ML SOLN, 7068mMNIPAQUE IOHEXOL 300 MG/ML SOLN FLUOROSCOPY TIME:  Fluoroscopy Time: 66 minutes 36 seconds (6280 mGy). COMPLICATIONS: None PROCEDURE: Informed written consent was obtained from the patient's family after a thorough discussion of the procedural risks, benefits and alternatives. Specific risks discussed with TIPS/variceal embolization included: Bleeding, infection, vascular injury, need for further procedure/surgery, renal injury/renal failure, contrast reaction, non-target embolization, liver dysfunction/failure, hepatic encephalopathy, stroke (~1%), cardiopulmonary collapse, death. All questions were addressed. Maximal Sterile Barrier Technique was utilized including caps, mask, sterile gowns, sterile gloves, sterile drape, hand hygiene and skin antiseptic. A timeout was performed prior to the initiation of the procedure. Patient was  positioned supine position on the table, with the right upper quadrant and the right neck prepped and draped in the usual sterile fashion. Ultrasound survey of the right upper quadrant was performed, with images stored and sent to PACs. Using ultrasound guidance, Chiba needle was used access the right portal system. Once we confirmed position in the portal system with portal venography, micro wire was advanced into the inferior mesenteric vein. The inner dilator was advanced over the metal stiffener into the portal system, with the inner dilator advanced over the wire. Venogram confirmed are location within the portal system. The micro wire was then replaced into the inner dilator, with the  transition point position at the apex of the catheter. Check flow vial was placed on the back and of the inner dilator. Ultrasound survey was then performed of the right neck, with images stored and sent to PACs. Ultrasound guidance was then used to access the right internal jugular vein with a micro puncture kit. The wire was advanced under fluoroscopy into the right atrium, and a small incision was made. The needle was removed and the dilator was placed. The micro wire in the stiffener were removed and an 035 wire was then passed into the inferior vena cava. Ten French soft tissue dilation was performed on the wire, and then 10 [place] TIPS sheath was placed. Combination of Benson wire and multipurpose angled catheter were used to select the right hepatic vein. During the selection the right hepatic vein, accessory right hepatic vein was accessed with venography. Venogram confirmed location within the right hepatic vein. Tip sheath was then advanced over the catheter with a coaxial Amplatz wire, for a position cephalad to the transition point of the wire in the portal venous system. The Colapinto needle was then advanced through the TIPS sheath housed in the Teflon sheath over the Amplatz wire. Using oblique projections, only a  single pass was required to access the portal venous system. Of note, the liver was very dense, with difficulty penetrating the portal vein wall with the colapinto needle. Access into the portal venous system was confirmed by aspirating portal venous blood and a venogram. A stiff Glidewire was then used to navigate through the portal vein, ultimately with purchase in the IMV. Needle was removed. The stock 5 French glide catheter was attempted to pass over the Glidewire into the portal system. The tip would not penetrate the portal vein wall. A 4 French straight glide cath was then advanced on the Glidewire, and with forward tension, ultimately the glide cath penetrated the portal vein wall, with good purchase into the portal vein. Venogram confirmed location. Amplatz wire was then passed into the splenic vein through the Glidewire. The next step of dilating the soft tissue tract with series of balloons was quite difficult given the density of the liver. Initially a 4 mm by 40 mm balloon was successful passing into the portal vein with dilation of the soft tissue tract. Even after 4 mm balloon angioplasty, the sheath would not advanced over the Amplatz wire and balloon into the portal system, encountering resistance at the wall of the portal vein. 5 mm balloon angioplasty was then performed, with a similar resistance to passing the sheath into the portal vein. We attempted to pass the sheath with the coaxial dilator/introducer which was unsuccessful. 6 mm balloon angioplasty along the length of the tissue tract was ultimately successful, only after several reposition of the Amplatz wire into the distal splenic vein with the use of the glide catheter. Once the tip sheath was in the portal system, we confirmed location with angiogram. We selected a 10 mm x 2 x 8 viator stent, which was deployed and post dilated to 10 mm. Venogram confirm excellent flow through the TIPS shunt. Pigtail catheter was then advanced into the  splenic vein with repeat portal venogram. This demonstrated significant perfusion of the gastroesophageal network via left gastric, posterior gastric, and accessory posterior gastric contribution. We proceeded with embolization. Angled multipurpose catheter was used to select the largest of the posterior gastric veins. Coil embolization then proceeded with fibered 035 interlock coils, using multiple 10 mm diameter, 8 mm diameter, and 6  mm diameter coils. Once the flow was static in the first posterior gastric vein contribution, the catheter was withdrawn and repeat venogram was performed demonstrating accessory posterior gastric vein. This vein was selected with the 035 multipurpose catheter and coil embolized with fibered interlock coils, as well as a Amplatzer 4 plug. Repeat splenic vein/portal vein venogram demonstrated occlusion of enlarged short gastric vein after the sclerotherapy treatment, with persisting left gastric vein contribution to the gastroesophageal network. The multipurpose catheter was then used to select these left gastric vein contributors, which were then coil embolized/plug embolized with a combination of fibrin coils and Amplatzer 4. Final angiogram was performed. The access in the right portal system was removed. Access in the right internal jugular vein was removed with hemostasis achieved. Sterile bandage was placed. Patient tolerated the procedure well and remained hemodynamically stable throughout. No complications were encountered and no significant blood loss. FINDINGS: Ultrasound guided access right internal jugular vein demonstrates wide patency. Creation of the portal systemic shunt demonstrated a significantly dense liver parenchyma, of which even the colapinto needle had difficulty crossing. Excellent flow through the TIPS shunt after creation, with 10 mm by 2 x 8 stent placed, dilated to 10 mm. Multiple portal contribution identified from the portal and splenic vein including  posterior gastric, accessory posterior gastric, left gastric, and accessory left gastric venous inflow into a persisting gastroesophageal varices network. Each of these was coil embolized/plug embolized to stasis with no persisting flow into the network upon completion. The preferential portal venous contribution that was treated during the balloon occlusion retrograde trans venous ablation on Sunday was a large short gastric vein which remains occluded on today's study. Persisting changes of prior sclerotherapy in the gastric variceal system. IMPRESSION: Status post TIPS creation and treatment of persisting portal contribution to gastroesophageal variceal network by combination of coil and plug embolization to stasis. Signed, Dulcy Fanny. Dellia Nims, RPVI Vascular and Interventional Radiology Specialists [facility] Radiology Electronically Signed   By: Corrie Mckusick D.O.   On: 11/25/2019 13:21   IR Angiogram Selective Each Additional Vessel  Result Date: 11/22/2019 CLINICAL DATA:  53 year old male with alcoholic cirrhosis and upper at GI hemorrhage, transferred from [facility] with gastric variceal hemorrhage, status post endoscopic identification and clipping. He has continued to have GI blood loss with need for pressor support, and presents now for urgent treatment with BRTO and/or TIPS EXAM: ULTRASOUND GUIDED ACCESS RIGHT COMMON FEMORAL VEIN LEFT RENAL VEIN ANGIOGRAM RETROGRADE SCLEROTHERAPY OF GASTRIC VARICES, ASSISTED WITH COIL EMBOLIZATION MEDICATIONS: As antibiotic prophylaxis, 2 G ANCEF was ordered pre-procedure and administered intravenously within one hour of incision. One unit of platelets was also administered IV during the procedure. ANESTHESIA/SEDATION: General - as administered by the Anesthesia department CONTRAST:  90 cc IV contrast FLUOROSCOPY TIME:  Fluoroscopy Time: 19 minutes 42 seconds (12/29/2041 mGy). COMPLICATIONS: None PROCEDURE: Informed written consent was obtained from the  patient and the patient's family after a thorough discussion of the procedural risks, benefits and alternatives. Specific risks that were addressed regarding BRTO include, bleeding, infection, contrast reaction, kidney injury, need for further procedure or surgery including TIPS and/ or endoscopy, pulmonary embolism, stroke, worsening of ascites, worsening of esophageal varices, paradoxical liver failure, varix rupture, cardiopulmonary collapse, death. All questions were addressed. Maximal Sterile Barrier Technique was utilized including caps, mask, sterile gowns, sterile gloves, sterile drape, hand hygiene and skin antiseptic. A timeout was performed prior to the initiation of the procedure. Ultrasound survey of the right inguinal region was performed  with images stored and sent to PACs. A single wall puncture needle was used access the right common femoral artery under ultrasound guidance with saline syringe. With venous blood flow returned, a Bentson wire was observed to enter the common femoral vein and the IVC under fluoroscopy. The needle was removed, and a small incision was made, and 9 [place] dilator was passed over the Bentson wire. A 10 French TIPS sheath was then passed over the Bentson wire after manually creating a significant leftward current on the catheter. The inner dilator and wire were removed, and a 5 [place] cobra catheter was passed over the Bentson wire into the IVC. Cobra catheter was used to select the left renal vein orifice. Combination of the Bentson wire, Glidewire, Rosen wire were used to gain purchased into the renal vein, with renal vein limited venogram performed to identify the in flowing gastro renal shunt. With a safety curve on a stiff Amplatz wire position into superior pole renal vein, the TIPS sheath was advanced into the left renal vein over the Cobra catheter. Pullback reinforced straight sheath selection technique (PRESSS) was performed, and as the tip sheath was withdrawn,  contrast was used to identified the inflow of the gastro renal shunt. With the coaxial Amplatz wire in position, a combination of a standard Kumpe catheter and a 4 French angled glide catheter were used to cannulate the orifice of the gastro renal shunt flowing shunt opposite the adrenal vein inflow. Once the Glidewire was advanced into the shunt, the catheter was advanced, and the Glidewire was exchanged for a stiff Amplatz wire with a safety curve on the tip. The renal vein buddy wire was withdrawn, and the TIPS sheath was advanced into the gastro renal shunt. As the 10 [place] TIPS sheath was clearly not long enough at 35 cm, a 45 cm 12 French sheath was placed. This sheath was then advanced into the most inferior aspects of the shunt orifice into the renal vein, though would not advance further given the length. Once the glide catheter and the Glidewire were in a reasonable location within the shunt, the catheter was withdrawn and then a balloon occlusion catheter was advanced over the Amplatz wire into the gastro renal shunt. Amplatz wire was then withdrawn. Angiogram was then performed to confirmed location within the gastro renal shunt outflow. Initial balloon inflation did not achieve stasis of the shunt, and the balloon was deflated with repositioning needed. There was difficulty passing the [place] balloon catheter into a more distal position of the shunt, likely secondary to tortuosity and/or venous web. Exchange for an 8 [place] Merci balloon, 95 cm, was performed on an Amplatz wire. A more distal position within the shunt was achieved. Balloon on the [facility] was inflated on the balloon occlusion catheter and was withdrawn to the first encountered web to achieve hemostasis. Angiogram confirmed stasis of flow. A CO2 angiogram was also performed, identifying the posterior gastric veins as the stopping point. A penumbra Lantern microcatheter and a fathom wire were navigated into the shunt. Once we  confirmed location within the outflow with standard angiogram, treatment was initiated. A foamed solution of detergent/sclerosant was mixed in a standard Tessari method, with 1:2:3 ratio of ethiodized oil: sodium tetradecyl sulfate: Room air. The sclerosed sent was infused under fluoroscopic guidance, with observation of reflux into the afferent veins. Once reflux was identified into the posterior gastric vein, sclerosing was completed. This required only approximately 15 cc of foam sclerotherapy in the above ratio. Once the  posterior gastric veins were identified, we stop treatment. The micro catheter was withdrawn to the tip of the balloon occlusion catheter, and coil mass was deposited with deployment of multiple Ruby coils. This was performed with 24 by 60 cm coil, 28 x 60 cm coil, and 3 by 32 x 60 cm coil. The balloon was deflated under fluoroscopy, with no motion identified. The Merci balloon was slightly withdrawn into the more distal shunt (closer to the renal vein) and angiogram was performed confirming complete stasis of flow beyond the coil mass. Once we were confident there was no migration, balloon was withdrawn and the sheath was withdrawn. Hemostasis was achieved with manual compression. Patient remained hemodynamically stable throughout. No complications were encountered and no significant blood loss was encountered. IMPRESSION: Status post ultrasound guided access right common femoral vein for coil-assisted BRTO (sclerotherapy) as treatment for life-threatening gastric variceal hemorrhage. Signed, Dulcy Fanny. Dellia Nims, [place] Vascular and Interventional Radiology Specialists [facility] Radiology Plan PLAN: Observe in PACU, then returned ICU Continue current management including serial H and H Local wound care management at the common femoral vein access site Repeat CT angiogram abdomen/pelvis after 24 hours-48 hours on this admission Follow-up vascular Interventional Radiology Clinic in 4-8 weeks  Electronically Signed   By: Corrie Mckusick D.O.   On: 11/22/2019 14:32   IR Venogram Renal Uni Left  Result Date: 11/22/2019 CLINICAL DATA:  53 year old male with alcoholic cirrhosis and upper at GI hemorrhage, transferred from [facility] with gastric variceal hemorrhage, status post endoscopic identification and clipping. He has continued to have GI blood loss with need for pressor support, and presents now for urgent treatment with BRTO and/or TIPS EXAM: ULTRASOUND GUIDED ACCESS RIGHT COMMON FEMORAL VEIN LEFT RENAL VEIN ANGIOGRAM RETROGRADE SCLEROTHERAPY OF GASTRIC VARICES, ASSISTED WITH COIL EMBOLIZATION MEDICATIONS: As antibiotic prophylaxis, 2 G ANCEF was ordered pre-procedure and administered intravenously within one hour of incision. One unit of platelets was also administered IV during the procedure. ANESTHESIA/SEDATION: General - as administered by the Anesthesia department CONTRAST:  90 cc IV contrast FLUOROSCOPY TIME:  Fluoroscopy Time: 19 minutes 42 seconds (12/29/2041 mGy). COMPLICATIONS: None PROCEDURE: Informed written consent was obtained from the patient and the patient's family after a thorough discussion of the procedural risks, benefits and alternatives. Specific risks that were addressed regarding BRTO include, bleeding, infection, contrast reaction, kidney injury, need for further procedure or surgery including TIPS and/ or endoscopy, pulmonary embolism, stroke, worsening of ascites, worsening of esophageal varices, paradoxical liver failure, varix rupture, cardiopulmonary collapse, death. All questions were addressed. Maximal Sterile Barrier Technique was utilized including caps, mask, sterile gowns, sterile gloves, sterile drape, hand hygiene and skin antiseptic. A timeout was performed prior to the initiation of the procedure. Ultrasound survey of the right inguinal region was performed with images stored and sent to PACs. A single wall puncture needle was used access the right  common femoral artery under ultrasound guidance with saline syringe. With venous blood flow returned, a Bentson wire was observed to enter the common femoral vein and the IVC under fluoroscopy. The needle was removed, and a small incision was made, and 9 [place] dilator was passed over the Bentson wire. A 10 French TIPS sheath was then passed over the Bentson wire after manually creating a significant leftward current on the catheter. The inner dilator and wire were removed, and a 5 [place] cobra catheter was passed over the Bentson wire into the IVC. Cobra catheter was used to select the left renal vein orifice.  Combination of the Bentson wire, Glidewire, Rosen wire were used to gain purchased into the renal vein, with renal vein limited venogram performed to identify the in flowing gastro renal shunt. With a safety curve on a stiff Amplatz wire position into superior pole renal vein, the TIPS sheath was advanced into the left renal vein over the Cobra catheter. Pullback reinforced straight sheath selection technique (PRESSS) was performed, and as the tip sheath was withdrawn, contrast was used to identified the inflow of the gastro renal shunt. With the coaxial Amplatz wire in position, a combination of a standard Kumpe catheter and a 4 French angled glide catheter were used to cannulate the orifice of the gastro renal shunt flowing shunt opposite the adrenal vein inflow. Once the Glidewire was advanced into the shunt, the catheter was advanced, and the Glidewire was exchanged for a stiff Amplatz wire with a safety curve on the tip. The renal vein buddy wire was withdrawn, and the TIPS sheath was advanced into the gastro renal shunt. As the 10 [place] TIPS sheath was clearly not long enough at 35 cm, a 45 cm 12 French sheath was placed. This sheath was then advanced into the most inferior aspects of the shunt orifice into the renal vein, though would not advance further given the length. Once the glide catheter and  the Glidewire were in a reasonable location within the shunt, the catheter was withdrawn and then a balloon occlusion catheter was advanced over the Amplatz wire into the gastro renal shunt. Amplatz wire was then withdrawn. Angiogram was then performed to confirmed location within the gastro renal shunt outflow. Initial balloon inflation did not achieve stasis of the shunt, and the balloon was deflated with repositioning needed. There was difficulty passing the Crawford balloon catheter into a more distal position of the shunt, likely secondary to tortuosity and/or venous web. Exchange for an 8 [place] Merci balloon, 95 cm, was performed on an Amplatz wire. A more distal position within the shunt was achieved. Balloon on the [facility] was inflated on the balloon occlusion catheter and was withdrawn to the first encountered web to achieve hemostasis. Angiogram confirmed stasis of flow. A CO2 angiogram was also performed, identifying the posterior gastric veins as the stopping point. A penumbra Lantern microcatheter and a fathom wire were navigated into the shunt. Once we confirmed location within the outflow with standard angiogram, treatment was initiated. A foamed solution of detergent/sclerosant was mixed in a standard Tessari method, with 1:2:3 ratio of ethiodized oil: sodium tetradecyl sulfate: Room air. The sclerosed sent was infused under fluoroscopic guidance, with observation of reflux into the afferent veins. Once reflux was identified into the posterior gastric vein, sclerosing was completed. This required only approximately 15 cc of foam sclerotherapy in the above ratio. Once the posterior gastric veins were identified, we stop treatment. The micro catheter was withdrawn to the tip of the balloon occlusion catheter, and coil mass was deposited with deployment of multiple Ruby coils. This was performed with 24 by 60 cm coil, 28 x 60 cm coil, and 3 by 32 x 60 cm coil. The balloon was deflated under  fluoroscopy, with no motion identified. The Merci balloon was slightly withdrawn into the more distal shunt (closer to the renal vein) and angiogram was performed confirming complete stasis of flow beyond the coil mass. Once we were confident there was no migration, balloon was withdrawn and the sheath was withdrawn. Hemostasis was achieved with manual compression. Patient remained hemodynamically stable throughout. No complications  were encountered and no significant blood loss was encountered. IMPRESSION: Status post ultrasound guided access right common femoral vein for coil-assisted BRTO (sclerotherapy) as treatment for life-threatening gastric variceal hemorrhage. Signed, Dulcy Fanny. Dellia Nims, [employer] [facility] Special[facility] Rad[facility] Plan PLAN: Observe in PACU, then returned ICU Continue current management including serial H and H Local wound care management at the common femoral vein access site Repeat CT angiogram abdomen/pelvis after 24 hours-48 hours on this admission Follow-up vascular Interventional Radiology Clinic in 4-8 weeks Electronically Signed   By: Corrie Mckusick D.O.   On: 11/22/2019 14:32   IR Tips  Result Date: 11/25/2019 CLINICAL DATA:  53 year old male with a history of life threatening of bleeding secondary to portal hypertension and gastric varices. The patient is status post balloon occlusion retrograde trans venous obliteration (BRTO) on 11/22/2019, with recurrent hemorrhage confirmed by upper endoscopy EXAM: ULTRASOUND-GUIDED TRANS HEPATIC PORTAL VEIN ACCESS ULTRASOUND GUIDED ACCESS RIGHT JUGULAR VEIN. TIPS PLACEMENT COIL AND PLUG EMBOLIZATION OF MULTIPLE PORTAL VEIN TRIBUTARIES TO A PERSISTING ESOPHAGOGASTRIC VARICEAL NETWORK MEDICATIONS: As antibiotic prophylaxis, 2 G ANCEF was ordered pre-procedure and administered intravenously within one hour of incision. One unit of platelets was also administered IV during the procedure. ANESTHESIA/SEDATION:  General - as administered by the Anesthesia department Vascular Interventional Radiology physicians: Dr. Earleen Newport, Dr. Kathlene Cote, Dr. Laurence Ferrari CONTRAST:  27m OMNIPAQUE IOHEXOL 300 MG/ML SOLN, 741mOMNIPAQUE IOHEXOL 300 MG/ML SOLN, 7075mMNIPAQUE IOHEXOL 300 MG/ML SOLN FLUOROSCOPY TIME:  Fluoroscopy Time: 66 minutes 36 seconds (6280 mGy). COMPLICATIONS: None PROCEDURE: Informed written consent was obtained from the patient's family after a thorough discussion of the procedural risks, benefits and alternatives. Specific risks discussed with TIPS/variceal embolization included: Bleeding, infection, vascular injury, need for further procedure/surgery, renal injury/renal failure, contrast reaction, non-target embolization, liver dysfunction/failure, hepatic encephalopathy, stroke (~1%), cardiopulmonary collapse, death. All questions were addressed. Maximal Sterile Barrier Technique was utilized including caps, mask, sterile gowns, sterile gloves, sterile drape, hand hygiene and skin antiseptic. A timeout was performed prior to the initiation of the procedure. Patient was positioned supine position on the table, with the right upper quadrant and the right neck prepped and draped in the usual sterile fashion. Ultrasound survey of the right upper quadrant was performed, with images stored and sent to PACs. Using ultrasound guidance, Chiba needle was used access the right portal system. Once we confirmed position in the portal system with portal venography, micro wire was advanced into the inferior mesenteric vein. The inner dilator was advanced over the metal stiffener into the portal system, with the inner dilator advanced over the wire. Venogram confirmed are location within the portal system. The micro wire was then replaced into the inner dilator, with the transition point position at the apex of the catheter. Check flow vial was placed on the back and of the inner dilator. Ultrasound survey was then performed of the  right neck, with images stored and sent to PACs. Ultrasound guidance was then used to access the right internal jugular vein with a micro puncture kit. The wire was advanced under fluoroscopy into the right atrium, and a small incision was made. The needle was removed and the dilator was placed. The micro wire in the stiffener were removed and an 035 wire was then passed into the inferior vena cava. Ten French soft tissue dilation was performed on the wire, and then 10 [place] sheath was placed. Combination of Benson wire and multipurpose angled catheter were used to select the right hepatic vein.  During the selection the right hepatic vein, accessory right hepatic vein was accessed with venography. Venogram confirmed location within the right hepatic vein. Tip sheath was then advanced over the catheter with a coaxial Amplatz wire, for a position cephalad to the transition point of the wire in the portal venous system. The Colapinto needle was then advanced through the TIPS sheath housed in the Teflon sheath over the Amplatz wire. Using oblique projections, only a single pass was required to access the portal venous system. Of note, the liver was very dense, with difficulty penetrating the portal vein wall with the colapinto needle. Access into the portal venous system was confirmed by aspirating portal venous blood and a venogram. A stiff Glidewire was then used to navigate through the portal vein, ultimately with purchase in the IMV. Needle was removed. The stock 5 French glide catheter was attempted to pass over the Glidewire into the portal system. The tip would not penetrate the portal vein wall. A 4 French straight glide cath was then advanced on the Glidewire, and with forward tension, ultimately the glide cath penetrated the portal vein wall, with good purchase into the portal vein. Venogram confirmed location. Amplatz wire was then passed into the splenic vein through the Glidewire. The next step of  dilating the soft tissue tract with series of balloons was quite difficult given the density of the liver. Initially a 4 mm by 40 mm balloon was successful passing into the portal vein with dilation of the soft tissue tract. Even after 4 mm balloon angioplasty, the sheath would not advanced over the Amplatz wire and balloon into the portal system, encountering resistance at the wall of the portal vein. 5 mm balloon angioplasty was then performed, with a similar resistance to passing the sheath into the portal vein. We attempted to pass the sheath with the coaxial dilator/introducer which was unsuccessful. 6 mm balloon angioplasty along the length of the tissue tract was ultimately successful, only after several reposition of the Amplatz wire into the distal splenic vein with the use of the glide catheter. Once the tip sheath was in the portal system, we confirmed location with angiogram. We selected a 10 mm x 2 x 8 viator stent, which was deployed and post dilated to 10 mm. Venogram confirm excellent flow through the TIPS shunt. Pigtail catheter was then advanced into the splenic vein with repeat portal venogram. This demonstrated significant perfusion of the gastroesophageal network via left gastric, posterior gastric, and accessory posterior gastric contribution. We proceeded with embolization. Angled multipurpose catheter was used to select the largest of the posterior gastric veins. Coil embolization then proceeded with fibered 035 interlock coils, using multiple 10 mm diameter, 8 mm diameter, and 6 mm diameter coils. Once the flow was static in the first posterior gastric vein contribution, the catheter was withdrawn and repeat venogram was performed demonstrating accessory posterior gastric vein. This vein was selected with the 035 multipurpose catheter and coil embolized with fibered interlock coils, as well as a Amplatzer 4 plug. Repeat splenic vein/portal vein venogram demonstrated occlusion of enlarged  short gastric vein after the sclerotherapy treatment, with persisting left gastric vein contribution to the gastroesophageal network. The multipurpose catheter was then used to select these left gastric vein contributors, which were then coil embolized/plug embolized with a combination of fibrin coils and Amplatzer 4. Final angiogram was performed. The access in the right portal system was removed. Access in the right internal jugular vein was removed with hemostasis achieved. Sterile bandage was  placed. Patient tolerated the procedure well and remained hemodynamically stable throughout. No complications were encountered and no significant blood loss. FINDINGS: Ultrasound guided access right internal jugular vein demonstrates wide patency. Creation of the portal systemic shunt demonstrated a significantly dense liver parenchyma, of which even the colapinto needle had difficulty crossing. Excellent flow through the TIPS shunt after creation, with 10 mm by 2 x 8 stent placed, dilated to 10 mm. Multiple portal contribution identified from the portal and splenic vein including posterior gastric, accessory posterior gastric, left gastric, and accessory left gastric venous inflow into a persisting gastroesophageal varices network. Each of these was coil embolized/plug embolized to stasis with no persisting flow into the network upon completion. The preferential portal venous contribution that was treated during the balloon occlusion retrograde trans venous ablation on Sunday was a large short gastric vein which remains occluded on today's study. Persisting changes of prior sclerotherapy in the gastric variceal system. IMPRESSION: Status post TIPS creation and treatment of persisting portal contribution to gastroesophageal variceal network by combination of coil and plug embolization to stasis. Signed, Dulcy Fanny. Dellia Nims, [facility] Vascular and Interventional Radiology Specialists [facility] Electronically Signed    By: Corrie Mckusick D.O.   On: 11/25/2019 13:21   IR US Guide Vasc Access Right  Result Date: 11/22/2019 CLINICAL DATA:  53 year old male with alcoholic cirrhosis and upper at GI hemorrhage, transferred from [facility] with gastric variceal hemorrhage, status post endoscopic identification and clipping. He has continued to have GI blood loss with need for pressor support, and presents now for urgent treatment with BRTO and/or TIPS EXAM: ULTRASOUND GUIDED ACCESS RIGHT COMMON FEMORAL VEIN LEFT RENAL VEIN ANGIOGRAM RETROGRADE SCLEROTHERAPY OF GASTRIC VARICES, ASSISTED WITH COIL EMBOLIZATION MEDICATIONS: As antibiotic prophylaxis, 2 G ANCEF was ordered pre-procedure and administered intravenously within one hour of incision. One unit of platelets was also administered IV during the procedure. ANESTHESIA/SEDATION: General - as administered by the Anesthesia department CONTRAST:  90 cc IV contrast FLUOROSCOPY TIME:  Fluoroscopy Time: 19 minutes 42 seconds (12/29/2041 mGy). COMPLICATIONS: None PROCEDURE: Informed written consent was obtained from the patient and the patient's family after a thorough discussion of the procedural risks, benefits and alternatives. Specific risks that were addressed regarding BRTO include, bleeding, infection, contrast reaction, kidney injury, need for further procedure or surgery including TIPS and/ or endoscopy, pulmonary embolism, stroke, worsening of ascites, worsening of esophageal varices, paradoxical liver failure, varix rupture, cardiopulmonary collapse, death. All questions were addressed. Maximal Sterile Barrier Technique was utilized including caps, mask, sterile gowns, sterile gloves, sterile drape, hand hygiene and skin antiseptic. A timeout was performed prior to the initiation of the procedure. Ultrasound survey of the right inguinal region was performed with images stored and sent to PACs. A single wall puncture needle was used access the right common femoral artery  under ultrasound guidance with saline syringe. With venous blood flow returned, a Bentson wire was observed to enter the common femoral vein and the IVC under fluoroscopy. The needle was removed, and a small incision was made, and 9 [place] dilator was passed over the Bentson wire. A 10 French TIPS sheath was then passed over the Bentson wire after manually creating a significant leftward current on the catheter. The inner dilator and wire were removed, and a 5 [place] cobra catheter was passed over the Bentson wire into the IVC. Cobra catheter was used to select the left renal vein orifice. Combination of the Bentson wire, Glidewire, Rosen wire were used to gain  purchased into the renal vein, with renal vein limited venogram performed to identify the in flowing gastro renal shunt. With a safety curve on a stiff Amplatz wire position into superior pole renal vein, the TIPS sheath was advanced into the left renal vein over the Cobra catheter. Pullback reinforced straight sheath selection technique (PRESSS) was performed, and as the tip sheath was withdrawn, contrast was used to identified the inflow of the gastro renal shunt. With the coaxial Amplatz wire in position, a combination of a standard Kumpe catheter and a 4 French angled glide catheter were used to cannulate the orifice of the gastro renal shunt flowing shunt opposite the adrenal vein inflow. Once the Glidewire was advanced into the shunt, the catheter was advanced, and the Glidewire was exchanged for a stiff Amplatz wire with a safety curve on the tip. The renal vein buddy wire was withdrawn, and the TIPS sheath was advanced into the gastro renal shunt. As the 10 [place] TIPS sheath was clearly not long enough at 35 cm, a 45 cm 12 French sheath was placed. This sheath was then advanced into the most inferior aspects of the shunt orifice into the renal vein, though would not advance further given the length. Once the glide catheter and the Glidewire were in a  reasonable location within the shunt, the catheter was withdrawn and then a balloon occlusion catheter was advanced over the Amplatz wire into the gastro renal shunt. Amplatz wire was then withdrawn. Angiogram was then performed to confirmed location within the gastro renal shunt outflow. Initial balloon inflation did not achieve stasis of the shunt, and the balloon was deflated with repositioning needed. There was difficulty passing the [place] balloon catheter into a more distal position of the shunt, likely secondary to tortuosity and/or venous web. Exchange for an 8 [place] Merci balloon, 95 cm, was performed on an Amplatz wire. A more distal position within the shunt was achieved. Balloon on the [facility] was inflated on the balloon occlusion catheter and was withdrawn to the first encountered web to achieve hemostasis. Angiogram confirmed stasis of flow. A CO2 angiogram was also performed, identifying the posterior gastric veins as the stopping point. A penumbra Lantern microcatheter and a fathom wire were navigated into the shunt. Once we confirmed location within the outflow with standard angiogram, treatment was initiated. A foamed solution of detergent/sclerosant was mixed in a standard Tessari method, with 1:2:3 ratio of ethiodized oil: sodium tetradecyl sulfate: Room air. The sclerosed sent was infused under fluoroscopic guidance, with observation of reflux into the afferent veins. Once reflux was identified into the posterior gastric vein, sclerosing was completed. This required only approximately 15 cc of foam sclerotherapy in the above ratio. Once the posterior gastric veins were identified, we stop treatment. The micro catheter was withdrawn to the tip of the balloon occlusion catheter, and coil mass was deposited with deployment of multiple Ruby coils. This was performed with 24 by 60 cm coil, 28 x 60 cm coil, and 3 by 32 x 60 cm coil. The balloon was deflated under fluoroscopy, with no motion  identified. The Merci balloon was slightly withdrawn into the more distal shunt (closer to the renal vein) and angiogram was performed confirming complete stasis of flow beyond the coil mass. Once we were confident there was no migration, balloon was withdrawn and the sheath was withdrawn. Hemostasis was achieved with manual compression. Patient remained hemodynamically stable throughout. No complications were encountered and no significant blood loss was encountered. IMPRESSION: Status post  ultrasound guided access right common femoral vein for coil-assisted BRTO (sclerotherapy) as treatment for life-threatening gastric variceal hemorrhage. Signed, Dulcy Fanny. Dellia Nims, [place] [facility] and Interventional Radiology Specialists [place] Radiology Plan PLAN: Observe in PACU, then returned ICU Continue current management including serial H and H Local wound care management at the common femoral vein access site Repeat CT angiogram abdomen/pelvis after 24 hours-48 hours on this admission Follow-up vascular Interventional Radiology Clinic in 4-8 weeks Electronically Signed   By: Corrie Mckusick D.O.   On: 11/22/2019 14:32   DG Chest Port 1 View  Result Date: 11/28/2019 CLINICAL DATA:  Hypoxia. EXAM: PORTABLE CHEST 1 VIEW COMPARISON:  Chest x-ray dated November 24, 2019. FINDINGS: Interval removal of the right internal jugular central venous catheter. The heart size and mediastinal contours are within normal limits. Normal pulmonary vascularity. Unchanged elevation of the right hemidiaphragm with mild right basilar atelectasis and small right pleural effusion. Resolved left pleural effusion and basilar atelectasis. No pneumothorax. No acute osseous abnormality. IMPRESSION: 1. Unchanged small right pleural effusion and right basilar atelectasis. 2. Resolved left pleural effusion and basilar atelectasis. Electronically Signed   By: Titus Dubin M.D.   On: 11/28/2019 09:13   DG Chest Port 1 View  Result Date:  11/24/2019 CLINICAL DATA:  Abnormal breath sounds EXAM: PORTABLE CHEST 1 VIEW COMPARISON:  Chest x-ray dated 11/22/2011 FINDINGS: The right-sided central venous catheter is stable in positioning. The heart size is enlarged. There are new small bilateral pleural effusions. There are new bibasilar airspace opacities favored to represent postoperative atelectasis. IMPRESSION: 1. New small bilateral pleural effusions and bibasilar airspace opacities favored to represent postoperative atelectasis. 2. Stable right central venous catheter. Electronically Signed   By: Constance Holster M.D.   On: 11/24/2019 21:39   IR EMBO ART  VEN HEMORR LYMPH EXTRAV  INC GUIDE ROADMAPPING  Result Date: 11/25/2019 CLINICAL DATA:  53 year old male with a history of life threatening of bleeding secondary to portal hypertension and gastric varices. The patient is status post balloon occlusion retrograde trans venous obliteration (BRTO) on 11/22/2019, with recurrent hemorrhage confirmed by upper endoscopy EXAM: ULTRASOUND-GUIDED TRANS HEPATIC PORTAL VEIN ACCESS ULTRASOUND GUIDED ACCESS RIGHT JUGULAR VEIN. TIPS PLACEMENT COIL AND PLUG EMBOLIZATION OF MULTIPLE PORTAL VEIN TRIBUTARIES TO A PERSISTING ESOPHAGOGASTRIC VARICEAL NETWORK MEDICATIONS: As antibiotic prophylaxis, 2 G ANCEF was ordered pre-procedure and administered intravenously within one hour of incision. One unit of platelets was also administered IV during the procedure. ANESTHESIA/SEDATION: General - as administered by the Anesthesia department Vascular Interventional Radiology physicians: Dr. Earleen Newport, Dr. Kathlene Cote, Dr. Laurence Ferrari CONTRAST:  54m OMNIPAQUE IOHEXOL 300 MG/ML SOLN, 751mOMNIPAQUE IOHEXOL 300 MG/ML SOLN, 7020mMNIPAQUE IOHEXOL 300 MG/ML SOLN FLUOROSCOPY TIME:  Fluoroscopy Time: 66 minutes 36 seconds (6280 mGy). COMPLICATIONS: None PROCEDURE: Informed written consent was obtained from the patient's family after a thorough discussion of the procedural risks,  benefits and alternatives. Specific risks discussed with TIPS/variceal embolization included: Bleeding, infection, vascular injury, need for further procedure/surgery, renal injury/renal failure, contrast reaction, non-target embolization, liver dysfunction/failure, hepatic encephalopathy, stroke (~1%), cardiopulmonary collapse, death. All questions were addressed. Maximal Sterile Barrier Technique was utilized including caps, mask, sterile gowns, sterile gloves, sterile drape, hand hygiene and skin antiseptic. A timeout was performed prior to the initiation of the procedure. Patient was positioned supine position on the table, with the right upper quadrant and the right neck prepped and draped in the usual sterile fashion. Ultrasound survey of the right upper quadrant was performed, with images stored and sent  to PACs. Using ultrasound guidance, Chiba needle was used access the right portal system. Once we confirmed position in the portal system with portal venography, micro wire was advanced into the inferior mesenteric vein. The inner dilator was advanced over the metal stiffener into the portal system, with the inner dilator advanced over the wire. Venogram confirmed are location within the portal system. The micro wire was then replaced into the inner dilator, with the transition point position at the apex of the catheter. Check flow vial was placed on the back and of the inner dilator. Ultrasound survey was then performed of the right neck, with images stored and sent to PACs. Ultrasound guidance was then used to access the right internal jugular vein with a micro puncture kit. The wire was advanced under fluoroscopy into the right atrium, and a small incision was made. The needle was removed and the dilator was placed. The micro wire in the stiffener were removed and an 035 wire was then passed into the inferior vena cava. Ten French soft tissue dilation was performed on the wire, and then 10 [place] TIPS  sheath was placed. Combination of Benson wire and multipurpose angled catheter were used to select the right hepatic vein. During the selection the right hepatic vein, accessory right hepatic vein was accessed with venography. Venogram confirmed location within the right hepatic vein. Tip sheath was then advanced over the catheter with a coaxial Amplatz wire, for a position cephalad to the transition point of the wire in the portal venous system. The Colapinto needle was then advanced through the TIPS sheath housed in the Teflon sheath over the Amplatz wire. Using oblique projections, only a single pass was required to access the portal venous system. Of note, the liver was very dense, with difficulty penetrating the portal vein wall with the colapinto needle. Access into the portal venous system was confirmed by aspirating portal venous blood and a venogram. A stiff Glidewire was then used to navigate through the portal vein, ultimately with purchase in the IMV. Needle was removed. The stock 5 French glide catheter was attempted to pass over the Glidewire into the portal system. The tip would not penetrate the portal vein wall. A 4 French straight glide cath was then advanced on the Glidewire, and with forward tension, ultimately the glide cath penetrated the portal vein wall, with good purchase into the portal vein. Venogram confirmed location. Amplatz wire was then passed into the splenic vein through the Glidewire. The next step of dilating the soft tissue tract with series of balloons was quite difficult given the density of the liver. Initially a 4 mm by 40 mm balloon was successful passing into the portal vein with dilation of the soft tissue tract. Even after 4 mm balloon angioplasty, the sheath would not advanced over the Amplatz wire and balloon into the portal system, encountering resistance at the wall of the portal vein. 5 mm balloon angioplasty was then performed, with a similar resistance to passing  the sheath into the portal vein. We attempted to pass the sheath with the coaxial dilator/introducer which was unsuccessful. 6 mm balloon angioplasty along the length of the tissue tract was ultimately successful, only after several reposition of the Amplatz wire into the distal splenic vein with the use of the glide catheter. Once the tip sheath was in the portal system, we confirmed location with angiogram. We selected a 10 mm x 2 x 8 viator stent, which was deployed and post dilated to 10 mm. Venogram  confirm excellent flow through the TIPS shunt. Pigtail catheter was then advanced into the splenic vein with repeat portal venogram. This demonstrated significant perfusion of the gastroesophageal network via left gastric, posterior gastric, and accessory posterior gastric contribution. We proceeded with embolization. Angled multipurpose catheter was used to select the largest of the posterior gastric veins. Coil embolization then proceeded with fibered 035 interlock coils, using multiple 10 mm diameter, 8 mm diameter, and 6 mm diameter coils. Once the flow was static in the first posterior gastric vein contribution, the catheter was withdrawn and repeat venogram was performed demonstrating accessory posterior gastric vein. This vein was selected with the 035 multipurpose catheter and coil embolized with fibered interlock coils, as well as a Amplatzer 4 plug. Repeat splenic vein/portal vein venogram demonstrated occlusion of enlarged short gastric vein after the sclerotherapy treatment, with persisting left gastric vein contribution to the gastroesophageal network. The multipurpose catheter was then used to select these left gastric vein contributors, which were then coil embolized/plug embolized with a combination of fibrin coils and Amplatzer 4. Final angiogram was performed. The access in the right portal system was removed. Access in the right internal jugular vein was removed with hemostasis achieved. Sterile  bandage was placed. Patient tolerated the procedure well and remained hemodynamically stable throughout. No complications were encountered and no significant blood loss. FINDINGS: Ultrasound guided access right internal jugular vein demonstrates wide patency. Creation of the portal systemic shunt demonstrated a significantly dense liver parenchyma, of which even the colapinto needle had difficulty crossing. Excellent flow through the TIPS shunt after creation, with 10 mm by 2 x 8 stent placed, dilated to 10 mm. Multiple portal contribution identified from the portal and splenic vein including posterior gastric, accessory posterior gastric, left gastric, and accessory left gastric venous inflow into a persisting gastroesophageal varices network. Each of these was coil embolized/plug embolized to stasis with no persisting flow into the network upon completion. The preferential portal venous contribution that was treated during the balloon occlusion retrograde trans venous ablation on Sunday was a large short gastric vein which remains occluded on today's study. Persisting changes of prior sclerotherapy in the gastric variceal system. IMPRESSION: Status post TIPS creation and treatment of persisting portal contribution to gastroesophageal variceal network by combination of coil and plug embolization to stasis. Signed, Dulcy Fanny. Dellia Nims, RPVI [facility] Electronically Signed   By: Corrie Mckusick D.O.   On: 11/25/2019 13:21   IR EMBO ART  VEN HEMORR LYMPH EXTRAV  INC GUIDE ROADMAPPING  Result Date: 11/22/2019 CLINICAL DATA:  53 year old male with alcoholic cirrhosis and upper at GI hemorrhage, transferred from [facility] with gastric variceal hemorrhage, status post endoscopic identification and clipping. He has continued to have GI blood loss with need for pressor support, and presents now for urgent treatment with BRTO and/or TIPS EXAM:  ULTRASOUND GUIDED ACCESS RIGHT COMMON FEMORAL VEIN LEFT RENAL VEIN ANGIOGRAM RETROGRADE SCLEROTHERAPY OF GASTRIC VARICES, ASSISTED WITH COIL EMBOLIZATION MEDICATIONS: As antibiotic prophylaxis, 2 G ANCEF was ordered pre-procedure and administered intravenously within one hour of incision. One unit of platelets was also administered IV during the procedure. ANESTHESIA/SEDATION: General - as administered by the Anesthesia department CONTRAST:  90 cc IV contrast FLUOROSCOPY TIME:  Fluoroscopy Time: 19 minutes 42 seconds (12/29/2041 mGy). COMPLICATIONS: None PROCEDURE: Informed written consent was obtained from the patient and the patient's family after a thorough discussion of the procedural risks, benefits and alternatives. Specific risks that were addressed regarding  BRTO include, bleeding, infection, contrast reaction, kidney injury, need for further procedure or surgery including TIPS and/ or endoscopy, pulmonary embolism, stroke, worsening of ascites, worsening of esophageal varices, paradoxical liver failure, varix rupture, cardiopulmonary collapse, death. All questions were addressed. Maximal Sterile Barrier Technique was utilized including caps, mask, sterile gowns, sterile gloves, sterile drape, hand hygiene and skin antiseptic. A timeout was performed prior to the initiation of the procedure. Ultrasound survey of the right inguinal region was performed with images stored and sent to PACs. A single wall puncture needle was used access the right common femoral artery under ultrasound guidance with saline syringe. With venous blood flow returned, a Bentson wire was observed to enter the common femoral vein and the IVC under fluoroscopy. The needle was removed, and a small incision was made, and 9 [place] dilator was passed over the Bentson wire. A 10 French TIPS sheath was then passed over the Bentson wire after manually creating a significant leftward current on the catheter. The inner dilator and wire were  removed, and a 5 [place] cobra catheter was passed over the Bentson wire into the IVC. Cobra catheter was used to select the left renal vein orifice. Combination of the Bentson wire, Glidewire, Rosen wire were used to gain purchased into the renal vein, with renal vein limited venogram performed to identify the in flowing gastro renal shunt. With a safety curve on a stiff Amplatz wire position into superior pole renal vein, the TIPS sheath was advanced into the left renal vein over the Cobra catheter. Pullback reinforced straight sheath selection technique (PRESSS) was performed, and as the tip sheath was withdrawn, contrast was used to identified the inflow of the gastro renal shunt. With the coaxial Amplatz wire in position, a combination of a standard Kumpe catheter and a 4 French angled glide catheter were used to cannulate the orifice of the gastro renal shunt flowing shunt opposite the adrenal vein inflow. Once the Glidewire was advanced into the shunt, the catheter was advanced, and the Glidewire was exchanged for a stiff Amplatz wire with a safety curve on the tip. The renal vein buddy wire was withdrawn, and the TIPS sheath was advanced into the gastro renal shunt. As the 10 [place] TIPS sheath was clearly not long enough at 35 cm, a 45 cm 12 French sheath was placed. This sheath was then advanced into the most inferior aspects of the shunt orifice into the renal vein, though would not advance further given the length. Once the glide catheter and the Glidewire were in a reasonable location within the shunt, the catheter was withdrawn and then a balloon occlusion catheter was advanced over the Amplatz wire into the gastro renal shunt. Amplatz wire was then withdrawn. Angiogram was then performed to confirmed location within the gastro renal shunt outflow. Initial balloon inflation did not achieve stasis of the shunt, and the balloon was deflated with repositioning needed. There was difficulty passing the  [place] balloon catheter into a more distal position of the shunt, likely secondary to tortuosity and/or venous web. Exchange for an 8 [place] Merci balloon, 95 cm, was performed on an Amplatz wire. A more distal position within the shunt was achieved. Balloon on the [facility] was inflated on the balloon occlusion catheter and was withdrawn to the first encountered web to achieve hemostasis. Angiogram confirmed stasis of flow. A CO2 angiogram was also performed, identifying the posterior gastric veins as the stopping point. A penumbra Lantern microcatheter and a fathom wire were navigated into  the shunt. Once we confirmed location within the outflow with standard angiogram, treatment was initiated. A foamed solution of detergent/sclerosant was mixed in a standard Tessari method, with 1:2:3 ratio of ethiodized oil: sodium tetradecyl sulfate: Room air. The sclerosed sent was infused under fluoroscopic guidance, with observation of reflux into the afferent veins. Once reflux was identified into the posterior gastric vein, sclerosing was completed. This required only approximately 15 cc of foam sclerotherapy in the above ratio. Once the posterior gastric veins were identified, we stop treatment. The micro catheter was withdrawn to the tip of the balloon occlusion catheter, and coil mass was deposited with deployment of multiple Ruby coils. This was performed with 24 by 60 cm coil, 28 x 60 cm coil, and 3 by 32 x 60 cm coil. The balloon was deflated under fluoroscopy, with no motion identified. The Merci balloon was slightly withdrawn into the more distal shunt (closer to the renal vein) and angiogram was performed confirming complete stasis of flow beyond the coil mass. Once we were confident there was no migration, balloon was withdrawn and the sheath was withdrawn. Hemostasis was achieved with manual compression. Patient remained hemodynamically stable throughout. No complications were encountered and no  significant blood loss was encountered. IMPRESSION: Status post ultrasound guided access right common femoral vein for coil-assisted BRTO (sclerotherapy) as treatment for life-threatening gastric variceal hemorrhage. Signed, Dulcy Fanny. Dellia Nims, [place] [facility] Rad[facility] Plan PLAN: Observe in PACU, then returned ICU Continue current management including serial H and H Local wound care management at the common femoral vein access site Repeat CT angiogram abdomen/pelvis after 24 hours-48 hours on this admission Follow-up vascular Interventional Radiology Clinic in 4-8 weeks Electronically Signed   By: Corrie Mckusick D.O.   On: 11/22/2019 14:32   CT Angio Abd/Pel w/ and/or w/o  Result Date: 11/24/2019 CLINICAL DATA:  Status post BRTO, GI bleeding EXAM: CTA ABDOMEN AND PELVIS WITH CONTRAST TECHNIQUE: Multidetector CT imaging of the abdomen and pelvis was performed using the standard protocol during bolus administration of intravenous contrast. Multiplanar reconstructed images and MIPs were obtained and reviewed to evaluate the vascular anatomy. CONTRAST:  122m OMNIPAQUE IOHEXOL 350 MG/ML SOLN COMPARISON:  11/22/2019 FINDINGS: VASCULAR Aorta: Aorta atherosclerotic without significant aneurysm, dissection or occlusive process. No retroperitoneal hemorrhage or hematoma. Celiac: Main celiac origin is widely patent including its splenic and right hepatic branches. Separate origin of the left gastric and left hepatic artery also appearing patent. SMA: SMA origin is patent. Renals: Main renal arteries are widely patent. IMA: Small caliber IMA remains patent off the aorta. Inflow: Iliac atherosclerosis noted without significant inflow disease or occlusion. Proximal Outflow: Atherosclerotic changes of the proximal outflow. The right common femoral and proximal profunda femoral artery are both patent. Right proximal SFA occlusion noted. Left common femoral, profunda femoral,  proximal SFA visualized are all patent. Veins: Dedicated venous imaging not performed. Interval balloon retrograde trans obliteration of the gastric varices. Gastric varices appear largely occluded with radiopaque sclerotherapy and coils noted. Scattered areas non target radiopaque sclerotherapy within small branches of the short gastric veins about the spleen in the left upper quadrant. Overall limited assessment because venous phase imaging was not performed. Review of the MIP images confirms the above findings. NON-VASCULAR Lower chest: Increased dependent basilar atelectasis and very small left effusion. Normal heart size. No pericardial effusion. Native coronary atherosclerosis. Hepatobiliary: Hepatic cirrhosis again evident with heterogeneous attenuation and micro nodularity to the surface. Limited assessment because of arterial phase  imaging only. Small left hepatic dome subcentimeter probable cyst noted. Increased perihepatic ascites. Moderate gallbladder distension with layering gallstones again evident. No gross biliary obstruction or dilatation. Common bile duct nondilated. Pancreas: Unremarkable. No pancreatic ductal dilatation or surrounding inflammatory changes. Spleen: Stable mild splenomegaly.  Increased perisplenic ascites. Adrenals/Urinary Tract: Normal adrenal glands. No renal obstruction or hydronephrosis. No hydroureter or ureteral calculus. Bladder unremarkable. Stomach/Bowel: Negative for bowel obstruction, significant dilatation ileus or free air. Portions of the appendix are demonstrated and appear unremarkable. Increased stranding mesenteric edema. Exam of the bowel is limited but there is slight diffuse colonic bowel wall thickening suggesting mild colitis. New small amount abdominopelvic ascites. Lymphatic: No bulky adenopathy. Reproductive: No acute or significant finding by CT. Other: No inguinal or abdominal wall hernia. Increased mild diffuse lower body wall pelvic subcutaneous edema  compatible with anasarca. Musculoskeletal: No acute osseous finding. Bilateral L5 pars defects noted. No anterolisthesis associated. Acute compression fracture. IMPRESSION: VASCULAR No evidence of significant CTA arterial GI bleeding. 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 Small amount of scattered non target radiopaque sclerotherapy within the perisplenic short gastric veins noted. Aortoiliac atherosclerosis without aneurysm or occlusive process Mesenteric and renal vasculature appear patent centrally. Chronic appearing proximal right SFA occlusion NON-VASCULAR Minor basilar atelectasis and trace left effusion Hepatic cirrhosis with developing mesenteric edema, new small amount of abdominopelvic ascites, and body anasarca. Cholelithiasis Nonspecific mild colonic wall thickening. Difficult to exclude associated pancolitis. Electronically Signed   By: Jerilynn Mages.  Shick M.D.   On: 11/24/2019 13:09   CT Angio Abd/Pel w/ and/or w/o  Result Date: 11/22/2019 CLINICAL DATA:  GI bleed with large bloody bowel movement EXAM: CTA ABDOMEN AND PELVIS WITHOUT AND WITH CONTRAST TECHNIQUE: Multidetector CT imaging of the abdomen and pelvis was performed using the standard protocol during bolus administration of intravenous contrast. Multiplanar reconstructed images and MIPs were obtained and reviewed to evaluate the vascular anatomy. CONTRAST:  100 mL Omnipaque 350. COMPARISON:  06/02/2018 FINDINGS: VASCULAR Aorta: Abdominal aorta is well visualize without aneurysmal dilatation or focal dissection. Minimal atherosclerotic changes are noted. Celiac: Celiac axis is widely patent. The main celiac axis supplies the splenic artery as well as the right hepatic artery. Variant anatomy is noted with a smaller branch arising directly from the aorta just above the celiac axis supplying the left hepatic artery as well as left gastric artery. SMA: Patent without evidence of aneurysm, dissection, vasculitis  or significant stenosis. Renals: Both renal arteries are patent without evidence of aneurysm, dissection, vasculitis, fibromuscular dysplasia or significant stenosis. IMA: Patent without evidence of aneurysm, dissection, vasculitis or significant stenosis. Iliacs: Atherosclerotic changes are noted without focal stenosis Veins: IVC appears within normal limits. Multiple gastric varices are identified near the gastroesophageal junction and along the posterior aspect of the gastric fundus. The largest of these varices measures at least 1.8 cm in greatest dimension. No significant ascending esophageal varices are noted. The gastric varices seen drain into the left renal vein. No other venous abnormality is noted. Portal vein is widely patent. Small coronary vein is seen. Review of the MIP images confirms the above findings. NON-VASCULAR Lower chest: No acute abnormality. Hepatobiliary: Liver is well visualized. Mild heterogeneity of the liver is seen without focal mass. Very mild nodularity is noted. The gallbladder is well distended with dependent gallstones. No complicating factors are seen. Mild perihepatic fluid is noted new from prior exam. Pancreas: Pancreas appears within normal limits. Spleen: Spleen shows no focal mass lesion. Splenic venous  branches communicate with the gastric varices as well. Adrenals/Urinary Tract: The adrenal glands are within normal limits. Kidneys are well visualized bilaterally. No renal calculi or obstructive changes are seen. Delayed images demonstrate a normal enhancement pattern as well. The bladder is partially distended. Stomach/Bowel: Colon is well visualized. Scattered diverticular change is seen without evidence of diverticulitis. The appendix appears within normal limits. Small bowel is unremarkable. No area to suggest active hemorrhage in the small bowel or colon is seen. Stomach is well distended with water administered right before the exam. Some swirling of air and density  is noted within the dependent portion of the stomach. This could be related to thrombus given the known gastric varices. Endoscopic clip is noted adjacent to region of varices posteriorly. Lymphatic: No significant lymphadenopathy is noted. Reproductive: Prostate is unremarkable. Other: No abdominal wall hernia or abnormality. Minimal perihepatic fluid is noted as previously mentioned. Musculoskeletal: Bilateral pars defects are noted at L5 with only minimal anterolisthesis. No acute bony abnormality is noted. IMPRESSION: VASCULAR Variant arterial anatomy of the celiac axis as described above. Changes consistent with gastric varices intercommunicating between the splenic vein and left renal vein. The draining vein into the left renal vein measures approximately for 1.4 x 1.0 cm in diameter. Aortic Atherosclerosis (ICD10-I70.0). NON-VASCULAR Mild heterogeneity of the liver is noted with nodularity consistent with underlying cirrhosis. Diverticulosis without evidence of diverticulitis. Soft tissue density in the dependent portion of the stomach with air within. Given its proximity to the gastric varices along the posterior aspect of the gastric fundus this may represent thrombus. This area lies in an area of previous endoscopic clipping. No areas of active extravasation in the large or small bowel are noted. Electronically Signed   By: Inez Catalina M.D.   On: 11/22/2019 00:35     Phillips Climes M.D on 11/28/2019 at 1:25 PM  Between 7am to 7pm - Pager - [phone]  After 7pm go to [email or web address] - password [facility]  [facility] -  Office  [phone]

## 2019-11-28 NOTE — Evaluation (Signed)
Physical Therapy Evaluation Patient Details Name: Bradley Miles MRN: [id number] DOB: 07/16/1966 Today's Date: 11/28/2019   History of Present Illness  53 y.o. M with PMH of heavy ETOH abuse, cirrhosis, HTN, and portal HTN who presented to the ED with coffee ground emesis and was found to have erosive gastritis and new esophageal varices. On 12/15 had ongoing melena, hematochezia, hypotension. EGD showing dimunitve grade 1 varices found in lower third of esophagus. S/p TIPS with embolization of gastric variceal supply.  Clinical Impression  Pt admitted with above. On PT evaluation, pt presents with mild dynamic balance impairments and decreased gait speed. Ambulating hallway distances with no assistive device without physical difficulty. Denies lightheadedness/dizziness. Education provided re: work modification and activity recommendations. Encouraged continued ambulation with pt wife 3x/day. Pt has no further questions/concerns. No further acute PT needs. PT signing off.     Follow Up Recommendations No PT follow up    Equipment Recommendations  None recommended by PT    Recommendations for Other Services       Precautions / Restrictions Precautions Precautions: None Restrictions Weight Bearing Restrictions: No      Mobility  Bed Mobility Overal bed mobility: Modified Independent             General bed mobility comments: increased due to pt's complaint of weakness, however, no physical assistance required.   Transfers Overall transfer level: Independent Equipment used: None             General transfer comment: RW used for safety. No observation of instability with simulated toilet transfer from bed <> bed  Ambulation/Gait Ambulation/Gait assistance: Supervision Gait Distance (Feet): 350 Feet Assistive device: None Gait Pattern/deviations: Step-through pattern;Decreased stride length;Wide base of support Gait velocity: decreased   General Gait Details:  Mildly increased lateral sway, no evidence of gross imbalance. Slower speed  Stairs            Wheelchair Mobility    Modified Rankin (Stroke Patients Only)       Balance Overall balance assessment: Mild deficits observed, not formally tested                                           Pertinent Vitals/Pain Pain Assessment: No/denies pain    Home Living Family/patient expects to be discharged to:: Private residence Living Arrangements: Spouse/significant other Available Help at Discharge: Family;Friend(s) Type of Home: House Home Access: Stairs to enter   [employer] of Steps: 2 Home Layout: Two level;Laundry or work area in [employer]: None      Prior Function Level of Independence: Independent         Comments: works Public house manager," 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     Hand Dominance        Extremity/Trunk Assessment   Upper Extremity Assessment Upper Extremity Assessment: Overall WFL for tasks assessed    Lower Extremity Assessment Lower Extremity Assessment: Overall WFL for tasks assessed    Cervical / Trunk Assessment Cervical / Trunk Assessment: Normal  Communication   Communication: No difficulties  Cognition Arousal/Alertness: Awake/alert Behavior During Therapy: WFL for tasks assessed/performed Overall Cognitive Status: Within Functional Limits for tasks assessed  General Comments      Exercises     Assessment/Plan    PT Assessment Patent does not need any further PT services  PT Problem List         PT Treatment Interventions      PT Goals (Current goals can be found in the Care Plan section)  Acute Rehab PT Goals Patient Stated Goal: to go home PT Goal Formulation: All assessment and education complete, DC therapy    Frequency     Barriers to discharge        Co-evaluation               AM-PAC PT  "6 Clicks" Mobility  Outcome Measure Help needed turning from your back to your side while in a flat bed without using bedrails?: None Help needed moving from lying on your back to sitting on the side of a flat bed without using bedrails?: None Help needed moving to and from a bed to a chair (including a wheelchair)?: None Help needed standing up from a chair using your arms (e.g., wheelchair or bedside chair)?: None Help needed to walk in hospital room?: None Help needed climbing 3-5 steps with a railing? : A Little 6 Click Score: 23    End of Session   Activity Tolerance: Patient tolerated treatment well Patient left: in bed;with call bell/phone within reach;with family/visitor present Nurse Communication: Mobility status PT Visit Diagnosis: Unsteadiness on feet (R26.81);Muscle weakness (generalized) (M62.81)    Time: 3709-6438 PT Time Calculation (min) (ACUTE ONLY): 17 min   Charges:   PT Evaluation $PT Eval Moderate Complexity: 1 Mod          Ellamae Sia, [place], DPT Acute Rehabilitation Services Pager [phone] Office [phone]   Willy Eddy 11/28/2019, 5:01 PM

## 2019-11-29 DIAGNOSIS — K7031 Alcoholic cirrhosis of liver with ascites: Principal | ICD-10-CM

## 2019-11-29 LAB — COMPREHENSIVE METABOLIC PANEL
ALT: 56 U/L — ABNORMAL HIGH (ref 0–44)
AST: 110 U/L — ABNORMAL HIGH (ref 15–41)
Albumin: 1.6 g/dL — ABNORMAL LOW (ref 3.5–5.0)
Alkaline Phosphatase: 157 U/L — ABNORMAL HIGH (ref 38–126)
Anion gap: 5 (ref 5–15)
BUN: 8 mg/dL (ref 6–20)
CO2: 29 mmol/L (ref 22–32)
Calcium: 7.4 mg/dL — ABNORMAL LOW (ref 8.9–10.3)
Chloride: 104 mmol/L (ref 98–111)
Creatinine, Ser: 0.75 mg/dL (ref 0.61–1.24)
GFR calc Af Amer: >60 mL/min (ref >60)
GFR calc non Af Amer: >60 mL/min (ref >60)
Glucose, Bld: 116 mg/dL — ABNORMAL HIGH (ref 70–99)
Potassium: 3.4 mmol/L — ABNORMAL LOW (ref 3.5–5.1)
Sodium: 138 mmol/L (ref 135–145)
Total Bilirubin: 4.7 mg/dL — ABNORMAL HIGH (ref 0.3–1.2)
Total Protein: 4.3 g/dL — ABNORMAL LOW (ref 6.5–8.1)

## 2019-11-29 LAB — CBC
HCT: 25 % — ABNORMAL LOW (ref 39.0–52.0)
Hemoglobin: 7.7 g/dL — ABNORMAL LOW (ref 13.0–17.0)
MCH: 32.4 pg (ref 26.0–34.0)
MCHC: 30.8 g/dL (ref 30.0–36.0)
MCV: 105 fL — ABNORMAL HIGH (ref 80.0–100.0)
Platelets: 95 10*3/uL — ABNORMAL LOW (ref 150–400)
RBC: 2.38 MIL/uL — ABNORMAL LOW (ref 4.22–5.81)
RDW: 21.5 % — ABNORMAL HIGH (ref 11.5–15.5)
WBC: 5.8 10*3/uL (ref 4.0–10.5)
nRBC: 0 % (ref 0.0–0.2)

## 2019-11-29 MED ORDER — FERROUS SULFATE 325 (65 FE) MG PO TBEC: 325.0000 mg | DELAYED_RELEASE_TABLET | Freq: Two times a day (BID) | ORAL | 3 refills | Status: DC

## 2019-11-29 MED ORDER — ADULT MULTIVITAMIN W/MINERALS CH: 1.0000 | ORAL_TABLET | Freq: Every day | ORAL | Status: DC

## 2019-11-29 MED ORDER — NADOLOL 40 MG PO TABS: 40.0000 mg | ORAL_TABLET | Freq: Every day | ORAL | 0 refills | Status: DC

## 2019-11-29 MED ORDER — LORAZEPAM 1 MG PO TABS: 1.0000 mg | ORAL_TABLET | ORAL | Status: DC | PRN

## 2019-11-29 MED ORDER — LACTULOSE ENCEPHALOPATHY 10 GM/15ML PO SOLN: ORAL | 1 refills | Status: DC

## 2019-11-29 MED ORDER — THIAMINE HCL 100 MG PO TABS: 100.0000 mg | ORAL_TABLET | Freq: Every day | ORAL | Status: DC

## 2019-11-29 MED ORDER — ALLOPURINOL 100 MG PO TABS
100.0000 mg | ORAL_TABLET | Freq: Every day | ORAL | Status: DC
  Administered 2019-11-29: 100 mg via ORAL
  Filled 2019-11-29: qty 1

## 2019-11-29 MED ORDER — LORAZEPAM 2 MG/ML IJ SOLN: 1.0000 mg | INTRAMUSCULAR | Status: DC | PRN

## 2019-11-29 MED ORDER — RIFAXIMIN 550 MG PO TABS: 550.0000 mg | ORAL_TABLET | Freq: Two times a day (BID) | ORAL | 0 refills | Status: DC

## 2019-11-29 NOTE — Discharge Instructions (Signed)
Follow with Primary MD [name], MD in 7 days   Get CBC, CMP, by Primary MD next visit.    Activity: As tolerated with Full fall precautions use walker/cane & assistance as needed   Disposition Home    Diet: Low salt ,heart healthy , with feeding assistance and aspiration precautions.  For Heart failure patients - Check your Weight same time everyday, if you gain over 2 pounds, or you develop in leg swelling, experience more shortness of breath or chest pain, call your Primary MD immediately. Follow Cardiac Low Salt Diet and 1.5 lit/day fluid restriction.   On your next visit with your primary care physician please Get Medicines reviewed and adjusted.   Please request your Prim.MD to go over all Hospital Tests and Procedure/Radiological results at the follow up, please get all Hospital records sent to your Prim MD by signing hospital release before you go home.   If you experience worsening of your admission symptoms, develop shortness of breath, life threatening emergency, suicidal or homicidal thoughts you must seek medical attention immediately by calling 911 or calling your MD immediately  if symptoms less severe.  You Must read complete instructions/literature along with all the possible adverse reactions/side effects for all the Medicines you take and that have been prescribed to you. Take any new Medicines after you have completely understood and accpet all the possible adverse reactions/side effects.   Do not drive, operating heavy machinery, perform activities at heights, swimming or participation in water activities or provide baby sitting services if your were admitted for syncope or siezures until you have seen by Primary MD or a Neurologist and advised to do so again.  Do not drive when taking Pain medications.    Do not take more than prescribed Pain, Sleep and Anxiety Medications  Special Instructions: If you have smoked or chewed Tobacco  in the last 2 yrs  please stop smoking, stop any regular Alcohol  and or any Recreational drug use.  Wear Seat belts while driving.   Please note  You were cared for by a hospitalist during your hospital stay. If you have any questions about your discharge medications or the care you received while you were in the hospital after you are discharged, you can call the unit and asked to speak with the hospitalist on call if the hospitalist that took care of you is not available. Once you are discharged, your primary care physician will handle any further medical issues. Please note that NO REFILLS for any discharge medications will be authorized once you are discharged, as it is imperative that you return to your primary care physician (or establish a relationship with a primary care physician if you do not have one) for your aftercare needs so that they can reassess your need for medications and monitor your lab values.

## 2019-11-29 NOTE — Progress Notes (Signed)
  Outpatient follow up order in place.  Our office staff will contact patient with appointment date and time.  [place] for discharge.  No restrictions. Ok to return to work.  Nanami Whitelaw S Malillany Kazlauskas PA-C 11/29/2019 10:30 AM

## 2019-11-29 NOTE — Discharge Summary (Signed)
Bradley Miles, is a 53 y.o. male  DOB 25-Feb-1966  MRN [id number].  Admission date:  11/19/2019  Admitting Physician  Oswald Hillock, MD  Discharge Date:  11/29/2019   Primary MD  [name], MD  Recommendations for primary care physician for things to follow:  -Please check CBC, CMP during next visit -Continue counseling about alcohol abstinence -To follow with GI as an outpatient   Admission Diagnosis  ETOH abuse [F10.10] Hematemesis/vomiting blood [K92.0] Hematemesis with nausea [K92.0]   Discharge Diagnosis  ETOH abuse [F10.10] Hematemesis/vomiting blood [K92.0] Hematemesis with nausea [K92.0]    Active Problems:   Hematemesis/vomiting blood   Acute blood loss anemia   ETOH abuse   Bleeding esophageal varices ([place])      Past Medical History:  Diagnosis Date  . Anxiety   . Enlarged liver   . GERD (gastroesophageal reflux disease)   . Hypertension   . Palpitations   . PVC's (premature ventricular contractions)   . Shortness of breath   . Varicose veins     Past Surgical History:  Procedure Laterality Date  . BIOPSY  02/02/2019   Procedure: BIOPSY;  Surgeon: Daneil Dolin, MD;  [facility];  Service: Endoscopy;;  gastric  . COLONOSCOPY WITH ESOPHAGOGASTRODUODENOSCOPY (EGD)  12/16/2012   internal hemorrhoids, colonic diverticulosis, benign polyps, screening in 2024. EGD with mild erosive reflux esophagitis, small hiatal hernia, negative H.pylori  . cyst reomved     from spine  . ESOPHAGOGASTRODUODENOSCOPY  05/19/09   RMR: Geographic distal esophageal erosions consistent with severe erosive reflux esophagitis. schatzki's ring s/P dilation/small hiatal hernia otherwise normal stomach  . ESOPHAGOGASTRODUODENOSCOPY (EGD) WITH PROPOFOL N/A 02/02/2019   Dr. Gala Romney: retained gastric contents. portal HTN gastropathy  . ESOPHAGOGASTRODUODENOSCOPY (EGD) WITH PROPOFOL N/A  11/20/2019   Procedure: ESOPHAGOGASTRODUODENOSCOPY (EGD) WITH PROPOFOL;  Surgeon: Daneil Dolin, MD;  [facility];  Service: Endoscopy;  Laterality: N/A;  . ESOPHAGOGASTRODUODENOSCOPY (EGD) WITH PROPOFOL N/A 11/24/2019   Procedure: ESOPHAGOGASTRODUODENOSCOPY (EGD) WITH PROPOFOL;  Surgeon: Ronnette Juniper, MD;  Location: [place];  Service: Gastroenterology;  Laterality: N/A;  . IR ANGIOGRAM SELECTIVE EACH ADDITIONAL VESSEL  11/22/2019  . IR ANGIOGRAM SELECTIVE EACH ADDITIONAL VESSEL  11/24/2019  . IR ANGIOGRAM SELECTIVE EACH ADDITIONAL VESSEL  11/24/2019  . IR ANGIOGRAM SELECTIVE EACH ADDITIONAL VESSEL  11/24/2019  . IR EMBO ART  VEN HEMORR LYMPH EXTRAV  INC GUIDE ROADMAPPING  11/22/2019  . IR EMBO ART  VEN HEMORR LYMPH EXTRAV  INC GUIDE ROADMAPPING  11/24/2019  . IR TIPS  11/24/2019  . IR US GUIDE VASC ACCESS RIGHT  11/22/2019  . IR VENOGRAM RENAL UNI LEFT  11/22/2019  . RADIOLOGY WITH ANESTHESIA N/A 11/24/2019   Procedure: IR WITH ANESTHESIA;  Surgeon: Radiologist, Medication, MD;  Location: [place];  Service: Radiology;  Laterality: N/A;  . TIPS PROCEDURE N/A 11/22/2019   Procedure: BRTO/TRANS-JUGULAR INTRAHEPATIC PORTAL SHUNT (TIPS);  Surgeon: Corrie Mckusick, DO;  Location: [place];  Service: Anesthesiology;  Laterality: N/A;  .  WISDOM TOOTH EXTRACTION         History of present illness and  Hospital Course:     Kindly see H&P for history of present illness and admission details, please review complete Labs, Consult reports and Test reports for all details in brief  HPI  from the history and physical done on the day of admission  11/19/2019  Bradley Miles  is a 53 y.o. male, with history of alcohol abuse, cirrhosis of the liver, COPD portal hypertension came to ED with complaints of vomiting coffee-ground emesis since yesterday morning.  Patient said that he had 15 episodes of vomiting.  He has been off his medications because he is noncompliant as per patient's wife.  He  drinks 1/fifth alcohol daily.  His last drink was around 9 PM last night.  He denies taking NSAIDs. Denies chest pain Complains of some shortness of breath. Denies fever or chills. Denies abdominal pain or dysuria. No blood in the stool. He had EGD done by Dr. Sydell Axon in February 2020 which showed portal gastropathy.  In the ED, gastric occult blood was positive.  Hemoglobin has been stable at 14.8.  Hemodynamically patient is stable.  GI was consulted, Dr. Gala Romney to see patient in a.m.  Started on octreotide infusion, Protonix.  Hospital Course    53 y.o.M with PMH of heavy ETOH abuse, cirrhosis, HTN, and portal HTN who presented to the EDwith coffee ground emesis and was found to have erosive gastritis and new oozing esophageal varices S/p clipping x1. Overnight 12/12, he developed large volume bright red hematemesis and was transferred to the ICU. BP responded to volume resuscitation and given 1 unit PRBC's. He has since had multiple episodes of maroon stool and so was transferred to [facility] ICU for CTA and IR consult. [facility] of [facility],   On 12/15 - ongoing melena, hematochezia, hypotension. EGD done: Diminutive grade 1 varices found in lower third of esophagus. Red blood found in cardia and fundus.  Fresh blood and clots noted.  Despite suctioning and lavage, ongoing active bleeding noted, obscuring mucosal visualization. S/p TIPS, with embolization of gastric variceal supply   -Has received 11 units PRBC, 3 units FFP, 1 dose Vitamin K   12/11 Admit to [facility]ists at [facility] 12/11 upper endoscopy at [facility] >>severe erosive gastritis, new varices 12/12 Hematemesis, txfr to [facility] ICU then to [facility] 12/13 BRTO 12/15 repeat EGD ,grade 1 varices found in lower third of esophagus.Red blood found in cardia and fundus. Fresh blood           and clots noted. Despite suctioning and lavage, ongoing active bleeding noted, obscuring mucosal  visualization. 12/15 TIPS    GI bleeding due to gastric varices bleeding due to portal hypertension/cirrhosis -s/p BRTO 12/13 -S/p TIPS and gastric embolization12/15 -Monitor closely for hepatic encephalopathy given recent TIPS procedure, /discussed with patient and wife, they will adjust lactulose dosing for 3-4 good bowel movements per day . -Initially on octreotide drip, and Protonix drip, has been stopped, he is on Dexilant at home -Started on nadolol -Was treated with IV Rocephin during hospital stay given his gastric varices bleed.  Liver cirrhosis -Secondary to alcohol abuse -Elevated LFTs secondary to liver cirrhosis, trending down -Discharged on lactulose and rifaximin  Hemorrhagic shock/acute blood loss anemia -Secondary to upper GI bleed from gastric varices -Has received 11 units PRBC, 3 units FFP, 1 dose Vitamin K -He will be discharged on iron supplement, hemoglobin stable 7.7 on discharge  Alcohol abuse -On CIWA protocol during hospital stay, no evidence of withdrawals at time of discharge, continue with thiamine and multivitamins on discharge. -He was counseled  Hypomagnesemia/hypocalcemia -Monitor  closely and replete as needed   Discharge Condition:  stable   Follow UP  Follow-up Information    [name], MD Follow up in 1 week(s).   Specialty: Family Medicine Contact information: [place] [address] [phone]        Daneil Dolin, MD Follow up.   Specialty: Gastroenterology Contact information: [address] [place] [address] [phone]             Discharge Instructions  and  Discharge Medications     Discharge Instructions    Discharge instructions   Complete by: As directed    Follow with Primary MD [name], MD in 7 days   Get CBC, CMP, by Primary MD next visit.    Activity: As tolerated with Full fall precautions use walker/cane & assistance as  needed   Disposition Home    Diet: Low salt ,heart healthy , with feeding assistance and aspiration precautions.  For Heart failure patients - Check your Weight same time everyday, if you gain over 2 pounds, or you develop in leg swelling, experience more shortness of breath or chest pain, call your Primary MD immediately. Follow Cardiac Low Salt Diet and 1.5 lit/day fluid restriction.   On your next visit with your primary care physician please Get Medicines reviewed and adjusted.   Please request your Prim.MD to go over all Hospital Tests and Procedure/Radiological results at the follow up, please get all Hospital records sent to your Prim MD by signing hospital release before you go home.   If you experience worsening of your admission symptoms, develop shortness of breath, life threatening emergency, suicidal or homicidal thoughts you must seek medical attention immediately by calling 911 or calling your MD immediately  if symptoms less severe.  You Must read complete instructions/literature along with all the possible adverse reactions/side effects for all the Medicines you take and that have been prescribed to you. Take any new Medicines after you have completely understood and accpet all the possible adverse reactions/side effects.   Do not drive, operating heavy machinery, perform activities at heights, swimming or participation in water activities or provide baby sitting services if your were admitted for syncope or siezures until you have seen by Primary MD or a Neurologist and advised to do so again.  Do not drive when taking Pain medications.    Do not take more than prescribed Pain, Sleep and Anxiety Medications  Special Instructions: If you have smoked or chewed Tobacco  in the last 2 yrs please stop smoking, stop any regular Alcohol  and or any Recreational drug use.  Wear Seat belts while driving.   Please note  You were cared for by a hospitalist during your  hospital stay. If you have any questions about your discharge medications or the care you received while you were in the hospital after you are discharged, you can call the unit and asked to speak with the hospitalist on call if the hospitalist that took care of you is not available. Once you are discharged, your primary care physician will handle any further medical issues. Please note that NO REFILLS for any discharge medications will be authorized once you are discharged, as it is imperative that you return to your primary care physician (or establish a relationship  with a primary care physician if you do not have one) for your aftercare needs so that they can reassess your need for medications and monitor your lab values.   Increase activity slowly   Complete by: As directed      Allergies as of 11/29/2019   No Known Allergies     Medication List    STOP taking these medications   acebutolol 400 MG capsule Commonly known as: SECTRAL   aspirin EC 81 MG tablet   nortriptyline 50 MG capsule Commonly known as: PAMELOR   sildenafil 100 MG tablet Commonly known as: VIAGRA     TAKE these medications   allopurinol 100 MG tablet Commonly known as: ZYLOPRIM TAKE 1 TABLET BY MOUTH EVERY DAY What changed: when to take this   cholecalciferol 25 MCG (1000 UT) tablet Commonly known as: VITAMIN D3 Take 1,000 Units by mouth daily.   Dexilant 60 MG capsule Generic drug: dexlansoprazole TAKE 1 CAPSULE BY MOUTH EVERY DAY What changed: how much to take   famotidine 10 MG tablet Commonly known as: PEPCID Take 10 mg by mouth as needed for heartburn or indigestion.   ferrous sulfate 325 (65 FE) MG EC tablet Take 1 tablet (325 mg total) by mouth 2 (two) times daily.   FISH OIL PO Take 3 capsules by mouth as needed.   folic acid 101 MCG tablet Commonly known as: FOLVITE Take 400 mcg by mouth daily.   gabapentin 300 MG capsule Commonly known as: NEURONTIN TAKE 1 CAPSULE BY MOUTH FOUR  TIMES DAILY What changed:   how much to take  how to take this  when to take this  additional instructions   lactulose (encephalopathy) 10 GM/15ML Soln Commonly known as: CHRONULAC Take 15-30cc three times daily to Titrate for 3-4 soft bowel movements a day What changed: additional instructions   LORazepam 1 MG tablet Commonly known as: ATIVAN Take 0.5 tablets (0.5 mg total) by mouth 2 (two) times daily as needed. for anxiety What changed:   how much to take  reasons to take this   nadolol 40 MG tablet Commonly known as: CORGARD Take 1 tablet (40 mg total) by mouth daily. Start taking on: November 30, 2019   ondansetron 4 MG disintegrating tablet Commonly known as: ZOFRAN-ODT DISSOLVE 1 TABLET ON THE TONGUE EVERY 8 HOURS AS NEEDED FOR NAUSEA   PARoxetine 20 MG tablet Commonly known as: PAXIL TAKE 1 TABLET(20 MG) BY MOUTH DAILY   rifaximin 550 MG Tabs tablet Commonly known as: XIFAXAN Take 1 tablet (550 mg total) by mouth 2 (two) times daily.   sucralfate 1 g tablet Commonly known as: CARAFATE TAKE 1 TABLET BY MOUTH BEFORE MEALS   tobramycin-dexamethasone ophthalmic solution Commonly known as: TOBRADEX INSTILL 1 DROP INTO THE AFFECTED EYE TID FOR 5 DAYS   vitamin B-12 1000 MCG tablet Commonly known as: CYANOCOBALAMIN Take 1,000 mcg by mouth daily.         Diet and Activity recommendation: See Discharge Instructions above   Consults obtained -  PCCM, GI, IR   Major procedures and Radiology Reports - PLEASE review detailed and final reports for all details, in brief -     DG Chest 1 View  Result Date: 11/22/2019 CLINICAL DATA:  Central line placement EXAM: CHEST  1 VIEW COMPARISON:  06/01/2018 FINDINGS: Right neck vascular catheter is positioned with tip near the superior cavoatrial junction. Cardiomegaly. Mild, diffuse interstitial pulmonary opacity. IMPRESSION: 1. Right neck vascular catheter with tip near the superior  cavoatrial junction. 2.  Cardiomegaly with mild diffuse interstitial pulmonary opacity, which may reflect edema or infection. Electronically Signed   By: Eddie Candle M.D.   On: 11/22/2019 14:59   IR Angiogram Selective Each Additional Vessel  Result Date: 11/25/2019 CLINICAL DATA:  53 year old male with a history of life threatening of bleeding secondary to portal hypertension and gastric varices. The patient is status post balloon occlusion retrograde trans venous obliteration (BRTO) on 11/22/2019, with recurrent hemorrhage confirmed by upper endoscopy EXAM: ULTRASOUND-GUIDED TRANS HEPATIC PORTAL VEIN ACCESS ULTRASOUND GUIDED ACCESS RIGHT JUGULAR VEIN. TIPS PLACEMENT COIL AND PLUG EMBOLIZATION OF MULTIPLE PORTAL VEIN TRIBUTARIES TO A PERSISTING ESOPHAGOGASTRIC VARICEAL NETWORK MEDICATIONS: As antibiotic prophylaxis, 2 G ANCEF was ordered pre-procedure and administered intravenously within one hour of incision. One unit of platelets was also administered IV during the procedure. ANESTHESIA/SEDATION: General - as administered by the Anesthesia department Vascular Interventional Radiology physicians: Dr. Earleen Newport, Dr. Kathlene Cote, Dr. Laurence Ferrari CONTRAST:  71m OMNIPAQUE IOHEXOL 300 MG/ML SOLN, 72mOMNIPAQUE IOHEXOL 300 MG/ML SOLN, 7030mMNIPAQUE IOHEXOL 300 MG/ML SOLN FLUOROSCOPY TIME:  Fluoroscopy Time: 66 minutes 36 seconds (6280 mGy). COMPLICATIONS: None PROCEDURE: Informed written consent was obtained from the patient's family after a thorough discussion of the procedural risks, benefits and alternatives. Specific risks discussed with TIPS/variceal embolization included: Bleeding, infection, vascular injury, need for further procedure/surgery, renal injury/renal failure, contrast reaction, non-target embolization, liver dysfunction/failure, hepatic encephalopathy, stroke (~1%), cardiopulmonary collapse, death. All questions were addressed. Maximal Sterile Barrier Technique was utilized including caps, mask, sterile gowns, sterile  gloves, sterile drape, hand hygiene and skin antiseptic. A timeout was performed prior to the initiation of the procedure. Patient was positioned supine position on the table, with the right upper quadrant and the right neck prepped and draped in the usual sterile fashion. Ultrasound survey of the right upper quadrant was performed, with images stored and sent to PACs. Using ultrasound guidance, Chiba needle was used access the right portal system. Once we confirmed position in the portal system with portal venography, micro wire was advanced into the inferior mesenteric vein. The inner dilator was advanced over the metal stiffener into the portal system, with the inner dilator advanced over the wire. Venogram confirmed are location within the portal system. The micro wire was then replaced into the inner dilator, with the transition point position at the apex of the catheter. Check flow vial was placed on the back and of the inner dilator. Ultrasound survey was then performed of the right neck, with images stored and sent to PACs. Ultrasound guidance was then used to access the right internal jugular vein with a micro puncture kit. The wire was advanced under fluoroscopy into the right atrium, and a small incision was made. The needle was removed and the dilator was placed. The micro wire in the stiffener were removed and an 035 wire was then passed into the inferior vena cava. Ten French soft tissue dilation was performed on the wire, and then 10 [place] sheath was placed. Combination of Benson wire and multipurpose angled catheter were used to select the right hepatic vein. During the selection the right hepatic vein, accessory right hepatic vein was accessed with venography. Venogram confirmed location within the right hepatic vein. Tip sheath was then advanced over the catheter with a coaxial Amplatz wire, for a position cephalad to the transition point of the wire in the portal venous system. The Col[place]into  needle was then advanced through the TIPS sheath housed in the Teflon sheath over  the Amplatz wire. Using oblique projections, only a single pass was required to access the portal venous system. Of note, the liver was very dense, with difficulty penetrating the portal vein wall with the colapinto needle. Access into the portal venous system was confirmed by aspirating portal venous blood and a venogram. A stiff Glidewire was then used to navigate through the portal vein, ultimately with purchase in the IMV. Needle was removed. The stock 5 French glide catheter was attempted to pass over the Glidewire into the portal system. The tip would not penetrate the portal vein wall. A 4 French straight glide cath was then advanced on the Glidewire, and with forward tension, ultimately the glide cath penetrated the portal vein wall, with good purchase into the portal vein. Venogram confirmed location. Amplatz wire was then passed into the splenic vein through the Glidewire. The next step of dilating the soft tissue tract with series of balloons was quite difficult given the density of the liver. Initially a 4 mm by 40 mm balloon was successful passing into the portal vein with dilation of the soft tissue tract. Even after 4 mm balloon angioplasty, the sheath would not advanced over the Amplatz wire and balloon into the portal system, encountering resistance at the wall of the portal vein. 5 mm balloon angioplasty was then performed, with a similar resistance to passing the sheath into the portal vein. We attempted to pass the sheath with the coaxial dilator/introducer which was unsuccessful. 6 mm balloon angioplasty along the length of the tissue tract was ultimately successful, only after several reposition of the Amplatz wire into the distal splenic vein with the use of the glide catheter. Once the tip sheath was in the portal system, we confirmed location with angiogram. We selected a 10 mm x 2 x 8 viator stent, which was  deployed and post dilated to 10 mm. Venogram confirm excellent flow through the TIPS shunt. Pigtail catheter was then advanced into the splenic vein with repeat portal venogram. This demonstrated significant perfusion of the gastroesophageal network via left gastric, posterior gastric, and accessory posterior gastric contribution. We proceeded with embolization. Angled multipurpose catheter was used to select the largest of the posterior gastric veins. Coil embolization then proceeded with fibered 035 interlock coils, using multiple 10 mm diameter, 8 mm diameter, and 6 mm diameter coils. Once the flow was static in the first posterior gastric vein contribution, the catheter was withdrawn and repeat venogram was performed demonstrating accessory posterior gastric vein. This vein was selected with the 035 multipurpose catheter and coil embolized with fibered interlock coils, as well as a Amplatzer 4 plug. Repeat splenic vein/portal vein venogram demonstrated occlusion of enlarged short gastric vein after the sclerotherapy treatment, with persisting left gastric vein contribution to the gastroesophageal network. The multipurpose catheter was then used to select these left gastric vein contributors, which were then coil embolized/plug embolized with a combination of fibrin coils and Amplatzer 4. Final angiogram was performed. The access in the right portal system was removed. Access in the right internal jugular vein was removed with hemostasis achieved. Sterile bandage was placed. Patient tolerated the procedure well and remained hemodynamically stable throughout. No complications were encountered and no significant blood loss. FINDINGS: Ultrasound guided access right internal jugular vein demonstrates wide patency. Creation of the portal systemic shunt demonstrated a significantly dense liver parenchyma, of which even the colapinto needle had difficulty crossing. Excellent flow through the TIPS shunt after creation,  with 10 mm by 2 x 8 stent  placed, dilated to 10 mm. Multiple portal contribution identified from the portal and splenic vein including posterior gastric, accessory posterior gastric, left gastric, and accessory left gastric venous inflow into a persisting gastroesophageal varices network. Each of these was coil embolized/plug embolized to stasis with no persisting flow into the network upon completion. The preferential portal venous contribution that was treated during the balloon occlusion retrograde trans venous ablation on Sunday was a large short gastric vein which remains occluded on today's study. Persisting changes of prior sclerotherapy in the gastric variceal system. IMPRESSION: Status post TIPS creation and treatment of persisting portal contribution to gastroesophageal variceal network by combination of coil and plug embolization to stasis. Signed, Dulcy Fanny. Dellia Nims, RPVI [facility] Radiology Electronically Signed   By: Corrie Mckusick D.O.   On: 11/25/2019 13:21   IR Angiogram Selective Each Additional Vessel  Result Date: 11/25/2019 CLINICAL DATA:  53 year old male with a history of life threatening of bleeding secondary to portal hypertension and gastric varices. The patient is status post balloon occlusion retrograde trans venous obliteration (BRTO) on 11/22/2019, with recurrent hemorrhage confirmed by upper endoscopy EXAM: ULTRASOUND-GUIDED TRANS HEPATIC PORTAL VEIN ACCESS ULTRASOUND GUIDED ACCESS RIGHT JUGULAR VEIN. TIPS PLACEMENT COIL AND PLUG EMBOLIZATION OF MULTIPLE PORTAL VEIN TRIBUTARIES TO A PERSISTING ESOPHAGOGASTRIC VARICEAL NETWORK MEDICATIONS: As antibiotic prophylaxis, 2 G ANCEF was ordered pre-procedure and administered intravenously within one hour of incision. One unit of platelets was also administered IV during the procedure. ANESTHESIA/SEDATION: General - as administered by the Anesthesia department Vascular Interventional  Radiology physicians: Dr. Earleen Newport, Dr. Kathlene Cote, Dr. Laurence Ferrari CONTRAST:  58m OMNIPAQUE IOHEXOL 300 MG/ML SOLN, 727mOMNIPAQUE IOHEXOL 300 MG/ML SOLN, 7061mMNIPAQUE IOHEXOL 300 MG/ML SOLN FLUOROSCOPY TIME:  Fluoroscopy Time: 66 minutes 36 seconds (6280 mGy). COMPLICATIONS: None PROCEDURE: Informed written consent was obtained from the patient's family after a thorough discussion of the procedural risks, benefits and alternatives. Specific risks discussed with TIPS/variceal embolization included: Bleeding, infection, vascular injury, need for further procedure/surgery, renal injury/renal failure, contrast reaction, non-target embolization, liver dysfunction/failure, hepatic encephalopathy, stroke (~1%), cardiopulmonary collapse, death. All questions were addressed. Maximal Sterile Barrier Technique was utilized including caps, mask, sterile gowns, sterile gloves, sterile drape, hand hygiene and skin antiseptic. A timeout was performed prior to the initiation of the procedure. Patient was positioned supine position on the table, with the right upper quadrant and the right neck prepped and draped in the usual sterile fashion. Ultrasound survey of the right upper quadrant was performed, with images stored and sent to PACs. Using ultrasound guidance, Chiba needle was used access the right portal system. Once we confirmed position in the portal system with portal venography, micro wire was advanced into the inferior mesenteric vein. The inner dilator was advanced over the metal stiffener into the portal system, with the inner dilator advanced over the wire. Venogram confirmed are location within the portal system. The micro wire was then replaced into the inner dilator, with the transition point position at the apex of the catheter. Check flow vial was placed on the back and of the inner dilator. Ultrasound survey was then performed of the right neck, with images stored and sent to PACs. Ultrasound guidance was then  used to access the right internal jugular vein with a micro puncture kit. The wire was advanced under fluoroscopy into the right atrium, and a small incision was made. The needle was removed and the dilator was placed. The micro wire in the stiffener were removed and  an 035 wire was then passed into the inferior vena cava. Ten French soft tissue dilation was performed on the wire, and then 10 [place] TIPS sheath was placed. Combination of Benson wire and multipurpose angled catheter were used to select the right hepatic vein. During the selection the right hepatic vein, accessory right hepatic vein was accessed with venography. Venogram confirmed location within the right hepatic vein. Tip sheath was then advanced over the catheter with a coaxial Amplatz wire, for a position cephalad to the transition point of the wire in the portal venous system. The Colapinto needle was then advanced through the TIPS sheath housed in the Teflon sheath over the Amplatz wire. Using oblique projections, only a single pass was required to access the portal venous system. Of note, the liver was very dense, with difficulty penetrating the portal vein wall with the colapinto needle. Access into the portal venous system was confirmed by aspirating portal venous blood and a venogram. A stiff Glidewire was then used to navigate through the portal vein, ultimately with purchase in the IMV. Needle was removed. The stock 5 French glide catheter was attempted to pass over the Glidewire into the portal system. The tip would not penetrate the portal vein wall. A 4 French straight glide cath was then advanced on the Glidewire, and with forward tension, ultimately the glide cath penetrated the portal vein wall, with good purchase into the portal vein. Venogram confirmed location. Amplatz wire was then passed into the splenic vein through the Glidewire. The next step of dilating the soft tissue tract with series of balloons was quite difficult given  the density of the liver. Initially a 4 mm by 40 mm balloon was successful passing into the portal vein with dilation of the soft tissue tract. Even after 4 mm balloon angioplasty, the sheath would not advanced over the Amplatz wire and balloon into the portal system, encountering resistance at the wall of the portal vein. 5 mm balloon angioplasty was then performed, with a similar resistance to passing the sheath into the portal vein. We attempted to pass the sheath with the coaxial dilator/introducer which was unsuccessful. 6 mm balloon angioplasty along the length of the tissue tract was ultimately successful, only after several reposition of the Amplatz wire into the distal splenic vein with the use of the glide catheter. Once the tip sheath was in the portal system, we confirmed location with angiogram. We selected a 10 mm x 2 x 8 viator stent, which was deployed and post dilated to 10 mm. Venogram confirm excellent flow through the TIPS shunt. Pigtail catheter was then advanced into the splenic vein with repeat portal venogram. This demonstrated significant perfusion of the gastroesophageal network via left gastric, posterior gastric, and accessory posterior gastric contribution. We proceeded with embolization. Angled multipurpose catheter was used to select the largest of the posterior gastric veins. Coil embolization then proceeded with fibered 035 interlock coils, using multiple 10 mm diameter, 8 mm diameter, and 6 mm diameter coils. Once the flow was static in the first posterior gastric vein contribution, the catheter was withdrawn and repeat venogram was performed demonstrating accessory posterior gastric vein. This vein was selected with the 035 multipurpose catheter and coil embolized with fibered interlock coils, as well as a Amplatzer 4 plug. Repeat splenic vein/portal vein venogram demonstrated occlusion of enlarged short gastric vein after the sclerotherapy treatment, with persisting left gastric  vein contribution to the gastroesophageal network. The multipurpose catheter was then used to select these left gastric  vein contributors, which were then coil embolized/plug embolized with a combination of fibrin coils and Amplatzer 4. Final angiogram was performed. The access in the right portal system was removed. Access in the right internal jugular vein was removed with hemostasis achieved. Sterile bandage was placed. Patient tolerated the procedure well and remained hemodynamically stable throughout. No complications were encountered and no significant blood loss. FINDINGS: Ultrasound guided access right internal jugular vein demonstrates wide patency. Creation of the portal systemic shunt demonstrated a significantly dense liver parenchyma, of which even the colapinto needle had difficulty crossing. Excellent flow through the TIPS shunt after creation, with 10 mm by 2 x 8 stent placed, dilated to 10 mm. Multiple portal contribution identified from the portal and splenic vein including posterior gastric, accessory posterior gastric, left gastric, and accessory left gastric venous inflow into a persisting gastroesophageal varices network. Each of these was coil embolized/plug embolized to stasis with no persisting flow into the network upon completion. The preferential portal venous contribution that was treated during the balloon occlusion retrograde trans venous ablation on Sunday was a large short gastric vein which remains occluded on today's study. Persisting changes of prior sclerotherapy in the gastric variceal system. IMPRESSION: Status post TIPS creation and treatment of persisting portal contribution to gastroesophageal variceal network by combination of coil and plug embolization to stasis. Signed, Dulcy Fanny. Dellia Nims, RPVI [facility] Radiology Electronically Signed   By: Corrie Mckusick D.O.   On: 11/25/2019 13:21   IR Angiogram Selective Each  Additional Vessel  Result Date: 11/25/2019 CLINICAL DATA:  53 year old male with a history of life threatening of bleeding secondary to portal hypertension and gastric varices. The patient is status post balloon occlusion retrograde trans venous obliteration (BRTO) on 11/22/2019, with recurrent hemorrhage confirmed by upper endoscopy EXAM: ULTRASOUND-GUIDED TRANS HEPATIC PORTAL VEIN ACCESS ULTRASOUND GUIDED ACCESS RIGHT JUGULAR VEIN. TIPS PLACEMENT COIL AND PLUG EMBOLIZATION OF MULTIPLE PORTAL VEIN TRIBUTARIES TO A PERSISTING ESOPHAGOGASTRIC VARICEAL NETWORK MEDICATIONS: As antibiotic prophylaxis, 2 G ANCEF was ordered pre-procedure and administered intravenously within one hour of incision. One unit of platelets was also administered IV during the procedure. ANESTHESIA/SEDATION: General - as administered by the Anesthesia department Vascular Interventional Radiology physicians: Dr. Earleen Newport, Dr. Kathlene Cote, Dr. Laurence Ferrari CONTRAST:  11m OMNIPAQUE IOHEXOL 300 MG/ML SOLN, 769mOMNIPAQUE IOHEXOL 300 MG/ML SOLN, 7031mMNIPAQUE IOHEXOL 300 MG/ML SOLN FLUOROSCOPY TIME:  Fluoroscopy Time: 66 minutes 36 seconds (6280 mGy). COMPLICATIONS: None PROCEDURE: Informed written consent was obtained from the patient's family after a thorough discussion of the procedural risks, benefits and alternatives. Specific risks discussed with TIPS/variceal embolization included: Bleeding, infection, vascular injury, need for further procedure/surgery, renal injury/renal failure, contrast reaction, non-target embolization, liver dysfunction/failure, hepatic encephalopathy, stroke (~1%), cardiopulmonary collapse, death. All questions were addressed. Maximal Sterile Barrier Technique was utilized including caps, mask, sterile gowns, sterile gloves, sterile drape, hand hygiene and skin antiseptic. A timeout was performed prior to the initiation of the procedure. Patient was positioned supine position on the table, with the right upper quadrant  and the right neck prepped and draped in the usual sterile fashion. Ultrasound survey of the right upper quadrant was performed, with images stored and sent to PACs. Using ultrasound guidance, Chiba needle was used access the right portal system. Once we confirmed position in the portal system with portal venography, micro wire was advanced into the inferior mesenteric vein. The inner dilator was advanced over the metal stiffener into the portal system, with the inner dilator  advanced over the wire. Venogram confirmed are location within the portal system. The micro wire was then replaced into the inner dilator, with the transition point position at the apex of the catheter. Check flow vial was placed on the back and of the inner dilator. Ultrasound survey was then performed of the right neck, with images stored and sent to PACs. Ultrasound guidance was then used to access the right internal jugular vein with a micro puncture kit. The wire was advanced under fluoroscopy into the right atrium, and a small incision was made. The needle was removed and the dilator was placed. The micro wire in the stiffener were removed and an 035 wire was then passed into the inferior vena cava. Ten French soft tissue dilation was performed on the wire, and then 10 [place] TIPS sheath was placed. Combination of Benson wire and multipurpose angled catheter were used to select the right hepatic vein. During the selection the right hepatic vein, accessory right hepatic vein was accessed with venography. Venogram confirmed location within the right hepatic vein. Tip sheath was then advanced over the catheter with a coaxial Amplatz wire, for a position cephalad to the transition point of the wire in the portal venous system. The Colapinto needle was then advanced through the TIPS sheath housed in the Teflon sheath over the Amplatz wire. Using oblique projections, only a single pass was required to access the portal venous system. Of note,  the liver was very dense, with difficulty penetrating the portal vein wall with the colapinto needle. Access into the portal venous system was confirmed by aspirating portal venous blood and a venogram. A stiff Glidewire was then used to navigate through the portal vein, ultimately with purchase in the IMV. Needle was removed. The stock 5 French glide catheter was attempted to pass over the Glidewire into the portal system. The tip would not penetrate the portal vein wall. A 4 French straight glide cath was then advanced on the Glidewire, and with forward tension, ultimately the glide cath penetrated the portal vein wall, with good purchase into the portal vein. Venogram confirmed location. Amplatz wire was then passed into the splenic vein through the Glidewire. The next step of dilating the soft tissue tract with series of balloons was quite difficult given the density of the liver. Initially a 4 mm by 40 mm balloon was successful passing into the portal vein with dilation of the soft tissue tract. Even after 4 mm balloon angioplasty, the sheath would not advanced over the Amplatz wire and balloon into the portal system, encountering resistance at the wall of the portal vein. 5 mm balloon angioplasty was then performed, with a similar resistance to passing the sheath into the portal vein. We attempted to pass the sheath with the coaxial dilator/introducer which was unsuccessful. 6 mm balloon angioplasty along the length of the tissue tract was ultimately successful, only after several reposition of the Amplatz wire into the distal splenic vein with the use of the glide catheter. Once the tip sheath was in the portal system, we confirmed location with angiogram. We selected a 10 mm x 2 x 8 viator stent, which was deployed and post dilated to 10 mm. Venogram confirm excellent flow through the TIPS shunt. Pigtail catheter was then advanced into the splenic vein with repeat portal venogram. This demonstrated  significant perfusion of the gastroesophageal network via left gastric, posterior gastric, and accessory posterior gastric contribution. We proceeded with embolization. Angled multipurpose catheter was used to select the largest  of the posterior gastric veins. Coil embolization then proceeded with fibered 035 interlock coils, using multiple 10 mm diameter, 8 mm diameter, and 6 mm diameter coils. Once the flow was static in the first posterior gastric vein contribution, the catheter was withdrawn and repeat venogram was performed demonstrating accessory posterior gastric vein. This vein was selected with the 035 multipurpose catheter and coil embolized with fibered interlock coils, as well as a Amplatzer 4 plug. Repeat splenic vein/portal vein venogram demonstrated occlusion of enlarged short gastric vein after the sclerotherapy treatment, with persisting left gastric vein contribution to the gastroesophageal network. The multipurpose catheter was then used to select these left gastric vein contributors, which were then coil embolized/plug embolized with a combination of fibrin coils and Amplatzer 4. Final angiogram was performed. The access in the right portal system was removed. Access in the right internal jugular vein was removed with hemostasis achieved. Sterile bandage was placed. Patient tolerated the procedure well and remained hemodynamically stable throughout. No complications were encountered and no significant blood loss. FINDINGS: Ultrasound guided access right internal jugular vein demonstrates wide patency. Creation of the portal systemic shunt demonstrated a significantly dense liver parenchyma, of which even the colapinto needle had difficulty crossing. Excellent flow through the TIPS shunt after creation, with 10 mm by 2 x 8 stent placed, dilated to 10 mm. Multiple portal contribution identified from the portal and splenic vein including posterior gastric, accessory posterior gastric, left gastric,  and accessory left gastric venous inflow into a persisting gastroesophageal varices network. Each of these was coil embolized/plug embolized to stasis with no persisting flow into the network upon completion. The preferential portal venous contribution that was treated during the balloon occlusion retrograde trans venous ablation on Sunday was a large short gastric vein which remains occluded on today's study. Persisting changes of prior sclerotherapy in the gastric variceal system. IMPRESSION: Status post TIPS creation and treatment of persisting portal contribution to gastroesophageal variceal network by combination of coil and plug embolization to stasis. Signed, Dulcy Fanny. Dellia Nims, RPVI Vascular and Interventional Radiology Specialists [facility] Radiology Electronically Signed   By: Corrie Mckusick D.O.   On: 11/25/2019 13:21   IR Angiogram Selective Each Additional Vessel  Result Date: 11/22/2019 CLINICAL DATA:  53 year old male with alcoholic cirrhosis and upper at GI hemorrhage, transferred from [facility] with gastric variceal hemorrhage, status post endoscopic identification and clipping. He has continued to have GI blood loss with need for pressor support, and presents now for urgent treatment with BRTO and/or TIPS EXAM: ULTRASOUND GUIDED ACCESS RIGHT COMMON FEMORAL VEIN LEFT RENAL VEIN ANGIOGRAM RETROGRADE SCLEROTHERAPY OF GASTRIC VARICES, ASSISTED WITH COIL EMBOLIZATION MEDICATIONS: As antibiotic prophylaxis, 2 G ANCEF was ordered pre-procedure and administered intravenously within one hour of incision. One unit of platelets was also administered IV during the procedure. ANESTHESIA/SEDATION: General - as administered by the Anesthesia department CONTRAST:  90 cc IV contrast FLUOROSCOPY TIME:  Fluoroscopy Time: 19 minutes 42 seconds (12/29/2041 mGy). COMPLICATIONS: None PROCEDURE: Informed written consent was obtained from the patient and the patient's family after a thorough discussion of  the procedural risks, benefits and alternatives. Specific risks that were addressed regarding BRTO include, bleeding, infection, contrast reaction, kidney injury, need for further procedure or surgery including TIPS and/ or endoscopy, pulmonary embolism, stroke, worsening of ascites, worsening of esophageal varices, paradoxical liver failure, varix rupture, cardiopulmonary collapse, death. All questions were addressed. Maximal Sterile Barrier Technique was utilized including caps, mask, sterile gowns, sterile gloves, sterile drape, hand  hygiene and skin antiseptic. A timeout was performed prior to the initiation of the procedure. Ultrasound survey of the right inguinal region was performed with images stored and sent to PACs. A single wall puncture needle was used access the right common femoral artery under ultrasound guidance with saline syringe. With venous blood flow returned, a Bentson wire was observed to enter the common femoral vein and the IVC under fluoroscopy. The needle was removed, and a small incision was made, and 9 [place] dilator was passed over the Bentson wire. A 10 French TIPS sheath was then passed over the Bentson wire after manually creating a significant leftward current on the catheter. The inner dilator and wire were removed, and a 5 [place] cobra catheter was passed over the Bentson wire into the IVC. Cobra catheter was used to select the left renal vein orifice. Combination of the Bentson wire, Glidewire, Rosen wire were used to gain purchased into the renal vein, with renal vein limited venogram performed to identify the in flowing gastro renal shunt. With a safety curve on a stiff Amplatz wire position into superior pole renal vein, the TIPS sheath was advanced into the left renal vein over the Cobra catheter. Pullback reinforced straight sheath selection technique (PRESSS) was performed, and as the tip sheath was withdrawn, contrast was used to identified the inflow of the gastro renal  shunt. With the coaxial Amplatz wire in position, a combination of a standard Kumpe catheter and a 4 French angled glide catheter were used to cannulate the orifice of the gastro renal shunt flowing shunt opposite the adrenal vein inflow. Once the Glidewire was advanced into the shunt, the catheter was advanced, and the Glidewire was exchanged for a stiff Amplatz wire with a safety curve on the tip. The renal vein buddy wire was withdrawn, and the TIPS sheath was advanced into the gastro renal shunt. As the 10 [place] TIPS sheath was clearly not long enough at 35 cm, a 45 cm 12 French sheath was placed. This sheath was then advanced into the most inferior aspects of the shunt orifice into the renal vein, though would not advance further given the length. Once the glide catheter and the Glidewire were in a reasonable location within the shunt, the catheter was withdrawn and then a balloon occlusion catheter was advanced over the Amplatz wire into the gastro renal shunt. Amplatz wire was then withdrawn. Angiogram was then performed to confirmed location within the gastro renal shunt outflow. Initial balloon inflation did not achieve stasis of the shunt, and the balloon was deflated with repositioning needed. There was difficulty passing the Bonneau balloon catheter into a more distal position of the shunt, likely secondary to tortuosity and/or venous web. Exchange for an 8 [place] Merci balloon, 95 cm, was performed on an Amplatz wire. A more distal position within the shunt was achieved. Balloon on the [facility] was inflated on the balloon occlusion catheter and was withdrawn to the first encountered web to achieve hemostasis. Angiogram confirmed stasis of flow. A CO2 angiogram was also performed, identifying the posterior gastric veins as the stopping point. A penumbra Lantern microcatheter and a fathom wire were navigated into the shunt. Once we confirmed location within the outflow with standard angiogram,  treatment was initiated. A foamed solution of detergent/sclerosant was mixed in a standard Tessari method, with 1:2:3 ratio of ethiodized oil: sodium tetradecyl sulfate: Room air. The sclerosed sent was infused under fluoroscopic guidance, with observation of reflux into the afferent veins. Once reflux was  identified into the posterior gastric vein, sclerosing was completed. This required only approximately 15 cc of foam sclerotherapy in the above ratio. Once the posterior gastric veins were identified, we stop treatment. The micro catheter was withdrawn to the tip of the balloon occlusion catheter, and coil mass was deposited with deployment of multiple Ruby coils. This was performed with 24 by 60 cm coil, 28 x 60 cm coil, and 3 by 32 x 60 cm coil. The balloon was deflated under fluoroscopy, with no motion identified. The Merci balloon was slightly withdrawn into the more distal shunt (closer to the renal vein) and angiogram was performed confirming complete stasis of flow beyond the coil mass. Once we were confident there was no migration, balloon was withdrawn and the sheath was withdrawn. Hemostasis was achieved with manual compression. Patient remained hemodynamically stable throughout. No complications were encountered and no significant blood loss was encountered. IMPRESSION: Status post ultrasound guided access right common femoral vein for coil-assisted BRTO (sclerotherapy) as treatment for life-threatening gastric variceal hemorrhage. Signed, Dulcy Fanny. Dellia Nims, [place] [facility] Inter[facility] Radiology Special[facility] Radiology Plan PLAN: Observe in PACU, then returned ICU Continue current management including serial H and H Local wound care management at the common femoral vein access site Repeat CT angiogram abdomen/pelvis after 24 hours-48 hours on this admission Follow-up vascular Interventional Radiology Clinic in 4-8 weeks Electronically Signed   By: Corrie Mckusick D.O.   On: 11/22/2019  14:32   IR Venogram Renal Uni Left  Result Date: 11/22/2019 CLINICAL DATA:  53 year old male with alcoholic cirrhosis and upper at GI hemorrhage, transferred from [facility] with gastric variceal hemorrhage, status post endoscopic identification and clipping. He has continued to have GI blood loss with need for pressor support, and presents now for urgent treatment with BRTO and/or TIPS EXAM: ULTRASOUND GUIDED ACCESS RIGHT COMMON FEMORAL VEIN LEFT RENAL VEIN ANGIOGRAM RETROGRADE SCLEROTHERAPY OF GASTRIC VARICES, ASSISTED WITH COIL EMBOLIZATION MEDICATIONS: As antibiotic prophylaxis, 2 G ANCEF was ordered pre-procedure and administered intravenously within one hour of incision. One unit of platelets was also administered IV during the procedure. ANESTHESIA/SEDATION: General - as administered by the Anesthesia department CONTRAST:  90 cc IV contrast FLUOROSCOPY TIME:  Fluoroscopy Time: 19 minutes 42 seconds (12/29/2041 mGy). COMPLICATIONS: None PROCEDURE: Informed written consent was obtained from the patient and the patient's family after a thorough discussion of the procedural risks, benefits and alternatives. Specific risks that were addressed regarding BRTO include, bleeding, infection, contrast reaction, kidney injury, need for further procedure or surgery including TIPS and/ or endoscopy, pulmonary embolism, stroke, worsening of ascites, worsening of esophageal varices, paradoxical liver failure, varix rupture, cardiopulmonary collapse, death. All questions were addressed. Maximal Sterile Barrier Technique was utilized including caps, mask, sterile gowns, sterile gloves, sterile drape, hand hygiene and skin antiseptic. A timeout was performed prior to the initiation of the procedure. Ultrasound survey of the right inguinal region was performed with images stored and sent to PACs. A single wall puncture needle was used access the right common femoral artery under ultrasound guidance with saline  syringe. With venous blood flow returned, a Bentson wire was observed to enter the common femoral vein and the IVC under fluoroscopy. The needle was removed, and a small incision was made, and 9 [place] dilator was passed over the Bentson wire. A 10 French TIPS sheath was then passed over the Bentson wire after manually creating a significant leftward current on the catheter. The inner dilator and wire were removed, and a  5 [place] cobra catheter was passed over the Bentson wire into the IVC. Cobra catheter was used to select the left renal vein orifice. Combination of the Bentson wire, Glidewire, Rosen wire were used to gain purchased into the renal vein, with renal vein limited venogram performed to identify the in flowing gastro renal shunt. With a safety curve on a stiff Amplatz wire position into superior pole renal vein, the TIPS sheath was advanced into the left renal vein over the Cobra catheter. Pullback reinforced straight sheath selection technique (PRESSS) was performed, and as the tip sheath was withdrawn, contrast was used to identified the inflow of the gastro renal shunt. With the coaxial Amplatz wire in position, a combination of a standard Kumpe catheter and a 4 French angled glide catheter were used to cannulate the orifice of the gastro renal shunt flowing shunt opposite the adrenal vein inflow. Once the Glidewire was advanced into the shunt, the catheter was advanced, and the Glidewire was exchanged for a stiff Amplatz wire with a safety curve on the tip. The renal vein buddy wire was withdrawn, and the TIPS sheath was advanced into the gastro renal shunt. As the 10 [place] TIPS sheath was clearly not long enough at 35 cm, a 45 cm 12 French sheath was placed. This sheath was then advanced into the most inferior aspects of the shunt orifice into the renal vein, though would not advance further given the length. Once the glide catheter and the Glidewire were in a reasonable location within the shunt,  the catheter was withdrawn and then a balloon occlusion catheter was advanced over the Amplatz wire into the gastro renal shunt. Amplatz wire was then withdrawn. Angiogram was then performed to confirmed location within the gastro renal shunt outflow. Initial balloon inflation did not achieve stasis of the shunt, and the balloon was deflated with repositioning needed. There was difficulty passing the [place]coal balloon catheter into a more distal position of the shunt, likely secondary to tortuosity and/or venous web. Exchange for an 8 [place] Merci balloon, 95 cm, was performed on an Amplatz wire. A more distal position within the shunt was achieved. Balloon on the [facility] was inflated on the balloon occlusion catheter and was withdrawn to the first encountered web to achieve hemostasis. Angiogram confirmed stasis of flow. A CO2 angiogram was also performed, identifying the posterior gastric veins as the stopping point. A penumbra Lantern microcatheter and a fathom wire were navigated into the shunt. Once we confirmed location within the outflow with standard angiogram, treatment was initiated. A foamed solution of detergent/sclerosant was mixed in a standard Tessari method, with 1:2:3 ratio of ethiodized oil: sodium tetradecyl sulfate: Room air. The sclerosed sent was infused under fluoroscopic guidance, with observation of reflux into the afferent veins. Once reflux was identified into the posterior gastric vein, sclerosing was completed. This required only approximately 15 cc of foam sclerotherapy in the above ratio. Once the posterior gastric veins were identified, we stop treatment. The micro catheter was withdrawn to the tip of the balloon occlusion catheter, and coil mass was deposited with deployment of multiple Ruby coils. This was performed with 24 by 60 cm coil, 28 x 60 cm coil, and 3 by 32 x 60 cm coil. The balloon was deflated under fluoroscopy, with no motion identified. The Merci balloon was  slightly withdrawn into the more distal shunt (closer to the renal vein) and angiogram was performed confirming complete stasis of flow beyond the coil mass. Once we were confident there  was no migration, balloon was withdrawn and the sheath was withdrawn. Hemostasis was achieved with manual compression. Patient remained hemodynamically stable throughout. No complications were encountered and no significant blood loss was encountered. IMPRESSION: Status post ultrasound guided access right common femoral vein for coil-assisted BRTO (sclerotherapy) as treatment for life-threatening gastric variceal hemorrhage. Signed, Dulcy Fanny. Dellia Nims, [place] [facility] Radiology Plan PLAN: Observe in PACU, then returned ICU Continue current management including serial H and H Local wound care management at the common femoral vein access site Repeat CT angiogram abdomen/pelvis after 24 hours-48 hours on this admission Follow-up vascular Interventional Radiology Clinic in 4-8 weeks Electronically Signed   By: Corrie Mckusick D.O.   On: 11/22/2019 14:32   IR Tips  Result Date: 11/25/2019 CLINICAL DATA:  53 year old male with a history of life threatening of bleeding secondary to portal hypertension and gastric varices. The patient is status post balloon occlusion retrograde trans venous obliteration (BRTO) on 11/22/2019, with recurrent hemorrhage confirmed by upper endoscopy EXAM: ULTRASOUND-GUIDED TRANS HEPATIC PORTAL VEIN ACCESS ULTRASOUND GUIDED ACCESS RIGHT JUGULAR VEIN. TIPS PLACEMENT COIL AND PLUG EMBOLIZATION OF MULTIPLE PORTAL VEIN TRIBUTARIES TO A PERSISTING ESOPHAGOGASTRIC VARICEAL NETWORK MEDICATIONS: As antibiotic prophylaxis, 2 G ANCEF was ordered pre-procedure and administered intravenously within one hour of incision. One unit of platelets was also administered IV during the procedure. ANESTHESIA/SEDATION: General - as administered by the Anesthesia department Vascular  Interventional Radiology physicians: Dr. Earleen Newport, Dr. Kathlene Cote, Dr. Laurence Ferrari CONTRAST:  98m OMNIPAQUE IOHEXOL 300 MG/ML SOLN, 71mOMNIPAQUE IOHEXOL 300 MG/ML SOLN, 7051mMNIPAQUE IOHEXOL 300 MG/ML SOLN FLUOROSCOPY TIME:  Fluoroscopy Time: 66 minutes 36 seconds (6280 mGy). COMPLICATIONS: None PROCEDURE: Informed written consent was obtained from the patient's family after a thorough discussion of the procedural risks, benefits and alternatives. Specific risks discussed with TIPS/variceal embolization included: Bleeding, infection, vascular injury, need for further procedure/surgery, renal injury/renal failure, contrast reaction, non-target embolization, liver dysfunction/failure, hepatic encephalopathy, stroke (~1%), cardiopulmonary collapse, death. All questions were addressed. Maximal Sterile Barrier Technique was utilized including caps, mask, sterile gowns, sterile gloves, sterile drape, hand hygiene and skin antiseptic. A timeout was performed prior to the initiation of the procedure. Patient was positioned supine position on the table, with the right upper quadrant and the right neck prepped and draped in the usual sterile fashion. Ultrasound survey of the right upper quadrant was performed, with images stored and sent to PACs. Using ultrasound guidance, Chiba needle was used access the right portal system. Once we confirmed position in the portal system with portal venography, micro wire was advanced into the inferior mesenteric vein. The inner dilator was advanced over the metal stiffener into the portal system, with the inner dilator advanced over the wire. Venogram confirmed are location within the portal system. The micro wire was then replaced into the inner dilator, with the transition point position at the apex of the catheter. Check flow vial was placed on the back and of the inner dilator. Ultrasound survey was then performed of the right neck, with images stored and sent to PACs. Ultrasound  guidance was then used to access the right internal jugular vein with a micro puncture kit. The wire was advanced under fluoroscopy into the right atrium, and a small incision was made. The needle was removed and the dilator was placed. The micro wire in the stiffener were removed and an 035 wire was then passed into the inferior vena cava. Ten French soft tissue dilation was performed on the wire,  and then 10 [place] TIPS sheath was placed. Combination of Benson wire and multipurpose angled catheter were used to select the right hepatic vein. During the selection the right hepatic vein, accessory right hepatic vein was accessed with venography. Venogram confirmed location within the right hepatic vein. Tip sheath was then advanced over the catheter with a coaxial Amplatz wire, for a position cephalad to the transition point of the wire in the portal venous system. The Colapinto needle was then advanced through the TIPS sheath housed in the Teflon sheath over the Amplatz wire. Using oblique projections, only a single pass was required to access the portal venous system. Of note, the liver was very dense, with difficulty penetrating the portal vein wall with the colapinto needle. Access into the portal venous system was confirmed by aspirating portal venous blood and a venogram. A stiff Glidewire was then used to navigate through the portal vein, ultimately with purchase in the IMV. Needle was removed. The stock 5 French glide catheter was attempted to pass over the Glidewire into the portal system. The tip would not penetrate the portal vein wall. A 4 French straight glide cath was then advanced on the Glidewire, and with forward tension, ultimately the glide cath penetrated the portal vein wall, with good purchase into the portal vein. Venogram confirmed location. Amplatz wire was then passed into the splenic vein through the Glidewire. The next step of dilating the soft tissue tract with series of balloons was  quite difficult given the density of the liver. Initially a 4 mm by 40 mm balloon was successful passing into the portal vein with dilation of the soft tissue tract. Even after 4 mm balloon angioplasty, the sheath would not advanced over the Amplatz wire and balloon into the portal system, encountering resistance at the wall of the portal vein. 5 mm balloon angioplasty was then performed, with a similar resistance to passing the sheath into the portal vein. We attempted to pass the sheath with the coaxial dilator/introducer which was unsuccessful. 6 mm balloon angioplasty along the length of the tissue tract was ultimately successful, only after several reposition of the Amplatz wire into the distal splenic vein with the use of the glide catheter. Once the tip sheath was in the portal system, we confirmed location with angiogram. We selected a 10 mm x 2 x 8 viator stent, which was deployed and post dilated to 10 mm. Venogram confirm excellent flow through the TIPS shunt. Pigtail catheter was then advanced into the splenic vein with repeat portal venogram. This demonstrated significant perfusion of the gastroesophageal network via left gastric, posterior gastric, and accessory posterior gastric contribution. We proceeded with embolization. Angled multipurpose catheter was used to select the largest of the posterior gastric veins. Coil embolization then proceeded with fibered 035 interlock coils, using multiple 10 mm diameter, 8 mm diameter, and 6 mm diameter coils. Once the flow was static in the first posterior gastric vein contribution, the catheter was withdrawn and repeat venogram was performed demonstrating accessory posterior gastric vein. This vein was selected with the 035 multipurpose catheter and coil embolized with fibered interlock coils, as well as a Amplatzer 4 plug. Repeat splenic vein/portal vein venogram demonstrated occlusion of enlarged short gastric vein after the sclerotherapy treatment, with  persisting left gastric vein contribution to the gastroesophageal network. The multipurpose catheter was then used to select these left gastric vein contributors, which were then coil embolized/plug embolized with a combination of fibrin coils and Amplatzer 4. Final angiogram was performed.  The access in the right portal system was removed. Access in the right internal jugular vein was removed with hemostasis achieved. Sterile bandage was placed. Patient tolerated the procedure well and remained hemodynamically stable throughout. No complications were encountered and no significant blood loss. FINDINGS: Ultrasound guided access right internal jugular vein demonstrates wide patency. Creation of the portal systemic shunt demonstrated a significantly dense liver parenchyma, of which even the colapinto needle had difficulty crossing. Excellent flow through the TIPS shunt after creation, with 10 mm by 2 x 8 stent placed, dilated to 10 mm. Multiple portal contribution identified from the portal and splenic vein including posterior gastric, accessory posterior gastric, left gastric, and accessory left gastric venous inflow into a persisting gastroesophageal varices network. Each of these was coil embolized/plug embolized to stasis with no persisting flow into the network upon completion. The preferential portal venous contribution that was treated during the balloon occlusion retrograde trans venous ablation on Sunday was a large short gastric vein which remains occluded on today's study. Persisting changes of prior sclerotherapy in the gastric variceal system. IMPRESSION: Status post TIPS creation and treatment of persisting portal contribution to gastroesophageal variceal network by combination of coil and plug embolization to stasis. Signed, Dulcy Fanny. Dellia Nims, [facility] and Interventional Radiology Specialists [facility] Radiology Electronically Signed   By: Corrie Mckusick D.O.   On: 11/25/2019 13:21   IR US  Guide Vasc Access Right  Result Date: 11/22/2019 CLINICAL DATA:  53 year old male with alcoholic cirrhosis and upper at GI hemorrhage, transferred from [facility] with gastric variceal hemorrhage, status post endoscopic identification and clipping. He has continued to have GI blood loss with need for pressor support, and presents now for urgent treatment with BRTO and/or TIPS EXAM: ULTRASOUND GUIDED ACCESS RIGHT COMMON FEMORAL VEIN LEFT RENAL VEIN ANGIOGRAM RETROGRADE SCLEROTHERAPY OF GASTRIC VARICES, ASSISTED WITH COIL EMBOLIZATION MEDICATIONS: As antibiotic prophylaxis, 2 G ANCEF was ordered pre-procedure and administered intravenously within one hour of incision. One unit of platelets was also administered IV during the procedure. ANESTHESIA/SEDATION: General - as administered by the Anesthesia department CONTRAST:  90 cc IV contrast FLUOROSCOPY TIME:  Fluoroscopy Time: 19 minutes 42 seconds (12/29/2041 mGy). COMPLICATIONS: None PROCEDURE: Informed written consent was obtained from the patient and the patient's family after a thorough discussion of the procedural risks, benefits and alternatives. Specific risks that were addressed regarding BRTO include, bleeding, infection, contrast reaction, kidney injury, need for further procedure or surgery including TIPS and/ or endoscopy, pulmonary embolism, stroke, worsening of ascites, worsening of esophageal varices, paradoxical liver failure, varix rupture, cardiopulmonary collapse, death. All questions were addressed. Maximal Sterile Barrier Technique was utilized including caps, mask, sterile gowns, sterile gloves, sterile drape, hand hygiene and skin antiseptic. A timeout was performed prior to the initiation of the procedure. Ultrasound survey of the right inguinal region was performed with images stored and sent to PACs. A single wall puncture needle was used access the right common femoral artery under ultrasound guidance with saline syringe. With  venous blood flow returned, a Bentson wire was observed to enter the common femoral vein and the IVC under fluoroscopy. The needle was removed, and a small incision was made, and 9 [place] dilator was passed over the Bentson wire. A 10 French TIPS sheath was then passed over the Bentson wire after manually creating a significant leftward current on the catheter. The inner dilator and wire were removed, and a 5 [place] cobra catheter was passed over the Bentson wire into the  IVC. Cobra catheter was used to select the left renal vein orifice. Combination of the Bentson wire, Glidewire, Rosen wire were used to gain purchased into the renal vein, with renal vein limited venogram performed to identify the in flowing gastro renal shunt. With a safety curve on a stiff Amplatz wire position into superior pole renal vein, the TIPS sheath was advanced into the left renal vein over the Cobra catheter. Pullback reinforced straight sheath selection technique (PRESSS) was performed, and as the tip sheath was withdrawn, contrast was used to identified the inflow of the gastro renal shunt. With the coaxial Amplatz wire in position, a combination of a standard Kumpe catheter and a 4 French angled glide catheter were used to cannulate the orifice of the gastro renal shunt flowing shunt opposite the adrenal vein inflow. Once the Glidewire was advanced into the shunt, the catheter was advanced, and the Glidewire was exchanged for a stiff Amplatz wire with a safety curve on the tip. The renal vein buddy wire was withdrawn, and the TIPS sheath was advanced into the gastro renal shunt. As the 10 [place] TIPS sheath was clearly not long enough at 35 cm, a 45 cm 12 French sheath was placed. This sheath was then advanced into the most inferior aspects of the shunt orifice into the renal vein, though would not advance further given the length. Once the glide catheter and the Glidewire were in a reasonable location within the shunt, the catheter  was withdrawn and then a balloon occlusion catheter was advanced over the Amplatz wire into the gastro renal shunt. Amplatz wire was then withdrawn. Angiogram was then performed to confirmed location within the gastro renal shunt outflow. Initial balloon inflation did not achieve stasis of the shunt, and the balloon was deflated with repositioning needed. There was difficulty passing the [place] balloon catheter into a more distal position of the shunt, likely secondary to tortuosity and/or venous web. Exchange for an 8 [place] Merci balloon, 95 cm, was performed on an Amplatz wire. A more distal position within the shunt was achieved. Balloon on the [facility] was inflated on the balloon occlusion catheter and was withdrawn to the first encountered web to achieve hemostasis. Angiogram confirmed stasis of flow. A CO2 angiogram was also performed, identifying the posterior gastric veins as the stopping point. A penumbra Lantern microcatheter and a fathom wire were navigated into the shunt. Once we confirmed location within the outflow with standard angiogram, treatment was initiated. A foamed solution of detergent/sclerosant was mixed in a standard Tessari method, with 1:2:3 ratio of ethiodized oil: sodium tetradecyl sulfate: Room air. The sclerosed sent was infused under fluoroscopic guidance, with observation of reflux into the afferent veins. Once reflux was identified into the posterior gastric vein, sclerosing was completed. This required only approximately 15 cc of foam sclerotherapy in the above ratio. Once the posterior gastric veins were identified, we stop treatment. The micro catheter was withdrawn to the tip of the balloon occlusion catheter, and coil mass was deposited with deployment of multiple Ruby coils. This was performed with 24 by 60 cm coil, 28 x 60 cm coil, and 3 by 32 x 60 cm coil. The balloon was deflated under fluoroscopy, with no motion identified. The Merci balloon was slightly  withdrawn into the more distal shunt (closer to the renal vein) and angiogram was performed confirming complete stasis of flow beyond the coil mass. Once we were confident there was no migration, balloon was withdrawn and the sheath was withdrawn. Hemostasis  was achieved with manual compression. Patient remained hemodynamically stable throughout. No complications were encountered and no significant blood loss was encountered. IMPRESSION: Status post ultrasound guided access right common femoral vein for coil-assisted BRTO (sclerotherapy) as treatment for life-threatening gastric variceal hemorrhage. Signed, Dulcy Fanny. Dellia Nims, [place] [facility] Special[facility] Radiology Plan PLAN: Observe in PACU, then returned ICU Continue current management including serial H and H Local wound care management at the common femoral vein access site Repeat CT angiogram abdomen/pelvis after 24 hours-48 hours on this admission Follow-up vascular Interventional Radiology Clinic in 4-8 weeks Electronically Signed   By: Corrie Mckusick D.O.   On: 11/22/2019 14:32   DG Chest Port 1 View  Result Date: 11/28/2019 CLINICAL DATA:  Hypoxia. EXAM: PORTABLE CHEST 1 VIEW COMPARISON:  Chest x-ray dated November 24, 2019. FINDINGS: Interval removal of the right internal jugular central venous catheter. The heart size and mediastinal contours are within normal limits. Normal pulmonary vascularity. Unchanged elevation of the right hemidiaphragm with mild right basilar atelectasis and small right pleural effusion. Resolved left pleural effusion and basilar atelectasis. No pneumothorax. No acute osseous abnormality. IMPRESSION: 1. Unchanged small right pleural effusion and right basilar atelectasis. 2. Resolved left pleural effusion and basilar atelectasis. Electronically Signed   By: Titus Dubin M.D.   On: 11/28/2019 09:13   DG Chest Port 1 View  Result Date: 11/24/2019 CLINICAL DATA:  Abnormal breath  sounds EXAM: PORTABLE CHEST 1 VIEW COMPARISON:  Chest x-ray dated 11/22/2011 FINDINGS: The right-sided central venous catheter is stable in positioning. The heart size is enlarged. There are new small bilateral pleural effusions. There are new bibasilar airspace opacities favored to represent postoperative atelectasis. IMPRESSION: 1. New small bilateral pleural effusions and bibasilar airspace opacities favored to represent postoperative atelectasis. 2. Stable right central venous catheter. Electronically Signed   By: Constance Holster M.D.   On: 11/24/2019 21:39   IR EMBO ART  VEN HEMORR LYMPH EXTRAV  INC GUIDE ROADMAPPING  Result Date: 11/25/2019 CLINICAL DATA:  53 year old male with a history of life threatening of bleeding secondary to portal hypertension and gastric varices. The patient is status post balloon occlusion retrograde trans venous obliteration (BRTO) on 11/22/2019, with recurrent hemorrhage confirmed by upper endoscopy EXAM: ULTRASOUND-GUIDED TRANS HEPATIC PORTAL VEIN ACCESS ULTRASOUND GUIDED ACCESS RIGHT JUGULAR VEIN. TIPS PLACEMENT COIL AND PLUG EMBOLIZATION OF MULTIPLE PORTAL VEIN TRIBUTARIES TO A PERSISTING ESOPHAGOGASTRIC VARICEAL NETWORK MEDICATIONS: As antibiotic prophylaxis, 2 G ANCEF was ordered pre-procedure and administered intravenously within one hour of incision. One unit of platelets was also administered IV during the procedure. ANESTHESIA/SEDATION: General - as administered by the Anesthesia department Vascular Interventional Radiology physicians: Dr. Earleen Newport, Dr. Kathlene Cote, Dr. Laurence Ferrari CONTRAST:  76m OMNIPAQUE IOHEXOL 300 MG/ML SOLN, 715mOMNIPAQUE IOHEXOL 300 MG/ML SOLN, 7010mMNIPAQUE IOHEXOL 300 MG/ML SOLN FLUOROSCOPY TIME:  Fluoroscopy Time: 66 minutes 36 seconds (6280 mGy). COMPLICATIONS: None PROCEDURE: Informed written consent was obtained from the patient's family after a thorough discussion of the procedural risks, benefits and alternatives. Specific risks  discussed with TIPS/variceal embolization included: Bleeding, infection, vascular injury, need for further procedure/surgery, renal injury/renal failure, contrast reaction, non-target embolization, liver dysfunction/failure, hepatic encephalopathy, stroke (~1%), cardiopulmonary collapse, death. All questions were addressed. Maximal Sterile Barrier Technique was utilized including caps, mask, sterile gowns, sterile gloves, sterile drape, hand hygiene and skin antiseptic. A timeout was performed prior to the initiation of the procedure. Patient was positioned supine position on the table, with the right upper quadrant and the  right neck prepped and draped in the usual sterile fashion. Ultrasound survey of the right upper quadrant was performed, with images stored and sent to PACs. Using ultrasound guidance, Chiba needle was used access the right portal system. Once we confirmed position in the portal system with portal venography, micro wire was advanced into the inferior mesenteric vein. The inner dilator was advanced over the metal stiffener into the portal system, with the inner dilator advanced over the wire. Venogram confirmed are location within the portal system. The micro wire was then replaced into the inner dilator, with the transition point position at the apex of the catheter. Check flow vial was placed on the back and of the inner dilator. Ultrasound survey was then performed of the right neck, with images stored and sent to PACs. Ultrasound guidance was then used to access the right internal jugular vein with a micro puncture kit. The wire was advanced under fluoroscopy into the right atrium, and a small incision was made. The needle was removed and the dilator was placed. The micro wire in the stiffener were removed and an 035 wire was then passed into the inferior vena cava. Ten French soft tissue dilation was performed on the wire, and then 10 [place] TIPS sheath was placed. Combination of Benson wire  and multipurpose angled catheter were used to select the right hepatic vein. During the selection the right hepatic vein, accessory right hepatic vein was accessed with venography. Venogram confirmed location within the right hepatic vein. Tip sheath was then advanced over the catheter with a coaxial Amplatz wire, for a position cephalad to the transition point of the wire in the portal venous system. The Colapinto needle was then advanced through the TIPS sheath housed in the Teflon sheath over the Amplatz wire. Using oblique projections, only a single pass was required to access the portal venous system. Of note, the liver was very dense, with difficulty penetrating the portal vein wall with the colapinto needle. Access into the portal venous system was confirmed by aspirating portal venous blood and a venogram. A stiff Glidewire was then used to navigate through the portal vein, ultimately with purchase in the IMV. Needle was removed. The stock 5 French glide catheter was attempted to pass over the Glidewire into the portal system. The tip would not penetrate the portal vein wall. A 4 French straight glide cath was then advanced on the Glidewire, and with forward tension, ultimately the glide cath penetrated the portal vein wall, with good purchase into the portal vein. Venogram confirmed location. Amplatz wire was then passed into the splenic vein through the Glidewire. The next step of dilating the soft tissue tract with series of balloons was quite difficult given the density of the liver. Initially a 4 mm by 40 mm balloon was successful passing into the portal vein with dilation of the soft tissue tract. Even after 4 mm balloon angioplasty, the sheath would not advanced over the Amplatz wire and balloon into the portal system, encountering resistance at the wall of the portal vein. 5 mm balloon angioplasty was then performed, with a similar resistance to passing the sheath into the portal vein. We attempted  to pass the sheath with the coaxial dilator/introducer which was unsuccessful. 6 mm balloon angioplasty along the length of the tissue tract was ultimately successful, only after several reposition of the Amplatz wire into the distal splenic vein with the use of the glide catheter. Once the tip sheath was in the portal system, we confirmed  location with angiogram. We selected a 10 mm x 2 x 8 viator stent, which was deployed and post dilated to 10 mm. Venogram confirm excellent flow through the TIPS shunt. Pigtail catheter was then advanced into the splenic vein with repeat portal venogram. This demonstrated significant perfusion of the gastroesophageal network via left gastric, posterior gastric, and accessory posterior gastric contribution. We proceeded with embolization. Angled multipurpose catheter was used to select the largest of the posterior gastric veins. Coil embolization then proceeded with fibered 035 interlock coils, using multiple 10 mm diameter, 8 mm diameter, and 6 mm diameter coils. Once the flow was static in the first posterior gastric vein contribution, the catheter was withdrawn and repeat venogram was performed demonstrating accessory posterior gastric vein. This vein was selected with the 035 multipurpose catheter and coil embolized with fibered interlock coils, as well as a Amplatzer 4 plug. Repeat splenic vein/portal vein venogram demonstrated occlusion of enlarged short gastric vein after the sclerotherapy treatment, with persisting left gastric vein contribution to the gastroesophageal network. The multipurpose catheter was then used to select these left gastric vein contributors, which were then coil embolized/plug embolized with a combination of fibrin coils and Amplatzer 4. Final angiogram was performed. The access in the right portal system was removed. Access in the right internal jugular vein was removed with hemostasis achieved. Sterile bandage was placed. Patient tolerated the  procedure well and remained hemodynamically stable throughout. No complications were encountered and no significant blood loss. FINDINGS: Ultrasound guided access right internal jugular vein demonstrates wide patency. Creation of the portal systemic shunt demonstrated a significantly dense liver parenchyma, of which even the colapinto needle had difficulty crossing. Excellent flow through the TIPS shunt after creation, with 10 mm by 2 x 8 stent placed, dilated to 10 mm. Multiple portal contribution identified from the portal and splenic vein including posterior gastric, accessory posterior gastric, left gastric, and accessory left gastric venous inflow into a persisting gastroesophageal varices network. Each of these was coil embolized/plug embolized to stasis with no persisting flow into the network upon completion. The preferential portal venous contribution that was treated during the balloon occlusion retrograde trans venous ablation on Sunday was a large short gastric vein which remains occluded on today's study. Persisting changes of prior sclerotherapy in the gastric variceal system. IMPRESSION: Status post TIPS creation and treatment of persisting portal contribution to gastroesophageal variceal network by combination of coil and plug embolization to stasis. Signed, Dulcy Fanny. Dellia Nims, RPVI [facility] Radiology Electronically Signed   By: Corrie Mckusick D.O.   On: 11/25/2019 13:21   IR EMBO ART  VEN HEMORR LYMPH EXTRAV  INC GUIDE ROADMAPPING  Result Date: 11/22/2019 CLINICAL DATA:  53 year old male with alcoholic cirrhosis and upper at GI hemorrhage, transferred from [facility] with gastric variceal hemorrhage, status post endoscopic identification and clipping. He has continued to have GI blood loss with need for pressor support, and presents now for urgent treatment with BRTO and/or TIPS EXAM: ULTRASOUND GUIDED ACCESS RIGHT COMMON FEMORAL VEIN  LEFT RENAL VEIN ANGIOGRAM RETROGRADE SCLEROTHERAPY OF GASTRIC VARICES, ASSISTED WITH COIL EMBOLIZATION MEDICATIONS: As antibiotic prophylaxis, 2 G ANCEF was ordered pre-procedure and administered intravenously within one hour of incision. One unit of platelets was also administered IV during the procedure. ANESTHESIA/SEDATION: General - as administered by the Anesthesia department CONTRAST:  90 cc IV contrast FLUOROSCOPY TIME:  Fluoroscopy Time: 19 minutes 42 seconds (12/29/2041 mGy). COMPLICATIONS: None PROCEDURE: Informed written consent was obtained  from the patient and the patient's family after a thorough discussion of the procedural risks, benefits and alternatives. Specific risks that were addressed regarding BRTO include, bleeding, infection, contrast reaction, kidney injury, need for further procedure or surgery including TIPS and/ or endoscopy, pulmonary embolism, stroke, worsening of ascites, worsening of esophageal varices, paradoxical liver failure, varix rupture, cardiopulmonary collapse, death. All questions were addressed. Maximal Sterile Barrier Technique was utilized including caps, mask, sterile gowns, sterile gloves, sterile drape, hand hygiene and skin antiseptic. A timeout was performed prior to the initiation of the procedure. Ultrasound survey of the right inguinal region was performed with images stored and sent to PACs. A single wall puncture needle was used access the right common femoral artery under ultrasound guidance with saline syringe. With venous blood flow returned, a Bentson wire was observed to enter the common femoral vein and the IVC under fluoroscopy. The needle was removed, and a small incision was made, and 9 [place] dilator was passed over the Bentson wire. A 10 French TIPS sheath was then passed over the Bentson wire after manually creating a significant leftward current on the catheter. The inner dilator and wire were removed, and a 5 [place] cobra catheter was passed over  the Bentson wire into the IVC. Cobra catheter was used to select the left renal vein orifice. Combination of the Bentson wire, Glidewire, Rosen wire were used to gain purchased into the renal vein, with renal vein limited venogram performed to identify the in flowing gastro renal shunt. With a safety curve on a stiff Amplatz wire position into superior pole renal vein, the TIPS sheath was advanced into the left renal vein over the Cobra catheter. Pullback reinforced straight sheath selection technique (PRESSS) was performed, and as the tip sheath was withdrawn, contrast was used to identified the inflow of the gastro renal shunt. With the coaxial Amplatz wire in position, a combination of a standard Kumpe catheter and a 4 French angled glide catheter were used to cannulate the orifice of the gastro renal shunt flowing shunt opposite the adrenal vein inflow. Once the Glidewire was advanced into the shunt, the catheter was advanced, and the Glidewire was exchanged for a stiff Amplatz wire with a safety curve on the tip. The renal vein buddy wire was withdrawn, and the TIPS sheath was advanced into the gastro renal shunt. As the 10 [place] TIPS sheath was clearly not long enough at 35 cm, a 45 cm 12 French sheath was placed. This sheath was then advanced into the most inferior aspects of the shunt orifice into the renal vein, though would not advance further given the length. Once the glide catheter and the Glidewire were in a reasonable location within the shunt, the catheter was withdrawn and then a balloon occlusion catheter was advanced over the Amplatz wire into the gastro renal shunt. Amplatz wire was then withdrawn. Angiogram was then performed to confirmed location within the gastro renal shunt outflow. Initial balloon inflation did not achieve stasis of the shunt, and the balloon was deflated with repositioning needed. There was difficulty passing the Vowinckel balloon catheter into a more distal  position of the shunt, likely secondary to tortuosity and/or venous web. Exchange for an 8 [place] Merci balloon, 95 cm, was performed on an Amplatz wire. A more distal position within the shunt was achieved. Balloon on the [facility] was inflated on the balloon occlusion catheter and was withdrawn to the first encountered web to achieve hemostasis. Angiogram confirmed stasis of flow. A CO2  angiogram was also performed, identifying the posterior gastric veins as the stopping point. A penumbra Lantern microcatheter and a fathom wire were navigated into the shunt. Once we confirmed location within the outflow with standard angiogram, treatment was initiated. A foamed solution of detergent/sclerosant was mixed in a standard Tessari method, with 1:2:3 ratio of ethiodized oil: sodium tetradecyl sulfate: Room air. The sclerosed sent was infused under fluoroscopic guidance, with observation of reflux into the afferent veins. Once reflux was identified into the posterior gastric vein, sclerosing was completed. This required only approximately 15 cc of foam sclerotherapy in the above ratio. Once the posterior gastric veins were identified, we stop treatment. The micro catheter was withdrawn to the tip of the balloon occlusion catheter, and coil mass was deposited with deployment of multiple Ruby coils. This was performed with 24 by 60 cm coil, 28 x 60 cm coil, and 3 by 32 x 60 cm coil. The balloon was deflated under fluoroscopy, with no motion identified. The Merci balloon was slightly withdrawn into the more distal shunt (closer to the renal vein) and angiogram was performed confirming complete stasis of flow beyond the coil mass. Once we were confident there was no migration, balloon was withdrawn and the sheath was withdrawn. Hemostasis was achieved with manual compression. Patient remained hemodynamically stable throughout. No complications were encountered and no significant blood loss was encountered. IMPRESSION: Status post  ultrasound guided access right common femoral vein for coil-assisted BRTO (sclerotherapy) as treatment for life-threatening gastric variceal hemorrhage. Signed, Dulcy Fanny. Dellia Nims, [place] [facility] Radiology Plan PLAN: Observe in PACU, then returned ICU Continue current management including serial H and H Local wound care management at the common femoral vein access site Repeat CT angiogram abdomen/pelvis after 24 hours-48 hours on this admission Follow-up vascular Interventional Radiology Clinic in 4-8 weeks Electronically Signed   By: Corrie Mckusick D.O.   On: 11/22/2019 14:32   CT Angio Abd/Pel w/ and/or w/o  Result Date: 11/24/2019 CLINICAL DATA:  Status post BRTO, GI bleeding EXAM: CTA ABDOMEN AND PELVIS WITH CONTRAST TECHNIQUE: Multidetector CT imaging of the abdomen and pelvis was performed using the standard protocol during bolus administration of intravenous contrast. Multiplanar reconstructed images and MIPs were obtained and reviewed to evaluate the vascular anatomy. CONTRAST:  127m OMNIPAQUE IOHEXOL 350 MG/ML SOLN COMPARISON:  11/22/2019 FINDINGS: VASCULAR Aorta: Aorta atherosclerotic without significant aneurysm, dissection or occlusive process. No retroperitoneal hemorrhage or hematoma. Celiac: Main celiac origin is widely patent including its splenic and right hepatic branches. Separate origin of the left gastric and left hepatic artery also appearing patent. SMA: SMA origin is patent. Renals: Main renal arteries are widely patent. IMA: Small caliber IMA remains patent off the aorta. Inflow: Iliac atherosclerosis noted without significant inflow disease or occlusion. Proximal Outflow: Atherosclerotic changes of the proximal outflow. The right common femoral and proximal profunda femoral artery are both patent. Right proximal SFA occlusion noted. Left common femoral, profunda femoral, proximal SFA visualized are all patent. Veins: Dedicated venous  imaging not performed. Interval balloon retrograde trans obliteration of the gastric varices. Gastric varices appear largely occluded with radiopaque sclerotherapy and coils noted. Scattered areas non target radiopaque sclerotherapy within small branches of the short gastric veins about the spleen in the left upper quadrant. Overall limited assessment because venous phase imaging was not performed. Review of the MIP images confirms the above findings. NON-VASCULAR Lower chest: Increased dependent basilar atelectasis and very small left effusion. Normal heart size. No pericardial  effusion. Native coronary atherosclerosis. Hepatobiliary: Hepatic cirrhosis again evident with heterogeneous attenuation and micro nodularity to the surface. Limited assessment because of arterial phase imaging only. Small left hepatic dome subcentimeter probable cyst noted. Increased perihepatic ascites. Moderate gallbladder distension with layering gallstones again evident. No gross biliary obstruction or dilatation. Common bile duct nondilated. Pancreas: Unremarkable. No pancreatic ductal dilatation or surrounding inflammatory changes. Spleen: Stable mild splenomegaly.  Increased perisplenic ascites. Adrenals/Urinary Tract: Normal adrenal glands. No renal obstruction or hydronephrosis. No hydroureter or ureteral calculus. Bladder unremarkable. Stomach/Bowel: Negative for bowel obstruction, significant dilatation ileus or free air. Portions of the appendix are demonstrated and appear unremarkable. Increased stranding mesenteric edema. Exam of the bowel is limited but there is slight diffuse colonic bowel wall thickening suggesting mild colitis. New small amount abdominopelvic ascites. Lymphatic: No bulky adenopathy. Reproductive: No acute or significant finding by CT. Other: No inguinal or abdominal wall hernia. Increased mild diffuse lower body wall pelvic subcutaneous edema compatible with anasarca. Musculoskeletal: No acute osseous  finding. Bilateral L5 pars defects noted. No anterolisthesis associated. Acute compression fracture. IMPRESSION: VASCULAR No evidence of significant CTA arterial GI bleeding. 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 Small amount of scattered non target radiopaque sclerotherapy within the perisplenic short gastric veins noted. Aortoiliac atherosclerosis without aneurysm or occlusive process Mesenteric and renal vasculature appear patent centrally. Chronic appearing proximal right SFA occlusion NON-VASCULAR Minor basilar atelectasis and trace left effusion Hepatic cirrhosis with developing mesenteric edema, new small amount of abdominopelvic ascites, and body anasarca. Cholelithiasis Nonspecific mild colonic wall thickening. Difficult to exclude associated pancolitis. Electronically Signed   By: Jerilynn Mages.  Shick M.D.   On: 11/24/2019 13:09   CT Angio Abd/Pel w/ and/or w/o  Result Date: 11/22/2019 CLINICAL DATA:  GI bleed with large bloody bowel movement EXAM: CTA ABDOMEN AND PELVIS WITHOUT AND WITH CONTRAST TECHNIQUE: Multidetector CT imaging of the abdomen and pelvis was performed using the standard protocol during bolus administration of intravenous contrast. Multiplanar reconstructed images and MIPs were obtained and reviewed to evaluate the vascular anatomy. CONTRAST:  100 mL Omnipaque 350. COMPARISON:  06/02/2018 FINDINGS: VASCULAR Aorta: Abdominal aorta is well visualize without aneurysmal dilatation or focal dissection. Minimal atherosclerotic changes are noted. Celiac: Celiac axis is widely patent. The main celiac axis supplies the splenic artery as well as the right hepatic artery. Variant anatomy is noted with a smaller branch arising directly from the aorta just above the celiac axis supplying the left hepatic artery as well as left gastric artery. SMA: Patent without evidence of aneurysm, dissection, vasculitis or significant stenosis. Renals: Both renal arteries are  patent without evidence of aneurysm, dissection, vasculitis, fibromuscular dysplasia or significant stenosis. IMA: Patent without evidence of aneurysm, dissection, vasculitis or significant stenosis. Iliacs: Atherosclerotic changes are noted without focal stenosis Veins: IVC appears within normal limits. Multiple gastric varices are identified near the gastroesophageal junction and along the posterior aspect of the gastric fundus. The largest of these varices measures at least 1.8 cm in greatest dimension. No significant ascending esophageal varices are noted. The gastric varices seen drain into the left renal vein. No other venous abnormality is noted. Portal vein is widely patent. Small coronary vein is seen. Review of the MIP images confirms the above findings. NON-VASCULAR Lower chest: No acute abnormality. Hepatobiliary: Liver is well visualized. Mild heterogeneity of the liver is seen without focal mass. Very mild nodularity is noted. The gallbladder is well distended with dependent gallstones. No complicating factors are seen.  Mild perihepatic fluid is noted new from prior exam. Pancreas: Pancreas appears within normal limits. Spleen: Spleen shows no focal mass lesion. Splenic venous branches communicate with the gastric varices as well. Adrenals/Urinary Tract: The adrenal glands are within normal limits. Kidneys are well visualized bilaterally. No renal calculi or obstructive changes are seen. Delayed images demonstrate a normal enhancement pattern as well. The bladder is partially distended. Stomach/Bowel: Colon is well visualized. Scattered diverticular change is seen without evidence of diverticulitis. The appendix appears within normal limits. Small bowel is unremarkable. No area to suggest active hemorrhage in the small bowel or colon is seen. Stomach is well distended with water administered right before the exam. Some swirling of air and density is noted within the dependent portion of the stomach.  This could be related to thrombus given the known gastric varices. Endoscopic clip is noted adjacent to region of varices posteriorly. Lymphatic: No significant lymphadenopathy is noted. Reproductive: Prostate is unremarkable. Other: No abdominal wall hernia or abnormality. Minimal perihepatic fluid is noted as previously mentioned. Musculoskeletal: Bilateral pars defects are noted at L5 with only minimal anterolisthesis. No acute bony abnormality is noted. IMPRESSION: VASCULAR Variant arterial anatomy of the celiac axis as described above. Changes consistent with gastric varices intercommunicating between the splenic vein and left renal vein. The draining vein into the left renal vein measures approximately for 1.4 x 1.0 cm in diameter. Aortic Atherosclerosis (ICD10-I70.0). NON-VASCULAR Mild heterogeneity of the liver is noted with nodularity consistent with underlying cirrhosis. Diverticulosis without evidence of diverticulitis. Soft tissue density in the dependent portion of the stomach with air within. Given its proximity to the gastric varices along the posterior aspect of the gastric fundus this may represent thrombus. This area lies in an area of previous endoscopic clipping. No areas of active extravasation in the large or small bowel are noted. Electronically Signed   By: Inez Catalina M.D.   On: 11/22/2019 00:35    Micro Results     Recent Results (from the past 240 hour(s))  SARS Coronavirus 2 by RT PCR (hospital order, performed in [place] hospital lab)     Status: None   Collection Time: 11/19/19  9:33 PM  Result Value Ref Range Status   SARS Coronavirus 2 NEGATIVE NEGATIVE Final    Comment: (NOTE) SARS-CoV-2 target nucleic acids are NOT DETECTED. The SARS-CoV-2 RNA is generally detectable in upper and lower respiratory specimens during the acute phase of infection. The lowest concentration of SARS-CoV-2 viral copies this assay can detect is 250 copies / mL. A negative result does  not preclude SARS-CoV-2 infection and should not be used as the sole basis for treatment or other patient management decisions.  A negative result may occur with improper specimen collection / handling, submission of specimen other than nasopharyngeal swab, presence of viral mutation(s) within the areas targeted by this assay, and inadequate number of viral copies (<250 copies / mL). A negative result must be combined with clinical observations, patient history, and epidemiological information. Fact Sheet for Patients:   StrictlyIdeas.no Fact Sheet for Healthcare Providers: BankingDealers.co.za This test is not yet approved or cleared  by the [place] FDA and has been authorized for detection and/or diagnosis of SARS-CoV-2 by FDA under an Emergency Use Authorization (EUA).  This EUA will remain in effect (meaning this test can be used) for the duration of the COVID-19 declaration under Section 564(b)(1) of the Act, 21 U.S.C. section 360bbb-3(b)(1), unless the authorization is terminated or revoked sooner. Performed at  [facility], [place] [address] [place], [place] [address]   MRSA PCR Screening     Status: None   Collection Time: 11/21/19  4:43 AM   Specimen: Nasal Mucosa; Nasopharyngeal  Result Value Ref Range Status   MRSA by PCR NEGATIVE NEGATIVE Final    Comment:        The GeneXpert MRSA Assay (FDA approved for NASAL specimens only), is one component of a comprehensive MRSA colonization surveillance program. It is not intended to diagnose MRSA infection nor to guide or monitor treatment for MRSA infections. Performed at [facility], [address], [place], [place] [address]        Today   Subjective:   Samay Delcarlo today has no headache,no chest or abdominal pain, had 3 bowel movements yesterday, no blood in stools,  feels much better wants to go home today.   Objective:   Blood pressure 112/61, pulse  80, temperature 98.4 F (36.9 C), resp. rate 18, height _0  (1.854 m), weight 119 kg, SpO2 100 %.   Intake/Output Summary (Last 24 hours) at 11/29/2019 1108 Last data filed at 11/29/2019 0809 Gross per 24 hour  Intake 740 ml  Output --  Net 740 ml    Exam Awake Alert, Oriented x 3, No new F.N deficits, Normal affect Symmetrical Chest wall movement, Good air movement bilaterally, CTAB RRR,No Gallops,Rubs or new Murmurs, No Parasternal Heave +ve B.Sounds, Abd Soft, Non tender, No rebound -guarding or rigidity. No Cyanosis, Clubbing , Trace edema, No new Rash or bruise  Data Review   CBC w Diff:  Lab Results  Component Value Date   WBC 5.8 11/29/2019   HGB 7.7 (L) 11/29/2019   HGB 13.2 07/02/2018   HCT 25.0 (L) 11/29/2019   HCT 38.8 07/02/2018   PLT 95 (L) 11/29/2019   PLT 194 05/22/2018   LYMPHOPCT 9 11/24/2019   MONOPCT 18 11/24/2019   EOSPCT 2 11/24/2019   BASOPCT 1 11/24/2019    CMP:  Lab Results  Component Value Date   NA 138 11/29/2019   NA 140 07/02/2018   K 3.4 (L) 11/29/2019   CL 104 11/29/2019   CO2 29 11/29/2019   BUN 8 11/29/2019   BUN 6 07/02/2018   CREATININE 0.75 11/29/2019   CREATININE 0.74 08/19/2019   PROT 4.3 (L) 11/29/2019   PROT 7.0 07/02/2018   ALBUMIN 1.6 (L) 11/29/2019   ALBUMIN 3.6 07/02/2018   BILITOT 4.7 (H) 11/29/2019   BILITOT 0.6 07/02/2018   ALKPHOS 157 (H) 11/29/2019   AST 110 (H) 11/29/2019   ALT 56 (H) 11/29/2019  .   Total Time in preparing paper work, data evaluation and todays exam - 3 minutes  Phillips Climes M.D on 11/29/2019 at 11:08 AM  [facility]   Office  [phone]

## 2019-12-01 ENCOUNTER — Telehealth: Payer: Self-pay | Admitting: Family Medicine

## 2019-12-01 ENCOUNTER — Telehealth (INDEPENDENT_AMBULATORY_CARE_PROVIDER_SITE_OTHER): Payer: Managed Care, Other (non HMO) | Admitting: Student

## 2019-12-01 ENCOUNTER — Encounter: Payer: Self-pay | Admitting: Student

## 2019-12-01 ENCOUNTER — Other Ambulatory Visit: Payer: Self-pay | Admitting: Family Medicine

## 2019-12-01 VITALS — BP 122/72 | HR 110 | Ht 73.0 in

## 2019-12-01 DIAGNOSIS — R600 Localized edema: Secondary | ICD-10-CM

## 2019-12-01 DIAGNOSIS — I10 Essential (primary) hypertension: Secondary | ICD-10-CM

## 2019-12-01 DIAGNOSIS — K7030 Alcoholic cirrhosis of liver without ascites: Secondary | ICD-10-CM | POA: Diagnosis not present

## 2019-12-01 DIAGNOSIS — R609 Edema, unspecified: Secondary | ICD-10-CM

## 2019-12-01 DIAGNOSIS — Z79899 Other long term (current) drug therapy: Secondary | ICD-10-CM

## 2019-12-01 DIAGNOSIS — R002 Palpitations: Secondary | ICD-10-CM | POA: Diagnosis not present

## 2019-12-01 DIAGNOSIS — Z7189 Other specified counseling: Secondary | ICD-10-CM

## 2019-12-01 DIAGNOSIS — K7031 Alcoholic cirrhosis of liver with ascites: Secondary | ICD-10-CM

## 2019-12-01 MED ORDER — POTASSIUM CHLORIDE CRYS ER 20 MEQ PO TBCR: EXTENDED_RELEASE_TABLET | ORAL | 11 refills | Status: DC

## 2019-12-01 MED ORDER — FUROSEMIDE 40 MG PO TABS: ORAL_TABLET | ORAL | 11 refills | Status: DC

## 2019-12-01 NOTE — Progress Notes (Signed)
Virtual Visit via Telephone Note   This visit type was conducted due to national recommendations for restrictions regarding the COVID-19 Pandemic (e.g. social distancing) in an effort to limit this patient's exposure and mitigate transmission in our community.  Due to his co-morbid illnesses, this patient is at least at moderate risk for complications without adequate follow up.  This format is felt to be most appropriate for this patient at this time.  The patient did not have access to video technology/had technical difficulties with video requiring transitioning to audio format only (telephone).  All issues noted in this document were discussed and addressed.  No physical exam could be performed with this format.  Please refer to the patient's chart for his  consent to telehealth for [facility].   Date:  12/01/2019   ID:  Bradley Miles, DOB [birth year], MRN [id number]  Patient Location: Home Provider Location: Office  PCP:  Bradley Kirschner, MD  Cardiologist:  Bradley Sable, MD   Evaluation Performed:  Follow-Up Visit  Chief Complaint:  Hospital Follow-up  History of Present Illness:    Bradley Miles is a 53 y.o. male with past medical history of chronic liver disease secondary to alcohol use, HTN and palpitations who presents for a hospital follow-up telehealth visit.   He was last examined by Dr. Bronson Ing on 10/09/2019 and reported worsening lower extremity edema for the past 6-8 weeks. At the time of his visit, it was thought his edema might be secondary to portal HTN but dopplers were ordered to rule out a DVT. A 30-day monitor was also ordered in regards to his palpitations (not yet resulted by by review of Preventice website showed NSR with sinus tachycardia and occasional PAC's and PVC's). Dopplers were negative for a DVT.   In the interim, he was admitted to [facility] and later transferred to [facility] from 12/10 - 11/29/2019 for evaluation of coffee-ground  emesis. EGD was performed and showed new oozing of esophageal varices. He developed recurrent melena and hematochezia and underwent repeat EGD which showed esophageal varices and red blood along the cardia and fundus. Eventually underwent TIPS procedure on 11/24/2019 with embolization of the gastric variceal supply. He received a total of 11 units pRBC's during admission. He was started on Nadolol 40mg  daily by GI.   In talking with the patient and his wife today, he reports overall feeling fatigued since hospital discharge but denies any evidence of recurrent bleeding. He feels like his weight increased by 15+ pounds during admission and unfortunately daily weights were not obtained. It appears his weight was 261 lbs on 11/21/2019 but they do not have home scales at this time to weigh him. He says that he was previously wearing a size 36 pants and is now fitting into a 40/42. His wife is a Marine scientist and says that he has 1+ pitting edema up to his knees. He denies any specific orthopnea or PND. Does have baseline dyspnea on exertion. No chest pain or recent palpitations.   He has not consumed any alcohol since hospital discharge and says he plans to remain abstinent.  The patient does not have symptoms concerning for COVID-19 infection (fever, chills, cough, or new shortness of breath).    Past Medical History:  Diagnosis Date  . Anxiety   . Enlarged liver   . GERD (gastroesophageal reflux disease)   . Hypertension   . Palpitations   . PVC's (premature ventricular contractions)   . Shortness of breath   .  Varicose veins    Past Surgical History:  Procedure Laterality Date  . BIOPSY  02/02/2019   Procedure: BIOPSY;  Surgeon: Bradley Dolin, MD;  [facility];  Service: Endoscopy;;  gastric  . COLONOSCOPY WITH ESOPHAGOGASTRODUODENOSCOPY (EGD)  12/16/2012   internal hemorrhoids, colonic diverticulosis, benign polyps, screening in 2024. EGD with mild erosive reflux esophagitis, small hiatal  hernia, negative H.pylori  . cyst reomved     from spine  . ESOPHAGOGASTRODUODENOSCOPY  05/19/09   RMR: Geographic distal esophageal erosions consistent with severe erosive reflux esophagitis. schatzki's ring s/P dilation/small hiatal hernia otherwise normal stomach  . ESOPHAGOGASTRODUODENOSCOPY (EGD) WITH PROPOFOL N/A 02/02/2019   Dr. Gala Miles: retained gastric contents. portal HTN gastropathy  . ESOPHAGOGASTRODUODENOSCOPY (EGD) WITH PROPOFOL N/A 11/20/2019   Procedure: ESOPHAGOGASTRODUODENOSCOPY (EGD) WITH PROPOFOL;  Surgeon: Bradley Dolin, MD;  [facility];  Service: Endoscopy;  Laterality: N/A;  . ESOPHAGOGASTRODUODENOSCOPY (EGD) WITH PROPOFOL N/A 11/24/2019   Procedure: ESOPHAGOGASTRODUODENOSCOPY (EGD) WITH PROPOFOL;  Surgeon: Bradley Juniper, MD;  Location: [place];  Service: Gastroenterology;  Laterality: N/A;  . IR ANGIOGRAM SELECTIVE EACH ADDITIONAL VESSEL  11/22/2019  . IR ANGIOGRAM SELECTIVE EACH ADDITIONAL VESSEL  11/24/2019  . IR ANGIOGRAM SELECTIVE EACH ADDITIONAL VESSEL  11/24/2019  . IR ANGIOGRAM SELECTIVE EACH ADDITIONAL VESSEL  11/24/2019  . IR EMBO ART  VEN HEMORR LYMPH EXTRAV  INC GUIDE ROADMAPPING  11/22/2019  . IR EMBO ART  VEN HEMORR LYMPH EXTRAV  INC GUIDE ROADMAPPING  11/24/2019  . IR TIPS  11/24/2019  . IR US GUIDE VASC ACCESS RIGHT  11/22/2019  . IR VENOGRAM RENAL UNI LEFT  11/22/2019  . RADIOLOGY WITH ANESTHESIA N/A 11/24/2019   Procedure: IR WITH ANESTHESIA;  Surgeon: Radiologist, Medication, MD;  Location: [place];  Service: Radiology;  Laterality: N/A;  . TIPS PROCEDURE N/A 11/22/2019   Procedure: BRTO/TRANS-JUGULAR INTRAHEPATIC PORTAL SHUNT (TIPS);  Surgeon: Bradley Mckusick, DO;  Location: [place];  Service: Anesthesiology;  Laterality: N/A;  . WISDOM TOOTH EXTRACTION       No outpatient medications have been marked as taking for the 12/01/19 encounter (Appointment) with Bradley Heritage, PA-C.     Allergies:   Patient has no known allergies.    Social History   Tobacco Use  . Smoking status: Current Every Day Smoker    Packs/day: 1.50    Years: 30.00    Pack years: 45.00    Types: Cigarettes    Start date: 12/24/1979  . Smokeless tobacco: Never Used  Substance Use Topics  . Alcohol use: Yes    Alcohol/week: 0.0 standard drinks    Comment: drinks 2 5th/daily  . Drug use: No     Family Hx: The patient's family history includes Cancer in his mother; Colon polyps (age of onset: 69) in his sister; Heart disease in his mother. There is no history of Liver disease.  ROS:   Please see the history of present illness.     All other systems reviewed and are negative.   Prior CV studies:   The following studies were reviewed today:  Echocardiogram: 05/2018 Study Conclusions  - Left ventricle: The cavity size was normal. Wall thickness was   increased in a pattern of moderate LVH. Systolic function was   normal. The estimated ejection fraction was in the range of 60%   to 65%. Wall motion was normal; there were no regional wall   motion abnormalities. The study is not technically sufficient to   allow evaluation of  LV diastolic function. - Aortic valve: Valve area (VTI): 2.67 cm^2. Valve area (Vmax): 2.6   cm^2. - Technically adequate study.  Labs/Other Tests and Data Reviewed:    EKG:  No ECG reviewed.  Recent Labs: 11/25/2019: Magnesium 1.4 11/29/2019: ALT 56; BUN 8; Creatinine, Ser 0.75; Hemoglobin 7.7; Platelets 95; Potassium 3.4; Sodium 138   Recent Lipid Panel Lab Results  Component Value Date/Time   CHOL 219 (H) 03/17/2014 08:01 AM   TRIG 221 (H) 03/17/2014 08:01 AM   HDL 51 03/17/2014 08:01 AM   CHOLHDL 4.3 03/17/2014 08:01 AM   LDLCALC 124 (H) 03/17/2014 08:01 AM    Wt Readings from Last 3 Encounters:  11/21/19 262 lb 5.6 oz (119 kg)  10/09/19 269 lb (122 kg)  08/19/19 260 lb 3.2 oz (118 kg)     Objective:    Vital Signs:  There were no vitals taken for this visit.   General: Pleasant  male sounding in NAD Psych: Normal affect. Neuro: Alert and oriented X 3.  Lungs:  Resp regular and unlabored while talking on the phone.   ASSESSMENT & PLAN:    1. Palpitations - He recently completed a 30-day event monitor but the official report is not yet available. By review of the Preventice website this showed sinus rhythm and sinus tachycardia with occasional PAC's and PVC's but no sustained arrhythmias. - recently switched from Acebutolol to Nadolol during his recent admission secondary to esophageal varices. He has not yet started Nadolol as this was not currently available at his pharmacy but plans to start today.  We reviewed that this can be further titrated in the future pending BP response. He has been sober for 12 days and was congratulated on this.   2. Lower Extremity Edema - His wife reports he has 1+ pitting edema on examination and he reports wearing a size 36 pounds prior to admission and is now having to wear a size 40. He received multiple transfusions as outlined above and received 1-2 doses of Lasix per the report. Will provide Rx for Lasix 40 mg daily to take for 5 days then as needed for edema. Will also add K+ supplementation as K+ was 3.4 at the time of hospital discharge. Recheck BMET in 1 week.   3. Liver Cirrhosis - recently admitted for recurrent melena and hematochezia and underwent repeat EGD which showed esophageal varices and red blood along the cardia and fundus. Now s/p TIPS procedure on 11/24/2019 with embolization of the gastric variceal supply. He received a total of 11 units pRBC's during admission.  - encouraged to keep scheduled outpatient follow-up with GI.   4. HTN - BP was well-controlled at 122/72 on most recent check. Recommended starting Nadolol 40mg  as outlined above.    COVID-19 Education: The signs and symptoms of COVID-19 were discussed with the patient and how to seek care for testing (follow up with PCP or arrange E-visit).  The  importance of social distancing was discussed today.  Time:   Today, I have spent 23 minutes with the patient with telehealth technology discussing the above problems.     Medication Adjustments/Labs and Tests Ordered: Current medicines are reviewed at length with the patient today.  Concerns regarding medicines are outlined above.   Tests Ordered: No orders of the defined types were placed in this encounter.   Medication Changes: No orders of the defined types were placed in this encounter.   Follow Up: Keep scheduled follow-up for 01/2020  Signed, Fransisco Hertz  Delano Metz  12/01/2019 6:34 AM     [facility]

## 2019-12-01 NOTE — Patient Instructions (Signed)
Medication Instructions:  Your physician has recommended you make the following change in your medication:  Start Lasix 40 mg Daily For 5 Days then Take Daily only as needed  For Edema  Start KDur 20 mEq Take on the Days that you take Lasix    Labwork: Your physician recommends that you return for lab work in: 1 Week    Testing/Procedures: None   Follow-Up: Your physician recommends that you schedule a follow-up appointment in: As scheduled    Any Other Special Instructions Will Be Listed Below (If Applicable).     If you need a refill on your cardiac medications before your next appointment, please call your pharmacy.  Thank you for choosing [facility]!

## 2019-12-01 NOTE — Telephone Encounter (Signed)
[facility] faxed over form that needs to be completed by physician and sent back along with recent office visit. Please review and fill in highlighted areas. In your folder for review.

## 2019-12-02 ENCOUNTER — Other Ambulatory Visit: Payer: Self-pay | Admitting: *Deleted

## 2019-12-02 ENCOUNTER — Other Ambulatory Visit: Payer: Self-pay | Admitting: Interventional Radiology

## 2019-12-02 ENCOUNTER — Telehealth: Payer: Self-pay | Admitting: Licensed Clinical Social Worker

## 2019-12-02 DIAGNOSIS — I8501 Esophageal varices with bleeding: Secondary | ICD-10-CM

## 2019-12-02 NOTE — Telephone Encounter (Addendum)
CSW referred to assist patient with obtaining a scale. CSW contacted patient to inform scale will be delivered to home. Patient grateful for support and assistance. CSW available as needed. Jackie Daionna Crossland, LCSW, CCSW-MCS [phone]  

## 2019-12-09 ENCOUNTER — Telehealth: Payer: Self-pay | Admitting: Internal Medicine

## 2019-12-09 ENCOUNTER — Telehealth: Payer: Self-pay

## 2019-12-09 NOTE — Telephone Encounter (Signed)
Called pt to inform pt that we are canceling his appt.   Provider tested positive for Covid. Please r/s

## 2019-12-09 NOTE — Telephone Encounter (Signed)
Patient wife called and said that she will be faxing more disability paperworkl that needs to be filled out for him

## 2019-12-09 NOTE — Telephone Encounter (Signed)
Noted  

## 2019-12-10 ENCOUNTER — Other Ambulatory Visit: Payer: Self-pay | Admitting: Family Medicine

## 2019-12-14 ENCOUNTER — Other Ambulatory Visit: Payer: Self-pay

## 2019-12-14 ENCOUNTER — Ambulatory Visit (INDEPENDENT_AMBULATORY_CARE_PROVIDER_SITE_OTHER): Payer: Managed Care, Other (non HMO) | Admitting: Family Medicine

## 2019-12-14 DIAGNOSIS — R7989 Other specified abnormal findings of blood chemistry: Secondary | ICD-10-CM | POA: Diagnosis not present

## 2019-12-14 DIAGNOSIS — Z79899 Other long term (current) drug therapy: Secondary | ICD-10-CM

## 2019-12-14 DIAGNOSIS — R748 Abnormal levels of other serum enzymes: Secondary | ICD-10-CM

## 2019-12-14 DIAGNOSIS — F10282 Alcohol dependence with alcohol-induced sleep disorder: Secondary | ICD-10-CM

## 2019-12-14 NOTE — Progress Notes (Signed)
   Subjective:    Patient ID: Bradley Miles, male    DOB: 1966-09-22, 54 y.o.   MRN: [id number]  HPIhospitalization follow up. Wife states he sleeps all day. Has no energy.   Virtual Visit via Video Note  I connected with Bradley Miles on 12/14/19 at  1:10 PM EST by a video enabled telemedicine application and verified that I am speaking with the correct person using two identifiers.  Location: Patient: home Provider: office   I discussed the limitations of evaluation and management by telemedicine and the availability of in person appointments. The patient expressed understanding and agreed to proceed.  History of Present Illness:    Observations/Objective:   Assessment and Plan:   Follow Up Instructions:    I discussed the assessment and treatment plan with the patient. The patient was provided an opportunity to ask questions and all were answered. The patient agreed with the plan and demonstrated an understanding of the instructions.   The patient was advised to call back or seek an in-person evaluation if the symptoms worsen or if the condition fails to improve as anticipated.  I provided 25 minutes of non-face-to-face time during this encounter.  Patient recently hospitalized for vomiting of blood.  Work-up revealed bleeding from esophageal varices.  Patient has known cirrhosis with esophageal varices and known history of chronic alcohol abuse.  Patient states currently not drinking thankfully.  Patient is known to drink a very excessive amount of hard liquor daily.  Patient having sedation during the daytime.  He is on a couple medications which can contribute to this.  He also notes some fatigue.  Also appropriately his wife is concerned about his ammonia level which is been an issue in the past      Review of Systems No headache no chest pain or shortness of breath    Objective:   Physical Exam  Virtual      Assessment & Plan:  Impression status  post GI bleed with anemia developing subsequently.  Will follow up with a CBC further recommendations based on results  2.  Excessive sedation.  Concerning with patient's history of elevated ammonia.  We will do an urgent ammonia level along with renal and liver function.  Further recommendations based on results.  Could also be fatigue along with normal side effects from current meds  3.  Chronic alcohol abuse.  Patient has been involved in the past with inpatient programs, also outpatient programs, encouraged to maintain connection with outpatient AA counseling  Further recommendations based on results

## 2019-12-16 ENCOUNTER — Ambulatory Visit: Payer: Managed Care, Other (non HMO) | Admitting: Neurology

## 2019-12-17 LAB — CBC WITH DIFFERENTIAL/PLATELET
Basophils Absolute: 0 10*3/uL (ref 0.0–0.2)
Basos: 1 %
EOS (ABSOLUTE): 0.4 10*3/uL (ref 0.0–0.4)
Eos: 7 %
Hematocrit: 32 % — ABNORMAL LOW (ref 37.5–51.0)
Hemoglobin: 10.5 g/dL — ABNORMAL LOW (ref 13.0–17.7)
Immature Grans (Abs): 0 10*3/uL (ref 0.0–0.1)
Immature Granulocytes: 0 %
Lymphocytes Absolute: 0.7 10*3/uL (ref 0.7–3.1)
Lymphs: 12 %
MCH: 32.3 pg (ref 26.6–33.0)
MCHC: 32.8 g/dL (ref 31.5–35.7)
MCV: 99 fL — ABNORMAL HIGH (ref 79–97)
Monocytes Absolute: 0.6 10*3/uL (ref 0.1–0.9)
Monocytes: 10 %
Neutrophils Absolute: 4 10*3/uL (ref 1.4–7.0)
Neutrophils: 70 %
Platelets: 129 10*3/uL — ABNORMAL LOW (ref 150–450)
RBC: 3.25 x10E6/uL — ABNORMAL LOW (ref 4.14–5.80)
RDW: 15.3 % (ref 11.6–15.4)
WBC: 5.7 10*3/uL (ref 3.4–10.8)

## 2019-12-17 LAB — BASIC METABOLIC PANEL
BUN/Creatinine Ratio: 10 (ref 9–20)
BUN: 6 mg/dL (ref 6–24)
CO2: 24 mmol/L (ref 20–29)
Calcium: 8.2 mg/dL — ABNORMAL LOW (ref 8.7–10.2)
Chloride: 101 mmol/L (ref 96–106)
Creatinine, Ser: 0.61 mg/dL — ABNORMAL LOW (ref 0.76–1.27)
GFR calc Af Amer: 134 mL/min/{1.73_m2} (ref >59)
GFR calc non Af Amer: 116 mL/min/{1.73_m2} (ref >59)
Glucose: 106 mg/dL — ABNORMAL HIGH (ref 65–99)
Potassium: 4.4 mmol/L (ref 3.5–5.2)
Sodium: 137 mmol/L (ref 134–144)

## 2019-12-17 LAB — AMMONIA: Ammonia: 59 ug/dL (ref 40–200)

## 2019-12-17 LAB — HEPATIC FUNCTION PANEL
ALT: 21 IU/L (ref 0–44)
AST: 60 IU/L — ABNORMAL HIGH (ref 0–40)
Albumin: 2.3 g/dL — ABNORMAL LOW (ref 3.8–4.9)
Alkaline Phosphatase: 264 IU/L — ABNORMAL HIGH (ref 39–117)
Bilirubin Total: 2.1 mg/dL — ABNORMAL HIGH (ref 0.0–1.2)
Bilirubin, Direct: 1.36 mg/dL — ABNORMAL HIGH (ref 0.00–0.40)
Total Protein: 6.5 g/dL (ref 6.0–8.5)

## 2019-12-17 NOTE — Progress Notes (Signed)
[place] 1/7

## 2019-12-21 ENCOUNTER — Telehealth: Payer: Self-pay | Admitting: Internal Medicine

## 2019-12-21 NOTE — Telephone Encounter (Signed)
Patient wife called and needs you to call her, [phone].  Another one of his doctors has put him on Tramadol and it says to notify the doctor if you have liver disease.  Patient needs to know if it is ok for him to take

## 2019-12-22 ENCOUNTER — Other Ambulatory Visit: Payer: Self-pay

## 2019-12-22 ENCOUNTER — Ambulatory Visit: Payer: Managed Care, Other (non HMO) | Admitting: Neurology

## 2019-12-22 ENCOUNTER — Encounter: Payer: Self-pay | Admitting: Neurology

## 2019-12-22 VITALS — BP 126/78 | HR 93 | Temp 96.9°F | Ht 73.0 in | Wt 250.0 lb

## 2019-12-22 DIAGNOSIS — R054 Cough syncope: Secondary | ICD-10-CM

## 2019-12-22 DIAGNOSIS — R55 Syncope and collapse: Secondary | ICD-10-CM | POA: Insufficient documentation

## 2019-12-22 DIAGNOSIS — R05 Cough: Secondary | ICD-10-CM

## 2019-12-22 DIAGNOSIS — G622 Polyneuropathy due to other toxic agents: Secondary | ICD-10-CM | POA: Diagnosis not present

## 2019-12-22 HISTORY — DX: Cough syncope: R05.4

## 2019-12-22 HISTORY — DX: Cough: R05

## 2019-12-22 HISTORY — DX: Syncope and collapse: R55

## 2019-12-22 MED ORDER — GABAPENTIN 600 MG PO TABS: 600.0000 mg | ORAL_TABLET | Freq: Three times a day (TID) | ORAL | 1 refills | Status: DC

## 2019-12-22 NOTE — Telephone Encounter (Signed)
Spoke to pts spouse. Routing to EG for review.

## 2019-12-22 NOTE — Progress Notes (Signed)
Reason for visit: Peripheral neuropathy, tussive syncope, encephalopathy  Referring physician: Dr. Gar Gibbon Bradley Miles is a 54 y.o. male  History of present illness:  Bradley Miles is a 54 year old white male with a history of alcohol abuse.  The patient has had documented problems with hyperammonemia associated with encephalopathy, his ammonia level has been as high as 183 in the past.  He has been treated with Xifaxan and lactulose.  He has continued to drink until 18 November 2019.  He was admitted to the hospital on 19 November 2019 with recurring vomiting and hematemesis.  The patient required a portacaval shunt, he did have some increased confusion around the time of the GI bleed.  An ammonia level on 28 November 2019 was 51.  Upper limits of normal for this lab is 35.  The patient has not had any more confusion since the hospitalization.  He believes that his cognitive functioning is normal.  He reports a 6-year history of some problems with numbness of the feet and some numbness of the hands as well.  He reports a tingling sensation in the feet that is somewhat worse when he is up on his feet, he feels better lying down.  The patient denies any weakness of the extremities, he denies any back pain or pain down the legs.  The patient mainly has sensory alteration to just above the ankles.  He denies issues controlling the bowels or the bladder.  He denies any severe balance problems, he has not had any falls.  He also reports a history of tussive syncope over the last 6 or 7 years.  He has had less than 10 such events over that timeframe.  The last such event occurred 4 or 5 months ago.  All episodes of syncope have been associated with smoking, he smokes 1/2 pack cigarettes daily.  He will get into an episode of severe prolonged coughing and then he may have a brief episode of syncope.  The patient is sent to this office for an evaluation.  Past Medical History:  Diagnosis Date  . Anxiety     . Enlarged liver   . GERD (gastroesophageal reflux disease)   . Hypertension   . Palpitations   . PVC's (premature ventricular contractions)   . Shortness of breath   . Varicose veins     Past Surgical History:  Procedure Laterality Date  . BIOPSY  02/02/2019   Procedure: BIOPSY;  Surgeon: Daneil Dolin, MD;  [facility];  Service: Endoscopy;;  gastric  . COLONOSCOPY WITH ESOPHAGOGASTRODUODENOSCOPY (EGD)  12/16/2012   internal hemorrhoids, colonic diverticulosis, benign polyps, screening in 2024. EGD with mild erosive reflux esophagitis, small hiatal hernia, negative H.pylori  . cyst reomved     from spine  . ESOPHAGOGASTRODUODENOSCOPY  05/19/09   RMR: Geographic distal esophageal erosions consistent with severe erosive reflux esophagitis. schatzki's ring s/P dilation/small hiatal hernia otherwise normal stomach  . ESOPHAGOGASTRODUODENOSCOPY (EGD) WITH PROPOFOL N/A 02/02/2019   Dr. Gala Romney: retained gastric contents. portal HTN gastropathy  . ESOPHAGOGASTRODUODENOSCOPY (EGD) WITH PROPOFOL N/A 11/20/2019   Procedure: ESOPHAGOGASTRODUODENOSCOPY (EGD) WITH PROPOFOL;  Surgeon: Daneil Dolin, MD;  [facility];  Service: Endoscopy;  Laterality: N/A;  . ESOPHAGOGASTRODUODENOSCOPY (EGD) WITH PROPOFOL N/A 11/24/2019   Procedure: ESOPHAGOGASTRODUODENOSCOPY (EGD) WITH PROPOFOL;  Surgeon: Ronnette Juniper, MD;  Location: [place];  Service: Gastroenterology;  Laterality: N/A;  . IR ANGIOGRAM SELECTIVE EACH ADDITIONAL VESSEL  11/22/2019  . [place]  ADDITIONAL VESSEL  11/24/2019  . IR ANGIOGRAM SELECTIVE EACH ADDITIONAL VESSEL  11/24/2019  . IR ANGIOGRAM SELECTIVE EACH ADDITIONAL VESSEL  11/24/2019  . IR EMBO ART  VEN HEMORR LYMPH EXTRAV  INC GUIDE ROADMAPPING  11/22/2019  . IR EMBO ART  VEN HEMORR LYMPH EXTRAV  INC GUIDE ROADMAPPING  11/24/2019  . IR TIPS  11/24/2019  . IR US GUIDE VASC ACCESS RIGHT  11/22/2019  . IR VENOGRAM RENAL UNI LEFT  11/22/2019  .  RADIOLOGY WITH ANESTHESIA N/A 11/24/2019   Procedure: IR WITH ANESTHESIA;  Surgeon: Radiologist, Medication, MD;  Location: [place];  Service: Radiology;  Laterality: N/A;  . TIPS PROCEDURE N/A 11/22/2019   Procedure: BRTO/TRANS-JUGULAR INTRAHEPATIC PORTAL SHUNT (TIPS);  Surgeon: Corrie Mckusick, DO;  Location: [place];  Service: Anesthesiology;  Laterality: N/A;  . WISDOM TOOTH EXTRACTION      Family History  Problem Relation Age of Onset  . Cancer Mother        ? etiology  . Heart disease Mother   . Colon polyps Sister 87       two sisters  . Brain cancer Father   . Liver disease Neg Hx     Social history:  reports that he has been smoking cigarettes. He started smoking about 40 years ago. He has a 45.00 pack-year smoking history. He has never used smokeless tobacco. He reports current alcohol use. He reports that he does not use drugs.  Medications:  Prior to Admission medications   Medication Sig Start Date End Date Taking? Authorizing Provider  allopurinol (ZYLOPRIM) 100 MG tablet TAKE 1 TABLET BY MOUTH EVERY DAY 12/10/19  Yes [name], MD  cholecalciferol (VITAMIN D3) 25 MCG (1000 UT) tablet Take 1,000 Units by mouth daily.   Yes [provider]  DEXILANT 60 MG capsule TAKE 1 CAPSULE BY MOUTH EVERY DAY 12/02/19  Yes Luking, Scott A, MD  famotidine (PEPCID) 10 MG tablet Take 10 mg by mouth as needed for heartburn or indigestion.   Yes [provider]  ferrous sulfate 325 (65 FE) MG EC tablet Take 1 tablet (325 mg total) by mouth 2 (two) times daily. 11/29/19 12/29/19 Yes Elgergawy, Silver Huguenin, MD  folic acid (FOLVITE) 914 MCG tablet Take 400 mcg by mouth daily.   Yes [provider]  furosemide (LASIX) 40 MG tablet Take Daily for 5 Days, Then Take Daily Only As Needed for Edema 12/01/19  Yes Strader, Lyons Switch, PA-C  gabapentin (NEURONTIN) 300 MG capsule TAKE 1 CAPSULE BY MOUTH FOUR TIMES DAILY 12/10/19  Yes [name], MD  lactulose,  encephalopathy, (CHRONULAC) 10 GM/15ML SOLN Take 15-30cc three times daily to Titrate for 3-4 soft bowel movements a day 11/29/19  Yes Elgergawy, Silver Huguenin, MD  LORazepam (ATIVAN) 1 MG tablet TAKE 1/2 TABLET BY MOUTH TWICE DAILY AS NEEDED FOR ANXIETY 12/02/19  Yes Luking, Elayne Snare, MD  Multiple Vitamin (MULTIVITAMIN WITH MINERALS) TABS tablet Take 1 tablet by mouth daily. 11/30/19  Yes Elgergawy, Silver Huguenin, MD  nadolol (CORGARD) 40 MG tablet Take 1 tablet (40 mg total) by mouth daily. 11/30/19  Yes Elgergawy, Silver Huguenin, MD  ondansetron (ZOFRAN-ODT) 4 MG disintegrating tablet DISSOLVE 1 TABLET ON THE TONGUE EVERY 8 HOURS AS NEEDED FOR NAUSEA 08/19/19  Yes Carlis Stable, NP  PARoxetine (PAXIL) 20 MG tablet TAKE 1 TABLET(20 MG) BY MOUTH DAILY 09/22/19  Yes [name], MD  potassium chloride SA (KLOR-CON) 20 MEQ tablet Take Daily for 5 Days  and then take on the days that you take Lasix 12/01/19  Yes Strader, Wilcox, PA-C  rifaximin (XIFAXAN) 550 MG TABS tablet Take 1 tablet (550 mg total) by mouth 2 (two) times daily. 11/29/19  Yes Elgergawy, Silver Huguenin, MD  sucralfate (CARAFATE) 1 g tablet TAKE 1 TABLET BY MOUTH BEFORE MEALS 11/27/19  Yes [name], MD  thiamine 100 MG tablet Take 1 tablet (100 mg total) by mouth daily. 11/30/19  Yes Elgergawy, Silver Huguenin, MD  vitamin B-12 (CYANOCOBALAMIN) 1000 MCG tablet Take 1,000 mcg by mouth daily.   Yes [provider]     No Known Allergies  ROS:  Out of a complete 14 system review of symptoms, the patient complains only of the following symptoms, and all other reviewed systems are negative.  Foot numbness and tingling Blackouts Cough  Blood pressure 126/78, pulse 93, temperature (!) 96.9 F (36.1 C), height 6\' 1"  (1.854 m), weight 250 lb (113.4 kg).  Physical Exam  General: The patient is alert and cooperative at the time of the examination.  The patient is moderately obese.  Eyes: Pupils are equal, round, and reactive to light.  Discs are flat bilaterally.  Neck: The neck is supple, no carotid bruits are noted.  Respiratory: The respiratory examination is clear.  Cardiovascular: The cardiovascular examination reveals a regular rate and rhythm, no obvious murmurs or rubs are noted.  Skin: Extremities are with 1+ edema at the ankles is noted bilaterally.  Neurologic Exam  Mental status: The patient is alert and oriented x 3 at the time of the examination. The patient has apparent normal recent and remote memory, with an apparently normal attention span and concentration ability.  Cranial nerves: Facial symmetry is present. There is good sensation of the face to pinprick and soft touch bilaterally. The strength of the facial muscles and the muscles to head turning and shoulder shrug are normal bilaterally. Speech is well enunciated, no aphasia or dysarthria is noted. Extraocular movements are full. Visual fields are full. The tongue is midline, and the patient has symmetric elevation of the soft palate. No obvious hearing deficits are noted.  Motor: The motor testing reveals 5 over 5 strength of all 4 extremities. Good symmetric motor tone is noted throughout.  Sensory: Sensory testing is intact to pinprick, soft touch, vibration sensation, and position sense on the upper extremities.  With the lower extremities, there is a stocking pattern pinprick sensory deficit two thirds way up the legs bilaterally.  Significant impairment of vibration sensation is seen in both feet, position sensation is slightly decreased in the left foot, relatively normal on the right.  No evidence of extinction is noted.  Coordination: Cerebellar testing reveals good finger-nose-finger and heel-to-shin bilaterally.  Gait and station: Gait is normal. Tandem gait is slightly unsteady. Romberg is negative. No drift is seen.  Reflexes: Deep tendon reflexes are symmetric, but are depressed bilaterally. Toes are downgoing  bilaterally.   Assessment/Plan:  1.  Peripheral neuropathy  2.  History of alcohol abuse  3.  Tussive syncope  4.  Hyperammonemia associated with mild encephalopathy  The patient gives a family history of what sounds like glioblastoma in a paternal grandfather and father.  He is concerned about this issue.  The patient gives a history consistent with tussive syncope, cessation of smoking will prevent such episodes.  The patient will be set up for blood work today, he will have nerve conductions on both legs and EMG on one leg.  The  gabapentin will be increased to 600 mg 3 times daily.  The patient will follow up through this office in about 4 months.  Jill Alexanders MD 12/22/2019 10:58 AM  [facility] [address] [place], [place] [address]  Phone [phone] Fax [phone]

## 2019-12-23 NOTE — Telephone Encounter (Signed)
Based on his most recent labs (improved compared to his hospital admission) he appears to have "moderate" liver impairment which does not carry a dosage adjustment. Only needs dose adjusting for "severe" impairment. He should be ok, but they should monitor his liver labs while he is on it for any change. If he notices and worsening yellowing of skin/eyes, darkened/brown urine stop taking it and call the MD who prescribed it.  Let me know if any questions.

## 2019-12-23 NOTE — Telephone Encounter (Signed)
FYI Spoke with pts spouse. She understands what she needs to look out for in pt while taking Tramadol for a short period of time. Spouse did want EG to know that pt has lost 33 pounds in 26 days and is eat 3 full meals a day. They would like to discuss the weight loss at pts apt Monday 12/28/2019. Pt isn't drinking alcohol at this time.

## 2019-12-24 NOTE — Telephone Encounter (Signed)
Noted  

## 2019-12-24 NOTE — Telephone Encounter (Signed)
Noted, will discuss then.

## 2019-12-28 ENCOUNTER — Other Ambulatory Visit: Payer: Self-pay

## 2019-12-28 ENCOUNTER — Encounter: Payer: Self-pay | Admitting: Nurse Practitioner

## 2019-12-28 ENCOUNTER — Ambulatory Visit (INDEPENDENT_AMBULATORY_CARE_PROVIDER_SITE_OTHER): Payer: Managed Care, Other (non HMO) | Admitting: Nurse Practitioner

## 2019-12-28 ENCOUNTER — Telehealth: Payer: Self-pay

## 2019-12-28 VITALS — BP 113/66 | HR 89 | Temp 97.0°F | Ht 73.0 in | Wt 251.2 lb

## 2019-12-28 DIAGNOSIS — K7031 Alcoholic cirrhosis of liver with ascites: Secondary | ICD-10-CM

## 2019-12-28 DIAGNOSIS — D62 Acute posthemorrhagic anemia: Secondary | ICD-10-CM | POA: Diagnosis not present

## 2019-12-28 DIAGNOSIS — F1029 Alcohol dependence with unspecified alcohol-induced disorder: Secondary | ICD-10-CM

## 2019-12-28 LAB — MULTIPLE MYELOMA PANEL, SERUM
Albumin SerPl Elph-Mcnc: 2.1 g/dL — ABNORMAL LOW (ref 2.9–4.4)
Albumin/Glob SerPl: 0.5 — ABNORMAL LOW (ref 0.7–1.7)
Alpha 1: 0.4 g/dL (ref 0.0–0.4)
Alpha2 Glob SerPl Elph-Mcnc: 0.5 g/dL (ref 0.4–1.0)
B-Globulin SerPl Elph-Mcnc: 1.2 g/dL (ref 0.7–1.3)
Gamma Glob SerPl Elph-Mcnc: 2.4 g/dL — ABNORMAL HIGH (ref 0.4–1.8)
Globulin, Total: 4.5 g/dL — ABNORMAL HIGH (ref 2.2–3.9)
IgA/Immunoglobulin A, Serum: 714 mg/dL — ABNORMAL HIGH (ref 90–386)
IgG (Immunoglobin G), Serum: 2407 mg/dL — ABNORMAL HIGH (ref 603–1613)
IgM (Immunoglobulin M), Srm: 214 mg/dL — ABNORMAL HIGH (ref 20–172)
Total Protein: 6.6 g/dL (ref 6.0–8.5)

## 2019-12-28 LAB — ANA W/REFLEX: Anti Nuclear Antibody (ANA): NEGATIVE

## 2019-12-28 LAB — SEDIMENTATION RATE: Sed Rate: 72 mm/hr — ABNORMAL HIGH (ref 0–30)

## 2019-12-28 LAB — B. BURGDORFI ANTIBODIES: Lyme IgG/IgM Ab: <0.91 {ISR} (ref 0.00–0.90)

## 2019-12-28 LAB — ANGIOTENSIN CONVERTING ENZYME: Angio Convert Enzyme: 66 U/L (ref 14–82)

## 2019-12-28 LAB — VITAMIN B12: Vitamin B-12: 1214 pg/mL (ref 232–1245)

## 2019-12-28 LAB — COPPER, SERUM: Copper: 116 ug/dL (ref 69–132)

## 2019-12-28 NOTE — Progress Notes (Signed)
Cc'ed to pcp °

## 2019-12-28 NOTE — Telephone Encounter (Signed)
Pt's spouse called back and wants to know the date that EG thought pt should return to work. Spouse states this discussed at pts apt today 12/28/2019 but she didn't know the date. [phone]

## 2019-12-28 NOTE — Progress Notes (Signed)
Referring Provider: [name], MD Primary Care Physician:  [name], MD Primary GI:  Dr. Gala Romney  Chief Complaint  Patient presents with  . Cirrhosis    stopped drinking 12/9    HPI:   Bradley Miles is a 54 y.o. male who presents for follow-up on alcoholic cirrhosis, hepatic encephalopathy, right upper quadrant pain.  Chronic history of alcoholic cirrhosis and hepatic encephalopathy with alcohol dependence.  Recent worsening of mental status intermittently for which she was started on lactulose 15 cc 3 times a day.  He felt less groggy and confused after that.  Previously out of work because he was told not to drive with his current conditions.  History of intermittent alcohol cessation with inpatient versus outpatient counseling.  Right upper quadrant ultrasound in August 2020 with hepatic steatosis and hepatosplenomegaly.  No focal lesions.  At his last visit they stated no EtOH in about 3 weeks, gaining weight subsequently.  Occasionally forgets the middle dose of lactulose and his girlfriend will try calling him and keeping him on the phone during the day to see if she can improve this.  Intermittent forgetfulness and confusion.  Even with twice daily lactulose he is having 2-3 stools a day.  Some intermittent right upper quadrant discomfort and epigastric discomfort on Dexilant and as needed famotidine.  Is trying to reestablish with a counselor that he previously saw had a good relationship with.  Also noted improvement in swelling.  Recommended complete previously ordered labs, Zofran for nausea, printed and verbal education related to 2 g sodium diet, use of "my fitness pal" to track foods and sodium intake, strict avoidance of alcohol, lactulose, samples of Xifaxan for HEP, use Pepcid over-the-counter or Tums as needed for GERD flares continue Dexilant.  Provided information on the National alcohol helpline for additional alcohol cessation resources, established with  previously identified alcohol counselor, follow-up in 2 to 4 weeks.  The patient was a no-show for his follow-up visits on 09/10/2019 and 11/18/2019.  The patient was admitted to the hospital from 11/19/2019 through 11/29/2019 for initial complaints of coffee-ground emesis with 15 episodes of vomiting.  Off medications due to noncompliance.  He was back to drinking a fifth of liquor daily.  Gastric occult blood positive, hemoglobin stable in the ED.  He was started on octreotide and Protonix.  He underwent endoscopy at any time which found erosive gastritis and new oozing esophageal varices status post clipping.  On 1212 he developed large-volume bright red hematemesis and was transferred to the ICU with BP responding to volume resuscitation.  Multiple episodes of maroon stool resulted in transfer to [facility], ICU for CTA and IR consult.  EGD was again performed which found a diminutive grade 1 varices in the lower third of the esophagus, red blood in the cardia and fundus, fresh blood clots with ongoing active bleeding despite suctioning and lavage.  Status post TIPS with embolization of gastric variceal supply.  He received a total of 11 units of PRBC, 3 units of FFP, and 1 dose of vitamin K. (Status post B RTO on 1213 and TIPS with embolization on 1215).  He was discharged on lactulose and rifaximin, iron supplement with hemoglobin stable at 7.7 on discharge.  Today he is accompanied by his Wife.  Today he states he's doing somewhat better. No ETOH since 11/18/2019. He's been having a lot of fatigue after discharge; is taking about 2 naps during the day. Does have to waken 3-4 times  a day to urinate and we discussed fluid management solutions. Swelling is much improved. Is taking lactulose once to twice a day and having at least 3 stools a day; also on Xifaxan. Has some RUQ discomfort which is intermittent. Denies N/V, hematochezia, melena, fever, chills. Has had some unintentional weight loss of about 25  lbs around hospitalization. Eats "like a horse." Denies chest pain, dyspnea, dizziness, lightheadedness, syncope, near syncope. Denies any other upper or lower GI symptoms. Denies chest pain, dyspnea, dizziness, lightheadedness, syncope, near syncope. Denies any other upper or lower GI symptoms.  **Note: a total of 45 minutes spent on this encounter.**  Past Medical History:  Diagnosis Date  . Anxiety   . Enlarged liver   . GERD (gastroesophageal reflux disease)   . Hypertension   . Palpitations   . PVC's (premature ventricular contractions)   . Shortness of breath   . Tussive syncope 12/22/2019  . Varicose veins     Past Surgical History:  Procedure Laterality Date  . BIOPSY  02/02/2019   Procedure: BIOPSY;  Surgeon: Daneil Dolin, MD;  [facility];  Service: Endoscopy;;  gastric  . COLONOSCOPY WITH ESOPHAGOGASTRODUODENOSCOPY (EGD)  12/16/2012   internal hemorrhoids, colonic diverticulosis, benign polyps, screening in 2024. EGD with mild erosive reflux esophagitis, small hiatal hernia, negative H.pylori  . cyst reomved     from spine  . ESOPHAGOGASTRODUODENOSCOPY  05/19/09   RMR: Geographic distal esophageal erosions consistent with severe erosive reflux esophagitis. schatzki's ring s/P dilation/small hiatal hernia otherwise normal stomach  . ESOPHAGOGASTRODUODENOSCOPY (EGD) WITH PROPOFOL N/A 02/02/2019   Dr. Gala Romney: retained gastric contents. portal HTN gastropathy  . ESOPHAGOGASTRODUODENOSCOPY (EGD) WITH PROPOFOL N/A 11/20/2019   Procedure: ESOPHAGOGASTRODUODENOSCOPY (EGD) WITH PROPOFOL;  Surgeon: Daneil Dolin, MD;  [facility];  Service: Endoscopy;  Laterality: N/A;  . ESOPHAGOGASTRODUODENOSCOPY (EGD) WITH PROPOFOL N/A 11/24/2019   Procedure: ESOPHAGOGASTRODUODENOSCOPY (EGD) WITH PROPOFOL;  Surgeon: Ronnette Juniper, MD;  Location: [facility];  Service: Gastroenterology;  Laterality: N/A;  . IR ANGIOGRAM SELECTIVE EACH ADDITIONAL VESSEL  11/22/2019  . IR  ANGIOGRAM SELECTIVE EACH ADDITIONAL VESSEL  11/24/2019  . IR ANGIOGRAM SELECTIVE EACH ADDITIONAL VESSEL  11/24/2019  . IR ANGIOGRAM SELECTIVE EACH ADDITIONAL VESSEL  11/24/2019  . IR EMBO ART  VEN HEMORR LYMPH EXTRAV  INC GUIDE ROADMAPPING  11/22/2019  . IR EMBO ART  VEN HEMORR LYMPH EXTRAV  INC GUIDE ROADMAPPING  11/24/2019  . IR TIPS  11/24/2019  . IR US GUIDE VASC ACCESS RIGHT  11/22/2019  . IR VENOGRAM RENAL UNI LEFT  11/22/2019  . RADIOLOGY WITH ANESTHESIA N/A 11/24/2019   Procedure: IR WITH ANESTHESIA;  Surgeon: Radiologist, Medication, MD;  Location: [facility];  Service: Radiology;  Laterality: N/A;  . TIPS PROCEDURE N/A 11/22/2019   Procedure: BRTO/TRANS-JUGULAR INTRAHEPATIC PORTAL SHUNT (TIPS);  Surgeon: Corrie Mckusick, DO;  Location: [facility];  Service: Anesthesiology;  Laterality: N/A;  . WISDOM TOOTH EXTRACTION      Current Outpatient Medications  Medication Sig Dispense Refill  . allopurinol (ZYLOPRIM) 100 MG tablet TAKE 1 TABLET BY MOUTH EVERY DAY 30 tablet 5  . DEXILANT 60 MG capsule TAKE 1 CAPSULE BY MOUTH EVERY DAY 30 capsule 3  . famotidine (PEPCID) 10 MG tablet Take 10 mg by mouth as needed for heartburn or indigestion.    . ferrous sulfate 325 (65 FE) MG EC tablet Take 1 tablet (325 mg total) by mouth 2 (two) times daily. 60 tablet 3  . furosemide (LASIX) 40  MG tablet Take Daily for 5 Days, Then Take Daily Only As Needed for Edema 30 tablet 11  . gabapentin (NEURONTIN) 600 MG tablet Take 1 tablet (600 mg total) by mouth 3 (three) times daily. 270 tablet 1  . lactulose, encephalopathy, (CHRONULAC) 10 GM/15ML SOLN Take 15-30cc three times daily to Titrate for 3-4 soft bowel movements a day (Patient taking differently: Take 15-30cc 1-2 daily to Titrate for 3-4 soft bowel movements a day) 1892 mL 1  . LORazepam (ATIVAN) 1 MG tablet TAKE 1/2 TABLET BY MOUTH TWICE DAILY AS NEEDED FOR ANXIETY 30 tablet 1  . Multiple Vitamin (MULTIVITAMIN WITH MINERALS) TABS tablet Take 1 tablet by mouth  daily.    . nadolol (CORGARD) 40 MG tablet Take 1 tablet (40 mg total) by mouth daily. 30 tablet 0  . ondansetron (ZOFRAN-ODT) 4 MG disintegrating tablet DISSOLVE 1 TABLET ON THE TONGUE EVERY 8 HOURS AS NEEDED FOR NAUSEA 24 tablet 2  . PARoxetine (PAXIL) 20 MG tablet TAKE 1 TABLET(20 MG) BY MOUTH DAILY 30 tablet 5  . potassium chloride SA (KLOR-CON) 20 MEQ tablet Take Daily for 5 Days and then take on the days that you take Lasix 30 tablet 11  . rifaximin (XIFAXAN) 550 MG TABS tablet Take 1 tablet (550 mg total) by mouth 2 (two) times daily. 60 tablet 0  . sucralfate (CARAFATE) 1 g tablet TAKE 1 TABLET BY MOUTH BEFORE MEALS (Patient taking differently: Take 1 g by mouth daily. TAKE 1 TABLET BY MOUTH BEFORE MEALS) 90 tablet 3   No current facility-administered medications for this visit.    Allergies as of 12/28/2019  . (No Known Allergies)    Family History  Problem Relation Age of Onset  . Cancer Mother        ? etiology  . Heart disease Mother   . Colon polyps Sister 46       two sisters  . Brain cancer Father   . Liver disease Neg Hx     Social History   Socioeconomic History  . Marital status: Married    Spouse name: Not on file  . Number of children: 2  . Years of education: 73  . Highest education level: High school graduate  Occupational History  . 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 Smoking status: Current Every Day Smoker    Packs/day: 1.00    Years: 30.00    Pack years: 30.00    Types: Cigarettes    Start date: 12/24/1979  . Smokeless tobacco: Never Used  Substance and Sexual Activity  . Alcohol use: Yes    Alcohol/week: 0.0 standard drinks    Comment: Quit drinking 11/18/2019 - previous reports -drinks two 5th/daily  . Drug use: No  . Sexual activity: Yes    Partners: Female    Birth control/protection: None  Other Topics Concern  . Not on file  Social History Narrative   Lives at home with family.    Right-handed.   No daily use of caffeine.   Social Determinants of Health   Financial Resource Strain:   . Difficulty of Paying Living Expenses: Not on file  Food Insecurity:   . Worried About Charity fundraiser in the Last Year: Not on file  . Ran Out of Food in the Last Year: Not on file  Transportation Needs:   . Lack of Transportation (Medical): Not on file  . Lack of Transportation (Non-Medical): Not on file  Physical Activity:   . Days of Exercise per Week: Not on file  . Minutes of Exercise per Session: Not on file  Stress:   . Feeling of Stress : Not on file  Social Connections:   . Frequency of Communication with Friends and Family: Not on file  . Frequency of Social Gatherings with Friends and Family: Not on file  . Attends Religious Services: Not on file  . Active Member of Clubs or Organizations: Not on file  . Attends Archivist Meetings: Not on file  . Marital Status: Not on file    Review of Systems: General: Negative for anorexia, weight loss, fever, chills, fatigue, weakness. Eyes: Negative for vision changes.  ENT: Negative for hoarseness, difficulty swallowing , nasal congestion. CV: Negative for chest pain, angina, palpitations, dyspnea on exertion, peripheral edema.  Respiratory: Negative for dyspnea at rest, dyspnea on exertion, cough, sputum, wheezing.  GI: See history of present illness. GU:  Negative for dysuria, hematuria, urinary incontinence, urinary frequency, nocturnal urination.  MS: Negative for joint pain, low back pain.  Derm: Negative for rash or itching.  Neuro: Negative for weakness, abnormal sensation, seizure, frequent headaches, memory loss, confusion.  Psych: Negative for anxiety, depression, suicidal ideation, hallucinations.  Endo: Negative for unusual weight change.  Heme: Negative for bruising or bleeding. Allergy: Negative for rash or hives.   Physical Exam: BP 113/66   Pulse 89   Temp (!) 97 F (36.1 C) (Oral)    Ht 6\' 1"  (1.854 m)   Wt 251 lb 3.2 oz (113.9 kg)   BMI 33.14 kg/m  General:   Alert and oriented. Pleasant and cooperative. Well-nourished and well-developed.  Head:  Normocephalic and atraumatic. Eyes:  Without icterus, sclera clear and conjunctiva pink.  Ears:  Normal auditory acuity. Mouth:  No deformity or lesions, oral mucosa pink.  Throat/Neck:  Supple, without mass or thyromegaly. Cardiovascular:  S1, S2 present without murmurs appreciated. Normal pulses noted. Extremities without clubbing or edema. Respiratory:  Clear to auscultation bilaterally. No wheezes, rales, or rhonchi. No distress.  Gastrointestinal:  +BS, soft, non-tender and non-distended. No HSM noted. No guarding or rebound. No masses appreciated.  Rectal:  Deferred  Musculoskalatal:  Symmetrical without gross deformities. Normal posture. Skin:  Intact without significant lesions or rashes. Neurologic:  Alert and oriented x4;  grossly normal neurologically. Psych:  Alert and cooperative. Normal mood and affect. Heme/Lymph/Immune: No significant cervical adenopathy. No excessive bruising noted.    12/28/2019 9:17 AM   Disclaimer: This note was dictated with voice recognition software. Similar sounding words can inadvertently be transcribed and may not be corrected upon review.

## 2019-12-28 NOTE — Patient Instructions (Addendum)
Your health issues we discussed today were:   Cirrhosis and weakness due to significant blood loss: 1. Continue taking your current medications 2. Specifically, continue taking iron 3. Continue avoiding alcohol strictly indefinitely 4. Establish with a counselor to help with ongoing alcohol cessation 5. Call us if you have any worsening symptoms or any obvious bleeding  Overall I recommend:  1. Continue your other current medications 2. Return for follow-up in 6 months 3. Call us if you have any questions or concerns.  -----------------------------------------------------------------  COVID-19 Vaccine Information can be found at: [email or web address].uk For questions related to vaccine distribution or appointments, please email [email or web address] or call [phone].   -----------------------------------------------------------------  At [facility] we value your feedback. You may receive a survey about your visit today. Please share your experience as we strive to create trusting relationships with our patients to provide genuine, compassionate, quality care.  We appreciate your understanding and patience as we review any laboratory studies, imaging, and other diagnostic tests that are ordered as we care for you. Our office policy is 5 business days for review of these results, and any emergent or urgent results are addressed in a timely manner for your best interest. If you do not hear from our office in 1 week, please contact us.   We also encourage the use of MyChart, which contains your medical information for your review as well. If you are not enrolled in this feature, an access code is on this after visit summary for your convenience. Thank you for allowing [place] to be involved in your care.  It was great to see you today!  I hope you have a Happy New Year!!

## 2019-12-29 ENCOUNTER — Encounter: Payer: Self-pay | Admitting: *Deleted

## 2019-12-29 ENCOUNTER — Ambulatory Visit
Admission: RE | Admit: 2019-12-29 | Discharge: 2019-12-29 | Disposition: A | Discharge status: Home / Self Care | Payer: Managed Care, Other (non HMO) | Source: Ambulatory Visit | Attending: Interventional Radiology | Admitting: Interventional Radiology

## 2019-12-29 ENCOUNTER — Other Ambulatory Visit: Payer: Self-pay

## 2019-12-29 ENCOUNTER — Ambulatory Visit
Admission: RE | Admit: 2019-12-29 | Discharge: 2019-12-29 | Disposition: A | Discharge status: Home / Self Care | Payer: Managed Care, Other (non HMO) | Source: Ambulatory Visit | Attending: Radiology | Admitting: Radiology

## 2019-12-29 DIAGNOSIS — I8501 Esophageal varices with bleeding: Secondary | ICD-10-CM

## 2019-12-29 HISTORY — PX: IR RADIOLOGIST EVAL & MGMT: IMG5224

## 2019-12-29 NOTE — Progress Notes (Signed)
Chief Complaint: Cirrhosis, Portal HTN, Prior variceal hemorrhage  Referring Physician(s): Dr. Gala Romney  PCP: Dr. Baltazar Apo  History of Present Illness: Bradley Miles is a 54 y.o. male presenting as a scheduled follow up to [place] clinic, SP BRTO & TIPS for emergent upper GI hemorrhage secondary to portal HTN.   He is here today in the office with his wife for our interview.   Bradley Miles was admitted to [facility] 11/20/2019 through the ED with complaints of hematemesis, treated with EGD/clipping and medically in the ICU.  He has recurrence of bleeding, and ultimately was transferred to [facility].    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  TIPS and embolization performed 11/24/2019, 67mm x 8cm Viatorr.   He was discharged home 11/29/2019.    He denies any recurrence of bleeding, with no vomiting blood and no blood in his stool.  He is having 3-4 BM daily, and continues to take 2 doses of lactulose.  He is still fatigues, and takes 2 to 3 naps daily.  He has not yet returned to work.  His wife says he still has occasional word-finding difficulty, but this is not new after his hospitalization.  He denies any rigors/shaking.    He has a goal to return to work and to exercising/activities.  He feels he is not yet ready, since he is still tired/fatigued during the day.    He confirms he has not had any ETOH since before his admission to [facility], around 11/18/2019.    He had appointment with GI yesterday, and he says he has had a virtual visit with his PCP.    TIPS is patent on today's duplex.   Past Medical History:  Diagnosis Date  . Anxiety   . Enlarged liver   . GERD (gastroesophageal reflux disease)   . Hypertension   . Palpitations   . PVC's (premature ventricular contractions)   . Shortness of breath   . Tussive syncope  12/22/2019  . Varicose veins     Past Surgical History:  Procedure Laterality Date  . BIOPSY  02/02/2019   Procedure: BIOPSY;  Surgeon: Daneil Dolin, MD;  [facility];  Service: Endoscopy;;  gastric  . COLONOSCOPY WITH ESOPHAGOGASTRODUODENOSCOPY (EGD)  12/16/2012   internal hemorrhoids, colonic diverticulosis, benign polyps, screening in 2024. EGD with mild erosive reflux esophagitis, small hiatal hernia, negative H.pylori  . cyst reomved     from spine  . ESOPHAGOGASTRODUODENOSCOPY  05/19/09   RMR: Geographic distal esophageal erosions consistent with severe erosive reflux esophagitis. schatzki's ring s/P dilation/small hiatal hernia otherwise normal stomach  . ESOPHAGOGASTRODUODENOSCOPY (EGD) WITH PROPOFOL N/A 02/02/2019   Dr. Gala Romney: retained gastric contents. portal HTN gastropathy  . ESOPHAGOGASTRODUODENOSCOPY (EGD) WITH PROPOFOL N/A 11/20/2019   Procedure: ESOPHAGOGASTRODUODENOSCOPY (EGD) WITH PROPOFOL;  Surgeon: Daneil Dolin, MD;  [facility];  Service: Endoscopy;  Laterality: N/A;  . ESOPHAGOGASTRODUODENOSCOPY (EGD) WITH PROPOFOL N/A 11/24/2019   Procedure: ESOPHAGOGASTRODUODENOSCOPY (EGD) WITH PROPOFOL;  Surgeon: Ronnette Juniper, MD;  Location: [place];  Service: Gastroenterology;  Laterality: N/A;  . IR ANGIOGRAM SELECTIVE EACH ADDITIONAL VESSEL  11/22/2019  . IR ANGIOGRAM SELECTIVE EACH ADDITIONAL VESSEL  11/24/2019  . IR ANGIOGRAM SELECTIVE EACH ADDITIONAL VESSEL  11/24/2019  . IR ANGIOGRAM SELECTIVE EACH ADDITIONAL VESSEL  11/24/2019  . IR EMBO ART  VEN HEMORR LYMPH EXTRAV  INC GUIDE  ROADMAPPING  11/22/2019  . IR EMBO ART  VEN HEMORR LYMPH EXTRAV  INC GUIDE ROADMAPPING  11/24/2019  . IR RADIOLOGIST EVAL & MGMT  12/29/2019  . IR TIPS  11/24/2019  . IR US GUIDE VASC ACCESS RIGHT  11/22/2019  . IR VENOGRAM RENAL UNI LEFT  11/22/2019  . RADIOLOGY WITH ANESTHESIA N/A 11/24/2019   Procedure: IR WITH ANESTHESIA;  Surgeon: Radiologist, Medication, MD;   Location: [place];  Service: Radiology;  Laterality: N/A;  . TIPS PROCEDURE N/A 11/22/2019   Procedure: BRTO/TRANS-JUGULAR INTRAHEPATIC PORTAL SHUNT (TIPS);  Surgeon: Corrie Mckusick, DO;  Location: [place];  Service: Anesthesiology;  Laterality: N/A;  . WISDOM TOOTH EXTRACTION      Allergies: Patient has no known allergies.  Medications: Prior to Admission medications   Medication Sig Start Date End Date Taking? Authorizing Provider  allopurinol (ZYLOPRIM) 100 MG tablet TAKE 1 TABLET BY MOUTH EVERY DAY 12/10/19   [name], MD  DEXILANT 60 MG capsule TAKE 1 CAPSULE BY MOUTH EVERY DAY 12/02/19   Kathyrn Drown, MD  famotidine (PEPCID) 10 MG tablet Take 10 mg by mouth as needed for heartburn or indigestion.    [provider]  ferrous sulfate 325 (65 FE) MG EC tablet Take 1 tablet (325 mg total) by mouth 2 (two) times daily. 11/29/19 12/29/19  Elgergawy, Silver Huguenin, MD  furosemide (LASIX) 40 MG tablet Take Daily for 5 Days, Then Take Daily Only As Needed for Edema 12/01/19   Ahmed Prima, [place] M, PA-C  gabapentin (NEURONTIN) 600 MG tablet Take 1 tablet (600 mg total) by mouth 3 (three) times daily. 12/22/19   Kathrynn Ducking, MD  lactulose, encephalopathy, (CHRONULAC) 10 GM/15ML SOLN Take 15-30cc three times daily to Titrate for 3-4 soft bowel movements a day Patient taking differently: Take 15-30cc 1-2 daily to Titrate for 3-4 soft bowel movements a day 11/29/19   Elgergawy, Silver Huguenin, MD  LORazepam (ATIVAN) 1 MG tablet TAKE 1/2 TABLET BY MOUTH TWICE DAILY AS NEEDED FOR ANXIETY 12/02/19   Kathyrn Drown, MD  Multiple Vitamin (MULTIVITAMIN WITH MINERALS) TABS tablet Take 1 tablet by mouth daily. 11/30/19   Elgergawy, Silver Huguenin, MD  nadolol (CORGARD) 40 MG tablet Take 1 tablet (40 mg total) by mouth daily. 11/30/19   Elgergawy, Silver Huguenin, MD  ondansetron (ZOFRAN-ODT) 4 MG disintegrating tablet DISSOLVE 1 TABLET ON THE TONGUE EVERY 8 HOURS AS NEEDED FOR NAUSEA 08/19/19   Carlis Stable, NP    PARoxetine (PAXIL) 20 MG tablet TAKE 1 TABLET(20 MG) BY MOUTH DAILY 09/22/19   [name], MD  potassium chloride SA (KLOR-CON) 20 MEQ tablet Take Daily for 5 Days and then take on the days that you take Lasix 12/01/19   Strader, [place] M, PA-C  rifaximin (XIFAXAN) 550 MG TABS tablet Take 1 tablet (550 mg total) by mouth 2 (two) times daily. 11/29/19   Elgergawy, Silver Huguenin, MD  sucralfate (CARAFATE) 1 g tablet TAKE 1 TABLET BY MOUTH BEFORE MEALS Patient taking differently: Take 1 g by mouth daily. TAKE 1 TABLET BY MOUTH BEFORE MEALS 11/27/19   [name], MD     Family History  Problem Relation Age of Onset  . Cancer Mother        ? etiology  . Heart disease Mother   . Colon polyps Sister 78       two sisters  . Brain cancer Father   . Liver disease Neg Hx     Social History  Socioeconomic History  . Marital status: Married    Spouse name: Not on file  . Number of children: 2  . Years of education: 97  . Highest education level: High school graduate  Occupational History  . 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 Smoking status: Current Every Day Smoker    Packs/day: 1.00    Years: 30.00    Pack years: 30.00    Types: Cigarettes    Start date: 12/24/1979  . Smokeless tobacco: Never Used  Substance and Sexual Activity  . Alcohol use: Yes    Alcohol/week: 0.0 standard drinks    Comment: Quit drinking 11/18/2019 - previous reports -drinks two 5th/daily  . Drug use: No  . Sexual activity: Yes    Partners: Female    Birth control/protection: None  Other Topics Concern  . Not on file  Social History Narrative   Lives at home with family.   Right-handed.   No daily use of caffeine.   Social Determinants of Health   Financial Resource Strain:   . Difficulty of Paying Living Expenses: Not on file  Food Insecurity:   . Worried About Charity fundraiser in the Last Year: Not on file  . Ran Out of Food in the Last  Year: Not on file  Transportation Needs:   . Lack of Transportation (Medical): Not on file  . Lack of Transportation (Non-Medical): Not on file  Physical Activity:   . Days of Exercise per Week: Not on file  . Minutes of Exercise per Session: Not on file  Stress:   . Feeling of Stress : Not on file  Social Connections:   . Frequency of Communication with Friends and Family: Not on file  . Frequency of Social Gatherings with Friends and Family: Not on file  . Attends Religious Services: Not on file  . Active Member of Clubs or Organizations: Not on file  . Attends Archivist Meetings: Not on file  . Marital Status: Not on file      Review of Systems: A 12 point ROS discussed and pertinent positives are indicated in the HPI above.  All other systems are negative.  Review of Systems  Vital Signs: BP 109/64 (BP Location: Right Arm)   Pulse 69   Temp 98 F (36.7 C) (Temporal)   SpO2 97%   Physical Exam General: 54 yo male appearing stated age.  Well-developed, well-nourished.  No distress. HEENT: Atraumatic, normocephalic.  Conjugate gaze, extra-ocular motor intact. No scleral icterus or scleral injection. No lesions on external ears, nose, lips, or gums.  Oral mucosa moist, pink.  Neck: Symmetric with no goiter enlargement.  Chest/Lungs:  Symmetric chest with inspiration/expiration.  No labored breathing.     Heart:   No JVD appreciated.  Abdomen:  Soft, NT/ND, with + bowel sounds.   Genito-urinary: Deferred Neurologic: Alert & Oriented to person, place, and time.   Slightly flattened affect, with good insight. Appropriate questions.  Moving all 4 extremities with gross sensory intact.     Mallampati Score:     Imaging: IR Radiologist Eval & Mgmt  Result Date: 12/29/2019 Please refer to notes tab for details about interventional procedure. (Op Note)   Labs:  CBC: Recent Labs    11/27/19 0410 11/28/19 0344 11/29/19 0306 12/15/19 0941  WBC 7.4 6.6 5.8  5.7  HGB 8.0* 7.6* 7.7* 10.5*  HCT 25.3* 24.8* 25.0* 32.0*  PLT 104* 90* 95* 129*  COAGS: Recent Labs    11/19/19 2208 11/22/19 2304 11/25/19 1200 11/28/19 0344  INR 1.3* 1.8* 1.6* 1.3*  APTT 41*  --   --   --     BMP: Recent Labs    11/26/19 0347 11/28/19 0344 11/29/19 0306 12/15/19 0941  NA 141 137 138 137  K 4.0 3.7 3.4* 4.4  CL 108 103 104 101  CO2 29 30 29 24   GLUCOSE 159* 118* 116* 106*  BUN 19 10 8 6   CALCIUM 7.0* 7.3* 7.4* 8.2*  CREATININE 0.83 0.87 0.75 0.61*  GFRNONAA >60 >60 >60 116  GFRAA >60 >60 >60 134    LIVER FUNCTION TESTS: Recent Labs    11/26/19 0347 11/26/19 0347 11/28/19 0344 11/29/19 0306 12/15/19 0941 12/22/19 1127  BILITOT 5.5*  --  4.8* 4.7* 2.1*  --   AST 208*  --  118* 110* 60*  --   ALT 77*  --  58* 56* 21  --   ALKPHOS 80  --  131* 157* 264*  --   PROT 4.1*   < > 4.1* 4.3* 6.5 6.6  ALBUMIN 1.8*  --  1.6* 1.6* 2.3*  --    < > = values in this interval not displayed.    TUMOR MARKERS: No results for input(s): AFPTM, CEA, CA199, CHROMGRNA in the last 8760 hours.  Assessment and Plan:  54 yo male with ETOH cirrhosis and portal HTN, SP BRTO and TIPS/embo performed 11/22/2019 and 11/24/2019 respectively.   He has had resolution of his bleeding, and has been recovering at home.   I had a nice conversation with he and his wife regarding out treatment, the typical role of the liver and the consequences of cirrhosis/portal HTN, and the role of the TIPS/stent that we have placed. This was opportunity to emphasize the need to continue on his lactulose medication, and potentially change his diet to lower protein in the future if he ever has problems with encephalopathy.  At this time, I told him not to change, as he is doing fine and has a good appetite.    Today's duplex shows patent TIPS/stent.   Plan; - repeat office visit in 6 months with TIPS duplex - I have encouraged him to observe his other physician appointments      Electronically Signed: Corrie Mckusick 12/29/2019, 10:31 AM   I spent a total of    25 Minutes in face to face in clinical consultation, greater than 50% of which was counseling/coordinating care for portal HTN, cirrhosis, SP BRTO and TIPS.

## 2020-01-01 NOTE — Assessment & Plan Note (Signed)
Acute blood loss anemia related to alcoholic complications of cirrhosis.  Status post TIPS procedure with embolization of gastric variceal supply.  Received a total of 11 units of PRBC, 3 units of FFP, and 1 dose of vitamin K.  Also status BRTO.  His hemoglobin on discharge was 7.7.  He is on oral iron and on 12/15/2019 his hemoglobin improved to 10.5.  No overt signs of bleeding.  I again reinforced need to complete we avoid all alcohol.  Continue taking his iron.  Follow-up with primary care as recommended.  Follow-up with our office in 6 months.  Call for any problems especially any obvious bleeding.

## 2020-01-01 NOTE — Assessment & Plan Note (Signed)
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  Total of 11 units of blood.  Intermittent confusion.  He has previously been told not to work due to his confusion.  This seems to be improved today.  Recommended he continue his iron, alcohol cessation, current meds.  Call with problems.  I indicated he would likely be out of work at least 2 to 3 weeks given his persistent fatigue after significant blood loss.  His hemoglobin has improved.  In order for him to be able to return to work, where he does have some moderate to heavy lifting, he would need to have no further episodes of confusion to make it unsafe to drive or complete heavy lifting.  His fatigue would need to be significantly improved as well.  He would also need to be completely abstinent from alcohol.  When he does return to work, I recommend limiting his lifting to 15 pounds at max until he has progressive improvement.  Follow-up in 6 months.

## 2020-01-01 NOTE — Assessment & Plan Note (Signed)
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  He did have a complicated hospital admission for liver related issues and has not had any alcohol since then, 11/18/2019.  He states he is committed to stopping alcohol.  I recommended highly that he establish with a counselor to help with alcohol cessation.  I recommended he continue to avoid alcohol strictly as I am not sure how many more times he can have an admission like he tested and cirrhotic.  We have previously made it clear to him that if he keeps drinking he will likely die.  He does have family support.  At this point, no need for updated imaging or labs.  Continue current medications and follow-up in 6 months.

## 2020-01-05 NOTE — Telephone Encounter (Signed)
We can consider a return to work in 2-3 weeks (approx 01/18/20) IF his confusion is improved/no occurrences in the previous 2 weeks and his fatigue and weakness has improved. May be later than 2/8 depending on how he's doing.

## 2020-01-05 NOTE — Telephone Encounter (Signed)
Noted. Pt's spouse is aware and will call back around 01/18/2020 with a progress report of how pt is doing.

## 2020-01-07 ENCOUNTER — Ambulatory Visit: Payer: Managed Care, Other (non HMO) | Attending: Internal Medicine

## 2020-01-07 ENCOUNTER — Other Ambulatory Visit: Payer: Self-pay

## 2020-01-07 DIAGNOSIS — Z20822 Contact with and (suspected) exposure to covid-19: Secondary | ICD-10-CM

## 2020-01-08 LAB — NOVEL CORONAVIRUS, NAA: SARS-CoV-2, NAA: NOT DETECTED

## 2020-01-11 ENCOUNTER — Telehealth: Payer: Self-pay | Admitting: Internal Medicine

## 2020-01-11 NOTE — Telephone Encounter (Signed)
Spoke with pts spouse. EG, spouse just wanted to give an update. She feels that pt is ok to call back on 2/18 as directed previously. Pt was tried today and felt restless. Spouse didn't understand because she saw him sleeping. Pt woke up for good after noon and seems to be ok. Spouse said she probably shouldn't called and will touch basis if needed.

## 2020-01-11 NOTE — Telephone Encounter (Signed)
Noted  

## 2020-01-11 NOTE — Telephone Encounter (Signed)
Pt's wife called to say that patient's level consciousness was in and out. She thinks he is worse since being seen last week. Please call [phone]

## 2020-01-11 NOTE — Telephone Encounter (Signed)
Noted. Let me know if any further issues.

## 2020-01-12 ENCOUNTER — Ambulatory Visit: Payer: Managed Care, Other (non HMO) | Admitting: Student

## 2020-01-12 NOTE — Progress Notes (Signed)
Duplicate Note.

## 2020-01-12 NOTE — Progress Notes (Deleted)
Cardiology Office Note    Date:  01/12/2020   ID:  Bradley, Miles 1966-07-18, MRN [id number]  PCP:  [name], MD  Cardiologist: Kate Sable, MD    No chief complaint on file.   History of Present Illness:    Bradley Miles is a 54 y.o. male with past medical history of chronic liver disease in the setting of alcohol use, HTN, GERD and palpitations who presents to the office today for 34-month follow-up.   He most recently had a phone visit with myself in 11/2019 after having been admitted to [facility] for evaluation of coffee-ground emesis and found to have new oozing of esophageal varices. Underwent TIPS procedure with embolization of the gastric variceal supply. Received a total of 11 units pRBCs and he was started on Nadolol 40mg  daily. At the time of follow-up, he reported having gained approximately 15+ pounds since discharge with associated lower extremity edema and abdominal distension. Denied any recurrent alcohol use. Was given an Rx for Lasix 40mg  daily to take for 5 days then as needed with a follow-up BMET in 1 week.     Past Medical History:  Diagnosis Date  . Anxiety   . Enlarged liver   . GERD (gastroesophageal reflux disease)   . Hypertension   . Palpitations   . PVC's (premature ventricular contractions)   . Shortness of breath   . Tussive syncope 12/22/2019  . Varicose veins     Past Surgical History:  Procedure Laterality Date  . BIOPSY  02/02/2019   Procedure: BIOPSY;  Surgeon: Daneil Dolin, MD;  [facility]: [facility];  Service: Endoscopy;;  gastric  . COLONOSCOPY WITH ESOPHAGOGASTRODUODENOSCOPY (EGD)  12/16/2012   internal hemorrhoids, colonic diverticulosis, benign polyps, screening in 2024. EGD with mild erosive reflux esophagitis, small hiatal hernia, negative H.pylori  . cyst reomved     from spine  . ESOPHAGOGASTRODUODENOSCOPY  05/19/09   RMR: Geographic distal esophageal erosions consistent with severe erosive reflux  esophagitis. schatzki's ring s/P dilation/small hiatal hernia otherwise normal stomach  . ESOPHAGOGASTRODUODENOSCOPY (EGD) WITH PROPOFOL N/A 02/02/2019   Dr. Gala Romney: retained gastric contents. portal HTN gastropathy  . ESOPHAGOGASTRODUODENOSCOPY (EGD) WITH PROPOFOL N/A 11/20/2019   Procedure: ESOPHAGOGASTRODUODENOSCOPY (EGD) WITH PROPOFOL;  Surgeon: Daneil Dolin, MD;  [facility];  Service: Endoscopy;  Laterality: N/A;  . ESOPHAGOGASTRODUODENOSCOPY (EGD) WITH PROPOFOL N/A 11/24/2019   Procedure: ESOPHAGOGASTRODUODENOSCOPY (EGD) WITH PROPOFOL;  Surgeon: Ronnette Juniper, MD;  Location: [facility];  Service: Gastroenterology;  Laterality: N/A;  . IR ANGIOGRAM SELECTIVE EACH ADDITIONAL VESSEL  11/22/2019  . IR ANGIOGRAM SELECTIVE EACH ADDITIONAL VESSEL  11/24/2019  . IR ANGIOGRAM SELECTIVE EACH ADDITIONAL VESSEL  11/24/2019  . IR ANGIOGRAM SELECTIVE EACH ADDITIONAL VESSEL  11/24/2019  . IR EMBO ART  VEN HEMORR LYMPH EXTRAV  INC GUIDE ROADMAPPING  11/22/2019  . IR EMBO ART  VEN HEMORR LYMPH EXTRAV  INC GUIDE ROADMAPPING  11/24/2019  . IR RADIOLOGIST EVAL & MGMT  12/29/2019  . IR TIPS  11/24/2019  . IR US GUIDE VASC ACCESS RIGHT  11/22/2019  . IR VENOGRAM RENAL UNI LEFT  11/22/2019  . RADIOLOGY WITH ANESTHESIA N/A 11/24/2019   Procedure: IR WITH ANESTHESIA;  Surgeon: Radiologist, Medication, MD;  Location: [place];  Service: Radiology;  Laterality: N/A;  . TIPS PROCEDURE N/A 11/22/2019   Procedure: BRTO/TRANS-JUGULAR INTRAHEPATIC PORTAL SHUNT (TIPS);  Surgeon: Corrie Mckusick, DO;  Location: [facility];  Service: Anesthesiology;  Laterality: N/A;  . WISDOM  TOOTH EXTRACTION      Current Medications: Outpatient Medications Prior to Visit  Medication Sig Dispense Refill  . allopurinol (ZYLOPRIM) 100 MG tablet TAKE 1 TABLET BY MOUTH EVERY DAY 30 tablet 5  . DEXILANT 60 MG capsule TAKE 1 CAPSULE BY MOUTH EVERY DAY 30 capsule 3  . famotidine (PEPCID) 10 MG tablet Take 10 mg by mouth as needed for  heartburn or indigestion.    . ferrous sulfate 325 (65 FE) MG EC tablet Take 1 tablet (325 mg total) by mouth 2 (two) times daily. 60 tablet 3  . furosemide (LASIX) 40 MG tablet Take Daily for 5 Days, Then Take Daily Only As Needed for Edema 30 tablet 11  . gabapentin (NEURONTIN) 600 MG tablet Take 1 tablet (600 mg total) by mouth 3 (three) times daily. 270 tablet 1  . lactulose, encephalopathy, (CHRONULAC) 10 GM/15ML SOLN Take 15-30cc three times daily to Titrate for 3-4 soft bowel movements a day (Patient taking differently: Take 15-30cc 1-2 daily to Titrate for 3-4 soft bowel movements a day) 1892 mL 1  . LORazepam (ATIVAN) 1 MG tablet TAKE 1/2 TABLET BY MOUTH TWICE DAILY AS NEEDED FOR ANXIETY 30 tablet 1  . Multiple Vitamin (MULTIVITAMIN WITH MINERALS) TABS tablet Take 1 tablet by mouth daily.    . nadolol (CORGARD) 40 MG tablet Take 1 tablet (40 mg total) by mouth daily. 30 tablet 0  . ondansetron (ZOFRAN-ODT) 4 MG disintegrating tablet DISSOLVE 1 TABLET ON THE TONGUE EVERY 8 HOURS AS NEEDED FOR NAUSEA 24 tablet 2  . PARoxetine (PAXIL) 20 MG tablet TAKE 1 TABLET(20 MG) BY MOUTH DAILY 30 tablet 5  . potassium chloride SA (KLOR-CON) 20 MEQ tablet Take Daily for 5 Days and then take on the days that you take Lasix 30 tablet 11  . rifaximin (XIFAXAN) 550 MG TABS tablet Take 1 tablet (550 mg total) by mouth 2 (two) times daily. 60 tablet 0  . sucralfate (CARAFATE) 1 g tablet TAKE 1 TABLET BY MOUTH BEFORE MEALS (Patient taking differently: Take 1 g by mouth daily. TAKE 1 TABLET BY MOUTH BEFORE MEALS) 90 tablet 3   No facility-administered medications prior to visit.     Allergies:   Patient has no known allergies.   Social History   Socioeconomic History  . Marital status: Married    Spouse name: Not on file  . Number of children: 2  . Years of education: 40  . Highest education level: High school graduate  Occupational History  . Occupation: [employer]    Comment: Technical sales engineer  Tobacco Use  . Smoking status: Current Every Day Smoker    Packs/day: 1.00    Years: 30.00    Pack years: 30.00    Types: Cigarettes    Start date: 12/24/1979  . Smokeless tobacco: Never Used  Substance and Sexual Activity  . Alcohol use: Yes    Alcohol/week: 0.0 standard drinks    Comment: Quit drinking 11/18/2019 - previous reports -drinks two 5th/daily  . Drug use: No  . Sexual activity: Yes    Partners: Female    Birth control/protection: None  Other Topics Concern  . Not on file  Social History Narrative   Lives at home with family.   Right-handed.   No daily use of caffeine.   Social Determinants of Health   Financial Resource Strain:   . Difficulty of Paying Living Expenses: Not on file  Food Insecurity:   . Worried About [employer] of  Food in the Last Year: Not on file  . Ran Out of Food in the Last Year: Not on file  Transportation Needs:   . Lack of Transportation (Medical): Not on file  . Lack of Transportation (Non-Medical): Not on file  Physical Activity:   . Days of Exercise per Week: Not on file  . Minutes of Exercise per Session: Not on file  Stress:   . Feeling of Stress : Not on file  Social Connections:   . Frequency of Communication with Friends and Family: Not on file  . Frequency of Social Gatherings with Friends and Family: Not on file  . Attends Religious Services: Not on file  . Active Member of Clubs or Organizations: Not on file  . Attends Archivist Meetings: Not on file  . Marital Status: Not on file     Family History:  The patient's ***family history includes Brain cancer in his father; Cancer in his mother; Colon polyps (age of onset: 32) in his sister; Heart disease in his mother.   Review of Systems:   Please see the history of present illness.     General:  No chills, fever, night sweats or weight changes.  Cardiovascular:  No chest pain, dyspnea on exertion, edema, orthopnea, palpitations, paroxysmal  nocturnal dyspnea. Dermatological: No rash, lesions/masses Respiratory: No cough, dyspnea Urologic: No hematuria, dysuria Abdominal:   No nausea, vomiting, diarrhea, bright red blood per rectum, melena, or hematemesis Neurologic:  No visual changes, wkns, changes in mental status. All other systems reviewed and are otherwise negative except as noted above.   Physical Exam:    VS:  There were no vitals taken for this visit.   General: Well developed, well nourished,male appearing in no acute distress. Head: Normocephalic, atraumatic, sclera non-icteric, no xanthomas, nares are without discharge.  Neck: No carotid bruits. JVD not elevated.  Lungs: Respirations regular and unlabored, without wheezes or rales.  Heart: ***Regular rate and rhythm. No S3 or S4.  No murmur, no rubs, or gallops appreciated. Abdomen: Soft, non-tender, non-distended with normoactive bowel sounds. No hepatomegaly. No rebound/guarding. No obvious abdominal masses. Msk:  Strength and tone appear normal for age. No joint deformities or effusions. Extremities: No clubbing or cyanosis. No edema.  Distal pedal pulses are 2+ bilaterally. Neuro: Alert and oriented X 3. Moves all extremities spontaneously. No focal deficits noted. Psych:  Responds to questions appropriately with a normal affect. Skin: No rashes or lesions noted  Wt Readings from Last 3 Encounters:  12/28/19 251 lb 3.2 oz (113.9 kg)  12/22/19 250 lb (113.4 kg)  11/21/19 262 lb 5.6 oz (119 kg)        Studies/Labs Reviewed:   EKG:  EKG is*** ordered today.  The ekg ordered today demonstrates ***  Recent Labs: 11/25/2019: Magnesium 1.4 12/15/2019: ALT 21; BUN 6; Creatinine, Ser 0.61; Hemoglobin 10.5; Platelets 129; Potassium 4.4; Sodium 137   Lipid Panel    Component Value Date/Time   CHOL 219 (H) 03/17/2014 0801   TRIG 221 (H) 03/17/2014 0801   HDL 51 03/17/2014 0801   CHOLHDL 4.3 03/17/2014 0801   VLDL 44 (H) 03/17/2014 0801   LDLCALC 124 (H)  03/17/2014 0801    Additional studies/ records that were reviewed today include:   Echocardiogram: 05/2018 Study Conclusions   - Left ventricle: The cavity size was normal. Wall thickness was  increased in a pattern of moderate LVH. Systolic function was  normal. The estimated ejection fraction was in the range of  60%  to 65%. Wall motion was normal; there were no regional wall  motion abnormalities. The study is not technically sufficient to  allow evaluation of LV diastolic function.  - Aortic valve: Valve area (VTI): 2.67 cm^2. Valve area (Vmax): 2.6  cm^2.  - Technically adequate study.    Event Monitor: 10/2019  Sinus rhythm and sinus tachycardia with rare PVCs. Symptoms correlated with sinus rhythm, sinus tachycardia, and PVC.  Assessment:    No diagnosis found.   Plan:   In order of problems listed above:  1. ***    Medication Adjustments/Labs and Tests Ordered: Current medicines are reviewed at length with the patient today.  Concerns regarding medicines are outlined above.  Medication changes, Labs and Tests ordered today are listed in the Patient Instructions below. There are no Patient Instructions on file for this visit.   Signed, Erma Heritage, PA-C  01/12/2020 6:37 AM    [place] [address] [place],  [address] Phone: [phone] Fax: [phone]

## 2020-01-13 ENCOUNTER — Encounter: Payer: Self-pay | Admitting: Student

## 2020-01-13 ENCOUNTER — Telehealth (INDEPENDENT_AMBULATORY_CARE_PROVIDER_SITE_OTHER): Payer: Managed Care, Other (non HMO) | Admitting: Student

## 2020-01-13 ENCOUNTER — Encounter: Payer: Self-pay | Admitting: Family Medicine

## 2020-01-13 VITALS — BP 122/82 | HR 106 | Ht 73.0 in | Wt 250.0 lb

## 2020-01-13 DIAGNOSIS — I10 Essential (primary) hypertension: Secondary | ICD-10-CM

## 2020-01-13 DIAGNOSIS — R002 Palpitations: Secondary | ICD-10-CM | POA: Diagnosis not present

## 2020-01-13 DIAGNOSIS — K7031 Alcoholic cirrhosis of liver with ascites: Secondary | ICD-10-CM

## 2020-01-13 DIAGNOSIS — R600 Localized edema: Secondary | ICD-10-CM

## 2020-01-13 MED ORDER — NADOLOL 80 MG PO TABS: 80.0000 mg | ORAL_TABLET | Freq: Every day | ORAL | 3 refills | Status: DC

## 2020-01-13 MED ORDER — FUROSEMIDE 40 MG PO TABS: 40.0000 mg | ORAL_TABLET | Freq: Every day | ORAL | 11 refills | Status: DC | PRN

## 2020-01-13 MED ORDER — POTASSIUM CHLORIDE CRYS ER 20 MEQ PO TBCR: 20.0000 meq | EXTENDED_RELEASE_TABLET | Freq: Every day | ORAL | 11 refills | Status: DC | PRN

## 2020-01-13 NOTE — Patient Instructions (Signed)
Medication Instructions:  Increase Nadolol to 80 mg Daily  Only Take Lasix As needed for edema   Labwork: None   Testing/Procedures: None   Follow-Up: Your physician wants you to follow-up in: 6 Months with Dr. Virgina Jock will receive a reminder letter in the mail two months in advance. If you don't receive a letter, please call our office to schedule the follow-up appointment.   Any Other Special Instructions Will Be Listed Below (If Applicable).     If you need a refill on your cardiac medications before your next appointment, please call your pharmacy.  Thank you for choosing [facility]!

## 2020-01-13 NOTE — Progress Notes (Signed)
Virtual Visit via Video Note   This visit type was conducted due to national recommendations for restrictions regarding the COVID-19 Pandemic (e.g. social distancing) in an effort to limit this patient's exposure and mitigate transmission in our community.  Due to his co-morbid illnesses, this patient is at least at moderate risk for complications without adequate follow up.  This format is felt to be most appropriate for this patient at this time.  All issues noted in this document were discussed and addressed.  A limited physical exam was performed with this format.  Please refer to the patient's chart for his consent to telehealth for [facility].   Date:  01/13/2020   ID:  Bradley Miles, DOB 04-23-1966, MRN [id number]  Patient Location: Home Provider Location: Office  PCP:  [name], MD  Cardiologist:  Kate Sable, MD  Electrophysiologist:  None   Evaluation Performed:  Follow-Up Visit  Chief Complaint:  Palpitations  History of Present Illness:    Bradley Miles is a 54 y.o. male with past medical history of chronic liver disease in the setting of alcohol use, HTN, GERD and palpitations who presents for a 58-month follow-up telehealth vist.   He most recently had a phone visit with myself in 11/2019 after having been admitted to [facility] for evaluation of coffee-ground emesis and found to have new oozing of esophageal varices. Underwent TIPS procedure with embolization of the gastric variceal supply. Received a total of 11 units pRBCs and he was started on Nadolol 40mg  daily. At the time of follow-up, he reported having gained approximately 15+ pounds since discharge with associated lower extremity edema and abdominal distension. Denied any recurrent alcohol use. Was given an Rx for Lasix 40mg  daily to take for 5 days then as needed with a follow-up BMET in 1 week. He did have repeat labs on 12/15/2019 and this showed creatinine at 0.61 and K+ at 4.4.  In talking  with the patient and his wife today, he reports still having palpitations which occur on a daily basis. Heart rate has been in the 90's to low 100's when checked at home. He denies any associated chest pain, lightheadedness or presyncope. Does have baseline dyspnea on exertion.   His weight has actually declined to 250 lbs on his home scales as this was previously 270 lbs after hospital discharge. He was supposed to take Lasix for 5 days but has actually continued on this daily since initiation in 11/2019. Reports his abdominal distention has resolved and he denies any lower extremity edema. No recurrent alcohol use since his most recent admission.   The patient does not have symptoms concerning for COVID-19 infection (fever, chills, cough, or new shortness of breath).    Past Medical History:  Diagnosis Date  . Anxiety   . Enlarged liver   . GERD (gastroesophageal reflux disease)   . Hypertension   . Palpitations   . PVC's (premature ventricular contractions)   . Shortness of breath   . Tussive syncope 12/22/2019  . Varicose veins    Past Surgical History:  Procedure Laterality Date  . BIOPSY  02/02/2019   Procedure: BIOPSY;  Surgeon: Daneil Dolin, MD;  [facility];  Service: Endoscopy;;  gastric  . COLONOSCOPY WITH ESOPHAGOGASTRODUODENOSCOPY (EGD)  12/16/2012   internal hemorrhoids, colonic diverticulosis, benign polyps, screening in 2024. EGD with mild erosive reflux esophagitis, small hiatal hernia, negative H.pylori  . cyst reomved     from spine  . ESOPHAGOGASTRODUODENOSCOPY  05/19/09   RMR: Geographic distal esophageal erosions consistent with severe erosive reflux esophagitis. schatzki's ring s/P dilation/small hiatal hernia otherwise normal stomach  . ESOPHAGOGASTRODUODENOSCOPY (EGD) WITH PROPOFOL N/A 02/02/2019   Dr. Gala Romney: retained gastric contents. portal HTN gastropathy  . ESOPHAGOGASTRODUODENOSCOPY (EGD) WITH PROPOFOL N/A 11/20/2019   Procedure:  ESOPHAGOGASTRODUODENOSCOPY (EGD) WITH PROPOFOL;  Surgeon: Daneil Dolin, MD;  [facility];  Service: Endoscopy;  Laterality: N/A;  . ESOPHAGOGASTRODUODENOSCOPY (EGD) WITH PROPOFOL N/A 11/24/2019   Procedure: ESOPHAGOGASTRODUODENOSCOPY (EGD) WITH PROPOFOL;  Surgeon: Ronnette Juniper, MD;  Location: [place];  Service: Gastroenterology;  Laterality: N/A;  . IR ANGIOGRAM SELECTIVE EACH ADDITIONAL VESSEL  11/22/2019  . IR ANGIOGRAM SELECTIVE EACH ADDITIONAL VESSEL  11/24/2019  . IR ANGIOGRAM SELECTIVE EACH ADDITIONAL VESSEL  11/24/2019  . IR ANGIOGRAM SELECTIVE EACH ADDITIONAL VESSEL  11/24/2019  . IR EMBO ART  VEN HEMORR LYMPH EXTRAV  INC GUIDE ROADMAPPING  11/22/2019  . IR EMBO ART  VEN HEMORR LYMPH EXTRAV  INC GUIDE ROADMAPPING  11/24/2019  . IR RADIOLOGIST EVAL & MGMT  12/29/2019  . IR TIPS  11/24/2019  . IR US GUIDE VASC ACCESS RIGHT  11/22/2019  . IR VENOGRAM RENAL UNI LEFT  11/22/2019  . RADIOLOGY WITH ANESTHESIA N/A 11/24/2019   Procedure: IR WITH ANESTHESIA;  Surgeon: Radiologist, Medication, MD;  Location: [place];  Service: Radiology;  Laterality: N/A;  . TIPS PROCEDURE N/A 11/22/2019   Procedure: BRTO/TRANS-JUGULAR INTRAHEPATIC PORTAL SHUNT (TIPS);  Surgeon: Corrie Mckusick, DO;  Location: [place];  Service: Anesthesiology;  Laterality: N/A;  . WISDOM TOOTH EXTRACTION       Current Meds  Medication Sig  . allopurinol (ZYLOPRIM) 100 MG tablet TAKE 1 TABLET BY MOUTH EVERY DAY  . DEXILANT 60 MG capsule TAKE 1 CAPSULE BY MOUTH EVERY DAY  . famotidine (PEPCID) 10 MG tablet Take 10 mg by mouth as needed for heartburn or indigestion.  . ferrous sulfate 325 (65 FE) MG EC tablet Take 1 tablet (325 mg total) by mouth 2 (two) times daily.  . furosemide (LASIX) 40 MG tablet Take 1 tablet (40 mg total) by mouth daily as needed for edema. Take Daily for 5 Days, Then Take Daily Only As Needed for Edema  . gabapentin (NEURONTIN) 600 MG tablet Take 1 tablet (600 mg total) by mouth 3 (three)  times daily.  Marland Kitchen lactulose, encephalopathy, (CHRONULAC) 10 GM/15ML SOLN Take 15-30cc three times daily to Titrate for 3-4 soft bowel movements a day (Patient taking differently: Take 15-30cc 1-2 daily to Titrate for 3-4 soft bowel movements a day)  . LORazepam (ATIVAN) 1 MG tablet TAKE 1/2 TABLET BY MOUTH TWICE DAILY AS NEEDED FOR ANXIETY  . Multiple Vitamin (MULTIVITAMIN WITH MINERALS) TABS tablet Take 1 tablet by mouth daily.  . nadolol (CORGARD) 80 MG tablet Take 1 tablet (80 mg total) by mouth daily.  . ondansetron (ZOFRAN-ODT) 4 MG disintegrating tablet DISSOLVE 1 TABLET ON THE TONGUE EVERY 8 HOURS AS NEEDED FOR NAUSEA  . PARoxetine (PAXIL) 20 MG tablet TAKE 1 TABLET(20 MG) BY MOUTH DAILY  . potassium chloride SA (KLOR-CON) 20 MEQ tablet Take 1 tablet (20 mEq total) by mouth daily as needed (Take when taking Lasix).  . rifaximin (XIFAXAN) 550 MG TABS tablet Take 1 tablet (550 mg total) by mouth 2 (two) times daily.  . sucralfate (CARAFATE) 1 g tablet TAKE 1 TABLET BY MOUTH BEFORE MEALS (Patient taking differently: Take 1 g by mouth daily. TAKE 1 TABLET BY MOUTH BEFORE MEALS)  . [  DISCONTINUED] furosemide (LASIX) 40 MG tablet Take Daily for 5 Days, Then Take Daily Only As Needed for Edema  . [DISCONTINUED] nadolol (CORGARD) 40 MG tablet Take 1 tablet (40 mg total) by mouth daily.  . [DISCONTINUED] potassium chloride SA (KLOR-CON) 20 MEQ tablet Take Daily for 5 Days and then take on the days that you take Lasix     Allergies:   Patient has no known allergies.   Social History   Tobacco Use  . Smoking status: Current Every Day Smoker    Packs/day: 1.00    Years: 30.00    Pack years: 30.00    Types: Cigarettes    Start date: 12/24/1979  . Smokeless tobacco: Never Used  Substance Use Topics  . Alcohol use: Yes    Alcohol/week: 0.0 standard drinks    Comment: Quit drinking 11/18/2019 - previous reports -drinks two 5th/daily  . Drug use: No     Family Hx: The patient's family history  includes Brain cancer in his father; Cancer in his mother; Colon polyps (age of onset: 56) in his sister; Heart disease in his mother. There is no history of Liver disease.  ROS:   Please see the history of present illness.     All other systems reviewed and are negative.   Prior CV studies:   The following studies were reviewed today:  NST: 12/2015  No diagnostic ST segment changes to indicate ischemia.  Small region of apparent soft tissue attenuation affecting the lateral apex. No evidence of scar or ischemia.  This is a low risk study.  Nuclear stress EF: 69%.  Echocardiogram: 05/2018 Study Conclusions   - Left ventricle: The cavity size was normal. Wall thickness was  increased in a pattern of moderate LVH. Systolic function was  normal. The estimated ejection fraction was in the range of 60%  to 65%. Wall motion was normal; there were no regional wall  motion abnormalities. The study is not technically sufficient to  allow evaluation of LV diastolic function.  - Aortic valve: Valve area (VTI): 2.67 cm^2. Valve area (Vmax): 2.6  cm^2.  - Technically adequate study.    Event Monitor: 10/2019  Sinus rhythm and sinus tachycardia with rare PVCs. Symptoms correlated with sinus rhythm, sinus tachycardia, and PVC.  Labs/Other Tests and Data Reviewed:    EKG:  No ECG reviewed.  Recent Labs: 11/25/2019: Magnesium 1.4 12/15/2019: ALT 21; BUN 6; Creatinine, Ser 0.61; Hemoglobin 10.5; Platelets 129; Potassium 4.4; Sodium 137   Recent Lipid Panel Lab Results  Component Value Date/Time   CHOL 219 (H) 03/17/2014 08:01 AM   TRIG 221 (H) 03/17/2014 08:01 AM   HDL 51 03/17/2014 08:01 AM   CHOLHDL 4.3 03/17/2014 08:01 AM   LDLCALC 124 (H) 03/17/2014 08:01 AM    Wt Readings from Last 3 Encounters:  01/13/20 250 lb (113.4 kg)  12/28/19 251 lb 3.2 oz (113.9 kg)  12/22/19 250 lb (113.4 kg)     Objective:    Vital Signs:  BP 122/82   Pulse (!) 106   Ht 6\' 1"   (1.854 m)   Wt 250 lb (113.4 kg)   BMI 32.98 kg/m    General: Pleasant male appearing in NAD Psych: Normal affect. Neuro: Alert and oriented X 3. Moves all extremities spontaneously. Lungs:  Resp appear regular and unlabored.  ASSESSMENT & PLAN:    1. Palpitations - Prior monitoring last year showed sinus rhythm and sinus tachycardia with rare PVC's as outlined above. He does continue to  experience intermittent palpitations and was recently switched to Nadolol during his admission given esophageal varices. Will titrate dosing from 40mg  daily to 80mg  daily. I encouraged them to continue to monitor HR and BP response with this dose adjustment.   2. HTN - BP was well controlled at 122/82 on most recent check. Will plan to titrate Nadolol as outlined above.  3. Lower extremity edema - Suspect this is secondary to the amount of IVF and transfusions he received during admission in 11/2019. Symptoms have now resolved and weight is below his previous baseline. I informed him to stop daily Lasix and only take this as needed for worsening edema or weight gain greater than 3 pounds overnight or 5 pounds in 1 week. He will only utilize potassium on the days he takes Lasix.  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 He denies any recurrent alcohol use since his most recent admission.    COVID-19 Education: The signs and symptoms of COVID-19 were discussed with the patient and how to seek care for testing (follow up with PCP or arrange E-visit). The importance of social distancing was discussed today.  Time:   Today, I have spent 17 minutes with the patient with telehealth technology discussing the above problems.     Medication Adjustments/Labs and Tests Ordered: Current medicines are reviewed at length with the patient today.  Concerns regarding medicines are outlined above.   Tests Ordered: No orders of the defined types were placed in this encounter.   Medication Changes: Meds ordered  this encounter  Medications  . nadolol (CORGARD) 80 MG tablet    Sig: Take 1 tablet (80 mg total) by mouth daily.    Dispense:  90 tablet    Refill:  3    Order Specific Question:   Supervising Provider    Answer:   Dorothy Spark [id number]  . furosemide (LASIX) 40 MG tablet    Sig: Take 1 tablet (40 mg total) by mouth daily as needed for edema. Take Daily for 5 Days, Then Take Daily Only As Needed for Edema    Dispense:  30 tablet    Refill:  11    Order Specific Question:   Supervising Provider    Answer:   Dorothy Spark [[id number]]  . potassium chloride SA (KLOR-CON) 20 MEQ tablet    Sig: Take 1 tablet (20 mEq total) by mouth daily as needed (Take when taking Lasix).    Dispense:  30 tablet    Refill:  11    Order Specific Question:   Supervising Provider    Answer:   Dorothy Spark [[id number]]    Follow Up:  In Person in 6 month(s)  Signed, Erma Heritage, PA-C  01/13/2020 5:06 PM    [place]

## 2020-01-14 ENCOUNTER — Other Ambulatory Visit: Payer: Self-pay | Admitting: Family Medicine

## 2020-01-19 ENCOUNTER — Telehealth: Payer: Self-pay | Admitting: Internal Medicine

## 2020-01-19 ENCOUNTER — Other Ambulatory Visit: Payer: Self-pay | Admitting: Nurse Practitioner

## 2020-01-19 DIAGNOSIS — R7989 Other specified abnormal findings of blood chemistry: Secondary | ICD-10-CM

## 2020-01-19 DIAGNOSIS — K7682 Hepatic encephalopathy: Secondary | ICD-10-CM

## 2020-01-19 DIAGNOSIS — K7290 Hepatic failure, unspecified without coma: Secondary | ICD-10-CM

## 2020-01-19 DIAGNOSIS — R1011 Right upper quadrant pain: Secondary | ICD-10-CM

## 2020-01-19 DIAGNOSIS — K7031 Alcoholic cirrhosis of liver with ascites: Secondary | ICD-10-CM

## 2020-01-19 DIAGNOSIS — R945 Abnormal results of liver function studies: Secondary | ICD-10-CM

## 2020-01-19 NOTE — Telephone Encounter (Signed)
Per previously discussed parameters needed to be able to return to work (no confusion, strong enough to get about, etc) he should not return to work at this time.  I would call whoever increased his heart medication (looks like cardiology) and discuss the weakness/low BP when standing to see if his meds need to be adjusted.  Cc: JL for FYI related to previously completed FMLA paperwork

## 2020-01-19 NOTE — Telephone Encounter (Signed)
Pt's wife, [name], called to say that they were told to call EG to touch base about patient returning to work. Please advise. [phone]

## 2020-01-19 NOTE — Telephone Encounter (Signed)
Spoke with pts spouse. She is concerned that pt is thinking backward. 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 She's also concerned with his dizziness. Spoke with pt and he is been having dizziness for 1-2 weeks when he stands up. Pt's heart medication was doubled 1 weeks ago. Pt is also concerned with his knees that feels like they want to give out due to his age per pt. Pt was following back up on on if he should or shouldn't return to work. Pt didn't give a clear answer as to if he was ready to go back to work when asked three times. Pt stated the dizziness he's having.

## 2020-01-20 ENCOUNTER — Telehealth: Payer: Self-pay | Admitting: Cardiovascular Disease

## 2020-01-20 ENCOUNTER — Telehealth: Payer: Self-pay | Admitting: Internal Medicine

## 2020-01-20 MED ORDER — NADOLOL 40 MG PO TABS: 60.0000 mg | ORAL_TABLET | Freq: Every day | ORAL | 3 refills | Status: DC

## 2020-01-20 NOTE — Telephone Encounter (Signed)
Spoke with pt. Pt is aware of EG recommendations of not returning to work at this time and needs to speak with cardiology about weakness/BP to discuss possible med change if needed.

## 2020-01-20 NOTE — Telephone Encounter (Signed)
Returned pt's call. She stated that since medication increase on 01/13/20 (nadolol 80 mg) pt has had orthostatic hypotension. He has been very dizzy and weak feeling. His blood pressures have been as low as 104/60 which is very low for him. He has also been back on lasix for past 5 days as he was starting to swell again. Please advise.

## 2020-01-20 NOTE — Telephone Encounter (Signed)
    I suspect the increase in Nadolol along with starting back on Lasix have caused his worsening orthostasis. He had palpitations while on Nadolol 40 mg daily, so would recommend reducing his dosing from 80 mg daily to 60 mg daily. Make sure he has some 40mg  tablets on hand to cut in half. Follow HR and BP with med changes. He should only be taking Lasix as needed for worsening edema or acute weight changes (> 3 lbs overnight or 5 lbs in one week). Would take a few days off from this as may be dehydrated.  Signed, Erma Heritage, PA-C 01/20/2020, 1:28 PM Pager: [phone]

## 2020-01-20 NOTE — Telephone Encounter (Signed)
Please give pt a call concerning recent medication adjustment [phone]

## 2020-01-20 NOTE — Telephone Encounter (Signed)
Pt's wife, [name], called to speak to someone about patient returning to work and his mental confusion. Please call [phone]

## 2020-01-20 NOTE — Telephone Encounter (Signed)
Spoke with pt's wife. Informed her of recommendations. She voiced understanding of plan. She will call with updates on symptoms.

## 2020-01-21 ENCOUNTER — Telehealth: Payer: Self-pay

## 2020-01-21 NOTE — Telephone Encounter (Signed)
See phone note from yesterday 01/20/2020 for documentation.

## 2020-01-21 NOTE — Telephone Encounter (Signed)
Noted  

## 2020-01-21 NOTE — Telephone Encounter (Signed)
Pt's wife, [name], called and said we should be receiving more paperwork for Bradley Field, PA to fill out.  She wants [place] to call and let her know when we receive it.  I told her she could check back tomorrow morning and she said she will do so.

## 2020-01-21 NOTE — Telephone Encounter (Signed)
Noted. Spoke with spouse and she will drop papers off FMLA today or tomorrow.

## 2020-01-21 NOTE — Telephone Encounter (Signed)
Spouse left a message asking for a return call on 01/20/2020. Returned call and spoke with pt's spouse. Pt talked with his cardiologist and pt's Beta blocker was changed to Nadolol 60 mg. Pt was previously on 80 mg of Nadolol. They will call our office with an update on how pts medication works for pt.    EG & Ardis Hughs- pt's spouse is going to bring new paperwork FMLA papers by the office. She is still hoping to know a date that pt can return to work so he can continue to get paid while he's at home. Spouse is aware that per EG, it's not recommended for pt to return to work at this time.

## 2020-01-22 NOTE — Telephone Encounter (Signed)
Copies made for scanning and faxed to [employer] [facility] [employer]. The wife is aware they are ready and said she would come by today before noon to pick up.

## 2020-01-22 NOTE — Telephone Encounter (Signed)
FMLA papers done and given to Susan.  

## 2020-01-26 ENCOUNTER — Telehealth: Payer: Self-pay | Admitting: Neurology

## 2020-01-26 ENCOUNTER — Encounter: Payer: Managed Care, Other (non HMO) | Admitting: Neurology

## 2020-01-26 NOTE — Telephone Encounter (Signed)
Noted, thanks!

## 2020-01-26 NOTE — Telephone Encounter (Signed)
Bradley Miles, [name](wife on DPR) wife has called to inform they have been without power for 3 days, unable to make NCV/EMG today due to still being without power

## 2020-01-26 NOTE — Telephone Encounter (Signed)
Novella Rob: [id number] (exp. 01/25/20 to 07/23/20) patient is scheduled at GI for 01/27/20.

## 2020-01-27 ENCOUNTER — Other Ambulatory Visit: Payer: Managed Care, Other (non HMO)

## 2020-02-01 ENCOUNTER — Telehealth: Payer: Self-pay | Admitting: Internal Medicine

## 2020-02-01 NOTE — Telephone Encounter (Signed)
Pt's wife, [name], called asking if EG needed to see patient before he returns back to work on 02/08/2020. Please advise. [phone]

## 2020-02-01 NOTE — Telephone Encounter (Signed)
Spoke with pt and spouse. Pt is feeling much better, not having the confusion and moving around better than he was previously. Pt would like to return to work on 02/08/2020. Pt will need a letter of approval to return to work on 02/08/2020. Please advise on this.

## 2020-02-01 NOTE — Telephone Encounter (Signed)
If his strength is improved and he's not having confusion on his current regimen, we can provide a letter to return to work on Monday 02/08/20.  Call us for any recurrent problems or worsening symptoms.

## 2020-02-02 NOTE — Telephone Encounter (Signed)
Letter is ready for pickup. Pt's spouse is aware.

## 2020-02-19 ENCOUNTER — Other Ambulatory Visit: Payer: Self-pay

## 2020-02-19 ENCOUNTER — Ambulatory Visit
Admission: RE | Admit: 2020-02-19 | Discharge: 2020-02-19 | Disposition: A | Discharge status: Home / Self Care | Payer: Managed Care, Other (non HMO) | Source: Ambulatory Visit | Attending: Neurology | Admitting: Neurology

## 2020-02-19 DIAGNOSIS — G622 Polyneuropathy due to other toxic agents: Secondary | ICD-10-CM

## 2020-02-19 DIAGNOSIS — R55 Syncope and collapse: Secondary | ICD-10-CM | POA: Diagnosis not present

## 2020-02-21 ENCOUNTER — Telehealth: Payer: Self-pay | Admitting: Neurology

## 2020-02-21 NOTE — Telephone Encounter (Signed)
I called the patient. The MRI of the brain is relatively unremarkable , minimal WM disease is noted.    EMG study is pending.   MRI brain 02/20/20:  IMPRESSION: This MRI of the brain without contrast shows the following: 1.    Few scattered T2/FLAIR hyperintense foci in the hemispheres consistent with minimal chronic microvascular ischemic changes.  None of the foci appear to be acute. 2.    Normal flow voids are noted within the major intracerebral arteries. 3.    Chronic pansinusitis. 4.    There are no acute findings.

## 2020-03-07 ENCOUNTER — Encounter: Payer: Managed Care, Other (non HMO) | Admitting: Neurology

## 2020-03-28 ENCOUNTER — Other Ambulatory Visit: Payer: Self-pay | Admitting: Family Medicine

## 2020-04-04 ENCOUNTER — Other Ambulatory Visit: Payer: Self-pay

## 2020-04-04 ENCOUNTER — Telehealth: Payer: Self-pay | Admitting: Family Medicine

## 2020-04-04 ENCOUNTER — Ambulatory Visit: Payer: Managed Care, Other (non HMO) | Admitting: Family Medicine

## 2020-04-04 ENCOUNTER — Encounter: Payer: Self-pay | Admitting: Family Medicine

## 2020-04-04 VITALS — BP 130/80 | HR 70 | Temp 97.2°F | Ht 73.0 in | Wt 264.6 lb

## 2020-04-04 DIAGNOSIS — E1165 Type 2 diabetes mellitus with hyperglycemia: Secondary | ICD-10-CM

## 2020-04-04 DIAGNOSIS — R350 Frequency of micturition: Secondary | ICD-10-CM

## 2020-04-04 DIAGNOSIS — F1029 Alcohol dependence with unspecified alcohol-induced disorder: Secondary | ICD-10-CM

## 2020-04-04 DIAGNOSIS — R739 Hyperglycemia, unspecified: Secondary | ICD-10-CM | POA: Diagnosis not present

## 2020-04-04 LAB — POCT GLUCOSE (DEVICE FOR HOME USE): Glucose Fasting, POC: 233 mg/dL — AB (ref 70–99)

## 2020-04-04 LAB — POCT URINALYSIS DIPSTICK
Glucose, UA: POSITIVE — AB
Spec Grav, UA: 1.015 (ref 1.010–1.025)
pH, UA: 6 (ref 5.0–8.0)

## 2020-04-04 LAB — POCT GLYCOSYLATED HEMOGLOBIN (HGB A1C): Hemoglobin A1C: 6.8 % — AB (ref 4.0–5.6)

## 2020-04-04 MED ORDER — METFORMIN HCL ER 500 MG PO TB24: 500.0000 mg | ORAL_TABLET | Freq: Every day | ORAL | 1 refills | Status: DC

## 2020-04-04 MED ORDER — NALTREXONE HCL 50 MG PO TABS: ORAL_TABLET | ORAL | 3 refills | Status: DC

## 2020-04-04 NOTE — Telephone Encounter (Signed)
Patient was seen this morning and needing a work excuse to return to work on 4/30

## 2020-04-04 NOTE — Telephone Encounter (Signed)
Please get patient a work note to return on 4/30.

## 2020-04-04 NOTE — Patient Instructions (Addendum)
Naltrexone tablets What is this medicine? NALTREXONE (nal TREX one) helps you to remain free of your dependence on opiate drugs or alcohol. It blocks the 'high' that these substances can give you. This medicine is combined with counseling and support groups. This medicine may be used for other purposes; ask your health care provider or pharmacist if you have questions. COMMON BRAND NAME(S): Depade, ReVia What should I tell my health care provider before I take this medicine? They need to know if you have any of these conditions:  if you have used drugs or alcohol within 7 to 10 days  kidney disease  liver disease, including hepatitis  an unusual or allergic reaction to naltrexone, other medicines, foods, dyes, or preservatives  pregnant or trying to get pregnant  breast-feeding How should I use this medicine? Take this medicine by mouth with a full glass of water. Follow the directions on the prescription label. Do not take this medicine within 7 to 10 days of taking any opioid drugs. Take your medicine at regular intervals. Do not take your medicine more often than directed. Do not stop taking except on your doctor's advice. Talk to your pediatrician regarding the use of this medicine in children. Special care may be needed. Overdosage: If you think you have taken too much of this medicine contact a poison control center or emergency room at once. NOTE: This medicine is only for you. Do not share this medicine with others. What if I miss a dose? If you miss a dose and remember on the same day, take the missed dose. If you do not remember until the next day, ask your doctor or health care professional about rescheduling your doses. Do not take double or extra doses. What may interact with this medicine? Do not take this medicine with any of the following medications:  any prescription or street opioid drug like codiene, heroin, methadone This medicine may also interact with the  following medications:  disulfiram  thioridazine This list may not describe all possible interactions. Give your health care provider a list of all the medicines, herbs, non-prescription drugs, or dietary supplements you use. Also tell them if you smoke, drink alcohol, or use illegal drugs. Some items may interact with your medicine. What should I watch for while using this medicine? Your condition will be monitored carefully while you are receiving this medicine. Visit your doctor or health care professional regularly. For this medicine to be most effective you should attend any counseling or support groups that your doctor or health care professional recommends. Do not try to overcome the effects of the medicine by taking large amounts of narcotics or by drinking large amounts of alcohol. This can cause severe problems including death. Also, you may be more sensitive to lower doses of narcotics after you stop taking this medicine. If you are going to have surgery, tell your doctor or health care professional that you are taking this medicine. Do not treat yourself for coughs, colds, pain, or diarrhea. Ask your doctor or health care professional for advice. Some of the ingredients may interact with this medicine and cause side effects. Wear a medical ID bracelet or chain, and carry a card that describes your disease and details of your medicine and dosage times. You may get drowsy or dizzy. Do not drive, use machinery, or do anything that needs mental alertness until you know how this medicine affects you. Do not stand or sit up quickly, especially if you are an older patient.  This reduces the risk of dizzy or fainting spells. Alcohol may interfere with the effect of this medicine. Avoid alcoholic drinks. What side effects may I notice from receiving this medicine? Side effects that you should report to your doctor or health care professional as soon as possible:  allergic reactions like skin rash,  itching or hives, swelling of the face, lips, or tongue  breathing problems  changes in vision, hearing  confusion  dark urine  depressed mood  diarrhea  fast or irregular heart beat  hallucination, loss of contact with reality  light-colored stools  right upper belly pain  suicidal thoughts or other mood changes  unusually weak or tired  vomiting  yellowing of the eyes or skin Side effects that usually do not require medical attention (report to your doctor or health care professional if they continue or are bothersome):  aches, pains  change in sex drive or performance  feeling anxious  headache  loss of appetite, nausea  runny nose, sinus problems, sneezing  stomach pain  trouble sleeping This list may not describe all possible side effects. Call your doctor for medical advice about side effects. You may report side effects to FDA at 1-800-FDA-1088. Where should I keep my medicine? Keep out of the reach of children. Store at room temperature between 20 and 25 degrees C (68 and 77 degrees F). Throw away any unused medicine after the expiration date. NOTE: This sheet is a summary. It may not cover all possible information. If you have questions about this medicine, talk to your doctor, pharmacist, or health care provider.  2020 Elsevier/Gold Standard (2012-09-18 10:33:18)  Diabetes Mellitus and Nutrition, Adult When you have diabetes (diabetes mellitus), it is very important to have healthy eating habits because your blood sugar (glucose) levels are greatly affected by what you eat and drink. Eating healthy foods in the appropriate amounts, at about the same times every day, can help you:  Control your blood glucose.  Lower your risk of heart disease.  Improve your blood pressure.  Reach or maintain a healthy weight. Every person with diabetes is different, and each person has different needs for a meal plan. Your health care provider may recommend  that you work with a diet and nutrition specialist (dietitian) to make a meal plan that is best for you. Your meal plan may vary depending on factors such as:  The calories you need.  The medicines you take.  Your weight.  Your blood glucose, blood pressure, and cholesterol levels.  Your activity level.  Other health conditions you have, such as heart or kidney disease. How do carbohydrates affect me? Carbohydrates, also called carbs, affect your blood glucose level more than any other type of food. Eating carbs naturally raises the amount of glucose in your blood. Carb counting is a method for keeping track of how many carbs you eat. Counting carbs is important to keep your blood glucose at a healthy level, especially if you use insulin or take certain oral diabetes medicines. It is important to know how many carbs you can safely have in each meal. This is different for every person. Your dietitian can help you calculate how many carbs you should have at each meal and for each snack. Foods that contain carbs include:  Bread, cereal, rice, pasta, and crackers.  Potatoes and corn.  Peas, beans, and lentils.  Milk and yogurt.  Fruit and juice.  Desserts, such as cakes, cookies, ice cream, and candy. How does alcohol affect  me? Alcohol can cause a sudden decrease in blood glucose (hypoglycemia), especially if you use insulin or take certain oral diabetes medicines. Hypoglycemia can be a life-threatening condition. Symptoms of hypoglycemia (sleepiness, dizziness, and confusion) are similar to symptoms of having too much alcohol. If your health care provider says that alcohol is safe for you, follow these guidelines:  Limit alcohol intake to no more than 1 drink per day for nonpregnant women and 2 drinks per day for men. One drink equals 12 oz of beer, 5 oz of wine, or 1 oz of hard liquor.  Do not drink on an empty stomach.  Keep yourself hydrated with water, diet soda, or  unsweetened iced tea.  Keep in mind that regular soda, juice, and other mixers may contain a lot of sugar and must be counted as carbs. What are tips for following this plan?  Reading food labels  Start by checking the serving size on the "Nutrition Facts" label of packaged foods and drinks. The amount of calories, carbs, fats, and other nutrients listed on the label is based on one serving of the item. Many items contain more than one serving per package.  Check the total grams (g) of carbs in one serving. You can calculate the number of servings of carbs in one serving by dividing the total carbs by 15. For example, if a food has 30 g of total carbs, it would be equal to 2 servings of carbs.  Check the number of grams (g) of saturated and trans fats in one serving. Choose foods that have low or no amount of these fats.  Check the number of milligrams (mg) of salt (sodium) in one serving. Most people should limit total sodium intake to less than 2,300 mg per day.  Always check the nutrition information of foods labeled as "low-fat" or "nonfat". These foods may be higher in added sugar or refined carbs and should be avoided.  Talk to your dietitian to identify your daily goals for nutrients listed on the label. Shopping  Avoid buying canned, premade, or processed foods. These foods tend to be high in fat, sodium, and added sugar.  Shop around the outside edge of the grocery store. This includes fresh fruits and vegetables, bulk grains, fresh meats, and fresh dairy. Cooking  Use low-heat cooking methods, such as baking, instead of high-heat cooking methods like deep frying.  Cook using healthy oils, such as olive, canola, or sunflower oil.  Avoid cooking with butter, cream, or high-fat meats. Meal planning  Eat meals and snacks regularly, preferably at the same times every day. Avoid going long periods of time without eating.  Eat foods high in fiber, such as fresh fruits,  vegetables, beans, and whole grains. Talk to your dietitian about how many servings of carbs you can eat at each meal.  Eat 4-6 ounces (oz) of lean protein each day, such as lean meat, chicken, fish, eggs, or tofu. One oz of lean protein is equal to: ? 1 oz of meat, chicken, or fish. ? 1 egg. ?  cup of tofu.  Eat some foods each day that contain healthy fats, such as avocado, nuts, seeds, and fish. Lifestyle  Check your blood glucose regularly.  Exercise regularly as told by your health care provider. This may include: ? 150 minutes of moderate-intensity or vigorous-intensity exercise each week. This could be brisk walking, biking, or water aerobics. ? Stretching and doing strength exercises, such as yoga or weightlifting, at least 2 times a week.  Take medicines as told by your health care provider.  Do not use any products that contain nicotine or tobacco, such as cigarettes and e-cigarettes. If you need help quitting, ask your health care provider.  Work with a Social worker or diabetes educator to identify strategies to manage stress and any emotional and social challenges. Questions to ask a health care provider  Do I need to meet with a diabetes educator?  Do I need to meet with a dietitian?  What number can I call if I have questions?  When are the best times to check my blood glucose? Where to find more information:  American Diabetes Association: diabetes.org  Academy of Nutrition and Dietetics: www.eatright.[employer] of Diabetes and Digestive and Kidney Diseases (NIH): [employer] Summary  A healthy meal plan will help you control your blood glucose and maintain a healthy lifestyle.  Working with a diet and nutrition specialist (dietitian) can help you make a meal plan that is best for you.  Keep in mind that carbohydrates (carbs) and alcohol have immediate effects on your blood glucose levels. It is important to count carbs and to use alcohol  carefully. This information is not intended to replace advice given to you by your health care provider. Make sure you discuss any questions you have with your health care provider. Document Revised: 11/08/2017 Document Reviewed: 12/31/2016 Elsevier Patient Education  2020 [employer].

## 2020-04-04 NOTE — Progress Notes (Signed)
Patient ID: Bradley Miles, male    DOB: 1966/03/02, 54 y.o.   MRN: [id number]   Chief Complaint  Patient presents with  . increased thirst  . Urinary Frequency   Subjective:   HPI  HPI Pt stating having increased thirst and urination over past 5 days.  Has h/o alcoholic cirrhosis and recent hospitalization for hepatic encephalopathy and alcohol abuse.  Recent hospitalization with 10 days and in ICU for hepatic encephalopathy in 12/20.  Pt wanting to have the visit with wife on phone today. Pt stating has been drinking for about 3 wks per wife (on phone during visit) Pt stating has went to 30 day program for long term facility for rehab 2x for alcohol abuse.  Pt stating it worked for about 90 days each time before going back to drinking. Used to go to [facility].  Pt stating naltrexone didn't help in past.  But stopped taking it then went back to drinking.  Per wife it was prescribed for him to help with alcohol abuse.  Pt and wife state he's not depressed or anxious.  Has good family, job, support.  But wife feels it is his way of life since teens to drink alcohol.  Hasn't seen psychiatry in past for this.   BG today at 233.  A1c at 6.8.    Medical History Bradley Miles has a past medical history of Anxiety, Enlarged liver, GERD (gastroesophageal reflux disease), Hypertension, Palpitations, PVC's (premature ventricular contractions), Shortness of breath, Tussive syncope (12/22/2019), and Varicose veins.   Outpatient Encounter Medications as of 04/04/2020  Medication Sig  . sildenafil (VIAGRA) 100 MG tablet TAKE 1/2 TABLET BY MOUTH EVERY DAY  . allopurinol (ZYLOPRIM) 100 MG tablet TAKE 1 TABLET BY MOUTH EVERY DAY  . DEXILANT 60 MG capsule TAKE 1 CAPSULE BY MOUTH EVERY DAY  . famotidine (PEPCID) 10 MG tablet Take 10 mg by mouth as needed for heartburn or indigestion.  . ferrous sulfate 325 (65 FE) MG EC tablet Take 1 tablet (325 mg total) by mouth 2 (two) times daily.  . furosemide  (LASIX) 40 MG tablet Take 1 tablet (40 mg total) by mouth daily as needed for edema. Take Daily for 5 Days, Then Take Daily Only As Needed for Edema  . gabapentin (NEURONTIN) 600 MG tablet Take 1 tablet (600 mg total) by mouth 3 (three) times daily.  Marland Kitchen lactulose, encephalopathy, (CHRONULAC) 10 GM/15ML SOLN Take 15-30cc three times daily to Titrate for 3-4 soft bowel movements a day (Patient taking differently: Take 15-30cc 1-2 daily to Titrate for 3-4 soft bowel movements a day)  . LORazepam (ATIVAN) 1 MG tablet TAKE 1/2 TABLET BY MOUTH TWICE DAILY AS NEEDED FOR ANXIETY  . metFORMIN (GLUCOPHAGE XR) 500 MG 24 hr tablet Take 1 tablet (500 mg total) by mouth daily with breakfast.  . Multiple Vitamin (MULTIVITAMIN WITH MINERALS) TABS tablet Take 1 tablet by mouth daily.  . nadolol (CORGARD) 40 MG tablet Take 1.5 tablets (60 mg total) by mouth daily.  . naltrexone (DEPADE) 50 MG tablet Take 1 tablet p.o. daily and on every 6th day take 2 tablets.  . ondansetron (ZOFRAN-ODT) 4 MG disintegrating tablet DISSOLVE 1 TABLET ON THE TONGUE EVERY 8 HOURS AS NEEDED FOR NAUSEA  . PARoxetine (PAXIL) 20 MG tablet TAKE 1 TABLET(20 MG) BY MOUTH DAILY  . potassium chloride SA (KLOR-CON) 20 MEQ tablet Take 1 tablet (20 mEq total) by mouth daily as needed (Take when taking Lasix).  . rifaximin (XIFAXAN) 550 MG  TABS tablet Take 1 tablet (550 mg total) by mouth 2 (two) times daily.  . sucralfate (CARAFATE) 1 g tablet TAKE 1 TABLET BY MOUTH BEFORE MEALS (Patient taking differently: Take 1 g by mouth daily. TAKE 1 TABLET BY MOUTH BEFORE MEALS)   No facility-administered encounter medications on file as of 04/04/2020.   Results for orders placed or performed in visit on 04/04/20  POCT Glucose (Device for Home Use)  Result Value Ref Range   Glucose Fasting, POC 233 (A) 70 - 99 mg/dL   POC Glucose    POCT glycosylated hemoglobin (Hb A1C)  Result Value Ref Range   Hemoglobin A1C 6.8 (A) 4.0 - 5.6 %   HbA1c POC (<> result,  manual entry)     HbA1c, POC (prediabetic range)     HbA1c, POC (controlled diabetic range)    POCT urinalysis dipstick  Result Value Ref Range   Color, UA     Clarity, UA     Glucose, UA Positive (A) Negative   Bilirubin, UA     Ketones, UA     Spec Grav, UA 1.015 1.010 - 1.025   Blood, UA     pH, UA 6.0 5.0 - 8.0   Protein, UA     Urobilinogen, UA     Nitrite, UA     Leukocytes, UA     Appearance     Odor      Results for orders placed or performed in visit on 04/04/20  POCT Glucose (Device for Home Use)  Result Value Ref Range   Glucose Fasting, POC 233 (A) 70 - 99 mg/dL   POC Glucose    POCT glycosylated hemoglobin (Hb A1C)  Result Value Ref Range   Hemoglobin A1C 6.8 (A) 4.0 - 5.6 %   HbA1c POC (<> result, manual entry)     HbA1c, POC (prediabetic range)     HbA1c, POC (controlled diabetic range)    POCT urinalysis dipstick  Result Value Ref Range   Color, UA     Clarity, UA     Glucose, UA Positive (A) Negative   Bilirubin, UA     Ketones, UA     Spec Grav, UA 1.015 1.010 - 1.025   Blood, UA     pH, UA 6.0 5.0 - 8.0   Protein, UA     Urobilinogen, UA     Nitrite, UA     Leukocytes, UA     Appearance     Odor      Review of Systems  Constitutional: Positive for fatigue. Negative for chills and fever.  HENT: Negative for congestion, rhinorrhea and sore throat.   Respiratory: Negative for cough, shortness of breath and wheezing.   Cardiovascular: Negative for chest pain and leg swelling.  Gastrointestinal: Negative for abdominal pain, diarrhea, nausea and vomiting.  Endocrine: Positive for polydipsia and polyuria.  Genitourinary: Positive for frequency. Negative for dysuria.  Skin: Negative for rash.  Neurological: Negative for dizziness, weakness and headaches.     Vitals BP 130/80   Pulse 70   Temp (!) 97.2 F (36.2 C) (Oral)   Ht 6' 1"  (1.854 m)   Wt 264 lb 9.6 oz (120 kg)   SpO2 96%   BMI 34.91 kg/m   Objective:   Physical  Exam Constitutional:      General: He is not in acute distress.    Appearance: Normal appearance. He is not ill-appearing or toxic-appearing.  HENT:     Head: Normocephalic.  Nose: Nose normal. No congestion.     Mouth/Throat:     Mouth: Mucous membranes are moist.     Pharynx: No oropharyngeal exudate.  Eyes:     Extraocular Movements: Extraocular movements intact.     Conjunctiva/sclera: Conjunctivae normal.     Pupils: Pupils are equal, round, and reactive to light.  Cardiovascular:     Rate and Rhythm: Normal rate and regular rhythm.     Pulses: Normal pulses.     Heart sounds: Normal heart sounds. No murmur.  Pulmonary:     Effort: Pulmonary effort is normal.     Breath sounds: Normal breath sounds. No wheezing, rhonchi or rales.  Musculoskeletal:        General: Normal range of motion.     Right lower leg: No edema.     Left lower leg: No edema.  Skin:    General: Skin is warm and dry.     Findings: No rash.     Comments: No diaphoresis  Neurological:     General: No focal deficit present.     Mental Status: He is alert and oriented to person, place, and time.     Cranial Nerves: No cranial nerve deficit.     Sensory: No sensory deficit.     Motor: No weakness.     Gait: Gait normal.     Comments: No hand tremors  Psychiatric:        Mood and Affect: Mood normal.        Behavior: Behavior normal.        Thought Content: Thought content normal.        Judgment: Judgment normal.     Comments: Not seeing hallucinations      Assessment and Plan   1. Type 2 diabetes mellitus with hyperglycemia, without long-term current use of insulin (HCC) - metFORMIN (GLUCOPHAGE XR) 500 MG 24 hr tablet; Take 1 tablet (500 mg total) by mouth daily with breakfast.  Dispense: 90 tablet; Refill: 1 - CMP14+EGFR - CBC - Hepatic function panel  2. Urinary frequency - POCT Glucose (Device for Home Use) - POCT urinalysis dipstick  3. Hyperglycemia - POCT glycosylated  hemoglobin (Hb A1C)  4. Alcohol dependence with unspecified alcohol-induced disorder (HCC) - naltrexone (DEPADE) 50 MG tablet; Take 1 tablet p.o. daily and on every 6th day take 2 tablets.  Dispense: 35 tablet; Refill: 3  New onset DM2-  A1c 6.8 and bg in office at 233.  UA- small glucose, no ketones. Start metformin daily.  Reviewed side effects and dec carb intake and alcohol intake.    Alcohol abuse- not in active withdrawal. VS stable. No tremors or hallucinations.  Advising to take ativan as directed if quitting alcohol.  Pt to  restart naltrexone.  Pt just quit drinking yesterday.  Discussed going back to AA, getting support.  Avoiding alcohol, pt needs to take ativan daily for next 2-3 days if not drinking alcohol to avoid withdrawing.  Labs and consult notes reviewed and last notes since Jan '21 reviewed.  Labs ordered today to recheck cbc, cmp, hepatic enzymes.  F/u in 2 wks for recheck.

## 2020-04-05 ENCOUNTER — Encounter: Payer: Self-pay | Admitting: Family Medicine

## 2020-04-05 ENCOUNTER — Telehealth: Payer: Self-pay | Admitting: Family Medicine

## 2020-04-05 LAB — CBC
Hematocrit: 47.9 % (ref 37.5–51.0)
Hemoglobin: 16.8 g/dL (ref 13.0–17.7)
MCH: 35.4 pg — ABNORMAL HIGH (ref 26.6–33.0)
MCHC: 35.1 g/dL (ref 31.5–35.7)
MCV: 101 fL — ABNORMAL HIGH (ref 79–97)
Platelets: 88 10*3/uL — CL (ref 150–450)
RBC: 4.74 x10E6/uL (ref 4.14–5.80)
RDW: 14.4 % (ref 11.6–15.4)
WBC: 4.6 10*3/uL (ref 3.4–10.8)

## 2020-04-05 LAB — CMP14+EGFR
ALT: 16 IU/L (ref 0–44)
AST: 38 IU/L (ref 0–40)
Albumin/Globulin Ratio: 1 — ABNORMAL LOW (ref 1.2–2.2)
Albumin: 3.5 g/dL — ABNORMAL LOW (ref 3.8–4.9)
Alkaline Phosphatase: 157 IU/L — ABNORMAL HIGH (ref 39–117)
BUN/Creatinine Ratio: 8 — ABNORMAL LOW (ref 9–20)
BUN: 7 mg/dL (ref 6–24)
Bilirubin Total: 2.2 mg/dL — ABNORMAL HIGH (ref 0.0–1.2)
CO2: 26 mmol/L (ref 20–29)
Calcium: 9 mg/dL (ref 8.7–10.2)
Chloride: 98 mmol/L (ref 96–106)
Creatinine, Ser: 0.85 mg/dL (ref 0.76–1.27)
GFR calc Af Amer: 117 mL/min/{1.73_m2} (ref >59)
GFR calc non Af Amer: 101 mL/min/{1.73_m2} (ref >59)
Globulin, Total: 3.4 g/dL (ref 1.5–4.5)
Glucose: 199 mg/dL — ABNORMAL HIGH (ref 65–99)
Potassium: 4 mmol/L (ref 3.5–5.2)
Sodium: 140 mmol/L (ref 134–144)
Total Protein: 6.9 g/dL (ref 6.0–8.5)

## 2020-04-05 LAB — HEPATIC FUNCTION PANEL: Bilirubin, Direct: 0.54 mg/dL — ABNORMAL HIGH (ref 0.00–0.40)

## 2020-04-05 NOTE — Telephone Encounter (Signed)
Patient was seen yesterday and said we were going to send over a prescription to get a machine to check his sugars.  Was calling to check on that.  Michela Pitcher it can be sent to [facility] on [address] or to [place] if [facility] do that.

## 2020-04-05 NOTE — Telephone Encounter (Signed)
See other message in result notes. Dr Lovena Le said to hold off right now and will discuss at visit in 2 weeks and I called and let pt know. He verbalized understanding.

## 2020-04-18 ENCOUNTER — Other Ambulatory Visit: Payer: Self-pay | Admitting: Family Medicine

## 2020-04-18 NOTE — Telephone Encounter (Signed)
Seen 04/04/20 for hospital follow up

## 2020-04-21 ENCOUNTER — Encounter: Payer: Self-pay | Admitting: Neurology

## 2020-04-21 ENCOUNTER — Encounter: Payer: Self-pay | Admitting: Family Medicine

## 2020-04-21 ENCOUNTER — Ambulatory Visit: Payer: Managed Care, Other (non HMO) | Admitting: Neurology

## 2020-04-21 ENCOUNTER — Other Ambulatory Visit: Payer: Self-pay

## 2020-04-21 ENCOUNTER — Ambulatory Visit (INDEPENDENT_AMBULATORY_CARE_PROVIDER_SITE_OTHER): Payer: Managed Care, Other (non HMO) | Admitting: Family Medicine

## 2020-04-21 VITALS — BP 144/80 | HR 97 | Temp 97.1°F | Ht 73.0 in | Wt 264.0 lb

## 2020-04-21 DIAGNOSIS — E118 Type 2 diabetes mellitus with unspecified complications: Secondary | ICD-10-CM | POA: Diagnosis not present

## 2020-04-21 DIAGNOSIS — Z7141 Alcohol abuse counseling and surveillance of alcoholic: Secondary | ICD-10-CM

## 2020-04-21 DIAGNOSIS — K7031 Alcoholic cirrhosis of liver with ascites: Secondary | ICD-10-CM | POA: Diagnosis not present

## 2020-04-21 MED ORDER — BLOOD GLUCOSE METER KIT: PACK | 5 refills | Status: DC

## 2020-04-21 NOTE — Progress Notes (Signed)
Patient ID: Bradley Miles, male    DOB: Aug 18, 1966, 54 y.o.   MRN: [id number]   Chief Complaint  Patient presents with  . Results  . Alcohol Problem  . Hepatic Disease   Subjective:    HPI  pt arrives for a follow up on recurrent alcohol abuse, liver disease, alcohol treatment and medications. Pt stating "not doing so well with drinking this past weekend."  Last saturday-drank 2 fifths of liquor. Nancy Fetter- felt bad didn't drink, vomiting from being hung over. mon- vomiting bile.  No coffee ground emesis. tues- felt better.  Some rt sided pain and some nausea. Wed- had 4 of the 6 oz bottles 13 % alcohol.  thurs-patient stating he has not drank anything yet today.  Tried the naltrexone and stopped it bc wanting to drink. Not sure if helped. Take 4-5 one day, then 3, 2, 1.  Very sleeping sun and Monday. Not taking it as much as should.  Has family support. Wife is a retired Marine scientist. Patient admits in the past he used to drink while at work.  He is stating he is not doing that currently.  Patient stating he does not drink in the mornings at this time.  But does drink after work. Patient stating that he used to go to [employer] and has been in [employer] in the past for alcohol. Has not been consistent with staying sober greater than 90 days after rehab. Patient denies being anxious or depressed.  He states he does have significant work stressors.  He says he has good support at home.  Patient does not seem to have much desire to quit drinking, and does not really know the underlying reason or want to discuss it for why he does drink so much.  Patient tried naltrexone but stating that he stopped taking it a few days before wanting to drink on the weekends.  Patient not sure if naltrexone is working or not.  Patient stating he does not want to drink liquor any longer that it is too unbearable, but is wanting to continue to drink high percentage of alcohol drinks in  general, with malt liquor/ beer.  Medical History Kendyl has a past medical history of Anxiety, Enlarged liver, GERD (gastroesophageal reflux disease), Hypertension, Palpitations, PVC's (premature ventricular contractions), Shortness of breath, Tussive syncope (12/22/2019), and Varicose veins.   Outpatient Encounter Medications as of 04/21/2020  Medication Sig  . allopurinol (ZYLOPRIM) 100 MG tablet TAKE 1 TABLET BY MOUTH EVERY DAY  . DEXILANT 60 MG capsule TAKE 1 CAPSULE BY MOUTH EVERY DAY  . famotidine (PEPCID) 10 MG tablet Take 10 mg by mouth as needed for heartburn or indigestion.  . ferrous sulfate 325 (65 FE) MG EC tablet Take 1 tablet (325 mg total) by mouth 2 (two) times daily.  . furosemide (LASIX) 40 MG tablet Take 1 tablet (40 mg total) by mouth daily as needed for edema. Take Daily for 5 Days, Then Take Daily Only As Needed for Edema  . gabapentin (NEURONTIN) 600 MG tablet Take 1 tablet (600 mg total) by mouth 3 (three) times daily.  Marland Kitchen lactulose, encephalopathy, (CHRONULAC) 10 GM/15ML SOLN Take 15-30cc three times daily to Titrate for 3-4 soft bowel movements a day (Patient taking differently: Take 15-30cc 1-2 daily to Titrate for 3-4 soft bowel movements a day)  . LORazepam (ATIVAN) 1 MG tablet TAKE 1/2 TABLET BY MOUTH TWICE DAILY AS NEEDED FOR ANXIETY  . metFORMIN (GLUCOPHAGE XR) 500 MG 24 hr tablet  Take 1 tablet (500 mg total) by mouth daily with breakfast.  . Multiple Vitamin (MULTIVITAMIN WITH MINERALS) TABS tablet Take 1 tablet by mouth daily.  . nadolol (CORGARD) 40 MG tablet Take 1.5 tablets (60 mg total) by mouth daily.  . naltrexone (DEPADE) 50 MG tablet Take 1 tablet p.o. daily and on every 6th day take 2 tablets.  . ondansetron (ZOFRAN-ODT) 4 MG disintegrating tablet DISSOLVE 1 TABLET ON THE TONGUE EVERY 8 HOURS AS NEEDED FOR NAUSEA  . PARoxetine (PAXIL) 20 MG tablet TAKE 1 TABLET(20 MG) BY MOUTH DAILY  . potassium chloride SA (KLOR-CON) 20 MEQ tablet Take 1 tablet (20 mEq  total) by mouth daily as needed (Take when taking Lasix).  . rifaximin (XIFAXAN) 550 MG TABS tablet Take 1 tablet (550 mg total) by mouth 2 (two) times daily.  . sildenafil (VIAGRA) 100 MG tablet TAKE 1/2 TABLET BY MOUTH EVERY DAY  . sucralfate (CARAFATE) 1 g tablet TAKE 1 TABLET BY MOUTH BEFORE MEALS (Patient taking differently: Take 1 g by mouth daily. TAKE 1 TABLET BY MOUTH BEFORE MEALS)  . blood glucose meter kit and supplies Dispense based on patient and insurance preference. Test blood sugar once daily.  (FOR ICD-10 E10.9, E11.9).   No facility-administered encounter medications on file as of 04/21/2020.     Review of Systems  Constitutional: Negative for chills and fever.  HENT: Negative for congestion, rhinorrhea and sore throat.   Respiratory: Negative for cough, shortness of breath and wheezing.   Cardiovascular: Negative for chest pain and leg swelling.  Gastrointestinal: Positive for nausea. Negative for abdominal pain, diarrhea and vomiting.  Genitourinary: Negative for dysuria and frequency.  Skin: Negative for rash.  Neurological: Negative for dizziness, weakness and headaches.     Vitals BP (!) 144/80   Pulse 97   Temp (!) 97.1 F (36.2 C)   Ht 6' 1"  (1.854 m)   Wt 264 lb (119.7 kg)   SpO2 95%   BMI 34.83 kg/m   Objective:   Physical Exam Vitals and nursing note reviewed.  Constitutional:      General: He is not in acute distress.    Appearance: Normal appearance. He is not ill-appearing or toxic-appearing.  HENT:     Head: Normocephalic.     Mouth/Throat:     Mouth: Mucous membranes are moist.     Pharynx: No oropharyngeal exudate.  Eyes:     Extraocular Movements: Extraocular movements intact.     Conjunctiva/sclera: Conjunctivae normal.     Pupils: Pupils are equal, round, and reactive to light.  Cardiovascular:     Rate and Rhythm: Normal rate and regular rhythm.     Pulses: Normal pulses.     Heart sounds: Normal heart sounds. No murmur.    Pulmonary:     Effort: Pulmonary effort is normal.     Breath sounds: Normal breath sounds. No wheezing, rhonchi or rales.  Abdominal:     General: Bowel sounds are normal. There is no distension.     Palpations: Abdomen is soft. There is no mass.     Tenderness: There is abdominal tenderness (mild ttp rt upper quad). There is no guarding or rebound.     Hernia: No hernia is present.  Musculoskeletal:        General: Normal range of motion.     Right lower leg: No edema.     Left lower leg: No edema.     Comments: No hand tremor   Skin:  General: Skin is warm and dry.     Findings: No rash.  Neurological:     General: No focal deficit present.     Mental Status: He is alert and oriented to person, place, and time.     Cranial Nerves: No cranial nerve deficit.     Sensory: No sensory deficit.     Motor: No weakness.     Gait: Gait normal.  Psychiatric:        Mood and Affect: Mood normal.        Behavior: Behavior normal.      Assessment and Plan   1. Alcoholic cirrhosis of liver with ascites ([facility]) - Ambulatory referral to Psychology  2. Alcohol abuse counseling and surveillance of alcoholic - Ambulatory referral to Psychology  3. Controlled type 2 diabetes mellitus with complication, without long-term current use of insulin ([facility]) - blood glucose meter kit and supplies; Dispense based on patient and insurance preference. Test blood sugar once daily.  (FOR ICD-10 E10.9, E11.9).  Dispense: 1 each; Refill: 5    Long discussion regarding the course of continued alcohol abuse and liver damage.  Discussed risk versus benefits of continuing to abuse alcohol.  Patient not wanting to consistently take naltrexone.  Patient only taking Ativan as needed if not drinking alcohol.  Patient was given a taper of alcohol over 1 week.  And to use Ativan as needed on the days where he is decreasing his alcohol intake and having shaking, palpitations, sweats or racing heart rate.  Discussed  need for patient to follow-up with counseling to help with support for abstaining from alcohol.  Patient advised to call or return if worsening symptoms.  Patient given referral for counseling, advised to go to [place] meetings, advised to call us or use family for support as needed.  Also gave patient information for outpatient alcohol treatment program at [facility]  Greater than 30 minutes spent counseling patient, discussing course of his disease, coordinating care, reviewing labs, ordering glucometer.  Follow-up in 2 months for recheck.

## 2020-04-21 NOTE — Progress Notes (Deleted)
PATIENT: Bradley Miles DOB: [birth year]  REASON FOR VISIT: follow up HISTORY FROM: patient  HISTORY OF PRESENT ILLNESS: Today 04/21/20  Mr. Lipsky is a 54 year old male with history of peripheral neuropathy, alcohol abuse, and tussive syncope.  MRI of the brain was relatively unremarkable, minimal white matter disease is noted EMG nerve conduction study has yet to be completed.  He is on gabapentin for neuropathy.  HISTORY 12/22/2019 Dr. Jannifer Franklin: Mr. Damaso is a 54 year old white male with a history of alcohol abuse.  The patient has had documented problems with hyperammonemia associated with encephalopathy, his ammonia level has been as high as 183 in the past.  He has been treated with Xifaxan and lactulose.  He has continued to drink until 18 November 2019.  He was admitted to the hospital on 19 November 2019 with recurring vomiting and hematemesis.  The patient required a portacaval shunt, he did have some increased confusion around the time of the GI bleed.  An ammonia level on 28 November 2019 was 51.  Upper limits of normal for this lab is 35.  The patient has not had any more confusion since the hospitalization.  He believes that his cognitive functioning is normal.  He reports a 6-year history of some problems with numbness of the feet and some numbness of the hands as well.  He reports a tingling sensation in the feet that is somewhat worse when he is up on his feet, he feels better lying down.  The patient denies any weakness of the extremities, he denies any back pain or pain down the legs.  The patient mainly has sensory alteration to just above the ankles.  He denies issues controlling the bowels or the bladder.  He denies any severe balance problems, he has not had any falls.  He also reports a history of tussive syncope over the last 6 or 7 years.  He has had less than 10 such events over that timeframe.  The last such event occurred 4 or 5 months ago.  All episodes of syncope have  been associated with smoking, he smokes 1/2 pack cigarettes daily.  He will get into an episode of severe prolonged coughing and then he may have a brief episode of syncope.  The patient is sent to this office for an evaluation.   REVIEW OF SYSTEMS: Out of a complete 14 system review of symptoms, the patient complains only of the following symptoms, and all other reviewed systems are negative.  ALLERGIES: No Known Allergies  HOME MEDICATIONS: Outpatient Medications Prior to Visit  Medication Sig Dispense Refill  . sildenafil (VIAGRA) 100 MG tablet TAKE 1/2 TABLET BY MOUTH EVERY DAY 3 tablet 3  . allopurinol (ZYLOPRIM) 100 MG tablet TAKE 1 TABLET BY MOUTH EVERY DAY 30 tablet 5  . DEXILANT 60 MG capsule TAKE 1 CAPSULE BY MOUTH EVERY DAY 30 capsule 3  . famotidine (PEPCID) 10 MG tablet Take 10 mg by mouth as needed for heartburn or indigestion.    . ferrous sulfate 325 (65 FE) MG EC tablet Take 1 tablet (325 mg total) by mouth 2 (two) times daily. 60 tablet 3  . furosemide (LASIX) 40 MG tablet Take 1 tablet (40 mg total) by mouth daily as needed for edema. Take Daily for 5 Days, Then Take Daily Only As Needed for Edema 30 tablet 11  . gabapentin (NEURONTIN) 600 MG tablet Take 1 tablet (600 mg total) by mouth 3 (three) times daily. 270 tablet 1  .  lactulose, encephalopathy, (CHRONULAC) 10 GM/15ML SOLN Take 15-30cc three times daily to Titrate for 3-4 soft bowel movements a day (Patient taking differently: Take 15-30cc 1-2 daily to Titrate for 3-4 soft bowel movements a day) 1892 mL 1  . LORazepam (ATIVAN) 1 MG tablet TAKE 1/2 TABLET BY MOUTH TWICE DAILY AS NEEDED FOR ANXIETY 30 tablet 1  . metFORMIN (GLUCOPHAGE XR) 500 MG 24 hr tablet Take 1 tablet (500 mg total) by mouth daily with breakfast. 90 tablet 1  . Multiple Vitamin (MULTIVITAMIN WITH MINERALS) TABS tablet Take 1 tablet by mouth daily.    . nadolol (CORGARD) 40 MG tablet Take 1.5 tablets (60 mg total) by mouth daily. 135 tablet 3  .  naltrexone (DEPADE) 50 MG tablet Take 1 tablet p.o. daily and on every 6th day take 2 tablets. 35 tablet 3  . ondansetron (ZOFRAN-ODT) 4 MG disintegrating tablet DISSOLVE 1 TABLET ON THE TONGUE EVERY 8 HOURS AS NEEDED FOR NAUSEA 24 tablet 2  . PARoxetine (PAXIL) 20 MG tablet TAKE 1 TABLET(20 MG) BY MOUTH DAILY 30 tablet 5  . potassium chloride SA (KLOR-CON) 20 MEQ tablet Take 1 tablet (20 mEq total) by mouth daily as needed (Take when taking Lasix). 30 tablet 11  . rifaximin (XIFAXAN) 550 MG TABS tablet Take 1 tablet (550 mg total) by mouth 2 (two) times daily. 60 tablet 0  . sucralfate (CARAFATE) 1 g tablet TAKE 1 TABLET BY MOUTH BEFORE MEALS (Patient taking differently: Take 1 g by mouth daily. TAKE 1 TABLET BY MOUTH BEFORE MEALS) 90 tablet 3   No facility-administered medications prior to visit.    PAST MEDICAL HISTORY: Past Medical History:  Diagnosis Date  . Anxiety   . Enlarged liver   . GERD (gastroesophageal reflux disease)   . Hypertension   . Palpitations   . PVC's (premature ventricular contractions)   . Shortness of breath   . Tussive syncope 12/22/2019  . Varicose veins     PAST SURGICAL HISTORY: Past Surgical History:  Procedure Laterality Date  . BIOPSY  02/02/2019   Procedure: BIOPSY;  Surgeon: Daneil Dolin, MD;  [facility];  Service: Endoscopy;;  gastric  . COLONOSCOPY WITH ESOPHAGOGASTRODUODENOSCOPY (EGD)  12/16/2012   internal hemorrhoids, colonic diverticulosis, benign polyps, screening in 2024. EGD with mild erosive reflux esophagitis, small hiatal hernia, negative H.pylori  . cyst reomved     from spine  . ESOPHAGOGASTRODUODENOSCOPY  05/19/09   RMR: Geographic distal esophageal erosions consistent with severe erosive reflux esophagitis. schatzki's ring s/P dilation/small hiatal hernia otherwise normal stomach  . ESOPHAGOGASTRODUODENOSCOPY (EGD) WITH PROPOFOL N/A 02/02/2019   Dr. Gala Romney: retained gastric contents. portal HTN gastropathy  .  ESOPHAGOGASTRODUODENOSCOPY (EGD) WITH PROPOFOL N/A 11/20/2019   Procedure: ESOPHAGOGASTRODUODENOSCOPY (EGD) WITH PROPOFOL;  Surgeon: Daneil Dolin, MD;  [facility];  Service: Endoscopy;  Laterality: N/A;  . ESOPHAGOGASTRODUODENOSCOPY (EGD) WITH PROPOFOL N/A 11/24/2019   Procedure: ESOPHAGOGASTRODUODENOSCOPY (EGD) WITH PROPOFOL;  Surgeon: Ronnette Juniper, MD;  Location: [place];  Service: Gastroenterology;  Laterality: N/A;  . IR ANGIOGRAM SELECTIVE EACH ADDITIONAL VESSEL  11/22/2019  . IR ANGIOGRAM SELECTIVE EACH ADDITIONAL VESSEL  11/24/2019  . IR ANGIOGRAM SELECTIVE EACH ADDITIONAL VESSEL  11/24/2019  . IR ANGIOGRAM SELECTIVE EACH ADDITIONAL VESSEL  11/24/2019  . IR EMBO ART  VEN HEMORR LYMPH EXTRAV  INC GUIDE ROADMAPPING  11/22/2019  . IR EMBO ART  VEN HEMORR LYMPH EXTRAV  INC GUIDE ROADMAPPING  11/24/2019  . IR RADIOLOGIST EVAL & MGMT  12/29/2019  . IR TIPS  11/24/2019  . IR US GUIDE VASC ACCESS RIGHT  11/22/2019  . IR VENOGRAM RENAL UNI LEFT  11/22/2019  . RADIOLOGY WITH ANESTHESIA N/A 11/24/2019   Procedure: IR WITH ANESTHESIA;  Surgeon: Radiologist, Medication, MD;  Location: [place];  Service: Radiology;  Laterality: N/A;  . TIPS PROCEDURE N/A 11/22/2019   Procedure: BRTO/TRANS-JUGULAR INTRAHEPATIC PORTAL SHUNT (TIPS);  Surgeon: Corrie Mckusick, DO;  Location: [place];  Service: Anesthesiology;  Laterality: N/A;  . WISDOM TOOTH EXTRACTION      FAMILY HISTORY: Family History  Problem Relation Age of Onset  . Cancer Mother        ? etiology  . Heart disease Mother   . Colon polyps Sister 38       two sisters  . Brain cancer Father   . Liver disease Neg Hx     SOCIAL HISTORY: Social History   Socioeconomic History  . Marital status: Married    Spouse name: Not on file  . Number of children: 2  . Years of education: 80  . Highest education level: High school graduate  Occupational History  . Occupation: [employer]    Comment: Lawyer    Tobacco Use  . Smoking status: Current Every Day Smoker    Packs/day: 1.00    Years: 30.00    Pack years: 30.00    Types: Cigarettes    Start date: 12/24/1979  . Smokeless tobacco: Never Used  Substance and Sexual Activity  . Alcohol use: Yes    Alcohol/week: 0.0 standard drinks    Comment: Quit drinking 11/18/2019 - previous reports -drinks two 5th/daily  . Drug use: No  . Sexual activity: Yes    Partners: Female    Birth control/protection: None  Other Topics Concern  . Not on file  Social History Narrative   Lives at home with family.   Right-handed.   No daily use of caffeine.   Social Determinants of Health   Financial Resource Strain:   . Difficulty of Paying Living Expenses:   Food Insecurity:   . Worried About Charity fundraiser in the Last Year:   . Arboriculturist in the Last Year:   Transportation Needs:   . Film/video editor (Medical):   Marland Kitchen Lack of Transportation (Non-Medical):   Physical Activity:   . Days of Exercise per Week:   . Minutes of Exercise per Session:   Stress:   . Feeling of Stress :   Social Connections:   . Frequency of Communication with Friends and Family:   . Frequency of Social Gatherings with Friends and Family:   . Attends Religious Services:   . Active Member of Clubs or Organizations:   . Attends Archivist Meetings:   Marland Kitchen Marital Status:   Intimate Partner Violence:   . Fear of Current or Ex-Partner:   . Emotionally Abused:   Marland Kitchen Physically Abused:   . Sexually Abused:       PHYSICAL EXAM  There were no vitals filed for this visit. There is no height or weight on file to calculate BMI.  Generalized: Well developed, in no acute distress   Neurological examination  Mentation: Alert oriented to time, place, history taking. Follows all commands speech and language fluent Cranial nerve II-XII: Pupils were equal round reactive to light. Extraocular movements were full, visual field were full on confrontational  test. Facial sensation and strength were normal. Uvula tongue midline. Head turning  and shoulder shrug  were normal and symmetric. Motor: The motor testing reveals 5 over 5 strength of all 4 extremities. Good symmetric motor tone is noted throughout.  Sensory: Sensory testing is intact to soft touch on all 4 extremities. No evidence of extinction is noted.  Coordination: Cerebellar testing reveals good finger-nose-finger and heel-to-shin bilaterally.  Gait and station: Gait is normal. Tandem gait is normal. Romberg is negative. No drift is seen.  Reflexes: Deep tendon reflexes are symmetric and normal bilaterally.   DIAGNOSTIC DATA (LABS, IMAGING, TESTING) - I reviewed patient records, labs, notes, testing and imaging myself where available.  Lab Results  Component Value Date   WBC 4.6 04/04/2020   HGB 16.8 04/04/2020   HCT 47.9 04/04/2020   MCV 101 (H) 04/04/2020   PLT 88 (LL) 04/04/2020      Component Value Date/Time   NA 140 04/04/2020 1139   K 4.0 04/04/2020 1139   CL 98 04/04/2020 1139   CO2 26 04/04/2020 1139   GLUCOSE 199 (H) 04/04/2020 1139   GLUCOSE 116 (H) 11/29/2019 0306   BUN 7 04/04/2020 1139   CREATININE 0.85 04/04/2020 1139   CREATININE 0.74 08/19/2019 1304   CALCIUM 9.0 04/04/2020 1139   PROT 6.9 04/04/2020 1139   ALBUMIN 3.5 (L) 04/04/2020 1139   AST 38 04/04/2020 1139   ALT 16 04/04/2020 1139   ALKPHOS 157 (H) 04/04/2020 1139   BILITOT 2.2 (H) 04/04/2020 1139   GFRNONAA 101 04/04/2020 1139   GFRAA 117 04/04/2020 1139   Lab Results  Component Value Date   CHOL 219 (H) 03/17/2014   HDL 51 03/17/2014   LDLCALC 124 (H) 03/17/2014   TRIG 221 (H) 03/17/2014   CHOLHDL 4.3 03/17/2014   Lab Results  Component Value Date   HGBA1C 6.8 (A) 04/04/2020   Lab Results  Component Value Date   VITAMINB12 1,214 12/22/2019   Lab Results  Component Value Date   TSH 9.034 (H) 06/01/2018      ASSESSMENT AND PLAN 54 y.o. year old male  has a past medical  history of Anxiety, Enlarged liver, GERD (gastroesophageal reflux disease), Hypertension, Palpitations, PVC's (premature ventricular contractions), Shortness of breath, Tussive syncope (12/22/2019), and Varicose veins. here with ***   I spent 15 minutes with the patient. 50% of this time was spent   Butler Denmark, [place], DNP 04/21/2020, 5:35 AM [facility] [address], [place], [place] [address] [phone]

## 2020-04-26 ENCOUNTER — Encounter: Payer: Self-pay | Admitting: Family Medicine

## 2020-04-27 MED ORDER — SITAGLIPTIN PHOSPHATE 25 MG PO TABS: 25.0000 mg | ORAL_TABLET | Freq: Every day | ORAL | 1 refills | Status: DC

## 2020-04-27 NOTE — Addendum Note (Signed)
Addended by: Erven Colla on: 04/27/2020 11:14 AM   Modules accepted: Orders

## 2020-04-27 NOTE — Telephone Encounter (Signed)
Discussed with pt. Pt verbalized understanding.  °

## 2020-04-27 NOTE — Telephone Encounter (Signed)
Left message to return call 

## 2020-04-28 ENCOUNTER — Telehealth: Payer: Self-pay | Admitting: Internal Medicine

## 2020-04-28 NOTE — Telephone Encounter (Signed)
Please call patient wife about patients medication, he was diagnosed with diabetes and put on medication and she has concerns about interactions. [phone]

## 2020-04-28 NOTE — Telephone Encounter (Signed)
Spoke with pts spouse. Pt has a new dx of diabetes. Pts PCP Dr. Lovena Le with [facility] has placed pt on Januvia and they are asking if that medication will be ok for pt take with pt prior conditions?

## 2020-04-29 ENCOUNTER — Telehealth: Payer: Self-pay | Admitting: Family Medicine

## 2020-04-29 ENCOUNTER — Telehealth: Payer: Self-pay | Admitting: Internal Medicine

## 2020-04-29 NOTE — Telephone Encounter (Signed)
Pt's wife called today and said she spoke with AM yesterday about the patient being put on a new diabetic medicine and has concerns about him taking it along with his GI history. She wants to know if RMR is comfortable with him taking it. I asked if she had talked to his PCP or whomever prescribed medication and she said yes, but wanted RMR input as well. I told AM was off today and will be back Monday and there's a note for RMR to address. She is wanting to know something before the weekend. Please advise and call her at [phone]

## 2020-04-29 NOTE — Telephone Encounter (Signed)
PA attempted for Januvia 25 mg. Await decision.

## 2020-04-29 NOTE — Telephone Encounter (Signed)
Please tell them that I looked the medication up. Based on the status of his liver disease, there's no dosing changes needed based on his liver disease. There is a very rare possibility of his liver enzymes increasing, she PCP and [place] will need to keep an eye on these. Overall, I think it should be fine.  Let me know if any other questions

## 2020-05-02 NOTE — Telephone Encounter (Signed)
Spoke with pts souse. She was notified of EG recommendations. She wants to know specifically if the medication is ok due to pts pancreatitis. I told pts spouse EG recommendations and she said did EG say specifically that with his hx of pancreatitis, this medication will be ok. Spouse is aware that EG is out of the office today.

## 2020-05-02 NOTE — Telephone Encounter (Signed)
Message was previously sent to EG. See other phone note for documentation.

## 2020-05-04 ENCOUNTER — Telehealth: Payer: Self-pay | Admitting: *Deleted

## 2020-05-04 ENCOUNTER — Other Ambulatory Visit: Payer: Self-pay | Admitting: *Deleted

## 2020-05-04 MED ORDER — SITAGLIPTIN PHOSPHATE 25 MG PO TABS: 25.0000 mg | ORAL_TABLET | Freq: Every day | ORAL | 0 refills | Status: DC

## 2020-05-04 NOTE — Telephone Encounter (Signed)
Pt called to check on status of prior auth. I called insurance because we have not heard back and it was approved. Pt can fill 30 day at [facility] for $50 copay or [facility]. [facility] and it did go through for $50. I called pt and he did want to do [facility] day [facility] and [facility]. [facility].

## 2020-05-04 NOTE — Telephone Encounter (Signed)
Approved. Pt and pharm notified.

## 2020-05-05 NOTE — Telephone Encounter (Signed)
There is a unspecified frequency of pancreatitis with Januvia (post-marketing; based on someone stating they had pancreatitis while on Januvia). No indication for how common. No contraindications for Januvia with history of pancreatitis. No Black Box warning.  Overall, I feel it should be fine. Can discuss concerns with PCP. If he develops abdominal pain, N/V while on Januvia, stop the medication and call his PCP or our office.

## 2020-05-05 NOTE — Telephone Encounter (Signed)
Left a detailed message on spouses machine. She can call back with any questions or concerns.

## 2020-05-10 ENCOUNTER — Encounter: Payer: Self-pay | Admitting: Internal Medicine

## 2020-05-18 ENCOUNTER — Other Ambulatory Visit: Payer: Self-pay | Admitting: Family Medicine

## 2020-05-19 NOTE — Telephone Encounter (Signed)
Please advise. Pt has upcoming appt in July. Thank you

## 2020-05-23 ENCOUNTER — Telehealth: Payer: Self-pay | Admitting: Family Medicine

## 2020-05-23 DIAGNOSIS — K7031 Alcoholic cirrhosis of liver with ascites: Secondary | ICD-10-CM

## 2020-05-23 DIAGNOSIS — Z79899 Other long term (current) drug therapy: Secondary | ICD-10-CM

## 2020-05-23 DIAGNOSIS — E1165 Type 2 diabetes mellitus with hyperglycemia: Secondary | ICD-10-CM

## 2020-05-23 DIAGNOSIS — I10 Essential (primary) hypertension: Secondary | ICD-10-CM

## 2020-05-23 NOTE — Telephone Encounter (Signed)
Pt had labs completed on 04/04/20 (CMP, CBC and Hepatic). Please advise. Thank you

## 2020-05-23 NOTE — Telephone Encounter (Signed)
Wife-[name] is requesting labs done on patient due to all his health issues and problems.She states he has started back drinking.He has 2 month follow up on 06/21/20. Please advise

## 2020-05-25 NOTE — Telephone Encounter (Signed)
Lab orders placed and pt wife [name] (on [place]) is aware.

## 2020-05-25 NOTE — Telephone Encounter (Signed)
Yes pls reorder those labs and add a1c. Thx. Dr. Darene Lamer

## 2020-05-26 ENCOUNTER — Other Ambulatory Visit: Payer: Self-pay | Admitting: Family Medicine

## 2020-05-27 NOTE — Telephone Encounter (Signed)
Checkup on 04/21/20

## 2020-06-21 ENCOUNTER — Ambulatory Visit (INDEPENDENT_AMBULATORY_CARE_PROVIDER_SITE_OTHER): Payer: Managed Care, Other (non HMO) | Admitting: Family Medicine

## 2020-06-21 ENCOUNTER — Encounter: Payer: Self-pay | Admitting: Family Medicine

## 2020-06-21 ENCOUNTER — Other Ambulatory Visit: Payer: Self-pay

## 2020-06-21 VITALS — BP 130/74 | Temp 97.4°F | Ht 73.0 in | Wt 264.6 lb

## 2020-06-21 DIAGNOSIS — R945 Abnormal results of liver function studies: Secondary | ICD-10-CM

## 2020-06-21 DIAGNOSIS — K7031 Alcoholic cirrhosis of liver with ascites: Secondary | ICD-10-CM

## 2020-06-21 DIAGNOSIS — E118 Type 2 diabetes mellitus with unspecified complications: Secondary | ICD-10-CM | POA: Diagnosis not present

## 2020-06-21 DIAGNOSIS — D696 Thrombocytopenia, unspecified: Secondary | ICD-10-CM

## 2020-06-21 DIAGNOSIS — G621 Alcoholic polyneuropathy: Secondary | ICD-10-CM

## 2020-06-21 DIAGNOSIS — R7989 Other specified abnormal findings of blood chemistry: Secondary | ICD-10-CM

## 2020-06-21 LAB — CBC WITH DIFFERENTIAL/PLATELET
Basophils Absolute: 0.1 10*3/uL (ref 0.0–0.2)
Basos: 2 %
EOS (ABSOLUTE): 0.2 10*3/uL (ref 0.0–0.4)
Eos: 5 %
Hematocrit: 48.1 % (ref 37.5–51.0)
Hemoglobin: 18.2 g/dL — ABNORMAL HIGH (ref 13.0–17.7)
Immature Grans (Abs): 0 10*3/uL (ref 0.0–0.1)
Immature Granulocytes: 0 %
Lymphocytes Absolute: 0.7 10*3/uL (ref 0.7–3.1)
Lymphs: 18 %
MCH: 38.6 pg — ABNORMAL HIGH (ref 26.6–33.0)
MCHC: 37.8 g/dL — ABNORMAL HIGH (ref 31.5–35.7)
MCV: 102 fL — ABNORMAL HIGH (ref 79–97)
Monocytes Absolute: 0.6 10*3/uL (ref 0.1–0.9)
Monocytes: 15 %
Neutrophils Absolute: 2.4 10*3/uL (ref 1.4–7.0)
Neutrophils: 60 %
Platelets: 57 10*3/uL — CL (ref 150–450)
RBC: 4.72 x10E6/uL (ref 4.14–5.80)
RDW: 14.1 % (ref 11.6–15.4)
WBC: 3.9 10*3/uL (ref 3.4–10.8)

## 2020-06-21 LAB — HEPATIC FUNCTION PANEL: Bilirubin, Direct: 1.52 mg/dL — ABNORMAL HIGH (ref 0.00–0.40)

## 2020-06-21 LAB — COMPREHENSIVE METABOLIC PANEL
ALT: 46 IU/L — ABNORMAL HIGH (ref 0–44)
AST: 165 IU/L — ABNORMAL HIGH (ref 0–40)
Albumin/Globulin Ratio: 0.8 — ABNORMAL LOW (ref 1.2–2.2)
Albumin: 3.2 g/dL — ABNORMAL LOW (ref 3.8–4.9)
Alkaline Phosphatase: 287 IU/L — ABNORMAL HIGH (ref 48–121)
BUN/Creatinine Ratio: 4 — ABNORMAL LOW (ref 9–20)
BUN: 3 mg/dL — ABNORMAL LOW (ref 6–24)
Bilirubin Total: 3.7 mg/dL — ABNORMAL HIGH (ref 0.0–1.2)
CO2: 30 mmol/L — ABNORMAL HIGH (ref 20–29)
Calcium: 8.6 mg/dL — ABNORMAL LOW (ref 8.7–10.2)
Chloride: 99 mmol/L (ref 96–106)
Creatinine, Ser: 0.8 mg/dL (ref 0.76–1.27)
GFR calc Af Amer: 120 mL/min/{1.73_m2} (ref >59)
GFR calc non Af Amer: 103 mL/min/{1.73_m2} (ref >59)
Globulin, Total: 4.2 g/dL (ref 1.5–4.5)
Glucose: 144 mg/dL — ABNORMAL HIGH (ref 65–99)
Potassium: 4.1 mmol/L (ref 3.5–5.2)
Sodium: 143 mmol/L (ref 134–144)
Total Protein: 7.4 g/dL (ref 6.0–8.5)

## 2020-06-21 LAB — HEMOGLOBIN A1C
Est. average glucose Bld gHb Est-mCnc: 151 mg/dL
Hgb A1c MFr Bld: 6.9 % — ABNORMAL HIGH (ref 4.8–5.6)

## 2020-06-21 MED ORDER — LACTULOSE ENCEPHALOPATHY 10 GM/15ML PO SOLN: ORAL | 1 refills | Status: DC

## 2020-06-21 NOTE — Progress Notes (Signed)
Patient ID: MASAHIRO IGLESIA, male    DOB: 1966/09/12, 54 y.o.   MRN: [id number]   Chief Complaint  Patient presents with  . Follow-up   Subjective:    HPI  pt arrives for a follow up on alcohol abuse, liver disease and depression. Pt seen with wife.  Not having any concerns since last visit.    However this AM tried to vomit this am, some dry heaves. Having small amt of cramping in lower abd.   Needing a refill lactulose.  Seeing PA- eric. At Dr. Gala Romney office.  Hasn't seen them yet for follow up.  [facility], supposed to go to [facility] for vacation in a few weeks.  Pt stating wanting to go after his vacation to [facility]. Thinking about going back to rehab. 28 days for rehab program.  Neuropathy taking gabapentin and drinking. And has severe neuropathy and saw neurology.  Was seeing Dr. Jannifer Franklin, neurologist.  Has mucous on left eye.  Started 1 month ago. Got better with drops. Jaundice in both eyes. Was having swollen lids at times. Tobramycin drops in the past and helped with the discharge.  Pt didn't take the naltrexone and stopped taking it to be able to drink.  Medical History Odin has a past medical history of Anxiety, Controlled type 2 diabetes mellitus with complication, without long-term current use of insulin ([place]) (06/26/2020), Enlarged liver, GERD (gastroesophageal reflux disease), Hypertension, Palpitations, PVC's (premature ventricular contractions), Shortness of breath, Tussive syncope (12/22/2019), and Varicose veins.   Outpatient Encounter Medications as of 06/21/2020  Medication Sig  . allopurinol (ZYLOPRIM) 100 MG tablet TAKE 1 TABLET BY MOUTH EVERY DAY  . blood glucose meter kit and supplies Dispense based on patient and insurance preference. Test blood sugar once daily.  (FOR ICD-10 E10.9, E11.9).  . DEXILANT 60 MG capsule TAKE 1 CAPSULE BY MOUTH EVERY DAY  . ferrous sulfate 325 (65 FE) MG EC tablet Take 1  tablet (325 mg total) by mouth 2 (two) times daily.  . furosemide (LASIX) 40 MG tablet Take 1 tablet (40 mg total) by mouth daily as needed for edema. Take Daily for 5 Days, Then Take Daily Only As Needed for Edema  . gabapentin (NEURONTIN) 600 MG tablet Take 1 tablet (600 mg total) by mouth 3 (three) times daily.  Marland Kitchen lactulose, encephalopathy, (CHRONULAC) 10 GM/15ML SOLN Take 15-30cc 1-2 daily to Titrate for 3-4 soft bowel movements a day  . Multiple Vitamin (MULTIVITAMIN WITH MINERALS) TABS tablet Take 1 tablet by mouth daily.  . nadolol (CORGARD) 40 MG tablet Take 1.5 tablets (60 mg total) by mouth daily.  . naltrexone (DEPADE) 50 MG tablet Take 1 tablet p.o. daily and on every 6th day take 2 tablets.  . ondansetron (ZOFRAN-ODT) 4 MG disintegrating tablet DISSOLVE 1 TABLET ON THE TONGUE EVERY 8 HOURS AS NEEDED FOR NAUSEA  . PARoxetine (PAXIL) 20 MG tablet TAKE 1 TABLET(20 MG) BY MOUTH DAILY  . potassium chloride SA (KLOR-CON) 20 MEQ tablet Take 1 tablet (20 mEq total) by mouth daily as needed (Take when taking Lasix).  . sildenafil (VIAGRA) 100 MG tablet TAKE 1/2 TABLET BY MOUTH EVERY DAY  . sitaGLIPtin (JANUVIA) 25 MG tablet Take 1 tablet (25 mg total) by mouth daily.  . sucralfate (CARAFATE) 1 g tablet Take 1 tablet (1 g total) by mouth daily. TAKE 1 TABLET BY MOUTH BEFORE MEALS  . traZODone (DESYREL) 50 MG tablet TAKE 1 TABLET BY  MOUTH AT BEDTIME AS NEEDED FOR INSOMNIA  . [DISCONTINUED] famotidine (PEPCID) 10 MG tablet Take 10 mg by mouth as needed for heartburn or indigestion.  . [DISCONTINUED] lactulose, encephalopathy, (CHRONULAC) 10 GM/15ML SOLN Take 15-30cc three times daily to Titrate for 3-4 soft bowel movements a day (Patient taking differently: Take 15-30cc 1-2 daily to Titrate for 3-4 soft bowel movements a day)  . [DISCONTINUED] LORazepam (ATIVAN) 1 MG tablet TAKE 1/2 TABLET BY MOUTH TWICE DAILY AS NEEDED FOR ANXIETY  . [DISCONTINUED] metFORMIN (GLUCOPHAGE XR) 500 MG 24 hr tablet  Take 1 tablet (500 mg total) by mouth daily with breakfast.  . [DISCONTINUED] rifaximin (XIFAXAN) 550 MG TABS tablet Take 1 tablet (550 mg total) by mouth 2 (two) times daily.   No facility-administered encounter medications on file as of 06/21/2020.     Review of Systems  Constitutional: Negative for chills and fever.  HENT: Negative for congestion, rhinorrhea and sore throat.   Eyes: Positive for discharge (left eye).  Respiratory: Negative for cough, shortness of breath and wheezing.   Cardiovascular: Negative for chest pain and leg swelling.  Gastrointestinal: Negative for abdominal pain, diarrhea, nausea and vomiting.  Genitourinary: Negative for dysuria and frequency.  Skin: Negative for rash.  Neurological: Negative for dizziness, weakness and headaches.     Vitals BP 130/74   Temp (!) 97.4 F (36.3 C)   Ht _0  (1.854 m)   Wt 264 lb 9.6 oz (120 kg)   BMI 34.91 kg/m   Objective:   Physical Exam Vitals and nursing note reviewed.  Constitutional:      General: He is not in acute distress.    Appearance: Normal appearance. He is not ill-appearing.  HENT:     Head: Normocephalic.     Nose: Nose normal. No congestion.     Mouth/Throat:     Mouth: Mucous membranes are moist.     Pharynx: No oropharyngeal exudate.  Eyes:     General: Scleral icterus present.     Extraocular Movements: Extraocular movements intact.     Conjunctiva/sclera: Conjunctivae normal.     Pupils: Pupils are equal, round, and reactive to light.  Cardiovascular:     Rate and Rhythm: Normal rate and regular rhythm.     Pulses: Normal pulses.     Heart sounds: Normal heart sounds. No murmur heard.   Pulmonary:     Effort: Pulmonary effort is normal.     Breath sounds: Normal breath sounds. No wheezing, rhonchi or rales.  Abdominal:     General: Abdomen is flat. Bowel sounds are normal. There is no distension.     Palpations: Abdomen is soft. There is no mass.     Tenderness: There is no  abdominal tenderness. There is no guarding or rebound.     Hernia: No hernia is present.  Musculoskeletal:        General: Normal range of motion.     Right lower leg: No edema.     Left lower leg: No edema.  Skin:    General: Skin is warm and dry.     Findings: No rash.  Neurological:     General: No focal deficit present.     Mental Status: He is alert and oriented to person, place, and time.     Cranial Nerves: No cranial nerve deficit.  Psychiatric:        Mood and Affect: Mood normal.        Behavior: Behavior normal.  Thought Content: Thought content normal.        Judgment: Judgment normal.      Assessment and Plan   1. Alcoholic cirrhosis of liver with ascites (HCC) - lactulose, encephalopathy, (CHRONULAC) 10 GM/15ML SOLN; Take 15-30cc 1-2 daily to Titrate for 3-4 soft bowel movements a day  Dispense: 473 mL; Refill: 1  2. Abnormal LFTs  3. Alcoholic peripheral neuropathy ([place])  4. Controlled type 2 diabetes mellitus with complication, without long-term current use of insulin ([place])  5. Thrombocytopenia (HCC)  DM2- slight inc in a1c from 6.8 to 6.9 since 4/21.  Cont with [place].   Elevated LFTs/alcoholic cirrhosis/neuropathy- cont f/u with GI, neuro. Pt not ready to stop drinking.  Has been thinking about going back to [facility]. Thinking he may go in the next month.   Pt wife requesting more ativan prn.  I advised not a good idea at this time since pt not stopping drinking.  Not wanting to continue to prescribe this medication while actively heavily drinking.  Script was given for when pt was trying to stop drinking.  Last script for neuropathy was given by Dr. Jannifer Franklin, neuro.  Pt advised to f/u with GI again, since liver enzymes going up and pt continuing to drink alcohol.   Needing to have recheck with GI;  pt and wife in agreement.   re-discussed at length needing to decrease and stop drinking and advised to go to [place] meetings, advised to  call us or use family for support as needed.  Also gave patient information for outpatient alcohol treatment program at [facility].  Pt stating has eye drops at home, will call if needing a refill.   F/u [place] prn.

## 2020-06-21 NOTE — Patient Instructions (Signed)
Results for orders placed or performed in visit on 05/23/20  Hemoglobin A1c  Result Value Ref Range   Hgb A1c MFr Bld 6.9 (H) 4.8 - 5.6 %   Est. average glucose Bld gHb Est-mCnc 151 mg/dL  Comprehensive Metabolic Panel (CMET)  Result Value Ref Range   Glucose 144 (H) 65 - 99 mg/dL   BUN 3 (L) 6 - 24 mg/dL   Creatinine, Ser 0.80 0.76 - 1.27 mg/dL   GFR calc non Af Amer 103 >59 mL/min/1.73   GFR calc Af Amer 120 >59 mL/min/1.73   BUN/Creatinine Ratio 4 (L) 9 - 20   Sodium 143 134 - 144 mmol/L   Potassium 4.1 3.5 - 5.2 mmol/L   Chloride 99 96 - 106 mmol/L   CO2 30 (H) 20 - 29 mmol/L   Calcium 8.6 (L) 8.7 - 10.2 mg/dL   Total Protein 7.4 6.0 - 8.5 g/dL   Albumin 3.2 (L) 3.8 - 4.9 g/dL   Globulin, Total 4.2 1.5 - 4.5 g/dL   Albumin/Globulin Ratio 0.8 (L) 1.2 - 2.2   Bilirubin Total 3.7 (H) 0.0 - 1.2 mg/dL   Alkaline Phosphatase 287 (H) 48 - 121 IU/L   AST 165 (H) 0 - 40 IU/L   ALT 46 (H) 0 - 44 IU/L  CBC with Differential  Result Value Ref Range   WBC 3.9 3.4 - 10.8 x10E3/uL   RBC 4.72 4.14 - 5.80 x10E6/uL   Hemoglobin 18.2 (H) 13.0 - 17.7 g/dL   Hematocrit 48.1 37.5 - 51.0 %   MCV 102 (H) 79 - 97 fL   MCH 38.6 (H) 26.6 - 33.0 pg   MCHC 37.8 (H) 31 - 35 g/dL   RDW 14.1 11.6 - 15.4 %   Platelets 57 (LL) 150 - 450 x10E3/uL   Neutrophils 60 Not Estab. %   Lymphs 18 Not Estab. %   Monocytes 15 Not Estab. %   Eos 5 Not Estab. %   Basos 2 Not Estab. %   Neutrophils Absolute 2.4 1 - 7 x10E3/uL   Lymphocytes Absolute 0.7 0 - 3 x10E3/uL   Monocytes Absolute 0.6 0 - 0 x10E3/uL   EOS (ABSOLUTE) 0.2 0.0 - 0.4 x10E3/uL   Basophils Absolute 0.1 0 - 0 x10E3/uL   Immature Granulocytes 0 Not Estab. %   Immature Grans (Abs) 0.0 0.0 - 0.1 x10E3/uL   Hematology Comments: Note:   Hepatic function panel  Result Value Ref Range   Bilirubin, Direct 1.52 (H) 0.00 - 0.40 mg/dL

## 2020-06-22 ENCOUNTER — Other Ambulatory Visit: Payer: Self-pay | Admitting: Interventional Radiology

## 2020-06-22 DIAGNOSIS — I8501 Esophageal varices with bleeding: Secondary | ICD-10-CM

## 2020-06-26 ENCOUNTER — Encounter: Payer: Self-pay | Admitting: Family Medicine

## 2020-06-26 DIAGNOSIS — E118 Type 2 diabetes mellitus with unspecified complications: Secondary | ICD-10-CM

## 2020-06-26 DIAGNOSIS — E1165 Type 2 diabetes mellitus with hyperglycemia: Secondary | ICD-10-CM | POA: Insufficient documentation

## 2020-06-26 DIAGNOSIS — D696 Thrombocytopenia, unspecified: Secondary | ICD-10-CM | POA: Insufficient documentation

## 2020-06-26 DIAGNOSIS — IMO0002 Reserved for concepts with insufficient information to code with codable children: Secondary | ICD-10-CM | POA: Insufficient documentation

## 2020-06-26 DIAGNOSIS — E119 Type 2 diabetes mellitus without complications: Secondary | ICD-10-CM | POA: Insufficient documentation

## 2020-06-26 HISTORY — DX: Type 2 diabetes mellitus with unspecified complications: E11.8

## 2020-07-17 ENCOUNTER — Other Ambulatory Visit: Payer: Self-pay | Admitting: Family Medicine

## 2020-07-25 ENCOUNTER — Ambulatory Visit: Payer: Managed Care, Other (non HMO) | Admitting: Cardiovascular Disease

## 2020-07-28 NOTE — Progress Notes (Deleted)
Cardiology Office Note:    Date:  07/28/2020   ID:  Bradley Miles, DOB 14-Oct-1966, MRN [id number]  PCP:  Erven Colla, DO  Cardiologist:  Kate Sable, MD (Inactive)   Referring MD: [name], MD   No chief complaint on file.   History of Present Illness:    Bradley Miles is a 54 y.o. male with a hx of palpitations due to PAC's and PVC's, hypertension, DM II, esophageal varices, and cirrhosis.  ***  Past Medical History:  Diagnosis Date   Anxiety    Controlled type 2 diabetes mellitus with complication, without long-term current use of insulin ([name]) 06/26/2020   Enlarged liver    GERD (gastroesophageal reflux disease)    Hypertension    Palpitations    PVC's (premature ventricular contractions)    Shortness of breath    Tussive syncope 12/22/2019   Varicose veins     Past Surgical History:  Procedure Laterality Date   BIOPSY  02/02/2019   Procedure: BIOPSY;  Surgeon: Daneil Dolin, MD;  [facility];  Service: Endoscopy;;  gastric   COLONOSCOPY WITH ESOPHAGOGASTRODUODENOSCOPY (EGD)  12/16/2012   internal hemorrhoids, colonic diverticulosis, benign polyps, screening in 2024. EGD with mild erosive reflux esophagitis, small hiatal hernia, negative H.pylori   cyst reomved     from spine   ESOPHAGOGASTRODUODENOSCOPY  05/19/09   RMR: Geographic distal esophageal erosions consistent with severe erosive reflux esophagitis. schatzki's ring s/P dilation/small hiatal hernia otherwise normal stomach   ESOPHAGOGASTRODUODENOSCOPY (EGD) WITH PROPOFOL N/A 02/02/2019   Dr. Gala Romney: retained gastric contents. portal HTN gastropathy   ESOPHAGOGASTRODUODENOSCOPY (EGD) WITH PROPOFOL N/A 11/20/2019   Procedure: ESOPHAGOGASTRODUODENOSCOPY (EGD) WITH PROPOFOL;  Surgeon: Daneil Dolin, MD;  [facility];  Service: Endoscopy;  Laterality: N/A;   ESOPHAGOGASTRODUODENOSCOPY (EGD) WITH PROPOFOL N/A 11/24/2019   Procedure:  ESOPHAGOGASTRODUODENOSCOPY (EGD) WITH PROPOFOL;  Surgeon: Ronnette Juniper, MD;  Location: [place];  Service: Gastroenterology;  Laterality: N/A;   IR ANGIOGRAM SELECTIVE EACH ADDITIONAL VESSEL  11/22/2019   IR ANGIOGRAM SELECTIVE EACH ADDITIONAL VESSEL  11/24/2019   IR ANGIOGRAM SELECTIVE EACH ADDITIONAL VESSEL  11/24/2019   IR ANGIOGRAM SELECTIVE EACH ADDITIONAL VESSEL  11/24/2019   IR EMBO ART  VEN HEMORR LYMPH EXTRAV  INC GUIDE ROADMAPPING  11/22/2019   IR EMBO ART  VEN HEMORR LYMPH EXTRAV  INC GUIDE ROADMAPPING  11/24/2019   IR RADIOLOGIST EVAL & MGMT  12/29/2019   IR TIPS  11/24/2019   IR US GUIDE VASC ACCESS RIGHT  11/22/2019   IR VENOGRAM RENAL UNI LEFT  11/22/2019   RADIOLOGY WITH ANESTHESIA N/A 11/24/2019   Procedure: IR WITH ANESTHESIA;  Surgeon: Radiologist, Medication, MD;  Location: [place];  Service: Radiology;  Laterality: N/A;   TIPS PROCEDURE N/A 11/22/2019   Procedure: BRTO/TRANS-JUGULAR INTRAHEPATIC PORTAL SHUNT (TIPS);  Surgeon: Corrie Mckusick, DO;  Location: [facility];  Service: Anesthesiology;  Laterality: N/A;   WISDOM TOOTH EXTRACTION      Current Medications: No outpatient medications have been marked as taking for the 07/29/20 encounter (Appointment) with Bradley Crome, MD.     Allergies:   Patient has no known allergies.   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, academic    Comment: Lawyer  Tobacco Use   Smoking status: Current Every Day Smoker    Packs/day: 1.00    Years: 30.00    Pack years: 30.00    Types: Cigarettes    Start date: 12/24/1979   Smokeless tobacco: Never Used  Vaping Use   Vaping Use: Never used  Substance and Sexual Activity   Alcohol use: Yes    Alcohol/week: 0.0 standard drinks    Comment: Quit  drinking 11/18/2019 - previous reports -drinks two 5th/daily   Drug use: No   Sexual activity: Yes    Partners: Female    Birth control/protection: None  Other Topics Concern   Not on file  Social History Narrative   Lives at home with family.   Right-handed.   No daily use of caffeine.   Social Determinants of Health   Financial Resource Strain:    Difficulty of Paying Living Expenses: Not on file  Food Insecurity:    Worried About Charity fundraiser in the Last Year: Not on file   [employer] of Food in the Last Year: Not on file  Transportation Needs:    Lack of Transportation (Medical): Not on file   Lack of Transportation (Non-Medical): Not on file  Physical Activity:    Days of Exercise per Week: Not on file   Minutes of Exercise per Session: Not on file  Stress:    Feeling of Stress : Not on file  Social Connections:    Frequency of Communication with Friends and Family: Not on file   Frequency of Social Gatherings with Friends and Family: Not on file   Attends Religious Services: Not on file   Active Member of Clubs or Organizations: Not on file   Attends Archivist Meetings: Not on file   Marital Status: Not on file     Family History: The patient's family history includes Brain cancer in his father; Cancer in his mother; Colon polyps (age of onset: 36) in his sister; Heart disease in his mother. There is no history of Liver disease.  ROS:   Please see the history of present illness.    *** All other systems reviewed and are negative.  EKGs/Labs/Other Studies Reviewed:    The following studies were reviewed today: ***  EKG:  EKG ***  Recent Labs: 11/25/2019: Magnesium 1.4 06/20/2020: ALT 46; BUN 3; Creatinine, Ser 0.80; Hemoglobin 18.2; Platelets 57; Potassium 4.1; Sodium 143  Recent Lipid Panel    Component Value Date/Time   CHOL 219 (H) 03/17/2014 0801   TRIG 221 (H) 03/17/2014 0801   HDL 51 03/17/2014 0801   CHOLHDL 4.3  03/17/2014 0801   VLDL 44 (H) 03/17/2014 0801   LDLCALC 124 (H) 03/17/2014 0801    Physical Exam:    VS:  There were no vitals taken for this visit.    Wt Readings from Last 3 Encounters:  06/21/20 264 lb 9.6 oz (120 kg)  04/21/20 264 lb (119.7 kg)  04/04/20 264 lb 9.6 oz (120 kg)     GEN: ***. No acute distress HEENT: Normal NECK: No JVD. LYMPHATICS: No lymphadenopathy CARDIAC: *** RRR without murmur, gallop, or edema. VASCULAR: *** Normal Pulses. No bruits. RESPIRATORY:  Clear to auscultation without rales, wheezing or rhonchi  ABDOMEN: Soft, non-tender, non-distended, No pulsatile mass, MUSCULOSKELETAL: No deformity  SKIN: Warm and dry NEUROLOGIC:  Alert and oriented x 3 PSYCHIATRIC:  Normal affect   ASSESSMENT:    1. Essential hypertension   2. Palpitations   3. Bilateral  lower extremity edema   4. Chest pressure   5. Educated about COVID-19 virus infection    PLAN:    In order of problems listed above:  1. ***   Medication Adjustments/Labs and Tests Ordered: Current medicines are reviewed at length with the patient today.  Concerns regarding medicines are outlined above.  No orders of the defined types were placed in this encounter.  No orders of the defined types were placed in this encounter.   There are no Patient Instructions on file for this visit.   Signed, Sinclair Grooms, MD  07/28/2020 8:38 PM    [place]

## 2020-07-29 ENCOUNTER — Ambulatory Visit: Payer: Managed Care, Other (non HMO) | Admitting: Interventional Cardiology

## 2020-07-29 DIAGNOSIS — R0789 Other chest pain: Secondary | ICD-10-CM

## 2020-07-29 DIAGNOSIS — R002 Palpitations: Secondary | ICD-10-CM

## 2020-07-29 DIAGNOSIS — Z7189 Other specified counseling: Secondary | ICD-10-CM

## 2020-07-29 DIAGNOSIS — I10 Essential (primary) hypertension: Secondary | ICD-10-CM

## 2020-07-29 DIAGNOSIS — R600 Localized edema: Secondary | ICD-10-CM

## 2020-08-04 ENCOUNTER — Inpatient Hospital Stay (HOSPITAL_COMMUNITY)
Admission: EM | Admit: 2020-08-04 | Discharge: 2020-08-08 | DRG: 432 | Disposition: A | Discharge status: Home / Self Care | Payer: Managed Care, Other (non HMO) | Attending: Family Medicine | Admitting: Family Medicine

## 2020-08-04 ENCOUNTER — Encounter (HOSPITAL_COMMUNITY): Payer: Self-pay | Admitting: *Deleted

## 2020-08-04 ENCOUNTER — Other Ambulatory Visit: Payer: Self-pay

## 2020-08-04 DIAGNOSIS — K766 Portal hypertension: Secondary | ICD-10-CM | POA: Diagnosis present

## 2020-08-04 DIAGNOSIS — I10 Essential (primary) hypertension: Secondary | ICD-10-CM | POA: Diagnosis present

## 2020-08-04 DIAGNOSIS — F17200 Nicotine dependence, unspecified, uncomplicated: Secondary | ICD-10-CM | POA: Diagnosis present

## 2020-08-04 DIAGNOSIS — K7031 Alcoholic cirrhosis of liver with ascites: Principal | ICD-10-CM | POA: Diagnosis present

## 2020-08-04 DIAGNOSIS — E782 Mixed hyperlipidemia: Secondary | ICD-10-CM | POA: Diagnosis present

## 2020-08-04 DIAGNOSIS — Z808 Family history of malignant neoplasm of other organs or systems: Secondary | ICD-10-CM

## 2020-08-04 DIAGNOSIS — Z8249 Family history of ischemic heart disease and other diseases of the circulatory system: Secondary | ICD-10-CM

## 2020-08-04 DIAGNOSIS — K922 Gastrointestinal hemorrhage, unspecified: Secondary | ICD-10-CM | POA: Diagnosis present

## 2020-08-04 DIAGNOSIS — K2100 Gastro-esophageal reflux disease with esophagitis, without bleeding: Secondary | ICD-10-CM | POA: Diagnosis present

## 2020-08-04 DIAGNOSIS — D696 Thrombocytopenia, unspecified: Secondary | ICD-10-CM | POA: Diagnosis present

## 2020-08-04 DIAGNOSIS — F17210 Nicotine dependence, cigarettes, uncomplicated: Secondary | ICD-10-CM | POA: Diagnosis present

## 2020-08-04 DIAGNOSIS — F1020 Alcohol dependence, uncomplicated: Secondary | ICD-10-CM

## 2020-08-04 DIAGNOSIS — F419 Anxiety disorder, unspecified: Secondary | ICD-10-CM | POA: Diagnosis present

## 2020-08-04 DIAGNOSIS — K7040 Alcoholic hepatic failure without coma: Secondary | ICD-10-CM | POA: Diagnosis present

## 2020-08-04 DIAGNOSIS — I8511 Secondary esophageal varices with bleeding: Secondary | ICD-10-CM | POA: Diagnosis present

## 2020-08-04 DIAGNOSIS — Z8371 Family history of colonic polyps: Secondary | ICD-10-CM

## 2020-08-04 DIAGNOSIS — Z20822 Contact with and (suspected) exposure to covid-19: Secondary | ICD-10-CM | POA: Diagnosis present

## 2020-08-04 DIAGNOSIS — K921 Melena: Secondary | ICD-10-CM | POA: Diagnosis present

## 2020-08-04 DIAGNOSIS — Z79899 Other long term (current) drug therapy: Secondary | ICD-10-CM

## 2020-08-04 DIAGNOSIS — J449 Chronic obstructive pulmonary disease, unspecified: Secondary | ICD-10-CM | POA: Diagnosis present

## 2020-08-04 DIAGNOSIS — K7682 Hepatic encephalopathy: Secondary | ICD-10-CM

## 2020-08-04 DIAGNOSIS — K920 Hematemesis: Secondary | ICD-10-CM | POA: Diagnosis present

## 2020-08-04 DIAGNOSIS — K3189 Other diseases of stomach and duodenum: Secondary | ICD-10-CM | POA: Diagnosis present

## 2020-08-04 DIAGNOSIS — Z9119 Patient's noncompliance with other medical treatment and regimen: Secondary | ICD-10-CM

## 2020-08-04 DIAGNOSIS — E118 Type 2 diabetes mellitus with unspecified complications: Secondary | ICD-10-CM | POA: Diagnosis present

## 2020-08-04 DIAGNOSIS — D62 Acute posthemorrhagic anemia: Secondary | ICD-10-CM | POA: Diagnosis present

## 2020-08-04 DIAGNOSIS — Z95828 Presence of other vascular implants and grafts: Secondary | ICD-10-CM

## 2020-08-04 DIAGNOSIS — R42 Dizziness and giddiness: Secondary | ICD-10-CM | POA: Diagnosis present

## 2020-08-04 DIAGNOSIS — K219 Gastro-esophageal reflux disease without esophagitis: Secondary | ICD-10-CM | POA: Diagnosis present

## 2020-08-04 DIAGNOSIS — I493 Ventricular premature depolarization: Secondary | ICD-10-CM | POA: Diagnosis present

## 2020-08-04 LAB — POC OCCULT BLOOD, ED: Fecal Occult Bld: POSITIVE — AB

## 2020-08-04 MED ORDER — SODIUM CHLORIDE 0.9 % IV SOLN
80.0000 mg | Freq: Once | INTRAVENOUS | Status: AC
  Administered 2020-08-05: 80 mg via INTRAVENOUS
  Filled 2020-08-04: qty 80

## 2020-08-04 MED ORDER — OCTREOTIDE LOAD VIA INFUSION
50.0000 ug | Freq: Once | INTRAVENOUS | Status: AC
  Administered 2020-08-05: 50 ug via INTRAVENOUS
  Filled 2020-08-04: qty 25

## 2020-08-04 MED ORDER — SODIUM CHLORIDE 0.9 % IV SOLN: INTRAVENOUS | Status: DC

## 2020-08-04 MED ORDER — SODIUM CHLORIDE 0.9 % IV SOLN
50.0000 ug/h | INTRAVENOUS | Status: DC
  Administered 2020-08-05 (×2): 50 ug/h via INTRAVENOUS
  Filled 2020-08-04 (×13): qty 1

## 2020-08-04 MED ORDER — ONDANSETRON HCL 4 MG/2ML IJ SOLN
4.0000 mg | Freq: Once | INTRAMUSCULAR | Status: AC
  Administered 2020-08-05: 4 mg via INTRAVENOUS
  Filled 2020-08-04: qty 2

## 2020-08-04 MED ORDER — SODIUM CHLORIDE 0.9 % IV SOLN
8.0000 mg/h | INTRAVENOUS | Status: AC
  Administered 2020-08-05 (×3): 8 mg/h via INTRAVENOUS
  Filled 2020-08-04 (×9): qty 80

## 2020-08-04 MED ORDER — SODIUM CHLORIDE 0.9 % IV BOLUS
1000.0000 mL | Freq: Once | INTRAVENOUS | Status: AC
  Administered 2020-08-05: 1000 mL via INTRAVENOUS

## 2020-08-04 NOTE — ED Provider Notes (Signed)
[facility] EMERGENCY DEPARTMENT Provider Note   CSN: [id number] Arrival date & time: 08/04/20  2206     History Chief Complaint  Patient presents with  . GI Bleeding    Bradley Miles is a 54 y.o. male.  Patient with history of alcohol abuse, cirrhosis, esophageal varices status post TIPS procedure December 2020 here with ongoing alcohol abuse and GI bleeding.  States today around 1:15 PM he noticed some coffee-ground emesis and had 2 episodes of bright red blood in his emesis.  He is also has had dark red-colored stools for unknown period of time.  He is not sure if the stools have been loose or watery or solid.  He states intermittent compliance with medications and continues to drink alcohol.  Denies any blood thinner use.  Denies any chest pain or shortness of breath.  He did feels generally weak and "out of it".  He has diffuse crampy abdominal pain and feeling mildly weak and short of breath.  Denies drinking alcohol today.  Last drink was yesterday.  Admits to feeling shaky if he does not drink alcohol on a regular basis  He is supposed to be taking Dexilant as well as Lasix.  The history is provided by the patient.       Past Medical History:  Diagnosis Date  . Anxiety   . Controlled type 2 diabetes mellitus with complication, without long-term current use of insulin ([place]) 06/26/2020  . Enlarged liver   . GERD (gastroesophageal reflux disease)   . Hypertension   . Palpitations   . PVC's (premature ventricular contractions)   . Shortness of breath   . Tussive syncope 12/22/2019  . Varicose veins     Patient Active Problem List   Diagnosis Date Noted  . Thrombocytopenia ([place]) 06/26/2020  . Controlled type 2 diabetes mellitus with complication, without long-term current use of insulin ([place]) 06/26/2020  . Tussive syncope 12/22/2019  . Acute blood loss anemia   . ETOH abuse   . Bleeding esophageal varices ([place])   . Hematemesis/vomiting blood 11/20/2019  . RUQ pain  07/30/2019  . Abnormal LFTs 07/30/2019  . Bilateral leg edema 06/03/2019  . Portal hypertension ([place]) 11/26/2018  . Hepatic steatosis 06/27/2018  . Abdominal pain   . Alcoholic hepatitis without ascites   . Hepatic encephalopathy ([place]) 06/01/2018  . Acute respiratory failure with hypoxia ([place]) 06/01/2018  . Alcoholic cirrhosis of liver with ascites ([place]) 06/01/2018  . COPD with acute exacerbation ([place]) 06/01/2018  . 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 Increased ammonia level   . COPD, mild ([place]) 05/14/2017  . Right leg claudication ([place]) 05/14/2017  . Prediabetes 02/04/2017  . Chest pain 12/21/2015  . Right leg DVT ([place]) 07/18/2015  . Elevated transaminase measurement 07/18/2015  . Sensory neuropathy 07/16/2015  . Anxiety 12/23/2014  . EtOH dependence ([place]) 02/09/2014  . Headache in back of head 01/12/2014  . Superficial thrombosis of lower extremity 07/01/2013  . Hematochezia 11/17/2012  . Mixed hyperlipidemia 02/15/2012  . Hypertension 01/18/2012  . Insomnia 01/18/2012  . Family history of colonic polyps 11/15/2011  . Palpitations 10/04/2011  . TOBACCO ABUSE 05/18/2010  . GERD 05/18/2010  . CONSTIPATION 05/18/2010  . 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 DYSPHAGIA UNSPECIFIED 05/18/2010  . ABDOMINAL PAIN, RIGHT LOWER QUADRANT 05/18/2010    Past Surgical History:  Procedure Laterality Date  . BIOPSY  02/02/2019   Procedure: BIOPSY;  Surgeon: Daneil Dolin, MD;  [facility];  Service: Endoscopy;;  gastric  . COLONOSCOPY WITH ESOPHAGOGASTRODUODENOSCOPY (EGD)  12/16/2012   internal hemorrhoids, colonic diverticulosis, benign polyps, screening in 2024. EGD with mild erosive reflux esophagitis, small hiatal hernia, negative H.pylori  . cyst reomved     from spine  . ESOPHAGOGASTRODUODENOSCOPY  05/19/09   RMR: Geographic distal esophageal erosions consistent with severe erosive reflux esophagitis. schatzki's ring s/P dilation/small hiatal  hernia otherwise normal stomach  . ESOPHAGOGASTRODUODENOSCOPY (EGD) WITH PROPOFOL N/A 02/02/2019   Dr. Gala Romney: retained gastric contents. portal HTN gastropathy  . ESOPHAGOGASTRODUODENOSCOPY (EGD) WITH PROPOFOL N/A 11/20/2019   Procedure: ESOPHAGOGASTRODUODENOSCOPY (EGD) WITH PROPOFOL;  Surgeon: Daneil Dolin, MD;  [facility];  Service: Endoscopy;  Laterality: N/A;  . ESOPHAGOGASTRODUODENOSCOPY (EGD) WITH PROPOFOL N/A 11/24/2019   Procedure: ESOPHAGOGASTRODUODENOSCOPY (EGD) WITH PROPOFOL;  Surgeon: Ronnette Juniper, MD;  Location: [place];  Service: Gastroenterology;  Laterality: N/A;  . IR ANGIOGRAM SELECTIVE EACH ADDITIONAL VESSEL  11/22/2019  . IR ANGIOGRAM SELECTIVE EACH ADDITIONAL VESSEL  11/24/2019  . IR ANGIOGRAM SELECTIVE EACH ADDITIONAL VESSEL  11/24/2019  . IR ANGIOGRAM SELECTIVE EACH ADDITIONAL VESSEL  11/24/2019  . IR EMBO ART  VEN HEMORR LYMPH EXTRAV  INC GUIDE ROADMAPPING  11/22/2019  . IR EMBO ART  VEN HEMORR LYMPH EXTRAV  INC GUIDE ROADMAPPING  11/24/2019  . IR RADIOLOGIST EVAL & MGMT  12/29/2019  . IR TIPS  11/24/2019  . IR US GUIDE VASC ACCESS RIGHT  11/22/2019  . IR VENOGRAM RENAL UNI LEFT  11/22/2019  . RADIOLOGY WITH ANESTHESIA N/A 11/24/2019   Procedure: IR WITH ANESTHESIA;  Surgeon: Radiologist, Medication, MD;  Location: [place];  Service: Radiology;  Laterality: N/A;  . TIPS PROCEDURE N/A 11/22/2019   Procedure: BRTO/TRANS-JUGULAR INTRAHEPATIC PORTAL SHUNT (TIPS);  Surgeon: Corrie Mckusick, DO;  Location: [place];  Service: Anesthesiology;  Laterality: N/A;  . WISDOM TOOTH EXTRACTION         Family History  Problem Relation Age of Onset  . Cancer Mother        ? etiology  . Heart disease Mother   . Colon polyps Sister 83       two sisters  . Brain cancer Father   . Liver disease Neg Hx     Social History   Tobacco Use  . Smoking status: Current Every Day Smoker    Packs/day: 1.00    Years: 30.00    Pack years: 30.00    Types: Cigarettes     Start date: 12/24/1979  . Smokeless tobacco: Never Used  Vaping Use  . Vaping Use: Never used  Substance Use Topics  . Alcohol use: Yes    Alcohol/week: 0.0 standard drinks    Comment: pt admits to drinking 2 fifths a day  . Drug use: No    Home Medications Prior to Admission medications   Medication Sig Start Date End Date Taking? Authorizing Provider  allopurinol (ZYLOPRIM) 100 MG tablet TAKE 1 TABLET BY MOUTH EVERY DAY 12/10/19   [name], MD  blood glucose meter kit and supplies Dispense based on patient and insurance preference. Test blood sugar once daily.  (FOR ICD-10 E10.9, E11.9). 04/21/20   Elvia Collum M, DO  DEXILANT 60 MG capsule TAKE 1 CAPSULE BY MOUTH EVERY DAY 04/20/20   Elvia Collum M, DO  ferrous sulfate 325 (65 FE) MG EC tablet Take 1 tablet (325 mg total) by mouth 2 (two) times daily. 11/29/19 06/21/20  Elgergawy, Silver Huguenin, MD  furosemide (LASIX) 40 MG tablet Take 1 tablet (40  mg total) by mouth daily as needed for edema. Take Daily for 5 Days, Then Take Daily Only As Needed for Edema 01/13/20   Ahmed Prima, [place] M, PA-C  gabapentin (NEURONTIN) 600 MG tablet Take 1 tablet (600 mg total) by mouth 3 (three) times daily. 12/22/19   Kathrynn Ducking, MD  lactulose, encephalopathy, (CHRONULAC) 10 GM/15ML SOLN Take 15-30cc 1-2 daily to Titrate for 3-4 soft bowel movements a day 06/21/20   Elvia Collum M, DO  Multiple Vitamin (MULTIVITAMIN WITH MINERALS) TABS tablet Take 1 tablet by mouth daily. 11/30/19   Elgergawy, Silver Huguenin, MD  nadolol (CORGARD) 40 MG tablet Take 1.5 tablets (60 mg total) by mouth daily. 01/20/20   Strader, Fransisco Hertz, PA-C  naltrexone (DEPADE) 50 MG tablet Take 1 tablet p.o. daily and on every 6th day take 2 tablets. 04/04/20   Lovena Le, Malena M, DO  ondansetron (ZOFRAN-ODT) 4 MG disintegrating tablet DISSOLVE 1 TABLET ON THE TONGUE EVERY 8 HOURS AS NEEDED FOR NAUSEA 01/20/20   Annitta Needs, NP  PARoxetine (PAXIL) 20 MG tablet TAKE 1 TABLET(20 MG) BY  MOUTH DAILY 05/27/20   Lovena Le, Malena M, DO  potassium chloride SA (KLOR-CON) 20 MEQ tablet Take 1 tablet (20 mEq total) by mouth daily as needed (Take when taking Lasix). 01/13/20   Strader, Fransisco Hertz, PA-C  sildenafil (VIAGRA) 100 MG tablet TAKE 1/2 TABLET BY MOUTH EVERY DAY 03/29/20   [name], MD  sitaGLIPtin (JANUVIA) 25 MG tablet Take 1 tablet (25 mg total) by mouth daily. 05/04/20   Lovena Le, Malena M, DO  sucralfate (CARAFATE) 1 g tablet TAKE 1 TABLET BY MOUTH BEFORE MEALS 07/18/20   Lovena Le, Malena M, DO  traZODone (DESYREL) 50 MG tablet TAKE 1 TABLET BY MOUTH AT BEDTIME AS NEEDED FOR INSOMNIA 05/20/20   Elvia Collum M, DO    Allergies    Patient has no known allergies.  Review of Systems   Review of Systems  Constitutional: Positive for activity change, appetite change and fatigue. Negative for chills and fever.  HENT: Negative for congestion and rhinorrhea.   Respiratory: Negative for cough, chest tightness and shortness of breath.   Cardiovascular: Positive for leg swelling. Negative for chest pain.  Gastrointestinal: Positive for abdominal pain, blood in stool, nausea, rectal pain and vomiting.  Genitourinary: Negative for dysuria and hematuria.  Musculoskeletal: Negative for arthralgias and myalgias.  Skin: Negative for rash.  Neurological: Positive for dizziness and weakness. Negative for headaches.   all other systems are negative except as noted in the HPI and PMH.    Physical Exam Updated Vital Signs BP 111/62 (BP Location: Left Arm)   Pulse 94   Temp 98.3 F (36.8 C) (Oral)   Resp 18   Ht 6' 1"  (1.854 m)   Wt 120.2 kg   SpO2 92%   BMI 34.96 kg/m   Physical Exam Vitals and nursing note reviewed.  Constitutional:      General: He is not in acute distress.    Appearance: He is well-developed.     Comments: Chronically ill-appearing  HENT:     Head: Normocephalic and atraumatic.     Mouth/Throat:     Pharynx: No oropharyngeal exudate.  Eyes:      Conjunctiva/sclera: Conjunctivae normal.     Pupils: Pupils are equal, round, and reactive to light.  Neck:     Comments: No meningismus. Cardiovascular:     Rate and Rhythm: Normal rate and regular rhythm.     Heart sounds:  Normal heart sounds. No murmur heard.   Pulmonary:     Effort: Pulmonary effort is normal. No respiratory distress.     Breath sounds: Normal breath sounds.  Abdominal:     Palpations: Abdomen is soft.     Tenderness: There is abdominal tenderness. There is no guarding or rebound.     Comments: Mild diffuse tenderness, no guarding or rebound, no appreciable fluid with  Genitourinary:    Comments: Maroon stool on rectal exam Musculoskeletal:        General: No tenderness. Normal range of motion.     Cervical back: Normal range of motion and neck supple.     Right lower leg: Edema present.     Left lower leg: Edema present.     Comments: Telangiectasias to ankle  Skin:    General: Skin is warm.  Neurological:     Mental Status: He is alert and oriented to person, place, and time.     Cranial Nerves: No cranial nerve deficit.     Motor: No abnormal muscle tone.     Coordination: Coordination normal.     Comments: No ataxia on finger to nose bilaterally. No pronator drift. 5/5 strength throughout. CN 2-12 intact.Equal grip strength. Sensation intact.  No asterixis  Psychiatric:        Behavior: Behavior normal.     ED Results / Procedures / Treatments   Labs (all labs ordered are listed, but only abnormal results are displayed) Labs Reviewed  CBC WITH DIFFERENTIAL/PLATELET - Abnormal; Notable for the following components:      Result Value   RBC 3.15 (*)    Hemoglobin 12.6 (*)    HCT 36.7 (*)    MCV 116.5 (*)    MCH 40.0 (*)    Platelets 78 (*)    Monocytes Absolute 1.1 (*)    All other components within normal limits  COMPREHENSIVE METABOLIC PANEL - Abnormal; Notable for the following components:   Glucose, Bld 148 (*)    Calcium 8.2 (*)     Total Protein 6.3 (*)    Albumin 2.3 (*)    AST 66 (*)    Alkaline Phosphatase 184 (*)    Total Bilirubin 5.1 (*)    All other components within normal limits  AMMONIA - Abnormal; Notable for the following components:   Ammonia 108 (*)    All other components within normal limits  PROTIME-INR - Abnormal; Notable for the following components:   Prothrombin Time 17.6 (*)    INR 1.5 (*)    All other components within normal limits  APTT - Abnormal; Notable for the following components:   aPTT 39 (*)    All other components within normal limits  HEMOGLOBIN AND HEMATOCRIT, BLOOD - Abnormal; Notable for the following components:   Hemoglobin 12.1 (*)    HCT 35.7 (*)    All other components within normal limits  AMMONIA - Abnormal; Notable for the following components:   Ammonia 121 (*)    All other components within normal limits  COMPREHENSIVE METABOLIC PANEL - Abnormal; Notable for the following components:   Glucose, Bld 152 (*)    Calcium 7.7 (*)    Total Protein 5.4 (*)    Albumin 1.9 (*)    AST 53 (*)    Alkaline Phosphatase 163 (*)    Total Bilirubin 3.9 (*)    All other components within normal limits  PROTIME-INR - Abnormal; Notable for the following components:   Prothrombin Time 19.3 (*)  INR 1.7 (*)    All other components within normal limits  BLOOD GAS, ARTERIAL - Abnormal; Notable for the following components:   pH, Arterial 7.337 (*)    pCO2 arterial 59.7 (*)    pO2, Arterial 123 (*)    Bicarbonate 28.4 (*)    Acid-Base Excess 5.6 (*)    All other components within normal limits  POC OCCULT BLOOD, ED - Abnormal; Notable for the following components:   Fecal Occult Bld POSITIVE (*)    All other components within normal limits  SARS CORONAVIRUS 2 BY RT PCR (HOSPITAL ORDER, [place] [facility])  LIPASE, BLOOD  LACTIC ACID, PLASMA  HEMOGLOBIN AND HEMATOCRIT, BLOOD  HEMOGLOBIN AND HEMATOCRIT, BLOOD  HEMOGLOBIN AND HEMATOCRIT, BLOOD   HEMOGLOBIN AND HEMATOCRIT, BLOOD  HEMOGLOBIN AND HEMATOCRIT, BLOOD  TYPE AND SCREEN    EKG EKG Interpretation  Date/Time:  Friday August 05 2020 01:09:12 EDT Ventricular Rate:  78 PR Interval:    QRS Duration: 107 QT Interval:  432 QTC Calculation: 493 R Axis:   -16 Text Interpretation: Sinus rhythm Borderline left axis deviation Low voltage, precordial leads Borderline prolonged QT interval No significant change was found Confirmed by Ezequiel Essex [phone]) on 08/05/2020 1:19:07 AM   Radiology DG Chest Port 1 View  Result Date: 08/05/2020 CLINICAL DATA:  54 year old male with GI bleed. EXAM: PORTABLE CHEST 1 VIEW COMPARISON:  Chest radiograph dated 11/28/2019. FINDINGS: No focal consolidation, pleural effusion, pneumothorax. The cardiac silhouette is within limits. No acute osseous pathology. IMPRESSION: No active disease. Electronically Signed   By: Anner Crete M.D.   On: 08/05/2020 00:33   CT Angio Abd/Pel W and/or Wo Contrast  Result Date: 08/05/2020 CLINICAL DATA:  Cirrhosis with GI bleeding. EXAM: CTA ABDOMEN AND PELVIS WITHOUT AND WITH CONTRAST TECHNIQUE: Multidetector CT imaging of the abdomen and pelvis was performed using the standard protocol during bolus administration of intravenous contrast. Multiplanar reconstructed images and MIPs were obtained and reviewed to evaluate the vascular anatomy. CONTRAST:  149m OMNIPAQUE IOHEXOL 350 MG/ML SOLN COMPARISON:  None. FINDINGS: VASCULAR Aorta: Mild atheromatous plaque.  No dissection or aneurysm. Celiac: Partially obscured branches due to embolization coils for cirrhotic related varices. Hypertrophic hepatic artery. No evidence of branch occlusion or aneurysm. SMA: The SMA and its branches are smooth and widely patent. No evidence of aneurysm. Renals: Vessels are smooth and widely patent. Accessory renal artery on the right. IMA: Patent. Inflow: Atheromatous plaque without stenosis, dissection, or aneurysm. Proximal Outflow:  Atherosclerosis without dissection or aneurysm. Veins: The systemic and portal venous system is essentially unopacified on this arterial study. Review of the MIP images confirms the above findings. NON-VASCULAR Lower chest: No acute finding Hepatobiliary: Cirrhotic liver morphology with TIPS. Without venous phase opacification TIPS patency is uncertain. Cholelithiasis without findings of acute cholecystitis. Pancreas: Negative Spleen: Enlarged in the setting of portal hypertension. Adrenals/Urinary Tract: Negative adrenals. Symmetric normal renal enhancement. Negative urinary bladder. Stomach/Bowel: Mild thickening of the ascending colon with adjacent mesenteric haziness, likely related to portal hypertension. No suspected bowel inflammation. No bowel obstruction or appendicitis Lymphatic: Negative for adenopathy or retroperitoneal mass Reproductive: Negative Other: Small volume ascites mainly seen about the upper quadrants. 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 IMPRESSION: 1. No evidence of active arterial bleeding. 2. Cirrhosis with TIPS and variceal coiling. Stent patency cannot be established with arterial timing. 3. Small volume ascites. 4. Cholelithiasis. 5.  Aortic Atherosclerosis (ICD10-I70.0). Electronically Signed  By: Monte Fantasia M.D.   On: 08/05/2020 04:35    Procedures .Critical Care Performed by: Ezequiel Essex, MD Authorized by: Ezequiel Essex, MD   Critical care provider statement:    Critical care time (minutes):  60   Critical care was necessary to treat or prevent imminent or life-threatening deterioration of the following conditions:  Shock (GI bleed)   Critical care was time spent personally by me on the following activities:  Discussions with consultants, evaluation of patient's response to treatment, examination of patient, ordering and performing treatments and interventions, ordering and  review of laboratory studies, ordering and review of radiographic studies, pulse oximetry, re-evaluation of patient's condition, obtaining history from patient or surrogate and review of old charts   (including critical care time)  Medications Ordered in ED Medications  sodium chloride 0.9 % bolus 1,000 mL (has no administration in time range)    And  0.9 %  sodium chloride infusion (has no administration in time range)  octreotide (SANDOSTATIN) 2 mcg/mL load via infusion 50 mcg (has no administration in time range)    And  octreotide (SANDOSTATIN) 500 mcg in sodium chloride 0.9 % 250 mL (2 mcg/mL) infusion (has no administration in time range)  pantoprazole (PROTONIX) 80 mg in sodium chloride 0.9 % 100 mL IVPB (has no administration in time range)  pantoprazole (PROTONIX) 80 mg in sodium chloride 0.9 % 100 mL (0.8 mg/mL) infusion (has no administration in time range)  ondansetron (ZOFRAN) injection 4 mg (has no administration in time range)    ED Course  I have reviewed the triage vital signs and the nursing notes.  Pertinent labs & imaging results that were available during my care of the patient were reviewed by me and considered in my medical decision making (see chart for details).  EGD Dec 20 Diminutive grade 1 varices found in lower third of esophagus. Red blood found in cardia and fundus.  Fresh blood and clots noted.  Despite suctioning and lavage, ongoing active bleeding noted, obscuring mucosal visualization. S/p TIPS, with embolization of gastric variceal supply And BRTO.   MDM Rules/Calculators/A&P                         Known cirrhotic with history of esophageal varices status post TIPS procedure here with hematemesis as well as melanotic stools.  Blood pressure 111/62.  Heart rate 94.  Will obtain labs, start IV octreotide and IV Protonix.  Type and screen.  D/w Dr Jenetta Downer of GI.  He is familiar with patient.  He recommends transfer to [facility] in  anticipation of needing aggressive GI intervention and possible IR.  Agrees with IV octreotide and Protonix.  Also requests Rocephin and 1 dose of Reglan.  Hemoglobin is 12.6. Thrombocytopenia stable. Patient did have 1 large volume brown stool in the ED. No witnessed hematemesis.  Blood pressure remained stable in the 100s to 1 teens. He does have new oxygen requirement with saturations in the 80s at rest. Ammonia elevated at 108.  [facility] gastroenterology paged without response. Patient has seen [facility] in the past. Message sent on the computer to Dr. Cristina Gong.  Patient sleepy but arousable on reassessment. Likely due to hyperammonemia and hepatic encephalopathy. He is protecting his airway. Abdomen is soft and nontender.  Continue IV Protonix, IV octreotide. Dose of vitamin K given. CIWA protocol. Holding blood transfusion at the time given hemoglobin of 12 this will need to watch carefully. CTA shows no active  arterial GI bleeding. Unable to assess TIPS stent patency.  Admission to [facility] discussed with Dr. Claria Dice.  Dr. Annette Stable gastroenterology did call back around 6 AM.  He agrees to consult on patient at [facility].  Agrees with IV octreotide and PPI drip.  States if too somnolent for lactulose can hold at this time.  Does not recommend lactulose enema in setting of melena.   Blood pressure remained stable in the ED.  Repeat hemoglobin is 12.1.  Patient arousable but still somnolent after receiving Ativan.  Ammonia elevated at 108.  Unable to give lactulose orally due to his somnolence. ABG without significant CO2 retention.  Bradley Miles was evaluated in Emergency Department on 08/04/2020 for the symptoms described in the history of present illness. He was evaluated in the context of the global COVID-19 pandemic, which necessitated consideration that the patient might be at risk for infection with the SARS-CoV-2 virus that causes COVID-19. Institutional protocols and  algorithms that pertain to the evaluation of patients at risk for COVID-19 are in a state of rapid change based on information released by regulatory bodies including the CDC and federal and state organizations. These policies and algorithms were followed during the patient's care in the ED.  Final Clinical Impression(s) / ED Diagnoses Final diagnoses:  GI bleed    Rx / DC Orders ED Discharge Orders    None       Garey Alleva, Annie Main, MD 08/05/20 [phone]

## 2020-08-04 NOTE — ED Triage Notes (Signed)
Pt with tarry stools and blood noted in emesis.  Pt has cirrhosis of liver due to alcohol abuse.

## 2020-08-05 ENCOUNTER — Emergency Department (HOSPITAL_COMMUNITY): Payer: Managed Care, Other (non HMO)

## 2020-08-05 ENCOUNTER — Encounter (HOSPITAL_COMMUNITY): Payer: Self-pay | Admitting: Family Medicine

## 2020-08-05 ENCOUNTER — Inpatient Hospital Stay (HOSPITAL_COMMUNITY): Payer: Managed Care, Other (non HMO)

## 2020-08-05 DIAGNOSIS — D62 Acute posthemorrhagic anemia: Secondary | ICD-10-CM

## 2020-08-05 DIAGNOSIS — Z808 Family history of malignant neoplasm of other organs or systems: Secondary | ICD-10-CM | POA: Diagnosis not present

## 2020-08-05 DIAGNOSIS — K3189 Other diseases of stomach and duodenum: Secondary | ICD-10-CM | POA: Diagnosis present

## 2020-08-05 DIAGNOSIS — I8511 Secondary esophageal varices with bleeding: Secondary | ICD-10-CM | POA: Diagnosis present

## 2020-08-05 DIAGNOSIS — K7031 Alcoholic cirrhosis of liver with ascites: Secondary | ICD-10-CM | POA: Diagnosis present

## 2020-08-05 DIAGNOSIS — F17210 Nicotine dependence, cigarettes, uncomplicated: Secondary | ICD-10-CM | POA: Diagnosis present

## 2020-08-05 DIAGNOSIS — I493 Ventricular premature depolarization: Secondary | ICD-10-CM | POA: Diagnosis present

## 2020-08-05 DIAGNOSIS — K766 Portal hypertension: Secondary | ICD-10-CM

## 2020-08-05 DIAGNOSIS — K7200 Acute and subacute hepatic failure without coma: Secondary | ICD-10-CM | POA: Diagnosis not present

## 2020-08-05 DIAGNOSIS — Z8249 Family history of ischemic heart disease and other diseases of the circulatory system: Secondary | ICD-10-CM | POA: Diagnosis not present

## 2020-08-05 DIAGNOSIS — Z8371 Family history of colonic polyps: Secondary | ICD-10-CM | POA: Diagnosis not present

## 2020-08-05 DIAGNOSIS — F419 Anxiety disorder, unspecified: Secondary | ICD-10-CM | POA: Diagnosis present

## 2020-08-05 DIAGNOSIS — K922 Gastrointestinal hemorrhage, unspecified: Secondary | ICD-10-CM | POA: Diagnosis present

## 2020-08-05 DIAGNOSIS — Z95828 Presence of other vascular implants and grafts: Secondary | ICD-10-CM | POA: Diagnosis not present

## 2020-08-05 DIAGNOSIS — I10 Essential (primary) hypertension: Secondary | ICD-10-CM | POA: Diagnosis present

## 2020-08-05 DIAGNOSIS — Z79899 Other long term (current) drug therapy: Secondary | ICD-10-CM | POA: Diagnosis not present

## 2020-08-05 DIAGNOSIS — J449 Chronic obstructive pulmonary disease, unspecified: Secondary | ICD-10-CM

## 2020-08-05 DIAGNOSIS — K921 Melena: Secondary | ICD-10-CM | POA: Diagnosis present

## 2020-08-05 DIAGNOSIS — F1020 Alcohol dependence, uncomplicated: Secondary | ICD-10-CM | POA: Diagnosis present

## 2020-08-05 DIAGNOSIS — Z20822 Contact with and (suspected) exposure to covid-19: Secondary | ICD-10-CM | POA: Diagnosis present

## 2020-08-05 DIAGNOSIS — F1010 Alcohol abuse, uncomplicated: Secondary | ICD-10-CM | POA: Diagnosis not present

## 2020-08-05 DIAGNOSIS — K7040 Alcoholic hepatic failure without coma: Secondary | ICD-10-CM | POA: Diagnosis present

## 2020-08-05 DIAGNOSIS — E118 Type 2 diabetes mellitus with unspecified complications: Secondary | ICD-10-CM | POA: Diagnosis present

## 2020-08-05 DIAGNOSIS — K219 Gastro-esophageal reflux disease without esophagitis: Secondary | ICD-10-CM | POA: Diagnosis present

## 2020-08-05 DIAGNOSIS — K920 Hematemesis: Secondary | ICD-10-CM

## 2020-08-05 DIAGNOSIS — I864 Gastric varices: Secondary | ICD-10-CM | POA: Insufficient documentation

## 2020-08-05 DIAGNOSIS — E782 Mixed hyperlipidemia: Secondary | ICD-10-CM | POA: Diagnosis present

## 2020-08-05 LAB — CBG MONITORING, ED
Glucose-Capillary: 145 mg/dL — ABNORMAL HIGH (ref 70–99)
Glucose-Capillary: 127 mg/dL — ABNORMAL HIGH (ref 70–99)
Glucose-Capillary: 145 mg/dL — ABNORMAL HIGH (ref 70–99)

## 2020-08-05 LAB — CBC WITH DIFFERENTIAL/PLATELET
Abs Immature Granulocytes: 0.03 10*3/uL (ref 0.00–0.07)
Basophils Absolute: 0 10*3/uL (ref 0.0–0.1)
Basophils Relative: 1 %
Eosinophils Absolute: 0.4 10*3/uL (ref 0.0–0.5)
Eosinophils Relative: 5 %
HCT: 36.7 % — ABNORMAL LOW (ref 39.0–52.0)
Hemoglobin: 12.6 g/dL — ABNORMAL LOW (ref 13.0–17.0)
Immature Granulocytes: 0 %
Lymphocytes Relative: 13 %
Lymphs Abs: 0.9 10*3/uL (ref 0.7–4.0)
MCH: 40 pg — ABNORMAL HIGH (ref 26.0–34.0)
MCHC: 34.3 g/dL (ref 30.0–36.0)
MCV: 116.5 fL — ABNORMAL HIGH (ref 80.0–100.0)
Monocytes Absolute: 1.1 10*3/uL — ABNORMAL HIGH (ref 0.1–1.0)
Monocytes Relative: 15 %
Neutro Abs: 4.9 10*3/uL (ref 1.7–7.7)
Neutrophils Relative %: 66 %
Platelets: 78 10*3/uL — ABNORMAL LOW (ref 150–400)
RBC: 3.15 MIL/uL — ABNORMAL LOW (ref 4.22–5.81)
RDW: 14.7 % (ref 11.5–15.5)
WBC: 7.3 10*3/uL (ref 4.0–10.5)
nRBC: 0 % (ref 0.0–0.2)

## 2020-08-05 LAB — COMPREHENSIVE METABOLIC PANEL
ALT: 23 U/L (ref 0–44)
ALT: 20 U/L (ref 0–44)
AST: 66 U/L — ABNORMAL HIGH (ref 15–41)
AST: 53 U/L — ABNORMAL HIGH (ref 15–41)
Albumin: 2.3 g/dL — ABNORMAL LOW (ref 3.5–5.0)
Albumin: 1.9 g/dL — ABNORMAL LOW (ref 3.5–5.0)
Alkaline Phosphatase: 184 U/L — ABNORMAL HIGH (ref 38–126)
Alkaline Phosphatase: 163 U/L — ABNORMAL HIGH (ref 38–126)
Anion gap: 9 (ref 5–15)
Anion gap: 6 (ref 5–15)
BUN: 13 mg/dL (ref 6–20)
BUN: 12 mg/dL (ref 6–20)
CO2: 29 mmol/L (ref 22–32)
CO2: 27 mmol/L (ref 22–32)
Calcium: 8.2 mg/dL — ABNORMAL LOW (ref 8.9–10.3)
Calcium: 7.7 mg/dL — ABNORMAL LOW (ref 8.9–10.3)
Chloride: 101 mmol/L (ref 98–111)
Chloride: 105 mmol/L (ref 98–111)
Creatinine, Ser: 0.64 mg/dL (ref 0.61–1.24)
Creatinine, Ser: 0.64 mg/dL (ref 0.61–1.24)
GFR calc Af Amer: >60 mL/min (ref >60)
GFR calc Af Amer: >60 mL/min (ref >60)
GFR calc non Af Amer: >60 mL/min (ref >60)
GFR calc non Af Amer: >60 mL/min (ref >60)
Glucose, Bld: 148 mg/dL — ABNORMAL HIGH (ref 70–99)
Glucose, Bld: 152 mg/dL — ABNORMAL HIGH (ref 70–99)
Potassium: 4 mmol/L (ref 3.5–5.1)
Potassium: 4.8 mmol/L (ref 3.5–5.1)
Sodium: 139 mmol/L (ref 135–145)
Sodium: 138 mmol/L (ref 135–145)
Total Bilirubin: 5.1 mg/dL — ABNORMAL HIGH (ref 0.3–1.2)
Total Bilirubin: 3.9 mg/dL — ABNORMAL HIGH (ref 0.3–1.2)
Total Protein: 6.3 g/dL — ABNORMAL LOW (ref 6.5–8.1)
Total Protein: 5.4 g/dL — ABNORMAL LOW (ref 6.5–8.1)

## 2020-08-05 LAB — BLOOD GAS, ARTERIAL
Acid-Base Excess: 5.6 mmol/L — ABNORMAL HIGH (ref 0.0–2.0)
Bicarbonate: 28.4 mmol/L — ABNORMAL HIGH (ref 20.0–28.0)
FIO2: 34
O2 Saturation: 98.1 %
Patient temperature: 37
pCO2 arterial: 59.7 mmHg — ABNORMAL HIGH (ref 32.0–48.0)
pH, Arterial: 7.337 — ABNORMAL LOW (ref 7.350–7.450)
pO2, Arterial: 123 mmHg — ABNORMAL HIGH (ref 83.0–108.0)

## 2020-08-05 LAB — AMMONIA
Ammonia: 108 umol/L — ABNORMAL HIGH (ref 9–35)
Ammonia: 121 umol/L — ABNORMAL HIGH (ref 9–35)

## 2020-08-05 LAB — APTT: aPTT: 39 seconds — ABNORMAL HIGH (ref 24–36)

## 2020-08-05 LAB — HEMOGLOBIN AND HEMATOCRIT, BLOOD
HCT: 35.7 % — ABNORMAL LOW (ref 39.0–52.0)
HCT: 35.2 % — ABNORMAL LOW (ref 39.0–52.0)
HCT: 33.1 % — ABNORMAL LOW (ref 39.0–52.0)
HCT: 36.6 % — ABNORMAL LOW (ref 39.0–52.0)
Hemoglobin: 12.1 g/dL — ABNORMAL LOW (ref 13.0–17.0)
Hemoglobin: 11.7 g/dL — ABNORMAL LOW (ref 13.0–17.0)
Hemoglobin: 11 g/dL — ABNORMAL LOW (ref 13.0–17.0)
Hemoglobin: 12.3 g/dL — ABNORMAL LOW (ref 13.0–17.0)

## 2020-08-05 LAB — TYPE AND SCREEN
ABO/RH(D): A NEG
Antibody Screen: NEGATIVE

## 2020-08-05 LAB — GLUCOSE, CAPILLARY
Glucose-Capillary: 139 mg/dL — ABNORMAL HIGH (ref 70–99)
Glucose-Capillary: 147 mg/dL — ABNORMAL HIGH (ref 70–99)

## 2020-08-05 LAB — SARS CORONAVIRUS 2 BY RT PCR (HOSPITAL ORDER, PERFORMED IN ~~LOC~~ HOSPITAL LAB): SARS Coronavirus 2: NEGATIVE

## 2020-08-05 LAB — PROTIME-INR
INR: 1.5 — ABNORMAL HIGH (ref 0.8–1.2)
INR: 1.7 — ABNORMAL HIGH (ref 0.8–1.2)
Prothrombin Time: 17.6 seconds — ABNORMAL HIGH (ref 11.4–15.2)
Prothrombin Time: 19.3 seconds — ABNORMAL HIGH (ref 11.4–15.2)

## 2020-08-05 LAB — LACTIC ACID, PLASMA: Lactic Acid, Venous: 1.8 mmol/L (ref 0.5–1.9)

## 2020-08-05 LAB — LIPASE, BLOOD: Lipase: 36 U/L (ref 11–51)

## 2020-08-05 LAB — OCCULT BLOOD X 1 CARD TO LAB, STOOL: Fecal Occult Bld: POSITIVE — AB

## 2020-08-05 MED ORDER — LACTULOSE ENEMA
300.0000 mL | Freq: Three times a day (TID) | ORAL | Status: DC
  Filled 2020-08-05 (×10): qty 300

## 2020-08-05 MED ORDER — INSULIN ASPART 100 UNIT/ML ~~LOC~~ SOLN
0.0000 [IU] | SUBCUTANEOUS | Status: DC
  Administered 2020-08-05 (×3): 1 [IU] via SUBCUTANEOUS
  Filled 2020-08-05 (×3): qty 1

## 2020-08-05 MED ORDER — OCTREOTIDE ACETATE 500 MCG/ML IJ SOLN
INTRAMUSCULAR | Status: AC
  Filled 2020-08-05: qty 1

## 2020-08-05 MED ORDER — FOLIC ACID 1 MG PO TABS
1.0000 mg | ORAL_TABLET | Freq: Every day | ORAL | Status: DC
  Administered 2020-08-05 – 2020-08-07 (×3): 1 mg via ORAL
  Filled 2020-08-05 (×4): qty 1

## 2020-08-05 MED ORDER — THIAMINE HCL 100 MG/ML IJ SOLN
100.0000 mg | Freq: Every day | INTRAMUSCULAR | Status: DC
  Filled 2020-08-05: qty 2

## 2020-08-05 MED ORDER — ADULT MULTIVITAMIN W/MINERALS CH
1.0000 | ORAL_TABLET | Freq: Every day | ORAL | Status: DC
  Administered 2020-08-05 – 2020-08-07 (×3): 1 via ORAL
  Filled 2020-08-05 (×4): qty 1

## 2020-08-05 MED ORDER — SODIUM CHLORIDE 0.9 % IV SOLN
2.0000 g | INTRAVENOUS | Status: DC
  Administered 2020-08-05 – 2020-08-08 (×4): 2 g via INTRAVENOUS
  Filled 2020-08-05 (×4): qty 20

## 2020-08-05 MED ORDER — LORAZEPAM 1 MG PO TABS
1.0000 mg | ORAL_TABLET | ORAL | Status: AC | PRN
  Administered 2020-08-07: 1 mg via ORAL
  Filled 2020-08-05: qty 1

## 2020-08-05 MED ORDER — LORAZEPAM 2 MG/ML IJ SOLN
0.0000 mg | Freq: Two times a day (BID) | INTRAMUSCULAR | Status: DC
  Administered 2020-08-07 – 2020-08-08 (×2): 1 mg via INTRAVENOUS
  Filled 2020-08-05: qty 1

## 2020-08-05 MED ORDER — LORAZEPAM 2 MG/ML IJ SOLN
0.0000 mg | Freq: Four times a day (QID) | INTRAMUSCULAR | Status: AC
  Administered 2020-08-05: 2 mg via INTRAVENOUS
  Administered 2020-08-06 (×2): 1 mg via INTRAVENOUS
  Filled 2020-08-05 (×4): qty 1

## 2020-08-05 MED ORDER — IOHEXOL 350 MG/ML SOLN
100.0000 mL | Freq: Once | INTRAVENOUS | Status: AC | PRN
  Administered 2020-08-05: 100 mL via INTRAVENOUS

## 2020-08-05 MED ORDER — THIAMINE HCL 100 MG PO TABS
100.0000 mg | ORAL_TABLET | Freq: Every day | ORAL | Status: DC
  Administered 2020-08-05 – 2020-08-07 (×3): 100 mg via ORAL
  Filled 2020-08-05 (×3): qty 1

## 2020-08-05 MED ORDER — INSULIN ASPART 100 UNIT/ML ~~LOC~~ SOLN: 0.0000 [IU] | Freq: Three times a day (TID) | SUBCUTANEOUS | Status: DC

## 2020-08-05 MED ORDER — ONDANSETRON HCL 4 MG/2ML IJ SOLN: 4.0000 mg | Freq: Four times a day (QID) | INTRAMUSCULAR | Status: DC | PRN

## 2020-08-05 MED ORDER — ACETAMINOPHEN 650 MG RE SUPP: 650.0000 mg | Freq: Four times a day (QID) | RECTAL | Status: DC | PRN

## 2020-08-05 MED ORDER — LACTULOSE 10 GM/15ML PO SOLN
30.0000 g | Freq: Every day | ORAL | Status: DC
  Administered 2020-08-05: 30 g via ORAL
  Filled 2020-08-05: qty 60

## 2020-08-05 MED ORDER — RIFAXIMIN 550 MG PO TABS
550.0000 mg | ORAL_TABLET | Freq: Two times a day (BID) | ORAL | Status: DC
  Administered 2020-08-05 – 2020-08-07 (×6): 550 mg via ORAL
  Filled 2020-08-05 (×7): qty 1

## 2020-08-05 MED ORDER — METOCLOPRAMIDE HCL 5 MG/ML IJ SOLN
10.0000 mg | Freq: Once | INTRAMUSCULAR | Status: AC
  Administered 2020-08-05: 10 mg via INTRAVENOUS
  Filled 2020-08-05: qty 2

## 2020-08-05 MED ORDER — ONDANSETRON HCL 4 MG PO TABS: 4.0000 mg | ORAL_TABLET | Freq: Four times a day (QID) | ORAL | Status: DC | PRN

## 2020-08-05 MED ORDER — LACTULOSE 10 GM/15ML PO SOLN: 30.0000 g | Freq: Once | ORAL | Status: DC

## 2020-08-05 MED ORDER — INSULIN ASPART 100 UNIT/ML ~~LOC~~ SOLN: 0.0000 [IU] | Freq: Every day | SUBCUTANEOUS | Status: DC

## 2020-08-05 MED ORDER — VITAMIN K1 10 MG/ML IJ SOLN
5.0000 mg | Freq: Once | INTRAMUSCULAR | Status: AC
  Administered 2020-08-05: 5 mg via SUBCUTANEOUS
  Filled 2020-08-05: qty 1

## 2020-08-05 MED ORDER — SODIUM CHLORIDE 0.9 % IV SOLN
1.0000 g | Freq: Once | INTRAVENOUS | Status: AC
  Administered 2020-08-05: 1 g via INTRAVENOUS
  Filled 2020-08-05: qty 10

## 2020-08-05 MED ORDER — SODIUM CHLORIDE 0.9 % IV SOLN: INTRAVENOUS | Status: DC

## 2020-08-05 MED ORDER — PHYTONADIONE 5 MG PO TABS
10.0000 mg | ORAL_TABLET | Freq: Once | ORAL | Status: AC
  Administered 2020-08-05: 10 mg via ORAL
  Filled 2020-08-05: qty 2

## 2020-08-05 MED ORDER — LACTULOSE 10 GM/15ML PO SOLN
30.0000 g | Freq: Four times a day (QID) | ORAL | Status: DC
  Administered 2020-08-05 – 2020-08-06 (×4): 30 g via ORAL
  Filled 2020-08-05 (×4): qty 60

## 2020-08-05 MED ORDER — LORAZEPAM 2 MG/ML IJ SOLN
1.0000 mg | INTRAMUSCULAR | Status: AC | PRN
  Administered 2020-08-06 – 2020-08-07 (×4): 1 mg via INTRAVENOUS
  Administered 2020-08-07: 2 mg via INTRAVENOUS
  Filled 2020-08-05 (×6): qty 1

## 2020-08-05 MED ORDER — ACETAMINOPHEN 325 MG PO TABS: 650.0000 mg | ORAL_TABLET | Freq: Four times a day (QID) | ORAL | Status: DC | PRN

## 2020-08-05 NOTE — Consult Note (Signed)
Referring Provider: ED Physician Primary Care Physician:  Erven Colla, DO Primary Gastroenterologist:  Dr. Gala Romney  Date of Admission: 08/04/20 Date of Consultation: 08/05/20  Reason for Consultation:  GI bleed, alcoholic cirrhosis, hepatic encephalopathy  HPI:  Bradley Miles is a 54 y.o. male with a past medical history of alcoholic cirrhosis, alcoholism, GERD, hypertension, esophageal varices, gastric varices, GI bleed.  The patient was admitted for a 10 days in December 2020 for GI bleed where he underwent a TIPS procedure as well as BRTO; for that admission he noted 15 episodes of vomiting, off medications due to noncompliance and drinking 1/5 of liquor a day.  He was initially treated at [facility] but developed large-volume bright red hematemesis and blood pressure lability for which he was transferred to [facility], ICU for CTA and IR consult.  He received a total of 11 units of PRBC, 3 units of FFP, and 1 dose of vitamin K.  He does have a history of noncompliance.  He presented to the emergency department yesterday evening for coffee-ground emesis and 2 episodes of bright red blood in his emesis around 1:15 PM yesterday.  Also noted dark red-colored stools for an unknown amount of time.  Admitted intermittent compliance with medications and continues to drink (last drink the day before).  Ongoing weakness, confusion, dyspnea.  Notes withdrawal symptoms if he tries to not drink.  Most recent EGD 11/24/2019 which found grade 1 esophageal varices, red blood in the cardia and gastric fundus and gastric body, portal hypertensive gastropathy.  Recommended TIPS and possible BRTO.  Of note, most recent CBC prior to ED presentation (06/20/2020) found hemoglobin of 18.2.  At the same time his CMP found AST/ALT at 165/46, alkaline phosphatase 287, total bilirubin at 3.7.  On presentation to the emergency department his stool was positive for blood.  Hemoglobin had declined to 12.6.  Platelets  remain low at 78.  CMP with improved transaminitis at 66/23, alkaline phosphatase 184, total bilirubin has increased to 5.1.  Initial ammonia 108.  Initial INR 1.5.  Lactic acid normal.  SARS-CoV-2 negative.  Ammonia has since increased to 121.  Due to somnolent state not tolerating oral medications, recommendation to hold lactulose enemas due to GI bleed.  Hemoglobin somewhat stable now at 12.1.  Some improvement in CMP as well (total bilirubin 3.9).  INR increasing to 1.7.  CT angiogram of the abdomen and pelvis found no evidence of active arterial bleeding, cirrhosis with TIPS and variceal coiling with patency not established with arterial timing, small volume ascites.  GI was paged overnight for acute blood loss anemia, GI bleed, alcohol abuse, cirrhosis, portal hypertension.  The patient was started on octreotide, Protonix drip, serial H&H, single dose of Reglan, Rocephin for SBP prophylaxis.  Recommended transfer to [facility] stepdown for possible aggressive GI intervention and interventional radiology.  [facility] has been consulted and is aware.  He has been given vitamin K.  CIWA protocol initiated.  Today he is quite somnolent.  Falls asleep frequently during questioning having to be repeatedly awoken.  He states he started seeing bleeding yesterday afternoon.  Noted hematemesis and coffee-ground emesis.  He had also been having some "dark blood" in his stools.  Some mild abdominal discomfort that is apparently chronic.  Was having nausea and vomiting prior to his hematemesis (as best I can tell from his short and incomplete answers).  Admits he does not always take his medications.  He is still drinking two fifths of  liquor a day.  Denies hematochezia.  No other GI complaints.  Of note, he was oriented x3.  Past Medical History:  Diagnosis Date  . Anxiety   . Controlled type 2 diabetes mellitus with complication, without long-term current use of insulin ([place]) 06/26/2020  . Enlarged liver   .  GERD (gastroesophageal reflux disease)   . Hypertension   . Palpitations   . PVC's (premature ventricular contractions)   . Shortness of breath   . Tussive syncope 12/22/2019  . Varicose veins     Past Surgical History:  Procedure Laterality Date  . BIOPSY  02/02/2019   Procedure: BIOPSY;  Surgeon: Daneil Dolin, MD;  [facility];  Service: Endoscopy;;  gastric  . COLONOSCOPY WITH ESOPHAGOGASTRODUODENOSCOPY (EGD)  12/16/2012   internal hemorrhoids, colonic diverticulosis, benign polyps, screening in 2024. EGD with mild erosive reflux esophagitis, small hiatal hernia, negative H.pylori  . cyst reomved     from spine  . ESOPHAGOGASTRODUODENOSCOPY  05/19/09   RMR: Geographic distal esophageal erosions consistent with severe erosive reflux esophagitis. schatzki's ring s/P dilation/small hiatal hernia otherwise normal stomach  . ESOPHAGOGASTRODUODENOSCOPY (EGD) WITH PROPOFOL N/A 02/02/2019   Dr. Gala Romney: retained gastric contents. portal HTN gastropathy  . ESOPHAGOGASTRODUODENOSCOPY (EGD) WITH PROPOFOL N/A 11/20/2019   Procedure: ESOPHAGOGASTRODUODENOSCOPY (EGD) WITH PROPOFOL;  Surgeon: Daneil Dolin, MD;  [facility];  Service: Endoscopy;  Laterality: N/A;  . ESOPHAGOGASTRODUODENOSCOPY (EGD) WITH PROPOFOL N/A 11/24/2019   Procedure: ESOPHAGOGASTRODUODENOSCOPY (EGD) WITH PROPOFOL;  Surgeon: Ronnette Juniper, MD;  Location: [place];  Service: Gastroenterology;  Laterality: N/A;  . IR ANGIOGRAM SELECTIVE EACH ADDITIONAL VESSEL  11/22/2019  . IR ANGIOGRAM SELECTIVE EACH ADDITIONAL VESSEL  11/24/2019  . IR ANGIOGRAM SELECTIVE EACH ADDITIONAL VESSEL  11/24/2019  . IR ANGIOGRAM SELECTIVE EACH ADDITIONAL VESSEL  11/24/2019  . IR EMBO ART  VEN HEMORR LYMPH EXTRAV  INC GUIDE ROADMAPPING  11/22/2019  . IR EMBO ART  VEN HEMORR LYMPH EXTRAV  INC GUIDE ROADMAPPING  11/24/2019  . IR RADIOLOGIST EVAL & MGMT  12/29/2019  . IR TIPS  11/24/2019  . IR US GUIDE VASC ACCESS RIGHT   11/22/2019  . IR VENOGRAM RENAL UNI LEFT  11/22/2019  . RADIOLOGY WITH ANESTHESIA N/A 11/24/2019   Procedure: IR WITH ANESTHESIA;  Surgeon: Radiologist, Medication, MD;  Location: [place];  Service: Radiology;  Laterality: N/A;  . TIPS PROCEDURE N/A 11/22/2019   Procedure: BRTO/TRANS-JUGULAR INTRAHEPATIC PORTAL SHUNT (TIPS);  Surgeon: Corrie Mckusick, DO;  Location: [place];  Service: Anesthesiology;  Laterality: N/A;  . WISDOM TOOTH EXTRACTION      Prior to Admission medications   Medication Sig Start Date End Date Taking? Authorizing Provider  allopurinol (ZYLOPRIM) 100 MG tablet TAKE 1 TABLET BY MOUTH EVERY DAY 12/10/19   [name], MD  blood glucose meter kit and supplies Dispense based on patient and insurance preference. Test blood sugar once daily.  (FOR ICD-10 E10.9, E11.9). 04/21/20   Elvia Collum M, DO  DEXILANT 60 MG capsule TAKE 1 CAPSULE BY MOUTH EVERY DAY 04/20/20   Elvia Collum M, DO  ferrous sulfate 325 (65 FE) MG EC tablet Take 1 tablet (325 mg total) by mouth 2 (two) times daily. 11/29/19 06/21/20  Elgergawy, Silver Huguenin, MD  furosemide (LASIX) 40 MG tablet Take 1 tablet (40 mg total) by mouth daily as needed for edema. Take Daily for 5 Days, Then Take Daily Only As Needed for Edema 01/13/20   Erma Heritage, PA-C  gabapentin (  NEURONTIN) 600 MG tablet Take 1 tablet (600 mg total) by mouth 3 (three) times daily. 12/22/19   Kathrynn Ducking, MD  lactulose, encephalopathy, (CHRONULAC) 10 GM/15ML SOLN Take 15-30cc 1-2 daily to Titrate for 3-4 soft bowel movements a day 06/21/20   Elvia Collum M, DO  Multiple Vitamin (MULTIVITAMIN WITH MINERALS) TABS tablet Take 1 tablet by mouth daily. 11/30/19   Elgergawy, Silver Huguenin, MD  nadolol (CORGARD) 40 MG tablet Take 1.5 tablets (60 mg total) by mouth daily. 01/20/20   Strader, Fransisco Hertz, PA-C  naltrexone (DEPADE) 50 MG tablet Take 1 tablet p.o. daily and on every 6th day take 2 tablets. 04/04/20   Lovena Le, Malena M, DO  ondansetron  (ZOFRAN-ODT) 4 MG disintegrating tablet DISSOLVE 1 TABLET ON THE TONGUE EVERY 8 HOURS AS NEEDED FOR NAUSEA 01/20/20   Annitta Needs, NP  PARoxetine (PAXIL) 20 MG tablet TAKE 1 TABLET(20 MG) BY MOUTH DAILY 05/27/20   Lovena Le, Malena M, DO  potassium chloride SA (KLOR-CON) 20 MEQ tablet Take 1 tablet (20 mEq total) by mouth daily as needed (Take when taking Lasix). 01/13/20   Strader, Fransisco Hertz, PA-C  sildenafil (VIAGRA) 100 MG tablet TAKE 1/2 TABLET BY MOUTH EVERY DAY 03/29/20   [name], MD  sitaGLIPtin (JANUVIA) 25 MG tablet Take 1 tablet (25 mg total) by mouth daily. 05/04/20   Lovena Le, Malena M, DO  sucralfate (CARAFATE) 1 g tablet TAKE 1 TABLET BY MOUTH BEFORE MEALS 07/18/20   Lovena Le, Malena M, DO  traZODone (DESYREL) 50 MG tablet TAKE 1 TABLET BY MOUTH AT BEDTIME AS NEEDED FOR INSOMNIA 05/20/20   Elvia Collum M, DO    Current Facility-Administered Medications  Medication Dose Route Frequency Provider Last Rate Last Admin  . 0.9 %  sodium chloride infusion   Intravenous Continuous Quintella Baton, MD 125 mL/hr at 08/05/20 0302 New Bag at 08/05/20 0302  . acetaminophen (TYLENOL) tablet 650 mg  650 mg Oral Q6H PRN Crosley, Debby, MD       Or  . acetaminophen (TYLENOL) suppository 650 mg  650 mg Rectal Q6H PRN Crosley, Debby, MD      . folic acid (FOLVITE) tablet 1 mg  1 mg Oral Daily Crosley, Debby, MD   1 mg at 08/05/20 5784  . insulin aspart (novoLOG) injection 0-9 Units  0-9 Units Subcutaneous Q4H Quintella Baton, MD   1 Units at 08/05/20 0810  . lactulose (CHRONULAC) 10 GM/15ML solution 30 g  30 g Oral Once Crosley, Debby, MD      . lactulose ([place]) 10 GM/15ML solution 30 g  30 g Oral Daily Elnora Morrison, MD   30 g at 08/05/20 6962  . LORazepam (ATIVAN) injection 0-4 mg  0-4 mg Intravenous Q6H Crosley, Debby, MD   2 mg at 08/05/20 0119   Followed by  . [START ON 08/07/2020] LORazepam (ATIVAN) injection 0-4 mg  0-4 mg Intravenous Q12H Crosley, Debby, MD      . LORazepam (ATIVAN) tablet  1-4 mg  1-4 mg Oral Q1H PRN Quintella Baton, MD       Or  . LORazepam (ATIVAN) injection 1-4 mg  1-4 mg Intravenous Q1H PRN Crosley, Debby, MD      . multivitamin with minerals tablet 1 tablet  1 tablet Oral Daily Crosley, Debby, MD   1 tablet at 08/05/20 0813  . octreotide (SANDOSTATIN) 500 mcg in sodium chloride 0.9 % 250 mL (2 mcg/mL) infusion  50 mcg/hr Intravenous Continuous Quintella Baton, MD 25 mL/hr at  08/05/20 0206 50 mcg/hr at 08/05/20 0206  . ondansetron (ZOFRAN) tablet 4 mg  4 mg Oral Q6H PRN Crosley, Debby, MD       Or  . ondansetron (ZOFRAN) injection 4 mg  4 mg Intravenous Q6H PRN Crosley, Debby, MD      . pantoprazole (PROTONIX) 80 mg in sodium chloride 0.9 % 100 mL (0.8 mg/mL) infusion  8 mg/hr Intravenous Continuous Crosley, Debby, MD 10 mL/hr at 08/05/20 0228 8 mg/hr at 08/05/20 0228  . thiamine tablet 100 mg  100 mg Oral Daily Crosley, Debby, MD   100 mg at 08/05/20 1779   Or  . thiamine (B-1) injection 100 mg  100 mg Intravenous Daily Quintella Baton, MD       Current Outpatient Medications  Medication Sig Dispense Refill  . allopurinol (ZYLOPRIM) 100 MG tablet TAKE 1 TABLET BY MOUTH EVERY DAY 30 tablet 5  . blood glucose meter kit and supplies Dispense based on patient and insurance preference. Test blood sugar once daily.  (FOR ICD-10 E10.9, E11.9). 1 each 5  . DEXILANT 60 MG capsule TAKE 1 CAPSULE BY MOUTH EVERY DAY 30 capsule 3  . ferrous sulfate 325 (65 FE) MG EC tablet Take 1 tablet (325 mg total) by mouth 2 (two) times daily. 60 tablet 3  . furosemide (LASIX) 40 MG tablet Take 1 tablet (40 mg total) by mouth daily as needed for edema. Take Daily for 5 Days, Then Take Daily Only As Needed for Edema 30 tablet 11  . gabapentin (NEURONTIN) 600 MG tablet Take 1 tablet (600 mg total) by mouth 3 (three) times daily. 270 tablet 1  . lactulose, encephalopathy, (CHRONULAC) 10 GM/15ML SOLN Take 15-30cc 1-2 daily to Titrate for 3-4 soft bowel movements a day 473 mL 1  . Multiple  Vitamin (MULTIVITAMIN WITH MINERALS) TABS tablet Take 1 tablet by mouth daily.    . nadolol (CORGARD) 40 MG tablet Take 1.5 tablets (60 mg total) by mouth daily. 135 tablet 3  . naltrexone (DEPADE) 50 MG tablet Take 1 tablet p.o. daily and on every 6th day take 2 tablets. 35 tablet 3  . ondansetron (ZOFRAN-ODT) 4 MG disintegrating tablet DISSOLVE 1 TABLET ON THE TONGUE EVERY 8 HOURS AS NEEDED FOR NAUSEA 24 tablet 2  . PARoxetine (PAXIL) 20 MG tablet TAKE 1 TABLET(20 MG) BY MOUTH DAILY 30 tablet 5  . potassium chloride SA (KLOR-CON) 20 MEQ tablet Take 1 tablet (20 mEq total) by mouth daily as needed (Take when taking Lasix). 30 tablet 11  . sildenafil (VIAGRA) 100 MG tablet TAKE 1/2 TABLET BY MOUTH EVERY DAY 3 tablet 3  . sitaGLIPtin (JANUVIA) 25 MG tablet Take 1 tablet (25 mg total) by mouth daily. 90 tablet 0  . sucralfate (CARAFATE) 1 g tablet TAKE 1 TABLET BY MOUTH BEFORE MEALS 90 tablet 1  . traZODone (DESYREL) 50 MG tablet TAKE 1 TABLET BY MOUTH AT BEDTIME AS NEEDED FOR INSOMNIA 30 tablet 2    Allergies as of 08/04/2020  . (No Known Allergies)    Family History  Problem Relation Age of Onset  . Cancer Mother        ? etiology  . Heart disease Mother   . Colon polyps Sister 62       two sisters  . Brain cancer Father   . Liver disease Neg Hx     Social History   Socioeconomic History  . Marital status: Married    Spouse name: Not on file  .  Number of children: 2  . Years of education: 66  . Highest education level: High school graduate  Occupational History  . 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 Smoking status: Current Every Day Smoker    Packs/day: 1.00    Years: 30.00    Pack years: 30.00    Types: Cigarettes    Start date: 12/24/1979  . Smokeless tobacco: Never Used  Vaping Use  . Vaping Use: Never used  Substance and Sexual Activity  . Alcohol use: Yes    Alcohol/week: 0.0 standard drinks    Comment: pt admits to  drinking 2 fifths a day  . Drug use: No  . Sexual activity: Yes    Partners: Female    Birth control/protection: None  Other Topics Concern  . Not on file  Social History Narrative   Lives at home with family.   Right-handed.   No daily use of caffeine.   Social Determinants of Health   Financial Resource Strain:   . Difficulty of Paying Living Expenses: Not on file  Food Insecurity:   . Worried About Charity fundraiser in the Last Year: Not on file  . Ran Out of Food in the Last Year: Not on file  Transportation Needs:   . Lack of Transportation (Medical): Not on file  . Lack of Transportation (Non-Medical): Not on file  Physical Activity:   . Days of Exercise per Week: Not on file  . Minutes of Exercise per Session: Not on file  Stress:   . Feeling of Stress : Not on file  Social Connections:   . Frequency of Communication with Friends and Family: Not on file  . Frequency of Social Gatherings with Friends and Family: Not on file  . Attends Religious Services: Not on file  . Active Member of Clubs or Organizations: Not on file  . Attends Archivist Meetings: Not on file  . Marital Status: Not on file  Intimate Partner Violence:   . Fear of Current or Ex-Partner: Not on file  . Emotionally Abused: Not on file  . Physically Abused: Not on file  . Sexually Abused: Not on file    Review of Systems: Limited due to somnolence General: Negative for anorexia, weight loss, fever, chills. Admits fatigue, weakness. ENT: Negative for hoarseness, difficulty swallowing. CV: Negative for chest pain, angina, palpitations, peripheral edema.  Respiratory: Negative for dyspnea at rest, cough, sputum, wheezing.  GI: See history of present illness. Heme: Negative for bruising or bleeding.  Physical Exam: Vital signs in last 24 hours: Temp:  [98.3 F (36.8 C)] 98.3 F (36.8 C) (08/26 2219) Pulse Rate:  [77-94] 79 (08/27 0530) Resp:  [11-22] 12 (08/27 0530) BP:  (99-124)/(48-79) 99/67 (08/27 0530) SpO2:  [84 %-98 %] 98 % (08/27 0530) Weight:  [120.2 kg] 120.2 kg (08/26 2223)   General:   Alert,  Well-developed, well-nourished, pleasant and cooperative in NAD Head:  Normocephalic and atraumatic. Eyes:  Mild scleral icterus Lungs:  Clear throughout to auscultation. No wheezes, crackles, or rhonchi. No acute distress. Heart:  Regular rate and rhythm; no murmurs, clicks, rubs,  or gallops. Abdomen:  Soft and nondistended. Mild abdominal TTP. No masses, hepatosplenomegaly or hernias noted. Normal bowel sounds, without guarding, and without rebound.   Rectal:  Deferred.   Msk:  Symmetrical without gross deformities. Pulses:  Normal bilateral DP pulses noted. Extremities:  Without clubbing or edema. Neurologic:  Alert and  oriented x4;  grossly normal neurologically. Psych:  Alert and cooperative. Normal mood and affect.  Intake/Output from previous day: 08/26 0701 - 08/27 0700 In: 1600 [I.V.:400; IV Piggyback:1200] Out: -  Intake/Output this shift: No intake/output data recorded.  Lab Results: Recent Labs    08/05/20 0031 08/05/20 0543  WBC 7.3  --   HGB 12.6* 12.1*  HCT 36.7* 35.7*  PLT 78*  --    BMET Recent Labs    08/05/20 0031 08/05/20 0543  NA 139 138  K 4.0 4.8  CL 101 105  CO2 29 27  GLUCOSE 148* 152*  BUN 13 12  CREATININE 0.64 0.64  CALCIUM 8.2* 7.7*   LFT Recent Labs    08/05/20 0031 08/05/20 0543  PROT 6.3* 5.4*  ALBUMIN 2.3* 1.9*  AST 66* 53*  ALT 23 20  ALKPHOS 184* 163*  BILITOT 5.1* 3.9*   PT/INR Recent Labs    08/05/20 0031 08/05/20 0543  LABPROT 17.6* 19.3*  INR 1.5* 1.7*   Hepatitis Panel No results for input(s): HEPBSAG, HCVAB, HEPAIGM, HEPBIGM in the last 72 hours. C-Diff No results for input(s): CDIFFTOX in the last 72 hours.  Studies/Results: DG Chest Port 1 View  Result Date: 08/05/2020 CLINICAL DATA:  54 year old male with GI bleed. EXAM: PORTABLE CHEST 1 VIEW COMPARISON:  Chest  radiograph dated 11/28/2019. FINDINGS: No focal consolidation, pleural effusion, pneumothorax. The cardiac silhouette is within limits. No acute osseous pathology. IMPRESSION: No active disease. Electronically Signed   By: Anner Crete M.D.   On: 08/05/2020 00:33   CT Angio Abd/Pel W and/or Wo Contrast  Result Date: 08/05/2020 CLINICAL DATA:  Cirrhosis with GI bleeding. EXAM: CTA ABDOMEN AND PELVIS WITHOUT AND WITH CONTRAST TECHNIQUE: Multidetector CT imaging of the abdomen and pelvis was performed using the standard protocol during bolus administration of intravenous contrast. Multiplanar reconstructed images and MIPs were obtained and reviewed to evaluate the vascular anatomy. CONTRAST:  15m OMNIPAQUE IOHEXOL 350 MG/ML SOLN COMPARISON:  None. FINDINGS: VASCULAR Aorta: Mild atheromatous plaque.  No dissection or aneurysm. Celiac: Partially obscured branches due to embolization coils for cirrhotic related varices. Hypertrophic hepatic artery. No evidence of branch occlusion or aneurysm. SMA: The SMA and its branches are smooth and widely patent. No evidence of aneurysm. Renals: Vessels are smooth and widely patent. Accessory renal artery on the right. IMA: Patent. Inflow: Atheromatous plaque without stenosis, dissection, or aneurysm. Proximal Outflow: Atherosclerosis without dissection or aneurysm. Veins: The systemic and portal venous system is essentially unopacified on this arterial study. Review of the MIP images confirms the above findings. NON-VASCULAR Lower chest: No acute finding Hepatobiliary: Cirrhotic liver morphology with TIPS. Without venous phase opacification TIPS patency is uncertain. Cholelithiasis without findings of acute cholecystitis. Pancreas: Negative Spleen: Enlarged in the setting of portal hypertension. Adrenals/Urinary Tract: Negative adrenals. Symmetric normal renal enhancement. Negative urinary bladder. Stomach/Bowel: Mild thickening of the ascending colon with adjacent  mesenteric haziness, likely related to portal hypertension. No suspected bowel inflammation. No bowel obstruction or appendicitis Lymphatic: Negative for adenopathy or retroperitoneal mass Reproductive: Negative Other: Small volume ascites mainly seen about the upper quadrants. 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 IMPRESSION: 1. No evidence of active arterial bleeding. 2. Cirrhosis with TIPS and variceal coiling. Stent patency cannot be established with arterial timing. 3. Small volume ascites. 4. Cholelithiasis. 5.  Aortic Atherosclerosis (ICD10-I70.0). Electronically Signed   By: JMonte FantasiaM.D.   On: 08/05/2020 04:35  Impression: Unfortunate situation of a 54 year old male well-known to our practice.  Chronic history is as per above, most pressing of alcoholic cirrhosis, continued alcohol abuse, GI bleed.  Recent admission in December 2020 for acute GI bleed deemed from gastric varix status post TIPS and BRTO.  He presented yesterday for hematemesis, coffee-ground emesis, and maroon/dark stools.  He has been having some nausea and vomiting prior to these noted bleeding.  Alcoholic cirrhosis- continued alcohol consumption and abuse.  Describes "shakes" if he stops drinking.  Currently on CIWA protocol.  CT angio without obvious source of bleeding but again noted cirrhosis.  Mild ascites.  Given his cirrhosis and acute GI bleed he will need to be on SBP prophylaxis.  Calculated MELD: 17 and Child-Pugh: C  Recurrent acute GI bleed- his hemoglobin is stable at this time.  No obvious recurrent bleeding in the emergency department since yesterday evening.  Hemoglobin was a bit elevated prior to your next routine, likely hemoconcentrated.  After boluses and treatment his hemoglobin declined to 12.6, 12.1, and just now 11.7.  Apparent mild drift in his hemoglobin, likely equilibration.  Heme positive stool in ED.  Given  acute GI bleed in setting of cirrhosis he will need to be on SBP prophylaxis.  Given his history, multiple differentials are possible including esophagitis, Mallory-Weiss tear, gastritis, gastric varix, cannot rule out esophageal bleed though less likely.  He would probably be best served by transfer to [facility] for interventional radiology evaluation.  However, given limited beds he will likely be here through Monday.  In the meantime we will provide standard of care treatment including octreotide, PPI drip.  He will likely need endoscopic evaluation, although given his soft blood pressures (mid 36C to 383J systolic)and periodic drops in O2 saturations (SARS-CoV-2 negative) and no active/large-volume bleed currently would likely benefit from treatment prior to endoscopy.  It would also be ideal to get his encephalopathy improved prior to endoscopy.  Plan: 1. Continue Rocephin 2. Continue Octreotide 3. Continue PPI drip 4. Continue serial H&H 5. Transfuse as necessary 6. Monitor for airway protection given periodic desats/Ativan protocol 7. Continue plan for transfer to [facility] for likely interventional radiology 8. Lactulose enemas until able to tolerate oral medications 9. When he is able to tolerate oral medication, start oral lactulose and Xifaxan 10. Notify GI promptly for any acute bleed or large volume bleed 11. Nausea management with the goal of preventing further vomiting due to possibility of Mallory-Weiss tear 12. Continued CIWA protocol 13. Supportive measures   Thank you for allowing [place] to participate in the care of Donnelly Angelica, DNP, AGNP-C Adult & Gerontological Nurse Practitioner [facility]   LOS: 0 days     08/05/2020, 8:46 AM

## 2020-08-05 NOTE — ED Provider Notes (Signed)
Boarding and Observation in ED.  Patient CARE signed out to continue to monitor until transfer/admission. Patient with history of cirrhosis, varices present with GI bleed. Patient received or receiving octreotide, Protonix, monitoring. Hemoglobin checked at 12.  Patient confused on exam however will open eyes and answer with a few words. Plan for lactulose to help improve ammonia level and help improve clinically. Plan for repeat hemoglobin later today.  On exam patient has dry mucous membranes, lying in bed sleepy but will open eyes and a few word sentences. Abdomen soft nontender.  Heart rate normal.  .Critical Care Performed by: Elnora Morrison, MD Authorized by: Elnora Morrison, MD   Critical care provider statement:    Critical care time (minutes):  32   Critical care start time:  08/05/2020 7:00 AM   Critical care end time:  08/05/2020 7:32 AM   Critical care time was exclusive of:  Separately billable procedures and treating other patients and teaching time   Critical care was time spent personally by me on the following activities:  Evaluation of patient's response to treatment, examination of patient, ordering and review of laboratory studies, re-evaluation of patient's condition, ordering and performing treatments and interventions and review of old charts   Golda Acre, MD 08/05/20 [phone]

## 2020-08-05 NOTE — Progress Notes (Signed)
Awaiting lactulose from pharmacy

## 2020-08-05 NOTE — ED Notes (Signed)
CBG 145  

## 2020-08-05 NOTE — ED Notes (Signed)
pts O2 fell to 84% on RA, pt placed on 3L of [place], O2 increased to 90s

## 2020-08-05 NOTE — Progress Notes (Addendum)
Patient seen and evaluated, chart reviewed, please see EMR for updated orders. Please see full H&P dictated by admitting physician Dr. Claria Dice for same date of service.    Brief Summary:- 54 year old male with history of decompensated alcoholic cirrhosis complicated by portal hypertension with esophageal varices, gastric varices, previous GI bleed status post TIPS as well as BRTO admitted on 07/30/2020 with acute GI bleed in the setting of hematemesis and melena.     A/p 1)Acute GI bleed--- hematemesis and melena noted -CTA with no active bleeding however there is evidence consistent with previous intervention and cirrhotic changes - Hgb 12.6 >>12.1 >>11.7>>11.0  -Continue IV octreotide for possible varices -Continue IV Protonix -INR is 1.7, repeat vitamin K - 2)Decompensated liver cirrhosis with hepatic encephalopathy----status post prior TIPs and BRTO  --- Ultrasound TIPs eval shows antegrade flow through the TIPS Circuit ----ammonia trending up 108>>121 -Lactulose as ordered -IV Rocephin for SBP prophylaxis -May add rifaximin when tolerating oral intake -IV Rocephin for SBP prophylaxis in the setting of acute GI and possible variceal bleeding  3)Etoh Abuse--patient continues to drink heavily,  up to two fifths of liquor daily.  --High risk for DTs, --lorazepam per CIWA protocol multivitamin as ordered   4)COPD/tobacco abuse--no acute exacerbation, continue bronchodilators, smoking cessation advised  5)DM2-recent A1c 6.9 reflecting excellent DM control PTA=----Use Novolog/Humalog Sliding scale insulin with Accu-Cheks/Fingersticks as ordered   -discussed with GI service,  -Total care time over 48 minutes  Patient seen and evaluated, chart reviewed, please see EMR for updated orders. Please see full H&P dictated by admitting physician Dr. Claria Dice for same date of service.   Roxan Hockey, MD

## 2020-08-05 NOTE — ED Notes (Signed)
Spoke with answering service, paging on call again.

## 2020-08-05 NOTE — ED Notes (Signed)
Requested pharmacy to send IV Protonix and Sandostatin

## 2020-08-05 NOTE — H&P (Addendum)
PCP:   Erven Colla, DO   Chief Complaint:  GI bleed  HPI: This is a 54 year old known alcoholic gentleman with liver cirrhosis and recent 11/2019 GI bleed requiring TIPS procedure and S/p TIPS with embolization of gastric variceal supply.  Since his TIPS he has done well but he has continued to drink and apparently has been noncompliant with his medications.  He presents today with complaints of hematemesis and melena for the past few days.  He started off with melena but developed hematemesis today.  In the ER melanotic stools noted.  He is somewhat encephalopathic with an ammonia level of 108.  His hemoglobin currently is 12.6 but his platelets are low at 78.  His INR is 1.5.  The patient is a poor historian given his encephalopathy as well as the fact that he recently received IV Ativan.  The patient is arousable but goes right back to sleep.   Review of Systems:  The patient denies anorexia, fever, weight loss,, vision loss, decreased hearing, hoarseness, chest pain, syncope, dyspnea on exertion, peripheral edema, balance deficits, abdominal pain, hematochezia, severe indigestion/heartburn, hematuria, incontinence, genital sores, muscle weakness, suspicious skin lesions, transient blindness, difficulty walking, depression, unusual weight change, abnormal bleeding, enlarged lymph nodes, angioedema, and breast masses. Positives: Hematemesis, melena  Past Medical History: Past Medical History:  Diagnosis Date  . Anxiety   . Controlled type 2 diabetes mellitus with complication, without long-term current use of insulin ([place]) 06/26/2020  . Enlarged liver   . GERD (gastroesophageal reflux disease)   . Hypertension   . Palpitations   . PVC's (premature ventricular contractions)   . Shortness of breath   . Tussive syncope 12/22/2019  . Varicose veins    Past Surgical History:  Procedure Laterality Date  . BIOPSY  02/02/2019   Procedure: BIOPSY;  Surgeon: Daneil Dolin, MD;   [facility];  Service: Endoscopy;;  gastric  . COLONOSCOPY WITH ESOPHAGOGASTRODUODENOSCOPY (EGD)  12/16/2012   internal hemorrhoids, colonic diverticulosis, benign polyps, screening in 2024. EGD with mild erosive reflux esophagitis, small hiatal hernia, negative H.pylori  . cyst reomved     from spine  . ESOPHAGOGASTRODUODENOSCOPY  05/19/09   RMR: Geographic distal esophageal erosions consistent with severe erosive reflux esophagitis. schatzki's ring s/P dilation/small hiatal hernia otherwise normal stomach  . ESOPHAGOGASTRODUODENOSCOPY (EGD) WITH PROPOFOL N/A 02/02/2019   Dr. Gala Romney: retained gastric contents. portal HTN gastropathy  . ESOPHAGOGASTRODUODENOSCOPY (EGD) WITH PROPOFOL N/A 11/20/2019   Procedure: ESOPHAGOGASTRODUODENOSCOPY (EGD) WITH PROPOFOL;  Surgeon: Daneil Dolin, MD;  [facility];  Service: Endoscopy;  Laterality: N/A;  . ESOPHAGOGASTRODUODENOSCOPY (EGD) WITH PROPOFOL N/A 11/24/2019   Procedure: ESOPHAGOGASTRODUODENOSCOPY (EGD) WITH PROPOFOL;  Surgeon: Ronnette Juniper, MD;  Location: [facility];  Service: Gastroenterology;  Laterality: N/A;  . IR ANGIOGRAM SELECTIVE EACH ADDITIONAL VESSEL  11/22/2019  . IR ANGIOGRAM SELECTIVE EACH ADDITIONAL VESSEL  11/24/2019  . IR ANGIOGRAM SELECTIVE EACH ADDITIONAL VESSEL  11/24/2019  . IR ANGIOGRAM SELECTIVE EACH ADDITIONAL VESSEL  11/24/2019  . IR EMBO ART  VEN HEMORR LYMPH EXTRAV  INC GUIDE ROADMAPPING  11/22/2019  . IR EMBO ART  VEN HEMORR LYMPH EXTRAV  INC GUIDE ROADMAPPING  11/24/2019  . IR RADIOLOGIST EVAL & MGMT  12/29/2019  . IR TIPS  11/24/2019  . IR US GUIDE VASC ACCESS RIGHT  11/22/2019  . IR VENOGRAM RENAL UNI LEFT  11/22/2019  . RADIOLOGY WITH ANESTHESIA N/A 11/24/2019   Procedure: IR WITH ANESTHESIA;  Surgeon: Radiologist, Medication, MD;  Location: [facility];  Service: Radiology;  Laterality: N/A;  . TIPS PROCEDURE N/A 11/22/2019   Procedure: BRTO/TRANS-JUGULAR INTRAHEPATIC PORTAL SHUNT (TIPS);  Surgeon:  Corrie Mckusick, DO;  Location: [facility];  Service: Anesthesiology;  Laterality: N/A;  . WISDOM TOOTH EXTRACTION      Medications: Prior to Admission medications   Medication Sig Start Date End Date Taking? Authorizing Provider  allopurinol (ZYLOPRIM) 100 MG tablet TAKE 1 TABLET BY MOUTH EVERY DAY 12/10/19   [name], MD  blood glucose meter kit and supplies Dispense based on patient and insurance preference. Test blood sugar once daily.  (FOR ICD-10 E10.9, E11.9). 04/21/20   Elvia Collum M, DO  DEXILANT 60 MG capsule TAKE 1 CAPSULE BY MOUTH EVERY DAY 04/20/20   Elvia Collum M, DO  ferrous sulfate 325 (65 FE) MG EC tablet Take 1 tablet (325 mg total) by mouth 2 (two) times daily. 11/29/19 06/21/20  Elgergawy, Silver Huguenin, MD  furosemide (LASIX) 40 MG tablet Take 1 tablet (40 mg total) by mouth daily as needed for edema. Take Daily for 5 Days, Then Take Daily Only As Needed for Edema 01/13/20   Ahmed Prima, [place] M, PA-C  gabapentin (NEURONTIN) 600 MG tablet Take 1 tablet (600 mg total) by mouth 3 (three) times daily. 12/22/19   Kathrynn Ducking, MD  lactulose, encephalopathy, (CHRONULAC) 10 GM/15ML SOLN Take 15-30cc 1-2 daily to Titrate for 3-4 soft bowel movements a day 06/21/20   Elvia Collum M, DO  Multiple Vitamin (MULTIVITAMIN WITH MINERALS) TABS tablet Take 1 tablet by mouth daily. 11/30/19   Elgergawy, Silver Huguenin, MD  nadolol (CORGARD) 40 MG tablet Take 1.5 tablets (60 mg total) by mouth daily. 01/20/20   Strader, Fransisco Hertz, PA-C  naltrexone (DEPADE) 50 MG tablet Take 1 tablet p.o. daily and on every 6th day take 2 tablets. 04/04/20   Lovena Le, Malena M, DO  ondansetron (ZOFRAN-ODT) 4 MG disintegrating tablet DISSOLVE 1 TABLET ON THE TONGUE EVERY 8 HOURS AS NEEDED FOR NAUSEA 01/20/20   Annitta Needs, NP  PARoxetine (PAXIL) 20 MG tablet TAKE 1 TABLET(20 MG) BY MOUTH DAILY 05/27/20   Lovena Le, Malena M, DO  potassium chloride SA (KLOR-CON) 20 MEQ tablet Take 1 tablet (20 mEq total) by mouth daily as  needed (Take when taking Lasix). 01/13/20   Strader, Fransisco Hertz, PA-C  sildenafil (VIAGRA) 100 MG tablet TAKE 1/2 TABLET BY MOUTH EVERY DAY 03/29/20   [name], MD  sitaGLIPtin (JANUVIA) 25 MG tablet Take 1 tablet (25 mg total) by mouth daily. 05/04/20   Lovena Le, Malena M, DO  sucralfate (CARAFATE) 1 g tablet TAKE 1 TABLET BY MOUTH BEFORE MEALS 07/18/20   Lovena Le, Malena M, DO  traZODone (DESYREL) 50 MG tablet TAKE 1 TABLET BY MOUTH AT BEDTIME AS NEEDED FOR INSOMNIA 05/20/20   Elvia Collum M, DO    Allergies:  No Known Allergies  Social History:  reports that he has been smoking cigarettes. He started smoking about 40 years ago. He has a 30.00 pack-year smoking history. He has never used smokeless tobacco. He reports current alcohol use. He reports that he does not use drugs.  Family History: Family History  Problem Relation Age of Onset  . Cancer Mother        ? etiology  . Heart disease Mother   . Colon polyps Sister 1       two sisters  . Brain cancer Father   . Liver disease Neg Hx     Physical  Exam: Vitals:   08/04/20 2219 08/04/20 2223 08/05/20 0115 08/05/20 0130  BP: 111/62  (!) 100/55 (!) 109/59  Pulse: 94  78 81  Resp: _0 Temp: 98.3 F (36.8 C)     TempSrc: Oral     SpO2: 92%  (!) 84% 97%  Weight:  120.2 kg    Height:  _1  (1.854 m)      General:  Alert but encephalopathic gentleman, well developed and nourished, no acute distress Eyes: PERRLA, pink conjunctiva,  scleral icterus ENT: Moist oral mucosa, neck supple, no thyromegaly Lungs: clear to ascultation, no wheeze, no crackles, no use of accessory muscles Cardiovascular: regular rate and rhythm, no regurgitation, no gallops, no murmurs. No carotid bruits, no JVD Abdomen: soft, decreased BS, non-tender, distended, no organomegaly, not an acute abdomen GU: not examined Neuro: CN II - XII grossly intact, sensation intact Musculoskeletal: strength 5/5 all extremities, no clubbing, cyanosis or  edema Skin: no rash, no subcutaneous crepitation, no decubitus, telangiectasias over both ankles.  Nontender.  Rate and 1+ bilateral lower extremity pitting edema Psych: ap encephalopathic gentleman   Labs on Admission:  Recent Labs    08/05/20 0031  NA 139  K 4.0  CL 101  CO2 29  GLUCOSE 148*  BUN 13  CREATININE 0.64  CALCIUM 8.2*   Recent Labs    08/05/20 0031  AST 66*  ALT 23  ALKPHOS 184*  BILITOT 5.1*  PROT 6.3*  ALBUMIN 2.3*   Recent Labs    08/05/20 0031  LIPASE 36   Recent Labs    08/05/20 0031  WBC 7.3  NEUTROABS 4.9  HGB 12.6*  HCT 36.7*  MCV 116.5*  PLT 78*   No results for input(s): CKTOTAL, CKMB, CKMBINDEX, TROPONINI in the last 72 hours. Invalid input(s): POCBNP No results for input(s): DDIMER in the last 72 hours. No results for input(s): HGBA1C in the last 72 hours. No results for input(s): CHOL, HDL, LDLCALC, TRIG, CHOLHDL, LDLDIRECT in the last 72 hours. No results for input(s): TSH, T4TOTAL, T3FREE, THYROIDAB in the last 72 hours.  Invalid input(s): FREET3 No results for input(s): VITAMINB12, FOLATE, FERRITIN, TIBC, IRON, RETICCTPCT in the last 72 hours.  Micro Results: No results found for this or any previous visit (from the past 240 hour(s)).   Radiological Exams on Admission: DG Chest Port 1 View  Result Date: 08/05/2020 CLINICAL DATA:  54 year old male with GI bleed. EXAM: PORTABLE CHEST 1 VIEW COMPARISON:  Chest radiograph dated 11/28/2019. FINDINGS: No focal consolidation, pleural effusion, pneumothorax. The cardiac silhouette is within limits. No acute osseous pathology. IMPRESSION: No active disease. Electronically Signed   By: Anner Crete M.D.   On: 08/05/2020 00:33    Assessment/Plan Present on Admission: . GIB (gastrointestinal bleeding) . Acute blood loss anemia -Patient discussed between the ER doctor and Dr. Jenetta Downer GI.  Recommendation: Transfer to [facility], admit to stepdown.  [facility] consulted and aware.   Possible IR consult needed.  -Patient NPO, IV fluid hydration ordered -IV Protonix, octreotide drip ordered and continued -Serial H&H -Patient received a single dose of Reglan in the ER -SCDs for DVT prophylaxis  . Alcohol abuse/Cirrhosis of liver with ascites ([place]) . Portal hypertension (HCC)/mild coagulopathy/thrombocytopenia -CIWA protocol -Vitamin K given, INR in a.m.  Acute hepatic encephalopathy -Lactulose ordered, ammonia level in a.m.  Alcohol abuse -CIWA protocol initiated  Borderline diabetes mellitus -Glucose surveillance ordered  . COPD, mild ([place]) -Stable, home meds resumed  . TOBACCO  ABUSE -Nicotine patch, duo nebs as needed   Micajah Dennin 08/05/2020, 2:21 AM

## 2020-08-06 DIAGNOSIS — K922 Gastrointestinal hemorrhage, unspecified: Secondary | ICD-10-CM

## 2020-08-06 LAB — CBC
HCT: 32.7 % — ABNORMAL LOW (ref 39.0–52.0)
Hemoglobin: 10.7 g/dL — ABNORMAL LOW (ref 13.0–17.0)
MCH: 39.5 pg — ABNORMAL HIGH (ref 26.0–34.0)
MCHC: 32.7 g/dL (ref 30.0–36.0)
MCV: 120.7 fL — ABNORMAL HIGH (ref 80.0–100.0)
Platelets: 55 10*3/uL — ABNORMAL LOW (ref 150–400)
RBC: 2.71 MIL/uL — ABNORMAL LOW (ref 4.22–5.81)
RDW: 14.8 % (ref 11.5–15.5)
WBC: 3.7 10*3/uL — ABNORMAL LOW (ref 4.0–10.5)
nRBC: 0 % (ref 0.0–0.2)

## 2020-08-06 LAB — COMPREHENSIVE METABOLIC PANEL
ALT: 25 U/L (ref 0–44)
AST: 101 U/L — ABNORMAL HIGH (ref 15–41)
Albumin: 1.9 g/dL — ABNORMAL LOW (ref 3.5–5.0)
Alkaline Phosphatase: 137 U/L — ABNORMAL HIGH (ref 38–126)
Anion gap: 5 (ref 5–15)
BUN: 10 mg/dL (ref 6–20)
CO2: 30 mmol/L (ref 22–32)
Calcium: 7.6 mg/dL — ABNORMAL LOW (ref 8.9–10.3)
Chloride: 106 mmol/L (ref 98–111)
Creatinine, Ser: 0.59 mg/dL — ABNORMAL LOW (ref 0.61–1.24)
GFR calc Af Amer: >60 mL/min (ref >60)
GFR calc non Af Amer: >60 mL/min (ref >60)
Glucose, Bld: 124 mg/dL — ABNORMAL HIGH (ref 70–99)
Potassium: 4 mmol/L (ref 3.5–5.1)
Sodium: 141 mmol/L (ref 135–145)
Total Bilirubin: 4.4 mg/dL — ABNORMAL HIGH (ref 0.3–1.2)
Total Protein: 5.1 g/dL — ABNORMAL LOW (ref 6.5–8.1)

## 2020-08-06 LAB — GLUCOSE, CAPILLARY
Glucose-Capillary: 121 mg/dL — ABNORMAL HIGH (ref 70–99)
Glucose-Capillary: 117 mg/dL — ABNORMAL HIGH (ref 70–99)
Glucose-Capillary: 135 mg/dL — ABNORMAL HIGH (ref 70–99)
Glucose-Capillary: 142 mg/dL — ABNORMAL HIGH (ref 70–99)
Glucose-Capillary: 116 mg/dL — ABNORMAL HIGH (ref 70–99)

## 2020-08-06 LAB — HEMOGLOBIN AND HEMATOCRIT, BLOOD
HCT: 32.4 % — ABNORMAL LOW (ref 39.0–52.0)
Hemoglobin: 10.8 g/dL — ABNORMAL LOW (ref 13.0–17.0)

## 2020-08-06 LAB — MAGNESIUM: Magnesium: 1.5 mg/dL — ABNORMAL LOW (ref 1.7–2.4)

## 2020-08-06 MED ORDER — NADOLOL 20 MG PO TABS
20.0000 mg | ORAL_TABLET | Freq: Two times a day (BID) | ORAL | Status: DC
  Administered 2020-08-06 – 2020-08-07 (×2): 20 mg via ORAL
  Filled 2020-08-06 (×8): qty 1

## 2020-08-06 MED ORDER — LACTULOSE 10 GM/15ML PO SOLN
30.0000 g | Freq: Two times a day (BID) | ORAL | Status: DC
  Administered 2020-08-07: 30 g via ORAL
  Filled 2020-08-06 (×2): qty 60

## 2020-08-06 MED ORDER — MAGNESIUM SULFATE 4 GM/100ML IV SOLN
4.0000 g | Freq: Once | INTRAVENOUS | Status: AC
  Administered 2020-08-06: 4 g via INTRAVENOUS
  Filled 2020-08-06: qty 100

## 2020-08-06 NOTE — Progress Notes (Addendum)
Patient without complaints other than being hungry. Discussed with nursing staff, Dr. Joesph Fillers and patient spouse. No evidence of GI bleeding in the past 48 hours.  Normal-appearing BMs per nursing staff. Hemoglobin 10.7 this morning-stable from earlier in the year.  No transfusion thus far.  Vital signs in last 24 hours: Temp:  [98.1 F (36.7 C)-98.3 F (36.8 C)] 98.3 F (36.8 C) (08/28 0441) Pulse Rate:  [68-77] 68 (08/28 0441) Resp:  [11-20] 19 (08/28 0441) BP: (101-117)/(54-71) 113/68 (08/28 0441) SpO2:  [91 %-100 %] 98 % (08/28 0441) Weight:  [124.6 kg] 124.6 kg (08/27 1630) Last BM Date: 08/05/20 General: Found patient asleep.  Easily arouses and is alert and appropriate.   Abdomen:  Soft, nontender and nondistended.  Normal bowel sounds, without guarding, and without rebound.  No mass or organomegaly. Extremities:  Without clubbing or edema.    Intake/Output from previous day: 08/27 0701 - 08/28 0700 In: 3312.6 [I.V.:3212.6; IV Piggyback:100] Out: -  Intake/Output this shift: Total I/O In: -  Out: 500 [Urine:500]  Lab Results: Recent Labs    08/05/20 0031 08/05/20 0543 08/05/20 2054 08/06/20 0048 08/06/20 0640  WBC 7.3  --   --   --  3.7*  HGB 12.6*   < > 12.3* 10.8* 10.7*  HCT 36.7*   < > 36.6* 32.4* 32.7*  PLT 78*  --   --   --  55*   < > = values in this interval not displayed.   BMET Recent Labs    08/05/20 0031 08/05/20 0543 08/06/20 0640  NA 139 138 141  K 4.0 4.8 4.0  CL 101 105 106  CO2 29 27 30   GLUCOSE 148* 152* 124*  BUN 13 12 10   CREATININE 0.64 0.64 0.59*  CALCIUM 8.2* 7.7* 7.6*   LFT Recent Labs    08/06/20 0640  PROT 5.1*  ALBUMIN 1.9*  AST 101*  ALT 25  ALKPHOS 137*  BILITOT 4.4*   PT/INR Recent Labs    08/05/20 0031 08/05/20 0543  LABPROT 17.6* 19.3*  INR 1.5* 1.7*   Hepatitis Panel No results for input(s): HEPBSAG, HCVAB, HEPAIGM, HEPBIGM in the last 72 hours. C-Diff No results for input(s): CDIFFTOX in the last  72 hours.  Studies/Results: DG Chest Port 1 View  Result Date: 08/05/2020 CLINICAL DATA:  54 year old male with GI bleed. EXAM: PORTABLE CHEST 1 VIEW COMPARISON:  Chest radiograph dated 11/28/2019. FINDINGS: No focal consolidation, pleural effusion, pneumothorax. The cardiac silhouette is within limits. No acute osseous pathology. IMPRESSION: No active disease. Electronically Signed   By: Anner Crete M.D.   On: 08/05/2020 00:33   US ABDOMINAL PELVIC ART/VENT FLOW DOPPLER LIMITED  Result Date: 08/05/2020 CLINICAL DATA:  Cirrhosis, GI bleeding, now post tips creation 11/24/2019. EXAM: DUPLEX ULTRASOUND OF LIVER AND TIPS SHUNT TECHNIQUE: Color and duplex Doppler ultrasound was performed to evaluate the hepatic in-flow and out-flow vessels. COMPARISON:  12/29/2019 FINDINGS: Portal Vein Velocities Main:  42 cm/sec Right:  105 cm/sec Left: Color Doppler suggests retrograde hepatofugal flow, however doppler waveforms were not obtained. TIPS Stent Velocities Proximal:  136 cm/sec Mid:  130 Distal:  181 cm/sec IVC: Present and patent with normal respiratory phasicity. Hepatic Vein Velocities Right:  165 cm/sec Ascities: None seen Varices: None demonstrated Other findings: IMPRESSION: Stable antegrade flow through the TIPS circuit. Electronically Signed   By: Lucrezia Europe M.D.   On: 08/05/2020 13:30   CT Angio Abd/Pel W and/or Wo Contrast  Result Date: 08/05/2020 CLINICAL DATA:  Cirrhosis with GI bleeding. EXAM: CTA ABDOMEN AND PELVIS WITHOUT AND WITH CONTRAST TECHNIQUE: Multidetector CT imaging of the abdomen and pelvis was performed using the standard protocol during bolus administration of intravenous contrast. Multiplanar reconstructed images and MIPs were obtained and reviewed to evaluate the vascular anatomy. CONTRAST:  192mL OMNIPAQUE IOHEXOL 350 MG/ML SOLN COMPARISON:  None. FINDINGS: VASCULAR Aorta: Mild atheromatous plaque.  No dissection or aneurysm. Celiac: Partially obscured branches due to  embolization coils for cirrhotic related varices. Hypertrophic hepatic artery. No evidence of branch occlusion or aneurysm. SMA: The SMA and its branches are smooth and widely patent. No evidence of aneurysm. Renals: Vessels are smooth and widely patent. Accessory renal artery on the right. IMA: Patent. Inflow: Atheromatous plaque without stenosis, dissection, or aneurysm. Proximal Outflow: Atherosclerosis without dissection or aneurysm. Veins: The systemic and portal venous system is essentially unopacified on this arterial study. Review of the MIP images confirms the above findings. NON-VASCULAR Lower chest: No acute finding Hepatobiliary: Cirrhotic liver morphology with TIPS. Without venous phase opacification TIPS patency is uncertain. Cholelithiasis without findings of acute cholecystitis. Pancreas: Negative Spleen: Enlarged in the setting of portal hypertension. Adrenals/Urinary Tract: Negative adrenals. Symmetric normal renal enhancement. Negative urinary bladder. Stomach/Bowel: Mild thickening of the ascending colon with adjacent mesenteric haziness, likely related to portal hypertension. No suspected bowel inflammation. No bowel obstruction or appendicitis Lymphatic: Negative for adenopathy or retroperitoneal mass Reproductive: Negative Other: Small volume ascites mainly seen about the upper quadrants. 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 IMPRESSION: 1. No evidence of active arterial bleeding. 2. Cirrhosis with TIPS and variceal coiling. Stent patency cannot be established with arterial timing. 3. Small volume ascites. 4. Cholelithiasis. 5.  Aortic Atherosclerosis (ICD10-I70.0). Electronically Signed   By: Monte Fantasia M.D.   On: 08/05/2020 04:35    Impression: Upper GI bleed in the setting of alcoholic cirrhosis and ongoing alcohol abuse.  He is remained stable over the past 48 hours without further bleeding. CTA  abdomen and Doppler ultrasound reviewed.  TIPS appears patent stigmata of prior coiling noted on CT without active bleeding.  I feel the etiology of bleeding is less likely due to esophageal or gastric varices.  With a history of violent vomiting preceding hospitalization, severe reflux esophagitis, Mallory-Weiss tear and/or alcohol induced gastropathy and even peptic ulcer disease remain in the differential.  He really needs to have an EGD prior to any further discussions about IR (which he may not need).  DTs appear to be prevented thus far with ongoing Ativan administration.  Platelet count borderline for any endoscopic intervention at this time.  Recommendations: As discussed with patient, spouse and Dr. Joesph Fillers,  I recommend the following:  Continue octreotide, antibiotics, PPI, nadolol for now.  Allow clear liquid diet.  Tentatively, move towards diagnostic EGD 9/30.  Follow platelets closely.  May need a platelet transfusion if counts dip further.  Overall prognosis guarded.  We will continue to follow closely with you.

## 2020-08-06 NOTE — Progress Notes (Signed)
Patient Demographics:    Bradley Miles, is a 54 y.o. male, DOB - 11-Aug-1966, YIA:[id number]  Admit date - 08/04/2020   Admitting Physician Jinger Middlesworth Denton Brick, MD  Outpatient Primary MD for the patient is Erven Colla, DO  LOS - 1   Chief Complaint  Patient presents with  . GI Bleeding        Subjective:    Bradley Miles today has no fevers, no emesis,  No chest pain,   Wife at bedside -Having loose brown stools from lactulose,  -Sleepy on and off receiving Ativan as needed  Assessment  & Plan :    Active Problems:   TOBACCO ABUSE   Mixed hyperlipidemia   COPD, mild (HCC)   Acute hepatic encephalopathy   Alcoholic cirrhosis of liver with ascites (HCC)   Portal hypertension (HCC)   Acute blood loss anemia   Alcohol abuse   GIB (gastrointestinal bleeding)   Acute GI bleeding  Brief Summary:- 54 year old male with history of decompensated alcoholic cirrhosis complicated by portal hypertension with esophageal varices, gastric varices, previous GI bleed status post TIPS as well as BRTO admitted on 07/30/2020 with acute GI bleed in the setting of hematemesis and melena.  A/p 1)Acute GI bleed--- hematemesis and melena noted -CTA with no active bleeding however there is evidence consistent with previous intervention and cirrhotic changes - Hgb 12.6 >>12.1 >>11.7>>11.0 >> 10.8 >> 10.7 -Continue IV octreotide for possible varices -Continue IV Protonix -INR is 1.7, received vitamin K -Discussed with GI service possible EGD on Monday, 08/08/2020 -Clear liquid diet for now  2)Decompensated liver cirrhosis with hepatic encephalopathy----status post prior TIPs and BRTO  --- Ultrasound TIPs eval shows antegrade flow through the [place] ----ammonia trending up 108>>121 -Decrease lactulose to twice daily dosing -Continue rifaximin, nadolol -IV Rocephin for SBP prophylaxis in the setting of  acute GI and possible variceal bleeding -Less confused,   3)Etoh Abuse-- PTA patient was drinking heavily,  up to two fifths of liquor daily.  --High risk for DTs, --lorazepam per CIWA protocol multivitamin as ordered  4)COPD/tobacco abuse--no acute exacerbation, continue bronchodilators, smoking cessation advised  5)DM2-recent A1c 6.9 reflecting excellent DM control PTA=----Use Novolog/Humalog Sliding scale insulin with Accu-Cheks/Fingersticks as ordered   6)Hypomagnesemia--replace and recheck     Disposition/Need for in-Hospital Stay- patient unable to be discharged at this time due to --- decompensated liver cirrhosis with concerns about variceal bleeding requiring IV octreotide and IV Protonix and frequent monitoring  Status is: Inpatient  Remains inpatient appropriate because:decompensated liver cirrhosis with concerns about variceal bleeding requiring IV octreotide and IV Protonix and frequent monitoring   Disposition: The patient is from: Home              Anticipated d/c is to: Home              Anticipated d/c date is: 3 days              Patient currently is not medically stable to d/c. Barriers: Not Clinically Stable- -decompensated liver cirrhosis with concerns about variceal bleeding requiring IV octreotide and IV Protonix and frequent monitoring  Code Status : full code  Family Communication:   (patient is alert, awake and coherent) *  -Discussed with wife at bedside  Consults  :  Gi  DVT Prophylaxis  :    - SCDs /GI bleed  Lab Results  Component Value Date   PLT 55 (L) 08/06/2020    Inpatient Medications  Scheduled Meds: . folic acid  1 mg Oral Daily  . insulin aspart  0-5 Units Subcutaneous QHS  . insulin aspart  0-6 Units Subcutaneous TID WC  . lactulose  30 g Oral BID  . LORazepam  0-4 mg Intravenous Q6H   Followed by  . [START ON 08/07/2020] LORazepam  0-4 mg Intravenous Q12H  . multivitamin with minerals  1 tablet Oral Daily  . rifaximin  550  mg Oral BID  . thiamine  100 mg Oral Daily   Or  . thiamine  100 mg Intravenous Daily   Continuous Infusions: . sodium chloride 75 mL/hr at 08/06/20 0854  . cefTRIAXone (ROCEPHIN)  IV 2 g (08/06/20 1235)  . magnesium sulfate bolus IVPB    . octreotide  (SANDOSTATIN)    IV infusion 50 mcg/hr (08/05/20 1121)  . pantoprozole (PROTONIX) infusion 8 mg/hr (08/05/20 1122)   PRN Meds:.acetaminophen **OR** acetaminophen, LORazepam **OR** LORazepam, ondansetron **OR** ondansetron (ZOFRAN) IV    Anti-infectives (From admission, onward)   Start     Dose/Rate Route Frequency Ordered Stop   08/05/20 1330  cefTRIAXone (ROCEPHIN) 2 g in sodium chloride 0.9 % 100 mL IVPB        2 g 200 mL/hr over 30 Minutes Intravenous Every 24 hours 08/05/20 1325     08/05/20 1245  rifaximin (XIFAXAN) tablet 550 mg        550 mg Oral 2 times daily 08/05/20 1238     08/05/20 0015  cefTRIAXone (ROCEPHIN) 1 g in sodium chloride 0.9 % 100 mL IVPB        1 g 200 mL/hr over 30 Minutes Intravenous  Once 08/05/20 0004 08/05/20 0207        Objective:   Vitals:   08/05/20 1630 08/05/20 2030 08/06/20 0441 08/06/20 1215  BP: 117/71 (!) 101/59 113/68   Pulse: 72 74 68 90  Resp: 20 20 19    Temp: 98.2 F (36.8 C) 98.1 F (36.7 C) 98.3 F (36.8 C)   TempSrc: Oral Oral Oral   SpO2: 91% 97% 98%   Weight: 124.6 kg     Height: 6\' 1"  (1.854 m)       Wt Readings from Last 3 Encounters:  08/05/20 124.6 kg  06/21/20 120 kg  04/21/20 119.7 kg     Intake/Output Summary (Last 24 hours) at 08/06/2020 1526 Last data filed at 08/06/2020 1236 Gross per 24 hour  Intake 3312.62 ml  Output 502 ml  Net 2810.62 ml     Physical Exam  Gen:- Awake Alert, sleepy at times HEENT:- [place].AT, No sclera icterus Neck-Supple Neck,No JVD,.  Lungs-  CTAB , fair symmetrical air movement CV- S1, S2 normal, regular  Abd-  +ve B.Sounds, Abd Soft, No significant tenderness,    Extremity/Skin:-Trace edema, pedal pulses present   Psych-affect is appropriate, episodes of disorientation and lethargy at times Neuro-generalized weakness, no new focal deficits, +ve tremors   Data Review:   Micro Results Recent Results (from the past 240 hour(s))  SARS Coronavirus 2 by RT PCR (hospital order, performed in [facility] hospital lab) Nasopharyngeal Nasopharyngeal Swab     Status: None   Collection Time: 08/05/20  1:06 AM   Specimen: Nasopharyngeal Swab  Result Value Ref Range Status   SARS Coronavirus 2 NEGATIVE NEGATIVE  Final    Comment: (NOTE) SARS-CoV-2 target nucleic acids are NOT DETECTED.  The SARS-CoV-2 RNA is generally detectable in upper and lower respiratory specimens during the acute phase of infection. The lowest concentration of SARS-CoV-2 viral copies this assay can detect is 250 copies / mL. A negative result does not preclude SARS-CoV-2 infection and should not be used as the sole basis for treatment or other patient management decisions.  A negative result may occur with improper specimen collection / handling, submission of specimen other than nasopharyngeal swab, presence of viral mutation(s) within the areas targeted by this assay, and inadequate number of viral copies (<250 copies / mL). A negative result must be combined with clinical observations, patient history, and epidemiological information.  Fact Sheet for Patients:   StrictlyIdeas.no  Fact Sheet for Healthcare Providers: BankingDealers.co.za  This test is not yet approved or  cleared by the [place] FDA and has been authorized for detection and/or diagnosis of SARS-CoV-2 by FDA under an Emergency Use Authorization (EUA).  This EUA will remain in effect (meaning this test can be used) for the duration of the COVID-19 declaration under Section 564(b)(1) of the Act, 21 U.S.C. section 360bbb-3(b)(1), unless the authorization is terminated or revoked sooner.  Performed at [facility], [address] [place], [place] [address]     Radiology Reports DG Chest [place] 1 View  Result Date: 08/05/2020 CLINICAL DATA:  54 year old male with GI bleed. EXAM: PORTABLE CHEST 1 VIEW COMPARISON:  Chest radiograph dated 11/28/2019. FINDINGS: No focal consolidation, pleural effusion, pneumothorax. The cardiac silhouette is within limits. No acute osseous pathology. IMPRESSION: No active disease. Electronically Signed   By: Anner Crete M.D.   On: 08/05/2020 00:33   US ABDOMINAL PELVIC ART/VENT FLOW DOPPLER LIMITED  Result Date: 08/05/2020 CLINICAL DATA:  Cirrhosis, GI bleeding, now post tips creation 11/24/2019. EXAM: DUPLEX ULTRASOUND OF LIVER AND TIPS SHUNT TECHNIQUE: Color and duplex Doppler ultrasound was performed to evaluate the hepatic in-flow and out-flow vessels. COMPARISON:  12/29/2019 FINDINGS: Portal Vein Velocities Main:  42 cm/sec Right:  105 cm/sec Left: Color Doppler suggests retrograde hepatofugal flow, however doppler waveforms were not obtained. TIPS Stent Velocities Proximal:  136 cm/sec Mid:  130 Distal:  181 cm/sec IVC: Present and patent with normal respiratory phasicity. Hepatic Vein Velocities Right:  165 cm/sec Ascities: None seen Varices: None demonstrated Other findings: IMPRESSION: Stable antegrade flow through the TIPS circuit. Electronically Signed   By: Lucrezia Europe M.D.   On: 08/05/2020 13:30   CT Angio Abd/Pel W and/or Wo Contrast  Result Date: 08/05/2020 CLINICAL DATA:  Cirrhosis with GI bleeding. EXAM: CTA ABDOMEN AND PELVIS WITHOUT AND WITH CONTRAST TECHNIQUE: Multidetector CT imaging of the abdomen and pelvis was performed using the standard protocol during bolus administration of intravenous contrast. Multiplanar reconstructed images and MIPs were obtained and reviewed to evaluate the vascular anatomy. CONTRAST:  17mL OMNIPAQUE IOHEXOL 350 MG/ML SOLN COMPARISON:  None. FINDINGS: VASCULAR Aorta: Mild atheromatous plaque.  No dissection or aneurysm. Celiac:  Partially obscured branches due to embolization coils for cirrhotic related varices. Hypertrophic hepatic artery. No evidence of branch occlusion or aneurysm. SMA: The SMA and its branches are smooth and widely patent. No evidence of aneurysm. Renals: Vessels are smooth and widely patent. Accessory renal artery on the right. IMA: Patent. Inflow: Atheromatous plaque without stenosis, dissection, or aneurysm. Proximal Outflow: Atherosclerosis without dissection or aneurysm. Veins: The systemic and portal venous system is essentially unopacified on this arterial study. Review of the  MIP images confirms the above findings. NON-VASCULAR Lower chest: No acute finding Hepatobiliary: Cirrhotic liver morphology with TIPS. Without venous phase opacification TIPS patency is uncertain. Cholelithiasis without findings of acute cholecystitis. Pancreas: Negative Spleen: Enlarged in the setting of portal hypertension. Adrenals/Urinary Tract: Negative adrenals. Symmetric normal renal enhancement. Negative urinary bladder. Stomach/Bowel: Mild thickening of the ascending colon with adjacent mesenteric haziness, likely related to portal hypertension. No suspected bowel inflammation. No bowel obstruction or appendicitis Lymphatic: Negative for adenopathy or retroperitoneal mass Reproductive: Negative Other: Small volume ascites mainly seen about the upper quadrants. 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 IMPRESSION: 1. No evidence of active arterial bleeding. 2. Cirrhosis with TIPS and variceal coiling. Stent patency cannot be established with arterial timing. 3. Small volume ascites. 4. Cholelithiasis. 5.  Aortic Atherosclerosis (ICD10-I70.0). Electronically Signed   By: Monte Fantasia M.D.   On: 08/05/2020 04:35     CBC Recent Labs  Lab 08/05/20 0031 08/05/20 0543 08/05/20 0928 08/05/20 1443 08/05/20 2054 08/06/20 0048 08/06/20 0640  WBC  7.3  --   --   --   --   --  3.7*  HGB 12.6*   < > 11.7* 11.0* 12.3* 10.8* 10.7*  HCT 36.7*   < > 35.2* 33.1* 36.6* 32.4* 32.7*  PLT 78*  --   --   --   --   --  55*  MCV 116.5*  --   --   --   --   --  120.7*  MCH 40.0*  --   --   --   --   --  39.5*  MCHC 34.3  --   --   --   --   --  32.7  RDW 14.7  --   --   --   --   --  14.8  LYMPHSABS 0.9  --   --   --   --   --   --   MONOABS 1.1*  --   --   --   --   --   --   EOSABS 0.4  --   --   --   --   --   --   BASOSABS 0.0  --   --   --   --   --   --    < > = values in this interval not displayed.    Chemistries  Recent Labs  Lab 08/05/20 0031 08/05/20 0543 08/06/20 0640  NA 139 138 141  K 4.0 4.8 4.0  CL 101 105 106  CO2 29 27 30   GLUCOSE 148* 152* 124*  BUN 13 12 10   CREATININE 0.64 0.64 0.59*  CALCIUM 8.2* 7.7* 7.6*  MG  --   --  1.5*  AST 66* 53* 101*  ALT 23 20 25   ALKPHOS 184* 163* 137*  BILITOT 5.1* 3.9* 4.4*   ------------------------------------------------------------------------------------------------------------------ No results for input(s): CHOL, HDL, LDLCALC, TRIG, CHOLHDL, LDLDIRECT in the last 72 hours.  Lab Results  Component Value Date   HGBA1C 6.9 (H) 06/20/2020   ------------------------------------------------------------------------------------------------------------------ No results for input(s): TSH, T4TOTAL, T3FREE, THYROIDAB in the last 72 hours.  Invalid input(s): FREET3 ------------------------------------------------------------------------------------------------------------------ No results for input(s): VITAMINB12, FOLATE, FERRITIN, TIBC, IRON, RETICCTPCT in the last 72 hours.  Coagulation profile Recent Labs  Lab 08/05/20 0031 08/05/20 0543  INR 1.5* 1.7*    No results for input(s): DDIMER in the last 72 hours.  Cardiac Enzymes  No results for input(s): CKMB, TROPONINI, MYOGLOBIN in the last 168 hours.  Invalid input(s):  CK ------------------------------------------------------------------------------------------------------------------    Component Value Date/Time   BNP 63.0 06/01/2018 0831     Lenon Kuennen M.D on 08/06/2020 at 3:26 PM  Go to [email or web address] - for contact info  [facility] - Office  [phone]

## 2020-08-07 LAB — COMPREHENSIVE METABOLIC PANEL
ALT: 26 U/L (ref 0–44)
AST: 89 U/L — ABNORMAL HIGH (ref 15–41)
Albumin: 1.9 g/dL — ABNORMAL LOW (ref 3.5–5.0)
Alkaline Phosphatase: 134 U/L — ABNORMAL HIGH (ref 38–126)
Anion gap: 5 (ref 5–15)
BUN: 7 mg/dL (ref 6–20)
CO2: 30 mmol/L (ref 22–32)
Calcium: 7.6 mg/dL — ABNORMAL LOW (ref 8.9–10.3)
Chloride: 105 mmol/L (ref 98–111)
Creatinine, Ser: 0.54 mg/dL — ABNORMAL LOW (ref 0.61–1.24)
GFR calc Af Amer: >60 mL/min (ref >60)
GFR calc non Af Amer: >60 mL/min (ref >60)
Glucose, Bld: 100 mg/dL — ABNORMAL HIGH (ref 70–99)
Potassium: 3.5 mmol/L (ref 3.5–5.1)
Sodium: 140 mmol/L (ref 135–145)
Total Bilirubin: 3.6 mg/dL — ABNORMAL HIGH (ref 0.3–1.2)
Total Protein: 5 g/dL — ABNORMAL LOW (ref 6.5–8.1)

## 2020-08-07 LAB — CBC
HCT: 32.5 % — ABNORMAL LOW (ref 39.0–52.0)
Hemoglobin: 10.8 g/dL — ABNORMAL LOW (ref 13.0–17.0)
MCH: 39.9 pg — ABNORMAL HIGH (ref 26.0–34.0)
MCHC: 33.2 g/dL (ref 30.0–36.0)
MCV: 119.9 fL — ABNORMAL HIGH (ref 80.0–100.0)
Platelets: 57 10*3/uL — ABNORMAL LOW (ref 150–400)
RBC: 2.71 MIL/uL — ABNORMAL LOW (ref 4.22–5.81)
RDW: 14.7 % (ref 11.5–15.5)
WBC: 3.7 10*3/uL — ABNORMAL LOW (ref 4.0–10.5)
nRBC: 0 % (ref 0.0–0.2)

## 2020-08-07 LAB — GLUCOSE, CAPILLARY
Glucose-Capillary: 106 mg/dL — ABNORMAL HIGH (ref 70–99)
Glucose-Capillary: 120 mg/dL — ABNORMAL HIGH (ref 70–99)
Glucose-Capillary: 109 mg/dL — ABNORMAL HIGH (ref 70–99)

## 2020-08-07 MED ORDER — TRAZODONE HCL 50 MG PO TABS
50.0000 mg | ORAL_TABLET | Freq: Once | ORAL | Status: AC
  Administered 2020-08-08: 50 mg via ORAL
  Filled 2020-08-07: qty 1

## 2020-08-07 MED ORDER — INSULIN ASPART 100 UNIT/ML ~~LOC~~ SOLN: 0.0000 [IU] | Freq: Three times a day (TID) | SUBCUTANEOUS | Status: DC

## 2020-08-07 MED ORDER — LACTULOSE 10 GM/15ML PO SOLN
20.0000 g | Freq: Two times a day (BID) | ORAL | Status: DC
  Filled 2020-08-07 (×2): qty 30

## 2020-08-07 NOTE — Progress Notes (Signed)
Patient Demographics:    Bradley Miles, is a 54 y.o. male, DOB - 03-19-1966, [name]:[id number]  Admit date - 08/04/2020   Admitting Physician Idora Brosious Denton Brick, MD  Outpatient Primary MD for the patient is Erven Colla, DO  LOS - 2   Chief Complaint  Patient presents with  . GI Bleeding        Subjective:    Rhyland Hinderliter today has no fevers, no emesis,  No chest pain,   Wife at bedside -Stools have been mostly brown -Mild orthostatic dizziness otherwise no new complaints  Assessment  & Plan :    Active Problems:   TOBACCO ABUSE   Mixed hyperlipidemia   COPD, mild (HCC)   Acute hepatic encephalopathy   Alcoholic cirrhosis of liver with ascites (HCC)   Portal hypertension (HCC)   Acute blood loss anemia   Alcohol abuse   GIB (gastrointestinal bleeding)   Acute GI bleeding  Brief Summary:- 54 year old male with history of decompensated alcoholic cirrhosis complicated by portal hypertension with esophageal varices, gastric varices, previous GI bleed status post TIPS as well as BRTO admitted on 07/30/2020 with acute GI bleed in the setting of hematemesis and melena.  A/p 1)Acute GI bleed--- hematemesis and melena noted -CTA with no active bleeding however there is evidence consistent with previous intervention and cirrhotic changes - Hgb 12.6 >>12.1 >>11.7>>11.0 >> 10.8 >> 10.7>>10.8 -Continue IV octreotide for possible varices -Continue IV Protonix -INR is 1.7, received vitamin K -GI consult appreciated  -- EGD on Monday, 08/08/2020 -Clear liquid diet for now  2)Decompensated liver cirrhosis with hepatic encephalopathy----status post prior TIPs and BRTO  --- Ultrasound TIPs eval shows antegrade flow through the [place] ----ammonia trending up 108>>121 -Decrease lactulose to twice daily dosing -Continue rifaximin, nadolol -IV Rocephin for SBP prophylaxis in the setting of acute GI  and possible variceal bleeding More coherent, more cooperative after multiple BMs with lactulose  3)Etoh Abuse-- PTA patient was drinking heavily,  up to two fifths of liquor daily.  --High risk for DTs, --lorazepam per CIWA protocol multivitamin as ordered -Continue multivitamin  4)COPD/tobacco abuse--no acute exacerbation, continue bronchodilators, smoking cessation advised  5)DM2-recent A1c 6.9 reflecting excellent DM control PTA=----Use Novolog/Humalog Sliding scale insulin with Accu-Cheks/Fingersticks as ordered   6)Hypomagnesemia--replaced and recheck  Disposition/Need for in-Hospital Stay- patient unable to be discharged at this time due to --- decompensated liver cirrhosis with concerns about variceal bleeding requiring IV octreotide and IV Protonix and frequent monitoring  Status is: Inpatient  Remains inpatient appropriate because:decompensated liver cirrhosis with concerns about variceal bleeding requiring IV octreotide and IV Protonix and frequent monitoring   Disposition: The patient is from: Home              Anticipated d/c is to: Home              Anticipated d/c date is: 3 days              Patient currently is not medically stable to d/c. Barriers: Not Clinically Stable- -decompensated liver cirrhosis with concerns about variceal bleeding requiring IV octreotide and IV Protonix and frequent monitoring  Code Status : full code  Family Communication:   (patient is alert, awake and coherent) *  -Discussed with wife  at bedside  Consults  :  Gi  DVT Prophylaxis  :    - SCDs /GI bleed  Lab Results  Component Value Date   PLT 57 (L) 08/07/2020    Inpatient Medications  Scheduled Meds: . folic acid  1 mg Oral Daily  . insulin aspart  0-5 Units Subcutaneous QHS  . insulin aspart  0-6 Units Subcutaneous TID WC  . lactulose  30 g Oral BID  . LORazepam  0-4 mg Intravenous Q12H  . multivitamin with minerals  1 tablet Oral Daily  . nadolol  20 mg Oral BID  .  rifaximin  550 mg Oral BID  . thiamine  100 mg Oral Daily   Or  . thiamine  100 mg Intravenous Daily   Continuous Infusions: . sodium chloride 40 mL/hr at 08/06/20 2226  . cefTRIAXone (ROCEPHIN)  IV 2 g (08/06/20 1235)  . octreotide  (SANDOSTATIN)    IV infusion 50 mcg/hr (08/05/20 1121)  . pantoprozole (PROTONIX) infusion 8 mg/hr (08/05/20 1122)   PRN Meds:.acetaminophen **OR** acetaminophen, LORazepam **OR** LORazepam, ondansetron **OR** ondansetron (ZOFRAN) IV    Anti-infectives (From admission, onward)   Start     Dose/Rate Route Frequency Ordered Stop   08/05/20 1330  cefTRIAXone (ROCEPHIN) 2 g in sodium chloride 0.9 % 100 mL IVPB        2 g 200 mL/hr over 30 Minutes Intravenous Every 24 hours 08/05/20 1325     08/05/20 1245  rifaximin (XIFAXAN) tablet 550 mg        550 mg Oral 2 times daily 08/05/20 1238     08/05/20 0015  cefTRIAXone (ROCEPHIN) 1 g in sodium chloride 0.9 % 100 mL IVPB        1 g 200 mL/hr over 30 Minutes Intravenous  Once 08/05/20 0004 08/05/20 0207        Objective:   Vitals:   08/06/20 1215 08/06/20 1800 08/06/20 2348 08/07/20 0627  BP:   (!) 115/58 107/67  Pulse: 90 76 65 67  Resp:      Temp:   98.3 F (36.8 C) 97.8 F (36.6 C)  TempSrc:   Oral Oral  SpO2:   93% 96%  Weight:      Height:        Wt Readings from Last 3 Encounters:  08/05/20 124.6 kg  06/21/20 120 kg  04/21/20 119.7 kg     Intake/Output Summary (Last 24 hours) at 08/07/2020 1212 Last data filed at 08/06/2020 2226 Gross per 24 hour  Intake 1555.78 ml  Output 1 ml  Net 1554.78 ml     Physical Exam  Gen:- Awake Alert, sleepy at times HEENT:- [place].AT, No sclera icterus Neck-Supple Neck,No JVD,.  Lungs-  CTAB , fair symmetrical air movement CV- S1, S2 normal, regular  Abd-  +ve B.Sounds, Abd Soft, No significant tenderness,    Extremity/Skin:-Trace edema, pedal pulses present  Psych-affect is appropriate, episodes of disorientation and lethargy at  times Neuro-generalized weakness, no new focal deficits, +ve tremors   Data Review:   Micro Results Recent Results (from the past 240 hour(s))  SARS Coronavirus 2 by RT PCR (hospital order, performed in [facility] hospital lab) Nasopharyngeal Nasopharyngeal Swab     Status: None   Collection Time: 08/05/20  1:06 AM   Specimen: Nasopharyngeal Swab  Result Value Ref Range Status   SARS Coronavirus 2 NEGATIVE NEGATIVE Final    Comment: (NOTE) SARS-CoV-2 target nucleic acids are NOT DETECTED.  The SARS-CoV-2 RNA is generally  detectable in upper and lower respiratory specimens during the acute phase of infection. The lowest concentration of SARS-CoV-2 viral copies this assay can detect is 250 copies / mL. A negative result does not preclude SARS-CoV-2 infection and should not be used as the sole basis for treatment or other patient management decisions.  A negative result may occur with improper specimen collection / handling, submission of specimen other than nasopharyngeal swab, presence of viral mutation(s) within the areas targeted by this assay, and inadequate number of viral copies (<250 copies / mL). A negative result must be combined with clinical observations, patient history, and epidemiological information.  Fact Sheet for Patients:   StrictlyIdeas.no  Fact Sheet for Healthcare Providers: BankingDealers.co.za  This test is not yet approved or  cleared by the [place] FDA and has been authorized for detection and/or diagnosis of SARS-CoV-2 by FDA under an Emergency Use Authorization (EUA).  This EUA will remain in effect (meaning this test can be used) for the duration of the COVID-19 declaration under Section 564(b)(1) of the Act, 21 U.S.C. section 360bbb-3(b)(1), unless the authorization is terminated or revoked sooner.  Performed at [facility], [address], [place], [place] [address]     Radiology Reports DG  Chest [place] 1 View  Result Date: 08/05/2020 CLINICAL DATA:  54 year old male with GI bleed. EXAM: PORTABLE CHEST 1 VIEW COMPARISON:  Chest radiograph dated 11/28/2019. FINDINGS: No focal consolidation, pleural effusion, pneumothorax. The cardiac silhouette is within limits. No acute osseous pathology. IMPRESSION: No active disease. Electronically Signed   By: Anner Crete M.D.   On: 08/05/2020 00:33   US ABDOMINAL PELVIC ART/VENT FLOW DOPPLER LIMITED  Result Date: 08/05/2020 CLINICAL DATA:  Cirrhosis, GI bleeding, now post tips creation 11/24/2019. EXAM: DUPLEX ULTRASOUND OF LIVER AND TIPS SHUNT TECHNIQUE: Color and duplex Doppler ultrasound was performed to evaluate the hepatic in-flow and out-flow vessels. COMPARISON:  12/29/2019 FINDINGS: Portal Vein Velocities Main:  42 cm/sec Right:  105 cm/sec Left: Color Doppler suggests retrograde hepatofugal flow, however doppler waveforms were not obtained. TIPS Stent Velocities Proximal:  136 cm/sec Mid:  130 Distal:  181 cm/sec IVC: Present and patent with normal respiratory phasicity. Hepatic Vein Velocities Right:  165 cm/sec Ascities: None seen Varices: None demonstrated Other findings: IMPRESSION: Stable antegrade flow through the TIPS circuit. Electronically Signed   By: Lucrezia Europe M.D.   On: 08/05/2020 13:30   CT Angio Abd/Pel W and/or Wo Contrast  Result Date: 08/05/2020 CLINICAL DATA:  Cirrhosis with GI bleeding. EXAM: CTA ABDOMEN AND PELVIS WITHOUT AND WITH CONTRAST TECHNIQUE: Multidetector CT imaging of the abdomen and pelvis was performed using the standard protocol during bolus administration of intravenous contrast. Multiplanar reconstructed images and MIPs were obtained and reviewed to evaluate the vascular anatomy. CONTRAST:  130mL OMNIPAQUE IOHEXOL 350 MG/ML SOLN COMPARISON:  None. FINDINGS: VASCULAR Aorta: Mild atheromatous plaque.  No dissection or aneurysm. Celiac: Partially obscured branches due to embolization coils for cirrhotic related  varices. Hypertrophic hepatic artery. No evidence of branch occlusion or aneurysm. SMA: The SMA and its branches are smooth and widely patent. No evidence of aneurysm. Renals: Vessels are smooth and widely patent. Accessory renal artery on the right. IMA: Patent. Inflow: Atheromatous plaque without stenosis, dissection, or aneurysm. Proximal Outflow: Atherosclerosis without dissection or aneurysm. Veins: The systemic and portal venous system is essentially unopacified on this arterial study. Review of the MIP images confirms the above findings. NON-VASCULAR Lower chest: No acute finding Hepatobiliary: Cirrhotic liver morphology with TIPS. Without  venous phase opacification TIPS patency is uncertain. Cholelithiasis without findings of acute cholecystitis. Pancreas: Negative Spleen: Enlarged in the setting of portal hypertension. Adrenals/Urinary Tract: Negative adrenals. Symmetric normal renal enhancement. Negative urinary bladder. Stomach/Bowel: Mild thickening of the ascending colon with adjacent mesenteric haziness, likely related to portal hypertension. No suspected bowel inflammation. No bowel obstruction or appendicitis Lymphatic: Negative for adenopathy or retroperitoneal mass Reproductive: Negative Other: Small volume ascites mainly seen about the upper quadrants. 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 IMPRESSION: 1. No evidence of active arterial bleeding. 2. Cirrhosis with TIPS and variceal coiling. Stent patency cannot be established with arterial timing. 3. Small volume ascites. 4. Cholelithiasis. 5.  Aortic Atherosclerosis (ICD10-I70.0). Electronically Signed   By: Monte Fantasia M.D.   On: 08/05/2020 04:35     CBC Recent Labs  Lab 08/05/20 0031 08/05/20 0543 08/05/20 1443 08/05/20 2054 08/06/20 0048 08/06/20 0640 08/07/20 0710  WBC 7.3  --   --   --   --  3.7* 3.7*  HGB 12.6*   < > 11.0* 12.3* 10.8* 10.7*  10.8*  HCT 36.7*   < > 33.1* 36.6* 32.4* 32.7* 32.5*  PLT 78*  --   --   --   --  55* 57*  MCV 116.5*  --   --   --   --  120.7* 119.9*  MCH 40.0*  --   --   --   --  39.5* 39.9*  MCHC 34.3  --   --   --   --  32.7 33.2  RDW 14.7  --   --   --   --  14.8 14.7  LYMPHSABS 0.9  --   --   --   --   --   --   MONOABS 1.1*  --   --   --   --   --   --   EOSABS 0.4  --   --   --   --   --   --   BASOSABS 0.0  --   --   --   --   --   --    < > = values in this interval not displayed.    Chemistries  Recent Labs  Lab 08/05/20 0031 08/05/20 0543 08/06/20 0640 08/07/20 0710  NA 139 138 141 140  K 4.0 4.8 4.0 3.5  CL 101 105 106 105  CO2 29 27 30 30   GLUCOSE 148* 152* 124* 100*  BUN 13 12 10 7   CREATININE 0.64 0.64 0.59* 0.54*  CALCIUM 8.2* 7.7* 7.6* 7.6*  MG  --   --  1.5*  --   AST 66* 53* 101* 89*  ALT 23 20 25 26   ALKPHOS 184* 163* 137* 134*  BILITOT 5.1* 3.9* 4.4* 3.6*   ------------------------------------------------------------------------------------------------------------------ No results for input(s): CHOL, HDL, LDLCALC, TRIG, CHOLHDL, LDLDIRECT in the last 72 hours.  Lab Results  Component Value Date   HGBA1C 6.9 (H) 06/20/2020   ------------------------------------------------------------------------------------------------------------------ No results for input(s): TSH, T4TOTAL, T3FREE, THYROIDAB in the last 72 hours.  Invalid input(s): FREET3 ------------------------------------------------------------------------------------------------------------------ No results for input(s): VITAMINB12, FOLATE, FERRITIN, TIBC, IRON, RETICCTPCT in the last 72 hours.  Coagulation profile Recent Labs  Lab 08/05/20 0031 08/05/20 0543  INR 1.5* 1.7*    No results for input(s): DDIMER in the last 72 hours.  Cardiac Enzymes No results for input(s): CKMB, TROPONINI, MYOGLOBIN in the last 168 hours.  Invalid  input(s):  CK ------------------------------------------------------------------------------------------------------------------    Component Value Date/Time   BNP 63.0 06/01/2018 0831     Kamyra Schroeck M.D on 08/07/2020 at 12:12 PM  Go to [email or web address] - for contact info  [facility] - Office  [phone]

## 2020-08-07 NOTE — Progress Notes (Signed)
Patient complains of being hungry. No melena or hematemesis over the past 24 hours. Hemoglobin stable at 10.8; platelets stable at 57K  Vital signs in last 24 hours: Temp:  [97.8 F (36.6 C)-98.3 F (36.8 C)] 97.8 F (36.6 C) (08/29 0627) Pulse Rate:  [65-90] 67 (08/29 0627) BP: (107-115)/(58-67) 107/67 (08/29 0627) SpO2:  [93 %-96 %] 96 % (08/29 0627) Last BM Date: 08/06/20 General:   Alert, pleasant and cooperative in NAD; accompanied by spouse. Abdomen: Nondistended positive bowel sounds soft and nontender  extremities:  Without clubbing or edema.    Intake/Output from previous day: 08/28 0701 - 08/29 0700 In: 1555.8 [P.O.:360; I.V.:1057.6; IV Piggyback:138.2] Out: 502 [Urine:500; Stool:2] Intake/Output this shift: No intake/output data recorded.  Lab Results: Recent Labs    08/05/20 0031 08/05/20 0543 08/06/20 0048 08/06/20 0640 08/07/20 0710  WBC 7.3  --   --  3.7* 3.7*  HGB 12.6*   < > 10.8* 10.7* 10.8*  HCT 36.7*   < > 32.4* 32.7* 32.5*  PLT 78*  --   --  55* 57*   < > = values in this interval not displayed.   BMET Recent Labs    08/05/20 0543 08/06/20 0640 08/07/20 0710  NA 138 141 140  K 4.8 4.0 3.5  CL 105 106 105  CO2 27 30 30   GLUCOSE 152* 124* 100*  BUN 12 10 7   CREATININE 0.64 0.59* 0.54*  CALCIUM 7.7* 7.6* 7.6*   LFT Recent Labs    08/07/20 0710  PROT 5.0*  ALBUMIN 1.9*  AST 89*  ALT 26  ALKPHOS 134*  BILITOT 3.6*   PT/INR Recent Labs    08/05/20 0031 08/05/20 0543  LABPROT 17.6* 19.3*  INR 1.5* 1.7*   Hepatitis Panel No results for input(s): HEPBSAG, HCVAB, HEPAIGM, HEPBIGM in the last 72 hours. C-Diff No results for input(s): CDIFFTOX in the last 72 hours.  Studies/Results: US ABDOMINAL PELVIC ART/VENT FLOW DOPPLER LIMITED  Result Date: 08/05/2020 CLINICAL DATA:  Cirrhosis, GI bleeding, now post tips creation 11/24/2019. EXAM: DUPLEX ULTRASOUND OF LIVER AND TIPS SHUNT TECHNIQUE: Color and duplex Doppler ultrasound was  performed to evaluate the hepatic in-flow and out-flow vessels. COMPARISON:  12/29/2019 FINDINGS: Portal Vein Velocities Main:  42 cm/sec Right:  105 cm/sec Left: Color Doppler suggests retrograde hepatofugal flow, however doppler waveforms were not obtained. TIPS Stent Velocities Proximal:  136 cm/sec Mid:  130 Distal:  181 cm/sec IVC: Present and patent with normal respiratory phasicity. Hepatic Vein Velocities Right:  165 cm/sec Ascities: None seen Varices: None demonstrated Other findings: IMPRESSION: Stable antegrade flow through the TIPS circuit. Electronically Signed   By: Lucrezia Europe M.D.   On: 08/05/2020 13:30    Impression: 54 year old gentleman presents with hematemesis in the setting of known alcohol abuse and cirrhosis. He is remained stable over the past 24 hours.  Clinically, no further bleeding. Status post TIPS previously with patency established via Doppler this admission.  Status post BRTO previously. I suspect varices have been decompressed with the above-mentioned interventions.  More likely differential includes Mallory-Weiss tear, severe GERD and/or alcoholic gastropathy. Also, patient also appears to have alcoholic hepatitis with a DF calculated at 31 today.  Recommendations:   I have offered the patient a diagnostic EGD tomorrow.  The risks, benefits, limitations, alternatives and imponderables have been reviewed with the patient. Potential for esophageal dilation, biopsy, etc. have also been reviewed.  Questions have been answered. All parties agreeable. No need for platelet transfusion at this time.  For now, continue antibiotics, octreotide and PPI, clear liquids  No steroids for now.  Would reassess DF with updated labs prior to discharge.

## 2020-08-08 ENCOUNTER — Inpatient Hospital Stay (HOSPITAL_COMMUNITY): Payer: Managed Care, Other (non HMO) | Admitting: Anesthesiology

## 2020-08-08 ENCOUNTER — Encounter (HOSPITAL_COMMUNITY): Payer: Self-pay | Admitting: Family Medicine

## 2020-08-08 ENCOUNTER — Encounter: Payer: Self-pay | Admitting: Internal Medicine

## 2020-08-08 ENCOUNTER — Encounter (HOSPITAL_COMMUNITY)
Admission: EM | Disposition: A | Discharge status: Home / Self Care | Payer: Self-pay | Source: Home / Self Care | Attending: Family Medicine

## 2020-08-08 ENCOUNTER — Other Ambulatory Visit: Payer: Self-pay

## 2020-08-08 HISTORY — PX: ESOPHAGOGASTRODUODENOSCOPY (EGD) WITH PROPOFOL: SHX5813

## 2020-08-08 LAB — COMPREHENSIVE METABOLIC PANEL
ALT: 26 U/L (ref 0–44)
AST: 87 U/L — ABNORMAL HIGH (ref 15–41)
Albumin: 2 g/dL — ABNORMAL LOW (ref 3.5–5.0)
Alkaline Phosphatase: 133 U/L — ABNORMAL HIGH (ref 38–126)
Anion gap: 7 (ref 5–15)
BUN: <5 mg/dL — ABNORMAL LOW (ref 6–20)
CO2: 27 mmol/L (ref 22–32)
Calcium: 7.9 mg/dL — ABNORMAL LOW (ref 8.9–10.3)
Chloride: 107 mmol/L (ref 98–111)
Creatinine, Ser: 0.49 mg/dL — ABNORMAL LOW (ref 0.61–1.24)
GFR calc Af Amer: >60 mL/min (ref >60)
GFR calc non Af Amer: >60 mL/min (ref >60)
Glucose, Bld: 94 mg/dL (ref 70–99)
Potassium: 3.3 mmol/L — ABNORMAL LOW (ref 3.5–5.1)
Sodium: 141 mmol/L (ref 135–145)
Total Bilirubin: 2.9 mg/dL — ABNORMAL HIGH (ref 0.3–1.2)
Total Protein: 5.1 g/dL — ABNORMAL LOW (ref 6.5–8.1)

## 2020-08-08 LAB — CBC
HCT: 32.2 % — ABNORMAL LOW (ref 39.0–52.0)
Hemoglobin: 10.6 g/dL — ABNORMAL LOW (ref 13.0–17.0)
MCH: 39.3 pg — ABNORMAL HIGH (ref 26.0–34.0)
MCHC: 32.9 g/dL (ref 30.0–36.0)
MCV: 119.3 fL — ABNORMAL HIGH (ref 80.0–100.0)
Platelets: 53 10*3/uL — ABNORMAL LOW (ref 150–400)
RBC: 2.7 MIL/uL — ABNORMAL LOW (ref 4.22–5.81)
RDW: 14.9 % (ref 11.5–15.5)
WBC: 3 10*3/uL — ABNORMAL LOW (ref 4.0–10.5)
nRBC: 0 % (ref 0.0–0.2)

## 2020-08-08 LAB — GLUCOSE, CAPILLARY
Glucose-Capillary: 98 mg/dL (ref 70–99)
Glucose-Capillary: 95 mg/dL (ref 70–99)
Glucose-Capillary: 108 mg/dL — ABNORMAL HIGH (ref 70–99)

## 2020-08-08 LAB — PROTIME-INR
INR: 1.3 — ABNORMAL HIGH (ref 0.8–1.2)
Prothrombin Time: 15.8 seconds — ABNORMAL HIGH (ref 11.4–15.2)

## 2020-08-08 LAB — AMMONIA: Ammonia: 56 umol/L — ABNORMAL HIGH (ref 9–35)

## 2020-08-08 SURGERY — ESOPHAGOGASTRODUODENOSCOPY (EGD) WITH PROPOFOL
Anesthesia: General

## 2020-08-08 MED ORDER — PROPOFOL 500 MG/50ML IV EMUL
INTRAVENOUS | Status: DC | PRN
  Administered 2020-08-08: 150 ug/kg/min via INTRAVENOUS

## 2020-08-08 MED ORDER — DEXILANT 60 MG PO CPDR
60.0000 mg | DELAYED_RELEASE_CAPSULE | Freq: Every day | ORAL | 3 refills | Status: DC
Start: 2020-08-08 — End: 2021-08-23

## 2020-08-08 MED ORDER — DEXILANT 60 MG PO CPDR: 60.0000 mg | DELAYED_RELEASE_CAPSULE | Freq: Every day | ORAL | 3 refills | Status: DC

## 2020-08-08 MED ORDER — LACTULOSE ENCEPHALOPATHY 10 GM/15ML PO SOLN: ORAL | 3 refills | Status: DC

## 2020-08-08 MED ORDER — SODIUM CHLORIDE 0.9 % IV SOLN: INTRAVENOUS | Status: DC

## 2020-08-08 MED ORDER — FERROUS SULFATE 325 (65 FE) MG PO TBEC: 325.0000 mg | DELAYED_RELEASE_TABLET | Freq: Two times a day (BID) | ORAL | 3 refills | Status: DC

## 2020-08-08 MED ORDER — RIFAXIMIN 550 MG PO TABS
550.0000 mg | ORAL_TABLET | Freq: Two times a day (BID) | ORAL | 3 refills | Status: DC
Start: 2020-08-08 — End: 2020-11-10

## 2020-08-08 MED ORDER — STERILE WATER FOR IRRIGATION IR SOLN
Status: DC | PRN
  Administered 2020-08-08: 1.5 mL

## 2020-08-08 MED ORDER — PAROXETINE HCL 20 MG PO TABS
20.0000 mg | ORAL_TABLET | Freq: Every day | ORAL | 3 refills | Status: DC
Start: 2020-08-08 — End: 2020-11-21

## 2020-08-08 MED ORDER — POTASSIUM CHLORIDE CRYS ER 20 MEQ PO TBCR
20.0000 meq | EXTENDED_RELEASE_TABLET | Freq: Every day | ORAL | 11 refills | Status: DC | PRN
Start: 2020-08-08 — End: 2022-09-20

## 2020-08-08 MED ORDER — POTASSIUM CHLORIDE CRYS ER 20 MEQ PO TBCR
40.0000 meq | EXTENDED_RELEASE_TABLET | ORAL | Status: DC
  Administered 2020-08-08: 40 meq via ORAL
  Filled 2020-08-08: qty 2

## 2020-08-08 MED ORDER — CEFDINIR 300 MG PO CAPS: 300.0000 mg | ORAL_CAPSULE | Freq: Two times a day (BID) | ORAL | 0 refills | Status: DC

## 2020-08-08 MED ORDER — RIFAXIMIN 550 MG PO TABS
550.0000 mg | ORAL_TABLET | Freq: Two times a day (BID) | ORAL | 3 refills | Status: DC
Start: 2020-08-08 — End: 2020-08-08

## 2020-08-08 MED ORDER — CEFDINIR 300 MG PO CAPS: 300.0000 mg | ORAL_CAPSULE | Freq: Two times a day (BID) | ORAL | 0 refills | Status: AC

## 2020-08-08 MED ORDER — LACTATED RINGERS IV SOLN: INTRAVENOUS | Status: DC | PRN

## 2020-08-08 MED ORDER — FUROSEMIDE 40 MG PO TABS
20.0000 mg | ORAL_TABLET | Freq: Every day | ORAL | 11 refills | Status: DC
Start: 2020-08-08 — End: 2021-01-11

## 2020-08-08 MED ORDER — PANTOPRAZOLE SODIUM 40 MG PO TBEC
40.0000 mg | DELAYED_RELEASE_TABLET | Freq: Every day | ORAL | Status: DC
  Administered 2020-08-08: 40 mg via ORAL
  Filled 2020-08-08: qty 1

## 2020-08-08 MED ORDER — PROPOFOL 10 MG/ML IV BOLUS
INTRAVENOUS | Status: DC | PRN
  Administered 2020-08-08: 40 mg via INTRAVENOUS
  Administered 2020-08-08: 30 mg via INTRAVENOUS

## 2020-08-08 MED ORDER — NADOLOL 40 MG PO TABS: 60.0000 mg | ORAL_TABLET | Freq: Every day | ORAL | 3 refills | Status: DC

## 2020-08-08 MED ORDER — KETAMINE HCL 10 MG/ML IJ SOLN
INTRAMUSCULAR | Status: DC | PRN
  Administered 2020-08-08: 20 mg via INTRAVENOUS

## 2020-08-08 MED ORDER — KETAMINE HCL 50 MG/5ML IJ SOSY
PREFILLED_SYRINGE | INTRAMUSCULAR | Status: AC
  Filled 2020-08-08: qty 5

## 2020-08-08 NOTE — TOC Transition Note (Addendum)
Transition of Care [facility]) - CM/SW Discharge Note   Patient Details  Name: Bradley Miles MRN: [id number] Date of Birth: July 17, 1966  [facility] CM/SW Contact:  Iona Beard, [place] Phone Number: 08/08/2020, 2:16 PM   Clinical Narrative:    Pt discharging home. TOC consulted for outpatient substance abuse resources. Copy provided and explained to pt.    Final next level of care: Home/Self Care Barriers to Discharge: Barriers Resolved   Patient Goals and CMS Choice Patient states their goals for this hospitalization and ongoing recovery are:: Return home      Discharge Placement                       Discharge Plan and Services                                     Social Determinants of Health (SDOH) Interventions     Readmission Risk Interventions Readmission Risk Prevention Plan 11/27/2019  Transportation Screening Complete  PCP or Specialist Appt within 5-7 Days Not Complete  Not Complete comments pending medical stability  Home Care Screening Complete  Medication Review (RN CM) Complete  Some recent data might be hidden

## 2020-08-08 NOTE — Progress Notes (Addendum)
NPO except sips with meds. Aware of EGD today. Risks and benefits discussed again with stated understanding.   INR 1.3, improved from yesterday (1.7). Ammonia improved to 56. Patient is conversant, oriented, aware of plans for today. Hgb remaining stable. Platelets stable.   DF is less than 32. Using average PT control of 13, DF is 15.8.    Annitta Needs, PhD, ANP-BC [facility]     Attending note: Seen in short stay.  Agree with above assessment.  Patient is stable for diagnostic EGD.  Discussed with anesthesia.  The risks, benefits, limitations, alternatives and imponderables have been reviewed with the patient. Potential for esophageal dilation, biopsy, etc. have also been reviewed.  Questions have been answered. All parties agreeable.

## 2020-08-08 NOTE — Anesthesia Postprocedure Evaluation (Signed)
Anesthesia Post Note  Patient: Bradley Miles  Procedure(s) Performed: ESOPHAGOGASTRODUODENOSCOPY (EGD) WITH PROPOFOL (N/A )  Patient location during evaluation: PACU Anesthesia Type: General Level of consciousness: awake and alert and oriented Pain management: pain level controlled Vital Signs Assessment: post-procedure vital signs reviewed and stable Respiratory status: spontaneous breathing Cardiovascular status: blood pressure returned to baseline and stable Postop Assessment: no apparent nausea or vomiting Anesthetic complications: no   No complications documented.   Last Vitals:  Vitals:   08/08/20 0645 08/08/20 0934  BP: (!) 152/67 117/76  Pulse: 67 67  Resp: 16 15  Temp: 36.7 C 36.6 C  SpO2: 93% 94%    Last Pain:  Vitals:   08/08/20 1044  TempSrc:   PainSc: 0-No pain                 Johnatan Baskette

## 2020-08-08 NOTE — Op Note (Signed)
[facility] Patient Name: Bradley Miles Procedure Date: 08/08/2020 10:27 AM MRN: [id number] Date of Birth: 10-Jul-1966 Attending MD: Norvel Richards , MD CSN: [id number] Age: 54 Admit Type: Inpatient Procedure:                Upper GI endoscopy Indications:              Hematemesis Providers:                Norvel Richards, MD, Janeece Riggers, RN, Lambert Mody, Randa Spike, Technician Referring MD:              Medicines:                Propofol per Anesthesia Complications:            No immediate complications. Estimated Blood Loss:     Estimated blood loss was minimal. Procedure:                Pre-Anesthesia Assessment:                           - Prior to the procedure, a History and Physical                            was performed, and patient medications and                            allergies were reviewed. The patient's tolerance of                            previous anesthesia was also reviewed. The risks                            and benefits of the procedure and the sedation                            options and risks were discussed with the patient.                            All questions were answered, and informed consent                            was obtained. Prior Anticoagulants: The patient has                            taken no previous anticoagulant or antiplatelet                            agents. ASA Grade Assessment: III - A patient with                            severe systemic disease. After reviewing the risks  and benefits, the patient was deemed in                            satisfactory condition to undergo the procedure.                           After obtaining informed consent, the endoscope was                            passed under direct vision. Throughout the                            procedure, the patient's blood pressure, pulse, and                             oxygen saturations were monitored continuously. The                            [id number]-[id number] ([id number]) scope was introduced through the                            mouth, and advanced to the second part of duodenum.                            The upper GI endoscopy was accomplished without                            difficulty. The patient tolerated the procedure                            well. Scope In: 10:50:04 AM Scope Out: 10:54:31 AM Total Procedure Duration: 0 hours 4 minutes 27 seconds  Findings:      2 short columns of grade 1-2 esophageal varices. No bleeding stigmata.       Overlying esophageal mucosa appeared normal.      Diffuse prominent snake skinning or fish scale appearance the gastric       mucosa consistent with portal gastropathy. No ulcer or infiltrating       process seen. No gastric varices seen. Patent pylorus. Normal-appearing       first and second portion of the duodenum Impression:               -Grade 1-2 esophageal varices?"innocent appearing.                            Portal gastropathy -likely source of hematemesis in                            the setting of nausea and vomiting Moderate Sedation:      Moderate (conscious) sedation was personally administered by an       anesthesia professional. The following parameters were monitored: oxygen       saturation, heart rate, blood pressure, respiratory rate, EKG, adequacy       of pulmonary ventilation, and response to care. Recommendation:           - Patient  has a contact number available for                            emergencies. The signs and symptoms of potential                            delayed complications were discussed with the                            patient. Return to normal activities tomorrow.                            Written discharge instructions were provided to the                            patient.                           - Return patient to hospital ward for possible                             discharge same day.                           - Diabetic (ADA) diet.                           -Continue nadolol, lactulose and Xifaxan. Once                            daily PPI. Stop octreotide. Complete a total 5-day                            course of antibiotics. From a GI standpoint, could                            be discharged later today. Alcohol abstinence                            recommended. At patient request, I called Bradley Miles at [phone] -reviewed my findings and                            recommendations. Office visit with [place] in 6 weeks. Procedure Code(s):        --- Professional ---                           [phone], Esophagogastroduodenoscopy, flexible,                            transoral; diagnostic, including collection of  specimen(s) by brushing or washing, when performed                            (separate procedure) Diagnosis Code(s):        --- Professional ---                           K92.0, Hematemesis CPT copyright 2019 [employer] Medical [employer]. All rights reserved. The codes documented in this report are preliminary and upon coder review may  be revised to meet current compliance requirements. Cristopher Estimable. Camelia Stelzner, MD Norvel Richards, MD 08/08/2020 11:06:50 AM This report has been signed electronically. Number of Addenda: 0

## 2020-08-08 NOTE — Discharge Instructions (Signed)
1)Avoid ibuprofen/Advil/Aleve/Motrin/Goody Powders/Naproxen/BC powders/Meloxicam/Diclofenac/Indomethacin and other Nonsteroidal anti-inflammatory medications as these will make you more likely to bleed and can cause stomach ulcers, can also cause Kidney problems.   2) complete abstinence from alcohol or complete abstinence from tobacco strongly advised----please consider outpatient or inpatient alcohol rehab program  3) follow-up with gastroenterologist Dr. Gala Romney in about  6 weeks  4)Repeat CMP and CBC blood test the primary care physician within a week  5) there has been some changes to your medications please take medications as prescribed

## 2020-08-08 NOTE — Transfer of Care (Signed)
Immediate Anesthesia Transfer of Care Note  Patient: Bradley Miles  Procedure(s) Performed: ESOPHAGOGASTRODUODENOSCOPY (EGD) WITH PROPOFOL (N/A )  Patient Location: PACU  Anesthesia Type:General  Level of Consciousness: awake  Airway & Oxygen Therapy: Patient Spontanous Breathing  Post-op Assessment: Report given to RN  Post vital signs: Reviewed and stable  Last Vitals:  Vitals Value Taken Time  BP 97/49 08/08/20 1103  Temp    Pulse 77 08/08/20 1104  Resp 19 08/08/20 1105  SpO2 90 % 08/08/20 1104  Vitals shown include unvalidated device data.  Last Pain:  Vitals:   08/08/20 1044  TempSrc:   PainSc: 0-No pain         Complications: No complications documented.

## 2020-08-08 NOTE — Progress Notes (Signed)
IV removed and discharge instructions reviewed with patient. Scripts sent to pharmacy, work note given with outpatient substance abuse information. Transported by wheelchair to main entrance, wife to drive home.

## 2020-08-08 NOTE — Anesthesia Preprocedure Evaluation (Signed)
Anesthesia Evaluation  Patient identified by MRN, date of birth, ID band Patient awake    Reviewed: Allergy & Precautions, NPO status , Patient's Chart, lab work & pertinent test results  Airway Mallampati: II  TM Distance: >3 FB Neck ROM: Full    Dental  (+) Missing, Dental Advisory Given   Pulmonary shortness of breath, COPD, Current Smoker and Patient abstained from smoking.,    Pulmonary exam normal breath sounds clear to auscultation       Cardiovascular hypertension, Pt. on medications and Pt. on home beta blockers + DVT  Normal cardiovascular exam+ dysrhythmias (PVCs)  Rhythm:Regular Rate:Normal  05-Aug-2020 01:09:12 [facility] ROUTINE RECORD Sinus rhythm Borderline left axis deviation Low voltage, precordial leads Borderline prolonged QT interval No significant change was found Confirmed by Ezequiel Essex [phone]) on 08/05/2020 1:19:07 AM   Neuro/Psych  Headaches, PSYCHIATRIC DISORDERS Anxiety  Neuromuscular disease    GI/Hepatic GERD  Medicated,(+) Cirrhosis   Esophageal Varices  substance abuse  alcohol use, Hepatitis -Upper GI bleeding   Endo/Other  diabetes, Well Controlled, Type 2, Oral Hypoglycemic Agents  Renal/GU      Musculoskeletal   Abdominal   Peds  Hematology  (+) anemia ,   Anesthesia Other Findings   Reproductive/Obstetrics                            Anesthesia Physical Anesthesia Plan  ASA: IV  Anesthesia Plan: General   Post-op Pain Management:    Induction: Intravenous  PONV Risk Score and Plan: TIVA  Airway Management Planned: Nasal Cannula and Natural Airway  Additional Equipment:   Intra-op Plan:   Post-operative Plan: Possible Post-op intubation/ventilation and Extubation in OR  Informed Consent: I have reviewed the patients History and Physical, chart, labs and discussed the procedure including the risks, benefits and  alternatives for the proposed anesthesia with the patient or authorized representative who has indicated his/her understanding and acceptance.     Dental advisory given  Plan Discussed with: CRNA and Surgeon  Anesthesia Plan Comments:        Anesthesia Quick Evaluation

## 2020-08-08 NOTE — Progress Notes (Signed)
Patient returned from EGD procedure, no s/s of distress noted. Tolerated eating lunch well with no c/o sore throat or irritation. Tolerated taking medications whole.

## 2020-08-08 NOTE — Discharge Summary (Signed)
Bradley Miles, is a 54 y.o. male  DOB 1965/12/16  MRN [id number].  Admission date:  08/04/2020  Admitting Physician  Roxan Hockey, MD  Discharge Date:  08/08/2020   Primary MD  Erven Colla, DO  Recommendations for primary care physician for things to follow:   1)Avoid ibuprofen/Advil/Aleve/Motrin/Goody Powders/Naproxen/BC powders/Meloxicam/Diclofenac/Indomethacin and other Nonsteroidal anti-inflammatory medications as these will make you more likely to bleed and can cause stomach ulcers, can also cause Kidney problems.   2) complete abstinence from alcohol or complete abstinence from tobacco strongly advised----please consider outpatient or inpatient alcohol rehab program  3) follow-up with gastroenterologist Dr. Gala Romney in about  6 weeks  4) repeat CMP and CBC blood test the primary care physician within a week  5) there has been some changes to your medications please take medications as prescribed  Admission Diagnosis  Hepatic encephalopathy ([place]) [K72.90] GI bleed [K92.2] GIB (gastrointestinal bleeding) [K92.2] Acute GI bleeding [K92.2] S/P TIPS (transjugular intrahepatic portosystemic shunt) [J69.678]   Discharge Diagnosis  Hepatic encephalopathy ([name]) [K72.90] GI bleed [K92.2] GIB (gastrointestinal bleeding) [K92.2] Acute GI bleeding [K92.2] S/P TIPS (transjugular intrahepatic portosystemic shunt) [L38.101]    Active Problems:   TOBACCO ABUSE   Mixed hyperlipidemia   COPD, mild (HCC)   Acute hepatic encephalopathy   Alcoholic cirrhosis of liver with ascites (HCC)   Portal hypertension (HCC)   Acute blood loss anemia   Alcohol abuse   GIB (gastrointestinal bleeding)   Acute GI bleeding      Past Medical History:  Diagnosis Date  . Anxiety   . Controlled type 2 diabetes mellitus with complication, without long-term current use of insulin ([place]) 06/26/2020  . Enlarged liver    . GERD (gastroesophageal reflux disease)   . Hypertension   . Palpitations   . PVC's (premature ventricular contractions)   . Shortness of breath   . Tussive syncope 12/22/2019  . Varicose veins     Past Surgical History:  Procedure Laterality Date  . BIOPSY  02/02/2019   Procedure: BIOPSY;  Surgeon: Daneil Dolin, MD;  [facility];  Service: Endoscopy;;  gastric  . COLONOSCOPY WITH ESOPHAGOGASTRODUODENOSCOPY (EGD)  12/16/2012   internal hemorrhoids, colonic diverticulosis, benign polyps, screening in 2024. EGD with mild erosive reflux esophagitis, small hiatal hernia, negative H.pylori  . cyst reomved     from spine  . ESOPHAGOGASTRODUODENOSCOPY  05/19/09   RMR: Geographic distal esophageal erosions consistent with severe erosive reflux esophagitis. schatzki's ring s/P dilation/small hiatal hernia otherwise normal stomach  . ESOPHAGOGASTRODUODENOSCOPY (EGD) WITH PROPOFOL N/A 02/02/2019   Dr. Gala Romney: retained gastric contents. portal HTN gastropathy  . ESOPHAGOGASTRODUODENOSCOPY (EGD) WITH PROPOFOL N/A 11/20/2019   Procedure: ESOPHAGOGASTRODUODENOSCOPY (EGD) WITH PROPOFOL;  Surgeon: Daneil Dolin, MD;  [facility];  Service: Endoscopy;  Laterality: N/A;  . ESOPHAGOGASTRODUODENOSCOPY (EGD) WITH PROPOFOL N/A 11/24/2019   Procedure: ESOPHAGOGASTRODUODENOSCOPY (EGD) WITH PROPOFOL;  Surgeon: Ronnette Juniper, MD;  Location: [place];  Service: Gastroenterology;  Laterality: N/A;  . IR ANGIOGRAM SELECTIVE EACH ADDITIONAL  VESSEL  11/22/2019  . IR ANGIOGRAM SELECTIVE EACH ADDITIONAL VESSEL  11/24/2019  . IR ANGIOGRAM SELECTIVE EACH ADDITIONAL VESSEL  11/24/2019  . IR ANGIOGRAM SELECTIVE EACH ADDITIONAL VESSEL  11/24/2019  . IR EMBO ART  VEN HEMORR LYMPH EXTRAV  INC GUIDE ROADMAPPING  11/22/2019  . IR EMBO ART  VEN HEMORR LYMPH EXTRAV  INC GUIDE ROADMAPPING  11/24/2019  . IR RADIOLOGIST EVAL & MGMT  12/29/2019  . IR TIPS  11/24/2019  . IR US GUIDE VASC ACCESS RIGHT   11/22/2019  . IR VENOGRAM RENAL UNI LEFT  11/22/2019  . RADIOLOGY WITH ANESTHESIA N/A 11/24/2019   Procedure: IR WITH ANESTHESIA;  Surgeon: Radiologist, Medication, MD;  Location: [place];  Service: Radiology;  Laterality: N/A;  . TIPS PROCEDURE N/A 11/22/2019   Procedure: BRTO/TRANS-JUGULAR INTRAHEPATIC PORTAL SHUNT (TIPS);  Surgeon: Corrie Mckusick, DO;  Location: [place];  Service: Anesthesiology;  Laterality: N/A;  . WISDOM TOOTH EXTRACTION       HPI  from the history and physical done on the day of admission:    GI bleed  HPI: This is a 54 year old known alcoholic gentleman with liver cirrhosis and recent 11/2019 GI bleed requiring TIPS procedure and S/p TIPS with embolization of gastric variceal supply.  Since his TIPS he has done well but he has continued to drink and apparently has been noncompliant with his medications.  He presents today with complaints of hematemesis and melena for the past few days.  He started off with melena but developed hematemesis today.  In the ER melanotic stools noted.  He is somewhat encephalopathic with an ammonia level of 108.  His hemoglobin currently is 12.6 but his platelets are low at 78.  His INR is 1.5.  The patient is a poor historian given his encephalopathy as well as the fact that he recently received IV Ativan.  The patient is arousable but goes right back to sleep.  Review of Systems:  The patient denies anorexia, fever, weight loss,, vision loss, decreased hearing, hoarseness, chest pain, syncope, dyspnea on exertion, peripheral edema, balance deficits, abdominal pain, hematochezia, severe indigestion/heartburn, hematuria, incontinence, genital sores, muscle weakness, suspicious skin lesions, transient blindness, difficulty walking, depression, unusual weight change, abnormal bleeding, enlarged lymph nodes, angioedema, and breast masses. Positives: Hematemesis, melena   Hospital Course:   Brief Summary:- 54 year old male with history of  decompensated alcoholiccirrhosis complicated by portal hypertension with esophageal varices, gastric varices, previous GI bleed status post TIPS as well as BRTOadmitted on 07/30/2020 with acute GI bleed in the setting ofhematemesis and melena.  A/p 1)Acute GI bleed---hematemesis and melena noted -CTA with no active bleeding however there is evidence consistent with previous intervention and cirrhotic changes - Hgb 12.6 >>12.1 >>11.7>>11.0 >> 10.8 >> 10.7>>10.8>>10.6 --Treated with IV octreotide for possible varices -Treated with IV Protonix, discharged home on p.o. Dexilant -INR is 1.7, received vitamin K, repeat INR down to 1.3 -GI consult appreciated  -- EGD on Monday, 08/08/2020 as noted below without active bleeding -Patient was advanced from clear liquid diet to solids  2)Decompensated liver cirrhosis with hepatic encephalopathy----status post priorTIPsand BRTO --- Ultrasound TIPs eval showsantegrade flow through the [place] ----ammonia trending up108>>121>>56 after lactulose -Compliance with lactulose, rifaximin and nadolol advised Treated with-IV Rocephin for SBP prophylaxis in the setting of acute GI and possible variceal bleeding--- discharge on Omnicef  3)Etoh Abuse-- PTA patient was drinking heavily,up to two fifths of liquor daily. --High risk for DTs,--lorazepam per CIWA protocol multivitamin as ordered -Continue multivitamin -Outpatient  or inpatient alcohol rehab program advised  4)COPD/tobacco abuse--no acute exacerbation, continue bronchodilators, smoking cessation advised  5)DM2-recent A1c 6.9 reflecting excellent DM control PTA=----resume PTA meds  6)Hypomagnesemia--replaced and recheck  Disposition--discharge home with wife  Code Status : full code  Family Communication:   (patient is alert, awake and coherent) *  -Discussed with wife at bedside  Consults  :  Gi  Discharge Condition: Stable  Follow UP--- GI in 6 weeks  Diet and  Activity recommendation:  As advised  Discharge Instructions    Discharge Instructions    Diet - low sodium heart healthy   Complete by: As directed    Discharge instructions   Complete by: As directed    1)Avoid ibuprofen/Advil/Aleve/Motrin/Goody Powders/Naproxen/BC powders/Meloxicam/Diclofenac/Indomethacin and other Nonsteroidal anti-inflammatory medications as these will make you more likely to bleed and can cause stomach ulcers, can also cause Kidney problems.   2) complete abstinence from alcohol or complete abstinence from tobacco strongly advised----please consider outpatient or inpatient alcohol rehab program  3) follow-up with gastroenterologist Dr. Gala Romney in about  6 weeks  4) repeat CMP and CBC blood test the primary care physician within a week  5) there has been some changes to your medications please take medications as prescribed   Increase activity slowly   Complete by: As directed         Discharge Medications     Allergies as of 08/08/2020   No Known Allergies     Medication List    TAKE these medications   allopurinol 100 MG tablet Commonly known as: ZYLOPRIM TAKE 1 TABLET BY MOUTH EVERY DAY   b complex vitamins tablet Take 1 tablet by mouth daily.   blood glucose meter kit and supplies Dispense based on patient and insurance preference. Test blood sugar once daily.  (FOR ICD-10 E10.9, E11.9).   cefdinir 300 MG capsule Commonly known as: OMNICEF Take 1 capsule (300 mg total) by mouth 2 (two) times daily for 2 days. Start 08/09/20 Start taking on: August 09, 2020   D3-1000 25 MCG (1000 UT) capsule Generic drug: Cholecalciferol Take 1,000 Units by mouth daily.   Dexilant 60 MG capsule Generic drug: dexlansoprazole Take 1 capsule (60 mg total) by mouth daily.   ferrous sulfate 325 (65 FE) MG EC tablet Take 1 tablet (325 mg total) by mouth 2 (two) times daily.   folic acid 779 MCG tablet Commonly known as: FOLVITE Take 400 mcg by mouth  daily.   furosemide 40 MG tablet Commonly known as: Lasix Take 1 tablet (40 mg total) by mouth daily as needed for edema. Take Daily for 5 Days, Then Take Daily Only As Needed for Edema   gabapentin 600 MG tablet Commonly known as: Neurontin Take 1 tablet (600 mg total) by mouth 3 (three) times daily. What changed:   how much to take  when to take this   lactulose (encephalopathy) 10 GM/15ML Soln Commonly known as: CHRONULAC Take 15-30cc 1-2 daily to Titrate for 3-4 soft bowel movements a day   LORazepam 1 MG tablet Commonly known as: ATIVAN Take 1 mg by mouth as needed for anxiety.   multivitamin with minerals Tabs tablet Take 1 tablet by mouth daily.   nadolol 40 MG tablet Commonly known as: Corgard Take 1.5 tablets (60 mg total) by mouth daily.   naltrexone 50 MG tablet Commonly known as: DEPADE Take 1 tablet p.o. daily and on every 6th day take 2 tablets.   ondansetron 4 MG disintegrating tablet Commonly known  as: ZOFRAN-ODT DISSOLVE 1 TABLET ON THE TONGUE EVERY 8 HOURS AS NEEDED FOR NAUSEA What changed: See the new instructions.   PARoxetine 20 MG tablet Commonly known as: PAXIL TAKE 1 TABLET(20 MG) BY MOUTH DAILY What changed: See the new instructions.   potassium chloride SA 20 MEQ tablet Commonly known as: KLOR-CON Take 1 tablet (20 mEq total) by mouth daily as needed (Take when taking Lasix).   rifaximin 550 MG Tabs tablet Commonly known as: XIFAXAN Take 1 tablet (550 mg total) by mouth 2 (two) times daily.   sildenafil 100 MG tablet Commonly known as: VIAGRA TAKE 1/2 TABLET BY MOUTH EVERY DAY What changed:   when to take this  reasons to take this   sitaGLIPtin 25 MG tablet Commonly known as: Januvia Take 1 tablet (25 mg total) by mouth daily.   sucralfate 1 g tablet Commonly known as: CARAFATE TAKE 1 TABLET BY MOUTH BEFORE MEALS What changed: See the new instructions.   traZODone 50 MG tablet Commonly known as: DESYREL TAKE 1 TABLET  BY MOUTH AT BEDTIME AS NEEDED FOR INSOMNIA       Major procedures and Radiology Reports - PLEASE review detailed and final reports for all details, in brief -   DG Chest Port 1 View  Result Date: 08/05/2020 CLINICAL DATA:  54 year old male with GI bleed. EXAM: PORTABLE CHEST 1 VIEW COMPARISON:  Chest radiograph dated 11/28/2019. FINDINGS: No focal consolidation, pleural effusion, pneumothorax. The cardiac silhouette is within limits. No acute osseous pathology. IMPRESSION: No active disease. Electronically Signed   By: Anner Crete M.D.   On: 08/05/2020 00:33   US ABDOMINAL PELVIC ART/VENT FLOW DOPPLER LIMITED  Result Date: 08/05/2020 CLINICAL DATA:  Cirrhosis, GI bleeding, now post tips creation 11/24/2019. EXAM: DUPLEX ULTRASOUND OF LIVER AND TIPS SHUNT TECHNIQUE: Color and duplex Doppler ultrasound was performed to evaluate the hepatic in-flow and out-flow vessels. COMPARISON:  12/29/2019 FINDINGS: Portal Vein Velocities Main:  42 cm/sec Right:  105 cm/sec Left: Color Doppler suggests retrograde hepatofugal flow, however doppler waveforms were not obtained. TIPS Stent Velocities Proximal:  136 cm/sec Mid:  130 Distal:  181 cm/sec IVC: Present and patent with normal respiratory phasicity. Hepatic Vein Velocities Right:  165 cm/sec Ascities: None seen Varices: None demonstrated Other findings: IMPRESSION: Stable antegrade flow through the TIPS circuit. Electronically Signed   By: Lucrezia Europe M.D.   On: 08/05/2020 13:30   CT Angio Abd/Pel W and/or Wo Contrast  Result Date: 08/05/2020 CLINICAL DATA:  Cirrhosis with GI bleeding. EXAM: CTA ABDOMEN AND PELVIS WITHOUT AND WITH CONTRAST TECHNIQUE: Multidetector CT imaging of the abdomen and pelvis was performed using the standard protocol during bolus administration of intravenous contrast. Multiplanar reconstructed images and MIPs were obtained and reviewed to evaluate the vascular anatomy. CONTRAST:  147m OMNIPAQUE IOHEXOL 350 MG/ML SOLN COMPARISON:   None. FINDINGS: VASCULAR Aorta: Mild atheromatous plaque.  No dissection or aneurysm. Celiac: Partially obscured branches due to embolization coils for cirrhotic related varices. Hypertrophic hepatic artery. No evidence of branch occlusion or aneurysm. SMA: The SMA and its branches are smooth and widely patent. No evidence of aneurysm. Renals: Vessels are smooth and widely patent. Accessory renal artery on the right. IMA: Patent. Inflow: Atheromatous plaque without stenosis, dissection, or aneurysm. Proximal Outflow: Atherosclerosis without dissection or aneurysm. Veins: The systemic and portal venous system is essentially unopacified on this arterial study. Review of the MIP images confirms the above findings. NON-VASCULAR Lower chest: No acute finding Hepatobiliary: Cirrhotic liver morphology with  TIPS. Without venous phase opacification TIPS patency is uncertain. Cholelithiasis without findings of acute cholecystitis. Pancreas: Negative Spleen: Enlarged in the setting of portal hypertension. Adrenals/Urinary Tract: Negative adrenals. Symmetric normal renal enhancement. Negative urinary bladder. Stomach/Bowel: Mild thickening of the ascending colon with adjacent mesenteric haziness, likely related to portal hypertension. No suspected bowel inflammation. No bowel obstruction or appendicitis Lymphatic: Negative for adenopathy or retroperitoneal mass Reproductive: Negative Other: Small volume ascites mainly seen about the upper quadrants. 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 IMPRESSION: 1. No evidence of active arterial bleeding. 2. Cirrhosis with TIPS and variceal coiling. Stent patency cannot be established with arterial timing. 3. Small volume ascites. 4. Cholelithiasis. 5.  Aortic Atherosclerosis (ICD10-I70.0). Electronically Signed   By: Monte Fantasia M.D.   On: 08/05/2020 04:35    Micro Results   Recent Results (from  the past 240 hour(s))  SARS Coronavirus 2 by RT PCR (hospital order, performed in [facility] hospital lab) Nasopharyngeal Nasopharyngeal Swab     Status: None   Collection Time: 08/05/20  1:06 AM   Specimen: Nasopharyngeal Swab  Result Value Ref Range Status   SARS Coronavirus 2 NEGATIVE NEGATIVE Final    Comment: (NOTE) SARS-CoV-2 target nucleic acids are NOT DETECTED.  The SARS-CoV-2 RNA is generally detectable in upper and lower respiratory specimens during the acute phase of infection. The lowest concentration of SARS-CoV-2 viral copies this assay can detect is 250 copies / mL. A negative result does not preclude SARS-CoV-2 infection and should not be used as the sole basis for treatment or other patient management decisions.  A negative result may occur with improper specimen collection / handling, submission of specimen other than nasopharyngeal swab, presence of viral mutation(s) within the areas targeted by this assay, and inadequate number of viral copies (<250 copies / mL). A negative result must be combined with clinical observations, patient history, and epidemiological information.  Fact Sheet for Patients:   StrictlyIdeas.no  Fact Sheet for Healthcare Providers: BankingDealers.co.za  This test is not yet approved or  cleared by the [place] FDA and has been authorized for detection and/or diagnosis of SARS-CoV-2 by FDA under an Emergency Use Authorization (EUA).  This EUA will remain in effect (meaning this test can be used) for the duration of the COVID-19 declaration under Section 564(b)(1) of the Act, 21 U.S.C. section 360bbb-3(b)(1), unless the authorization is terminated or revoked sooner.  Performed at [facility], [address] [place], [place] [address]     Today   Subjective    Cashmere Harmes today has no new complaints          Patient has been seen and examined prior to discharge   Objective    Blood pressure 110/72, pulse 66, temperature 97.8 F (36.6 C), resp. rate 16, height _0  (1.854 m), weight 124.6 kg, SpO2 95 %.   Intake/Output Summary (Last 24 hours) at 08/08/2020 1257 Last data filed at 08/08/2020 1105 Gross per 24 hour  Intake 960 ml  Output 0 ml  Net 960 ml   Exam Gen:- Awake Alert, no acute distress HEENT:- [place].AT, No sclera icterus Neck-Supple Neck,No JVD,.  Lungs-  CTAB , good air movement bilaterally  CV- S1, S2 normal, regular Abd-  +ve B.Sounds, Abd Soft, No tenderness, increased truncal adiposity Extremity/Skin:- No  edema,   good pulses Psych-affect is appropriate, oriented x3 Neuro-no new focal deficits, no tremors    Data Review  CBC w Diff:  Lab Results  Component Value Date   WBC 3.0 (L) 08/08/2020   HGB 10.6 (L) 08/08/2020   HGB 18.2 (H) 06/20/2020   HCT 32.2 (L) 08/08/2020   HCT 48.1 06/20/2020   PLT 53 (L) 08/08/2020   PLT 57 (LL) 06/20/2020   LYMPHOPCT 13 08/05/2020   MONOPCT 15 08/05/2020   EOSPCT 5 08/05/2020   BASOPCT 1 08/05/2020    CMP:  Lab Results  Component Value Date   NA 141 08/08/2020   NA 143 06/20/2020   K 3.3 (L) 08/08/2020   CL 107 08/08/2020   CO2 27 08/08/2020   BUN <5 (L) 08/08/2020   BUN 3 (L) 06/20/2020   CREATININE 0.49 (L) 08/08/2020   CREATININE 0.74 08/19/2019   PROT 5.1 (L) 08/08/2020   PROT 7.4 06/20/2020   ALBUMIN 2.0 (L) 08/08/2020   ALBUMIN 3.2 (L) 06/20/2020   BILITOT 2.9 (H) 08/08/2020   BILITOT 3.7 (H) 06/20/2020   ALKPHOS 133 (H) 08/08/2020   AST 87 (H) 08/08/2020   ALT 26 08/08/2020  .   Total Discharge time is about 33 minutes  Roxan Hockey M.D on 08/08/2020 at 12:57 PM  Go to [email or web address] -  for contact info  [facility] - Office  [phone]

## 2020-08-09 ENCOUNTER — Encounter (HOSPITAL_COMMUNITY): Payer: Self-pay | Admitting: Internal Medicine

## 2020-08-11 ENCOUNTER — Telehealth: Payer: Self-pay | Admitting: Internal Medicine

## 2020-08-11 NOTE — Telephone Encounter (Signed)
Pt's wife called to say that patient had a procedure recently and was discharged from the hospital with a return to work note from the hospitalist. She said there was a typo on the note and she can't get in touch with anyone to correct it and wanted to know if Dr Gala Romney or Walden Field, NP would re-write on for him and he will need it tomorrow. Then she went on the say his ammonia levels were low and he was still taking lactulose. Please advise and call [phone]

## 2020-08-11 NOTE — Telephone Encounter (Signed)
Routing to [employer] as an [employer]

## 2020-08-11 NOTE — Telephone Encounter (Signed)
Lmom, waiting on a return call.  

## 2020-08-12 NOTE — Telephone Encounter (Signed)
Did she say what the typo is?

## 2020-08-12 NOTE — Telephone Encounter (Signed)
Left a message for pts spouse today. Waiting on a response from her.

## 2020-08-16 ENCOUNTER — Other Ambulatory Visit: Payer: Self-pay | Admitting: Neurology

## 2020-08-16 ENCOUNTER — Other Ambulatory Visit: Payer: Self-pay | Admitting: Family Medicine

## 2020-08-16 ENCOUNTER — Telehealth: Payer: Self-pay | Admitting: *Deleted

## 2020-08-16 NOTE — Telephone Encounter (Signed)
I'm glad he's getting treatment!  Cc to the other APPs for FYI/update (as many have seen him)

## 2020-08-16 NOTE — Telephone Encounter (Signed)
Noted. Will respond when we can get in touch with them.

## 2020-08-16 NOTE — Telephone Encounter (Signed)
Spouse ([name]) called to inform us that pt is in a treatment center in [place] and will probably be there for 2 months.

## 2020-08-23 ENCOUNTER — Telehealth: Payer: Self-pay

## 2020-08-23 DIAGNOSIS — K7031 Alcoholic cirrhosis of liver with ascites: Secondary | ICD-10-CM

## 2020-08-23 DIAGNOSIS — K7682 Hepatic encephalopathy: Secondary | ICD-10-CM

## 2020-08-23 DIAGNOSIS — K766 Portal hypertension: Secondary | ICD-10-CM

## 2020-08-23 MED ORDER — RIFAXIMIN 550 MG PO TABS: 550.0000 mg | ORAL_TABLET | Freq: Two times a day (BID) | ORAL | 5 refills | Status: DC

## 2020-08-23 NOTE — Telephone Encounter (Signed)
Pt's spouse called to see if our office could write a script for Xifaxan and complete the PA form. Pt was prescribed this medication at the ED and hasn't gotten the script due to it needing a PA. Looks like the ED doctor sent this RX in for.

## 2020-08-23 NOTE — Addendum Note (Signed)
Addended by: Gordy Levan, Giovanni Bath A on: 08/23/2020 08:46 PM   Modules accepted: Orders

## 2020-08-23 NOTE — Telephone Encounter (Signed)
Rx sent to pharmacy which should trigger PA request

## 2020-08-24 ENCOUNTER — Telehealth: Payer: Self-pay

## 2020-08-24 NOTE — Telephone Encounter (Signed)
I spoke with the pt's wife, she needed pts paperwork faxed to a different fax number. [phone])  Apparently the fax number she was given initially was wrong. Paperwork has been faxed.

## 2020-08-24 NOTE — Telephone Encounter (Signed)
Noted. Left a detailed message for pts spouse. Will look out for PA.

## 2020-08-24 NOTE — Telephone Encounter (Signed)
Pt's spouse called and would like a return call to discuss paperwork needed for pt.

## 2020-09-21 ENCOUNTER — Ambulatory Visit: Payer: Managed Care, Other (non HMO) | Admitting: Family Medicine

## 2020-10-06 ENCOUNTER — Other Ambulatory Visit: Payer: Managed Care, Other (non HMO)

## 2020-10-06 ENCOUNTER — Telehealth: Payer: Self-pay

## 2020-10-06 NOTE — Telephone Encounter (Signed)
Nurses Furosemide in the medical chart is listed as a as needed medicine Please find out specifically from the wife how is she giving the furosemide? Is she currently giving the chlorthalidone every day? And then nadolol how is she giving that?  Once this is clarified please forward this to Dr. Lovena Le When the patient does do a follow-up I recommend that the patient bring all pill bottles with them Also I would recommend the patient do a CMP, magnesium early next week  Thanks-Dr. Nicki Reaper

## 2020-10-06 NOTE — Telephone Encounter (Signed)
Pt wife call husband was in a Administrator, sports program in [place] and they are sending records to Dr Lovena Le. Treatment Program changed medication CHLORTHALIDONE 50 mg takes once a day. Problem there is not instructions sent home does she give him this or nadolol (CORGARD) 40 MG tablet 60mg  is what he takes, furosemide (LASIX) 40 MG tablet 20mg  since these Meds are conflicting his wife is a retired Marine scientist and is concerned with all this together. Pharmacy did not catch this wife is needing instructions what to do.  Wife call back [phone] ([name])

## 2020-10-07 ENCOUNTER — Ambulatory Visit: Payer: Managed Care, Other (non HMO) | Admitting: Family Medicine

## 2020-10-07 NOTE — Telephone Encounter (Signed)
Left message to return call 

## 2020-10-10 NOTE — Telephone Encounter (Signed)
Pt wife returned call unable to reach a nurse

## 2020-10-11 NOTE — Telephone Encounter (Signed)
Wife states patient was taking furosemide daily while in [place] but has not been on it for 5 days now (will use prn).   Directions for chlorthalidone was to take daily- patient has not had this since being home because wife was unsure what to do.  Patient has continued to take Nadolol daily.   Please advise recommendations.   Dr. Nicki Reaper recommended doing a CMP and magnesium next week, wife is requesting A1c and ammonia level. Please advise if any other labs are needed.

## 2020-10-11 NOTE — Telephone Encounter (Signed)
Bring in this week or tomorrow if possible. Thx. Dr. Lovena Le

## 2020-10-11 NOTE — Telephone Encounter (Signed)
Pls bring pt in for f/u after his rehab and to review his medications. Thanks, dr. Lovena Le

## 2020-10-12 NOTE — Telephone Encounter (Signed)
Pls order the labs below from Dr. Bary Leriche notes.   Pls call to see if they can check his blood pressure at home to make sure it's not too low with all the bp meds.   Wanting it around 120-130 /80.    Thx,   Dr. Lovena Le

## 2020-10-12 NOTE — Telephone Encounter (Signed)
Wife wants to apologize she is aware this was confusing. Her main frustration was the pharmacy sending him off with all of those medications together. Wife and patient are both comfortable with him continuing with the meds he was on prior to rehab and he is not having any issues right now but will call to make appointment if anything changes. He will keep his appointment 11/15.

## 2020-10-14 ENCOUNTER — Other Ambulatory Visit: Payer: Self-pay | Admitting: *Deleted

## 2020-10-14 DIAGNOSIS — Z79899 Other long term (current) drug therapy: Secondary | ICD-10-CM

## 2020-10-14 DIAGNOSIS — R7989 Other specified abnormal findings of blood chemistry: Secondary | ICD-10-CM

## 2020-10-14 DIAGNOSIS — R945 Abnormal results of liver function studies: Secondary | ICD-10-CM

## 2020-10-14 NOTE — Telephone Encounter (Signed)
Wife notified, states bp have been running around 118/78 or a little higher. She will keep an eye on this.

## 2020-10-14 NOTE — Telephone Encounter (Signed)
Ok this bp looks good. Dr. Lovena Le

## 2020-10-24 ENCOUNTER — Other Ambulatory Visit: Payer: Self-pay

## 2020-10-24 ENCOUNTER — Encounter: Payer: Self-pay | Admitting: Family Medicine

## 2020-10-24 ENCOUNTER — Ambulatory Visit
Admission: RE | Admit: 2020-10-24 | Discharge: 2020-10-24 | Disposition: A | Discharge status: Home / Self Care | Payer: Managed Care, Other (non HMO) | Source: Ambulatory Visit | Attending: Interventional Radiology | Admitting: Interventional Radiology

## 2020-10-24 ENCOUNTER — Ambulatory Visit (INDEPENDENT_AMBULATORY_CARE_PROVIDER_SITE_OTHER): Payer: Managed Care, Other (non HMO) | Admitting: Family Medicine

## 2020-10-24 VITALS — BP 130/64 | HR 70 | Temp 97.2°F | Ht 73.0 in | Wt 265.4 lb

## 2020-10-24 DIAGNOSIS — K7031 Alcoholic cirrhosis of liver with ascites: Secondary | ICD-10-CM | POA: Diagnosis not present

## 2020-10-24 DIAGNOSIS — G621 Alcoholic polyneuropathy: Secondary | ICD-10-CM

## 2020-10-24 DIAGNOSIS — I8501 Esophageal varices with bleeding: Secondary | ICD-10-CM

## 2020-10-24 DIAGNOSIS — F1010 Alcohol abuse, uncomplicated: Secondary | ICD-10-CM

## 2020-10-24 DIAGNOSIS — E118 Type 2 diabetes mellitus with unspecified complications: Secondary | ICD-10-CM | POA: Diagnosis not present

## 2020-10-24 DIAGNOSIS — G629 Polyneuropathy, unspecified: Secondary | ICD-10-CM | POA: Diagnosis not present

## 2020-10-24 NOTE — Progress Notes (Signed)
Patient ID: Bradley Miles, male    DOB: 01-21-1966, 54 y.o.   MRN: [id number]   Chief Complaint  Patient presents with  . Cirrhosis  . Diabetes   Subjective:    HPI   F/u alcoholism, DM2, and cirrhosis.  Pt seen for f/u after discharge from alcohol rehab and f/u DM2. Went to alcohol treatment on 08/14/20. "Went well" pt stating was more intense and was away from his family.   Went to [place] and stayed 45 days. Had 1 bad day recently, about 1 wk ago. Pt stating has restarted drinking 1-2 per day. Pt was detoxed when he was at rehab.  Going to [facility] now. Went to rehab in [place] 2x in past. 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  Taking Lasix, Nadolol and Naltrexone. Pt wife is wanting to if Nadolol and Naltrexone are in conflict.  Only taking lasix as needed.  No interacting naltrexone and Nadalol per the drug checker.  Had stent put in the liver recently, went to GI to have it evaluated.  Did image to evaluate the stent.  Pt not on gabapentin, due to rehab not allowing it. Was given this per the neurologist for neuropathy, with Dr. Eugenie Birks.   Medical History [place] has a past medical history of Anxiety, Controlled type 2 diabetes mellitus with complication, without long-term current use of insulin ([place]) (06/26/2020), Enlarged liver, GERD (gastroesophageal reflux disease), Hypertension, Palpitations, PVC's (premature ventricular contractions), Shortness of breath, Tussive syncope (12/22/2019), and Varicose veins.   Outpatient Encounter Medications as of 10/24/2020  Medication Sig  . allopurinol (ZYLOPRIM) 100 MG tablet TAKE 1 TABLET BY MOUTH EVERY DAY (Patient taking differently: Take 100 mg by mouth daily. )  . b complex vitamins tablet Take 1 tablet by mouth daily.  . blood glucose meter kit and supplies Dispense based on patient and insurance preference. Test blood sugar once daily.  (FOR ICD-10 E10.9, E11.9).  Marland Kitchen Cholecalciferol (D3-1000) 25 MCG (1000 UT) capsule Take 1,000  Units by mouth daily.  Marland Kitchen dexlansoprazole (DEXILANT) 60 MG capsule Take 1 capsule (60 mg total) by mouth daily.  . folic acid (FOLVITE) 282 MCG tablet Take 400 mcg by mouth daily.  . furosemide (LASIX) 40 MG tablet Take 0.5 tablets (20 mg total) by mouth daily. Take Daily for 5 Days, Then Take Daily Only As Needed for Edema  . gabapentin (NEURONTIN) 600 MG tablet TAKE 1 TABLET(600 MG) BY MOUTH THREE TIMES DAILY  . lactulose, encephalopathy, (CHRONULAC) 10 GM/15ML SOLN Take 15-30cc 1-2 daily to Titrate for 3-4 soft bowel movements a day  . LORazepam (ATIVAN) 1 MG tablet Take 1 mg by mouth as needed for anxiety.  . Multiple Vitamin (MULTIVITAMIN WITH MINERALS) TABS tablet Take 1 tablet by mouth daily.  . nadolol (CORGARD) 40 MG tablet Take 1.5 tablets (60 mg total) by mouth daily.  . naltrexone (DEPADE) 50 MG tablet Take 1 tablet p.o. daily and on every 6th day take 2 tablets.  . ondansetron (ZOFRAN-ODT) 4 MG disintegrating tablet DISSOLVE 1 TABLET ON THE TONGUE EVERY 8 HOURS AS NEEDED FOR NAUSEA (Patient taking differently: Take 4 mg by mouth every 8 (eight) hours as needed for nausea or vomiting. )  . PARoxetine (PAXIL) 20 MG tablet Take 1 tablet (20 mg total) by mouth daily.  . potassium chloride SA (KLOR-CON) 20 MEQ tablet Take 1 tablet (20 mEq total) by mouth daily as needed (Take when taking Lasix).  . rifaximin (XIFAXAN) 550 MG TABS tablet Take 1 tablet (550 mg  total) by mouth 2 (two) times daily.  . rifaximin (XIFAXAN) 550 MG TABS tablet Take 1 tablet (550 mg total) by mouth 2 (two) times daily.  . sildenafil (VIAGRA) 100 MG tablet TAKE 1/2 TABLET BY MOUTH EVERY DAY (Patient taking differently: Take 50 mg by mouth as needed for erectile dysfunction. )  . sitaGLIPtin (JANUVIA) 25 MG tablet Take 1 tablet (25 mg total) by mouth daily.  . sucralfate (CARAFATE) 1 g tablet TAKE 1 TABLET BY MOUTH BEFORE MEALS (Patient taking differently: Take 1 g by mouth daily. )  . traZODone (DESYREL) 50 MG tablet  TAKE 1 TABLET BY MOUTH AT BEDTIME AS NEEDED FOR INSOMNIA  . ferrous sulfate 325 (65 FE) MG EC tablet Take 1 tablet (325 mg total) by mouth 2 (two) times daily. (Patient not taking: Reported on 08/05/2020)  . ferrous sulfate 325 (65 FE) MG EC tablet Take 1 tablet (325 mg total) by mouth 2 (two) times daily.   No facility-administered encounter medications on file as of 10/24/2020.     Review of Systems  Constitutional: Negative for chills and fever.  HENT: Negative for congestion, rhinorrhea and sore throat.   Respiratory: Negative for cough, shortness of breath and wheezing.   Cardiovascular: Negative for chest pain and leg swelling.  Gastrointestinal: Negative for abdominal pain, diarrhea, nausea and vomiting.  Genitourinary: Negative for dysuria and frequency.  Skin: Negative for rash.  Neurological: Negative for dizziness, weakness and headaches.     Vitals BP 130/64   Pulse 70   Temp (!) 97.2 F (36.2 C)   Ht 6' 1"  (1.854 m)   Wt 265 lb 6.4 oz (120.4 kg)   SpO2 98%   BMI 35.02 kg/m   Objective:   Physical Exam Vitals and nursing note reviewed.  Constitutional:      General: He is not in acute distress.    Appearance: Normal appearance. He is not ill-appearing.  HENT:     Head: Normocephalic.     Nose: Nose normal. No congestion.     Mouth/Throat:     Mouth: Mucous membranes are moist.     Pharynx: No oropharyngeal exudate.  Eyes:     Extraocular Movements: Extraocular movements intact.     Conjunctiva/sclera: Conjunctivae normal.     Pupils: Pupils are equal, round, and reactive to light.  Cardiovascular:     Rate and Rhythm: Normal rate and regular rhythm.     Pulses: Normal pulses.     Heart sounds: Normal heart sounds. No murmur heard.   Pulmonary:     Effort: Pulmonary effort is normal. No respiratory distress.     Breath sounds: Normal breath sounds. No wheezing, rhonchi or rales.  Musculoskeletal:        General: Normal range of motion.     Right  lower leg: No edema.     Left lower leg: No edema.  Skin:    General: Skin is warm and dry.     Findings: No rash.  Neurological:     General: No focal deficit present.     Mental Status: He is alert and oriented to person, place, and time.     Cranial Nerves: No cranial nerve deficit.  Psychiatric:        Mood and Affect: Mood normal.        Behavior: Behavior normal.        Thought Content: Thought content normal.        Judgment: Judgment normal.  Assessment and Plan   1. Controlled type 2 diabetes mellitus with complication, without long-term current use of insulin ([facility]) - Hemoglobin A1c - Lipid panel - Microalbumin, urine  2. Alcoholic cirrhosis of liver with ascites (HCC) - Ammonia - CBC  3. Sensory neuropathy  4. Alcohol abuse  5. Alcoholic peripheral neuropathy (H[facility])   DM2-status unknown. Review labs- pt to get labs today.  And may need to increase the [place].  H/o neuropathy- Seeing Dr. Jannifer Franklin in 9/21, on gabapentin.  Pt stating wasn't able to have gabapentin while in rehab due to it being addictive. Pt was given 270 tab with 1 refill from Dr. Jannifer Franklin, neurologist.  Discussed the need to stop drinking alcohol.  Pt stating he "can stop the 1-2 drinks he's having per day."  Pt to continue to go to [facility] and counseling.  Pt in agreement with plan.  Reviewed that there aren't any interactions with naltrexone and nadolol per the drug checker.  Call or rto if having side effects of meds.   F/u 41mo

## 2020-10-24 NOTE — Progress Notes (Signed)
Pt has been out of rehab for about one month. Pt states that he has already been back to work. Pt had blood work done this morning. Pt also had appt with Dr.Wagner to have a look at the stint in liver.  Taking Lasix, Nadolol and Naltrexone. Pt wife is wanting to if Nadolol and Naltrexone are in conflict.     Patient ID: Bradley Miles, male    DOB: 1966-12-03, 54 y.o.   MRN: [id number]   No chief complaint on file.  Subjective:    HPI   Medical History [place] has a past medical history of Anxiety, Controlled type 2 diabetes mellitus with complication, without long-term current use of insulin ([place]) (06/26/2020), Enlarged liver, GERD (gastroesophageal reflux disease), Hypertension, Palpitations, PVC's (premature ventricular contractions), Shortness of breath, Tussive syncope (12/22/2019), and Varicose veins.   Outpatient Encounter Medications as of 10/24/2020  Medication Sig  . allopurinol (ZYLOPRIM) 100 MG tablet TAKE 1 TABLET BY MOUTH EVERY DAY (Patient taking differently: Take 100 mg by mouth daily. )  . b complex vitamins tablet Take 1 tablet by mouth daily.  . blood glucose meter kit and supplies Dispense based on patient and insurance preference. Test blood sugar once daily.  (FOR ICD-10 E10.9, E11.9).  Marland Kitchen Cholecalciferol (D3-1000) 25 MCG (1000 UT) capsule Take 1,000 Units by mouth daily.  Marland Kitchen dexlansoprazole (DEXILANT) 60 MG capsule Take 1 capsule (60 mg total) by mouth daily.  . folic acid (FOLVITE) 354 MCG tablet Take 400 mcg by mouth daily.  . furosemide (LASIX) 40 MG tablet Take 0.5 tablets (20 mg total) by mouth daily. Take Daily for 5 Days, Then Take Daily Only As Needed for Edema  . gabapentin (NEURONTIN) 600 MG tablet TAKE 1 TABLET(600 MG) BY MOUTH THREE TIMES DAILY  . lactulose, encephalopathy, (CHRONULAC) 10 GM/15ML SOLN Take 15-30cc 1-2 daily to Titrate for 3-4 soft bowel movements a day  . LORazepam (ATIVAN) 1 MG tablet Take 1 mg by mouth as needed for anxiety.  . Multiple  Vitamin (MULTIVITAMIN WITH MINERALS) TABS tablet Take 1 tablet by mouth daily.  . nadolol (CORGARD) 40 MG tablet Take 1.5 tablets (60 mg total) by mouth daily.  . naltrexone (DEPADE) 50 MG tablet Take 1 tablet p.o. daily and on every 6th day take 2 tablets.  . ondansetron (ZOFRAN-ODT) 4 MG disintegrating tablet DISSOLVE 1 TABLET ON THE TONGUE EVERY 8 HOURS AS NEEDED FOR NAUSEA (Patient taking differently: Take 4 mg by mouth every 8 (eight) hours as needed for nausea or vomiting. )  . PARoxetine (PAXIL) 20 MG tablet Take 1 tablet (20 mg total) by mouth daily.  . potassium chloride SA (KLOR-CON) 20 MEQ tablet Take 1 tablet (20 mEq total) by mouth daily as needed (Take when taking Lasix).  . rifaximin (XIFAXAN) 550 MG TABS tablet Take 1 tablet (550 mg total) by mouth 2 (two) times daily.  . rifaximin (XIFAXAN) 550 MG TABS tablet Take 1 tablet (550 mg total) by mouth 2 (two) times daily.  . sildenafil (VIAGRA) 100 MG tablet TAKE 1/2 TABLET BY MOUTH EVERY DAY (Patient taking differently: Take 50 mg by mouth as needed for erectile dysfunction. )  . sitaGLIPtin (JANUVIA) 25 MG tablet Take 1 tablet (25 mg total) by mouth daily.  . sucralfate (CARAFATE) 1 g tablet TAKE 1 TABLET BY MOUTH BEFORE MEALS (Patient taking differently: Take 1 g by mouth daily. )  . traZODone (DESYREL) 50 MG tablet TAKE 1 TABLET BY MOUTH AT BEDTIME AS NEEDED FOR INSOMNIA  .  ferrous sulfate 325 (65 FE) MG EC tablet Take 1 tablet (325 mg total) by mouth 2 (two) times daily. (Patient not taking: Reported on 08/05/2020)  . ferrous sulfate 325 (65 FE) MG EC tablet Take 1 tablet (325 mg total) by mouth 2 (two) times daily.   No facility-administered encounter medications on file as of 10/24/2020.     Review of Systems   Vitals BP 130/64   Pulse 70   Temp (!) 97.2 F (36.2 C)   Ht _0  (1.854 m)   Wt 265 lb 6.4 oz (120.4 kg)   SpO2 98%   BMI 35.02 kg/m   Objective:   Physical Exam   Assessment and Plan   There are no  diagnoses linked to this encounter.     Vicente Males, LPN [id number]

## 2020-10-25 ENCOUNTER — Telehealth: Payer: Self-pay | Admitting: Family Medicine

## 2020-10-25 ENCOUNTER — Other Ambulatory Visit: Payer: Self-pay | Admitting: Family Medicine

## 2020-10-25 LAB — CBC
Hematocrit: 36.4 % — ABNORMAL LOW (ref 37.5–51.0)
Hemoglobin: 12.5 g/dL — ABNORMAL LOW (ref 13.0–17.7)
MCH: 34.5 pg — ABNORMAL HIGH (ref 26.6–33.0)
MCHC: 34.3 g/dL (ref 31.5–35.7)
MCV: 101 fL — ABNORMAL HIGH (ref 79–97)
Platelets: 84 10*3/uL — CL (ref 150–450)
RBC: 3.62 x10E6/uL — ABNORMAL LOW (ref 4.14–5.80)
RDW: 13.4 % (ref 11.6–15.4)
WBC: 4.6 10*3/uL (ref 3.4–10.8)

## 2020-10-25 LAB — CMP14+EGFR
ALT: 20 IU/L (ref 0–44)
AST: 41 IU/L — ABNORMAL HIGH (ref 0–40)
Albumin/Globulin Ratio: 0.9 — ABNORMAL LOW (ref 1.2–2.2)
Albumin: 3.1 g/dL — ABNORMAL LOW (ref 3.8–4.9)
Alkaline Phosphatase: 113 IU/L (ref 44–121)
BUN/Creatinine Ratio: 13 (ref 9–20)
BUN: 11 mg/dL (ref 6–24)
Bilirubin Total: 1.4 mg/dL — ABNORMAL HIGH (ref 0.0–1.2)
CO2: 22 mmol/L (ref 20–29)
Calcium: 8.6 mg/dL — ABNORMAL LOW (ref 8.7–10.2)
Chloride: 100 mmol/L (ref 96–106)
Creatinine, Ser: 0.86 mg/dL (ref 0.76–1.27)
GFR calc Af Amer: 116 mL/min/{1.73_m2} (ref >59)
GFR calc non Af Amer: 100 mL/min/{1.73_m2} (ref >59)
Globulin, Total: 3.5 g/dL (ref 1.5–4.5)
Glucose: 350 mg/dL — ABNORMAL HIGH (ref 65–99)
Potassium: 3.9 mmol/L (ref 3.5–5.2)
Sodium: 137 mmol/L (ref 134–144)
Total Protein: 6.6 g/dL (ref 6.0–8.5)

## 2020-10-25 LAB — LIPID PANEL
Chol/HDL Ratio: 3.1 ratio (ref 0.0–5.0)
Cholesterol, Total: 216 mg/dL — ABNORMAL HIGH (ref 100–199)
HDL: 70 mg/dL (ref >39)
LDL Chol Calc (NIH): 121 mg/dL — ABNORMAL HIGH (ref 0–99)
Triglycerides: 143 mg/dL (ref 0–149)
VLDL Cholesterol Cal: 25 mg/dL (ref 5–40)

## 2020-10-25 LAB — AMMONIA: Ammonia: 67 ug/dL (ref 40–200)

## 2020-10-25 LAB — HEMOGLOBIN A1C
Est. average glucose Bld gHb Est-mCnc: 194 mg/dL
Hgb A1c MFr Bld: 8.4 % — ABNORMAL HIGH (ref 4.8–5.6)

## 2020-10-25 LAB — MAGNESIUM: Magnesium: 1.4 mg/dL — ABNORMAL LOW (ref 1.6–2.3)

## 2020-10-25 MED ORDER — SITAGLIPTIN PHOSPHATE 50 MG PO TABS
50.0000 mg | ORAL_TABLET | Freq: Every day | ORAL | 1 refills | Status: DC
Start: 2020-10-25 — End: 2021-08-23

## 2020-10-26 ENCOUNTER — Telehealth: Payer: Managed Care, Other (non HMO)

## 2020-10-27 ENCOUNTER — Other Ambulatory Visit: Payer: Self-pay

## 2020-10-27 ENCOUNTER — Encounter: Payer: Self-pay | Admitting: *Deleted

## 2020-10-27 ENCOUNTER — Ambulatory Visit
Admission: RE | Admit: 2020-10-27 | Discharge: 2020-10-27 | Disposition: A | Discharge status: Home / Self Care | Payer: Managed Care, Other (non HMO) | Source: Ambulatory Visit | Attending: Interventional Radiology | Admitting: Interventional Radiology

## 2020-10-27 DIAGNOSIS — I8501 Esophageal varices with bleeding: Secondary | ICD-10-CM

## 2020-10-27 HISTORY — PX: IR RADIOLOGIST EVAL & MGMT: IMG5224

## 2020-10-27 NOTE — Progress Notes (Signed)
Chief Complaint: Cirrhosis, Portal HTN, Prior variceal hemorrhage  Referring Physician(s): Dr. Gala Romney   PCP: Dr. Baltazar Apo  History of Present Illness: Bradley Miles is a 54 y.o. male presenting as a scheduled follow up to [place] clinic, SP BRTO & TIPS for emergent upper GI hemorrhage secondary to portal HTN performed 11/22/2019  He joins [place] today as a virtual visit.  We confirmed his identify with 2 personal identifiers.   History: Bradley Miles was admitted to [facility] 11/20/2019 through the ED with complaints of hematemesis, treated with EGD/clipping and medically in the ICU.  He has recurrence of bleeding, and ultimately was transferred to [facility].    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  TIPS and embolization performed 11/24/2019, 74m x 8cm Viatorr.   Interval history:  We saw Bradley Miles in our clinic 12/29/19.   He tells me that he was doing ok until he had recurrent episode of vomiting blood in August, admittedly from recurrent ETOH use. He was admitted to [facility] 08/05/20 for management.     He underwent upper endoscopy, which revealed no gastric varices to account for blood loss.  He does have esophageal varices, which do not account for the bleeding based on the upper endo.  The report states most likely source from portal gastropathy.   After his admission at [facility], [place].  He returned home in October, and was doing OK.  He admits to 2 recent dates of recurrent ETOH use, including first week of November, and on his recent birthday, [birth year].  Since then he has not had recurrent blood loss.   TIPS is patent on recent duplex 11/15.   He tells me that he takes his lactulose as needed, "when I think I havent moved my bowels often enough".   He is still  working, and able to perform all his ADL's. .  Past Medical History:  Diagnosis Date  . Anxiety   . Controlled type 2 diabetes mellitus with complication, without long-term current use of insulin ([place] 06/26/2020  . Enlarged liver   . GERD (gastroesophageal reflux disease)   . Hypertension   . Palpitations   . PVC's (premature ventricular contractions)   . Shortness of breath   . Tussive syncope 12/22/2019  . Varicose veins     Past Surgical History:  Procedure Laterality Date  . BIOPSY  02/02/2019   Procedure: BIOPSY;  Surgeon: RDaneil Dolin MD;  [facility];  Service: Endoscopy;;  gastric  . COLONOSCOPY WITH ESOPHAGOGASTRODUODENOSCOPY (EGD)  12/16/2012   internal hemorrhoids, colonic diverticulosis, benign polyps, screening in 2024. EGD with mild erosive reflux esophagitis, small hiatal hernia, negative H.pylori  . cyst reomved     from spine  . ESOPHAGOGASTRODUODENOSCOPY  05/19/09   RMR: Geographic distal esophageal erosions consistent with severe erosive reflux esophagitis. schatzki's ring s/P dilation/small hiatal hernia otherwise normal stomach  . ESOPHAGOGASTRODUODENOSCOPY (EGD) WITH PROPOFOL N/A 02/02/2019   Dr. RGala Romney retained gastric contents. portal HTN gastropathy  . ESOPHAGOGASTRODUODENOSCOPY (EGD) WITH PROPOFOL N/A 11/20/2019   Procedure: ESOPHAGOGASTRODUODENOSCOPY (EGD) WITH PROPOFOL;  Surgeon: RDaneil Dolin MD;  [facility];  Service: Endoscopy;  Laterality: N/A;  . ESOPHAGOGASTRODUODENOSCOPY (EGD) WITH PROPOFOL N/A 11/24/2019   Procedure: ESOPHAGOGASTRODUODENOSCOPY (EGD) WITH PROPOFOL;  Surgeon: KRonnette Juniper MD;  [facility];  Service: Gastroenterology;  Laterality: N/A;  . ESOPHAGOGASTRODUODENOSCOPY (EGD) WITH PROPOFOL N/A 08/08/2020   Procedure: ESOPHAGOGASTRODUODENOSCOPY (EGD) WITH PROPOFOL;  Surgeon: Daneil Dolin, MD;  [facility];  Service: Endoscopy;  Laterality: N/A;  . IR ANGIOGRAM SELECTIVE EACH ADDITIONAL  VESSEL  11/22/2019  . IR ANGIOGRAM SELECTIVE EACH ADDITIONAL VESSEL  11/24/2019  . IR ANGIOGRAM SELECTIVE EACH ADDITIONAL VESSEL  11/24/2019  . IR ANGIOGRAM SELECTIVE EACH ADDITIONAL VESSEL  11/24/2019  . IR EMBO ART  VEN HEMORR LYMPH EXTRAV  INC GUIDE ROADMAPPING  11/22/2019  . IR EMBO ART  VEN HEMORR LYMPH EXTRAV  INC GUIDE ROADMAPPING  11/24/2019  . IR RADIOLOGIST EVAL & MGMT  12/29/2019  . IR TIPS  11/24/2019  . IR US GUIDE VASC ACCESS RIGHT  11/22/2019  . IR VENOGRAM RENAL UNI LEFT  11/22/2019  . RADIOLOGY WITH ANESTHESIA N/A 11/24/2019   Procedure: IR WITH ANESTHESIA;  Surgeon: Radiologist, Medication, MD;  Location: [place];  Service: Radiology;  Laterality: N/A;  . TIPS PROCEDURE N/A 11/22/2019   Procedure: BRTO/TRANS-JUGULAR INTRAHEPATIC PORTAL SHUNT (TIPS);  Surgeon: Corrie Mckusick, DO;  Location: [place];  Service: Anesthesiology;  Laterality: N/A;  . WISDOM TOOTH EXTRACTION      Allergies: Patient has no known allergies.  Medications: Prior to Admission medications   Medication Sig Start Date End Date Taking? Authorizing Provider  allopurinol (ZYLOPRIM) 100 MG tablet TAKE 1 TABLET BY MOUTH EVERY DAY Patient taking differently: Take 100 mg by mouth daily.  12/10/19   [name], MD  b complex vitamins tablet Take 1 tablet by mouth daily.    [provider]  blood glucose meter kit and supplies Dispense based on patient and insurance preference. Test blood sugar once daily.  (FOR ICD-10 E10.9, E11.9). 04/21/20   Erven Colla, DO  Cholecalciferol (D3-1000) 25 MCG (1000 UT) capsule Take 1,000 Units by mouth daily.    [provider]  dexlansoprazole (DEXILANT) 60 MG capsule Take 1 capsule (60 mg total) by mouth daily. 08/08/20   Roxan Hockey, MD  ferrous sulfate 325 (65 FE) MG EC tablet Take 1 tablet (325 mg total) by mouth 2 (two) times daily. Patient not taking: Reported on 08/05/2020 11/29/19 06/21/20  Elgergawy, Silver Huguenin, MD  ferrous sulfate 325 (65  FE) MG EC tablet Take 1 tablet (325 mg total) by mouth 2 (two) times daily. 08/08/20 09/07/20  Roxan Hockey, MD  folic acid (FOLVITE) 449 MCG tablet Take 400 mcg by mouth daily.    [provider]  furosemide (LASIX) 40 MG tablet Take 0.5 tablets (20 mg total) by mouth daily. Take Daily for 5 Days, Then Take Daily Only As Needed for Edema 08/08/20   Roxan Hockey, MD  gabapentin (NEURONTIN) 600 MG tablet TAKE 1 TABLET(600 MG) BY MOUTH THREE TIMES DAILY 08/16/20   Kathrynn Ducking, MD  lactulose, encephalopathy, (CHRONULAC) 10 GM/15ML SOLN Take 15-30cc 1-2 daily to Titrate for 3-4 soft bowel movements a day 08/08/20   Roxan Hockey, MD  LORazepam (ATIVAN) 1 MG tablet Take 1 mg by mouth as needed for anxiety.    [provider]  Multiple Vitamin (MULTIVITAMIN WITH MINERALS) TABS tablet Take 1 tablet by mouth daily. 11/30/19   Elgergawy, Silver Huguenin, MD  nadolol (CORGARD) 40 MG tablet Take 1.5 tablets (60 mg total) by mouth daily. 08/08/20   Roxan Hockey, MD  naltrexone (DEPADE) 50 MG tablet Take 1 tablet p.o. daily and on every 6th day take  2 tablets. 04/04/20   Lovena Le, Malena M, DO  ondansetron (ZOFRAN-ODT) 4 MG disintegrating tablet DISSOLVE 1 TABLET ON THE TONGUE EVERY 8 HOURS AS NEEDED FOR NAUSEA Patient taking differently: Take 4 mg by mouth every 8 (eight) hours as needed for nausea or vomiting.  01/20/20   Annitta Needs, NP  PARoxetine (PAXIL) 20 MG tablet Take 1 tablet (20 mg total) by mouth daily. 08/08/20   Roxan Hockey, MD  potassium chloride SA (KLOR-CON) 20 MEQ tablet Take 1 tablet (20 mEq total) by mouth daily as needed (Take when taking Lasix). 08/08/20   Roxan Hockey, MD  rifaximin (XIFAXAN) 550 MG TABS tablet Take 1 tablet (550 mg total) by mouth 2 (two) times daily. 08/08/20   Roxan Hockey, MD  rifaximin (XIFAXAN) 550 MG TABS tablet Take 1 tablet (550 mg total) by mouth 2 (two) times daily. 08/23/20   Carlis Stable, NP  sildenafil (VIAGRA) 100 MG tablet TAKE  1/2 TABLET BY MOUTH EVERY DAY Patient taking differently: Take 50 mg by mouth as needed for erectile dysfunction.  03/29/20   [name], MD  sitaGLIPtin (JANUVIA) 50 MG tablet Take 1 tablet (50 mg total) by mouth daily. 10/25/20   Lovena Le, Malena M, DO  sucralfate (CARAFATE) 1 g tablet TAKE 1 TABLET BY MOUTH BEFORE MEALS 10/25/20   Lovena Le, Malena M, DO  traZODone (DESYREL) 50 MG tablet TAKE 1 TABLET BY MOUTH AT BEDTIME AS NEEDED FOR INSOMNIA 08/18/20   Erven Colla, DO     Family History  Problem Relation Age of Onset  . Cancer Mother        ? etiology  . Heart disease Mother   . Colon polyps Sister 75       two sisters  . Brain cancer Father   . Liver disease Neg Hx     Social History   Socioeconomic History  . Marital status: Married    Spouse name: Not on file  . Number of children: 2  . Years of education: 33  . Highest education level: High school graduate  Occupational History  . 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 Smoking status: Current Every Day Smoker    Packs/day: 1.00    Years: 30.00    Pack years: 30.00    Types: Cigarettes    Start date: 12/24/1979  . Smokeless tobacco: Never Used  Vaping Use  . Vaping Use: Never used  Substance and Sexual Activity  . Alcohol use: Yes    Alcohol/week: 0.0 standard drinks    Comment: pt admits to drinking 2 fifths a day  . Drug use: No  . Sexual activity: Yes    Partners: Female    Birth control/protection: None  Other Topics Concern  . Not on file  Social History Narrative   Lives at home with family.   Right-handed.   No daily use of caffeine.   Social Determinants of Health   Financial Resource Strain:   . Difficulty of Paying Living Expenses: Not on file  Food Insecurity:   . Worried About Charity fundraiser in the Last Year: Not on file  . Ran Out of Food in the Last Year: Not on file  Transportation Needs:   . Lack of Transportation (Medical): Not on  file  . Lack of Transportation (Non-Medical): Not on file  Physical Activity:   . Days of Exercise per Week: Not on file  . Minutes of  Exercise per Session: Not on file  Stress:   . Feeling of Stress : Not on file  Social Connections:   . Frequency of Communication with Friends and Family: Not on file  . Frequency of Social Gatherings with Friends and Family: Not on file  . Attends Religious Services: Not on file  . Active Member of Clubs or Organizations: Not on file  . Attends Archivist Meetings: Not on file  . Marital Status: Not on file      Review of Systems  Review of Systems: A 12 point ROS discussed and pertinent positives are indicated in the HPI above.  All other systems are negative.  Physical Exam No direct physical exam was performed (except for noted visual exam findings with Video Visits).    Vital Signs: There were no vitals taken for this visit.  Imaging: US ABDOMINAL PELVIC ART/VENT FLOW DOPPLER  Addendum Date: 10/24/2020   ADDENDUM REPORT: 10/24/2020 15:08 ADDENDUM: Small volume ascites and signs of liver disease/cirrhosis as before. Electronically Signed   By: Zetta Bills M.D.   On: 10/24/2020 15:08   Result Date: 10/24/2020 CLINICAL DATA:  Tips, bleeding esophageal varices follow-up evaluation. EXAM: DUPLEX ULTRASOUND OF LIVER AND TIPS SHUNT TECHNIQUE: Color and duplex Doppler ultrasound was performed to evaluate the hepatic in-flow and out-flow vessels. COMPARISON:  Comparison study from August 05, 2020 FINDINGS: Portal Vein Velocities Main:  32 cm/sec Right:  123 cm/sec Left:  18 cm/sec TIPS Stent Velocities Proximal:  123 cm/sec Mid:  163 Distal:  117 cm/sec IVC: IVC is patent. Hepatic Vein Velocities Right:  117 cm/sec Mid:  14 cm/sec Left:  13 cm/sec Splenic Vein: Patent, 17 centimeters/second. Superior Mesenteric Vein: Not visualized Hepatic Artery: 197 cm per second Ascities: Small amount of ascites. Varices: Not seen Other findings: None  IMPRESSION: 1. Tips is patent with antegrade flow. Flow velocities are similar to the prior exam. 2. Portal vein is patent. 3. Hepatofugal flow in the LEFT portal vein. 4. Elevated velocities in the hepatic artery, not as elevated as on the previous ultrasound evaluation may be in part artifactual. The waveforms are normal. Electronically Signed: By: Zetta Bills M.D. On: 10/24/2020 14:56    Labs:  CBC: Recent Labs    08/06/20 0640 08/07/20 0710 08/08/20 0651 10/24/20 1346  WBC 3.7* 3.7* 3.0* 4.6  HGB 10.7* 10.8* 10.6* 12.5*  HCT 32.7* 32.5* 32.2* 36.4*  PLT 55* 57* 53* 84*    COAGS: Recent Labs    11/19/19 2208 11/22/19 2304 11/28/19 0344 08/05/20 0031 08/05/20 0543 08/08/20 0651  INR 1.3*   < > 1.3* 1.5* 1.7* 1.3*  APTT 41*  --   --  39*  --   --    < > = values in this interval not displayed.    BMP: Recent Labs    08/06/20 0640 08/07/20 0710 08/08/20 0651 10/24/20 1029  NA 141 140 141 137  K 4.0 3.5 3.3* 3.9  CL 106 105 107 100  CO2 _0 GLUCOSE 124* 100* 94 350*  BUN 10 7 <5* 11  CALCIUM 7.6* 7.6* 7.9* 8.6*  CREATININE 0.59* 0.54* 0.49* 0.86  GFRNONAA >60 >60 >60 100  GFRAA >60 >60 >60 116    LIVER FUNCTION TESTS: Recent Labs    08/06/20 0640 08/07/20 0710 08/08/20 0651 10/24/20 1029  BILITOT 4.4* 3.6* 2.9* 1.4*  AST 101* 89* 87* 41*  ALT _1 ALKPHOS 137* 134* 133* 113  PROT  5.1* 5.0* 5.1* 6.6  ALBUMIN 1.9* 1.9* 2.0* 3.1*    TUMOR MARKERS: No results for input(s): AFPTM, CEA, CA199, CHROMGRNA in the last 8760 hours.  Assessment and Plan:   Bradley Lader is a 54 yo male with ETOH-induced cirrhosis treated for acute GI bleeding secondary to portal HTN in December of 2020, including TIPS and embolization.  Several TIPS duplex, including 10/24/20 have shown TIPS patency.   In August, he was again admitted with GI bleeding, with endoscopy showing no varices, and the most likely etiology from portal gastropathy.  He admits to  ongoing recurrent ETOH usage, including before his August admission, as well as recently as his birthday [birth year].  I had a discussion with him regarding the need to abstain from ETOH use, which he understands.    At this point, I do not see utility with TIPS shunt study or CT imaging, given the duplex findings.  He tells me he should have a follow up visit with GI in the near future, but does not know when that is.    Our plan will be to use duplex surveillance of the TIPS, unless he has recurrent symptoms or evidence of recurrent varices on upper endo, at which point more invasive study/CT imaging may be useful.   Plan: - We will schedule a repeat TIPS duplex in 4 months - Continue efforts to abstain from ETOH     Electronically Signed: Corrie Mckusick 10/27/2020, 1:29 PM   I spent a total of    15 Minutes in remote  clinical consultation, greater than 50% of which was counseling/coordinating care for ETOH cirrhosis, portal HTN, SP BRTO and TIPS.    Visit type: Audio only (telephone). Audio (no video) only due to patient's lack of internet/smartphone capability. Alternative for in-person consultation at [facility], [place] [address] [place], [place], [place]. This visit type was conducted due to national recommendations for restrictions regarding the COVID-19 Pandemic (e.g. social distancing).  This format is felt to be most appropriate for this patient at this time.  All issues noted in this document were discussed and addressed.

## 2020-11-06 NOTE — Telephone Encounter (Signed)
Inc [place] from 25mg  to 50mg . Dr. Lovena Le

## 2020-11-10 ENCOUNTER — Ambulatory Visit (INDEPENDENT_AMBULATORY_CARE_PROVIDER_SITE_OTHER): Payer: Managed Care, Other (non HMO) | Admitting: Nurse Practitioner

## 2020-11-10 ENCOUNTER — Encounter: Payer: Self-pay | Admitting: Internal Medicine

## 2020-11-10 ENCOUNTER — Encounter: Payer: Self-pay | Admitting: Nurse Practitioner

## 2020-11-10 ENCOUNTER — Other Ambulatory Visit: Payer: Self-pay

## 2020-11-10 DIAGNOSIS — K7200 Acute and subacute hepatic failure without coma: Secondary | ICD-10-CM

## 2020-11-10 DIAGNOSIS — K7031 Alcoholic cirrhosis of liver with ascites: Secondary | ICD-10-CM

## 2020-11-10 DIAGNOSIS — K766 Portal hypertension: Secondary | ICD-10-CM | POA: Diagnosis not present

## 2020-11-10 DIAGNOSIS — K7682 Hepatic encephalopathy: Secondary | ICD-10-CM

## 2020-11-10 MED ORDER — RIFAXIMIN 550 MG PO TABS: 550.0000 mg | ORAL_TABLET | Freq: Two times a day (BID) | ORAL | 11 refills | Status: DC

## 2020-11-10 MED ORDER — LACTULOSE ENCEPHALOPATHY 10 GM/15ML PO SOLN: ORAL | 11 refills | Status: DC

## 2020-11-10 NOTE — Progress Notes (Signed)
Referring Provider: Erven Colla, DO Primary Care Physician:  Erven Colla, DO Primary GI:  Dr. Gala Romney  Chief Complaint  Patient presents with  . Follow-up    HPI:   Bradley Miles is a 54 y.o. male who presents for follow-up on alcohol dependence, alcoholic cirrhosis, acute blood loss anemia.  The patient has been seen by our office since 12/28/2019 for the same.  Noted history of alcoholic cirrhosis, hepatic encephalopathy with alcohol dependence.  Has had ongoing progressive worsening of mental status.  Lactulose seem to help.  Has intermittently tried to be alcohol free but subsequently typically relapses.  He has been counseled appropriately for complications related to cirrhosis including low-salt diet, protein intake, alcohol avoidance, lactulose, Xifaxan, etc.  He is intermittently a no-show for his follow-ups.    He has had hospitalizations related to complications from cirrhosis.  At the end of 2020 he was admitted and admitted drinking fifth of liquor daily.  During this hospitalization he developed a large volume hematemesis for which required transfer to [facility] and ICU stay for CTA and IR consult.  He underwent a TIPS procedure with embolization of the gastric variceal supply.  During his hospitalization he received a total of 11 units of blood, 3 units of FFP, and 1 dose of vitamin K.  Now status post BRTO on 11/21/2020 and tips with embolization of 11/23/2020.  The patient was again omitted to the hospital from 08/04/2020 03/08/2020 for hepatic encephalopathy, and acute GI bleed.  Noted hematemesis and melena CTA with no active bleed and hemoglobin downward trend from 12.6 on admission to 10.6 at discharge.  We will IV octreotide and discharged home on oral Dexilant.  INR elevated 1.7 treated with vitamin K which had a resulting decline to 1.3.  EGD completed without active bleeding noted.  Also noted hepatic encephalopathy with ammonia elevated at 121 and likely due to  noncompliance with lactulose and rifaximin.  Also noncompliant with nadolol incidentally.  He was treated with IV Rocephin for SBP prophylaxis and discharged on Omnicef.  During his hospitalization he admitted drinking heavily up to two fifths of liquor a day and he was subsequently placed on CIWA protocol during his hospitalization.  Advanced to clear liquids to solids and discharged home satisfactory condition at which point he was advised to be compliant with medications and seek alcohol treatment.  Today states doing okay overall. His wife is on the phone with him. Still drinking 4-5 servings (5 oz) of wine a day. States it's not every day. He has "skip days". He went to detox in September, which was an inpatient center in [place] and was there 7 weeks. Has intermittent abdominal pain right sided. One episode of N/V two days ago, none since. No hematemesis. Thinks the N/V was from eating and taking lactulose at the same time; was also drinking at the same time. Denies hematochezia, melena, fever, chills. Weight is stable. He is having worsening confusion/sluggishness. Had a recent fall, no injuries. States he is taking lactulose but only having one bowel movement a day. Still taking Nadolol. Denies any yellowing of the skin/eyes, darkened urine, generalized tremors, generalized pruritis. Denies URI or flu-like symptoms. Denies loss of sense of taste or smell. The patient has received COVID-19 vaccination(s). Denies chest pain, dyspnea, dizziness, lightheadedness, syncope, near syncope. Denies any other upper or lower GI symptoms.  EGD: up to date Lactulose: needs refill Xifaxan: needs refill Beta Blocker: Taking Labs: Needs update Imaging: Needs update  Counseling (low sodium, ETOH, lactulose dosing): given  Past Medical History:  Diagnosis Date  . Anxiety   . Controlled type 2 diabetes mellitus with complication, without long-term current use of insulin ([place]) 06/26/2020  . Enlarged liver   . GERD  (gastroesophageal reflux disease)   . Hypertension   . Palpitations   . PVC's (premature ventricular contractions)   . Shortness of breath   . Tussive syncope 12/22/2019  . Varicose veins     Past Surgical History:  Procedure Laterality Date  . BIOPSY  02/02/2019   Procedure: BIOPSY;  Surgeon: Daneil Dolin, MD;  [facility];  Service: Endoscopy;;  gastric  . COLONOSCOPY WITH ESOPHAGOGASTRODUODENOSCOPY (EGD)  12/16/2012   internal hemorrhoids, colonic diverticulosis, benign polyps, screening in 2024. EGD with mild erosive reflux esophagitis, small hiatal hernia, negative H.pylori  . cyst reomved     from spine  . ESOPHAGOGASTRODUODENOSCOPY  05/19/09   RMR: Geographic distal esophageal erosions consistent with severe erosive reflux esophagitis. schatzki's ring s/P dilation/small hiatal hernia otherwise normal stomach  . ESOPHAGOGASTRODUODENOSCOPY (EGD) WITH PROPOFOL N/A 02/02/2019   Dr. Gala Romney: retained gastric contents. portal HTN gastropathy  . ESOPHAGOGASTRODUODENOSCOPY (EGD) WITH PROPOFOL N/A 11/20/2019   Procedure: ESOPHAGOGASTRODUODENOSCOPY (EGD) WITH PROPOFOL;  Surgeon: Daneil Dolin, MD;  [facility];  Service: Endoscopy;  Laterality: N/A;  . ESOPHAGOGASTRODUODENOSCOPY (EGD) WITH PROPOFOL N/A 11/24/2019   Procedure: ESOPHAGOGASTRODUODENOSCOPY (EGD) WITH PROPOFOL;  Surgeon: Ronnette Juniper, MD;  Location: [facility];  Service: Gastroenterology;  Laterality: N/A;  . ESOPHAGOGASTRODUODENOSCOPY (EGD) WITH PROPOFOL N/A 08/08/2020   Procedure: ESOPHAGOGASTRODUODENOSCOPY (EGD) WITH PROPOFOL;  Surgeon: Daneil Dolin, MD;  [facility];  Service: Endoscopy;  Laterality: N/A;  . IR ANGIOGRAM SELECTIVE EACH ADDITIONAL VESSEL  11/22/2019  . IR ANGIOGRAM SELECTIVE EACH ADDITIONAL VESSEL  11/24/2019  . IR ANGIOGRAM SELECTIVE EACH ADDITIONAL VESSEL  11/24/2019  . IR ANGIOGRAM SELECTIVE EACH ADDITIONAL VESSEL  11/24/2019  . IR EMBO ART  VEN HEMORR LYMPH EXTRAV   INC GUIDE ROADMAPPING  11/22/2019  . IR EMBO ART  VEN HEMORR LYMPH EXTRAV  INC GUIDE ROADMAPPING  11/24/2019  . IR RADIOLOGIST EVAL & MGMT  12/29/2019  . IR RADIOLOGIST EVAL & MGMT  10/27/2020  . IR TIPS  11/24/2019  . IR US GUIDE VASC ACCESS RIGHT  11/22/2019  . IR VENOGRAM RENAL UNI LEFT  11/22/2019  . RADIOLOGY WITH ANESTHESIA N/A 11/24/2019   Procedure: IR WITH ANESTHESIA;  Surgeon: Radiologist, Medication, MD;  Location: [facility];  Service: Radiology;  Laterality: N/A;  . TIPS PROCEDURE N/A 11/22/2019   Procedure: BRTO/TRANS-JUGULAR INTRAHEPATIC PORTAL SHUNT (TIPS);  Surgeon: Corrie Mckusick, DO;  Location: [facility];  Service: Anesthesiology;  Laterality: N/A;  . WISDOM TOOTH EXTRACTION      Current Outpatient Medications  Medication Sig Dispense Refill  . allopurinol (ZYLOPRIM) 100 MG tablet TAKE 1 TABLET BY MOUTH EVERY DAY (Patient taking differently: Take 100 mg by mouth daily. ) 30 tablet 5  . b complex vitamins tablet Take 1 tablet by mouth daily.    . blood glucose meter kit and supplies Dispense based on patient and insurance preference. Test blood sugar once daily.  (FOR ICD-10 E10.9, E11.9). 1 each 5  . Cholecalciferol (D3-1000) 25 MCG (1000 UT) capsule Take 1,000 Units by mouth daily.    Marland Kitchen dexlansoprazole (DEXILANT) 60 MG capsule Take 1 capsule (60 mg total) by mouth daily. 30 capsule 3  . folic acid (FOLVITE) 607 MCG tablet Take 400 mcg  by mouth daily.    . furosemide (LASIX) 40 MG tablet Take 0.5 tablets (20 mg total) by mouth daily. Take Daily for 5 Days, Then Take Daily Only As Needed for Edema 30 tablet 11  . lactulose, encephalopathy, (CHRONULAC) 10 GM/15ML SOLN Take 15-30cc 1-2 daily to Titrate for 3-4 soft bowel movements a day 1892 mL 11  . LORazepam (ATIVAN) 1 MG tablet Take 1 mg by mouth as needed for anxiety.    . Multiple Vitamin (MULTIVITAMIN WITH MINERALS) TABS tablet Take 1 tablet by mouth daily.    . nadolol (CORGARD) 40 MG tablet Take 1.5 tablets (60 mg total) by  mouth daily. 135 tablet 3  . naltrexone (DEPADE) 50 MG tablet Take 1 tablet p.o. daily and on every 6th day take 2 tablets. 35 tablet 3  . ondansetron (ZOFRAN-ODT) 4 MG disintegrating tablet DISSOLVE 1 TABLET ON THE TONGUE EVERY 8 HOURS AS NEEDED FOR NAUSEA (Patient taking differently: Take 4 mg by mouth every 8 (eight) hours as needed for nausea or vomiting. ) 24 tablet 2  . PARoxetine (PAXIL) 20 MG tablet Take 1 tablet (20 mg total) by mouth daily. 30 tablet 3  . potassium chloride SA (KLOR-CON) 20 MEQ tablet Take 1 tablet (20 mEq total) by mouth daily as needed (Take when taking Lasix). 30 tablet 11  . sildenafil (VIAGRA) 100 MG tablet TAKE 1/2 TABLET BY MOUTH EVERY DAY (Patient taking differently: Take 50 mg by mouth as needed for erectile dysfunction. ) 3 tablet 3  . sitaGLIPtin (JANUVIA) 50 MG tablet Take 1 tablet (50 mg total) by mouth daily. 90 tablet 1  . sucralfate (CARAFATE) 1 g tablet TAKE 1 TABLET BY MOUTH BEFORE MEALS 90 tablet 1  . traZODone (DESYREL) 50 MG tablet TAKE 1 TABLET BY MOUTH AT BEDTIME AS NEEDED FOR INSOMNIA 30 tablet 2  . venlafaxine (EFFEXOR) 25 MG tablet Take 25 mg by mouth 2 (two) times daily. Pt isn't aware of the dosage    . ferrous sulfate 325 (65 FE) MG EC tablet Take 1 tablet (325 mg total) by mouth 2 (two) times daily. (Patient not taking: Reported on 08/05/2020) 60 tablet 3  . ferrous sulfate 325 (65 FE) MG EC tablet Take 1 tablet (325 mg total) by mouth 2 (two) times daily. 60 tablet 3  . rifaximin (XIFAXAN) 550 MG TABS tablet Take 1 tablet (550 mg total) by mouth 2 (two) times daily. 60 tablet 11   No current facility-administered medications for this visit.    Allergies as of 11/10/2020  . (No Known Allergies)    Family History  Problem Relation Age of Onset  . Cancer Mother        ? etiology  . Heart disease Mother   . Colon polyps Sister 72       two sisters  . Brain cancer Father   . Liver disease Neg Hx     Social History   Socioeconomic  History  . Marital status: Married    Spouse name: Not on file  . Number of children: 2  . Years of education: 9  . Highest education level: High school graduate  Occupational History  . 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 Smoking status: Current Every Day Smoker    Packs/day: 1.00    Years: 30.00    Pack years: 30.00    Types: Cigarettes    Start date: 12/24/1979  . Smokeless tobacco: Never Used  Vaping Use  . Vaping Use: Never used  Substance and Sexual Activity  . Alcohol use: Yes    Alcohol/week: 0.0 standard drinks    Comment: on 11/10/20: still drinking "4-5 glasses of wine a day"  . Drug use: No  . Sexual activity: Yes    Partners: Female    Birth control/protection: None  Other Topics Concern  . Not on file  Social History Narrative   Lives at home with family.   Right-handed.   No daily use of caffeine.   Social Determinants of Health   Financial Resource Strain:   . Difficulty of Paying Living Expenses: Not on file  Food Insecurity:   . Worried About Charity fundraiser in the Last Year: Not on file  . Ran Out of Food in the Last Year: Not on file  Transportation Needs:   . Lack of Transportation (Medical): Not on file  . Lack of Transportation (Non-Medical): Not on file  Physical Activity:   . Days of Exercise per Week: Not on file  . Minutes of Exercise per Session: Not on file  Stress:   . Feeling of Stress : Not on file  Social Connections:   . Frequency of Communication with Friends and Family: Not on file  . Frequency of Social Gatherings with Friends and Family: Not on file  . Attends Religious Services: Not on file  . Active Member of Clubs or Organizations: Not on file  . Attends Archivist Meetings: Not on file  . Marital Status: Not on file    Subjective: Review of Systems  Constitutional: Negative for chills, fever, malaise/fatigue and weight loss.  HENT: Negative for congestion and  sore throat.   Respiratory: Negative for cough and shortness of breath.   Cardiovascular: Negative for chest pain and palpitations.  Gastrointestinal: Positive for abdominal pain. Negative for blood in stool, constipation, diarrhea, heartburn, melena, nausea and vomiting.       Admits some tremors if he's holding his phone  Musculoskeletal: Negative for joint pain and myalgias.  Skin: Negative for rash.  Neurological: Negative for dizziness and weakness.       Intermittent sluggishness/confusion  Endo/Heme/Allergies: Does not bruise/bleed easily.  Psychiatric/Behavioral: Positive for substance abuse. Negative for depression. The patient is not nervous/anxious.   All other systems reviewed and are negative.    Objective: BP 114/66   Pulse 63   Temp (!) 97.2 F (36.2 C) (Oral)   Ht 6' 3"  (1.905 m)   Wt 263 lb 4.8 oz (119.4 kg)   BMI 32.91 kg/m  Physical Exam Vitals and nursing note reviewed.  Constitutional:      General: He is not in acute distress.    Appearance: Normal appearance. He is obese. He is not ill-appearing, toxic-appearing or diaphoretic.  HENT:     Head: Normocephalic and atraumatic.     Nose: No congestion or rhinorrhea.  Eyes:     General: No scleral icterus. Cardiovascular:     Rate and Rhythm: Normal rate and regular rhythm.     Heart sounds: Normal heart sounds.  Pulmonary:     Effort: Pulmonary effort is normal.     Breath sounds: Normal breath sounds.  Abdominal:     General: Bowel sounds are normal. There is no distension.     Palpations: Abdomen is soft. There is hepatomegaly. There is no shifting dullness, fluid wave, splenomegaly or mass.     Tenderness: There is no abdominal tenderness. There is no guarding  or rebound.     Hernia: No hernia is present.    Musculoskeletal:     Cervical back: Neck supple.  Skin:    General: Skin is warm and dry.     Coloration: Skin is not jaundiced.     Findings: No bruising or rash.  Neurological:      General: No focal deficit present.     Mental Status: He is alert and oriented to person, place, and time. Mental status is at baseline.     Comments: Negative asterixis  Psychiatric:        Mood and Affect: Mood normal.        Behavior: Behavior normal.        Thought Content: Thought content normal.      Assessment:  Pleasant 54 year old male who is well-known to our practice for chronic alcoholic cirrhosis, continued alcohol abuse, portal pretension, significant GI bleed requiring ICU hospitalization in [place] and 14 units of blood products status post BRTO, TIPS with embolization. Repeated admissions for hepatic encephalopathy. He recently completed inpatient detox at a facility in [place] in September through mid October. He is still drinking about 4 to 5 glasses of wine a day. Overall very poor long-term prognosis if he continues to drink. We have discussion again today and he verbalized understanding.  Alcoholic cirrhosis: He was again counseled on low-salt diet, alcohol abstinence, lactulose dosing (she is only taking about 1-2 doses a day with 1 bowel movement a day). He is due for updated liver labs and right upper quadrant ultrasound. He did recently have a TIPS evaluation ultrasound which showed a patent TIPS track. We need to complete a whole liver ultrasound for [place] screening. He is requesting refills of some of his medications today.  Recent and recurrent GI bleeding as sequela of cirrhosis: No recent bleeding. He did have an episode of nausea and vomiting that he states is because he ate and took lactulose at the same time. However, his wife made a comment that he was also drinking at that time which she admitted. Denied hematemesis. No recent hematochezia or melena. Recommend continue to monitor notify us of any further bleeding.  Plan: 1. Alcohol abstinence 2. Take lactulose titrated for 3-4 soft bowel movements a day 3. Refill lactulose 4. Refill Xifaxan 5. Contact  primary care to refill antidepressants 6. Cirrhosis labs including CBC, CMP, INR, AFP 7. Right upper quadrant ultrasound 8. Follow-up in 6 months    Thank you for allowing [place] to participate in the care of Donnelly Angelica, DNP, AGNP-C Adult & Gerontological Nurse Practitioner [facility]   11/10/2020 11:34 AM   Disclaimer: This note was dictated with voice recognition software. Similar sounding words can inadvertently be transcribed and may not be corrected upon review.

## 2020-11-10 NOTE — Progress Notes (Signed)
Cc'ed to pcp °

## 2020-11-10 NOTE — Patient Instructions (Addendum)
Your health issues we discussed today were:   Alcoholic cirrhosis and ramifications of cirrhosis: 1. As we discussed, continue to eat a low-salt diet. Your goal is to eat less than 2000 mg of sodium daily 2. You will need to adjust the amount of lactulose you take every day. Some days she'll take more and Sunday she'll take last. Your goal is 3-4 soft bowel movements a day to prevent confusion 3. I have refilled your lactulose and your Xifaxan 4. Have your labs completed as soon as you can 5. Somebody will call to schedule your ultrasound of your liver 6. It is imperative that she stop drinking. If you continue to drink you have a very poor outcome and will likely die sooner rather than later  Previous GI bleeding: 1. Notify us immediately and proceed directly to the emergency room if you see any blood in your vomiting, black stools, or red blood in your stools  Overall I recommend:  1. Continue your other current medications 2. Return for follow-up in 6 months 3. Call us for any questions or concerns   ---------------------------------------------------------------  I am glad you have gotten your COVID-19 vaccination!  Even though you are fully vaccinated you should continue to follow CDC and state/local guidelines.  ---------------------------------------------------------------   At [facility] we value your feedback. You may receive a survey about your visit today. Please share your experience as we strive to create trusting relationships with our patients to provide genuine, compassionate, quality care.  We appreciate your understanding and patience as we review any laboratory studies, imaging, and other diagnostic tests that are ordered as we care for you. Our office policy is 5 business days for review of these results, and any emergent or urgent results are addressed in a timely manner for your best interest. If you do not hear from our office in 1 week, please  contact us.   We also encourage the use of MyChart, which contains your medical information for your review as well. If you are not enrolled in this feature, an access code is on this after visit summary for your convenience. Thank you for allowing [place] to be involved in your care.  It was great to see you today!  I hope you have a Merry Christmas and Happy New Year!!

## 2020-11-15 ENCOUNTER — Encounter (HOSPITAL_COMMUNITY): Payer: Managed Care, Other (non HMO)

## 2020-11-15 ENCOUNTER — Inpatient Hospital Stay (HOSPITAL_COMMUNITY): Payer: Managed Care, Other (non HMO) | Admitting: Certified Registered"

## 2020-11-15 ENCOUNTER — Inpatient Hospital Stay: Payer: Self-pay

## 2020-11-15 ENCOUNTER — Encounter (HOSPITAL_COMMUNITY): Payer: Self-pay | Admitting: Emergency Medicine

## 2020-11-15 ENCOUNTER — Inpatient Hospital Stay (HOSPITAL_COMMUNITY): Payer: Managed Care, Other (non HMO)

## 2020-11-15 ENCOUNTER — Other Ambulatory Visit: Payer: Self-pay

## 2020-11-15 ENCOUNTER — Emergency Department (HOSPITAL_COMMUNITY): Payer: Managed Care, Other (non HMO)

## 2020-11-15 ENCOUNTER — Inpatient Hospital Stay (HOSPITAL_COMMUNITY): Payer: Managed Care, Other (non HMO) | Admitting: Certified Registered Nurse Anesthetist

## 2020-11-15 ENCOUNTER — Inpatient Hospital Stay (HOSPITAL_COMMUNITY)
Admission: EM | Admit: 2020-11-15 | Discharge: 2020-11-20 | DRG: 270 | Disposition: A | Discharge status: Home / Self Care | Payer: Managed Care, Other (non HMO) | Attending: Student | Admitting: Student

## 2020-11-15 ENCOUNTER — Encounter (HOSPITAL_COMMUNITY)
Admission: EM | Disposition: A | Discharge status: Home / Self Care | Payer: Self-pay | Source: Home / Self Care | Attending: Internal Medicine

## 2020-11-15 DIAGNOSIS — F17210 Nicotine dependence, cigarettes, uncomplicated: Secondary | ICD-10-CM | POA: Diagnosis present

## 2020-11-15 DIAGNOSIS — G4700 Insomnia, unspecified: Secondary | ICD-10-CM | POA: Diagnosis present

## 2020-11-15 DIAGNOSIS — E118 Type 2 diabetes mellitus with unspecified complications: Secondary | ICD-10-CM | POA: Diagnosis not present

## 2020-11-15 DIAGNOSIS — K921 Melena: Secondary | ICD-10-CM | POA: Diagnosis present

## 2020-11-15 DIAGNOSIS — F10230 Alcohol dependence with withdrawal, uncomplicated: Secondary | ICD-10-CM | POA: Diagnosis not present

## 2020-11-15 DIAGNOSIS — J9601 Acute respiratory failure with hypoxia: Secondary | ICD-10-CM | POA: Diagnosis present

## 2020-11-15 DIAGNOSIS — E876 Hypokalemia: Secondary | ICD-10-CM | POA: Diagnosis not present

## 2020-11-15 DIAGNOSIS — K7200 Acute and subacute hepatic failure without coma: Secondary | ICD-10-CM | POA: Diagnosis not present

## 2020-11-15 DIAGNOSIS — I313 Pericardial effusion (noninflammatory): Secondary | ICD-10-CM

## 2020-11-15 DIAGNOSIS — D62 Acute posthemorrhagic anemia: Secondary | ICD-10-CM | POA: Diagnosis present

## 2020-11-15 DIAGNOSIS — I8510 Secondary esophageal varices without bleeding: Secondary | ICD-10-CM | POA: Diagnosis not present

## 2020-11-15 DIAGNOSIS — E875 Hyperkalemia: Secondary | ICD-10-CM | POA: Diagnosis present

## 2020-11-15 DIAGNOSIS — R609 Edema, unspecified: Secondary | ICD-10-CM | POA: Diagnosis not present

## 2020-11-15 DIAGNOSIS — I82401 Acute embolism and thrombosis of unspecified deep veins of right lower extremity: Secondary | ICD-10-CM | POA: Diagnosis present

## 2020-11-15 DIAGNOSIS — K7040 Alcoholic hepatic failure without coma: Secondary | ICD-10-CM | POA: Diagnosis present

## 2020-11-15 DIAGNOSIS — E1165 Type 2 diabetes mellitus with hyperglycemia: Secondary | ICD-10-CM | POA: Diagnosis present

## 2020-11-15 DIAGNOSIS — Z8371 Family history of colonic polyps: Secondary | ICD-10-CM

## 2020-11-15 DIAGNOSIS — I10 Essential (primary) hypertension: Secondary | ICD-10-CM | POA: Diagnosis present

## 2020-11-15 DIAGNOSIS — K5790 Diverticulosis of intestine, part unspecified, without perforation or abscess without bleeding: Secondary | ICD-10-CM | POA: Diagnosis present

## 2020-11-15 DIAGNOSIS — F419 Anxiety disorder, unspecified: Secondary | ICD-10-CM | POA: Diagnosis present

## 2020-11-15 DIAGNOSIS — K7031 Alcoholic cirrhosis of liver with ascites: Secondary | ICD-10-CM | POA: Diagnosis present

## 2020-11-15 DIAGNOSIS — K7030 Alcoholic cirrhosis of liver without ascites: Secondary | ICD-10-CM | POA: Diagnosis not present

## 2020-11-15 DIAGNOSIS — F10231 Alcohol dependence with withdrawal delirium: Secondary | ICD-10-CM | POA: Diagnosis not present

## 2020-11-15 DIAGNOSIS — J9690 Respiratory failure, unspecified, unspecified whether with hypoxia or hypercapnia: Secondary | ICD-10-CM

## 2020-11-15 DIAGNOSIS — K922 Gastrointestinal hemorrhage, unspecified: Secondary | ICD-10-CM

## 2020-11-15 DIAGNOSIS — K920 Hematemesis: Secondary | ICD-10-CM | POA: Diagnosis present

## 2020-11-15 DIAGNOSIS — Z20822 Contact with and (suspected) exposure to covid-19: Secondary | ICD-10-CM | POA: Diagnosis present

## 2020-11-15 DIAGNOSIS — J690 Pneumonitis due to inhalation of food and vomit: Secondary | ICD-10-CM | POA: Diagnosis not present

## 2020-11-15 DIAGNOSIS — F1029 Alcohol dependence with unspecified alcohol-induced disorder: Secondary | ICD-10-CM | POA: Diagnosis not present

## 2020-11-15 DIAGNOSIS — F329 Major depressive disorder, single episode, unspecified: Secondary | ICD-10-CM | POA: Diagnosis present

## 2020-11-15 DIAGNOSIS — Z978 Presence of other specified devices: Secondary | ICD-10-CM

## 2020-11-15 DIAGNOSIS — J9600 Acute respiratory failure, unspecified whether with hypoxia or hypercapnia: Secondary | ICD-10-CM | POA: Diagnosis not present

## 2020-11-15 DIAGNOSIS — Z86718 Personal history of other venous thrombosis and embolism: Secondary | ICD-10-CM

## 2020-11-15 DIAGNOSIS — I864 Gastric varices: Secondary | ICD-10-CM | POA: Diagnosis present

## 2020-11-15 DIAGNOSIS — K766 Portal hypertension: Secondary | ICD-10-CM | POA: Diagnosis present

## 2020-11-15 DIAGNOSIS — Z7984 Long term (current) use of oral hypoglycemic drugs: Secondary | ICD-10-CM

## 2020-11-15 DIAGNOSIS — IMO0002 Reserved for concepts with insufficient information to code with codable children: Secondary | ICD-10-CM | POA: Diagnosis present

## 2020-11-15 DIAGNOSIS — R58 Hemorrhage, not elsewhere classified: Secondary | ICD-10-CM

## 2020-11-15 DIAGNOSIS — E119 Type 2 diabetes mellitus without complications: Secondary | ICD-10-CM | POA: Diagnosis present

## 2020-11-15 DIAGNOSIS — K649 Unspecified hemorrhoids: Secondary | ICD-10-CM | POA: Diagnosis present

## 2020-11-15 DIAGNOSIS — Z79899 Other long term (current) drug therapy: Secondary | ICD-10-CM

## 2020-11-15 DIAGNOSIS — R578 Other shock: Secondary | ICD-10-CM | POA: Diagnosis present

## 2020-11-15 DIAGNOSIS — F1020 Alcohol dependence, uncomplicated: Secondary | ICD-10-CM | POA: Diagnosis present

## 2020-11-15 DIAGNOSIS — K7011 Alcoholic hepatitis with ascites: Secondary | ICD-10-CM | POA: Diagnosis present

## 2020-11-15 DIAGNOSIS — F1010 Alcohol abuse, uncomplicated: Secondary | ICD-10-CM | POA: Diagnosis not present

## 2020-11-15 DIAGNOSIS — Z6835 Body mass index (BMI) 35.0-35.9, adult: Secondary | ICD-10-CM | POA: Diagnosis not present

## 2020-11-15 DIAGNOSIS — Z4659 Encounter for fitting and adjustment of other gastrointestinal appliance and device: Secondary | ICD-10-CM

## 2020-11-15 DIAGNOSIS — Z808 Family history of malignant neoplasm of other organs or systems: Secondary | ICD-10-CM

## 2020-11-15 DIAGNOSIS — Z8249 Family history of ischemic heart disease and other diseases of the circulatory system: Secondary | ICD-10-CM

## 2020-11-15 HISTORY — PX: IR EMBO ART  VEN HEMORR LYMPH EXTRAV  INC GUIDE ROADMAPPING: IMG5450

## 2020-11-15 HISTORY — PX: IR VENOGRAM RENAL UNI LEFT: IMG680

## 2020-11-15 HISTORY — PX: IR US GUIDE VASC ACCESS RIGHT: IMG2390

## 2020-11-15 HISTORY — PX: RADIOLOGY WITH ANESTHESIA: SHX6223

## 2020-11-15 HISTORY — PX: IR ANGIOGRAM SELECTIVE EACH ADDITIONAL VESSEL: IMG667

## 2020-11-15 HISTORY — PX: ESOPHAGOGASTRODUODENOSCOPY (EGD) WITH PROPOFOL: SHX5813

## 2020-11-15 HISTORY — PX: IR FLUORO GUIDE CV LINE RIGHT: IMG2283

## 2020-11-15 LAB — BASIC METABOLIC PANEL
Anion gap: 6 (ref 5–15)
BUN: 15 mg/dL (ref 6–20)
CO2: 26 mmol/L (ref 22–32)
Calcium: 6.9 mg/dL — ABNORMAL LOW (ref 8.9–10.3)
Chloride: 110 mmol/L (ref 98–111)
Creatinine, Ser: 0.77 mg/dL (ref 0.61–1.24)
GFR, Estimated: >60 mL/min (ref >60)
Glucose, Bld: 235 mg/dL — ABNORMAL HIGH (ref 70–99)
Potassium: 4.9 mmol/L (ref 3.5–5.1)
Sodium: 142 mmol/L (ref 135–145)

## 2020-11-15 LAB — POCT I-STAT 7, (LYTES, BLD GAS, ICA,H+H)
Acid-base deficit: 2 mmol/L (ref 0.0–2.0)
Acid-base deficit: 1 mmol/L (ref 0.0–2.0)
Bicarbonate: 27.1 mmol/L (ref 20.0–28.0)
Bicarbonate: 25.5 mmol/L (ref 20.0–28.0)
Calcium, Ion: 1.03 mmol/L — ABNORMAL LOW (ref 1.15–1.40)
Calcium, Ion: 1.01 mmol/L — ABNORMAL LOW (ref 1.15–1.40)
HCT: 26 % — ABNORMAL LOW (ref 39.0–52.0)
HCT: 22 % — ABNORMAL LOW (ref 39.0–52.0)
Hemoglobin: 8.8 g/dL — ABNORMAL LOW (ref 13.0–17.0)
Hemoglobin: 7.5 g/dL — ABNORMAL LOW (ref 13.0–17.0)
O2 Saturation: 99 %
O2 Saturation: 95 %
Potassium: 5.7 mmol/L — ABNORMAL HIGH (ref 3.5–5.1)
Potassium: 6.4 mmol/L (ref 3.5–5.1)
Sodium: 144 mmol/L (ref 135–145)
Sodium: 142 mmol/L (ref 135–145)
TCO2: 29 mmol/L (ref 22–32)
TCO2: 27 mmol/L (ref 22–32)
pCO2 arterial: 69.1 mmHg (ref 32.0–48.0)
pCO2 arterial: 51.4 mmHg — ABNORMAL HIGH (ref 32.0–48.0)
pH, Arterial: 7.202 — ABNORMAL LOW (ref 7.350–7.450)
pH, Arterial: 7.304 — ABNORMAL LOW (ref 7.350–7.450)
pO2, Arterial: 201 mmHg — ABNORMAL HIGH (ref 83.0–108.0)
pO2, Arterial: 87 mmHg (ref 83.0–108.0)

## 2020-11-15 LAB — POCT I-STAT, CHEM 8
BUN: 16 mg/dL (ref 6–20)
Calcium, Ion: 1.05 mmol/L — ABNORMAL LOW (ref 1.15–1.40)
Chloride: 107 mmol/L (ref 98–111)
Creatinine, Ser: 0.9 mg/dL (ref 0.61–1.24)
Glucose, Bld: 190 mg/dL — ABNORMAL HIGH (ref 70–99)
HCT: 29 % — ABNORMAL LOW (ref 39.0–52.0)
Hemoglobin: 9.9 g/dL — ABNORMAL LOW (ref 13.0–17.0)
Potassium: 5.7 mmol/L — ABNORMAL HIGH (ref 3.5–5.1)
Sodium: 143 mmol/L (ref 135–145)
TCO2: 25 mmol/L (ref 22–32)

## 2020-11-15 LAB — COMPREHENSIVE METABOLIC PANEL
ALT: 18 U/L (ref 0–44)
AST: 38 U/L (ref 15–41)
Albumin: 2.5 g/dL — ABNORMAL LOW (ref 3.5–5.0)
Alkaline Phosphatase: 111 U/L (ref 38–126)
Anion gap: 9 (ref 5–15)
BUN: 12 mg/dL (ref 6–20)
CO2: 27 mmol/L (ref 22–32)
Calcium: 8.3 mg/dL — ABNORMAL LOW (ref 8.9–10.3)
Chloride: 104 mmol/L (ref 98–111)
Creatinine, Ser: 0.9 mg/dL (ref 0.61–1.24)
GFR, Estimated: >60 mL/min (ref >60)
Glucose, Bld: 258 mg/dL — ABNORMAL HIGH (ref 70–99)
Potassium: 4.5 mmol/L (ref 3.5–5.1)
Sodium: 140 mmol/L (ref 135–145)
Total Bilirubin: 2.6 mg/dL — ABNORMAL HIGH (ref 0.3–1.2)
Total Protein: 6.2 g/dL — ABNORMAL LOW (ref 6.5–8.1)

## 2020-11-15 LAB — PREPARE RBC (CROSSMATCH)

## 2020-11-15 LAB — CBC
HCT: 39.1 % (ref 39.0–52.0)
Hemoglobin: 13.2 g/dL (ref 13.0–17.0)
MCH: 36.8 pg — ABNORMAL HIGH (ref 26.0–34.0)
MCHC: 33.8 g/dL (ref 30.0–36.0)
MCV: 108.9 fL — ABNORMAL HIGH (ref 80.0–100.0)
Platelets: UNDETERMINED 10*3/uL (ref 150–400)
RBC: 3.59 MIL/uL — ABNORMAL LOW (ref 4.22–5.81)
RDW: 16.2 % — ABNORMAL HIGH (ref 11.5–15.5)
WBC: 7.5 10*3/uL (ref 4.0–10.5)
nRBC: 0 % (ref 0.0–0.2)

## 2020-11-15 LAB — HEMOGLOBIN AND HEMATOCRIT, BLOOD
HCT: 35.4 % — ABNORMAL LOW (ref 39.0–52.0)
HCT: 28.6 % — ABNORMAL LOW (ref 39.0–52.0)
Hemoglobin: 11.3 g/dL — ABNORMAL LOW (ref 13.0–17.0)
Hemoglobin: 9.1 g/dL — ABNORMAL LOW (ref 13.0–17.0)

## 2020-11-15 LAB — ECHOCARDIOGRAM COMPLETE
Area-P 1/2: 4.63 cm2
Calc EF: 76 %
S' Lateral: 3.3 cm
Single Plane A2C EF: 75.8 %
Single Plane A4C EF: 75.6 %

## 2020-11-15 LAB — GLUCOSE, CAPILLARY
Glucose-Capillary: 191 mg/dL — ABNORMAL HIGH (ref 70–99)
Glucose-Capillary: 229 mg/dL — ABNORMAL HIGH (ref 70–99)

## 2020-11-15 LAB — PROTIME-INR
INR: 1.9 — ABNORMAL HIGH (ref 0.8–1.2)
Prothrombin Time: 21.3 seconds — ABNORMAL HIGH (ref 11.4–15.2)

## 2020-11-15 LAB — PHOSPHORUS: Phosphorus: 3.8 mg/dL (ref 2.5–4.6)

## 2020-11-15 LAB — RESP PANEL BY RT-PCR (FLU A&B, COVID) ARPGX2
Influenza A by PCR: NEGATIVE
Influenza B by PCR: NEGATIVE
SARS Coronavirus 2 by RT PCR: NEGATIVE

## 2020-11-15 LAB — LIPASE, BLOOD: Lipase: 34 U/L (ref 11–51)

## 2020-11-15 LAB — CBG MONITORING, ED: Glucose-Capillary: 196 mg/dL — ABNORMAL HIGH (ref 70–99)

## 2020-11-15 LAB — AMMONIA: Ammonia: 75 umol/L — ABNORMAL HIGH (ref 9–35)

## 2020-11-15 LAB — MAGNESIUM: Magnesium: 1.2 mg/dL — ABNORMAL LOW (ref 1.7–2.4)

## 2020-11-15 SURGERY — ESOPHAGOGASTRODUODENOSCOPY (EGD) WITH PROPOFOL
Anesthesia: General

## 2020-11-15 SURGERY — IR WITH ANESTHESIA
Anesthesia: General

## 2020-11-15 MED ORDER — MIDAZOLAM HCL (PF) 5 MG/ML IJ SOLN
INTRAMUSCULAR | Status: DC | PRN
  Administered 2020-11-15: 2 mg via INTRAVENOUS
  Administered 2020-11-15: 3 mg via INTRAVENOUS

## 2020-11-15 MED ORDER — LIDOCAINE-EPINEPHRINE 1 %-1:100000 IJ SOLN
INTRAMUSCULAR | Status: AC
  Filled 2020-11-15: qty 1

## 2020-11-15 MED ORDER — LACTATED RINGERS IV SOLN: INTRAVENOUS | Status: DC | PRN

## 2020-11-15 MED ORDER — IOHEXOL 350 MG/ML SOLN
80.0000 mL | Freq: Once | INTRAVENOUS | Status: AC | PRN
  Administered 2020-11-15: 80 mL via INTRAVENOUS

## 2020-11-15 MED ORDER — PANTOPRAZOLE SODIUM 40 MG IV SOLR
40.0000 mg | Freq: Once | INTRAVENOUS | Status: AC
  Administered 2020-11-15: 40 mg via INTRAVENOUS
  Filled 2020-11-15: qty 40

## 2020-11-15 MED ORDER — IOHEXOL 300 MG/ML  SOLN
150.0000 mL | Freq: Once | INTRAMUSCULAR | Status: AC | PRN
  Administered 2020-11-15: 125 mL via INTRAVENOUS

## 2020-11-15 MED ORDER — SODIUM CHLORIDE 0.9% FLUSH: 10.0000 mL | INTRAVENOUS | Status: DC | PRN

## 2020-11-15 MED ORDER — ATROPINE SULFATE 1 MG/10ML IJ SOSY
PREFILLED_SYRINGE | INTRAMUSCULAR | Status: AC
  Filled 2020-11-15: qty 10

## 2020-11-15 MED ORDER — LIPIODOL ULTRAFLUID INJECTION
INTRAMUSCULAR | Status: DC | PRN
  Administered 2020-11-15: 4 mL

## 2020-11-15 MED ORDER — FENTANYL 2500MCG IN NS 250ML (10MCG/ML) PREMIX INFUSION
50.0000 ug/h | INTRAVENOUS | Status: DC
  Administered 2020-11-15: 150 ug/h via INTRAVENOUS
  Administered 2020-11-16: 200 ug/h via INTRAVENOUS
  Filled 2020-11-15 (×2): qty 250

## 2020-11-15 MED ORDER — SUCCINYLCHOLINE CHLORIDE 200 MG/10ML IV SOSY
PREFILLED_SYRINGE | INTRAVENOUS | Status: DC | PRN
  Administered 2020-11-15: 140 mg via INTRAVENOUS

## 2020-11-15 MED ORDER — EPINEPHRINE 1 MG/10ML IJ SOSY
PREFILLED_SYRINGE | INTRAMUSCULAR | Status: AC
  Filled 2020-11-15: qty 10

## 2020-11-15 MED ORDER — SODIUM CHLORIDE 0.9 % IV SOLN
50.0000 ug/h | INTRAVENOUS | Status: DC
  Administered 2020-11-15 – 2020-11-17 (×4): 50 ug/h via INTRAVENOUS
  Filled 2020-11-15 (×8): qty 1

## 2020-11-15 MED ORDER — IOHEXOL 300 MG/ML  SOLN
100.0000 mL | Freq: Once | INTRAMUSCULAR | Status: AC | PRN
  Administered 2020-11-15: 80 mL

## 2020-11-15 MED ORDER — LORAZEPAM 2 MG/ML IJ SOLN: 0.0000 mg | Freq: Three times a day (TID) | INTRAMUSCULAR | Status: DC

## 2020-11-15 MED ORDER — LIDOCAINE 2% (20 MG/ML) 5 ML SYRINGE
INTRAMUSCULAR | Status: DC | PRN
  Administered 2020-11-15: 80 mg via INTRAVENOUS

## 2020-11-15 MED ORDER — ONDANSETRON HCL 4 MG/2ML IJ SOLN
4.0000 mg | Freq: Once | INTRAMUSCULAR | Status: AC
  Administered 2020-11-15: 4 mg via INTRAVENOUS
  Filled 2020-11-15: qty 2

## 2020-11-15 MED ORDER — PHENYLEPHRINE 40 MCG/ML (10ML) SYRINGE FOR IV PUSH (FOR BLOOD PRESSURE SUPPORT)
PREFILLED_SYRINGE | INTRAVENOUS | Status: DC | PRN
  Administered 2020-11-15 (×2): 80 ug via INTRAVENOUS

## 2020-11-15 MED ORDER — SODIUM CHLORIDE 0.9% IV SOLUTION: Freq: Once | INTRAVENOUS | Status: DC

## 2020-11-15 MED ORDER — SODIUM TETRADECYL SULFATE 3 % IV SOLN
INTRAVENOUS | Status: DC | PRN
  Administered 2020-11-15 (×2): 2 mL

## 2020-11-15 MED ORDER — SODIUM CHLORIDE 0.9% FLUSH
10.0000 mL | Freq: Two times a day (BID) | INTRAVENOUS | Status: DC
  Administered 2020-11-16 – 2020-11-20 (×9): 10 mL

## 2020-11-15 MED ORDER — SODIUM CHLORIDE 0.9 % IV SOLN: INTRAVENOUS | Status: DC | PRN

## 2020-11-15 MED ORDER — ONDANSETRON HCL 4 MG/2ML IJ SOLN
INTRAMUSCULAR | Status: DC | PRN
  Administered 2020-11-15: 4 mg via INTRAVENOUS

## 2020-11-15 MED ORDER — SODIUM CHLORIDE 0.9 % IV SOLN
1.0000 g | INTRAVENOUS | Status: AC
  Administered 2020-11-16 – 2020-11-18 (×3): 1 g via INTRAVENOUS
  Filled 2020-11-15 (×3): qty 10

## 2020-11-15 MED ORDER — ALBUMIN HUMAN 5 % IV SOLN: INTRAVENOUS | Status: DC | PRN

## 2020-11-15 MED ORDER — SODIUM CHLORIDE 0.9 % IV SOLN: INTRAVENOUS | Status: DC

## 2020-11-15 MED ORDER — DOCUSATE SODIUM 50 MG/5ML PO LIQD
100.0000 mg | Freq: Two times a day (BID) | ORAL | Status: DC
  Administered 2020-11-15 – 2020-11-17 (×2): 100 mg
  Filled 2020-11-15 (×3): qty 10

## 2020-11-15 MED ORDER — IOHEXOL 350 MG/ML SOLN
100.0000 mL | Freq: Once | INTRAVENOUS | Status: AC | PRN
  Administered 2020-11-15: 100 mL via INTRAVENOUS

## 2020-11-15 MED ORDER — PERFLUTREN LIPID MICROSPHERE
1.0000 mL | INTRAVENOUS | Status: AC | PRN
  Administered 2020-11-15: 3 mL via INTRAVENOUS
  Filled 2020-11-15: qty 10

## 2020-11-15 MED ORDER — SODIUM CHLORIDE 0.9 % IV SOLN
8.0000 mg/h | INTRAVENOUS | Status: DC
  Administered 2020-11-16 – 2020-11-17 (×3): 8 mg/h via INTRAVENOUS
  Filled 2020-11-15 (×4): qty 80

## 2020-11-15 MED ORDER — PROPOFOL 500 MG/50ML IV EMUL
INTRAVENOUS | Status: DC | PRN
  Administered 2020-11-15: 100 ug/kg/min via INTRAVENOUS

## 2020-11-15 MED ORDER — MAGNESIUM SULFATE 2 GM/50ML IV SOLN: 2.0000 g | Freq: Once | INTRAVENOUS | Status: DC

## 2020-11-15 MED ORDER — FOLIC ACID 1 MG PO TABS: 1.0000 mg | ORAL_TABLET | Freq: Every day | ORAL | Status: DC

## 2020-11-15 MED ORDER — POLYETHYLENE GLYCOL 3350 17 G PO PACK
17.0000 g | PACK | Freq: Every day | ORAL | Status: DC
  Administered 2020-11-17: 17 g
  Filled 2020-11-15 (×2): qty 1

## 2020-11-15 MED ORDER — SODIUM CHLORIDE 0.9 % IV BOLUS
1000.0000 mL | Freq: Once | INTRAVENOUS | Status: AC
  Administered 2020-11-15: 1000 mL via INTRAVENOUS

## 2020-11-15 MED ORDER — PANTOPRAZOLE SODIUM 40 MG IV SOLR
40.0000 mg | Freq: Two times a day (BID) | INTRAVENOUS | Status: DC
Start: 2020-11-18 — End: 2020-11-15

## 2020-11-15 MED ORDER — PROCHLORPERAZINE EDISYLATE 10 MG/2ML IJ SOLN
10.0000 mg | Freq: Once | INTRAMUSCULAR | Status: AC
  Administered 2020-11-15: 10 mg via INTRAVENOUS
  Filled 2020-11-15: qty 2

## 2020-11-15 MED ORDER — FENTANYL BOLUS VIA INFUSION
50.0000 ug | INTRAVENOUS | Status: DC | PRN
  Filled 2020-11-15: qty 50

## 2020-11-15 MED ORDER — INSULIN ASPART 100 UNIT/ML ~~LOC~~ SOLN
0.0000 [IU] | SUBCUTANEOUS | Status: DC
  Administered 2020-11-15: 3 [IU] via SUBCUTANEOUS
  Administered 2020-11-15: 2 [IU] via SUBCUTANEOUS
  Administered 2020-11-16: 5 [IU] via SUBCUTANEOUS
  Administered 2020-11-16: 3 [IU] via SUBCUTANEOUS

## 2020-11-15 MED ORDER — PROPOFOL 10 MG/ML IV BOLUS
INTRAVENOUS | Status: DC | PRN
  Administered 2020-11-15: 170 mg via INTRAVENOUS

## 2020-11-15 MED ORDER — LIDOCAINE HCL 1 % IJ SOLN
INTRAMUSCULAR | Status: AC
  Filled 2020-11-15: qty 20

## 2020-11-15 MED ORDER — THIAMINE HCL 100 MG/ML IJ SOLN
100.0000 mg | Freq: Every day | INTRAMUSCULAR | Status: DC
  Administered 2020-11-16 – 2020-11-17 (×2): 100 mg via INTRAVENOUS
  Filled 2020-11-15 (×2): qty 2

## 2020-11-15 MED ORDER — CHLORHEXIDINE GLUCONATE CLOTH 2 % EX PADS
6.0000 | MEDICATED_PAD | Freq: Every day | CUTANEOUS | Status: DC
  Administered 2020-11-16 – 2020-11-19 (×5): 6 via TOPICAL

## 2020-11-15 MED ORDER — VASOPRESSIN 20 UNITS/100 ML INFUSION FOR SHOCK
0.0000 [IU]/min | INTRAVENOUS | Status: DC
  Administered 2020-11-15: .04 [IU]/min via INTRAVENOUS
  Administered 2020-11-16: 0.03 [IU]/min via INTRAVENOUS
  Filled 2020-11-15 (×2): qty 100

## 2020-11-15 MED ORDER — ROCURONIUM BROMIDE 10 MG/ML (PF) SYRINGE
PREFILLED_SYRINGE | INTRAVENOUS | Status: DC | PRN
  Administered 2020-11-15 (×2): 50 mg via INTRAVENOUS

## 2020-11-15 MED ORDER — ADULT MULTIVITAMIN W/MINERALS CH
1.0000 | ORAL_TABLET | Freq: Every day | ORAL | Status: DC
  Administered 2020-11-17: 1 via ORAL
  Filled 2020-11-15 (×2): qty 1

## 2020-11-15 MED ORDER — NOREPINEPHRINE 4 MG/250ML-% IV SOLN
INTRAVENOUS | Status: DC | PRN
  Administered 2020-11-15: 2 ug/min via INTRAVENOUS

## 2020-11-15 MED ORDER — FENTANYL CITRATE (PF) 100 MCG/2ML IJ SOLN: 50.0000 ug | Freq: Once | INTRAMUSCULAR | Status: DC

## 2020-11-15 MED ORDER — PANTOPRAZOLE SODIUM 40 MG IV SOLR: 40.0000 mg | Freq: Two times a day (BID) | INTRAVENOUS | Status: DC

## 2020-11-15 MED ORDER — IOHEXOL 300 MG/ML  SOLN
100.0000 mL | Freq: Once | INTRAMUSCULAR | Status: AC | PRN
  Administered 2020-11-15: 60 mL

## 2020-11-15 MED ORDER — ROCURONIUM BROMIDE 10 MG/ML (PF) SYRINGE
PREFILLED_SYRINGE | INTRAVENOUS | Status: DC | PRN
  Administered 2020-11-15: 30 mg via INTRAVENOUS
  Administered 2020-11-15: 20 mg via INTRAVENOUS
  Administered 2020-11-15: 30 mg via INTRAVENOUS
  Administered 2020-11-15: 50 mg via INTRAVENOUS

## 2020-11-15 MED ORDER — MIDAZOLAM HCL (PF) 5 MG/ML IJ SOLN
INTRAMUSCULAR | Status: AC
  Filled 2020-11-15: qty 1

## 2020-11-15 MED ORDER — VASOPRESSIN 20 UNITS/100 ML INFUSION FOR SHOCK
INTRAVENOUS | Status: DC | PRN
  Administered 2020-11-15: .03 [IU]/min via INTRAVENOUS

## 2020-11-15 MED ORDER — VASOPRESSIN 20 UNIT/ML IV SOLN
INTRAVENOUS | Status: DC | PRN
  Administered 2020-11-15 (×2): 2 [IU] via INTRAVENOUS
  Administered 2020-11-15: 3 [IU] via INTRAVENOUS

## 2020-11-15 MED ORDER — SODIUM CHLORIDE 0.9 % IV SOLN
80.0000 mg | Freq: Once | INTRAVENOUS | Status: DC
  Filled 2020-11-15: qty 80

## 2020-11-15 MED ORDER — OCTREOTIDE LOAD VIA INFUSION
50.0000 ug | Freq: Once | INTRAVENOUS | Status: AC
  Administered 2020-11-15: 50 ug via INTRAVENOUS
  Filled 2020-11-15: qty 25

## 2020-11-15 MED ORDER — LORAZEPAM 2 MG/ML IJ SOLN: 0.0000 mg | Freq: Four times a day (QID) | INTRAMUSCULAR | Status: DC

## 2020-11-15 MED ORDER — IOHEXOL 300 MG/ML  SOLN
150.0000 mL | Freq: Once | INTRAMUSCULAR | Status: AC | PRN
  Administered 2020-11-15: 35 mL

## 2020-11-15 MED ORDER — THIAMINE HCL 100 MG PO TABS
100.0000 mg | ORAL_TABLET | Freq: Every day | ORAL | Status: DC
  Filled 2020-11-15: qty 1

## 2020-11-15 MED ORDER — PHENYLEPHRINE HCL (PRESSORS) 10 MG/ML IV SOLN
INTRAVENOUS | Status: DC | PRN
  Administered 2020-11-15 (×3): 120 ug via INTRAVENOUS
  Administered 2020-11-15: 80 ug via INTRAVENOUS
  Administered 2020-11-15: 120 ug via INTRAVENOUS

## 2020-11-15 MED ORDER — SODIUM CHLORIDE 0.9 % IV SOLN
1.0000 g | Freq: Once | INTRAVENOUS | Status: AC
  Administered 2020-11-15: 1 g via INTRAVENOUS
  Filled 2020-11-15: qty 10

## 2020-11-15 MED ORDER — PROPOFOL 1000 MG/100ML IV EMUL: 0.0000 ug/kg/min | INTRAVENOUS | Status: DC

## 2020-11-15 SURGICAL SUPPLY — 14 items

## 2020-11-15 NOTE — Anesthesia Postprocedure Evaluation (Signed)
Anesthesia Post Note  Patient: Bradley Miles  Procedure(s) Performed: IR WITH ANESTHESIA (N/A )     Patient location during evaluation: SICU Anesthesia Type: General Level of consciousness: sedated Pain management: pain level controlled Vital Signs Assessment: post-procedure vital signs reviewed and stable Respiratory status: patient remains intubated per anesthesia plan Cardiovascular status: stable Postop Assessment: no apparent nausea or vomiting Anesthetic complications: no   No complications documented.  Last Vitals:  Vitals:   11/15/20 1415 11/15/20 1927  BP: (!) 124/57   Pulse: (!) 107   Resp: 20   Temp:    SpO2: 99% 100%    Last Pain:  Vitals:   11/15/20 1032  TempSrc: Temporal  PainSc: 0-No pain                 Telina Kleckley DANIEL

## 2020-11-15 NOTE — H&P (Signed)
NAME:  Bradley Miles, MRN:  [id number], DOB:  Apr 22, 1966, LOS: 0 ADMISSION DATE:  11/15/2020, CONSULTATION DATE:  11/15/2020 REFERRING MD:  Dr. Leonette Monarch, CHIEF COMPLAINT:  Hematemesis  Brief History   54 year old male with history of ongoing ETOH abuse (last drink 00/9), alcoholic cirrhosis, hepatic encephalopathy, and esophageal varices/ portal gastropathy s/p TIPS and BRTO 11/2019 presenting with one day history of hematemesis, black stools and increasing confusion.  Several episodes of hematemesis in ER.  Initial Hgb 13.2.  IR and GI consulted.  Pending CTA abd/ pelvis.  PCCM to admit.   History of present illness   HPI obtained mostly from patient's wife, [name], at bedside, as patient remains encephalopathic.    54 year old male with history of ongoing ETOH abuse, alcoholic cirrhosis, hepatic encephalopathy, esophageal varices/ portal gastropathy s/p TIPS and BRTO 11/2019, tobacco abuse, DM, GERD, HTN, and anxiety presenting from home with one day history of worsening confusion, hematemesis, and black stools.   Patient's primary GI is Dr. Gala Romney in [place].  Previous admit in 07/2020 for hepatic encephalopathy and GIB, underwent upper endoscopy without findings of active bleeding, grade 1-2 esophageal varices, and portal gastropathy, thought to be likely source of bleeding.  Wife reports he continues to drink at least 6 drinks he gets from convenience store and possibly more as sometimes he hides drinks.  He is not compliant with his medications, often skipping days at times.  Wife reports he was in his normal state of health prior to yesterday and denies any fever, chills, or abdominal pain.    In ER, he remains tachycardic with stable blood pressure.  Requiring supplemental O2 [place] at 2L.  Remains encephalopathic but easily awakens and answers questions.  Labs noted for normal WBC at 7.5, Hgb 13.2, platelet clumps, pending coags, glucose 258, low protein and albumin, normal LFTs except t. Bili  2.6, non acute CXR and pending CTA abd/ pelvis.  Wife reports probable 4 episodes of hematemesis since starting last night, with most recent episode in ER with ~ 639m of bloody emesis.  [facility] and IR consulted.  PCCM called for admit.   Past Medical History  Ongoing ETOH abuse, alcoholic cirrhosis, hepatic encephalopathy, esophageal varices/ portal gastropathy s/p TIPS and BRTO 11/2019, tobacco abuse, DM, GERD, HTN, anxiety  Significant Hospital Events   12/7 Admit  Consults:  12/7 [facility]  12/7 IR  Procedures:   Significant Diagnostic Tests:  12/7 CTA abd/ pelvis >>  Micro Data:  12/7 SARS2/ Flu  >>  Antimicrobials:  12/7 ceftriaxone    Interim history/subjective:   Objective   Blood pressure 137/84, pulse (!) 116, temperature 97.9 F (36.6 C), resp. rate (!) 26, SpO2 93 %.        Intake/Output Summary (Last 24 hours) at 11/15/2020 0750 Last data filed at 11/15/2020 [id number] per 24 hour  Intake 1100 ml  Output 600 ml  Net 500 ml   There were no vitals filed for this visit.  Examination: General:  Chronically ill appearing male in NAD  HEENT: MM pink/moist, pupils 3/reactive, mild icterus, dried blood on lips Neuro: Easily awakens to voice and answers correctly to person, place, time and event.  MAE CV: rr, no murmur, ST, +2 pulses PULM:  sonorus at times, lungs clear, non labored GI: obese, soft, NT, +bs, no obvious fluid wave Extremities: warm/dry, LLL edema +1-2 with erythema (chronic per wife)  Skin: no rashes   Resolved Hospital Problem list  Assessment & Plan:   Hematemesis with history of esophageal varices and portal gastropathy s/p prior TIPS and BRTO (11/2019) secondary to ongoing ETOH abuse and alcoholic cirrhosis P:  IR and [facility] consulted by EDP Admit to ICU for close monitoring.  High intubation risk.  Currently protecting his airway, with ongoing tachycardia but BP remains stable. Patient is difficult IV access, if IV team unable  to place 2nd large bore IV, given unknown coags, will proceed with PICC placement Pending results of CTA abd/ pelvis  Continue octreotide gtt per GI  PPI BID  Continue ceftriaxone for SBP prophylaxis  S/p 2L NS Serial H/Hs now then q 6hr T&S completed Pending coags and trend Trend CMET   Hepatic encephalopathy  P:  Checking ammonia Hold on PO lactulose for now Ongoing neuro exams    At risk for ETOH withdrawal - last drink 12/6 P:  CIWA with ativan Daily MVI/ thiamine/ folate   DMT2 - poorly controlled, HA1c 8.4 10/24/2020 P:  No AG or evidence of DKA.  SSI sensitive    Tobacco abuse P:  Nicotine patch prn   LLL swelling - wife reports is chronic from his PVD, but remains warm.  Previous doppler study negative 10/2019 P:  Doppler study for completeness to rule out DVT   Best practice (evaluated daily)   Diet: NPO Pain/Anxiety/Delirium protocol (if indicated): CIWA VAP protocol (if indicated): n/a DVT prophylaxis: SCDs GI prophylaxis: PPI Glucose control: SSI Mobility: Bedrest last date of multidisciplinary goals of care discussion: 12/7 Family and staff present: wife/ NP Summary of discussion: continue full medical care Follow up goals of care discussion due 12/14 Code Status: Full Disposition: admit to ICU  Labs   CBC: Recent Labs  Lab 11/15/20 0500  WBC 7.5  HGB 13.2  HCT 39.1  MCV 108.9*  PLT PLATELET CLUMPS NOTED ON SMEAR, UNABLE TO ESTIMATE    Basic Metabolic Panel: Recent Labs  Lab 11/15/20 0500  NA 140  K 4.5  CL 104  CO2 27  GLUCOSE 258*  BUN 12  CREATININE 0.90  CALCIUM 8.3*   GFR: Estimated Creatinine Clearance: 133.8 mL/min (by C-G formula based on SCr of 0.9 mg/dL). Recent Labs  Lab 11/15/20 0500  WBC 7.5    Liver Function Tests: Recent Labs  Lab 11/15/20 0500  AST 38  ALT 18  ALKPHOS 111  BILITOT 2.6*  PROT 6.2*  ALBUMIN 2.5*   Recent Labs  Lab 11/15/20 0500  LIPASE 34   No results for input(s):  AMMONIA in the last 168 hours.  ABG    Component Value Date/Time   PHART 7.337 (L) 08/05/2020 0625   PCO2ART 59.7 (H) 08/05/2020 0625   PO2ART 123 (H) 08/05/2020 0625   HCO3 28.4 (H) 08/05/2020 0625   TCO2 26 11/24/2019 1940   ACIDBASEDEF 1.0 11/24/2019 1940   O2SAT 98.1 08/05/2020 0625     Coagulation Profile: No results for input(s): INR, PROTIME in the last 168 hours.  Cardiac Enzymes: No results for input(s): CKTOTAL, CKMB, CKMBINDEX, TROPONINI in the last 168 hours.  HbA1C: Hgb A1c MFr Bld  Date/Time Value Ref Range Status  10/24/2020 01:46 PM 8.4 (H) 4.8 - 5.6 % Final    Comment:             Prediabetes: 5.7 - 6.4          Diabetes: >6.4          Glycemic control for adults with diabetes: <7.0   06/20/2020 08:55  AM 6.9 (H) 4.8 - 5.6 % Final    Comment:             Prediabetes: 5.7 - 6.4          Diabetes: >6.4          Glycemic control for adults with diabetes: <7.0     CBG: No results for input(s): GLUCAP in the last 168 hours.  Review of Systems:   Review of Systems  Constitutional: Negative for chills and fever.  Respiratory: Negative for cough, hemoptysis and shortness of breath.   Cardiovascular: Positive for leg swelling. Negative for chest pain and orthopnea.       Chronic LLL swelling per wife  Gastrointestinal: Positive for blood in stool and vomiting. Negative for abdominal pain and nausea.       Hematemesis and black stools  Neurological: Negative for focal weakness and loss of consciousness.   Past Medical History  He,  has a past medical history of Anxiety, Controlled type 2 diabetes mellitus with complication, without long-term current use of insulin ([place]) (06/26/2020), Enlarged liver, GERD (gastroesophageal reflux disease), Hypertension, Palpitations, PVC's (premature ventricular contractions), Shortness of breath, Tussive syncope (12/22/2019), and Varicose veins.   Surgical History    Past Surgical History:  Procedure Laterality Date  .  BIOPSY  02/02/2019   Procedure: BIOPSY;  Surgeon: Daneil Dolin, MD;  [facility];  Service: Endoscopy;;  gastric  . COLONOSCOPY WITH ESOPHAGOGASTRODUODENOSCOPY (EGD)  12/16/2012   internal hemorrhoids, colonic diverticulosis, benign polyps, screening in 2024. EGD with mild erosive reflux esophagitis, small hiatal hernia, negative H.pylori  . cyst reomved     from spine  . ESOPHAGOGASTRODUODENOSCOPY  05/19/09   RMR: Geographic distal esophageal erosions consistent with severe erosive reflux esophagitis. schatzki's ring s/P dilation/small hiatal hernia otherwise normal stomach  . ESOPHAGOGASTRODUODENOSCOPY (EGD) WITH PROPOFOL N/A 02/02/2019   Dr. Gala Romney: retained gastric contents. portal HTN gastropathy  . ESOPHAGOGASTRODUODENOSCOPY (EGD) WITH PROPOFOL N/A 11/20/2019   Procedure: ESOPHAGOGASTRODUODENOSCOPY (EGD) WITH PROPOFOL;  Surgeon: Daneil Dolin, MD;  [facility];  Service: Endoscopy;  Laterality: N/A;  . ESOPHAGOGASTRODUODENOSCOPY (EGD) WITH PROPOFOL N/A 11/24/2019   Procedure: ESOPHAGOGASTRODUODENOSCOPY (EGD) WITH PROPOFOL;  Surgeon: Ronnette Juniper, MD;  Location: [facility];  Service: Gastroenterology;  Laterality: N/A;  . ESOPHAGOGASTRODUODENOSCOPY (EGD) WITH PROPOFOL N/A 08/08/2020   Procedure: ESOPHAGOGASTRODUODENOSCOPY (EGD) WITH PROPOFOL;  Surgeon: Daneil Dolin, MD;  [facility];  Service: Endoscopy;  Laterality: N/A;  . IR ANGIOGRAM SELECTIVE EACH ADDITIONAL VESSEL  11/22/2019  . IR ANGIOGRAM SELECTIVE EACH ADDITIONAL VESSEL  11/24/2019  . IR ANGIOGRAM SELECTIVE EACH ADDITIONAL VESSEL  11/24/2019  . IR ANGIOGRAM SELECTIVE EACH ADDITIONAL VESSEL  11/24/2019  . IR EMBO ART  VEN HEMORR LYMPH EXTRAV  INC GUIDE ROADMAPPING  11/22/2019  . IR EMBO ART  VEN HEMORR LYMPH EXTRAV  INC GUIDE ROADMAPPING  11/24/2019  . IR RADIOLOGIST EVAL & MGMT  12/29/2019  . IR RADIOLOGIST EVAL & MGMT  10/27/2020  . IR TIPS  11/24/2019  . IR US GUIDE VASC ACCESS RIGHT   11/22/2019  . IR VENOGRAM RENAL UNI LEFT  11/22/2019  . RADIOLOGY WITH ANESTHESIA N/A 11/24/2019   Procedure: IR WITH ANESTHESIA;  Surgeon: Radiologist, Medication, MD;  Location: [facility];  Service: Radiology;  Laterality: N/A;  . TIPS PROCEDURE N/A 11/22/2019   Procedure: BRTO/TRANS-JUGULAR INTRAHEPATIC PORTAL SHUNT (TIPS);  Surgeon: Corrie Mckusick, DO;  Location: [facility];  Service: Anesthesiology;  Laterality: N/A;  .  WISDOM TOOTH EXTRACTION       Social History   reports that he has been smoking cigarettes. He started smoking about 40 years ago. He has a 30.00 pack-year smoking history. He has never used smokeless tobacco. He reports current alcohol use. He reports that he does not use drugs.   Family History   His family history includes Brain cancer in his father; Cancer in his mother; Colon polyps (age of onset: 60) in his sister; Heart disease in his mother. There is no history of Liver disease.   Allergies No Known Allergies   Home Medications  Prior to Admission medications   Medication Sig Start Date End Date Taking? Authorizing Provider  allopurinol (ZYLOPRIM) 100 MG tablet TAKE 1 TABLET BY MOUTH EVERY DAY Patient taking differently: Take 100 mg by mouth daily.  12/10/19   [name], MD  b complex vitamins tablet Take 1 tablet by mouth daily.    [provider]  blood glucose meter kit and supplies Dispense based on patient and insurance preference. Test blood sugar once daily.  (FOR ICD-10 E10.9, E11.9). 04/21/20   Erven Colla, DO  Cholecalciferol (D3-1000) 25 MCG (1000 UT) capsule Take 1,000 Units by mouth daily.    [provider]  dexlansoprazole (DEXILANT) 60 MG capsule Take 1 capsule (60 mg total) by mouth daily. 08/08/20   Roxan Hockey, MD  ferrous sulfate 325 (65 FE) MG EC tablet Take 1 tablet (325 mg total) by mouth 2 (two) times daily. Patient not taking: Reported on 08/05/2020 11/29/19 06/21/20  Elgergawy, Silver Huguenin, MD  ferrous sulfate 325  (65 FE) MG EC tablet Take 1 tablet (325 mg total) by mouth 2 (two) times daily. 08/08/20 09/07/20  Roxan Hockey, MD  folic acid (FOLVITE) 094 MCG tablet Take 400 mcg by mouth daily.    [provider]  furosemide (LASIX) 40 MG tablet Take 0.5 tablets (20 mg total) by mouth daily. Take Daily for 5 Days, Then Take Daily Only As Needed for Edema 08/08/20   Roxan Hockey, MD  lactulose, encephalopathy, (CHRONULAC) 10 GM/15ML SOLN Take 15-30cc 1-2 daily to Titrate for 3-4 soft bowel movements a day 11/10/20   Carlis Stable, NP  LORazepam (ATIVAN) 1 MG tablet Take 1 mg by mouth as needed for anxiety.    [provider]  Multiple Vitamin (MULTIVITAMIN WITH MINERALS) TABS tablet Take 1 tablet by mouth daily. 11/30/19   Elgergawy, Silver Huguenin, MD  nadolol (CORGARD) 40 MG tablet Take 1.5 tablets (60 mg total) by mouth daily. 08/08/20   Roxan Hockey, MD  naltrexone (DEPADE) 50 MG tablet Take 1 tablet p.o. daily and on every 6th day take 2 tablets. 04/04/20   Lovena Le, Malena M, DO  ondansetron (ZOFRAN-ODT) 4 MG disintegrating tablet DISSOLVE 1 TABLET ON THE TONGUE EVERY 8 HOURS AS NEEDED FOR NAUSEA Patient taking differently: Take 4 mg by mouth every 8 (eight) hours as needed for nausea or vomiting.  01/20/20   Annitta Needs, NP  PARoxetine (PAXIL) 20 MG tablet Take 1 tablet (20 mg total) by mouth daily. 08/08/20   Roxan Hockey, MD  potassium chloride SA (KLOR-CON) 20 MEQ tablet Take 1 tablet (20 mEq total) by mouth daily as needed (Take when taking Lasix). 08/08/20   Roxan Hockey, MD  rifaximin (XIFAXAN) 550 MG TABS tablet Take 1 tablet (550 mg total) by mouth 2 (two) times daily. 11/10/20   Carlis Stable, NP  sildenafil (VIAGRA) 100 MG tablet TAKE 1/2 TABLET  BY MOUTH EVERY DAY Patient taking differently: Take 50 mg by mouth as needed for erectile dysfunction.  03/29/20   [name], MD  sitaGLIPtin (JANUVIA) 50 MG tablet Take 1 tablet (50 mg total) by mouth daily. 10/25/20   Lovena Le,  Malena M, DO  sucralfate (CARAFATE) 1 g tablet TAKE 1 TABLET BY MOUTH BEFORE MEALS 10/25/20   Lovena Le, Malena M, DO  traZODone (DESYREL) 50 MG tablet TAKE 1 TABLET BY MOUTH AT BEDTIME AS NEEDED FOR INSOMNIA 08/18/20   Lovena Le, Malena M, DO  venlafaxine (EFFEXOR) 25 MG tablet Take 25 mg by mouth 2 (two) times daily. Pt isn't aware of the dosage    [provider]     Critical care time: 66 mins     Kennieth Rad, ACNP [facility] 11/15/2020, 9:22 AM

## 2020-11-15 NOTE — Consult Note (Addendum)
Referring Provider:  [facility]         Primary Care Physician:  Erven Colla, DO Primary Gastroenterologist:  Manus Rudd, MD       We were asked to see this patient for:    hematemesis              ASSESSMENT / PLAN:   #54 year old male with decompensated Etoh cirrhosis (MELD-NA 13), ongoing Etoh abuse and recurrent upper GI bleeding with several episodes of hematemesis.  He is status post TIPS, embolization and most recently BRTO.  Multiple TIPS duplex have shown patency including one on 10/27/20. CT angio today suggests patent TIPS, multiple variceal embolization coils. However, there is evidence for residual varices at the gastric cardia region. Despite having had a BRTO procedure, there appears to be residual flow in a gastro renal shunt. Gastro renal shunt may be supplying the gastric cardia varices.  Patient is tachycardic but otherwise hemodynamically stable.  Hemoglobin 13.  INR 1.3. PCCM is admitting to ICU.  --Keep n.p.o. for EGD this am.  --Continue octreotide drip --Change PPI to continuous infusion --Monitor hemoglobin, transfuse if needed --Depending on EGD findings we may need to consult IR --Rocephin for SBP prophylaxis --Following EGD this morning he will need to get lactulose and Xifaxan.  Wife says he has been confused for a couple of days. He is lethargic on my exam but will wake up when prompted and is oriented  If not alert enough for p.o. after EGD then needs lactulose enemas --CIWA protocol per PCCM.      Attending Physician Note   I have taken a history, examined the patient and reviewed the chart. I agree with the Advanced Practitioner's note, impression and recommendations.   Decompensated alcoholic cirrhosis with ongoing alcohol abuse and a recurrent UGI bleed with large volume hematemesis today. S/P TIPS, coil embolization of varices and BRTO. [place] on 11/15 showed a patent TIPS. CT angio today shows a patent TIPS, multiple variceal embolization  coils, residual gastric cardia varices, residual flow in a gastro renal shunt which may be supplying the gastric varices, cirrhosis, small amount of ascites, thickening of the colon was most prominent in the distal sigmoid and rectum, cholelithiasis.   Medication recommendations as above  Urgent EGD today - R/O UGI bleed from varices, gastropathy, ulcer, MW tear, etc. IR consult  Monitor for alcohol withdrawal   [name], MD [facility]    HPI:                                                                                                                             Chief Complaint: Vomiting blood  GAL FELDHAUS is a 54 y.o. male with a pmh significant for, not necessarily limited to: Cirrhosis with portal hypertension and history of upper GI bleeding, status post TIPS and BRTO,  Etoh abuse, cholelithiasis, GERD, anxiety, hypertension, DM  Patient is a 54 year old male with history of alcohol  abuse, cirrhosis with portal hypertension.  In December 2020 he underwent TIPS and embolization with IR. He has continued to have recurrent upper GI bleeding. In August 2021 EGD for UGI bleed showed grade 1-2 esophageal varices. Bleeding was felt to be secondary to portal HTN.   He has had several TIPS duplex including 10/24/2020 which showed TIPS patency.  He had a virtual visit with IR on 10/27/2020.  Plan was for duplex surveillance of the TIPS in 4 months unless he had recurrent symptoms/recurrent varices on the upper Endo at which time a more invasive study/CT imaging could be useful.   Patient began vomiting bright red blood approximately 10 hours ago per wife who is at the bedside.  History comes from the wife as patient is lethargic.  He is still actively drinking alcohol and was drinking at the time vomiting started.  The first episode of vomiting consisted of bright red blood.  He has also passed some dark stool per wife.  No NSAID use.  At home he takes lactulose,  Xifaxan, nadolol and is on a PPI.  He is also on iron supplements among several other medications.  In the ED his blood pressure is stable but he is tachycardic in the 120s.  He has been tachypneic at times.  Hemoglobin 13.2, hematocrit 39, platelets are clumped.  BUN and creatinine are normal.  Sodium 140, total bilirubin 2.6, alk phos 111, liver enzymes normal.  INR 1.3 no cardiopulmonary findings on chest x-ray.  CT angio of the abdomen and pelvis suggest patent TIPS, evidence for residual varices at the gastric cardia region.  See report below    [place] / PERTINENT STUDIES   January 2014 colonoscopy.   --Complete exam adequate prep --Hemorrhoids, diverticulosis and small hyperplastic polyp   08/08/20 EGD for hematemesis -Grade 1-2 esophageal varices?innocent appearing. Portal gastropathy -likely source of hematemesis in the setting of nausea and vomiting   10/24/20 US ABDOMINAL PELVIC ART/VENT FLOW DOPPLER  Addendum Date: 10/24/2020   ADDENDUM REPORT: 10/24/2020 15:08 ADDENDUM: Small volume ascites and signs of liver disease/cirrhosis as before. Electronically Signed   By: Zetta Bills M.D.   On: 10/24/2020 15:08   Result Date: 10/24/2020 CLINICAL DATA:  Tips, bleeding esophageal varices follow-up evaluation. EXAM: DUPLEX ULTRASOUND OF LIVER AND TIPS SHUNT TECHNIQUE: Color and duplex Doppler ultrasound was performed to evaluate the hepatic in-flow and out-flow vessels. COMPARISON:  Comparison study from August 05, 2020 FINDINGS: Portal Vein Velocities Main:  32 cm/sec Right:  123 cm/sec Left:  18 cm/sec TIPS Stent Velocities Proximal:  123 cm/sec Mid:  163 Distal:  117 cm/sec IVC: IVC is patent. Hepatic Vein Velocities Right:  117 cm/sec Mid:  14 cm/sec Left:  13 cm/sec Splenic Vein: Patent, 17 centimeters/second. Superior Mesenteric Vein: Not visualized Hepatic Artery: 197 cm per second Ascities: Small amount of ascites. Varices: Not seen Other findings: None  IMPRESSION: 1. Tips is patent with antegrade flow. Flow velocities are similar to the prior exam. 2. Portal vein is patent. 3. Hepatofugal flow in the LEFT portal vein. 4. Elevated velocities in the hepatic artery, not as elevated as on the previous ultrasound evaluation may be in part artifactual. The waveforms are normal. Electronically Signed: By: Zetta Bills M.D. On: 10/24/2020 14:56   11/15/2020  CT angio abdomen/pelvis with and without contrast IMPRESSION: VASCULAR  1. Stable appearance of the TIPS stent. The stent appears to be patent based on the venous phase imaging but this could be better characterized with  a dedicated liver duplex ultrasound. 2. Multiple variceal embolization coils. However, there is evidence for residual varices at the gastric cardia region. Despite having had a BRTO procedure, there appears to be residual flow in a gastro renal shunt. Gastro renal shunt may be supplying the gastric cardia varices. 3. No evidence for active arterial bleeding.  NON-VASCULAR  1. Cirrhosis with a small amount of ascites. Spleen is prominent for size. 2. Diffuse wall thickening in the colon, most prominent in the rectum and distal sigmoid colon region. Findings are concerning for colitis. Etiology is uncertain but this does represent a change since 08/05/2020. 3. Cholelithiasis.  Past Medical History:  Diagnosis Date  . Anxiety   . Controlled type 2 diabetes mellitus with complication, without long-term current use of insulin ([place]) 06/26/2020  . Enlarged liver   . GERD (gastroesophageal reflux disease)   . Hypertension   . Palpitations   . PVC's (premature ventricular contractions)   . Shortness of breath   . Tussive syncope 12/22/2019  . Varicose veins     Past Surgical History:  Procedure Laterality Date  . BIOPSY  02/02/2019   Procedure: BIOPSY;  Surgeon: Daneil Dolin, MD;  [facility];  Service: Endoscopy;;  gastric  . COLONOSCOPY WITH  ESOPHAGOGASTRODUODENOSCOPY (EGD)  12/16/2012   internal hemorrhoids, colonic diverticulosis, benign polyps, screening in 2024. EGD with mild erosive reflux esophagitis, small hiatal hernia, negative H.pylori  . cyst reomved     from spine  . ESOPHAGOGASTRODUODENOSCOPY  05/19/09   RMR: Geographic distal esophageal erosions consistent with severe erosive reflux esophagitis. schatzki's ring s/P dilation/small hiatal hernia otherwise normal stomach  . ESOPHAGOGASTRODUODENOSCOPY (EGD) WITH PROPOFOL N/A 02/02/2019   Dr. Gala Romney: retained gastric contents. portal HTN gastropathy  . ESOPHAGOGASTRODUODENOSCOPY (EGD) WITH PROPOFOL N/A 11/20/2019   Procedure: ESOPHAGOGASTRODUODENOSCOPY (EGD) WITH PROPOFOL;  Surgeon: Daneil Dolin, MD;  [facility];  Service: Endoscopy;  Laterality: N/A;  . ESOPHAGOGASTRODUODENOSCOPY (EGD) WITH PROPOFOL N/A 11/24/2019   Procedure: ESOPHAGOGASTRODUODENOSCOPY (EGD) WITH PROPOFOL;  Surgeon: Ronnette Juniper, MD;  Location: [facility];  Service: Gastroenterology;  Laterality: N/A;  . ESOPHAGOGASTRODUODENOSCOPY (EGD) WITH PROPOFOL N/A 08/08/2020   Procedure: ESOPHAGOGASTRODUODENOSCOPY (EGD) WITH PROPOFOL;  Surgeon: Daneil Dolin, MD;  [facility];  Service: Endoscopy;  Laterality: N/A;  . IR ANGIOGRAM SELECTIVE EACH ADDITIONAL VESSEL  11/22/2019  . IR ANGIOGRAM SELECTIVE EACH ADDITIONAL VESSEL  11/24/2019  . IR ANGIOGRAM SELECTIVE EACH ADDITIONAL VESSEL  11/24/2019  . IR ANGIOGRAM SELECTIVE EACH ADDITIONAL VESSEL  11/24/2019  . IR EMBO ART  VEN HEMORR LYMPH EXTRAV  INC GUIDE ROADMAPPING  11/22/2019  . IR EMBO ART  VEN HEMORR LYMPH EXTRAV  INC GUIDE ROADMAPPING  11/24/2019  . IR RADIOLOGIST EVAL & MGMT  12/29/2019  . IR RADIOLOGIST EVAL & MGMT  10/27/2020  . IR TIPS  11/24/2019  . IR US GUIDE VASC ACCESS RIGHT  11/22/2019  . IR VENOGRAM RENAL UNI LEFT  11/22/2019  . RADIOLOGY WITH ANESTHESIA N/A 11/24/2019   Procedure: IR WITH ANESTHESIA;  Surgeon:  Radiologist, Medication, MD;  Location: [facility];  Service: Radiology;  Laterality: N/A;  . TIPS PROCEDURE N/A 11/22/2019   Procedure: BRTO/TRANS-JUGULAR INTRAHEPATIC PORTAL SHUNT (TIPS);  Surgeon: Corrie Mckusick, DO;  Location: [facility];  Service: Anesthesiology;  Laterality: N/A;  . WISDOM TOOTH EXTRACTION      Prior to Admission medications   Medication Sig Start Date End Date Taking? Authorizing Provider  allopurinol (ZYLOPRIM) 100 MG tablet TAKE 1 TABLET  BY MOUTH EVERY DAY Patient taking differently: Take 100 mg by mouth daily.  12/10/19   [name], MD  b complex vitamins tablet Take 1 tablet by mouth daily.    [provider]  blood glucose meter kit and supplies Dispense based on patient and insurance preference. Test blood sugar once daily.  (FOR ICD-10 E10.9, E11.9). 04/21/20   Erven Colla, DO  Cholecalciferol (D3-1000) 25 MCG (1000 UT) capsule Take 1,000 Units by mouth daily.    [provider]  dexlansoprazole (DEXILANT) 60 MG capsule Take 1 capsule (60 mg total) by mouth daily. 08/08/20   Roxan Hockey, MD  ferrous sulfate 325 (65 FE) MG EC tablet Take 1 tablet (325 mg total) by mouth 2 (two) times daily. Patient not taking: Reported on 08/05/2020 11/29/19 06/21/20  Elgergawy, Silver Huguenin, MD  ferrous sulfate 325 (65 FE) MG EC tablet Take 1 tablet (325 mg total) by mouth 2 (two) times daily. 08/08/20 09/07/20  Roxan Hockey, MD  folic acid (FOLVITE) 726 MCG tablet Take 400 mcg by mouth daily.    [provider]  furosemide (LASIX) 40 MG tablet Take 0.5 tablets (20 mg total) by mouth daily. Take Daily for 5 Days, Then Take Daily Only As Needed for Edema 08/08/20   Roxan Hockey, MD  lactulose, encephalopathy, (CHRONULAC) 10 GM/15ML SOLN Take 15-30cc 1-2 daily to Titrate for 3-4 soft bowel movements a day 11/10/20   Carlis Stable, NP  LORazepam (ATIVAN) 1 MG tablet Take 1 mg by mouth as needed for anxiety.    [provider]  Multiple Vitamin  (MULTIVITAMIN WITH MINERALS) TABS tablet Take 1 tablet by mouth daily. 11/30/19   Elgergawy, Silver Huguenin, MD  nadolol (CORGARD) 40 MG tablet Take 1.5 tablets (60 mg total) by mouth daily. 08/08/20   Roxan Hockey, MD  naltrexone (DEPADE) 50 MG tablet Take 1 tablet p.o. daily and on every 6th day take 2 tablets. 04/04/20   Lovena Le, Malena M, DO  ondansetron (ZOFRAN-ODT) 4 MG disintegrating tablet DISSOLVE 1 TABLET ON THE TONGUE EVERY 8 HOURS AS NEEDED FOR NAUSEA Patient taking differently: Take 4 mg by mouth every 8 (eight) hours as needed for nausea or vomiting.  01/20/20   Annitta Needs, NP  PARoxetine (PAXIL) 20 MG tablet Take 1 tablet (20 mg total) by mouth daily. 08/08/20   Roxan Hockey, MD  potassium chloride SA (KLOR-CON) 20 MEQ tablet Take 1 tablet (20 mEq total) by mouth daily as needed (Take when taking Lasix). 08/08/20   Roxan Hockey, MD  rifaximin (XIFAXAN) 550 MG TABS tablet Take 1 tablet (550 mg total) by mouth 2 (two) times daily. 11/10/20   Carlis Stable, NP  sildenafil (VIAGRA) 100 MG tablet TAKE 1/2 TABLET BY MOUTH EVERY DAY Patient taking differently: Take 50 mg by mouth as needed for erectile dysfunction.  03/29/20   [name], MD  sitaGLIPtin (JANUVIA) 50 MG tablet Take 1 tablet (50 mg total) by mouth daily. 10/25/20   Lovena Le, Malena M, DO  sucralfate (CARAFATE) 1 g tablet TAKE 1 TABLET BY MOUTH BEFORE MEALS 10/25/20   Lovena Le, Malena M, DO  traZODone (DESYREL) 50 MG tablet TAKE 1 TABLET BY MOUTH AT BEDTIME AS NEEDED FOR INSOMNIA 08/18/20   Lovena Le, Malena M, DO  venlafaxine (EFFEXOR) 25 MG tablet Take 25 mg by mouth 2 (two) times daily. Pt isn't aware of the dosage    [provider]    Current Facility-Administered Medications  Medication Dose Route  Frequency Provider Last Rate Last Admin  . 0.9 %  sodium chloride infusion   Intravenous Continuous Cardama, Grayce Sessions, MD 125 mL/hr at 11/15/20 0844 Restarted at 11/15/20 0844  . [START ON 33/07/2504] folic acid  (FOLVITE) tablet 1 mg  1 mg Oral Daily Jennelle Human B, NP      . insulin aspart (novoLOG) injection 0-9 Units  0-9 Units Subcutaneous Q4H Jennelle Human B, NP      . LORazepam (ATIVAN) injection 0-4 mg  0-4 mg Intravenous Q6H Jennelle Human B, NP       Followed by  . [START ON 11/17/2020] LORazepam (ATIVAN) injection 0-4 mg  0-4 mg Intravenous Q8H Arnell Asal, NP      . Derrill Memo ON 11/16/2020] multivitamin with minerals tablet 1 tablet  1 tablet Oral Daily Jennelle Human B, NP      . octreotide (SANDOSTATIN) 500 mcg in sodium chloride 0.9 % 250 mL (2 mcg/mL) infusion  50 mcg/hr Intravenous Continuous Fatima Blank, MD 25 mL/hr at 11/15/20 0650 50 mcg/hr at 11/15/20 0650  . thiamine tablet 100 mg  100 mg Oral Daily Jennelle Human B, NP       Or  . thiamine (B-1) injection 100 mg  100 mg Intravenous Daily Arnell Asal, NP       Current Outpatient Medications  Medication Sig Dispense Refill  . allopurinol (ZYLOPRIM) 100 MG tablet TAKE 1 TABLET BY MOUTH EVERY DAY (Patient taking differently: Take 100 mg by mouth daily. ) 30 tablet 5  . b complex vitamins tablet Take 1 tablet by mouth daily.    . blood glucose meter kit and supplies Dispense based on patient and insurance preference. Test blood sugar once daily.  (FOR ICD-10 E10.9, E11.9). 1 each 5  . Cholecalciferol (D3-1000) 25 MCG (1000 UT) capsule Take 1,000 Units by mouth daily.    Marland Kitchen dexlansoprazole (DEXILANT) 60 MG capsule Take 1 capsule (60 mg total) by mouth daily. 30 capsule 3  . ferrous sulfate 325 (65 FE) MG EC tablet Take 1 tablet (325 mg total) by mouth 2 (two) times daily. (Patient not taking: Reported on 08/05/2020) 60 tablet 3  . ferrous sulfate 325 (65 FE) MG EC tablet Take 1 tablet (325 mg total) by mouth 2 (two) times daily. 60 tablet 3  . folic acid (FOLVITE) 397 MCG tablet Take 400 mcg by mouth daily.    . furosemide (LASIX) 40 MG tablet Take 0.5 tablets (20 mg total) by mouth daily. Take Daily for 5 Days, Then  Take Daily Only As Needed for Edema 30 tablet 11  . lactulose, encephalopathy, (CHRONULAC) 10 GM/15ML SOLN Take 15-30cc 1-2 daily to Titrate for 3-4 soft bowel movements a day 1892 mL 11  . LORazepam (ATIVAN) 1 MG tablet Take 1 mg by mouth as needed for anxiety.    . Multiple Vitamin (MULTIVITAMIN WITH MINERALS) TABS tablet Take 1 tablet by mouth daily.    . nadolol (CORGARD) 40 MG tablet Take 1.5 tablets (60 mg total) by mouth daily. 135 tablet 3  . naltrexone (DEPADE) 50 MG tablet Take 1 tablet p.o. daily and on every 6th day take 2 tablets. 35 tablet 3  . ondansetron (ZOFRAN-ODT) 4 MG disintegrating tablet DISSOLVE 1 TABLET ON THE TONGUE EVERY 8 HOURS AS NEEDED FOR NAUSEA (Patient taking differently: Take 4 mg by mouth every 8 (eight) hours as needed for nausea or vomiting. ) 24 tablet 2  . PARoxetine (PAXIL) 20 MG tablet Take 1 tablet (20  mg total) by mouth daily. 30 tablet 3  . potassium chloride SA (KLOR-CON) 20 MEQ tablet Take 1 tablet (20 mEq total) by mouth daily as needed (Take when taking Lasix). 30 tablet 11  . rifaximin (XIFAXAN) 550 MG TABS tablet Take 1 tablet (550 mg total) by mouth 2 (two) times daily. 60 tablet 11  . sildenafil (VIAGRA) 100 MG tablet TAKE 1/2 TABLET BY MOUTH EVERY DAY (Patient taking differently: Take 50 mg by mouth as needed for erectile dysfunction. ) 3 tablet 3  . sitaGLIPtin (JANUVIA) 50 MG tablet Take 1 tablet (50 mg total) by mouth daily. 90 tablet 1  . sucralfate (CARAFATE) 1 g tablet TAKE 1 TABLET BY MOUTH BEFORE MEALS 90 tablet 1  . traZODone (DESYREL) 50 MG tablet TAKE 1 TABLET BY MOUTH AT BEDTIME AS NEEDED FOR INSOMNIA 30 tablet 2  . venlafaxine (EFFEXOR) 25 MG tablet Take 25 mg by mouth 2 (two) times daily. Pt isn't aware of the dosage      Allergies as of 11/15/2020  . (No Known Allergies)    Family History  Problem Relation Age of Onset  . Cancer Mother        ? etiology  . Heart disease Mother   . Colon polyps Sister 67       two sisters   . Brain cancer Father   . Liver disease Neg Hx     Social History   Socioeconomic History  . Marital status: Married    Spouse name: Not on file  . Number of children: 2  . Years of education: 74  . Highest education level: High school graduate  Occupational History  . 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 Smoking status: Current Every Day Smoker    Packs/day: 1.00    Years: 30.00    Pack years: 30.00    Types: Cigarettes    Start date: 12/24/1979  . Smokeless tobacco: Never Used  Vaping Use  . Vaping Use: Never used  Substance and Sexual Activity  . Alcohol use: Yes    Alcohol/week: 0.0 standard drinks    Comment: on 11/10/20: still drinking "4-5 glasses of wine a day"  . Drug use: No  . Sexual activity: Yes    Partners: Female    Birth control/protection: None  Other Topics Concern  . Not on file  Social History Narrative   Lives at home with family.   Right-handed.   No daily use of caffeine.   Social Determinants of Health   Financial Resource Strain:   . Difficulty of Paying Living Expenses: Not on file  Food Insecurity:   . Worried About Charity fundraiser in the Last Year: Not on file  . Ran Out of Food in the Last Year: Not on file  Transportation Needs:   . Lack of Transportation (Medical): Not on file  . Lack of Transportation (Non-Medical): Not on file  Physical Activity:   . Days of Exercise per Week: Not on file  . Minutes of Exercise per Session: Not on file  Stress:   . Feeling of Stress : Not on file  Social Connections:   . Frequency of Communication with Friends and Family: Not on file  . Frequency of Social Gatherings with Friends and Family: Not on file  . Attends Religious Services: Not on file  . Active Member of Clubs or Organizations: Not on file  . Attends Archivist Meetings: Not  on file  . Marital Status: Not on file  Intimate Partner Violence:   . Fear of Current or  Ex-Partner: Not on file  . Emotionally Abused: Not on file  . Physically Abused: Not on file  . Sexually Abused: Not on file    Review of Systems: All systems reviewed and negative except where noted in HPI.    OBJECTIVE:    Physical Exam: Vital signs in last 24 hours: Temp:  [97.9 F (36.6 C)-98.3 F (36.8 C)] 98.3 F (36.8 C) (12/07 0730) Pulse Rate:  [102-128] 123 (12/07 0827) Resp:  [9-26] 13 (12/07 0730) BP: (116-151)/(70-84) 140/81 (12/07 0827) SpO2:  [91 %-95 %] 92 % (12/07 0730)   General:   Lethargic white male  Eyes:  Pupils equal,    Conjunctiva pink. Ears:  Normal auditory acuity. Nose:  No deformity, discharge,  or lesions. Neck:  Supple; no masses Lungs:  Clear throughout to auscultation.   No wheezes, crackles, or rhonchi.  Heart:  Sinus tachycardia,  no lower extremity edema Abdomen:  Soft, non-distended, nontender, BS active, no palp mass   Rectal:  Deferred  Msk:  Symmetrical without gross deformities. . Neurologic:  Mild asterixis. Arousable and oriented to person, place and time Skin:  Intact without significant lesions or rashes.  There were no vitals filed for this visit.   Scheduled inpatient medications . [START ON 37/08/4326] folic acid  1 mg Oral Daily  . insulin aspart  0-9 Units Subcutaneous Q4H  . LORazepam  0-4 mg Intravenous Q6H   Followed by  . [START ON 11/17/2020] LORazepam  0-4 mg Intravenous Q8H  . [START ON 11/16/2020] multivitamin with minerals  1 tablet Oral Daily  . thiamine  100 mg Oral Daily   Or  . thiamine  100 mg Intravenous Daily      Intake/Output from previous day: 12/06 0701 - 12/07 0700 In: 100 [IV Piggyback:100] Out: 600 [Emesis/NG output:600] Intake/Output this shift: Total I/O In: 1000 [IV Piggyback:1000] Out: -    Lab Results: Recent Labs    11/15/20 0500  WBC 7.5  HGB 13.2  HCT 39.1  PLT PLATELET CLUMPS NOTED ON SMEAR, UNABLE TO ESTIMATE   BMET Recent Labs    11/15/20 0500  NA 140  K 4.5   CL 104  CO2 27  GLUCOSE 258*  BUN 12  CREATININE 0.90  CALCIUM 8.3*   LFT Recent Labs    11/15/20 0500  PROT 6.2*  ALBUMIN 2.5*  AST 38  ALT 18  ALKPHOS 111  BILITOT 2.6*   PT/INR No results for input(s): LABPROT, INR in the last 72 hours. Hepatitis Panel No results for input(s): HEPBSAG, HCVAB, HEPAIGM, HEPBIGM in the last 72 hours.   . CBC Latest Ref Rng & Units 11/15/2020 10/24/2020 08/08/2020  WBC 4.0 - 10.5 K/uL 7.5 4.6 3.0(L)  Hemoglobin 13.0 - 17.0 g/dL 13.2 12.5(L) 10.6(L)  Hematocrit 39 - 52 % 39.1 36.4(L) 32.2(L)  Platelets 150 - 400 K/uL PLATELET CLUMPS NOTED ON SMEAR, UNABLE TO ESTIMATE 84(LL) 53(L)    . CMP Latest Ref Rng & Units 11/15/2020 10/24/2020 08/08/2020  Glucose 70 - 99 mg/dL 258(H) 350(H) 94  BUN 6 - 20 mg/dL 12 11 <5(L)  Creatinine 0.61 - 1.24 mg/dL 0.90 0.86 0.49(L)  Sodium 135 - 145 mmol/L 140 137 141  Potassium 3.5 - 5.1 mmol/L 4.5 3.9 3.3(L)  Chloride 98 - 111 mmol/L 104 100 107  CO2 22 - 32 mmol/L 27 22 27   Calcium 8.9 -  10.3 mg/dL 8.3(L) 8.6(L) 7.9(L)  Total Protein 6.5 - 8.1 g/dL 6.2(L) 6.6 5.1(L)  Total Bilirubin 0.3 - 1.2 mg/dL 2.6(H) 1.4(H) 2.9(H)  Alkaline Phos 38 - 126 U/L 111 113 133(H)  AST 15 - 41 U/L 38 41(H) 87(H)  ALT 0 - 44 U/L 18 20 26    Studies/Results: DG Chest Port 1 View  Result Date: 11/15/2020 CLINICAL DATA:  Hematemesis since last evening. EXAM: PORTABLE CHEST 1 VIEW COMPARISON:  Chest x-ray 08/05/2020 FINDINGS: The cardiac silhouette, mediastinal hilar contours are within normal limits. The lungs are clear. No pleural effusions. No pulmonary lesions. No pneumothorax. The bony thorax is intact. IMPRESSION: No acute cardiopulmonary findings. Electronically Signed   By: Marijo Sanes M.D.   On: 11/15/2020 06:46    Active Problems:   Hematemesis    Tye Savoy, NP-C @  11/15/2020, 8:58 AM

## 2020-11-15 NOTE — Progress Notes (Signed)
pocus performed with challenging views 2/2 body habitus and hepato/splenomegaly.   ? Small effusion noted. And hypotensive on vasopressor. Given 1L bolus with improvement in BP. Called for formal echo, read pending to eval ?effusion with tamponade.   If ruptured varices or active bleeding would not want chest tube placed for potentially expanding hemothorax in hopes of the bleeding tamponading off.   D/w IR and provided update.   He is awaiting transport to IR for eval of gi bleed and potentially this new finding as well.

## 2020-11-15 NOTE — Progress Notes (Signed)
   All taken this am from Dr Mamie Nick Cardama Pt admitted with hematemesis - 5-6 x since last night 7 pm Small amounts of 15 cc per time-- per chart Hemodynamically stable PMH of alcoholic cirrhosis with esophageal /gastric varices- post TIPS/BRTO 11/24/2019  IR consulted for review of images and recommendations  Dr Serafina Royals has reviewed imaging and is recommending US Liver for patency of TIPs  Will discuss with admitting MD PCCM

## 2020-11-15 NOTE — ED Triage Notes (Signed)
Patient with vomiting blood since 7pm last night.  Patient feeling weak, states that it is due to drinking.  Last drink was from last night.  Patient with blood residue on lips.  No chest pain, no abdominal pain at this time.

## 2020-11-15 NOTE — Progress Notes (Signed)
Pt arrived to 70M 06 from ENDO. Upon arrival pt with 7.5 ETT in place 21cm at the lip. CRNA asked where tube placement should be and this RT was told 20-23cm maybe 24. CRNA then pushed ETT back in to 24cm. RT will secure at 23cm as previously documented and obtain a chest xray for placement verification.

## 2020-11-15 NOTE — ED Notes (Signed)
Attempted to call report

## 2020-11-15 NOTE — Progress Notes (Signed)
CRITICAL VALUE ALERT  Critical Value: Possible Evolving Left Sided Hemothorax  Date & Time Notied:  11/15/20 @ 4656  Provider Notified: Dr. Suzette Battiest, via page [phone]   Orders Received/Actions taken: Awaiting call back.

## 2020-11-15 NOTE — Anesthesia Postprocedure Evaluation (Signed)
Anesthesia Post Note  Patient: Bradley Miles  Procedure(s) Performed: ESOPHAGOGASTRODUODENOSCOPY (EGD) WITH PROPOFOL (N/A )     Patient location during evaluation: SICU Anesthesia Type: General Level of consciousness: sedated Pain management: pain level controlled Vital Signs Assessment: post-procedure vital signs reviewed and stable Respiratory status: patient remains intubated per anesthesia plan Cardiovascular status: stable Postop Assessment: no apparent nausea or vomiting Anesthetic complications: no Comments: GI unable to successfully cauterize bleeding source. Transferred to ICU on full monitors with propofol sedation and vasopressin support with plans for IR guided embolization.    No complications documented.  Last Vitals:  Vitals:   11/15/20 1135 11/15/20 1208  BP:    Pulse:    Resp:    Temp:    SpO2: 99% 98%    Last Pain:  Vitals:   11/15/20 1032  TempSrc: Temporal  PainSc: 0-No pain   Pain Goal:                   March Rummage Gloriann Riede

## 2020-11-15 NOTE — Progress Notes (Signed)
Pt transported from IR to 3M06 on the ventilator. RT, CRNA and RN accompanied pt. VSS throughout. RT will continue to monitor.

## 2020-11-15 NOTE — ED Notes (Signed)
IV team at bedside at this time. Feels they will be unsuccessful at attempt, will call for ultrasound IV attempt or other procedure as needed

## 2020-11-15 NOTE — Procedures (Signed)
Interventional Radiology Procedure Note  Procedure:  1) Ultrasound and fluoroscopic guided central line placement 2) Retrograde cannulation of gastro-renal shunt 3) Embolization of posterior bifurcation of gastro-renal shunt 4) Balloon and coil assisted retrograde transvenous obliteration of gastric varices  Findings: Please refer to procedural dictation for full description.  Successful transvenous obliteration of recurrent gastric varices with lipiodol, sotradecol, and air admixture.  Coil embolization of bifurcated gastro-renal shunt.  Complications: None immediate  Estimated Blood Loss: <20 mL  Recommendations: Agree with continued close hemodynamic monitoring, blood transfusions as needed.   IR will continue to follow.  Ruthann Cancer, MD Pager: [phone]

## 2020-11-15 NOTE — Anesthesia Preprocedure Evaluation (Addendum)
Anesthesia Evaluation  Patient identified by MRN, date of birth, ID band Patient awake    Reviewed: Patient's Chart, lab work & pertinent test results  Airway Mallampati: II  TM Distance: >3 FB Neck ROM: Full    Dental  (+) Teeth Intact   Pulmonary COPD, Current Smoker and Patient abstained from smoking.,    Pulmonary exam normal        Cardiovascular hypertension, Pt. on medications and Pt. on home beta blockers  Rhythm:Regular Rate:Normal     Neuro/Psych  Headaches, Anxiety    GI/Hepatic GERD  ,(+) Cirrhosis   Esophageal Varices and ascites    , Hepatitis -  Endo/Other  diabetes (196)Morbid obesity  Renal/GU      Musculoskeletal   Abdominal (+)  Abdomen: soft. Bowel sounds: normal.  Peds  Hematology   Anesthesia Other Findings   Reproductive/Obstetrics                            Anesthesia Physical Anesthesia Plan  ASA: IV  Anesthesia Plan: General   Post-op Pain Management:    Induction: Rapid sequence and Intravenous  PONV Risk Score and Plan: 1 and Ondansetron and Dexamethasone  Airway Management Planned: Mask and Oral ETT  Additional Equipment: None  Intra-op Plan:   Post-operative Plan: Extubation in OR  Informed Consent: I have reviewed the patients History and Physical, chart, labs and discussed the procedure including the risks, benefits and alternatives for the proposed anesthesia with the patient or authorized representative who has indicated his/her understanding and acceptance.     Dental advisory given  Plan Discussed with: CRNA  Anesthesia Plan Comments: (Lab Results      Component                Value               Date                      WBC                      7.5                 11/15/2020                HGB                      11.3 (L)            11/15/2020                HCT                      35.4 (L)            11/15/2020                 MCV                      108.9 (H)           11/15/2020                PLT                                          11/15/2020  PLATELET CLUMPS NOTED ON SMEAR, UNABLE TO ESTIMATE Lab Results      Component                Value               Date                      NA                       140                 11/15/2020                K                        4.5                 11/15/2020                CO2                      27                  11/15/2020                GLUCOSE                  258 (H)             11/15/2020                BUN                      12                  11/15/2020                CREATININE               0.90                11/15/2020                CALCIUM                  8.3 (L)             11/15/2020                GFRNONAA                 >60                 11/15/2020                GFRAA                    116                 10/24/2020           FS DOS 190)        Anesthesia Quick Evaluation

## 2020-11-15 NOTE — Anesthesia Procedure Notes (Signed)
Arterial Line Insertion Start/End12/06/2020 11:12 AM, 11/15/2020 11:17 AM Performed by: Verdie Drown, CRNA, CRNA  Preanesthetic checklist: patient identified, IV checked, risks and benefits discussed, surgical consent, monitors and equipment checked and pre-op evaluation Right, radial was placed Catheter size: 20 G Hand hygiene performed  and maximum sterile barriers used   Attempts: 1 Procedure performed without using ultrasound guided technique. Ultrasound Notes:anatomy identified Following insertion, dressing applied and Biopatch.

## 2020-11-15 NOTE — Transfer of Care (Signed)
Immediate Anesthesia Transfer of Care Note  Patient: Bradley Miles  Procedure(s) Performed: IR WITH ANESTHESIA (N/A )  Patient Location: ICU  Anesthesia Type:General  Level of Consciousness: Patient remains intubated per anesthesia plan  Airway & Oxygen Therapy: Patient remains intubated per anesthesia plan and Patient placed on Ventilator (see vital sign flow sheet for setting)  Post-op Assessment: Report given to RN and Post -op Vital signs reviewed and stable  Post vital signs: Reviewed and stable  Last Vitals:  Vitals Value Taken Time  BP 142/72 11/15/20 1900  Temp    Pulse 98 11/15/20 1908  Resp 20 11/15/20 1908  SpO2 100 % 11/15/20 1908  Vitals shown include unvalidated device data.  Last Pain:  Vitals:   11/15/20 1032  TempSrc: Temporal  PainSc: 0-No pain         Complications: No complications documented.

## 2020-11-15 NOTE — Consult Note (Addendum)
Chief Complaint: Patient was seen in consultation today for  Chief Complaint  Patient presents with  . Hematemesis    Referring Physician(s): Dr. Fuller Plan  Supervising Physician: Dr. Serafina Royals  Patient Status: [facility] - In-pt  History of Present Illness: Bradley Miles is a 54 y.o. male with a medical history significant for anxiety, DM2, HTN, cirrhosis, portal hypertension and prior variceal hemorrhage. He is status post BRTO and TIPS December 2020. He was recently treated at a rehab facility in [place] for ETOH use.  He was admitted to [facility] August 2021 for recurrent hematemesis secondary to ongoing ETOH use. Upper endoscopy at that time revealed no bleeding varices; most likely source was portal gastropathy. He was recently seen by Dr. Earleen Newport at the [place] [facility] (virtual visit) and a duplex done 10/24/20 showed the TIPS to be patent. The patient stated he continues to consume alcohol.  [name] presented to the [facility] today (11/15/20) with hematemesis, confusion and reports of drinking alcohol last night. CT imaging obtained showed the TIPS to be patent.  CT Angio Abdomen/Pelvis 11/15/20: IMPRESSION: VASCULAR 1. Stable appearance of the TIPS stent. The stent appears to be patent based on the venous phase imaging but this could be better characterized with a dedicated liver duplex ultrasound. 2. Multiple variceal embolization coils. However, there is evidence for residual varices at the gastric cardia region. Despite having had a BRTO procedure, there appears to be residual flow in a gastro renal shunt. Gastro renal shunt may be supplying the gastric cardia varices. 3. No evidence for active arterial bleeding. NON-VASCULAR 1. Cirrhosis with a small amount of ascites. Spleen is prominent for size. 2. Diffuse wall thickening in the colon, most prominent in the rectum and distal sigmoid colon region. Findings are concerning for colitis. Etiology is uncertain but this  does represent a change since 08/05/2020. 3. Cholelithiasis  The patient was taken to endoscopy today for an EGD. Dr. Fuller Plan found no esophageal varices but found a large volume of clot with active bleeding in the stomach but the source of bleeding was not located.  Interventional Radiology has been asked to emergently evaluate this patient for a possible TIPS revision; possible retrograde evaluation of esophageal varices with possible obliteration; possible angiogram with intervention. This case has been reviewed and procedure approved by Dr. Serafina Royals.   Past Medical History:  Diagnosis Date  . Anxiety   . Controlled type 2 diabetes mellitus with complication, without long-term current use of insulin ([place]) 06/26/2020  . Enlarged liver   . GERD (gastroesophageal reflux disease)   . Hypertension   . Palpitations   . PVC's (premature ventricular contractions)   . Shortness of breath   . Tussive syncope 12/22/2019  . Varicose veins     Past Surgical History:  Procedure Laterality Date  . BIOPSY  02/02/2019   Procedure: BIOPSY;  Surgeon: Daneil Dolin, MD;  [facility];  Service: Endoscopy;;  gastric  . COLONOSCOPY WITH ESOPHAGOGASTRODUODENOSCOPY (EGD)  12/16/2012   internal hemorrhoids, colonic diverticulosis, benign polyps, screening in 2024. EGD with mild erosive reflux esophagitis, small hiatal hernia, negative H.pylori  . cyst reomved     from spine  . ESOPHAGOGASTRODUODENOSCOPY  05/19/09   RMR: Geographic distal esophageal erosions consistent with severe erosive reflux esophagitis. schatzki's ring s/P dilation/small hiatal hernia otherwise normal stomach  . ESOPHAGOGASTRODUODENOSCOPY (EGD) WITH PROPOFOL N/A 02/02/2019   Dr. Gala Romney: retained gastric contents. portal HTN gastropathy  . ESOPHAGOGASTRODUODENOSCOPY (EGD) WITH PROPOFOL N/A 11/20/2019  Procedure: ESOPHAGOGASTRODUODENOSCOPY (EGD) WITH PROPOFOL;  Surgeon: Daneil Dolin, MD;  [facility];  Service:  Endoscopy;  Laterality: N/A;  . ESOPHAGOGASTRODUODENOSCOPY (EGD) WITH PROPOFOL N/A 11/24/2019   Procedure: ESOPHAGOGASTRODUODENOSCOPY (EGD) WITH PROPOFOL;  Surgeon: Ronnette Juniper, MD;  Location: [place];  Service: Gastroenterology;  Laterality: N/A;  . ESOPHAGOGASTRODUODENOSCOPY (EGD) WITH PROPOFOL N/A 08/08/2020   Procedure: ESOPHAGOGASTRODUODENOSCOPY (EGD) WITH PROPOFOL;  Surgeon: Daneil Dolin, MD;  [facility];  Service: Endoscopy;  Laterality: N/A;  . IR ANGIOGRAM SELECTIVE EACH ADDITIONAL VESSEL  11/22/2019  . IR ANGIOGRAM SELECTIVE EACH ADDITIONAL VESSEL  11/24/2019  . IR ANGIOGRAM SELECTIVE EACH ADDITIONAL VESSEL  11/24/2019  . IR ANGIOGRAM SELECTIVE EACH ADDITIONAL VESSEL  11/24/2019  . IR EMBO ART  VEN HEMORR LYMPH EXTRAV  INC GUIDE ROADMAPPING  11/22/2019  . IR EMBO ART  VEN HEMORR LYMPH EXTRAV  INC GUIDE ROADMAPPING  11/24/2019  . IR RADIOLOGIST EVAL & MGMT  12/29/2019  . IR RADIOLOGIST EVAL & MGMT  10/27/2020  . IR TIPS  11/24/2019  . IR US GUIDE VASC ACCESS RIGHT  11/22/2019  . IR VENOGRAM RENAL UNI LEFT  11/22/2019  . RADIOLOGY WITH ANESTHESIA N/A 11/24/2019   Procedure: IR WITH ANESTHESIA;  Surgeon: Radiologist, Medication, MD;  Location: [place];  Service: Radiology;  Laterality: N/A;  . TIPS PROCEDURE N/A 11/22/2019   Procedure: BRTO/TRANS-JUGULAR INTRAHEPATIC PORTAL SHUNT (TIPS);  Surgeon: Corrie Mckusick, DO;  Location: [place];  Service: Anesthesiology;  Laterality: N/A;  . WISDOM TOOTH EXTRACTION      Allergies: Patient has no known allergies.  Medications: Prior to Admission medications   Medication Sig Start Date End Date Taking? Authorizing Provider  allopurinol (ZYLOPRIM) 100 MG tablet TAKE 1 TABLET BY MOUTH EVERY DAY Patient taking differently: Take 100 mg by mouth daily.  12/10/19   [name], MD  b complex vitamins tablet Take 1 tablet by mouth daily.    [provider]  blood glucose meter kit and supplies Dispense based on  patient and insurance preference. Test blood sugar once daily.  (FOR ICD-10 E10.9, E11.9). 04/21/20   Erven Colla, DO  Cholecalciferol (D3-1000) 25 MCG (1000 UT) capsule Take 1,000 Units by mouth daily.    [provider]  dexlansoprazole (DEXILANT) 60 MG capsule Take 1 capsule (60 mg total) by mouth daily. 08/08/20   Roxan Hockey, MD  ferrous sulfate 325 (65 FE) MG EC tablet Take 1 tablet (325 mg total) by mouth 2 (two) times daily. Patient not taking: Reported on 08/05/2020 11/29/19 06/21/20  Elgergawy, Silver Huguenin, MD  ferrous sulfate 325 (65 FE) MG EC tablet Take 1 tablet (325 mg total) by mouth 2 (two) times daily. 08/08/20 09/07/20  Roxan Hockey, MD  folic acid (FOLVITE) 785 MCG tablet Take 400 mcg by mouth daily.    [provider]  furosemide (LASIX) 40 MG tablet Take 0.5 tablets (20 mg total) by mouth daily. Take Daily for 5 Days, Then Take Daily Only As Needed for Edema 08/08/20   Roxan Hockey, MD  lactulose, encephalopathy, (CHRONULAC) 10 GM/15ML SOLN Take 15-30cc 1-2 daily to Titrate for 3-4 soft bowel movements a day 11/10/20   Carlis Stable, NP  LORazepam (ATIVAN) 1 MG tablet Take 1 mg by mouth as needed for anxiety.    [provider]  Multiple Vitamin (MULTIVITAMIN WITH MINERALS) TABS tablet Take 1 tablet by mouth daily. 11/30/19   Elgergawy, Silver Huguenin, MD  nadolol (CORGARD) 40 MG tablet Take  1.5 tablets (60 mg total) by mouth daily. 08/08/20   Roxan Hockey, MD  naltrexone (DEPADE) 50 MG tablet Take 1 tablet p.o. daily and on every 6th day take 2 tablets. 04/04/20   Lovena Le, Malena M, DO  ondansetron (ZOFRAN-ODT) 4 MG disintegrating tablet DISSOLVE 1 TABLET ON THE TONGUE EVERY 8 HOURS AS NEEDED FOR NAUSEA Patient taking differently: Take 4 mg by mouth every 8 (eight) hours as needed for nausea or vomiting.  01/20/20   Annitta Needs, NP  PARoxetine (PAXIL) 20 MG tablet Take 1 tablet (20 mg total) by mouth daily. 08/08/20   Roxan Hockey, MD  potassium  chloride SA (KLOR-CON) 20 MEQ tablet Take 1 tablet (20 mEq total) by mouth daily as needed (Take when taking Lasix). 08/08/20   Roxan Hockey, MD  rifaximin (XIFAXAN) 550 MG TABS tablet Take 1 tablet (550 mg total) by mouth 2 (two) times daily. 11/10/20   Carlis Stable, NP  sildenafil (VIAGRA) 100 MG tablet TAKE 1/2 TABLET BY MOUTH EVERY DAY Patient taking differently: Take 50 mg by mouth as needed for erectile dysfunction.  03/29/20   [name], MD  sitaGLIPtin (JANUVIA) 50 MG tablet Take 1 tablet (50 mg total) by mouth daily. 10/25/20   Lovena Le, Malena M, DO  sucralfate (CARAFATE) 1 g tablet TAKE 1 TABLET BY MOUTH BEFORE MEALS 10/25/20   Lovena Le, Malena M, DO  traZODone (DESYREL) 50 MG tablet TAKE 1 TABLET BY MOUTH AT BEDTIME AS NEEDED FOR INSOMNIA 08/18/20   Lovena Le, Malena M, DO  venlafaxine (EFFEXOR) 25 MG tablet Take 25 mg by mouth 2 (two) times daily. Pt isn't aware of the dosage    [provider]     Family History  Problem Relation Age of Onset  . Cancer Mother        ? etiology  . Heart disease Mother   . Colon polyps Sister 69       two sisters  . Brain cancer Father   . Liver disease Neg Hx     Social History   Socioeconomic History  . Marital status: Married    Spouse name: Not on file  . Number of children: 2  . Years of education: 59  . Highest education level: High school graduate  Occupational History  . 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 Smoking status: Current Every Day Smoker    Packs/day: 1.00    Years: 30.00    Pack years: 30.00    Types: Cigarettes    Start date: 12/24/1979  . Smokeless tobacco: Never Used  Vaping Use  . Vaping Use: Never used  Substance and Sexual Activity  . Alcohol use: Yes    Alcohol/week: 0.0 standard drinks    Comment: on 11/10/20: still drinking "4-5 glasses of wine a day"  . Drug use: No  . Sexual activity: Yes    Partners: Female    Birth control/protection: None   Other Topics Concern  . Not on file  Social History Narrative   Lives at home with family.   Right-handed.   No daily use of caffeine.   Social Determinants of Health   Financial Resource Strain:   . Difficulty of Paying Living Expenses: Not on file  Food Insecurity:   . Worried About Charity fundraiser in the Last Year: Not on file  . Ran Out of Food in the Last Year: Not on file  Transportation Needs:   .  Lack of Transportation (Medical): Not on file  . Lack of Transportation (Non-Medical): Not on file  Physical Activity:   . Days of Exercise per Week: Not on file  . Minutes of Exercise per Session: Not on file  Stress:   . Feeling of Stress : Not on file  Social Connections:   . Frequency of Communication with Friends and Family: Not on file  . Frequency of Social Gatherings with Friends and Family: Not on file  . Attends Religious Services: Not on file  . Active Member of Clubs or Organizations: Not on file  . Attends Archivist Meetings: Not on file  . Marital Status: Not on file    Review of Systems: A 12 point ROS discussed and pertinent positives are indicated in the HPI above.  All other systems are negative.  Review of Systems  Unable to perform ROS: Acuity of condition    Vital Signs: BP (!) 152/88   Pulse (!) 102   Temp 98.1 F (36.7 C) (Temporal)   Resp 18   SpO2 99%   Physical Exam Constitutional:      General: He is in acute distress.     Appearance: He is ill-appearing.  HENT:     Mouth/Throat:     Comments: Frank blood pooled in mouth, trickling out the side of his mouth requiring suctioning.  Cardiovascular:     Rate and Rhythm: Tachycardia present. Rhythm irregular.     Pulses: Normal pulses.     Heart sounds: Normal heart sounds.  Pulmonary:     Effort: Pulmonary effort is normal.     Breath sounds: Normal breath sounds.     Comments: ETT/Ventilator. 100% FiO2, 5 PEEP Abdominal:     General: Abdomen is protuberant. Bowel  sounds are normal.  Musculoskeletal:     Right lower leg: Edema present.     Left lower leg: Edema present.  Skin:    General: Skin is warm and dry.    Imaging: DG Chest Port 1 View  Result Date: 11/15/2020 CLINICAL DATA:  Hematemesis since last evening. EXAM: PORTABLE CHEST 1 VIEW COMPARISON:  Chest x-ray 08/05/2020 FINDINGS: The cardiac silhouette, mediastinal hilar contours are within normal limits. The lungs are clear. No pleural effusions. No pulmonary lesions. No pneumothorax. The bony thorax is intact. IMPRESSION: No acute cardiopulmonary findings. Electronically Signed   By: Marijo Sanes M.D.   On: 11/15/2020 06:46   US ABDOMINAL PELVIC ART/VENT FLOW DOPPLER  Addendum Date: 10/24/2020   ADDENDUM REPORT: 10/24/2020 15:08 ADDENDUM: Small volume ascites and signs of liver disease/cirrhosis as before. Electronically Signed   By: Zetta Bills M.D.   On: 10/24/2020 15:08   Result Date: 10/24/2020 CLINICAL DATA:  Tips, bleeding esophageal varices follow-up evaluation. EXAM: DUPLEX ULTRASOUND OF LIVER AND TIPS SHUNT TECHNIQUE: Color and duplex Doppler ultrasound was performed to evaluate the hepatic in-flow and out-flow vessels. COMPARISON:  Comparison study from August 05, 2020 FINDINGS: Portal Vein Velocities Main:  32 cm/sec Right:  123 cm/sec Left:  18 cm/sec TIPS Stent Velocities Proximal:  123 cm/sec Mid:  163 Distal:  117 cm/sec IVC: IVC is patent. Hepatic Vein Velocities Right:  117 cm/sec Mid:  14 cm/sec Left:  13 cm/sec Splenic Vein: Patent, 17 centimeters/second. Superior Mesenteric Vein: Not visualized Hepatic Artery: 197 cm per second Ascities: Small amount of ascites. Varices: Not seen Other findings: None IMPRESSION: 1. Tips is patent with antegrade flow. Flow velocities are similar to the prior exam. 2. Portal vein is  patent. 3. Hepatofugal flow in the LEFT portal vein. 4. Elevated velocities in the hepatic artery, not as elevated as on the previous ultrasound evaluation may be  in part artifactual. The waveforms are normal. Electronically Signed: By: Zetta Bills M.D. On: 10/24/2020 14:56   [place] EKG SITE RITE  Result Date: 11/15/2020 If Site Rite image not attached, placement could not be confirmed due to current cardiac rhythm.  IR Radiologist Eval & Mgmt  Result Date: 10/27/2020 Please refer to notes tab for details about interventional procedure. (Op Note)  CT Angio Abd/Pel w/ and/or w/o  Result Date: 11/15/2020 CLINICAL DATA:  54 year old with cirrhosis with prior BTRO and TIPS procedure. Patient presents with hematemesis. EXAM: CTA ABDOMEN AND PELVIS WITHOUT AND WITH CONTRAST TECHNIQUE: Multidetector CT imaging of the abdomen and pelvis was performed using the standard protocol during bolus administration of intravenous contrast. Multiplanar reconstructed images and MIPs were obtained and reviewed to evaluate the vascular anatomy. CONTRAST:  179m OMNIPAQUE IOHEXOL 350 MG/ML SOLN COMPARISON:  Ultrasound 10/24/2020 and CTA 08/05/2020 FINDINGS: VASCULAR Aorta: Mild atherosclerotic disease in the abdominal aorta without aneurysm or dissection. Celiac: Celiac trunk is patent. Limited evaluation of the celiac artery branches due to artifact from the embolization coils. However, splenic artery and hepatic arteries are patent. Incidentally, there is a replaced left hepatic artery coming off the left gastric artery. Left gastric artery originates directly from the abdominal aorta. SMA: Patent without evidence of aneurysm, dissection, vasculitis or significant stenosis. Renals: Bilateral renal arteries are widely patent without aneurysm or dissection. Small accessory renal arteries bilaterally. IMA: Patent. Inflow: Atherosclerotic disease involving the bilateral iliac arteries. External, internal and common iliac arteries are patent bilaterally. Proximal Outflow: Proximal femoral arteries are patent bilaterally. Veins: TIPS stent has a stable configuration and position. There  appears to be flow within the stent on the delayed images but this could be better characterized with ultrasound. Multiple embolization coils in the upper abdomen related to variceal embolization. Evidence for varices near the gastric cardia best seen on sequence 7, image 21. Although the patient has had a BRTO procedure, the gastro renal shunt is still patent near the renal vein. No significant varices in the distal esophageal region. IVC and renal veins are patent. Iliac veins are patent. Review of the MIP images confirms the above findings. NON-VASCULAR Lower chest: Dependent densities in both lower lobes are suggestive for atelectasis. No large pleural effusions. Hepatobiliary: Liver has a mildly nodular contour and compatible with cirrhosis. No focal liver lesion. Evidence for stones at the base of the gallbladder without gallbladder distention or inflammatory changes. Small amount of perihepatic ascites. Pancreas: Unremarkable. No pancreatic ductal dilatation or surrounding inflammatory changes. Spleen: Spleen is mildly prominent for size measuring roughly 13.7 cm in length. Small amount of ascites in the left upper quadrant. No focal splenic lesion. Adrenals/Urinary Tract: Normal appearance of the adrenal glands. Normal appearance of both kidneys. No hydronephrosis. No suspicious renal lesions. Normal appearance of the urinary bladder. Stomach/Bowel: Diffuse wall thickening involving the rectum and distal sigmoid colon. Mild wall thickening throughout the colon. No evidence for small bowel dilatation. No evidence for a bowel obstruction. The stomach is distended with air-fluid level. High-density material along the dependent aspect of the stomach could represent blood products. Proximal duodenum is distended. Lymphatic: No significant lymph node enlargement in the abdomen or pelvis. Reproductive: Prostate is unremarkable. Other: Trace pelvic ascites with presacral edema. Small amount of ascites in the  abdomen, mostly around the liver. Mild mesenteric  edema. Musculoskeletal: Bilateral pars defects at L5 without significant anterolisthesis. IMPRESSION: VASCULAR 1. Stable appearance of the TIPS stent. The stent appears to be patent based on the venous phase imaging but this could be better characterized with a dedicated liver duplex ultrasound. 2. Multiple variceal embolization coils. However, there is evidence for residual varices at the gastric cardia region. Despite having had a BRTO procedure, there appears to be residual flow in a gastro renal shunt. Gastro renal shunt may be supplying the gastric cardia varices. 3. No evidence for active arterial bleeding. NON-VASCULAR 1. Cirrhosis with a small amount of ascites. Spleen is prominent for size. 2. Diffuse wall thickening in the colon, most prominent in the rectum and distal sigmoid colon region. Findings are concerning for colitis. Etiology is uncertain but this does represent a change since 08/05/2020. 3. Cholelithiasis. Electronically Signed   By: Markus Daft M.D.   On: 11/15/2020 09:08    Labs:  CBC: Recent Labs    06/20/20 0855 08/07/20 0710 08/07/20 0710 08/08/20 0651 10/24/20 1346 11/15/20 0500 11/15/20 0955 11/15/20 1109  WBC  --  3.7*  --  3.0* 4.6 7.5  --   --   HGB   < > 10.8*   < > 10.6* 12.5* 13.2 11.3* 9.9*  HCT   < > 32.5*   < > 32.2* 36.4* 39.1 35.4* 29.0*  PLT  --  57*  --  53* 84* PLATELET CLUMPS NOTED ON SMEAR, UNABLE TO ESTIMATE  --   --    < > = values in this interval not displayed.    COAGS: Recent Labs    11/19/19 2208 11/22/19 2304 08/05/20 0031 08/05/20 0543 08/08/20 0651 11/15/20 0955  INR 1.3*   < > 1.5* 1.7* 1.3* 1.9*  APTT 41*  --  39*  --   --   --    < > = values in this interval not displayed.    BMP: Recent Labs    08/06/20 0640 08/06/20 0640 08/07/20 0710 08/07/20 0710 08/08/20 0651 10/24/20 1029 11/15/20 0500 11/15/20 1109  NA 141   < > 140   < > 141 137 140 143  K 4.0   < > 3.5    < > 3.3* 3.9 4.5 5.7*  CL 106   < > 105   < > 107 100 104 107  CO2 30   < > 30  --  _0 --   GLUCOSE 124*   < > 100*   < > 94 350* 258* 190*  BUN 10   < > 7   < > <5* _1 CALCIUM 7.6*   < > 7.6*  --  7.9* 8.6* 8.3*  --   CREATININE 0.59*   < > 0.54*   < > 0.49* 0.86 0.90 0.90  GFRNONAA >60   < > >60  --  >60 100 >60  --   GFRAA >60  --  >60  --  >60 116  --   --    < > = values in this interval not displayed.    LIVER FUNCTION TESTS: Recent Labs    08/07/20 0710 08/08/20 0651 10/24/20 1029 11/15/20 0500  BILITOT 3.6* 2.9* 1.4* 2.6*  AST 89* 87* 41* 38  ALT _2 ALKPHOS 134* 133* 113 111  PROT 5.0* 5.1* 6.6 6.2*  ALBUMIN 1.9* 2.0* 3.1* 2.5*    TUMOR MARKERS: No results for input(s): AFPTM, CEA, CA199, CHROMGRNA in  the last 8760 hours.  Assessment and Plan:  Decompensated alcoholic cirrhosis with ongoing alcohol abuse and recurrent UGI bleed with large volume hematemesis: Bradley Miles, 54 year old male, is planned for an emergent IR evaluation to possibly include a transjugular intrahepatic portosystemic shunt revision; retrograde evaluation of esophageal varices with possible obliteration; possible angiogram with intervention. I met with the patient's wife, Bradley Miles, in the ICU and discussed IR's role and possible interventions. The consent was signed by the patient's wife and is on the chart.   Risks and benefits of TIPS revision, BRTO and/or additional variceal embolization and/or angiogram with intervention were discussed with the patient's wife including, but not limited to, infection, bleeding, damage to adjacent structures, worsening hepatic, renal and/or cardiac function, worsening and/or the development of altered mental status/encephalopathy, non-target embolization and death.   This interventional procedure involves the use of X-rays and because of the nature of the planned procedure, it is possible that we will have prolonged use of X-ray  fluoroscopy.  Potential radiation risks to you include (but are not limited to) the following: - A slightly elevated risk for cancer        several years later in life. This risk is typically less than 0.5% percent. This risk is low in comparison to the normal incidence of human cancer, which is 33% for women and 50% for men according to the [place]. - Radiation induced injury can include skin redness, resembling a rash, tissue breakdown / ulcers and hair loss (which can be temporary or permanent).   The likelihood of either of these occurring depends on the difficulty of the procedure and whether you are sensitive to radiation due to previous procedures, disease, or genetic conditions.   IF your procedure requires a prolonged use of radiation, you will be notified and given written instructions for further action.  It is your responsibility to monitor the irradiated area for the 2 weeks following the procedure and to notify your physician if you are concerned that you have suffered a radiation induced injury.    All of the patient's wife's questions were answered and she is agreeable to proceed  Thank you for this interesting consult.  I greatly enjoyed meeting Bradley Miles and look forward to participating in their care.  A copy of this report was sent to the requesting provider on this date.  Electronically Signed: Soyla Dryer, AGACNP-BC [phone] 11/15/2020, 12:17 PM   I spent a total of 40 Minutes    in face to face in clinical consultation, greater than 50% of which was counseling/coordinating care for image-guided TIPS revision; possible retrograde evaluation of esophageal varices with possible obliteration; possible angiogram.

## 2020-11-15 NOTE — Anesthesia Procedure Notes (Signed)
Procedure Name: Intubation Date/Time: 11/15/2020 10:37 AM Performed by: Babs Bertin, CRNA Pre-anesthesia Checklist: Patient identified, Emergency Drugs available, Suction available and Patient being monitored Patient Re-evaluated:Patient Re-evaluated prior to induction Oxygen Delivery Method: Circle System Utilized Preoxygenation: Pre-oxygenation with 100% oxygen Induction Type: IV induction, Rapid sequence and Cricoid Pressure applied Laryngoscope Size: Mac and 3 Grade View: Grade I Tube type: Oral Tube size: 7.5 mm Number of attempts: 1 Airway Equipment and Method: Stylet and Oral airway Placement Confirmation: ETT inserted through vocal cords under direct vision,  positive ETCO2 and breath sounds checked- equal and bilateral Secured at: 23 cm Tube secured with: Tape Dental Injury: Teeth and Oropharynx as per pre-operative assessment

## 2020-11-15 NOTE — Op Note (Signed)
[facility] Patient Name: Bradley Miles Procedure Date : 11/15/2020 MRN: [id number] Attending MD: Ladene Artist , MD Date of Birth: 1966/11/12 CSN: [id number] Age: 53 Admit Type: Inpatient Procedure:                Upper GI endoscopy Indications:              Hematemesis. Cirrhosis. History of gastric and                            esophageal varices. S/P TIPS, coil embolization of                            varices and BRTO. Providers:                Pricilla Riffle. Fuller Plan, MD, Particia Nearing, RN, Tyna Jaksch Technician Referring MD:             CCM Medicines:                General Anesthesia Complications:            No immediate complications. Estimated Blood Loss:     Estimated blood loss: 200 mL Procedure:                Pre-Anesthesia Assessment:                           - Prior to the procedure, a History and Physical                            was performed, and patient medications and                            allergies were reviewed. The patient's tolerance of                            previous anesthesia was also reviewed. The risks                            and benefits of the procedure and the sedation                            options and risks were discussed with the patient.                            All questions were answered, and informed consent                            was obtained. Prior Anticoagulants: The patient has                            taken no previous anticoagulant or antiplatelet  agents. ASA Grade Assessment: IV - A patient with                            severe systemic disease that is a constant threat                            to life. After reviewing the risks and benefits,                            the patient was deemed in satisfactory condition to                            undergo the procedure.                           After obtaining informed consent, the  endoscope was                            passed under direct vision. Throughout the                            procedure, the patient's blood pressure, pulse, and                            oxygen saturations were monitored continuously. The                            GIF-H190 ([id number] Olympus gastroscope was                            introduced through the mouth, and advanced to the                            second part of duodenum. The upper GI endoscopy was                            technically difficult and complex due to excessive                            bleeding. [Solution]. The patient tolerated the                            procedure fairly well. Scope In: Scope Out: Findings:      The examined esophagus was normal. No esophageal varices.      Clotted blood and red blood was found in the cardia, in the gastric       fundus and in the proximal gastric body. It was a large volume of clot       with active bleeding obscuring most of the proximal stomach. The source       of bleeding was not located however gastric varices were suspected based       on CT angio and volume of bleeding.      The exam of the stomach was otherwise normal.      The  duodenal bulb and second portion of the duodenum were normal. Impression:               - Normal esophagus.                           - Large volume of clotted blood and red blood in                            the cardia, in the gastric fundus and in the                            proximal gastric body that could not be cleared due                            to the volume of active bleeding and large size of                            the clots. Active proximal gastric bleeding,                            suspected bleeding gastric varices.                           - Normal duodenal bulb and second portion of the                            duodenum.                           - No specimens collected. Recommendation:           -  Return patient to ICU for ongoing care.                           - Maintain intubation for airway protection.                           - Continue present medications.                           - Consult IR for uncontrolled major UGI bleeding,                            suspected bleeding gastric varices. Procedure Code(s):        --- Professional ---                           [phone], Esophagogastroduodenoscopy, flexible,                            transoral; diagnostic, including collection of                            specimen(s) by brushing or washing, when performed                            (  separate procedure) Diagnosis Code(s):        --- Professional ---                           K92.2, Gastrointestinal hemorrhage, unspecified                           K92.0, Hematemesis CPT copyright 2019 [employer]. All rights reserved. The codes documented in this report are preliminary and upon coder review may  be revised to meet current compliance requirements. Ladene Artist, MD 11/15/2020 11:22:49 AM This report has been signed electronically. Number of Addenda: 0

## 2020-11-15 NOTE — Interval H&P Note (Signed)
History and Physical Interval Note:  11/15/2020 11:09 AM  Bradley Miles  has presented today for surgery, with the diagnosis of hematemesis.  The various methods of treatment have been discussed with the patient and family. After consideration of risks, benefits and other options for treatment, the patient has consented to  Procedure(s): ESOPHAGOGASTRODUODENOSCOPY (EGD) WITH PROPOFOL (N/A) as a surgical intervention.  The patient's history has been reviewed, patient examined, no change in status, stable for surgery.  I have reviewed the patient's chart and labs.  Questions were answered to the patient's satisfaction.     Pricilla Riffle. Fuller Plan

## 2020-11-15 NOTE — Transfer of Care (Signed)
Immediate Anesthesia Transfer of Care Note  Patient: Bradley Miles  Procedure(s) Performed: ESOPHAGOGASTRODUODENOSCOPY (EGD) WITH PROPOFOL (N/A )  Patient Location: ICU  Anesthesia Type:General  Level of Consciousness: Patient remains intubated per anesthesia plan  Airway & Oxygen Therapy: Patient remains intubated per anesthesia plan and Patient placed on Ventilator (see vital sign flow sheet for setting)  Post-op Assessment: Report given to RN and Post -op Vital signs reviewed and stable  Post vital signs: Reviewed and stable  Last Vitals:  Vitals Value Taken Time  BP 94/53 11/15/20 1150  Temp    Pulse 113 11/15/20 1153  Resp 20 11/15/20 1153  SpO2 98 % 11/15/20 1153  Vitals shown include unvalidated device data.  Last Pain:  Vitals:   11/15/20 1032  TempSrc: Temporal  PainSc: 0-No pain         Complications: No complications documented.

## 2020-11-15 NOTE — H&P (View-Only) (Signed)
Referring Provider:  [facility]         Primary Care Physician:  Erven Colla, DO Primary Gastroenterologist:  Manus Rudd, MD       We were asked to see this patient for:    hematemesis              ASSESSMENT / PLAN:   #54 year old male with decompensated Etoh cirrhosis (MELD-NA 13), ongoing Etoh abuse and recurrent upper GI bleeding with several episodes of hematemesis.  He is status post TIPS, embolization and most recently BRTO.  Multiple TIPS duplex have shown patency including one on 10/27/20. CT angio today suggests patent TIPS, multiple variceal embolization coils. However, there is evidence for residual varices at the gastric cardia region. Despite having had a BRTO procedure, there appears to be residual flow in a gastro renal shunt. Gastro renal shunt may be supplying the gastric cardia varices.  Patient is tachycardic but otherwise hemodynamically stable.  Hemoglobin 13.  INR 1.3. PCCM is admitting to ICU.  --Keep n.p.o. for EGD this am.  --Continue octreotide drip --Change PPI to continuous infusion --Monitor hemoglobin, transfuse if needed --Depending on EGD findings we may need to consult IR --Rocephin for SBP prophylaxis --Following EGD this morning he will need to get lactulose and Xifaxan.  Wife says he has been confused for a couple of days. He is lethargic on my exam but will wake up when prompted and is oriented  If not alert enough for p.o. after EGD then needs lactulose enemas --CIWA protocol per PCCM.      Attending Physician Note   I have taken a history, examined the patient and reviewed the chart. I agree with the Advanced Practitioner's note, impression and recommendations.   Decompensated alcoholic cirrhosis with ongoing alcohol abuse and a recurrent UGI bleed with large volume hematemesis today. S/P TIPS, coil embolization of varices and BRTO. [place] on 11/15 showed a patent TIPS. CT angio today shows a patent TIPS, multiple variceal embolization  coils, residual gastric cardia varices, residual flow in a gastro renal shunt which may be supplying the gastric varices, cirrhosis, small amount of ascites, thickening of the colon was most prominent in the distal sigmoid and rectum, cholelithiasis.   Medication recommendations as above  Urgent EGD today - R/O UGI bleed from varices, gastropathy, ulcer, MW tear, etc. IR consult  Monitor for alcohol withdrawal   [name], MD [facility]    HPI:                                                                                                                             Chief Complaint: Vomiting blood  Bradley Miles is a 54 y.o. male with a pmh significant for, not necessarily limited to: Cirrhosis with portal hypertension and history of upper GI bleeding, status post TIPS and BRTO,  Etoh abuse, cholelithiasis, GERD, anxiety, hypertension, DM  Patient is a 54 year old male with history of alcohol  abuse, cirrhosis with portal hypertension.  In December 2020 he underwent TIPS and embolization with IR. He has continued to have recurrent upper GI bleeding. In August 2021 EGD for UGI bleed showed grade 1-2 esophageal varices. Bleeding was felt to be secondary to portal HTN.   He has had several TIPS duplex including 10/24/2020 which showed TIPS patency.  He had a virtual visit with IR on 10/27/2020.  Plan was for duplex surveillance of the TIPS in 4 months unless he had recurrent symptoms/recurrent varices on the upper Endo at which time a more invasive study/CT imaging could be useful.   Patient began vomiting bright red blood approximately 10 hours ago per wife who is at the bedside.  History comes from the wife as patient is lethargic.  He is still actively drinking alcohol and was drinking at the time vomiting started.  The first episode of vomiting consisted of bright red blood.  He has also passed some dark stool per wife.  No NSAID use.  At home he takes lactulose,  Xifaxan, nadolol and is on a PPI.  He is also on iron supplements among several other medications.  In the ED his blood pressure is stable but he is tachycardic in the 120s.  He has been tachypneic at times.  Hemoglobin 13.2, hematocrit 39, platelets are clumped.  BUN and creatinine are normal.  Sodium 140, total bilirubin 2.6, alk phos 111, liver enzymes normal.  INR 1.3 no cardiopulmonary findings on chest x-ray.  CT angio of the abdomen and pelvis suggest patent TIPS, evidence for residual varices at the gastric cardia region.  See report below    [place] / PERTINENT STUDIES   January 2014 colonoscopy.   --Complete exam adequate prep --Hemorrhoids, diverticulosis and small hyperplastic polyp   08/08/20 EGD for hematemesis -Grade 1-2 esophageal varices?innocent appearing. Portal gastropathy -likely source of hematemesis in the setting of nausea and vomiting   10/24/20 US ABDOMINAL PELVIC ART/VENT FLOW DOPPLER  Addendum Date: 10/24/2020   ADDENDUM REPORT: 10/24/2020 15:08 ADDENDUM: Small volume ascites and signs of liver disease/cirrhosis as before. Electronically Signed   By: Zetta Bills M.D.   On: 10/24/2020 15:08   Result Date: 10/24/2020 CLINICAL DATA:  Tips, bleeding esophageal varices follow-up evaluation. EXAM: DUPLEX ULTRASOUND OF LIVER AND TIPS SHUNT TECHNIQUE: Color and duplex Doppler ultrasound was performed to evaluate the hepatic in-flow and out-flow vessels. COMPARISON:  Comparison study from August 05, 2020 FINDINGS: Portal Vein Velocities Main:  32 cm/sec Right:  123 cm/sec Left:  18 cm/sec TIPS Stent Velocities Proximal:  123 cm/sec Mid:  163 Distal:  117 cm/sec IVC: IVC is patent. Hepatic Vein Velocities Right:  117 cm/sec Mid:  14 cm/sec Left:  13 cm/sec Splenic Vein: Patent, 17 centimeters/second. Superior Mesenteric Vein: Not visualized Hepatic Artery: 197 cm per second Ascities: Small amount of ascites. Varices: Not seen Other findings: None  IMPRESSION: 1. Tips is patent with antegrade flow. Flow velocities are similar to the prior exam. 2. Portal vein is patent. 3. Hepatofugal flow in the LEFT portal vein. 4. Elevated velocities in the hepatic artery, not as elevated as on the previous ultrasound evaluation may be in part artifactual. The waveforms are normal. Electronically Signed: By: Zetta Bills M.D. On: 10/24/2020 14:56   11/15/2020  CT angio abdomen/pelvis with and without contrast IMPRESSION: VASCULAR  1. Stable appearance of the TIPS stent. The stent appears to be patent based on the venous phase imaging but this could be better characterized with  a dedicated liver duplex ultrasound. 2. Multiple variceal embolization coils. However, there is evidence for residual varices at the gastric cardia region. Despite having had a BRTO procedure, there appears to be residual flow in a gastro renal shunt. Gastro renal shunt may be supplying the gastric cardia varices. 3. No evidence for active arterial bleeding.  NON-VASCULAR  1. Cirrhosis with a small amount of ascites. Spleen is prominent for size. 2. Diffuse wall thickening in the colon, most prominent in the rectum and distal sigmoid colon region. Findings are concerning for colitis. Etiology is uncertain but this does represent a change since 08/05/2020. 3. Cholelithiasis.  Past Medical History:  Diagnosis Date  . Anxiety   . Controlled type 2 diabetes mellitus with complication, without long-term current use of insulin ([place]) 06/26/2020  . Enlarged liver   . GERD (gastroesophageal reflux disease)   . Hypertension   . Palpitations   . PVC's (premature ventricular contractions)   . Shortness of breath   . Tussive syncope 12/22/2019  . Varicose veins     Past Surgical History:  Procedure Laterality Date  . BIOPSY  02/02/2019   Procedure: BIOPSY;  Surgeon: Daneil Dolin, MD;  [facility];  Service: Endoscopy;;  gastric  . COLONOSCOPY WITH  ESOPHAGOGASTRODUODENOSCOPY (EGD)  12/16/2012   internal hemorrhoids, colonic diverticulosis, benign polyps, screening in 2024. EGD with mild erosive reflux esophagitis, small hiatal hernia, negative H.pylori  . cyst reomved     from spine  . ESOPHAGOGASTRODUODENOSCOPY  05/19/09   RMR: Geographic distal esophageal erosions consistent with severe erosive reflux esophagitis. schatzki's ring s/P dilation/small hiatal hernia otherwise normal stomach  . ESOPHAGOGASTRODUODENOSCOPY (EGD) WITH PROPOFOL N/A 02/02/2019   Dr. Gala Romney: retained gastric contents. portal HTN gastropathy  . ESOPHAGOGASTRODUODENOSCOPY (EGD) WITH PROPOFOL N/A 11/20/2019   Procedure: ESOPHAGOGASTRODUODENOSCOPY (EGD) WITH PROPOFOL;  Surgeon: Daneil Dolin, MD;  [facility];  Service: Endoscopy;  Laterality: N/A;  . ESOPHAGOGASTRODUODENOSCOPY (EGD) WITH PROPOFOL N/A 11/24/2019   Procedure: ESOPHAGOGASTRODUODENOSCOPY (EGD) WITH PROPOFOL;  Surgeon: Ronnette Juniper, MD;  Location: [place];  Service: Gastroenterology;  Laterality: N/A;  . ESOPHAGOGASTRODUODENOSCOPY (EGD) WITH PROPOFOL N/A 08/08/2020   Procedure: ESOPHAGOGASTRODUODENOSCOPY (EGD) WITH PROPOFOL;  Surgeon: Daneil Dolin, MD;  [facility];  Service: Endoscopy;  Laterality: N/A;  . IR ANGIOGRAM SELECTIVE EACH ADDITIONAL VESSEL  11/22/2019  . IR ANGIOGRAM SELECTIVE EACH ADDITIONAL VESSEL  11/24/2019  . IR ANGIOGRAM SELECTIVE EACH ADDITIONAL VESSEL  11/24/2019  . IR ANGIOGRAM SELECTIVE EACH ADDITIONAL VESSEL  11/24/2019  . IR EMBO ART  VEN HEMORR LYMPH EXTRAV  INC GUIDE ROADMAPPING  11/22/2019  . IR EMBO ART  VEN HEMORR LYMPH EXTRAV  INC GUIDE ROADMAPPING  11/24/2019  . IR RADIOLOGIST EVAL & MGMT  12/29/2019  . IR RADIOLOGIST EVAL & MGMT  10/27/2020  . IR TIPS  11/24/2019  . IR US GUIDE VASC ACCESS RIGHT  11/22/2019  . IR VENOGRAM RENAL UNI LEFT  11/22/2019  . RADIOLOGY WITH ANESTHESIA N/A 11/24/2019   Procedure: IR WITH ANESTHESIA;  Surgeon:  Radiologist, Medication, MD;  Location: [place];  Service: Radiology;  Laterality: N/A;  . TIPS PROCEDURE N/A 11/22/2019   Procedure: BRTO/TRANS-JUGULAR INTRAHEPATIC PORTAL SHUNT (TIPS);  Surgeon: Corrie Mckusick, DO;  Location: [place];  Service: Anesthesiology;  Laterality: N/A;  . WISDOM TOOTH EXTRACTION      Prior to Admission medications   Medication Sig Start Date End Date Taking? Authorizing Provider  allopurinol (ZYLOPRIM) 100 MG tablet TAKE 1 TABLET  BY MOUTH EVERY DAY Patient taking differently: Take 100 mg by mouth daily.  12/10/19   [name], MD  b complex vitamins tablet Take 1 tablet by mouth daily.    [provider]  blood glucose meter kit and supplies Dispense based on patient and insurance preference. Test blood sugar once daily.  (FOR ICD-10 E10.9, E11.9). 04/21/20   Erven Colla, DO  Cholecalciferol (D3-1000) 25 MCG (1000 UT) capsule Take 1,000 Units by mouth daily.    [provider]  dexlansoprazole (DEXILANT) 60 MG capsule Take 1 capsule (60 mg total) by mouth daily. 08/08/20   Roxan Hockey, MD  ferrous sulfate 325 (65 FE) MG EC tablet Take 1 tablet (325 mg total) by mouth 2 (two) times daily. Patient not taking: Reported on 08/05/2020 11/29/19 06/21/20  Elgergawy, Silver Huguenin, MD  ferrous sulfate 325 (65 FE) MG EC tablet Take 1 tablet (325 mg total) by mouth 2 (two) times daily. 08/08/20 09/07/20  Roxan Hockey, MD  folic acid (FOLVITE) 097 MCG tablet Take 400 mcg by mouth daily.    [provider]  furosemide (LASIX) 40 MG tablet Take 0.5 tablets (20 mg total) by mouth daily. Take Daily for 5 Days, Then Take Daily Only As Needed for Edema 08/08/20   Roxan Hockey, MD  lactulose, encephalopathy, (CHRONULAC) 10 GM/15ML SOLN Take 15-30cc 1-2 daily to Titrate for 3-4 soft bowel movements a day 11/10/20   Carlis Stable, NP  LORazepam (ATIVAN) 1 MG tablet Take 1 mg by mouth as needed for anxiety.    [provider]  Multiple Vitamin  (MULTIVITAMIN WITH MINERALS) TABS tablet Take 1 tablet by mouth daily. 11/30/19   Elgergawy, Silver Huguenin, MD  nadolol (CORGARD) 40 MG tablet Take 1.5 tablets (60 mg total) by mouth daily. 08/08/20   Roxan Hockey, MD  naltrexone (DEPADE) 50 MG tablet Take 1 tablet p.o. daily and on every 6th day take 2 tablets. 04/04/20   Lovena Le, Malena M, DO  ondansetron (ZOFRAN-ODT) 4 MG disintegrating tablet DISSOLVE 1 TABLET ON THE TONGUE EVERY 8 HOURS AS NEEDED FOR NAUSEA Patient taking differently: Take 4 mg by mouth every 8 (eight) hours as needed for nausea or vomiting.  01/20/20   Annitta Needs, NP  PARoxetine (PAXIL) 20 MG tablet Take 1 tablet (20 mg total) by mouth daily. 08/08/20   Roxan Hockey, MD  potassium chloride SA (KLOR-CON) 20 MEQ tablet Take 1 tablet (20 mEq total) by mouth daily as needed (Take when taking Lasix). 08/08/20   Roxan Hockey, MD  rifaximin (XIFAXAN) 550 MG TABS tablet Take 1 tablet (550 mg total) by mouth 2 (two) times daily. 11/10/20   Carlis Stable, NP  sildenafil (VIAGRA) 100 MG tablet TAKE 1/2 TABLET BY MOUTH EVERY DAY Patient taking differently: Take 50 mg by mouth as needed for erectile dysfunction.  03/29/20   [name], MD  sitaGLIPtin (JANUVIA) 50 MG tablet Take 1 tablet (50 mg total) by mouth daily. 10/25/20   Lovena Le, Malena M, DO  sucralfate (CARAFATE) 1 g tablet TAKE 1 TABLET BY MOUTH BEFORE MEALS 10/25/20   Lovena Le, Malena M, DO  traZODone (DESYREL) 50 MG tablet TAKE 1 TABLET BY MOUTH AT BEDTIME AS NEEDED FOR INSOMNIA 08/18/20   Lovena Le, Malena M, DO  venlafaxine (EFFEXOR) 25 MG tablet Take 25 mg by mouth 2 (two) times daily. Pt isn't aware of the dosage    [provider]    Current Facility-Administered Medications  Medication Dose Route  Frequency Provider Last Rate Last Admin  . 0.9 %  sodium chloride infusion   Intravenous Continuous Cardama, Grayce Sessions, MD 125 mL/hr at 11/15/20 0844 Restarted at 11/15/20 0844  . [START ON 07/15/7618] folic acid  (FOLVITE) tablet 1 mg  1 mg Oral Daily Jennelle Human B, NP      . insulin aspart (novoLOG) injection 0-9 Units  0-9 Units Subcutaneous Q4H Jennelle Human B, NP      . LORazepam (ATIVAN) injection 0-4 mg  0-4 mg Intravenous Q6H Jennelle Human B, NP       Followed by  . [START ON 11/17/2020] LORazepam (ATIVAN) injection 0-4 mg  0-4 mg Intravenous Q8H Arnell Asal, NP      . Derrill Memo ON 11/16/2020] multivitamin with minerals tablet 1 tablet  1 tablet Oral Daily Jennelle Human B, NP      . octreotide (SANDOSTATIN) 500 mcg in sodium chloride 0.9 % 250 mL (2 mcg/mL) infusion  50 mcg/hr Intravenous Continuous Fatima Blank, MD 25 mL/hr at 11/15/20 0650 50 mcg/hr at 11/15/20 0650  . thiamine tablet 100 mg  100 mg Oral Daily Jennelle Human B, NP       Or  . thiamine (B-1) injection 100 mg  100 mg Intravenous Daily Arnell Asal, NP       Current Outpatient Medications  Medication Sig Dispense Refill  . allopurinol (ZYLOPRIM) 100 MG tablet TAKE 1 TABLET BY MOUTH EVERY DAY (Patient taking differently: Take 100 mg by mouth daily. ) 30 tablet 5  . b complex vitamins tablet Take 1 tablet by mouth daily.    . blood glucose meter kit and supplies Dispense based on patient and insurance preference. Test blood sugar once daily.  (FOR ICD-10 E10.9, E11.9). 1 each 5  . Cholecalciferol (D3-1000) 25 MCG (1000 UT) capsule Take 1,000 Units by mouth daily.    Marland Kitchen dexlansoprazole (DEXILANT) 60 MG capsule Take 1 capsule (60 mg total) by mouth daily. 30 capsule 3  . ferrous sulfate 325 (65 FE) MG EC tablet Take 1 tablet (325 mg total) by mouth 2 (two) times daily. (Patient not taking: Reported on 08/05/2020) 60 tablet 3  . ferrous sulfate 325 (65 FE) MG EC tablet Take 1 tablet (325 mg total) by mouth 2 (two) times daily. 60 tablet 3  . folic acid (FOLVITE) 509 MCG tablet Take 400 mcg by mouth daily.    . furosemide (LASIX) 40 MG tablet Take 0.5 tablets (20 mg total) by mouth daily. Take Daily for 5 Days, Then  Take Daily Only As Needed for Edema 30 tablet 11  . lactulose, encephalopathy, (CHRONULAC) 10 GM/15ML SOLN Take 15-30cc 1-2 daily to Titrate for 3-4 soft bowel movements a day 1892 mL 11  . LORazepam (ATIVAN) 1 MG tablet Take 1 mg by mouth as needed for anxiety.    . Multiple Vitamin (MULTIVITAMIN WITH MINERALS) TABS tablet Take 1 tablet by mouth daily.    . nadolol (CORGARD) 40 MG tablet Take 1.5 tablets (60 mg total) by mouth daily. 135 tablet 3  . naltrexone (DEPADE) 50 MG tablet Take 1 tablet p.o. daily and on every 6th day take 2 tablets. 35 tablet 3  . ondansetron (ZOFRAN-ODT) 4 MG disintegrating tablet DISSOLVE 1 TABLET ON THE TONGUE EVERY 8 HOURS AS NEEDED FOR NAUSEA (Patient taking differently: Take 4 mg by mouth every 8 (eight) hours as needed for nausea or vomiting. ) 24 tablet 2  . PARoxetine (PAXIL) 20 MG tablet Take 1 tablet (20  mg total) by mouth daily. 30 tablet 3  . potassium chloride SA (KLOR-CON) 20 MEQ tablet Take 1 tablet (20 mEq total) by mouth daily as needed (Take when taking Lasix). 30 tablet 11  . rifaximin (XIFAXAN) 550 MG TABS tablet Take 1 tablet (550 mg total) by mouth 2 (two) times daily. 60 tablet 11  . sildenafil (VIAGRA) 100 MG tablet TAKE 1/2 TABLET BY MOUTH EVERY DAY (Patient taking differently: Take 50 mg by mouth as needed for erectile dysfunction. ) 3 tablet 3  . sitaGLIPtin (JANUVIA) 50 MG tablet Take 1 tablet (50 mg total) by mouth daily. 90 tablet 1  . sucralfate (CARAFATE) 1 g tablet TAKE 1 TABLET BY MOUTH BEFORE MEALS 90 tablet 1  . traZODone (DESYREL) 50 MG tablet TAKE 1 TABLET BY MOUTH AT BEDTIME AS NEEDED FOR INSOMNIA 30 tablet 2  . venlafaxine (EFFEXOR) 25 MG tablet Take 25 mg by mouth 2 (two) times daily. Pt isn't aware of the dosage      Allergies as of 11/15/2020  . (No Known Allergies)    Family History  Problem Relation Age of Onset  . Cancer Mother        ? etiology  . Heart disease Mother   . Colon polyps Sister 27       two sisters   . Brain cancer Father   . Liver disease Neg Hx     Social History   Socioeconomic History  . Marital status: Married    Spouse name: Not on file  . Number of children: 2  . Years of education: 53  . Highest education level: High school graduate  Occupational History  . 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 Smoking status: Current Every Day Smoker    Packs/day: 1.00    Years: 30.00    Pack years: 30.00    Types: Cigarettes    Start date: 12/24/1979  . Smokeless tobacco: Never Used  Vaping Use  . Vaping Use: Never used  Substance and Sexual Activity  . Alcohol use: Yes    Alcohol/week: 0.0 standard drinks    Comment: on 11/10/20: still drinking "4-5 glasses of wine a day"  . Drug use: No  . Sexual activity: Yes    Partners: Female    Birth control/protection: None  Other Topics Concern  . Not on file  Social History Narrative   Lives at home with family.   Right-handed.   No daily use of caffeine.   Social Determinants of Health   Financial Resource Strain:   . Difficulty of Paying Living Expenses: Not on file  Food Insecurity:   . Worried About Charity fundraiser in the Last Year: Not on file  . Ran Out of Food in the Last Year: Not on file  Transportation Needs:   . Lack of Transportation (Medical): Not on file  . Lack of Transportation (Non-Medical): Not on file  Physical Activity:   . Days of Exercise per Week: Not on file  . Minutes of Exercise per Session: Not on file  Stress:   . Feeling of Stress : Not on file  Social Connections:   . Frequency of Communication with Friends and Family: Not on file  . Frequency of Social Gatherings with Friends and Family: Not on file  . Attends Religious Services: Not on file  . Active Member of Clubs or Organizations: Not on file  . Attends Archivist Meetings: Not  on file  . Marital Status: Not on file  Intimate Partner Violence:   . Fear of Current or  Ex-Partner: Not on file  . Emotionally Abused: Not on file  . Physically Abused: Not on file  . Sexually Abused: Not on file    Review of Systems: All systems reviewed and negative except where noted in HPI.    OBJECTIVE:    Physical Exam: Vital signs in last 24 hours: Temp:  [97.9 F (36.6 C)-98.3 F (36.8 C)] 98.3 F (36.8 C) (12/07 0730) Pulse Rate:  [102-128] 123 (12/07 0827) Resp:  [9-26] 13 (12/07 0730) BP: (116-151)/(70-84) 140/81 (12/07 0827) SpO2:  [91 %-95 %] 92 % (12/07 0730)   General:   Lethargic white male  Eyes:  Pupils equal,    Conjunctiva pink. Ears:  Normal auditory acuity. Nose:  No deformity, discharge,  or lesions. Neck:  Supple; no masses Lungs:  Clear throughout to auscultation.   No wheezes, crackles, or rhonchi.  Heart:  Sinus tachycardia,  no lower extremity edema Abdomen:  Soft, non-distended, nontender, BS active, no palp mass   Rectal:  Deferred  Msk:  Symmetrical without gross deformities. . Neurologic:  Mild asterixis. Arousable and oriented to person, place and time Skin:  Intact without significant lesions or rashes.  There were no vitals filed for this visit.   Scheduled inpatient medications . [START ON 28/01/812] folic acid  1 mg Oral Daily  . insulin aspart  0-9 Units Subcutaneous Q4H  . LORazepam  0-4 mg Intravenous Q6H   Followed by  . [START ON 11/17/2020] LORazepam  0-4 mg Intravenous Q8H  . [START ON 11/16/2020] multivitamin with minerals  1 tablet Oral Daily  . thiamine  100 mg Oral Daily   Or  . thiamine  100 mg Intravenous Daily      Intake/Output from previous day: 12/06 0701 - 12/07 0700 In: 100 [IV Piggyback:100] Out: 600 [Emesis/NG output:600] Intake/Output this shift: Total I/O In: 1000 [IV Piggyback:1000] Out: -    Lab Results: Recent Labs    11/15/20 0500  WBC 7.5  HGB 13.2  HCT 39.1  PLT PLATELET CLUMPS NOTED ON SMEAR, UNABLE TO ESTIMATE   BMET Recent Labs    11/15/20 0500  NA 140  K 4.5   CL 104  CO2 27  GLUCOSE 258*  BUN 12  CREATININE 0.90  CALCIUM 8.3*   LFT Recent Labs    11/15/20 0500  PROT 6.2*  ALBUMIN 2.5*  AST 38  ALT 18  ALKPHOS 111  BILITOT 2.6*   PT/INR No results for input(s): LABPROT, INR in the last 72 hours. Hepatitis Panel No results for input(s): HEPBSAG, HCVAB, HEPAIGM, HEPBIGM in the last 72 hours.   . CBC Latest Ref Rng & Units 11/15/2020 10/24/2020 08/08/2020  WBC 4.0 - 10.5 K/uL 7.5 4.6 3.0(L)  Hemoglobin 13.0 - 17.0 g/dL 13.2 12.5(L) 10.6(L)  Hematocrit 39 - 52 % 39.1 36.4(L) 32.2(L)  Platelets 150 - 400 K/uL PLATELET CLUMPS NOTED ON SMEAR, UNABLE TO ESTIMATE 84(LL) 53(L)    . CMP Latest Ref Rng & Units 11/15/2020 10/24/2020 08/08/2020  Glucose 70 - 99 mg/dL 258(H) 350(H) 94  BUN 6 - 20 mg/dL 12 11 <5(L)  Creatinine 0.61 - 1.24 mg/dL 0.90 0.86 0.49(L)  Sodium 135 - 145 mmol/L 140 137 141  Potassium 3.5 - 5.1 mmol/L 4.5 3.9 3.3(L)  Chloride 98 - 111 mmol/L 104 100 107  CO2 22 - 32 mmol/L 27 22 27   Calcium 8.9 -  10.3 mg/dL 8.3(L) 8.6(L) 7.9(L)  Total Protein 6.5 - 8.1 g/dL 6.2(L) 6.6 5.1(L)  Total Bilirubin 0.3 - 1.2 mg/dL 2.6(H) 1.4(H) 2.9(H)  Alkaline Phos 38 - 126 U/L 111 113 133(H)  AST 15 - 41 U/L 38 41(H) 87(H)  ALT 0 - 44 U/L 18 20 26    Studies/Results: DG Chest Port 1 View  Result Date: 11/15/2020 CLINICAL DATA:  Hematemesis since last evening. EXAM: PORTABLE CHEST 1 VIEW COMPARISON:  Chest x-ray 08/05/2020 FINDINGS: The cardiac silhouette, mediastinal hilar contours are within normal limits. The lungs are clear. No pleural effusions. No pulmonary lesions. No pneumothorax. The bony thorax is intact. IMPRESSION: No acute cardiopulmonary findings. Electronically Signed   By: Marijo Sanes M.D.   On: 11/15/2020 06:46    Active Problems:   Hematemesis    Tye Savoy, NP-C @  11/15/2020, 8:58 AM

## 2020-11-15 NOTE — ED Notes (Signed)
Pt had a large amount of blood emesis, filled emesis bag to about 600cc. Provider aware. Compazine given  Pt alert and oriented after vomiting  VS remain stable.   Pt diaphoretic and weak

## 2020-11-15 NOTE — Anesthesia Preprocedure Evaluation (Signed)
Anesthesia Evaluation  Patient identified by MRN, date of birth, ID band Patient unresponsive    Reviewed: Allergy & Precautions, NPO status , Patient's Chart, lab work & pertinent test resultsPreop documentation limited or incomplete due to emergent nature of procedure.  Airway Mallampati: Intubated       Dental   Pulmonary shortness of breath, COPD, Current Smoker and Patient abstained from smoking.,    breath sounds clear to auscultation       Cardiovascular hypertension,  Rhythm:Regular Rate:Normal     Neuro/Psych  Headaches, Anxiety  Neuromuscular disease    GI/Hepatic GERD  ,(+) Hepatitis -History noted CG   Endo/Other  diabetes  Renal/GU      Musculoskeletal   Abdominal   Peds  Hematology  (+) anemia ,   Anesthesia Other Findings   Reproductive/Obstetrics                             Anesthesia Physical Anesthesia Plan  ASA: III and emergent  Anesthesia Plan: General   Post-op Pain Management:    Induction: Intravenous  PONV Risk Score and Plan: Midazolam  Airway Management Planned: Oral ETT  Additional Equipment:   Intra-op Plan:   Post-operative Plan: Post-operative intubation/ventilation  Informed Consent: I have reviewed the patients History and Physical, chart, labs and discussed the procedure including the risks, benefits and alternatives for the proposed anesthesia with the patient or authorized representative who has indicated his/her understanding and acceptance.       Plan Discussed with: CRNA and Anesthesiologist  Anesthesia Plan Comments:         Anesthesia Quick Evaluation

## 2020-11-15 NOTE — ED Provider Notes (Addendum)
[facility] EMERGENCY DEPARTMENT Provider Note  CSN: [id number] Arrival date & time: 11/15/20 0431  Chief Complaint(s) Hematemesis  HPI Bradley Miles is a 54 y.o. 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 Patient has had approximately 5-6 bouts of red/maroon hematemesis. Last of which was here in the ED. Approximately 16 to 20 ounces each time. He denies any nausea. No chest pain or shortness of breath. No abdominal pain. No melena.  Patient reports that he is still drinking 10-12 12 ounce cocktails.  Reports that he is no longer drinking 1/2 gallon of liquor.    HPI  Past Medical History Past Medical History:  Diagnosis Date  . Anxiety   . Controlled type 2 diabetes mellitus with complication, without long-term current use of insulin ([place]) 06/26/2020  . Enlarged liver   . GERD (gastroesophageal reflux disease)   . Hypertension   . Palpitations   . PVC's (premature ventricular contractions)   . Shortness of breath   . Tussive syncope 12/22/2019  . Varicose veins    Patient Active Problem List   Diagnosis Date Noted  . GIB (gastrointestinal bleeding) 08/05/2020  . Acute GI bleeding 08/05/2020  . Gastric varices   . Thrombocytopenia ([place]) 06/26/2020  . Controlled type 2 diabetes mellitus with complication, without long-term current use of insulin ([place]) 06/26/2020  . Tussive syncope 12/22/2019  . Acute blood loss anemia   . Alcohol abuse   . Bleeding esophageal varices ([place])   . Hematemesis/vomiting blood 11/20/2019  . RUQ pain 07/30/2019  . Abnormal LFTs 07/30/2019  . Bilateral leg edema 06/03/2019  . Portal hypertension ([place]) 11/26/2018  . Hepatic steatosis 06/27/2018  . Abdominal pain   . Alcoholic hepatitis without ascites   . Acute hepatic encephalopathy 06/01/2018  . Acute respiratory failure with  hypoxia ([place]) 06/01/2018  . Alcoholic cirrhosis of liver with ascites ([place]) 06/01/2018  . COPD with acute exacerbation ([place]) 06/01/2018  . 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 Increased ammonia level   . COPD, mild ([place]) 05/14/2017  . Right leg claudication ([place]) 05/14/2017  . Prediabetes 02/04/2017  . Chest pain 12/21/2015  . Right leg DVT ([place]) 07/18/2015  . Elevated transaminase measurement 07/18/2015  . Sensory neuropathy 07/16/2015  . Anxiety 12/23/2014  . EtOH dependence ([place]) 02/09/2014  . Headache in back of head 01/12/2014  . Superficial thrombosis of lower extremity 07/01/2013  . Hematochezia 11/17/2012  . Mixed hyperlipidemia 02/15/2012  . Hypertension 01/18/2012  . Insomnia 01/18/2012  . Family history of colonic polyps 11/15/2011  . Palpitations 10/04/2011  . TOBACCO ABUSE 05/18/2010  . GERD 05/18/2010  . CONSTIPATION 05/18/2010  . 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 DYSPHAGIA UNSPECIFIED 05/18/2010  . ABDOMINAL PAIN, RIGHT LOWER QUADRANT 05/18/2010   Home Medication(s) Prior to Admission medications   Medication Sig Start Date End Date Taking? Authorizing Provider  allopurinol (ZYLOPRIM) 100 MG tablet TAKE 1 TABLET BY MOUTH EVERY DAY Patient taking differently: Take 100 mg by mouth daily.  12/10/19   [name], MD  b complex vitamins tablet Take 1 tablet by mouth daily.    [provider]  blood glucose meter kit and supplies Dispense based on patient and insurance preference. Test blood sugar once daily.  (FOR ICD-10 E10.9, E11.9). 04/21/20   Erven Colla, DO  Cholecalciferol (D3-1000) 25 MCG (1000 UT) capsule Take 1,000 Units  by mouth daily.    [provider]  dexlansoprazole (DEXILANT) 60 MG capsule Take 1 capsule (60 mg total) by mouth daily. 08/08/20   Roxan Hockey, MD  ferrous sulfate 325 (65 FE) MG EC tablet Take 1 tablet (325 mg total) by mouth 2 (two) times daily. Patient not taking: Reported on  08/05/2020 11/29/19 06/21/20  Elgergawy, Silver Huguenin, MD  ferrous sulfate 325 (65 FE) MG EC tablet Take 1 tablet (325 mg total) by mouth 2 (two) times daily. 08/08/20 09/07/20  Roxan Hockey, MD  folic acid (FOLVITE) 330 MCG tablet Take 400 mcg by mouth daily.    [provider]  furosemide (LASIX) 40 MG tablet Take 0.5 tablets (20 mg total) by mouth daily. Take Daily for 5 Days, Then Take Daily Only As Needed for Edema 08/08/20   Roxan Hockey, MD  lactulose, encephalopathy, (CHRONULAC) 10 GM/15ML SOLN Take 15-30cc 1-2 daily to Titrate for 3-4 soft bowel movements a day 11/10/20   Carlis Stable, NP  LORazepam (ATIVAN) 1 MG tablet Take 1 mg by mouth as needed for anxiety.    [provider]  Multiple Vitamin (MULTIVITAMIN WITH MINERALS) TABS tablet Take 1 tablet by mouth daily. 11/30/19   Elgergawy, Silver Huguenin, MD  nadolol (CORGARD) 40 MG tablet Take 1.5 tablets (60 mg total) by mouth daily. 08/08/20   Roxan Hockey, MD  naltrexone (DEPADE) 50 MG tablet Take 1 tablet p.o. daily and on every 6th day take 2 tablets. 04/04/20   Lovena Le, Malena M, DO  ondansetron (ZOFRAN-ODT) 4 MG disintegrating tablet DISSOLVE 1 TABLET ON THE TONGUE EVERY 8 HOURS AS NEEDED FOR NAUSEA Patient taking differently: Take 4 mg by mouth every 8 (eight) hours as needed for nausea or vomiting.  01/20/20   Annitta Needs, NP  PARoxetine (PAXIL) 20 MG tablet Take 1 tablet (20 mg total) by mouth daily. 08/08/20   Roxan Hockey, MD  potassium chloride SA (KLOR-CON) 20 MEQ tablet Take 1 tablet (20 mEq total) by mouth daily as needed (Take when taking Lasix). 08/08/20   Roxan Hockey, MD  rifaximin (XIFAXAN) 550 MG TABS tablet Take 1 tablet (550 mg total) by mouth 2 (two) times daily. 11/10/20   Carlis Stable, NP  sildenafil (VIAGRA) 100 MG tablet TAKE 1/2 TABLET BY MOUTH EVERY DAY Patient taking differently: Take 50 mg by mouth as needed for erectile dysfunction.  03/29/20   [name], MD  sitaGLIPtin (JANUVIA) 50  MG tablet Take 1 tablet (50 mg total) by mouth daily. 10/25/20   Lovena Le, Malena M, DO  sucralfate (CARAFATE) 1 g tablet TAKE 1 TABLET BY MOUTH BEFORE MEALS 10/25/20   Lovena Le, Malena M, DO  traZODone (DESYREL) 50 MG tablet TAKE 1 TABLET BY MOUTH AT BEDTIME AS NEEDED FOR INSOMNIA 08/18/20   Lovena Le, Malena M, DO  venlafaxine (EFFEXOR) 25 MG tablet Take 25 mg by mouth 2 (two) times daily. Pt isn't aware of the dosage    [provider]  Past Surgical History Past Surgical History:  Procedure Laterality Date  . BIOPSY  02/02/2019   Procedure: BIOPSY;  Surgeon: Daneil Dolin, MD;  [facility];  Service: Endoscopy;;  gastric  . COLONOSCOPY WITH ESOPHAGOGASTRODUODENOSCOPY (EGD)  12/16/2012   internal hemorrhoids, colonic diverticulosis, benign polyps, screening in 2024. EGD with mild erosive reflux esophagitis, small hiatal hernia, negative H.pylori  . cyst reomved     from spine  . ESOPHAGOGASTRODUODENOSCOPY  05/19/09   RMR: Geographic distal esophageal erosions consistent with severe erosive reflux esophagitis. schatzki's ring s/P dilation/small hiatal hernia otherwise normal stomach  . ESOPHAGOGASTRODUODENOSCOPY (EGD) WITH PROPOFOL N/A 02/02/2019   Dr. Gala Romney: retained gastric contents. portal HTN gastropathy  . ESOPHAGOGASTRODUODENOSCOPY (EGD) WITH PROPOFOL N/A 11/20/2019   Procedure: ESOPHAGOGASTRODUODENOSCOPY (EGD) WITH PROPOFOL;  Surgeon: Daneil Dolin, MD;  [facility];  Service: Endoscopy;  Laterality: N/A;  . ESOPHAGOGASTRODUODENOSCOPY (EGD) WITH PROPOFOL N/A 11/24/2019   Procedure: ESOPHAGOGASTRODUODENOSCOPY (EGD) WITH PROPOFOL;  Surgeon: Ronnette Juniper, MD;  Location: [facility];  Service: Gastroenterology;  Laterality: N/A;  . ESOPHAGOGASTRODUODENOSCOPY (EGD) WITH PROPOFOL N/A 08/08/2020   Procedure: ESOPHAGOGASTRODUODENOSCOPY (EGD)  WITH PROPOFOL;  Surgeon: Daneil Dolin, MD;  [facility];  Service: Endoscopy;  Laterality: N/A;  . IR ANGIOGRAM SELECTIVE EACH ADDITIONAL VESSEL  11/22/2019  . IR ANGIOGRAM SELECTIVE EACH ADDITIONAL VESSEL  11/24/2019  . IR ANGIOGRAM SELECTIVE EACH ADDITIONAL VESSEL  11/24/2019  . IR ANGIOGRAM SELECTIVE EACH ADDITIONAL VESSEL  11/24/2019  . IR EMBO ART  VEN HEMORR LYMPH EXTRAV  INC GUIDE ROADMAPPING  11/22/2019  . IR EMBO ART  VEN HEMORR LYMPH EXTRAV  INC GUIDE ROADMAPPING  11/24/2019  . IR RADIOLOGIST EVAL & MGMT  12/29/2019  . IR RADIOLOGIST EVAL & MGMT  10/27/2020  . IR TIPS  11/24/2019  . IR US GUIDE VASC ACCESS RIGHT  11/22/2019  . IR VENOGRAM RENAL UNI LEFT  11/22/2019  . RADIOLOGY WITH ANESTHESIA N/A 11/24/2019   Procedure: IR WITH ANESTHESIA;  Surgeon: Radiologist, Medication, MD;  Location: [facility];  Service: Radiology;  Laterality: N/A;  . TIPS PROCEDURE N/A 11/22/2019   Procedure: BRTO/TRANS-JUGULAR INTRAHEPATIC PORTAL SHUNT (TIPS);  Surgeon: Corrie Mckusick, DO;  Location: [facility];  Service: Anesthesiology;  Laterality: N/A;  . WISDOM TOOTH EXTRACTION     Family History Family History  Problem Relation Age of Onset  . Cancer Mother        ? etiology  . Heart disease Mother   . Colon polyps Sister 107       two sisters  . Brain cancer Father   . Liver disease Neg Hx     Social History Social History   Tobacco Use  . Smoking status: Current Every Day Smoker    Packs/day: 1.00    Years: 30.00    Pack years: 30.00    Types: Cigarettes    Start date: 12/24/1979  . Smokeless tobacco: Never Used  Vaping Use  . Vaping Use: Never used  Substance Use Topics  . Alcohol use: Yes    Alcohol/week: 0.0 standard drinks    Comment: on 11/10/20: still drinking "4-5 glasses of wine a day"  . Drug use: No   Allergies Patient has no known allergies.  Review of Systems Review of Systems All other systems are reviewed and are negative for acute change except as noted  in the HPI  Physical Exam Vital Signs  I have reviewed the triage vital signs BP (!) 151/81   Pulse Marland Kitchen)  103   Temp 97.9 F (36.6 C)   Resp 15   SpO2 91%   Physical Exam Vitals reviewed.  Constitutional:      General: He is not in acute distress.    Appearance: He is well-developed. He is not diaphoretic.  HENT:     Head: Normocephalic and atraumatic.     Comments:   Dried blood on lips and mouth.    Nose: Nose normal.  Eyes:     General: No scleral icterus.       Right eye: No discharge.        Left eye: No discharge.     Conjunctiva/sclera: Conjunctivae normal.     Pupils: Pupils are equal, round, and reactive to light.  Cardiovascular:     Rate and Rhythm: Regular rhythm. Tachycardia present.     Heart sounds: No murmur heard.  No friction rub. No gallop.   Pulmonary:     Effort: Pulmonary effort is normal. No respiratory distress.     Breath sounds: Normal breath sounds. No stridor. No rales.  Abdominal:     General: There is no distension.     Palpations: Abdomen is soft.     Tenderness: There is no abdominal tenderness.  Musculoskeletal:        General: No tenderness.     Cervical back: Normal range of motion and neck supple.  Skin:    General: Skin is warm and dry.     Findings: No erythema or rash.  Neurological:     Mental Status: He is alert and oriented to person, place, and time.     ED Results and Treatments Labs (all labs ordered are listed, but only abnormal results are displayed) Labs Reviewed  COMPREHENSIVE METABOLIC PANEL - Abnormal; Notable for the following components:      Result Value   Glucose, Bld 258 (*)    Calcium 8.3 (*)    Total Protein 6.2 (*)    Albumin 2.5 (*)    Total Bilirubin 2.6 (*)    All other components within normal limits  CBC - Abnormal; Notable for the following components:   RBC 3.59 (*)    MCV 108.9 (*)    MCH 36.8 (*)    RDW 16.2 (*)    All other components within normal limits  RESP PANEL BY RT-PCR (FLU  A&B, COVID) ARPGX2  LIPASE, BLOOD  URINALYSIS, ROUTINE W REFLEX MICROSCOPIC  PROTIME-INR  TYPE AND SCREEN                                                                                                                         EKG  EKG Interpretation  Date/Time:    Ventricular Rate:    PR Interval:    QRS Duration:   QT Interval:    QTC Calculation:   R Axis:     Text Interpretation:        Radiology DG Chest Port 1 View  Result Date: 11/15/2020 CLINICAL  DATA:  Hematemesis since last evening. EXAM: PORTABLE CHEST 1 VIEW COMPARISON:  Chest x-ray 08/05/2020 FINDINGS: The cardiac silhouette, mediastinal hilar contours are within normal limits. The lungs are clear. No pleural effusions. No pulmonary lesions. No pneumothorax. The bony thorax is intact. IMPRESSION: No acute cardiopulmonary findings. Electronically Signed   By: Marijo Sanes M.D.   On: 11/15/2020 06:46    Pertinent labs & imaging results that were available during my care of the patient were reviewed by me and considered in my medical decision making (see chart for details).  Medications Ordered in ED Medications  sodium chloride 0.9 % bolus 1,000 mL (0 mLs Intravenous Stopped 11/15/20 0707)    And  sodium chloride 0.9 % bolus 1,000 mL (1,000 mLs Intravenous New Bag/Given 11/15/20 0706)    And  0.9 %  sodium chloride infusion (has no administration in time range)  octreotide (SANDOSTATIN) 2 mcg/mL load via infusion 50 mcg (50 mcg Intravenous Bolus from Bag 11/15/20 0648)    And  octreotide (SANDOSTATIN) 500 mcg in sodium chloride 0.9 % 250 mL (2 mcg/mL) infusion (50 mcg/hr Intravenous New Bag/Given 11/15/20 0650)  ondansetron (ZOFRAN) injection 4 mg (4 mg Intravenous Given 11/15/20 0606)  pantoprazole (PROTONIX) injection 40 mg (40 mg Intravenous Given 11/15/20 0607)  cefTRIAXone (ROCEPHIN) 1 g in sodium chloride 0.9 % 100 mL IVPB (0 g Intravenous Stopped 11/15/20 0640)  prochlorperazine (COMPAZINE) injection 10 mg (10 mg  Intravenous Given 11/15/20 0658)                                                                                                                                    Procedures .1-3 Lead EKG Interpretation Performed by: Fatima Blank, MD Authorized by: Fatima Blank, MD     Interpretation: normal     ECG rate:  101   ECG rate assessment: tachycardic     Rhythm: sinus rhythm     Ectopy: none     Conduction: normal   .Critical Care Performed by: Fatima Blank, MD Authorized by: Fatima Blank, MD    CRITICAL CARE Performed by: Grayce Sessions Reshunda Strider Total critical care time: 60 minutes Critical care time was exclusive of separately billable procedures and treating other patients. Critical care was necessary to treat or prevent imminent or life-threatening deterioration. Critical care was time spent personally by me on the following activities: development of treatment plan with patient and/or surrogate as well as nursing, discussions with consultants, evaluation of patient's response to treatment, examination of patient, obtaining history from patient or surrogate, ordering and performing treatments and interventions, ordering and review of laboratory studies, ordering and review of radiographic studies, pulse oximetry and re-evaluation of patient's condition.    (including critical care time)  Medical Decision Making / ED Course I have reviewed the nursing notes for this encounter and the patient's prior records (if available in EHR or on provided paperwork).  Bradley Miles was evaluated in Emergency Department on 11/15/2020 for the symptoms described in the history of present illness. He was evaluated in the context of the global COVID-19 pandemic, which necessitated consideration that the patient might be at risk for infection with the SARS-CoV-2 virus that causes COVID-19. Institutional protocols and algorithms that pertain to the evaluation of  patients at risk for COVID-19 are in a state of rapid change based on information released by regulatory bodies including the CDC and federal and state organizations. These policies and algorithms were followed during the patient's care in the ED.  Patient presents with hematemesis in the setting of known esophageal/gastric varices. Still drinking daily.  Fortunately patient's vital signs are stable.  He is minimally tachycardic and normotensive. Abdomen benign.  Two large-bore IVs placed. Patient was given Zofran and IV fluids. Protonix, Rocephin ordered.  Patient's hemoglobin stable at 13. No significant electrolyte derangements.  Patient does have hyperglycemia without evidence of DKA. No renal insufficiency. Chest x-ray without evidence of pneumomediastinum  Patient sees Dr. Gala Romney from [place].  Consulted with his partner Dr. Abbey Chatters, who agreed with starting octreotide.  They do not have privileges here in the hospital so we will consult the on-call gastrologist here at [facility].     Clinical Course as of Nov 16 731  Tue Nov 15, 2020  0650 Spoke with [facility], who agreed with plan and requested patient be admitted to the ICU.  She also requested reaching out to interventional radiology for likely endovascular management.   [PC]  0702 Spoke with interventional radiology who requested CTA BRTO protocol.   [PC]  0705 Called into the patient's room for active hematemesis.  Volume approximately 750 cc of maroon blood.  Patient given Compazine.  Patient became a bit more tachycardic but blood pressure still stable.   [PC]  [id number] Spoke with ICU who will assess patient for admission.   [PC]    Clinical Course User Index [PC] Ronae Noell, Grayce Sessions, MD     Final Clinical Impression(s) / ED Diagnoses Final diagnoses:  Hematemesis      This chart was dictated using voice recognition software.  Despite best efforts to proofread,  errors can occur which can change the  documentation meaning.     Fatima Blank, MD 11/15/20 [phone]

## 2020-11-15 NOTE — Progress Notes (Signed)
  Echocardiogram 2D Echocardiogram has been performed.  Bradley Miles 11/15/2020, 1:39 PM

## 2020-11-16 ENCOUNTER — Encounter (HOSPITAL_COMMUNITY): Payer: Self-pay | Admitting: Gastroenterology

## 2020-11-16 ENCOUNTER — Inpatient Hospital Stay (HOSPITAL_COMMUNITY): Payer: Managed Care, Other (non HMO)

## 2020-11-16 ENCOUNTER — Telehealth: Payer: Self-pay | Admitting: Internal Medicine

## 2020-11-16 DIAGNOSIS — R609 Edema, unspecified: Secondary | ICD-10-CM

## 2020-11-16 DIAGNOSIS — D62 Acute posthemorrhagic anemia: Secondary | ICD-10-CM

## 2020-11-16 DIAGNOSIS — J9601 Acute respiratory failure with hypoxia: Secondary | ICD-10-CM

## 2020-11-16 LAB — BPAM RBC
Blood Product Expiration Date: 202112162359
Blood Product Expiration Date: 202112162359
ISSUE DATE / TIME: 202112071746
ISSUE DATE / TIME: 202112071746
Unit Type and Rh: 600
Unit Type and Rh: 600

## 2020-11-16 LAB — COMPREHENSIVE METABOLIC PANEL
ALT: 12 U/L (ref 0–44)
AST: 32 U/L (ref 15–41)
Albumin: 2.3 g/dL — ABNORMAL LOW (ref 3.5–5.0)
Alkaline Phosphatase: 64 U/L (ref 38–126)
Anion gap: 8 (ref 5–15)
BUN: 20 mg/dL (ref 6–20)
CO2: 26 mmol/L (ref 22–32)
Calcium: 6.8 mg/dL — ABNORMAL LOW (ref 8.9–10.3)
Chloride: 109 mmol/L (ref 98–111)
Creatinine, Ser: 0.88 mg/dL (ref 0.61–1.24)
GFR, Estimated: >60 mL/min (ref >60)
Glucose, Bld: 254 mg/dL — ABNORMAL HIGH (ref 70–99)
Potassium: 4.9 mmol/L (ref 3.5–5.1)
Sodium: 143 mmol/L (ref 135–145)
Total Bilirubin: 4.1 mg/dL — ABNORMAL HIGH (ref 0.3–1.2)
Total Protein: 4.7 g/dL — ABNORMAL LOW (ref 6.5–8.1)

## 2020-11-16 LAB — TYPE AND SCREEN
ABO/RH(D): A NEG
Antibody Screen: NEGATIVE
Unit division: 0
Unit division: 0

## 2020-11-16 LAB — POCT I-STAT 7, (LYTES, BLD GAS, ICA,H+H)
Acid-Base Excess: 3 mmol/L — ABNORMAL HIGH (ref 0.0–2.0)
Bicarbonate: 28.7 mmol/L — ABNORMAL HIGH (ref 20.0–28.0)
Calcium, Ion: 1.07 mmol/L — ABNORMAL LOW (ref 1.15–1.40)
HCT: 24 % — ABNORMAL LOW (ref 39.0–52.0)
Hemoglobin: 8.2 g/dL — ABNORMAL LOW (ref 13.0–17.0)
O2 Saturation: 97 %
Potassium: 4.8 mmol/L (ref 3.5–5.1)
Sodium: 146 mmol/L — ABNORMAL HIGH (ref 135–145)
TCO2: 30 mmol/L (ref 22–32)
pCO2 arterial: 48.3 mmHg — ABNORMAL HIGH (ref 32.0–48.0)
pH, Arterial: 7.383 (ref 7.350–7.450)
pO2, Arterial: 93 mmHg (ref 83.0–108.0)

## 2020-11-16 LAB — HEMOGLOBIN AND HEMATOCRIT, BLOOD
HCT: 26.3 % — ABNORMAL LOW (ref 39.0–52.0)
HCT: 26.7 % — ABNORMAL LOW (ref 39.0–52.0)
Hemoglobin: 8.6 g/dL — ABNORMAL LOW (ref 13.0–17.0)
Hemoglobin: 8.3 g/dL — ABNORMAL LOW (ref 13.0–17.0)

## 2020-11-16 LAB — AMMONIA: Ammonia: 265 umol/L — ABNORMAL HIGH (ref 9–35)

## 2020-11-16 LAB — GLUCOSE, CAPILLARY
Glucose-Capillary: 202 mg/dL — ABNORMAL HIGH (ref 70–99)
Glucose-Capillary: 207 mg/dL — ABNORMAL HIGH (ref 70–99)
Glucose-Capillary: 213 mg/dL — ABNORMAL HIGH (ref 70–99)
Glucose-Capillary: 217 mg/dL — ABNORMAL HIGH (ref 70–99)
Glucose-Capillary: 232 mg/dL — ABNORMAL HIGH (ref 70–99)
Glucose-Capillary: 243 mg/dL — ABNORMAL HIGH (ref 70–99)
Glucose-Capillary: 249 mg/dL — ABNORMAL HIGH (ref 70–99)
Glucose-Capillary: 256 mg/dL — ABNORMAL HIGH (ref 70–99)

## 2020-11-16 LAB — CBC
HCT: 26.8 % — ABNORMAL LOW (ref 39.0–52.0)
Hemoglobin: 8.5 g/dL — ABNORMAL LOW (ref 13.0–17.0)
MCH: 34.6 pg — ABNORMAL HIGH (ref 26.0–34.0)
MCHC: 31.7 g/dL (ref 30.0–36.0)
MCV: 108.9 fL — ABNORMAL HIGH (ref 80.0–100.0)
Platelets: 53 10*3/uL — ABNORMAL LOW (ref 150–400)
RBC: 2.46 MIL/uL — ABNORMAL LOW (ref 4.22–5.81)
RDW: 20.1 % — ABNORMAL HIGH (ref 11.5–15.5)
WBC: 5.8 10*3/uL (ref 4.0–10.5)
nRBC: 0 % (ref 0.0–0.2)

## 2020-11-16 LAB — MAGNESIUM
Magnesium: 1.1 mg/dL — ABNORMAL LOW (ref 1.7–2.4)
Magnesium: 1.7 mg/dL (ref 1.7–2.4)

## 2020-11-16 LAB — TRIGLYCERIDES: Triglycerides: 103 mg/dL (ref <150)

## 2020-11-16 MED ORDER — FUROSEMIDE 10 MG/ML IJ SOLN
40.0000 mg | Freq: Once | INTRAMUSCULAR | Status: AC
  Administered 2020-11-16: 40 mg via INTRAVENOUS
  Filled 2020-11-16: qty 4

## 2020-11-16 MED ORDER — MAGNESIUM SULFATE 4 GM/100ML IV SOLN
4.0000 g | Freq: Once | INTRAVENOUS | Status: AC
  Administered 2020-11-16: 4 g via INTRAVENOUS
  Filled 2020-11-16: qty 100

## 2020-11-16 MED ORDER — DEXMEDETOMIDINE HCL IN NACL 400 MCG/100ML IV SOLN
0.0000 ug/kg/h | INTRAVENOUS | Status: DC
  Administered 2020-11-16: 0.4 ug/kg/h via INTRAVENOUS
  Administered 2020-11-17: 0.3 ug/kg/h via INTRAVENOUS
  Filled 2020-11-16 (×2): qty 100

## 2020-11-16 MED ORDER — CHLORHEXIDINE GLUCONATE 0.12% ORAL RINSE (MEDLINE KIT)
15.0000 mL | Freq: Two times a day (BID) | OROMUCOSAL | Status: DC
  Administered 2020-11-16 – 2020-11-17 (×3): 15 mL via OROMUCOSAL

## 2020-11-16 MED ORDER — ALBUTEROL SULFATE (2.5 MG/3ML) 0.083% IN NEBU: 2.5000 mg | INHALATION_SOLUTION | RESPIRATORY_TRACT | Status: DC | PRN

## 2020-11-16 MED ORDER — INSULIN ASPART 100 UNIT/ML ~~LOC~~ SOLN
0.0000 [IU] | SUBCUTANEOUS | Status: DC
  Administered 2020-11-16 – 2020-11-17 (×5): 5 [IU] via SUBCUTANEOUS
  Administered 2020-11-17: 2 [IU] via SUBCUTANEOUS
  Administered 2020-11-17: 5 [IU] via SUBCUTANEOUS
  Administered 2020-11-17: 3 [IU] via SUBCUTANEOUS
  Administered 2020-11-17: 2 [IU] via SUBCUTANEOUS
  Administered 2020-11-17: 3 [IU] via SUBCUTANEOUS

## 2020-11-16 MED ORDER — ALBUMIN HUMAN 5 % IV SOLN
12.5000 g | Freq: Once | INTRAVENOUS | Status: AC
  Administered 2020-11-16: 12.5 g via INTRAVENOUS
  Filled 2020-11-16: qty 250

## 2020-11-16 MED ORDER — INSULIN DETEMIR 100 UNIT/ML ~~LOC~~ SOLN
5.0000 [IU] | Freq: Once | SUBCUTANEOUS | Status: AC
  Administered 2020-11-16: 5 [IU] via SUBCUTANEOUS
  Filled 2020-11-16: qty 0.05

## 2020-11-16 MED ORDER — MAGNESIUM SULFATE 2 GM/50ML IV SOLN: 2.0000 g | Freq: Once | INTRAVENOUS | Status: AC

## 2020-11-16 MED ORDER — ORAL CARE MOUTH RINSE
15.0000 mL | OROMUCOSAL | Status: DC
  Administered 2020-11-17 (×8): 15 mL via OROMUCOSAL

## 2020-11-16 MED ORDER — LACTULOSE 10 GM/15ML PO SOLN: 20.0000 g | Freq: Two times a day (BID) | ORAL | Status: DC

## 2020-11-16 MED ORDER — INSULIN DETEMIR 100 UNIT/ML ~~LOC~~ SOLN
5.0000 [IU] | Freq: Two times a day (BID) | SUBCUTANEOUS | Status: DC
  Administered 2020-11-16: 5 [IU] via SUBCUTANEOUS
  Filled 2020-11-16 (×2): qty 0.05

## 2020-11-16 MED ORDER — INSULIN DETEMIR 100 UNIT/ML ~~LOC~~ SOLN
10.0000 [IU] | Freq: Two times a day (BID) | SUBCUTANEOUS | Status: DC
  Administered 2020-11-16: 10 [IU] via SUBCUTANEOUS
  Filled 2020-11-16 (×3): qty 0.1

## 2020-11-16 MED ORDER — FOLIC ACID 5 MG/ML IJ SOLN
1.0000 mg | Freq: Every day | INTRAMUSCULAR | Status: DC
  Administered 2020-11-16 – 2020-11-17 (×2): 1 mg via INTRAVENOUS
  Filled 2020-11-16 (×3): qty 0.2

## 2020-11-16 NOTE — Progress Notes (Addendum)
Daily Rounding Note  11/16/2020, 10:04 AM  LOS: 1 day   SUBJECTIVE:   Chief complaint:  Hematemesis.  Bleeding gastric varices.  Anemia.       Staff reports 1 bloody BM overnight.  Remains intubated but plans are for extubation today.    OBJECTIVE:         Vital signs in last 24 hours:    Temp:  [98.1 F (36.7 C)-99 F (37.2 C)] 98.6 F (37 C) (12/08 0800) Pulse Rate:  [74-123] 83 (12/08 0900) Resp:  [16-24] 24 (12/08 0900) BP: (100-152)/(52-107) 109/60 (12/08 0900) SpO2:  [94 %-100 %] 94 % (12/08 0900) Arterial Line BP: (78-154)/(40-74) 109/54 (12/08 0900) FiO2 (%):  [60 %-100 %] 60 % (12/08 0800) Weight:  [622 kg] 128 kg (12/08 0600) Last BM Date: 11/15/20 Filed Weights   11/16/20 0600  Weight: 128 kg   General: sedated, unresponsive to voice and exam. Obese.     Heart: RRR Chest: no labored breathing on vent Abdomen: Soft, nontender.  Active bowel sounds.  Obese. Extremities: No CCE.  Feet are warm. Derm:   Small telangiectasia on upper chest Neuro/Psych: Left arm wearing restraint mitten.  No tremors.  Sedated.  Did not attempt to wake him up using noxious stimuli.  Intake/Output from previous day: 12/07 0701 - 12/08 0700 In: 7304.1 [I.V.:4569.1; Blood:635; IV Piggyback:2100] Out: 2150 [Urine:2150]  Intake/Output this shift: Total I/O In: 108 [I.V.:108] Out: -   Lab Results: Recent Labs    11/15/20 0500 11/15/20 0955 11/15/20 2148 11/16/20 0455 11/16/20 0830  WBC 7.5  --   --  5.8  --   HGB 13.2   < > 9.1* 8.5* 8.2*  HCT 39.1   < > 28.6* 26.8* 24.0*  PLT PLATELET CLUMPS NOTED ON SMEAR, UNABLE TO ESTIMATE  --   --  53*  --    < > = values in this interval not displayed.   BMET Recent Labs    11/15/20 0500 11/15/20 0500 11/15/20 1109 11/15/20 1434 11/15/20 2148 11/16/20 0455 11/16/20 0830  NA 140   < > 143   < > 142 143 146*  K 4.5   < > 5.7*   < > 4.9 4.9 4.8  CL 104   < > 107  --   110 109  --   CO2 27  --   --   --  26 26  --   GLUCOSE 258*   < > 190*  --  235* 254*  --   BUN 12   < > 16  --  15 20  --   CREATININE 0.90   < > 0.90  --  0.77 0.88  --   CALCIUM 8.3*  --   --   --  6.9* 6.8*  --    < > = values in this interval not displayed.   LFT Recent Labs    11/15/20 0500 11/16/20 0455  PROT 6.2* 4.7*  ALBUMIN 2.5* 2.3*  AST 38 32  ALT 18 12  ALKPHOS 111 64  BILITOT 2.6* 4.1*   PT/INR Recent Labs    11/15/20 0955  LABPROT 21.3*  INR 1.9*    Studies/Results: IR Venogram Renal Uni Left IR US Guide Vasc Access Right IR Fluoro Guide CV Line Right  Result Date: 11/16/2020 CLINICAL DATA:  54 year old male with history of alcoholic cirrhosis and upper gastrointestinal hemorrhage status post balloon and coil assisted retrograde transvenous obliteration  of gastric varices on 11/22/2019 followed by TIPS creation on 11/24/2019 for continued hemorrhage. The patient presents with acute large volume hematemesis and CT evidence of patent gastric varicosities. EXAM: 1. Ultrasound and fluoroscopic guided central venous catheter placement. 2. Ultrasound-guided right common femoral venous access. 3. Selective catheterization of gastro renal shunt. 4. Gastric variceal venogram. 5. Coil embolization of posterior branch of bifurcated gastro renal shunt. 6. Retrograde sclerotherapy of gastric varices assisted with coil embolization. MEDICATIONS: As per anesthesia record. ANESTHESIA/SEDATION: General - as administered by the Anesthesia department CONTRAST:  220 mL Omnipaque 300, intravenous FLUOROSCOPY TIME:  Fluoroscopy Time: 61 minutes 36 seconds (3,013 mGy). COMPLICATIONS: None immediate. PROCEDURE: Informed written consent was obtained from the patient's wife after a thorough discussion of the procedural risks, benefits and alternatives. All questions were addressed. Maximal Sterile Barrier Technique was utilized including caps, mask, sterile gowns, sterile gloves, sterile  drape, hand hygiene and skin antiseptic. A timeout was performed prior to the initiation of the procedure. The right neck was prepped and draped in standard fashion. Ultrasound evaluation demonstrated patent internal jugular vein which was compressible and free of thrombus. A small skin nick was made. 21 gauge micropuncture needle was inserted under direct ultrasound visualization into the internal jugular vein. A micro puncture kit wire was advanced to the level of the right atrium. The micropuncture sheath was introduced and inner dilator and wire removed. A 0.035 inch J-wire was placed to level of the right atrium, sero dilation was performed followed by placement of an 8 French triple-lumen, 20 cm catheter. The lumens flushed and aspirated with ease. The catheter tip was confirmed fluoroscopically to be located at the cavoatrial junction. The catheter was sutured in place and the lines were turned over to Anesthesia for venous access during the procedure. The right groin was then prepped and draped in standard fashion. Ultrasound evaluation demonstrated a patent right common femoral vein. A small skin nick was made at the planned needle entry site. The right common femoral vein was accessed with a 21 gauge micropuncture needle. A Rosen wire was directed to the inferior vena cava. Serial dilation was performed and a 10 French tips sheath was inserted. A 5 French C2 catheter was directed over the [facility] wire and retracted until gaining access to the left renal vein ostium. The Rosen wire was then reinserted and position in an inferior branch of the left renal vein. Limited left renal venogram demonstrated patency and brisk outflow into the inferior vena cava. A Kumpe the catheter was then placed through the sheath adjacent to the indwelling safety wire and directed into the gastro renal shunt. Venogram demonstrated patent gastric varices with outflow into the gastro renal shunt. Balloon occlusion venogram via a 5  [place] Fogarty was then performed which demonstrated prominent nidus of gastric varices measuring approximately 4 cm by 1.6 cm with multiple gastric draining veins. There is no significant outflow or reflux into the left gastric, posterior gastric, or short gastric veins. Repeat venogram was performed in a left anterior oblique which demonstrated bifurcation of the gastro renal shunt with a smaller, posterior branch. The indwelling safety catheter was removed and a Progreat Omega microcatheter was advanced into the posterior branch of the spleno renal shunt. Venogram was performed confirming position just inferior to the previously placed coil pack. An assortment of Penumbra detachable of microcoils were then inserted into the superior aspect of this draining vein to prevent outflow of planned sclerosis. Next, sclerosing of the large network of gastric varices  was not pursued. Multiple Fogarty balloon catheters were attempted to occlude the gastro renal shunt which was unsuccessful due to spontaneous rupture of the balloons. Therefore, an 8 [place] Merci balloon occlusion guide catheter was positioned in the inferior aspect of the main channel of the gastro renal shunt with adequate balloon occlusion confirmed by venogram which again demonstrated no significant outflow and adequate embolization of the bifurcated splenorenal shunt branch. A solution of lipiodol, Sotradecol, and room air (1:2:3 ratio) was prepared into a sclerosing foam. A Progreat Omega microcatheter was inserted through the indwelling occlusion balloon into the varicosities. Under fluoroscopic visualization, the sclerosing admixture was administered slowly. The sclerosing solution filled the largest nidus of gastric varix. There is suggestion of a small amount of sclerosing solution traveling to the fundal gastric veins, which was decided to be the stopping point of sclerosis. The catheter was flushed. Next, an assortment of Penmbra detachable  microcoils were delivered through the indwelling microcatheter to embolize the gastro renal shunt outflow. The occlusion balloon was slowly deflated under direct fluoroscopic visualization without evidence of coil or contrast migration. Completion venogram was performed through the indwelling tips sheath in the left renal ostium which demonstrated brisk renal vein outflow but sluggish outflow from the gastro renal shunt, suggestive of adequate sclerosis and embolization. The sheath was then removed and hemostasis was obtained with manual compression. The patient tolerated the procedure well was transferred back to the ICU intubated and sedated. IMPRESSION: 1. Technically successful balloon and coil assisted retrograde transvenous obliteration of recanalized gastric varices in the setting of massive hematemesis with associated hemorrhagic shock. 2. Placement of right internal jugular triple-lumen central venous catheter (20 cm) with the catheter tip at the cavoatrial junction. The catheter is ready for immediate use. PLAN: Continue close monitoring of hemodynamics status. Expect continued melena for several days. Interventional Radiology will continue to follow while inpatient, and arrange for outpatient follow-up visit in 1 month. Ruthann Cancer, MD [facility] Radiology Electronically Signed   By: Ruthann Cancer MD   On: 11/16/2020 09:21     Result Date: 11/16/2020 CLINICAL DATA:  54 year old male with history of alcoholic cirrhosis and upper gastrointestinal hemorrhage status post balloon and coil assisted retrograde transvenous obliteration of gastric varices on 11/22/2019 followed by TIPS creation on 11/24/2019 for continued hemorrhage. The patient presents with acute large volume hematemesis and CT evidence of patent gastric varicosities. EXAM: 1. Ultrasound and fluoroscopic guided central venous catheter placement. 2. Ultrasound-guided right common femoral  venous access. 3. Selective catheterization of gastro renal shunt. 4. Gastric variceal venogram. 5. Coil embolization of posterior branch of bifurcated gastro renal shunt. 6. Retrograde sclerotherapy of gastric varices assisted with coil embolization. MEDICATIONS: As per anesthesia record. ANESTHESIA/SEDATION: General - as administered by the Anesthesia department CONTRAST:  220 mL Omnipaque 300, intravenous FLUOROSCOPY TIME:  Fluoroscopy Time: 61 minutes 36 seconds (3,013 mGy). COMPLICATIONS: None immediate. PROCEDURE: Informed written consent was obtained from the patient's wife after a thorough discussion of the procedural risks, benefits and alternatives. All questions were addressed. Maximal Sterile Barrier Technique was utilized including caps, mask, sterile gowns, sterile gloves, sterile drape, hand hygiene and skin antiseptic. A timeout was performed prior to the initiation of the procedure. The right neck was prepped and draped in standard fashion. Ultrasound evaluation demonstrated patent internal jugular vein which was compressible and free of thrombus. A small skin nick was made. 21 gauge micropuncture needle was inserted under direct ultrasound visualization into the internal jugular vein.  A micro puncture kit wire was advanced to the level of the right atrium. The micropuncture sheath was introduced and inner dilator and wire removed. A 0.035 inch J-wire was placed to level of the right atrium, sero dilation was performed followed by placement of an 8 French triple-lumen, 20 cm catheter. The lumens flushed and aspirated with ease. The catheter tip was confirmed fluoroscopically to be located at the cavoatrial junction. The catheter was sutured in place and the lines were turned over to Anesthesia for venous access during the procedure. The right groin was then prepped and draped in standard fashion. Ultrasound evaluation demonstrated a patent right common femoral vein. A small skin nick was made at the  planned needle entry site. The right common femoral vein was accessed with a 21 gauge micropuncture needle. A Rosen wire was directed to the inferior vena cava. Serial dilation was performed and a 10 French tips sheath was inserted. A 5 French C2 catheter was directed over the [facility] wire and retracted until gaining access to the left renal vein ostium. The Rosen wire was then reinserted and position in an inferior branch of the left renal vein. Limited left renal venogram demonstrated patency and brisk outflow into the inferior vena cava. A Kumpe the catheter was then placed through the sheath adjacent to the indwelling safety wire and directed into the gastro renal shunt. Venogram demonstrated patent gastric varices with outflow into the gastro renal shunt. Balloon occlusion venogram via a 5 [place] Fogarty was then performed which demonstrated prominent nidus of gastric varices measuring approximately 4 cm by 1.6 cm with multiple gastric draining veins. There is no significant outflow or reflux into the left gastric, posterior gastric, or short gastric veins. Repeat venogram was performed in a left anterior oblique which demonstrated bifurcation of the gastro renal shunt with a smaller, posterior branch. The indwelling safety catheter was removed and a Progreat Omega microcatheter was advanced into the posterior branch of the spleno renal shunt. Venogram was performed confirming position just inferior to the previously placed coil pack. An assortment of Penumbra detachable of microcoils were then inserted into the superior aspect of this draining vein to prevent outflow of planned sclerosis. Next, sclerosing of the large network of gastric varices was not pursued. Multiple Fogarty balloon catheters were attempted to occlude the gastro renal shunt which was unsuccessful due to spontaneous rupture of the balloons. Therefore, an 8 [place] Merci balloon occlusion guide catheter was positioned in the inferior aspect of  the main channel of the gastro renal shunt with adequate balloon occlusion confirmed by venogram which again demonstrated no significant outflow and adequate embolization of the bifurcated splenorenal shunt branch. A solution of lipiodol, Sotradecol, and room air (1:2:3 ratio) was prepared into a sclerosing foam. A Progreat Omega microcatheter was inserted through the indwelling occlusion balloon into the varicosities. Under fluoroscopic visualization, the sclerosing admixture was administered slowly. The sclerosing solution filled the largest nidus of gastric varix. There is suggestion of a small amount of sclerosing solution traveling to the fundal gastric veins, which was decided to be the stopping point of sclerosis. The catheter was flushed. Next, an assortment of Penmbra detachable microcoils were delivered through the indwelling microcatheter to embolize the gastro renal shunt outflow. The occlusion balloon was slowly deflated under direct fluoroscopic visualization without evidence of coil or contrast migration. Completion venogram was performed through the indwelling tips sheath in the left renal ostium which demonstrated brisk renal vein outflow but sluggish outflow from the gastro renal  shunt, suggestive of adequate sclerosis and embolization. The sheath was then removed and hemostasis was obtained with manual compression. The patient tolerated the procedure well was transferred back to the ICU intubated and sedated. IMPRESSION: 1. Technically successful balloon and coil assisted retrograde transvenous obliteration of recanalized gastric varices in the setting of massive hematemesis with associated hemorrhagic shock. 2. Placement of right internal jugular triple-lumen central venous catheter (20 cm) with the catheter tip at the cavoatrial junction. The catheter is ready for immediate use. PLAN: Continue close monitoring of hemodynamics status. Expect continued melena for several days. Interventional  Radiology will continue to follow while inpatient, and arrange for outpatient follow-up visit in 1 month. Ruthann Cancer, MD [facility] Radiology Electronically Signed   By: Ruthann Cancer MD   On: 11/16/2020 09:21   DG Chest Port 1 View  Result Date: 11/16/2020 CLINICAL DATA:  Respiratory failure. EXAM: PORTABLE CHEST 1 VIEW COMPARISON:  11/15/2020 FINDINGS: Endotracheal tube is 4.8 cm above the carina. Right jugular central line in the SVC region. Negative for pneumothorax. Improved aeration in the left lung but residual opacity in the retrocardiac space and along the left hemidiaphragm. Findings are compatible with known volume loss. Heart size is within normal limits. Again noted are multiple embolization coils in the upper abdomen. IMPRESSION: 1. Improving aeration at the left lung base. Residual retrocardiac opacities compatible with known volume loss. 2. Interval placement of a right jugular central line. Catheter tip is in the SVC region. Negative for pneumothorax. 3. Endotracheal tube is appropriately positioned. Electronically Signed   By: Markus Daft M.D.   On: 11/16/2020 08:47   DG CHEST PORT 1 VIEW  Result Date: 11/15/2020 CLINICAL DATA:  Status post intubation. EXAM: PORTABLE CHEST 1 VIEW COMPARISON:  Chest x-ray 11/15/2020 FINDINGS: The endotracheal tube is 5 cm above the carina. Interval significant change in the mediastinal contour since earlier film. Findings worrisome for hemorrhage/hematoma. There is also a new large left pleural effusion. Findings worrisome for hemothorax. Would be concerned about variceal rupture/bleeding. IMPRESSION: 1. Endotracheal tube in good position. 2. New large left pleural effusion and significant change in mediastinal contour worrisome for hemorrhage/hematoma. Suspect variceal rupture/bleeding. These results were called by telephone at the time of interpretation on 11/15/2020 at 12:12 pm to the patient's ICU nurse,  April, who verbally acknowledged these results. She was going to notify Dr. Ruthann Cancer. Electronically Signed   By: Marijo Sanes M.D.   On: 11/15/2020 12:17   DG Chest Port 1 View  Result Date: 11/15/2020 CLINICAL DATA:  Hematemesis since last evening. EXAM: PORTABLE CHEST 1 VIEW COMPARISON:  Chest x-ray 08/05/2020 FINDINGS: The cardiac silhouette, mediastinal hilar contours are within normal limits. The lungs are clear. No pleural effusions. No pulmonary lesions. No pneumothorax. The bony thorax is intact. IMPRESSION: No acute cardiopulmonary findings. Electronically Signed   By: Marijo Sanes M.D.   On: 11/15/2020 06:46   CT ANGIO CHEST AORTA W/CM &/OR WO/CM  Result Date: 11/15/2020 CLINICAL DATA:  54 year old with recent intubation and hematemesis. Concern for hemorrhage in the chest. EXAM: CT ANGIOGRAPHY CHEST WITH CONTRAST TECHNIQUE: Multidetector CT imaging of the chest was performed using the standard protocol during bolus administration of intravenous contrast. Multiplanar CT image reconstructions and MIPs were obtained to evaluate the vascular anatomy. CONTRAST:  24m OMNIPAQUE IOHEXOL 350 MG/ML SOLN COMPARISON:  Chest radiograph 11/15/2020 and CT a abdomen 11/15/2020 FINDINGS: Cardiovascular: Normal caliber of the thoracic aorta with mild atherosclerotic disease. No evidence for  aortic dissection. Suboptimal evaluation of the pulmonary arteries but no evidence for a large central pulmonary embolism. Heart size is normal. No significant pericardial fluid. Mediastinum/Nodes: Mediastinal shift towards the left related to volume loss in the left lung. There is no evidence for a mediastinal hematoma. No significant chest lymphadenopathy. No axillary lymph node enlargement. Lungs/Pleura: There is no significant pericardial fluid. No evidence for a hemothorax. Complete collapse of the left lower lobe which is new from the recent abdominal CT. Few air bronchograms in the left lower lobe. Findings could be  related to aspiration or mucous plugging with poorly defined filling defect in the proximal left lower lobe bronchus. Few patchy densities along the posterior aspect the left upper lobe. 2 mm nodule in the right upper lobe on sequence 6, image 64. Some focal thickening or atelectasis along the right major fissure on image 63. Nodular density in the right lung apex on sequence 6 image 34 measures roughly 4 mm. No significant airspace disease, consolidation or volume loss in the right lung. Negative for pneumothorax. Endotracheal tube is appropriately positioned above the carina. Upper Abdomen: Images of the upper abdomen again demonstrate a TIPS stent and embolization coils in the upper abdomen. Patient has perihepatic ascites. Stomach appears to be distended with high-density material that may represent blood products based on history. Musculoskeletal: No acute bone abnormality. Review of the MIP images confirms the above findings. IMPRESSION: 1. Complete collapse of the left lower lobe. This lobar collapse represents the opacities and changes seen on the recent chest radiograph. Left lower lobe collapse may be related to mucous plugging or aspiration. 2. No evidence for a hemothorax or mediastinal hematoma. No significant pleural fluid. 3. Limited evaluation for pulmonary embolism due to technical issues. However, there is no evidence for a large central pulmonary embolism. No acute abnormality of the thoracic aorta. 4.  Aortic Atherosclerosis (ICD10-I70.0). Electronically Signed   By: Markus Daft M.D.   On: 11/15/2020 14:50   ECHOCARDIOGRAM COMPLETE  Result Date: 11/15/2020    ECHOCARDIOGRAM REPORT   Patient Name:   Bradley Miles Date of Exam: 11/15/2020 Medical Rec #:  [id number]       Height:       75.0 in Accession #:    [id number]      Weight:       263.3 lb Date of Birth:  Dec 21, 1965      BSA:          2.466 m Patient Age:    26 years        BP:           152/88 mmHg Patient Gender: M               HR:            110 bpm. Exam Location:  Inpatient Procedure: 2D Echo, Cardiac Doppler, Color Doppler and Intracardiac            Opacification Agent STAT ECHO Indications:    Pericardial effusion [[id number]]  History:        Patient has prior history of Echocardiogram examinations, most                 recent 06/02/2018. Arrythmias:PVC, Signs/Symptoms:Shortness of                 Breath; Risk Factors:Current Smoker, Hypertension and Diabetes.                 GERD. Palpitations.  Sonographer:  Vickie Epley RDCS Referring Phys: [id number] [place] MARSHALL  Sonographer Comments: Echo performed with patient supine and on artificial respirator. IMPRESSIONS  1. Left ventricular ejection fraction, by estimation, is 60 to 65%. The left ventricle has normal function. The left ventricle has no regional wall motion abnormalities. Left ventricular diastolic parameters are consistent with Grade I diastolic dysfunction (impaired relaxation). Left ventricular filling pressures are probably abnormally low.  2. Right ventricular systolic function is normal. The right ventricular size is normal.  3. The mitral valve is normal in structure. No evidence of mitral valve regurgitation. No evidence of mitral stenosis.  4. The aortic valve is normal in structure. Aortic valve regurgitation is not visualized. No aortic stenosis is present.  5. The inferior vena cava is normal in size with greater than 50% respiratory variability, suggesting right atrial pressure of 3 mmHg. Conclusion(s)/Recommendation(s): The inferior vena cava is remarkably small for a patient on positive pressure ventilation. Together with mitral inflow pattern of abnormal relaxation (with normal tissue Doppler velocities) and hyperdynamic LV contraction, this raises concern for hypovolemia. FINDINGS  Left Ventricle: Left ventricular ejection fraction, by estimation, is 60 to 65%. The left ventricle has normal function. The left ventricle has no regional wall motion abnormalities.  Definity contrast agent was given IV to delineate the left ventricular  endocardial borders. The left ventricular internal cavity size was normal in size. There is no left ventricular hypertrophy. Left ventricular diastolic parameters are consistent with Grade I diastolic dysfunction (impaired relaxation). Left ventricular filling pressures are probably abnormally low. Right Ventricle: The right ventricular size is normal. No increase in right ventricular wall thickness. Right ventricular systolic function is normal. Left Atrium: Left atrial size was normal in size. Right Atrium: Right atrial size was normal in size. Pericardium: There is no evidence of pericardial effusion. Mitral Valve: The mitral valve is normal in structure. No evidence of mitral valve regurgitation. No evidence of mitral valve stenosis. Tricuspid Valve: The tricuspid valve is normal in structure. Tricuspid valve regurgitation is not demonstrated. No evidence of tricuspid stenosis. Aortic Valve: The aortic valve is normal in structure. Aortic valve regurgitation is not visualized. No aortic stenosis is present. Pulmonic Valve: The pulmonic valve was normal in structure. Pulmonic valve regurgitation is not visualized. No evidence of pulmonic stenosis. Aorta: The aortic root is normal in size and structure. Venous: The inferior vena cava is normal in size with greater than 50% respiratory variability, suggesting right atrial pressure of 3 mmHg. IAS/Shunts: No atrial level shunt detected by color flow Doppler.  LEFT VENTRICLE PLAX 2D LVIDd:         5.00 cm      Diastology LVIDs:         3.30 cm      LV e' medial:    8.24 cm/s LV PW:         0.90 cm      LV E/e' medial:  7.6 LV IVS:        0.90 cm      LV e' lateral:   12.40 cm/s LVOT diam:     2.50 cm      LV E/e' lateral: 5.1 LV SV:         93 LV SV Index:   38 LVOT Area:     4.91 cm  LV Volumes (MOD) LV vol d, MOD A2C: 126.0 ml LV vol d, MOD A4C: 126.0 ml LV vol s, MOD A2C: 30.5 ml LV vol s, MOD  A4C: 30.8 ml  LV SV MOD A2C:     95.5 ml LV SV MOD A4C:     126.0 ml LV SV MOD BP:      98.9 ml RIGHT VENTRICLE RV S prime:     16.20 cm/s TAPSE (M-mode): 2.5 cm LEFT ATRIUM             Index       RIGHT ATRIUM           Index LA diam:        3.70 cm 1.50 cm/m  RA Area:     14.80 cm LA Vol (A2C):   41.9 ml 16.99 ml/m RA Volume:   36.60 ml  14.84 ml/m LA Vol (A4C):   40.4 ml 16.38 ml/m LA Biplane Vol: 43.1 ml 17.47 ml/m  AORTIC VALVE LVOT Vmax:   123.00 cm/s LVOT Vmean:  91.500 cm/s LVOT VTI:    0.190 m  AORTA Ao Root diam: 3.70 cm MITRAL VALVE MV Area (PHT): 4.63 cm     SHUNTS MV Decel Time: 164 msec     Systemic VTI:  0.19 m MV E velocity: 63.00 cm/s   Systemic Diam: 2.50 cm MV A velocity: 103.00 cm/s MV E/A ratio:  0.61 Mihai Croitoru MD Electronically signed by Sanda Klein MD Signature Date/Time: 11/15/2020/1:53:18 PM    Final    US LIVER DOPPLER  Result Date: 11/16/2020 CLINICAL DATA:  54 year old with hematemesis and suspect recurrent gastric variceal bleeding. Patient has had a BRTO procedure and TIPS procedure. Recent repeat BRTO procedure. Evaluate patency of the TIPS stent. EXAM: DUPLEX ULTRASOUND OF LIVER AND TIPS SHUNT TECHNIQUE: Color and duplex Doppler ultrasound was performed to evaluate the hepatic in-flow and out-flow vessels. COMPARISON:  Liver duplex 10/24/2020 FINDINGS: Portal Vein Velocities Main:  48 cm/sec Right:  16 cm/sec Left:  19 cm/sec TIPS Stent Velocities Proximal-portal: 18 cm/sec Mid:  48 Distal-hepatic: 20 cm/sec IVC: Patent with normal phasicity. Hepatic Vein Velocities Right:  22 cm/sec Mid:  23 cm/sec Left:  33 cm/sec Splenic Vein: 27 cm/sec Superior Mesenteric Vein: 13 cm/sec Hepatic Artery: 93 cm/sec Ascities: Present Varices: Not visualized Hepatofugal flow in the right portal vein. Difficult to evaluate the flow direction in the left portal vein. TIPS stent is patent but the velocities have decreased compared to the previous examination. IMPRESSION: 1. TIPS stent is  patent.  Main portal veins are patent. 2. Perihepatic ascites. Electronically Signed   By: Markus Daft M.D.   On: 11/16/2020 08:41   VAS [place] LOWER EXTREMITY VENOUS (DVT)  Result Date: 11/16/2020 Summary: RIGHT: - No evidence of common femoral vein obstruction.  LEFT: - Findings appear essentially unchanged compared to previous examination. - There is no evidence of deep vein thrombosis in the lower extremity.  *See table(s) above for measurements and observations.    Preliminary    [place] EKG SITE RITE  Result Date: 11/15/2020 If Site Rite image not attached, placement could not be confirmed due to current cardiac rhythm.  CT Angio Abd/Pel w/ and/or w/o  Result Date: 11/15/2020 CLINICAL DATA:  54 year old with cirrhosis with prior BTRO and TIPS procedure. Patient presents with hematemesis. EXAM: CTA ABDOMEN AND PELVIS WITHOUT AND WITH CONTRAST TECHNIQUE: Multidetector CT imaging of the abdomen and pelvis was performed using the standard protocol during bolus administration of intravenous contrast. Multiplanar reconstructed images and MIPs were obtained and reviewed to evaluate the vascular anatomy. CONTRAST:  167m OMNIPAQUE IOHEXOL 350 MG/ML SOLN COMPARISON:  Ultrasound 10/24/2020 and CTA 08/05/2020 FINDINGS: VASCULAR Aorta:  Mild atherosclerotic disease in the abdominal aorta without aneurysm or dissection. Celiac: Celiac trunk is patent. Limited evaluation of the celiac artery branches due to artifact from the embolization coils. However, splenic artery and hepatic arteries are patent. Incidentally, there is a replaced left hepatic artery coming off the left gastric artery. Left gastric artery originates directly from the abdominal aorta. SMA: Patent without evidence of aneurysm, dissection, vasculitis or significant stenosis. Renals: Bilateral renal arteries are widely patent without aneurysm or dissection. Small accessory renal arteries bilaterally. IMA: Patent. Inflow: Atherosclerotic disease involving  the bilateral iliac arteries. External, internal and common iliac arteries are patent bilaterally. Proximal Outflow: Proximal femoral arteries are patent bilaterally. Veins: TIPS stent has a stable configuration and position. There appears to be flow within the stent on the delayed images but this could be better characterized with ultrasound. Multiple embolization coils in the upper abdomen related to variceal embolization. Evidence for varices near the gastric cardia best seen on sequence 7, image 21. Although the patient has had a BRTO procedure, the gastro renal shunt is still patent near the renal vein. No significant varices in the distal esophageal region. IVC and renal veins are patent. Iliac veins are patent. Review of the MIP images confirms the above findings. NON-VASCULAR Lower chest: Dependent densities in both lower lobes are suggestive for atelectasis. No large pleural effusions. Hepatobiliary: Liver has a mildly nodular contour and compatible with cirrhosis. No focal liver lesion. Evidence for stones at the base of the gallbladder without gallbladder distention or inflammatory changes. Small amount of perihepatic ascites. Pancreas: Unremarkable. No pancreatic ductal dilatation or surrounding inflammatory changes. Spleen: Spleen is mildly prominent for size measuring roughly 13.7 cm in length. Small amount of ascites in the left upper quadrant. No focal splenic lesion. Adrenals/Urinary Tract: Normal appearance of the adrenal glands. Normal appearance of both kidneys. No hydronephrosis. No suspicious renal lesions. Normal appearance of the urinary bladder. Stomach/Bowel: Diffuse wall thickening involving the rectum and distal sigmoid colon. Mild wall thickening throughout the colon. No evidence for small bowel dilatation. No evidence for a bowel obstruction. The stomach is distended with air-fluid level. High-density material along the dependent aspect of the stomach could represent blood products.  Proximal duodenum is distended. Lymphatic: No significant lymph node enlargement in the abdomen or pelvis. Reproductive: Prostate is unremarkable. Other: Trace pelvic ascites with presacral edema. Small amount of ascites in the abdomen, mostly around the liver. Mild mesenteric edema. Musculoskeletal: Bilateral pars defects at L5 without significant anterolisthesis. IMPRESSION: VASCULAR 1. Stable appearance of the TIPS stent. The stent appears to be patent based on the venous phase imaging but this could be better characterized with a dedicated liver duplex ultrasound. 2. Multiple variceal embolization coils. However, there is evidence for residual varices at the gastric cardia region. Despite having had a BRTO procedure, there appears to be residual flow in a gastro renal shunt. Gastro renal shunt may be supplying the gastric cardia varices. 3. No evidence for active arterial bleeding. NON-VASCULAR 1. Cirrhosis with a small amount of ascites. Spleen is prominent for size. 2. Diffuse wall thickening in the colon, most prominent in the rectum and distal sigmoid colon region. Findings are concerning for colitis. Etiology is uncertain but this does represent a change since 08/05/2020. 3. Cholelithiasis. Electronically Signed   By: Markus Daft M.D.   On: 11/15/2020 09:08    Scheduled Meds: . sodium chloride   Intravenous Once  . Chlorhexidine Gluconate Cloth  6 each Topical Daily  . docusate  100 mg Per Tube BID  . fentaNYL (SUBLIMAZE) injection  50 mcg Intravenous Once  . folic acid  1 mg Intravenous Daily  . furosemide  40 mg Intravenous Once  . insulin aspart  0-15 Units Subcutaneous Q4H  . insulin detemir  5 Units Subcutaneous BID  . multivitamin with minerals  1 tablet Oral Daily  . polyethylene glycol  17 g Per Tube Daily  . sodium chloride flush  10-40 mL Intracatheter Q12H  . thiamine  100 mg Oral Daily   Or  . thiamine  100 mg Intravenous Daily   Continuous Infusions: . cefTRIAXone (ROCEPHIN)   IV 1 g (11/16/20 3419)  . dexmedetomidine (PRECEDEX) IV infusion Stopped (11/16/20 0830)  . fentaNYL infusion INTRAVENOUS 200 mcg/hr (11/16/20 0440)  . magnesium sulfate bolus IVPB     Followed by  . magnesium sulfate bolus IVPB    . octreotide  (SANDOSTATIN)    IV infusion 50 mcg/hr (11/16/20 0435)  . pantoprozole (PROTONIX) infusion 8 mg/hr (11/16/20 0945)  . pantoprazole (PROTONIX) 80 mg IVPB     PRN Meds:.albuterol, fentaNYL, lipiodol ultrafluid, sodium chloride flush, sodium tetradecyl sulfate   ASSESMENT:   *    Hematemesis.  Previous TIPS, embolization and BRTO 11/2019.   On CT the TIPS is patent.  There are multiple variceal embolisation coils present and residual varices at the gastric cardia and blood flow into the gastric renal shunt. 11/15/20 EGD: Large amount of red, clotted blood in the stomach.  Unable to clear blood due to active bleeding and large size of clots.  Active bleeding seen in the proximal stomach, suspect bleeding gastric varices.  Esophagus normal. 11/15/20 embolization of posterior bifurcation of gastrorenal shunt with balloon and coil assisted transvenous obliteration of gastric varices (BRTO) Octreotide, PPI drip, Rocephin in place.  *   Alcoholic cirrhosis.  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  Ongoing ETOH abuse.    *   Blood loss anemia.  Hgb 7.5 >> 8.2.  S/p 2 PRBCs.    *   Intubated to protect airway  *   HE.  Ammonia 265.     PLAN   *   Complete 72 hours of PPI and Sandostatin drips.  *   Begin lactulose po vs via tube.  ? Need for NG feeding tube if unable to take po after extubated.     Azucena Freed  11/16/2020, 10:04 AM Phone [phone]     Attending Physician Note   I have taken an interval history, reviewed the chart and examined the patient. I agree with the Advanced Practitioner's note, impression and recommendations.   * Bleeding gastric varices. Successful balloon and coil assisted retrograde transvenous obliteration of recanalized gastric varices  in the setting of massive hematemesis with associated hemorrhagic shock. No recurrent bleeding.  * ABL anemia * Decompensated alcoholic cirrhosis  Follow IR recommendations Continue octreotide IV for 2 days then discontinue  Continue pantoprazole IV for 2 days then discontinue Lactulose via NGT and then PO when awake post extubation Trend CBC, CMP, PT/INR, ammonia GI available as needed Outpatient GI follow up with Dr. Gala Romney.   [name], MD [facility]

## 2020-11-16 NOTE — Progress Notes (Signed)
NAME:  Bradley Miles, MRN:  [id number], DOB:  August 28, 1966, LOS: 1 ADMISSION DATE:  11/15/2020, CONSULTATION DATE:  11/15/2020 REFERRING MD:  Dr. Leonette Monarch, CHIEF COMPLAINT:  Hematemesis  Brief History   54 year old male with history of ongoing ETOH abuse (last drink 57/0), alcoholic cirrhosis, hepatic encephalopathy, and esophageal varices/ portal gastropathy s/p TIPS and BRTO 11/2019 presenting with one day history of hematemesis, black stools and increasing confusion.  Several episodes of hematemesis in ER.  Initial Hgb 13.2.  IR and GI consulted. CTA abd/ pelvis showed patent TIPS and no evidence of active arterial bleeding but appeared to have residual flow in a gastro renal shunt despite previous BRTO.  PCCM admitted.  Intubated and taken for upper endoscopy found to have normal esophagus and large volume clot the cardia, in the gastric fundus and in the proximal gastric body thought secondary to active proximal gastric bleeding, suspected bleeding gastric varices.  Afterwards on repeat CXR, noticed to have widened mediastinum and therefore went for CTA chest and then IR.  CTA chest showed no evidence for a hemothorax or mediastinal hematoma, showing left lower lung collapse suggestive of aspiration or mucous plugging.  TTE showed normal EF/ RV and no pericardial effusion.  Additionally, noted to have down trending Hgb with hypotension responding to fluids, vasopressors, and transfusion.  In IR, underwent successful transvenous obliteration of recurrent gastric varices and coil embolization of bifurcated gastro-renal shunt.   Past Medical History  ETOH abuse, alcoholic cirrhosis, hepatic encephalopathy, esophageal varices/ portal gastropathy s/p TIPS and BRTO 11/2019, tobacco abuse, DM, GERD, HTN, anxiety  Significant Hospital Events   12/7 Admit/ endo/ IR  Consults:  12/7 [place] [facility]  12/7 IR  Procedures:  12/7 ETT >> 12/7 Upper endoscopy 12/7 R radial Aline >> 12/7 foley >> 12/7 R  subclavian CVL (IR) >> 12/7 Retrograde cannulation of gastro-renal shunt, Embolization of posterior bifurcation of gastro-renal shunt, Balloon and coil assisted retrograde transvenous obliteration of gastric varices   Significant Diagnostic Tests:  12/7 CTA abd/ pelvis >> VASCULAR 1. Stable appearance of the TIPS stent. The stent appears to be patent based on the venous phase imaging but this could be better characterized with a dedicated liver duplex ultrasound. 2. Multiple variceal embolization coils. However, there is evidence for residual varices at the gastric cardia region. Despite having had a BRTO procedure, there appears to be residual flow in a gastro renal shunt. Gastro renal shunt may be supplying the gastric cardia varices. 3. No evidence for active arterial bleeding. NON-VASCULAR 1. Cirrhosis with a small amount of ascites. Spleen is prominent for size. 2. Diffuse wall thickening in the colon, most prominent in the rectum and distal sigmoid colon region. Findings are concerning for colitis. Etiology is uncertain but this does represent a change since 08/05/2020. 3. Cholelithiasis.  12/7 upper endoscopy  >> - Normal esophagus - Large volume of clotted blood and red blood in the cardia, in the gastric fundus and in the proximal gastric body that could not be cleared due to the volume of active bleeding and large size of the clots. Active proximal gastric bleeding, suspected bleeding gastric varices. - Normal duodenal bulb and second portion of the duodenum. - No specimens collected.  12/7 TTE >> LVEF 60-65%; no wall abnormalities, G1DD, normal RV, normal valves, no pericardial effusion  12/7 CTA chest/ aorta >> 1. Complete collapse of the left lower lobe. This lobar collapse represents the opacities and changes seen on the recent chest radiograph. Left lower  lobe collapse may be related to mucous plugging or aspiration. 2. No evidence for a hemothorax or mediastinal hematoma. No  significant pleural fluid. 3. Limited evaluation for pulmonary embolism due to technical issues. However, there is no evidence for a large central pulmonary embolism. No acute abnormality of the thoracic aorta. 4.  Aortic Atherosclerosis   12/7 Retrograde cannulation of gastro-renal shunt, Embolization of posterior bifurcation of gastro-renal shunt, Balloon and coil assisted retrograde transvenous obliteration of gastric varices >> Successful transvenous obliteration of recurrent gastric varices with lipiodol, sotradecol, and air admixture.  Coil embolization of bifurcated gastro-renal shunt.  Micro Data:  12/7 SARS2/ Flu  >> neg  Antimicrobials:  12/7 ceftriaxone >>  Interim history/subjective:  Remains sedated on fentanyl 200 mcg/min Remains on protonix, octreotide and vasopressin gtt S/p 2 units total PRBC Hgb trend 11.3-> 9.9-> 8.8-> 7.5-> 9.1-> 8.5 Afebrile +2L / net +4.6L No events overnight or further reports of bleeding  Objective   Blood pressure (!) 112/57, pulse 75, temperature 98.9 F (37.2 C), temperature source Axillary, resp. rate 20, weight 128 kg, SpO2 99 %.    Vent Mode: PRVC FiO2 (%):  [80 %-100 %] 80 % Set Rate:  [20 bmp] 20 bmp Vt Set:  [258 mL] 680 mL PEEP:  [5 cmH20-10 cmH20] 10 cmH20 Plateau Pressure:  [24 cmH20-25 cmH20] 24 cmH20   Intake/Output Summary (Last 24 hours) at 11/16/2020 0710 Last data filed at 11/16/2020 0600 Gross per 24 hour  Intake 6150.07 ml  Output 2050 ml  Net 4100.07 ml   Filed Weights   11/16/20 0600  Weight: 128 kg   Examination: General:  Critically ill adult male sedated on MV HEENT: MM pink/moist, ETT, pupils 3/reactive, scleral icterus and edema Neuro: sedated, RASS -4 CV: rr, no murmur, NSR PULM:  Non-labored, MV supported breaths, clear anteriorly, diminished in bases GI: obese, round, soft, bs active, foley  Extremities: warm/dry, +2 generalized edema  Resolved Hospital Problem list    Assessment & Plan:    Hematemesis secondary to recurrent gastric varices s/p coil embolization of bifurcated gastro-renal shunt in IR 12/7; prior TIPS and BRTO 11/2019- TIPS patent on imaging Decompensated alcoholic cirrhosis  ETOH abuse, ongoing - s/p upper endo 12/7 as above - initial MELD 17 with 3 month 6% mortality rate P:  Appreciate IR and [facility] assistance  Defer octreotide, protonix per GI recs Continue ceftriaxone for SBP/ GI prophylaxis Trending CBC/ CMET/ coags/ ammonia  ABLA secondary to above P:  S/p 2 units PRBC 12/7 Recheck H/H this afternoon, trend and transfuse for Hgb <7 or recurrent bleeding Monitor coags   Hypotension secondary to ABLA and sedation- resolved P:  Goal MAP > 65, has remained hemodynamically stable Stop vasopressin   Acute respiratory insufficiency  LLL collapse - aspiration vs mucous plugging P:  Full MV support, PRVC 8 cc/kg, rate 20 Continue to wean FiO2/ PEEP for sat goal > 92 VAP bundle/ PPI as above Albuterol prn  CXR now to re-evaluate if bronch is needed Lasix 40 mg x 1 Daily SBT/ WUA  Hepatic encephalopathy  P:  trending ammonia 67-> 75 Lactulose per GI Ongoing neuro exams   At risk for ETOH withdrawal - last drink 12/6 P:  PAD protocol with wean ->fentanyl, d/c propofol, add precedex as needed Daily Thiamine/ folate, MVI when able  At risk for AKI secondary to hypotension, ABLA, and IV contrast  P: UOP ~0.7 ml/kg/hr Net + 5L diuresis as above Stop MIVF Trend renal indices and strict  I/Os  DMT2 - poorly controlled, HA1c 8.4 10/24/2020 P:  Increase SSI to moderate Adding levemir 5 units BID  Tobacco abuse P:  Nicotine patch prn  LLL swelling - wife reports is chronic from his PVD, but remains warm.  Previous doppler study negative 10/2019 P:  Doppler study pending for completeness to rule out DVT  Widened mediastinum on CXR - CTA chest neg for a hemothorax or mediastinal hematoma or acute process other than LLL  collapse. No significant pleural fluid.  TTE neg for pericardial effusion  Best practice (evaluated daily)   Diet: NPO; will need to address EN given risk for malnutrition Pain/Anxiety/Delirium protocol (if indicated): PAD w/fentanyl/ precedex as needed VAP protocol (if indicated): yes DVT prophylaxis: SCDs GI prophylaxis: PPI gtt Glucose control: SSI moderate/ levemir Mobility: Bedrest last date of multidisciplinary goals of care discussion: 12/7 Family and staff present: wife/ NP Summary of discussion: continue full medical care Follow up goals of care discussion due 12/14 Code Status: Full, wife updated at bedside 12/8 Disposition: ICU  Labs   CBC: Recent Labs  Lab 11/15/20 0500 11/15/20 0955 11/15/20 1109 11/15/20 1434 11/15/20 1631 11/15/20 2148 11/16/20 0455  WBC 7.5  --   --   --   --   --  5.8  HGB 13.2   < > 9.9* 8.8* 7.5* 9.1* 8.5*  HCT 39.1   < > 29.0* 26.0* 22.0* 28.6* 26.8*  MCV 108.9*  --   --   --   --   --  108.9*  PLT PLATELET CLUMPS NOTED ON SMEAR, UNABLE TO ESTIMATE  --   --   --   --   --  53*   < > = values in this interval not displayed.    Basic Metabolic Panel: Recent Labs  Lab 11/15/20 0500 11/15/20 0500 11/15/20 0954 11/15/20 1109 11/15/20 1434 11/15/20 1631 11/15/20 2148 11/16/20 0455  NA 140   < >  --  143 144 142 142 143  K 4.5   < >  --  5.7* 5.7* 6.4* 4.9 4.9  CL 104  --   --  107  --   --  110 109  CO2 27  --   --   --   --   --  26 26  GLUCOSE 258*  --   --  190*  --   --  235* 254*  BUN 12  --   --  16  --   --  15 20  CREATININE 0.90  --   --  0.90  --   --  0.77 0.88  CALCIUM 8.3*  --   --   --   --   --  6.9* 6.8*  MG  --   --  1.2*  --   --   --   --   --   PHOS  --   --  3.8  --   --   --   --   --    < > = values in this interval not displayed.   GFR: Estimated Creatinine Clearance: 141.5 mL/min (by C-G formula based on SCr of 0.88 mg/dL). Recent Labs  Lab 11/15/20 0500 11/16/20 0455  WBC 7.5 5.8    Liver  Function Tests: Recent Labs  Lab 11/15/20 0500 11/16/20 0455  AST 38 32  ALT 18 12  ALKPHOS 111 64  BILITOT 2.6* 4.1*  PROT 6.2* 4.7*  ALBUMIN 2.5* 2.3*   Recent Labs  Lab 11/15/20 0500  LIPASE 34   Recent Labs  Lab 11/15/20 0953  AMMONIA 75*    ABG    Component Value Date/Time   PHART 7.304 (L) 11/15/2020 1631   PCO2ART 51.4 (H) 11/15/2020 1631   PO2ART 87 11/15/2020 1631   HCO3 25.5 11/15/2020 1631   TCO2 27 11/15/2020 1631   ACIDBASEDEF 1.0 11/15/2020 1631   O2SAT 95.0 11/15/2020 1631     Coagulation Profile: Recent Labs  Lab 11/15/20 0955  INR 1.9*    Cardiac Enzymes: No results for input(s): CKTOTAL, CKMB, CKMBINDEX, TROPONINI in the last 168 hours.  HbA1C: Hgb A1c MFr Bld  Date/Time Value Ref Range Status  10/24/2020 01:46 PM 8.4 (H) 4.8 - 5.6 % Final    Comment:             Prediabetes: 5.7 - 6.4          Diabetes: >6.4          Glycemic control for adults with diabetes: <7.0   06/20/2020 08:55 AM 6.9 (H) 4.8 - 5.6 % Final    Comment:             Prediabetes: 5.7 - 6.4          Diabetes: >6.4          Glycemic control for adults with diabetes: <7.0     CBG: Recent Labs  Lab 11/15/20 0942 11/15/20 1207 11/15/20 2114 11/16/20 0021 11/16/20 0415  GLUCAP 196* 191* 229* 249* 256*     Critical care time:  40 mins     Kennieth Rad, ACNP [facility] 11/16/2020, 8:13 AM

## 2020-11-16 NOTE — Progress Notes (Signed)
VASCULAR LAB    Left lower extremity venous duplex has been performed.  See CV proc for preliminary results.   Rupert Azzara, RVT 11/16/2020, 9:58 AM

## 2020-11-16 NOTE — Telephone Encounter (Signed)
Ask her to call when he gets out of the hospital and we can schedule a hospital follow-up at that point.

## 2020-11-16 NOTE — Telephone Encounter (Signed)
Spoke with pts spouse. Pt is in intensive care for a GI bleed. Please see noted in Epic. Per pts spouse. Pt will need to be seen after he's out of the hospital. Pt will also need paperwork filled out for pts employer. Per spouse she thinks pt will need to be seen prior to papers being filled out. She is aware that there's a fee for paperwork.

## 2020-11-16 NOTE — Progress Notes (Signed)
eLink Physician-Brief Progress Note Patient Name: Bradley Miles DOB: [birth year] MRN: [id number]   Date of Service  11/16/2020  HPI/Events of Note  Patient with hypotension after Precedex infusion was started, last hemoglobin is 8.3 gm / dl and no sign of overt bleeding.  eICU Interventions  Albumin 5 % 12.5 gm iv x 1.        Kerry Kass Chinelo Benn 11/16/2020, 10:31 PM

## 2020-11-16 NOTE — Progress Notes (Signed)
Referring Physician(s): Dr Barth Kirks  Supervising Physician: Aletta Edouard  Patient Status:  [name] - In-pt  Chief Complaint:    Hematemesis.    Subjective: Previous TIPS, embolization and BRTO 11/2019.   On CT the TIPS is patent.  There are multiple variceal embolisation coils present and residual varices at the gastric cardia and blood flow into the gastric renal shunt. 11/15/20 EGD: Large amount of red, clotted blood in the stomach.  Unable to clear blood due to active bleeding and large size of clots.  Active bleeding seen in the proximal stomach, suspect bleeding gastric varices.  Esophagus normal. 11/15/20 embolization of posterior bifurcation of gastrorenal shunt with balloon and coil assisted transvenous obliteration of gastric varices (BRTO) Octreotide, PPI drip, Rocephin in place.  Interventional Radiology Procedure Note Procedure:  1) Ultrasound and fluoroscopic guided central line placement 2) Retrograde cannulation of gastro-renal shunt 3) Embolization of posterior bifurcation of gastro-renal shunt 4) Balloon and coil assisted retrograde transvenous obliteration of gastric varices   Intubated  Able to open eyes to stimulation Wife at bedside  Allergies: Patient has no known allergies.  Medications: Prior to Admission medications   Medication Sig Start Date End Date Taking? Authorizing Provider  allopurinol (ZYLOPRIM) 100 MG tablet TAKE 1 TABLET BY MOUTH EVERY DAY Patient taking differently: Take 100 mg by mouth daily.  12/10/19  Yes [name], MD  Cholecalciferol (D3-1000) 25 MCG (1000 UT) capsule Take 1,000 Units by mouth daily.   Yes [provider]  dexlansoprazole (DEXILANT) 60 MG capsule Take 1 capsule (60 mg total) by mouth daily. 08/08/20  Yes Emokpae, Courage, MD  ferrous sulfate 325 (65 FE) MG EC tablet Take 1 tablet (325 mg total) by mouth 2 (two) times daily. 08/08/20 11/15/20 Yes Emokpae, Courage, MD  folic acid (FOLVITE) 202 MCG tablet  Take 400 mcg by mouth daily.   Yes [provider]  lactulose, encephalopathy, (CHRONULAC) 10 GM/15ML SOLN Take 15-30cc 1-2 daily to Titrate for 3-4 soft bowel movements a day Patient taking differently: Take 10-20 g by mouth See admin instructions. Take 15-30cc 1-2 daily to Titrate for 3-4 soft bowel movements a day 11/10/20  Yes Carlis Stable, NP  Multiple Vitamin (MULTIVITAMIN WITH MINERALS) TABS tablet Take 1 tablet by mouth daily. 11/30/19  Yes Elgergawy, Silver Huguenin, MD  nadolol (CORGARD) 40 MG tablet Take 1.5 tablets (60 mg total) by mouth daily. 08/08/20  Yes Emokpae, Courage, MD  ondansetron (ZOFRAN-ODT) 4 MG disintegrating tablet DISSOLVE 1 TABLET ON THE TONGUE EVERY 8 HOURS AS NEEDED FOR NAUSEA Patient taking differently: Take 4 mg by mouth every 8 (eight) hours as needed for nausea or vomiting.  01/20/20  Yes Annitta Needs, NP  PARoxetine (PAXIL) 20 MG tablet Take 1 tablet (20 mg total) by mouth daily. 08/08/20  Yes Emokpae, Courage, MD  sildenafil (VIAGRA) 100 MG tablet TAKE 1/2 TABLET BY MOUTH EVERY DAY Patient taking differently: Take 50 mg by mouth as needed for erectile dysfunction.  03/29/20  Yes [name], MD  sitaGLIPtin (JANUVIA) 50 MG tablet Take 1 tablet (50 mg total) by mouth daily. Patient taking differently: Take 100 mg by mouth daily.  10/25/20  Yes Taylor, Malena M, DO  sucralfate (CARAFATE) 1 g tablet TAKE 1 TABLET BY MOUTH BEFORE MEALS Patient taking differently: Take 1 g by mouth in the morning, at noon, and at bedtime.  10/25/20  Yes Lovena Le, Malena M, DO  venlafaxine (EFFEXOR) 25 MG tablet Take 25 mg by  mouth 2 (two) times daily.    Yes [provider]  blood glucose meter kit and supplies Dispense based on patient and insurance preference. Test blood sugar once daily.  (FOR ICD-10 E10.9, E11.9). 04/21/20   Elvia Collum M, DO  ferrous sulfate 325 (65 FE) MG EC tablet Take 1 tablet (325 mg total) by mouth 2 (two) times daily. Patient not taking:  Reported on 08/05/2020 11/29/19 06/21/20  Elgergawy, Silver Huguenin, MD  furosemide (LASIX) 40 MG tablet Take 0.5 tablets (20 mg total) by mouth daily. Take Daily for 5 Days, Then Take Daily Only As Needed for Edema Patient not taking: Reported on 11/15/2020 08/08/20   Roxan Hockey, MD  naltrexone (DEPADE) 50 MG tablet Take 1 tablet p.o. daily and on every 6th day take 2 tablets. Patient not taking: Reported on 11/15/2020 04/04/20   Elvia Collum M, DO  potassium chloride SA (KLOR-CON) 20 MEQ tablet Take 1 tablet (20 mEq total) by mouth daily as needed (Take when taking Lasix). Patient not taking: Reported on 11/15/2020 08/08/20   Roxan Hockey, MD  rifaximin (XIFAXAN) 550 MG TABS tablet Take 1 tablet (550 mg total) by mouth 2 (two) times daily. 11/10/20   Carlis Stable, NP  traZODone (DESYREL) 50 MG tablet TAKE 1 TABLET BY MOUTH AT BEDTIME AS NEEDED FOR INSOMNIA Patient not taking: Reported on 11/15/2020 08/18/20   Erven Colla, DO     Vital Signs: BP 112/60   Pulse 84   Temp 98.9 F (37.2 C) (Axillary)   Resp (!) 24   Wt 282 lb 3 oz (128 kg)   SpO2 92%   BMI 35.27 kg/m   Physical Exam Vitals reviewed.  Skin:    General: Skin is warm.     Comments: Right groin site NT no bleeding Bandage was blood soaked Removed bandage-- no bleeding RN to place new dressing  No hematoma Skin dry     Imaging: IR Venogram Renal Uni Left  Result Date: 11/16/2020 CLINICAL DATA:  54 year old male with history of alcoholic cirrhosis and upper gastrointestinal hemorrhage status post balloon and coil assisted retrograde transvenous obliteration of gastric varices on 11/22/2019 followed by TIPS creation on 11/24/2019 for continued hemorrhage. The patient presents with acute large volume hematemesis and CT evidence of patent gastric varicosities. EXAM: 1. Ultrasound and fluoroscopic guided central venous catheter placement. 2. Ultrasound-guided right common femoral venous access. 3. Selective  catheterization of gastro renal shunt. 4. Gastric variceal venogram. 5. Coil embolization of posterior branch of bifurcated gastro renal shunt. 6. Retrograde sclerotherapy of gastric varices assisted with coil embolization. MEDICATIONS: As per anesthesia record. ANESTHESIA/SEDATION: General - as administered by the Anesthesia department CONTRAST:  220 mL Omnipaque 300, intravenous FLUOROSCOPY TIME:  Fluoroscopy Time: 61 minutes 36 seconds (3,013 mGy). COMPLICATIONS: None immediate. PROCEDURE: Informed written consent was obtained from the patient's wife after a thorough discussion of the procedural risks, benefits and alternatives. All questions were addressed. Maximal Sterile Barrier Technique was utilized including caps, mask, sterile gowns, sterile gloves, sterile drape, hand hygiene and skin antiseptic. A timeout was performed prior to the initiation of the procedure. The right neck was prepped and draped in standard fashion. Ultrasound evaluation demonstrated patent internal jugular vein which was compressible and free of thrombus. A small skin nick was made. 21 gauge micropuncture needle was inserted under direct ultrasound visualization into the internal jugular vein. A micro puncture kit wire was advanced to the level of the right atrium. The micropuncture sheath was introduced and  inner dilator and wire removed. A 0.035 inch J-wire was placed to level of the right atrium, sero dilation was performed followed by placement of an 8 French triple-lumen, 20 cm catheter. The lumens flushed and aspirated with ease. The catheter tip was confirmed fluoroscopically to be located at the cavoatrial junction. The catheter was sutured in place and the lines were turned over to Anesthesia for venous access during the procedure. The right groin was then prepped and draped in standard fashion. Ultrasound evaluation demonstrated a patent right common femoral vein. A small skin nick was made at the planned needle entry site.  The right common femoral vein was accessed with a 21 gauge micropuncture needle. A Rosen wire was directed to the inferior vena cava. Serial dilation was performed and a 10 French tips sheath was inserted. A 5 French C2 catheter was directed over the [facility] wire and retracted until gaining access to the left renal vein ostium. The Rosen wire was then reinserted and position in an inferior branch of the left renal vein. Limited left renal venogram demonstrated patency and brisk outflow into the inferior vena cava. A Kumpe the catheter was then placed through the sheath adjacent to the indwelling safety wire and directed into the gastro renal shunt. Venogram demonstrated patent gastric varices with outflow into the gastro renal shunt. Balloon occlusion venogram via a 5 [place] Fogarty was then performed which demonstrated prominent nidus of gastric varices measuring approximately 4 cm by 1.6 cm with multiple gastric draining veins. There is no significant outflow or reflux into the left gastric, posterior gastric, or short gastric veins. Repeat venogram was performed in a left anterior oblique which demonstrated bifurcation of the gastro renal shunt with a smaller, posterior branch. The indwelling safety catheter was removed and a Progreat Omega microcatheter was advanced into the posterior branch of the spleno renal shunt. Venogram was performed confirming position just inferior to the previously placed coil pack. An assortment of Penumbra detachable of microcoils were then inserted into the superior aspect of this draining vein to prevent outflow of planned sclerosis. Next, sclerosing of the large network of gastric varices was not pursued. Multiple Fogarty balloon catheters were attempted to occlude the gastro renal shunt which was unsuccessful due to spontaneous rupture of the balloons. Therefore, an 8 [place] Merci balloon occlusion guide catheter was positioned in the inferior aspect of the main channel of the  gastro renal shunt with adequate balloon occlusion confirmed by venogram which again demonstrated no significant outflow and adequate embolization of the bifurcated splenorenal shunt branch. A solution of lipiodol, Sotradecol, and room air (1:2:3 ratio) was prepared into a sclerosing foam. A Progreat Omega microcatheter was inserted through the indwelling occlusion balloon into the varicosities. Under fluoroscopic visualization, the sclerosing admixture was administered slowly. The sclerosing solution filled the largest nidus of gastric varix. There is suggestion of a small amount of sclerosing solution traveling to the fundal gastric veins, which was decided to be the stopping point of sclerosis. The catheter was flushed. Next, an assortment of Penmbra detachable microcoils were delivered through the indwelling microcatheter to embolize the gastro renal shunt outflow. The occlusion balloon was slowly deflated under direct fluoroscopic visualization without evidence of coil or contrast migration. Completion venogram was performed through the indwelling tips sheath in the left renal ostium which demonstrated brisk renal vein outflow but sluggish outflow from the gastro renal shunt, suggestive of adequate sclerosis and embolization. The sheath was then removed and hemostasis was obtained with manual compression. The  patient tolerated the procedure well was transferred back to the ICU intubated and sedated. IMPRESSION: 1. Technically successful balloon and coil assisted retrograde transvenous obliteration of recanalized gastric varices in the setting of massive hematemesis with associated hemorrhagic shock. 2. Placement of right internal jugular triple-lumen central venous catheter (20 cm) with the catheter tip at the cavoatrial junction. The catheter is ready for immediate use. PLAN: Continue close monitoring of hemodynamics status. Expect continued melena for several days. Interventional Radiology will continue to  follow while inpatient, and arrange for outpatient follow-up visit in 1 month. Ruthann Cancer, MD [facility] Radiology Electronically Signed   By: Ruthann Cancer MD   On: 11/16/2020 09:21   IR Fluoro Guide CV Line Right  Result Date: 11/16/2020 CLINICAL DATA:  54 year old male with history of alcoholic cirrhosis and upper gastrointestinal hemorrhage status post balloon and coil assisted retrograde transvenous obliteration of gastric varices on 11/22/2019 followed by TIPS creation on 11/24/2019 for continued hemorrhage. The patient presents with acute large volume hematemesis and CT evidence of patent gastric varicosities. EXAM: 1. Ultrasound and fluoroscopic guided central venous catheter placement. 2. Ultrasound-guided right common femoral venous access. 3. Selective catheterization of gastro renal shunt. 4. Gastric variceal venogram. 5. Coil embolization of posterior branch of bifurcated gastro renal shunt. 6. Retrograde sclerotherapy of gastric varices assisted with coil embolization. MEDICATIONS: As per anesthesia record. ANESTHESIA/SEDATION: General - as administered by the Anesthesia department CONTRAST:  220 mL Omnipaque 300, intravenous FLUOROSCOPY TIME:  Fluoroscopy Time: 61 minutes 36 seconds (3,013 mGy). COMPLICATIONS: None immediate. PROCEDURE: Informed written consent was obtained from the patient's wife after a thorough discussion of the procedural risks, benefits and alternatives. All questions were addressed. Maximal Sterile Barrier Technique was utilized including caps, mask, sterile gowns, sterile gloves, sterile drape, hand hygiene and skin antiseptic. A timeout was performed prior to the initiation of the procedure. The right neck was prepped and draped in standard fashion. Ultrasound evaluation demonstrated patent internal jugular vein which was compressible and free of thrombus. A small skin nick was made. 21 gauge micropuncture needle was  inserted under direct ultrasound visualization into the internal jugular vein. A micro puncture kit wire was advanced to the level of the right atrium. The micropuncture sheath was introduced and inner dilator and wire removed. A 0.035 inch J-wire was placed to level of the right atrium, sero dilation was performed followed by placement of an 8 French triple-lumen, 20 cm catheter. The lumens flushed and aspirated with ease. The catheter tip was confirmed fluoroscopically to be located at the cavoatrial junction. The catheter was sutured in place and the lines were turned over to Anesthesia for venous access during the procedure. The right groin was then prepped and draped in standard fashion. Ultrasound evaluation demonstrated a patent right common femoral vein. A small skin nick was made at the planned needle entry site. The right common femoral vein was accessed with a 21 gauge micropuncture needle. A Rosen wire was directed to the inferior vena cava. Serial dilation was performed and a 10 French tips sheath was inserted. A 5 French C2 catheter was directed over the [facility] wire and retracted until gaining access to the left renal vein ostium. The Rosen wire was then reinserted and position in an inferior branch of the left renal vein. Limited left renal venogram demonstrated patency and brisk outflow into the inferior vena cava. A Kumpe the catheter was then placed through the sheath adjacent to the indwelling safety wire and  directed into the gastro renal shunt. Venogram demonstrated patent gastric varices with outflow into the gastro renal shunt. Balloon occlusion venogram via a 5 [place] Fogarty was then performed which demonstrated prominent nidus of gastric varices measuring approximately 4 cm by 1.6 cm with multiple gastric draining veins. There is no significant outflow or reflux into the left gastric, posterior gastric, or short gastric veins. Repeat venogram was performed in a left anterior oblique which  demonstrated bifurcation of the gastro renal shunt with a smaller, posterior branch. The indwelling safety catheter was removed and a Progreat Omega microcatheter was advanced into the posterior branch of the spleno renal shunt. Venogram was performed confirming position just inferior to the previously placed coil pack. An assortment of Penumbra detachable of microcoils were then inserted into the superior aspect of this draining vein to prevent outflow of planned sclerosis. Next, sclerosing of the large network of gastric varices was not pursued. Multiple Fogarty balloon catheters were attempted to occlude the gastro renal shunt which was unsuccessful due to spontaneous rupture of the balloons. Therefore, an 8 [place] Merci balloon occlusion guide catheter was positioned in the inferior aspect of the main channel of the gastro renal shunt with adequate balloon occlusion confirmed by venogram which again demonstrated no significant outflow and adequate embolization of the bifurcated splenorenal shunt branch. A solution of lipiodol, Sotradecol, and room air (1:2:3 ratio) was prepared into a sclerosing foam. A Progreat Omega microcatheter was inserted through the indwelling occlusion balloon into the varicosities. Under fluoroscopic visualization, the sclerosing admixture was administered slowly. The sclerosing solution filled the largest nidus of gastric varix. There is suggestion of a small amount of sclerosing solution traveling to the fundal gastric veins, which was decided to be the stopping point of sclerosis. The catheter was flushed. Next, an assortment of Penmbra detachable microcoils were delivered through the indwelling microcatheter to embolize the gastro renal shunt outflow. The occlusion balloon was slowly deflated under direct fluoroscopic visualization without evidence of coil or contrast migration. Completion venogram was performed through the indwelling tips sheath in the left renal ostium which  demonstrated brisk renal vein outflow but sluggish outflow from the gastro renal shunt, suggestive of adequate sclerosis and embolization. The sheath was then removed and hemostasis was obtained with manual compression. The patient tolerated the procedure well was transferred back to the ICU intubated and sedated. IMPRESSION: 1. Technically successful balloon and coil assisted retrograde transvenous obliteration of recanalized gastric varices in the setting of massive hematemesis with associated hemorrhagic shock. 2. Placement of right internal jugular triple-lumen central venous catheter (20 cm) with the catheter tip at the cavoatrial junction. The catheter is ready for immediate use. PLAN: Continue close monitoring of hemodynamics status. Expect continued melena for several days. Interventional Radiology will continue to follow while inpatient, and arrange for outpatient follow-up visit in 1 month. Ruthann Cancer, MD [facility] Radiology Electronically Signed   By: Ruthann Cancer MD   On: 11/16/2020 09:21   IR US Guide Vasc Access Right  Result Date: 11/16/2020 CLINICAL DATA:  53 year old male with history of alcoholic cirrhosis and upper gastrointestinal hemorrhage status post balloon and coil assisted retrograde transvenous obliteration of gastric varices on 11/22/2019 followed by TIPS creation on 11/24/2019 for continued hemorrhage. The patient presents with acute large volume hematemesis and CT evidence of patent gastric varicosities. EXAM: 1. Ultrasound and fluoroscopic guided central venous catheter placement. 2. Ultrasound-guided right common femoral venous access. 3. Selective catheterization of gastro renal shunt. 4.  Gastric variceal venogram. 5. Coil embolization of posterior branch of bifurcated gastro renal shunt. 6. Retrograde sclerotherapy of gastric varices assisted with coil embolization. MEDICATIONS: As per anesthesia record. ANESTHESIA/SEDATION:  General - as administered by the Anesthesia department CONTRAST:  220 mL Omnipaque 300, intravenous FLUOROSCOPY TIME:  Fluoroscopy Time: 61 minutes 36 seconds (3,013 mGy). COMPLICATIONS: None immediate. PROCEDURE: Informed written consent was obtained from the patient's wife after a thorough discussion of the procedural risks, benefits and alternatives. All questions were addressed. Maximal Sterile Barrier Technique was utilized including caps, mask, sterile gowns, sterile gloves, sterile drape, hand hygiene and skin antiseptic. A timeout was performed prior to the initiation of the procedure. The right neck was prepped and draped in standard fashion. Ultrasound evaluation demonstrated patent internal jugular vein which was compressible and free of thrombus. A small skin nick was made. 21 gauge micropuncture needle was inserted under direct ultrasound visualization into the internal jugular vein. A micro puncture kit wire was advanced to the level of the right atrium. The micropuncture sheath was introduced and inner dilator and wire removed. A 0.035 inch J-wire was placed to level of the right atrium, sero dilation was performed followed by placement of an 8 French triple-lumen, 20 cm catheter. The lumens flushed and aspirated with ease. The catheter tip was confirmed fluoroscopically to be located at the cavoatrial junction. The catheter was sutured in place and the lines were turned over to Anesthesia for venous access during the procedure. The right groin was then prepped and draped in standard fashion. Ultrasound evaluation demonstrated a patent right common femoral vein. A small skin nick was made at the planned needle entry site. The right common femoral vein was accessed with a 21 gauge micropuncture needle. A Rosen wire was directed to the inferior vena cava. Serial dilation was performed and a 10 French tips sheath was inserted. A 5 French C2 catheter was directed over the [facility] wire and retracted until  gaining access to the left renal vein ostium. The Rosen wire was then reinserted and position in an inferior branch of the left renal vein. Limited left renal venogram demonstrated patency and brisk outflow into the inferior vena cava. A Kumpe the catheter was then placed through the sheath adjacent to the indwelling safety wire and directed into the gastro renal shunt. Venogram demonstrated patent gastric varices with outflow into the gastro renal shunt. Balloon occlusion venogram via a 5 [place] Fogarty was then performed which demonstrated prominent nidus of gastric varices measuring approximately 4 cm by 1.6 cm with multiple gastric draining veins. There is no significant outflow or reflux into the left gastric, posterior gastric, or short gastric veins. Repeat venogram was performed in a left anterior oblique which demonstrated bifurcation of the gastro renal shunt with a smaller, posterior branch. The indwelling safety catheter was removed and a Progreat Omega microcatheter was advanced into the posterior branch of the spleno renal shunt. Venogram was performed confirming position just inferior to the previously placed coil pack. An assortment of Penumbra detachable of microcoils were then inserted into the superior aspect of this draining vein to prevent outflow of planned sclerosis. Next, sclerosing of the large network of gastric varices was not pursued. Multiple Fogarty balloon catheters were attempted to occlude the gastro renal shunt which was unsuccessful due to spontaneous rupture of the balloons. Therefore, an 8 [place] Merci balloon occlusion guide catheter was positioned in the inferior aspect of the main channel of the gastro renal shunt with adequate balloon occlusion confirmed  by venogram which again demonstrated no significant outflow and adequate embolization of the bifurcated splenorenal shunt branch. A solution of lipiodol, Sotradecol, and room air (1:2:3 ratio) was prepared into a sclerosing  foam. A Progreat Omega microcatheter was inserted through the indwelling occlusion balloon into the varicosities. Under fluoroscopic visualization, the sclerosing admixture was administered slowly. The sclerosing solution filled the largest nidus of gastric varix. There is suggestion of a small amount of sclerosing solution traveling to the fundal gastric veins, which was decided to be the stopping point of sclerosis. The catheter was flushed. Next, an assortment of Penmbra detachable microcoils were delivered through the indwelling microcatheter to embolize the gastro renal shunt outflow. The occlusion balloon was slowly deflated under direct fluoroscopic visualization without evidence of coil or contrast migration. Completion venogram was performed through the indwelling tips sheath in the left renal ostium which demonstrated brisk renal vein outflow but sluggish outflow from the gastro renal shunt, suggestive of adequate sclerosis and embolization. The sheath was then removed and hemostasis was obtained with manual compression. The patient tolerated the procedure well was transferred back to the ICU intubated and sedated. IMPRESSION: 1. Technically successful balloon and coil assisted retrograde transvenous obliteration of recanalized gastric varices in the setting of massive hematemesis with associated hemorrhagic shock. 2. Placement of right internal jugular triple-lumen central venous catheter (20 cm) with the catheter tip at the cavoatrial junction. The catheter is ready for immediate use. PLAN: Continue close monitoring of hemodynamics status. Expect continued melena for several days. Interventional Radiology will continue to follow while inpatient, and arrange for outpatient follow-up visit in 1 month. Ruthann Cancer, MD [facility] Radiology Electronically Signed   By: Ruthann Cancer MD   On: 11/16/2020 09:21   IR US Guide Vasc Access Right  Result Date:  11/16/2020 CLINICAL DATA:  55 year old male with history of alcoholic cirrhosis and upper gastrointestinal hemorrhage status post balloon and coil assisted retrograde transvenous obliteration of gastric varices on 11/22/2019 followed by TIPS creation on 11/24/2019 for continued hemorrhage. The patient presents with acute large volume hematemesis and CT evidence of patent gastric varicosities. EXAM: 1. Ultrasound and fluoroscopic guided central venous catheter placement. 2. Ultrasound-guided right common femoral venous access. 3. Selective catheterization of gastro renal shunt. 4. Gastric variceal venogram. 5. Coil embolization of posterior branch of bifurcated gastro renal shunt. 6. Retrograde sclerotherapy of gastric varices assisted with coil embolization. MEDICATIONS: As per anesthesia record. ANESTHESIA/SEDATION: General - as administered by the Anesthesia department CONTRAST:  220 mL Omnipaque 300, intravenous FLUOROSCOPY TIME:  Fluoroscopy Time: 61 minutes 36 seconds (3,013 mGy). COMPLICATIONS: None immediate. PROCEDURE: Informed written consent was obtained from the patient's wife after a thorough discussion of the procedural risks, benefits and alternatives. All questions were addressed. Maximal Sterile Barrier Technique was utilized including caps, mask, sterile gowns, sterile gloves, sterile drape, hand hygiene and skin antiseptic. A timeout was performed prior to the initiation of the procedure. The right neck was prepped and draped in standard fashion. Ultrasound evaluation demonstrated patent internal jugular vein which was compressible and free of thrombus. A small skin nick was made. 21 gauge micropuncture needle was inserted under direct ultrasound visualization into the internal jugular vein. A micro puncture kit wire was advanced to the level of the right atrium. The micropuncture sheath was introduced and inner dilator and wire removed. A 0.035 inch J-wire was placed to level of the right atrium,  sero dilation was performed followed by placement of an 8 [place]  triple-lumen, 20 cm catheter. The lumens flushed and aspirated with ease. The catheter tip was confirmed fluoroscopically to be located at the cavoatrial junction. The catheter was sutured in place and the lines were turned over to Anesthesia for venous access during the procedure. The right groin was then prepped and draped in standard fashion. Ultrasound evaluation demonstrated a patent right common femoral vein. A small skin nick was made at the planned needle entry site. The right common femoral vein was accessed with a 21 gauge micropuncture needle. A Rosen wire was directed to the inferior vena cava. Serial dilation was performed and a 10 French tips sheath was inserted. A 5 French C2 catheter was directed over the [facility] wire and retracted until gaining access to the left renal vein ostium. The Rosen wire was then reinserted and position in an inferior branch of the left renal vein. Limited left renal venogram demonstrated patency and brisk outflow into the inferior vena cava. A Kumpe the catheter was then placed through the sheath adjacent to the indwelling safety wire and directed into the gastro renal shunt. Venogram demonstrated patent gastric varices with outflow into the gastro renal shunt. Balloon occlusion venogram via a 5 [place] Fogarty was then performed which demonstrated prominent nidus of gastric varices measuring approximately 4 cm by 1.6 cm with multiple gastric draining veins. There is no significant outflow or reflux into the left gastric, posterior gastric, or short gastric veins. Repeat venogram was performed in a left anterior oblique which demonstrated bifurcation of the gastro renal shunt with a smaller, posterior branch. The indwelling safety catheter was removed and a Progreat Omega microcatheter was advanced into the posterior branch of the spleno renal shunt. Venogram was performed confirming position just inferior to  the previously placed coil pack. An assortment of Penumbra detachable of microcoils were then inserted into the superior aspect of this draining vein to prevent outflow of planned sclerosis. Next, sclerosing of the large network of gastric varices was not pursued. Multiple Fogarty balloon catheters were attempted to occlude the gastro renal shunt which was unsuccessful due to spontaneous rupture of the balloons. Therefore, an 8 [place] Merci balloon occlusion guide catheter was positioned in the inferior aspect of the main channel of the gastro renal shunt with adequate balloon occlusion confirmed by venogram which again demonstrated no significant outflow and adequate embolization of the bifurcated splenorenal shunt branch. A solution of lipiodol, Sotradecol, and room air (1:2:3 ratio) was prepared into a sclerosing foam. A Progreat Omega microcatheter was inserted through the indwelling occlusion balloon into the varicosities. Under fluoroscopic visualization, the sclerosing admixture was administered slowly. The sclerosing solution filled the largest nidus of gastric varix. There is suggestion of a small amount of sclerosing solution traveling to the fundal gastric veins, which was decided to be the stopping point of sclerosis. The catheter was flushed. Next, an assortment of Penmbra detachable microcoils were delivered through the indwelling microcatheter to embolize the gastro renal shunt outflow. The occlusion balloon was slowly deflated under direct fluoroscopic visualization without evidence of coil or contrast migration. Completion venogram was performed through the indwelling tips sheath in the left renal ostium which demonstrated brisk renal vein outflow but sluggish outflow from the gastro renal shunt, suggestive of adequate sclerosis and embolization. The sheath was then removed and hemostasis was obtained with manual compression. The patient tolerated the procedure well was transferred back to the ICU  intubated and sedated. IMPRESSION: 1. Technically successful balloon and coil assisted retrograde transvenous obliteration of recanalized gastric  varices in the setting of massive hematemesis with associated hemorrhagic shock. 2. Placement of right internal jugular triple-lumen central venous catheter (20 cm) with the catheter tip at the cavoatrial junction. The catheter is ready for immediate use. PLAN: Continue close monitoring of hemodynamics status. Expect continued melena for several days. Interventional Radiology will continue to follow while inpatient, and arrange for outpatient follow-up visit in 1 month. Ruthann Cancer, MD [facility] Radiology Electronically Signed   By: Ruthann Cancer MD   On: 11/16/2020 09:21   DG Chest Port 1 View  Result Date: 11/16/2020 CLINICAL DATA:  Respiratory failure. EXAM: PORTABLE CHEST 1 VIEW COMPARISON:  11/15/2020 FINDINGS: Endotracheal tube is 4.8 cm above the carina. Right jugular central line in the SVC region. Negative for pneumothorax. Improved aeration in the left lung but residual opacity in the retrocardiac space and along the left hemidiaphragm. Findings are compatible with known volume loss. Heart size is within normal limits. Again noted are multiple embolization coils in the upper abdomen. IMPRESSION: 1. Improving aeration at the left lung base. Residual retrocardiac opacities compatible with known volume loss. 2. Interval placement of a right jugular central line. Catheter tip is in the SVC region. Negative for pneumothorax. 3. Endotracheal tube is appropriately positioned. Electronically Signed   By: Markus Daft M.D.   On: 11/16/2020 08:47   DG CHEST PORT 1 VIEW  Result Date: 11/15/2020 CLINICAL DATA:  Status post intubation. EXAM: PORTABLE CHEST 1 VIEW COMPARISON:  Chest x-ray 11/15/2020 FINDINGS: The endotracheal tube is 5 cm above the carina. Interval significant change in the mediastinal contour since  earlier film. Findings worrisome for hemorrhage/hematoma. There is also a new large left pleural effusion. Findings worrisome for hemothorax. Would be concerned about variceal rupture/bleeding. IMPRESSION: 1. Endotracheal tube in good position. 2. New large left pleural effusion and significant change in mediastinal contour worrisome for hemorrhage/hematoma. Suspect variceal rupture/bleeding. These results were called by telephone at the time of interpretation on 11/15/2020 at 12:12 pm to the patient's ICU nurse, April, who verbally acknowledged these results. She was going to notify Dr. Ruthann Cancer. Electronically Signed   By: Marijo Sanes M.D.   On: 11/15/2020 12:17   DG Chest Port 1 View  Result Date: 11/15/2020 CLINICAL DATA:  Hematemesis since last evening. EXAM: PORTABLE CHEST 1 VIEW COMPARISON:  Chest x-ray 08/05/2020 FINDINGS: The cardiac silhouette, mediastinal hilar contours are within normal limits. The lungs are clear. No pleural effusions. No pulmonary lesions. No pneumothorax. The bony thorax is intact. IMPRESSION: No acute cardiopulmonary findings. Electronically Signed   By: Marijo Sanes M.D.   On: 11/15/2020 06:46   CT ANGIO CHEST AORTA W/CM &/OR WO/CM  Result Date: 11/15/2020 CLINICAL DATA:  54 year old with recent intubation and hematemesis. Concern for hemorrhage in the chest. EXAM: CT ANGIOGRAPHY CHEST WITH CONTRAST TECHNIQUE: Multidetector CT imaging of the chest was performed using the standard protocol during bolus administration of intravenous contrast. Multiplanar CT image reconstructions and MIPs were obtained to evaluate the vascular anatomy. CONTRAST:  62m OMNIPAQUE IOHEXOL 350 MG/ML SOLN COMPARISON:  Chest radiograph 11/15/2020 and CT a abdomen 11/15/2020 FINDINGS: Cardiovascular: Normal caliber of the thoracic aorta with mild atherosclerotic disease. No evidence for aortic dissection. Suboptimal evaluation of the pulmonary arteries but no evidence for a large central pulmonary  embolism. Heart size is normal. No significant pericardial fluid. Mediastinum/Nodes: Mediastinal shift towards the left related to volume loss in the left lung. There is no evidence for a mediastinal hematoma. No  significant chest lymphadenopathy. No axillary lymph node enlargement. Lungs/Pleura: There is no significant pericardial fluid. No evidence for a hemothorax. Complete collapse of the left lower lobe which is new from the recent abdominal CT. Few air bronchograms in the left lower lobe. Findings could be related to aspiration or mucous plugging with poorly defined filling defect in the proximal left lower lobe bronchus. Few patchy densities along the posterior aspect the left upper lobe. 2 mm nodule in the right upper lobe on sequence 6, image 64. Some focal thickening or atelectasis along the right major fissure on image 63. Nodular density in the right lung apex on sequence 6 image 34 measures roughly 4 mm. No significant airspace disease, consolidation or volume loss in the right lung. Negative for pneumothorax. Endotracheal tube is appropriately positioned above the carina. Upper Abdomen: Images of the upper abdomen again demonstrate a TIPS stent and embolization coils in the upper abdomen. Patient has perihepatic ascites. Stomach appears to be distended with high-density material that may represent blood products based on history. Musculoskeletal: No acute bone abnormality. Review of the MIP images confirms the above findings. IMPRESSION: 1. Complete collapse of the left lower lobe. This lobar collapse represents the opacities and changes seen on the recent chest radiograph. Left lower lobe collapse may be related to mucous plugging or aspiration. 2. No evidence for a hemothorax or mediastinal hematoma. No significant pleural fluid. 3. Limited evaluation for pulmonary embolism due to technical issues. However, there is no evidence for a large central pulmonary embolism. No acute abnormality of the  thoracic aorta. 4.  Aortic Atherosclerosis (ICD10-I70.0). Electronically Signed   By: Markus Daft M.D.   On: 11/15/2020 14:50   ECHOCARDIOGRAM COMPLETE  Result Date: 11/15/2020    ECHOCARDIOGRAM REPORT   Patient Name:   Bradley Miles Date of Exam: 11/15/2020 Medical Rec #:  [id number]       Height:       75.0 in Accession #:    [id number]      Weight:       263.3 lb Date of Birth:  May 20, 1966      BSA:          2.466 m Patient Age:    54 years        BP:           152/88 mmHg Patient Gender: M               HR:           110 bpm. Exam Location:  Inpatient Procedure: 2D Echo, Cardiac Doppler, Color Doppler and Intracardiac            Opacification Agent STAT ECHO Indications:    Pericardial effusion [[id number]]  History:        Patient has prior history of Echocardiogram examinations, most                 recent 06/02/2018. Arrythmias:PVC, Signs/Symptoms:Shortness of                 Breath; Risk Factors:Current Smoker, Hypertension and Diabetes.                 GERD. Palpitations.  Sonographer:    Vickie Epley RDCS Referring Phys: [id number] [place] MARSHALL  Sonographer Comments: Echo performed with patient supine and on artificial respirator. IMPRESSIONS  1. Left ventricular ejection fraction, by estimation, is 60 to 65%. The left ventricle has normal function. The left ventricle has no regional wall motion  abnormalities. Left ventricular diastolic parameters are consistent with Grade I diastolic dysfunction (impaired relaxation). Left ventricular filling pressures are probably abnormally low.  2. Right ventricular systolic function is normal. The right ventricular size is normal.  3. The mitral valve is normal in structure. No evidence of mitral valve regurgitation. No evidence of mitral stenosis.  4. The aortic valve is normal in structure. Aortic valve regurgitation is not visualized. No aortic stenosis is present.  5. The inferior vena cava is normal in size with greater than 50% respiratory variability, suggesting  right atrial pressure of 3 mmHg. Conclusion(s)/Recommendation(s): The inferior vena cava is remarkably small for a patient on positive pressure ventilation. Together with mitral inflow pattern of abnormal relaxation (with normal tissue Doppler velocities) and hyperdynamic LV contraction, this raises concern for hypovolemia. FINDINGS  Left Ventricle: Left ventricular ejection fraction, by estimation, is 60 to 65%. The left ventricle has normal function. The left ventricle has no regional wall motion abnormalities. Definity contrast agent was given IV to delineate the left ventricular  endocardial borders. The left ventricular internal cavity size was normal in size. There is no left ventricular hypertrophy. Left ventricular diastolic parameters are consistent with Grade I diastolic dysfunction (impaired relaxation). Left ventricular filling pressures are probably abnormally low. Right Ventricle: The right ventricular size is normal. No increase in right ventricular wall thickness. Right ventricular systolic function is normal. Left Atrium: Left atrial size was normal in size. Right Atrium: Right atrial size was normal in size. Pericardium: There is no evidence of pericardial effusion. Mitral Valve: The mitral valve is normal in structure. No evidence of mitral valve regurgitation. No evidence of mitral valve stenosis. Tricuspid Valve: The tricuspid valve is normal in structure. Tricuspid valve regurgitation is not demonstrated. No evidence of tricuspid stenosis. Aortic Valve: The aortic valve is normal in structure. Aortic valve regurgitation is not visualized. No aortic stenosis is present. Pulmonic Valve: The pulmonic valve was normal in structure. Pulmonic valve regurgitation is not visualized. No evidence of pulmonic stenosis. Aorta: The aortic root is normal in size and structure. Venous: The inferior vena cava is normal in size with greater than 50% respiratory variability, suggesting right atrial pressure of 3  mmHg. IAS/Shunts: No atrial level shunt detected by color flow Doppler.  LEFT VENTRICLE PLAX 2D LVIDd:         5.00 cm      Diastology LVIDs:         3.30 cm      LV e' medial:    8.24 cm/s LV PW:         0.90 cm      LV E/e' medial:  7.6 LV IVS:        0.90 cm      LV e' lateral:   12.40 cm/s LVOT diam:     2.50 cm      LV E/e' lateral: 5.1 LV SV:         93 LV SV Index:   38 LVOT Area:     4.91 cm  LV Volumes (MOD) LV vol d, MOD A2C: 126.0 ml LV vol d, MOD A4C: 126.0 ml LV vol s, MOD A2C: 30.5 ml LV vol s, MOD A4C: 30.8 ml LV SV MOD A2C:     95.5 ml LV SV MOD A4C:     126.0 ml LV SV MOD BP:      98.9 ml RIGHT VENTRICLE RV S prime:     16.20 cm/s TAPSE (M-mode): 2.5 cm LEFT  ATRIUM             Index       RIGHT ATRIUM           Index LA diam:        3.70 cm 1.50 cm/m  RA Area:     14.80 cm LA Vol (A2C):   41.9 ml 16.99 ml/m RA Volume:   36.60 ml  14.84 ml/m LA Vol (A4C):   40.4 ml 16.38 ml/m LA Biplane Vol: 43.1 ml 17.47 ml/m  AORTIC VALVE LVOT Vmax:   123.00 cm/s LVOT Vmean:  91.500 cm/s LVOT VTI:    0.190 m  AORTA Ao Root diam: 3.70 cm MITRAL VALVE MV Area (PHT): 4.63 cm     SHUNTS MV Decel Time: 164 msec     Systemic VTI:  0.19 m MV E velocity: 63.00 cm/s   Systemic Diam: 2.50 cm MV A velocity: 103.00 cm/s MV E/A ratio:  0.61 Mihai Croitoru MD Electronically signed by Sanda Klein MD Signature Date/Time: 11/15/2020/1:53:18 PM    Final    US LIVER DOPPLER  Result Date: 11/16/2020 CLINICAL DATA:  54 year old with hematemesis and suspect recurrent gastric variceal bleeding. Patient has had a BRTO procedure and TIPS procedure. Recent repeat BRTO procedure. Evaluate patency of the TIPS stent. EXAM: DUPLEX ULTRASOUND OF LIVER AND TIPS SHUNT TECHNIQUE: Color and duplex Doppler ultrasound was performed to evaluate the hepatic in-flow and out-flow vessels. COMPARISON:  Liver duplex 10/24/2020 FINDINGS: Portal Vein Velocities Main:  48 cm/sec Right:  16 cm/sec Left:  19 cm/sec TIPS Stent Velocities  Proximal-portal: 18 cm/sec Mid:  48 Distal-hepatic: 20 cm/sec IVC: Patent with normal phasicity. Hepatic Vein Velocities Right:  22 cm/sec Mid:  23 cm/sec Left:  33 cm/sec Splenic Vein: 27 cm/sec Superior Mesenteric Vein: 13 cm/sec Hepatic Artery: 93 cm/sec Ascities: Present Varices: Not visualized Hepatofugal flow in the right portal vein. Difficult to evaluate the flow direction in the left portal vein. TIPS stent is patent but the velocities have decreased compared to the previous examination. IMPRESSION: 1. TIPS stent is patent.  Main portal veins are patent. 2. Perihepatic ascites. Electronically Signed   By: Markus Daft M.D.   On: 11/16/2020 08:41   VAS [place] LOWER EXTREMITY VENOUS (DVT)  Result Date: 11/16/2020  Lower Venous DVT Study Indications: Edema.  Comparison Study: Prior negative left lower extremity venous duplex done                   10/13/19, is available for comparison Performing Technologist: Sharion Dove RVS  Examination Guidelines: A complete evaluation includes B-mode imaging, spectral Doppler, color Doppler, and power Doppler as needed of all accessible portions of each vessel. Bilateral testing is considered an integral part of a complete examination. Limited examinations for reoccurring indications may be performed as noted. The reflux portion of the exam is performed with the patient in reverse Trendelenburg.  +-----+---------------+---------+-----------+----------+--------------+ RIGHTCompressibilityPhasicitySpontaneityPropertiesThrombus Aging +-----+---------------+---------+-----------+----------+--------------+ CFV  Full           Yes      Yes                                 +-----+---------------+---------+-----------+----------+--------------+   +---------+---------------+---------+-----------+----------+--------------+ LEFT     CompressibilityPhasicitySpontaneityPropertiesThrombus Aging  +---------+---------------+---------+-----------+----------+--------------+ CFV      Full           Yes      Yes                                 +---------+---------------+---------+-----------+----------+--------------+  SFJ      Full                                                        +---------+---------------+---------+-----------+----------+--------------+ FV Prox  Full                                                        +---------+---------------+---------+-----------+----------+--------------+ FV Mid   Full                                                        +---------+---------------+---------+-----------+----------+--------------+ FV DistalFull                                                        +---------+---------------+---------+-----------+----------+--------------+ PFV      Full                                                        +---------+---------------+---------+-----------+----------+--------------+ POP      Full           Yes      Yes                                 +---------+---------------+---------+-----------+----------+--------------+ PTV      Full                                                        +---------+---------------+---------+-----------+----------+--------------+ PERO     Full                                                        +---------+---------------+---------+-----------+----------+--------------+     Summary: RIGHT: - No evidence of common femoral vein obstruction.  LEFT: - Findings appear essentially unchanged compared to previous examination. - There is no evidence of deep vein thrombosis in the lower extremity.  *See table(s) above for measurements and observations.    Preliminary    [place] EKG SITE RITE  Result Date: 11/15/2020 If Site Rite image not attached, placement could not be confirmed due to current cardiac rhythm.  CT Angio Abd/Pel w/ and/or w/o  Result Date:  11/15/2020 CLINICAL DATA:  54 year old with cirrhosis with prior BTRO and TIPS procedure. Patient presents with hematemesis. EXAM: CTA ABDOMEN AND PELVIS WITHOUT AND WITH  CONTRAST TECHNIQUE: Multidetector CT imaging of the abdomen and pelvis was performed using the standard protocol during bolus administration of intravenous contrast. Multiplanar reconstructed images and MIPs were obtained and reviewed to evaluate the vascular anatomy. CONTRAST:  161m OMNIPAQUE IOHEXOL 350 MG/ML SOLN COMPARISON:  Ultrasound 10/24/2020 and CTA 08/05/2020 FINDINGS: VASCULAR Aorta: Mild atherosclerotic disease in the abdominal aorta without aneurysm or dissection. Celiac: Celiac trunk is patent. Limited evaluation of the celiac artery branches due to artifact from the embolization coils. However, splenic artery and hepatic arteries are patent. Incidentally, there is a replaced left hepatic artery coming off the left gastric artery. Left gastric artery originates directly from the abdominal aorta. SMA: Patent without evidence of aneurysm, dissection, vasculitis or significant stenosis. Renals: Bilateral renal arteries are widely patent without aneurysm or dissection. Small accessory renal arteries bilaterally. IMA: Patent. Inflow: Atherosclerotic disease involving the bilateral iliac arteries. External, internal and common iliac arteries are patent bilaterally. Proximal Outflow: Proximal femoral arteries are patent bilaterally. Veins: TIPS stent has a stable configuration and position. There appears to be flow within the stent on the delayed images but this could be better characterized with ultrasound. Multiple embolization coils in the upper abdomen related to variceal embolization. Evidence for varices near the gastric cardia best seen on sequence 7, image 21. Although the patient has had a BRTO procedure, the gastro renal shunt is still patent near the renal vein. No significant varices in the distal esophageal region. IVC and  renal veins are patent. Iliac veins are patent. Review of the MIP images confirms the above findings. NON-VASCULAR Lower chest: Dependent densities in both lower lobes are suggestive for atelectasis. No large pleural effusions. Hepatobiliary: Liver has a mildly nodular contour and compatible with cirrhosis. No focal liver lesion. Evidence for stones at the base of the gallbladder without gallbladder distention or inflammatory changes. Small amount of perihepatic ascites. Pancreas: Unremarkable. No pancreatic ductal dilatation or surrounding inflammatory changes. Spleen: Spleen is mildly prominent for size measuring roughly 13.7 cm in length. Small amount of ascites in the left upper quadrant. No focal splenic lesion. Adrenals/Urinary Tract: Normal appearance of the adrenal glands. Normal appearance of both kidneys. No hydronephrosis. No suspicious renal lesions. Normal appearance of the urinary bladder. Stomach/Bowel: Diffuse wall thickening involving the rectum and distal sigmoid colon. Mild wall thickening throughout the colon. No evidence for small bowel dilatation. No evidence for a bowel obstruction. The stomach is distended with air-fluid level. High-density material along the dependent aspect of the stomach could represent blood products. Proximal duodenum is distended. Lymphatic: No significant lymph node enlargement in the abdomen or pelvis. Reproductive: Prostate is unremarkable. Other: Trace pelvic ascites with presacral edema. Small amount of ascites in the abdomen, mostly around the liver. Mild mesenteric edema. Musculoskeletal: Bilateral pars defects at L5 without significant anterolisthesis. IMPRESSION: VASCULAR 1. Stable appearance of the TIPS stent. The stent appears to be patent based on the venous phase imaging but this could be better characterized with a dedicated liver duplex ultrasound. 2. Multiple variceal embolization coils. However, there is evidence for residual varices at the gastric  cardia region. Despite having had a BRTO procedure, there appears to be residual flow in a gastro renal shunt. Gastro renal shunt may be supplying the gastric cardia varices. 3. No evidence for active arterial bleeding. NON-VASCULAR 1. Cirrhosis with a small amount of ascites. Spleen is prominent for size. 2. Diffuse wall thickening in the colon, most prominent in the rectum and distal sigmoid colon region. Findings are concerning  for colitis. Etiology is uncertain but this does represent a change since 08/05/2020. 3. Cholelithiasis. Electronically Signed   By: Markus Daft M.D.   On: 11/15/2020 09:08    Labs:  CBC: Recent Labs    06/20/20 0855 08/08/20 0651 08/08/20 0651 10/24/20 1346 11/15/20 0500 11/15/20 0955 11/15/20 1631 11/15/20 2148 11/16/20 0455 11/16/20 0830  WBC  --  3.0*  --  4.6 7.5  --   --   --  5.8  --   HGB   < > 10.6*   < > 12.5* 13.2   < > 7.5* 9.1* 8.5* 8.2*  HCT   < > 32.2*   < > 36.4* 39.1   < > 22.0* 28.6* 26.8* 24.0*  PLT  --  53*  --  84* PLATELET CLUMPS NOTED ON SMEAR, UNABLE TO ESTIMATE  --   --   --  53*  --    < > = values in this interval not displayed.    COAGS: Recent Labs    11/19/19 2208 11/22/19 2304 08/05/20 0031 08/05/20 0543 08/08/20 0651 11/15/20 0955  INR 1.3*   < > 1.5* 1.7* 1.3* 1.9*  APTT 41*  --  39*  --   --   --    < > = values in this interval not displayed.    BMP: Recent Labs    08/06/20 0640 08/06/20 0640 08/07/20 0710 08/07/20 0710 08/08/20 0651 08/08/20 0651 10/24/20 1029 11/15/20 0500 11/15/20 0500 11/15/20 1109 11/15/20 1434 11/15/20 1631 11/15/20 2148 11/16/20 0455 11/16/20 0830  NA 141   < > 140   < > 141   < > 137 140   < > 143   < > 142 142 143 146*  K 4.0   < > 3.5   < > 3.3*   < > 3.9 4.5   < > 5.7*   < > 6.4* 4.9 4.9 4.8  CL 106   < > 105   < > 107   < > 100 104  --  107  --   --  110 109  --   CO2 30   < > 30   < > 27   < > 22 27  --   --   --   --  26 26  --   GLUCOSE 124*   < > 100*   < > 94   <  > 350* 258*  --  190*  --   --  235* 254*  --   BUN 10   < > 7   < > <5*   < > 11 12  --  16  --   --  15 20  --   CALCIUM 7.6*   < > 7.6*   < > 7.9*   < > 8.6* 8.3*  --   --   --   --  6.9* 6.8*  --   CREATININE 0.59*   < > 0.54*   < > 0.49*   < > 0.86 0.90  --  0.90  --   --  0.77 0.88  --   GFRNONAA >60   < > >60   < > >60   < > 100 >60  --   --   --   --  >60 >60  --   GFRAA >60  --  >60  --  >60  --  116  --   --   --   --   --   --   --   --    < > =  values in this interval not displayed.    LIVER FUNCTION TESTS: Recent Labs    08/08/20 0651 10/24/20 1029 11/15/20 0500 11/16/20 0455  BILITOT 2.9* 1.4* 2.6* 4.1*  AST 87* 41* 38 32  ALT 26 20 18 12   ALKPHOS 133* 113 111 64  PROT 5.1* 6.6 6.2* 4.7*  ALBUMIN 2.0* 3.1* 2.5* 2.3*    Assessment and Plan:  TIPS/BRTO 12/20 New hematemesis Procedure yesterday in IR:  Embolization of posterior bifurcation of gastro-renal shunt Balloon and coil assisted retrograde transvenous obliteration of gastric varices PCCM and GI following   Electronically Signed: Lavonia Drafts, PA-C 11/16/2020, 1:36 PM   I spent a total of 15 Minutes at the the patient's bedside AND on the patient's hospital floor or unit, greater than 50% of which was counseling/coordinating care for TIPS/BRTO

## 2020-11-16 NOTE — Progress Notes (Signed)
Initial Nutrition Assessment  RD working remotely.  DOCUMENTATION CODES:   Obesity unspecified  INTERVENTION:   If unable to extubate, recommend placement of Cortrak or small-bore NG tube and initiation of enteral nutrition. Recommend: - Start Vital 1.5 @ 20 ml/hr and advance by 10 ml q 4 hours to goal rate of 60 ml/hr (1440 ml/day) - ProSource TF 90 ml QID  Recommended tube feeding regimen at goal would provide 2480 kcal, 185 grams of protein, and 1100 ml of H2O.  If TF initiated, monitor magnesium, potassium, and phosphorus daily for at least 3 days, MD to replete as needed, as pt is at risk for refeeding syndrome given hypomagnesemia, EtOH abuse.  NUTRITION DIAGNOSIS:   Inadequate oral intake related to acute illness, altered GI function as evidenced by NPO status.  GOAL:   Patient will meet greater than or equal to 90% of their needs  MONITOR:   Vent status, Labs, Weight trends, I & O's  REASON FOR ASSESSMENT:   Ventilator    ASSESSMENT:   54 year old male who presented to the ED on 12/07 with hematemesis. PMH of ongoing EtOH abuse, alcoholic cirrhosis, hepatic encephalopathy, and esophageal varices/portal gastropathy s/p TIPS and BRTO 11/2019, tobacco abuse, DM, GERD, HTN, anxiety.   12/07 - emergent EGD revealing uncontrolled UGI bleed; s/p IR guided coil embolization of gastrorenal shunt  Per notes, wife reports that pt continues to drink at least 6 drinks that he gets from convenience store and possibly more as he sometimes hides drinks.  Per MD, possible extubation later today or early tomorrow. No enteral access at this time. MD would like to hold off on placement of OG tube or Cortrak at this time. RD will leave TF recommendations for use if pt is unable to be extubated.  Per GI note, if pt unable to take POs after extubation, needs Cortrak or NG tube for lactulose administration.  Patient is currently intubated on ventilator support MV: 13.7 L/min Temp  (24hrs), Avg:98.8 F (37.1 C), Min:98.6 F (37 C), Max:99 F (37.2 C) BP (a-line): 99/54 MAP (a-line): 68  Drips: Fentnayl Octreotide Protonix  Medications reviewed and include: colace, IV folic acid, IV lasix, SSI q 4 hours, levemir 5 units BID, lactulose, MVI with minerals, miralax, IV thiamine, IV abx, IV magnesium sulfate 4 grams once  Labs reviewed: sodium 146, magnesium 1.1, ionized calcium 1.07, ammonia 265, hemoglobin 8.2 CBG's: 191-256 x 24 hours  UOP: 2150 ml x 24 hours I/O's: +4.7 L since admit  NUTRITION - FOCUSED PHYSICAL EXAM:  Unable to complete at this time. RD working remotely.  Diet Order:   Diet Order            Diet NPO time specified  Diet effective now                 EDUCATION NEEDS:   No education needs have been identified at this time  Skin:  Skin Assessment: Reviewed RN Assessment  Last BM:  11/15/20  Height:   Ht Readings from Last 1 Encounters:  11/10/20 6\' 3"  (1.905 m)    Weight:   Wt Readings from Last 1 Encounters:  11/16/20 128 kg   Ideal Body Weight: 89.1  BMI:  Body mass index is 35.27 kg/m.  Estimated Nutritional Needs:   Kcal:  2521  Protein:  175-195 grams  Fluid:  >/= 2.0 L    Gustavus Bryant, MS, RD, LDN Inpatient Clinical Dietitian Please see AMiON for contact information.

## 2020-11-16 NOTE — Telephone Encounter (Signed)
Spoke with pts spouse and she will call when her spouse gets out of the hospital.

## 2020-11-16 NOTE — Progress Notes (Signed)
ABG results given to Dr. Tacy Learn. No new orders received at this time. RT will continue to monitor.

## 2020-11-16 NOTE — Telephone Encounter (Signed)
Please call patient's wife. She said patient has a GI bleed and was at [facility]. [phone]

## 2020-11-16 NOTE — Progress Notes (Signed)
Inpatient Diabetes Program Recommendations  AACE/ADA: New Consensus Statement on Inpatient Glycemic Control (2015)  Target Ranges:  Prepandial:   less than 140 mg/dL      Peak postprandial:   less than 180 mg/dL (1-2 hours)      Critically ill patients:  140 - 180 mg/dL   Lab Results  Component Value Date   GLUCAP 243 (H) 11/16/2020   HGBA1C 8.4 (H) 10/24/2020    Review of Glycemic Control Results for CLENNON, NASCA" (MRN [id number]) as of 11/16/2020 11:54  Ref. Range 11/16/2020 00:21 11/16/2020 04:15 11/16/2020 07:58 11/16/2020 11:12  Glucose-Capillary Latest Ref Range: 70 - 99 mg/dL 249 (H) 256 (H) 217 (H) 243 (H)   Diabetes history: Type 2 DM Outpatient Diabetes medications: Januvia 50 mg QD Current orders for Inpatient glycemic control: Levemir 5 units BID, Novolog 0-15 units Q4H  Inpatient Diabetes Program Recommendations:    Consider further increasing Levemir to 10 units BID.   Thanks, Bronson Curb, MSN, RNC-OB Diabetes Coordinator [phone] (8a-5p)

## 2020-11-16 NOTE — Progress Notes (Signed)
Assisted tele visit to patient with wife.  Margaret Pyle, RN

## 2020-11-17 ENCOUNTER — Inpatient Hospital Stay (HOSPITAL_COMMUNITY): Payer: Managed Care, Other (non HMO)

## 2020-11-17 ENCOUNTER — Ambulatory Visit (HOSPITAL_COMMUNITY): Admission: RE | Admit: 2020-11-17 | Payer: Managed Care, Other (non HMO) | Source: Ambulatory Visit

## 2020-11-17 ENCOUNTER — Encounter (HOSPITAL_COMMUNITY): Payer: Self-pay

## 2020-11-17 LAB — GLUCOSE, CAPILLARY
Glucose-Capillary: 119 mg/dL — ABNORMAL HIGH (ref 70–99)
Glucose-Capillary: 128 mg/dL — ABNORMAL HIGH (ref 70–99)
Glucose-Capillary: 138 mg/dL — ABNORMAL HIGH (ref 70–99)
Glucose-Capillary: 174 mg/dL — ABNORMAL HIGH (ref 70–99)
Glucose-Capillary: 178 mg/dL — ABNORMAL HIGH (ref 70–99)
Glucose-Capillary: 212 mg/dL — ABNORMAL HIGH (ref 70–99)
Glucose-Capillary: 214 mg/dL — ABNORMAL HIGH (ref 70–99)

## 2020-11-17 LAB — URINALYSIS, ROUTINE W REFLEX MICROSCOPIC
Bacteria, UA: NONE SEEN
Bilirubin Urine: NEGATIVE
Glucose, UA: NEGATIVE mg/dL
Ketones, ur: NEGATIVE mg/dL
Leukocytes,Ua: NEGATIVE
Nitrite: NEGATIVE
Protein, ur: NEGATIVE mg/dL
Specific Gravity, Urine: 1.031 — ABNORMAL HIGH (ref 1.005–1.030)
pH: 5 (ref 5.0–8.0)

## 2020-11-17 LAB — COMPREHENSIVE METABOLIC PANEL
ALT: 16 U/L (ref 0–44)
AST: 44 U/L — ABNORMAL HIGH (ref 15–41)
Albumin: 2.3 g/dL — ABNORMAL LOW (ref 3.5–5.0)
Alkaline Phosphatase: 61 U/L (ref 38–126)
Anion gap: 8 (ref 5–15)
BUN: 30 mg/dL — ABNORMAL HIGH (ref 6–20)
CO2: 29 mmol/L (ref 22–32)
Calcium: 7.6 mg/dL — ABNORMAL LOW (ref 8.9–10.3)
Chloride: 111 mmol/L (ref 98–111)
Creatinine, Ser: 1.14 mg/dL (ref 0.61–1.24)
GFR, Estimated: >60 mL/min (ref >60)
Glucose, Bld: 219 mg/dL — ABNORMAL HIGH (ref 70–99)
Potassium: 3.8 mmol/L (ref 3.5–5.1)
Sodium: 148 mmol/L — ABNORMAL HIGH (ref 135–145)
Total Bilirubin: 2.8 mg/dL — ABNORMAL HIGH (ref 0.3–1.2)
Total Protein: 4.8 g/dL — ABNORMAL LOW (ref 6.5–8.1)

## 2020-11-17 LAB — POCT I-STAT, CHEM 8
BUN: 17 mg/dL (ref 6–20)
Calcium, Ion: 1.01 mmol/L — ABNORMAL LOW (ref 1.15–1.40)
Chloride: 108 mmol/L (ref 98–111)
Creatinine, Ser: 0.8 mg/dL (ref 0.61–1.24)
Glucose, Bld: 199 mg/dL — ABNORMAL HIGH (ref 70–99)
HCT: 23 % — ABNORMAL LOW (ref 39.0–52.0)
Hemoglobin: 7.8 g/dL — ABNORMAL LOW (ref 13.0–17.0)
Potassium: 6.1 mmol/L — ABNORMAL HIGH (ref 3.5–5.1)
Sodium: 142 mmol/L (ref 135–145)
TCO2: 27 mmol/L (ref 22–32)

## 2020-11-17 LAB — MAGNESIUM: Magnesium: 1.7 mg/dL (ref 1.7–2.4)

## 2020-11-17 LAB — TRIGLYCERIDES: Triglycerides: 108 mg/dL (ref <150)

## 2020-11-17 LAB — CBC
HCT: 24.6 % — ABNORMAL LOW (ref 39.0–52.0)
Hemoglobin: 7.9 g/dL — ABNORMAL LOW (ref 13.0–17.0)
MCH: 35.3 pg — ABNORMAL HIGH (ref 26.0–34.0)
MCHC: 32.1 g/dL (ref 30.0–36.0)
MCV: 109.8 fL — ABNORMAL HIGH (ref 80.0–100.0)
Platelets: 57 10*3/uL — ABNORMAL LOW (ref 150–400)
RBC: 2.24 MIL/uL — ABNORMAL LOW (ref 4.22–5.81)
RDW: 20.2 % — ABNORMAL HIGH (ref 11.5–15.5)
WBC: 4 10*3/uL (ref 4.0–10.5)
nRBC: 0 % (ref 0.0–0.2)

## 2020-11-17 LAB — PROTIME-INR
INR: 1.7 — ABNORMAL HIGH (ref 0.8–1.2)
Prothrombin Time: 19.4 seconds — ABNORMAL HIGH (ref 11.4–15.2)

## 2020-11-17 MED ORDER — THIAMINE HCL 100 MG PO TABS: 100.0000 mg | ORAL_TABLET | Freq: Every day | ORAL | Status: DC

## 2020-11-17 MED ORDER — ORAL CARE MOUTH RINSE: 15.0000 mL | Freq: Two times a day (BID) | OROMUCOSAL | Status: DC

## 2020-11-17 MED ORDER — LIP MEDEX EX OINT
TOPICAL_OINTMENT | CUTANEOUS | Status: DC | PRN
  Filled 2020-11-17: qty 7

## 2020-11-17 MED ORDER — THIAMINE HCL 100 MG/ML IJ SOLN: 100.0000 mg | Freq: Every day | INTRAMUSCULAR | Status: DC

## 2020-11-17 MED ORDER — LACTATED RINGERS IV BOLUS
500.0000 mL | Freq: Once | INTRAVENOUS | Status: AC
  Administered 2020-11-17: 500 mL via INTRAVENOUS

## 2020-11-17 MED ORDER — PANTOPRAZOLE SODIUM 40 MG IV SOLR
40.0000 mg | Freq: Two times a day (BID) | INTRAVENOUS | Status: DC
  Administered 2020-11-17 – 2020-11-20 (×7): 40 mg via INTRAVENOUS
  Filled 2020-11-17 (×7): qty 40

## 2020-11-17 MED ORDER — CLONIDINE HCL 0.1 MG PO TABS
0.1000 mg | ORAL_TABLET | Freq: Two times a day (BID) | ORAL | Status: DC
  Administered 2020-11-17: 0.1 mg
  Filled 2020-11-17: qty 1

## 2020-11-17 MED ORDER — CLONIDINE HCL 0.1 MG PO TABS
0.1000 mg | ORAL_TABLET | Freq: Two times a day (BID) | ORAL | Status: DC
  Administered 2020-11-17: 0.1 mg via ORAL
  Filled 2020-11-17: qty 1

## 2020-11-17 MED ORDER — MAGNESIUM SULFATE 2 GM/50ML IV SOLN
2.0000 g | Freq: Once | INTRAVENOUS | Status: AC
  Administered 2020-11-17: 2 g via INTRAVENOUS
  Filled 2020-11-17: qty 50

## 2020-11-17 MED ORDER — PHENYLEPHRINE CONCENTRATED 100MG/250ML (0.4 MG/ML) INFUSION SIMPLE
0.0000 ug/min | INTRAVENOUS | Status: DC
  Administered 2020-11-17: 20 ug/min via INTRAVENOUS
  Filled 2020-11-17: qty 250

## 2020-11-17 MED ORDER — ADULT MULTIVITAMIN LIQUID CH
15.0000 mL | Freq: Every day | ORAL | Status: DC
  Filled 2020-11-17: qty 15

## 2020-11-17 MED ORDER — LACTULOSE 10 GM/15ML PO SOLN
20.0000 g | ORAL | Status: DC
  Administered 2020-11-17 (×2): 20 g
  Filled 2020-11-17 (×2): qty 30

## 2020-11-17 MED ORDER — INSULIN DETEMIR 100 UNIT/ML ~~LOC~~ SOLN
14.0000 [IU] | Freq: Two times a day (BID) | SUBCUTANEOUS | Status: DC
  Administered 2020-11-17 – 2020-11-18 (×4): 14 [IU] via SUBCUTANEOUS
  Filled 2020-11-17 (×6): qty 0.14

## 2020-11-17 NOTE — Progress Notes (Signed)
Mg 1.7 Replaced per protocol

## 2020-11-17 NOTE — Progress Notes (Signed)
NAME:  Bradley Miles, MRN:  [id number], DOB:  05-Dec-1966, LOS: 2 ADMISSION DATE:  11/15/2020, CONSULTATION DATE:  11/15/2020 REFERRING MD:  Dr. Leonette Monarch, CHIEF COMPLAINT:  Hematemesis  Brief History   54 year old male with history of ongoing ETOH abuse (last drink 76/2), alcoholic cirrhosis, hepatic encephalopathy, and esophageal varices/ portal gastropathy s/p TIPS and BRTO 11/2019 presenting with one day history of hematemesis, black stools and increasing confusion.  Several episodes of hematemesis in ER.  Initial Hgb 13.2.  IR and GI consulted. CTA abd/ pelvis showed patent TIPS and no evidence of active arterial bleeding but appeared to have residual flow in a gastro renal shunt despite previous BRTO.  PCCM admitted.  Intubated and taken for upper endoscopy found to have normal esophagus and large volume clot the cardia, in the gastric fundus and in the proximal gastric body thought secondary to active proximal gastric bleeding, suspected bleeding gastric varices.  Afterwards on repeat CXR, noticed to have widened mediastinum and therefore went for CTA chest and then IR.  CTA chest showed no evidence for a hemothorax or mediastinal hematoma, showing left lower lung collapse suggestive of aspiration or mucous plugging.  TTE showed normal EF/ RV and no pericardial effusion.  Additionally, noted to have down trending Hgb with hypotension responding to fluids, vasopressors, and transfusion.  In IR, underwent successful transvenous obliteration of recurrent gastric varices and coil embolization of bifurcated gastro-renal shunt.   Past Medical History  ETOH abuse, alcoholic cirrhosis, hepatic encephalopathy, esophageal varices/ portal gastropathy s/p TIPS and BRTO 11/2019, tobacco abuse, DM, GERD, HTN, anxiety  Significant Hospital Events   12/7 Admit/ endo/ IR  Consults:  12/7 [facility]  12/7 IR  Procedures:  12/7 ETT >> 12/7 Upper endoscopy 12/7 R radial Aline >> 12/7 foley >> 12/7 R  subclavian CVL (IR) >> 12/7 Retrograde cannulation of gastro-renal shunt, Embolization of posterior bifurcation of gastro-renal shunt, Balloon and coil assisted retrograde transvenous obliteration of gastric varices   Significant Diagnostic Tests:  12/7 CTA abd/ pelvis >> VASCULAR 1. Stable appearance of the TIPS stent. The stent appears to be patent based on the venous phase imaging but this could be better characterized with a dedicated liver duplex ultrasound. 2. Multiple variceal embolization coils. However, there is evidence for residual varices at the gastric cardia region. Despite having had a BRTO procedure, there appears to be residual flow in a gastro renal shunt. Gastro renal shunt may be supplying the gastric cardia varices. 3. No evidence for active arterial bleeding. NON-VASCULAR 1. Cirrhosis with a small amount of ascites. Spleen is prominent for size. 2. Diffuse wall thickening in the colon, most prominent in the rectum and distal sigmoid colon region. Findings are concerning for colitis. Etiology is uncertain but this does represent a change since 08/05/2020. 3. Cholelithiasis.  12/7 upper endoscopy  >> - Normal esophagus - Large volume of clotted blood and red blood in the cardia, in the gastric fundus and in the proximal gastric body that could not be cleared due to the volume of active bleeding and large size of the clots. Active proximal gastric bleeding, suspected bleeding gastric varices. - Normal duodenal bulb and second portion of the duodenum. - No specimens collected.  12/7 TTE >> LVEF 60-65%; no wall abnormalities, G1DD, normal RV, normal valves, no pericardial effusion  12/7 CTA chest/ aorta >> 1. Complete collapse of the left lower lobe. This lobar collapse represents the opacities and changes seen on the recent chest radiograph. Left lower  lobe collapse may be related to mucous plugging or aspiration. 2. No evidence for a hemothorax or mediastinal hematoma. No  significant pleural fluid. 3. Limited evaluation for pulmonary embolism due to technical issues. However, there is no evidence for a large central pulmonary embolism. No acute abnormality of the thoracic aorta. 4.  Aortic Atherosclerosis   12/7 Retrograde cannulation of gastro-renal shunt, Embolization of posterior bifurcation of gastro-renal shunt, Balloon and coil assisted retrograde transvenous obliteration of gastric varices >> Successful transvenous obliteration of recurrent gastric varices with lipiodol, sotradecol, and air admixture.  Coil embolization of bifurcated gastro-renal shunt.  Micro Data:  12/7 SARS2/ Flu  >> neg  Antimicrobials:  12/7 ceftriaxone >>  Interim history/subjective:  Patient hemoglobin remained stable He remained drowsy despite off sedation for more than 24 hours His serum ammonia level was elevated  Objective   Blood pressure 139/65, pulse (!) 104, temperature 99.3 F (37.4 C), temperature source Axillary, resp. rate 13, weight 127.7 kg, SpO2 93 %.    Vent Mode: PRVC FiO2 (%):  [40 %] 40 % Set Rate:  [20 bmp-24 bmp] 24 bmp Vt Set:  [590 mL-680 mL] 590 mL PEEP:  [5 cmH20-6 cmH20] 5 cmH20 Plateau Pressure:  [16 cmH20-19 cmH20] 16 cmH20   Intake/Output Summary (Last 24 hours) at 11/17/2020 1101 Last data filed at 11/17/2020 1000 Gross per 24 hour  Intake 3351.58 ml  Output 1140 ml  Net 2211.58 ml   Filed Weights   11/16/20 0600 11/17/20 0233  Weight: 128 kg 127.7 kg   Examination: General:  Critically ill adult male, lying on the bed, orally intubated HEENT: MM pink/moist, ETT, pupils 3/reactive, scleral icterus and edema Neuro: Opens eyes with vocal stimuli, following intermittently, moving all 4 extremities spontaneously CV: rr, no murmur, NSR PULM: Reduced air entry at the bases, otherwise clear to auscultation bilaterally, no wheezes GI: obese, round, soft, bs active, foley  Extremities: warm/dry, +2 generalized edema  Resolved Hospital  Problem list    Assessment & Plan:  Acute upper GI bleeding due to recurrent gastric varices s/p coil embolization of bifurcated gastro-renal shunt in IR 12/7; prior TIPS and BRTO 11/2019- TIPS patent on imaging Acute blood loss anemia Decompensated alcoholic cirrhosis  ETOH abuse, ongoing  No more active bleeding after IR procedure Patient's H&H remained stable Monitor and transfuse if hemoglobin less than 7.  In total he is status post 2 PRBCs Patient's initial MELD 17 with 3 month 6% mortality rate Discontinue octreotide and Protonix infusion Continue IV Protonix twice daily Continue ceftriaxone for SBP/ GI prophylaxis  Acute hepatic encephalopathy Patient remained drowsy despite off sedation for more than 24 hours His serum ammonia level is elevated NG tube was placed this morning, continue lactulose 20 g every 2 hours until he has 2 bowel movements  Acute hypoxic respiratory failure likely due to aspiration pneumonitis Patient remained on ventilator, he is tolerating pressure support trial this morning Once his mental status improves, will try to extubate him  Poorly controlled diabetes type 2 Continue Lantus and sliding scale insulin Goal fingerstick 140-180   Tobacco abuse Nicotine patch prn  Best practice (evaluated daily)   Diet: NPO; until patient gets extubated, if he fails then will start tube feeds Pain/Anxiety/Delirium protocol (if indicated):  N/A VAP protocol (if indicated): yes DVT prophylaxis: SCDs GI prophylaxis: PPI Glucose control: SSI moderate/ levemir Mobility: Bedrest last date of multidisciplinary goals of care discussion: 12/7 Family and staff present: wife/ NP Summary of discussion: continue full medical care  Follow up goals of care discussion due 12/14 Code Status: Full, wife updated at bedside 12/9 Disposition: ICU  Labs   CBC: Recent Labs  Lab 11/15/20 0500 11/15/20 0955 11/16/20 0455 11/16/20 0830 11/16/20 1600 11/16/20 2000  11/17/20 0254  WBC 7.5  --  5.8  --   --   --  4.0  HGB 13.2   < > 8.5* 8.2* 8.6* 8.3* 7.9*  HCT 39.1   < > 26.8* 24.0* 26.3* 26.7* 24.6*  MCV 108.9*  --  108.9*  --   --   --  109.8*  PLT PLATELET CLUMPS NOTED ON SMEAR, UNABLE TO ESTIMATE  --  53*  --   --   --  57*   < > = values in this interval not displayed.    Basic Metabolic Panel: Recent Labs  Lab 11/15/20 0500 11/15/20 0954 11/15/20 1109 11/15/20 1434 11/15/20 1631 11/15/20 2148 11/16/20 0455 11/16/20 0800 11/16/20 0830 11/16/20 2000 11/17/20 0254  NA 140  --  143   < > 142 142 143  --  146*  --  148*  K 4.5  --  5.7*   < > 6.4* 4.9 4.9  --  4.8  --  3.8  CL 104  --  107  --   --  110 109  --   --   --  111  CO2 27  --   --   --   --  26 26  --   --   --  29  GLUCOSE 258*  --  190*  --   --  235* 254*  --   --   --  219*  BUN 12  --  16  --   --  15 20  --   --   --  30*  CREATININE 0.90  --  0.90  --   --  0.77 0.88  --   --   --  1.14  CALCIUM 8.3*  --   --   --   --  6.9* 6.8*  --   --   --  7.6*  MG  --  1.2*  --   --   --   --   --  1.1*  --  1.7 1.7  PHOS  --  3.8  --   --   --   --   --   --   --   --   --    < > = values in this interval not displayed.   GFR: Estimated Creatinine Clearance: 109.1 mL/min (by C-G formula based on SCr of 1.14 mg/dL). Recent Labs  Lab 11/15/20 0500 11/16/20 0455 11/17/20 0254  WBC 7.5 5.8 4.0    Liver Function Tests: Recent Labs  Lab 11/15/20 0500 11/16/20 0455 11/17/20 0254  AST 38 32 44*  ALT 18 12 16   ALKPHOS 111 64 61  BILITOT 2.6* 4.1* 2.8*  PROT 6.2* 4.7* 4.8*  ALBUMIN 2.5* 2.3* 2.3*   Recent Labs  Lab 11/15/20 0500  LIPASE 34   Recent Labs  Lab 11/15/20 0953 11/16/20 0753  AMMONIA 75* 265*    ABG    Component Value Date/Time   PHART 7.383 11/16/2020 0830   PCO2ART 48.3 (H) 11/16/2020 0830   PO2ART 93 11/16/2020 0830   HCO3 28.7 (H) 11/16/2020 0830   TCO2 30 11/16/2020 0830   ACIDBASEDEF 1.0 11/15/2020 1631   O2SAT 97.0 11/16/2020 0830      Coagulation Profile:  Recent Labs  Lab 11/15/20 0955 11/17/20 0254  INR 1.9* 1.7*    Cardiac Enzymes: No results for input(s): CKTOTAL, CKMB, CKMBINDEX, TROPONINI in the last 168 hours.  HbA1C: Hgb A1c MFr Bld  Date/Time Value Ref Range Status  10/24/2020 01:46 PM 8.4 (H) 4.8 - 5.6 % Final    Comment:             Prediabetes: 5.7 - 6.4          Diabetes: >6.4          Glycemic control for adults with diabetes: <7.0   06/20/2020 08:55 AM 6.9 (H) 4.8 - 5.6 % Final    Comment:             Prediabetes: 5.7 - 6.4          Diabetes: >6.4          Glycemic control for adults with diabetes: <7.0     CBG: Recent Labs  Lab 11/16/20 1943 11/16/20 2215 11/16/20 2359 11/17/20 0346 11/17/20 0729  GLUCAP 232* 213* 214* 212* 174*    Total critical care time: 41 minutes  Performed by: [place] care time was exclusive of separately billable procedures and treating other patients.   Critical care was necessary to treat or prevent imminent or life-threatening deterioration.   Critical care was time spent personally by me on the following activities: development of treatment plan with patient and/or surrogate as well as nursing, discussions with consultants, evaluation of patient's response to treatment, examination of patient, obtaining history from patient or surrogate, ordering and performing treatments and interventions, ordering and review of laboratory studies, ordering and review of radiographic studies, pulse oximetry and re-evaluation of patient's condition.   Jacky Kindle MD [place] [facility] Pager: [phone] Mobile: [phone]

## 2020-11-17 NOTE — Progress Notes (Signed)
Assisted tele visit to patient with wife.  Margaret Pyle, RN

## 2020-11-17 NOTE — Procedures (Signed)
Extubation Procedure Note  Patient Details:   Name: Bradley Miles DOB: Oct 24, 1966 MRN: [id number]   Airway Documentation:    Vent end date: 11/17/20 Vent end time: 1125   Evaluation  O2 sats: stable throughout Complications: No apparent complications Patient did tolerate procedure well. Bilateral Breath Sounds: Clear,Diminished   Yes   Pt extubated to 4L [place], eventually increased to 10L [facility]NC due to low spo2. Positive cuff leak noted prior to extubation. Pt able to to speak and has a strong productive cough post extubation. Pt encouraged to use [place] to clear secretions.   Jesse Sans 11/17/2020, 12:00 PM

## 2020-11-17 NOTE — Progress Notes (Signed)
Pt BP remained soft. Verbal order for 500cc LR bolus and Neo received during CHL downtime.

## 2020-11-18 ENCOUNTER — Other Ambulatory Visit: Payer: Self-pay | Admitting: Radiology

## 2020-11-18 DIAGNOSIS — J9600 Acute respiratory failure, unspecified whether with hypoxia or hypercapnia: Secondary | ICD-10-CM

## 2020-11-18 DIAGNOSIS — K920 Hematemesis: Secondary | ICD-10-CM

## 2020-11-18 DIAGNOSIS — I8501 Esophageal varices with bleeding: Secondary | ICD-10-CM

## 2020-11-18 DIAGNOSIS — K922 Gastrointestinal hemorrhage, unspecified: Secondary | ICD-10-CM

## 2020-11-18 LAB — CBC
HCT: 26 % — ABNORMAL LOW (ref 39.0–52.0)
Hemoglobin: 8.1 g/dL — ABNORMAL LOW (ref 13.0–17.0)
MCH: 34.9 pg — ABNORMAL HIGH (ref 26.0–34.0)
MCHC: 31.2 g/dL (ref 30.0–36.0)
MCV: 112.1 fL — ABNORMAL HIGH (ref 80.0–100.0)
Platelets: 47 10*3/uL — ABNORMAL LOW (ref 150–400)
RBC: 2.32 MIL/uL — ABNORMAL LOW (ref 4.22–5.81)
RDW: 19 % — ABNORMAL HIGH (ref 11.5–15.5)
WBC: 4.8 10*3/uL (ref 4.0–10.5)
nRBC: 0 % (ref 0.0–0.2)

## 2020-11-18 LAB — GLUCOSE, CAPILLARY
Glucose-Capillary: 104 mg/dL — ABNORMAL HIGH (ref 70–99)
Glucose-Capillary: 190 mg/dL — ABNORMAL HIGH (ref 70–99)
Glucose-Capillary: 60 mg/dL — ABNORMAL LOW (ref 70–99)
Glucose-Capillary: 194 mg/dL — ABNORMAL HIGH (ref 70–99)
Glucose-Capillary: 162 mg/dL — ABNORMAL HIGH (ref 70–99)
Glucose-Capillary: 199 mg/dL — ABNORMAL HIGH (ref 70–99)
Glucose-Capillary: 101 mg/dL — ABNORMAL HIGH (ref 70–99)

## 2020-11-18 LAB — COMPREHENSIVE METABOLIC PANEL
ALT: 17 U/L (ref 0–44)
AST: 62 U/L — ABNORMAL HIGH (ref 15–41)
Albumin: 2.3 g/dL — ABNORMAL LOW (ref 3.5–5.0)
Alkaline Phosphatase: 65 U/L (ref 38–126)
Anion gap: 9 (ref 5–15)
BUN: 20 mg/dL (ref 6–20)
CO2: 29 mmol/L (ref 22–32)
Calcium: 7.4 mg/dL — ABNORMAL LOW (ref 8.9–10.3)
Chloride: 108 mmol/L (ref 98–111)
Creatinine, Ser: 0.77 mg/dL (ref 0.61–1.24)
GFR, Estimated: >60 mL/min (ref >60)
Glucose, Bld: 106 mg/dL — ABNORMAL HIGH (ref 70–99)
Potassium: 3 mmol/L — ABNORMAL LOW (ref 3.5–5.1)
Sodium: 146 mmol/L — ABNORMAL HIGH (ref 135–145)
Total Bilirubin: 2.5 mg/dL — ABNORMAL HIGH (ref 0.3–1.2)
Total Protein: 4.9 g/dL — ABNORMAL LOW (ref 6.5–8.1)

## 2020-11-18 LAB — PROTIME-INR
INR: 1.6 — ABNORMAL HIGH (ref 0.8–1.2)
Prothrombin Time: 18.5 seconds — ABNORMAL HIGH (ref 11.4–15.2)

## 2020-11-18 LAB — TRIGLYCERIDES: Triglycerides: 91 mg/dL (ref <150)

## 2020-11-18 LAB — MAGNESIUM: Magnesium: 1.9 mg/dL (ref 1.7–2.4)

## 2020-11-18 MED ORDER — THIAMINE HCL 100 MG PO TABS
100.0000 mg | ORAL_TABLET | Freq: Every day | ORAL | Status: DC
Start: 2020-11-18 — End: 2020-11-20
  Administered 2020-11-18 – 2020-11-20 (×2): 100 mg via ORAL
  Filled 2020-11-18 (×3): qty 1

## 2020-11-18 MED ORDER — TRAZODONE HCL 50 MG PO TABS
50.0000 mg | ORAL_TABLET | Freq: Every evening | ORAL | Status: DC | PRN
  Administered 2020-11-18: 50 mg via ORAL
  Filled 2020-11-18: qty 1

## 2020-11-18 MED ORDER — POTASSIUM CHLORIDE 10 MEQ/50ML IV SOLN
10.0000 meq | INTRAVENOUS | Status: AC
  Administered 2020-11-18 (×4): 10 meq via INTRAVENOUS
  Filled 2020-11-18 (×4): qty 50

## 2020-11-18 MED ORDER — CLONIDINE HCL 0.1 MG PO TABS
0.1000 mg | ORAL_TABLET | Freq: Two times a day (BID) | ORAL | Status: DC
  Administered 2020-11-18 – 2020-11-20 (×5): 0.1 mg via ORAL
  Filled 2020-11-18 (×5): qty 1

## 2020-11-18 MED ORDER — POTASSIUM CHLORIDE 20 MEQ PO PACK
20.0000 meq | PACK | ORAL | Status: AC
  Administered 2020-11-18: 20 meq via ORAL
  Filled 2020-11-18: qty 1

## 2020-11-18 MED ORDER — FOLIC ACID 1 MG PO TABS
1.0000 mg | ORAL_TABLET | Freq: Every day | ORAL | Status: DC
  Administered 2020-11-18 – 2020-11-20 (×3): 1 mg via ORAL
  Filled 2020-11-18 (×3): qty 1

## 2020-11-18 MED ORDER — POLYETHYLENE GLYCOL 3350 17 G PO PACK
17.0000 g | PACK | Freq: Every day | ORAL | Status: DC
Start: 2020-11-18 — End: 2020-11-19
  Filled 2020-11-18: qty 1

## 2020-11-18 MED ORDER — DEXMEDETOMIDINE HCL IN NACL 400 MCG/100ML IV SOLN: 0.2000 ug/kg/h | INTRAVENOUS | Status: DC

## 2020-11-18 MED ORDER — POTASSIUM CHLORIDE 20 MEQ PO PACK
20.0000 meq | PACK | ORAL | Status: DC
  Administered 2020-11-18: 20 meq
  Filled 2020-11-18: qty 1

## 2020-11-18 MED ORDER — LORAZEPAM 2 MG/ML IJ SOLN
0.5000 mg | INTRAMUSCULAR | Status: DC | PRN
  Administered 2020-11-18 (×2): 0.5 mg via INTRAVENOUS
  Filled 2020-11-18 (×2): qty 1

## 2020-11-18 MED ORDER — INSULIN ASPART 100 UNIT/ML ~~LOC~~ SOLN
0.0000 [IU] | Freq: Three times a day (TID) | SUBCUTANEOUS | Status: DC
  Administered 2020-11-18 – 2020-11-19 (×3): 3 [IU] via SUBCUTANEOUS
  Administered 2020-11-19: 13:00:00 8 [IU] via SUBCUTANEOUS

## 2020-11-18 MED ORDER — TAB-A-VITE/IRON PO TABS
1.0000 | ORAL_TABLET | Freq: Every day | ORAL | Status: DC
  Administered 2020-11-18 – 2020-11-20 (×3): 1 via ORAL
  Filled 2020-11-18 (×3): qty 1

## 2020-11-18 MED ORDER — MAGNESIUM SULFATE 2 GM/50ML IV SOLN
2.0000 g | Freq: Once | INTRAVENOUS | Status: AC
  Administered 2020-11-18: 2 g via INTRAVENOUS
  Filled 2020-11-18: qty 50

## 2020-11-18 MED ORDER — THIAMINE HCL 100 MG/ML IJ SOLN
100.0000 mg | Freq: Every day | INTRAMUSCULAR | Status: DC
  Administered 2020-11-19: 11:00:00 100 mg via INTRAVENOUS
  Filled 2020-11-18: qty 2

## 2020-11-18 MED FILL — Phenylephrine HCl IV Soln 10 MG/ML: INTRAVENOUS | Qty: 10 | Status: AC

## 2020-11-18 MED FILL — Sodium Chloride IV Soln 0.9%: INTRAVENOUS | Qty: 250 | Status: AC

## 2020-11-18 NOTE — Progress Notes (Signed)
Nutrition Follow-up  DOCUMENTATION CODES:   Obesity unspecified  INTERVENTION:   Ensure Enlive po BID, each supplement provides 350 kcal and 20 grams of protein  NUTRITION DIAGNOSIS:   Inadequate oral intake related to acute illness,altered GI function as evidenced by NPO status.  Being addressed via diet advancement, supplements  GOAL:   Patient will meet greater than or equal to 90% of their needs  Progressing  MONITOR:   Vent status,Labs,Weight trends,I & O's  REASON FOR ASSESSMENT:   Ventilator    ASSESSMENT:   54 year old male who presented to the ED on 12/07 with hematemesis. PMH of ongoing EtOH abuse, alcoholic cirrhosis, hepatic encephalopathy, and esophageal varices/portal gastropathy s/p TIPS and BRTO 11/2019, tobacco abuse, DM, GERD, HTN, anxiety.   12/09 Extubated  Tolerated CL diet and advanced to Soft diet this AM PO intake 75-100% of meals, appetite good  Current wt 127.7 kg  Labs: sodium 146 (H), potassium 3.0 (L), CBGs 60-194 Meds: ss novolog, levemir, MVI with Iron, thiamine   Diet Order:   Diet Order            DIET SOFT Room service appropriate? Yes with Assist; Fluid consistency: Thin  Diet effective now                 EDUCATION NEEDS:   No education needs have been identified at this time  Skin:  Skin Assessment: Reviewed RN Assessment  Last BM:  12/10  Height:   Ht Readings from Last 1 Encounters:  11/10/20 6\' 3"  (1.905 m)    Weight:   Wt Readings from Last 1 Encounters:  11/17/20 127.7 kg    BMI:  Body mass index is 35.19 kg/m.  Estimated Nutritional Needs:   Kcal:  2100-2300 kcals  Protein:  110-130 g  Fluid:  >/= 2.0 L   Kerman Passey MS, RDN, LDN, CNSC Registered Dietitian III Clinical Nutrition RD Pager and On-Call Pager Number Located in [place]

## 2020-11-18 NOTE — Progress Notes (Signed)
NAME:  Bradley Miles, MRN:  [id number], DOB:  09-Oct-1966, LOS: 3 ADMISSION DATE:  11/15/2020, CONSULTATION DATE:  11/15/2020 REFERRING MD:  Dr. Leonette Monarch, CHIEF COMPLAINT:  Hematemesis  Brief History   54 year old male with history of ongoing ETOH abuse (last drink 40/1), alcoholic cirrhosis, hepatic encephalopathy, and esophageal varices/ portal gastropathy s/p TIPS and BRTO 11/2019 presenting with one day history of hematemesis, black stools and increasing confusion.  Several episodes of hematemesis in ER.  Initial Hgb 13.2.  IR and GI consulted. CTA abd/ pelvis showed patent TIPS and no evidence of active arterial bleeding but appeared to have residual flow in a gastro renal shunt despite previous BRTO.  PCCM admitted.  Intubated and taken for upper endoscopy found to have normal esophagus and large volume clot the cardia, in the gastric fundus and in the proximal gastric body thought secondary to active proximal gastric bleeding, suspected bleeding gastric varices.  Afterwards on repeat CXR, noticed to have widened mediastinum and therefore went for CTA chest and then IR.  CTA chest showed no evidence for a hemothorax or mediastinal hematoma, showing left lower lung collapse suggestive of aspiration or mucous plugging.  TTE showed normal EF/ RV and no pericardial effusion.  Additionally, noted to have down trending Hgb with hypotension responding to fluids, vasopressors, and transfusion.  In IR, underwent successful transvenous obliteration of recurrent gastric varices and coil embolization of bifurcated gastro-renal shunt.   Past Medical History  ETOH abuse, alcoholic cirrhosis, hepatic encephalopathy, esophageal varices/ portal gastropathy s/p TIPS and BRTO 11/2019, tobacco abuse, DM, GERD, HTN, anxiety  Significant Hospital Events   12/7 Admit/ endo/ IR  Consults:  12/7 [facility]  12/7 IR embolization of posterior bifurcation of gastro-renal shunt  Procedures:  12/7 ETT >> 11/17/2020 12/7  Upper endoscopy 12/7 R radial Aline >> 11/18/2020 12/7 foley >> 09/2020 12/7 R subclavian CVL (IR) >> 11/18/2020 12/7 Retrograde cannulation of gastro-renal shunt, Embolization of posterior bifurcation of gastro-renal shunt, Balloon and coil assisted retrograde transvenous obliteration of gastric varices   Significant Diagnostic Tests:  12/7 CTA abd/ pelvis >> VASCULAR 1. Stable appearance of the TIPS stent. The stent appears to be patent based on the venous phase imaging but this could be better characterized with a dedicated liver duplex ultrasound. 2. Multiple variceal embolization coils. However, there is evidence for residual varices at the gastric cardia region. Despite having had a BRTO procedure, there appears to be residual flow in a gastro renal shunt. Gastro renal shunt may be supplying the gastric cardia varices. 3. No evidence for active arterial bleeding. NON-VASCULAR 1. Cirrhosis with a small amount of ascites. Spleen is prominent for size. 2. Diffuse wall thickening in the colon, most prominent in the rectum and distal sigmoid colon region. Findings are concerning for colitis. Etiology is uncertain but this does represent a change since 08/05/2020. 3. Cholelithiasis.  12/7 upper endoscopy  >> - Normal esophagus - Large volume of clotted blood and red blood in the cardia, in the gastric fundus and in the proximal gastric body that could not be cleared due to the volume of active bleeding and large size of the clots. Active proximal gastric bleeding, suspected bleeding gastric varices. - Normal duodenal bulb and second portion of the duodenum. - No specimens collected.  12/7 TTE >> LVEF 60-65%; no wall abnormalities, G1DD, normal RV, normal valves, no pericardial effusion  12/7 CTA chest/ aorta >> 1. Complete collapse of the left lower lobe. This lobar collapse represents the  opacities and changes seen on the recent chest radiograph. Left lower lobe collapse may be related to  mucous plugging or aspiration. 2. No evidence for a hemothorax or mediastinal hematoma. No significant pleural fluid. 3. Limited evaluation for pulmonary embolism due to technical issues. However, there is no evidence for a large central pulmonary embolism. No acute abnormality of the thoracic aorta. 4.  Aortic Atherosclerosis   12/7 Retrograde cannulation of gastro-renal shunt, Embolization of posterior bifurcation of gastro-renal shunt, Balloon and coil assisted retrograde transvenous obliteration of gastric varices >> Successful transvenous obliteration of recurrent gastric varices with lipiodol, sotradecol, and air admixture.  Coil embolization of bifurcated gastro-renal shunt.  Micro Data:  12/7 SARS2/ Flu  >> neg  Antimicrobials:  12/7 ceftriaxone >>  Interim history/subjective:   Stable  Extubating doing well To stepdown unit and to [facility] service as of 11/19/2020  Objective   Blood pressure (!) 125/58, pulse 82, temperature 98.2 F (36.8 C), temperature source Oral, resp. rate 15, weight 127.7 kg, SpO2 96 %.        Intake/Output Summary (Last 24 hours) at 11/18/2020 5409 Last data filed at 11/18/2020 0800 Gross per 24 hour  Intake 4606.54 ml  Output 950 ml  Net 3656.54 ml   Filed Weights   11/16/20 0600 11/17/20 0233  Weight: 128 kg 127.7 kg   Examination: General: Well-nourished well-developed male no acute distress awake alert HEENT: MM pink/moist oromucosa is unremarkable Neuro: Intact without focal defect CV: Heart sounds regular regular rate rhythm PULM: Diminished in the bases GI: soft, bsx4 active nontender  Extremities: warm/dry, mild edema  Skin: no rashes or lesions   Resolved Hospital Problem list    Assessment & Plan:  Acute upper GI bleeding due to recurrent gastric varices s/p coil embolization of bifurcated gastro-renal shunt in IR 12/7; prior TIPS and BRTO 11/2019- TIPS patent on imaging Acute blood loss anemia Decompensated alcoholic  cirrhosis  ETOH abuse, ongoing Recent Labs    11/17/20 0254 11/18/20 0409  HGB 7.9* 8.1*   No further bleeding post interventional radiology embolization Monitor H&H Transfuse for hemoglobin less than 7 Continue proton pump inhibitor can transition to p.o. in the near future if okay with GI service Currently on ceftriaxone for GI prophylaxis Transfer out of the intensive care unit to stepdown unit and to [facility] service 11/19/2020    Acute hepatic encephalopathy Resolved awake and alert Acute hypoxic respiratory failure likely due to aspiration pneumonitis Extubating 11/17/2020 No acute distress on nasal cannula Wean FiO2 Pulmonary toilet as needed  Poorly controlled diabetes type 2 CBG (last 3)  Recent Labs    11/18/20 0323 11/18/20 0737 11/18/20 0813  GLUCAP 104* 60* 190*    Sliding-scale insulin protocol   Tobacco abuse Tobacco cessation  Best practice (evaluated daily)   Diet: NPO; until patient gets extubated, if he fails then will start tube feeds Pain/Anxiety/Delirium protocol (if indicated):  N/A VAP protocol (if indicated): yes DVT prophylaxis: SCDs GI prophylaxis: PPI Glucose control: SSI moderate/ levemir Mobility: Bedrest last date of multidisciplinary goals of care discussion: 12/7 Family and staff present: wife/ NP Summary of discussion: continue full medical care Follow up goals of care discussion due 12/14 Code Status: Full, wife updated at bedside 12/9 Disposition:  11/18/2020 transfer to stepdown unit and to [facility] service  Labs   CBC: Recent Labs  Lab 11/15/20 0500 11/15/20 0955 11/16/20 0455 11/16/20 0830 11/16/20 1600 11/16/20 2000 11/17/20 0254 11/18/20 0409  WBC 7.5  --  5.8  --   --   --  4.0 4.8  HGB 13.2   < > 8.5* 8.2* 8.6* 8.3* 7.9* 8.1*  HCT 39.1   < > 26.8* 24.0* 26.3* 26.7* 24.6* 26.0*  MCV 108.9*  --  108.9*  --   --   --  109.8* 112.1*  PLT PLATELET CLUMPS NOTED ON SMEAR, UNABLE TO ESTIMATE  --  53*  --   --   --   57* 47*   < > = values in this interval not displayed.    Basic Metabolic Panel: Recent Labs  Lab 11/15/20 0500 11/15/20 0954 11/15/20 1109 11/15/20 1750 11/15/20 2148 11/16/20 0455 11/16/20 0800 11/16/20 0830 11/16/20 2000 11/17/20 0254 11/18/20 0409  NA 140  --    < > 142 142 143  --  146*  --  148* 146*  K 4.5  --    < > 6.1* 4.9 4.9  --  4.8  --  3.8 3.0*  CL 104  --    < > 108 110 109  --   --   --  111 108  CO2 27  --   --   --  26 26  --   --   --  29 29  GLUCOSE 258*  --    < > 199* 235* 254*  --   --   --  219* 106*  BUN 12  --    < > 17 15 20   --   --   --  30* 20  CREATININE 0.90  --    < > 0.80 0.77 0.88  --   --   --  1.14 0.77  CALCIUM 8.3*  --   --   --  6.9* 6.8*  --   --   --  7.6* 7.4*  MG  --  1.2*  --   --   --   --  1.1*  --  1.7 1.7 1.9  PHOS  --  3.8  --   --   --   --   --   --   --   --   --    < > = values in this interval not displayed.   GFR: Estimated Creatinine Clearance: 155.5 mL/min (by C-G formula based on SCr of 0.77 mg/dL). Recent Labs  Lab 11/15/20 0500 11/16/20 0455 11/17/20 0254 11/18/20 0409  WBC 7.5 5.8 4.0 4.8    Liver Function Tests: Recent Labs  Lab 11/15/20 0500 11/16/20 0455 11/17/20 0254 11/18/20 0409  AST 38 32 44* 62*  ALT 18 12 16 17   ALKPHOS 111 64 61 65  BILITOT 2.6* 4.1* 2.8* 2.5*  PROT 6.2* 4.7* 4.8* 4.9*  ALBUMIN 2.5* 2.3* 2.3* 2.3*   Recent Labs  Lab 11/15/20 0500  LIPASE 34   Recent Labs  Lab 11/15/20 0953 11/16/20 0753  AMMONIA 75* 265*    ABG    Component Value Date/Time   PHART 7.383 11/16/2020 0830   PCO2ART 48.3 (H) 11/16/2020 0830   PO2ART 93 11/16/2020 0830   HCO3 28.7 (H) 11/16/2020 0830   TCO2 30 11/16/2020 0830   ACIDBASEDEF 1.0 11/15/2020 1631   O2SAT 97.0 11/16/2020 0830     Coagulation Profile: Recent Labs  Lab 11/15/20 0955 11/17/20 0254 11/18/20 0409  INR 1.9* 1.7* 1.6*    Cardiac Enzymes: No results for input(s): CKTOTAL, CKMB, CKMBINDEX, TROPONINI in the  last 168 hours.  HbA1C: Hgb A1c MFr Bld  Date/Time Value Ref Range Status  10/24/2020 01:46 PM 8.4 (H)  4.8 - 5.6 % Final    Comment:             Prediabetes: 5.7 - 6.4          Diabetes: >6.4          Glycemic control for adults with diabetes: <7.0   06/20/2020 08:55 AM 6.9 (H) 4.8 - 5.6 % Final    Comment:             Prediabetes: 5.7 - 6.4          Diabetes: >6.4          Glycemic control for adults with diabetes: <7.0     CBG: Recent Labs  Lab 11/17/20 1611 11/17/20 1923 11/17/20 2321 11/18/20 0323 11/18/20 0737  GLUCAP 128* 138* 119* 104* 60*    . Richardson Landry Kataleyah Carducci ACNP Acute Care Nurse Practitioner [place] Please consult [place] 11/18/2020, 8:40 AM

## 2020-11-18 NOTE — Progress Notes (Signed)
eLink Physician-Brief Progress Note Patient Name: CID AGENA DOB: May 01, 1966 MRN: [id number]   Date of Service  11/18/2020  HPI/Events of Note  Insomnia. Requests to resume trazodone (home med) for sleep.   eICU Interventions  Trazodone 50mg  QHS PRN sleep ordered.     Intervention Category Minor Interventions: Routine modifications to care plan (e.g. PRN medications for pain, fever)  Marily Lente Neeti Knudtson 11/18/2020, 8:31 PM

## 2020-11-18 NOTE — Progress Notes (Signed)
eLink Physician-Brief Progress Note Patient Name: Bradley Miles DOB: February 11, 1966 MRN: [id number]   Date of Service  11/18/2020  HPI/Events of Note  Patient with incipient Delirium Tremens.  eICU Interventions  CIWA protocol ordered.        Aniel Hubble U Chayla Shands 11/18/2020, 3:00 AM

## 2020-11-18 NOTE — Evaluation (Signed)
Clinical/Bedside Swallow Evaluation Patient Details  Name: Bradley Miles MRN: [id number] Date of Birth: 25-Feb-1966  Today's Date: 11/18/2020 Time: SLP Start Time (ACUTE ONLY): 7673 SLP Stop Time (ACUTE ONLY): 0928 SLP Time Calculation (min) (ACUTE ONLY): 11 min  Past Medical History:  Past Medical History:  Diagnosis Date  . Anxiety   . Controlled type 2 diabetes mellitus with complication, without long-term current use of insulin ([place]) 06/26/2020  . Enlarged liver   . GERD (gastroesophageal reflux disease)   . Hypertension   . Palpitations   . PVC's (premature ventricular contractions)   . Shortness of breath   . Tussive syncope 12/22/2019  . Varicose veins    Past Surgical History:  Past Surgical History:  Procedure Laterality Date  . BIOPSY  02/02/2019   Procedure: BIOPSY;  Surgeon: Daneil Dolin, MD;  [facility];  Service: Endoscopy;;  gastric  . COLONOSCOPY WITH ESOPHAGOGASTRODUODENOSCOPY (EGD)  12/16/2012   internal hemorrhoids, colonic diverticulosis, benign polyps, screening in 2024. EGD with mild erosive reflux esophagitis, small hiatal hernia, negative H.pylori  . cyst reomved     from spine  . ESOPHAGOGASTRODUODENOSCOPY  05/19/09   RMR: Geographic distal esophageal erosions consistent with severe erosive reflux esophagitis. schatzki's ring s/P dilation/small hiatal hernia otherwise normal stomach  . ESOPHAGOGASTRODUODENOSCOPY (EGD) WITH PROPOFOL N/A 02/02/2019   Dr. Gala Romney: retained gastric contents. portal HTN gastropathy  . ESOPHAGOGASTRODUODENOSCOPY (EGD) WITH PROPOFOL N/A 11/20/2019   Procedure: ESOPHAGOGASTRODUODENOSCOPY (EGD) WITH PROPOFOL;  Surgeon: Daneil Dolin, MD;  [facility];  Service: Endoscopy;  Laterality: N/A;  . ESOPHAGOGASTRODUODENOSCOPY (EGD) WITH PROPOFOL N/A 11/24/2019   Procedure: ESOPHAGOGASTRODUODENOSCOPY (EGD) WITH PROPOFOL;  Surgeon: Ronnette Juniper, MD;  Location: [place];  Service: Gastroenterology;  Laterality:  N/A;  . ESOPHAGOGASTRODUODENOSCOPY (EGD) WITH PROPOFOL N/A 08/08/2020   Procedure: ESOPHAGOGASTRODUODENOSCOPY (EGD) WITH PROPOFOL;  Surgeon: Daneil Dolin, MD;  [facility];  Service: Endoscopy;  Laterality: N/A;  . ESOPHAGOGASTRODUODENOSCOPY (EGD) WITH PROPOFOL N/A 11/15/2020   Procedure: ESOPHAGOGASTRODUODENOSCOPY (EGD) WITH PROPOFOL;  Surgeon: Ladene Artist, MD;  Location[facility];  Service: Endoscopy;  Laterality: N/A;  . IR ANGIOGRAM SELECTIVE EACH ADDITIONAL VESSEL  11/22/2019  . IR ANGIOGRAM SELECTIVE EACH ADDITIONAL VESSEL  11/24/2019  . IR ANGIOGRAM SELECTIVE EACH ADDITIONAL VESSEL  11/24/2019  . IR ANGIOGRAM SELECTIVE EACH ADDITIONAL VESSEL  11/24/2019  . IR ANGIOGRAM SELECTIVE EACH ADDITIONAL VESSEL  11/15/2020  . IR EMBO ART  VEN HEMORR LYMPH EXTRAV  INC GUIDE ROADMAPPING  11/22/2019  . IR EMBO ART  VEN HEMORR LYMPH EXTRAV  INC GUIDE ROADMAPPING  11/24/2019  . IR EMBO ART  VEN HEMORR LYMPH EXTRAV  INC GUIDE ROADMAPPING  11/15/2020  . IR FLUORO GUIDE CV LINE RIGHT  11/15/2020  . IR RADIOLOGIST EVAL & MGMT  12/29/2019  . IR RADIOLOGIST EVAL & MGMT  10/27/2020  . IR TIPS  11/24/2019  . IR US GUIDE VASC ACCESS RIGHT  11/22/2019  . IR US GUIDE VASC ACCESS RIGHT  11/15/2020  . IR US GUIDE VASC ACCESS RIGHT  11/15/2020  . IR VENOGRAM RENAL UNI LEFT  11/22/2019  . IR VENOGRAM RENAL UNI LEFT  11/15/2020  . RADIOLOGY WITH ANESTHESIA N/A 11/24/2019   Procedure: IR WITH ANESTHESIA;  Surgeon: Radiologist, Medication, MD;  Location: [place];  Service: Radiology;  Laterality: N/A;  . RADIOLOGY WITH ANESTHESIA N/A 11/15/2020   Procedure: IR WITH ANESTHESIA;  Surgeon: Suzette Battiest, MD;  Location: [place];  Service: Radiology;  Laterality: N/A;  . TIPS PROCEDURE N/A 11/22/2019   Procedure: BRTO/TRANS-JUGULAR INTRAHEPATIC PORTAL SHUNT (TIPS);  Surgeon: Corrie Mckusick, DO;  Location: [place];  Service: Anesthesiology;  Laterality: N/A;  . WISDOM TOOTH EXTRACTION     HPI:  Pt is a 54  year old male with history of ongoing ETOH abuse (last drink 76/2), alcoholic cirrhosis, hepatic encephalopathy, and esophageal varices/portal gastropathy s/p TIPS and BRTO 11/2019 who presented with one day history of hematemesis, black stools and increasing confusion. CTA abdomen/pelvis showed patent TIPS and no evidence of active arterial bleeding but appeared to have residual flow in a gastrorenal shunt despite previous BRTO.  GI consulted and EGD completed 12/7: normal esophagus. Clotted blood and red blood was found in the cardia, in the gastric fundus and in the proximal gastric body. It was a large volume of clot with active bleeding obscuring most of the proximal stomach. Gastric varices suspected and IR consulted for uncontrolled major UGI bleeding. Balloon and coil assisted retrograde transvenous obliteration completed 12/7. Pt intubated 12/7-12/9 and a clear liquid diet was initiated at that time.   Assessment / Plan / Recommendation Clinical Impression  Pt was seen for bedside swallow evaluation with his wife present. Both parties denied the pt having a history of oropharyngeal dysphagia but his wife stated that he has a remote history of esophageal stricture with dilitation. Oral mechanism exam was [facility] and dentition adequate. He tolerated all solids and liquids without signs or symptoms of oropharyngeal dysphagia. Pt may have up to a regular texture diet with thin liquids from an oropharyngeal standpoint. Case was discussed with Azucena Freed, PA with GI, and Judson Roch indicated that the pt may have up to a regular texture diet at this time; however, pt's RN requested that a soft diet be ordered due to gastric varices and this has therefore been placed. Further skilled SLP services are not clinically indicated for swallowing. SLP Visit Diagnosis: Dysphagia, unspecified (R13.10)    Aspiration Risk  No limitations    Diet Recommendation Regular;Thin liquid   Liquid Administration via:  Cup;Straw Medication Administration: Whole meds with liquid Supervision: Staff to assist with self feeding Postural Changes: Seated upright at 90 degrees    Other  Recommendations Oral Care Recommendations: Oral care BID   Follow up Recommendations None      Frequency and Duration            Prognosis Prognosis for Safe Diet Advancement: Good      Swallow Study   General Date of Onset: 11/17/20 HPI: Pt is a 54 year old male with history of ongoing ETOH abuse (last drink 83/1), alcoholic cirrhosis, hepatic encephalopathy, and esophageal varices/portal gastropathy s/p TIPS and BRTO 11/2019 who presented with one day history of hematemesis, black stools and increasing confusion. CTA abdomen/pelvis showed patent TIPS and no evidence of active arterial bleeding but appeared to have residual flow in a gastrorenal shunt despite previous BRTO.  GI consulted and EGD completed 12/7: normal esophagus. Clotted blood and red blood was found in the cardia, in the gastric fundus and in the proximal gastric body. It was a large volume of clot with active bleeding obscuring most of the proximal stomach. Gastric varices suspected and IR consulted for uncontrolled major UGI bleeding. Balloon and coil assisted retrograde transvenous obliteration completed 12/7. Pt intubated 12/7-12/9. Type of Study: Bedside Swallow Evaluation Previous Swallow Assessment: None Diet Prior to this Study: Thin liquids Temperature Spikes Noted: No Respiratory Status: Nasal cannula History of Recent Intubation: Yes  Length of Intubations (days): 2 days Date extubated: 11/17/20 Behavior/Cognition: Alert;Cooperative;Pleasant mood Oral Cavity Assessment: Within Functional Limits Oral Care Completed by SLP: No Oral Cavity - Dentition: Adequate natural dentition Vision: Functional for self-feeding Self-Feeding Abilities: Able to feed self Patient Positioning: Upright in bed Baseline Vocal Quality: Normal Volitional Cough:  Strong Volitional Swallow: Able to elicit    Oral/Motor/Sensory Function Overall Oral Motor/Sensory Function: Within functional limits   Ice Chips Ice chips: Within functional limits Presentation: Spoon   Thin Liquid Thin Liquid: Within functional limits Presentation: Straw    Nectar Thick Nectar Thick Liquid: Not tested   Honey Thick Honey Thick Liquid: Not tested   Puree Puree: Within functional limits Presentation: Spoon   Solid     Solid: Within functional limits Presentation: [place] [address]. Hardin Negus, [place], [place] Office number [phone] Pager [phone]  Horton Marshall 11/18/2020,9:39 AM

## 2020-11-18 NOTE — Plan of Care (Signed)
  Problem: Nutrition: Goal: Adequate nutrition will be maintained Outcome: Progressing   

## 2020-11-18 NOTE — Progress Notes (Addendum)
K+ 3.0, Mg 1.9 Replaced per protocol

## 2020-11-19 ENCOUNTER — Other Ambulatory Visit: Payer: Self-pay

## 2020-11-19 DIAGNOSIS — F10230 Alcohol dependence with withdrawal, uncomplicated: Secondary | ICD-10-CM

## 2020-11-19 DIAGNOSIS — K7200 Acute and subacute hepatic failure without coma: Secondary | ICD-10-CM

## 2020-11-19 DIAGNOSIS — J690 Pneumonitis due to inhalation of food and vomit: Secondary | ICD-10-CM

## 2020-11-19 DIAGNOSIS — K7030 Alcoholic cirrhosis of liver without ascites: Secondary | ICD-10-CM

## 2020-11-19 LAB — COMPREHENSIVE METABOLIC PANEL
ALT: 20 U/L (ref 0–44)
AST: 62 U/L — ABNORMAL HIGH (ref 15–41)
Albumin: 2.4 g/dL — ABNORMAL LOW (ref 3.5–5.0)
Alkaline Phosphatase: 77 U/L (ref 38–126)
Anion gap: 8 (ref 5–15)
BUN: 11 mg/dL (ref 6–20)
CO2: 25 mmol/L (ref 22–32)
Calcium: 7.9 mg/dL — ABNORMAL LOW (ref 8.9–10.3)
Chloride: 105 mmol/L (ref 98–111)
Creatinine, Ser: 0.84 mg/dL (ref 0.61–1.24)
GFR, Estimated: >60 mL/min (ref >60)
Glucose, Bld: 250 mg/dL — ABNORMAL HIGH (ref 70–99)
Potassium: 3.2 mmol/L — ABNORMAL LOW (ref 3.5–5.1)
Sodium: 138 mmol/L (ref 135–145)
Total Bilirubin: 1.9 mg/dL — ABNORMAL HIGH (ref 0.3–1.2)
Total Protein: 5.2 g/dL — ABNORMAL LOW (ref 6.5–8.1)

## 2020-11-19 LAB — GLUCOSE, CAPILLARY
Glucose-Capillary: 65 mg/dL — ABNORMAL LOW (ref 70–99)
Glucose-Capillary: 80 mg/dL (ref 70–99)
Glucose-Capillary: 158 mg/dL — ABNORMAL HIGH (ref 70–99)
Glucose-Capillary: 287 mg/dL — ABNORMAL HIGH (ref 70–99)
Glucose-Capillary: 156 mg/dL — ABNORMAL HIGH (ref 70–99)
Glucose-Capillary: 208 mg/dL — ABNORMAL HIGH (ref 70–99)

## 2020-11-19 LAB — BRAIN NATRIURETIC PEPTIDE: B Natriuretic Peptide: 107 pg/mL — ABNORMAL HIGH (ref 0.0–100.0)

## 2020-11-19 LAB — CBC WITH DIFFERENTIAL/PLATELET
Abs Immature Granulocytes: 0.01 10*3/uL (ref 0.00–0.07)
Basophils Absolute: 0 10*3/uL (ref 0.0–0.1)
Basophils Relative: 1 %
Eosinophils Absolute: 0.2 10*3/uL (ref 0.0–0.5)
Eosinophils Relative: 5 %
HCT: 28.3 % — ABNORMAL LOW (ref 39.0–52.0)
Hemoglobin: 9.3 g/dL — ABNORMAL LOW (ref 13.0–17.0)
Immature Granulocytes: 0 %
Lymphocytes Relative: 14 %
Lymphs Abs: 0.6 10*3/uL — ABNORMAL LOW (ref 0.7–4.0)
MCH: 35.2 pg — ABNORMAL HIGH (ref 26.0–34.0)
MCHC: 32.9 g/dL (ref 30.0–36.0)
MCV: 107.2 fL — ABNORMAL HIGH (ref 80.0–100.0)
Monocytes Absolute: 0.6 10*3/uL (ref 0.1–1.0)
Monocytes Relative: 14 %
Neutro Abs: 2.8 10*3/uL (ref 1.7–7.7)
Neutrophils Relative %: 66 %
Platelets: 42 10*3/uL — ABNORMAL LOW (ref 150–400)
RBC: 2.64 MIL/uL — ABNORMAL LOW (ref 4.22–5.81)
RDW: 17.3 % — ABNORMAL HIGH (ref 11.5–15.5)
WBC: 4.2 10*3/uL (ref 4.0–10.5)
nRBC: 0 % (ref 0.0–0.2)

## 2020-11-19 LAB — AMMONIA: Ammonia: 91 umol/L — ABNORMAL HIGH (ref 9–35)

## 2020-11-19 LAB — MAGNESIUM: Magnesium: 1.6 mg/dL — ABNORMAL LOW (ref 1.7–2.4)

## 2020-11-19 LAB — LACTIC ACID, PLASMA
Lactic Acid, Venous: 2.7 mmol/L (ref 0.5–1.9)
Lactic Acid, Venous: 3.7 mmol/L (ref 0.5–1.9)

## 2020-11-19 LAB — PHOSPHORUS: Phosphorus: 2.4 mg/dL — ABNORMAL LOW (ref 2.5–4.6)

## 2020-11-19 MED ORDER — INSULIN ASPART 100 UNIT/ML ~~LOC~~ SOLN: 0.0000 [IU] | Freq: Three times a day (TID) | SUBCUTANEOUS | Status: DC

## 2020-11-19 MED ORDER — POTASSIUM CHLORIDE CRYS ER 20 MEQ PO TBCR
40.0000 meq | EXTENDED_RELEASE_TABLET | Freq: Once | ORAL | Status: AC
  Administered 2020-11-19: 13:00:00 40 meq via ORAL
  Filled 2020-11-19: qty 2

## 2020-11-19 MED ORDER — GABAPENTIN 600 MG PO TABS
300.0000 mg | ORAL_TABLET | Freq: Three times a day (TID) | ORAL | Status: DC
  Administered 2020-11-19 (×3): 300 mg via ORAL
  Filled 2020-11-19 (×4): qty 0.5

## 2020-11-19 MED ORDER — POTASSIUM & SODIUM PHOSPHATES 280-160-250 MG PO PACK
1.0000 | PACK | Freq: Three times a day (TID) | ORAL | Status: DC
  Administered 2020-11-19 – 2020-11-20 (×3): 1 via ORAL
  Filled 2020-11-19 (×4): qty 1

## 2020-11-19 MED ORDER — SODIUM CHLORIDE 0.9 % IV BOLUS: 1000.0000 mL | Freq: Once | INTRAVENOUS | Status: DC

## 2020-11-19 MED ORDER — SODIUM CHLORIDE 0.9 % IV SOLN
1.0000 g | INTRAVENOUS | Status: DC
  Administered 2020-11-19: 13:00:00 1 g via INTRAVENOUS
  Filled 2020-11-19: qty 10

## 2020-11-19 MED ORDER — LACTULOSE 10 GM/15ML PO SOLN
20.0000 g | Freq: Every day | ORAL | Status: DC | PRN
  Administered 2020-11-19 – 2020-11-20 (×2): 20 g via ORAL
  Filled 2020-11-19 (×2): qty 30

## 2020-11-19 MED ORDER — MAGNESIUM SULFATE 2 GM/50ML IV SOLN
2.0000 g | Freq: Once | INTRAVENOUS | Status: AC
  Administered 2020-11-19: 17:00:00 2 g via INTRAVENOUS
  Filled 2020-11-19: qty 50

## 2020-11-19 MED ORDER — ACETAMINOPHEN 325 MG PO TABS: 650.0000 mg | ORAL_TABLET | Freq: Four times a day (QID) | ORAL | Status: DC | PRN

## 2020-11-19 MED ORDER — INSULIN DETEMIR 100 UNIT/ML ~~LOC~~ SOLN
5.0000 [IU] | Freq: Two times a day (BID) | SUBCUTANEOUS | Status: DC
  Administered 2020-11-19 – 2020-11-20 (×3): 5 [IU] via SUBCUTANEOUS
  Filled 2020-11-19 (×4): qty 0.05

## 2020-11-19 MED ORDER — SODIUM CHLORIDE 0.9 % IV BOLUS
1000.0000 mL | Freq: Once | INTRAVENOUS | Status: AC
  Administered 2020-11-19: 13:00:00 1000 mL via INTRAVENOUS

## 2020-11-19 MED ORDER — INSULIN ASPART 100 UNIT/ML ~~LOC~~ SOLN
0.0000 [IU] | Freq: Three times a day (TID) | SUBCUTANEOUS | Status: DC
  Administered 2020-11-19 – 2020-11-20 (×2): 2 [IU] via SUBCUTANEOUS

## 2020-11-19 MED ORDER — TRAZODONE HCL 50 MG PO TABS
100.0000 mg | ORAL_TABLET | Freq: Every day | ORAL | Status: DC
  Administered 2020-11-19: 23:00:00 100 mg via ORAL
  Filled 2020-11-19: qty 2

## 2020-11-19 MED ORDER — INSULIN ASPART 100 UNIT/ML ~~LOC~~ SOLN
0.0000 [IU] | Freq: Every day | SUBCUTANEOUS | Status: DC
  Administered 2020-11-19: 22:00:00 2 [IU] via SUBCUTANEOUS

## 2020-11-19 MED ORDER — VENLAFAXINE HCL 25 MG PO TABS
25.0000 mg | ORAL_TABLET | Freq: Two times a day (BID) | ORAL | Status: DC
  Administered 2020-11-19 – 2020-11-20 (×2): 25 mg via ORAL
  Filled 2020-11-19 (×2): qty 1

## 2020-11-19 MED ORDER — FUROSEMIDE 10 MG/ML IJ SOLN
40.0000 mg | Freq: Once | INTRAMUSCULAR | Status: AC
  Administered 2020-11-19: 20:00:00 40 mg via INTRAVENOUS
  Filled 2020-11-19: qty 4

## 2020-11-19 NOTE — Progress Notes (Signed)
Does not want to wear cpap at night.

## 2020-11-19 NOTE — Plan of Care (Signed)

## 2020-11-19 NOTE — Progress Notes (Addendum)
PROGRESS NOTE    Bradley Miles  NFA:[id number] DOB: Jul 22, 1966 DOA: 11/15/2020 PCP: Erven Colla, DO    Chief Complaint  Patient presents with  . Hematemesis    Brief Narrative:  54 year old male with PMH of alcoholic cirrhosis, portal hypertension, hepatic encephalopathy, and history of variceal bleeds s/p 11/2019 TIPS and BRTO who presents to ED on 12/7 with acute hematemesis, black tarry stool, and confusion after drinking alcohol.    12/7 Intubated. 12/7 CT angio showed patent TIPS but with residual flow into the gastric-renal shunt despite BRTO.  EGD demonstrated recurrent gastric varices but was unable to locate source of bleed.  IR performed embolization of gastro-renal shunt and retrograde transvenous obliteration of gastric varices. 12/9 Extubated. 12/11 Transferred to floors.   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  No acute bleeding. - Continue IV Protonix for today and transition to Protonix 40 mg PO BID tomorrow.  Hepatic Encephalopathy: Mental status is back to normal.  Abdomen is feels slightly distended. - Continue Lactulose with goal bowel movement 2-3 in the next 24 hours. - Continue prophylactic Lactulose at home.  Decompensated Alcoholic Cirrhosis: - Follow up with GI outpatient.  Aspiration Pneumonia - Has been on IV Ceftriaxone to prevent bacterial translocation since 12/7.  On Day 4 of antibiotics.  Continue for 3 more days to complete 7 day course to treat aspiration pneumonia.  Chronic Alcohol Use: Recently treated at [facility] facility in  [place]. - Discontinue Ativan.  Start Gabapentin and Clonidine to reduce alcohol cravings. - Continue multivitamins. - Counseled on alcohol cessation.  Follow up with Rehab facility outpatient.  Major Depressive Disorder: - Continue home Venlafaxine 25 mg.  Insomnia - Increase Trazodone from 50 to 100 mg nightly.  Diabetes Mellitus Type 2: - Glucose is running low.   - Decrease Levemir to 5 units BID and change sliding scale to sensitive.   - Encourage oral intake.  Morbid Obesity (BMI 35 kg/m2) / Suspected Sleep Apnea: - Recommend sleep study outpatient. - Recommend CPAP at night.   DVT prophylaxis: Lovenox Code Status: Full Family Communication: patient and wife at bedside. Disposition:   Status is: Inpatient  Remains inpatient appropriate because:Inpatient level of care appropriate due to severity of illness   Dispo: The patient is from: Home              Anticipated d/c is to: Home              Anticipated d/c date is: 1 day              Patient currently is not medically stable to d/c.    Consultants:   Critical Care  Procedures:   12/7 EGD (Dr. Therisa Doyne) Diminutive grade 1 varices found in lower third of esophagus. Red blood found in cardia and fundus.  Fresh blood and clots noted.  Despite suctioning and lavage, ongoing active bleeding noted, obscuring mucosal visualization. Portal hypertensive gastropathy Normal-appearing antrum, pylorus, duodenum.   12/7 Interventional Radiology 1) Ultrasound and fluoroscopic guided central line placement 2)  Retrograde cannulation of gastro-renal shunt 3) Embolization of posterior bifurcation of gastro-renal shunt 4) Balloon and coil assisted retrograde transvenous obliteration of gastric varices  Antimicrobials:   12/7 - current: IV Ceftriaxone 1g QD   Subjective: Patient is doing well.   Mental status is intact. No acute bleeding. Had trouble sleeping last night. Had one bowel movement this morning. Denies  any chest pain or shortness of breath. Right IJV and right arterial line have been removed.  Objective: Vitals:   11/19/20 1500 11/19/20 1551 11/19/20 1600 11/19/20 1700  BP:   128/71 138/70  Pulse: 84  (!) 125 92  Resp: 14  (!) 22 14  Temp:  98.4 F (36.9 C)    TempSrc:  Oral    SpO2: 97%  91% 95%  Weight:        Intake/Output Summary (Last 24 hours) at 11/19/2020 1722 Last data filed at 11/19/2020 1312 Gross per 24 hour  Intake 720 ml  Output 1425 ml  Net -705 ml   Filed Weights   11/16/20 0600 11/17/20 0233 11/19/20 0445  Weight: 128 kg 127.7 kg 128.3 kg    Examination:  General exam: Appears calm and comfortable, mental status normal Respiratory system: Clear to auscultation. Respiratory effort normal. Cardiovascular system: S1 & S2 heard, RRR. No JVD, did not appreciate a murmur Gastrointestinal system: Abdomen is nondistended, soft and nontender. N Central nervous system: Alert and oriented. No focal neurological deficits. Extremities: Symmetric 5 x 5 power. Skin: No rashes, lesions or ulcers Psychiatry: Judgement and insight appear normal. Mood & affect appropriate.    Data Reviewed: I have personally reviewed following labs and imaging studies  CBC: Recent Labs  Lab 11/15/20 0500 11/15/20 0955 11/16/20 0455 11/16/20 0830 11/16/20 1600 11/16/20 2000 11/17/20 0254 11/18/20 0409 11/19/20 1056  WBC 7.5  --  5.8  --   --   --  4.0 4.8 4.2  NEUTROABS  --   --   --   --   --   --   --   --  2.8  HGB 13.2   < > 8.5*   < > 8.6* 8.3* 7.9* 8.1* 9.3*  HCT 39.1   < > 26.8*   < > 26.3* 26.7* 24.6* 26.0* 28.3*  MCV 108.9*  --  108.9*  --   --   --  109.8* 112.1* 107.2*  PLT PLATELET CLUMPS NOTED ON SMEAR, UNABLE TO ESTIMATE  --  53*  --   --   --  57* 47* 42*   < > = values in this interval not displayed.    Basic Metabolic Panel: Recent Labs  Lab 11/15/20 0954 11/15/20 1109 11/15/20 2148 11/16/20 0455 11/16/20 0800 11/16/20 0830 11/16/20 2000  11/17/20 0254 11/18/20 0409 11/19/20 1056  NA  --    < > 142 143  --  146*  --  148* 146* 138  K  --    < > 4.9 4.9  --  4.8  --  3.8 3.0* 3.2*  CL  --    < > 110 109  --   --   --  111 108 105  CO2  --   --  26 26  --   --   --  29 29 25   GLUCOSE  --    < > 235* 254*  --   --   --  219* 106* 250*  BUN  --    < > 15 20  --   --   --  30* 20 11  CREATININE  --    < > 0.77 0.88  --   --   --  1.14 0.77 0.84  CALCIUM  --   --  6.9* 6.8*  --   --   --  7.6* 7.4* 7.9*  MG 1.2*  --   --   --  1.1*  --  1.7 1.7 1.9 1.6*  PHOS 3.8  --   --   --   --   --   --   --   --  2.4*   < > = values in this interval not displayed.    GFR: Estimated Creatinine Clearance: 148.4 mL/min (by C-G formula based on SCr of 0.84 mg/dL).  Liver Function Tests: Recent Labs  Lab 11/15/20 0500 11/16/20 0455 11/17/20 0254 11/18/20 0409 11/19/20 1056  AST 38 32 44* 62* 62*  ALT 18 12 16 17 20   ALKPHOS 111 64 61 65 77  BILITOT 2.6* 4.1* 2.8* 2.5* 1.9*  PROT 6.2* 4.7* 4.8* 4.9* 5.2*  ALBUMIN 2.5* 2.3* 2.3* 2.3* 2.4*    CBG: Recent Labs  Lab 11/19/20 0332 11/19/20 0353 11/19/20 0745 11/19/20 1205 11/19/20 1619  GLUCAP 65* 80 158* 287* 156*     Recent Results (from the past 240 hour(s))  Resp Panel by RT-PCR (Flu A&B, Covid) Nasopharyngeal Swab     Status: None   Collection Time: 11/15/20  8:18 AM   Specimen: Nasopharyngeal Swab; Nasopharyngeal(NP) swabs in vial transport medium  Result Value Ref Range Status   SARS Coronavirus 2 by RT PCR NEGATIVE NEGATIVE Final    Comment: (NOTE) SARS-CoV-2 target nucleic acids are NOT DETECTED.  The SARS-CoV-2 RNA is generally detectable in upper respiratory specimens during the acute phase of infection. The lowest concentration of SARS-CoV-2 viral copies this assay can detect is 138 copies/mL. A negative result does not preclude SARS-Cov-2 infection and should not be used as the sole basis for treatment or other patient management decisions. A negative  result may occur with  improper specimen collection/handling, submission of specimen other than nasopharyngeal swab, presence of viral mutation(s) within the areas targeted by this assay, and inadequate number of viral copies(<138 copies/mL). A negative result must be combined with clinical observations, patient history, and epidemiological information. The expected result is Negative.  Fact Sheet for Patients:  EntrepreneurPulse.com.au  Fact Sheet for Healthcare Providers:  IncredibleEmployment.be  This test is no t yet approved or cleared by the [place] FDA and  has been authorized for detection and/or diagnosis of SARS-CoV-2 by FDA under an Emergency Use Authorization (EUA). This EUA will remain  in effect (meaning this test can be used) for the duration of the COVID-19 declaration under Section 564(b)(1) of the Act, 21 U.S.C.section 360bbb-3(b)(1), unless the authorization is terminated  or revoked sooner.       Influenza A by PCR NEGATIVE NEGATIVE Final   Influenza B by PCR NEGATIVE NEGATIVE Final    Comment: (NOTE) The Xpert Xpress SARS-CoV-2/FLU/RSV plus assay is intended as an aid in the diagnosis of influenza from Nasopharyngeal swab specimens and should not be used as a sole basis for treatment. Nasal washings and aspirates are unacceptable for Xpert Xpress SARS-CoV-2/FLU/RSV testing.  Fact Sheet for Patients: EntrepreneurPulse.com.au  Fact Sheet for Healthcare Providers: IncredibleEmployment.be  This test is not yet approved or cleared by the [place] FDA and has been authorized for detection and/or diagnosis of SARS-CoV-2 by FDA under an Emergency Use Authorization (EUA). This EUA will remain  in effect (meaning this test can be used) for the duration of the COVID-19 declaration under Section 564(b)(1) of the Act, 21 U.S.C. section 360bbb-3(b)(1), unless the authorization is  terminated or revoked.  Performed at [facility], [place] [address], [place], [place] [address]     Radiology Studies: No results found.   Scheduled Meds: . Chlorhexidine Gluconate Cloth  6 each Topical Daily  . cloNIDine  0.1 mg Oral BID  . folic acid  1 mg Oral Daily  . gabapentin  300 mg Oral TID  . insulin aspart  0-5 Units Subcutaneous QHS  . insulin aspart  0-9 Units Subcutaneous TID WC  . insulin detemir  5 Units Subcutaneous BID  . multivitamins with iron  1 tablet Oral Daily  . pantoprazole (PROTONIX) IV  40 mg Intravenous Q12H  . potassium & sodium phosphates  1 packet Oral TID WC & HS  . sodium chloride flush  10-40 mL Intracatheter Q12H  . thiamine  100 mg Oral Daily   Or  . thiamine  100 mg Intravenous Daily  . traZODone  100 mg Oral QHS  . venlafaxine  25 mg Oral BID   Continuous Infusions: . cefTRIAXone (ROCEPHIN)  IV 1 g (11/19/20 1313)  . magnesium sulfate bolus IVPB 2 g (11/19/20 1642)     LOS: 4 days    Time spent: >30 minutes   George Hugh, MD [facility]

## 2020-11-19 NOTE — Evaluation (Signed)
Physical Therapy Evaluation Patient Details Name: Bradley Miles MRN: [id number] DOB: [birth year] Today's Date: 11/19/2020   History of Present Illness  54 year old male with history of ongoing ETOH abuse (last drink 30/0), alcoholic cirrhosis, hepatic encephalopathy, and esophageal varices/ portal gastropathy s/p TIPS and BRTO 11/2019 presenting with one day history of hematemesis, black stools and increasing confusion.  Several episodes of hematemesis in ER. On repeat CXR, noticed to have widened mediastinum and therefore went for CTA chest and then IR.  CTA chest showed no evidence for a hemothorax or mediastinal hematoma, showing left lower lung collapse suggestive of aspiration or mucous plugging.  TTE showed normal EF/ RV and no pericardial effusion.  Additionally, noted to have down trending Hgb with hypotension responding to fluids, vasopressors, and transfusion.  In IR, underwent successful transvenous obliteration of recurrent gastric varices and coil embolization of bifurcated gastro-renal shunt.  Clinical Impression  Pt is  close to baseline functioning and should be safe at home with available assist. There are no further acute PT needs.  Will sign off at this time.     Follow Up Recommendations No PT follow up    Equipment Recommendations  None recommended by PT    Recommendations for Other Services       Precautions / Restrictions Precautions Precautions: Fall Precaution Comments: watch HR Restrictions Weight Bearing Restrictions: No      Mobility  Bed Mobility Overal bed mobility: Needs Assistance Bed Mobility: Supine to Sit;Sit to Supine     Supine to sit: Supervision Sit to supine: Supervision   General bed mobility comments: supervision for line management    Transfers Overall transfer level: Needs assistance Equipment used: None Transfers: Sit to/from Stand Sit to Stand: Supervision         General transfer comment: supervision for line  management and safety only  Ambulation/Gait Ambulation/Gait assistance: Supervision Gait Distance (Feet): 250 Feet   Gait Pattern/deviations: Step-through pattern   Gait velocity interpretation: 1.31 - 2.62 ft/sec, indicative of limited community ambulator General Gait Details: generally steady, but slower and guarded overall  Stairs Stairs: Yes Stairs assistance: Supervision Stair Management: One rail Right;Forwards;Alternating pattern Number of Stairs: 2 General stair comments: safe with the rail  Wheelchair Mobility    Modified Rankin (Stroke Patients Only)       Balance Overall balance assessment: No apparent balance deficits (not formally assessed)                                           Pertinent Vitals/Pain Pain Assessment: Faces Faces Pain Scale: Hurts a little bit Pain Location: abdomen Pain Descriptors / Indicators: Discomfort;Tightness Pain Intervention(s): Monitored during session    Home Living Family/patient expects to be discharged to:: Private residence Living Arrangements: Spouse/significant other;Children (71 and 48 year old children) Available Help at Discharge: Family;Available 24 hours/day Type of Home: House Home Access: Stairs to enter   [employer] of Steps: 5 Home Layout: Laundry or work area in basement;Multi-level (woodstove in basement) Home Equipment: Civil engineer, contracting      Prior Function Level of Independence: Independent         Comments: works Engineer, civil (consulting) and lifting loads up to [place]   Dominant Hand: Right    Extremity/Trunk Assessment   Upper Extremity Assessment Upper Extremity Assessment: Overall WFL for tasks assessed    Lower  Extremity Assessment Lower Extremity Assessment: Overall WFL for tasks assessed    Cervical / Trunk Assessment Cervical / Trunk Assessment: Normal  Communication   Communication: No difficulties  Cognition Arousal/Alertness:  Awake/alert Behavior During Therapy: WFL for tasks assessed/performed Overall Cognitive Status: Within Functional Limits for tasks assessed                                        General Comments General comments (skin integrity, edema, etc.): HR initially 108, and rose to 125 with slight activity, DOE 2/4 with SpO2 95% and higher.  During gait HR rose higher to 135 bpm    Exercises     Assessment/Plan    PT Assessment Patent does not need any further PT services  PT Problem List         PT Treatment Interventions      PT Goals (Current goals can be found in the Care Plan section)  Acute Rehab PT Goals Patient Stated Goal: to get home PT Goal Formulation: All assessment and education complete, DC therapy    Frequency     Barriers to discharge        Co-evaluation               AM-PAC PT "6 Clicks" Mobility  Outcome Measure Help needed turning from your back to your side while in a flat bed without using bedrails?: None Help needed moving from lying on your back to sitting on the side of a flat bed without using bedrails?: None Help needed moving to and from a bed to a chair (including a wheelchair)?: None Help needed standing up from a chair using your arms (e.g., wheelchair or bedside chair)?: None Help needed to walk in hospital room?: A Little Help needed climbing 3-5 steps with a railing? : A Little 6 Click Score: 22    End of Session   Activity Tolerance: Patient tolerated treatment well Patient left: with call bell/phone within reach;in bed;with family/visitor present Nurse Communication: Mobility status PT Visit Diagnosis: Other abnormalities of gait and mobility (R26.89)    Time: 7628-3151 PT Time Calculation (min) (ACUTE ONLY): 37 min   Charges:   PT Evaluation $PT Eval Moderate Complexity: 1 Mod PT Treatments $Gait Training: 8-22 mins        11/19/2020  Ginger Carne., PT Acute Rehabilitation Services [phone]   (pager) [phone]  (office)  Tessie Fass Sukari Grist 11/19/2020, 5:13 PM

## 2020-11-19 NOTE — Evaluation (Signed)
Occupational Therapy Evaluation Patient Details Name: Bradley Miles MRN: [id number] DOB: 12-23-1965 Today's Date: 11/19/2020    History of Present Illness 54 year old male with history of ongoing ETOH abuse (last drink 02/5), alcoholic cirrhosis, hepatic encephalopathy, and esophageal varices/ portal gastropathy s/p TIPS and BRTO 11/2019 presenting with one day history of hematemesis, black stools and increasing confusion.  Several episodes of hematemesis in ER. On repeat CXR, noticed to have widened mediastinum and therefore went for CTA chest and then IR.  CTA chest showed no evidence for a hemothorax or mediastinal hematoma, showing left lower lung collapse suggestive of aspiration or mucous plugging.  TTE showed normal EF/ RV and no pericardial effusion.  Additionally, noted to have down trending Hgb with hypotension responding to fluids, vasopressors, and transfusion.  In IR, underwent successful transvenous obliteration of recurrent gastric varices and coil embolization of bifurcated gastro-renal shunt.   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 Today Pt is able to perform transfers and in room mobility at supervision level due to lines. He was able to demonstrate ADL at baseline. They have a shower chair at home for safety while bathing. Pt HR initially at 108 and rose to 125 with slight activity in room, SpO2 remained above 95% throughout session, Education complete and OT to sign off at this time.     Follow Up Recommendations  No OT follow up    Equipment Recommendations  None recommended by OT (Pt has appropriate DME at home)    Recommendations for Other Services       Precautions / Restrictions Precautions Precautions: Fall Precaution Comments: watch HR Restrictions Weight Bearing Restrictions: No      Mobility Bed Mobility Overal bed mobility: Needs Assistance Bed Mobility: Supine to  Sit;Sit to Supine     Supine to sit: Supervision Sit to supine: Supervision   General bed mobility comments: supervision for line management    Transfers Overall transfer level: Needs assistance Equipment used: None Transfers: Sit to/from Stand Sit to Stand: Supervision         General transfer comment: supervision for line management and safety only    Balance Overall balance assessment: No apparent balance deficits (not formally assessed)                                         ADL either performed or assessed with clinical judgement   ADL Overall ADL's : At baseline                                       General ADL Comments: able to perform transfers, peri care, standing balance for ADL - a little extra effort to reach BLE for bathing/dressing but able to complete     Vision Baseline Vision/History: Wears glasses Wears Glasses: At all times Patient Visual Report: No change from baseline Vision Assessment?: No apparent visual deficits     Perception     Praxis      Pertinent Vitals/Pain Pain Assessment: Faces Faces Pain Scale: Hurts a little bit Pain Location: abdomen Pain Descriptors / Indicators: Discomfort;Tightness Pain Intervention(s): Monitored during session;Repositioned     Hand Dominance Right   Extremity/Trunk Assessment Upper Extremity Assessment Upper Extremity  Assessment: Overall WFL for tasks assessed   Lower Extremity Assessment Lower Extremity Assessment: Defer to PT evaluation   Cervical / Trunk Assessment Cervical / Trunk Assessment: Normal   Communication Communication Communication: No difficulties   Cognition Arousal/Alertness: Awake/alert Behavior During Therapy: WFL for tasks assessed/performed Overall Cognitive Status: Within Functional Limits for tasks assessed                                     General Comments  HR initially 108, and rose to 125 with slight activity,  DOE 2/4 with SpO2 95% and higher    Exercises     Shoulder Instructions      Home Living Family/patient expects to be discharged to:: Private residence Living Arrangements: Spouse/significant other;Children (2 and 27 year old children) Available Help at Discharge: Family;Available 24 hours/day Type of Home: House Home Access: Stairs to enter [employer] of Steps: 5   Home Layout: Laundry or work area in basement;Multi-level (woodstove in basement) Alternate Therapist, sports of Steps: flight Alternate Level Stairs-Rails: Right Bathroom Shower/Tub: Occupational psychologist: Standard     Home Equipment: Civil engineer, contracting          Prior Functioning/Environment Level of Independence: Independent        Comments: works Engineer, civil (consulting) and lifting loads up to [employer]        OT Problem List: Decreased activity tolerance;Obesity      OT Treatment/Interventions:      OT Goals(Current goals can be found in the care plan section) Acute Rehab OT Goals Patient Stated Goal: to get home OT Goal Formulation: With patient/family Time For Goal Achievement: 12/03/20 Potential to Achieve Goals: Good  OT Frequency:     Barriers to D/C:            Co-evaluation              AM-PAC OT "6 Clicks" Daily Activity     Outcome Measure Help from another person eating meals?: None Help from another person taking care of personal grooming?: None Help from another person toileting, which includes using toliet, bedpan, or urinal?: None Help from another person bathing (including washing, rinsing, drying)?: None Help from another person to put on and taking off regular upper body clothing?: None Help from another person to put on and taking off regular lower body clothing?: A Little 6 Click Score: 23   End of Session Equipment Utilized During Treatment: Gait belt Nurse Communication: Mobility status;Precautions  Activity Tolerance: Patient tolerated treatment  well Patient left: Other (comment) (walking to stairs with PT)  OT Visit Diagnosis: Muscle weakness (generalized) (M62.81)                Time: 7681-1572 OT Time Calculation (min): 21 min Charges:  OT General Charges $OT Visit: 1 Visit OT Evaluation $OT Eval Moderate Complexity: [place] OTR/L Acute Rehabilitation Services Pager: [phone] Office: [place] 11/19/2020, 4:00 PM

## 2020-11-20 DIAGNOSIS — F419 Anxiety disorder, unspecified: Secondary | ICD-10-CM

## 2020-11-20 DIAGNOSIS — F1010 Alcohol abuse, uncomplicated: Secondary | ICD-10-CM

## 2020-11-20 DIAGNOSIS — F1029 Alcohol dependence with unspecified alcohol-induced disorder: Secondary | ICD-10-CM

## 2020-11-20 DIAGNOSIS — E118 Type 2 diabetes mellitus with unspecified complications: Secondary | ICD-10-CM

## 2020-11-20 DIAGNOSIS — K766 Portal hypertension: Secondary | ICD-10-CM

## 2020-11-20 DIAGNOSIS — I10 Essential (primary) hypertension: Secondary | ICD-10-CM

## 2020-11-20 LAB — CBC
HCT: 26.9 % — ABNORMAL LOW (ref 39.0–52.0)
Hemoglobin: 9.1 g/dL — ABNORMAL LOW (ref 13.0–17.0)
MCH: 35.7 pg — ABNORMAL HIGH (ref 26.0–34.0)
MCHC: 33.8 g/dL (ref 30.0–36.0)
MCV: 105.5 fL — ABNORMAL HIGH (ref 80.0–100.0)
Platelets: 60 10*3/uL — ABNORMAL LOW (ref 150–400)
RBC: 2.55 MIL/uL — ABNORMAL LOW (ref 4.22–5.81)
RDW: 17.5 % — ABNORMAL HIGH (ref 11.5–15.5)
WBC: 4.3 10*3/uL (ref 4.0–10.5)
nRBC: 0 % (ref 0.0–0.2)

## 2020-11-20 LAB — BASIC METABOLIC PANEL
Anion gap: 8 (ref 5–15)
BUN: 5 mg/dL — ABNORMAL LOW (ref 6–20)
CO2: 26 mmol/L (ref 22–32)
Calcium: 8.1 mg/dL — ABNORMAL LOW (ref 8.9–10.3)
Chloride: 103 mmol/L (ref 98–111)
Creatinine, Ser: 0.74 mg/dL (ref 0.61–1.24)
GFR, Estimated: >60 mL/min (ref >60)
Glucose, Bld: 170 mg/dL — ABNORMAL HIGH (ref 70–99)
Potassium: 3.4 mmol/L — ABNORMAL LOW (ref 3.5–5.1)
Sodium: 137 mmol/L (ref 135–145)

## 2020-11-20 LAB — GLUCOSE, CAPILLARY: Glucose-Capillary: 167 mg/dL — ABNORMAL HIGH (ref 70–99)

## 2020-11-20 MED ORDER — POTASSIUM CHLORIDE CRYS ER 20 MEQ PO TBCR
40.0000 meq | EXTENDED_RELEASE_TABLET | Freq: Once | ORAL | Status: AC
  Administered 2020-11-20: 09:00:00 40 meq via ORAL
  Filled 2020-11-20: qty 2

## 2020-11-20 MED ORDER — THIAMINE HCL 100 MG PO TABS: 100.0000 mg | ORAL_TABLET | Freq: Every day | ORAL | 1 refills | Status: DC

## 2020-11-20 MED ORDER — GABAPENTIN 300 MG PO CAPS
300.0000 mg | ORAL_CAPSULE | Freq: Three times a day (TID) | ORAL | Status: DC
  Administered 2020-11-20: 09:00:00 300 mg via ORAL
  Filled 2020-11-20: qty 1

## 2020-11-20 MED ORDER — GABAPENTIN 300 MG PO CAPS: 300.0000 mg | ORAL_CAPSULE | Freq: Three times a day (TID) | ORAL | 1 refills | Status: DC

## 2020-11-20 NOTE — Progress Notes (Signed)
Administered scheduled morning meds. Pt was up walking around bed. Educated on bed alarm and importance of calling for help. Spouse stated "we'll just unplug it, we're very noncompliant". Bed noted to be unplugged at that time. Educated spouse on the increased risk for falling when being noncompliant.No evidence of understanding from either patient or spouse. Bed alarm reset with patient in the bed prior to leaving the room. Discharge to home pending paperwork. Pt and spouse aware of plan of care.

## 2020-11-20 NOTE — Progress Notes (Signed)
During change of shift and hand off report, patient and spouse were stating patient would be showering. Off going nurse strongly suggested that patient not shower due to some dizziness last evening. Spouse stated, "I don't care. I'm showering him. I've been through this for months. You have no idea". Pt was showered by spouse. Also being non compliant with bed alarm and calling for help. Getting up independently despite bed alarm and education on risk for fall. MD aware and is discharging patient to home at this time.

## 2020-11-20 NOTE — Discharge Summary (Signed)
Physician Discharge Summary  Bradley Miles NLG:[id number] DOB: 01/06/1966 DOA: 11/15/2020  PCP: Erven Colla, DO  Admit date: 11/15/2020 Discharge date: 11/20/2020  Admitted From: Home Disposition: Home  Recommendations for Outpatient Follow-up:  1. Follow ups as below. 2. Please obtain CBC/BMP/Mag at follow up 3. May consider adding insulin for his diabetes. 4. Please follow up on the following pending results: None  Home Health: None required Equipment/Devices: Patient has appropriate DME's.  Discharge Condition: Stable CODE STATUS: Full code   Follow-up [facility] Follow up.   Why: Outpatient follow up (tele-visit) in approximately one month with Dr. Serafina Royals. A scheduler from our office will call to arrange this appointment.  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    Patient was intubated and admitted to ICU on 11/15/2020. CT angio showed patent TIPS but with residual flow into the gastric-renal shunt despite BRTO.  EGD demonstrated recurrent gastric varices but was unable to locate source of bleed.  IR performed embolization of gastro-renal shunt and retrograde transvenous obliteration of gastric varices.  He was also started on IV ceftriaxone for possible aspiration pneumonia.  Eventually extubated on 11/17/2020.  He was transferred out of ICU to regular floor on 11/19/2020, and remained stable since then.  Discharge to follow-up with PCP and gastroenterology in 1 to 2 weeks.  He was encouraged to quit drinking alcohol.  He was also encouraged to take his lactulose and  other medications as prescribed.  Of note, patient noted to be on Paxil and low-dose Effexor.  Encouraged to discuss this with his PCP or his prescribers.  Evaluated by therapy and no need was identified.  See individual problem list below for more on hospital course.  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  No acute bleeding.  Denies melena or hematochezia. -Received IV Protonix and discharged on home Dexilant. -He will continue his on nadolol  Hepatic Encephalopathy:  Encephalopathy resolved.  Back to his baseline. -Discharged on home lactulose for goal of 3-4 bowel movements a day -He will continue his rifaximin for SBP prophylaxis.  Decompensated Alcoholic Cirrhosis: -Outpatient follow-up with GI  Aspiration Pneumonia: Received ceftriaxone for 5 days.  No further respiratory symptoms.  Chronic Alcohol Use: recently treated at [facility] in [place]. -Encouraged to quit drinking alcohol. -Discharged on gabapentin 300 mg 3 times daily. -Continue folic acid and thiamine -Follow up with Rehab facility outpatient.  Major Depressive Disorder: Stable.  Noted that he is on Paxil and low-dose Effexor -Advised to clarify this with his PCP of prescribers.  Insomnia -Discharged on home medications and gabapentin.  Uncontrolled NIDDM-2 with hyperglycemia: A1c 8.4%. Recent Labs  Lab 11/19/20 0745 11/19/20 1205 11/19/20 1619 11/19/20 2140 11/20/20 0635  GLUCAP 158* 287* 156* 208* 167*  -Discharged on home Januvia -Maintain insulin -Counseled on lifestyle and cutting down on his carbs.  Morbid Obesity in the setting of diabetes Body mass index is 35.55 kg/m. Nutrition Problem: Inadequate oral intake Etiology: acute illness,altered GI function Signs/Symptoms: NPO  status Interventions: Refer to RD note for recommendations      Discharge Exam: Vitals:    11/20/20 0654 11/20/20 0845  BP: 140/81 (!) 157/69  Pulse: (!) 108 100  Resp: 16 18  Temp: 98.7 F (37.1 C) 98.5 F (36.9 C)  SpO2:  96%    GENERAL: No apparent distress.  Nontoxic. HEENT: MMM.  Vision and hearing grossly intact.  NECK: Supple.  No apparent JVD.  RESP:  No IWOB.  Fair aeration bilaterally. CVS:  RRR. Heart sounds normal.  ABD/GI/GU: Bowel sounds present. Soft.  Slightly distended but nontender. MSK/EXT:  Moves extremities. No apparent deformity. No edema.  SKIN: no apparent skin lesion or wound NEURO: Awake, alert and oriented appropriately.  No apparent focal neuro deficit. PSYCH: Calm. Normal affect.   Discharge Instructions  Discharge Instructions    Call MD for:  difficulty breathing, headache or visual disturbances   Complete by: As directed    Call MD for:  extreme fatigue   Complete by: As directed    Call MD for:  persistant dizziness or light-headedness   Complete by: As directed    Call MD for:  persistant nausea and vomiting   Complete by: As directed    Call MD for:  severe uncontrolled pain   Complete by: As directed    Call MD for:  temperature >100.4   Complete by: As directed    Diet - low sodium heart healthy   Complete by: As directed    Diet Carb Modified   Complete by: As directed    Discharge instructions   Complete by: As directed    It has been a pleasure taking care of you!  You were hospitalized due to bleeding from gastric varices, alcohol intoxication and altered mental status. You were treated surgically medically, and your symptoms improved to the point we think it is safe to let you go home and follow-up with your doctors. Is very important that you quit drinking alcohol. It is also very important that you take your medications as prescribed. Please follow-up with your primary care doctor and gastroenterologist in 1 to 2 weeks or sooner if needed. Please discuss about your Paroxetine and Venlafaxine with your prescriber.  Usually, those two medications are not taken together.    Take care,   Increase activity slowly   Complete by: As directed      Allergies as of 11/20/2020   No Known Allergies     Medication List    STOP taking these medications   naltrexone 50 MG tablet Commonly known as: DEPADE     TAKE these medications   allopurinol 100 MG tablet Commonly known as: ZYLOPRIM TAKE 1 TABLET BY MOUTH EVERY DAY   blood glucose meter kit and supplies Dispense based on patient and insurance preference. Test blood sugar once daily.  (FOR ICD-10 E10.9, E11.9).   D3-1000 25 MCG (1000 UT) capsule Generic drug: Cholecalciferol Take 1,000 Units by mouth daily.   Dexilant 60 MG capsule Generic drug: dexlansoprazole Take 1 capsule (60 mg total) by mouth daily.   ferrous sulfate 325 (65 FE) MG EC tablet Take 1 tablet (325 mg total) by mouth 2 (two) times daily.   folic acid 037 MCG tablet Commonly known as: FOLVITE Take 400 mcg by mouth daily.   furosemide 40 MG tablet Commonly known as: Lasix Take 0.5 tablets (20 mg total) by mouth daily. Take Daily for 5 Days, Then Take Daily Only As Needed for Edema  gabapentin 300 MG capsule Commonly known as: NEURONTIN Take 1 capsule (300 mg total) by mouth 3 (three) times daily.   lactulose (encephalopathy) 10 GM/15ML Soln Commonly known as: CHRONULAC Take 15-30cc 1-2 daily to Titrate for 3-4 soft bowel movements a day What changed:   how much to take  how to take this  when to take this   multivitamin with minerals Tabs tablet Take 1 tablet by mouth daily.   nadolol 40 MG tablet Commonly known as: Corgard Take 1.5 tablets (60 mg total) by mouth daily.   ondansetron 4 MG disintegrating tablet Commonly known as: ZOFRAN-ODT DISSOLVE 1 TABLET ON THE TONGUE EVERY 8 HOURS AS NEEDED FOR NAUSEA What changed: See the new instructions.   PARoxetine 20 MG tablet Commonly known as: PAXIL Take 1 tablet (20 mg total) by mouth daily.    potassium chloride SA 20 MEQ tablet Commonly known as: KLOR-CON Take 1 tablet (20 mEq total) by mouth daily as needed (Take when taking Lasix).   rifaximin 550 MG Tabs tablet Commonly known as: XIFAXAN Take 1 tablet (550 mg total) by mouth 2 (two) times daily.   sildenafil 100 MG tablet Commonly known as: VIAGRA TAKE 1/2 TABLET BY MOUTH EVERY DAY What changed:   when to take this  reasons to take this   sitaGLIPtin 50 MG tablet Commonly known as: Januvia Take 1 tablet (50 mg total) by mouth daily. What changed: how much to take   sucralfate 1 g tablet Commonly known as: CARAFATE TAKE 1 TABLET BY MOUTH BEFORE MEALS What changed: See the new instructions.   thiamine 100 MG tablet Take 1 tablet (100 mg total) by mouth daily.   traZODone 50 MG tablet Commonly known as: DESYREL TAKE 1 TABLET BY MOUTH AT BEDTIME AS NEEDED FOR INSOMNIA   venlafaxine 25 MG tablet Commonly known as: EFFEXOR Take 25 mg by mouth 2 (two) times daily.       Consultations:  Gastroenterology  Interventional radiology  Procedures/Studies: 2D Echo on 11/15/2020 1. Left ventricular ejection fraction, by estimation, is 60 to 65%. The  left ventricle has normal function. The left ventricle has no regional  wall motion abnormalities. Left ventricular diastolic parameters are  consistent with Grade I diastolic  dysfunction (impaired relaxation). Left ventricular filling pressures are  probably abnormally low.  2. Right ventricular systolic function is normal. The right ventricular  size is normal.  3. The mitral valve is normal in structure. No evidence of mitral valve  regurgitation. No evidence of mitral stenosis.  4. The aortic valve is normal in structure. Aortic valve regurgitation is  not visualized. No aortic stenosis is present.  5. The inferior vena cava is normal in size with greater than 50%  respiratory variability, suggesting right atrial pressure of 3 mmHg.   EGD on  11/15/2020 Normal esophagus. Large volume of clotted blood and red blood in the cardia, in the gastric fundus and in the proximal gastric body that could not be cleared due to the volume of active bleeding and large size of the clots. Active proximal gastric bleeding, suspected bleeding gastric varices. Normal duodenal bulb and second portion of the duodenum.  IR angiogram on 11/17/2020 1. Ultrasound and fluoroscopic guided central venous catheter  placement.  2. Ultrasound-guided right common femoral venous access.  3. Selective catheterization of gastro renal shunt.  4. Gastric variceal venogram.  5. Coil embolization of posterior branch of bifurcated gastro renal  shunt.  6. Retrograde sclerotherapy of gastric varices assisted with  coil  embolization.     DG Abd 1 View  Result Date: 11/17/2020 CLINICAL DATA:  NG placement. EXAM: ABDOMEN - 1 VIEW COMPARISON:  CT abdomen pelvis 11/15/2020. FINDINGS: NG tube tip in the proximal stomach. Small amount of gas in the stomach. Mildly distended small bowel loops. Gas in nondilated colon. Tips.  Multiple embolization coils in the epigastric region. IMPRESSION: NG tube in the proximal stomach. Mild small bowel dilatation. Possible ileus. Electronically Signed   By: Franchot Gallo M.D.   On: 11/17/2020 09:57   IR Angiogram Selective Each Additional Vessel  Result Date: 11/17/2020 CLINICAL DATA:  54 year old male with history of alcoholic cirrhosis and upper gastrointestinal hemorrhage status post balloon and coil assisted retrograde transvenous obliteration of gastric varices on 11/22/2019 followed by TIPS creation on 11/24/2019 for continued hemorrhage. The patient presents with acute large volume hematemesis and CT evidence of patent gastric varicosities.  EXAM: 1. Ultrasound and fluoroscopic guided central venous catheter placement. 2. Ultrasound-guided right common femoral venous access. 3. Selective catheterization of gastro renal shunt. 4. Gastric  variceal venogram. 5. Coil embolization of posterior branch of bifurcated gastro renal shunt. 6. Retrograde sclerotherapy of gastric varices assisted with coil embolization.  MEDICATIONS: As per anesthesia record.  ANESTHESIA/SEDATION: General - as administered by the Anesthesia department  CONTRAST:  220 mL Omnipaque 300, intravenous  FLUOROSCOPY TIME:  Fluoroscopy Time: 61 minutes 36 seconds (3,013 mGy).  COMPLICATIONS: None immediate.  PROCEDURE: Informed written consent was obtained from the patient's wife after a thorough discussion of the procedural risks, benefits and alternatives. All questions were addressed. Maximal Sterile Barrier Technique was utilized including caps, mask, sterile gowns, sterile gloves, sterile drape, hand hygiene and skin antiseptic. A timeout was performed prior to the initiation of the procedure.  The right neck was prepped and draped in standard fashion. Ultrasound evaluation demonstrated patent internal jugular vein which was compressible and free of thrombus. A small skin nick was made. 21 gauge micropuncture needle was inserted under direct ultrasound visualization into the internal jugular vein. A micro puncture kit wire was advanced to the level of the right atrium. The micropuncture sheath was introduced and inner dilator and wire removed. A 0.035 inch J-wire was placed to level of the right atrium, sero dilation was performed followed by placement of an 8 French triple-lumen, 20 cm catheter. The lumens flushed and aspirated with ease. The catheter tip was confirmed fluoroscopically to be located at the cavoatrial junction. The catheter was sutured in place and the lines were turned over to Anesthesia for venous access during the procedure.  The right groin was then prepped and draped in standard fashion. Ultrasound evaluation demonstrated a patent right common femoral vein. A small skin nick was made at the planned needle entry site. The right common femoral vein was  accessed with a 21 gauge micropuncture needle. A Rosen wire was directed to the inferior vena cava. Serial dilation was performed and a 10 French tips sheath was inserted. A 5 French C2 catheter was directed over the [facility] wire and retracted until gaining access to the left renal vein ostium. The Rosen wire was then reinserted and position in an inferior branch of the left renal vein. Limited left renal venogram demonstrated patency and brisk outflow into the inferior vena cava. A Kumpe the catheter was then placed through the sheath adjacent to the indwelling safety wire and directed into the gastro renal shunt. Venogram demonstrated patent gastric varices with outflow into the gastro renal shunt. Balloon occlusion  venogram via a 5 [place] Fogarty was then performed which demonstrated prominent nidus of gastric varices measuring approximately 4 cm by 1.6 cm with multiple gastric draining veins. There is no significant outflow or reflux into the left gastric, posterior gastric, or short gastric veins. Repeat venogram was performed in a left anterior oblique which demonstrated bifurcation of the gastro renal shunt with a smaller, posterior branch. The indwelling safety catheter was removed and a Progreat Omega microcatheter was advanced into the posterior branch of the spleno renal shunt. Venogram was performed confirming position just inferior to the previously placed coil pack. An assortment of Penumbra detachable of microcoils were then inserted into the superior aspect of this draining vein to prevent outflow of planned sclerosis.  Next, sclerosing of the large network of gastric varices was not pursued. Multiple Fogarty balloon catheters were attempted to occlude the gastro renal shunt which was unsuccessful due to spontaneous rupture of the balloons. Therefore, an 8 [place] Merci balloon occlusion guide catheter was positioned in the inferior aspect of the main channel of the gastro renal shunt with adequate  balloon occlusion confirmed by venogram which again demonstrated no significant outflow and adequate embolization of the bifurcated splenorenal shunt branch. A solution of lipiodol, Sotradecol, and room air (1:2:3 ratio) was prepared into a sclerosing foam. A Progreat Omega microcatheter was inserted through the indwelling occlusion balloon into the varicosities. Under fluoroscopic visualization, the sclerosing admixture was administered slowly. The sclerosing solution filled the largest nidus of gastric varix. There is suggestion of a small amount of sclerosing solution traveling to the fundal gastric veins, which was decided to be the stopping point of sclerosis. The catheter was flushed. Next, an assortment of Penmbra detachable microcoils were delivered through the indwelling microcatheter to embolize the gastro renal shunt outflow. The occlusion balloon was slowly deflated under direct fluoroscopic visualization without evidence of coil or contrast migration. Completion venogram was performed through the indwelling tips sheath in the left renal ostium which demonstrated brisk renal vein outflow but sluggish outflow from the gastro renal shunt, suggestive of adequate sclerosis and embolization.  The sheath was then removed and hemostasis was obtained with manual compression. The patient tolerated the procedure well was transferred back to the ICU intubated and sedated.  IMPRESSION: 1. Technically successful balloon and coil assisted retrograde transvenous obliteration of recanalized gastric varices in the setting of massive hematemesis with associated hemorrhagic shock. 2. Placement of right internal jugular triple-lumen central venous catheter (20 cm) with the catheter tip at the cavoatrial junction. The catheter is ready for immediate use.  PLAN: Continue close monitoring of hemodynamics status. Expect continued melena for several days. Interventional Radiology will continue to follow while inpatient, and  arrange for outpatient follow-up visit in 1 month.  Ruthann Cancer, MD  [facility]  [facility]   Electronically Signed   By: Ruthann Cancer MD   On: 11/16/2020 09:21   IR Venogram Renal Uni Left  Result Date: 11/16/2020 CLINICAL DATA:  54 year old male with history of alcoholic cirrhosis and upper gastrointestinal hemorrhage status post balloon and coil assisted retrograde transvenous obliteration of gastric varices on 11/22/2019 followed by TIPS creation on 11/24/2019 for continued hemorrhage. The patient presents with acute large volume hematemesis and CT evidence of patent gastric varicosities. EXAM: 1. Ultrasound and fluoroscopic guided central venous catheter placement. 2. Ultrasound-guided right common femoral venous access. 3. Selective catheterization of gastro renal shunt. 4. Gastric variceal venogram. 5. Coil embolization of posterior branch of bifurcated gastro  renal shunt. 6. Retrograde sclerotherapy of gastric varices assisted with coil embolization. MEDICATIONS: As per anesthesia record. ANESTHESIA/SEDATION: General - as administered by the Anesthesia department CONTRAST:  220 mL Omnipaque 300, intravenous FLUOROSCOPY TIME:  Fluoroscopy Time: 61 minutes 36 seconds (3,013 mGy). COMPLICATIONS: None immediate. PROCEDURE: Informed written consent was obtained from the patient's wife after a thorough discussion of the procedural risks, benefits and alternatives. All questions were addressed. Maximal Sterile Barrier Technique was utilized including caps, mask, sterile gowns, sterile gloves, sterile drape, hand hygiene and skin antiseptic. A timeout was performed prior to the initiation of the procedure. The right neck was prepped and draped in standard fashion. Ultrasound evaluation demonstrated patent internal jugular vein which was compressible and free of thrombus. A small skin nick was made. 21 gauge micropuncture needle was inserted under direct  ultrasound visualization into the internal jugular vein. A micro puncture kit wire was advanced to the level of the right atrium. The micropuncture sheath was introduced and inner dilator and wire removed. A 0.035 inch J-wire was placed to level of the right atrium, sero dilation was performed followed by placement of an 8 French triple-lumen, 20 cm catheter. The lumens flushed and aspirated with ease. The catheter tip was confirmed fluoroscopically to be located at the cavoatrial junction. The catheter was sutured in place and the lines were turned over to Anesthesia for venous access during the procedure. The right groin was then prepped and draped in standard fashion. Ultrasound evaluation demonstrated a patent right common femoral vein. A small skin nick was made at the planned needle entry site. The right common femoral vein was accessed with a 21 gauge micropuncture needle. A Rosen wire was directed to the inferior vena cava. Serial dilation was performed and a 10 French tips sheath was inserted. A 5 French C2 catheter was directed over the [facility] wire and retracted until gaining access to the left renal vein ostium. The Rosen wire was then reinserted and position in an inferior branch of the left renal vein. Limited left renal venogram demonstrated patency and brisk outflow into the inferior vena cava. A Kumpe the catheter was then placed through the sheath adjacent to the indwelling safety wire and directed into the gastro renal shunt. Venogram demonstrated patent gastric varices with outflow into the gastro renal shunt. Balloon occlusion venogram via a 5 [place] Fogarty was then performed which demonstrated prominent nidus of gastric varices measuring approximately 4 cm by 1.6 cm with multiple gastric draining veins. There is no significant outflow or reflux into the left gastric, posterior gastric, or short gastric veins. Repeat venogram was performed in a left anterior oblique which demonstrated bifurcation  of the gastro renal shunt with a smaller, posterior branch. The indwelling safety catheter was removed and a Progreat Omega microcatheter was advanced into the posterior branch of the spleno renal shunt. Venogram was performed confirming position just inferior to the previously placed coil pack. An assortment of Penumbra detachable of microcoils were then inserted into the superior aspect of this draining vein to prevent outflow of planned sclerosis. Next, sclerosing of the large network of gastric varices was not pursued. Multiple Fogarty balloon catheters were attempted to occlude the gastro renal shunt which was unsuccessful due to spontaneous rupture of the balloons. Therefore, an 8 [place] Merci balloon occlusion guide catheter was positioned in the inferior aspect of the main channel of the gastro renal shunt with adequate balloon occlusion confirmed by venogram which again demonstrated no significant outflow and adequate embolization of  the bifurcated splenorenal shunt branch. A solution of lipiodol, Sotradecol, and room air (1:2:3 ratio) was prepared into a sclerosing foam. A Progreat Omega microcatheter was inserted through the indwelling occlusion balloon into the varicosities. Under fluoroscopic visualization, the sclerosing admixture was administered slowly. The sclerosing solution filled the largest nidus of gastric varix. There is suggestion of a small amount of sclerosing solution traveling to the fundal gastric veins, which was decided to be the stopping point of sclerosis. The catheter was flushed. Next, an assortment of Penmbra detachable microcoils were delivered through the indwelling microcatheter to embolize the gastro renal shunt outflow. The occlusion balloon was slowly deflated under direct fluoroscopic visualization without evidence of coil or contrast migration. Completion venogram was performed through the indwelling tips sheath in the left renal ostium which demonstrated brisk renal vein  outflow but sluggish outflow from the gastro renal shunt, suggestive of adequate sclerosis and embolization. The sheath was then removed and hemostasis was obtained with manual compression. The patient tolerated the procedure well was transferred back to the ICU intubated and sedated. IMPRESSION: 1. Technically successful balloon and coil assisted retrograde transvenous obliteration of recanalized gastric varices in the setting of massive hematemesis with associated hemorrhagic shock. 2. Placement of right internal jugular triple-lumen central venous catheter (20 cm) with the catheter tip at the cavoatrial junction. The catheter is ready for immediate use. PLAN: Continue close monitoring of hemodynamics status. Expect continued melena for several days. Interventional Radiology will continue to follow while inpatient, and arrange for outpatient follow-up visit in 1 month. Ruthann Cancer, MD [facility] Radiology Electronically Signed   By: Ruthann Cancer MD   On: 11/16/2020 09:21   IR Fluoro Guide CV Line Right  Result Date: 11/16/2020 CLINICAL DATA:  54 year old male with history of alcoholic cirrhosis and upper gastrointestinal hemorrhage status post balloon and coil assisted retrograde transvenous obliteration of gastric varices on 11/22/2019 followed by TIPS creation on 11/24/2019 for continued hemorrhage. The patient presents with acute large volume hematemesis and CT evidence of patent gastric varicosities. EXAM: 1. Ultrasound and fluoroscopic guided central venous catheter placement. 2. Ultrasound-guided right common femoral venous access. 3. Selective catheterization of gastro renal shunt. 4. Gastric variceal venogram. 5. Coil embolization of posterior branch of bifurcated gastro renal shunt. 6. Retrograde sclerotherapy of gastric varices assisted with coil embolization. MEDICATIONS: As per anesthesia record. ANESTHESIA/SEDATION: General - as administered by  the Anesthesia department CONTRAST:  220 mL Omnipaque 300, intravenous FLUOROSCOPY TIME:  Fluoroscopy Time: 61 minutes 36 seconds (3,013 mGy). COMPLICATIONS: None immediate. PROCEDURE: Informed written consent was obtained from the patient's wife after a thorough discussion of the procedural risks, benefits and alternatives. All questions were addressed. Maximal Sterile Barrier Technique was utilized including caps, mask, sterile gowns, sterile gloves, sterile drape, hand hygiene and skin antiseptic. A timeout was performed prior to the initiation of the procedure. The right neck was prepped and draped in standard fashion. Ultrasound evaluation demonstrated patent internal jugular vein which was compressible and free of thrombus. A small skin nick was made. 21 gauge micropuncture needle was inserted under direct ultrasound visualization into the internal jugular vein. A micro puncture kit wire was advanced to the level of the right atrium. The micropuncture sheath was introduced and inner dilator and wire removed. A 0.035 inch J-wire was placed to level of the right atrium, sero dilation was performed followed by placement of an 8 French triple-lumen, 20 cm catheter. The lumens flushed and aspirated with ease. The catheter  tip was confirmed fluoroscopically to be located at the cavoatrial junction. The catheter was sutured in place and the lines were turned over to Anesthesia for venous access during the procedure. The right groin was then prepped and draped in standard fashion. Ultrasound evaluation demonstrated a patent right common femoral vein. A small skin nick was made at the planned needle entry site. The right common femoral vein was accessed with a 21 gauge micropuncture needle. A Rosen wire was directed to the inferior vena cava. Serial dilation was performed and a 10 French tips sheath was inserted. A 5 French C2 catheter was directed over the [facility] wire and retracted until gaining access to the left  renal vein ostium. The Rosen wire was then reinserted and position in an inferior branch of the left renal vein. Limited left renal venogram demonstrated patency and brisk outflow into the inferior vena cava. A Kumpe the catheter was then placed through the sheath adjacent to the indwelling safety wire and directed into the gastro renal shunt. Venogram demonstrated patent gastric varices with outflow into the gastro renal shunt. Balloon occlusion venogram via a 5 [place] Fogarty was then performed which demonstrated prominent nidus of gastric varices measuring approximately 4 cm by 1.6 cm with multiple gastric draining veins. There is no significant outflow or reflux into the left gastric, posterior gastric, or short gastric veins. Repeat venogram was performed in a left anterior oblique which demonstrated bifurcation of the gastro renal shunt with a smaller, posterior branch. The indwelling safety catheter was removed and a Progreat Omega microcatheter was advanced into the posterior branch of the spleno renal shunt. Venogram was performed confirming position just inferior to the previously placed coil pack. An assortment of Penumbra detachable of microcoils were then inserted into the superior aspect of this draining vein to prevent outflow of planned sclerosis. Next, sclerosing of the large network of gastric varices was not pursued. Multiple Fogarty balloon catheters were attempted to occlude the gastro renal shunt which was unsuccessful due to spontaneous rupture of the balloons. Therefore, an 8 [place] Merci balloon occlusion guide catheter was positioned in the inferior aspect of the main channel of the gastro renal shunt with adequate balloon occlusion confirmed by venogram which again demonstrated no significant outflow and adequate embolization of the bifurcated splenorenal shunt branch. A solution of lipiodol, Sotradecol, and room air (1:2:3 ratio) was prepared into a sclerosing foam. A Progreat Omega  microcatheter was inserted through the indwelling occlusion balloon into the varicosities. Under fluoroscopic visualization, the sclerosing admixture was administered slowly. The sclerosing solution filled the largest nidus of gastric varix. There is suggestion of a small amount of sclerosing solution traveling to the fundal gastric veins, which was decided to be the stopping point of sclerosis. The catheter was flushed. Next, an assortment of Penmbra detachable microcoils were delivered through the indwelling microcatheter to embolize the gastro renal shunt outflow. The occlusion balloon was slowly deflated under direct fluoroscopic visualization without evidence of coil or contrast migration. Completion venogram was performed through the indwelling tips sheath in the left renal ostium which demonstrated brisk renal vein outflow but sluggish outflow from the gastro renal shunt, suggestive of adequate sclerosis and embolization. The sheath was then removed and hemostasis was obtained with manual compression. The patient tolerated the procedure well was transferred back to the ICU intubated and sedated. IMPRESSION: 1. Technically successful balloon and coil assisted retrograde transvenous obliteration of recanalized gastric varices in the setting of massive hematemesis with associated hemorrhagic shock. 2. Placement  of right internal jugular triple-lumen central venous catheter (20 cm) with the catheter tip at the cavoatrial junction. The catheter is ready for immediate use. PLAN: Continue close monitoring of hemodynamics status. Expect continued melena for several days. Interventional Radiology will continue to follow while inpatient, and arrange for outpatient follow-up visit in 1 month. Ruthann Cancer, MD [facility] Inter[facility] Radiology Special[facility] Radiology Electronically Signed   By: Ruthann Cancer MD   On: 11/16/2020 09:21   IR US Guide Vasc Access Right  Result Date: 11/16/2020 CLINICAL DATA:   54 year old male with history of alcoholic cirrhosis and upper gastrointestinal hemorrhage status post balloon and coil assisted retrograde transvenous obliteration of gastric varices on 11/22/2019 followed by TIPS creation on 11/24/2019 for continued hemorrhage. The patient presents with acute large volume hematemesis and CT evidence of patent gastric varicosities. EXAM: 1. Ultrasound and fluoroscopic guided central venous catheter placement. 2. Ultrasound-guided right common femoral venous access. 3. Selective catheterization of gastro renal shunt. 4. Gastric variceal venogram. 5. Coil embolization of posterior branch of bifurcated gastro renal shunt. 6. Retrograde sclerotherapy of gastric varices assisted with coil embolization. MEDICATIONS: As per anesthesia record. ANESTHESIA/SEDATION: General - as administered by the Anesthesia department CONTRAST:  220 mL Omnipaque 300, intravenous FLUOROSCOPY TIME:  Fluoroscopy Time: 61 minutes 36 seconds (3,013 mGy). COMPLICATIONS: None immediate. PROCEDURE: Informed written consent was obtained from the patient's wife after a thorough discussion of the procedural risks, benefits and alternatives. All questions were addressed. Maximal Sterile Barrier Technique was utilized including caps, mask, sterile gowns, sterile gloves, sterile drape, hand hygiene and skin antiseptic. A timeout was performed prior to the initiation of the procedure. The right neck was prepped and draped in standard fashion. Ultrasound evaluation demonstrated patent internal jugular vein which was compressible and free of thrombus. A small skin nick was made. 21 gauge micropuncture needle was inserted under direct ultrasound visualization into the internal jugular vein. A micro puncture kit wire was advanced to the level of the right atrium. The micropuncture sheath was introduced and inner dilator and wire removed. A 0.035 inch J-wire was placed to level of the right atrium, sero dilation was  performed followed by placement of an 8 French triple-lumen, 20 cm catheter. The lumens flushed and aspirated with ease. The catheter tip was confirmed fluoroscopically to be located at the cavoatrial junction. The catheter was sutured in place and the lines were turned over to Anesthesia for venous access during the procedure. The right groin was then prepped and draped in standard fashion. Ultrasound evaluation demonstrated a patent right common femoral vein. A small skin nick was made at the planned needle entry site. The right common femoral vein was accessed with a 21 gauge micropuncture needle. A Rosen wire was directed to the inferior vena cava. Serial dilation was performed and a 10 French tips sheath was inserted. A 5 French C2 catheter was directed over the [facility] wire and retracted until gaining access to the left renal vein ostium. The Rosen wire was then reinserted and position in an inferior branch of the left renal vein. Limited left renal venogram demonstrated patency and brisk outflow into the inferior vena cava. A Kumpe the catheter was then placed through the sheath adjacent to the indwelling safety wire and directed into the gastro renal shunt. Venogram demonstrated patent gastric varices with outflow into the gastro renal shunt. Balloon occlusion venogram via a 5 [place] Fogarty was then performed which demonstrated prominent nidus of gastric varices measuring approximately 4 cm by  1.6 cm with multiple gastric draining veins. There is no significant outflow or reflux into the left gastric, posterior gastric, or short gastric veins. Repeat venogram was performed in a left anterior oblique which demonstrated bifurcation of the gastro renal shunt with a smaller, posterior branch. The indwelling safety catheter was removed and a Progreat Omega microcatheter was advanced into the posterior branch of the spleno renal shunt. Venogram was performed confirming position just inferior to the previously  placed coil pack. An assortment of Penumbra detachable of microcoils were then inserted into the superior aspect of this draining vein to prevent outflow of planned sclerosis. Next, sclerosing of the large network of gastric varices was not pursued. Multiple Fogarty balloon catheters were attempted to occlude the gastro renal shunt which was unsuccessful due to spontaneous rupture of the balloons. Therefore, an 8 [place] Merci balloon occlusion guide catheter was positioned in the inferior aspect of the main channel of the gastro renal shunt with adequate balloon occlusion confirmed by venogram which again demonstrated no significant outflow and adequate embolization of the bifurcated splenorenal shunt branch. A solution of lipiodol, Sotradecol, and room air (1:2:3 ratio) was prepared into a sclerosing foam. A Progreat Omega microcatheter was inserted through the indwelling occlusion balloon into the varicosities. Under fluoroscopic visualization, the sclerosing admixture was administered slowly. The sclerosing solution filled the largest nidus of gastric varix. There is suggestion of a small amount of sclerosing solution traveling to the fundal gastric veins, which was decided to be the stopping point of sclerosis. The catheter was flushed. Next, an assortment of Penmbra detachable microcoils were delivered through the indwelling microcatheter to embolize the gastro renal shunt outflow. The occlusion balloon was slowly deflated under direct fluoroscopic visualization without evidence of coil or contrast migration. Completion venogram was performed through the indwelling tips sheath in the left renal ostium which demonstrated brisk renal vein outflow but sluggish outflow from the gastro renal shunt, suggestive of adequate sclerosis and embolization. The sheath was then removed and hemostasis was obtained with manual compression. The patient tolerated the procedure well was transferred back to the ICU intubated and  sedated. IMPRESSION: 1. Technically successful balloon and coil assisted retrograde transvenous obliteration of recanalized gastric varices in the setting of massive hematemesis with associated hemorrhagic shock. 2. Placement of right internal jugular triple-lumen central venous catheter (20 cm) with the catheter tip at the cavoatrial junction. The catheter is ready for immediate use. PLAN: Continue close monitoring of hemodynamics status. Expect continued melena for several days. Interventional Radiology will continue to follow while inpatient, and arrange for outpatient follow-up visit in 1 month. Ruthann Cancer, MD Vascular and Interventional Radiology Specialists [facility] Radiology Electronically Signed   By: Ruthann Cancer MD   On: 11/16/2020 09:21   IR US Guide Vasc Access Right  Result Date: 11/16/2020 CLINICAL DATA:  54 year old male with history of alcoholic cirrhosis and upper gastrointestinal hemorrhage status post balloon and coil assisted retrograde transvenous obliteration of gastric varices on 11/22/2019 followed by TIPS creation on 11/24/2019 for continued hemorrhage. The patient presents with acute large volume hematemesis and CT evidence of patent gastric varicosities. EXAM: 1. Ultrasound and fluoroscopic guided central venous catheter placement. 2. Ultrasound-guided right common femoral venous access. 3. Selective catheterization of gastro renal shunt. 4. Gastric variceal venogram. 5. Coil embolization of posterior branch of bifurcated gastro renal shunt. 6. Retrograde sclerotherapy of gastric varices assisted with coil embolization. MEDICATIONS: As per anesthesia record. ANESTHESIA/SEDATION: General - as administered by the Anesthesia department CONTRAST:  220  mL Omnipaque 300, intravenous FLUOROSCOPY TIME:  Fluoroscopy Time: 61 minutes 36 seconds (3,013 mGy). COMPLICATIONS: None immediate. PROCEDURE: Informed written consent was obtained from the patient's wife after a thorough discussion of  the procedural risks, benefits and alternatives. All questions were addressed. Maximal Sterile Barrier Technique was utilized including caps, mask, sterile gowns, sterile gloves, sterile drape, hand hygiene and skin antiseptic. A timeout was performed prior to the initiation of the procedure. The right neck was prepped and draped in standard fashion. Ultrasound evaluation demonstrated patent internal jugular vein which was compressible and free of thrombus. A small skin nick was made. 21 gauge micropuncture needle was inserted under direct ultrasound visualization into the internal jugular vein. A micro puncture kit wire was advanced to the level of the right atrium. The micropuncture sheath was introduced and inner dilator and wire removed. A 0.035 inch J-wire was placed to level of the right atrium, sero dilation was performed followed by placement of an 8 French triple-lumen, 20 cm catheter. The lumens flushed and aspirated with ease. The catheter tip was confirmed fluoroscopically to be located at the cavoatrial junction. The catheter was sutured in place and the lines were turned over to Anesthesia for venous access during the procedure. The right groin was then prepped and draped in standard fashion. Ultrasound evaluation demonstrated a patent right common femoral vein. A small skin nick was made at the planned needle entry site. The right common femoral vein was accessed with a 21 gauge micropuncture needle. A Rosen wire was directed to the inferior vena cava. Serial dilation was performed and a 10 French tips sheath was inserted. A 5 French C2 catheter was directed over the [facility] wire and retracted until gaining access to the left renal vein ostium. The Rosen wire was then reinserted and position in an inferior branch of the left renal vein. Limited left renal venogram demonstrated patency and brisk outflow into the inferior vena cava. A Kumpe the catheter was then placed through the sheath adjacent to the  indwelling safety wire and directed into the gastro renal shunt. Venogram demonstrated patent gastric varices with outflow into the gastro renal shunt. Balloon occlusion venogram via a 5 [place] Fogarty was then performed which demonstrated prominent nidus of gastric varices measuring approximately 4 cm by 1.6 cm with multiple gastric draining veins. There is no significant outflow or reflux into the left gastric, posterior gastric, or short gastric veins. Repeat venogram was performed in a left anterior oblique which demonstrated bifurcation of the gastro renal shunt with a smaller, posterior branch. The indwelling safety catheter was removed and a Progreat Omega microcatheter was advanced into the posterior branch of the spleno renal shunt. Venogram was performed confirming position just inferior to the previously placed coil pack. An assortment of Penumbra detachable of microcoils were then inserted into the superior aspect of this draining vein to prevent outflow of planned sclerosis. Next, sclerosing of the large network of gastric varices was not pursued. Multiple Fogarty balloon catheters were attempted to occlude the gastro renal shunt which was unsuccessful due to spontaneous rupture of the balloons. Therefore, an 8 [place] Merci balloon occlusion guide catheter was positioned in the inferior aspect of the main channel of the gastro renal shunt with adequate balloon occlusion confirmed by venogram which again demonstrated no significant outflow and adequate embolization of the bifurcated splenorenal shunt branch. A solution of lipiodol, Sotradecol, and room air (1:2:3 ratio) was prepared into a sclerosing foam. A Progreat Omega microcatheter was inserted through the  indwelling occlusion balloon into the varicosities. Under fluoroscopic visualization, the sclerosing admixture was administered slowly. The sclerosing solution filled the largest nidus of gastric varix. There is suggestion of a small amount of  sclerosing solution traveling to the fundal gastric veins, which was decided to be the stopping point of sclerosis. The catheter was flushed. Next, an assortment of Penmbra detachable microcoils were delivered through the indwelling microcatheter to embolize the gastro renal shunt outflow. The occlusion balloon was slowly deflated under direct fluoroscopic visualization without evidence of coil or contrast migration. Completion venogram was performed through the indwelling tips sheath in the left renal ostium which demonstrated brisk renal vein outflow but sluggish outflow from the gastro renal shunt, suggestive of adequate sclerosis and embolization. The sheath was then removed and hemostasis was obtained with manual compression. The patient tolerated the procedure well was transferred back to the ICU intubated and sedated. IMPRESSION: 1. Technically successful balloon and coil assisted retrograde transvenous obliteration of recanalized gastric varices in the setting of massive hematemesis with associated hemorrhagic shock. 2. Placement of right internal jugular triple-lumen central venous catheter (20 cm) with the catheter tip at the cavoatrial junction. The catheter is ready for immediate use. PLAN: Continue close monitoring of hemodynamics status. Expect continued melena for several days. Interventional Radiology will continue to follow while inpatient, and arrange for outpatient follow-up visit in 1 month. Ruthann Cancer, MD Vascular and Interventional Radiology Specialists [facility] Radiology Electronically Signed   By: Ruthann Cancer MD   On: 11/16/2020 09:21   DG Chest Port 1 View  Result Date: 11/16/2020 CLINICAL DATA:  Respiratory failure. EXAM: PORTABLE CHEST 1 VIEW COMPARISON:  11/15/2020 FINDINGS: Endotracheal tube is 4.8 cm above the carina. Right jugular central line in the SVC region. Negative for pneumothorax. Improved aeration in the left lung but residual opacity in the retrocardiac space and  along the left hemidiaphragm. Findings are compatible with known volume loss. Heart size is within normal limits. Again noted are multiple embolization coils in the upper abdomen. IMPRESSION: 1. Improving aeration at the left lung base. Residual retrocardiac opacities compatible with known volume loss. 2. Interval placement of a right jugular central line. Catheter tip is in the SVC region. Negative for pneumothorax. 3. Endotracheal tube is appropriately positioned. Electronically Signed   By: Markus Daft M.D.   On: 11/16/2020 08:47   DG CHEST PORT 1 VIEW  Result Date: 11/15/2020 CLINICAL DATA:  Status post intubation. EXAM: PORTABLE CHEST 1 VIEW COMPARISON:  Chest x-ray 11/15/2020 FINDINGS: The endotracheal tube is 5 cm above the carina. Interval significant change in the mediastinal contour since earlier film. Findings worrisome for hemorrhage/hematoma. There is also a new large left pleural effusion. Findings worrisome for hemothorax. Would be concerned about variceal rupture/bleeding. IMPRESSION: 1. Endotracheal tube in good position. 2. New large left pleural effusion and significant change in mediastinal contour worrisome for hemorrhage/hematoma. Suspect variceal rupture/bleeding. These results were called by telephone at the time of interpretation on 11/15/2020 at 12:12 pm to the patient's ICU nurse, April, who verbally acknowledged these results. She was going to notify Dr. Ruthann Cancer. Electronically Signed   By: Marijo Sanes M.D.   On: 11/15/2020 12:17   DG Chest Port 1 View  Result Date: 11/15/2020 CLINICAL DATA:  Hematemesis since last evening. EXAM: PORTABLE CHEST 1 VIEW COMPARISON:  Chest x-ray 08/05/2020 FINDINGS: The cardiac silhouette, mediastinal hilar contours are within normal limits. The lungs are clear. No pleural effusions. No pulmonary lesions. No pneumothorax. The bony thorax is  intact. IMPRESSION: No acute cardiopulmonary findings. Electronically Signed   By: Marijo Sanes M.D.   On:  11/15/2020 06:46   CT ANGIO CHEST AORTA W/CM &/OR WO/CM  Result Date: 11/15/2020 CLINICAL DATA:  54 year old with recent intubation and hematemesis. Concern for hemorrhage in the chest. EXAM: CT ANGIOGRAPHY CHEST WITH CONTRAST TECHNIQUE: Multidetector CT imaging of the chest was performed using the standard protocol during bolus administration of intravenous contrast. Multiplanar CT image reconstructions and MIPs were obtained to evaluate the vascular anatomy. CONTRAST:  68m OMNIPAQUE IOHEXOL 350 MG/ML SOLN COMPARISON:  Chest radiograph 11/15/2020 and CT a abdomen 11/15/2020 FINDINGS: Cardiovascular: Normal caliber of the thoracic aorta with mild atherosclerotic disease. No evidence for aortic dissection. Suboptimal evaluation of the pulmonary arteries but no evidence for a large central pulmonary embolism. Heart size is normal. No significant pericardial fluid. Mediastinum/Nodes: Mediastinal shift towards the left related to volume loss in the left lung. There is no evidence for a mediastinal hematoma. No significant chest lymphadenopathy. No axillary lymph node enlargement. Lungs/Pleura: There is no significant pericardial fluid. No evidence for a hemothorax. Complete collapse of the left lower lobe which is new from the recent abdominal CT. Few air bronchograms in the left lower lobe. Findings could be related to aspiration or mucous plugging with poorly defined filling defect in the proximal left lower lobe bronchus. Few patchy densities along the posterior aspect the left upper lobe. 2 mm nodule in the right upper lobe on sequence 6, image 64. Some focal thickening or atelectasis along the right major fissure on image 63. Nodular density in the right lung apex on sequence 6 image 34 measures roughly 4 mm. No significant airspace disease, consolidation or volume loss in the right lung. Negative for pneumothorax. Endotracheal tube is appropriately positioned above the carina. Upper Abdomen: Images of the  upper abdomen again demonstrate a TIPS stent and embolization coils in the upper abdomen. Patient has perihepatic ascites. Stomach appears to be distended with high-density material that may represent blood products based on history. Musculoskeletal: No acute bone abnormality. Review of the MIP images confirms the above findings. IMPRESSION: 1. Complete collapse of the left lower lobe. This lobar collapse represents the opacities and changes seen on the recent chest radiograph. Left lower lobe collapse may be related to mucous plugging or aspiration. 2. No evidence for a hemothorax or mediastinal hematoma. No significant pleural fluid. 3. Limited evaluation for pulmonary embolism due to technical issues. However, there is no evidence for a large central pulmonary embolism. No acute abnormality of the thoracic aorta. 4.  Aortic Atherosclerosis (ICD10-I70.0). Electronically Signed   By: AMarkus DaftM.D.   On: 11/15/2020 14:50   ECHOCARDIOGRAM COMPLETE  Result Date: 11/15/2020    ECHOCARDIOGRAM REPORT   Patient Name:   Bradley FETTERMANDate of Exam: 11/15/2020 Medical Rec #:  [id number]      Height:       75.0 in Accession #:    [id number]     Weight:       263.3 lb Date of Birth:  1Aug 09, 1967     BSA:          2.466 m Patient Age:    89years        BP:           152/88 mmHg Patient Gender: M               HR:  110 bpm. Exam Location:  Inpatient Procedure: 2D Echo, Cardiac Doppler, Color Doppler and Intracardiac            Opacification Agent STAT ECHO Indications:    Pericardial effusion [[id number]]  History:        Patient has prior history of Echocardiogram examinations, most                 recent 06/02/2018. Arrythmias:PVC, Signs/Symptoms:Shortness of                 Breath; Risk Factors:Current Smoker, Hypertension and Diabetes.                 GERD. Palpitations.  Sonographer:    Vickie Epley RDCS Referring Phys: [id number] [place] MARSHALL  Sonographer Comments: Echo performed with patient supine and on  artificial respirator. IMPRESSIONS  1. Left ventricular ejection fraction, by estimation, is 60 to 65%. The left ventricle has normal function. The left ventricle has no regional wall motion abnormalities. Left ventricular diastolic parameters are consistent with Grade I diastolic dysfunction (impaired relaxation). Left ventricular filling pressures are probably abnormally low.  2. Right ventricular systolic function is normal. The right ventricular size is normal.  3. The mitral valve is normal in structure. No evidence of mitral valve regurgitation. No evidence of mitral stenosis.  4. The aortic valve is normal in structure. Aortic valve regurgitation is not visualized. No aortic stenosis is present.  5. The inferior vena cava is normal in size with greater than 50% respiratory variability, suggesting right atrial pressure of 3 mmHg. Conclusion(s)/Recommendation(s): The inferior vena cava is remarkably small for a patient on positive pressure ventilation. Together with mitral inflow pattern of abnormal relaxation (with normal tissue Doppler velocities) and hyperdynamic LV contraction, this raises concern for hypovolemia. FINDINGS  Left Ventricle: Left ventricular ejection fraction, by estimation, is 60 to 65%. The left ventricle has normal function. The left ventricle has no regional wall motion abnormalities. Definity contrast agent was given IV to delineate the left ventricular  endocardial borders. The left ventricular internal cavity size was normal in size. There is no left ventricular hypertrophy. Left ventricular diastolic parameters are consistent with Grade I diastolic dysfunction (impaired relaxation). Left ventricular filling pressures are probably abnormally low. Right Ventricle: The right ventricular size is normal. No increase in right ventricular wall thickness. Right ventricular systolic function is normal. Left Atrium: Left atrial size was normal in size. Right Atrium: Right atrial size was normal  in size. Pericardium: There is no evidence of pericardial effusion. Mitral Valve: The mitral valve is normal in structure. No evidence of mitral valve regurgitation. No evidence of mitral valve stenosis. Tricuspid Valve: The tricuspid valve is normal in structure. Tricuspid valve regurgitation is not demonstrated. No evidence of tricuspid stenosis. Aortic Valve: The aortic valve is normal in structure. Aortic valve regurgitation is not visualized. No aortic stenosis is present. Pulmonic Valve: The pulmonic valve was normal in structure. Pulmonic valve regurgitation is not visualized. No evidence of pulmonic stenosis. Aorta: The aortic root is normal in size and structure. Venous: The inferior vena cava is normal in size with greater than 50% respiratory variability, suggesting right atrial pressure of 3 mmHg. IAS/Shunts: No atrial level shunt detected by color flow Doppler.  LEFT VENTRICLE PLAX 2D LVIDd:         5.00 cm      Diastology LVIDs:         3.30 cm      LV e' medial:  8.24 cm/s LV PW:         0.90 cm      LV E/e' medial:  7.6 LV IVS:        0.90 cm      LV e' lateral:   12.40 cm/s LVOT diam:     2.50 cm      LV E/e' lateral: 5.1 LV SV:         93 LV SV Index:   38 LVOT Area:     4.91 cm  LV Volumes (MOD) LV vol d, MOD A2C: 126.0 ml LV vol d, MOD A4C: 126.0 ml LV vol s, MOD A2C: 30.5 ml LV vol s, MOD A4C: 30.8 ml LV SV MOD A2C:     95.5 ml LV SV MOD A4C:     126.0 ml LV SV MOD BP:      98.9 ml RIGHT VENTRICLE RV S prime:     16.20 cm/s TAPSE (M-mode): 2.5 cm LEFT ATRIUM             Index       RIGHT ATRIUM           Index LA diam:        3.70 cm 1.50 cm/m  RA Area:     14.80 cm LA Vol (A2C):   41.9 ml 16.99 ml/m RA Volume:   36.60 ml  14.84 ml/m LA Vol (A4C):   40.4 ml 16.38 ml/m LA Biplane Vol: 43.1 ml 17.47 ml/m  AORTIC VALVE LVOT Vmax:   123.00 cm/s LVOT Vmean:  91.500 cm/s LVOT VTI:    0.190 m  AORTA Ao Root diam: 3.70 cm MITRAL VALVE MV Area (PHT): 4.63 cm     SHUNTS MV Decel Time: 164 msec      Systemic VTI:  0.19 m MV E velocity: 63.00 cm/s   Systemic Diam: 2.50 cm MV A velocity: 103.00 cm/s MV E/A ratio:  0.61 Mihai Croitoru MD Electronically signed by Sanda Klein MD Signature Date/Time: 11/15/2020/1:53:18 PM    Final    US ABDOMINAL PELVIC ART/VENT FLOW DOPPLER  Addendum Date: 10/24/2020   ADDENDUM REPORT: 10/24/2020 15:08 ADDENDUM: Small volume ascites and signs of liver disease/cirrhosis as before. Electronically Signed   By: Zetta Bills M.D.   On: 10/24/2020 15:08   Result Date: 10/24/2020 CLINICAL DATA:  Tips, bleeding esophageal varices follow-up evaluation. EXAM: DUPLEX ULTRASOUND OF LIVER AND TIPS SHUNT TECHNIQUE: Color and duplex Doppler ultrasound was performed to evaluate the hepatic in-flow and out-flow vessels. COMPARISON:  Comparison study from August 05, 2020 FINDINGS: Portal Vein Velocities Main:  32 cm/sec Right:  123 cm/sec Left:  18 cm/sec TIPS Stent Velocities Proximal:  123 cm/sec Mid:  163 Distal:  117 cm/sec IVC: IVC is patent. Hepatic Vein Velocities Right:  117 cm/sec Mid:  14 cm/sec Left:  13 cm/sec Splenic Vein: Patent, 17 centimeters/second. Superior Mesenteric Vein: Not visualized Hepatic Artery: 197 cm per second Ascities: Small amount of ascites. Varices: Not seen Other findings: None IMPRESSION: 1. Tips is patent with antegrade flow. Flow velocities are similar to the prior exam. 2. Portal vein is patent. 3. Hepatofugal flow in the LEFT portal vein. 4. Elevated velocities in the hepatic artery, not as elevated as on the previous ultrasound evaluation may be in part artifactual. The waveforms are normal. Electronically Signed: By: Zetta Bills M.D. On: 10/24/2020 14:56   US LIVER DOPPLER  Result Date: 11/16/2020 CLINICAL DATA:  54 year old with hematemesis and suspect recurrent gastric variceal  bleeding. Patient has had a BRTO procedure and TIPS procedure. Recent repeat BRTO procedure. Evaluate patency of the TIPS stent. EXAM: DUPLEX ULTRASOUND OF LIVER  AND TIPS SHUNT TECHNIQUE: Color and duplex Doppler ultrasound was performed to evaluate the hepatic in-flow and out-flow vessels. COMPARISON:  Liver duplex 10/24/2020 FINDINGS: Portal Vein Velocities Main:  48 cm/sec Right:  16 cm/sec Left:  19 cm/sec TIPS Stent Velocities Proximal-portal: 18 cm/sec Mid:  48 Distal-hepatic: 20 cm/sec IVC: Patent with normal phasicity. Hepatic Vein Velocities Right:  22 cm/sec Mid:  23 cm/sec Left:  33 cm/sec Splenic Vein: 27 cm/sec Superior Mesenteric Vein: 13 cm/sec Hepatic Artery: 93 cm/sec Ascities: Present Varices: Not visualized Hepatofugal flow in the right portal vein. Difficult to evaluate the flow direction in the left portal vein. TIPS stent is patent but the velocities have decreased compared to the previous examination. IMPRESSION: 1. TIPS stent is patent.  Main portal veins are patent. 2. Perihepatic ascites. Electronically Signed   By: Markus Daft M.D.   On: 11/16/2020 08:41   IR EMBO ART  VEN HEMORR LYMPH EXTRAV  INC GUIDE ROADMAPPING  Result Date: 11/15/2020 CLINICAL DATA:  54 year old male with history of alcoholic cirrhosis and upper gastrointestinal hemorrhage status post balloon and coil assisted retrograde transvenous obliteration of gastric varices on 11/22/2019 followed by TIPS creation on 11/24/2019 for continued hemorrhage. The patient presents with acute large volume hematemesis and CT evidence of patent gastric varicosities.  EXAM: 1. Ultrasound and fluoroscopic guided central venous catheter placement. 2. Ultrasound-guided right common femoral venous access. 3. Selective catheterization of gastro renal shunt. 4. Gastric variceal venogram. 5. Coil embolization of posterior branch of bifurcated gastro renal shunt. 6. Retrograde sclerotherapy of gastric varices assisted with coil embolization.  MEDICATIONS: As per anesthesia record.  ANESTHESIA/SEDATION: General - as administered by the Anesthesia department  CONTRAST:  220 mL Omnipaque 300,  intravenous  FLUOROSCOPY TIME:  Fluoroscopy Time: 61 minutes 36 seconds (3,013 mGy).  COMPLICATIONS: None immediate.  PROCEDURE: Informed written consent was obtained from the patient's wife after a thorough discussion of the procedural risks, benefits and alternatives. All questions were addressed. Maximal Sterile Barrier Technique was utilized including caps, mask, sterile gowns, sterile gloves, sterile drape, hand hygiene and skin antiseptic. A timeout was performed prior to the initiation of the procedure.  The right neck was prepped and draped in standard fashion. Ultrasound evaluation demonstrated patent internal jugular vein which was compressible and free of thrombus. A small skin nick was made. 21 gauge micropuncture needle was inserted under direct ultrasound visualization into the internal jugular vein. A micro puncture kit wire was advanced to the level of the right atrium. The micropuncture sheath was introduced and inner dilator and wire removed. A 0.035 inch J-wire was placed to level of the right atrium, sero dilation was performed followed by placement of an 8 French triple-lumen, 20 cm catheter. The lumens flushed and aspirated with ease. The catheter tip was confirmed fluoroscopically to be located at the cavoatrial junction. The catheter was sutured in place and the lines were turned over to Anesthesia for venous access during the procedure.  The right groin was then prepped and draped in standard fashion. Ultrasound evaluation demonstrated a patent right common femoral vein. A small skin nick was made at the planned needle entry site. The right common femoral vein was accessed with a 21 gauge micropuncture needle. A Rosen wire was directed to the inferior vena cava. Serial dilation was performed and a 10 [place] tips sheath was  inserted. A 5 French C2 catheter was directed over the [facility] wire and retracted until gaining access to the left renal vein ostium. The Rosen wire was then reinserted  and position in an inferior branch of the left renal vein. Limited left renal venogram demonstrated patency and brisk outflow into the inferior vena cava. A Kumpe the catheter was then placed through the sheath adjacent to the indwelling safety wire and directed into the gastro renal shunt. Venogram demonstrated patent gastric varices with outflow into the gastro renal shunt. Balloon occlusion venogram via a 5 [place] Fogarty was then performed which demonstrated prominent nidus of gastric varices measuring approximately 4 cm by 1.6 cm with multiple gastric draining veins. There is no significant outflow or reflux into the left gastric, posterior gastric, or short gastric veins. Repeat venogram was performed in a left anterior oblique which demonstrated bifurcation of the gastro renal shunt with a smaller, posterior branch. The indwelling safety catheter was removed and a Progreat Omega microcatheter was advanced into the posterior branch of the spleno renal shunt. Venogram was performed confirming position just inferior to the previously placed coil pack. An assortment of Penumbra detachable of microcoils were then inserted into the superior aspect of this draining vein to prevent outflow of planned sclerosis.  Next, sclerosing of the large network of gastric varices was not pursued. Multiple Fogarty balloon catheters were attempted to occlude the gastro renal shunt which was unsuccessful due to spontaneous rupture of the balloons. Therefore, an 8 [place] Merci balloon occlusion guide catheter was positioned in the inferior aspect of the main channel of the gastro renal shunt with adequate balloon occlusion confirmed by venogram which again demonstrated no significant outflow and adequate embolization of the bifurcated splenorenal shunt branch. A solution of lipiodol, Sotradecol, and room air (1:2:3 ratio) was prepared into a sclerosing foam. A Progreat Omega microcatheter was inserted through the indwelling  occlusion balloon into the varicosities. Under fluoroscopic visualization, the sclerosing admixture was administered slowly. The sclerosing solution filled the largest nidus of gastric varix. There is suggestion of a small amount of sclerosing solution traveling to the fundal gastric veins, which was decided to be the stopping point of sclerosis. The catheter was flushed. Next, an assortment of Penmbra detachable microcoils were delivered through the indwelling microcatheter to embolize the gastro renal shunt outflow. The occlusion balloon was slowly deflated under direct fluoroscopic visualization without evidence of coil or contrast migration. Completion venogram was performed through the indwelling tips sheath in the left renal ostium which demonstrated brisk renal vein outflow but sluggish outflow from the gastro renal shunt, suggestive of adequate sclerosis and embolization.  The sheath was then removed and hemostasis was obtained with manual compression. The patient tolerated the procedure well was transferred back to the ICU intubated and sedated.  IMPRESSION: 1. Technically successful balloon and coil assisted retrograde transvenous obliteration of recanalized gastric varices in the setting of massive hematemesis with associated hemorrhagic shock. 2. Placement of right internal jugular triple-lumen central venous catheter (20 cm) with the catheter tip at the cavoatrial junction. The catheter is ready for immediate use.  PLAN: Continue close monitoring of hemodynamics status. Expect continued melena for several days. Interventional Radiology will continue to follow while inpatient, and arrange for outpatient follow-up visit in 1 month.  Ruthann Cancer, MD  [facility]  [facility]   Electronically Signed   By: Ruthann Cancer MD   On: 11/16/2020 09:21   VAS [place] LOWER EXTREMITY VENOUS (DVT)  Result Date: 11/16/2020  Lower Venous DVT Study Indications:  Edema.  Comparison Study: Prior negative left lower extremity venous duplex done                   10/13/19, is available for comparison Performing Technologist: Sharion Dove RVS  Examination Guidelines: A complete evaluation includes B-mode imaging, spectral Doppler, color Doppler, and power Doppler as needed of all accessible portions of each vessel. Bilateral testing is considered an integral part of a complete examination. Limited examinations for reoccurring indications may be performed as noted. The reflux portion of the exam is performed with the patient in reverse Trendelenburg.  +-----+---------------+---------+-----------+----------+--------------+ RIGHTCompressibilityPhasicitySpontaneityPropertiesThrombus Aging +-----+---------------+---------+-----------+----------+--------------+ CFV  Full           Yes      Yes                                 +-----+---------------+---------+-----------+----------+--------------+   +---------+---------------+---------+-----------+----------+--------------+ LEFT     CompressibilityPhasicitySpontaneityPropertiesThrombus Aging +---------+---------------+---------+-----------+----------+--------------+ CFV      Full           Yes      Yes                                 +---------+---------------+---------+-----------+----------+--------------+ SFJ      Full                                                        +---------+---------------+---------+-----------+----------+--------------+ FV Prox  Full                                                        +---------+---------------+---------+-----------+----------+--------------+ FV Mid   Full                                                        +---------+---------------+---------+-----------+----------+--------------+ FV DistalFull                                                        +---------+---------------+---------+-----------+----------+--------------+  PFV      Full                                                        +---------+---------------+---------+-----------+----------+--------------+ POP      Full           Yes      Yes                                 +---------+---------------+---------+-----------+----------+--------------+ PTV  Full                                                        +---------+---------------+---------+-----------+----------+--------------+ PERO     Full                                                        +---------+---------------+---------+-----------+----------+--------------+     Summary: RIGHT: - No evidence of common femoral vein obstruction.  LEFT: - Findings appear essentially unchanged compared to previous examination. - There is no evidence of deep vein thrombosis in the lower extremity.  *See table(s) above for measurements and observations. Electronically signed by Servando Snare MD on 11/16/2020 at 3:12:14 PM.    Final    [place] EKG SITE RITE  Result Date: 11/15/2020 If Site Rite image not attached, placement could not be confirmed due to current cardiac rhythm.  IR Radiologist Eval & Mgmt  Result Date: 10/27/2020 Please refer to notes tab for details about interventional procedure. (Op Note)  CT Angio Abd/Pel w/ and/or w/o  Result Date: 11/15/2020 CLINICAL DATA:  54 year old with cirrhosis with prior BTRO and TIPS procedure. Patient presents with hematemesis. EXAM: CTA ABDOMEN AND PELVIS WITHOUT AND WITH CONTRAST TECHNIQUE: Multidetector CT imaging of the abdomen and pelvis was performed using the standard protocol during bolus administration of intravenous contrast. Multiplanar reconstructed images and MIPs were obtained and reviewed to evaluate the vascular anatomy. CONTRAST:  158m OMNIPAQUE IOHEXOL 350 MG/ML SOLN COMPARISON:  Ultrasound 10/24/2020 and CTA 08/05/2020 FINDINGS: VASCULAR Aorta: Mild atherosclerotic disease in the abdominal aorta without aneurysm or  dissection. Celiac: Celiac trunk is patent. Limited evaluation of the celiac artery branches due to artifact from the embolization coils. However, splenic artery and hepatic arteries are patent. Incidentally, there is a replaced left hepatic artery coming off the left gastric artery. Left gastric artery originates directly from the abdominal aorta. SMA: Patent without evidence of aneurysm, dissection, vasculitis or significant stenosis. Renals: Bilateral renal arteries are widely patent without aneurysm or dissection. Small accessory renal arteries bilaterally. IMA: Patent. Inflow: Atherosclerotic disease involving the bilateral iliac arteries. External, internal and common iliac arteries are patent bilaterally. Proximal Outflow: Proximal femoral arteries are patent bilaterally. Veins: TIPS stent has a stable configuration and position. There appears to be flow within the stent on the delayed images but this could be better characterized with ultrasound. Multiple embolization coils in the upper abdomen related to variceal embolization. Evidence for varices near the gastric cardia best seen on sequence 7, image 21. Although the patient has had a BRTO procedure, the gastro renal shunt is still patent near the renal vein. No significant varices in the distal esophageal region. IVC and renal veins are patent. Iliac veins are patent. Review of the MIP images confirms the above findings. NON-VASCULAR Lower chest: Dependent densities in both lower lobes are suggestive for atelectasis. No large pleural effusions. Hepatobiliary: Liver has a mildly nodular contour and compatible with cirrhosis. No focal liver lesion. Evidence for stones at the base of the gallbladder without gallbladder distention or inflammatory changes. Small amount of perihepatic ascites. Pancreas: Unremarkable. No pancreatic ductal dilatation or surrounding inflammatory changes. Spleen: Spleen is mildly  prominent for size measuring roughly 13.7 cm in  length. Small amount of ascites in the left upper quadrant. No focal splenic lesion. Adrenals/Urinary Tract: Normal appearance of the adrenal glands. Normal appearance of both kidneys. No hydronephrosis. No suspicious renal lesions. Normal appearance of the urinary bladder. Stomach/Bowel: Diffuse wall thickening involving the rectum and distal sigmoid colon. Mild wall thickening throughout the colon. No evidence for small bowel dilatation. No evidence for a bowel obstruction. The stomach is distended with air-fluid level. High-density material along the dependent aspect of the stomach could represent blood products. Proximal duodenum is distended. Lymphatic: No significant lymph node enlargement in the abdomen or pelvis. Reproductive: Prostate is unremarkable. Other: Trace pelvic ascites with presacral edema. Small amount of ascites in the abdomen, mostly around the liver. Mild mesenteric edema. Musculoskeletal: Bilateral pars defects at L5 without significant anterolisthesis. IMPRESSION: VASCULAR 1. Stable appearance of the TIPS stent. The stent appears to be patent based on the venous phase imaging but this could be better characterized with a dedicated liver duplex ultrasound. 2. Multiple variceal embolization coils. However, there is evidence for residual varices at the gastric cardia region. Despite having had a BRTO procedure, there appears to be residual flow in a gastro renal shunt. Gastro renal shunt may be supplying the gastric cardia varices. 3. No evidence for active arterial bleeding. NON-VASCULAR 1. Cirrhosis with a small amount of ascites. Spleen is prominent for size. 2. Diffuse wall thickening in the colon, most prominent in the rectum and distal sigmoid colon region. Findings are concerning for colitis. Etiology is uncertain but this does represent a change since 08/05/2020. 3. Cholelithiasis. Electronically Signed   By: Markus Daft M.D.   On: 11/15/2020 09:08       The results of significant  diagnostics from this hospitalization (including imaging, microbiology, ancillary and laboratory) are listed below for reference.     Microbiology: Recent Results (from the past 240 hour(s))  Resp Panel by RT-PCR (Flu A&B, Covid) Nasopharyngeal Swab     Status: None   Collection Time: 11/15/20  8:18 AM   Specimen: Nasopharyngeal Swab; Nasopharyngeal(NP) swabs in vial transport medium  Result Value Ref Range Status   SARS Coronavirus 2 by RT PCR NEGATIVE NEGATIVE Final    Comment: (NOTE) SARS-CoV-2 target nucleic acids are NOT DETECTED.  The SARS-CoV-2 RNA is generally detectable in upper respiratory specimens during the acute phase of infection. The lowest concentration of SARS-CoV-2 viral copies this assay can detect is 138 copies/mL. A negative result does not preclude SARS-Cov-2 infection and should not be used as the sole basis for treatment or other patient management decisions. A negative result may occur with  improper specimen collection/handling, submission of specimen other than nasopharyngeal swab, presence of viral mutation(s) within the areas targeted by this assay, and inadequate number of viral copies(<138 copies/mL). A negative result must be combined with clinical observations, patient history, and epidemiological information. The expected result is Negative.  Fact Sheet for Patients:  EntrepreneurPulse.com.au  Fact Sheet for Healthcare Providers:  IncredibleEmployment.be  This test is no t yet approved or cleared by the [place] FDA and  has been authorized for detection and/or diagnosis of SARS-CoV-2 by FDA under an Emergency Use Authorization (EUA). This EUA will remain  in effect (meaning this test can be used) for the duration of the COVID-19 declaration under Section 564(b)(1) of the Act, 21 U.S.C.section 360bbb-3(b)(1), unless the authorization is terminated  or revoked sooner.       Influenza A by  PCR NEGATIVE  NEGATIVE Final   Influenza B by PCR NEGATIVE NEGATIVE Final    Comment: (NOTE) The Xpert Xpress SARS-CoV-2/FLU/RSV plus assay is intended as an aid in the diagnosis of influenza from Nasopharyngeal swab specimens and should not be used as a sole basis for treatment. Nasal washings and aspirates are unacceptable for Xpert Xpress SARS-CoV-2/FLU/RSV testing.  Fact Sheet for Patients: EntrepreneurPulse.com.au  Fact Sheet for Healthcare Providers: IncredibleEmployment.be  This test is not yet approved or cleared by the [place] FDA and has been authorized for detection and/or diagnosis of SARS-CoV-2 by FDA under an Emergency Use Authorization (EUA). This EUA will remain in effect (meaning this test can be used) for the duration of the COVID-19 declaration under Section 564(b)(1) of the Act, 21 U.S.C. section 360bbb-3(b)(1), unless the authorization is terminated or revoked.  Performed at [facility], [place] [address], [place], [place] [address]      Labs: BNP (last 3 results) Recent Labs    11/19/20 1056  BNP 553.7*   Basic Metabolic Panel: Recent Labs  Lab 11/15/20 0954 11/15/20 1109 11/16/20 0455 11/16/20 0800 11/16/20 0830 11/16/20 2000 11/17/20 0254 11/18/20 0409 11/19/20 1056 11/20/20 0324  NA  --    < > 143  --  146*  --  148* 146* 138 137  K  --    < > 4.9  --  4.8  --  3.8 3.0* 3.2* 3.4*  CL  --    < > 109  --   --   --  111 108 105 103  CO2  --    < > 26  --   --   --  29 29 25 26   GLUCOSE  --    < > 254*  --   --   --  219* 106* 250* 170*  BUN  --    < > 20  --   --   --  30* 20 11 5*  CREATININE  --    < > 0.88  --   --   --  1.14 0.77 0.84 0.74  CALCIUM  --    < > 6.8*  --   --   --  7.6* 7.4* 7.9* 8.1*  MG 1.2*  --   --  1.1*  --  1.7 1.7 1.9 1.6*  --   PHOS 3.8  --   --   --   --   --   --   --  2.4*  --    < > = values in this interval not displayed.   Liver Function Tests: Recent Labs  Lab  11/15/20 0500 11/16/20 0455 11/17/20 0254 11/18/20 0409 11/19/20 1056  AST 38 32 44* 62* 62*  ALT 18 12 16 17 20   ALKPHOS 111 64 61 65 77  BILITOT 2.6* 4.1* 2.8* 2.5* 1.9*  PROT 6.2* 4.7* 4.8* 4.9* 5.2*  ALBUMIN 2.5* 2.3* 2.3* 2.3* 2.4*   Recent Labs  Lab 11/15/20 0500  LIPASE 34   Recent Labs  Lab 11/15/20 0953 11/16/20 0753 11/19/20 1056  AMMONIA 75* 265* 91*   CBC: Recent Labs  Lab 11/16/20 0455 11/16/20 0830 11/16/20 2000 11/17/20 0254 11/18/20 0409 11/19/20 1056 11/20/20 0324  WBC 5.8  --   --  4.0 4.8 4.2 4.3  NEUTROABS  --   --   --   --   --  2.8  --   HGB 8.5*   < > 8.3* 7.9* 8.1* 9.3*  9.1*  HCT 26.8*   < > 26.7* 24.6* 26.0* 28.3* 26.9*  MCV 108.9*  --   --  109.8* 112.1* 107.2* 105.5*  PLT 53*  --   --  57* 47* 42* 60*   < > = values in this interval not displayed.   Cardiac Enzymes: No results for input(s): CKTOTAL, CKMB, CKMBINDEX, TROPONINI in the last 168 hours. BNP: Invalid input(s): POCBNP CBG: Recent Labs  Lab 11/19/20 0745 11/19/20 1205 11/19/20 1619 11/19/20 2140 11/20/20 0635  GLUCAP 158* 287* 156* 208* 167*   D-Dimer No results for input(s): DDIMER in the last 72 hours. Hgb A1c No results for input(s): HGBA1C in the last 72 hours. Lipid Profile Recent Labs    11/18/20 0409  TRIG 91   Thyroid function studies No results for input(s): TSH, T4TOTAL, T3FREE, THYROIDAB in the last 72 hours.  Invalid input(s): FREET3 Anemia work up No results for input(s): VITAMINB12, FOLATE, FERRITIN, TIBC, IRON, RETICCTPCT in the last 72 hours. Urinalysis    Component Value Date/Time   COLORURINE AMBER (A) 11/17/2020 0645   APPEARANCEUR CLEAR 11/17/2020 0645   LABSPEC 1.031 (H) 11/17/2020 0645   PHURINE 5.0 11/17/2020 0645   GLUCOSEU NEGATIVE 11/17/2020 0645   HGBUR SMALL (A) 11/17/2020 0645   BILIRUBINUR NEGATIVE 11/17/2020 0645   KETONESUR NEGATIVE 11/17/2020 0645   PROTEINUR NEGATIVE 11/17/2020 0645   NITRITE NEGATIVE 11/17/2020  0645   LEUKOCYTESUR NEGATIVE 11/17/2020 0645   Sepsis Labs Invalid input(s): PROCALCITONIN,  WBC,  LACTICIDVEN   Time coordinating discharge: 35 minutes  SIGNED:  Mercy Riding, MD  [facility] 11/20/2020, 9:00 AM  If 7PM-7AM, please contact night-coverage [email or web address]

## 2020-11-21 ENCOUNTER — Other Ambulatory Visit: Payer: Self-pay

## 2020-11-21 ENCOUNTER — Encounter: Payer: Self-pay | Admitting: Family Medicine

## 2020-11-21 ENCOUNTER — Ambulatory Visit (INDEPENDENT_AMBULATORY_CARE_PROVIDER_SITE_OTHER): Payer: Managed Care, Other (non HMO) | Admitting: Family Medicine

## 2020-11-21 VITALS — BP 122/76 | HR 86 | Temp 96.9°F | Ht 75.0 in | Wt 296.0 lb

## 2020-11-21 DIAGNOSIS — K920 Hematemesis: Secondary | ICD-10-CM | POA: Diagnosis not present

## 2020-11-21 DIAGNOSIS — F1010 Alcohol abuse, uncomplicated: Secondary | ICD-10-CM

## 2020-11-21 DIAGNOSIS — I864 Gastric varices: Secondary | ICD-10-CM | POA: Diagnosis not present

## 2020-11-21 DIAGNOSIS — D696 Thrombocytopenia, unspecified: Secondary | ICD-10-CM

## 2020-11-21 DIAGNOSIS — Z09 Encounter for follow-up examination after completed treatment for conditions other than malignant neoplasm: Secondary | ICD-10-CM | POA: Diagnosis not present

## 2020-11-21 DIAGNOSIS — E118 Type 2 diabetes mellitus with unspecified complications: Secondary | ICD-10-CM

## 2020-11-21 DIAGNOSIS — F32A Depression, unspecified: Secondary | ICD-10-CM

## 2020-11-21 MED ORDER — PAROXETINE HCL 40 MG PO TABS
40.0000 mg | ORAL_TABLET | Freq: Every day | ORAL | 1 refills | Status: DC
Start: 2020-11-21 — End: 2021-08-23

## 2020-11-21 NOTE — Patient Instructions (Addendum)
Take effexor 25mg  for 1 week, then take 1 tab every other day for 1 week.  Then discontinue.  For the paxil cont with 1 tab daily 20mg .  Then when off the effexor, start taking the new dose of 40mg  paxil.   Call if having any concerns.

## 2020-11-21 NOTE — Progress Notes (Signed)
Patient ID: Bradley Miles, male    DOB: 1966/07/22, 54 y.o.   MRN: [id number]   Chief Complaint  Patient presents with  . Hospitalization Follow-up   Subjective:    HPI Pt here for hospital follow up for GI bleeding. Pt went to hospital for GI bleed.  Pt was in [facility] for 5 days. Pt doing "wonderful" per wife. Some conflicting meds on med list that pt/pt wife would like to discuss. Pt wanting to know when he can return to work. 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 Pt has h/o alcoholic cirrhosis, portal htn, hepatic encephalopathy, and alcohol dependence. Pt has had TIPS procedue with emoblization of gastric variceal supply. Has had multiple blood transfusions and FFP.  Pt is now s/p BRTO in 11/21/20. And tips with embolization in 11/23/20.  Pt has been counseled many times by GI doctors and by me about needing to abstain from alcohol and take his medications as prescribed.  Seems non compliant with the nadolol, lactulose, and rifaximin.  Pt stating to GI, no longer drinking 2 fifths of liquor per day, but does drink 4-5 glasses of wine daily, occ skipping a day.  Pt continues to drink after returning from rehab.  Has been abstaining from alcohol since leaving the hospital this time for the GI bleeding.  Admit date: 11/15/2020 Discharge date: 11/20/2020   Recommendations for Outpatient Follow-up:  1. Follow ups as below. 2. Please obtain CBC/BMP/Mag at follow up 3. May consider adding insulin for his diabetes. 4. Please follow up on the following pending results: None  Has f/yu Dr. Sydell Axon 8am. Not having blood or black stools. No further esophageal varicies. Got 2 units prbcs.  Pt is feeling better.  No longer having blood in stools or hematemesis.   Medical History Bradley Miles has a past medical history of Anxiety, Controlled type 2 diabetes mellitus with complication, without long-term current use of insulin ([place]) (06/26/2020),  Enlarged liver, GERD (gastroesophageal reflux disease), Hypertension, Palpitations, PVC's (premature ventricular contractions), Shortness of breath, Tussive syncope (12/22/2019), and Varicose veins.   Outpatient Encounter Medications as of 11/21/2020  Medication Sig  . allopurinol (ZYLOPRIM) 100 MG tablet TAKE 1 TABLET BY MOUTH EVERY DAY (Patient taking differently: Take 100 mg by mouth daily.)  . blood glucose meter kit and supplies Dispense based on patient and insurance preference. Test blood sugar once daily.  (FOR ICD-10 E10.9, E11.9).  Marland Kitchen dexlansoprazole (DEXILANT) 60 MG capsule Take 1 capsule (60 mg total) by mouth daily.  . ferrous sulfate 325 (65 FE) MG EC tablet Take 1 tablet (325 mg total) by mouth 2 (two) times daily.  . folic acid (FOLVITE) 153 MCG tablet Take 400 mcg by mouth daily. (Patient not taking: Reported on 11/23/2020)  . furosemide (LASIX) 40 MG tablet Take 0.5 tablets (20 mg total) by mouth daily. Take Daily for 5 Days, Then Take Daily Only As Needed for Edema (Patient taking differently: Take 40 mg by mouth daily.)  . gabapentin (NEURONTIN) 300 MG capsule Take 1 capsule (300 mg total) by mouth 3 (three) times daily.  Marland Kitchen lactulose, encephalopathy, (CHRONULAC) 10 GM/15ML SOLN Take 15-30cc 1-2 daily to Titrate for 3-4 soft bowel movements a day (Patient taking differently: Take 10-20 g by mouth See admin instructions. Take 15-30cc 1-2 daily to Titrate for 3-4 soft bowel movements a day)  . nadolol (CORGARD) 40 MG tablet Take 1.5 tablets (60 mg total) by mouth daily.  . ondansetron (  ZOFRAN-ODT) 4 MG disintegrating tablet DISSOLVE 1 TABLET ON THE TONGUE EVERY 8 HOURS AS NEEDED FOR NAUSEA (Patient taking differently: Take 4 mg by mouth every 8 (eight) hours as needed for nausea or vomiting.)  . PARoxetine (PAXIL) 40 MG tablet Take 1 tablet (40 mg total) by mouth daily.  . potassium chloride SA (KLOR-CON) 20 MEQ tablet Take 1 tablet (20 mEq total) by mouth daily as needed (Take when  taking Lasix).  . rifaximin (XIFAXAN) 550 MG TABS tablet Take 1 tablet (550 mg total) by mouth 2 (two) times daily. (Patient not taking: No sig reported)  . sildenafil (VIAGRA) 100 MG tablet TAKE 1/2 TABLET BY MOUTH EVERY DAY (Patient taking differently: Take 50 mg by mouth as needed for erectile dysfunction.)  . sitaGLIPtin (JANUVIA) 50 MG tablet Take 1 tablet (50 mg total) by mouth daily. (Patient taking differently: Take 100 mg by mouth daily.)  . sucralfate (CARAFATE) 1 g tablet TAKE 1 TABLET BY MOUTH BEFORE MEALS (Patient taking differently: Take 1 g by mouth in the morning, at noon, and at bedtime.)  . traZODone (DESYREL) 50 MG tablet TAKE 1 TABLET BY MOUTH AT BEDTIME AS NEEDED FOR INSOMNIA  . [DISCONTINUED] Cholecalciferol (D3-1000) 25 MCG (1000 UT) capsule Take 1,000 Units by mouth daily. (Patient not taking: Reported on 11/23/2020)  . [DISCONTINUED] Multiple Vitamin (MULTIVITAMIN WITH MINERALS) TABS tablet Take 1 tablet by mouth daily. (Patient not taking: Reported on 11/23/2020)  . [DISCONTINUED] PARoxetine (PAXIL) 20 MG tablet Take 1 tablet (20 mg total) by mouth daily. (Patient not taking: Reported on 11/21/2020)  . [DISCONTINUED] thiamine 100 MG tablet Take 1 tablet (100 mg total) by mouth daily. (Patient not taking: Reported on 11/23/2020)  . [DISCONTINUED] venlafaxine (EFFEXOR) 25 MG tablet Take 25 mg by mouth 2 (two) times daily.    No facility-administered encounter medications on file as of 11/21/2020.     Review of Systems  Constitutional: Negative for chills and fever.  HENT: Negative for congestion, rhinorrhea and sore throat.   Respiratory: Negative for cough, shortness of breath and wheezing.   Cardiovascular: Negative for chest pain and leg swelling.  Gastrointestinal: Negative for abdominal pain, blood in stool, diarrhea, nausea and vomiting.  Genitourinary: Negative for dysuria and frequency.  Skin: Negative for rash.  Neurological: Negative for dizziness, weakness  and headaches.     Vitals BP 122/76   Pulse 86   Temp (!) 96.9 F (36.1 C)   Ht 6' 3"  (1.905 m)   Wt 296 lb (134.3 kg)   SpO2 94%   BMI 37.00 kg/m   Objective:   Physical Exam Vitals and nursing note reviewed.  Constitutional:      General: He is not in acute distress.    Appearance: Normal appearance. He is not ill-appearing.  HENT:     Head: Normocephalic.     Nose: Nose normal. No congestion.     Mouth/Throat:     Mouth: Mucous membranes are moist.     Pharynx: No oropharyngeal exudate.  Eyes:     Extraocular Movements: Extraocular movements intact.     Conjunctiva/sclera: Conjunctivae normal.     Pupils: Pupils are equal, round, and reactive to light.  Cardiovascular:     Rate and Rhythm: Normal rate and regular rhythm.     Pulses: Normal pulses.     Heart sounds: Normal heart sounds. No murmur heard.   Pulmonary:     Effort: Pulmonary effort is normal.     Breath sounds: Normal breath  sounds. No wheezing, rhonchi or rales.  Musculoskeletal:        General: Normal range of motion.     Right lower leg: No edema.     Left lower leg: No edema.  Skin:    General: Skin is warm and dry.     Findings: No rash.  Neurological:     General: No focal deficit present.     Mental Status: He is alert and oriented to person, place, and time.     Cranial Nerves: No cranial nerve deficit.  Psychiatric:        Mood and Affect: Mood normal.        Behavior: Behavior normal.        Thought Content: Thought content normal.        Judgment: Judgment normal.      Assessment and Plan   1. Hospital discharge follow-up  2. Gastrointestinal hemorrhage with hematemesis - CBC - CMP14+EGFR  3. Hypomagnesemia - Magnesium  4. Gastric varices  5. Alcohol abuse  6. Thrombocytopenia ([place])  7. Diabetes mellitus type 2 with complications ([place])  8. Depression, unspecified depression type - PARoxetine (PAXIL) 40 MG tablet; Take 1 tablet (40 mg total) by mouth daily.   Dispense: 90 tablet; Refill: 1   Pt wanting to discontinue the effexor that was given to him by the rehab center and go back to paxil 52m, and we will increase the paxil to 444m   Alcoholic cirrhosis, GIB, and h/o hepatic encephalopathy- poor prognosis, due to his continued drinking.  Pt has been counseled on needing to abstain from alcohol and needing to take the medications as directed for his liver.  Pt to cont to go to AA or get an alcohol cessation counselor. Pt needng Xifaxan, but needing a PA from his GI office. Cont with lactulose with goal of 3 stools per day.  Cont all other medications as directed from hospital discharge. Pt in agreement.  Pt wanting to return to work, but given his procedure and blood transfusions, pt is weak and not likely able to return to work for several weeks. Pt to f/u with GI in 4-6 wks for recheck.  Call or go to ER if having any further GI bleeding.  F/u [id number] prn.

## 2020-11-22 LAB — CMP14+EGFR
ALT: 21 IU/L (ref 0–44)
AST: 47 IU/L — ABNORMAL HIGH (ref 0–40)
Albumin/Globulin Ratio: 0.8 — ABNORMAL LOW (ref 1.2–2.2)
Albumin: 2.5 g/dL — ABNORMAL LOW (ref 3.8–4.9)
Alkaline Phosphatase: 120 IU/L (ref 44–121)
BUN/Creatinine Ratio: 8 — ABNORMAL LOW (ref 9–20)
BUN: 6 mg/dL (ref 6–24)
Bilirubin Total: 1.5 mg/dL — ABNORMAL HIGH (ref 0.0–1.2)
CO2: 26 mmol/L (ref 20–29)
Calcium: 8.4 mg/dL — ABNORMAL LOW (ref 8.7–10.2)
Chloride: 105 mmol/L (ref 96–106)
Creatinine, Ser: 0.71 mg/dL — ABNORMAL LOW (ref 0.76–1.27)
GFR calc Af Amer: 125 mL/min/{1.73_m2} (ref >59)
GFR calc non Af Amer: 108 mL/min/{1.73_m2} (ref >59)
Globulin, Total: 3.3 g/dL (ref 1.5–4.5)
Glucose: 151 mg/dL — ABNORMAL HIGH (ref 65–99)
Potassium: 3.6 mmol/L (ref 3.5–5.2)
Sodium: 142 mmol/L (ref 134–144)
Total Protein: 5.8 g/dL — ABNORMAL LOW (ref 6.0–8.5)

## 2020-11-22 LAB — CBC
Hematocrit: 28.2 % — ABNORMAL LOW (ref 37.5–51.0)
Hemoglobin: 9.5 g/dL — ABNORMAL LOW (ref 13.0–17.7)
MCH: 34.4 pg — ABNORMAL HIGH (ref 26.6–33.0)
MCHC: 33.7 g/dL (ref 31.5–35.7)
MCV: 102 fL — ABNORMAL HIGH (ref 79–97)
Platelets: 79 10*3/uL — CL (ref 150–450)
RBC: 2.76 x10E6/uL — ABNORMAL LOW (ref 4.14–5.80)
RDW: 15.6 % — ABNORMAL HIGH (ref 11.6–15.4)
WBC: 5.5 10*3/uL (ref 3.4–10.8)

## 2020-11-22 LAB — MAGNESIUM: Magnesium: 1.4 mg/dL — ABNORMAL LOW (ref 1.6–2.3)

## 2020-11-23 ENCOUNTER — Ambulatory Visit: Payer: Managed Care, Other (non HMO) | Admitting: Nurse Practitioner

## 2020-11-23 ENCOUNTER — Other Ambulatory Visit: Payer: Self-pay

## 2020-11-23 ENCOUNTER — Telehealth: Payer: Self-pay | Admitting: Internal Medicine

## 2020-11-23 ENCOUNTER — Encounter: Payer: Self-pay | Admitting: Nurse Practitioner

## 2020-11-23 VITALS — BP 122/70 | HR 75 | Temp 96.6°F | Ht 73.0 in | Wt 288.8 lb

## 2020-11-23 DIAGNOSIS — K920 Hematemesis: Secondary | ICD-10-CM

## 2020-11-23 DIAGNOSIS — I8501 Esophageal varices with bleeding: Secondary | ICD-10-CM

## 2020-11-23 DIAGNOSIS — I864 Gastric varices: Secondary | ICD-10-CM

## 2020-11-23 DIAGNOSIS — F1029 Alcohol dependence with unspecified alcohol-induced disorder: Secondary | ICD-10-CM

## 2020-11-23 DIAGNOSIS — K922 Gastrointestinal hemorrhage, unspecified: Secondary | ICD-10-CM

## 2020-11-23 DIAGNOSIS — K7031 Alcoholic cirrhosis of liver with ascites: Secondary | ICD-10-CM | POA: Diagnosis not present

## 2020-11-23 DIAGNOSIS — K766 Portal hypertension: Secondary | ICD-10-CM | POA: Diagnosis not present

## 2020-11-23 DIAGNOSIS — R1030 Lower abdominal pain, unspecified: Secondary | ICD-10-CM

## 2020-11-23 DIAGNOSIS — J9601 Acute respiratory failure with hypoxia: Secondary | ICD-10-CM

## 2020-11-23 MED ORDER — ADULT MULTIVITAMIN W/MINERALS CH: 1.0000 | ORAL_TABLET | Freq: Every day | ORAL | 3 refills | Status: DC

## 2020-11-23 MED ORDER — SPIRONOLACTONE 100 MG PO TABS: 100.0000 mg | ORAL_TABLET | Freq: Every day | ORAL | 2 refills | Status: DC

## 2020-11-23 MED ORDER — THIAMINE HCL 100 MG PO TABS: 100.0000 mg | ORAL_TABLET | Freq: Every day | ORAL | 1 refills | Status: DC

## 2020-11-23 MED ORDER — D3-1000 25 MCG (1000 UT) PO CAPS: 1000.0000 [IU] | ORAL_CAPSULE | Freq: Every day | ORAL | 3 refills | Status: DC

## 2020-11-23 NOTE — Patient Instructions (Signed)
Your health issues we discussed today were:   Alcoholic cirrhosis with recent GI bleed: 1. Continue alcohol abstinence 2. Work on establishing with a counselor 3. I have printed/sent prescriptions for you vitamins/supplements you need for deficiencies related to alcoholism and cirrhosis (multivitamin, thiamine, vitamin D3) 4. It is very important you are compliant with your medications, especially lactulose and (when we can get it approved) Xifaxan 5. Name for dietary protein intake of 1.3 g/kg (approximately 150 g a day based on your current/normal weight) 6. I sent a prescription for spironolactone (Aldactone) 100 mg.  Take this once a day 7. Continue to take Lasix 8. Have your kidney function and electrolytes checked in 1 week 9. Call if you have any worsening problems  Overall I recommend:  1. Continue your other current medications 2. Return for follow-up in 4 to 6 weeks 3. Call us for any questions or concerns   ---------------------------------------------------------------  I am glad you have gotten your COVID-19 vaccination!  Even though you are fully vaccinated you should continue to follow CDC and state/local guidelines.  ---------------------------------------------------------------   At [facility] we value your feedback. You may receive a survey about your visit today. Please share your experience as we strive to create trusting relationships with our patients to provide genuine, compassionate, quality care.  We appreciate your understanding and patience as we review any laboratory studies, imaging, and other diagnostic tests that are ordered as we care for you. Our office policy is 5 business days for review of these results, and any emergent or urgent results are addressed in a timely manner for your best interest. If you do not hear from our office in 1 week, please contact us.   We also encourage the use of MyChart, which contains your medical  information for your review as well. If you are not enrolled in this feature, an access code is on this after visit summary for your convenience. Thank you for allowing [place] to be involved in your care.  It was great to see you today!  I hope you have a Merry Christmas and Happy Holidays!!

## 2020-11-23 NOTE — Telephone Encounter (Signed)
Pt and wife are aware and were told to come before 330 pm to pick up his forms.

## 2020-11-23 NOTE — Progress Notes (Signed)
Referring Provider: Erven Colla, DO Primary Care Physician:  Erven Colla, DO Primary GI:  Dr. Gala Romney  Chief Complaint  Patient presents with  . hospital follow-up    Abd swollen, fatigue    HPI:   Bradley Miles is a 54 y.o. male who presents for posthospitalization follow-up. The patient was recently seen in our office 11/10/2020 for routine follow-up of alcoholic cirrhosis, portal hypertension, acute hepatic encephalopathy. Noted history of alcoholic cirrhosis, hepatic encephalopathy with alcohol dependence.  Has had ongoing progressive worsening of mental status.  Lactulose seem to help.  Has intermittently tried to be alcohol free but subsequently typically relapses.  He has been counseled appropriately for complications related to cirrhosis including low-salt diet, protein intake, alcohol avoidance, lactulose, Xifaxan, etc.  He is intermittently a no-show for his follow-ups.  Previous GI care and complications:  He has had hospitalizations related to complications from cirrhosis.  At the end of 2020 he was admitted and admitted drinking fifth of liquor daily.  During this hospitalization he developed a large volume hematemesis for which required transfer to [facility] and ICU stay for CTA and IR consult.  He underwent a TIPS procedure with embolization of the gastric variceal supply.  During his hospitalization he received a total of 11 units of blood, 3 units of FFP, and 1 dose of vitamin K. Now status post BRTO on 11/21/2020 and tips with embolization of 11/23/2020.  The patient was again admitted to the hospital from 08/04/2020 03/08/2020 for hepatic encephalopathy, and acute GI bleed.  Noted hematemesis and melena CTA with no active bleed and hemoglobin downward trend from 12.6 on admission to 10.6 at discharge.    He was treated with PPI, IV octreotide, and discharged home on oral Dexilant.  INR elevated 1.7 treated with vitamin K which had a resulting decline to 1.3.  EGD  completed without active bleeding noted. Also noted hepatic encephalopathy with ammonia elevated at 121 and likely due to noncompliance with lactulose and rifaximin.  Also noncompliant with nadolol incidentally.  He was treated with IV Rocephin for SBP prophylaxis and discharged on Omnicef.  During his hospitalization he admitted drinking heavily up to two fifths of liquor a day and he was subsequently placed on CIWA protocol during his hospitalization.  At his last visit admitted to still drinking 4-5 servings (5 ounces) of wine a day though he does have "skip days".  He went to detox in September and no longer drinking two fifths of liquor daily.  Admitted nausea and vomiting because "I took lactulose at the same time as eating" although his wife pointed out that he was also drinking alcohol at that time which he did admit.  Continued noncompliance with appropriate lactulose dosing only taking once a day with one bowel movement daily.  EGD noted up to date.  We revisited counseling on medication compliance, lactulose dosing, alcohol avoidance, low-sodium diet.  Recommended lactulose for 3-4 soft bowel movements a day, contact primary care to refill antidepressants, update labs including CBC, CMP, INR, AFP as well as right upper quadrant ultrasound for hepatoma screening/surveillance and follow-up in 6 months.  Recent hospitalization:  The patient was again admitted to the hospital at [facility] from 11/15/2020 through 11/20/2020 for recurrent life-threatening bleed.  He was admitted with decompensated alcoholic cirrhosis with ongoing alcohol abuse and recurrent upper GI bleed with large volume hematemesis.  This is been his second or third life-threatening bleed in the past year.  Known history  of alcoholic cirrhosis, ongoing alcohol abuse, portal hypertension, hepatic encephalopathy, variceal bleeds with TIPS and BRTO in December 2020.  He presented with acute hematemesis and black tarry stool as well as  confusion after drinking alcohol again.  He required intubation for airway protection.  EGD demonstrated recurrent gastric varices but unable to locate source of bleed due to significant amount of blood and clot in the stomach.  He was appropriately treated with IV Rocephin for SBP prophylaxis in the setting of GI bleed and an alcoholic cirrhotic.  He was again encouraged to quit drinking alcohol.  Noted noncompliance with lactulose and other medications and he was again encouraged to be compliant with his medications to prevent complications.  Unfortunately, nursing notes during his hospital stay indicates continued noncompliance.  He underwent interventional radiology procedure on 11/15/2020 including ultrasound and fluoroscopic guided central line placement, retrograde cannulation of the gastric renal shunt, embolization of posterior bifurcation of the gastrorenal shunt, and balloon and coil assisted retrograde transvenous obliteration of gastric varices due to recurrent life-threatening liver related GI bleed.  Hospital follow-up:  He saw his primary care the day after hospital discharge, on 11/21/2020.  At that time the patient and his wife stated he was doing "wonderful".  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  They recommended follow-up with GI as booked, no further blood or black stools.  Labs completed by primary care include CBC which showed hemoglobin stable at 9.5, platelets low at 79, though improved since hospitalization when they were in the 40s to 60s.  CMP with improving bilirubin now at 1.5, alkaline phosphatase normal at 120, transaminases with mild elevation of AST at 47, normal ALT at 21.  Today he states he is doing okay overall. 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 Still with weakness (wife states intermittently). Discussed workplace safety with heavy  lifting. Denies any further melena, hematochezia. Some watery stools but "nice and brown." No further N/V. Has some intermittent bilateral lower quadrant/side pain. Thinks he has abdominal swelling. They tried to get Xifaxan and was told it needs a PA. They told her the PA was sent to our office multiple times. Hasn't been taking supplements, planning to refill them. Is asking for Rx for supplements and MVI. Denies any further drinking since hospital discharge (3 days ago). He states he is taking lactulose "enough for 3-4 pops a day". Wife states he's picky/choosy and wont take it if he has to leave the house. Is taking Nadolol. Main complaints are swelling, fatigue, weakness. Denies URI or flu-like symptoms. Denies loss of sense of taste or smell. The patient has received COVID-19 vaccination(s).  Denies chest pain, dyspnea, dizziness, lightheadedness, syncope, near syncope. Denies any other upper or lower GI symptoms.  Past Medical History:  Diagnosis Date  . Anxiety   . Controlled type 2 diabetes mellitus with complication, without long-term current use of insulin ([place]) 06/26/2020  . Enlarged liver   . GERD (gastroesophageal reflux disease)   . Hypertension   . Palpitations   . PVC's (premature ventricular contractions)   . Shortness of breath   . Tussive syncope 12/22/2019  . Varicose veins     Past Surgical History:  Procedure Laterality Date  . BIOPSY  02/02/2019   Procedure: BIOPSY;  Surgeon: Daneil Dolin, MD;  [facility];  Service: Endoscopy;;  gastric  . COLONOSCOPY WITH ESOPHAGOGASTRODUODENOSCOPY (EGD)  12/16/2012   internal hemorrhoids, colonic diverticulosis, benign polyps, screening in 2024. EGD with mild erosive reflux esophagitis, small hiatal hernia, negative H.pylori  . cyst reomved     from spine  . ESOPHAGOGASTRODUODENOSCOPY  05/19/09   RMR: Geographic distal esophageal erosions consistent with severe erosive reflux esophagitis. schatzki's ring s/P dilation/small  hiatal hernia otherwise normal stomach  . ESOPHAGOGASTRODUODENOSCOPY (EGD) WITH PROPOFOL N/A 02/02/2019   Dr. Gala Romney: retained gastric contents. portal HTN gastropathy  . ESOPHAGOGASTRODUODENOSCOPY (EGD) WITH PROPOFOL N/A 11/20/2019   Procedure: ESOPHAGOGASTRODUODENOSCOPY (EGD) WITH PROPOFOL;  Surgeon: Daneil Dolin, MD;  [facility];  Service: Endoscopy;  Laterality: N/A;  . ESOPHAGOGASTRODUODENOSCOPY (EGD) WITH PROPOFOL N/A 11/24/2019   Procedure: ESOPHAGOGASTRODUODENOSCOPY (EGD) WITH PROPOFOL;  Surgeon: Ronnette Juniper, MD;  Location: [facility];  Service: Gastroenterology;  Laterality: N/A;  . ESOPHAGOGASTRODUODENOSCOPY (EGD) WITH PROPOFOL N/A 08/08/2020   Procedure: ESOPHAGOGASTRODUODENOSCOPY (EGD) WITH PROPOFOL;  Surgeon: Daneil Dolin, MD;  [facility];  Service: Endoscopy;  Laterality: N/A;  . ESOPHAGOGASTRODUODENOSCOPY (EGD) WITH PROPOFOL N/A 11/15/2020   Procedure: ESOPHAGOGASTRODUODENOSCOPY (EGD) WITH PROPOFOL;  Surgeon: Ladene Artist, MD;  Location: [facility] END[facility]COPY;  Service: Endoscopy;  Laterality: N/A;  . IR ANGIOGRAM SELECTIVE EACH ADDITIONAL VESSEL  11/22/2019  . IR ANGIOGRAM SELECTIVE EACH ADDITIONAL VESSEL  11/24/2019  . IR ANGIOGRAM SELECTIVE EACH ADDITIONAL VESSEL  11/24/2019  . IR ANGIOGRAM SELECTIVE EACH ADDITIONAL VESSEL  11/24/2019  . IR ANGIOGRAM SELECTIVE EACH ADDITIONAL VESSEL  11/15/2020  . IR EMBO ART  VEN HEMORR LYMPH EXTRAV  INC GUIDE ROADMAPPING  11/22/2019  . IR EMBO ART  VEN HEMORR LYMPH EXTRAV  INC GUIDE ROADMAPPING  11/24/2019  . IR EMBO ART  VEN HEMORR LYMPH EXTRAV  INC GUIDE ROADMAPPING  11/15/2020  . IR FLUORO GUIDE CV LINE RIGHT  11/15/2020  . IR RADIOLOGIST EVAL & MGMT  12/29/2019  . IR RADIOLOGIST EVAL & MGMT  10/27/2020  . IR TIPS  11/24/2019  . IR US GUIDE VASC ACCESS RIGHT  11/22/2019  . IR US GUIDE VASC ACCESS RIGHT  11/15/2020  . IR US GUIDE VASC ACCESS RIGHT  11/15/2020  . IR VENOGRAM RENAL UNI LEFT  11/22/2019  . IR VENOGRAM  RENAL UNI LEFT  11/15/2020  . RADIOLOGY WITH ANESTHESIA N/A 11/24/2019   Procedure: IR WITH ANESTHESIA;  Surgeon: Radiologist, Medication, MD;  Location: [facility];  Service: Radiology;  Laterality: N/A;  . RADIOLOGY WITH ANESTHESIA N/A 11/15/2020   Procedure: IR WITH ANESTHESIA;  Surgeon: Suzette Battiest, MD;  Location: [facility];  Service: Radiology;  Laterality: N/A;  . TIPS PROCEDURE N/A 11/22/2019   Procedure: BRTO/TRANS-JUGULAR INTRAHEPATIC PORTAL SHUNT (TIPS);  Surgeon: Corrie Mckusick, DO;  Location: [facility];  Service: Anesthesiology;  Laterality: N/A;  . WISDOM TOOTH EXTRACTION      Current Outpatient Medications  Medication Sig Dispense Refill  . allopurinol (ZYLOPRIM) 100 MG tablet TAKE 1 TABLET BY MOUTH EVERY DAY (Patient taking differently: Take 100 mg by mouth daily.) 30 tablet 5  . blood glucose meter kit and supplies Dispense based on patient and insurance preference. Test blood sugar once daily.  (FOR ICD-10 E10.9, E11.9). 1 each 5  . dexlansoprazole (DEXILANT) 60 MG capsule Take 1 capsule (60 mg total) by mouth daily. 30 capsule 3  . ferrous sulfate 325 (65 FE) MG EC tablet Take 1 tablet (325 mg total) by mouth 2 (two) times daily. 60 tablet 3  . furosemide (LASIX) 40  MG tablet Take 0.5 tablets (20 mg total) by mouth daily. Take Daily for 5 Days, Then Take Daily Only As Needed for Edema (Patient taking differently: Take 40 mg by mouth daily.) 30 tablet 11  . gabapentin (NEURONTIN) 300 MG capsule Take 1 capsule (300 mg total) by mouth 3 (three) times daily. 90 capsule 1  . lactulose, encephalopathy, (CHRONULAC) 10 GM/15ML SOLN Take 15-30cc 1-2 daily to Titrate for 3-4 soft bowel movements a day (Patient taking differently: Take 10-20 g by mouth See admin instructions. Take 15-30cc 1-2 daily to Titrate for 3-4 soft bowel movements a day) 1892 mL 11  . nadolol (CORGARD) 40 MG tablet Take 1.5 tablets (60 mg total) by mouth daily. 135 tablet 3  . ondansetron (ZOFRAN-ODT) 4 MG disintegrating tablet  DISSOLVE 1 TABLET ON THE TONGUE EVERY 8 HOURS AS NEEDED FOR NAUSEA (Patient taking differently: Take 4 mg by mouth every 8 (eight) hours as needed for nausea or vomiting.) 24 tablet 2  . PARoxetine (PAXIL) 40 MG tablet Take 1 tablet (40 mg total) by mouth daily. 90 tablet 1  . potassium chloride SA (KLOR-CON) 20 MEQ tablet Take 1 tablet (20 mEq total) by mouth daily as needed (Take when taking Lasix). 30 tablet 11  . sildenafil (VIAGRA) 100 MG tablet TAKE 1/2 TABLET BY MOUTH EVERY DAY (Patient taking differently: Take 50 mg by mouth as needed for erectile dysfunction.) 3 tablet 3  . sitaGLIPtin (JANUVIA) 50 MG tablet Take 1 tablet (50 mg total) by mouth daily. (Patient taking differently: Take 100 mg by mouth daily.) 90 tablet 1  . sucralfate (CARAFATE) 1 g tablet TAKE 1 TABLET BY MOUTH BEFORE MEALS (Patient taking differently: Take 1 g by mouth in the morning, at noon, and at bedtime.) 90 tablet 1  . traZODone (DESYREL) 50 MG tablet TAKE 1 TABLET BY MOUTH AT BEDTIME AS NEEDED FOR INSOMNIA 30 tablet 2  . Cholecalciferol (D3-1000) 25 MCG (1000 UT) capsule Take 1 capsule (1,000 Units total) by mouth daily. 90 capsule 3  . folic acid (FOLVITE) 174 MCG tablet Take 400 mcg by mouth daily. (Patient not taking: Reported on 11/23/2020)    . Multiple Vitamin (MULTIVITAMIN WITH MINERALS) TABS tablet Take 1 tablet by mouth daily. 90 tablet 3  . rifaximin (XIFAXAN) 550 MG TABS tablet Take 1 tablet (550 mg total) by mouth 2 (two) times daily. (Patient not taking: No sig reported) 60 tablet 11  . spironolactone (ALDACTONE) 100 MG tablet Take 1 tablet (100 mg total) by mouth daily. 30 tablet 2  . thiamine 100 MG tablet Take 1 tablet (100 mg total) by mouth daily. 90 tablet 1   No current facility-administered medications for this visit.    Allergies as of 11/23/2020  . (No Known Allergies)    Family History  Problem Relation Age of Onset  . Cancer Mother        ? etiology  . Heart disease Mother   . Colon  polyps Sister 63       two sisters  . Brain cancer Father   . Liver disease Neg Hx     Social History   Socioeconomic History  . Marital status: Married    Spouse name: Not on file  . Number of children: 2  . Years of education: 17  . Highest education level: High school graduate  Occupational History  . 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 Smoking status: Current Every Day Smoker  Packs/day: 1.00    Years: 30.00    Pack years: 30.00    Types: Cigarettes    Start date: 12/24/1979  . Smokeless tobacco: Never Used  Vaping Use  . Vaping Use: Never used  Substance and Sexual Activity  . Alcohol use: Not Currently    Alcohol/week: 0.0 standard drinks    Comment: 11/23/20: no ETOH 3 days; on 11/10/20: still drinking "4-5 glasses of wine a day"; denied 11/23/20  . Drug use: No  . Sexual activity: Yes    Partners: Female    Birth control/protection: None  Other Topics Concern  . Not on file  Social History Narrative   Lives at home with family.   Right-handed.   No daily use of caffeine.   Social Determinants of Health   Financial Resource Strain: Not on file  Food Insecurity: Not on file  Transportation Needs: Not on file  Physical Activity: Not on file  Stress: Not on file  Social Connections: Not on file    Subjective: Review of Systems  Constitutional: Negative for chills, fever, malaise/fatigue and weight loss.  HENT: Negative for congestion and sore throat.   Respiratory: Negative for cough and shortness of breath.   Cardiovascular: Positive for leg swelling. Negative for chest pain and palpitations.  Gastrointestinal: Positive for abdominal pain and diarrhea. Negative for blood in stool, melena, nausea and vomiting.  Musculoskeletal: Negative for joint pain and myalgias.  Skin: Negative for rash.  Neurological: Negative for dizziness and weakness.  Endo/Heme/Allergies: Does not bruise/bleed easily.   Psychiatric/Behavioral: Negative for depression. The patient is not nervous/anxious.   All other systems reviewed and are negative.    Objective: BP 122/70   Pulse 75   Temp (!) 96.6 F (35.9 C) (Temporal)   Ht 6' 1"  (1.854 m)   Wt 288 lb 12.8 oz (131 kg)   BMI 38.10 kg/m  Physical Exam Vitals and nursing note reviewed.  Constitutional:      General: He is not in acute distress.    Appearance: Normal appearance. He is obese. He is ill-appearing. He is not toxic-appearing or diaphoretic.     Comments: Appears fatigued  HENT:     Head: Normocephalic and atraumatic.     Nose: No congestion or rhinorrhea.  Eyes:     General: No scleral icterus. Cardiovascular:     Rate and Rhythm: Normal rate and regular rhythm.     Heart sounds: Normal heart sounds.  Pulmonary:     Effort: Pulmonary effort is normal.     Breath sounds: Normal breath sounds.  Abdominal:     General: Bowel sounds are normal. There is distension.     Palpations: Abdomen is soft. There is shifting dullness and fluid wave. There is no hepatomegaly, splenomegaly or mass.     Tenderness: There is no abdominal tenderness. There is no guarding or rebound.     Hernia: No hernia is present.  Musculoskeletal:     Cervical back: Neck supple.     Right lower leg: 3+ Pitting Edema present.     Left lower leg: 3+ Pitting Edema present.  Skin:    General: Skin is warm and dry.     Coloration: Skin is not jaundiced.     Findings: No bruising or rash.  Neurological:     General: No focal deficit present.     Mental Status: He is alert and oriented to person, place, and time. Mental status is at baseline.  Psychiatric:  Mood and Affect: Mood normal.        Behavior: Behavior normal.        Thought Content: Thought content normal.      Assessment:  54 year old male presents for hospitalization follow-up.  Extensive GI history with alcohol abuse and alcoholism, alcoholic cirrhosis with portal hypertension and  intermittent acute hepatic encephalopathy.  He has had multiple episodes of, at times life-threatening, GI bleed.  History of noncompliance with medications and recommendations leading to worsening recurrence of his cirrhosis complications.  Recently and a rehab center in [place] but still drinking 5 to 6 glasses of wine a day.  He previously was drinking two fifths of liquor a day.  After he was admitted to the hospital he admitted up to 10 cocktails a day.  Unsure of exactly how much he is drinking.  He had a significant gastric varix bleed status post intubation, hypovolemic shock for which she was taken to interventional radiology and had reobliteration of a gastrorenal shunt.  He subsequently did well after this, was extubated, and eventually discharged to home.  Alcoholic cirrhosis: Poor prognosis given his continued drinking.  He states he has not had any alcohol since discharge (somewhere between 48 to 72 hours).  He is hoping to get back to work around January 3, although I am not sure that this is a feasible/realistic goal.  We just saw him and our office couple weeks ago.  Labs and imaging up-to-date.  He was having worsening lower extremity edema.  Currently on Lasix 40 mg daily, not currently on Aldactone.  Most recent kidney function normal, potassium normal. At this point he was reeducated on protein intake to help with edema including protein of 1.3 g/kg/day.  I recommended he start Aldactone 100 mg daily to get him up to Lasix/Aldactone 40 mg / 100 mg per standard cirrhosis dosing.  Hepatic encephalopathy: Longstanding history of noncompliance with medications including lactulose.  Has attempted to obtain Xifaxan but is awaiting for a prior authorization.  He is taking lactulose with goal of 3 stools a day.  I recommended he continue this, we will work on prior authorization for [employer].  Call for worsening confusion.  We will likely need to hold off on return to work/driving until we can ensure  that he is compliant with his medications and not at risk for encephalopathy.  Recent acute GI bleed due to gastric varix status post embolization with IR: Today states he is still quite weak.  His wife had to help him stand from a chair in the exam room.  He is wanting to return to work but his work functions include heavy lifting.  I discussed he will likely not be able to return to work for 4 to 6 weeks at a minimum until he can get the strength back in order to perform his job functions.  Additionally with intermittent hepatic encephalopathy and noncompliance of medications he is at risk for hurting himself or others at this time.  He denies any further GI bleed, stools are brown.  I recommend he call us immediately for any recurrent obvious GI bleed.  Alcoholism: No alcohol in 48 to 72 hours (since discharge from the hospital).  I recommend he continue with alcohol cessation.  We had a very frank discussion, yet again, that if he continues to drink he will likely not live very long.  If he continues drinking overall very poor prognosis.  He verbalized understanding as did his wife.  I recommended he  get established with an alcohol cessation counselor soon as possible to help with his ongoing alcohol cessation.   Plan: 1. Protein intake goal of 1.3 g/kg/day for lower extremity edema and ascites prevention 2. Add Aldactone 100 mg to his diuretic regimen 3. Continue nadolol 4. Work on [employer] prior authorization 5. Alcohol cessation 6. Seen alcohol cessation counselor 7. It is imperative to have medication compliance to prevent further complications 8. Call for any problems 9. Follow-up in 4 to 6 weeks.    Thank you for allowing [place] to participate in the care of Donnelly Angelica, DNP, AGNP-C Adult & Gerontological Nurse Practitioner [facility]   11/30/2020 2:02 PM   Disclaimer: This note was dictated with voice recognition software. Similar  sounding words can inadvertently be transcribed and may not be corrected upon review.

## 2020-11-23 NOTE — Telephone Encounter (Signed)
Paperwork done and given to [place].

## 2020-11-23 NOTE — Telephone Encounter (Signed)
Patient dropped off fmla paperwork

## 2020-11-24 ENCOUNTER — Telehealth: Payer: Self-pay

## 2020-11-24 NOTE — Telephone Encounter (Signed)
PA was submitted for Xifaxan 550 mg. Received approval letter today 11/24/2020. Approval is good through 11/24/2021. Left a detailed message of approval for pt. Approval letter will be scanned in pts chart.

## 2020-11-30 ENCOUNTER — Encounter: Payer: Self-pay | Admitting: Nurse Practitioner

## 2020-12-01 ENCOUNTER — Telehealth: Payer: Self-pay | Admitting: Family Medicine

## 2020-12-01 NOTE — Telephone Encounter (Signed)
Patient had the [employer] fax over forms to be completed in your box. Patient called this morning needing as soon as possible

## 2020-12-05 NOTE — Telephone Encounter (Signed)
Gave paper to [place], pls call pt to make sure return date of 01/02/21, works, this is 6 wks from the date of his hospital discharge, and recommendations from GI.   Thx.   Dr. Lovena Le

## 2020-12-24 ENCOUNTER — Other Ambulatory Visit: Payer: Self-pay | Admitting: Family Medicine

## 2020-12-27 ENCOUNTER — Other Ambulatory Visit: Payer: Self-pay | Admitting: *Deleted

## 2020-12-27 NOTE — Telephone Encounter (Signed)
11/21/20 was last visit

## 2021-01-11 ENCOUNTER — Ambulatory Visit (INDEPENDENT_AMBULATORY_CARE_PROVIDER_SITE_OTHER): Payer: Managed Care, Other (non HMO) | Admitting: Nurse Practitioner

## 2021-01-11 ENCOUNTER — Encounter: Payer: Self-pay | Admitting: Nurse Practitioner

## 2021-01-11 ENCOUNTER — Other Ambulatory Visit: Payer: Self-pay

## 2021-01-11 VITALS — BP 124/78 | HR 78 | Temp 97.1°F | Ht 73.0 in | Wt 266.8 lb

## 2021-01-11 DIAGNOSIS — K922 Gastrointestinal hemorrhage, unspecified: Secondary | ICD-10-CM

## 2021-01-11 DIAGNOSIS — K7682 Hepatic encephalopathy: Secondary | ICD-10-CM

## 2021-01-11 DIAGNOSIS — K7200 Acute and subacute hepatic failure without coma: Secondary | ICD-10-CM

## 2021-01-11 DIAGNOSIS — I864 Gastric varices: Secondary | ICD-10-CM | POA: Diagnosis not present

## 2021-01-11 DIAGNOSIS — K7031 Alcoholic cirrhosis of liver with ascites: Secondary | ICD-10-CM

## 2021-01-11 DIAGNOSIS — G621 Alcoholic polyneuropathy: Secondary | ICD-10-CM

## 2021-01-11 DIAGNOSIS — K766 Portal hypertension: Secondary | ICD-10-CM | POA: Diagnosis not present

## 2021-01-11 DIAGNOSIS — K219 Gastro-esophageal reflux disease without esophagitis: Secondary | ICD-10-CM | POA: Diagnosis not present

## 2021-01-11 DIAGNOSIS — F1010 Alcohol abuse, uncomplicated: Secondary | ICD-10-CM

## 2021-01-11 DIAGNOSIS — F1029 Alcohol dependence with unspecified alcohol-induced disorder: Secondary | ICD-10-CM

## 2021-01-11 NOTE — Patient Instructions (Signed)
Your health issues we discussed today were:   Cirrhosis with complications: 1. Have your labs completed when you are able to 2. Make sure you are taking lactulose 2-3 times a day.  Your goal should be for 3-4 soft bowel movements a day 3. Ensure that you are taking Xifaxan 550 mg twice a day 4. Continue your other medications including nadolol 5. There is no need to update your upper endoscopy at this time 6. As always, we recommend strict alcohol cessation.  If you continue to drink your liver will continue to decline leading to inevitable death 7. You are okay to return to work.  I requested restrictions such as no lifting greater than 25 pounds, frequent rest breaks as needed, only "occasional" driving 8. Call us for any worsening or significant symptoms  Overall I recommend:  1. Continue other current medications 2. Return for follow-up 3 months 3. Call us if you have any questions or concerns   ---------------------------------------------------------------  I am glad you have gotten your COVID-19 vaccination!  Even though you are fully vaccinated you should continue to follow CDC and state/local guidelines.  ---------------------------------------------------------------   At [facility] we value your feedback. You may receive a survey about your visit today. Please share your experience as we strive to create trusting relationships with our patients to provide genuine, compassionate, quality care.  We appreciate your understanding and patience as we review any laboratory studies, imaging, and other diagnostic tests that are ordered as we care for you. Our office policy is 5 business days for review of these results, and any emergent or urgent results are addressed in a timely manner for your best interest. If you do not hear from our office in 1 week, please contact us.   We also encourage the use of MyChart, which contains your medical information for your review  as well. If you are not enrolled in this feature, an access code is on this after visit summary for your convenience. Thank you for allowing [place] to be involved in your care.  It was great to see you today!  I hope you have a safe and warm winter!!

## 2021-01-11 NOTE — Progress Notes (Signed)
Referring Provider: Erven Colla, DO Primary Care Physician:  Erven Colla, DO Primary GI:  Dr. Gala Romney  Chief Complaint  Patient presents with  . Cirrhosis    Doing ok    HPI:   Bradley Miles is a 55 y.o. male who presents for follow-up on alcoholic cirrhosis.  The patient was last seen in our office 11/23/2020 for the same as well as acute GI bleed, hematemesis, portal hypertension, gastric varices, bleeding esophageal varices, acute acute respiratory failure, alcohol dependence, lower abdominal pain.  Known history of alcoholic cirrhosis, chronic alcohol use and sequela such as portal hypertension acute hepatic encephalopathy.  Lactulose previously helped with mental status.  Waxing and waning alcohol dependence.  Previous GI care and complications:  He has had hospitalizations related to complications from cirrhosis. At the end of 2020 he was admitted and admitted drinking fifthof liquor daily. During this hospitalization he developed a large volume hematemesis for which required transfer to [facility] and ICU stay for CTA and IR consult. He underwent a TIPS procedure with embolization of the gastric variceal supply. During his hospitalization he received a total of 11 units of blood, 3 units of FFP, and 1 dose of vitamin K. Now status post BRTOon 11/21/2020 and tips with embolization of 11/23/2020.  The patient was again admitted to the hospital from 08/04/2020 03/08/2020 for hepatic encephalopathy, and acute GI bleed. Noted hematemesis and melena CTA with no active bleed and hemoglobin downward trend from 12.6 on admission to 10.6 at discharge.   He was treated with PPI, IV octreotide, and discharged home on oral Dexilant. INR elevated 1.7 treated with vitamin K which had a resulting decline to 1.3. EGD completed without active bleeding noted. Also noted hepatic encephalopathy with ammonia elevated at 121 and likely due to noncompliance with lactulose and rifaximin.  Also noncompliant with nadolol incidentally. He was treated with IV Rocephin for SBP prophylaxis and discharged on Omnicef. During his hospitalization he admitted drinking heavily up to two fifthsof liquor a day and he was subsequently placed on CIWA protocol during his hospitalization.  At his last visit admitted to still drinking 4-5 servings (5 ounces) of wine a day though he does have "skip days".  He went to detox in September and no longer drinking two fifths of liquor daily.  Admitted nausea and vomiting because "I took lactulose at the same time as eating" although his wife pointed out that he was also drinking alcohol at that time which he did admit.  Continued noncompliance with appropriate lactulose dosing only taking once a day with one bowel movement daily.  EGD noted up to date.  We revisited counseling on medication compliance, lactulose dosing, alcohol avoidance, low-sodium diet.  Recommended lactulose for 3-4 soft bowel movements a day, contact primary care to refill antidepressants, update labs including CBC, CMP, INR, AFP as well as right upper quadrant ultrasound for hepatoma screening/surveillance and follow-up in 6 months.  The patient was again admitted to the hospital at [facility] from 11/15/2020 through 11/20/2020 for recurrent life-threatening bleed.  He was admitted with decompensated alcoholic cirrhosis with ongoing alcohol abuse and recurrent upper GI bleed with large volume hematemesis.  He presented with acute hematemesis and black tarry stool as well as confusion after drinking alcohol again.  He required intubation for airway protection.  EGD demonstrated recurrent gastric varices but unable to locate source of bleed due to significant amount of blood and clot in the stomach.  He was appropriately  treated with IV Rocephin for SBP prophylaxis in the setting of GI bleed and an alcoholic cirrhotic.  He was again encouraged to quit drinking alcohol.  Noted noncompliance with  lactulose and other medications and he was again encouraged to be compliant with his medications to prevent complications.  Unfortunately, nursing notes during his hospital stay indicates continued noncompliance.  He underwent interventional radiology procedure on 11/15/2020 including ultrasound and fluoroscopic guided central line placement, retrograde cannulation of the gastric renal shunt, embolization of posterior bifurcation of the gastrorenal shunt, and balloon and coil assisted retrograde transvenous obliteration of gastric varices due to recurrent life-threatening liver related GI bleed.  Last Visit  At his last visit noted his workplace closed for the holidays and hoping to return to work in early January 2022.  Still with weakness.  His job requires heavy lifting and he had a discussion about safety.  No further melena or hematochezia.  No further nausea or vomiting.  Intermittent bilateral lower quadrant/side pain and swelling.  Was told he would need a prior authorization for Xifaxan.  Is asking for scription for supplements to help prevent hepatic encephalopathy as well as a prescription for a multivitamin.  States he has not had any alcohol since discharge (3 days prior).  He is taking lactulose for 3-4 stools a day.  Although his wife noted he will not take it if he has to leave the house.  He is taking nadolol.  Main complaints are swelling, fatigue, weakness.  No other overt GI complaints.  Recommended protein intake goal of 1.3 g/kg/day for lower extremity edema and ascites prevention, add Aldactone 100 mg to diuretics, continue nadolol, work on [employer] daily, alcohol cessation, see alcohol cessation counselor, stressed the importance of medication compliance, follow-up in 4 to 6 weeks.  Recommended possible to return to work no earlier than 4 to 6 weeks due to job functions.  They dropped off FMLA paperwork which was completed for him.  Prior authorization was completed and approved for  Xifaxan 550 mg.  Today he states he doing okay overall. States his weakness is significantly improved. Feels he's back to near-normal strength. "I can do a lot in short times" but does need to sit down now and then. Denies abdominal pain, N/V, hematochezia, melena, fever, chills, unintentional weight loss. Denies any yellowing of the skin/eyes, darkened urine, generalized tremors, generalized pruritis. States his wife thinks he has occasional confusion. He is not taking lactulose. He felt he was having normal bowel movements and didn't need it. Discussed it's mechanism and the need to take it. Goal: 3-4 soft bowel movements a day. He stopped taking Lasix because "all my swelling is gone". He has lost a net of 40 lbs since hosptial discharge (in fluid, per the patient). He is taking Nadolol. GERD doing well on PPI. He thinks he is taking Xifaxan, but not sure. Denies URI or flu-like symptoms. Denies loss of sense of taste or smell. The patient has received COVID-19 vaccination(s). Denies chest pain, dyspnea, dizziness, lightheadedness, syncope, near syncope. Denies any other upper or lower GI symptoms.  Up to date on EGD. He is due for updated labs. Imaging up to date (CTA Abd/pelvis 11/15/20). Drinks 4-5 times a week, 24 oz malt beverage a day.  He hasn't seen an alcohol cessation counselor recently. He did see a nerve pain clinic who told him he has significant LE nerve damage. They recommended treatment, but needs to abstain from ETOH first.  Past Medical History:  Diagnosis Date  .  Anxiety   . Controlled type 2 diabetes mellitus with complication, without long-term current use of insulin ([place]) 06/26/2020  . Enlarged liver   . GERD (gastroesophageal reflux disease)   . Hypertension   . Palpitations   . PVC's (premature ventricular contractions)   . Shortness of breath   . Tussive syncope 12/22/2019  . Varicose veins     Past Surgical History:  Procedure Laterality Date  . BIOPSY  02/02/2019    Procedure: BIOPSY;  Surgeon: Daneil Dolin, MD;  [facility];  Service: Endoscopy;;  gastric  . COLONOSCOPY WITH ESOPHAGOGASTRODUODENOSCOPY (EGD)  12/16/2012   internal hemorrhoids, colonic diverticulosis, benign polyps, screening in 2024. EGD with mild erosive reflux esophagitis, small hiatal hernia, negative H.pylori  . cyst reomved     from spine  . ESOPHAGOGASTRODUODENOSCOPY  05/19/09   RMR: Geographic distal esophageal erosions consistent with severe erosive reflux esophagitis. schatzki's ring s/P dilation/small hiatal hernia otherwise normal stomach  . ESOPHAGOGASTRODUODENOSCOPY (EGD) WITH PROPOFOL N/A 02/02/2019   Dr. Gala Romney: retained gastric contents. portal HTN gastropathy  . ESOPHAGOGASTRODUODENOSCOPY (EGD) WITH PROPOFOL N/A 11/20/2019   Procedure: ESOPHAGOGASTRODUODENOSCOPY (EGD) WITH PROPOFOL;  Surgeon: Daneil Dolin, MD;  [facility];  Service: Endoscopy;  Laterality: N/A;  . ESOPHAGOGASTRODUODENOSCOPY (EGD) WITH PROPOFOL N/A 11/24/2019   Procedure: ESOPHAGOGASTRODUODENOSCOPY (EGD) WITH PROPOFOL;  Surgeon: Ronnette Juniper, MD;  Location: [place];  Service: Gastroenterology;  Laterality: N/A;  . ESOPHAGOGASTRODUODENOSCOPY (EGD) WITH PROPOFOL N/A 08/08/2020   Procedure: ESOPHAGOGASTRODUODENOSCOPY (EGD) WITH PROPOFOL;  Surgeon: Daneil Dolin, MD;  [facility];  Service: Endoscopy;  Laterality: N/A;  . ESOPHAGOGASTRODUODENOSCOPY (EGD) WITH PROPOFOL N/A 11/15/2020   Procedure: ESOPHAGOGASTRODUODENOSCOPY (EGD) WITH PROPOFOL;  Surgeon: Ladene Artist, MD;  Location[facility];  Service: Endoscopy;  Laterality: N/A;  . IR ANGIOGRAM SELECTIVE EACH ADDITIONAL VESSEL  11/22/2019  . IR ANGIOGRAM SELECTIVE EACH ADDITIONAL VESSEL  11/24/2019  . IR ANGIOGRAM SELECTIVE EACH ADDITIONAL VESSEL  11/24/2019  . IR ANGIOGRAM SELECTIVE EACH ADDITIONAL VESSEL  11/24/2019  . IR ANGIOGRAM SELECTIVE EACH ADDITIONAL VESSEL  11/15/2020  . IR EMBO ART  VEN HEMORR LYMPH  EXTRAV  INC GUIDE ROADMAPPING  11/22/2019  . IR EMBO ART  VEN HEMORR LYMPH EXTRAV  INC GUIDE ROADMAPPING  11/24/2019  . IR EMBO ART  VEN HEMORR LYMPH EXTRAV  INC GUIDE ROADMAPPING  11/15/2020  . IR FLUORO GUIDE CV LINE RIGHT  11/15/2020  . IR RADIOLOGIST EVAL & MGMT  12/29/2019  . IR RADIOLOGIST EVAL & MGMT  10/27/2020  . IR TIPS  11/24/2019  . IR US GUIDE VASC ACCESS RIGHT  11/22/2019  . IR US GUIDE VASC ACCESS RIGHT  11/15/2020  . IR US GUIDE VASC ACCESS RIGHT  11/15/2020  . IR VENOGRAM RENAL UNI LEFT  11/22/2019  . IR VENOGRAM RENAL UNI LEFT  11/15/2020  . RADIOLOGY WITH ANESTHESIA N/A 11/24/2019   Procedure: IR WITH ANESTHESIA;  Surgeon: Radiologist, Medication, MD;  Location: [place];  Service: Radiology;  Laterality: N/A;  . RADIOLOGY WITH ANESTHESIA N/A 11/15/2020   Procedure: IR WITH ANESTHESIA;  Surgeon: Suzette Battiest, MD;  Location: [place];  Service: Radiology;  Laterality: N/A;  . TIPS PROCEDURE N/A 11/22/2019   Procedure: BRTO/TRANS-JUGULAR INTRAHEPATIC PORTAL SHUNT (TIPS);  Surgeon: Corrie Mckusick, DO;  Location: [place];  Service: Anesthesiology;  Laterality: N/A;  . WISDOM TOOTH EXTRACTION      Current Outpatient Medications  Medication Sig Dispense Refill  . allopurinol (ZYLOPRIM) 100 MG tablet  TAKE 1 TABLET BY MOUTH EVERY DAY (Patient taking differently: Take 100 mg by mouth daily.) 30 tablet 5  . blood glucose meter kit and supplies Dispense based on patient and insurance preference. Test blood sugar once daily.  (FOR ICD-10 E10.9, E11.9). 1 each 5  . Cholecalciferol (D3-1000) 25 MCG (1000 UT) capsule Take 1 capsule (1,000 Units total) by mouth daily. 90 capsule 3  . dexlansoprazole (DEXILANT) 60 MG capsule Take 1 capsule (60 mg total) by mouth daily. 30 capsule 3  . ferrous sulfate 325 (65 FE) MG EC tablet Take 1 tablet (325 mg total) by mouth 2 (two) times daily. 60 tablet 3  . folic acid (FOLVITE) 100 MCG tablet Take 400 mcg by mouth daily.    Marland Kitchen gabapentin (NEURONTIN) 300 MG  capsule Take 1 capsule (300 mg total) by mouth 3 (three) times daily. 90 capsule 1  . Multiple Vitamin (MULTIVITAMIN WITH MINERALS) TABS tablet Take 1 tablet by mouth daily. 90 tablet 3  . nadolol (CORGARD) 40 MG tablet Take 1.5 tablets (60 mg total) by mouth daily. 135 tablet 3  . ondansetron (ZOFRAN-ODT) 4 MG disintegrating tablet DISSOLVE 1 TABLET ON THE TONGUE EVERY 8 HOURS AS NEEDED FOR NAUSEA 24 tablet 2  . PARoxetine (PAXIL) 40 MG tablet Take 1 tablet (40 mg total) by mouth daily. 90 tablet 1  . rifaximin (XIFAXAN) 550 MG TABS tablet Take 1 tablet (550 mg total) by mouth 2 (two) times daily. 60 tablet 11  . sildenafil (VIAGRA) 100 MG tablet TAKE 1/2 TABLET BY MOUTH EVERY DAY (Patient taking differently: Take 50 mg by mouth as needed for erectile dysfunction.) 3 tablet 3  . sitaGLIPtin (JANUVIA) 50 MG tablet Take 1 tablet (50 mg total) by mouth daily. (Patient taking differently: Take 100 mg by mouth daily.) 90 tablet 1  . sucralfate (CARAFATE) 1 g tablet TAKE 1 TABLET BY MOUTH BEFORE MEALS 90 tablet 1  . thiamine 100 MG tablet Take 1 tablet (100 mg total) by mouth daily. 90 tablet 1  . traZODone (DESYREL) 50 MG tablet TAKE 1 TABLET BY MOUTH AT BEDTIME AS NEEDED FOR INSOMNIA 30 tablet 2  . lactulose, encephalopathy, (CHRONULAC) 10 GM/15ML SOLN Take 15-30cc 1-2 daily to Titrate for 3-4 soft bowel movements a day (Patient not taking: Reported on 01/11/2021) 1892 mL 11  . potassium chloride SA (KLOR-CON) 20 MEQ tablet Take 1 tablet (20 mEq total) by mouth daily as needed (Take when taking Lasix). (Patient not taking: Reported on 01/11/2021) 30 tablet 11   No current facility-administered medications for this visit.    Allergies as of 01/11/2021  . (No Known Allergies)    Family History  Problem Relation Age of Onset  . Cancer Mother        ? etiology  . Heart disease Mother   . Colon polyps Sister 107       two sisters  . Brain cancer Father   . Liver disease Neg Hx     Social History    Socioeconomic History  . Marital status: Married    Spouse name: Not on file  . Number of children: 2  . Years of education: 22  . Highest education level: High school graduate  Occupational History  . 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 Smoking status: Current Every Day Smoker    Packs/day: 1.00    Years: 30.00    Pack years: 30.00    Types: Cigarettes  Start date: 12/24/1979  . Smokeless tobacco: Never Used  Vaping Use  . Vaping Use: Never used  Substance and Sexual Activity  . Alcohol use: Yes    Alcohol/week: 0.0 standard drinks    Comment: 01/11/21: 24 oz malt beverage 5 times a week; 11/23/20: no ETOH 3 days; on 11/10/20: still drinking "4-5 glasses of wine a day"; denied 11/23/20; 01/11/21 "little 4-5 days/week  . Drug use: No  . Sexual activity: Yes    Partners: Female    Birth control/protection: None  Other Topics Concern  . Not on file  Social History Narrative   Lives at home with family.   Right-handed.   No daily use of caffeine.   Social Determinants of Health   Financial Resource Strain: Not on file  Food Insecurity: Not on file  Transportation Needs: Not on file  Physical Activity: Not on file  Stress: Not on file  Social Connections: Not on file    Subjective: Review of Systems  Constitutional: Negative for chills, fever, malaise/fatigue and weight loss.  HENT: Negative for congestion and sore throat.   Respiratory: Negative for cough and shortness of breath.   Cardiovascular: Negative for chest pain and palpitations.  Gastrointestinal: Negative for abdominal pain, blood in stool, constipation, diarrhea, heartburn, melena, nausea and vomiting.  Musculoskeletal: Negative for joint pain and myalgias.  Skin: Negative for rash.  Neurological: Negative for dizziness and weakness.  Endo/Heme/Allergies: Does not bruise/bleed easily.  Psychiatric/Behavioral: Negative for depression. The patient is not  nervous/anxious.   All other systems reviewed and are negative.    Objective: BP 124/78   Pulse 78   Temp (!) 97.1 F (36.2 C) (Temporal)   Ht 6' 1"  (1.854 m)   Wt 266 lb 12.8 oz (121 kg)   BMI 35.20 kg/m  Physical Exam Vitals and nursing note reviewed.  Constitutional:      General: He is not in acute distress.    Appearance: Normal appearance. He is obese. He is not ill-appearing, toxic-appearing or diaphoretic.  HENT:     Head: Normocephalic and atraumatic.     Nose: No congestion or rhinorrhea.  Eyes:     General: No scleral icterus. Cardiovascular:     Rate and Rhythm: Normal rate and regular rhythm.     Heart sounds: Normal heart sounds.     Comments: Puffy LE, no pitting edema Pulmonary:     Effort: Pulmonary effort is normal.     Breath sounds: Normal breath sounds.  Abdominal:     General: Bowel sounds are normal. There is no distension.     Palpations: Abdomen is soft. There is no hepatomegaly, splenomegaly or mass.     Tenderness: There is no abdominal tenderness. There is no guarding or rebound.     Hernia: No hernia is present.  Musculoskeletal:     Cervical back: Neck supple.     Right lower leg: Edema present.     Left lower leg: Edema present.  Skin:    General: Skin is warm and dry.     Coloration: Skin is not jaundiced.     Findings: No bruising or rash.  Neurological:     General: No focal deficit present.     Mental Status: He is alert and oriented to person, place, and time. Mental status is at baseline.  Psychiatric:        Mood and Affect: Mood normal.        Behavior: Behavior normal.  Thought Content: Thought content normal.      Assessment:  54 year old male with alcoholic cirrhosis and multiple liver complications with multiple admissions, near death experiences, intubations from encephalopathy, significant bleeding requiring multiple units of blood.  This is all outlined in HPI.  He presents today for follow-up.  He has been  out of work due to his most recent hospitalization and subsequent weakness from his anemia.  No red flag/warning signs or symptoms currently.  Most concerning is that he is drinking again.  He is also stopped taking lactulose and is not sure if he is on Xifaxan.  He is taking nadolol.  He stopped his Lasix and Aldactone because he is not having any more swelling.  Cirrhosis with multiple complications: At this point we will update his labs which trend his liver function  will allow [place] toas well as check on his anemia.  I recommended highly that he restart lactulose, ensure he is taking Xifaxan.  Continue other current recommended medications as well.  Again discussed and emphasized strict alcohol cessation.  At this point his weakness has improved significantly he can likely return to work.  I have given some reasonable restrictions such as no weight lifting greater than 525 pounds, frequent rest breaks, only occasional driving.  He may be able to drive more at work when he has demonstrated no alcohol consumption prior to work and compliance with medications for prophylaxis of hepatic encephalopathy.  I provided a form suggest to our nursing supervisor who will complete them.  I recommended he follow-up in 3 months we can evaluate further.  He is to call us for any worsening symptoms.  If he is admitted to the hospital in the interim, we will obviously see him as an inpatient.  At this point, as we have frequently discussed with him, with ongoing drinking history overall prognosis for the long-term is poor.   Plan: 1. CBC, CMP, INR, AFP 2. Restart lactulose titrated to 3-4 soft bowel movements a day 3. Ensure you are taking Xifaxan 4. Continue taking nadolol 5. Strict alcohol cessation again recommended 6. Okay to return to work with restrictions: Frequent rest breaks, no weight greater than 25 pounds.  Occasional driving.  Forms provided to nursing supervisor to complete 7. Follow-up in 3  months 8. Call for any worsening    Thank you for allowing [place] to participate in the care of Donnelly Angelica, DNP, AGNP-C Adult & Gerontological Nurse Practitioner [facility]   01/11/2021 3:37 PM   Disclaimer: This note was dictated with voice recognition software. Similar sounding words can inadvertently be transcribed and may not be corrected upon review.

## 2021-01-12 ENCOUNTER — Encounter: Payer: Self-pay | Admitting: Internal Medicine

## 2021-01-12 ENCOUNTER — Telehealth: Payer: Self-pay | Admitting: Internal Medicine

## 2021-01-12 LAB — COMPREHENSIVE METABOLIC PANEL
AG Ratio: 0.8 (calc) — ABNORMAL LOW (ref 1.0–2.5)
ALT: 16 U/L (ref 9–46)
AST: 43 U/L — ABNORMAL HIGH (ref 10–35)
Albumin: 3 g/dL — ABNORMAL LOW (ref 3.6–5.1)
Alkaline phosphatase (APISO): 138 U/L (ref 35–144)
BUN/Creatinine Ratio: 7 (calc) (ref 6–22)
BUN: 5 mg/dL — ABNORMAL LOW (ref 7–25)
CO2: 26 mmol/L (ref 20–32)
Calcium: 8.9 mg/dL (ref 8.6–10.3)
Chloride: 102 mmol/L (ref 98–110)
Creat: 0.72 mg/dL (ref 0.70–1.33)
Globulin: 3.7 g/dL (calc) (ref 1.9–3.7)
Glucose, Bld: 133 mg/dL (ref 65–139)
Potassium: 3.9 mmol/L (ref 3.5–5.3)
Sodium: 135 mmol/L (ref 135–146)
Total Bilirubin: 1.4 mg/dL — ABNORMAL HIGH (ref 0.2–1.2)
Total Protein: 6.7 g/dL (ref 6.1–8.1)

## 2021-01-12 LAB — PROTIME-INR
INR: 1.2 — ABNORMAL HIGH
Prothrombin Time: 11.9 s — ABNORMAL HIGH (ref 9.0–11.5)

## 2021-01-12 LAB — CBC WITH DIFFERENTIAL/PLATELET
Absolute Monocytes: 686 cells/uL (ref 200–950)
Basophils Absolute: 39 cells/uL (ref 0–200)
Basophils Relative: 0.8 %
Eosinophils Absolute: 221 cells/uL (ref 15–500)
Eosinophils Relative: 4.5 %
HCT: 40.8 % (ref 38.5–50.0)
Hemoglobin: 14.1 g/dL (ref 13.2–17.1)
Lymphs Abs: 867 cells/uL (ref 850–3900)
MCH: 34.3 pg — ABNORMAL HIGH (ref 27.0–33.0)
MCHC: 34.6 g/dL (ref 32.0–36.0)
MCV: 99.3 fL (ref 80.0–100.0)
MPV: 11.5 fL (ref 7.5–12.5)
Monocytes Relative: 14 %
Neutro Abs: 3087 cells/uL (ref 1500–7800)
Neutrophils Relative %: 63 %
Platelets: 79 10*3/uL — ABNORMAL LOW (ref 140–400)
RBC: 4.11 10*6/uL — ABNORMAL LOW (ref 4.20–5.80)
RDW: 15.1 % — ABNORMAL HIGH (ref 11.0–15.0)
Total Lymphocyte: 17.7 %
WBC: 4.9 10*3/uL (ref 3.8–10.8)

## 2021-01-12 LAB — AFP TUMOR MARKER: AFP-Tumor Marker: 12.5 ng/mL — ABNORMAL HIGH (ref <6.1)

## 2021-01-12 NOTE — Telephone Encounter (Signed)
Letter done and placed on Alicia's desk.

## 2021-01-12 NOTE — Telephone Encounter (Signed)
Noted. Pts spouse is aware that letter is ready for pickup.

## 2021-01-12 NOTE — Telephone Encounter (Signed)
For the record, no process has changed. We completed the forms that the patient provided [place], and asked [place] to complete for him to go back to work. We usually have a turn-around time of 1 week. However, we completed this within 4 hours to try and help them out. If they wanted a letter, he or she should have asked for one.  We can complete a letter to return to work 01/14/21, with the same restrictions as given on the FMLA paperwork (frequent rest breaks as needed, no more than 25 lbs lifting, etc). Please see the FMLA form for restriction details.  Cc: Almyra Free to assist

## 2021-01-12 NOTE — Telephone Encounter (Signed)
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

## 2021-01-12 NOTE — Telephone Encounter (Signed)
Spoke with spouse and she is demanding a separate letter from Bradley Miles that will go to pts place of employment. Spouse and pt are aware that paperwork was faxed for his short term disability. Spouse asked why has this process changed and they want this issue resolved so he can work Saturday.

## 2021-01-23 ENCOUNTER — Other Ambulatory Visit: Payer: Self-pay | Admitting: Student

## 2021-01-23 ENCOUNTER — Other Ambulatory Visit: Payer: Self-pay | Admitting: Nurse Practitioner

## 2021-01-23 DIAGNOSIS — R772 Abnormality of alphafetoprotein: Secondary | ICD-10-CM

## 2021-01-23 DIAGNOSIS — K7031 Alcoholic cirrhosis of liver with ascites: Secondary | ICD-10-CM

## 2021-02-08 ENCOUNTER — Other Ambulatory Visit: Payer: Self-pay

## 2021-02-08 ENCOUNTER — Ambulatory Visit (HOSPITAL_COMMUNITY)
Admission: RE | Admit: 2021-02-08 | Discharge: 2021-02-08 | Disposition: A | Discharge status: Home / Self Care | Payer: Managed Care, Other (non HMO) | Source: Ambulatory Visit | Attending: Nurse Practitioner | Admitting: Nurse Practitioner

## 2021-02-08 DIAGNOSIS — K7031 Alcoholic cirrhosis of liver with ascites: Secondary | ICD-10-CM | POA: Diagnosis present

## 2021-02-08 DIAGNOSIS — R772 Abnormality of alphafetoprotein: Secondary | ICD-10-CM | POA: Diagnosis present

## 2021-02-08 MED ORDER — GADOBUTROL 1 MMOL/ML IV SOLN
10.0000 mL | Freq: Once | INTRAVENOUS | Status: AC | PRN
  Administered 2021-02-08: 10 mL via INTRAVENOUS

## 2021-02-13 ENCOUNTER — Telehealth: Payer: Self-pay | Admitting: Family Medicine

## 2021-02-13 ENCOUNTER — Other Ambulatory Visit: Payer: Self-pay | Admitting: Family Medicine

## 2021-02-13 NOTE — Telephone Encounter (Signed)
Pharmacy requesting refill on Allopurinol 100 mg tablets. Take one tablet po every day. Pt last seen 11/21/20 for hospital follow up. Please advise. Thank you

## 2021-02-13 NOTE — Telephone Encounter (Signed)
Last seen 11/21/20

## 2021-02-14 MED ORDER — ALLOPURINOL 100 MG PO TABS: 100.0000 mg | ORAL_TABLET | Freq: Every day | ORAL | 1 refills | Status: DC

## 2021-02-14 NOTE — Telephone Encounter (Signed)
Yes pls give 90 days with 1 refill. Dr. Darene Lamer

## 2021-02-14 NOTE — Addendum Note (Signed)
Addended by: Vicente Males on: 02/14/2021 11:30 AM   Modules accepted: Orders

## 2021-02-14 NOTE — Telephone Encounter (Signed)
Refill sent to pharmacy.   

## 2021-03-20 ENCOUNTER — Encounter: Payer: Self-pay | Admitting: Internal Medicine

## 2021-03-24 ENCOUNTER — Other Ambulatory Visit: Payer: Self-pay | Admitting: Family Medicine

## 2021-04-18 ENCOUNTER — Encounter: Payer: Self-pay | Admitting: Family Medicine

## 2021-04-18 ENCOUNTER — Other Ambulatory Visit: Payer: Self-pay

## 2021-04-18 ENCOUNTER — Ambulatory Visit (INDEPENDENT_AMBULATORY_CARE_PROVIDER_SITE_OTHER): Payer: Managed Care, Other (non HMO) | Admitting: Family Medicine

## 2021-04-18 ENCOUNTER — Ambulatory Visit: Payer: Managed Care, Other (non HMO) | Admitting: Nurse Practitioner

## 2021-04-18 VITALS — BP 122/82 | HR 80 | Temp 97.9°F | Wt 275.4 lb

## 2021-04-18 DIAGNOSIS — T24331A Burn of third degree of right lower leg, initial encounter: Secondary | ICD-10-CM | POA: Diagnosis not present

## 2021-04-18 DIAGNOSIS — Y278XXA Contact with other hot objects, undetermined intent, initial encounter: Secondary | ICD-10-CM

## 2021-04-18 DIAGNOSIS — E118 Type 2 diabetes mellitus with unspecified complications: Secondary | ICD-10-CM

## 2021-04-18 DIAGNOSIS — T310 Burns involving less than 10% of body surface: Secondary | ICD-10-CM | POA: Diagnosis not present

## 2021-04-18 DIAGNOSIS — T300 Burn of unspecified body region, unspecified degree: Secondary | ICD-10-CM

## 2021-04-18 DIAGNOSIS — M79661 Pain in right lower leg: Secondary | ICD-10-CM | POA: Diagnosis not present

## 2021-04-18 MED ORDER — NOVOFINE AUTOCOVER PEN NEEDLE 30G X 8 MM MISC: 1.0000 | 1 refills | Status: DC | PRN

## 2021-04-18 MED ORDER — SILVER SULFADIAZINE 1 % EX CREA: TOPICAL_CREAM | Freq: Two times a day (BID) | CUTANEOUS | 1 refills | Status: DC

## 2021-04-18 MED ORDER — HYDROCODONE-ACETAMINOPHEN 5-325 MG PO TABS
1.0000 | ORAL_TABLET | Freq: Three times a day (TID) | ORAL | 0 refills | Status: DC | PRN
Start: 2021-04-18 — End: 2021-08-23

## 2021-04-18 MED ORDER — CEPHALEXIN 500 MG PO CAPS
500.0000 mg | ORAL_CAPSULE | Freq: Four times a day (QID) | ORAL | 0 refills | Status: DC
Start: 2021-04-18 — End: 2021-05-09

## 2021-04-18 MED ORDER — LEVEMIR FLEXTOUCH 100 UNIT/ML ~~LOC~~ SOPN: 10.0000 [IU] | PEN_INJECTOR | Freq: Every day | SUBCUTANEOUS | 2 refills | Status: DC

## 2021-04-18 NOTE — Patient Instructions (Addendum)
Keflex 4x per day. Elevate leg silva dene cream 2x per day with dressing changes. Tylenol or norco prn.  If seeing numbers if seeing over 200.  Take 10 units at night levimir  Check glucose 2xper day.  Keep all other daily  Meds.  Do not drink alcohol with the pain medications.    Third-Degree Burn, Adult A third-degree burn, also called a full thickness wound, is a serious injury that affects all layers of the skin. The injury can also affect the nerves, blood vessels, and muscles under the skin. A third-degree burn is a medical emergency. What are the causes? This condition may be caused by:  Heat. A heat burn (thermal burn) can happen if your skin touches something very hot, such as a flame or hot liquid.  Radiation. A radiation burn can be caused by sunlight, radiation used to heat food, and radiation treatment.  Electricity. Electrical burns can be caused by frayed wires and electrical outlets.  Chemicals that are corrosive (caustic), such as bleach. A chemical burn can happen if you inhale, swallow (ingest), or touch a caustic chemical. What are the signs or symptoms? Symptoms of this condition include:  Numbness in the wound.  Only a little pain in the wound. The area around the wound may hurt more than the burned area.  Thick, dry, or leather-like skin.  Color changes in the skin. The skin might look white, yellow, brown, purple, or black. If you are severely burned, you may go into shock. Symptoms of shock include:  Dehydration. You might feel very thirsty, dizzy, or weak, or you may have trouble urinating.  Blue color to the lips or fingernails.  Fast breathing.  Fainting. How is this diagnosed? This condition is diagnosed with an exam of the wounded area. It may take several days to determine the extent of your burn. If the wound is large, you may need to stay in the hospital so a health care team can examine the wound and watch your overall health. You may  also have tests to check your heart rate, breathing, and blood pressure. How is this treated? Treatment for this condition focuses on:  Replacing any lost fluids.  Preventing infection.  Limiting scarring. Immediate treatment Immediate treatment may include:  Oxygen to help breathing.  Giving fluids through an IV.  Bandaging burned areas.  Removing dead skin (debridement).  Antibiotic medicine to fight infection.  Pain medicine.  A blood transfusion.  A tetanus shot.  Compression bandages (dressings). Later treatment Later treatment may include:  Going to a burn center.  Breathing support. This could mean getting oxygen through a mask or using a machine called a ventilator.  Applying dressings to the wound.  Antibiotic medicine to help prevent or treat infection.  Surgery. This may be needed to: ? Remove dead tissue. ? Cover the burned area with healthy skin (skin grafts). ? Repair damaged areas.  Physical therapy. This helps you return to your regular activities. It may also help to prevent or reduce scarring.  Wearing a splint or immobilizer. Follow these instructions at home: Wound care Your health care provider may recommend taking over-the-counter or prescription pain medicine before changing your dressing. Follow instructions from your health care provider about how to take care of the burn. You may need to:  Clean or rinse (irrigate)the burned area.  Change your dressing as told.  Apply cream or ointment to the burn as told by your health care provider.  Place a dressing in the  deep areas of a burned area to absorb drainage from the burn (packing).  Place a dressing over the burn. Cleaning the wound  Wash your hands with soap and water for at least 20 seconds before and after burn care. If soap and water are not available, use hand sanitizer.  Wear clean or sterile gloves as directed by your health care provider.  Clean your burn 2-3 times a  day or as told by your health care provider. To clean your wound: ? Wash the wound with mild soap and water. ? Rinse the wound with water to remove all soap. ? Pat the wound dry with a clean towel. Do not rub it.   Preventing infection  Do not scratch or pick at the wound.  Do not break any blisters or peel any skin.  Do not rub your burn, even when you are cleaning it.  Check your wound every day for signs of infection. Check for: ? More redness, swelling, or pain. ? More fluid or blood. ? Warmth. ? Pus or a bad smell. ? Red streaks around the burn. Medicine  Take and apply over-the-counter and prescription medicines only as told by your health care provider.  If you were prescribed an antibiotic medicine or ointment, take or apply it as told by your health care provider. Do not stop using the antibiotic even if your condition improves. Eating and drinking  Eat plenty of healthy foods, including lean meats, whole grains, fruits, and vegetables. Follow the diet told to you by your health care provider or dietitian. You may need to eat a diet that is high in protein.  Drink enough fluid to keep your urine pale yellow.   Activity  Rest as told by your health care provider.  Follow the home exercise plan that was developed for you.  Do range-of-motion movements as told by your physical therapist. Finding support  Consider joining a support group. If there is not a group near you, consider an online support group.  Consider asking a friend or caregiver to help with errands such as grocery shopping, cleaning, and driving you to your appointments.  Contact your local home care agencies, community agencies, and social agencies for help.  Consider talking to a mental health professional or therapist if needed. General instructions  Do not take baths, swim, or use a hot tub until your health care provider approves. Ask your health care provider if you may take showers. You may  only be allowed to take sponge baths.  Do not use any products that contain nicotine or tobacco, such as cigarettes, e-cigarettes, and chewing tobacco. These can delay healing. If you need help quitting, ask your health care provider.  Raise (elevate) the injured area above the level of your heart while you are sitting or lying down.  Keep the affected area clean and follow any wound care instructions provided to you. Protect your wound from the sun.  If you have a compression dressing or immobilizer, wear it as told by your health care provider.  Keep all follow-up visits as told by your health care provider. This is important. Contact a health care provider if:  Your symptoms do not improve with treatment.  Your pain is not controlled with medicine.  You have more redness, swelling, or pain around your burn.  You have more fluid or blood coming from your wound.  Your burn feels warm to the touch.  You have pus or a bad smell coming from the burned  area. Get help right away if you:  Have a fever or chills.  Develop red streaks near the burn that feel warm to the touch.  Develop severe pain.  Have trouble urinating.  Are more thirsty than normal.  Have trouble breathing or are short of breath.  Have a rapid heart rate.  Are confused, or you lose a sense of time, place, or who you are (become disoriented). Summary  A third-degree burn, also called a full thickness wound, is a serious injury that affects all layers of the skin.  This condition may be caused by heat, radiation, electricity, or corrosive (caustic) chemicals.  Treatment for this condition focuses on replacing any lost fluids, preventing infection, and limiting scarring.  Check your wound every day for signs of infection.  Do not take baths, swim, or use a hot tub until your health care provider approves. This information is not intended to replace advice given to you by your health care provider. Make  sure you discuss any questions you have with your health care provider. Document Revised: 10/08/2019 Document Reviewed: 10/08/2019 Elsevier Patient Education  [place].
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
## 2021-04-19 ENCOUNTER — Encounter: Payer: Self-pay | Admitting: *Deleted

## 2021-04-19 ENCOUNTER — Telehealth: Payer: Self-pay | Admitting: Family Medicine

## 2021-04-19 ENCOUNTER — Ambulatory Visit
Admission: RE | Admit: 2021-04-19 | Discharge: 2021-04-19 | Disposition: A | Discharge status: Home / Self Care | Payer: Managed Care, Other (non HMO) | Source: Ambulatory Visit | Attending: Radiology | Admitting: Radiology

## 2021-04-19 DIAGNOSIS — I8501 Esophageal varices with bleeding: Secondary | ICD-10-CM

## 2021-04-19 HISTORY — PX: IR RADIOLOGIST EVAL & MGMT: IMG5224

## 2021-04-19 MED ORDER — SILDENAFIL CITRATE 100 MG PO TABS
50.0000 mg | ORAL_TABLET | ORAL | 2 refills | Status: DC | PRN
Start: 2021-04-19 — End: 2022-08-23
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
## 2021-04-19 NOTE — Addendum Note (Signed)
Addended by: Erven Colla on: 04/19/2021 03:14 PM   Modules accepted: Orders

## 2021-04-23 ENCOUNTER — Other Ambulatory Visit: Payer: Self-pay | Admitting: Family Medicine

## 2021-04-25 ENCOUNTER — Ambulatory Visit: Payer: Managed Care, Other (non HMO) | Admitting: Family Medicine

## 2021-04-27 ENCOUNTER — Telehealth: Payer: Self-pay | Admitting: Family Medicine

## 2021-04-27 NOTE — Telephone Encounter (Signed)
Pt has active labs in already. Should pt come in or wait to see what lab results are? Please advise. Thank you

## 2021-04-27 NOTE — Telephone Encounter (Signed)
Patient states blood sugars are running high and wanting to make appointment but wanting to know if he need labs done first.He states wound care referred him back to primary care to have blood work done

## 2021-04-28 ENCOUNTER — Other Ambulatory Visit: Payer: Self-pay | Admitting: *Deleted

## 2021-04-28 DIAGNOSIS — E118 Type 2 diabetes mellitus with unspecified complications: Secondary | ICD-10-CM

## 2021-04-28 MED ORDER — LEVEMIR FLEXTOUCH 100 UNIT/ML ~~LOC~~ SOPN: PEN_INJECTOR | SUBCUTANEOUS | 0 refills | Status: DC

## 2021-04-28 NOTE — Telephone Encounter (Signed)
Pt to increase the levimir to 15 units at night and if still seeing numbers fasting still over 200, in the AM then increase to 20 units levimir at night.   Call with numbers on Monday or Tuesday and we can help titrate the lantus from there.   Have pt also decrease carb intake avoid rices, pasta, noodles, breads, cakes, snacks, and alcohol.   Thanks,   Dr. Lovena Le

## 2021-04-28 NOTE — Telephone Encounter (Signed)
Pls call pt and what is "high" give [place] numbers.   Is he taking the 10 units of levimir at night?   Dr. Lovena Le

## 2021-04-28 NOTE — Telephone Encounter (Signed)
Pt contacted. Pt state his blood sugars are averaging over 300; 320 every day-checking once a day. Pt is actually doing 12 units of Levemir. Please advise. Thank you

## 2021-04-28 NOTE — Telephone Encounter (Signed)
Attempted to contact patient. Voicemail is full;unable to leave message °

## 2021-04-28 NOTE — Telephone Encounter (Signed)
Discussed with pt. Pt verbalized understanding.  °

## 2021-04-30 ENCOUNTER — Other Ambulatory Visit: Payer: Self-pay | Admitting: Nurse Practitioner

## 2021-04-30 DIAGNOSIS — K7031 Alcoholic cirrhosis of liver with ascites: Secondary | ICD-10-CM

## 2021-05-09 ENCOUNTER — Encounter: Payer: Self-pay | Admitting: Internal Medicine

## 2021-05-09 ENCOUNTER — Other Ambulatory Visit: Payer: Self-pay

## 2021-05-09 ENCOUNTER — Ambulatory Visit (INDEPENDENT_AMBULATORY_CARE_PROVIDER_SITE_OTHER): Payer: Managed Care, Other (non HMO) | Admitting: Internal Medicine

## 2021-05-09 VITALS — BP 137/80 | HR 85 | Temp 97.3°F | Ht 73.0 in | Wt 266.6 lb

## 2021-05-09 DIAGNOSIS — K7031 Alcoholic cirrhosis of liver with ascites: Secondary | ICD-10-CM | POA: Diagnosis not present

## 2021-05-09 NOTE — Patient Instructions (Signed)
Good to see you today.  Continue Dexilant 60 mg daily for reflux  Continue nadolol  Continue Xifaxan 550 mg twice daily  Continue lactulose daily-goal is 3-4 semiformed stools daily  CBC, Chem-12, INR today  Repeat ultrasound to screen liver and check for TIPS patency in 6 months  Hepatitis A and B vaccine if needed (will review the record)  As discussed it is best for you to completely abstain from any alcohol products moving forward  Also, as discussed, with the insidious nature of encephalopathy, there is an increased risk of slow reaction times while operating machinery at work along with driving.  Caution around firearms as well.  Due to your family history of colon polyps, I recommend you have a high risk screening colonoscopy in the near future.  ASA 3/propofol  Office visit here in 4 months.  Further recommendations to follow.
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
## 2021-05-10 ENCOUNTER — Encounter (HOSPITAL_BASED_OUTPATIENT_CLINIC_OR_DEPARTMENT_OTHER): Payer: Managed Care, Other (non HMO) | Admitting: Physician Assistant

## 2021-05-15 ENCOUNTER — Encounter: Payer: Self-pay | Admitting: *Deleted

## 2021-05-15 ENCOUNTER — Telehealth: Payer: Self-pay | Admitting: *Deleted

## 2021-05-15 MED ORDER — PEG 3350-KCL-NA BICARB-NACL 420 G PO SOLR: ORAL | 0 refills | Status: DC

## 2021-05-15 NOTE — Telephone Encounter (Signed)
Called pt. He has been scheduled for TCS with propofol, ASA 3, Dr. Gala Romney on 06/22/2021 Thursday at 1:45pm. Patient aware will mail prep instructions with pre-op appt. Will send Rx for prep to [facility] per pt request. Confirmed address.

## 2021-05-16 ENCOUNTER — Ambulatory Visit: Payer: Managed Care, Other (non HMO) | Admitting: Nurse Practitioner

## 2021-05-16 NOTE — Progress Notes (Signed)
See other note

## 2021-05-16 NOTE — Progress Notes (Signed)
Letter and repeat labs mailed today.

## 2021-05-23 ENCOUNTER — Other Ambulatory Visit: Payer: Self-pay | Admitting: Family Medicine

## 2021-05-27 ENCOUNTER — Other Ambulatory Visit: Payer: Self-pay | Admitting: Nurse Practitioner

## 2021-06-11 ENCOUNTER — Other Ambulatory Visit: Payer: Self-pay | Admitting: Family Medicine

## 2021-06-14 NOTE — Patient Instructions (Signed)
Bradley Miles  06/14/2021     @PREFPERIOPPHARMACY @   Your procedure is scheduled on  06/22/2021.   Report to Bradley Miles at  1145  A.M.   Call this number if you have problems the morning of surgery:  [phone]   Remember:  Follow the diet and prep instructions given to you by the office.  Take 7.5 units of levemir the night before your procedure.  DO NOT take any medications for diabetes the morning of your procedure.  If your glucose is 70 or below the morning of your procedure, drink 1/2 cup of clear liquid containing sugar and recheck your glucose in 15 minutes. If your glucose is still 70 or below, call [phone] for instructions.  If your glucose is 300 or above the morning of your procedure, call [phone] for instructions.      Take these medicines the morning of surgery with A SIP OF WATER     allopurinol, dexilant, gabapentin, hydrocodone (if needed), nadolol, zofran (if needed), paxil.     Do not wear jewelry, make-up or nail polish.  Do not wear lotions, powders, or perfumes, or deodorant.  Do not shave 48 hours prior to surgery.  Men may shave face and neck.  Do not bring valuables to the hospital.  [facility] is not responsible for any belongings or valuables.  Contacts, dentures or bridgework may not be worn into surgery.  Leave your suitcase in the car.  After surgery it may be brought to your room.  For patients admitted to the hospital, discharge time will be determined by your treatment team.  Patients discharged the day of surgery will not be allowed to drive home and must have someone with them for 24 hours.    Special instructions:    DO NOT smoke tobacco or vape for 24 hours before your procedure.  Please read over the following fact sheets that you were given. Anesthesia Post-op Instructions and Care and Recovery After Surgery      Colonoscopy, Adult, Care After This sheet gives you information about how to care for  yourself after your procedure. Your health care provider may also give you more specific instructions. If you have problems or questions, contact your health careprovider. What can I expect after the procedure? After the procedure, it is common to have: A small amount of blood in your stool for 24 hours after the procedure. Some gas. Mild cramping or bloating of your abdomen. Follow these instructions at home: Eating and drinking  Drink enough fluid to keep your urine pale yellow. Follow instructions from your health care provider about eating or drinking restrictions. Resume your normal diet as instructed by your health care provider. Avoid heavy or fried foods that are hard to digest.  Activity Rest as told by your health care provider. Avoid sitting for a long time without moving. Get up to take short walks every 1-2 hours. This is important to improve blood flow and breathing. Ask for help if you feel weak or unsteady. Return to your normal activities as told by your health care provider. Ask your health care provider what activities are safe for you. Managing cramping and bloating  Try walking around when you have cramps or feel bloated. Apply heat to your abdomen as told by your health care provider. Use the heat source that your health care provider recommends, such as a moist heat pack or a heating pad. Place a towel between your skin  and the heat source. Leave the heat on for 20-30 minutes. Remove the heat if your skin turns bright red. This is especially important if you are unable to feel pain, heat, or cold. You may have a greater risk of getting burned.  General instructions If you were given a sedative during the procedure, it can affect you for several hours. Do not drive or operate machinery until your health care provider says that it is safe. For the first 24 hours after the procedure: Do not sign important documents. Do not drink alcohol. Do your regular daily  activities at a slower pace than normal. Eat soft foods that are easy to digest. Take over-the-counter and prescription medicines only as told by your health care provider. Keep all follow-up visits as told by your health care provider. This is important. Contact a health care provider if: You have blood in your stool 2-3 days after the procedure. Get help right away if you have: More than a small spotting of blood in your stool. Large blood clots in your stool. Swelling of your abdomen. Nausea or vomiting. A fever. Increasing pain in your abdomen that is not relieved with medicine. Summary After the procedure, it is common to have a small amount of blood in your stool. You may also have mild cramping and bloating of your abdomen. If you were given a sedative during the procedure, it can affect you for several hours. Do not drive or operate machinery until your health care provider says that it is safe. Get help right away if you have a lot of blood in your stool, nausea or vomiting, a fever, or increased pain in your abdomen. This information is not intended to replace advice given to you by your health care provider. Make sure you discuss any questions you have with your healthcare provider. Document Revised: 11/20/2019 Document Reviewed: 06/22/2019 Elsevier Patient Education  [place] After This sheet gives you information about how to care for yourself after your procedure. Your health care provider may also give you more specific instructions. If you have problems or questions, contact your health careprovider. What can I expect after the procedure? After the procedure, it is common to have: Tiredness. Forgetfulness about what happened after the procedure. Impaired judgment for important decisions. Nausea or vomiting. Some difficulty with balance. Follow these instructions at home: For the time period you were told by your health care  provider:     Rest as needed. Do not participate in activities where you could fall or become injured. Do not drive or use machinery. Do not drink alcohol. Do not take sleeping pills or medicines that cause drowsiness. Do not make important decisions or sign legal documents. Do not take care of children on your own. Eating and drinking Follow the diet that is recommended by your health care provider. Drink enough fluid to keep your urine pale yellow. If you vomit: Drink water, juice, or soup when you can drink without vomiting. Make sure you have little or no nausea before eating solid foods. General instructions Have a responsible adult stay with you for the time you are told. It is important to have someone help care for you until you are awake and alert. Take over-the-counter and prescription medicines only as told by your health care provider. If you have sleep apnea, surgery and certain medicines can increase your risk for breathing problems. Follow instructions from your health care provider about wearing your sleep device: Anytime  you are sleeping, including during daytime naps. While taking prescription pain medicines, sleeping medicines, or medicines that make you drowsy. Avoid smoking. Keep all follow-up visits as told by your health care provider. This is important. Contact a health care provider if: You keep feeling nauseous or you keep vomiting. You feel light-headed. You are still sleepy or having trouble with balance after 24 hours. You develop a rash. You have a fever. You have redness or swelling around the IV site. Get help right away if: You have trouble breathing. You have new-onset confusion at home. Summary For several hours after your procedure, you may feel tired. You may also be forgetful and have poor judgment. Have a responsible adult stay with you for the time you are told. It is important to have someone help care for you until you are awake and  alert. Rest as told. Do not drive or operate machinery. Do not drink alcohol or take sleeping pills. Get help right away if you have trouble breathing, or if you suddenly become confused. This information is not intended to replace advice given to you by your health care provider. Make sure you discuss any questions you have with your healthcare provider. Document Revised: 08/11/2020 Document Reviewed: 10/29/2019 Elsevier Patient Education  2022 [employer].

## 2021-06-16 ENCOUNTER — Other Ambulatory Visit: Payer: Self-pay

## 2021-06-16 ENCOUNTER — Encounter (HOSPITAL_COMMUNITY)
Admission: RE | Admit: 2021-06-16 | Discharge: 2021-06-16 | Disposition: A | Discharge status: Home / Self Care | Payer: Managed Care, Other (non HMO) | Source: Ambulatory Visit | Attending: Internal Medicine | Admitting: Internal Medicine

## 2021-06-16 DIAGNOSIS — Z01812 Encounter for preprocedural laboratory examination: Secondary | ICD-10-CM | POA: Diagnosis present

## 2021-06-16 LAB — CBC WITH DIFFERENTIAL/PLATELET
Abs Immature Granulocytes: 0.02 10*3/uL (ref 0.00–0.07)
Basophils Absolute: 0 10*3/uL (ref 0.0–0.1)
Basophils Relative: 1 %
Eosinophils Absolute: 0.2 10*3/uL (ref 0.0–0.5)
Eosinophils Relative: 5 %
HCT: 45.1 % (ref 39.0–52.0)
Hemoglobin: 15.4 g/dL (ref 13.0–17.0)
Immature Granulocytes: 0 %
Lymphocytes Relative: 14 %
Lymphs Abs: 0.6 10*3/uL — ABNORMAL LOW (ref 0.7–4.0)
MCH: 38.5 pg — ABNORMAL HIGH (ref 26.0–34.0)
MCHC: 34.1 g/dL (ref 30.0–36.0)
MCV: 112.8 fL — ABNORMAL HIGH (ref 80.0–100.0)
Monocytes Absolute: 0.7 10*3/uL (ref 0.1–1.0)
Monocytes Relative: 15 %
Neutro Abs: 2.9 10*3/uL (ref 1.7–7.7)
Neutrophils Relative %: 65 %
Platelets: 71 10*3/uL — ABNORMAL LOW (ref 150–400)
RBC: 4 MIL/uL — ABNORMAL LOW (ref 4.22–5.81)
RDW: 13.8 % (ref 11.5–15.5)
WBC: 4.5 10*3/uL (ref 4.0–10.5)
nRBC: 0 % (ref 0.0–0.2)

## 2021-06-16 LAB — COMPREHENSIVE METABOLIC PANEL
ALT: 20 U/L (ref 0–44)
AST: 45 U/L — ABNORMAL HIGH (ref 15–41)
Albumin: 3 g/dL — ABNORMAL LOW (ref 3.5–5.0)
Alkaline Phosphatase: 123 U/L (ref 38–126)
Anion gap: 10 (ref 5–15)
BUN: 5 mg/dL — ABNORMAL LOW (ref 6–20)
CO2: 21 mmol/L — ABNORMAL LOW (ref 22–32)
Calcium: 8.6 mg/dL — ABNORMAL LOW (ref 8.9–10.3)
Chloride: 99 mmol/L (ref 98–111)
Creatinine, Ser: 0.79 mg/dL (ref 0.61–1.24)
GFR, Estimated: >60 mL/min (ref >60)
Glucose, Bld: 423 mg/dL — ABNORMAL HIGH (ref 70–99)
Potassium: 3.6 mmol/L (ref 3.5–5.1)
Sodium: 130 mmol/L — ABNORMAL LOW (ref 135–145)
Total Bilirubin: 3.5 mg/dL — ABNORMAL HIGH (ref 0.3–1.2)
Total Protein: 7.1 g/dL (ref 6.5–8.1)

## 2021-06-16 LAB — PROTIME-INR
INR: 1.4 — ABNORMAL HIGH (ref 0.8–1.2)
Prothrombin Time: 17.4 seconds — ABNORMAL HIGH (ref 11.4–15.2)

## 2021-06-16 NOTE — Progress Notes (Signed)
   06/16/21 1500  OBSTRUCTIVE SLEEP APNEA  Have you ever been diagnosed with sleep apnea through a sleep study? No  If yes, do you have and use a CPAP or BPAP machine every night? 0  Do you snore loudly (loud enough to be heard through closed doors)?  1  Do you often feel tired, fatigued, or sleepy during the daytime (such as falling asleep during driving or talking to someone)? 0  Has anyone observed you stop breathing during your sleep? 1  Do you have, or are you being treated for high blood pressure? 1  BMI more than 35 kg/m2? 1  Age > 50 (1-yes) 1  Neck circumference greater than:Male 16 inches or larger, Male 17inches or larger? 0  Male Gender (Yes=1) 1  Obstructive Sleep Apnea Score 6

## 2021-06-19 ENCOUNTER — Other Ambulatory Visit: Payer: Self-pay

## 2021-06-19 DIAGNOSIS — K7031 Alcoholic cirrhosis of liver with ascites: Secondary | ICD-10-CM

## 2021-06-19 DIAGNOSIS — I8501 Esophageal varices with bleeding: Secondary | ICD-10-CM

## 2021-06-19 DIAGNOSIS — K920 Hematemesis: Secondary | ICD-10-CM

## 2021-06-19 NOTE — Pre-Procedure Instructions (Signed)
PT/INR and Comp panel routed to [facility].

## 2021-06-19 NOTE — Pre-Procedure Instructions (Signed)
Glucose of 423 discussed with Dr Adalberto Ill. He wants patient to take his full dose (15 units) of Levemir the night before his procedure. Called and spoke with his wife, [name] because patient works 2nd shift and was still asleep. She verbalized understanding for Bradley Miles to take his full dose of Levemir the night before his procedure.

## 2021-06-20 ENCOUNTER — Telehealth: Payer: Self-pay | Admitting: Family Medicine

## 2021-06-20 DIAGNOSIS — T24331A Burn of third degree of right lower leg, initial encounter: Secondary | ICD-10-CM

## 2021-06-20 NOTE — Telephone Encounter (Signed)
Ok, pls give referral.  To wound center.   Give [place] some numbers of how his glucose is running 2x per day.  And we can try to adjust his medications.   Dr. Lovena Le

## 2021-06-20 NOTE — Telephone Encounter (Signed)
Referral ordered in Epic. Telephone call- voicemail full
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
## 2021-06-20 NOTE — Telephone Encounter (Signed)
Patient is needing a referral to wound care. He was seen here 04/18/21 and referred out and was doing fine but now its back red.please advise

## 2021-06-20 NOTE — Telephone Encounter (Signed)
Left message to return call 

## 2021-06-21 ENCOUNTER — Telehealth: Payer: Self-pay | Admitting: Internal Medicine

## 2021-06-21 ENCOUNTER — Telehealth: Payer: Self-pay | Admitting: Family Medicine

## 2021-06-21 ENCOUNTER — Other Ambulatory Visit: Payer: Self-pay | Admitting: *Deleted

## 2021-06-21 ENCOUNTER — Telehealth: Payer: Self-pay | Admitting: *Deleted

## 2021-06-21 ENCOUNTER — Ambulatory Visit (HOSPITAL_COMMUNITY): Payer: Managed Care, Other (non HMO) | Attending: Family Medicine | Admitting: Physical Therapy

## 2021-06-21 ENCOUNTER — Encounter (HOSPITAL_COMMUNITY): Payer: Self-pay | Admitting: Physical Therapy

## 2021-06-21 ENCOUNTER — Other Ambulatory Visit: Payer: Self-pay

## 2021-06-21 DIAGNOSIS — M79661 Pain in right lower leg: Secondary | ICD-10-CM | POA: Insufficient documentation

## 2021-06-21 DIAGNOSIS — T24031A Burn of unspecified degree of right lower leg, initial encounter: Secondary | ICD-10-CM | POA: Insufficient documentation

## 2021-06-21 MED ORDER — CEPHALEXIN 500 MG PO CAPS: 500.0000 mg | ORAL_CAPSULE | Freq: Four times a day (QID) | ORAL | 0 refills | Status: DC

## 2021-06-21 NOTE — Telephone Encounter (Signed)
Patient stated he is taking the 20 units of levemir as prescribed. Patient advised per Dr Lovena Le to increase to 30 units of levemir at bedtime and check glucose readings daily and follow up in office next week. Patient scheduled follow office visit next week with Dr Lovena Le.

## 2021-06-21 NOTE — Telephone Encounter (Signed)
Reba spoke to pt's wife.

## 2021-06-21 NOTE — Telephone Encounter (Signed)
Blood Sugar Readings: 04/30/21 fasting 222                                       05/04/21 fasting 172                                       05/11/21   fasting 161                                       06/04/21 fasting 333                                       06/12/21   fasting 313                                       06/16/21  non fasting 423                                       06/19/21 fasting 179
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
## 2021-06-21 NOTE — Telephone Encounter (Signed)
PT from wound center called and sent picture of wound. They feel like patient needs antibiotic for wound. Dr Sallee Lange reviewed the picture and authorized Keflex 500mg  one QID for 7 days. Patient has follow up office visit scheduled with Dr Lovena Le 06/27/21 and advised to call back if worse before the appointment. Prescription sent electronically to pharmacy.
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
## 2021-06-21 NOTE — Telephone Encounter (Signed)
Pls call pt to see how many units pt is taking of levimir?  If taking 20 units of levimir, then increase to 30 units qhs.   Then have pt keep record of glucose numbers day and f/u next week in office to discuss his leg and dm.   Thank you,   Dr. Lovena Le
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
## 2021-06-22 ENCOUNTER — Other Ambulatory Visit: Payer: Self-pay

## 2021-06-22 ENCOUNTER — Ambulatory Visit (HOSPITAL_COMMUNITY): Payer: Managed Care, Other (non HMO) | Admitting: Anesthesiology

## 2021-06-22 ENCOUNTER — Encounter (HOSPITAL_COMMUNITY)
Admission: RE | Disposition: A | Discharge status: Home / Self Care | Payer: Self-pay | Source: Home / Self Care | Attending: Internal Medicine

## 2021-06-22 ENCOUNTER — Ambulatory Visit (HOSPITAL_COMMUNITY)
Admission: RE | Admit: 2021-06-22 | Discharge: 2021-06-22 | Disposition: A | Discharge status: Home / Self Care | Payer: Managed Care, Other (non HMO) | Attending: Internal Medicine | Admitting: Internal Medicine

## 2021-06-22 ENCOUNTER — Encounter (HOSPITAL_COMMUNITY): Payer: Self-pay | Admitting: Internal Medicine

## 2021-06-22 DIAGNOSIS — D122 Benign neoplasm of ascending colon: Secondary | ICD-10-CM | POA: Insufficient documentation

## 2021-06-22 DIAGNOSIS — Z8371 Family history of colonic polyps: Secondary | ICD-10-CM | POA: Insufficient documentation

## 2021-06-22 DIAGNOSIS — D175 Benign lipomatous neoplasm of intra-abdominal organs: Secondary | ICD-10-CM | POA: Diagnosis not present

## 2021-06-22 DIAGNOSIS — K635 Polyp of colon: Secondary | ICD-10-CM | POA: Diagnosis not present

## 2021-06-22 DIAGNOSIS — Z1211 Encounter for screening for malignant neoplasm of colon: Secondary | ICD-10-CM | POA: Diagnosis not present

## 2021-06-22 DIAGNOSIS — D125 Benign neoplasm of sigmoid colon: Secondary | ICD-10-CM | POA: Diagnosis not present

## 2021-06-22 DIAGNOSIS — K5730 Diverticulosis of large intestine without perforation or abscess without bleeding: Secondary | ICD-10-CM | POA: Insufficient documentation

## 2021-06-22 DIAGNOSIS — F17210 Nicotine dependence, cigarettes, uncomplicated: Secondary | ICD-10-CM | POA: Insufficient documentation

## 2021-06-22 DIAGNOSIS — Z79899 Other long term (current) drug therapy: Secondary | ICD-10-CM | POA: Diagnosis not present

## 2021-06-22 DIAGNOSIS — Z794 Long term (current) use of insulin: Secondary | ICD-10-CM | POA: Insufficient documentation

## 2021-06-22 HISTORY — PX: POLYPECTOMY: SHX5525

## 2021-06-22 HISTORY — PX: COLONOSCOPY WITH PROPOFOL: SHX5780

## 2021-06-22 LAB — GLUCOSE, CAPILLARY: Glucose-Capillary: 163 mg/dL — ABNORMAL HIGH (ref 70–99)

## 2021-06-22 SURGERY — COLONOSCOPY WITH PROPOFOL
Anesthesia: General

## 2021-06-22 MED ORDER — PROPOFOL 500 MG/50ML IV EMUL
INTRAVENOUS | Status: DC | PRN
  Administered 2021-06-22: 150 ug/kg/min via INTRAVENOUS

## 2021-06-22 MED ORDER — LACTATED RINGERS IV SOLN: INTRAVENOUS | Status: DC

## 2021-06-22 MED ORDER — MIDAZOLAM HCL 2 MG/2ML IJ SOLN
INTRAMUSCULAR | Status: DC | PRN
  Administered 2021-06-22: 2 mg via INTRAVENOUS

## 2021-06-22 MED ORDER — LIDOCAINE HCL (CARDIAC) PF 100 MG/5ML IV SOSY
PREFILLED_SYRINGE | INTRAVENOUS | Status: DC | PRN
  Administered 2021-06-22: 50 mg via INTRAVENOUS

## 2021-06-22 MED ORDER — MIDAZOLAM HCL 2 MG/2ML IJ SOLN
INTRAMUSCULAR | Status: AC
  Filled 2021-06-22: qty 2

## 2021-06-22 MED ORDER — PROPOFOL 10 MG/ML IV BOLUS
INTRAVENOUS | Status: DC | PRN
  Administered 2021-06-22: 100 mg via INTRAVENOUS

## 2021-06-22 MED ORDER — GLUCAGON HCL RDNA (DIAGNOSTIC) 1 MG IJ SOLR
INTRAMUSCULAR | Status: DC | PRN
  Administered 2021-06-22: .5 mg via INTRAVENOUS

## 2021-06-22 MED ORDER — STERILE WATER FOR IRRIGATION IR SOLN
Status: DC | PRN
  Administered 2021-06-22: 1.5 mL

## 2021-06-22 MED ORDER — PHENYLEPHRINE 40 MCG/ML (10ML) SYRINGE FOR IV PUSH (FOR BLOOD PRESSURE SUPPORT)
PREFILLED_SYRINGE | INTRAVENOUS | Status: AC
  Filled 2021-06-22: qty 10

## 2021-06-22 MED ORDER — PHENYLEPHRINE 40 MCG/ML (10ML) SYRINGE FOR IV PUSH (FOR BLOOD PRESSURE SUPPORT)
PREFILLED_SYRINGE | INTRAVENOUS | Status: DC | PRN
  Administered 2021-06-22 (×2): 80 ug via INTRAVENOUS

## 2021-06-22 NOTE — Anesthesia Preprocedure Evaluation (Signed)
Anesthesia Evaluation  Patient identified by MRN, date of birth, ID band Patient awake    Reviewed: Allergy & Precautions, H&P , NPO status , Patient's Chart, lab work & pertinent test results, reviewed documented beta blocker date and time   Airway Mallampati: II  TM Distance: >3 FB Neck ROM: full    Dental no notable dental hx.    Pulmonary shortness of breath, COPD, Current Smoker and Patient abstained from smoking.,    Pulmonary exam normal breath sounds clear to auscultation       Cardiovascular Exercise Tolerance: Good hypertension, negative cardio ROS   Rhythm:regular Rate:Normal     Neuro/Psych  Headaches, PSYCHIATRIC DISORDERS Anxiety  Neuromuscular disease    GI/Hepatic GERD  Medicated,(+) Cirrhosis   Esophageal Varices  substance abuse  alcohol use, Hepatitis -, Toxin Related  Endo/Other  negative endocrine ROSdiabetes, Type 2  Renal/GU negative Renal ROS  negative genitourinary   Musculoskeletal   Abdominal   Peds  Hematology  (+) Blood dyscrasia, anemia ,   Anesthesia Other Findings 1. Left ventricular ejection fraction, by estimation, is 60 to 65%. The  left ventricle has normal function. The left ventricle has no regional  wall motion abnormalities. Left ventricular diastolic parameters are  consistent with Grade I diastolic  dysfunction (impaired relaxation). Left ventricular filling pressures are  probably abnormally low.  2. Right ventricular systolic function is normal. The right ventricular  size is normal.  3. The mitral valve is normal in structure. No evidence of mitral valve  regurgitation. No evidence of mitral stenosis.  4. The aortic valve is normal in structure. Aortic valve regurgitation is  not visualized. No aortic stenosis is present.  5. The inferior vena cava is normal in size with greater than 50%  respiratory variability, suggesting right atrial pressure of 3 mmHg.    Reproductive/Obstetrics negative OB ROS                             Anesthesia Physical Anesthesia Plan  ASA: 3  Anesthesia Plan: General   Post-op Pain Management:    Induction:   PONV Risk Score and Plan: Propofol infusion  Airway Management Planned:   Additional Equipment:   Intra-op Plan:   Post-operative Plan:   Informed Consent: I have reviewed the patients History and Physical, chart, labs and discussed the procedure including the risks, benefits and alternatives for the proposed anesthesia with the patient or authorized representative who has indicated his/her understanding and acceptance.     Dental Advisory Given  Plan Discussed with: CRNA  Anesthesia Plan Comments:         Anesthesia Quick Evaluation

## 2021-06-22 NOTE — Op Note (Signed)
[facility] Patient Name: Bradley Miles Procedure Date: 06/22/2021 1:33 PM MRN: [id number] Date of Birth: Nov 26, 1966 Attending MD: Norvel Richards , MD CSN: [id number] Age: 55 Admit Type: Outpatient Procedure:                Colonoscopy Indications:              Colon cancer screening in patient at increased                            risk: Family history of 1st-degree relative with                            colon polyps Providers:                Norvel Richards, MD, Caprice Kluver, Gilverto Hoff., Technician Referring MD:              Medicines:                Propofol per Anesthesia Complications:            No immediate complications. Estimated Blood Loss:     Estimated blood loss was minimal. Procedure:                Pre-Anesthesia Assessment:                           - Prior to the procedure, a History and Physical                            was performed, and patient medications and                            allergies were reviewed. The patient's tolerance of                            previous anesthesia was also reviewed. The risks                            and benefits of the procedure and the sedation                            options and risks were discussed with the patient.                            All questions were answered, and informed consent                            was obtained. Prior Anticoagulants: The patient has                            taken no previous anticoagulant or antiplatelet                            agents.  ASA Grade Assessment: III - A patient with                            severe systemic disease. After reviewing the risks                            and benefits, the patient was deemed in                            satisfactory condition to undergo the procedure.                           After obtaining informed consent, the colonoscope                            was passed under direct  vision. Throughout the                            procedure, the patient's blood pressure, pulse, and                            oxygen saturations were monitored continuously. The                            CF-[id number] ([id number]) scope was introduced through                            the anus and advanced to the the cecum, identified                            by appendiceal orifice and ileocecal valve. The                            colonoscopy was performed without difficulty. The                            patient tolerated the procedure well. The quality                            of the bowel preparation was adequate. Scope In: 1:56:24 PM Scope Out: 2:27:41 PM Scope Withdrawal Time: 0 hours 21 minutes 2 seconds  Total Procedure Duration: 0 hours 31 minutes 17 seconds  Findings:      The perianal and digital rectal examinations were normal.      Multiple small and large-mouthed diverticula were found in the entire       colon. 1 cm yellowish submucosal nodule distal descending. Positive       pillow sign.      Ten semi-pedunculated polyps were found in the sigmoid colon and       ascending colon. The polyps were 3 to 8 mm in size. These polyps were       removed with a cold snare. Resection and retrieval were complete.       Estimated blood loss was minimal.      The exam was otherwise without abnormality on  direct and retroflexion       views. Impression:               - Diverticulosis in the entire examined colon.                            Colon lipoma.                           - Ten 3 to 8 mm polyps in the sigmoid colon and in                            the ascending colon, removed with a cold snare.                            Resected and retrieved.                           - The examination was otherwise normal on direct                            and retroflexion views. Moderate Sedation:      Moderate (conscious) sedation was personally administered by an        anesthesia professional. The following parameters were monitored: oxygen       saturation, heart rate, blood pressure, respiratory rate, EKG, adequacy       of pulmonary ventilation, and response to care. Recommendation:           - Patient has a contact number available for                            emergencies. The signs and symptoms of potential                            delayed complications were discussed with the                            patient. Return to normal activities tomorrow.                            Written discharge instructions were provided to the                            patient.                           - Resume previous diet.                           - Continue present medications.                           - Repeat colonoscopy in 5 years for surveillance                            based on pathology results.                           -  Return to GI office in 3 months. Procedure Code(s):        --- Professional ---                           [phone], Colonoscopy, flexible; with removal of                            tumor(s), polyp(s), or other lesion(s) by snare                            technique Diagnosis Code(s):        --- Professional ---                           Z83.71, Family history of colonic polyps                           K63.5, Polyp of colon                           K57.30, Diverticulosis of large intestine without                            perforation or abscess without bleeding CPT copyright 2019 [employer] Association. All rights reserved. The codes documented in this report are preliminary and upon coder review may  be revised to meet current compliance requirements. Cristopher Estimable. Shriyan Arakawa, MD Norvel Richards, MD 06/22/2021 2:38:48 PM This report has been signed electronically. Number of Addenda: 0

## 2021-06-22 NOTE — Anesthesia Procedure Notes (Signed)
Date/Time: 06/22/2021 2:00 PM Performed by: Orlie Dakin, CRNA Pre-anesthesia Checklist: Patient identified, Emergency Drugs available, Suction available and Patient being monitored Patient Re-evaluated:Patient Re-evaluated prior to induction Oxygen Delivery Method: Nasal cannula Induction Type: IV induction Placement Confirmation: positive ETCO2
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
## 2021-06-22 NOTE — Anesthesia Postprocedure Evaluation (Signed)
Anesthesia Post Note  Patient: Bradley Miles  Procedure(s) Performed: COLONOSCOPY WITH PROPOFOL POLYPECTOMY  Patient location during evaluation: Phase II Anesthesia Type: General Level of consciousness: awake Pain management: pain level controlled Vital Signs Assessment: post-procedure vital signs reviewed and stable Respiratory status: spontaneous breathing and respiratory function stable Cardiovascular status: blood pressure returned to baseline and stable Postop Assessment: no headache and no apparent nausea or vomiting Anesthetic complications: no Comments: Late entry   No notable events documented.   Last Vitals:  Vitals:   06/22/21 1315 06/22/21 1431  BP:  (!) 96/57  Pulse:  81  Resp: 14 16  Temp:  36.5 C  SpO2: 92% 98%    Last Pain:  Vitals:   06/22/21 1431  TempSrc: Axillary  PainSc: 0-No pain                 Louann Sjogren

## 2021-06-22 NOTE — Transfer of Care (Signed)
Immediate Anesthesia Transfer of Care Note  Patient: Bradley Miles  Procedure(s) Performed: COLONOSCOPY WITH PROPOFOL POLYPECTOMY  Patient Location: Short Stay  Anesthesia Type:General  Level of Consciousness: awake  Airway & Oxygen Therapy: Patient Spontanous Breathing  Post-op Assessment: Report given to RN and Post -op Vital signs reviewed and stable  Post vital signs: Reviewed and stable  Last Vitals:  Vitals Value Taken Time  BP    Temp    Pulse    Resp    SpO2      Last Pain:  Vitals:   06/22/21 1349  TempSrc:   PainSc: 0-No pain      Patients Stated Pain Goal: 6 (37/44/51 4604)  Complications: No notable events documented.

## 2021-06-22 NOTE — Discharge Instructions (Signed)
  Colonoscopy Discharge Instructions  Read the instructions outlined below and refer to this sheet in the next few weeks. These discharge instructions provide you with general information on caring for yourself after you leave the hospital. Your doctor may also give you specific instructions. While your treatment has been planned according to the most current medical practices available, unavoidable complications occasionally occur. If you have any problems or questions after discharge, call Dr. Gala Miles at [phone]. ACTIVITY You may resume your regular activity, but move at a slower pace for the next 24 hours.  Take frequent rest periods for the next 24 hours.  Walking will help get rid of the air and reduce the bloated feeling in your belly (abdomen).  No driving for 24 hours (because of the medicine (anesthesia) used during the test).   Do not sign any important legal documents or operate any machinery for 24 hours (because of the anesthesia used during the test).  NUTRITION Drink plenty of fluids.  You may resume your normal diet as instructed by your doctor.  Begin with a light meal and progress to your normal diet. Heavy or fried foods are harder to digest and may make you feel sick to your stomach (nauseated).  Avoid alcoholic beverages for 24 hours or as instructed.  MEDICATIONS You may resume your normal medications unless your doctor tells you otherwise.  WHAT YOU CAN EXPECT TODAY Some feelings of bloating in the abdomen.  Passage of more gas than usual.  Spotting of blood in your stool or on the toilet paper.  IF YOU HAD POLYPS REMOVED DURING THE COLONOSCOPY: No aspirin products for 7 days or as instructed.  No alcohol for 7 days or as instructed.  Eat a soft diet for the next 24 hours.  FINDING OUT THE RESULTS OF YOUR TEST Not all test results are available during your visit. If your test results are not back during the visit, make an appointment with your caregiver to find out the  results. Do not assume everything is normal if you have not heard from your caregiver or the medical facility. It is important for you to follow up on all of your test results.  SEEK IMMEDIATE MEDICAL ATTENTION IF: You have more than a spotting of blood in your stool.  Your belly is swollen (abdominal distention).  You are nauseated or vomiting.  You have a temperature over 101.  You have abdominal pain or discomfort that is severe or gets worse throughout the day.     Multiple polyps (10) removed from your colon today  Colon polyp and diverticulosis information provided   Further recommendations to follow pending review of pathology report  At patient request, I called Bradley Miles at [phone] -reviewed findings

## 2021-06-23 ENCOUNTER — Ambulatory Visit (HOSPITAL_COMMUNITY): Payer: Managed Care, Other (non HMO)

## 2021-06-23 ENCOUNTER — Other Ambulatory Visit (HOSPITAL_COMMUNITY)
Admission: RE | Admit: 2021-06-23 | Discharge: 2021-06-23 | Disposition: A | Discharge status: Home / Self Care | Payer: Managed Care, Other (non HMO) | Source: Skilled Nursing Facility | Attending: Family Medicine | Admitting: Family Medicine

## 2021-06-23 ENCOUNTER — Encounter (HOSPITAL_COMMUNITY): Payer: Self-pay

## 2021-06-23 ENCOUNTER — Encounter: Payer: Self-pay | Admitting: Family Medicine

## 2021-06-23 DIAGNOSIS — T24031A Burn of unspecified degree of right lower leg, initial encounter: Secondary | ICD-10-CM

## 2021-06-23 DIAGNOSIS — X58XXXA Exposure to other specified factors, initial encounter: Secondary | ICD-10-CM | POA: Diagnosis not present

## 2021-06-23 DIAGNOSIS — M79661 Pain in right lower leg: Secondary | ICD-10-CM
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
## 2021-06-25 LAB — AEROBIC CULTURE W GRAM STAIN (SUPERFICIAL SPECIMEN)

## 2021-06-26 ENCOUNTER — Encounter: Payer: Self-pay | Admitting: Internal Medicine

## 2021-06-26 ENCOUNTER — Other Ambulatory Visit: Payer: Self-pay

## 2021-06-26 ENCOUNTER — Ambulatory Visit (HOSPITAL_COMMUNITY): Payer: Managed Care, Other (non HMO) | Admitting: Physical Therapy

## 2021-06-26 DIAGNOSIS — M79661 Pain in right lower leg: Secondary | ICD-10-CM

## 2021-06-26 DIAGNOSIS — T24031A Burn of unspecified degree of right lower leg, initial encounter: Secondary | ICD-10-CM | POA: Diagnosis not present

## 2021-06-26 LAB — SURGICAL PATHOLOGY
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
## 2021-06-27 ENCOUNTER — Ambulatory Visit: Payer: Managed Care, Other (non HMO) | Admitting: Family Medicine

## 2021-06-27 ENCOUNTER — Encounter: Payer: Self-pay | Admitting: Family Medicine

## 2021-06-27 ENCOUNTER — Ambulatory Visit (HOSPITAL_COMMUNITY): Payer: Managed Care, Other (non HMO)

## 2021-06-28 ENCOUNTER — Ambulatory Visit (HOSPITAL_COMMUNITY): Payer: Managed Care, Other (non HMO)

## 2021-06-28 ENCOUNTER — Encounter (HOSPITAL_COMMUNITY): Payer: Self-pay

## 2021-06-28 ENCOUNTER — Other Ambulatory Visit: Payer: Self-pay

## 2021-06-28 DIAGNOSIS — T24031A Burn of unspecified degree of right lower leg, initial encounter: Secondary | ICD-10-CM

## 2021-06-28 DIAGNOSIS — M79661 Pain in right lower leg: Secondary | ICD-10-CM
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
## 2021-06-29 ENCOUNTER — Ambulatory Visit (INDEPENDENT_AMBULATORY_CARE_PROVIDER_SITE_OTHER): Payer: Managed Care, Other (non HMO) | Admitting: Family Medicine

## 2021-06-29 ENCOUNTER — Ambulatory Visit (HOSPITAL_COMMUNITY): Payer: Managed Care, Other (non HMO)

## 2021-06-29 ENCOUNTER — Encounter: Payer: Self-pay | Admitting: Family Medicine

## 2021-06-29 VITALS — BP 110/72 | HR 69 | Temp 97.0°F | Wt 270.0 lb

## 2021-06-29 DIAGNOSIS — E118 Type 2 diabetes mellitus with unspecified complications: Secondary | ICD-10-CM | POA: Diagnosis not present

## 2021-06-29 DIAGNOSIS — T24031D Burn of unspecified degree of right lower leg, subsequent encounter: Secondary | ICD-10-CM

## 2021-06-29 MED ORDER — LEVEMIR FLEXTOUCH 100 UNIT/ML ~~LOC~~ SOPN: PEN_INJECTOR | SUBCUTANEOUS | 0 refills | Status: DC
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
## 2021-06-30 ENCOUNTER — Telehealth: Payer: Self-pay | Admitting: Internal Medicine

## 2021-06-30 ENCOUNTER — Encounter (HOSPITAL_COMMUNITY): Payer: Self-pay | Admitting: Internal Medicine

## 2021-06-30 LAB — CMP14+EGFR
ALT: 18 IU/L (ref 0–44)
AST: 51 IU/L — ABNORMAL HIGH (ref 0–40)
Albumin/Globulin Ratio: 0.8 — ABNORMAL LOW (ref 1.2–2.2)
Albumin: 3.1 g/dL — ABNORMAL LOW (ref 3.8–4.9)
Alkaline Phosphatase: 167 IU/L — ABNORMAL HIGH (ref 44–121)
BUN/Creatinine Ratio: 4 — ABNORMAL LOW (ref 9–20)
BUN: 3 mg/dL — ABNORMAL LOW (ref 6–24)
Bilirubin Total: 1.8 mg/dL — ABNORMAL HIGH (ref 0.0–1.2)
CO2: 23 mmol/L (ref 20–29)
Calcium: 8.4 mg/dL — ABNORMAL LOW (ref 8.7–10.2)
Chloride: 99 mmol/L (ref 96–106)
Creatinine, Ser: 0.69 mg/dL — ABNORMAL LOW (ref 0.76–1.27)
Globulin, Total: 3.8 g/dL (ref 1.5–4.5)
Glucose: 209 mg/dL — ABNORMAL HIGH (ref 65–99)
Potassium: 3.9 mmol/L (ref 3.5–5.2)
Sodium: 136 mmol/L (ref 134–144)
Total Protein: 6.9 g/dL (ref 6.0–8.5)
eGFR: 111 mL/min/{1.73_m2} (ref >59)

## 2021-06-30 LAB — MICROALBUMIN, URINE: Microalbumin, Urine: 5.6 ug/mL

## 2021-06-30 LAB — HEMOGLOBIN A1C
Est. average glucose Bld gHb Est-mCnc: 203 mg/dL
Hgb A1c MFr Bld: 8.7 % — ABNORMAL HIGH (ref 4.8–5.6)

## 2021-06-30 NOTE — Telephone Encounter (Signed)
PATIENT CALLED ASKING ABOUT HIS RESULTS FROM THE PROCEDURE

## 2021-07-03 ENCOUNTER — Telehealth (HOSPITAL_COMMUNITY): Payer: Self-pay | Admitting: Physical Therapy

## 2021-07-03 ENCOUNTER — Ambulatory Visit (HOSPITAL_COMMUNITY): Payer: Managed Care, Other (non HMO) | Admitting: Physical Therapy

## 2021-07-03 NOTE — Telephone Encounter (Signed)
Pt  did not show for appt.  Called and left message offering a later appointment on voicemail.  Reminded of next appt on Friday.  Teena Irani, PTA/CLT [phone]

## 2021-07-03 NOTE — Telephone Encounter (Signed)
I would just stick with the 25 units for now of levemir.  Call with numbers in the next week of checking glucose 2x per day.   Thx.   Dr. Lovena Le

## 2021-07-03 NOTE — Telephone Encounter (Signed)
Lmom for pt to call me back.  Letter has been mailed to pt with results.

## 2021-07-03 NOTE — Telephone Encounter (Signed)
Lmom for pt to call me back. 

## 2021-07-06 ENCOUNTER — Telehealth (HOSPITAL_COMMUNITY): Payer: Self-pay | Admitting: Physical Therapy
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
## 2021-07-07 ENCOUNTER — Ambulatory Visit (HOSPITAL_COMMUNITY): Payer: Managed Care, Other (non HMO) | Admitting: Physical Therapy

## 2021-07-10 ENCOUNTER — Ambulatory Visit (HOSPITAL_COMMUNITY): Payer: Managed Care, Other (non HMO) | Admitting: Physical Therapy

## 2021-07-11 ENCOUNTER — Other Ambulatory Visit: Payer: Self-pay | Admitting: Family Medicine

## 2021-07-13 ENCOUNTER — Ambulatory Visit (HOSPITAL_COMMUNITY): Payer: Managed Care, Other (non HMO) | Admitting: Physical Therapy

## 2021-07-13 ENCOUNTER — Ambulatory Visit: Payer: Managed Care, Other (non HMO) | Admitting: Family Medicine

## 2021-07-17 ENCOUNTER — Ambulatory Visit (HOSPITAL_COMMUNITY): Payer: Managed Care, Other (non HMO) | Admitting: Physical Therapy

## 2021-07-21 ENCOUNTER — Ambulatory Visit (HOSPITAL_COMMUNITY): Payer: Managed Care, Other (non HMO) | Admitting: Physical Therapy

## 2021-08-23 ENCOUNTER — Ambulatory Visit (INDEPENDENT_AMBULATORY_CARE_PROVIDER_SITE_OTHER): Payer: Managed Care, Other (non HMO) | Admitting: Family Medicine

## 2021-08-23 ENCOUNTER — Other Ambulatory Visit: Payer: Self-pay

## 2021-08-23 VITALS — BP 122/82 | Ht 73.0 in | Wt 274.4 lb

## 2021-08-23 DIAGNOSIS — F1020 Alcohol dependence, uncomplicated: Secondary | ICD-10-CM

## 2021-08-23 DIAGNOSIS — E118 Type 2 diabetes mellitus with unspecified complications: Secondary | ICD-10-CM

## 2021-08-23 DIAGNOSIS — G621 Alcoholic polyneuropathy: Secondary | ICD-10-CM

## 2021-08-23 DIAGNOSIS — Z9289 Personal history of other medical treatment: Secondary | ICD-10-CM | POA: Diagnosis not present

## 2021-08-23 DIAGNOSIS — F32A Depression, unspecified: Secondary | ICD-10-CM

## 2021-08-23 DIAGNOSIS — K7031 Alcoholic cirrhosis of liver with ascites: Secondary | ICD-10-CM

## 2021-08-23 MED ORDER — GABAPENTIN 400 MG PO CAPS: ORAL_CAPSULE | ORAL | 2 refills | Status: DC

## 2021-08-23 MED ORDER — INSULIN PEN NEEDLE 30G X 8 MM MISC: 1.0000 | 1 refills | Status: DC | PRN

## 2021-08-23 MED ORDER — FUROSEMIDE 40 MG PO TABS: 20.0000 mg | ORAL_TABLET | Freq: Every day | ORAL | 1 refills | Status: DC

## 2021-08-23 MED ORDER — PAROXETINE HCL 40 MG PO TABS: 40.0000 mg | ORAL_TABLET | Freq: Every day | ORAL | 1 refills | Status: DC

## 2021-08-23 MED ORDER — TRUE METRIX BLOOD GLUCOSE TEST VI STRP: ORAL_STRIP | 12 refills | Status: DC

## 2021-08-23 MED ORDER — DEXLANSOPRAZOLE 60 MG PO CPDR: 60.0000 mg | DELAYED_RELEASE_CAPSULE | Freq: Every day | ORAL | 1 refills | Status: DC

## 2021-08-23 MED ORDER — SITAGLIPTIN PHOSPHATE 100 MG PO TABS
100.0000 mg | ORAL_TABLET | Freq: Every day | ORAL | 1 refills | Status: DC
Start: 2021-08-23 — End: 2022-08-07

## 2021-08-23 MED ORDER — LEVEMIR FLEXTOUCH 100 UNIT/ML ~~LOC~~ SOPN: PEN_INJECTOR | SUBCUTANEOUS | 1 refills | Status: DC

## 2021-08-23 MED ORDER — ALLOPURINOL 100 MG PO TABS: 100.0000 mg | ORAL_TABLET | Freq: Every day | ORAL | 1 refills | Status: DC

## 2021-08-23 MED ORDER — ACTI-LANCE 28G MISC: 3 refills | Status: DC

## 2021-08-23 MED ORDER — INSULIN LISPRO (1 UNIT DIAL) 100 UNIT/ML (KWIKPEN): PEN_INJECTOR | SUBCUTANEOUS | 2 refills | Status: DC

## 2021-08-23 NOTE — Progress Notes (Signed)
Patient ID: Bradley Miles, male    DOB: Oct 17, 1966, 55 y.o.   MRN: [id number]   Chief Complaint  Patient presents with   follow up after discharge from alcohol detox treatment cent    Patient was in treatment for 45 days and just got released last Thursday   Subjective:    HPI Cc- alcohol abuse recent dc from detox program.  "They put him on otc magemsium and wants to know how long to stay on it- also the Acamprosate calcium- how long to stay on it"   Pt went to alcohol treatment center for 45 days, just released 6 days ago.  For diabetes- pt still on long acting insulin and had added insulin sliding scale.  Glucose- running 200-250s on average during the day. Levemir at night- 30units. Sliding scale during day- humalog over 150 --2 units for every 50 over 150.   New med- Campral- for treating alcohol abuse, received this from the detox center. Need needles for levimir and lancets.  Test strips- accucheck guide me and trumetrics.  Seeing [facility] soon and had counseling yesterday.  Has appt with psychiatry soon.  Pt attending [employer] meetings.  Had 1 x with 5 drinks on past Monday night. Had a trigger and restarted then went back to not drinking.  Pt wanting to increase the gabapentin.  Having more pain at night with buring in the feet.  Medical History Bradley Miles has a past medical history of Anxiety, Controlled type 2 diabetes mellitus with complication, without long-term current use of insulin ([place]) (06/26/2020), Enlarged liver, GERD (gastroesophageal reflux disease), Hypertension, Palpitations, PVC's (premature ventricular contractions), Shortness of breath, Tussive syncope (12/22/2019), and Varicose veins.   Outpatient Encounter Medications as of 08/23/2021  Medication Sig   glucose blood (TRUE METRIX BLOOD GLUCOSE TEST) test strip Use as instructed   insulin lispro (HUMALOG KWIKPEN) 100 UNIT/ML KwikPen Take insulin as directed 3x per day with meals. 125 - 150 give  2 units of Humalog Insulin subq  151 - 200 give 4 units of Humalog Insulin subq  201 - 250 give 6 units of Humalog Insulin subq  251 - 300 give 8 units of Humalog Insulin subq  301 - 350 give 10 units of Humalog Insulin subq If over 350 call MD.   Insulin Pen Needle (NOVOFINE) 30G X 8 MM MISC Inject 10 each into the skin as needed.   Lancets (ACTI-LANCE 28G) MISC Use 3x per day as directed.   acamprosate (CAMPRAL) 333 MG tablet Take 666 mg by mouth 3 (three) times daily.   allopurinol (ZYLOPRIM) 100 MG tablet Take 1 tablet (100 mg total) by mouth daily.   blood glucose meter kit and supplies Dispense based on patient and insurance preference. Test blood sugar once daily.  (FOR ICD-10 E10.9, E11.9).   Cholecalciferol (D3-1000) 25 MCG (1000 UT) capsule Take 1 capsule (1,000 Units total) by mouth daily.   dexlansoprazole (DEXILANT) 60 MG capsule Take 1 capsule (60 mg total) by mouth daily.   ferrous sulfate 325 (65 FE) MG EC tablet Take 1 tablet (325 mg total) by mouth 2 (two) times daily.   folic acid (FOLVITE) 800 MCG tablet Take 400 mcg by mouth daily.   furosemide (LASIX) 40 MG tablet Take 0.5 tablets (20 mg total) by mouth daily.   gabapentin (NEURONTIN) 400 MG capsule Take 1 tab p.o. AM and noon, then take 2 tab qhs.   insulin detemir (LEVEMIR FLEXTOUCH) 100 UNIT/ML FlexPen Take 30 units qhs sq.  JANUVIA 100 MG tablet Take 100 mg by mouth every morning.   lactulose (CHRONULAC) 10 GM/15ML solution TAKE 15ML BY MOUTH THREE TIMES DAILY, TITRATE FOR 3 TO 4 SOFT BOWEL MOVEMENTS A DAY   Multiple Vitamin (MULTIVITAMIN WITH MINERALS) TABS tablet Take 1 tablet by mouth daily.   nadolol (CORGARD) 40 MG tablet TAKE 1 AND 1/2 TABLETS(60 MG) BY MOUTH DAILY   ondansetron (ZOFRAN-ODT) 4 MG disintegrating tablet DISSOLVE 1 TABLET ON THE TONGUE EVERY 8 HOURS AS NEEDED FOR NAUSEA   PARoxetine (PAXIL) 40 MG tablet Take 1 tablet (40 mg total) by mouth daily.   polyethylene glycol-electrolytes (NULYTELY) 420  g solution As directed   potassium chloride SA (KLOR-CON) 20 MEQ tablet Take 1 tablet (20 mEq total) by mouth daily as needed (Take when taking Lasix).   rifaximin (XIFAXAN) 550 MG TABS tablet Take 1 tablet (550 mg total) by mouth 2 (two) times daily.   sildenafil (VIAGRA) 100 MG tablet Take 0.5 tablets (50 mg total) by mouth as needed for erectile dysfunction.   silver sulfADIAZINE (SSD) 1 % cream Apply topically 2 (two) times daily. Apply to rt lower leg burn for 14 days.   sitaGLIPtin (JANUVIA) 100 MG tablet Take 1 tablet (100 mg total) by mouth daily.   spironolactone (ALDACTONE) 100 MG tablet TAKE 1 TABLET(100 MG) BY MOUTH DAILY   sucralfate (CARAFATE) 1 g tablet TAKE 1 TABLET BY MOUTH BEFORE MEALS   thiamine 100 MG tablet Take 1 tablet (100 mg total) by mouth daily.   [DISCONTINUED] allopurinol (ZYLOPRIM) 100 MG tablet Take 1 tablet (100 mg total) by mouth daily.   [DISCONTINUED] cephALEXin (KEFLEX) 500 MG capsule Take 1 capsule (500 mg total) by mouth 4 (four) times daily.   [DISCONTINUED] dexlansoprazole (DEXILANT) 60 MG capsule Take 1 capsule (60 mg total) by mouth daily.   [DISCONTINUED] furosemide (LASIX) 40 MG tablet Take 20 mg by mouth daily.   [DISCONTINUED] gabapentin (NEURONTIN) 300 MG capsule Take 1 capsule (300 mg total) by mouth 3 (three) times daily.   [DISCONTINUED] gabapentin (NEURONTIN) 400 MG capsule Take 400 mg by mouth 3 (three) times daily.   [DISCONTINUED] HYDROcodone-acetaminophen (NORCO) 5-325 MG tablet Take 1 tablet by mouth 3 (three) times daily as needed for moderate pain.   [DISCONTINUED] insulin detemir (LEVEMIR FLEXTOUCH) 100 UNIT/ML FlexPen Take 25 units qhs sq.   [DISCONTINUED] Insulin Pen Needle (NOVOFINE AUTOCOVER PEN NEEDLE) 30G X 8 MM MISC Inject 10 each into the skin as needed.   [DISCONTINUED] PARoxetine (PAXIL) 40 MG tablet Take 1 tablet (40 mg total) by mouth daily.   [DISCONTINUED] sitaGLIPtin (JANUVIA) 50 MG tablet Take 1 tablet (50 mg total) by  mouth daily. (Patient taking differently: Take 100 mg by mouth daily.)   [DISCONTINUED] traZODone (DESYREL) 50 MG tablet TAKE 1 TABLET BY MOUTH AT BEDTIME AS NEEDED FOR INSOMNIA   No facility-administered encounter medications on file as of 08/23/2021.     Review of Systems  Constitutional:  Negative for chills and fever.  HENT:  Negative for congestion, rhinorrhea and sore throat.   Respiratory:  Negative for cough, shortness of breath and wheezing.   Cardiovascular:  Negative for chest pain and leg swelling.  Gastrointestinal:  Negative for abdominal pain, diarrhea, nausea and vomiting.  Genitourinary:  Negative for dysuria and frequency.  Skin:  Negative for rash.  Neurological:  Negative for dizziness, weakness and headaches.    Vitals BP 122/82   Ht 6' 1"  (1.854 m)   Wt 274 lb 6.4  oz (124.5 kg)   BMI 36.20 kg/m   Objective:   Physical Exam Vitals and nursing note reviewed.  Constitutional:      General: He is not in acute distress.    Appearance: Normal appearance. He is not ill-appearing.  HENT:     Head: Normocephalic.     Nose: Nose normal. No congestion.     Mouth/Throat:     Mouth: Mucous membranes are moist.     Pharynx: No oropharyngeal exudate.  Eyes:     Extraocular Movements: Extraocular movements intact.     Conjunctiva/sclera: Conjunctivae normal.     Pupils: Pupils are equal, round, and reactive to light.  Cardiovascular:     Rate and Rhythm: Normal rate and regular rhythm.     Pulses: Normal pulses.     Heart sounds: Normal heart sounds. No murmur heard. Pulmonary:     Effort: Pulmonary effort is normal.     Breath sounds: Normal breath sounds. No wheezing, rhonchi or rales.  Musculoskeletal:        General: Normal range of motion.     Right lower leg: No edema.     Left lower leg: No edema.  Skin:    General: Skin is warm and dry.     Findings: No rash.  Neurological:     General: No focal deficit present.     Mental Status: He is alert and  oriented to person, place, and time.     Cranial Nerves: No cranial nerve deficit.  Psychiatric:        Mood and Affect: Mood normal.        Behavior: Behavior normal.        Thought Content: Thought content normal.        Judgment: Judgment normal.     Assessment and Plan   1. S/P alcohol detoxification  2. Alcohol dependence with inpatient treatment ([place]) - acamprosate (CAMPRAL) 333 MG tablet; Take 666 mg by mouth 3 (three) times daily.  3. Diabetes mellitus type 2 with complications (HCC) - JANUVIA 100 MG tablet; Take 100 mg by mouth every morning. - sitaGLIPtin (JANUVIA) 100 MG tablet; Take 1 tablet (100 mg total) by mouth daily.  Dispense: 90 tablet; Refill: 1 - Insulin Pen Needle (NOVOFINE) 30G X 8 MM MISC; Inject 10 each into the skin as needed.  Dispense: 90 each; Refill: 1 - glucose blood (TRUE METRIX BLOOD GLUCOSE TEST) test strip; Use as instructed  Dispense: 100 each; Refill: 12 - insulin detemir (LEVEMIR FLEXTOUCH) 100 UNIT/ML FlexPen; Take 30 units qhs sq.  Dispense: 15 mL; Refill: 1 - insulin lispro (HUMALOG KWIKPEN) 100 UNIT/ML KwikPen; Take insulin as directed 3x per day with meals. 125 - 150 give 2 units of Humalog Insulin subq  151 - 200 give 4 units of Humalog Insulin subq  201 - 250 give 6 units of Humalog Insulin subq  251 - 300 give 8 units of Humalog Insulin subq  301 - 350 give 10 units of Humalog Insulin subq If over 350 call MD.  Dispense: 15 mL; Refill: 2  4. Depression, unspecified depression type - PARoxetine (PAXIL) 40 MG tablet; Take 1 tablet (40 mg total) by mouth daily.  Dispense: 90 tablet; Refill: 1  5. Alcoholic cirrhosis of liver with ascites ([place])  6. Alcoholic peripheral neuropathy (HCC) - gabapentin (NEURONTIN) 400 MG capsule; Take 1 tab p.o. AM and noon, then take 2 tab qhs.  Dispense: 120 capsule; Refill: 2     -pt to f/u  with new provider in October to get labs ordered to recheck.  Pt did not have his discharge papers from the  inpatient treatment center.  Dm2- last a1c 8.7  Pt to cont levemir and sliding scale, and januvia.  Cont to check glucose 2x per day.  Call if having numbers over 250 consistently.  Gerd- stable. Cont dexilant.  Alcohol abuse- cont to abstain from alcohol and take campral as directed.  Depression- cont with f/u with psychiatry as scheduled.   Peripheral neuropathy- encouraged abstaining from alochol and to control diabetes.  Increased gabapentin 47m from tid, to taking 1 in am, 1 at noon, and 2 at night.  Since worsening burning pain in feet at night.   Return in about 4 months (around 12/23/2021) for f/u dm2, gerd, htn, alcohol abuse.

## 2021-08-24 ENCOUNTER — Ambulatory Visit: Payer: Managed Care, Other (non HMO) | Admitting: Family Medicine

## 2021-08-24 DIAGNOSIS — F32A Depression, unspecified: Secondary | ICD-10-CM | POA: Insufficient documentation

## 2021-08-24 DIAGNOSIS — Z9289 Personal history of other medical treatment: Secondary | ICD-10-CM | POA: Insufficient documentation

## 2021-10-02 ENCOUNTER — Ambulatory Visit (INDEPENDENT_AMBULATORY_CARE_PROVIDER_SITE_OTHER): Payer: Managed Care, Other (non HMO) | Admitting: Gastroenterology

## 2021-10-02 ENCOUNTER — Encounter: Payer: Self-pay | Admitting: Internal Medicine

## 2021-10-02 ENCOUNTER — Encounter: Payer: Self-pay | Admitting: Gastroenterology

## 2021-10-02 ENCOUNTER — Other Ambulatory Visit: Payer: Self-pay

## 2021-10-02 VITALS — BP 147/85 | HR 74 | Temp 97.5°F | Ht 73.0 in | Wt 264.6 lb

## 2021-10-02 DIAGNOSIS — R772 Abnormality of alphafetoprotein: Secondary | ICD-10-CM | POA: Diagnosis not present

## 2021-10-02 DIAGNOSIS — K219 Gastro-esophageal reflux disease without esophagitis: Secondary | ICD-10-CM

## 2021-10-02 DIAGNOSIS — K7010 Alcoholic hepatitis without ascites: Secondary | ICD-10-CM | POA: Diagnosis not present

## 2021-10-02 NOTE — Patient Instructions (Signed)
Please complete labs at [facility]. We will be in touch with results as available. Recommend NO alcohol. There is no safe amount for your liver. I will get together some resources for one on one counseling and send to your via Mychart.  Return office visit in four months.

## 2021-10-02 NOTE — Progress Notes (Signed)
Primary Care Physician: Erven Colla, DO  Primary Gastroenterologist:  Garfield Cornea, MD   Chief Complaint  Patient presents with   Cirrhosis    Just got out of rehab 9/8 and was in for 45 days. Was having daily ETOH use, now none in 2 days, mostly everyday last week    HPI: Bradley Miles is a 55 y.o. male here for follow-up.  He has a history of alcohol induced cirrhosis with portal hypertension and recurrent bleeding secondary to gastric varices requiring BRSO on multiple sessions along with prior TIPS placement.  Recurrent GI bleeding December 2021, arising from gastric varices.  Underwent balloon assisted coil assisted DRS oh/sclerotherapy of the posterior gastric varix. No recent issues issues with encephalopathy on lactulose and Xifaxan.  Went to inpatient rehab for 45 days, released September 8.  States he went 4 to 5 days without drinking but then started drinking alcohol on a daily basis.  States he quit again 36 hours ago.  Last week he started cutting back on alcohol consumption.  He had leftover Ativan he used for tremors.  Shakes have resolved.  Patient states has been going to [facility] on a daily basis since he was released from rehab.  He got COVID 2 weeks ago so had to stop going plans to start back now that he has been cleared.  He denies any abdominal pain, vomiting.  Reflux is well controlled.  Typically has 2-3 bowel movements per day.  No melena or rectal bleeding.  MRI liver March 2022 due to elevated AFP of 12.5: No focal liver lesions, splenomegaly, cholelithiasis, cirrhotic liver.  Hips present in the right hepatic vein.  Evidence of left upper quadrant coiling with gastroesophageal varices.  Due for labs at this time.  We will decide regarding further hepatoma screening based on results.  Since last office visit he completed colonoscopy as outlined below.  He will require surveillance exam in 3 years.  Colonoscopy July 2022: -Diverticulosis in the entire  examined colon. Colon lipoma. - Ten 3 to 8 mm polyps in the sigmoid colon and in the ascending colon, removed with a cold snare. Resected and retrieved. - The examination was otherwise normal on direct and retroflexion views. -Multiple tubular adenomas removed.  Next colonoscopy in 3 years.  EGD December 2021: -Normal esophagus. -Large volume of clotted blood and red blood in the cardia, in the gastric fundus and in the proximal gastric body that could not be cleared due to the volume of active bleeding and large size of the clots. Active proximal gastric bleeding, suspected bleeding gastric varices. - Normal duodenal bulb and second portion of the duodenum. - No specimens collected.   Current Outpatient Medications  Medication Sig Dispense Refill   acamprosate (CAMPRAL) 333 MG tablet Take 666 mg by mouth 3 (three) times daily.     allopurinol (ZYLOPRIM) 100 MG tablet Take 1 tablet (100 mg total) by mouth daily. 90 tablet 1   blood glucose meter kit and supplies Dispense based on patient and insurance preference. Test blood sugar once daily.  (FOR ICD-10 E10.9, E11.9). 1 each 5   Cholecalciferol (D3-1000) 25 MCG (1000 UT) capsule Take 1 capsule (1,000 Units total) by mouth daily. 90 capsule 3   dexlansoprazole (DEXILANT) 60 MG capsule Take 1 capsule (60 mg total) by mouth daily. 90 capsule 1   ferrous sulfate 325 (65 FE) MG EC tablet Take 1 tablet (325 mg total) by mouth 2 (two) times daily. [place]  tablet 3   folic acid (FOLVITE) 376 MCG tablet Take 400 mcg by mouth daily.     furosemide (LASIX) 40 MG tablet Take 0.5 tablets (20 mg total) by mouth daily. 45 tablet 1   gabapentin (NEURONTIN) 400 MG capsule Take 1 tab p.o. AM and noon, then take 2 tab qhs. 120 capsule 2   glucose blood (TRUE METRIX BLOOD GLUCOSE TEST) test strip Use as instructed 100 each 12   insulin detemir (LEVEMIR FLEXTOUCH) 100 UNIT/ML FlexPen Take 30 units qhs sq. 15 mL 1   insulin lispro (HUMALOG KWIKPEN) 100 UNIT/ML  KwikPen Take insulin as directed 3x per day with meals. 125 - 150 give 2 units of Humalog Insulin subq  151 - 200 give 4 units of Humalog Insulin subq  201 - 250 give 6 units of Humalog Insulin subq  251 - 300 give 8 units of Humalog Insulin subq  301 - 350 give 10 units of Humalog Insulin subq If over 350 call MD. 15 mL 2   Insulin Pen Needle (NOVOFINE) 30G X 8 MM MISC Inject 10 each into the skin as needed. 90 each 1   JANUVIA 100 MG tablet Take 100 mg by mouth every morning.     lactulose (CHRONULAC) 10 GM/15ML solution TAKE 15ML BY MOUTH THREE TIMES DAILY, TITRATE FOR 3 TO 4 SOFT BOWEL MOVEMENTS A DAY (Patient taking differently: TAKE 15ML BY MOUTH THREE TIMES DAILY, TITRATE FOR 3 TO 4 SOFT BOWEL MOVEMENTS A DAY. Not daily per pt) 946 mL 3   Lancets (ACTI-LANCE 28G) MISC Use 3x per day as directed. 200 each 3   Multiple Vitamin (MULTIVITAMIN WITH MINERALS) TABS tablet Take 1 tablet by mouth daily. 90 tablet 3   nadolol (CORGARD) 40 MG tablet TAKE 1 AND 1/2 TABLETS(60 MG) BY MOUTH DAILY 135 tablet 0   ondansetron (ZOFRAN-ODT) 4 MG disintegrating tablet DISSOLVE 1 TABLET ON THE TONGUE EVERY 8 HOURS AS NEEDED FOR NAUSEA 24 tablet 2   PARoxetine (PAXIL) 40 MG tablet Take 1 tablet (40 mg total) by mouth daily. 90 tablet 1   potassium chloride SA (KLOR-CON) 20 MEQ tablet Take 1 tablet (20 mEq total) by mouth daily as needed (Take when taking Lasix). 30 tablet 11   rifaximin (XIFAXAN) 550 MG TABS tablet Take 1 tablet (550 mg total) by mouth 2 (two) times daily. 60 tablet 11   sildenafil (VIAGRA) 100 MG tablet Take 0.5 tablets (50 mg total) by mouth as needed for erectile dysfunction. 30 tablet 2   silver sulfADIAZINE (SSD) 1 % cream Apply topically 2 (two) times daily. Apply to rt lower leg burn for 14 days. 400 g 1   sitaGLIPtin (JANUVIA) 100 MG tablet Take 1 tablet (100 mg total) by mouth daily. 90 tablet 1   spironolactone (ALDACTONE) 100 MG tablet TAKE 1 TABLET(100 MG) BY MOUTH DAILY 30 tablet 3    sucralfate (CARAFATE) 1 g tablet TAKE 1 TABLET BY MOUTH BEFORE MEALS 90 tablet 0   thiamine 100 MG tablet Take 1 tablet (100 mg total) by mouth daily. 90 tablet 1   No current facility-administered medications for this visit.    Allergies as of 10/02/2021   (No Known Allergies)    ROS:  General: Negative for anorexia, weight loss, fever, chills, fatigue, weakness. ENT: Negative for hoarseness, difficulty swallowing , nasal congestion. CV: Negative for chest pain, angina, palpitations, dyspnea on exertion, +peripheral edema.  Respiratory: Negative for dyspnea at rest, dyspnea on exertion, cough, sputum, wheezing.  GI:  See history of present illness. GU:  Negative for dysuria, hematuria, urinary incontinence, urinary frequency, nocturnal urination.  Endo: Negative for unusual weight change.    Physical Examination:   BP (!) 147/85   Pulse 74   Temp (!) 97.5 F (36.4 C)   Ht _0  (1.854 m)   Wt 264 lb 9.6 oz (120 kg)   BMI 34.91 kg/m   General: Well-nourished, well-developed in no acute distress.  Eyes: No icterus. Mouth: masked Lungs: Clear to auscultation bilaterally.  Heart: Regular rate and rhythm, no murmurs rubs or gallops.  Abdomen: Bowel sounds are normal, nontender, nondistended, no hepatosplenomegaly or masses, no abdominal bruits or hernia , no rebound or guarding.   Extremities: trace bilateral lower extremity edema. No clubbing or deformities. Neuro: Alert and oriented x 4   Skin: Warm and dry, no jaundice.   Psych: Alert and cooperative, normal mood and affect.  Labs:  Lab Results  Component Value Date   HGBA1C 8.7 (H) 06/29/2021   Lab Results  Component Value Date   ALT 18 06/29/2021   AST 51 (H) 06/29/2021   ALKPHOS 167 (H) 06/29/2021   BILITOT 1.8 (H) 06/29/2021   Lab Results  Component Value Date   WBC 4.5 06/16/2021   HGB 15.4 06/16/2021   HCT 45.1 06/16/2021   MCV 112.8 (H) 06/16/2021   PLT 71 (L) 06/16/2021   Lab Results  Component  Value Date   CREATININE 0.69 (L) 06/29/2021   BUN 3 (L) 06/29/2021   NA 136 06/29/2021   K 3.9 06/29/2021   CL 99 06/29/2021   CO2 23 06/29/2021     Imaging Studies: No results found.   Assessment:  55 y/o male with history of alcohol induced cirrhosis complication by remote hepatic encephalopathy, portal hypertensive gastropathy, bleeding gastric varices status post multiple sessions of BRSO with coiling/sclerotherapy with gastric varices previously, TIPS placement, presenting for follow-up.  Clinically no further bleeding noted, last admission December 2021.  Recently got out of inpatient rehab for alcohol abuse last month, stayed for 45 days.  Unfortunately patient started drinking again, 4 to 5 days after he was released.  He has been going to AA daily until he tested positive for COVID two weeks ago. He desires to quit drinking. States he has been in inpatient rehab about four times. He is interested in additional outpatient resources and would benefit from addiction related behavioral medicine counseling. He is due for labs and hepatoma screening (will hold off for AFP results before deciding if MRI vs U/S with check for TIPS patency).   GERD is well controlled on Dexilant.   Personal history of adenomatous colon polyps, colonoscopy due in 06/2024.      Plan: Continue Dexilant 79m daily. Continue lactulose with goal of 3-4 stools daily, continue Xifaxan 5536mbid.  Continue nadolol, lasix, aldactone. Needs to restrain from etoh use. Will check into referral for counseling.  Update labs today.  Ov in four months.

## 2021-10-04 ENCOUNTER — Other Ambulatory Visit: Payer: Self-pay | Admitting: Interventional Radiology

## 2021-10-04 DIAGNOSIS — I8501 Esophageal varices with bleeding: Secondary | ICD-10-CM

## 2021-10-05 LAB — AFP TUMOR MARKER: AFP, Serum, Tumor Marker: 13.3 ng/mL — ABNORMAL HIGH (ref 0.0–8.4)

## 2021-10-05 LAB — COMPREHENSIVE METABOLIC PANEL
ALT: 32 IU/L (ref 0–44)
AST: 76 IU/L — ABNORMAL HIGH (ref 0–40)
Albumin/Globulin Ratio: 1 — ABNORMAL LOW (ref 1.2–2.2)
Albumin: 3.7 g/dL — ABNORMAL LOW (ref 3.8–4.9)
Alkaline Phosphatase: 141 IU/L — ABNORMAL HIGH (ref 44–121)
BUN/Creatinine Ratio: 10 (ref 9–20)
BUN: 8 mg/dL (ref 6–24)
Bilirubin Total: 2.7 mg/dL — ABNORMAL HIGH (ref 0.0–1.2)
CO2: 21 mmol/L (ref 20–29)
Calcium: 9.4 mg/dL (ref 8.7–10.2)
Chloride: 100 mmol/L (ref 96–106)
Creatinine, Ser: 0.81 mg/dL (ref 0.76–1.27)
Globulin, Total: 3.7 g/dL (ref 1.5–4.5)
Glucose: 110 mg/dL — ABNORMAL HIGH (ref 70–99)
Potassium: 4.6 mmol/L (ref 3.5–5.2)
Sodium: 140 mmol/L (ref 134–144)
Total Protein: 7.4 g/dL (ref 6.0–8.5)
eGFR: 106 mL/min/{1.73_m2} (ref >59)

## 2021-10-05 LAB — PROTIME-INR

## 2021-10-05 LAB — CBC WITH DIFFERENTIAL/PLATELET

## 2021-10-07 ENCOUNTER — Encounter (HOSPITAL_COMMUNITY): Payer: Self-pay | Admitting: *Deleted

## 2021-10-07 ENCOUNTER — Emergency Department (HOSPITAL_COMMUNITY)
Admission: EM | Admit: 2021-10-07 | Discharge: 2021-10-07 | Disposition: A | Discharge status: Home / Self Care | Payer: Managed Care, Other (non HMO) | Attending: Emergency Medicine | Admitting: Emergency Medicine

## 2021-10-07 DIAGNOSIS — F17210 Nicotine dependence, cigarettes, uncomplicated: Secondary | ICD-10-CM | POA: Diagnosis not present

## 2021-10-07 DIAGNOSIS — J449 Chronic obstructive pulmonary disease, unspecified: Secondary | ICD-10-CM | POA: Insufficient documentation

## 2021-10-07 DIAGNOSIS — Y908 Blood alcohol level of 240 mg/100 ml or more: Secondary | ICD-10-CM | POA: Insufficient documentation

## 2021-10-07 DIAGNOSIS — R109 Unspecified abdominal pain: Secondary | ICD-10-CM | POA: Insufficient documentation

## 2021-10-07 DIAGNOSIS — R140 Abdominal distension (gaseous): Secondary | ICD-10-CM | POA: Diagnosis not present

## 2021-10-07 DIAGNOSIS — I10 Essential (primary) hypertension: Secondary | ICD-10-CM | POA: Diagnosis not present

## 2021-10-07 DIAGNOSIS — Z79899 Other long term (current) drug therapy: Secondary | ICD-10-CM | POA: Diagnosis not present

## 2021-10-07 DIAGNOSIS — R1011 Right upper quadrant pain: Secondary | ICD-10-CM | POA: Insufficient documentation

## 2021-10-07 DIAGNOSIS — D696 Thrombocytopenia, unspecified: Secondary | ICD-10-CM | POA: Diagnosis not present

## 2021-10-07 DIAGNOSIS — Z794 Long term (current) use of insulin: Secondary | ICD-10-CM | POA: Insufficient documentation

## 2021-10-07 DIAGNOSIS — Z7984 Long term (current) use of oral hypoglycemic drugs: Secondary | ICD-10-CM | POA: Insufficient documentation

## 2021-10-07 DIAGNOSIS — F1010 Alcohol abuse, uncomplicated: Secondary | ICD-10-CM | POA: Diagnosis not present

## 2021-10-07 DIAGNOSIS — R7401 Elevation of levels of liver transaminase levels: Secondary | ICD-10-CM | POA: Insufficient documentation

## 2021-10-07 DIAGNOSIS — R001 Bradycardia, unspecified: Secondary | ICD-10-CM | POA: Diagnosis not present

## 2021-10-07 LAB — CBC WITH DIFFERENTIAL/PLATELET
Abs Immature Granulocytes: 0.01 10*3/uL (ref 0.00–0.07)
Basophils Absolute: 0.1 10*3/uL (ref 0.0–0.1)
Basophils Relative: 2 %
Eosinophils Absolute: 0.3 10*3/uL (ref 0.0–0.5)
Eosinophils Relative: 7 %
HCT: 47.9 % (ref 39.0–52.0)
Hemoglobin: 16.4 g/dL (ref 13.0–17.0)
Immature Granulocytes: 0 %
Lymphocytes Relative: 29 %
Lymphs Abs: 1.2 10*3/uL (ref 0.7–4.0)
MCH: 37.4 pg — ABNORMAL HIGH (ref 26.0–34.0)
MCHC: 34.2 g/dL (ref 30.0–36.0)
MCV: 109.1 fL — ABNORMAL HIGH (ref 80.0–100.0)
Monocytes Absolute: 0.6 10*3/uL (ref 0.1–1.0)
Monocytes Relative: 13 %
Neutro Abs: 2 10*3/uL (ref 1.7–7.7)
Neutrophils Relative %: 49 %
Platelets: 139 10*3/uL — ABNORMAL LOW (ref 150–400)
RBC: 4.39 MIL/uL (ref 4.22–5.81)
RDW: 14.4 % (ref 11.5–15.5)
WBC: 4.2 10*3/uL (ref 4.0–10.5)
nRBC: 0 % (ref 0.0–0.2)

## 2021-10-07 LAB — COMPREHENSIVE METABOLIC PANEL
ALT: 49 U/L — ABNORMAL HIGH (ref 0–44)
AST: 105 U/L — ABNORMAL HIGH (ref 15–41)
Albumin: 3.5 g/dL (ref 3.5–5.0)
Alkaline Phosphatase: 124 U/L (ref 38–126)
Anion gap: 12 (ref 5–15)
BUN: 7 mg/dL (ref 6–20)
CO2: 28 mmol/L (ref 22–32)
Calcium: 8.9 mg/dL (ref 8.9–10.3)
Chloride: 100 mmol/L (ref 98–111)
Creatinine, Ser: 0.75 mg/dL (ref 0.61–1.24)
GFR, Estimated: >60 mL/min (ref >60)
Glucose, Bld: 137 mg/dL — ABNORMAL HIGH (ref 70–99)
Potassium: 3.7 mmol/L (ref 3.5–5.1)
Sodium: 140 mmol/L (ref 135–145)
Total Bilirubin: 1.9 mg/dL — ABNORMAL HIGH (ref 0.3–1.2)
Total Protein: 8.3 g/dL — ABNORMAL HIGH (ref 6.5–8.1)

## 2021-10-07 LAB — ETHANOL: Alcohol, Ethyl (B): 391 mg/dL (ref <10)

## 2021-10-07 LAB — AMMONIA: Ammonia: 35 umol/L (ref 9–35)

## 2021-10-07 LAB — LIPASE, BLOOD: Lipase: 50 U/L (ref 11–51)

## 2021-10-07 LAB — PROTIME-INR
INR: 1.3 — ABNORMAL HIGH (ref 0.8–1.2)
Prothrombin Time: 16 seconds — ABNORMAL HIGH (ref 11.4–15.2)

## 2021-10-07 NOTE — ED Notes (Signed)
Pt refuses vital signs.

## 2021-10-07 NOTE — ED Triage Notes (Signed)
Abdominal pain with confusion onset last night, history of cirrhosis

## 2021-10-07 NOTE — ED Notes (Signed)
Pt brought back to room with wife. Before being able to assess pt, pt left. Per  the pts wife he is drunk and refuses tx. Pt was c/o of abominal pain but told the wife he wants no treatment at this time. Pts wife and daughter left facility at this time and pt eloped.

## 2021-10-07 NOTE — ED Notes (Signed)
Pts wife stated she wanted pt to have something to eat or drink. Informed the pt this nurse would talk to the provider and make sure that was ok. Pts wife states " I am not asking for your permission because I will do whatever it takes to save his fucking life". This nurse told the wife she could do what she felt necessary but we advised against eating and drinking until all testing has been completed.

## 2021-10-07 NOTE — Discharge Instructions (Addendum)
You were seen in the emergency department today at the instruction of your family due to alcohol use.  While you are here your labs show that you have expected changes to your liver from chronic alcohol use.  You also have an elevated alcohol level which you are aware of.  Your ammonia level was normal.  I have given you a referral to [facility] recovery services here in [place].  Please use them for rehab and recovery from alcohol use.  You may also return to the emergency department for any reason.

## 2021-10-07 NOTE — ED Notes (Signed)
Pt refused vital signs and signature

## 2021-10-07 NOTE — ED Notes (Signed)
Pts wife not in the room with him. Pt is intoxicated and refusing to stay in bed. Pt took Cardiac monitor off the wall and threw it onto the counter. Pt states" I did it because I don't like the sound of it". Requested pt to please not mess with the medical equipment in the room. Pt agreed to sit in a chair. This nurse left the door open to monitor pt more closely.

## 2021-10-07 NOTE — ED Notes (Signed)
Pt vapping in the room. Requested to pt to not vape and leave the door open so we can monitor him as he is intoxicated and a fall risk. Pt refuses. Security at bedside.

## 2021-10-07 NOTE — ED Provider Notes (Signed)
[facility] EMERGENCY DEPARTMENT Provider Note   CSN: [id number] Arrival date & time: 10/07/21  1500     History Chief Complaint  Patient presents with   Abdominal Pain    Bradley Miles is a 55 y.o. male.  With past medical history of type 2 diabetes, alcoholic cirrhosis with history of TIPS procedure, esophageal varices who presents to the emergency department at the instruction of his family who states that he has intermittent confusion and abdominal pain.  On my exam the patient is saying that he would like to leave.  He is alert and oriented.  He does tell me that he has been drinking a gallon of vodka a day for years.  Last drink was 30 minutes prior to arrival.  He states he would like to leave so he can continue to drink.  He endorses headache, tremors and anxiety.  He does endorse history of esophageal varices and GI bleeding with history of TIPS procedure 1.5 years ago.  He states he has not had any GI bleeding since procedure.  Denies recent fevers.  Denies history of alcohol withdrawal seizure.   Abdominal Pain     Past Medical History:  Diagnosis Date   Anxiety    Controlled type 2 diabetes mellitus with complication, without long-term current use of insulin ([place]) 06/26/2020   Enlarged liver    GERD (gastroesophageal reflux disease)    Hypertension    Palpitations    PVC's (premature ventricular contractions)    Shortness of breath    Tussive syncope 12/22/2019   Varicose veins     Patient Active Problem List   Diagnosis Date Noted   Elevated AFP 10/02/2021   S/P alcohol detoxification 08/24/2021   Depression 08/24/2021   Hematemesis 11/15/2020   GIB (gastrointestinal bleeding) 08/05/2020   Acute GI bleeding 08/05/2020   Gastric varices    Thrombocytopenia ([place]) 06/26/2020   Uncontrolled diabetes mellitus 06/26/2020   Tussive syncope 12/22/2019   Acute blood loss anemia    Alcohol dependence with inpatient treatment [facility])    Bleeding esophageal varices  ([place])    Hematemesis/vomiting blood 11/20/2019   RUQ pain 07/30/2019   Abnormal LFTs 07/30/2019   Bilateral leg edema 06/03/2019   Portal hypertension ([place]) 11/26/2018   Hepatic steatosis 06/27/2018   Abdominal pain    Alcoholic hepatitis without ascites    Acute hepatic encephalopathy 06/01/2018   Acute respiratory failure with hypoxia ([place]) 17/51/0258   Alcoholic cirrhosis of liver with ascites ([place]) 06/01/2018   COPD with acute exacerbation ([place]) 52/77/8242   Alcoholic peripheral neuropathy ([place]) 06/01/2018   Increased ammonia level    COPD, mild ([place]) 05/14/2017   Right leg claudication ([place]) 05/14/2017   Prediabetes 02/04/2017   Chest pain 12/21/2015   Right leg DVT ([place]) 07/18/2015   Elevated transaminase measurement 07/18/2015   Sensory neuropathy 07/16/2015   Anxiety 12/23/2014   EtOH dependence ([place]) 02/09/2014   Headache in back of head 01/12/2014   Superficial thrombosis of lower extremity 07/01/2013   Hematochezia 11/17/2012   Mixed hyperlipidemia 02/15/2012   Hypertension 01/18/2012   Insomnia 01/18/2012   Family history of colonic polyps 11/15/2011   Palpitations 10/04/2011   TOBACCO ABUSE 05/18/2010   GERD 05/18/2010   CONSTIPATION 05/18/2010   ERECTILE DYSFUNCTION, 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 EGD with mild erosive reflux esophagitis, small hiatal hernia, negative H.pylori   COLONOSCOPY WITH PROPOFOL N/A 06/22/2021   Procedure: COLONOSCOPY WITH PROPOFOL;  Surgeon: Daneil Dolin, MD;  [facility];  Service: Endoscopy;  Laterality: N/A;  1:45pm    cyst reomved     from spine   ESOPHAGOGASTRODUODENOSCOPY  05/19/09   RMR: Geographic distal esophageal erosions consistent with severe erosive reflux esophagitis. schatzki's ring s/P dilation/small hiatal hernia otherwise normal stomach   ESOPHAGOGASTRODUODENOSCOPY (EGD) WITH PROPOFOL N/A 02/02/2019   Dr. Gala Romney: retained gastric contents. portal HTN gastropathy   ESOPHAGOGASTRODUODENOSCOPY (EGD) WITH PROPOFOL N/A 11/20/2019   Procedure: ESOPHAGOGASTRODUODENOSCOPY (EGD) WITH PROPOFOL;  Surgeon: Daneil Dolin, MD;  [facility];  Service: Endoscopy;  Laterality: N/A;   ESOPHAGOGASTRODUODENOSCOPY (EGD) WITH PROPOFOL N/A 11/24/2019   Procedure: ESOPHAGOGASTRODUODENOSCOPY (EGD) WITH PROPOFOL;  Surgeon: Ronnette Juniper, MD;  Location: [place];  Service: Gastroenterology;  Laterality: N/A;   ESOPHAGOGASTRODUODENOSCOPY (EGD) WITH PROPOFOL N/A 08/08/2020   Procedure: ESOPHAGOGASTRODUODENOSCOPY (EGD) WITH PROPOFOL;  Surgeon: Daneil Dolin, MD;  [facility];  Service: Endoscopy;  Laterality: N/A;   ESOPHAGOGASTRODUODENOSCOPY (EGD) WITH PROPOFOL N/A 11/15/2020   Procedure: ESOPHAGOGASTRODUODENOSCOPY (EGD) WITH PROPOFOL;  Surgeon: Ladene Artist, MD;  Location: [facility] ENDOSCOPY;  Service: Endoscopy;  Laterality: N/A;   IR ANGIOGRAM SELECTIVE EACH ADDITIONAL VESSEL  11/22/2019   IR ANGIOGRAM SELECTIVE EACH ADDITIONAL VESSEL  11/24/2019   IR ANGIOGRAM SELECTIVE EACH ADDITIONAL VESSEL  11/24/2019   IR ANGIOGRAM SELECTIVE EACH ADDITIONAL VESSEL  11/24/2019   IR ANGIOGRAM SELECTIVE EACH ADDITIONAL VESSEL  11/15/2020   IR EMBO ART  VEN HEMORR LYMPH EXTRAV  INC GUIDE ROADMAPPING  11/22/2019   IR EMBO ART  VEN HEMORR LYMPH EXTRAV  INC GUIDE ROADMAPPING  11/24/2019   IR EMBO ART  VEN HEMORR LYMPH EXTRAV  INC GUIDE ROADMAPPING  11/15/2020   IR FLUORO GUIDE CV LINE RIGHT  11/15/2020   IR RADIOLOGIST EVAL & MGMT  12/29/2019   IR RADIOLOGIST EVAL & MGMT  10/27/2020   IR RADIOLOGIST EVAL & MGMT   04/19/2021   IR TIPS  11/24/2019   IR US GUIDE VASC ACCESS RIGHT  11/22/2019   IR US GUIDE VASC ACCESS RIGHT  11/15/2020   IR US GUIDE VASC ACCESS RIGHT  11/15/2020   IR VENOGRAM RENAL UNI LEFT  11/22/2019   IR VENOGRAM RENAL UNI LEFT  11/15/2020   POLYPECTOMY  06/22/2021   Procedure: POLYPECTOMY;  Surgeon: Daneil Dolin, MD;  [facility];  Service: Endoscopy;;   RADIOLOGY WITH ANESTHESIA N/A 11/24/2019   Procedure: IR WITH ANESTHESIA;  Surgeon: Radiologist, Medication, MD;  Location: [place];  Service: Radiology;  Laterality: N/A;   RADIOLOGY WITH ANESTHESIA N/A 11/15/2020   Procedure: IR WITH ANESTHESIA;  Surgeon: Suzette Battiest, MD;  Location: [place];  Service: Radiology;  Laterality: N/A;   TIPS PROCEDURE N/A 11/22/2019   Procedure: BRTO/TRANS-JUGULAR INTRAHEPATIC PORTAL SHUNT (TIPS);  Surgeon: Corrie Mckusick, DO;  Location: [place];  Service: Anesthesiology;  Laterality: N/A;   WISDOM TOOTH EXTRACTION        Family History  Problem Relation Age of Onset   Cancer Mother        ? etiology   Heart disease Mother    Colon polyps Sister 23  two sisters   Brain cancer Father    Liver disease Neg Hx     Social History   Tobacco Use   Smoking status: Every Day    Packs/day: 1.00    Years: 30.00    Pack years: 30.00    Types: Cigarettes    Start date: 12/24/1979   Smokeless tobacco: Never  Vaping Use   Vaping Use: Never used  Substance Use Topics   Alcohol use: Yes    Alcohol/week: 0.0 standard drinks    Comment: 05/09/21 "6 coolers/day"   Drug use: No    Home Medications Prior to Admission medications   Medication Sig Start Date End Date Taking? Authorizing Provider  acamprosate (CAMPRAL) 333 MG tablet Take 666 mg by mouth 3 (three) times daily. 08/01/21   [provider]  allopurinol (ZYLOPRIM) 100 MG tablet Take 1 tablet (100 mg total) by mouth daily. 08/23/21   Elvia Collum M, DO  blood glucose meter kit and supplies Dispense based on patient and  insurance preference. Test blood sugar once daily.  (FOR ICD-10 E10.9, E11.9). 04/21/20   Erven Colla, DO  Cholecalciferol (D3-1000) 25 MCG (1000 UT) capsule Take 1 capsule (1,000 Units total) by mouth daily. 11/23/20   Carlis Stable, NP  dexlansoprazole (DEXILANT) 60 MG capsule Take 1 capsule (60 mg total) by mouth daily. 08/23/21   Elvia Collum M, DO  ferrous sulfate 325 (65 FE) MG EC tablet Take 1 tablet (325 mg total) by mouth 2 (two) times daily. 08/08/20 10/02/21  Roxan Hockey, MD  folic acid (FOLVITE) 081 MCG tablet Take 400 mcg by mouth daily.    [provider]  furosemide (LASIX) 40 MG tablet Take 0.5 tablets (20 mg total) by mouth daily. 08/23/21   Elvia Collum M, DO  gabapentin (NEURONTIN) 400 MG capsule Take 1 tab p.o. AM and noon, then take 2 tab qhs. 08/23/21   Elvia Collum M, DO  glucose blood (TRUE METRIX BLOOD GLUCOSE TEST) test strip Use as instructed 08/23/21   Elvia Collum M, DO  insulin detemir (LEVEMIR FLEXTOUCH) 100 UNIT/ML FlexPen Take 30 units qhs sq. 08/23/21   Lovena Le, Malena M, DO  insulin lispro (HUMALOG KWIKPEN) 100 UNIT/ML KwikPen Take insulin as directed 3x per day with meals. 125 - 150 give 2 units of Humalog Insulin subq  151 - 200 give 4 units of Humalog Insulin subq  201 - 250 give 6 units of Humalog Insulin subq  251 - 300 give 8 units of Humalog Insulin subq  301 - 350 give 10 units of Humalog Insulin subq If over 350 call MD. 08/23/21   Elvia Collum M, DO  Insulin Pen Needle (NOVOFINE) 30G X 8 MM MISC Inject 10 each into the skin as needed. 08/23/21   Lovena Le, Malena M, DO  JANUVIA 100 MG tablet Take 100 mg by mouth every morning. 08/14/21   [provider]  lactulose (CHRONULAC) 10 GM/15ML solution TAKE 15ML BY MOUTH THREE TIMES DAILY, TITRATE FOR 3 TO 4 SOFT BOWEL MOVEMENTS A DAY Patient taking differently: TAKE 15ML BY MOUTH THREE TIMES DAILY, TITRATE FOR 3 TO 4 SOFT BOWEL MOVEMENTS A DAY. Not daily per pt 05/02/21   Annitta Needs,  NP  Lancets (ACTI-LANCE 28G) MISC Use 3x per day as directed. 08/23/21   Erven Colla, DO  Multiple Vitamin (MULTIVITAMIN WITH MINERALS) TABS tablet Take 1 tablet by mouth daily. 11/23/20   Carlis Stable, NP  nadolol (CORGARD) 40 MG tablet TAKE  1 AND 1/2 TABLETS(60 MG) BY MOUTH DAILY 01/24/21   Ahmed Prima, [place] M, PA-C  ondansetron (ZOFRAN-ODT) 4 MG disintegrating tablet DISSOLVE 1 TABLET ON THE TONGUE EVERY 8 HOURS AS NEEDED FOR NAUSEA 01/20/20   Annitta Needs, NP  PARoxetine (PAXIL) 40 MG tablet Take 1 tablet (40 mg total) by mouth daily. 08/23/21   Elvia Collum M, DO  potassium chloride SA (KLOR-CON) 20 MEQ tablet Take 1 tablet (20 mEq total) by mouth daily as needed (Take when taking Lasix). 08/08/20   Roxan Hockey, MD  rifaximin (XIFAXAN) 550 MG TABS tablet Take 1 tablet (550 mg total) by mouth 2 (two) times daily. 11/10/20   Carlis Stable, NP  sildenafil (VIAGRA) 100 MG tablet Take 0.5 tablets (50 mg total) by mouth as needed for erectile dysfunction. 04/19/21   Elvia Collum M, DO  silver sulfADIAZINE (SSD) 1 % cream Apply topically 2 (two) times daily. Apply to rt lower leg burn for 14 days. 04/18/21   Lovena Le, Malena M, DO  sitaGLIPtin (JANUVIA) 100 MG tablet Take 1 tablet (100 mg total) by mouth daily. 08/23/21   Elvia Collum M, DO  spironolactone (ALDACTONE) 100 MG tablet TAKE 1 TABLET(100 MG) BY MOUTH DAILY 06/01/21   Annitta Needs, NP  sucralfate (CARAFATE) 1 g tablet TAKE 1 TABLET BY MOUTH BEFORE MEALS 07/11/21   Elvia Collum M, DO  thiamine 100 MG tablet Take 1 tablet (100 mg total) by mouth daily. 11/23/20   Carlis Stable, NP    Allergies    Patient has no known allergies.  Review of Systems   Review of Systems  Gastrointestinal:  Positive for abdominal pain.  Neurological:  Positive for tremors and headaches.  Psychiatric/Behavioral:  Positive for behavioral problems.   All other systems reviewed and are negative.  Physical Exam Updated Vital Signs BP 104/68 (BP Location:  Right Arm)   Pulse (!) 57   Temp (!) 97.3 F (36.3 C) (Oral)   Resp 18   SpO2 93%   Physical Exam Vitals and nursing note reviewed.  Constitutional:      General: He is not in acute distress.    Appearance: Normal appearance. He is well-developed. He is not toxic-appearing.  HENT:     Head: Normocephalic and atraumatic.     Nose: Nose normal.     Mouth/Throat:     Mouth: Mucous membranes are dry.     Pharynx: Oropharynx is clear.  Eyes:     General: No scleral icterus.    Extraocular Movements: Extraocular movements intact.     Pupils: Pupils are equal, round, and reactive to light.  Cardiovascular:     Rate and Rhythm: Regular rhythm. Bradycardia present.     Heart sounds: Normal heart sounds. No murmur heard. Pulmonary:     Effort: Pulmonary effort is normal. No respiratory distress.     Breath sounds: Normal breath sounds.  Abdominal:     General: Abdomen is protuberant. Bowel sounds are normal. There is distension.     Palpations: Abdomen is soft.     Tenderness: There is abdominal tenderness in the right upper quadrant.  Musculoskeletal:     Cervical back: Normal range of motion and neck supple.  Skin:    General: Skin is warm and dry.     Capillary Refill: Capillary refill takes less than 2 seconds.  Neurological:     General: No focal deficit present.     Mental Status: He is alert and oriented to person, place,  and time. Mental status is at baseline.     GCS: GCS eye subscore is 4. GCS verbal subscore is 5. GCS motor subscore is 6.     Cranial Nerves: Cranial nerves 2-12 are intact.     Motor: Motor function is intact.     Gait: Gait is intact.     Comments: He is alert and oriented to person, place, year, president. There are no focal neurological deficits.  He does appear to have some ataxia, likely from chronic alcohol use. No gross tremors on exam and he is not diaphoretic, hallucinating or agitated.  Psychiatric:        Attention and Perception: Attention  and perception normal. He does not perceive auditory or visual hallucinations.        Mood and Affect: Mood normal.        Speech: Speech normal.        Behavior: Behavior normal.        Thought Content: Thought content normal. Thought content does not include homicidal or suicidal ideation.        Cognition and Memory: Cognition normal.        Judgment: Judgment is impulsive.    ED Results / Procedures / Treatments   Labs (all labs ordered are listed, but only abnormal results are displayed) Labs Reviewed  COMPREHENSIVE METABOLIC PANEL - Abnormal; Notable for the following components:      Result Value   Glucose, Bld 137 (*)    Total Protein 8.3 (*)    AST 105 (*)    ALT 49 (*)    Total Bilirubin 1.9 (*)    All other components within normal limits  ETHANOL - Abnormal; Notable for the following components:   Alcohol, Ethyl (B) 391 (*)    All other components within normal limits  CBC WITH DIFFERENTIAL/PLATELET - Abnormal; Notable for the following components:   MCV 109.1 (*)    MCH 37.4 (*)    Platelets 139 (*)    All other components within normal limits  PROTIME-INR - Abnormal; Notable for the following components:   Prothrombin Time 16.0 (*)    INR 1.3 (*)    All other components within normal limits  LIPASE, BLOOD  AMMONIA   EKG None  Radiology No results found.  Procedures Procedures   Medications Ordered in ED Medications - No data to display  ED Course  I have reviewed the triage vital signs and the nursing notes.  Pertinent labs & imaging results that were available during my care of the patient were reviewed by me and considered in my medical decision making (see chart for details).    MDM Rules/Calculators/A&P 55 year old male who presents to the emergency department with family with concern for alcohol use disorder.  Patient initially eloped but returned and agreed to have blood drawn.  He has no complaints and his physical exam is as noted  above. Labs are consistent with chronic alcohol use.  He has macrocytosis without anemia.  Thrombocytopenia to 139 without evidence of bleeding on exam or history. AST and ALT are elevated with AST 2 times ALT consistent with chronic alcohol use. EtOH 391 Ammonia 35, lipase 50  Wife of patient is the one who urged him to come to the emergency department.  She was concerned that his ammonia was elevated.  Is not here in the emergency department and I have talked with her at bedside about the results of his lab work.  The patient is alert and  oriented and able to make decisions on his own.  He does not wish to stay.  He is requesting alcohol detox.  I have provided him with instructions to present to [facility] in [place] for ongoing management of his alcohol use.  There is no emergent needs to be addressed at this time.  Vitals are stable, he ambulates without assistance and is tolerating p.o. Will discharge Final Clinical Impression(s) / ED Diagnoses Final diagnoses:  Alcohol abuse    Rx / DC Orders ED Discharge Orders     None        Mickie Hillier, PA-C 10/07/21 1916    M[place], MD 10/07/21 2308

## 2021-10-07 NOTE — ED Notes (Signed)
Pt rearrived to room 19. Pt did not leave facility but states he stepped outside and is willing to stay for treatment at this time.

## 2021-10-12 ENCOUNTER — Encounter: Payer: Self-pay | Admitting: Gastroenterology

## 2021-10-17 ENCOUNTER — Telehealth: Payer: Self-pay | Admitting: Internal Medicine

## 2021-10-17 NOTE — Telephone Encounter (Signed)
Noted. Recall mailed

## 2021-10-17 NOTE — Telephone Encounter (Signed)
See result note from 10/02/21 labs. He needs RUQ U/S for elevated AFP but Tammy had to send letter.

## 2021-10-17 NOTE — Telephone Encounter (Signed)
Recall for ultrasound 

## 2021-10-17 NOTE — Telephone Encounter (Signed)
I don't see any note in last OV with Magda Paganini 10/02/21. He is scheduled for upcoming [place] with IR. Please review leslie. thanks

## 2021-10-19 ENCOUNTER — Other Ambulatory Visit: Payer: Self-pay | Admitting: Family Medicine

## 2021-11-01 ENCOUNTER — Other Ambulatory Visit: Payer: Managed Care, Other (non HMO)

## 2021-11-01 ENCOUNTER — Inpatient Hospital Stay: Admission: RE | Admit: 2021-11-01 | Payer: Managed Care, Other (non HMO) | Source: Ambulatory Visit

## 2021-11-04 ENCOUNTER — Other Ambulatory Visit: Payer: Self-pay | Admitting: Student

## 2022-02-09 ENCOUNTER — Other Ambulatory Visit: Payer: Self-pay | Admitting: Family Medicine

## 2022-02-09 DIAGNOSIS — F32A Depression, unspecified: Secondary | ICD-10-CM

## 2022-02-09 DIAGNOSIS — G621 Alcoholic polyneuropathy: Secondary | ICD-10-CM

## 2022-02-09 NOTE — Telephone Encounter (Signed)
Sent my chart message to schedule appointment 02/09/22 ?

## 2022-02-10 ENCOUNTER — Other Ambulatory Visit: Payer: Self-pay | Admitting: Gastroenterology

## 2022-02-12 NOTE — Telephone Encounter (Signed)
Sent 2nd reminder to schedule medication follow up 02/12/22 ?

## 2022-02-22 NOTE — Telephone Encounter (Signed)
Moving records to new provider ?

## 2022-02-26 ENCOUNTER — Ambulatory Visit: Payer: Managed Care, Other (non HMO) | Admitting: Gastroenterology

## 2022-03-30 ENCOUNTER — Other Ambulatory Visit: Payer: Self-pay | Admitting: Nurse Practitioner

## 2022-03-30 NOTE — Telephone Encounter (Signed)
Pt lives in [place] right now and does not need meds  ?

## 2022-07-19 ENCOUNTER — Other Ambulatory Visit: Payer: Self-pay | Admitting: Nurse Practitioner

## 2022-07-19 DIAGNOSIS — K766 Portal hypertension: Secondary | ICD-10-CM

## 2022-07-19 DIAGNOSIS — K7682 Hepatic encephalopathy: Secondary | ICD-10-CM

## 2022-07-19 DIAGNOSIS — K7031 Alcoholic cirrhosis of liver with ascites: Secondary | ICD-10-CM

## 2022-07-20 NOTE — Telephone Encounter (Signed)
He is overdue for ov.

## 2022-07-23 ENCOUNTER — Telehealth: Payer: Self-pay | Admitting: Internal Medicine

## 2022-07-23 NOTE — Telephone Encounter (Signed)
Pt is scheduled an OV to further refills, his wife is saying he is coming home from a treatment facility in [place] and needs to have a para done the week on 8/28. I offered her OV for 8/31 but she has so many questions about getting him scheduled a para that I told her that I would have a nurse call her back. Please advise what I need to do. [phone]

## 2022-07-24 NOTE — Telephone Encounter (Signed)
Spoke with patient's wife and informed her that patient would need to be seen before we could set him up for a para. Patient has been scheduled to see [name] on 08/07/22 for follow up for cirrhosis and to schedule para. Bradley Miles.

## 2022-08-06 ENCOUNTER — Encounter: Payer: Self-pay | Admitting: Gastroenterology

## 2022-08-06 NOTE — Progress Notes (Unsigned)
GI Office Note    Referring Provider: Erven Colla, DO Primary Care Physician:  Erven Colla, DO Primary Gastroenterologist: Dr. Gala Romney  Date:  08/07/2022  ID:  Bradley Miles, DOB 08-20-1966, MRN [id number]   Chief Complaint   Chief Complaint  Patient presents with   Cirrhosis    Needs a paracentesis. Has been getting them done every week in [place].     History of Present Illness  Bradley Miles is a 56 y.o. male with a history of alcoholic cirrhosis with ascites, portal hypertension, recurrent bleeding secondary to gastric varices requiring variceal multiple sessions of treatment along with prior TIPs procedure, diabetes, anxiety, GERD, HTN presenting today for follow up of cirrhosis.  Last seen in the office in October 2022 for his reported patient went to an inpatient rehab for 45 days but after 4 to 5 days of being home he started back drinking again and reportedly was going to [employer].  He reported he was having 2-3 bowel movements per day and had no recent issues with encephalopathy as he is maintained on lactulose and Xifaxan.  He was to have ultrasound to assess TIPS procedure, advised right upper quadrant ultrasound for hepatoma screening, repeat AFP, PT/INR, CMET in 3 months.  MRI liver in March 2022 due to elevated AFP of 12.5: No focal liver lesions, splenomegaly, cholelithiasis, cirrhotic liver.  TIPS present in the right hepatic vein, evidence of left upper quadrant coiling with gastroesophageal varices.  Colonoscopy July 2022: Diverticulosis in the entire examined colon, colon lipoma, ten 3-8 mm polyps in the sigmoid and descending colon with pathology consistent for tubular adenomas.  Advise repeat in 3 years (2025).  EGD December 2021: Normal esophagus, large volume of clotted blood and red blood in the cardia, gastric fundus and in proximal gastric body that could not be cleared due to volume of active bleeding and large size clots, normal duodenum. Source  of bleeding not located.   Patient had an admission at [facility] in [place] in January 2023 in which he had right upper quadrant ultrasound revealing no flow detected within the TIPS shunt, cholelithiasis with gallbladder wall thickening which may be related to presence of ascites.  He had ultrasound paracentesis on 01/08/22 with 4 L of clear yellow fluid evacuated, paracentesis on 01/11/2022 was 6.3 L clear yellow fluid evacuated.  New TIPS obtained on 01/12/2022.  It appears that within days after repeat TIPS that was ultimately unsuccessful, noted to have poor flow through the shunt and was given instructions to establish with hepatology for evaluation of liver transplant.  Per visit note from 03/26/2022 at [facility] he has had recurrent admissions for ascites and encephalopathy.  It was reported that since his TIPS procedure he had been hospitalized 4 times for repeat paracentesis in which each time they removed approximately 10 L and testing is always been negative for SBP and he has never been on prophylactic antibiotics.  He reportedly is taking lactulose and rifaximin 3 times daily, reporting 2-3 bowel movements daily that are typically soft and loose.  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  Reported that [facility] he reported things were going well for him.  Patient was hoping to set up schedule paracenteses and reported discomfort with eating large meals.  He reportedly was currently living [facility] with his dog stating that his wife and the rest of his family continue to reside here in [place].  Appears patient  had had an AKI after last paracentesis and advised that he will be set up for as needed paracentesis with only removing 4 L at the time.  He was referred to IR regarding his TIPS.  He was referred for transplant evaluation at [facility].  He was advised to continue nadolol, rifaximin, lactulose and follow 2 g sodium diet.  Also advised triple phase CT  following creatinine improvement.  Office visit 04/16/2022 at [facility].  Patient had been scheduled for weekly paracentesis without diagnosis of SBP.  Reportedly was on Lasix at the time with improvement in lower extremity edema.  Reportedly had liver ultrasound in April 2023 without evidence of hepatoma, thrombosed TIPS, hepatofugal flow in the main portal vein and anterior branch of the right portal vein, cholelithiasis and AFP was 7.8 on 02/16/2022.  Plan was to begin liver transplant evaluation, encouraged smoking cessation, CT chest to screen for lung cancer, addiction psychiatry evaluation, was given referral to IR to consider removal of clot in the tips, undergo hypercoagulable assessment, discontinue omeprazole.  04/17/2022: Telemedicine visit with IR recommending triple phase liver CT with labs and a follow-up with IR in 1 month.  05/14/2022: Visit with [facility] hepatology reported negative hepatocellular carcinoma screening on recent triphase liver CT.  It was shown that he had gastric varices with coils.  Reported extensive thrombus of clips.  Moderate to large ascites, splenomegaly, and stool throughout the colon noted as well.  Reported last AFP 7.2 from 04/16/2022.  MELD was 17.  Advised again to start liver transplant evaluation, another referral to IR to consider PVR, EGD to follow-up on esophageal and gastric varices, hepatitis B vaccine and GI on smoking cessation.  Repeat labs ordered.  Was advised to replace 8 to 10 g of albumin IV per liter of ascites withdrawn.  05/25/2022: Documented televisit with IR, however no visit note available.  Most recent labs 08/03/2022 from [facility]: Hemoglobin 10.6, MCV 107.9, platelets 85, ammonia 198, alk phos 137, AST 42, ALT 26, bilirubin 1.1, albumin 3.5, creatinine 1.8, BUN 29, sodium 136  Admitted at [facility] in [place] from 8/22 to 8/25 for acute hepatic encephalopathy.  Labs as noted above.   MELD 3.0 -22 and MELD-Na -21, Child-Pugh C.  He underwent paracentesis on 8/23 with 6.3 L removed, negative SBP.  Hepatic encephalopathy related to stopping his lactulose due to traveling in the car and not wanting to stop for the bathroom.  He was given lactulose enemas and transition to oral once he was able to take it.  Was continued on rifaximin.  Creatinine initially 2.5 on admission and improved prior to discharge. He was advised to stop Januvia and instructed to continue spironolactone. Was given omeprazole. No instruction as to when or if to restart lasix.   Telephone note to [facility] reported that they received a phone call from a case manager with [facility] who stated that the patient was unable to receive care at [facility] for liver transplant services due to not having transplant benefits and [facility].  Per telephone note in our system patient's wife called to report patient was coming home from [place] and needed a paracentesis done the week of 8/28.  Today: Mental status - No confusion since returning.  No fogginess. Wife reports some slowness to responses.   Fatigue - energy has not been great. Sleeping good.   Medications - Currently not taking lasix. Taking lactulose as much as  he can to have 3-4 bowel movements a day. Each dose about 45 mL usually about 4-5 times a day.  No longer taking nadolol.   Was in [place] since early January. Has been having some intermittent nausea and vomiting for about 6-8 months  (not an everyday thing). Today it was vomiting after the lactulose. Thinks he may have drank too much water. January 93OI was last alcoholic drink. He reports they did start the liver transplant evaluation process. Had an echocardiogram, stress test, chest Xrays, liver ultrasound and kidneys. No heart caths. IR said the clots were too long/deep to fix. Diuretics controlling lower extremity edema.   Has been getting a paracentesis every week  (average 8-11L). Was getting 1 bottles of albumin for every 5 L drawn. Typically 2 doses each procedure. Gets a little dizzy after paracentesis. Gets sick the closer it gets to needing a para.   Needs new PCP for the area. Does have AA he will get plugged into.   Has lost a lot of weight recently. He is a meat and potatoes person. Lately has been eating a lot of frozen foods and not eating very healthy.   Taking potassium 40 meq daily. Smoking much less than he used to, maybe about 1/2 pack per day.     Current Outpatient Medications  Medication Sig Dispense Refill   allopurinol (ZYLOPRIM) 100 MG tablet Take 1 tablet (100 mg total) by mouth daily. 90 tablet 1   blood glucose meter kit and supplies Dispense based on patient and insurance preference. Test blood sugar once daily.  (FOR ICD-10 E10.9, E11.9). 1 each 5   folic acid (FOLVITE) 712 MCG tablet Take 400 mcg by mouth daily.     gabapentin (NEURONTIN) 400 MG capsule Take 1 tab p.o. AM and noon, then take 2 tab qhs. (Patient taking differently: Take 400 mg by mouth See admin instructions. Take 400 mg every morning and at 1200 noon, then take 800 mg at bedtime) 120 capsule 2   glucose blood (TRUE METRIX BLOOD GLUCOSE TEST) test strip Use as instructed 100 each 12   Insulin Pen Needle (NOVOFINE) 30G X 8 MM MISC Inject 10 each into the skin as needed. 90 each 1   lactulose (CHRONULAC) 10 GM/15ML solution TAKE 15ML BY MOUTH THREE TIMES DAILY, TITRATE FOR 3 TO 4 SOFT BOWEL MOVEMENTS A DAY 946 mL 3   Lancets (ACTI-LANCE 28G) MISC Use 3x per day as directed. 200 each 3   nadolol (CORGARD) 40 MG tablet Take 1.5 tablets (60 mg total) by mouth daily. 45 tablet 0   PARoxetine (PAXIL) 40 MG tablet Take 1 tablet (40 mg total) by mouth daily. 90 tablet 1   potassium chloride SA (KLOR-CON) 20 MEQ tablet Take 1 tablet (20 mEq total) by mouth daily as needed (Take when taking Lasix). 30 tablet 11   sildenafil (VIAGRA) 100 MG tablet Take 0.5 tablets (50 mg  total) by mouth as needed for erectile dysfunction. 30 tablet 2   spironolactone (ALDACTONE) 100 MG tablet TAKE 1 TABLET(100 MG) BY MOUTH DAILY 30 tablet 5   sucralfate (CARAFATE) 1 g tablet TAKE 1 TABLET BY MOUTH BEFORE MEALS 90 tablet 0   thiamine 100 MG tablet Take 1 tablet (100 mg total) by mouth daily. 90 tablet 1   XIFAXAN 550 MG TABS tablet TAKE 1 TABLET(550 MG) BY MOUTH TWICE DAILY 60 tablet 11   acamprosate (CAMPRAL) 333 MG tablet Take 666 mg by mouth 3 (three) times daily. (Patient not  taking: Reported on 10/07/2021)     Cholecalciferol (D3-1000) 25 MCG (1000 UT) capsule Take 1 capsule (1,000 Units total) by mouth daily. (Patient not taking: Reported on 08/07/2022) 90 capsule 3   dexlansoprazole (DEXILANT) 60 MG capsule Take 1 capsule (60 mg total) by mouth daily. (Patient not taking: Reported on 08/07/2022) 90 capsule 1   ferrous sulfate 325 (65 FE) MG EC tablet Take 1 tablet (325 mg total) by mouth 2 (two) times daily. (Patient not taking: Reported on 08/07/2022) 60 tablet 3   furosemide (LASIX) 40 MG tablet Take 0.5 tablets (20 mg total) by mouth daily. (Patient not taking: Reported on 08/07/2022) 45 tablet 1   insulin detemir (LEVEMIR FLEXTOUCH) 100 UNIT/ML FlexPen Take 30 units qhs sq. (Patient not taking: Reported on 08/07/2022) 15 mL 1   insulin lispro (HUMALOG KWIKPEN) 100 UNIT/ML KwikPen Take insulin as directed 3x per day with meals. 125 - 150 give 2 units of Humalog Insulin subq  151 - 200 give 4 units of Humalog Insulin subq  201 - 250 give 6 units of Humalog Insulin subq  251 - 300 give 8 units of Humalog Insulin subq  301 - 350 give 10 units of Humalog Insulin subq If over 350 call MD. (Patient not taking: Reported on 08/07/2022) 15 mL 2   JANUVIA 100 MG tablet Take 100 mg by mouth every morning. (Patient not taking: Reported on 08/07/2022)     Multiple Vitamin (MULTIVITAMIN WITH MINERALS) TABS tablet Take 1 tablet by mouth daily. (Patient not taking: Reported on 08/07/2022) 90  tablet 3   ondansetron (ZOFRAN-ODT) 4 MG disintegrating tablet DISSOLVE 1 TABLET ON THE TONGUE EVERY 8 HOURS AS NEEDED FOR NAUSEA (Patient not taking: Reported on 08/07/2022) 24 tablet 2   silver sulfADIAZINE (SSD) 1 % cream Apply topically 2 (two) times daily. Apply to rt lower leg burn for 14 days. (Patient not taking: Reported on 08/07/2022) 400 g 1   sitaGLIPtin (JANUVIA) 100 MG tablet Take 1 tablet (100 mg total) by mouth daily. (Patient not taking: Reported on 08/07/2022) 90 tablet 1   No current facility-administered medications for this visit.    Past Medical History:  Diagnosis Date   Alcoholic cirrhosis ([place])    Anxiety    Controlled type 2 diabetes mellitus with complication, without long-term current use of insulin ([place]) 06/26/2020   Enlarged liver    GERD (gastroesophageal reflux disease)    Hypertension    Palpitations    PVC's (premature ventricular contractions)    Shortness of breath    Tussive syncope 12/22/2019   Varicose veins     Past Surgical History:  Procedure Laterality Date   BIOPSY  02/02/2019   Procedure: BIOPSY;  Surgeon: Daneil Dolin, MD;  [facility];  Service: Endoscopy;;  gastric   COLONOSCOPY WITH ESOPHAGOGASTRODUODENOSCOPY (EGD)  12/16/2012   internal hemorrhoids, colonic diverticulosis, benign polyps, screening in 2024. EGD with mild erosive reflux esophagitis, small hiatal hernia, negative H.pylori   COLONOSCOPY WITH PROPOFOL N/A 06/22/2021   Procedure: COLONOSCOPY WITH PROPOFOL;  Surgeon: Daneil Dolin, MD;  [facility];  Service: Endoscopy;  Laterality: N/A;  1:45pm   cyst reomved     from spine   ESOPHAGOGASTRODUODENOSCOPY  05/19/09   RMR: Geographic distal esophageal erosions consistent with severe erosive reflux esophagitis. schatzki's ring s/P dilation/small hiatal hernia otherwise normal stomach   ESOPHAGOGASTRODUODENOSCOPY (EGD) WITH PROPOFOL N/A 02/02/2019   Dr. Gala Romney: retained gastric contents. portal HTN  gastropathy   ESOPHAGOGASTRODUODENOSCOPY (  EGD) WITH PROPOFOL N/A 11/20/2019   Procedure: ESOPHAGOGASTRODUODENOSCOPY (EGD) WITH PROPOFOL;  Surgeon: Daneil Dolin, MD;  [facility];  Service: Endoscopy;  Laterality: N/A;   ESOPHAGOGASTRODUODENOSCOPY (EGD) WITH PROPOFOL N/A 11/24/2019   Procedure: ESOPHAGOGASTRODUODENOSCOPY (EGD) WITH PROPOFOL;  Surgeon: Ronnette Juniper, MD;  Location: [place];  Service: Gastroenterology;  Laterality: N/A;   ESOPHAGOGASTRODUODENOSCOPY (EGD) WITH PROPOFOL N/A 08/08/2020   Procedure: ESOPHAGOGASTRODUODENOSCOPY (EGD) WITH PROPOFOL;  Surgeon: Daneil Dolin, MD;  [facility];  Service: Endoscopy;  Laterality: N/A;   ESOPHAGOGASTRODUODENOSCOPY (EGD) WITH PROPOFOL N/A 11/15/2020   Procedure: ESOPHAGOGASTRODUODENOSCOPY (EGD) WITH PROPOFOL;  Surgeon: Ladene Artist, MD;  Location: [facility];  Service: Endoscopy;  Laterality: N/A;   IR ANGIOGRAM SELECTIVE EACH ADDITIONAL VESSEL  11/22/2019   IR ANGIOGRAM SELECTIVE EACH ADDITIONAL VESSEL  11/24/2019   IR ANGIOGRAM SELECTIVE EACH ADDITIONAL VESSEL  11/24/2019   IR ANGIOGRAM SELECTIVE EACH ADDITIONAL VESSEL  11/24/2019   IR ANGIOGRAM SELECTIVE EACH ADDITIONAL VESSEL  11/15/2020   IR EMBO ART  VEN HEMORR LYMPH EXTRAV  INC GUIDE ROADMAPPING  11/22/2019   IR EMBO ART  VEN HEMORR LYMPH EXTRAV  INC GUIDE ROADMAPPING  11/24/2019   IR EMBO ART  VEN HEMORR LYMPH EXTRAV  INC GUIDE ROADMAPPING  11/15/2020   IR FLUORO GUIDE CV LINE RIGHT  11/15/2020   IR RADIOLOGIST EVAL & MGMT  12/29/2019   IR RADIOLOGIST EVAL & MGMT  10/27/2020   IR RADIOLOGIST EVAL & MGMT  04/19/2021   IR TIPS  11/24/2019   IR US GUIDE VASC ACCESS RIGHT  11/22/2019   IR US GUIDE VASC ACCESS RIGHT  11/15/2020   IR US GUIDE VASC ACCESS RIGHT  11/15/2020   IR VENOGRAM RENAL UNI LEFT  11/22/2019   IR VENOGRAM RENAL UNI LEFT  11/15/2020   POLYPECTOMY  06/22/2021   Procedure: POLYPECTOMY;  Surgeon: Daneil Dolin, MD;  [facility];   Service: Endoscopy;;   RADIOLOGY WITH ANESTHESIA N/A 11/24/2019   Procedure: IR WITH ANESTHESIA;  Surgeon: Radiologist, Medication, MD;  Location: [place];  Service: Radiology;  Laterality: N/A;   RADIOLOGY WITH ANESTHESIA N/A 11/15/2020   Procedure: IR WITH ANESTHESIA;  Surgeon: Suzette Battiest, MD;  Location: [place];  Service: Radiology;  Laterality: N/A;   TIPS PROCEDURE N/A 11/22/2019   Procedure: BRTO/TRANS-JUGULAR INTRAHEPATIC PORTAL SHUNT (TIPS);  Surgeon: Corrie Mckusick, DO;  Location: [place];  Service: Anesthesiology;  Laterality: N/A;   WISDOM TOOTH EXTRACTION      Family History  Problem Relation Age of Onset   Cancer Mother        ? 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   Right-handed.   No daily use of caffeine.   Social Determinants of Health   Financial Resource Strain: Not on file  Food Insecurity: Not on file  Transportation Needs: Not on file  Physical Activity: Not on file  Stress: Not on file  Social  Connections: Not on file     Review of Systems   Gen: Positive weight loss and fatigue.  Denies fever, chills, anorexia.   CV: Denies chest pain, palpitations, syncope, peripheral edema, and claudication. Resp: Denies dyspnea at rest, cough, wheezing, coughing up blood, and pleurisy. GI: See HPI Derm: Denies rash, itching, dry skin Psych: Denies depression, anxiety, memory loss, confusion. No homicidal or suicidal ideation.  Heme: Denies bruising, bleeding, and enlarged lymph nodes.   Physical Exam   BP 109/73 (BP Location: Left Arm, Patient Position: Sitting, Cuff Size: Large)   Pulse (!) 114   Temp 98 F (36.7 C) (Temporal)   Ht 6' (1.829 m)   Wt 240 lb 9.6 oz (109.1 kg)   SpO2 99%   BMI 32.63 kg/m   General:   Alert and oriented. No distress noted. Pleasant and cooperative.  Head:  Normocephalic and atraumatic. Eyes:  Conjuctiva clear without scleral icterus. Mouth:  Oral mucosa pink and moist. Good dentition. No lesions. Lungs:  Clear to auscultation bilaterally. No wheezes, rales, or rhonchi. No distress.  Heart:  S1, S2 present without murmurs appreciated.  Abdomen:  +BS, soft, rounded, soft.  No rebound or guarding. No HSM or masses noted. Rectal: deferred Msk:  Symmetrical without gross deformities. Normal posture. Extremities:  Without edema. Neurologic:  Alert and  oriented x4.  No asterixis. Psych:  Alert and cooperative. Normal mood and affect.   Assessment  Bradley Miles is a 56 y.o. male with a history of alcoholic cirrhosis with ascites, portal hypertension, recurrent bleeding secondary to gastric varices requiring variceal multiple sessions of treatment along with prior TIPs procedure, diabetes, anxiety, GERD, HTN presenting today for follow up of cirrhosis.  Decompensated alcoholic Cirrhosis: Prior attempts for alcohol cessation in the past.  Recently returned from [place] where he spent over 3 months at her treatment facility.  History of recurrent GI  bleed due to esophageal and gastric varices s/p coils and multiple TIPS procedures due to recurrent ascites.  Most recent TIPS failed and patient has been receiving almost weekly paracentesis.  Also with a couple of recent admissions with AKI.  He has been requiring albumin with his paracentesis.  Recent history, imaging, and lab work detailed in HPI.  Most recent AFP May 2023 at 7.2.  Triphasic liver CT in May 2023 without evidence of hepatoma.  Last paracentesis on 08/01/2022, SBP negative.  Currently on lactulose and Xifaxan. Most recent MELD 21/22.  Patient wife report he has been averaging 8 to 11 L of ascites removed during weekly paracenteses, requiring 1-2 bottles of concentrated albumin due to dizziness and lower blood pressures.  He just returned back home on 08/04/2022 from [place].  Where he spent a little over 6 months in a treatment facility.  Last drink of  alcohol was December 25, 2021.  He reports he was not previously following a 2 g sodium diet as he was living a bachelor life at his treatment facility, primarily eating frozen meals.  He does not have any lower extremity edema.  Due to recent hospitalization with back home with hepatic encephalopathy, he was found to have an AKI and has not been taking Lasix.  He has been taking Bumex 1 mg daily as well as his spironolactone.  He continues to take lactulose, 45 mL 3-5 times daily to have 3-4 bowel movements daily.  He is in need of a referral to transplant center to continue work-up that was started at the [place].  We spent significant time reviewing his prior medical events while in [place], and determining current medication regimen.  Per review of prior records he has been receiving once weekly paracenteses, we will set him up for as needed weekly paracentesis with labs and albumin as he has had failed TIPS.  We also discussed dietary management of ascites.  I have not changed any medications at this time and will continue to monitor.   Referral replaced to [facility] for transplant evaluation as requested by the patient.  His abdomen today is soft and not taut therefore did not recommend emergent paracentesis and will set him up on outpatient basis.  I will have him follow-up in the clinic in about 6 weeks, and we will repeat labs with ultrasound in 6 months.  We will speak with Dr. Gala Romney regarding repeating upper endoscopy given nausea and prior upper GI bleeds with placement of coils.  GERD: Having some intermittent reflux with his once daily PPI.  Also has been experiencing intermittent nausea for the last 6 to 8 months.  Reports that Zofran has not been very helpful.  He was having some intermittent indigestion during the visit today.  Have advised him to increase his omeprazole to 40 mg twice daily.  Recent AKI: Creatinine elevated on most recent mission, on discharge it was 1.8.  It appears his baseline prior was around 1.3.  We will recheck BMP in the near future to assess for improvement.  Patient reports he has been drinking extra water and working to stay hydrated. Unable to rule out hepatorenal syndrome at this time.   PLAN   Standing paracentesis order with cytology, cell count, and cultures and albumin 25 - 50g or 25% with each procedure.   Referral to transplant center - [facility] Continue Xifaxan Continue lactulose 45 ml TID, titrate for 2-3 bowel movements daily. Recheck BMP Follow a 2 g sodium diet Smoking cessation Complete alcohol abstinence. Increase omeprazole 40 mg to twice daily.  Continue spironolactone 100 mg daily.  MELD labs - CBC, CMP, PT/INR, AFP and [place] in 6 months.  Will discuss with Dr. Gala Romney regarding repeating EGD Advised patient to try primary care clinic on [place] to get established.  Addendum: Spoke with Dr. Gala Romney and will defer EGD to [facility] transplant unless patient is unable to get an appointment prior to the end of November.   Venetia Night, MSN, FNP-BC, AGACNP-BC [facility]

## 2022-08-07 ENCOUNTER — Encounter: Payer: Self-pay | Admitting: Gastroenterology

## 2022-08-07 ENCOUNTER — Telehealth: Payer: Self-pay | Admitting: Gastroenterology

## 2022-08-07 ENCOUNTER — Ambulatory Visit (INDEPENDENT_AMBULATORY_CARE_PROVIDER_SITE_OTHER): Payer: Managed Care, Other (non HMO) | Admitting: Gastroenterology

## 2022-08-07 VITALS — BP 109/73 | HR 114 | Temp 98.0°F | Ht 72.0 in | Wt 240.6 lb

## 2022-08-07 DIAGNOSIS — K7031 Alcoholic cirrhosis of liver with ascites: Secondary | ICD-10-CM

## 2022-08-07 DIAGNOSIS — K766 Portal hypertension: Secondary | ICD-10-CM | POA: Diagnosis not present

## 2022-08-07 LAB — BASIC METABOLIC PANEL
BUN/Creatinine Ratio: 17 (calc) (ref 6–22)
BUN: 41 mg/dL — ABNORMAL HIGH (ref 7–25)
CO2: 12 mmol/L — ABNORMAL LOW (ref 20–32)
Calcium: 9.4 mg/dL (ref 8.6–10.3)
Chloride: 109 mmol/L (ref 98–110)
Creat: 2.35 mg/dL — ABNORMAL HIGH (ref 0.70–1.30)
Glucose, Bld: 166 mg/dL — ABNORMAL HIGH (ref 65–99)
Potassium: 4.2 mmol/L (ref 3.5–5.3)
Sodium: 132 mmol/L — ABNORMAL LOW (ref 135–146)

## 2022-08-07 MED ORDER — OMEPRAZOLE 40 MG PO CPDR: 40.0000 mg | DELAYED_RELEASE_CAPSULE | Freq: Two times a day (BID) | ORAL | 2 refills | Status: DC

## 2022-08-07 NOTE — Telephone Encounter (Signed)
If you would please set this patient up for as needed weekly ultrasound paracentesis with cytology, cell count, and cultures.  He will also need albumin with each para, please order as needed 25 to 50 g of albumin per para.  He has been averaging 8 to 11 L of fluid removed with each para.  I would like to limit this to about 8 L for the first few paracentesis performed.    He is in need of his first para at [facility] this week.  Venetia Night, MSN, FNP-BC, AGACNP-BC [facility]

## 2022-08-07 NOTE — Patient Instructions (Addendum)
Keep up the great work on your alcohol abstinence.  I strongly encourage you to get plugged into resources that you can access daily and/or weekly.  We will make the referral to [facility] for continuing transplant evaluation.  For your reflux and nausea, I will have you increase your omeprazole to 40 mg twice daily for now.  We may need to consider changing formulations but for now we will increase your frequency.  I want you to follow 2 g sodium diet.  I will attach a handout for you.  Please continue to take your lactulose to titrate for 3-4 bowel movements daily, please let me know when you need refills.  Also want you to continue your Xifaxan and spironolactone as well as your Bumex.  I will order a basic metabolic panel for you to have completed when you have your paracentesis performed to check on your creatinine.  In order to maintain your weight and to help with your ascites, it would be beneficial for you to increase your protein intake.  I want you to accomplish this by drinking 1-2 protein shakes daily.  Good options include Ensure, boost, Premier protein, fair life core power, or other nutritious protein shakes.  Each shake should include at least a minimum of 25 g of protein.  Please monitor your weight at least weekly at home.  As we discussed is also a good idea to work toward smoking cessation, I am happy to provide a referral for this.  We unfortunately do not have a list of local PCPs in the area.  I would encourage you to check with the primary care office that is across the street from [facility] to see if they are taking new patients.   I will speak to Dr. Gala Romney regarding timing of your next EGD and we can discuss at your follow up in 6 weeks.   It was a pleasure to see you today. I want to create trusting relationships with patients. If you receive a survey regarding your visit,  I greatly appreciate you taking time to fill this out on paper or through your MyChart. I value your  feedback.  Venetia Night, MSN, FNP-BC, AGACNP-BC [facility]

## 2022-08-08 ENCOUNTER — Other Ambulatory Visit: Payer: Self-pay | Admitting: *Deleted

## 2022-08-08 DIAGNOSIS — R188 Other ascites: Secondary | ICD-10-CM

## 2022-08-08 NOTE — Addendum Note (Signed)
Addended by: Cheron Every on: 08/08/2022 08:40 AM   Modules accepted: Orders

## 2022-08-08 NOTE — Telephone Encounter (Signed)
Para scheduled for 08/09/22 at 9:00 am, pt will need to arrive at 8:45 am at [facility].    Bradley Miles

## 2022-08-08 NOTE — Telephone Encounter (Signed)
Pt informed of appt date and time. Verbalized understanding.

## 2022-08-08 NOTE — Telephone Encounter (Signed)
Mailbox is full - unable to leave message.

## 2022-08-09 ENCOUNTER — Other Ambulatory Visit: Payer: Self-pay | Admitting: Interventional Radiology

## 2022-08-09 ENCOUNTER — Encounter (HOSPITAL_COMMUNITY): Payer: Self-pay

## 2022-08-09 ENCOUNTER — Other Ambulatory Visit: Payer: Self-pay | Admitting: Gastroenterology

## 2022-08-09 ENCOUNTER — Telehealth: Payer: Self-pay | Admitting: Gastroenterology

## 2022-08-09 ENCOUNTER — Telehealth: Payer: Self-pay | Admitting: Internal Medicine

## 2022-08-09 ENCOUNTER — Ambulatory Visit (HOSPITAL_COMMUNITY)
Admission: RE | Admit: 2022-08-09 | Discharge: 2022-08-09 | Disposition: A | Discharge status: Home / Self Care | Payer: Managed Care, Other (non HMO) | Source: Ambulatory Visit | Attending: Gastroenterology | Admitting: Gastroenterology

## 2022-08-09 VITALS — BP 109/65 | HR 90 | Temp 98.2°F | Resp 18

## 2022-08-09 DIAGNOSIS — Z95828 Presence of other vascular implants and grafts: Secondary | ICD-10-CM

## 2022-08-09 DIAGNOSIS — K7031 Alcoholic cirrhosis of liver with ascites: Secondary | ICD-10-CM | POA: Diagnosis present

## 2022-08-09 DIAGNOSIS — I864 Gastric varices: Secondary | ICD-10-CM

## 2022-08-09 DIAGNOSIS — R188 Other ascites: Secondary | ICD-10-CM | POA: Diagnosis present

## 2022-08-09 DIAGNOSIS — K766 Portal hypertension: Secondary | ICD-10-CM

## 2022-08-09 LAB — BODY FLUID CELL COUNT WITH DIFFERENTIAL
Eos, Fluid: 0 %
Lymphs, Fluid: 19 %
Monocyte-Macrophage-Serous Fluid: 79 % (ref 50–90)
Neutrophil Count, Fluid: 2 % (ref 0–25)
Total Nucleated Cell Count, Fluid: 98 cu mm (ref 0–1000)

## 2022-08-09 LAB — GRAM STAIN: Gram Stain: NONE SEEN

## 2022-08-09 MED ORDER — ALBUMIN HUMAN 25 % IV SOLN
INTRAVENOUS | Status: AC
  Administered 2022-08-09: 50 g via INTRAVENOUS
  Filled 2022-08-09: qty 200

## 2022-08-09 MED ORDER — ALBUMIN HUMAN 25 % IV SOLN: 50.0000 g | Freq: Once | INTRAVENOUS | Status: AC

## 2022-08-09 NOTE — Telephone Encounter (Signed)
Patient has his paracentesis today.  I received notification from IR that they are going to have him follow-up in their portal hypertension clinic and they are going to arrange getting him an appointment.  Placing order for CTA abdomen with and without contrast with BRTO protocol.

## 2022-08-09 NOTE — Telephone Encounter (Signed)
Pt and wife came in late yesterday afternoon to sign a release for [place] to speak to wife about his medical care. She then said they decide for his referral to go to [facility]. I told her that I would let the nurse/schedulers be aware.

## 2022-08-09 NOTE — Telephone Encounter (Signed)
Noted  

## 2022-08-09 NOTE — Procedures (Signed)
  US guided LLQ paracentesis  8 Liters (maximum) of cloudy pale yellow fluid collected Sent for labs per MD  Pt tolerated well  EBL: none

## 2022-08-09 NOTE — Telephone Encounter (Signed)
Per courtney's note yesterday "Telephone note to [place] system reported that they received a phone call from a case manager with Bradley Miles who stated that the patient was unable to receive care at [facility] for liver transplant services due to not having transplant benefits and [facility]."  But will send to her to confirm

## 2022-08-09 NOTE — Progress Notes (Signed)
Paracentesis complete no signs of distress.  

## 2022-08-09 NOTE — Telephone Encounter (Signed)
Referral faxed to [facility] 

## 2022-08-10 LAB — CYTOLOGY - NON PAP

## 2022-08-14 ENCOUNTER — Telehealth: Payer: Self-pay | Admitting: *Deleted

## 2022-08-14 LAB — CULTURE, BODY FLUID W GRAM STAIN -BOTTLE: Culture: NO GROWTH

## 2022-08-14 NOTE — Telephone Encounter (Signed)
Patient seen in office last week. Defer to [facility] upon her return due to the complexity of his case.   According to Courtney's OV note, Dr. Gala Romney wants EGD done at transplant center, see her addendum.   Patient has had nausea for 6-8 months. I don't like using compazine or promethazine in patients with cirrhosis. I will defer to [place] upon her return. I can order increased dose of zofran if he would like.

## 2022-08-14 NOTE — Telephone Encounter (Addendum)
Spouse called in. 1) Reports Bradley Miles was going to call in phenergan or compazine for the nausea/vomiting but pharmacy has not received anything. 2) Thought patient was going to get scheduled for EGD. Bradley Miles states per last OV note will get with Dr. Gala Romney to see when he is due. 3) Reports IR mentioned something about having CT done. I see Bradley Miles ordered but the note not sent to [place]. After further looking it looks like [place] [facility] is setting up the CTA.  Bradley Miles out this week. Patient saw Bradley Miles prior, will forward note to her to advise

## 2022-08-16 ENCOUNTER — Emergency Department (HOSPITAL_COMMUNITY): Payer: Managed Care, Other (non HMO)

## 2022-08-16 ENCOUNTER — Other Ambulatory Visit: Payer: Self-pay

## 2022-08-16 ENCOUNTER — Ambulatory Visit (HOSPITAL_COMMUNITY)
Admission: RE | Admit: 2022-08-16 | Discharge: 2022-08-16 | Disposition: A | Discharge status: Home / Self Care | Payer: Managed Care, Other (non HMO) | Source: Ambulatory Visit | Attending: Gastroenterology | Admitting: Gastroenterology

## 2022-08-16 ENCOUNTER — Inpatient Hospital Stay (HOSPITAL_COMMUNITY)
Admission: EM | Admit: 2022-08-16 | Discharge: 2022-08-20 | DRG: 441 | Disposition: A | Discharge status: Home / Self Care | Payer: Managed Care, Other (non HMO) | Attending: Internal Medicine | Admitting: Internal Medicine

## 2022-08-16 ENCOUNTER — Encounter (HOSPITAL_COMMUNITY): Payer: Self-pay

## 2022-08-16 DIAGNOSIS — I10 Essential (primary) hypertension: Secondary | ICD-10-CM | POA: Diagnosis present

## 2022-08-16 DIAGNOSIS — I8510 Secondary esophageal varices without bleeding: Secondary | ICD-10-CM | POA: Diagnosis present

## 2022-08-16 DIAGNOSIS — Z7984 Long term (current) use of oral hypoglycemic drugs: Secondary | ICD-10-CM

## 2022-08-16 DIAGNOSIS — Z8249 Family history of ischemic heart disease and other diseases of the circulatory system: Secondary | ICD-10-CM

## 2022-08-16 DIAGNOSIS — Z8371 Family history of colonic polyps: Secondary | ICD-10-CM

## 2022-08-16 DIAGNOSIS — K7031 Alcoholic cirrhosis of liver with ascites: Secondary | ICD-10-CM | POA: Diagnosis present

## 2022-08-16 DIAGNOSIS — D696 Thrombocytopenia, unspecified: Secondary | ICD-10-CM | POA: Diagnosis present

## 2022-08-16 DIAGNOSIS — E139 Other specified diabetes mellitus without complications: Secondary | ICD-10-CM | POA: Diagnosis not present

## 2022-08-16 DIAGNOSIS — R188 Other ascites: Secondary | ICD-10-CM

## 2022-08-16 DIAGNOSIS — D175 Benign lipomatous neoplasm of intra-abdominal organs: Secondary | ICD-10-CM | POA: Diagnosis present

## 2022-08-16 DIAGNOSIS — N179 Acute kidney failure, unspecified: Secondary | ICD-10-CM | POA: Diagnosis not present

## 2022-08-16 DIAGNOSIS — Z79899 Other long term (current) drug therapy: Secondary | ICD-10-CM

## 2022-08-16 DIAGNOSIS — F419 Anxiety disorder, unspecified: Secondary | ICD-10-CM | POA: Diagnosis present

## 2022-08-16 DIAGNOSIS — E119 Type 2 diabetes mellitus without complications: Secondary | ICD-10-CM | POA: Diagnosis present

## 2022-08-16 DIAGNOSIS — K3189 Other diseases of stomach and duodenum: Secondary | ICD-10-CM | POA: Diagnosis present

## 2022-08-16 DIAGNOSIS — Z808 Family history of malignant neoplasm of other organs or systems: Secondary | ICD-10-CM

## 2022-08-16 DIAGNOSIS — K7210 Chronic hepatic failure without coma: Secondary | ICD-10-CM | POA: Diagnosis present

## 2022-08-16 DIAGNOSIS — K766 Portal hypertension: Secondary | ICD-10-CM | POA: Diagnosis present

## 2022-08-16 DIAGNOSIS — K767 Hepatorenal syndrome: Secondary | ICD-10-CM | POA: Diagnosis present

## 2022-08-16 DIAGNOSIS — K7682 Hepatic encephalopathy: Secondary | ICD-10-CM | POA: Diagnosis not present

## 2022-08-16 DIAGNOSIS — D509 Iron deficiency anemia, unspecified: Secondary | ICD-10-CM | POA: Diagnosis present

## 2022-08-16 DIAGNOSIS — K2100 Gastro-esophageal reflux disease with esophagitis, without bleeding: Secondary | ICD-10-CM | POA: Diagnosis present

## 2022-08-16 DIAGNOSIS — F17210 Nicotine dependence, cigarettes, uncomplicated: Secondary | ICD-10-CM | POA: Diagnosis present

## 2022-08-16 DIAGNOSIS — I864 Gastric varices: Secondary | ICD-10-CM | POA: Diagnosis present

## 2022-08-16 DIAGNOSIS — Z794 Long term (current) use of insulin: Secondary | ICD-10-CM

## 2022-08-16 DIAGNOSIS — J449 Chronic obstructive pulmonary disease, unspecified: Secondary | ICD-10-CM | POA: Diagnosis present

## 2022-08-16 LAB — BLOOD GAS, VENOUS
Acid-base deficit: 11.6 mmol/L — ABNORMAL HIGH (ref 0.0–2.0)
Bicarbonate: 11.9 mmol/L — ABNORMAL LOW (ref 20.0–28.0)
Drawn by: 442
O2 Saturation: 97.4 %
Patient temperature: 36.4
pCO2, Ven: 20 mmHg — ABNORMAL LOW (ref 44–60)
pH, Ven: 7.37 (ref 7.25–7.43)
pO2, Ven: 81 mmHg — ABNORMAL HIGH (ref 32–45)

## 2022-08-16 LAB — CBC WITH DIFFERENTIAL/PLATELET
Abs Immature Granulocytes: 0.02 10*3/uL (ref 0.00–0.07)
Basophils Absolute: 0 10*3/uL (ref 0.0–0.1)
Basophils Relative: 1 %
Eosinophils Absolute: 0.1 10*3/uL (ref 0.0–0.5)
Eosinophils Relative: 2 %
HCT: 33.9 % — ABNORMAL LOW (ref 39.0–52.0)
Hemoglobin: 11.4 g/dL — ABNORMAL LOW (ref 13.0–17.0)
Immature Granulocytes: 0 %
Lymphocytes Relative: 7 %
Lymphs Abs: 0.3 10*3/uL — ABNORMAL LOW (ref 0.7–4.0)
MCH: 36.9 pg — ABNORMAL HIGH (ref 26.0–34.0)
MCHC: 33.6 g/dL (ref 30.0–36.0)
MCV: 109.7 fL — ABNORMAL HIGH (ref 80.0–100.0)
Monocytes Absolute: 0.6 10*3/uL (ref 0.1–1.0)
Monocytes Relative: 13 %
Neutro Abs: 4 10*3/uL (ref 1.7–7.7)
Neutrophils Relative %: 77 %
Platelets: 79 10*3/uL — ABNORMAL LOW (ref 150–400)
RBC: 3.09 MIL/uL — ABNORMAL LOW (ref 4.22–5.81)
RDW: 14.6 % (ref 11.5–15.5)
WBC: 5.1 10*3/uL (ref 4.0–10.5)
nRBC: 0 % (ref 0.0–0.2)

## 2022-08-16 LAB — PROTIME-INR
INR: 1.4 — ABNORMAL HIGH (ref 0.8–1.2)
Prothrombin Time: 16.9 seconds — ABNORMAL HIGH (ref 11.4–15.2)

## 2022-08-16 LAB — GRAM STAIN

## 2022-08-16 LAB — COMPREHENSIVE METABOLIC PANEL
ALT: 30 U/L (ref 0–44)
AST: 43 U/L — ABNORMAL HIGH (ref 15–41)
Albumin: 3.9 g/dL (ref 3.5–5.0)
Alkaline Phosphatase: 151 U/L — ABNORMAL HIGH (ref 38–126)
Anion gap: 7 (ref 5–15)
BUN: 59 mg/dL — ABNORMAL HIGH (ref 6–20)
CO2: 15 mmol/L — ABNORMAL LOW (ref 22–32)
Calcium: 9.6 mg/dL (ref 8.9–10.3)
Chloride: 111 mmol/L (ref 98–111)
Creatinine, Ser: 2.8 mg/dL — ABNORMAL HIGH (ref 0.61–1.24)
GFR, Estimated: 26 mL/min — ABNORMAL LOW (ref >60)
Glucose, Bld: 200 mg/dL — ABNORMAL HIGH (ref 70–99)
Potassium: 3.8 mmol/L (ref 3.5–5.1)
Sodium: 133 mmol/L — ABNORMAL LOW (ref 135–145)
Total Bilirubin: 2.2 mg/dL — ABNORMAL HIGH (ref 0.3–1.2)
Total Protein: 7.3 g/dL (ref 6.5–8.1)

## 2022-08-16 LAB — BODY FLUID CELL COUNT WITH DIFFERENTIAL
Lymphs, Fluid: 22 %
Monocyte-Macrophage-Serous Fluid: 73 % (ref 50–90)
Neutrophil Count, Fluid: 5 % (ref 0–25)
Total Nucleated Cell Count, Fluid: 153 cu mm (ref 0–1000)

## 2022-08-16 LAB — AMMONIA: Ammonia: 162 umol/L — ABNORMAL HIGH (ref 9–35)

## 2022-08-16 LAB — ETHANOL: Alcohol, Ethyl (B): <10 mg/dL (ref <10)

## 2022-08-16 MED ORDER — RIFAXIMIN 550 MG PO TABS
550.0000 mg | ORAL_TABLET | Freq: Two times a day (BID) | ORAL | Status: DC
  Administered 2022-08-16 – 2022-08-20 (×8): 550 mg via ORAL
  Filled 2022-08-16 (×8): qty 1

## 2022-08-16 MED ORDER — PANTOPRAZOLE SODIUM 40 MG PO TBEC
40.0000 mg | DELAYED_RELEASE_TABLET | Freq: Every day | ORAL | Status: DC
  Administered 2022-08-17 – 2022-08-20 (×4): 40 mg via ORAL
  Filled 2022-08-16 (×4): qty 1

## 2022-08-16 MED ORDER — ALBUMIN HUMAN 25 % IV SOLN: 50.0000 g | Freq: Once | INTRAVENOUS | Status: AC

## 2022-08-16 MED ORDER — ALBUMIN HUMAN 25 % IV SOLN
INTRAVENOUS | Status: AC
  Administered 2022-08-16: 50 g via INTRAVENOUS
  Filled 2022-08-16: qty 200

## 2022-08-16 MED ORDER — LACTULOSE ENEMA
300.0000 mL | Freq: Once | ORAL | Status: AC
  Administered 2022-08-16: 300 mL via RECTAL
  Filled 2022-08-16: qty 300

## 2022-08-16 MED ORDER — LACTATED RINGERS IV BOLUS
500.0000 mL | Freq: Once | INTRAVENOUS | Status: AC
  Administered 2022-08-16: 500 mL via INTRAVENOUS

## 2022-08-16 MED ORDER — ACETAMINOPHEN 650 MG RE SUPP: 650.0000 mg | Freq: Four times a day (QID) | RECTAL | Status: DC | PRN

## 2022-08-16 MED ORDER — ACETAMINOPHEN 325 MG PO TABS
650.0000 mg | ORAL_TABLET | Freq: Four times a day (QID) | ORAL | Status: DC | PRN
  Administered 2022-08-17: 650 mg via ORAL
  Filled 2022-08-16: qty 2

## 2022-08-16 MED ORDER — LACTULOSE 10 GM/15ML PO SOLN
10.0000 g | Freq: Three times a day (TID) | ORAL | Status: DC
  Administered 2022-08-16 – 2022-08-17 (×2): 10 g via ORAL
  Filled 2022-08-16 (×2): qty 30

## 2022-08-16 MED ORDER — ONDANSETRON HCL 4 MG/2ML IJ SOLN
4.0000 mg | Freq: Four times a day (QID) | INTRAMUSCULAR | Status: DC | PRN
  Administered 2022-08-19: 4 mg via INTRAVENOUS
  Filled 2022-08-16: qty 2

## 2022-08-16 MED ORDER — ONDANSETRON HCL 4 MG PO TABS: 4.0000 mg | ORAL_TABLET | Freq: Four times a day (QID) | ORAL | Status: DC | PRN

## 2022-08-16 NOTE — Progress Notes (Signed)
Paracentesis complete no signs of distress.  

## 2022-08-16 NOTE — ED Notes (Signed)
Per pt's wife, pt had a paracentesis today, NOT a cardiocentesis. States pt has paracentesis every Thursday

## 2022-08-16 NOTE — ED Provider Notes (Signed)
[facility] EMERGENCY DEPARTMENT Provider Note   CSN: [id number] Arrival date & time: 08/16/22  1411     History  Chief Complaint  Patient presents with   abnormal labs    Bradley Miles is a 56 y.o. male. With pmh type 2 diabetes, alcoholic cirrhosis with history of TIPS procedure, esophageal varices who presents with AMS x 1 day. Of note, recently had LVP done earlier today which was negative for SBP.  Patient's wife says he often does this whenever his ammonia levels are greater than 200.  He went for a nap today after the LVP and then when he woke up he was altered and somnolent.  He is confused.  There was no recent head trauma or falls.  He has not been sick recently with no fevers, no melena, no hematochezia, no URI symptoms.  He has not been complaining of any pain.  She gave him 3 doses of lactulose today but his bowel movements have only been 1-1/2 and typically they should be 3 bowel movements daily.  He does not drink alcohol anymore, his last drink was in January.  He has intermittent nausea and nonbloody nonbilious emesis.  Wife notes that he has decreased solid intake but does have a lot of ice water intake.  HPI     Home Medications Prior to Admission medications   Medication Sig Start Date End Date Taking? Authorizing Provider  allopurinol (ZYLOPRIM) 100 MG tablet Take 1 tablet (100 mg total) by mouth daily. 08/23/21   Elvia Collum M, DO  blood glucose meter kit and supplies Dispense based on patient and insurance preference. Test blood sugar once daily.  (FOR ICD-10 E10.9, E11.9). 04/21/20   Lovena Le, Malena M, DO  bumetanide (BUMEX) 1 MG tablet Take 1 mg by mouth daily.    [provider]  folic acid (FOLVITE) 353 MCG tablet Take 400 mcg by mouth daily.    [provider]  gabapentin (NEURONTIN) 400 MG capsule Take 1 tab p.o. AM and noon, then take 2 tab qhs. Patient taking differently: Take 400 mg by mouth See admin instructions. Take 400 mg every  morning and at 1200 noon, then take 800 mg at bedtime 08/23/21   Elvia Collum M, DO  glucose blood (TRUE METRIX BLOOD GLUCOSE TEST) test strip Use as instructed 08/23/21   Elvia Collum M, DO  Insulin Pen Needle (NOVOFINE) 30G X 8 MM MISC Inject 10 each into the skin as needed. 08/23/21   Lovena Le, Malena M, DO  lactulose (CHRONULAC) 10 GM/15ML solution TAKE 15ML BY MOUTH THREE TIMES DAILY, TITRATE FOR 3 TO 4 SOFT BOWEL MOVEMENTS A DAY 05/02/21   Annitta Needs, NP  Lancets (ACTI-LANCE 28G) MISC Use 3x per day as directed. 08/23/21   Elvia Collum M, DO  omeprazole (PRILOSEC) 40 MG capsule Take 1 capsule (40 mg total) by mouth in the morning and at bedtime. 08/07/22   Sherron Monday, NP  ondansetron (ZOFRAN-ODT) 4 MG disintegrating tablet DISSOLVE 1 TABLET ON THE TONGUE EVERY 8 HOURS AS NEEDED FOR NAUSEA Patient not taking: Reported on 08/07/2022 01/20/20   Annitta Needs, NP  PARoxetine (PAXIL) 40 MG tablet Take 1 tablet (40 mg total) by mouth daily. 08/23/21   Elvia Collum M, DO  potassium chloride SA (KLOR-CON) 20 MEQ tablet Take 1 tablet (20 mEq total) by mouth daily as needed (Take when taking Lasix). 08/08/20   Roxan Hockey, MD  sildenafil (VIAGRA) 100 MG tablet Take 0.5 tablets (50 mg  total) by mouth as needed for erectile dysfunction. 04/19/21   Elvia Collum M, DO  spironolactone (ALDACTONE) 100 MG tablet TAKE 1 TABLET(100 MG) BY MOUTH DAILY 02/12/22   Mahala Menghini, PA-C  sucralfate (CARAFATE) 1 g tablet TAKE 1 TABLET BY MOUTH BEFORE MEALS 10/19/21   Nilda Simmer, NP  thiamine 100 MG tablet Take 1 tablet (100 mg total) by mouth daily. 11/23/20   Carlis Stable, NP  XIFAXAN 550 MG TABS tablet TAKE 1 TABLET(550 MG) BY MOUTH TWICE DAILY 07/20/22   Mahala Menghini, PA-C      Allergies    Patient has no known allergies.    Review of Systems   Review of Systems  Physical Exam Updated Vital Signs BP 108/65 (BP Location: Left Arm)   Pulse 88   Temp (!) 97.5 F (36.4 C) (Oral)   Resp  15   Ht 6' (1.829 m)   Wt 100 kg   SpO2 100%   BMI 29.90 kg/m  Physical Exam Constitutional: Alert and oriented to person and place, encephalopathic, follows commands, somnolent but arousable to voice and stimulation Eyes: Conjunctivae are normal. ENT      Head: Normocephalic and atraumatic.      Nose: No congestion.      Mouth/Throat: Mucous membranes are moist.      Neck: No stridor. Cardiovascular: S1, S2,  Normal and symmetric distal pulses are present in all extremities.Warm and well perfused. Respiratory: Normal respiratory effort. Breath sounds are normal. Gastrointestinal: Soft and nontender. There is no CVA tenderness.  Left lower quadrant bandage over LVP site, no active drainage. Musculoskeletal: Normal range of motion in all extremities.      Right lower leg: No tenderness or edema.      Left lower leg: No tenderness or edema. Neurologic: Alert and oriented to person and place, encephalopathic, follows commands, somnolent but arousable to voice and stimulation, gives thumbs up on bilateral hands, wiggles toes bilaterally Skin: Skin is warm, dry and intact. No rash noted. Psychiatric: Encephalopathic  ED Results / Procedures / Treatments   Labs (all labs ordered are listed, but only abnormal results are displayed) Labs Reviewed  COMPREHENSIVE METABOLIC PANEL - Abnormal; Notable for the following components:      Result Value   Sodium 133 (*)    CO2 15 (*)    Glucose, Bld 200 (*)    BUN 59 (*)    Creatinine, Ser 2.80 (*)    AST 43 (*)    Alkaline Phosphatase 151 (*)    Total Bilirubin 2.2 (*)    GFR, Estimated 26 (*)    All other components within normal limits  CBC WITH DIFFERENTIAL/PLATELET - Abnormal; Notable for the following components:   RBC 3.09 (*)    Hemoglobin 11.4 (*)    HCT 33.9 (*)    MCV 109.7 (*)    MCH 36.9 (*)    Platelets 79 (*)    Lymphs Abs 0.3 (*)    All other components within normal limits  AMMONIA - Abnormal; Notable for the following  components:   Ammonia 162 (*)    All other components within normal limits  PROTIME-INR - Abnormal; Notable for the following components:   Prothrombin Time 16.9 (*)    INR 1.4 (*)    All other components within normal limits  BLOOD GAS, VENOUS - Abnormal; Notable for the following components:   pCO2, Ven 20 (*)    pO2, Ven 81 (*)  Bicarbonate 11.9 (*)    Acid-base deficit 11.6 (*)    All other components within normal limits  ETHANOL  HIV ANTIBODY (ROUTINE TESTING W REFLEX)  BASIC METABOLIC PANEL  CBC    EKG None  Radiology CT Head Wo Contrast  Result Date: 08/16/2022 CLINICAL DATA:  Altered mental status EXAM: CT HEAD WITHOUT CONTRAST TECHNIQUE: Contiguous axial images were obtained from the base of the skull through the vertex without intravenous contrast. RADIATION DOSE REDUCTION: This exam was performed according to the departmental dose-optimization program which includes automated exposure control, adjustment of the mA and/or kV according to patient size and/or use of iterative reconstruction technique. COMPARISON:  Brain CT 01/15/2014 FINDINGS: Brain: No evidence of acute infarction, hemorrhage, hydrocephalus, extra-axial collection or mass lesion/mass effect. Vascular: No hyperdense vessel or unexpected calcification. Skull: Normal. Negative for fracture or focal lesion. Sinuses/Orbits: Mild paranasal sinus mucosal thickening. No air-fluid levels. Other: None. IMPRESSION: No acute intracranial process Electronically Signed   By: Lovey Newcomer M.D.   On: 08/16/2022 17:15   DG Chest Port 1 View  Result Date: 08/16/2022 CLINICAL DATA:  Altered mental status.  Prior thoracentesis EXAM: PORTABLE CHEST 1 VIEW COMPARISON:  chest radiograph November 16, 2020 FINDINGS: Monitoring leads overlie the patient. Stable cardiac and mediastinal contours. No consolidative pulmonary opacities. No pleural effusion or pneumothorax. Thoracic spine degenerative changes. IMPRESSION: No acute  cardiopulmonary process. Electronically Signed   By: Lovey Newcomer M.D.   On: 08/16/2022 15:18   US Paracentesis  Result Date: 08/16/2022 INDICATION: Recurrent ascites; Cirrhosis EXAM: ULTRASOUND GUIDED LLQ PARACENTESIS MEDICATIONS: 10 cc 1% lidocaine COMPLICATIONS: None immediate. PROCEDURE: Informed written consent was obtained from the patient after a discussion of the risks, benefits and alternatives to treatment. A timeout was performed prior to the initiation of the procedure. Initial ultrasound scanning demonstrates a large amount of ascites within the left lower abdominal quadrant. The left lower abdomen was prepped and draped in the usual sterile fashion. 1% lidocaine was used for local anesthesia. Following this, a Yueh catheter was introduced. An ultrasound image was saved for documentation purposes. The paracentesis was performed. The catheter was removed and a dressing was applied. The patient tolerated the procedure well without immediate post procedural complication. Patient received post-procedure intravenous albumin; see nursing notes for details. FINDINGS: A total of approximately 6 liters of cloudy yellow fluid was removed. Samples were sent to the laboratory as requested by the clinical team. IMPRESSION: Successful ultrasound-guided paracentesis yielding 6 liters of peritoneal fluid. Read by Lavonia Drafts [facility] Electronically Signed   By: Lavonia Dana M.D.   On: 08/16/2022 10:46    Procedures Procedures  Remain on constant cardiac monitoring, normal sinus rhythm.  Medications Ordered in ED Medications  pantoprazole (PROTONIX) EC tablet 40 mg (has no administration in time range)  rifaximin (XIFAXAN) tablet 550 mg (550 mg Oral Given 08/16/22 2304)  lactulose (CHRONULAC) 10 GM/15ML solution 10 g (10 g Oral Given 08/16/22 2304)  acetaminophen (TYLENOL) tablet 650 mg (has no administration in time range)    Or  acetaminophen (TYLENOL) suppository 650 mg (has no administration in time range)   ondansetron (ZOFRAN) tablet 4 mg (has no administration in time range)    Or  ondansetron (ZOFRAN) injection 4 mg (has no administration in time range)  lactulose (CHRONULAC) enema 200 gm (300 mLs Rectal Given 08/16/22 1750)  lactated ringers bolus 500 mL (500 mLs Intravenous Bolus 08/16/22 1720)  lactated ringers bolus 500 mL (500 mLs Intravenous Bolus 08/16/22 1739)  ED Course/ Medical Decision Making/ A&P Clinical Course as of 08/16/22 2352  Thu Aug 16, 2022  1724 Patient's labs remarkable for elevated ammonia 162 concerning for hepatic encephalopathy consistent with presentation today.  He also has anemia that is macrocytic hemoglobin 11.4 and thrombocytopenia 79 likely related to underlying cirrhosis.  Wife denying any hematemesis, hematochezia or melena.  His creatinine is elevated 2.8 with an elevated BUN to creatinine ratio likely due to vomiting and prerenal losses.  I have ordered small volume fluids.  Personal interpretation of CT head showed no ICH agree with radiology read.  Will page for admission for further work-up and management. [VB]  1748 Discussed case with hospitalist will be down to evaluate the patient put in orders for admission. [VB]    Clinical Course User Index [VB] Elgie Congo, MD                           Medical Decision Making MAURIZIO GENO is a 56 y.o. male. With pmh type 2 diabetes, alcoholic cirrhosis with history of TIPS procedure, esophageal varices who presents with AMS x 1 day.   With history of cirrhosis, consider most likely underlying causes of somnolence and altered mental status such as hyperammonemia, acute electrolyte abnormality such as hyponatremia or hypoglycemia, hepatorenal syndrome, among multiple other etiologies.  Infection less likely as patient afebrile and not meeting SIRS criteria.  LVP performed today which was negative for SBP.  No evidence of head trauma and no focal deficits as he is moving all extremities and following  commands but encephalopathic so will still obtain CT head to ensure no acute intracranial abnormality.  Will obtain chest x-ray and UA to further evaluate for evidence of infection although less likely with no fever or infectious symptoms.  We will plan for likely lactulose enema and admission for suspected hepatic encephalopathy.  Amount and/or Complexity of Data Reviewed Labs: ordered. Decision-making details documented in ED Course. Radiology: ordered and independent interpretation performed. Decision-making details documented in ED Course.  Risk Decision regarding hospitalization.    Final Clinical Impression(s) / ED Diagnoses Final diagnoses:  Encephalopathy, hepatic ([place])  AKI (acute kidney injury) [facility])    Rx / [place] Orders ED Discharge Orders     None         Elgie Congo, MD 08/16/22 2353

## 2022-08-16 NOTE — ED Notes (Signed)
Pt tolerated Flexi-Seal tube placement well. Lactulose medication inserted in appropriate compartment of tube and clamped for 30-60 minutes

## 2022-08-16 NOTE — Assessment & Plan Note (Signed)
Baseline appears to be 0.6-0.8.  He is on diuretics.  And frequently gets large-volume paracentesis.  Question hepatorenal syndrome, versus prerenal from diuretics. -1 L bolus given in ED- -hold spironolactone, Bumex for now

## 2022-08-16 NOTE — Assessment & Plan Note (Signed)
Platelets 79.  Recent baseline 71 239. -SCDs

## 2022-08-16 NOTE — Assessment & Plan Note (Signed)
Blood glucose 200.  A1c 5.6. - SSi- s

## 2022-08-16 NOTE — Assessment & Plan Note (Signed)
Lethargy, on lactulose and rifaximin.  Spouse reports compliance.  Head CT unremarkable.  Chest x-ray clear.  Paracentesis today, fluid analysis not suggestive of infectious at this time. -Lactulose enema given in ED, resume home lactulose -Resume rifaximin -Follow-up culture results from ascitic fluid analysis today

## 2022-08-16 NOTE — ED Provider Notes (Signed)
MSE was initiated and I personally evaluated the patient and placed orders (if any) at  2:49 PM on August 16, 2022.  The patient appears stable so that the remainder of the MSE may be completed by another provider.  He has a history of end-stage liver disease is on lactulose today and gets weekly paracentesis.  He had a paracentesis today but when he returned home he was somnolent and confused.  His companion said this is what he is like when he is encephalopathic.  She said he was admitted about 3 weeks ago for a similar episode.  Patient is somnolent arousable minimally conversant.  Vitals with soft blood pressure otherwise unremarkable.  Getting screening labs EKG chest x-ray.   Hayden Rasmussen, MD 08/16/22 [phone]

## 2022-08-16 NOTE — H&P (Signed)
History and Physical    Bradley Miles MWU:[id number] DOB: 1966-10-12 DOA: 08/16/2022  PCP: [name], MD   Patient coming from: Home  I have personally briefly reviewed patient's old medical records in [place]  Chief Complaint: AMS  HPI: Bradley Miles is a 56 y.o. male with medical history significant for alcoholic liver cirrhosis, diabetes mellitus, hypertension, varices, COPD. Patient was brought to the ED reports of altered mental status.  Patient had paracentesis today, and on getting home, he took a nap and subsequently woke up confused and somnolent. Per spouse this usually happens when his ammonia levels are high.  At the time of my evaluation, patient is lethargic, closes eyes, but opens eyes to voice, and answering questions.  He denies pain.  He denies vomiting.  Spouse, patient has not had any alcoholic beverage since January.  Spouse also reported that patient received his lactulose today, and had 1-1/2 bowel movement.  Reports that he has been needing increasing doses of lactulose to make him have a bowel movement.  Reports compliance with both lactulose and rifaximin.  Patient had paracentesis today, had 6 L removed.  Ascitic fluid analysis not suggestive of infection.  ED Course: Stable vitals.  Ammonia elevated at 162.  VBG shows pH of 7.3.  Creatinine elevated 2.8.  Head CT negative for acute abnormality.  Portable chest x-ray unremarkable also. Liter bolus given.  Lactulose enema given.   Review of Systems: As per HPI all other systems reviewed and negative.  Past Medical History:  Diagnosis Date   Alcoholic cirrhosis ([place])    Anxiety    Controlled type 2 diabetes mellitus with complication, without long-term current use of insulin ([place]) 06/26/2020   Enlarged liver    GERD (gastroesophageal reflux disease)    Hypertension    Palpitations    PVC's (premature ventricular contractions)    Shortness of breath    Tussive syncope 12/22/2019    Varicose veins     Past Surgical History:  Procedure Laterality Date   BIOPSY  02/02/2019   Procedure: BIOPSY;  Surgeon: Daneil Dolin, MD;  [facility];  Service: Endoscopy;;  gastric   COLONOSCOPY WITH ESOPHAGOGASTRODUODENOSCOPY (EGD)  12/16/2012   internal hemorrhoids, colonic diverticulosis, benign polyps, screening in 2024. EGD with mild erosive reflux esophagitis, small hiatal hernia, negative H.pylori   COLONOSCOPY WITH PROPOFOL N/A 06/22/2021   Procedure: COLONOSCOPY WITH PROPOFOL;  Surgeon: Daneil Dolin, MD;  [facility];  Service: Endoscopy;  Laterality: N/A;  1:45pm   cyst reomved     from spine   ESOPHAGOGASTRODUODENOSCOPY  05/19/09   RMR: Geographic distal esophageal erosions consistent with severe erosive reflux esophagitis. schatzki's ring s/P dilation/small hiatal hernia otherwise normal stomach   ESOPHAGOGASTRODUODENOSCOPY (EGD) WITH PROPOFOL N/A 02/02/2019   Dr. Gala Romney: retained gastric contents. portal HTN gastropathy   ESOPHAGOGASTRODUODENOSCOPY (EGD) WITH PROPOFOL N/A 11/20/2019   Procedure: ESOPHAGOGASTRODUODENOSCOPY (EGD) WITH PROPOFOL;  Surgeon: Daneil Dolin, MD;  [facility];  Service: Endoscopy;  Laterality: N/A;   ESOPHAGOGASTRODUODENOSCOPY (EGD) WITH PROPOFOL N/A 11/24/2019   Procedure: ESOPHAGOGASTRODUODENOSCOPY (EGD) WITH PROPOFOL;  Surgeon: Ronnette Juniper, MD;  Location: [place];  Service: Gastroenterology;  Laterality: N/A;   ESOPHAGOGASTRODUODENOSCOPY (EGD) WITH PROPOFOL N/A 08/08/2020   Procedure: ESOPHAGOGASTRODUODENOSCOPY (EGD) WITH PROPOFOL;  Surgeon: Daneil Dolin, MD;  [facility];  Service: Endoscopy;  Laterality: N/A;   ESOPHAGOGASTRODUODENOSCOPY (EGD) WITH PROPOFOL N/A 11/15/2020   Procedure: ESOPHAGOGASTRODUODENOSCOPY (EGD) WITH PROPOFOL;  Surgeon: Ladene Artist, MD;  [facility];  Service: Endoscopy;  Laterality: N/A;   IR ANGIOGRAM SELECTIVE EACH ADDITIONAL VESSEL  11/22/2019   IR  ANGIOGRAM SELECTIVE EACH ADDITIONAL VESSEL  11/24/2019   IR ANGIOGRAM SELECTIVE EACH ADDITIONAL VESSEL  11/24/2019   IR ANGIOGRAM SELECTIVE EACH ADDITIONAL VESSEL  11/24/2019   IR ANGIOGRAM SELECTIVE EACH ADDITIONAL VESSEL  11/15/2020   IR EMBO ART  VEN HEMORR LYMPH EXTRAV  INC GUIDE ROADMAPPING  11/22/2019   IR EMBO ART  VEN HEMORR LYMPH EXTRAV  INC GUIDE ROADMAPPING  11/24/2019   IR EMBO ART  VEN HEMORR LYMPH EXTRAV  INC GUIDE ROADMAPPING  11/15/2020   IR FLUORO GUIDE CV LINE RIGHT  11/15/2020   IR RADIOLOGIST EVAL & MGMT  12/29/2019   IR RADIOLOGIST EVAL & MGMT  10/27/2020   IR RADIOLOGIST EVAL & MGMT  04/19/2021   IR TIPS  11/24/2019   IR US GUIDE VASC ACCESS RIGHT  11/22/2019   IR US GUIDE VASC ACCESS RIGHT  11/15/2020   IR US GUIDE VASC ACCESS RIGHT  11/15/2020   IR VENOGRAM RENAL UNI LEFT  11/22/2019   IR VENOGRAM RENAL UNI LEFT  11/15/2020   POLYPECTOMY  06/22/2021   Procedure: POLYPECTOMY;  Surgeon: Daneil Dolin, MD;  [facility];  Service: Endoscopy;;   RADIOLOGY WITH ANESTHESIA N/A 11/24/2019   Procedure: IR WITH ANESTHESIA;  Surgeon: Radiologist, Medication, MD;  Location: [place];  Service: Radiology;  Laterality: N/A;   RADIOLOGY WITH ANESTHESIA N/A 11/15/2020   Procedure: IR WITH ANESTHESIA;  Surgeon: Suzette Battiest, MD;  Location: [place];  Service: Radiology;  Laterality: N/A;   TIPS PROCEDURE N/A 11/22/2019   Procedure: BRTO/TRANS-JUGULAR INTRAHEPATIC PORTAL SHUNT (TIPS);  Surgeon: Corrie Mckusick, DO;  Location: [place];  Service: Anesthesiology;  Laterality: N/A;   WISDOM TOOTH EXTRACTION       reports that he has been smoking cigarettes. He started smoking about 42 years ago. He has a 30.00 pack-year smoking history. He has never used smokeless tobacco. He reports current alcohol use. He reports that he does not use drugs.  No Known Allergies  Family History  Problem Relation Age of Onset   Cancer Mother        ? etiology   Heart disease Mother    Colon polyps  Sister 15       two sisters   Brain cancer Father    Liver disease Neg Hx    Prior to Admission medications   Medication Sig Start Date End Date Taking? Authorizing Provider  allopurinol (ZYLOPRIM) 100 MG tablet Take 1 tablet (100 mg total) by mouth daily. 08/23/21   Elvia Collum M, DO  blood glucose meter kit and supplies Dispense based on patient and insurance preference. Test blood sugar once daily.  (FOR ICD-10 E10.9, E11.9). 04/21/20   Lovena Le, Malena M, DO  bumetanide (BUMEX) 1 MG tablet Take 1 mg by mouth daily.    [provider]  folic acid (FOLVITE) 470 MCG tablet Take 400 mcg by mouth daily.    [provider]  gabapentin (NEURONTIN) 400 MG capsule Take 1 tab p.o. AM and noon, then take 2 tab qhs. Patient taking differently: Take 400 mg by mouth See admin instructions. Take 400 mg every morning and at 1200 noon, then take 800 mg at bedtime 08/23/21   Elvia Collum M, DO  glucose blood (TRUE METRIX BLOOD GLUCOSE TEST) test strip Use as instructed 08/23/21   Lovena Le,  Malena M, DO  Insulin Pen Needle (NOVOFINE) 30G X 8 MM MISC Inject 10 each into the skin as needed. 08/23/21   Lovena Le, Malena M, DO  lactulose (CHRONULAC) 10 GM/15ML solution TAKE 15ML BY MOUTH THREE TIMES DAILY, TITRATE FOR 3 TO 4 SOFT BOWEL MOVEMENTS A DAY 05/02/21   Annitta Needs, NP  Lancets (ACTI-LANCE 28G) MISC Use 3x per day as directed. 08/23/21   Elvia Collum M, DO  omeprazole (PRILOSEC) 40 MG capsule Take 1 capsule (40 mg total) by mouth in the morning and at bedtime. 08/07/22   Sherron Monday, NP  ondansetron (ZOFRAN-ODT) 4 MG disintegrating tablet DISSOLVE 1 TABLET ON THE TONGUE EVERY 8 HOURS AS NEEDED FOR NAUSEA Patient not taking: Reported on 08/07/2022 01/20/20   Annitta Needs, NP  PARoxetine (PAXIL) 40 MG tablet Take 1 tablet (40 mg total) by mouth daily. 08/23/21   Elvia Collum M, DO  potassium chloride SA (KLOR-CON) 20 MEQ tablet Take 1 tablet (20 mEq total) by mouth daily as needed (Take  when taking Lasix). 08/08/20   Roxan Hockey, MD  sildenafil (VIAGRA) 100 MG tablet Take 0.5 tablets (50 mg total) by mouth as needed for erectile dysfunction. 04/19/21   Elvia Collum M, DO  spironolactone (ALDACTONE) 100 MG tablet TAKE 1 TABLET(100 MG) BY MOUTH DAILY 02/12/22   Mahala Menghini, PA-C  sucralfate (CARAFATE) 1 g tablet TAKE 1 TABLET BY MOUTH BEFORE MEALS 10/19/21   Nilda Simmer, NP  thiamine 100 MG tablet Take 1 tablet (100 mg total) by mouth daily. 11/23/20   Carlis Stable, NP  XIFAXAN 550 MG TABS tablet TAKE 1 TABLET(550 MG) BY MOUTH TWICE DAILY 07/20/22   Mahala Menghini, PA-C    Physical Exam: Vitals:   08/16/22 1416 08/16/22 1427 08/16/22 1500 08/16/22 1707  BP:   109/69 94/62  Pulse:   95 80  Resp:   10 12  Temp:  97.6 F (36.4 C)    TempSrc:  Oral    SpO2:   100% 100%  Weight: 109.1 kg     Height: 6' (1.829 m)       Constitutional: Lethargic, answering questions, drifts off to sleep, not following directions.   Vitals:   08/16/22 1416 08/16/22 1427 08/16/22 1500 08/16/22 1707  BP:   109/69 94/62  Pulse:   95 80  Resp:   10 12  Temp:  97.6 F (36.4 C)    TempSrc:  Oral    SpO2:   100% 100%  Weight: 109.1 kg     Height: 6' (1.829 m)      Eyes: PERRL, lids and conjunctivae normal ENMT: Mucous membranes are moist.   Neck: normal, supple, no masses, no thyromegaly Respiratory: clear to auscultation bilaterally, no wheezing, no crackles.  Cardiovascular: Regular rate and rhythm, no murmurs / rubs / gallops.  Abdomen: Abdomen full, no tenderness, no masses palpated. No hepatosplenomegaly. Bowel sounds positive.  Musculoskeletal: no clubbing / cyanosis. No joint deformity upper and lower extremities.  Skin: no rashes, lesions, ulcers. No induration Neurologic: Exam limited, unable to fully examine strength, but moving all extremities to stimulation Psychiatric: Lethargic, unable to fully assess  Labs on Admission: I have personally reviewed following  labs and imaging studies  CBC: Recent Labs  Lab 08/16/22 1540  WBC 5.1  NEUTROABS 4.0  HGB 11.4*  HCT 33.9*  MCV 109.7*  PLT 79*   Basic Metabolic Panel: Recent Labs  Lab 08/16/22 1540  NA 133*  K 3.8  CL 111  CO2 15*  GLUCOSE 200*  BUN 59*  CREATININE 2.80*  CALCIUM 9.6   GFR: Estimated Creatinine Clearance: 38.9 mL/min (A) (by C-G formula based on SCr of 2.8 mg/dL (H)). Liver Function Tests: Recent Labs  Lab 08/16/22 1540  AST 43*  ALT 30  ALKPHOS 151*  BILITOT 2.2*  PROT 7.3  ALBUMIN 3.9   No results for input(s): "LIPASE", "AMYLASE" in the last 168 hours. Recent Labs  Lab 08/16/22 1540  AMMONIA 162*   Coagulation Profile: Recent Labs  Lab 08/16/22 1540  INR 1.4*   Radiological Exams on Admission: CT Head Wo Contrast  Result Date: 08/16/2022 CLINICAL DATA:  Altered mental status EXAM: CT HEAD WITHOUT CONTRAST TECHNIQUE: Contiguous axial images were obtained from the base of the skull through the vertex without intravenous contrast. RADIATION DOSE REDUCTION: This exam was performed according to the departmental dose-optimization program which includes automated exposure control, adjustment of the mA and/or kV according to patient size and/or use of iterative reconstruction technique. COMPARISON:  Brain CT 01/15/2014 FINDINGS: Brain: No evidence of acute infarction, hemorrhage, hydrocephalus, extra-axial collection or mass lesion/mass effect. Vascular: No hyperdense vessel or unexpected calcification. Skull: Normal. Negative for fracture or focal lesion. Sinuses/Orbits: Mild paranasal sinus mucosal thickening. No air-fluid levels. Other: None. IMPRESSION: No acute intracranial process Electronically Signed   By: Lovey Newcomer M.D.   On: 08/16/2022 17:15   DG Chest Port 1 View  Result Date: 08/16/2022 CLINICAL DATA:  Altered mental status.  Prior thoracentesis EXAM: PORTABLE CHEST 1 VIEW COMPARISON:  chest radiograph November 16, 2020 FINDINGS: Monitoring leads  overlie the patient. Stable cardiac and mediastinal contours. No consolidative pulmonary opacities. No pleural effusion or pneumothorax. Thoracic spine degenerative changes. IMPRESSION: No acute cardiopulmonary process. Electronically Signed   By: Lovey Newcomer M.D.   On: 08/16/2022 15:18   US Paracentesis  Result Date: 08/16/2022 INDICATION: Recurrent ascites; Cirrhosis EXAM: ULTRASOUND GUIDED LLQ PARACENTESIS MEDICATIONS: 10 cc 1% lidocaine COMPLICATIONS: None immediate. PROCEDURE: Informed written consent was obtained from the patient after a discussion of the risks, benefits and alternatives to treatment. A timeout was performed prior to the initiation of the procedure. Initial ultrasound scanning demonstrates a large amount of ascites within the left lower abdominal quadrant. The left lower abdomen was prepped and draped in the usual sterile fashion. 1% lidocaine was used for local anesthesia. Following this, a Yueh catheter was introduced. An ultrasound image was saved for documentation purposes. The paracentesis was performed. The catheter was removed and a dressing was applied. The patient tolerated the procedure well without immediate post procedural complication. Patient received post-procedure intravenous albumin; see nursing notes for details. FINDINGS: A total of approximately 6 liters of cloudy yellow fluid was removed. Samples were sent to the laboratory as requested by the clinical team. IMPRESSION: Successful ultrasound-guided paracentesis yielding 6 liters of peritoneal fluid. Read by Lavonia Drafts [facility] Electronically Signed   By: Lavonia Dana M.D.   On: 08/16/2022 10:46    EKG: Independently reviewed.  Sinus rhythm rate 79, QTc 484.  Assessment/Plan Principal Problem:   Hepatic encephalopathy (HCC) Active Problems:   Hypertension   Alcoholic cirrhosis of liver with ascites (HCC)   Portal hypertension (HCC)   Thrombocytopenia (HCC)   Diabetes ([place])   Gastric varices   AKI (acute  kidney injury) ([place])   Assessment and Plan: * Hepatic encephalopathy (HCC) Lethargy, on lactulose and rifaximin.  Spouse reports compliance.  Head CT unremarkable.  Chest x-ray  clear.  Paracentesis today, fluid analysis not suggestive of infectious at this time. -Lactulose enema given in ED, resume home lactulose -Resume rifaximin -Follow-up culture results from ascitic fluid analysis today  AKI (acute kidney injury) ([place]) Baseline appears to be 0.6-0.8.  He is on diuretics.  And frequently gets large-volume paracentesis.  Question hepatorenal syndrome, versus prerenal from diuretics. -1 L bolus given in ED- -hold spironolactone, Bumex for now   Diabetes ([place]) Blood glucose 200.  A1c 5.6. - SSi- s  Thrombocytopenia (HCC) Platelets 79.  Recent baseline 71 239. -SCDs  Hypertension Blood pressure soft. -Hold spironolactone and Bumex for now.    DVT prophylaxis: SCDS Code Status: Full Code Family Communication: None at bedside Disposition Plan: ~ 2 days Consults called: None Admission status:  Obs tele  Author: Bethena Roys, MD 08/16/2022 9:46 PM  For on call review [email or web address].

## 2022-08-16 NOTE — Procedures (Signed)
   US guided LLQ paracentesis  6 liters cloudy yellow fluid Sent for labs per MD Tolerated well  EBL: None

## 2022-08-16 NOTE — Assessment & Plan Note (Signed)
Blood pressure soft. -Hold spironolactone and Bumex for now.

## 2022-08-16 NOTE — ED Notes (Signed)
Christy, RN, SWAT at bedside attempting to start another IV.

## 2022-08-16 NOTE — ED Notes (Signed)
Called AC to bring Flexi-Seal for pt's lactulose enema administration

## 2022-08-16 NOTE — Telephone Encounter (Signed)
Also per [facility] notes for transplant "Telephone Encounter - Leatrice Jewels - 08/02/2022 1:06 PM EDT Formatting of this note might be different from the original. Received a call from [place] with [facility] who states that per patients plan he is unable to received care at [facility] for Liver transplant services as he does not have any transplant benefits and has to go to a [facility] center. Janett Billow can be reached at [phone] ext [phone].   --   FYI to [name], wife was already made aware

## 2022-08-16 NOTE — ED Triage Notes (Signed)
Pt arrived via [facility] from home with c/o high ammonia levels and AMS. Pt was here in short stay earlier today for his weekly cardiocentesis. Per EMS, pt's wife stated that when he got back home from procedure today, he took a nap and woke up altered, which is also typical for him according to EMS. Hx of liver failure

## 2022-08-17 ENCOUNTER — Telehealth: Payer: Self-pay | Admitting: Gastroenterology

## 2022-08-17 DIAGNOSIS — J449 Chronic obstructive pulmonary disease, unspecified: Secondary | ICD-10-CM | POA: Diagnosis present

## 2022-08-17 DIAGNOSIS — Z8249 Family history of ischemic heart disease and other diseases of the circulatory system: Secondary | ICD-10-CM | POA: Diagnosis not present

## 2022-08-17 DIAGNOSIS — Z7984 Long term (current) use of oral hypoglycemic drugs: Secondary | ICD-10-CM | POA: Diagnosis not present

## 2022-08-17 DIAGNOSIS — K7682 Hepatic encephalopathy: Secondary | ICD-10-CM | POA: Diagnosis present

## 2022-08-17 DIAGNOSIS — K2100 Gastro-esophageal reflux disease with esophagitis, without bleeding: Secondary | ICD-10-CM | POA: Diagnosis present

## 2022-08-17 DIAGNOSIS — K7210 Chronic hepatic failure without coma: Secondary | ICD-10-CM | POA: Diagnosis present

## 2022-08-17 DIAGNOSIS — Z8371 Family history of colonic polyps: Secondary | ICD-10-CM | POA: Diagnosis not present

## 2022-08-17 DIAGNOSIS — E119 Type 2 diabetes mellitus without complications: Secondary | ICD-10-CM | POA: Diagnosis present

## 2022-08-17 DIAGNOSIS — Z794 Long term (current) use of insulin: Secondary | ICD-10-CM | POA: Diagnosis not present

## 2022-08-17 DIAGNOSIS — F419 Anxiety disorder, unspecified: Secondary | ICD-10-CM | POA: Diagnosis present

## 2022-08-17 DIAGNOSIS — D175 Benign lipomatous neoplasm of intra-abdominal organs: Secondary | ICD-10-CM | POA: Diagnosis present

## 2022-08-17 DIAGNOSIS — Z79899 Other long term (current) drug therapy: Secondary | ICD-10-CM | POA: Diagnosis not present

## 2022-08-17 DIAGNOSIS — K3189 Other diseases of stomach and duodenum: Secondary | ICD-10-CM | POA: Diagnosis present

## 2022-08-17 DIAGNOSIS — N179 Acute kidney failure, unspecified: Secondary | ICD-10-CM | POA: Diagnosis present

## 2022-08-17 DIAGNOSIS — K767 Hepatorenal syndrome: Secondary | ICD-10-CM | POA: Diagnosis present

## 2022-08-17 DIAGNOSIS — Z808 Family history of malignant neoplasm of other organs or systems: Secondary | ICD-10-CM | POA: Diagnosis not present

## 2022-08-17 DIAGNOSIS — I10 Essential (primary) hypertension: Secondary | ICD-10-CM | POA: Diagnosis present

## 2022-08-17 DIAGNOSIS — I864 Gastric varices: Secondary | ICD-10-CM | POA: Diagnosis present

## 2022-08-17 DIAGNOSIS — K766 Portal hypertension: Secondary | ICD-10-CM | POA: Diagnosis present

## 2022-08-17 DIAGNOSIS — I8510 Secondary esophageal varices without bleeding: Secondary | ICD-10-CM | POA: Diagnosis present

## 2022-08-17 DIAGNOSIS — K7031 Alcoholic cirrhosis of liver with ascites: Secondary | ICD-10-CM | POA: Diagnosis present

## 2022-08-17 DIAGNOSIS — F17210 Nicotine dependence, cigarettes, uncomplicated: Secondary | ICD-10-CM | POA: Diagnosis present

## 2022-08-17 DIAGNOSIS — D696 Thrombocytopenia, unspecified: Secondary | ICD-10-CM | POA: Diagnosis present

## 2022-08-17 DIAGNOSIS — D509 Iron deficiency anemia, unspecified: Secondary | ICD-10-CM | POA: Diagnosis present

## 2022-08-17 LAB — BASIC METABOLIC PANEL
Anion gap: 8 (ref 5–15)
BUN: 63 mg/dL — ABNORMAL HIGH (ref 6–20)
CO2: 13 mmol/L — ABNORMAL LOW (ref 22–32)
Calcium: 9.7 mg/dL (ref 8.9–10.3)
Chloride: 115 mmol/L — ABNORMAL HIGH (ref 98–111)
Creatinine, Ser: 2.62 mg/dL — ABNORMAL HIGH (ref 0.61–1.24)
GFR, Estimated: 28 mL/min — ABNORMAL LOW (ref >60)
Glucose, Bld: 123 mg/dL — ABNORMAL HIGH (ref 70–99)
Potassium: 3.9 mmol/L (ref 3.5–5.1)
Sodium: 136 mmol/L (ref 135–145)

## 2022-08-17 LAB — CBC
HCT: 32.2 % — ABNORMAL LOW (ref 39.0–52.0)
Hemoglobin: 11.2 g/dL — ABNORMAL LOW (ref 13.0–17.0)
MCH: 37.3 pg — ABNORMAL HIGH (ref 26.0–34.0)
MCHC: 34.8 g/dL (ref 30.0–36.0)
MCV: 107.3 fL — ABNORMAL HIGH (ref 80.0–100.0)
Platelets: 77 10*3/uL — ABNORMAL LOW (ref 150–400)
RBC: 3 MIL/uL — ABNORMAL LOW (ref 4.22–5.81)
RDW: 14.6 % (ref 11.5–15.5)
WBC: 4.5 10*3/uL (ref 4.0–10.5)
nRBC: 0 % (ref 0.0–0.2)

## 2022-08-17 LAB — HIV ANTIBODY (ROUTINE TESTING W REFLEX): HIV Screen 4th Generation wRfx: NONREACTIVE

## 2022-08-17 LAB — AMMONIA: Ammonia: 94 umol/L — ABNORMAL HIGH (ref 9–35)

## 2022-08-17 LAB — CYTOLOGY - NON PAP

## 2022-08-17 MED ORDER — ORAL CARE MOUTH RINSE: 15.0000 mL | OROMUCOSAL | Status: DC | PRN

## 2022-08-17 MED ORDER — ALBUMIN HUMAN 25 % IV SOLN
50.0000 g | Freq: Three times a day (TID) | INTRAVENOUS | Status: AC
Start: 2022-08-17 — End: 2022-08-18
  Administered 2022-08-17 – 2022-08-18 (×3): 50 g via INTRAVENOUS
  Filled 2022-08-17 (×3): qty 200

## 2022-08-17 MED ORDER — VITAMIN B1 100 MG PO TABS
100.0000 mg | ORAL_TABLET | Freq: Every day | ORAL | Status: DC
  Administered 2022-08-17 – 2022-08-20 (×4): 100 mg via ORAL
  Filled 2022-08-17 (×10): qty 1

## 2022-08-17 MED ORDER — SPIRONOLACTONE 25 MG PO TABS
100.0000 mg | ORAL_TABLET | Freq: Every day | ORAL | Status: DC
  Administered 2022-08-17: 100 mg via ORAL
  Filled 2022-08-17: qty 4

## 2022-08-17 MED ORDER — LACTULOSE 10 GM/15ML PO SOLN
30.0000 g | Freq: Three times a day (TID) | ORAL | Status: DC
  Administered 2022-08-17 – 2022-08-18 (×3): 30 g via ORAL
  Filled 2022-08-17 (×4): qty 60

## 2022-08-17 MED ORDER — PAROXETINE HCL 20 MG PO TABS
40.0000 mg | ORAL_TABLET | Freq: Every day | ORAL | Status: DC
  Administered 2022-08-17 – 2022-08-20 (×4): 40 mg via ORAL
  Filled 2022-08-17 (×4): qty 2

## 2022-08-17 MED ORDER — SUCRALFATE 1 G PO TABS
1.0000 g | ORAL_TABLET | Freq: Three times a day (TID) | ORAL | Status: DC
  Administered 2022-08-17 – 2022-08-20 (×14): 1 g via ORAL
  Filled 2022-08-17 (×14): qty 1

## 2022-08-17 NOTE — Progress Notes (Signed)
Patients wife [name] Cookson answered majority of patients screening questions. Patient is not alert enough to answer. Continued to monitor patient through out the shift. Patient did wake up enough to take lactulose and Rifaximin. Wife is at bedside.

## 2022-08-17 NOTE — Progress Notes (Signed)
PROGRESS NOTE    Bradley Miles  BMW:[id number] DOB: [birth year] DOA: 08/16/2022 PCP: [name], MD   Brief Narrative:  Bradley Miles is a 56 y.o. male with medical history significant for alcoholic liver cirrhosis, diabetes mellitus, hypertension, varices, COPD. Patient was brought to the ED reports of altered mental status.  Patient had paracentesis today, and on getting home, he took a nap and subsequently woke up confused and somnolent.  Patient was admitted with acute hepatic encephalopathy as well as AKI likely hepatorenal and in the setting of large volume paracentesis.  Assessment & Plan:   Principal Problem:   Hepatic encephalopathy (HCC) Active Problems:   Hypertension   Alcoholic cirrhosis of liver with ascites (HCC)   Portal hypertension (HCC)   Thrombocytopenia (HCC)   Diabetes ([place])   Gastric varices   Acute kidney injury ([place])  Assessment and Plan:   Hepatic encephalopathy (HCC) Lethargy, on lactulose and rifaximin.  Spouse reports compliance.  Head CT unremarkable.  Chest x-ray clear.  Paracentesis today, fluid analysis not suggestive of infectious at this time. -Lactulose enema given in ED, resume home lactulose -Resume rifaximin -Follow-up culture results from ascitic fluid analysis today -Ammonia levels downtrending -Appreciate GI evaluation and set up for follow-up at liver transplant center at [facility]   AKI (acute kidney injury) ([place]) Baseline appears to be 0.6-0.8.  He is on diuretics.  And frequently gets large-volume paracentesis.  Question hepatorenal syndrome, versus prerenal from diuretics. -1 L bolus given in ED- -hold spironolactone, Bumex for now -Creatinine currently downtrending, monitor strict I's and O's -Possible need for nephrology evaluation in a.m. depending on trend     Diabetes ([place]) Blood glucose 200.  A1c 5.6. - SSi- s   Thrombocytopenia (HCC) -Due to liver disease -SCDs   Hypertension Blood pressure soft. -Hold  spironolactone and Bumex for now.     DVT prophylaxis:SCDs Code Status: Full Family Communication:Wife and sister at bedside 9/8  Disposition Plan:  Status is: Inpatient Remains inpatient appropriate because: IV medications/close monitoring  Consultants:  GI  Procedures:  None  Antimicrobials:  None   Subjective: Patient seen and evaluated today and still appears quite lethargic.  Objective: Vitals:   08/16/22 1859 08/16/22 2036 08/16/22 2045 08/17/22 0552  BP:   108/65 117/70  Pulse:   88 95  Resp:   15 16  Temp: 97.7 F (36.5 C)  (!) 97.5 F (36.4 C) 98.1 F (36.7 C)  TempSrc: Oral  Oral Oral  SpO2:   100% 100%  Weight:  100 kg    Height:        Intake/Output Summary (Last 24 hours) at 08/17/2022 1248 Last data filed at 08/17/2022 0900 Gross per 24 hour  Intake 360 ml  Output --  Net 360 ml   Filed Weights   08/16/22 1416 08/16/22 2036  Weight: 109.1 kg 100 kg    Examination:  General exam: Appears lethargic Respiratory system: Clear to auscultation. Respiratory effort normal. Cardiovascular system: S1 & S2 heard, RRR.  Gastrointestinal system: Abdomen is soft Central nervous system: Alert and awake Extremities: No edema Skin: No significant lesions noted Psychiatry: Flat affect.    Data Reviewed: I have personally reviewed following labs and imaging studies  CBC: Recent Labs  Lab 08/16/22 1540 08/17/22 0516  WBC 5.1 4.5  NEUTROABS 4.0  --   HGB 11.4* 11.2*  HCT 33.9* 32.2*  MCV 109.7* 107.3*  PLT 79* 77*   Basic Metabolic Panel: Recent Labs  Lab 08/16/22  1540 08/17/22 0516  NA 133* 136  K 3.8 3.9  CL 111 115*  CO2 15* 13*  GLUCOSE 200* 123*  BUN 59* 63*  CREATININE 2.80* 2.62*  CALCIUM 9.6 9.7   GFR: Estimated Creatinine Clearance: 39.9 mL/min (A) (by C-G formula based on SCr of 2.62 mg/dL (H)). Liver Function Tests: Recent Labs  Lab 08/16/22 1540  AST 43*  ALT 30  ALKPHOS 151*  BILITOT 2.2*  PROT 7.3  ALBUMIN 3.9    No results for input(s): "LIPASE", "AMYLASE" in the last 168 hours. Recent Labs  Lab 08/16/22 1540 08/17/22 0957  AMMONIA 162* 94*   Coagulation Profile: Recent Labs  Lab 08/16/22 1540  INR 1.4*   Cardiac Enzymes: No results for input(s): "CKTOTAL", "CKMB", "CKMBINDEX", "TROPONINI" in the last 168 hours. BNP (last 3 results) No results for input(s): "PROBNP" in the last 8760 hours. HbA1C: No results for input(s): "HGBA1C" in the last 72 hours. CBG: No results for input(s): "GLUCAP" in the last 168 hours. Lipid Profile: No results for input(s): "CHOL", "HDL", "LDLCALC", "TRIG", "CHOLHDL", "LDLDIRECT" in the last 72 hours. Thyroid Function Tests: No results for input(s): "TSH", "T4TOTAL", "FREET4", "T3FREE", "THYROIDAB" in the last 72 hours. Anemia Panel: No results for input(s): "VITAMINB12", "FOLATE", "FERRITIN", "TIBC", "IRON", "RETICCTPCT" in the last 72 hours. Sepsis Labs: No results for input(s): "PROCALCITON", "LATICACIDVEN" in the last 168 hours.  Recent Results (from the past 240 hour(s))  Culture, body fluid w Gram Stain-bottle     Status: None   Collection Time: 08/09/22  8:14 AM   Specimen: Ascitic  Result Value Ref Range Status   Specimen Description ASCITIC  Final   Special Requests BOTTLES DRAWN AEROBIC AND ANAEROBIC 10CC  Final   Culture   Final    NO GROWTH 5 DAYS Performed at [facility], [address], [place], [place] [address]    Report Status 08/14/2022 FINAL  Final  Gram stain     Status: None   Collection Time: 08/09/22  8:14 AM   Specimen: Ascitic  Result Value Ref Range Status   Specimen Description ASCITIC  Final   Special Requests NONE  Final   Gram Stain   Final    NO ORGANISMS SEEN WBC PRESENT, PREDOMINANTLY MONONUCLEAR CYTOSPIN SMEAR Performed at [facility], [address], [place], [place] [address]    Report Status 08/09/2022 FINAL  Final  Gram stain     Status: None   Collection Time: 08/16/22  9:37 AM   Specimen:  Peritoneal Washings  Result Value Ref Range Status   Specimen Description PERITONEAL  Final   Special Requests NONE  Final   Gram Stain   Final    WBC PRESENT, PREDOMINANTLY MONONUCLEAR NO ORGANISMS SEEN CYTOSPIN SMEAR Performed at [facility], [address] [place], [place] [address]    Report Status 08/16/2022 FINAL  Final         Radiology Studies: CT Head Wo Contrast  Result Date: 08/16/2022 CLINICAL DATA:  Altered mental status EXAM: CT HEAD WITHOUT CONTRAST TECHNIQUE: Contiguous axial images were obtained from the base of the skull through the vertex without intravenous contrast. RADIATION DOSE REDUCTION: This exam was performed according to the departmental dose-optimization program which includes automated exposure control, adjustment of the mA and/or kV according to patient size and/or use of iterative reconstruction technique. COMPARISON:  Brain CT 01/15/2014 FINDINGS: Brain: No evidence of acute infarction, hemorrhage, hydrocephalus, extra-axial collection or mass lesion/mass effect. Vascular: No hyperdense vessel or unexpected calcification. Skull: Normal.  Negative for fracture or focal lesion. Sinuses/Orbits: Mild paranasal sinus mucosal thickening. No air-fluid levels. Other: None. IMPRESSION: No acute intracranial process Electronically Signed   By: Lovey Newcomer M.D.   On: 08/16/2022 17:15   DG Chest Port 1 View  Result Date: 08/16/2022 CLINICAL DATA:  Altered mental status.  Prior thoracentesis EXAM: PORTABLE CHEST 1 VIEW COMPARISON:  chest radiograph November 16, 2020 FINDINGS: Monitoring leads overlie the patient. Stable cardiac and mediastinal contours. No consolidative pulmonary opacities. No pleural effusion or pneumothorax. Thoracic spine degenerative changes. IMPRESSION: No acute cardiopulmonary process. Electronically Signed   By: Lovey Newcomer M.D.   On: 08/16/2022 15:18   US Paracentesis  Result Date: 08/16/2022 INDICATION: Recurrent ascites; Cirrhosis EXAM:  ULTRASOUND GUIDED LLQ PARACENTESIS MEDICATIONS: 10 cc 1% lidocaine COMPLICATIONS: None immediate. PROCEDURE: Informed written consent was obtained from the patient after a discussion of the risks, benefits and alternatives to treatment. A timeout was performed prior to the initiation of the procedure. Initial ultrasound scanning demonstrates a large amount of ascites within the left lower abdominal quadrant. The left lower abdomen was prepped and draped in the usual sterile fashion. 1% lidocaine was used for local anesthesia. Following this, a Yueh catheter was introduced. An ultrasound image was saved for documentation purposes. The paracentesis was performed. The catheter was removed and a dressing was applied. The patient tolerated the procedure well without immediate post procedural complication. Patient received post-procedure intravenous albumin; see nursing notes for details. FINDINGS: A total of approximately 6 liters of cloudy yellow fluid was removed. Samples were sent to the laboratory as requested by the clinical team. IMPRESSION: Successful ultrasound-guided paracentesis yielding 6 liters of peritoneal fluid. Read by Lavonia Drafts [facility] Electronically Signed   By: Lavonia Dana M.D.   On: 08/16/2022 10:46        Scheduled Meds:  lactulose  30 g Oral TID   pantoprazole  40 mg Oral Daily   PARoxetine  40 mg Oral Daily   rifaximin  550 mg Oral BID   sucralfate  1 g Oral TID WC & HS   thiamine  100 mg Oral Daily     LOS: 0 days    Time spent: 35 minutes    Hetal Proano Darleen Crocker, DO [facility]  If 7PM-7AM, please contact night-coverage [email or web address] 08/17/2022, 12:48 PM

## 2022-08-17 NOTE — Telephone Encounter (Signed)
Bradley Flavors, NP.   Patient hospitalized after last LVAP due to hepatic encephalopathy and further decline in this renal function. Family requesting help to expedite [facility] appt, they have not heard anything from referral. [facility] liver transplant denied due to lack of transplant benefits.   Consider reducing amount of fluid drawn off at time of paracentesis given recent events. May need to cap at 4 liters.      Mindy,  Can you check on the referral to [facility] for transplant evaluation?

## 2022-08-17 NOTE — Progress Notes (Signed)
  Transition of Care [facility]) Screening Note   Patient Details  Name: Bradley Miles Date of Birth: 01/08/1966   Transition of Care [facility]) CM/SW Contact:    Iona Beard, [place] Phone Number: 08/17/2022, 11:26 AM    Transition of Care Department [facility]) has reviewed patient and no TOC needs have been identified at this time. We will continue to monitor patient advancement through interdisciplinary progression rounds. If new patient transition needs arise, please place a TOC consult.

## 2022-08-17 NOTE — Consult Note (Signed)
Gastroenterology Consult   Referring Provider: No ref. provider found Primary Care Physician:  [name], MD Primary Gastroenterologist:  Garfield Cornea, MD  Patient ID: Bradley [place]; [id number]; Dec 08, 1966   Admit date: 08/16/2022  LOS: 0 days   Date of Consultation: 08/17/2022  Reason for Consultation: Hepatic encephalopathy    History of Present Illness   Bradley [place] is a 56 y.o. male past medical history significant for EtOH related cirrhosis complicated by ascites/hepatic encephalopathy/portal hypertension/recurrent bleeding secondary to gastric varices requiring multiple sessions of variceal banding along with unsuccessful TIPS procedures, diabetes, hypertension, COPD brought to the ED yesterday afternoon via EMS for altered mental status.   Patient had been at [facility] earlier yesterday for paracentesis. He had 6 liters of fluid removed. Initial fluid analysis without signs of SBP. He went home after tap and took a nap. According to wife, when he woke up he was somnolent and confused. He has not had fever, melena, brbpr. He has intermittent vomiting. No hematemesis.  Last etoh 12/25/21.  Today, patient was more alert for breakfast per nursing. He ate BF, took his medications. He has been having stool around his rectal tube. Some irritation of the perineum per nursing.   In the ED: Chest x-ray with no acute findings.  CT head with no acute findings.  Sodium 133, creatinine 2.8 up from 2.35 10 days prior.  August 03, 2022, creatinine 1.8 creatinine.  June 2023, creatinine 1.31. October 2022, creatinine normal.  Total bilirubin 2.2, alkaline phosphatase 151, AST 43, ALT 30, albumin 3.9.  White blood cell count 5100, hemoglobin 11.4, MCV 109.7, platelets 79,000.  Ammonia 162.  INR 1.4.  Today's creatinine is down to 2.62, BUN 63, sodium 136.  Hemoglobin 11.2, platelets 77,000.  Patient's course has been complicated this year.  See office note from August 07, 2022 by Venetia Night,  NP for outline/details.  He had been in [place] where he spent 6 months in a treatment facility.  Last alcoholic beverage on December 25, 2021.  History of unsuccessful TIPS, documented no flow in 12/2021. New TIPS 01/2022. Within few days, noted poor flow, ultimately documented extensive thrombus. At that time liver transplant evaluation was advised and he started process while in [place]. Multiple admissions in [place] for ascites, hepatic encephalopathy, and acute kidney injury.  No reported SBP.  Patient has been advised due to acute kidney injury, not to have more than 4 L of fluid removed at time.   Colonoscopy July 2022: Diverticulosis in the entire examined colon, colon lipoma, ten 3-8 mm polyps in the sigmoid and descending colon with pathology consistent for tubular adenomas.  Advise repeat in 3 years (2025).   EGD December 2021: Normal esophagus, large volume of clotted blood and red blood in the cardia, gastric fundus and in proximal gastric body that could not be cleared due to volume of active bleeding and large size clots, normal duodenum. Source of bleeding not located    Prior to Admission medications   Medication Sig Start Date End Date Taking? Authorizing Provider  allopurinol (ZYLOPRIM) 100 MG tablet Take 1 tablet (100 mg total) by mouth daily. 08/23/21  Yes Lovena Le, Malena M, DO  folic acid (FOLVITE) 1 MG tablet Take 1 mg by mouth daily. 06/27/22  Yes [provider]  gabapentin (NEURONTIN) 600 MG tablet Take 600 mg by mouth every 8 (eight) hours. 07/20/22  Yes [provider]  lactulose (CHRONULAC) 10 GM/15ML solution TAKE 15ML BY MOUTH THREE TIMES  DAILY, TITRATE FOR 3 TO 4 SOFT BOWEL MOVEMENTS A DAY "25m 3-5 times daily" 05/02/21  Yes BAnnitta Needs NP  omeprazole (PRILOSEC) 40 MG capsule Take 1 capsule (40 mg total) by mouth in the morning and at bedtime. 08/07/22  Yes Mahon, CLenise Arena NP  PARoxetine (PAXIL) 40 MG tablet Take 1 tablet (40 mg total) by mouth daily.  08/23/21  Yes TLovena Le Malena M, DO  potassium chloride SA (KLOR-CON) 20 MEQ tablet Take 1 tablet (20 mEq total) by mouth daily as needed (Take when taking Lasix). 08/08/20  Yes Emokpae, Courage, MD  sildenafil (VIAGRA) 100 MG tablet Take 0.5 tablets (50 mg total) by mouth as needed for erectile dysfunction. 04/19/21  Yes TLovena Le Malena M, DO  spironolactone (ALDACTONE) 100 MG tablet TAKE 1 TABLET(100 MG) BY MOUTH DAILY 02/12/22  Yes LMahala Menghini PA-C  sucralfate (CARAFATE) 1 g tablet TAKE 1 TABLET BY MOUTH BEFORE MEALS 10/19/21  Yes HNilda Simmer NP  thiamine 100 MG tablet Take 1 tablet (100 mg total) by mouth daily. 11/23/20  Yes GCarlis Stable NP  XIFAXAN 550 MG TABS tablet TAKE 1 TABLET(550 MG) BY MOUTH TWICE DAILY 07/20/22  Yes LMahala Menghini PA-C  blood glucose meter kit and supplies Dispense based on patient and insurance preference. Test blood sugar once daily.  (FOR ICD-10 E10.9, E11.9). Patient not taking: Reported on 08/17/2022 04/21/20   TElvia CollumM, DO  bumetanide (BUMEX) 1 MG tablet Take 1 mg by mouth daily. Patient not taking: Reported on 08/17/2022    [provider]  glucose blood (TRUE METRIX BLOOD GLUCOSE TEST) test strip Use as instructed Patient not taking: Reported on 08/17/2022 08/23/21   TElvia CollumM, DO  Insulin Pen Needle (NOVOFINE) 30G X 8 MM MISC Inject 10 each into the skin as needed. Patient not taking: Reported on 08/17/2022 08/23/21   TElvia CollumM, DO  Lancets (ACTI-LANCE 28G) MISC Use 3x per day as directed. Patient not taking: Reported on 08/17/2022 08/23/21   TElvia CollumM, DO  ondansetron (ZOFRAN-ODT) 4 MG disintegrating tablet DISSOLVE 1 TABLET ON THE TONGUE EVERY 8 HOURS AS NEEDED FOR NAUSEA Patient not taking: Reported on 08/07/2022 01/20/20   BAnnitta Needs NP    Current Facility-Administered Medications  Medication Dose Route Frequency Provider Last Rate Last Admin   acetaminophen (TYLENOL) tablet 650 mg  650 mg Oral Q6H PRN Emokpae,  Ejiroghene E, MD       Or   acetaminophen (TYLENOL) suppository 650 mg  650 mg Rectal Q6H PRN Emokpae, Ejiroghene E, MD       lactulose (CHRONULAC) 10 GM/15ML solution 10 g  10 g Oral TID Emokpae, Ejiroghene E, MD   10 g at 08/17/22 0920   ondansetron (ZOFRAN) tablet 4 mg  4 mg Oral Q6H PRN Emokpae, Ejiroghene E, MD       Or   ondansetron (ZOFRAN) injection 4 mg  4 mg Intravenous Q6H PRN Emokpae, Ejiroghene E, MD       pantoprazole (PROTONIX) EC tablet 40 mg  40 mg Oral Daily Emokpae, Ejiroghene E, MD   40 mg at 08/17/22 0920   rifaximin (XIFAXAN) tablet 550 mg  550 mg Oral BID Emokpae, Ejiroghene E, MD   550 mg at 08/17/22 0920    Allergies as of 08/16/2022   (No Known Allergies)    Past Medical History:  Diagnosis Date   Alcoholic cirrhosis ([place]    Anxiety    Controlled type 2 diabetes  mellitus with complication, without long-term current use of insulin ([place]) 06/26/2020   Enlarged liver    GERD (gastroesophageal reflux disease)    Hypertension    Palpitations    PVC's (premature ventricular contractions)    Shortness of breath    Tussive syncope 12/22/2019   Varicose veins     Past Surgical History:  Procedure Laterality Date   BIOPSY  02/02/2019   Procedure: BIOPSY;  Surgeon: Daneil Dolin, MD;  [facility];  Service: Endoscopy;;  gastric   COLONOSCOPY WITH ESOPHAGOGASTRODUODENOSCOPY (EGD)  12/16/2012   internal hemorrhoids, colonic diverticulosis, benign polyps, screening in 2024. EGD with mild erosive reflux esophagitis, small hiatal hernia, negative H.pylori   COLONOSCOPY WITH PROPOFOL N/A 06/22/2021   Procedure: COLONOSCOPY WITH PROPOFOL;  Surgeon: Daneil Dolin, MD;  [facility];  Service: Endoscopy;  Laterality: N/A;  1:45pm   cyst reomved     from spine   ESOPHAGOGASTRODUODENOSCOPY  05/19/09   RMR: Geographic distal esophageal erosions consistent with severe erosive reflux esophagitis. schatzki's ring s/P dilation/small hiatal hernia  otherwise normal stomach   ESOPHAGOGASTRODUODENOSCOPY (EGD) WITH PROPOFOL N/A 02/02/2019   Dr. Gala Romney: retained gastric contents. portal HTN gastropathy   ESOPHAGOGASTRODUODENOSCOPY (EGD) WITH PROPOFOL N/A 11/20/2019   Procedure: ESOPHAGOGASTRODUODENOSCOPY (EGD) WITH PROPOFOL;  Surgeon: Daneil Dolin, MD;  [facility];  Service: Endoscopy;  Laterality: N/A;   ESOPHAGOGASTRODUODENOSCOPY (EGD) WITH PROPOFOL N/A 11/24/2019   Procedure: ESOPHAGOGASTRODUODENOSCOPY (EGD) WITH PROPOFOL;  Surgeon: Ronnette Juniper, MD;  Location: [place];  Service: Gastroenterology;  Laterality: N/A;   ESOPHAGOGASTRODUODENOSCOPY (EGD) WITH PROPOFOL N/A 08/08/2020   Procedure: ESOPHAGOGASTRODUODENOSCOPY (EGD) WITH PROPOFOL;  Surgeon: Daneil Dolin, MD;  [facility];  Service: Endoscopy;  Laterality: N/A;   ESOPHAGOGASTRODUODENOSCOPY (EGD) WITH PROPOFOL N/A 11/15/2020   Procedure: ESOPHAGOGASTRODUODENOSCOPY (EGD) WITH PROPOFOL;  Surgeon: Ladene Artist, MD;  Location: [facility] ENDOSCOPY;  Service: Endoscopy;  Laterality: N/A;   IR ANGIOGRAM SELECTIVE EACH ADDITIONAL VESSEL  11/22/2019   IR ANGIOGRAM SELECTIVE EACH ADDITIONAL VESSEL  11/24/2019   IR ANGIOGRAM SELECTIVE EACH ADDITIONAL VESSEL  11/24/2019   IR ANGIOGRAM SELECTIVE EACH ADDITIONAL VESSEL  11/24/2019   IR ANGIOGRAM SELECTIVE EACH ADDITIONAL VESSEL  11/15/2020   IR EMBO ART  VEN HEMORR LYMPH EXTRAV  INC GUIDE ROADMAPPING  11/22/2019   IR EMBO ART  VEN HEMORR LYMPH EXTRAV  INC GUIDE ROADMAPPING  11/24/2019   IR EMBO ART  VEN HEMORR LYMPH EXTRAV  INC GUIDE ROADMAPPING  11/15/2020   IR FLUORO GUIDE CV LINE RIGHT  11/15/2020   IR RADIOLOGIST EVAL & MGMT  12/29/2019   IR RADIOLOGIST EVAL & MGMT  10/27/2020   IR RADIOLOGIST EVAL & MGMT  04/19/2021   IR TIPS  11/24/2019   IR US GUIDE VASC ACCESS RIGHT  11/22/2019   IR US GUIDE VASC ACCESS RIGHT  11/15/2020   IR US GUIDE VASC ACCESS RIGHT  11/15/2020   IR VENOGRAM RENAL UNI LEFT  11/22/2019   IR  VENOGRAM RENAL UNI LEFT  11/15/2020   POLYPECTOMY  06/22/2021   Procedure: POLYPECTOMY;  Surgeon: Daneil Dolin, MD;  [facility];  Service: Endoscopy;;   RADIOLOGY WITH ANESTHESIA N/A 11/24/2019   Procedure: IR WITH ANESTHESIA;  Surgeon: Radiologist, Medication, MD;  Location: [place];  Service: Radiology;  Laterality: N/A;   RADIOLOGY WITH ANESTHESIA N/A 11/15/2020   Procedure: IR WITH ANESTHESIA;  Surgeon: Suzette Battiest, MD;  Location: [place];  Service: Radiology;  Laterality: N/A;   TIPS PROCEDURE N/A 11/22/2019   Procedure: BRTO/TRANS-JUGULAR INTRAHEPATIC PORTAL SHUNT (TIPS);  Surgeon: Corrie Mckusick, DO;  Location: [place];  Service: Anesthesiology;  Laterality: N/A;   WISDOM TOOTH EXTRACTION      Family History  Problem Relation Age of Onset   Cancer Mother        ? 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   Right-handed.   No daily use of caffeine.   Social Determinants of Health   Financial Resource Strain: Not on file  Food Insecurity: Not on file   Transportation Needs: Not on file  Physical Activity: Not on file  Stress: Not on file  Social Connections: Not on file  Intimate Partner Violence: Not on file     Review of System:   Somnolent and unable to stay away to answer questions  General: Negative for anorexia, weight loss, fever, chills, fatigue, weakness. Eyes: Negative for vision changes.  ENT: Negative for hoarseness, difficulty swallowing , nasal congestion. CV: Negative for chest pain, angina, palpitations, dyspnea on exertion, peripheral edema.  Respiratory: Negative for dyspnea at rest, dyspnea on exertion, cough, sputum, wheezing.  GI: See history of present illness. GU:  Negative for dysuria, hematuria, urinary incontinence, urinary frequency, nocturnal urination.  MS: Negative for joint pain, low back pain.  Derm: Negative for rash or itching.  Neuro: Negative for weakness, abnormal sensation, seizure, frequent headaches, memory loss, confusion.  Psych: Negative for anxiety, depression, suicidal ideation, hallucinations.  Endo: Negative for unusual weight change.  Heme: Negative for bruising or bleeding. Allergy: Negative for rash or hives.      Physical Examination:   Vital signs in last 24 hours: Temp:  [97.5 F (36.4 C)-98.1 F (36.7 C)] 98.1 F (36.7 C) (09/08 0552) Pulse Rate:  [80-96] 95 (09/08 0552) Resp:  [10-18] 16 (09/08 0552) BP: (94-117)/(61-71) 117/70 (09/08 0552) SpO2:  [100 %] 100 % (09/08 0552) Weight:  [100 kg-109.1 kg] 100 kg (09/07 2036) Last BM Date : 08/16/22  General: chronically ill appearing. Lethargic. Arouses with verbal stimuli but stays away only briefly. Reportedly was awake and ate breakfast earlier this morning.  Head: Normocephalic, atraumatic.   Eyes: Conjunctiva pink, +mild icterus. Mouth: Oropharyngeal mucosa moist and pink , no lesions erythema or exudate. Neck: Supple without thyromegaly, masses, or  lymphadenopathy.  Lungs: Clear to auscultation bilaterally.   Heart: Regular rate and rhythm, no murmurs rubs or gallops.  Abdomen: Bowel sounds are normal, distended but soft, nontender, no abdominal bruits or hernia , no rebound or guarding.   Rectal: not performed Extremities: No lower extremity edema, clubbing, deformity.  Neuro: Lethargic. Awakes. oriented x 4 , grossly normal neurologically.  Skin: Warm and dry, no rash. Tanned.   Psych: Lethargic.         Intake/Output from previous day: No intake/output data recorded. Intake/Output this shift: No intake/output data recorded.  Lab Results:   CBC Recent Labs    08/16/22 1540 08/17/22 0516  WBC 5.1 4.5  HGB 11.4* 11.2*  HCT 33.9* 32.2*  MCV 109.7* 107.3*  PLT 79* 77*   BMET Recent Labs    08/16/22 1540 08/17/22 0516  NA 133* 136  K 3.8 3.9  CL 111 115*  CO2 15* 13*  GLUCOSE 200* 123*  BUN 59* 63*  CREATININE 2.80* 2.62*  CALCIUM 9.6 9.7   LFT Recent Labs    08/16/22 1540  BILITOT 2.2*  ALKPHOS 151*  AST 43*  ALT 30  PROT 7.3  ALBUMIN 3.9    Lipase No results for input(s): "LIPASE" in the last 72 hours.  PT/INR Recent Labs    08/16/22 1540  LABPROT 16.9*  INR 1.4*     Hepatitis Panel No results for input(s): "HEPBSAG", "HCVAB", "HEPAIGM", "HEPBIGM" in the last 72 hours.   Imaging Studies:   CT Head Wo Contrast  Result Date: 08/16/2022 CLINICAL DATA:  Altered mental status EXAM: CT HEAD WITHOUT CONTRAST TECHNIQUE: Contiguous axial images were obtained from the base of the skull through the vertex without intravenous contrast. RADIATION DOSE REDUCTION: This exam was performed according to the departmental dose-optimization program which includes automated exposure control, adjustment of the mA and/or kV according to patient size and/or use of iterative reconstruction technique. COMPARISON:  Brain CT 01/15/2014 FINDINGS: Brain: No evidence of acute infarction, hemorrhage, hydrocephalus, extra-axial collection or mass lesion/mass effect. Vascular: No  hyperdense vessel or unexpected calcification. Skull: Normal. Negative for fracture or focal lesion. Sinuses/Orbits: Mild paranasal sinus mucosal thickening. No air-fluid levels. Other: None. IMPRESSION: No acute intracranial process Electronically Signed   By: Lovey Newcomer M.D.   On: 08/16/2022 17:15   DG Chest Port 1 View  Result Date: 08/16/2022 CLINICAL DATA:  Altered mental status.  Prior thoracentesis EXAM: PORTABLE CHEST 1 VIEW COMPARISON:  chest radiograph November 16, 2020 FINDINGS: Monitoring leads overlie the patient. Stable cardiac and mediastinal contours. No consolidative pulmonary opacities. No pleural effusion or pneumothorax. Thoracic spine degenerative changes. IMPRESSION: No acute cardiopulmonary process. Electronically Signed   By: Lovey Newcomer M.D.   On: 08/16/2022 15:18   US Paracentesis  Result Date: 08/16/2022 INDICATION: Recurrent ascites; Cirrhosis EXAM: ULTRASOUND GUIDED LLQ PARACENTESIS MEDICATIONS: 10 cc 1% lidocaine COMPLICATIONS: None immediate. PROCEDURE: Informed written consent was obtained from the patient after a discussion of the risks, benefits and alternatives to treatment. A timeout was performed prior to the initiation of the procedure. Initial ultrasound scanning demonstrates a large amount of ascites within the left lower abdominal quadrant. The left lower abdomen was prepped and draped in the usual sterile fashion. 1% lidocaine was used for local anesthesia. Following this, a Yueh catheter was introduced. An ultrasound image was saved for documentation purposes. The paracentesis was performed. The catheter was removed and a dressing was applied. The patient tolerated the procedure well without immediate post procedural complication.  Patient received post-procedure intravenous albumin; see nursing notes for details. FINDINGS: A total of approximately 6 liters of cloudy yellow fluid was removed. Samples were sent to the laboratory as requested by the clinical team.  IMPRESSION: Successful ultrasound-guided paracentesis yielding 6 liters of peritoneal fluid. Read by Lavonia Drafts [facility] Electronically Signed   By: Lavonia Dana M.D.   On: 08/16/2022 10:46   US Paracentesis  Result Date: 08/09/2022 INDICATION: Recurrent ascites; Cirrhosis Known to [facility] Radiology IR. BRTO 11/2019; TIPS 11/24/2019; Coiling for recanalization of gastric varices 11/2020. Per GI MD notes pt has been in [place] with documentation of narrowed/occluded TIPS (new TIPS 01/2022 in [place] per notes) and has had several large volume paracentesis over last 2 years. Last para 08/01/22 per chart EXAM: ULTRASOUND GUIDED LLQ PARACENTESIS MEDICATIONS: 10 cc 1% lidocaine COMPLICATIONS: None immediate. PROCEDURE: Informed written consent was obtained from the patient after a discussion of the risks, benefits and alternatives to treatment. A timeout was performed prior to the initiation of the procedure. Initial ultrasound scanning demonstrates a large amount of ascites within the left lower abdominal quadrant. The left lower abdomen was prepped and draped in the usual sterile fashion. 1% lidocaine was used for local anesthesia. Following this, a Yueh catheter was introduced. An ultrasound image was saved for documentation purposes. The paracentesis was performed. The catheter was removed and a dressing was applied. The patient tolerated the procedure well without immediate post procedural complication. Patient received post-procedure intravenous albumin; see nursing notes for details. FINDINGS: A total of approximately 8 liter limit of cloudy pale yellow fluid was removed. Samples were sent to the laboratory as requested by the clinical team. IMPRESSION: Successful ultrasound-guided paracentesis yielding 8 liters maximum per ordering MD of peritoneal fluid. PLAN: The patient has required >/=2 paracenteses in a 30 day period and a formal evaluation by the [facility] has been arranged. Read by Lavonia Drafts [facility] Electronically Signed   By: Ruthann Cancer M.D.   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 He had underwent LVAP of 6 liters of fluid and after returning home and taking a nap, he woke up lethargic/altered. GI consulted for recurrent hepatic encephalopathy.  Hepatic encephalopathy: patient's wife reported compliance with lactulose and Xifaxan. Takes enough lactulose to have 2-3 stools daily but requiring more lactulose. He has had some intermittent vomiting. Reported this after recent office visit, but thought it was due to drinking lactulose too fast. No recent GI bleeding, fever. Symptoms likely exacerbated by large volume paracentesis +/- vomiting. He was more alert this morning. Ate breakfast and turned in the bed. He was able to take his medications. When I evaluated him, he was lethargic again. He was oriented but would quickly fall asleep and not follow commands.   Recurrent ascites: history of thrombosed TIPS. Requiring frequent paracentesis but this has been associated with decline in creatinine after taps. He has been averaging 8 liters each time, approximately weekly. Has been receiving IV albumin. Would consider reducing volume draw off each time, continue IV albumin. His creatinine was 1.8 on 08/03/22 but up to 2.35 on 8/29. Advised to hold Bumex for one week. Tapped 8/31 and 9/7 with a  combined 14 liters drawn off and creatinine 2.8 on admission. He does not have lower extremity edema.  Acute kidney injury: creatinine elevations as outlined. Suspected combination of diuretic therapy and LVAP. He has no lower  extremity edema.   MELD Na 26 yesterday  Plan:   Consider holding diruretics. Consider nephrology evaluation, inpatient or outpatient.  Referral to [facility] liver transplant center pending. Continue Xifaxan 575m BID. Continue lactulose to titrate for 2-3 soft stools daily. Recommend restricting the amount of fluid drawn off at time of paracentesis, to 4 liter and provide with 25 grams of 25% albumin.  2 grams sodium diet. Recheck labs for MELD in [place].     LOS: 0 days   We would like to thank you for the opportunity to participate in the care of TTERRYN REDNER  LLaureen Ochs LBernarda CaffeyRCentura Health-[facility] [phone] [place]

## 2022-08-18 DIAGNOSIS — K7682 Hepatic encephalopathy: Secondary | ICD-10-CM | POA: Diagnosis not present

## 2022-08-18 LAB — PROTIME-INR
INR: 1.4 — ABNORMAL HIGH (ref 0.8–1.2)
Prothrombin Time: 17.2 seconds — ABNORMAL HIGH (ref 11.4–15.2)

## 2022-08-18 LAB — CBC
HCT: 27.1 % — ABNORMAL LOW (ref 39.0–52.0)
Hemoglobin: 9.5 g/dL — ABNORMAL LOW (ref 13.0–17.0)
MCH: 36.7 pg — ABNORMAL HIGH (ref 26.0–34.0)
MCHC: 35.1 g/dL (ref 30.0–36.0)
MCV: 104.6 fL — ABNORMAL HIGH (ref 80.0–100.0)
Platelets: 79 10*3/uL — ABNORMAL LOW (ref 150–400)
RBC: 2.59 MIL/uL — ABNORMAL LOW (ref 4.22–5.81)
RDW: 14.1 % (ref 11.5–15.5)
WBC: 4.9 10*3/uL (ref 4.0–10.5)
nRBC: 0 % (ref 0.0–0.2)

## 2022-08-18 LAB — COMPREHENSIVE METABOLIC PANEL
ALT: 22 U/L (ref 0–44)
AST: 34 U/L (ref 15–41)
Albumin: 4.1 g/dL (ref 3.5–5.0)
Alkaline Phosphatase: 119 U/L (ref 38–126)
Anion gap: 6 (ref 5–15)
BUN: 63 mg/dL — ABNORMAL HIGH (ref 6–20)
CO2: 14 mmol/L — ABNORMAL LOW (ref 22–32)
Calcium: 9.7 mg/dL (ref 8.9–10.3)
Chloride: 112 mmol/L — ABNORMAL HIGH (ref 98–111)
Creatinine, Ser: 2.57 mg/dL — ABNORMAL HIGH (ref 0.61–1.24)
GFR, Estimated: 29 mL/min — ABNORMAL LOW (ref >60)
Glucose, Bld: 146 mg/dL — ABNORMAL HIGH (ref 70–99)
Potassium: 3.8 mmol/L (ref 3.5–5.1)
Sodium: 132 mmol/L — ABNORMAL LOW (ref 135–145)
Total Bilirubin: 1.9 mg/dL — ABNORMAL HIGH (ref 0.3–1.2)
Total Protein: 6.6 g/dL (ref 6.5–8.1)

## 2022-08-18 LAB — AMMONIA: Ammonia: 300 umol/L — ABNORMAL HIGH (ref 9–35)

## 2022-08-18 LAB — HEMOGLOBIN AND HEMATOCRIT, BLOOD
HCT: 27.5 % — ABNORMAL LOW (ref 39.0–52.0)
Hemoglobin: 9.8 g/dL — ABNORMAL LOW (ref 13.0–17.0)

## 2022-08-18 LAB — VITAMIN B12: Vitamin B-12: 555 pg/mL (ref 180–914)

## 2022-08-18 LAB — MAGNESIUM: Magnesium: 2.6 mg/dL — ABNORMAL HIGH (ref 1.7–2.4)

## 2022-08-18 LAB — FOLATE: Folate: 28.9 ng/mL (ref >5.9)

## 2022-08-18 MED ORDER — LACTULOSE 10 GM/15ML PO SOLN
30.0000 g | Freq: Four times a day (QID) | ORAL | Status: DC
  Administered 2022-08-18 – 2022-08-20 (×8): 30 g via ORAL
  Filled 2022-08-18 (×8): qty 60

## 2022-08-18 MED ORDER — ZINC SULFATE 220 (50 ZN) MG PO CAPS
220.0000 mg | ORAL_CAPSULE | Freq: Every day | ORAL | Status: DC
  Administered 2022-08-18 – 2022-08-20 (×3): 220 mg via ORAL
  Filled 2022-08-18 (×3): qty 1

## 2022-08-18 MED ORDER — LACTULOSE ENEMA
300.0000 mL | Freq: Four times a day (QID) | ORAL | Status: DC
  Administered 2022-08-18 – 2022-08-20 (×8): 300 mL via RECTAL
  Filled 2022-08-18 (×18): qty 300

## 2022-08-18 NOTE — Progress Notes (Signed)
PROGRESS NOTE    Bradley Miles  DXI:[id number] DOB: 21-Mar-1966 DOA: 08/16/2022 PCP: [name], MD   Brief Narrative:  Bradley Miles is a 56 y.o. male with medical history significant for alcoholic liver cirrhosis, diabetes mellitus, hypertension, varices, COPD. Patient was brought to the ED reports of altered mental status.  Patient had paracentesis today, and on getting home, he took a nap and subsequently woke up confused and somnolent.  Patient was admitted with acute hepatic encephalopathy as well as AKI likely hepatorenal and in the setting of large volume paracentesis.  He has been given some albumin infusions on 9/8.  Worsening anemia noted this a.m. with elevated ammonia levels and ongoing encephalopathic state.  Assessment & Plan:   Principal Problem:   Hepatic encephalopathy (HCC) Active Problems:   Hypertension   Alcoholic cirrhosis of liver with ascites (HCC)   Portal hypertension (HCC)   Thrombocytopenia (HCC)   Diabetes ([place])   Gastric varices   Acute kidney injury ([place])  Assessment and Plan:  Hepatic encephalopathy (HCC) Lethargy, on lactulose and rifaximin.  Spouse reports compliance.  Head CT unremarkable.  Chest x-ray clear.  Paracentesis today, fluid analysis not suggestive of infectious at this time. -Lactulose enema given in ED, resume home lactulose -Resume rifaximin -Follow-up culture results from ascitic fluid analysis today -Ammonia levels increased -Appreciate GI evaluation and set up for follow-up at liver transplant center at [facility]   AKI (acute kidney injury) ([place]) Baseline appears to be 0.6-0.8.  He is on diuretics.  And frequently gets large-volume paracentesis.  Question hepatorenal syndrome, versus prerenal from diuretics. -1 L bolus given in ED- -hold spironolactone, Bumex for now -Creatinine currently downtrending, monitor strict I's and O's -Given albumin 9/8 -Nonoliguric with urine output 1100 measured  9/8  Anemia-downtrending -Recheck H&H     Diabetes ([place]) Blood glucose 200.  A1c 5.6. - SSi- s   Thrombocytopenia (HCC) -Due to liver disease -SCDs   Hypertension Blood pressure soft. -Hold spironolactone and Bumex for now.     DVT prophylaxis:SCDs Code Status: Full Family Communication:Wife and sister at bedside 9/9  Disposition Plan:  Status is: Inpatient Remains inpatient appropriate because: IV medications/close monitoring   Consultants:  GI   Procedures:  None   Antimicrobials:  None    Subjective: Patient seen and evaluated today with ongoing lethargic state and confusion.  Wife at bedside.  Rectal tube present with ongoing liquidy stool output.  Objective: Vitals:   08/17/22 0552 08/17/22 1416 08/17/22 2149 08/18/22 0437  BP: 117/70 116/65 103/64 102/62  Pulse: 95 (!) 105 (!) 50 100  Resp: 16 20 18 19   Temp: 98.1 F (36.7 C) 98.2 F (36.8 C) 97.6 F (36.4 C) 98.2 F (36.8 C)  TempSrc: Oral Oral Oral Oral  SpO2: 100% 100% 91% 98%  Weight:      Height:        Intake/Output Summary (Last 24 hours) at 08/18/2022 1005 Last data filed at 08/18/2022 0900 Gross per 24 hour  Intake 1123.17 ml  Output 1100 ml  Net 23.17 ml   Filed Weights   08/16/22 1416 08/16/22 2036  Weight: 109.1 kg 100 kg    Examination:  General exam: Appears somnolent/lethargic Respiratory system: Clear to auscultation. Respiratory effort normal. Cardiovascular system: S1 & S2 heard, RRR.  Gastrointestinal system: Abdomen is soft Central nervous system: Lethargic Extremities: No edema Skin: No significant lesions noted Psychiatry: Flat affect. Rectal tube with ongoing liquidy stool output    Data Reviewed: I  have personally reviewed following labs and imaging studies  CBC: Recent Labs  Lab 08/16/22 1540 08/17/22 0516 08/18/22 0543  WBC 5.1 4.5 4.9  NEUTROABS 4.0  --   --   HGB 11.4* 11.2* 9.5*  HCT 33.9* 32.2* 27.1*  MCV 109.7* 107.3* 104.6*  PLT 79* 77*  79*   Basic Metabolic Panel: Recent Labs  Lab 08/16/22 1540 08/17/22 0516 08/18/22 0543  NA 133* 136 132*  K 3.8 3.9 3.8  CL 111 115* 112*  CO2 15* 13* 14*  GLUCOSE 200* 123* 146*  BUN 59* 63* 63*  CREATININE 2.80* 2.62* 2.57*  CALCIUM 9.6 9.7 9.7  MG  --   --  2.6*   GFR: Estimated Creatinine Clearance: 40.7 mL/min (A) (by C-G formula based on SCr of 2.57 mg/dL (H)). Liver Function Tests: Recent Labs  Lab 08/16/22 1540 08/18/22 0543  AST 43* 34  ALT 30 22  ALKPHOS 151* 119  BILITOT 2.2* 1.9*  PROT 7.3 6.6  ALBUMIN 3.9 4.1   No results for input(s): "LIPASE", "AMYLASE" in the last 168 hours. Recent Labs  Lab 08/16/22 1540 08/17/22 0957 08/18/22 0543  AMMONIA 162* 94* 300*   Coagulation Profile: Recent Labs  Lab 08/16/22 1540 08/18/22 0543  INR 1.4* 1.4*   Cardiac Enzymes: No results for input(s): "CKTOTAL", "CKMB", "CKMBINDEX", "TROPONINI" in the last 168 hours. BNP (last 3 results) No results for input(s): "PROBNP" in the last 8760 hours. HbA1C: No results for input(s): "HGBA1C" in the last 72 hours. CBG: No results for input(s): "GLUCAP" in the last 168 hours. Lipid Profile: No results for input(s): "CHOL", "HDL", "LDLCALC", "TRIG", "CHOLHDL", "LDLDIRECT" in the last 72 hours. Thyroid Function Tests: No results for input(s): "TSH", "T4TOTAL", "FREET4", "T3FREE", "THYROIDAB" in the last 72 hours. Anemia Panel: No results for input(s): "VITAMINB12", "FOLATE", "FERRITIN", "TIBC", "IRON", "RETICCTPCT" in the last 72 hours. Sepsis Labs: No results for input(s): "PROCALCITON", "LATICACIDVEN" in the last 168 hours.  Recent Results (from the past 240 hour(s))  Culture, body fluid w Gram Stain-bottle     Status: None   Collection Time: 08/09/22  8:14 AM   Specimen: Ascitic  Result Value Ref Range Status   Specimen Description ASCITIC  Final   Special Requests BOTTLES DRAWN AEROBIC AND ANAEROBIC 10CC  Final   Culture   Final    NO GROWTH 5  DAYS Performed at [facility], [address] [place], [place] [address]    Report Status 08/14/2022 FINAL  Final  Gram stain     Status: None   Collection Time: 08/09/22  8:14 AM   Specimen: Ascitic  Result Value Ref Range Status   Specimen Description ASCITIC  Final   Special Requests NONE  Final   Gram Stain   Final    NO ORGANISMS SEEN WBC PRESENT, PREDOMINANTLY MONONUCLEAR CYTOSPIN SMEAR Performed at [facility], [address], [place], [place] [address]    Report Status 08/09/2022 FINAL  Final  Culture, body fluid w Gram Stain-bottle     Status: None (Preliminary result)   Collection Time: 08/16/22  9:25 AM   Specimen: Peritoneal Washings  Result Value Ref Range Status   Specimen Description PERITONEAL  Final   Special Requests 100C  Final   Culture   Final    NO GROWTH 2 DAYS Performed at [facility], [address] [place], [place] [address]    Report Status PENDING  Incomplete  Gram stain     Status: None   Collection Time: 08/16/22  9:37 AM   Specimen: Peritoneal Washings  Result Value Ref Range Status   Specimen Description PERITONEAL  Final   Special Requests NONE  Final   Gram Stain   Final    WBC PRESENT, PREDOMINANTLY MONONUCLEAR NO ORGANISMS SEEN CYTOSPIN SMEAR Performed at [facility], [address] [place],  [address]    Report Status 08/16/2022 FINAL  Final         Radiology Studies: CT Head Wo Contrast  Result Date: 08/16/2022 CLINICAL DATA:  Altered mental status EXAM: CT HEAD WITHOUT CONTRAST TECHNIQUE: Contiguous axial images were obtained from the base of the skull through the vertex without intravenous contrast. RADIATION DOSE REDUCTION: This exam was performed according to the departmental dose-optimization program which includes automated exposure control, adjustment of the mA and/or kV according to patient size and/or use of iterative reconstruction technique. COMPARISON:  Brain CT 01/15/2014 FINDINGS: Brain: No evidence of  acute infarction, hemorrhage, hydrocephalus, extra-axial collection or mass lesion/mass effect. Vascular: No hyperdense vessel or unexpected calcification. Skull: Normal. Negative for fracture or focal lesion. Sinuses/Orbits: Mild paranasal sinus mucosal thickening. No air-fluid levels. Other: None. IMPRESSION: No acute intracranial process Electronically Signed   By: Lovey Newcomer M.D.   On: 08/16/2022 17:15   DG Chest Port 1 View  Result Date: 08/16/2022 CLINICAL DATA:  Altered mental status.  Prior thoracentesis EXAM: PORTABLE CHEST 1 VIEW COMPARISON:  chest radiograph November 16, 2020 FINDINGS: Monitoring leads overlie the patient. Stable cardiac and mediastinal contours. No consolidative pulmonary opacities. No pleural effusion or pneumothorax. Thoracic spine degenerative changes. IMPRESSION: No acute cardiopulmonary process. Electronically Signed   By: Lovey Newcomer M.D.   On: 08/16/2022 15:18   US Paracentesis  Result Date: 08/16/2022 INDICATION: Recurrent ascites; Cirrhosis EXAM: ULTRASOUND GUIDED LLQ PARACENTESIS MEDICATIONS: 10 cc 1% lidocaine COMPLICATIONS: None immediate. PROCEDURE: Informed written consent was obtained from the patient after a discussion of the risks, benefits and alternatives to treatment. A timeout was performed prior to the initiation of the procedure. Initial ultrasound scanning demonstrates a large amount of ascites within the left lower abdominal quadrant. The left lower abdomen was prepped and draped in the usual sterile fashion. 1% lidocaine was used for local anesthesia. Following this, a Yueh catheter was introduced. An ultrasound image was saved for documentation purposes. The paracentesis was performed. The catheter was removed and a dressing was applied. The patient tolerated the procedure well without immediate post procedural complication. Patient received post-procedure intravenous albumin; see nursing notes for details. FINDINGS: A total of approximately 6 liters of  cloudy yellow fluid was removed. Samples were sent to the laboratory as requested by the clinical team. IMPRESSION: Successful ultrasound-guided paracentesis yielding 6 liters of peritoneal fluid. Read by Lavonia Drafts [facility] Electronically Signed   By: Lavonia Dana M.D.   On: 08/16/2022 10:46        Scheduled Meds:  lactulose  30 g Oral TID   pantoprazole  40 mg Oral Daily   PARoxetine  40 mg Oral Daily   rifaximin  550 mg Oral BID   sucralfate  1 g Oral TID WC & HS   thiamine  100 mg Oral Daily   zinc sulfate  220 mg Oral Daily   Continuous Infusions:  albumin human 60 mL/hr at 08/18/22 0357     LOS: 1 day    Time spent: 35 minutes    Olivia Pavelko Darleen Crocker, DO [facility]  If 7PM-7AM, please contact night-coverage [email or web address] 08/18/2022, 10:05 AM

## 2022-08-18 NOTE — Progress Notes (Signed)
Bradley Miles, M.D. Gastroenterology & Hepatology   Interval History:  Patient became more obtunded overnight. Sister is at the bedside and states she noticed her family member is more obtunded and does not follow commands.  Barely opens his eyes and talks brief Sentences. No melena, hematochezia reported. Notably, his ammonia today in the morning was 300.  Creatinine went down to 2.57.  Also, his hemoglobin dropped to 9.5 with MCV of 104.  Inpatient Medications:  Current Facility-Administered Medications:    acetaminophen (TYLENOL) tablet 650 mg, 650 mg, Oral, Q6H PRN, 650 mg at 08/17/22 2057 **OR** acetaminophen (TYLENOL) suppository 650 mg, 650 mg, Rectal, Q6H PRN, Emokpae, Ejiroghene E, MD   lactulose (CHRONULAC) 10 GM/15ML solution 30 g, 30 g, Oral, TID, Manuella Ghazi, Pratik D, DO, 30 g at 08/18/22 1002   ondansetron (ZOFRAN) tablet 4 mg, 4 mg, Oral, Q6H PRN **OR** ondansetron (ZOFRAN) injection 4 mg, 4 mg, Intravenous, Q6H PRN, Emokpae, Ejiroghene E, MD   Oral care mouth rinse, 15 mL, Mouth Rinse, PRN, Manuella Ghazi, Pratik D, DO   pantoprazole (PROTONIX) EC tablet 40 mg, 40 mg, Oral, Daily, Emokpae, Ejiroghene E, MD, 40 mg at 08/18/22 1000   PARoxetine (PAXIL) tablet 40 mg, 40 mg, Oral, Daily, Manuella Ghazi, Pratik D, DO, 40 mg at 08/18/22 1000   rifaximin (XIFAXAN) tablet 550 mg, 550 mg, Oral, BID, Emokpae, Ejiroghene E, MD, 550 mg at 08/18/22 1000   sucralfate (CARAFATE) tablet 1 g, 1 g, Oral, TID WC & HS, Shah, Pratik D, DO, 1 g at 08/18/22 5956   thiamine (VITAMIN B1) tablet 100 mg, 100 mg, Oral, Daily, Manuella Ghazi, Pratik D, DO, 100 mg at 08/18/22 1001   zinc sulfate capsule 220 mg, 220 mg, Oral, Daily, Manuella Ghazi, Pratik D, DO, 220 mg at 08/18/22 1004   I/O    Intake/Output Summary (Last 24 hours) at 08/18/2022 1026 Last data filed at 08/18/2022 0900 Gross per 24 hour  Intake 1123.17 ml  Output 1100 ml  Net 23.17 ml     Physical Exam: Temp:  [97.6 F (36.4 C)-98.2 F (36.8 C)] 98.2 F (36.8 C) (09/09  0437) Pulse Rate:  [50-105] 100 (09/09 0437) Resp:  [18-20] 19 (09/09 0437) BP: (102-116)/(62-65) 102/62 (09/09 0437) SpO2:  [91 %-100 %] 98 % (09/09 0437)  Temp (24hrs), Avg:98 F (36.7 C), Min:97.6 F (36.4 C), Max:98.2 F (36.8 C) GENERAL: The patient is AO x2 but very somnolent, in no acute distress. HEENT: Head is normocephalic and atraumatic. EOMI are intact. Mouth is well hydrated and without lesions. NECK: Supple. No masses LUNGS: Clear to auscultation. No presence of rhonchi/wheezing/rales. Adequate chest expansion HEART: RRR, normal s1 and s2. ABDOMEN: Soft, nontender, no guarding, no peritoneal signs, and nondistended. BS +. No masses. EXTREMITIES: Without any cyanosis, clubbing, rash, lesions or edema. NEUROLOGIC: AOx2, no focal motor deficit. SKIN: no jaundice, upper extremity bruising  Laboratory Data: CBC:     Component Value Date/Time   WBC 4.9 08/18/2022 0543   RBC 2.59 (L) 08/18/2022 0543   HGB 9.5 (L) 08/18/2022 0543   HGB 9.5 (L) 11/21/2020 1457   HCT 27.1 (L) 08/18/2022 0543   HCT 28.2 (L) 11/21/2020 1457   PLT 79 (L) 08/18/2022 0543   PLT 79 (LL) 11/21/2020 1457   MCV 104.6 (H) 08/18/2022 0543   MCV 102 (H) 11/21/2020 1457   MCH 36.7 (H) 08/18/2022 0543   MCHC 35.1 08/18/2022 0543   RDW 14.1 08/18/2022 0543   RDW 15.6 (H) 11/21/2020 1457   LYMPHSABS 0.3 (L)  08/16/2022 1540   LYMPHSABS 0.7 06/20/2020 0855   MONOABS 0.6 08/16/2022 1540   EOSABS 0.1 08/16/2022 1540   EOSABS 0.2 06/20/2020 0855   BASOSABS 0.0 08/16/2022 1540   BASOSABS 0.1 06/20/2020 0855   COAG:  Lab Results  Component Value Date   INR 1.4 (H) 08/18/2022   INR 1.4 (H) 08/16/2022   INR 1.3 (H) 10/07/2021    BMP:     Latest Ref Rng & Units 08/18/2022    5:43 AM 08/17/2022    5:16 AM 08/16/2022    3:40 PM  BMP  Glucose 70 - 99 mg/dL 146  123  200   BUN 6 - 20 mg/dL 63  63  59   Creatinine 0.61 - 1.24 mg/dL 2.57  2.62  2.80   Sodium 135 - 145 mmol/L 132  136  133   Potassium 3.5  - 5.1 mmol/L 3.8  3.9  3.8   Chloride 98 - 111 mmol/L 112  115  111   CO2 22 - 32 mmol/L 14  13  15    Calcium 8.9 - 10.3 mg/dL 9.7  9.7  9.6     HEPATIC:     Latest Ref Rng & Units 08/18/2022    5:43 AM 08/16/2022    3:40 PM 10/07/2021    4:23 PM  Hepatic Function  Total Protein 6.5 - 8.1 g/dL 6.6  7.3  8.3   Albumin 3.5 - 5.0 g/dL 4.1  3.9  3.5   AST 15 - 41 U/L 34  43  105   ALT 0 - 44 U/L 22  30  49   Alk Phosphatase 38 - 126 U/L 119  151  124   Total Bilirubin 0.3 - 1.2 mg/dL 1.9  2.2  1.9     CARDIAC:  Lab Results  Component Value Date   CKTOTAL 42 (L) 06/04/2018      Imaging: I personally reviewed and interpreted the available labs, imaging and endoscopic files.   Assessment/Plan: 56 year old male with a complex past medical history of alcoholic cirrhosis complicated by recurrent ascites, hepatic cephalopathy, poor hypertensive gastropathy, gastric variceal bleeding requiring TIPS placement (unfortunately this failed and had occlusion), diabetes, hypertension, COPD, who was brought to the hospital after presenting worsening mental status.  The patient has presented significant fluctuation in his mental status despite being on rifaximin and lactulose orally.  Today his mental status is worse.  It is unclear if he has presented regular bowel movements or not as he had a rectal tube.  Due to this, we will continue him on rifaximin 550 mg twice a day and the lactulose will be increased to every 6 hours orally but he will also be started on lactulose enemas for a goal of 3-4 bowel movements per day.  I will also start him on zinc oxide 220 mg daily.  It is unclear why he has presented persistent encephalopathy.  So far he has not presented any SIRS criteria that will make [place] concern of ongoing infection but this could be decreased in patient with cirrhosis.  We will proceed with an EGD tomorrow to evaluate if there has been any ongoing bleeding causing a drop in his hemoglobin and  persistence of his encephalopathy.  However, if this is negative, we will need to panculture and start broad-spectrum antibiotics.  We will also recommend checking a folic acid level and vitamin B12 as he has macrocytic anemia.  Will need to do daily MELD labs. MELD today was 26.  I had a lengthy discussion with the wife and the sister regarding the patient's current medical condition. Explained to them that given his current liver disease, he will be better served at a tertiary center for evaluation of liver transplant.  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  I explained to the wife that we could refer them to [facility] for further evaluation and transplant center but the wife would like to "sort out with [facility] directly first before committing to having the patient's care at [facility]".  If this does not work out, then we will refer him for evaluation at [facility].  Bradley Peppers, MD Gastroenterology and Hepatology [facility]

## 2022-08-18 NOTE — Anesthesia Preprocedure Evaluation (Signed)
Anesthesia Evaluation  Patient identified by MRN, date of birth, ID band Patient awake    Reviewed: Allergy & Precautions, NPO status , Patient's Chart, lab work & pertinent test results  Airway Mallampati: II  TM Distance: >3 FB Neck ROM: Full    Dental  (+) Dental Advisory Given, Missing, Poor Dentition, Chipped   Pulmonary shortness of breath and with exertion, COPD, Current Smoker and Patient abstained from smoking.,    Pulmonary exam normal breath sounds clear to auscultation       Cardiovascular Exercise Tolerance: Poor hypertension, Pt. on medications Normal cardiovascular exam Rhythm:Regular Rate:Normal     Neuro/Psych  Headaches, PSYCHIATRIC DISORDERS Anxiety Depression  Neuromuscular disease    GI/Hepatic GERD  Medicated,(+) Cirrhosis   Esophageal Varices and ascites  substance abuse  alcohol use, Hepatitis -, Toxin Related  Endo/Other  diabetes, Well Controlled, Type 2, Insulin Dependent, Oral Hypoglycemic Agents  Renal/GU Renal InsufficiencyRenal disease (AKI on CRI)  negative genitourinary   Musculoskeletal negative musculoskeletal ROS (+)   Abdominal   Peds negative pediatric ROS (+)  Hematology  (+) Blood dyscrasia, anemia ,   Anesthesia Other Findings   Reproductive/Obstetrics negative OB ROS                            Anesthesia Physical Anesthesia Plan  ASA: 4  Anesthesia Plan: General   Post-op Pain Management: Minimal or no pain anticipated   Induction: Intravenous  PONV Risk Score and Plan: Propofol infusion  Airway Management Planned: Nasal Cannula and Natural Airway  Additional Equipment:   Intra-op Plan:   Post-operative Plan: Possible Post-op intubation/ventilation  Informed Consent: I have reviewed the patients History and Physical, chart, labs and discussed the procedure including the risks, benefits and alternatives for the proposed anesthesia  with the patient or authorized representative who has indicated his/her understanding and acceptance.     Dental advisory given  Plan Discussed with: Surgeon  Anesthesia Plan Comments:        Anesthesia Quick Evaluation

## 2022-08-19 ENCOUNTER — Encounter (HOSPITAL_COMMUNITY): Payer: Self-pay | Admitting: Internal Medicine

## 2022-08-19 ENCOUNTER — Encounter (HOSPITAL_COMMUNITY)
Admission: EM | Disposition: A | Discharge status: Home / Self Care | Payer: Self-pay | Source: Home / Self Care | Attending: Internal Medicine

## 2022-08-19 ENCOUNTER — Inpatient Hospital Stay (HOSPITAL_COMMUNITY): Payer: Managed Care, Other (non HMO) | Admitting: Anesthesiology

## 2022-08-19 DIAGNOSIS — K7682 Hepatic encephalopathy: Secondary | ICD-10-CM | POA: Diagnosis not present

## 2022-08-19 HISTORY — PX: ESOPHAGOGASTRODUODENOSCOPY (EGD) WITH PROPOFOL: SHX5813

## 2022-08-19 LAB — AMMONIA: Ammonia: 84 umol/L — ABNORMAL HIGH (ref 9–35)

## 2022-08-19 LAB — CBC
HCT: 29 % — ABNORMAL LOW (ref 39.0–52.0)
Hemoglobin: 10.1 g/dL — ABNORMAL LOW (ref 13.0–17.0)
MCH: 36.9 pg — ABNORMAL HIGH (ref 26.0–34.0)
MCHC: 34.8 g/dL (ref 30.0–36.0)
MCV: 105.8 fL — ABNORMAL HIGH (ref 80.0–100.0)
Platelets: 73 10*3/uL — ABNORMAL LOW (ref 150–400)
RBC: 2.74 MIL/uL — ABNORMAL LOW (ref 4.22–5.81)
RDW: 14 % (ref 11.5–15.5)
WBC: 4.5 10*3/uL (ref 4.0–10.5)
nRBC: 0 % (ref 0.0–0.2)

## 2022-08-19 LAB — COMPREHENSIVE METABOLIC PANEL
ALT: 22 U/L (ref 0–44)
AST: 34 U/L (ref 15–41)
Albumin: 4.3 g/dL (ref 3.5–5.0)
Alkaline Phosphatase: 121 U/L (ref 38–126)
Anion gap: 4 — ABNORMAL LOW (ref 5–15)
BUN: 62 mg/dL — ABNORMAL HIGH (ref 6–20)
CO2: 16 mmol/L — ABNORMAL LOW (ref 22–32)
Calcium: 9.9 mg/dL (ref 8.9–10.3)
Chloride: 112 mmol/L — ABNORMAL HIGH (ref 98–111)
Creatinine, Ser: 2.03 mg/dL — ABNORMAL HIGH (ref 0.61–1.24)
GFR, Estimated: 38 mL/min — ABNORMAL LOW (ref >60)
Glucose, Bld: 116 mg/dL — ABNORMAL HIGH (ref 70–99)
Potassium: 3.7 mmol/L (ref 3.5–5.1)
Sodium: 132 mmol/L — ABNORMAL LOW (ref 135–145)
Total Bilirubin: 4.1 mg/dL — ABNORMAL HIGH (ref 0.3–1.2)
Total Protein: 6.8 g/dL (ref 6.5–8.1)

## 2022-08-19 LAB — PROTIME-INR
INR: 1.5 — ABNORMAL HIGH (ref 0.8–1.2)
Prothrombin Time: 17.6 seconds — ABNORMAL HIGH (ref 11.4–15.2)

## 2022-08-19 LAB — MAGNESIUM: Magnesium: 2.3 mg/dL (ref 1.7–2.4)

## 2022-08-19 SURGERY — ESOPHAGOGASTRODUODENOSCOPY (EGD) WITH PROPOFOL
Anesthesia: General

## 2022-08-19 MED ORDER — LIDOCAINE HCL (PF) 2 % IJ SOLN
INTRAMUSCULAR | Status: AC
  Filled 2022-08-19: qty 5

## 2022-08-19 MED ORDER — PROPOFOL 10 MG/ML IV BOLUS
INTRAVENOUS | Status: DC | PRN
  Administered 2022-08-19: 20 mg via INTRAVENOUS
  Administered 2022-08-19: 100 mg via INTRAVENOUS

## 2022-08-19 MED ORDER — LACTATED RINGERS IV SOLN: INTRAVENOUS | Status: DC | PRN

## 2022-08-19 MED ORDER — PROPOFOL 10 MG/ML IV BOLUS
INTRAVENOUS | Status: AC
  Filled 2022-08-19: qty 20

## 2022-08-19 MED ORDER — PHENYLEPHRINE 80 MCG/ML (10ML) SYRINGE FOR IV PUSH (FOR BLOOD PRESSURE SUPPORT)
PREFILLED_SYRINGE | INTRAVENOUS | Status: DC | PRN
  Administered 2022-08-19: 80 ug via INTRAVENOUS

## 2022-08-19 MED ORDER — FENTANYL CITRATE (PF) 100 MCG/2ML IJ SOLN
INTRAMUSCULAR | Status: DC | PRN
  Administered 2022-08-19: 50 ug via INTRAVENOUS

## 2022-08-19 MED ORDER — LIDOCAINE HCL (CARDIAC) PF 100 MG/5ML IV SOSY
PREFILLED_SYRINGE | INTRAVENOUS | Status: DC | PRN
  Administered 2022-08-19: 60 mg via INTRATRACHEAL

## 2022-08-19 MED ORDER — PROPOFOL 500 MG/50ML IV EMUL
INTRAVENOUS | Status: DC | PRN
  Administered 2022-08-19: 150 ug/kg/min via INTRAVENOUS

## 2022-08-19 MED ORDER — FENTANYL CITRATE (PF) 100 MCG/2ML IJ SOLN
INTRAMUSCULAR | Status: AC
  Filled 2022-08-19: qty 2

## 2022-08-19 NOTE — Progress Notes (Signed)
PROGRESS NOTE    Bradley Miles  LMB:[id number] DOB: Aug 14, 1966 DOA: 08/16/2022 PCP: [name], MD   Brief Narrative:  Bradley Miles is a 56 y.o. male with medical history significant for alcoholic liver cirrhosis, diabetes mellitus, hypertension, varices, COPD. Patient was brought to the ED reports of altered mental status.  Patient had paracentesis today, and on getting home, he took a nap and subsequently woke up confused and somnolent.  Patient was admitted with acute hepatic encephalopathy as well as AKI likely hepatorenal and in the setting of large volume paracentesis.  He has been given some albumin infusions on 9/8.  He has undergone EGD 9/10 with findings of varices and some recent stigmata of bleeding, but no active bleeding noted.  Blood cultures and urine analysis ordered.  Assessment & Plan:   Principal Problem:   Hepatic encephalopathy (HCC) Active Problems:   Hypertension   Alcoholic cirrhosis of liver with ascites (HCC)   Portal hypertension (HCC)   Thrombocytopenia (HCC)   Diabetes ([place])   Gastric varices   Acute kidney injury ([place])  Assessment and Plan:   Hepatic encephalopathy (HCC) Lethargy, on lactulose and rifaximin.  Spouse reports compliance.  Head CT unremarkable.  Chest x-ray clear.  Paracentesis today, fluid analysis not suggestive of infectious at this time. -Lactulose enema given in ED, resume home lactulose -Resume rifaximin -Follow-up culture results from ascitic fluid analysis today -Ammonia levels increased -Appreciate GI evaluation and set up for follow-up at liver transplant center at [facility] -EGD 9/10 with findings of varices and no active bleeding noted, continue clear liquid for now given some nausea and vomiting with Zofran ordered as needed   AKI (acute kidney injury) (HCC)-improving Baseline appears to be 0.6-0.8.  He is on diuretics.  And frequently gets large-volume paracentesis.  Question hepatorenal syndrome, versus prerenal from  diuretics. -1 L bolus given in ED- -hold spironolactone, Bumex for now -Creatinine currently downtrending, monitor strict I's and O's -Albumin given 9/8 and 9/9 -Nonoliguric with urine output 1100 measured 9/8   Anemia-stable -Recheck H&H     Diabetes (HCC) Blood glucose 200.  A1c 5.6. - SSi- s   Thrombocytopenia (HCC) -Due to liver disease -SCDs   Hypertension Blood pressure soft. -Hold spironolactone and Bumex for now.     DVT prophylaxis:SCDs Code Status: Full Family Communication:Wife and sister at bedside 9/9  Disposition Plan:  Status is: Inpatient Remains inpatient appropriate because: IV medications/close monitoring   Consultants:  GI   Procedures:  EGD 9/10   Antimicrobials:  None     Subjective: Patient seen and evaluated today after EGD and is noted to be more alert and awake compared to yesterday.  He did have an episode of vomiting after having had a smoothie soon after the procedure.  He continues to have liquidy output per rectal tube.  Objective: Vitals:   08/19/22 0414 08/19/22 0823 08/19/22 0913 08/19/22 0919  BP: (!) 90/48 108/67 101/68 111/62  Pulse: 95 91  85  Resp: 18 12  (!) 8  Temp: 98.2 F (36.8 C) 97.7 F (36.5 C) 98.1 F (36.7 C)   TempSrc: Oral Oral    SpO2: 98% 100%  100%  Weight:  106.6 kg    Height:  6' (1.829 m)      Intake/Output Summary (Last 24 hours) at 08/19/2022 1157 Last data filed at 08/19/2022 0917 Gross per 24 hour  Intake 1440 ml  Output 1300 ml  Net 140 ml   [employer]  08/16/22 1416 08/16/22 2036 08/19/22 0823  Weight: 109.1 kg 100 kg 106.6 kg    Examination:  General exam: Appears calm and comfortable  Respiratory system: Clear to auscultation. Respiratory effort normal. Cardiovascular system: S1 & S2 heard, RRR.  Gastrointestinal system: Abdomen is soft Central nervous system: Alert and awake Extremities: No edema Skin: No significant lesions noted Psychiatry: Flat affect. Rectal tube  with liquid output    Data Reviewed: I have personally reviewed following labs and imaging studies  CBC: Recent Labs  Lab 08/16/22 1540 08/17/22 0516 08/18/22 0543 08/18/22 1029 08/19/22 0617  WBC 5.1 4.5 4.9  --  4.5  NEUTROABS 4.0  --   --   --   --   HGB 11.4* 11.2* 9.5* 9.8* 10.1*  HCT 33.9* 32.2* 27.1* 27.5* 29.0*  MCV 109.7* 107.3* 104.6*  --  105.8*  PLT 79* 77* 79*  --  73*   Basic Metabolic Panel: Recent Labs  Lab 08/16/22 1540 08/17/22 0516 08/18/22 0543 08/19/22 0617  NA 133* 136 132* 132*  K 3.8 3.9 3.8 3.7  CL 111 115* 112* 112*  CO2 15* 13* 14* 16*  GLUCOSE 200* 123* 146* 116*  BUN 59* 63* 63* 62*  CREATININE 2.80* 2.62* 2.57* 2.03*  CALCIUM 9.6 9.7 9.7 9.9  MG  --   --  2.6* 2.3   GFR: Estimated Creatinine Clearance: 53.1 mL/min (A) (by C-G formula based on SCr of 2.03 mg/dL (H)). Liver Function Tests: Recent Labs  Lab 08/16/22 1540 08/18/22 0543 08/19/22 0617  AST 43* 34 34  ALT 30 22 22   ALKPHOS 151* 119 121  BILITOT 2.2* 1.9* 4.1*  PROT 7.3 6.6 6.8  ALBUMIN 3.9 4.1 4.3   No results for input(s): "LIPASE", "AMYLASE" in the last 168 hours. Recent Labs  Lab 08/16/22 1540 08/17/22 0957 08/18/22 0543 08/19/22 0617  AMMONIA 162* 94* 300* 84*   Coagulation Profile: Recent Labs  Lab 08/16/22 1540 08/18/22 0543 08/19/22 0617  INR 1.4* 1.4* 1.5*   Cardiac Enzymes: No results for input(s): "CKTOTAL", "CKMB", "CKMBINDEX", "TROPONINI" in the last 168 hours. BNP (last 3 results) No results for input(s): "PROBNP" in the last 8760 hours. HbA1C: No results for input(s): "HGBA1C" in the last 72 hours. CBG: No results for input(s): "GLUCAP" in the last 168 hours. Lipid Profile: No results for input(s): "CHOL", "HDL", "LDLCALC", "TRIG", "CHOLHDL", "LDLDIRECT" in the last 72 hours. Thyroid Function Tests: No results for input(s): "TSH", "T4TOTAL", "FREET4", "T3FREE", "THYROIDAB" in the last 72 hours. Anemia Panel: Recent Labs     08/17/22 0522  VITAMINB12 555  FOLATE 28.9   Sepsis Labs: No results for input(s): "PROCALCITON", "LATICACIDVEN" in the last 168 hours.  Recent Results (from the past 240 hour(s))  Culture, body fluid w Gram Stain-bottle     Status: None (Preliminary result)   Collection Time: 08/16/22  9:25 AM   Specimen: Peritoneal Washings  Result Value Ref Range Status   Specimen Description PERITONEAL  Final   Special Requests 100C  Final   Culture   Final    NO GROWTH 3 DAYS Performed at [facility], [address] [place], [place] [address]    Report Status PENDING  Incomplete  Gram stain     Status: None   Collection Time: 08/16/22  9:37 AM   Specimen: Peritoneal Washings  Result Value Ref Range Status   Specimen Description PERITONEAL  Final   Special Requests NONE  Final   Gram Stain   Final  WBC PRESENT, PREDOMINANTLY MONONUCLEAR NO ORGANISMS SEEN CYTOSPIN SMEAR Performed at [facility], [address] [place], [place] [address]    Report Status 08/16/2022 FINAL  Final  Culture, blood (Routine X 2) w Reflex to ID Panel     Status: None (Preliminary result)   Collection Time: 08/19/22  9:55 AM   Specimen: BLOOD LEFT HAND  Result Value Ref Range Status   Specimen Description   Final    BLOOD LEFT HAND BOTTLES DRAWN AEROBIC AND ANAEROBIC   Special Requests   Final    Blood Culture results may not be optimal due to an excessive volume of blood received in culture bottles Performed at [facility], [address], [place], [place] [address]    Culture PENDING  Incomplete   Report Status PENDING  Incomplete  Culture, blood (Routine X 2) w Reflex to ID Panel     Status: None (Preliminary result)   Collection Time: 08/19/22 10:05 AM   Specimen: BLOOD LEFT HAND  Result Value Ref Range Status   Specimen Description   Final    BLOOD LEFT HAND BOTTLES DRAWN AEROBIC AND ANAEROBIC   Special Requests   Final    Blood Culture results may not be optimal due to an excessive volume of  blood received in culture bottles Performed at [facility], [address] [place], [place] [address]    Culture PENDING  Incomplete   Report Status PENDING  Incomplete         Radiology Studies: No results found.      Scheduled Meds:  lactulose  30 g Oral Q6H   lactulose  300 mL Rectal Q6H   pantoprazole  40 mg Oral Daily   PARoxetine  40 mg Oral Daily   rifaximin  550 mg Oral BID   sucralfate  1 g Oral TID WC & HS   thiamine  100 mg Oral Daily   zinc sulfate  220 mg Oral Daily     LOS: 2 days    Time spent: 35 minutes    Sharonica Kraszewski Darleen Crocker, DO [facility]  If 7PM-7AM, please contact night-coverage [email or web address] 08/19/2022, 11:57 AM

## 2022-08-19 NOTE — Op Note (Signed)
[facility] Patient Name: Bradley Miles Procedure Date: 08/19/2022 8:12 AM MRN: [id number] Date of Birth: 06/05/1966 Attending MD: Maylon Peppers ,  CSN: [id number] Age: 56 Admit Type: Inpatient Procedure:                Upper GI endoscopy Indications:              Unexplained iron deficiency anemia, persistent                            hepatic encephalopathy Providers:                Maylon Peppers, Crystal Page, [place] Risa Grill, Technician, Everardo Pacific Referring MD:              Medicines:                Monitored Anesthesia Care Complications:            No immediate complications. Estimated Blood Loss:     Estimated blood loss: none. Procedure:                Pre-Anesthesia Assessment:                           - Prior to the procedure, a History and Physical                            was performed, and patient medications, allergies                            and sensitivities were reviewed. The patient's                            tolerance of previous anesthesia was reviewed.                           - The risks and benefits of the procedure and the                            sedation options and risks were discussed with the                            patient. All questions were answered and informed                            consent was obtained.                           - ASA Grade Assessment: III - A patient with severe                            systemic disease.                           After obtaining informed consent, the endoscope was  passed under direct vision. Throughout the                            procedure, the patient's blood pressure, pulse, and                            oxygen saturations were monitored continuously. The                            [id number]-H190 ([id number]) scope was introduced through the                            mouth, and advanced to the second part of duodenum.                             The upper GI endoscopy was accomplished without                            difficulty. The patient tolerated the procedure                            well. Scope In: 9:02:13 AM Scope Out: 9:07:12 AM Total Procedure Duration: 0 hours 4 minutes 59 seconds  Findings:      Grade II varices were found in the middle third of the esophagus and in       the lower third of the esophagus.      Type 2 gastroesophageal varices (GOV2, esophageal varices which extend       along the fundus) with no bleeding were found in the gastric fundus.       There were no stigmata of recent bleeding. However, there was presence       of two scars, concerning for some previous bleeding. They were medium in       largest diameter.      Moderate portal hypertensive gastropathy was found in the entire       examined stomach.      The examined duodenum was normal. Impression:               - Grade II esophageal varices.                           - Type 2 gastroesophageal varices (GOV2, esophageal                            varices which extend along the fundus), without                            bleeding.                           - Portal hypertensive gastropathy.                           - Normal examined duodenum.                           - No specimens  collected. Moderate Sedation:      Per Anesthesia Care Recommendation:           - Return patient to hospital ward for ongoing care.                           - Resume previous diet.                           - Consider starting low dose nadolol vs carvedilol                            in the next few days if MAPs are >65 mm Hg                            (esophagogastric variceal bleeding prevention)                           - Check urinalysis and blood cultures x2 today.                           - Continue Xifaxan, lactulose and Zn oxide. Procedure Code(s):        --- Professional ---                           [phone],  Esophagogastroduodenoscopy, flexible,                            transoral; diagnostic, including collection of                            specimen(s) by brushing or washing, when performed                            (separate procedure) Diagnosis Code(s):        --- Professional ---                           I85.00, Esophageal varices without bleeding                           I86.4, Gastric varices                           K76.6, Portal hypertension                           K31.89, Other diseases of stomach and duodenum                           D50.9, Iron deficiency anemia, unspecified CPT copyright 2019 American Medical Association. All rights reserved. The codes documented in this report are preliminary and upon coder review may  be revised to meet current compliance requirements. Maylon Peppers, MD Maylon Peppers,  08/19/2022 9:26:00 AM This report has been signed electronically. Number of Addenda: 0

## 2022-08-19 NOTE — Brief Op Note (Signed)
08/16/2022 - 08/19/2022  9:25 AM  PATIENT:  [name]  56 y.o. male  PRE-OPERATIVE DIAGNOSIS:  anemia, altered mental status  POST-OPERATIVE DIAGNOSIS:  Grade II esophageal varices; portal hypertensive gastropathy; gastric varices;   PROCEDURE:  Procedure(s): ESOPHAGOGASTRODUODENOSCOPY (EGD) WITH PROPOFOL (N/A)  SURGEON:  Surgeon(s) and Role:    * Harvel Quale, MD - Primary  Patient underwent EGD under propofol sedation.  Tolerated the procedure adequately.  Grade II varices were found in the middle third of the esophagus and in the lower third of the esophagus. Type 2 gastroesophageal varices (GOV2, esophageal varices which extend along the fundus) with no bleeding were found in the gastric fundus.  There were no stigmata of recent bleeding. However, there was presence of two scars, concerning for some previous bleeding.  They were medium in largest diameter. Moderate portal hypertensive gastropathy was found in the entire examined stomach. The examined duodenum was normal.   RECOMMENDATIONS - Return patient to hospital ward for ongoing care.  - Resume previous diet.  - Consider starting low dose nadolol vs carvedilol in the next few days if MAPs are >65 mm Hg (esophagogastric variceal bleeding prevention) - Check urinalysis and blood cultures x2 today. - Continue Xifaxan, lactulose and Zn oxide.  Maylon Peppers, MD Gastroenterology and Hepatology [facility]

## 2022-08-19 NOTE — Progress Notes (Signed)
We will proceed with EGD as scheduled.  I thoroughly discussed with the patient's family the procedure, including the risks involved. Patient understands what the procedure involves including the benefits and any risks. Patient understands alternatives to the proposed procedure. Risks including (but not limited to) bleeding, tearing of the lining (perforation), rupture of adjacent organs, problems with heart and lung function, infection, and medication reactions. A small percentage of complications may require surgery, hospitalization, repeat endoscopic procedure, and/or transfusion.  Family  understood and agreed.  Maylon Peppers, MD Gastroenterology and Hepatology [facility]

## 2022-08-19 NOTE — Progress Notes (Signed)
Patient alert and responsive during shift.  Patient was disoriented at times but easily reoriented.  No new complaints during shift.

## 2022-08-19 NOTE — Transfer of Care (Signed)
Immediate Anesthesia Transfer of Care Note  Patient: Bradley Miles  Procedure(s) Performed: ESOPHAGOGASTRODUODENOSCOPY (EGD) WITH PROPOFOL  Patient Location: PACU  Anesthesia Type:General  Level of Consciousness: awake, alert , sedated, drowsy and patient cooperative  Airway & Oxygen Therapy: Patient Spontanous Breathing  Post-op Assessment: Report given to RN and Post -op Vital signs reviewed and stable  Post vital signs: Reviewed and stable  Last Vitals:  Vitals Value Taken Time  BP 111/62 08/19/22 0919  Temp 36.7 C 08/19/22 0913  Pulse 85 08/19/22 0921  Resp 7 08/19/22 0921  SpO2 99 % 08/19/22 0921  Vitals shown include unvalidated device data.  Last Pain:  Vitals:   08/19/22 0913  TempSrc:   PainSc: Asleep         Complications: No notable events documented.

## 2022-08-19 NOTE — Anesthesia Postprocedure Evaluation (Signed)
Anesthesia Post Note  Patient: Bradley Miles  Procedure(s) Performed: ESOPHAGOGASTRODUODENOSCOPY (EGD) WITH PROPOFOL  Patient location during evaluation: PACU Anesthesia Type: General Level of consciousness: awake and alert and oriented Pain management: pain level controlled Vital Signs Assessment: post-procedure vital signs reviewed and stable Respiratory status: spontaneous breathing, nonlabored ventilation and respiratory function stable Cardiovascular status: blood pressure returned to baseline and stable Postop Assessment: no apparent nausea or vomiting Anesthetic complications: no   No notable events documented.   Last Vitals:  Vitals:   08/19/22 0913 08/19/22 0919  BP: 101/68 111/62  Pulse:  85  Resp:  (!) 8  Temp: 36.7 C   SpO2:  100%    Last Pain:  Vitals:   08/19/22 0919  TempSrc:   PainSc: 0-No pain                 Mukund Weinreb C Annis Lagoy

## 2022-08-20 ENCOUNTER — Telehealth: Payer: Self-pay | Admitting: Gastroenterology

## 2022-08-20 ENCOUNTER — Ambulatory Visit: Payer: Managed Care, Other (non HMO) | Admitting: Internal Medicine

## 2022-08-20 DIAGNOSIS — I864 Gastric varices: Secondary | ICD-10-CM

## 2022-08-20 DIAGNOSIS — K7682 Hepatic encephalopathy: Secondary | ICD-10-CM | POA: Diagnosis not present

## 2022-08-20 LAB — COMPREHENSIVE METABOLIC PANEL
ALT: 22 U/L (ref 0–44)
AST: 35 U/L (ref 15–41)
Albumin: 3.9 g/dL (ref 3.5–5.0)
Alkaline Phosphatase: 118 U/L (ref 38–126)
Anion gap: 6 (ref 5–15)
BUN: 63 mg/dL — ABNORMAL HIGH (ref 6–20)
CO2: 15 mmol/L — ABNORMAL LOW (ref 22–32)
Calcium: 9.9 mg/dL (ref 8.9–10.3)
Chloride: 112 mmol/L — ABNORMAL HIGH (ref 98–111)
Creatinine, Ser: 1.86 mg/dL — ABNORMAL HIGH (ref 0.61–1.24)
GFR, Estimated: 43 mL/min — ABNORMAL LOW (ref >60)
Glucose, Bld: 126 mg/dL — ABNORMAL HIGH (ref 70–99)
Potassium: 3.9 mmol/L (ref 3.5–5.1)
Sodium: 133 mmol/L — ABNORMAL LOW (ref 135–145)
Total Bilirubin: 3.3 mg/dL — ABNORMAL HIGH (ref 0.3–1.2)
Total Protein: 6.8 g/dL (ref 6.5–8.1)

## 2022-08-20 LAB — CBC
HCT: 29 % — ABNORMAL LOW (ref 39.0–52.0)
Hemoglobin: 10 g/dL — ABNORMAL LOW (ref 13.0–17.0)
MCH: 36.9 pg — ABNORMAL HIGH (ref 26.0–34.0)
MCHC: 34.5 g/dL (ref 30.0–36.0)
MCV: 107 fL — ABNORMAL HIGH (ref 80.0–100.0)
Platelets: 74 10*3/uL — ABNORMAL LOW (ref 150–400)
RBC: 2.71 MIL/uL — ABNORMAL LOW (ref 4.22–5.81)
RDW: 14.1 % (ref 11.5–15.5)
WBC: 4.8 10*3/uL (ref 4.0–10.5)
nRBC: 0 % (ref 0.0–0.2)

## 2022-08-20 LAB — PROTIME-INR
INR: 1.4 — ABNORMAL HIGH (ref 0.8–1.2)
Prothrombin Time: 17.4 seconds — ABNORMAL HIGH (ref 11.4–15.2)

## 2022-08-20 LAB — MAGNESIUM: Magnesium: 2.3 mg/dL (ref 1.7–2.4)

## 2022-08-20 LAB — AMMONIA: Ammonia: 77 umol/L — ABNORMAL HIGH (ref 9–35)

## 2022-08-20 MED ORDER — ONDANSETRON HCL 4 MG PO TABS: 4.0000 mg | ORAL_TABLET | Freq: Four times a day (QID) | ORAL | 0 refills | Status: DC | PRN

## 2022-08-20 MED ORDER — ZINC SULFATE 220 (50 ZN) MG PO CAPS: 220.0000 mg | ORAL_CAPSULE | Freq: Every day | ORAL | 0 refills | Status: DC

## 2022-08-20 MED ORDER — LACTULOSE 10 GM/15ML PO SOLN: 30.0000 g | Freq: Four times a day (QID) | ORAL | 0 refills | Status: DC

## 2022-08-20 NOTE — Progress Notes (Signed)
Gastroenterology Progress Note    Primary Care Physician:  [name], MD Primary Gastroenterologist:  Dr. Gala Romney   Patient ID: Bradley Miles; [id number]; 1966-05-06    Subjective   Alert and oriented. Wife at bedside. Patient able to converse appropriately. Desires flexiseal to be removed. Wife states insurance should cover [place] facility; however, there is a phone note from [place] dated 8/24 that per patient's plan, he is unable to receive care at [facility] for transplant services as he does not have transplant benefits and has to go to a [facility].   Denies abdominal pain, overt GI bleeding, confusion. He ate 3/4 gravy biscuit this morning.     Objective   Vital signs in last 24 hours Temp:  [98.1 F (36.7 C)-98.8 F (37.1 C)] 98.4 F (36.9 C) (09/11 3149) Pulse Rate:  [85-94] 93 (09/11 0613) Resp:  [8-16] 16 (09/11 0613) BP: (101-114)/(57-76) 114/76 (09/11 0613) SpO2:  [97 %-100 %] 97 % (09/11 7026) Last BM Date : 08/20/22  Physical Exam General:   Alert and oriented, chronically ill-appearing, sallow complexion  Head:  Normocephalic and atraumatic. Abdomen:  Bowel sounds present, abdomen full but non-tense, no TTP Msk:  Symmetrical without gross deformities. Normal posture. Extremities:  Without  edema. Neurologic:  Alert and  oriented x4; negative asterixis   Intake/Output from previous day: 09/10 0701 - 09/11 0700 In: 1960 [P.O.:360; I.V.:200] Out: 3600 [Urine:1200; Stool:2400] Intake/Output this shift: No intake/output data recorded.  Lab Results  Recent Labs    08/18/22 0543 08/18/22 1029 08/19/22 0617 08/20/22 0537  WBC 4.9  --  4.5 4.8  HGB 9.5* 9.8* 10.1* 10.0*  HCT 27.1* 27.5* 29.0* 29.0*  PLT 79*  --  73* 74*   BMET Recent Labs    08/18/22 0543 08/19/22 0617 08/20/22 0537  NA 132* 132* 133*  K 3.8 3.7 3.9  CL 112* 112* 112*  CO2 14* 16* 15*  GLUCOSE 146* 116* 126*  BUN 63* 62* 63*  CREATININE 2.57* 2.03* 1.86*  CALCIUM 9.7  9.9 9.9   LFT Recent Labs    08/18/22 0543 08/19/22 0617 08/20/22 0537  PROT 6.6 6.8 6.8  ALBUMIN 4.1 4.3 3.9  AST 34 34 35  ALT 22 22 22   ALKPHOS 119 121 118  BILITOT 1.9* 4.1* 3.3*   PT/INR Recent Labs    08/19/22 0617 08/20/22 0537  LABPROT 17.6* 17.4*  INR 1.5* 1.4*     Studies/Results CT Head Wo Contrast  Result Date: 08/16/2022 CLINICAL DATA:  Altered mental status EXAM: CT HEAD WITHOUT CONTRAST TECHNIQUE: Contiguous axial images were obtained from the base of the skull through the vertex without intravenous contrast. RADIATION DOSE REDUCTION: This exam was performed according to the departmental dose-optimization program which includes automated exposure control, adjustment of the mA and/or kV according to patient size and/or use of iterative reconstruction technique. COMPARISON:  Brain CT 01/15/2014 FINDINGS: Brain: No evidence of acute infarction, hemorrhage, hydrocephalus, extra-axial collection or mass lesion/mass effect. Vascular: No hyperdense vessel or unexpected calcification. Skull: Normal. Negative for fracture or focal lesion. Sinuses/Orbits: Mild paranasal sinus mucosal thickening. No air-fluid levels. Other: None. IMPRESSION: No acute intracranial process Electronically Signed   By: Lovey Newcomer M.D.   On: 08/16/2022 17:15   DG Chest Port 1 View  Result Date: 08/16/2022 CLINICAL DATA:  Altered mental status.  Prior thoracentesis EXAM: PORTABLE CHEST 1 VIEW COMPARISON:  chest radiograph November 16, 2020 FINDINGS: Monitoring leads overlie the patient. Stable cardiac and mediastinal contours.  No consolidative pulmonary opacities. No pleural effusion or pneumothorax. Thoracic spine degenerative changes. IMPRESSION: No acute cardiopulmonary process. Electronically Signed   By: Lovey Newcomer M.D.   On: 08/16/2022 15:18   US Paracentesis  Result Date: 08/16/2022 INDICATION: Recurrent ascites; Cirrhosis EXAM: ULTRASOUND GUIDED LLQ PARACENTESIS MEDICATIONS: 10 cc 1% lidocaine  COMPLICATIONS: None immediate. PROCEDURE: Informed written consent was obtained from the patient after a discussion of the risks, benefits and alternatives to treatment. A timeout was performed prior to the initiation of the procedure. Initial ultrasound scanning demonstrates a large amount of ascites within the left lower abdominal quadrant. The left lower abdomen was prepped and draped in the usual sterile fashion. 1% lidocaine was used for local anesthesia. Following this, a Yueh catheter was introduced. An ultrasound image was saved for documentation purposes. The paracentesis was performed. The catheter was removed and a dressing was applied. The patient tolerated the procedure well without immediate post procedural complication. Patient received post-procedure intravenous albumin; see nursing notes for details. FINDINGS: A total of approximately 6 liters of cloudy yellow fluid was removed. Samples were sent to the laboratory as requested by the clinical team. IMPRESSION: Successful ultrasound-guided paracentesis yielding 6 liters of peritoneal fluid. Read by Lavonia Drafts [facility] Electronically Signed   By: Lavonia Dana M.D.   On: 08/16/2022 10:46   US Paracentesis  Result Date: 08/09/2022 INDICATION: Recurrent ascites; Cirrhosis Known to [facility] IR. BRTO 11/2019; TIPS 11/24/2019; Coiling for recanalization of gastric varices 11/2020. Per GI MD notes pt has been in [place] with documentation of narrowed/occluded TIPS (new TIPS 01/2022 in [place] per notes) and has had several large volume paracentesis over last 2 years. Last para 08/01/22 per chart EXAM: ULTRASOUND GUIDED LLQ PARACENTESIS MEDICATIONS: 10 cc 1% lidocaine COMPLICATIONS: None immediate. PROCEDURE: Informed written consent was obtained from the patient after a discussion of the risks, benefits and alternatives to treatment. A timeout was performed prior to the initiation of the procedure. Initial ultrasound scanning demonstrates a  large amount of ascites within the left lower abdominal quadrant. The left lower abdomen was prepped and draped in the usual sterile fashion. 1% lidocaine was used for local anesthesia. Following this, a Yueh catheter was introduced. An ultrasound image was saved for documentation purposes. The paracentesis was performed. The catheter was removed and a dressing was applied. The patient tolerated the procedure well without immediate post procedural complication. Patient received post-procedure intravenous albumin; see nursing notes for details. FINDINGS: A total of approximately 8 liter limit of cloudy pale yellow fluid was removed. Samples were sent to the laboratory as requested by the clinical team. IMPRESSION: Successful ultrasound-guided paracentesis yielding 8 liters maximum per ordering MD of peritoneal fluid. PLAN: The patient has required >/=2 paracenteses in a 30 day period and a formal evaluation by the [facility] has been arranged. Read by Lavonia Drafts [facility] Electronically Signed   By: Ruthann Cancer M.D.   On: 08/09/2022 10:26    Assessment  56 y.o. 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 He was also found to have acute renal injury.   Encephalopathy: resolved now. Suspect multifactorial in setting of LVAP, dehydration, etc. Appropriate to discontinue lactulose enemas and flexiseal. Continue with oral lactulose and Xifaxan.  Blood cultures negative to date. UA has not  been collected.   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 MELD 3.0 is 24 today. EGD completed over the weekend with Grade 2 esophageal varices, Type 2 GE varices with presence of 2 scars concerning for prior bleeding. Moderate  portal gastropathy, normal duodenum. Recommend starting carvedilol in near future if MAP > 65 (BP 114/76 this morning with MAP of 89). Continue to follow trend. [facility] has not accepted him for evaluation as they have documented he does not have transplant benefits. It is unclear, as wife states they do. I have asked her to call her insurance again if needing clarification; in the interim, we have referred to [facility] for transplant evaluation.    Plan / Recommendations  May discontinue flexiseal and lactulose enemas Continue current oral lactulose and Xifaxan Referral to [facility] completed this morning Consider carvedilol if MAP continues to remain above 65 Hopeful discharge later today or tomorrow if tolerating diet and able to ambulate. May need PT Keep outpatient appt upcoming Any further LVAP should be limited to no more than 4 liters at a time    LOS: 3 days    08/20/2022, 9:04 AM  Annitta Needs, PhD, ANP-BC [facility] Gastroenterology

## 2022-08-20 NOTE — Telephone Encounter (Signed)
Pt wife called in. She said [facility] is listed as [facility] on pt insurance for liver transplant service but does not want to go to [facility] because she saw the survivor rate was not that good. She wants pt to be referred to 1 or both of these places that are also listed 1) [facility] 2) [facility] in [place].

## 2022-08-20 NOTE — Progress Notes (Signed)
Nsg Discharge Note  Admit Date:  08/16/2022 Discharge date: 08/20/2022   ERASTUS BARTOLOMEI to be D/C'd Home per MD order.  AVS completed.  Copy for chart, and copy for patient signed, and dated. Patient/caregiver able to verbalize understanding. IV removed discharge paper work given and reviewed with patient via charge nurse Audrea Muscat RN. Patient transported in wheelchair to vehicle   Discharge Medication: Allergies as of 08/20/2022   No Known Allergies      Medication List     TAKE these medications    Acti-Lance 28G Misc Use 3x per day as directed.   allopurinol 100 MG tablet Commonly known as: ZYLOPRIM Take 1 tablet (100 mg total) by mouth daily.   blood glucose meter kit and supplies Dispense based on patient and insurance preference. Test blood sugar once daily.  (FOR ICD-10 E10.9, E11.9).   bumetanide 1 MG tablet Commonly known as: BUMEX Take 1 mg by mouth daily.   folic acid 1 MG tablet Commonly known as: FOLVITE Take 1 mg by mouth daily.   gabapentin 600 MG tablet Commonly known as: NEURONTIN Take 600 mg by mouth every 8 (eight) hours.   Insulin Pen Needle 30G X 8 MM Misc Commonly known as: NOVOFINE Inject 10 each into the skin as needed.   lactulose 10 GM/15ML solution Commonly known as: CHRONULAC Take 45 mLs (30 g total) by mouth every 6 (six) hours. What changed:  how much to take how to take this when to take this additional instructions   omeprazole 40 MG capsule Commonly known as: PRILOSEC Take 1 capsule (40 mg total) by mouth in the morning and at bedtime.   ondansetron 4 MG disintegrating tablet Commonly known as: ZOFRAN-ODT DISSOLVE 1 TABLET ON THE TONGUE EVERY 8 HOURS AS NEEDED FOR NAUSEA   ondansetron 4 MG tablet Commonly known as: ZOFRAN Take 1 tablet (4 mg total) by mouth every 6 (six) hours as needed for nausea.   PARoxetine 40 MG tablet Commonly known as: PAXIL Take 1 tablet (40 mg total) by mouth daily.   potassium chloride SA 20  MEQ tablet Commonly known as: KLOR-CON M Take 1 tablet (20 mEq total) by mouth daily as needed (Take when taking Lasix).   sildenafil 100 MG tablet Commonly known as: VIAGRA Take 0.5 tablets (50 mg total) by mouth as needed for erectile dysfunction.   spironolactone 100 MG tablet Commonly known as: ALDACTONE TAKE 1 TABLET(100 MG) BY MOUTH DAILY   sucralfate 1 g tablet Commonly known as: CARAFATE TAKE 1 TABLET BY MOUTH BEFORE MEALS   thiamine 100 MG tablet Commonly known as: VITAMIN B1 Take 1 tablet (100 mg total) by mouth daily.   True Metrix Blood Glucose Test test strip Generic drug: glucose blood Use as instructed   Xifaxan 550 MG Tabs tablet Generic drug: rifaximin TAKE 1 TABLET(550 MG) BY MOUTH TWICE DAILY   zinc sulfate 220 (50 Zn) MG capsule Take 1 capsule (220 mg total) by mouth daily. Start taking on: August 21, 2022        Discharge Assessment: Vitals:   08/19/22 2156 08/20/22 0613  BP: (!) 110/57 114/76  Pulse: 94 93  Resp: 16 16  Temp: 98.8 F (37.1 C) 98.4 F (36.9 C)  SpO2: 100% 97%   Skin clean, dry and intact without evidence of skin break down, no evidence of skin tears noted. IV catheter discontinued intact. Site without signs and symptoms of complications - no redness or edema noted at insertion site, patient  denies c/o pain - only slight tenderness at site.  Dressing with slight pressure applied.  D/c Instructions-Education: Discharge instructions given to patient/family with verbalized understanding. D/c education completed with patient/family including follow up instructions, medication list, d/c activities limitations if indicated, with other d/c instructions as indicated by MD - patient able to verbalize understanding, all questions fully answered. Patient instructed to return to ED, call 911, or call MD for any changes in condition.  Patient escorted via [place], and D/C home via private auto.  Zenaida Deed, RN 08/20/2022 5:59 PM

## 2022-08-20 NOTE — Telephone Encounter (Signed)
Tammy:   He has standing orders for para with max of 8 liters. Can we change this to max of 4 liters? Thanks!  Madelyn Flavors who has seen him as outpatient.

## 2022-08-20 NOTE — Telephone Encounter (Signed)
Referral sent to [facility] transplant as previous was cancelled and sent to [facility]. Standing orders updated

## 2022-08-20 NOTE — Telephone Encounter (Signed)
Susan/Mandy:  Patient needs hospital follow-up sooner than October with [name]. In next 1-2 weeks if possible. Thanks!

## 2022-08-20 NOTE — Discharge Summary (Signed)
Physician Discharge Summary  Bradley Miles IEP:[id number] DOB: [birth year] DOA: 08/16/2022  PCP: Bradley Abraham, MD  Admit date: 08/16/2022  Discharge date: 08/20/2022  Admitted From:Home  Disposition:  Home  Recommendations for Outpatient Follow-up:  Follow up with PCP in 1-2 weeks Follow-up with GI locally and at [facility] Limit large-volume paracentesis to 4 L. Continue Xifaxan and oral lactulose as prescribed Continue other home medications as prior  Home Health: None  Equipment/Devices: None  Discharge Condition:Stable  CODE STATUS: Full  Diet recommendation: Heart Healthy/carb modified  Brief/Interim Summary: Bradley Miles is a 56 y.o. male with medical history significant for alcoholic liver cirrhosis, diabetes mellitus, hypertension, varices, COPD. Patient was brought to the ED reports of altered mental status.  Patient had paracentesis today, and on getting home, he took a nap and subsequently woke up confused and somnolent.  Patient was admitted with acute hepatic encephalopathy as well as AKI likely hepatorenal and in the setting of large volume paracentesis.  He has been given some albumin infusions on 9/8.  He has undergone EGD 9/10 with findings of varices and some recent stigmata of bleeding, but no active bleeding noted.  Blood cultures with no growth noted and hemoglobin levels have remained stable.  His AKI has resolved with the use of albumin.  His ammonia levels have decreased and his mentation has improved considerably back to baseline.  He is now tolerating diet and not requiring rectal tube oral lactulose enemas.  Discharge Diagnoses:  Principal Problem:   Hepatic encephalopathy ([facility]) Active Problems:   Hypertension   Alcoholic cirrhosis of liver with ascites (HCC)   Portal hypertension (HCC)   Thrombocytopenia (HCC)   Diabetes ([place])   Gastric varices   Acute kidney injury ([place])  Principal discharge diagnosis: Acute hepatic encephalopathy with AKI due to  hepatorenal syndrome.  Discharge Instructions  Discharge Instructions     Diet - low sodium heart healthy   Complete by: As directed    Increase activity slowly   Complete by: As directed    No wound care   Complete by: As directed       Allergies as of 08/20/2022   No Known Allergies      Medication List     TAKE these medications    Acti-Lance 28G Misc Use 3x per day as directed.   allopurinol 100 MG tablet Commonly known as: ZYLOPRIM Take 1 tablet (100 mg total) by mouth daily.   blood glucose meter kit and supplies Dispense based on patient and insurance preference. Test blood sugar once daily.  (FOR ICD-10 E10.9, E11.9).   bumetanide 1 MG tablet Commonly known as: BUMEX Take 1 mg by mouth daily.   folic acid 1 MG tablet Commonly known as: FOLVITE Take 1 mg by mouth daily.   gabapentin 600 MG tablet Commonly known as: NEURONTIN Take 600 mg by mouth every 8 (eight) hours.   Insulin Pen Needle 30G X 8 MM Misc Commonly known as: NOVOFINE Inject 10 each into the skin as needed.   lactulose 10 GM/15ML solution Commonly known as: CHRONULAC Take 45 mLs (30 g total) by mouth every 6 (six) hours. What changed:  how much to take how to take this when to take this additional instructions   omeprazole 40 MG capsule Commonly known as: PRILOSEC Take 1 capsule (40 mg total) by mouth in the morning and at bedtime.   ondansetron 4 MG disintegrating tablet Commonly known as: ZOFRAN-ODT DISSOLVE 1 TABLET ON THE TONGUE EVERY  8 HOURS AS NEEDED FOR NAUSEA   ondansetron 4 MG tablet Commonly known as: ZOFRAN Take 1 tablet (4 mg total) by mouth every 6 (six) hours as needed for nausea.   PARoxetine 40 MG tablet Commonly known as: PAXIL Take 1 tablet (40 mg total) by mouth daily.   potassium chloride SA 20 MEQ tablet Commonly known as: KLOR-CON M Take 1 tablet (20 mEq total) by mouth daily as needed (Take when taking Lasix).   sildenafil 100 MG  tablet Commonly known as: VIAGRA Take 0.5 tablets (50 mg total) by mouth as needed for erectile dysfunction.   spironolactone 100 MG tablet Commonly known as: ALDACTONE TAKE 1 TABLET(100 MG) BY MOUTH DAILY   sucralfate 1 g tablet Commonly known as: CARAFATE TAKE 1 TABLET BY MOUTH BEFORE MEALS   thiamine 100 MG tablet Commonly known as: VITAMIN B1 Take 1 tablet (100 mg total) by mouth daily.   True Metrix Blood Glucose Test test strip Generic drug: glucose blood Use as instructed   Xifaxan 550 MG Tabs tablet Generic drug: rifaximin TAKE 1 TABLET(550 MG) BY MOUTH TWICE DAILY   zinc sulfate 220 (50 Zn) MG capsule Take 1 capsule (220 mg total) by mouth daily. Start taking on: August 21, 2022        Follow-up Information     Bradley Abraham, MD. Schedule an appointment as soon as possible for a visit in 1 week(s).   Specialty: Internal Medicine Contact information: [address] [place] [address] [phone]                No Known Allergies  Consultations: GI   Procedures/Studies: CT Head Wo Contrast  Result Date: 08/16/2022 CLINICAL DATA:  Altered mental status EXAM: CT HEAD WITHOUT CONTRAST TECHNIQUE: Contiguous axial images were obtained from the base of the skull through the vertex without intravenous contrast. RADIATION DOSE REDUCTION: This exam was performed according to the departmental dose-optimization program which includes automated exposure control, adjustment of the mA and/or kV according to patient size and/or use of iterative reconstruction technique. COMPARISON:  Brain CT 01/15/2014 FINDINGS: Brain: No evidence of acute infarction, hemorrhage, hydrocephalus, extra-axial collection or mass lesion/mass effect. Vascular: No hyperdense vessel or unexpected calcification. Skull: Normal. Negative for fracture or focal lesion. Sinuses/Orbits: Mild paranasal sinus mucosal thickening. No air-fluid levels. Other: None. IMPRESSION: No acute  intracranial process Electronically Signed   By: Lovey Newcomer M.D.   On: 08/16/2022 17:15   DG Chest Port 1 View  Result Date: 08/16/2022 CLINICAL DATA:  Altered mental status.  Prior thoracentesis EXAM: PORTABLE CHEST 1 VIEW COMPARISON:  chest radiograph November 16, 2020 FINDINGS: Monitoring leads overlie the patient. Stable cardiac and mediastinal contours. No consolidative pulmonary opacities. No pleural effusion or pneumothorax. Thoracic spine degenerative changes. IMPRESSION: No acute cardiopulmonary process. Electronically Signed   By: Lovey Newcomer M.D.   On: 08/16/2022 15:18   US Paracentesis  Result Date: 08/16/2022 INDICATION: Recurrent ascites; Cirrhosis EXAM: ULTRASOUND GUIDED LLQ PARACENTESIS MEDICATIONS: 10 cc 1% lidocaine COMPLICATIONS: None immediate. PROCEDURE: Informed written consent was obtained from the patient after a discussion of the risks, benefits and alternatives to treatment. A timeout was performed prior to the initiation of the procedure. Initial ultrasound scanning demonstrates a large amount of ascites within the left lower abdominal quadrant. The left lower abdomen was prepped and draped in the usual sterile fashion. 1% lidocaine was used for local anesthesia. Following this, a Yueh catheter was introduced. An ultrasound image was  saved for documentation purposes. The paracentesis was performed. The catheter was removed and a dressing was applied. The patient tolerated the procedure well without immediate post procedural complication. Patient received post-procedure intravenous albumin; see nursing notes for details. FINDINGS: A total of approximately 6 liters of cloudy yellow fluid was removed. Samples were sent to the laboratory as requested by the clinical team. IMPRESSION: Successful ultrasound-guided paracentesis yielding 6 liters of peritoneal fluid. Read by Lavonia Drafts [facility] Electronically Signed   By: Lavonia Dana M.D.   On: 08/16/2022 10:46   US Paracentesis  Result  Date: 08/09/2022 INDICATION: Recurrent ascites; Cirrhosis Known to [facility]. BRTO 11/2019; TIPS 11/24/2019; Coiling for recanalization of gastric varices 11/2020. Per GI MD notes pt has been in [place] with documentation of narrowed/occluded TIPS (new TIPS 01/2022 in [place] per notes) and has had several large volume paracentesis over last 2 years. Last para 08/01/22 per chart EXAM: ULTRASOUND GUIDED LLQ PARACENTESIS MEDICATIONS: 10 cc 1% lidocaine COMPLICATIONS: None immediate. PROCEDURE: Informed written consent was obtained from the patient after a discussion of the risks, benefits and alternatives to treatment. A timeout was performed prior to the initiation of the procedure. Initial ultrasound scanning demonstrates a large amount of ascites within the left lower abdominal quadrant. The left lower abdomen was prepped and draped in the usual sterile fashion. 1% lidocaine was used for local anesthesia. Following this, a Yueh catheter was introduced. An ultrasound image was saved for documentation purposes. The paracentesis was performed. The catheter was removed and a dressing was applied. The patient tolerated the procedure well without immediate post procedural complication. Patient received post-procedure intravenous albumin; see nursing notes for details. FINDINGS: A total of approximately 8 liter limit of cloudy pale yellow fluid was removed. Samples were sent to the laboratory as requested by the clinical team. IMPRESSION: Successful ultrasound-guided paracentesis yielding 8 liters maximum per ordering MD of peritoneal fluid. PLAN: The patient has required >/=2 paracenteses in a 30 day period and a formal evaluation by the [facility] has been arranged. Read by Lavonia Drafts [facility] Electronically Signed   By: Ruthann Cancer M.D.   On: 08/09/2022 10:26     Discharge Exam: Vitals:   08/19/22 2156 08/20/22 0613  BP: (!) 110/57 114/76  Pulse:  94 93  Resp: 16 16  Temp: 98.8 F (37.1 C) 98.4 F (36.9 C)  SpO2: 100% 97%   Vitals:   08/19/22 0913 08/19/22 0919 08/19/22 2156 08/20/22 0613  BP: 101/68 111/62 (!) 110/57 114/76  Pulse:  85 94 93  Resp:  (!) 8 16 16   Temp: 98.1 F (36.7 C)  98.8 F (37.1 C) 98.4 F (36.9 C)  TempSrc:      SpO2:  100% 100% 97%  Weight:      Height:        General: Pt is alert, awake, not in acute distress Cardiovascular: RRR, S1/S2 +, no rubs, no gallops Respiratory: CTA bilaterally, no wheezing, no rhonchi Abdominal: Soft, NT, ND, bowel sounds + Extremities: no edema, no cyanosis    The results of significant diagnostics from this hospitalization (including imaging, microbiology, ancillary and laboratory) are listed below for reference.     Microbiology: Recent Results (from the past 240 hour(s))  Culture, body fluid w Gram Stain-bottle     Status: None (Preliminary result)   Collection Time: 08/16/22  9:25 AM   Specimen: Peritoneal Washings  Result Value Ref Range Status   Specimen Description PERITONEAL  Final   Special Requests 100C  Final   Culture   Final    NO GROWTH 4 DAYS Performed at [facility], [address] [place], [place] [address]    Report Status PENDING  Incomplete  Gram stain     Status: None   Collection Time: 08/16/22  9:37 AM   Specimen: Peritoneal Washings  Result Value Ref Range Status   Specimen Description PERITONEAL  Final   Special Requests NONE  Final   Gram Stain   Final    WBC PRESENT, PREDOMINANTLY MONONUCLEAR NO ORGANISMS SEEN CYTOSPIN SMEAR Performed at [facility], [address] [place], [place] [address]    Report Status 08/16/2022 FINAL  Final  Culture, blood (Routine X 2) w Reflex to ID Panel     Status: None (Preliminary result)   Collection Time: 08/19/22  9:55 AM   Specimen: BLOOD LEFT HAND  Result Value Ref Range Status   Specimen Description   Final    BLOOD LEFT HAND BOTTLES DRAWN AEROBIC AND ANAEROBIC   Special  Requests   Final    Blood Culture results may not be optimal due to an excessive volume of blood received in culture bottles   Culture   Final    NO GROWTH < 24 HOURS Performed at [facility], [address] [place], [place] [address]    Report Status PENDING  Incomplete  Culture, blood (Routine X 2) w Reflex to ID Panel     Status: None (Preliminary result)   Collection Time: 08/19/22 10:05 AM   Specimen: BLOOD LEFT HAND  Result Value Ref Range Status   Specimen Description   Final    BLOOD LEFT HAND BOTTLES DRAWN AEROBIC AND ANAEROBIC   Special Requests   Final    Blood Culture results may not be optimal due to an excessive volume of blood received in culture bottles   Culture   Final    NO GROWTH < 24 HOURS Performed at [facility], [address] [place], [place] [address]    Report Status PENDING  Incomplete     Labs: BNP (last 3 results) No results for input(s): "BNP" in the last 8760 hours. Basic Metabolic Panel: Recent Labs  Lab 08/16/22 1540 08/17/22 0516 08/18/22 0543 08/19/22 0617 08/20/22 0537  NA 133* 136 132* 132* 133*  K 3.8 3.9 3.8 3.7 3.9  CL 111 115* 112* 112* 112*  CO2 15* 13* 14* 16* 15*  GLUCOSE 200* 123* 146* 116* 126*  BUN 59* 63* 63* 62* 63*  CREATININE 2.80* 2.62* 2.57* 2.03* 1.86*  CALCIUM 9.6 9.7 9.7 9.9 9.9  MG  --   --  2.6* 2.3 2.3   Liver Function Tests: Recent Labs  Lab 08/16/22 1540 08/18/22 0543 08/19/22 0617 08/20/22 0537  AST 43* 34 34 35  ALT 30 22 22 22   ALKPHOS 151* 119 121 118  BILITOT 2.2* 1.9* 4.1* 3.3*  PROT 7.3 6.6 6.8 6.8  ALBUMIN 3.9 4.1 4.3 3.9   No results for input(s): "LIPASE", "AMYLASE" in the last 168 hours. Recent Labs  Lab 08/16/22 1540 08/17/22 0957 08/18/22 0543 08/19/22 0617 08/20/22 0538  AMMONIA 162* 94* 300* 84* 77*   CBC: Recent Labs  Lab 08/16/22 1540 08/17/22 0516 08/18/22 0543 08/18/22 1029 08/19/22 0617 08/20/22 0537  WBC 5.1 4.5 4.9  --  4.5 4.8  NEUTROABS 4.0  --   --   --    --   --   HGB 11.4* 11.2* 9.5*  9.8* 10.1* 10.0*  HCT 33.9* 32.2* 27.1* 27.5* 29.0* 29.0*  MCV 109.7* 107.3* 104.6*  --  105.8* 107.0*  PLT 79* 77* 79*  --  73* 74*   Cardiac Enzymes: No results for input(s): "CKTOTAL", "CKMB", "CKMBINDEX", "TROPONINI" in the last 168 hours. BNP: Invalid input(s): "POCBNP" CBG: No results for input(s): "GLUCAP" in the last 168 hours. D-Dimer No results for input(s): "DDIMER" in the last 72 hours. Hgb A1c No results for input(s): "HGBA1C" in the last 72 hours. Lipid Profile No results for input(s): "CHOL", "HDL", "LDLCALC", "TRIG", "CHOLHDL", "LDLDIRECT" in the last 72 hours. Thyroid function studies No results for input(s): "TSH", "T4TOTAL", "T3FREE", "THYROIDAB" in the last 72 hours.  Invalid input(s): "FREET3" Anemia work up No results for input(s): "VITAMINB12", "FOLATE", "FERRITIN", "TIBC", "IRON", "RETICCTPCT" in the last 72 hours. Urinalysis    Component Value Date/Time   COLORURINE AMBER (A) 11/17/2020 0645   APPEARANCEUR CLEAR 11/17/2020 0645   LABSPEC 1.031 (H) 11/17/2020 0645   PHURINE 5.0 11/17/2020 0645   GLUCOSEU NEGATIVE 11/17/2020 0645   HGBUR SMALL (A) 11/17/2020 0645   BILIRUBINUR NEGATIVE 11/17/2020 0645   KETONESUR NEGATIVE 11/17/2020 0645   PROTEINUR NEGATIVE 11/17/2020 0645   NITRITE NEGATIVE 11/17/2020 0645   LEUKOCYTESUR NEGATIVE 11/17/2020 0645   Sepsis Labs Recent Labs  Lab 08/17/22 0516 08/18/22 0543 08/19/22 0617 08/20/22 0537  WBC 4.5 4.9 4.5 4.8   Microbiology Recent Results (from the past 240 hour(s))  Culture, body fluid w Gram Stain-bottle     Status: None (Preliminary result)   Collection Time: 08/16/22  9:25 AM   Specimen: Peritoneal Washings  Result Value Ref Range Status   Specimen Description PERITONEAL  Final   Special Requests 100C  Final   Culture   Final    NO GROWTH 4 DAYS Performed at [facility], [address], [place], [place] [address]    Report Status PENDING  Incomplete   Gram stain     Status: None   Collection Time: 08/16/22  9:37 AM   Specimen: Peritoneal Washings  Result Value Ref Range Status   Specimen Description PERITONEAL  Final   Special Requests NONE  Final   Gram Stain   Final    WBC PRESENT, PREDOMINANTLY MONONUCLEAR NO ORGANISMS SEEN CYTOSPIN SMEAR Performed at [facility], [address], [place], [place] [address]    Report Status 08/16/2022 FINAL  Final  Culture, blood (Routine X 2) w Reflex to ID Panel     Status: None (Preliminary result)   Collection Time: 08/19/22  9:55 AM   Specimen: BLOOD LEFT HAND  Result Value Ref Range Status   Specimen Description   Final    BLOOD LEFT HAND BOTTLES DRAWN AEROBIC AND ANAEROBIC   Special Requests   Final    Blood Culture results may not be optimal due to an excessive volume of blood received in culture bottles   Culture   Final    NO GROWTH < 24 HOURS Performed at [facility], [address] [place], [place] [address]    Report Status PENDING  Incomplete  Culture, blood (Routine X 2) w Reflex to ID Panel     Status: None (Preliminary result)   Collection Time: 08/19/22 10:05 AM   Specimen: BLOOD LEFT HAND  Result Value Ref Range Status   Specimen Description   Final    BLOOD LEFT HAND BOTTLES DRAWN AEROBIC AND ANAEROBIC   Special Requests   Final    Blood Culture results may not  be optimal due to an excessive volume of blood received in culture bottles   Culture   Final    NO GROWTH < 24 HOURS Performed at [facility], [address], [place], [place] [address]    Report Status PENDING  Incomplete     Time coordinating discharge: 35 minutes  SIGNED:   Rodena Goldmann, DO [facility] 08/20/2022, 4:50 PM  If 7PM-7AM, please contact night-coverage [email or web address]

## 2022-08-21 ENCOUNTER — Telehealth: Payer: Self-pay | Admitting: Internal Medicine

## 2022-08-21 ENCOUNTER — Other Ambulatory Visit: Payer: Self-pay | Admitting: Gastroenterology

## 2022-08-21 DIAGNOSIS — K7031 Alcoholic cirrhosis of liver with ascites: Secondary | ICD-10-CM

## 2022-08-21 DIAGNOSIS — K7682 Hepatic encephalopathy: Secondary | ICD-10-CM

## 2022-08-21 DIAGNOSIS — R112 Nausea with vomiting, unspecified: Secondary | ICD-10-CM

## 2022-08-21 LAB — CULTURE, BODY FLUID W GRAM STAIN -BOTTLE: Culture: NO GROWTH

## 2022-08-21 MED ORDER — PANTOPRAZOLE SODIUM 40 MG PO TBEC: 40.0000 mg | DELAYED_RELEASE_TABLET | Freq: Two times a day (BID) | ORAL | 3 refills | Status: DC

## 2022-08-21 MED ORDER — LACTULOSE 10 GM/15ML PO SOLN: 30.0000 g | Freq: Four times a day (QID) | ORAL | 8 refills | Status: DC

## 2022-08-21 NOTE — Telephone Encounter (Signed)
Pt's wife came to the front window wanting to speak with Mindy about referral and to [facility] about 2 medications. I told her Mindy would be back tomorrow and all the nurses were with the afternoon appointments and they would have to call her. [phone]

## 2022-08-21 NOTE — Telephone Encounter (Signed)
Referral sent to [facility]

## 2022-08-21 NOTE — Telephone Encounter (Signed)
Orders updated

## 2022-08-22 NOTE — Telephone Encounter (Signed)
Noted  

## 2022-08-23 ENCOUNTER — Ambulatory Visit (INDEPENDENT_AMBULATORY_CARE_PROVIDER_SITE_OTHER): Payer: Managed Care, Other (non HMO) | Admitting: Internal Medicine

## 2022-08-23 ENCOUNTER — Ambulatory Visit (HOSPITAL_COMMUNITY)
Admission: RE | Admit: 2022-08-23 | Discharge: 2022-08-23 | Disposition: A | Discharge status: Home / Self Care | Payer: Managed Care, Other (non HMO) | Source: Ambulatory Visit | Attending: Gastroenterology

## 2022-08-23 ENCOUNTER — Inpatient Hospital Stay (HOSPITAL_COMMUNITY): Admission: RE | Admit: 2022-08-23 | Payer: Managed Care, Other (non HMO) | Source: Ambulatory Visit

## 2022-08-23 ENCOUNTER — Telehealth: Payer: Self-pay | Admitting: *Deleted

## 2022-08-23 ENCOUNTER — Encounter (HOSPITAL_COMMUNITY): Payer: Self-pay

## 2022-08-23 ENCOUNTER — Encounter: Payer: Self-pay | Admitting: Internal Medicine

## 2022-08-23 DIAGNOSIS — R188 Other ascites: Secondary | ICD-10-CM | POA: Insufficient documentation

## 2022-08-23 DIAGNOSIS — K7031 Alcoholic cirrhosis of liver with ascites: Secondary | ICD-10-CM | POA: Diagnosis not present

## 2022-08-23 LAB — BODY FLUID CELL COUNT WITH DIFFERENTIAL
Eos, Fluid: 0 %
Lymphs, Fluid: 23 %
Monocyte-Macrophage-Serous Fluid: 73 % (ref 50–90)
Neutrophil Count, Fluid: 4 % (ref 0–25)
Total Nucleated Cell Count, Fluid: 267 cu mm (ref 0–1000)

## 2022-08-23 LAB — GRAM STAIN

## 2022-08-23 NOTE — Telephone Encounter (Signed)
Received approval for Pantoprazole 40mg 

## 2022-08-23 NOTE — Progress Notes (Signed)
Patient tolerated left sided paracentesis procedure well today and 3.4 Liters of clear yellow ascites removed and labs sent for processing. Pt verbalized understanding of discharge instructions and left with wife via wheelchair with no acute distress noted.

## 2022-08-23 NOTE — Assessment & Plan Note (Signed)
Bradley Miles presented to establish care today.  On intake, his wife reported that he had fallen multiple times in the last 24 hours, including in our parking lot on the way into the practice.  He was borderline hypotensive and asterixis was noted on exam.  He was recently admitted to [facility] for hepatic encephalopathy and is currently undergoing transplant evaluation.  Given his presentation and exam findings today, I recommended Bradley Miles present to the emergency department for stabilization due to concern for recurrent hepatic encephalopathy.  Bradley Miles wife stated that she has been in contact with [facility] transplant center and has spoken with their coordinator.  She states that she was told Bradley Miles should present to [facility] for evaluation if further hospitalization was recommended.  I spoke with Roseanne Kaufman, NP with [facility] who agreed with my recommendation for Bradley Miles to present to the emergency department for evaluation. This was communicated to Bradley Miles and his wife. His wife suggested that they would not be returning to [facility] for evaluation, not due to dissatisfaction with his treatment but rather to seek care at a tertiary center, and would likely be traveling to [place], [place] for evaluation at [facility].  -We will tentatively plan for follow up in 4 weeks

## 2022-08-23 NOTE — Progress Notes (Signed)
New Patient Office Visit  Subjective    Patient ID: Bradley Miles, male    DOB: 1966/03/19  Age: 56 y.o. MRN: [id number]  CC:  Chief Complaint  Patient presents with   Establish Care   HPI Bradley Miles presents to establish care.  He is a 56 year old male with a past medical history significant for alcoholic cirrhosis complicated by esophageal varices, HE, portal gastropathy, HTN, COPD, GERD, T2DM, peripheral neuropathy, and anxiety/depression.  He is accompanied by his wife today.  Bradley Miles wife reported that he has fallen multiple times in the past 24 hoursduring patient intake.  He underwent large-volume paracentesis earlier today with 3.8 L removed.  He was borderline hypotensive (98/58).  Bradley Miles was recently discharged from [facility] following a 4-day hospitalization (9/7 - 9/11) for hepatic encephalopathy.  He is undergoing transplant evaluation and his wife has been in contact with the transplant coordinator at [facility] in [place], [place].  Bradley Miles states that he feels okay.  He states that his falls are related to his legs giving out on him.  His wife feels that his mentation is "pretty good" today relative to past episodes of HE.  Outpatient Encounter Medications as of 08/23/2022  Medication Sig   allopurinol (ZYLOPRIM) 100 MG tablet Take 1 tablet (100 mg total) by mouth daily.   bumetanide (BUMEX) 1 MG tablet Take 1 mg by mouth daily.   folic acid (FOLVITE) 1 MG tablet Take 1 mg by mouth daily.   gabapentin (NEURONTIN) 600 MG tablet Take 600 mg by mouth every 8 (eight) hours.   lactulose (CHRONULAC) 10 GM/15ML solution Take 45 mLs (30 g total) by mouth every 6 (six) hours. Titrate as needed to have 2-4 bowel movements daily.   ondansetron (ZOFRAN) 4 MG tablet Take 1 tablet (4 mg total) by mouth every 6 (six) hours as needed for nausea.   PARoxetine (PAXIL) 40 MG tablet Take 1 tablet (40 mg total) by mouth daily.   potassium chloride SA (KLOR-CON) 20 MEQ tablet  Take 1 tablet (20 mEq total) by mouth daily as needed (Take when taking Lasix).   spironolactone (ALDACTONE) 100 MG tablet TAKE 1 TABLET(100 MG) BY MOUTH DAILY   sucralfate (CARAFATE) 1 g tablet TAKE 1 TABLET BY MOUTH BEFORE MEALS   XIFAXAN 550 MG TABS tablet TAKE 1 TABLET(550 MG) BY MOUTH TWICE DAILY   blood glucose meter kit and supplies Dispense based on patient and insurance preference. Test blood sugar once daily.  (FOR ICD-10 E10.9, E11.9). (Patient not taking: Reported on 08/17/2022)   glucose blood (TRUE METRIX BLOOD GLUCOSE TEST) test strip Use as instructed (Patient not taking: Reported on 08/17/2022)   Insulin Pen Needle (NOVOFINE) 30G X 8 MM MISC Inject 10 each into the skin as needed. (Patient not taking: Reported on 08/17/2022)   Lancets (ACTI-LANCE 28G) MISC Use 3x per day as directed. (Patient not taking: Reported on 08/17/2022)   pantoprazole (PROTONIX) 40 MG tablet Take 1 tablet (40 mg total) by mouth 2 (two) times daily before a meal.   [DISCONTINUED] ondansetron (ZOFRAN-ODT) 4 MG disintegrating tablet DISSOLVE 1 TABLET ON THE TONGUE EVERY 8 HOURS AS NEEDED FOR NAUSEA (Patient not taking: Reported on 08/07/2022)   [DISCONTINUED] sildenafil (VIAGRA) 100 MG tablet Take 0.5 tablets (50 mg total) by mouth as needed for erectile dysfunction.   [DISCONTINUED] thiamine 100 MG tablet Take 1 tablet (100 mg total) by mouth daily.   [DISCONTINUED] zinc sulfate 220 (50 Zn) MG capsule  Take 1 capsule (220 mg total) by mouth daily.   No facility-administered encounter medications on file as of 08/23/2022.   Past Medical History:  Diagnosis Date   Alcoholic cirrhosis (HCC)    Anxiety    Controlled type 2 diabetes mellitus with complication, without long-term current use of insulin (HCC) 06/26/2020   Enlarged liver    GERD (gastroesophageal reflux disease)    Hypertension    Palpitations    PVC's (premature ventricular contractions)    Shortness of breath    Tussive syncope 12/22/2019   Varicose  veins    Past Surgical History:  Procedure Laterality Date   BIOPSY  02/02/2019   Procedure: BIOPSY;  Surgeon: Rourk, Robert M, MD;  [facility];  Service: Endoscopy;;  gastric   COLONOSCOPY WITH ESOPHAGOGASTRODUODENOSCOPY (EGD)  12/16/2012   internal hemorrhoids, colonic diverticulosis, benign polyps, screening in 2024. EGD with mild erosive reflux esophagitis, small hiatal hernia, negative H.pylori   COLONOSCOPY WITH PROPOFOL N/A 06/22/2021   Procedure: COLONOSCOPY WITH PROPOFOL;  Surgeon: Rourk, Robert M, MD;  [facility];  Service: Endoscopy;  Laterality: N/A;  1:45pm   cyst reomved     from spine   ESOPHAGOGASTRODUODENOSCOPY  05/19/09   RMR: Geographic distal esophageal erosions consistent with severe erosive reflux esophagitis. schatzki's ring s/P dilation/small hiatal hernia otherwise normal stomach   ESOPHAGOGASTRODUODENOSCOPY (EGD) WITH PROPOFOL N/A 02/02/2019   Dr. Rourk: retained gastric contents. portal HTN gastropathy   ESOPHAGOGASTRODUODENOSCOPY (EGD) WITH PROPOFOL N/A 11/20/2019   Procedure: ESOPHAGOGASTRODUODENOSCOPY (EGD) WITH PROPOFOL;  Surgeon: Rourk, Robert M, MD;  [facility];  Service: Endoscopy;  Laterality: N/A;   ESOPHAGOGASTRODUODENOSCOPY (EGD) WITH PROPOFOL N/A 11/24/2019   Procedure: ESOPHAGOGASTRODUODENOSCOPY (EGD) WITH PROPOFOL;  Surgeon: Karki, Arya, MD;  [facility];  Service: Gastroenterology;  Laterality: N/A;   ESOPHAGOGASTRODUODENOSCOPY (EGD) WITH PROPOFOL N/A 08/08/2020   Procedure: ESOPHAGOGASTRODUODENOSCOPY (EGD) WITH PROPOFOL;  Surgeon: Rourk, Robert M, MD;  [facility];  Service: Endoscopy;  Laterality: N/A;   ESOPHAGOGASTRODUODENOSCOPY (EGD) WITH PROPOFOL N/A 11/15/2020   Procedure: ESOPHAGOGASTRODUODENOSCOPY (EGD) WITH PROPOFOL;  Surgeon: Stark, Malcolm T, MD;  [facility];  Service: Endoscopy;  Laterality: N/A;   IR ANGIOGRAM SELECTIVE EACH ADDITIONAL VESSEL  11/22/2019   IR ANGIOGRAM  SELECTIVE EACH ADDITIONAL VESSEL  11/24/2019   IR ANGIOGRAM SELECTIVE EACH ADDITIONAL VESSEL  11/24/2019   IR ANGIOGRAM SELECTIVE EACH ADDITIONAL VESSEL  11/24/2019   IR ANGIOGRAM SELECTIVE EACH ADDITIONAL VESSEL  11/15/2020   IR EMBO ART  VEN HEMORR LYMPH EXTRAV  INC GUIDE ROADMAPPING  11/22/2019   IR EMBO ART  VEN HEMORR LYMPH EXTRAV  INC GUIDE ROADMAPPING  11/24/2019   IR EMBO ART  VEN HEMORR LYMPH EXTRAV  INC GUIDE ROADMAPPING  11/15/2020   IR FLUORO GUIDE CV LINE RIGHT  11/15/2020   IR RADIOLOGIST EVAL & MGMT  12/29/2019   IR RADIOLOGIST EVAL & MGMT  10/27/2020   IR RADIOLOGIST EVAL & MGMT  04/19/2021   IR TIPS  11/24/2019   IR US GUIDE VASC ACCESS RIGHT  11/22/2019   IR US GUIDE VASC ACCESS RIGHT  11/15/2020   IR US GUIDE VASC ACCESS RIGHT  11/15/2020   IR VENOGRAM RENAL UNI LEFT  11/22/2019   IR VENOGRAM RENAL UNI LEFT  11/15/2020   POLYPECTOMY  06/22/2021   Procedure: POLYPECTOMY;  Surgeon: Rourk, Robert M, MD;  [facility];  Service: Endoscopy;;   RADIOLOGY WITH ANESTHESIA N/A 11/24/2019   Procedure: IR WITH   ANESTHESIA;  Surgeon: Radiologist, Medication, MD;  [facility];  Service: Radiology;  Laterality: N/A;   RADIOLOGY WITH ANESTHESIA N/A 11/15/2020   Procedure: IR WITH ANESTHESIA;  Surgeon: Suttle, Dylan J, MD;  [facility];  Service: Radiology;  Laterality: N/A;   TIPS PROCEDURE N/A 11/22/2019   Procedure: BRTO/TRANS-JUGULAR INTRAHEPATIC PORTAL SHUNT (TIPS);  Surgeon: Wagner, Jaime, DO;  Location[facility];  Service: Anesthesiology;  Laterality: N/A;   WISDOM TOOTH EXTRACTION     Family History  Problem Relation Age of Onset   Cancer Mother        ? etiology   Heart disease Mother    Colon polyps Sister 45       two sisters   Brain cancer Father    Liver disease Neg Hx    Social History   Socioeconomic History   Marital status: Married    Spouse name: Not on file   Number of children: 2   Years of education: 12   Highest education level: High school  graduate  Occupational History   Occupation: [employer]    Comment: chemical mixing operator  Tobacco Use   Smoking status: Every Day    Packs/day: 1.00    Years: 30.00    Total pack years: 30.00    Types: Cigarettes    Start date: 12/24/1979   Smokeless tobacco: Never  Vaping Use   Vaping Use: Never used  Substance and Sexual Activity   Alcohol use: Yes    Alcohol/week: 0.0 standard drinks of alcohol    Comment: 05/09/21 "6 coolers/day"   Drug use: No   Sexual activity: Yes    Partners: Female    Birth control/protection: None  Other Topics Concern   Not on file  Social History Narrative   Lives at home with family.   Right-handed.   No daily use of caffeine.   Social Determinants of Health   Financial Resource Strain: Not on file  Food Insecurity: Not on file  Transportation Needs: Not on file  Physical Activity: Not on file  Stress: Not on file  Social Connections: Not on file  Intimate Partner Violence: Not on file   Review of Systems  Neurological:  Positive for dizziness and weakness.       Multiple falls in last 24 hours      Objective    BP (!) 98/58   Pulse 92   Ht 6' (1.829 m)   Wt 219 lb 6.4 oz (99.5 kg)   SpO2 99%   BMI 29.76 kg/m   Physical Exam Vitals reviewed.  Constitutional:      Comments: Wheelchair bound  Eyes:     General: No scleral icterus.    Extraocular Movements: Extraocular movements intact.     Conjunctiva/sclera: Conjunctivae normal.     Pupils: Pupils are equal, round, and reactive to light.  Cardiovascular:     Rate and Rhythm: Normal rate and regular rhythm.     Pulses: Normal pulses.     Heart sounds: Normal heart sounds. No murmur heard.    No friction rub. No gallop.  Pulmonary:     Effort: Pulmonary effort is normal.     Breath sounds: Normal breath sounds. No wheezing, rhonchi or rales.  Abdominal:     General: Bowel sounds are normal. There is no distension.     Palpations: Abdomen is soft.      Tenderness: There is no abdominal tenderness.  Musculoskeletal:     Right lower leg:   No edema.     Left lower leg: No edema.  Skin:    General: Skin is warm and dry.     Capillary Refill: Capillary refill takes less than 2 seconds.     Coloration: Skin is not jaundiced.  Neurological:     General: No focal deficit present.     Mental Status: He is alert.     Comments: Asterixis present, metabolic tremor noted    Assessment & Plan:   Problem List Items Addressed This Visit       Digestive   Alcoholic cirrhosis of liver with ascites (HCC)    Mr. Strothers presented to establish care today.  On intake, his wife reported that he had fallen multiple times in the last 24 hours, including in our parking lot on the way into the practice.  He was borderline hypotensive and asterixis was noted on exam.  He was recently admitted to [facility] for hepatic encephalopathy and is currently undergoing transplant evaluation.  Given his presentation and exam findings today, I recommended Mr. Otter present to the emergency department for stabilization due to concern for recurrent hepatic encephalopathy.  Mr. Hobdy's wife stated that she has been in contact with [facility] transplant center and has spoken with their coordinator.  She states that she was told Mr. Bendorf should present to [facility] for evaluation if further hospitalization was recommended.  I spoke with Anna Boone, NP with [facility] who agreed with my recommendation for Mr. Harvill to present to the emergency department for evaluation. This was communicated to Mr. Borrero and his wife. His wife suggested that they would not be returning to [place] for evaluation, not due to dissatisfaction with his treatment but rather to seek care at a tertiary center, and would likely be traveling to [place], [place] for evaluation at [facility].  -We will tentatively plan for follow up in 4 weeks       Return in about 4 weeks (around  09/20/2022).    E , MD   

## 2022-08-23 NOTE — Procedures (Signed)
   US guided LLQ paracentesis  3.4 L cloudy yellow fluid Sent for labs per MD  Tolerated well  EBL: less that 1 cc

## 2022-08-24 ENCOUNTER — Encounter (HOSPITAL_COMMUNITY): Payer: Self-pay | Admitting: Gastroenterology

## 2022-08-24 LAB — CYTOLOGY - NON PAP

## 2022-08-25 LAB — CULTURE, BLOOD (ROUTINE X 2)
Culture: NO GROWTH
Culture: NO GROWTH

## 2022-08-27 ENCOUNTER — Telehealth: Payer: Self-pay | Admitting: Interventional Radiology

## 2022-08-27 NOTE — Telephone Encounter (Signed)
Called patient to schedule CT and F/u with Dr Serafina Royals. Patient stated he was currently in the hospital and would like a call back in 1 month or so/sb

## 2022-08-28 LAB — CULTURE, BODY FLUID W GRAM STAIN -BOTTLE: Culture: NO GROWTH

## 2022-08-28 NOTE — Telephone Encounter (Signed)
Noted  

## 2022-08-29 ENCOUNTER — Encounter: Payer: Self-pay | Admitting: *Deleted

## 2022-08-30 NOTE — Telephone Encounter (Signed)
In person would be best. Can be any APP. We just need him seen sooner. Thanks!

## 2022-08-31 ENCOUNTER — Ambulatory Visit: Payer: Managed Care, Other (non HMO) | Admitting: Gastroenterology

## 2022-09-07 ENCOUNTER — Encounter (HOSPITAL_COMMUNITY): Payer: Self-pay

## 2022-09-07 ENCOUNTER — Ambulatory Visit (HOSPITAL_COMMUNITY)
Admission: RE | Admit: 2022-09-07 | Discharge: 2022-09-07 | Disposition: A | Discharge status: Home / Self Care | Payer: Managed Care, Other (non HMO) | Source: Ambulatory Visit | Attending: Gastroenterology

## 2022-09-07 VITALS — BP 104/62 | HR 78 | Temp 97.8°F | Resp 18

## 2022-09-07 DIAGNOSIS — R188 Other ascites: Secondary | ICD-10-CM | POA: Diagnosis not present

## 2022-09-07 DIAGNOSIS — K7031 Alcoholic cirrhosis of liver with ascites: Secondary | ICD-10-CM | POA: Diagnosis present

## 2022-09-07 LAB — BODY FLUID CELL COUNT WITH DIFFERENTIAL
Eos, Fluid: 0 %
Lymphs, Fluid: 20 %
Monocyte-Macrophage-Serous Fluid: 74 % (ref 50–90)
Neutrophil Count, Fluid: 6 % (ref 0–25)
Total Nucleated Cell Count, Fluid: 108 cu mm (ref 0–1000)

## 2022-09-07 LAB — GRAM STAIN: Gram Stain: NONE SEEN

## 2022-09-07 MED ORDER — ALBUMIN HUMAN 25 % IV SOLN
0.0000 g | Freq: Once | INTRAVENOUS | Status: DC
  Filled 2022-09-07: qty 400

## 2022-09-07 NOTE — Progress Notes (Signed)
Patient tolerated left sided paracentesis procedure well today and 4 Liters of hazy yellow ascites removed and labs sent for processing. Pt verbalized understanding of discharge instructions and ambulatory at departure with no acute distress noted.

## 2022-09-07 NOTE — Procedures (Signed)
PreOperative Dx: Alcoholic cirrhosis, ascites Postoperative Dx: Alcoholic cirrhosis, ascites Procedure:   US guided paracentesis Radiologist:  Thornton Papas Anesthesia:  10 ml of1% lidocaine Specimen:  4 L of slightly hazy yellow ascitic fluid EBL:   < 1 ml Complications: None

## 2022-09-10 ENCOUNTER — Other Ambulatory Visit: Payer: Self-pay | Admitting: Gastroenterology

## 2022-09-10 DIAGNOSIS — K7682 Hepatic encephalopathy: Secondary | ICD-10-CM

## 2022-09-10 DIAGNOSIS — K7031 Alcoholic cirrhosis of liver with ascites: Secondary | ICD-10-CM

## 2022-09-10 LAB — CYTOLOGY - NON PAP

## 2022-09-10 MED ORDER — LACTULOSE 10 GM/15ML PO SOLN: 30.0000 g | Freq: Four times a day (QID) | ORAL | 8 refills | Status: DC

## 2022-09-10 NOTE — Progress Notes (Signed)
Spoke to patient's wife and he was recently at [facility] for HE and N/V and was there for 6 days. She states his MELD was 28 on admission and on discharge was either 23 or 25.    She has ran out of lactulose with only able to give one more dose today. She has been giving him 40-45 cc about 4-6 times per day to have 3-4 bowel movements daily. He sometimes does vomit up doses and at times may miss a dose or 2 due to not wanting to take it. She would like refills and has requested these before and does not know where the pharmacy is sending the requests to. She wants to make sure he does not miss a dose tonight or tomorrow prior to his appointment.    I notified her that I am refilling the medication today with extra refills and we will discuss everything else further tomorrow at his follow up.    Venetia Night, MSN, APRN, FNP-BC, AGACNP-BC [facility]

## 2022-09-10 NOTE — Progress Notes (Deleted)
Spoke to patient's wife and he was recently at [facility] for HE and N/V and was there for 6 days. She states his MELD was 28 on admission and on discharge was either 23 or 25.   She has ran out of lactulose with only able to give one more dose today. She has been giving him 40-45 cc about 4-6 times per day to have 3-4 bowel movements daily. He sometimes does vomit up doses and at times may miss a dose or 2 due to not wanting to take it. She would like refills and has requested these before and does not know where the pharmacy is sending the requests to. She wants to make sure he does not miss a dose tonight or tomorrow prior to his appointment.   I notified her that I am refilling the medication today with extra refills and we will discuss everything else further tomorrow at his follow up.   Venetia Night, MSN, APRN, FNP-BC, AGACNP-BC [facility]

## 2022-09-11 ENCOUNTER — Other Ambulatory Visit: Payer: Self-pay

## 2022-09-11 ENCOUNTER — Telehealth: Payer: Self-pay

## 2022-09-11 ENCOUNTER — Ambulatory Visit (INDEPENDENT_AMBULATORY_CARE_PROVIDER_SITE_OTHER): Payer: Managed Care, Other (non HMO) | Admitting: Gastroenterology

## 2022-09-11 ENCOUNTER — Other Ambulatory Visit: Payer: Self-pay | Admitting: Gastroenterology

## 2022-09-11 ENCOUNTER — Encounter: Payer: Self-pay | Admitting: Gastroenterology

## 2022-09-11 VITALS — BP 94/62 | HR 82 | Temp 97.9°F | Ht 72.0 in | Wt 234.6 lb

## 2022-09-11 DIAGNOSIS — K7682 Hepatic encephalopathy: Secondary | ICD-10-CM

## 2022-09-11 DIAGNOSIS — N179 Acute kidney failure, unspecified: Secondary | ICD-10-CM

## 2022-09-11 DIAGNOSIS — K7031 Alcoholic cirrhosis of liver with ascites: Secondary | ICD-10-CM | POA: Diagnosis not present

## 2022-09-11 DIAGNOSIS — K219 Gastro-esophageal reflux disease without esophagitis: Secondary | ICD-10-CM

## 2022-09-11 DIAGNOSIS — R112 Nausea with vomiting, unspecified: Secondary | ICD-10-CM

## 2022-09-11 MED ORDER — PANTOPRAZOLE SODIUM 40 MG PO TBEC: 40.0000 mg | DELAYED_RELEASE_TABLET | Freq: Two times a day (BID) | ORAL | 3 refills | Status: DC

## 2022-09-11 MED ORDER — ONDANSETRON HCL 8 MG PO TABS: 8.0000 mg | ORAL_TABLET | Freq: Three times a day (TID) | ORAL | 1 refills | Status: DC | PRN

## 2022-09-11 MED ORDER — ALLOPURINOL 100 MG PO TABS: 100.0000 mg | ORAL_TABLET | Freq: Every day | ORAL | 1 refills | Status: DC

## 2022-09-11 NOTE — Progress Notes (Unsigned)
GI Office Note    Referring Provider: [name], MD Primary Care Physician:  [name], MD Primary Gastroenterologist: Dr. Gala Romney  Date:  09/11/2022  ID:  Bradley Miles, DOB 20-May-1966, MRN [id number]   Chief Complaint   Chief Complaint  Patient presents with   Follow-up     History of Present Illness  Bradley Miles is a 56 y.o. male with a history of alcoholic cirrhosis with ascites, portal hypertension, recurrent bleeding secondary to gastric varices requiring placement of coils, history of multiple TIPS procedures that have been unsuccessful due to occlusion, diabetes, anxiety, depression, GERD, HTN presenting today for follow-up post recent hospitalization at outside hospital.  Had office in October 2022 where he reported failed attempt at inpatient rehab for 45 days and reportedly was going to [employer], having 2-3 bowel movements per day with no encephalopathy, maintained on lactulose and Xifaxan.  He was due to follow-up TIPS procedure, ultrasound surveillance, and labs performed.  He did not follow-up, however he went to a treatment facility in [place] in January 2023 for alcohol cessation.  MRI liver in March 2022 due to elevated LFT of 12.5: No focal liver lesions hospital megaly, cholelithiasis, cirrhotic liver.  Tubes present in the right hepatic vein, evidence of left upper quadrant cooling with gastroesophageal varices.  Colonoscopy July 2022: Diverticulosis in the entire examined colon, colon lipoma, ten 3-8 mm polyp in the sigmoid and descending colon consistent with tubular adenomas.  Advised repeat in 3 years. (2025)  EGD December 2021: Normal esophagus, large volume of clotted blood and red blood in the cardia, gastric fundus and in the proximal gastric body that could not be cleared due to volume rectal bleeding versus clots, normal duodenum.  Source of bleeding not located.  Admitted to [facility] system in [place] in July  2023  Ultrasound revealing no flow detected within the TIPS shunt, cholelithiasis with gallbladder wall thickening which may be related to presence of ascites.  Cannot paracentesis emotions 30/23 with 4 L of clear yellow fluid evacuated, repeat paracentesis on 01-11-2022 with 6.3 L clear yellow fluid evacuated.  New TIPS obtained on 01/12/2022.  Within days of his repeat test procedure was ultimately found to be unsuccessful as he was noted to have poor flow through the shock and was given instructions to establish with hepatology for liver transplant evaluation.  Mel[facility]/17 that [facility] he had multiple recurrent admissions for ascites encephalopathy.  He had had 4 repeat paracentesis since February where he had approximately 2 L removed and no evidence of SBP.  He was taking lactulose and Xifaxan and reported 2-2 bowel movements daily.  He was to be set up with as needed paracentesis, stated to only remove 4 L at a time.  He was referred to IR regarding his TIPS and referred for transplant evaluation.  He is advised to continue nadolol, rifaximin, lactulose, follow 2 g sodium diet.  Office visit 04/16/2022 at [facility].  Scheduled for weekly paracentesis without any SBP.  Had improvement, lower extremity edema on Lasix.  Ultrasound in April 2023 without evidence of hepatoma.  AFP 7.8 on 02/16/2022.  Telemedicine visit with IR on 04/17/2022 with a recommending triple phase liver CT and labs and to follow with IR in 1 month.  05/14/2022: Visit with [facility] hepatology noting negative hepatocellular carcinoma screening on recent triphase liver CT.  He is also not shown to have gastric varices with coils present, extensive thrombus of clips,  moderate to large ascites, splenomegaly, and stool throughout the colon noted as well.  Hemoglobin 17.  He was advised to start liver transplant evaluation and referral to IR to consider PVR, EGD to follow-up on esophageal gastric varices, hep B  vaccination and advised on smoking cessation.  He is also advised to have albumin given with paracentesis.  Admitted to [facility] from 8/22 - 8/25 for acute hepatic encephalopathy.  His MELD 3.0 score was 22, MELD sodium score 21, Child-Pugh C.  He underwent paracentesis with removal of 6.3 L, negative for SBP.  This was likely due to not taking lactulose given his long car at home and not wanting to stop taking have bowel movements.  His creatinine was 2.5 on admission and improved on discharge.  He is advised to stop his Januvia instructed to continue spironolactone.  He was given omeprazole and no instruction as to when to restart Lasix.  At office visit on 08/07/2022 he reported no confusion or fogginess since returning home from [place].  Did admit to loss of fatigue.  He was not taking his Lasix and was taking about 45 mils of lactulose 4-5 times a day to have 3/4 bowel movements per day.  He also was no longer taking nadolol.  He also reported a lot of weight loss over the prior 6 months as he was living a bachelor lifestyle while treatment facility.  Also not eating a healthy low-sodium diet as he was loading up for frozen foods.  He was admitted to [facility] 08/16/2022 - 08/20/2022 for altered mental status that occurred after paracentesis.  He also had acute kidney injury with creatinine 2.8 on admission.  This was suspected to be due to diabetic therapy and large volume paracentesis.  MELD was 26.  During his hospital stay he became more obtunded and he had drop of hemoglobin.  He was on my schedule for EGD to further evaluate his drop in hemoglobin and encephalopathy.  He was started on zinc oxide and given lactulose enemas, he is unable to take p.o.  His EGD revealed grade 2 esophageal varices with presence of 2 prior scars concerning for prior bleeding, more of portal gastropathy, normal duodenum.  It was recommended for him to start carvedilol in the near future if  his MAP was greater than 65.  His lactulose and ceftriaxone restarted.  Nadolol was on hold due to poor renal function.  Due to issues with insurance, [facility] was unable to accept him for transplant evaluation.  Patient and his wife prefer to not pursue evaluation at [facility], and requested referral to be sent to [facility].  He was admitted to [facility] 08/28/22-09/05/2022 for nausea and vomiting, hepatic encephalopathy, and acute kidney injury.  Per review of discharge summary from [facility] it appears that his meld while hospitalized was between 61 and 24.  He had paracentesis performed on 9/20 with removal of 60 mL of fluid.  His home diuretics of Bumex 1 mg and spironolactone 100 mg daily were held throughout his hospitalization due to AKI.  Transplant work-up was initiated including labs.  MRI performed noting findings suggestive of longstanding occlusion of the main portal vein, SMV, splenic vein.  TIPS device is present with no definitive flow identified.  Possible small caliber patent left portal vein entering the liver with abdominal collateral vessels present.  TTE was ordered but not performed prior to discharge.  Urine electrolytes performed while inpatient noting prerenal kidney injury.  Continued fluid challenge with albumin  that ruled out hepatorenal syndrome.  Creatinine on admission was 3.33.  Creatinine on hospital discharge 9/11 was 1.86.  He did have positive UA with ED for E. faecalis he was treated with antibiotics, initially ceftriaxone but then transition to Augmentin.  Allopurinol was held for AKI but restarted on discharge.  He was advised to continue multivitamin, thiamine, and folic acid.  Should have follow-up with transplant hepatology clinic.  Creatinine 2.11 on discharge.  Advised to hold Bumex and spironolactone and get repeat BMP at next appointment and restart diuretics once AKI resolved.   Today: No confusion or fogginess today. Taking lactulose 4-6 times per day, having 3-4 bowel movements today. No  energy. Sleeping a lot of the day every day. Does have depression. Weight is up and down (weight fluctuates up and down about 8 pounds). Last para centesis was this past thursday. No recent fevers/chills. Has been out of the hospital for 6 days now. Had a vomiting episode this morning, typical yellow bile color. He was taken off all his diuretics and potassium supplement due to AKI (she states he was taking lasix, bumex, and spironolactone). Want to prolong time between paracentesis. Hospital requested labs to be performed. They started him back on nadalol. - has been on 40 since you didn't have 20 mg pills. Not adhering to low sodium diet.   MRI was done at [facility] and confirmed both TIPs were clotted. Has collaterals and other occluded vessels as well.   Has appointment 10/9 to see hepatology at [facility].   Seeing Marland Kitchen at [place] now.  Current Outpatient Medications  Medication Sig Dispense Refill   blood glucose meter kit and supplies Dispense based on patient and insurance preference. Test blood sugar once daily.  (FOR ICD-10 E10.9, E11.9). 1 each 5   folic acid (FOLVITE) 1 MG tablet Take 1 mg by mouth daily.     gabapentin (NEURONTIN) 600 MG tablet Take 600 mg by mouth every 8 (eight) hours.     lactulose (CHRONULAC) 10 GM/15ML solution Take 45 mLs (30 g total) by mouth every 6 (six) hours. Titrate as needed to have 2-4 bowel movements daily. 946 mL 8   ondansetron (ZOFRAN) 4 MG tablet Take 1 tablet (4 mg total) by mouth every 6 (six) hours as needed for nausea. 20 tablet 0   pantoprazole (PROTONIX) 40 MG tablet Take 1 tablet (40 mg total) by mouth 2 (two) times daily before a meal. 180 tablet 3   PARoxetine (PAXIL) 40 MG tablet Take 1 tablet (40 mg total) by mouth daily. 90 tablet 1   XIFAXAN 550 MG TABS tablet TAKE 1 TABLET(550 MG) BY MOUTH TWICE DAILY 60 tablet 11   allopurinol (ZYLOPRIM) 100 MG tablet Take 1 tablet (100 mg total) by mouth daily. 90 tablet 1   bumetanide  (BUMEX) 1 MG tablet Take 1 mg by mouth daily. (Patient not taking: Reported on 09/11/2022)     glucose blood (TRUE METRIX BLOOD GLUCOSE TEST) test strip Use as instructed (Patient not taking: Reported on 08/17/2022) 100 each 12   Insulin Pen Needle (NOVOFINE) 30G X 8 MM MISC Inject 10 each into the skin as needed. (Patient not taking: Reported on 08/17/2022) 90 each 1   Lancets (ACTI-LANCE 28G) MISC Use 3x per day as directed. (Patient not taking: Reported on 08/17/2022) 200 each 3   potassium chloride SA (KLOR-CON) 20 MEQ tablet Take 1 tablet (20 mEq total) by mouth daily as needed (Take when taking Lasix). (Patient not  taking: Reported on 09/11/2022) 30 tablet 11   spironolactone (ALDACTONE) 100 MG tablet TAKE 1 TABLET(100 MG) BY MOUTH DAILY (Patient not taking: Reported on 09/11/2022) 30 tablet 5   sucralfate (CARAFATE) 1 g tablet TAKE 1 TABLET BY MOUTH BEFORE MEALS (Patient not taking: Reported on 09/11/2022) 90 tablet 0   No current facility-administered medications for this visit.    Past Medical History:  Diagnosis Date   Alcoholic cirrhosis ([place])    Anxiety    Controlled type 2 diabetes mellitus with complication, without long-term current use of insulin ([place]) 06/26/2020   Enlarged liver    GERD (gastroesophageal reflux disease)    Hypertension    Palpitations    PVC's (premature ventricular contractions)    Shortness of breath    Tussive syncope 12/22/2019   Varicose veins     Past Surgical History:  Procedure Laterality Date   BIOPSY  02/02/2019   Procedure: BIOPSY;  Surgeon: Daneil Dolin, MD;  [facility];  Service: Endoscopy;;  gastric   COLONOSCOPY WITH ESOPHAGOGASTRODUODENOSCOPY (EGD)  12/16/2012   internal hemorrhoids, colonic diverticulosis, benign polyps, screening in 2024. EGD with mild erosive reflux esophagitis, small hiatal hernia, negative H.pylori   COLONOSCOPY WITH PROPOFOL N/A 06/22/2021   Procedure: COLONOSCOPY WITH PROPOFOL;  Surgeon: Daneil Dolin, MD;   [facility];  Service: Endoscopy;  Laterality: N/A;  1:45pm   cyst reomved     from spine   ESOPHAGOGASTRODUODENOSCOPY  05/19/09   RMR: Geographic distal esophageal erosions consistent with severe erosive reflux esophagitis. schatzki's ring s/P dilation/small hiatal hernia otherwise normal stomach   ESOPHAGOGASTRODUODENOSCOPY (EGD) WITH PROPOFOL N/A 02/02/2019   Dr. Gala Romney: retained gastric contents. portal HTN gastropathy   ESOPHAGOGASTRODUODENOSCOPY (EGD) WITH PROPOFOL N/A 11/20/2019   Procedure: ESOPHAGOGASTRODUODENOSCOPY (EGD) WITH PROPOFOL;  Surgeon: Daneil Dolin, MD;  [facility];  Service: Endoscopy;  Laterality: N/A;   ESOPHAGOGASTRODUODENOSCOPY (EGD) WITH PROPOFOL N/A 11/24/2019   Procedure: ESOPHAGOGASTRODUODENOSCOPY (EGD) WITH PROPOFOL;  Surgeon: Ronnette Juniper, MD;  Location: [place];  Service: Gastroenterology;  Laterality: N/A;   ESOPHAGOGASTRODUODENOSCOPY (EGD) WITH PROPOFOL N/A 08/08/2020   Procedure: ESOPHAGOGASTRODUODENOSCOPY (EGD) WITH PROPOFOL;  Surgeon: Daneil Dolin, MD;  [facility];  Service: Endoscopy;  Laterality: N/A;   ESOPHAGOGASTRODUODENOSCOPY (EGD) WITH PROPOFOL N/A 11/15/2020   Procedure: ESOPHAGOGASTRODUODENOSCOPY (EGD) WITH PROPOFOL;  Surgeon: Ladene Artist, MD;  Location: [facility];  Service: Endoscopy;  Laterality: N/A;   ESOPHAGOGASTRODUODENOSCOPY (EGD) WITH PROPOFOL N/A 08/19/2022   Procedure: ESOPHAGOGASTRODUODENOSCOPY (EGD) WITH PROPOFOL;  Surgeon: Harvel Quale, MD;  [facility];  Service: Gastroenterology;  Laterality: N/A;   IR ANGIOGRAM SELECTIVE EACH ADDITIONAL VESSEL  11/22/2019   IR ANGIOGRAM SELECTIVE EACH ADDITIONAL VESSEL  11/24/2019   IR ANGIOGRAM SELECTIVE EACH ADDITIONAL VESSEL  11/24/2019   IR ANGIOGRAM SELECTIVE EACH ADDITIONAL VESSEL  11/24/2019   IR ANGIOGRAM SELECTIVE EACH ADDITIONAL VESSEL  11/15/2020   IR EMBO ART  VEN HEMORR LYMPH EXTRAV  INC GUIDE ROADMAPPING   11/22/2019   IR EMBO ART  VEN HEMORR LYMPH EXTRAV  INC GUIDE ROADMAPPING  11/24/2019   IR EMBO ART  VEN HEMORR LYMPH EXTRAV  INC GUIDE ROADMAPPING  11/15/2020   IR FLUORO GUIDE CV LINE RIGHT  11/15/2020   IR RADIOLOGIST EVAL & MGMT  12/29/2019   IR RADIOLOGIST EVAL & MGMT  10/27/2020   IR RADIOLOGIST EVAL & MGMT  04/19/2021   IR TIPS  11/24/2019   IR US GUIDE VASC ACCESS  RIGHT  11/22/2019   IR US GUIDE VASC ACCESS RIGHT  11/15/2020   IR US GUIDE VASC ACCESS RIGHT  11/15/2020   IR VENOGRAM RENAL UNI LEFT  11/22/2019   IR VENOGRAM RENAL UNI LEFT  11/15/2020   POLYPECTOMY  06/22/2021   Procedure: POLYPECTOMY;  Surgeon: Daneil Dolin, MD;  [facility];  Service: Endoscopy;;   RADIOLOGY WITH ANESTHESIA N/A 11/24/2019   Procedure: IR WITH ANESTHESIA;  Surgeon: Radiologist, Medication, MD;  Location: [place];  Service: Radiology;  Laterality: N/A;   RADIOLOGY WITH ANESTHESIA N/A 11/15/2020   Procedure: IR WITH ANESTHESIA;  Surgeon: Suzette Battiest, MD;  Location: [place];  Service: Radiology;  Laterality: N/A;   TIPS PROCEDURE N/A 11/22/2019   Procedure: BRTO/TRANS-JUGULAR INTRAHEPATIC PORTAL SHUNT (TIPS);  Surgeon: Corrie Mckusick, DO;  Location: [place];  Service: Anesthesiology;  Laterality: N/A;   WISDOM TOOTH EXTRACTION      Family History  Problem Relation Age of Onset   Cancer Mother        ? 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   Right-handed.   No daily use of caffeine.   Social Determinants of Health   Financial Resource Strain: Not on file  Food Insecurity: Not on file  Transportation Needs: Not on file  Physical Activity: Not on file  Stress: Not on file  Social Connections: Not on file     Review of Systems   Gen: Denies fever, chills, anorexia. Denies fatigue, weakness, weight loss.  CV: Denies chest pain, palpitations, syncope, peripheral edema, and claudication. Resp: Denies dyspnea at rest, cough, wheezing, coughing up blood, and pleurisy. GI: See HPI Derm: Denies rash, itching, dry skin Psych: Denies depression, anxiety, memory loss, confusion. No homicidal or suicidal ideation.  Heme: Denies bruising, bleeding, and enlarged lymph nodes.   Physical Exam   BP 94/62 (BP Location: Right Arm, Patient Position: Sitting, Cuff Size: Normal)   Pulse 82   Temp 97.9 F (36.6 C) (Temporal)   Ht 6' (1.829 m)   Wt 234 lb 9.6 oz (106.4 kg)   SpO2 100%   BMI 31.82 kg/m   General:   Alert and oriented. No distress noted.  Mild jaundice. Head:  Normocephalic and atraumatic.  Eyes:  Mild scleral icterus.  Mouth:  Oral mucosa pink and moist. Good dentition. No lesions. Lungs:  Clear to auscultation bilaterally. No wheezes, rales, or rhonchi. No distress.  Heart:  S1, S2 present without murmurs appreciated.  Abdomen:  +BS, soft, mildly distended.  No tenderness.  No rebound or guarding. No HSM or masses noted. Rectal: deferred Msk:  Symmetrical without gross deformities. Normal  posture. Extremities:  Without edema. Neurologic:  Alert and oriented x4 Psych:  Alert and cooperative. Flat affect.    Assessment  Bradley Miles is a 56 y.o. male with a history of alcoholic cirrhosis with ascites, portal hypertension, recurrent bleeding secondary to gastric varices requiring placement of coils, history of multiple TIPS procedures that have been unsuccessful due to occlusion, diabetes, anxiety, depression, GERD, HTN presenting today for follow-up post recent hospitalization at outside hospital.  Decompensated Alcoholic Cirrhosis: MELD 3.0 was 24 on day of discharge on 08/20/2022.  His MELD ranged between 19 and 24 during most recent hospitalization at [facility] in which she was discharged on 09/05/2022.  History of recurrent GI bleeding due to esophageal and gastric varices s/p coils and multiple TIPS procedures for recurrent ascites.  Recent EGD completed 08/19/2022 with grade 2 esophageal varices in the presence of 2 scars concerning for prior bleeding, moderate portal gastropathy, normal duodenum.  Patient spent about 7 months in inpatient treatment facility for alcohol cessation in [place], returning the end of August.  Was seen during multiple hospitalizations in [place] for recurrent ascites requiring paracenteses.  AFP in May 2023 was 7.2.  During his time in [place] he was maintained on lactulose and Xifaxan.  He was not adhering to a 2 g sodium diet while in [place].  He was noted to have lower extremity edema and had been on Bumex 1 mg daily and spironolactone 100 mg daily.  He was to begin transplant work-up in [place] but upon returning home, needed referral for he was unable to be accepted at [facility] for transplant work-up, ultimately has been referred to [facility] and transplant work-up started at most recent hospitalization, will have outpatient follow-up on 09/17/2022 with transplant hepatology per patient's wife.  Since his last office visit he has had multiple hospitalizations for hepatic  encephalopathy and AKI with most recent stay at [facility] from 9/19 - 9/27.  This was likely exacerbated by large-volume paracenteses, currently has orders for as needed paracenteses with orders to not remove more than 4 L, and he is to receive albumin which each paracentesis.  Currently given repeat AKI, his home diuretics are on hold.  He again today admits to not following a 2 g sodium diet.  Most recent MRI noting findings suggestive of longstanding occlusion of the main portal vein, SMV, splenic vein.  TIPS device is present with no definitive flow identified.  Possible small caliber patent left portal vein entering the liver with abdominal collateral vessels present.  Nadolol was also on hold during recent admission and was advised to restart at half his prior dose given patient was unable to obtain 20 mg tablets, he has been taking his home 40 mg tablets.  Advised to stop nadolol at current dose given lower systolic blood pressure and significant fatigue at home.  Will restart low-dose carvedilol 3.125 mg twice daily and advised more frequent monitoring of blood pressure at home.  May need to hold this as well until AKI resolves.  He should continue his cephalexin and lactulose as prescribed.  GERD, N/V: Continues to have intermittent  reflux symptoms with omeprazole.  At last office visit, this was increased to twice daily dosing which insurance denied.  Pantoprazole 40 mg was approved.  Advised to start pantoprazole 40 mg twice daily.  He has continued to experience nausea with intermittent vomiting.  This is likely exacerbated by his ascites, cirrhosis, and failed TIPS procedures.  Previously was taking Zofran 4 mg as needed, but stopped taking due to feeling like it was not helpful.  We will increase Zofran to 8 mg every 8 hours as needed for nausea.  Diet discussed today including eating smaller more frequent meals rather than fewer larger meals.  Recent AKI: Elevated creatinine noted on discharge at [facility] in early September.  His creatinine was 1.86 on discharge 9/11.  During most recent admission 9/19 at [facility], his creatinine was 3.33 on presentation.  He was given albumin challenge which ruled out hepatorenal syndrome, urine electrolytes revealed prerenal cause of AKI.  He also had a UTI and was treated with Augmentin.  His home diuretics were held.  His creatinine was 2.11 on discharge 09/05/2022.  He has continued to hold his diuretics.  We will repeat electrolytes to assess renal function to determine if we are able to restart diuretics.  He has been maintained on nadolol, although was advised to decrease this to 20 mg from 40 mg but given they are not able to obtain 20 mg tablets, he had been taking 40 mg at home.  Last seen by our service in early September, it was recommended that he start carvedilol low-dose if blood pressures will tolerate.  Advised to stop nadolol 40 mg daily and start carvedilol 3.125 mg twice daily.  Today he had a lower systolic blood pressure, but decreasing the amount of beta-blocker, this may improve his blood pressure, and turn possibly improving AKI.  PLAN   Smaller more frequent meals Paracentesis as needed, goal to space them out at least to every other week, removing no more than 4 L at a time. Albumin 25% 25g with each paracentesis.  Zofran 8 mg every 8 hours as needed.  Continue lactulose 61m every 6 hours and titrate for 3-4 bowel movements daily.  Continue Xifaxin 550 mg BID.  Stop nadolol and start Carvedilol 3.125 twice daily for variceal prophylaxis.  Continue to hold home diuretics. Pantoprazole 40 mg BID.  Smoking cessation Continue alcohol abstinence CBC, CMP, PT/INR, AFP Follow 2 g sodium diet Consider restarting diuretics if AKI resolved. Advised patient and wife to discuss allopurinol refills with PCP Follow-up in about 6 to 8 weeks with Dr. RDerenda Mis MSN, FNP-BC, AGACNP-BC [facility]

## 2022-09-11 NOTE — Patient Instructions (Addendum)
Is important that you continue your follow-up for your depression, there are resources available in the area for counseling and ongoing support for alcohol abstinence.    For your reflux and nausea, I want you to continue pantoprazole 40mg  BID. I am increasing your dose of Zofran to 8mg  to take every 8 hours as needed.  It may also be a good idea to eat more frequent smaller meals throughout the day rather than eating 3 larger meals.   I want you to follow 2 g sodium diet.  I will attach a handout for you.   Please continue to take your lactulose to titrate for 3-4 bowel movements daily, please let me know when you need refills.  Also want you to continue your Xifaxan.  I want you to stop the nadolol.  Beginning tomorrow morning please start carvedilol 3.125 mg twice daily and continue to monitor your blood pressure.  I am ordering labs for you have completed since your hospitalization to recheck your kidney function.  If your kidney function is improved, we will consider restarting your diuretics.   In order to maintain your weight and to help with your ascites, it would be beneficial for you to increase your protein intake.  I want you to accomplish this by drinking 1-2 protein shakes daily.  Good options include Ensure, boost, Premier protein, fair life core power, or other nutritious protein shakes.  Each shake should include at least a minimum of 25 g of protein.  Please monitor your weight at least weekly at home.     It was a pleasure to see you today. I want to create trusting relationships with patients. If you receive a survey regarding your visit,  I greatly appreciate you taking time to fill this out on paper or through your MyChart. I value your feedback.  Venetia Night, MSN, FNP-BC, AGACNP-BC [facility]

## 2022-09-11 NOTE — Telephone Encounter (Signed)
refilled 

## 2022-09-11 NOTE — Telephone Encounter (Signed)
Patient need med refill if possible before his next appt 09/20/2022   allopurinol (ZYLOPRIM) 100 MG tablet  Pharmacy: [facility] [address] Linna Hoff

## 2022-09-12 LAB — CBC/DIFF AMBIGUOUS DEFAULT
Basophils Absolute: 0 10*3/uL (ref 0.0–0.2)
Basos: 1 %
EOS (ABSOLUTE): 0.2 10*3/uL (ref 0.0–0.4)
Eos: 3 %
Hematocrit: 27.6 % — ABNORMAL LOW (ref 37.5–51.0)
Hemoglobin: 9.2 g/dL — ABNORMAL LOW (ref 13.0–17.7)
Immature Grans (Abs): 0 10*3/uL (ref 0.0–0.1)
Immature Granulocytes: 0 %
Lymphocytes Absolute: 0.3 10*3/uL — ABNORMAL LOW (ref 0.7–3.1)
Lymphs: 6 %
MCH: 36.1 pg — ABNORMAL HIGH (ref 26.6–33.0)
MCHC: 33.3 g/dL (ref 31.5–35.7)
MCV: 108 fL — ABNORMAL HIGH (ref 79–97)
Monocytes Absolute: 0.8 10*3/uL (ref 0.1–0.9)
Monocytes: 15 %
Neutrophils Absolute: 3.8 10*3/uL (ref 1.4–7.0)
Neutrophils: 75 %
Platelets: 106 10*3/uL — ABNORMAL LOW (ref 150–450)
RBC: 2.55 x10E6/uL — CL (ref 4.14–5.80)
RDW: 13.4 % (ref 11.6–15.4)
WBC: 5.1 10*3/uL (ref 3.4–10.8)

## 2022-09-12 LAB — COMPREHENSIVE METABOLIC PANEL
ALT: 26 IU/L (ref 0–44)
AST: 41 IU/L — ABNORMAL HIGH (ref 0–40)
Albumin/Globulin Ratio: 1.6 (ref 1.2–2.2)
Albumin: 3.7 g/dL — ABNORMAL LOW (ref 3.8–4.9)
Alkaline Phosphatase: 166 IU/L — ABNORMAL HIGH (ref 44–121)
BUN/Creatinine Ratio: 19 (ref 9–20)
BUN: 35 mg/dL — ABNORMAL HIGH (ref 6–24)
Bilirubin Total: 1.1 mg/dL (ref 0.0–1.2)
CO2: 14 mmol/L — ABNORMAL LOW (ref 20–29)
Calcium: 8.9 mg/dL (ref 8.7–10.2)
Chloride: 107 mmol/L — ABNORMAL HIGH (ref 96–106)
Creatinine, Ser: 1.87 mg/dL — ABNORMAL HIGH (ref 0.76–1.27)
Globulin, Total: 2.3 g/dL (ref 1.5–4.5)
Glucose: 214 mg/dL — ABNORMAL HIGH (ref 70–99)
Potassium: 3.5 mmol/L (ref 3.5–5.2)
Sodium: 137 mmol/L (ref 134–144)
Total Protein: 6 g/dL (ref 6.0–8.5)
eGFR: 42 mL/min/{1.73_m2} — ABNORMAL LOW (ref >59)

## 2022-09-12 LAB — AFP TUMOR MARKER: AFP, Serum, Tumor Marker: 4.3 ng/mL (ref 0.0–8.4)

## 2022-09-12 LAB — CULTURE, BODY FLUID W GRAM STAIN -BOTTLE: Culture: NO GROWTH

## 2022-09-12 LAB — SPECIMEN STATUS REPORT

## 2022-09-12 LAB — PROTIME-INR
INR: 1.1 (ref 0.9–1.2)
Prothrombin Time: 11.9 s (ref 9.1–12.0)

## 2022-09-13 ENCOUNTER — Other Ambulatory Visit: Payer: Self-pay | Admitting: *Deleted

## 2022-09-13 DIAGNOSIS — K7031 Alcoholic cirrhosis of liver with ascites: Secondary | ICD-10-CM

## 2022-09-14 ENCOUNTER — Encounter (HOSPITAL_COMMUNITY): Payer: Self-pay

## 2022-09-14 ENCOUNTER — Ambulatory Visit (HOSPITAL_COMMUNITY)
Admission: RE | Admit: 2022-09-14 | Discharge: 2022-09-14 | Disposition: A | Discharge status: Home / Self Care | Payer: Managed Care, Other (non HMO) | Source: Ambulatory Visit | Attending: Gastroenterology | Admitting: Gastroenterology

## 2022-09-14 DIAGNOSIS — R188 Other ascites: Secondary | ICD-10-CM | POA: Insufficient documentation

## 2022-09-14 LAB — BODY FLUID CELL COUNT WITH DIFFERENTIAL
Eos, Fluid: 0 %
Lymphs, Fluid: 12 %
Monocyte-Macrophage-Serous Fluid: 86 % (ref 50–90)
Neutrophil Count, Fluid: 2 % (ref 0–25)
Total Nucleated Cell Count, Fluid: 119 cu mm (ref 0–1000)

## 2022-09-14 LAB — GRAM STAIN

## 2022-09-14 MED ORDER — ALBUMIN HUMAN 25 % IV SOLN
INTRAVENOUS | Status: AC
  Administered 2022-09-14: 25 g via INTRAVENOUS
  Filled 2022-09-14: qty 100

## 2022-09-14 MED ORDER — ALBUMIN HUMAN 25 % IV SOLN: 25.0000 g | Freq: Once | INTRAVENOUS | Status: AC

## 2022-09-14 NOTE — Procedures (Signed)
PreOperative Dx: Alcoholic cirrhosis, ascites Postoperative Dx: Alcoholic cirrhosis, ascites Procedure:   US guided paracentesis Radiologist:  Thornton Papas Anesthesia:  10 ml of 1% lidocaine Specimen:  4.2 L of slightly cloudy yellow ascitic fluid EBL:   < 1 ml Complications:  None

## 2022-09-14 NOTE — Progress Notes (Signed)
Paracentesis complete no signs of distress. 25 Grams IV Albumin given.

## 2022-09-17 ENCOUNTER — Ambulatory Visit: Payer: Managed Care, Other (non HMO) | Admitting: Gastroenterology

## 2022-09-18 LAB — CYTOLOGY - NON PAP

## 2022-09-19 ENCOUNTER — Other Ambulatory Visit (HOSPITAL_COMMUNITY): Payer: Self-pay | Admitting: Gastroenterology

## 2022-09-19 ENCOUNTER — Other Ambulatory Visit: Payer: Self-pay | Admitting: Gastroenterology

## 2022-09-19 DIAGNOSIS — R188 Other ascites: Secondary | ICD-10-CM

## 2022-09-19 LAB — CULTURE, BODY FLUID W GRAM STAIN -BOTTLE: Culture: NO GROWTH

## 2022-09-20 ENCOUNTER — Encounter: Payer: Self-pay | Admitting: Internal Medicine

## 2022-09-20 ENCOUNTER — Ambulatory Visit (INDEPENDENT_AMBULATORY_CARE_PROVIDER_SITE_OTHER): Payer: Managed Care, Other (non HMO) | Admitting: Internal Medicine

## 2022-09-20 VITALS — BP 108/71 | HR 100 | Ht 72.0 in | Wt 229.6 lb

## 2022-09-20 DIAGNOSIS — K7031 Alcoholic cirrhosis of liver with ascites: Secondary | ICD-10-CM | POA: Diagnosis not present

## 2022-09-20 DIAGNOSIS — Z23 Encounter for immunization: Secondary | ICD-10-CM | POA: Insufficient documentation

## 2022-09-20 DIAGNOSIS — E139 Other specified diabetes mellitus without complications: Secondary | ICD-10-CM

## 2022-09-20 DIAGNOSIS — F17200 Nicotine dependence, unspecified, uncomplicated: Secondary | ICD-10-CM | POA: Diagnosis not present

## 2022-09-20 NOTE — Assessment & Plan Note (Signed)
Presenting today for follow-up.  He has establish care with [facility] transplant center.  Transplant candidacy evaluation is ongoing.  They are awaiting a phone call, likely tomorrow, for neck steps.  In the interim, his medications been adjusted.  He is no longer taking Bumex/spironolactone.  He will undergo repeat therapeutic paracentesis tomorrow (10/13).  Positive fluid wave appreciated today on exam.  No jaundice or asterixis noted. -No medication changes today -He is awaiting a phone call from [place] transplant center for next steps

## 2022-09-20 NOTE — Assessment & Plan Note (Signed)
Influenza vaccine administered today.

## 2022-09-20 NOTE — Patient Instructions (Signed)
It was a pleasure to see you today.  Thank you for giving [place] the opportunity to be involved in your care.  Below is a brief recap of your visit and next steps.  We will plan to see you again in early December.  Summary We will check your hemoglobin A1c today and your urine for protein You will receive flu and shingles vaccines  Next steps Follow up in early December

## 2022-09-20 NOTE — Assessment & Plan Note (Signed)
His last HgbA1c was 8.7 in July 2020.  He is not currently on any medication for diabetes.  On recent blood work, his blood glucose level has consistently been 100-120. -Repeat A1c today -Diabetic foot exam completed today -Referral placed to ophthalmology for diabetic eye care

## 2022-09-20 NOTE — Progress Notes (Signed)
Established Patient Office Visit  Subjective   Patient ID: Bradley Miles, male    DOB: 1966/06/07  Age: 56 y.o. MRN: [id number]  Chief Complaint  Patient presents with   Follow-up   [name] returns to care today.  He is a 56 year old male with a past medical history significant for alcoholic cirrhosis complicated by esophageal varices, HE, and portal gastropathy, hypertension, COPD, GERD, T2DM, peripheral neuropathy, and anxiety/depression.  He was last seen at [facility] by me on 9/14 to establish care.  At that appointment, Bradley Miles wife reported that he had fallen multiple times within the previous 24 hours.  Asterixis was present on exam.  I recommended that he present to the emergency department for further evaluation after discussing his presenting symptoms with gastroenterology.  In the interim, he was admitted to [facility] 9/19 - 9/27 for nausea and vomiting, EGD, and AKI.  He is currently undergoing transplant candidacy evaluation at [facility].  Bradley Miles has been seen in follow-up by [facility]) since hospital discharge.  He last underwent therapeutic paracentesis on 10/6 and had 4.2 L of ascitic fluid removed.  Today Bradley Miles states that he feels fairly well, significantly better than when I last saw him in September.  He is accompanied by his wife today.  Bradley Miles has no acute concerns.  He has been in contact with the transplant team at [facility] and is currently awaiting neck steps for transplant candidacy evaluation.  His medication regimen has been adjusted.  He is no longer taking diuretics.  Gabapentin has been discontinued as well.  His last therapeutic paracentesis was 10/6 and he will undergo repeat paracentesis tomorrow (10/13).  He has no additional concerns to discuss today.  Chronic medical conditions and outstanding preventative healthcare maintenance items discussed today are individually addressed in A/P below.  Past Medical History:  Diagnosis Date   Alcoholic cirrhosis ([place])     Anxiety    Controlled type 2 diabetes mellitus with complication, without long-term current use of insulin ([place]) 06/26/2020   Enlarged liver    GERD (gastroesophageal reflux disease)    Hypertension    Palpitations    PVC's (premature ventricular contractions)    Shortness of breath    Tussive syncope 12/22/2019   Varicose veins    Past Surgical History:  Procedure Laterality Date   BIOPSY  02/02/2019   Procedure: BIOPSY;  Surgeon: Daneil Dolin, MD;  [facility];  Service: Endoscopy;;  gastric   COLONOSCOPY WITH ESOPHAGOGASTRODUODENOSCOPY (EGD)  12/16/2012   internal hemorrhoids, colonic diverticulosis, benign polyps, screening in 2024. EGD with mild erosive reflux esophagitis, small hiatal hernia, negative H.pylori   COLONOSCOPY WITH PROPOFOL N/A 06/22/2021   Procedure: COLONOSCOPY WITH PROPOFOL;  Surgeon: Daneil Dolin, MD;  [facility];  Service: Endoscopy;  Laterality: N/A;  1:45pm   cyst reomved     from spine   ESOPHAGOGASTRODUODENOSCOPY  05/19/09   RMR: Geographic distal esophageal erosions consistent with severe erosive reflux esophagitis. schatzki's ring s/P dilation/small hiatal hernia otherwise normal stomach   ESOPHAGOGASTRODUODENOSCOPY (EGD) WITH PROPOFOL N/A 02/02/2019   Dr. Gala Romney: retained gastric contents. portal HTN gastropathy   ESOPHAGOGASTRODUODENOSCOPY (EGD) WITH PROPOFOL N/A 11/20/2019   Procedure: ESOPHAGOGASTRODUODENOSCOPY (EGD) WITH PROPOFOL;  Surgeon: Daneil Dolin, MD;  [facility];  Service: Endoscopy;  Laterality: N/A;   ESOPHAGOGASTRODUODENOSCOPY (EGD) WITH PROPOFOL N/A 11/24/2019   Procedure: ESOPHAGOGASTRODUODENOSCOPY (EGD) WITH PROPOFOL;  Surgeon: Ronnette Juniper, MD;  Location: [place];  Service:  Gastroenterology;  Laterality: N/A;   ESOPHAGOGASTRODUODENOSCOPY (EGD) WITH PROPOFOL N/A 08/08/2020   Procedure: ESOPHAGOGASTRODUODENOSCOPY (EGD) WITH PROPOFOL;  Surgeon: Daneil Dolin, MD;  [facility];   Service: Endoscopy;  Laterality: N/A;   ESOPHAGOGASTRODUODENOSCOPY (EGD) WITH PROPOFOL N/A 11/15/2020   Procedure: ESOPHAGOGASTRODUODENOSCOPY (EGD) WITH PROPOFOL;  Surgeon: Ladene Artist, MD;  Location: [facility] ENDOSCOPY;  Service: Endoscopy;  Laterality: N/A;   ESOPHAGOGASTRODUODENOSCOPY (EGD) WITH PROPOFOL N/A 08/19/2022   Procedure: ESOPHAGOGASTRODUODENOSCOPY (EGD) WITH PROPOFOL;  Surgeon: Harvel Quale, MD;  [facility];  Service: Gastroenterology;  Laterality: N/A;   IR ANGIOGRAM SELECTIVE EACH ADDITIONAL VESSEL  11/22/2019   IR ANGIOGRAM SELECTIVE EACH ADDITIONAL VESSEL  11/24/2019   IR ANGIOGRAM SELECTIVE EACH ADDITIONAL VESSEL  11/24/2019   IR ANGIOGRAM SELECTIVE EACH ADDITIONAL VESSEL  11/24/2019   IR ANGIOGRAM SELECTIVE EACH ADDITIONAL VESSEL  11/15/2020   IR EMBO ART  VEN HEMORR LYMPH EXTRAV  INC GUIDE ROADMAPPING  11/22/2019   IR EMBO ART  VEN HEMORR LYMPH EXTRAV  INC GUIDE ROADMAPPING  11/24/2019   IR EMBO ART  VEN HEMORR LYMPH EXTRAV  INC GUIDE ROADMAPPING  11/15/2020   IR FLUORO GUIDE CV LINE RIGHT  11/15/2020   IR RADIOLOGIST EVAL & MGMT  12/29/2019   IR RADIOLOGIST EVAL & MGMT  10/27/2020   IR RADIOLOGIST EVAL & MGMT  04/19/2021   IR TIPS  11/24/2019   IR US GUIDE VASC ACCESS RIGHT  11/22/2019   IR US GUIDE VASC ACCESS RIGHT  11/15/2020   IR US GUIDE VASC ACCESS RIGHT  11/15/2020   IR VENOGRAM RENAL UNI LEFT  11/22/2019   IR VENOGRAM RENAL UNI LEFT  11/15/2020   POLYPECTOMY  06/22/2021   Procedure: POLYPECTOMY;  Surgeon: Daneil Dolin, MD;  [facility];  Service: Endoscopy;;   RADIOLOGY WITH ANESTHESIA N/A 11/24/2019   Procedure: IR WITH ANESTHESIA;  Surgeon: Radiologist, Medication, MD;  Location: [place];  Service: Radiology;  Laterality: N/A;   RADIOLOGY WITH ANESTHESIA N/A 11/15/2020   Procedure: IR WITH ANESTHESIA;  Surgeon: Suzette Battiest, MD;  Location: [place];  Service: Radiology;  Laterality: N/A;   TIPS PROCEDURE N/A 11/22/2019    Procedure: BRTO/TRANS-JUGULAR INTRAHEPATIC PORTAL SHUNT (TIPS);  Surgeon: Corrie Mckusick, DO;  Location: [place];  Service: Anesthesiology;  Laterality: N/A;   WISDOM TOOTH EXTRACTION     Social History   Tobacco Use   Smoking status: Every Day    Packs/day: 1.00    Years: 30.00    Total pack years: 30.00    Types: Cigarettes    Start date: 12/24/1979   Smokeless tobacco: Never  Vaping Use   Vaping Use: Never used  Substance Use Topics   Alcohol use: Yes    Alcohol/week: 0.0 standard drinks of alcohol    Comment: 05/09/21 "6 coolers/day"   Drug use: No   Family History  Problem Relation Age of Onset   Cancer Mother        ? etiology   Heart disease Mother    Colon polyps Sister 51       two sisters   Brain cancer Father    Liver disease Neg Hx    No Known Allergies  Review of Systems  Constitutional:  Positive for malaise/fatigue. Negative for chills and fever.  HENT:  Negative for sore throat.   Respiratory:  Negative for cough and shortness of breath.   Cardiovascular:  Negative for chest pain, palpitations and leg swelling.  Gastrointestinal:  Negative for abdominal pain, blood in stool, constipation, diarrhea, nausea and vomiting.  Genitourinary:  Negative for dysuria and hematuria.  Musculoskeletal:  Negative for myalgias.  Skin:  Negative for itching and rash.  Neurological:  Negative for dizziness and headaches.  Psychiatric/Behavioral:  Negative for depression and suicidal ideas.      Objective:     BP 108/71   Pulse 100   Ht 6' (1.829 m)   Wt 229 lb 9.6 oz (104.1 kg)   SpO2 98%   BMI 31.14 kg/m  BP Readings from Last 3 Encounters:  09/20/22 108/71  09/14/22 99/60  09/11/22 94/62   Physical Exam Vitals reviewed.  Constitutional:      General: He is not in acute distress.    Appearance: Normal appearance. He is not ill-appearing.  HENT:     Head: Normocephalic and atraumatic.     Nose: Nose normal. No congestion or rhinorrhea.     Mouth/Throat:      Mouth: Mucous membranes are moist.     Pharynx: Oropharynx is clear.  Eyes:     Extraocular Movements: Extraocular movements intact.     Conjunctiva/sclera: Conjunctivae normal.     Pupils: Pupils are equal, round, and reactive to light.  Cardiovascular:     Rate and Rhythm: Normal rate and regular rhythm.     Pulses: Normal pulses.     Heart sounds: Normal heart sounds. No murmur heard. Pulmonary:     Effort: Pulmonary effort is normal.     Breath sounds: Normal breath sounds. No wheezing, rhonchi or rales.  Abdominal:     General: Abdomen is flat. Bowel sounds are normal. There is no distension.     Palpations: Abdomen is soft.     Tenderness: There is no abdominal tenderness.     Comments: Positive fluid wave  Musculoskeletal:        General: No swelling or deformity. Normal range of motion.     Cervical back: Normal range of motion.  Skin:    General: Skin is warm and dry.     Capillary Refill: Capillary refill takes less than 2 seconds.     Coloration: Skin is not jaundiced.  Neurological:     General: No focal deficit present.     Mental Status: He is alert and oriented to person, place, and time.     Motor: No weakness.  Psychiatric:        Mood and Affect: Mood normal.        Behavior: Behavior normal.        Thought Content: Thought content normal.    Diabetic foot exam was performed.  No deformities or other abnormal visual findings.  Posterior tibialis and dorsalis pulse intact bilaterally.  Sensation: There is loss of sensation across the dorsum of the right foot.  Last CBC Lab Results  Component Value Date   WBC 5.1 09/11/2022   HGB 9.2 (L) 09/11/2022   HCT 27.6 (L) 09/11/2022   MCV 108 (H) 09/11/2022   MCH 36.1 (H) 09/11/2022   RDW 13.4 09/11/2022   PLT 106 (L) 34/91/7915   Last metabolic panel Lab Results  Component Value Date   GLUCOSE 214 (H) 09/11/2022   NA 137 09/11/2022   K 3.5 09/11/2022   CL 107 (H) 09/11/2022   CO2 14 (L)  09/11/2022   BUN 35 (H) 09/11/2022   CREATININE 1.87 (H) 09/11/2022   EGFR 42 (L) 09/11/2022   CALCIUM 8.9 09/11/2022   PHOS 2.4 (L) 11/19/2020  PROT 6.0 09/11/2022   ALBUMIN 3.7 (L) 09/11/2022   LABGLOB 2.3 09/11/2022   AGRATIO 1.6 09/11/2022   BILITOT 1.1 09/11/2022   ALKPHOS 166 (H) 09/11/2022   AST 41 (H) 09/11/2022   ALT 26 09/11/2022   ANIONGAP 6 08/20/2022   Last lipids Lab Results  Component Value Date   CHOL 216 (H) 10/24/2020   HDL 70 10/24/2020   LDLCALC 121 (H) 10/24/2020   TRIG 91 11/18/2020   CHOLHDL 3.1 10/24/2020   Last hemoglobin A1c Lab Results  Component Value Date   HGBA1C 8.7 (H) 06/29/2021   Last thyroid functions Lab Results  Component Value Date   TSH 9.034 (H) 06/01/2018   Last vitamin B12 and Folate Lab Results  Component Value Date   VITAMINB12 555 08/17/2022   FOLATE 28.9 08/17/2022   The 10-year ASCVD risk score (Arnett DK, et al., 2019) is: 10.5%    Assessment & Plan:   Problem List Items Addressed This Visit       Alcoholic cirrhosis of liver with ascites ([place])    Presenting today for follow-up.  He has establish care with [facility] transplant center.  Transplant candidacy evaluation is ongoing.  They are awaiting a phone call, likely tomorrow, for neck steps.  In the interim, his medications been adjusted.  He is no longer taking Bumex/spironolactone.  He will undergo repeat therapeutic paracentesis tomorrow (10/13).  Positive fluid wave appreciated today on exam.  No jaundice or asterixis noted. -No medication changes today -He is awaiting a phone call from [place] transplant center for next steps      Diabetes [facility] - Primary    His last HgbA1c was 8.7 in July 2020.  He is not currently on any medication for diabetes.  On recent blood work, his blood glucose level has consistently been 100-120. -Repeat A1c today -Diabetic foot exam completed today -Referral placed to ophthalmology for diabetic eye care      TOBACCO ABUSE    He  continues to smoke 0.5 packs/day of cigarettes.  He has previously smoked up to 2 packs/day and has been smoking since age 65.  I congratulated him on his progress and encouraged him to achieve complete cessation.  He declined NRT products today.      Need for shingles vaccine    Shingrix vaccine administered today      Need for influenza vaccination    Influenza vaccine administered today      Return in about 8 weeks (around 11/12/2022).    [name], MD

## 2022-09-20 NOTE — Assessment & Plan Note (Signed)
He continues to smoke 0.5 packs/day of cigarettes.  He has previously smoked up to 2 packs/day and has been smoking since age 56.  I congratulated him on his progress and encouraged him to achieve complete cessation.  He declined NRT products today.

## 2022-09-20 NOTE — Assessment & Plan Note (Signed)
Shingrix vaccine administered today

## 2022-09-21 ENCOUNTER — Ambulatory Visit (HOSPITAL_COMMUNITY)
Admission: RE | Admit: 2022-09-21 | Discharge: 2022-09-21 | Disposition: A | Discharge status: Home / Self Care | Payer: Managed Care, Other (non HMO) | Source: Ambulatory Visit | Attending: Gastroenterology

## 2022-09-21 ENCOUNTER — Encounter (HOSPITAL_COMMUNITY): Payer: Self-pay

## 2022-09-21 DIAGNOSIS — R188 Other ascites: Secondary | ICD-10-CM | POA: Insufficient documentation

## 2022-09-21 LAB — GRAM STAIN

## 2022-09-21 MED ORDER — ALBUMIN HUMAN 25 % IV SOLN: 25.0000 g | Freq: Once | INTRAVENOUS | Status: AC

## 2022-09-21 MED ORDER — SODIUM CHLORIDE FLUSH 0.9 % IV SOLN
INTRAVENOUS | Status: AC
  Administered 2022-09-21: 10 mL via INTRAVENOUS
  Filled 2022-09-21: qty 10

## 2022-09-21 MED ORDER — ALBUMIN HUMAN 25 % IV SOLN
INTRAVENOUS | Status: AC
  Administered 2022-09-21: 25 g via INTRAVENOUS
  Filled 2022-09-21: qty 100

## 2022-09-21 NOTE — Progress Notes (Signed)
PT tolerated left sided paracentesis procedure and 25G of IV albumin well today and 4 Liters of hazy yellow ascites removed with labs collected and sent for processing. PT verbalized understanding of discharge instructions and ambulatory at departure with no acute distress noted.

## 2022-09-21 NOTE — Procedures (Signed)
PreOperative Dx: Alcoholic cirrhosis, ascites Postoperative Dx: Alcoholic cirrhosis, ascites Procedure:   US guided paracentesis Radiologist:  Thornton Papas Anesthesia:  10 ml of 1% lidocaine Specimen:  4.0 L of yellow ascitic fluid EBL:   < 1 ml Complications:  None

## 2022-09-24 ENCOUNTER — Other Ambulatory Visit: Payer: Self-pay | Admitting: *Deleted

## 2022-09-24 DIAGNOSIS — R188 Other ascites: Secondary | ICD-10-CM

## 2022-09-25 ENCOUNTER — Telehealth: Payer: Self-pay | Admitting: Interventional Radiology

## 2022-09-25 ENCOUNTER — Other Ambulatory Visit: Payer: Self-pay | Admitting: Interventional Radiology

## 2022-09-25 DIAGNOSIS — K766 Portal hypertension: Secondary | ICD-10-CM

## 2022-09-25 DIAGNOSIS — I864 Gastric varices: Secondary | ICD-10-CM

## 2022-09-25 LAB — CYTOLOGY - NON PAP

## 2022-09-25 NOTE — Telephone Encounter (Signed)
09/25/22-Patients wife stated that he was being worked up for possible liver transplant at [facility]/will call back if IR assistance is needed/sb

## 2022-09-26 ENCOUNTER — Encounter (HOSPITAL_COMMUNITY): Payer: Self-pay

## 2022-09-26 ENCOUNTER — Inpatient Hospital Stay (HOSPITAL_COMMUNITY)
Admission: EM | Admit: 2022-09-26 | Discharge: 2022-09-29 | DRG: 433 | Disposition: A | Discharge status: Home / Self Care | Payer: Managed Care, Other (non HMO) | Attending: Internal Medicine | Admitting: Internal Medicine

## 2022-09-26 ENCOUNTER — Other Ambulatory Visit: Payer: Self-pay

## 2022-09-26 DIAGNOSIS — I129 Hypertensive chronic kidney disease with stage 1 through stage 4 chronic kidney disease, or unspecified chronic kidney disease: Secondary | ICD-10-CM | POA: Diagnosis present

## 2022-09-26 DIAGNOSIS — E119 Type 2 diabetes mellitus without complications: Secondary | ICD-10-CM | POA: Diagnosis not present

## 2022-09-26 DIAGNOSIS — E1142 Type 2 diabetes mellitus with diabetic polyneuropathy: Secondary | ICD-10-CM | POA: Diagnosis present

## 2022-09-26 DIAGNOSIS — E869 Volume depletion, unspecified: Secondary | ICD-10-CM | POA: Diagnosis present

## 2022-09-26 DIAGNOSIS — E872 Acidosis, unspecified: Secondary | ICD-10-CM | POA: Diagnosis present

## 2022-09-26 DIAGNOSIS — F17210 Nicotine dependence, cigarettes, uncomplicated: Secondary | ICD-10-CM | POA: Diagnosis present

## 2022-09-26 DIAGNOSIS — K7031 Alcoholic cirrhosis of liver with ascites: Secondary | ICD-10-CM | POA: Diagnosis present

## 2022-09-26 DIAGNOSIS — Z79899 Other long term (current) drug therapy: Secondary | ICD-10-CM

## 2022-09-26 DIAGNOSIS — N179 Acute kidney failure, unspecified: Secondary | ICD-10-CM | POA: Diagnosis present

## 2022-09-26 DIAGNOSIS — K219 Gastro-esophageal reflux disease without esophagitis: Secondary | ICD-10-CM | POA: Diagnosis present

## 2022-09-26 DIAGNOSIS — J449 Chronic obstructive pulmonary disease, unspecified: Secondary | ICD-10-CM | POA: Diagnosis present

## 2022-09-26 DIAGNOSIS — F419 Anxiety disorder, unspecified: Secondary | ICD-10-CM | POA: Diagnosis present

## 2022-09-26 DIAGNOSIS — E782 Mixed hyperlipidemia: Secondary | ICD-10-CM | POA: Diagnosis present

## 2022-09-26 DIAGNOSIS — I8500 Esophageal varices without bleeding: Secondary | ICD-10-CM | POA: Diagnosis present

## 2022-09-26 DIAGNOSIS — E876 Hypokalemia: Secondary | ICD-10-CM | POA: Diagnosis present

## 2022-09-26 DIAGNOSIS — Z8249 Family history of ischemic heart disease and other diseases of the circulatory system: Secondary | ICD-10-CM

## 2022-09-26 DIAGNOSIS — R112 Nausea with vomiting, unspecified: Secondary | ICD-10-CM

## 2022-09-26 DIAGNOSIS — N1832 Chronic kidney disease, stage 3b: Secondary | ICD-10-CM | POA: Diagnosis present

## 2022-09-26 DIAGNOSIS — E1122 Type 2 diabetes mellitus with diabetic chronic kidney disease: Secondary | ICD-10-CM | POA: Diagnosis present

## 2022-09-26 LAB — CBC
HCT: 31.7 % — ABNORMAL LOW (ref 39.0–52.0)
Hemoglobin: 10.9 g/dL — ABNORMAL LOW (ref 13.0–17.0)
MCH: 35.9 pg — ABNORMAL HIGH (ref 26.0–34.0)
MCHC: 34.4 g/dL (ref 30.0–36.0)
MCV: 104.3 fL — ABNORMAL HIGH (ref 80.0–100.0)
Platelets: 113 10*3/uL — ABNORMAL LOW (ref 150–400)
RBC: 3.04 MIL/uL — ABNORMAL LOW (ref 4.22–5.81)
RDW: 14.2 % (ref 11.5–15.5)
WBC: 5.4 10*3/uL (ref 4.0–10.5)
nRBC: 0 % (ref 0.0–0.2)

## 2022-09-26 LAB — COMPREHENSIVE METABOLIC PANEL
ALT: 25 U/L (ref 0–44)
AST: 43 U/L — ABNORMAL HIGH (ref 15–41)
Albumin: 3.6 g/dL (ref 3.5–5.0)
Alkaline Phosphatase: 172 U/L — ABNORMAL HIGH (ref 38–126)
Anion gap: 10 (ref 5–15)
BUN: 55 mg/dL — ABNORMAL HIGH (ref 6–20)
CO2: 14 mmol/L — ABNORMAL LOW (ref 22–32)
Calcium: 9.3 mg/dL (ref 8.9–10.3)
Chloride: 112 mmol/L — ABNORMAL HIGH (ref 98–111)
Creatinine, Ser: 3.86 mg/dL — ABNORMAL HIGH (ref 0.61–1.24)
GFR, Estimated: 18 mL/min — ABNORMAL LOW (ref >60)
Glucose, Bld: 164 mg/dL — ABNORMAL HIGH (ref 70–99)
Potassium: 2.6 mmol/L — CL (ref 3.5–5.1)
Sodium: 136 mmol/L (ref 135–145)
Total Bilirubin: 2.1 mg/dL — ABNORMAL HIGH (ref 0.3–1.2)
Total Protein: 7.1 g/dL (ref 6.5–8.1)

## 2022-09-26 LAB — URINALYSIS, ROUTINE W REFLEX MICROSCOPIC
Bilirubin Urine: NEGATIVE
Glucose, UA: NEGATIVE mg/dL
Hgb urine dipstick: NEGATIVE
Ketones, ur: NEGATIVE mg/dL
Leukocytes,Ua: NEGATIVE
Nitrite: NEGATIVE
Protein, ur: NEGATIVE mg/dL
Specific Gravity, Urine: 1.012 (ref 1.005–1.030)
pH: 6 (ref 5.0–8.0)

## 2022-09-26 LAB — CULTURE, BODY FLUID W GRAM STAIN -BOTTLE: Culture: NO GROWTH

## 2022-09-26 LAB — HEMOGLOBIN A1C
Est. average glucose Bld gHb Est-mCnc: 131 mg/dL
Hgb A1c MFr Bld: 6.2 % — ABNORMAL HIGH (ref 4.8–5.6)

## 2022-09-26 LAB — LIPASE, BLOOD: Lipase: 71 U/L — ABNORMAL HIGH (ref 11–51)

## 2022-09-26 MED ORDER — POTASSIUM CHLORIDE 10 MEQ/100ML IV SOLN
10.0000 meq | Freq: Once | INTRAVENOUS | Status: DC
  Filled 2022-09-26: qty 100

## 2022-09-26 MED ORDER — POTASSIUM CHLORIDE IN NACL 40-0.9 MEQ/L-% IV SOLN
INTRAVENOUS | Status: DC
  Filled 2022-09-26 (×3): qty 1000

## 2022-09-26 MED ORDER — ONDANSETRON HCL 4 MG/2ML IJ SOLN
4.0000 mg | Freq: Four times a day (QID) | INTRAMUSCULAR | Status: DC
  Administered 2022-09-27 – 2022-09-29 (×10): 4 mg via INTRAVENOUS
  Filled 2022-09-26 (×10): qty 2

## 2022-09-26 MED ORDER — THIAMINE MONONITRATE 100 MG PO TABS
100.0000 mg | ORAL_TABLET | Freq: Every day | ORAL | Status: DC
  Administered 2022-09-27 – 2022-09-29 (×3): 100 mg via ORAL
  Filled 2022-09-26 (×5): qty 1

## 2022-09-26 MED ORDER — LACTULOSE 10 GM/15ML PO SOLN
30.0000 g | Freq: Four times a day (QID) | ORAL | Status: DC
  Administered 2022-09-26 – 2022-09-29 (×10): 30 g via ORAL
  Filled 2022-09-26 (×11): qty 60

## 2022-09-26 MED ORDER — ONDANSETRON HCL 4 MG/2ML IJ SOLN
4.0000 mg | Freq: Once | INTRAMUSCULAR | Status: AC
  Administered 2022-09-26: 4 mg via INTRAVENOUS
  Filled 2022-09-26: qty 2

## 2022-09-26 MED ORDER — SODIUM CHLORIDE 0.9 % IV BOLUS
500.0000 mL | Freq: Once | INTRAVENOUS | Status: AC
  Administered 2022-09-26: 500 mL via INTRAVENOUS

## 2022-09-26 MED ORDER — ADULT MULTIVITAMIN W/MINERALS CH
1.0000 | ORAL_TABLET | Freq: Every day | ORAL | Status: DC
  Administered 2022-09-26 – 2022-09-29 (×4): 1 via ORAL
  Filled 2022-09-26 (×7): qty 1

## 2022-09-26 MED ORDER — ACETAMINOPHEN 325 MG PO TABS: 650.0000 mg | ORAL_TABLET | Freq: Four times a day (QID) | ORAL | Status: DC | PRN

## 2022-09-26 MED ORDER — SODIUM CHLORIDE 0.9 % IV BOLUS
1000.0000 mL | Freq: Once | INTRAVENOUS | Status: AC
  Administered 2022-09-26: 1000 mL via INTRAVENOUS

## 2022-09-26 MED ORDER — FOLIC ACID 1 MG PO TABS
1.0000 mg | ORAL_TABLET | Freq: Every day | ORAL | Status: DC
  Administered 2022-09-26 – 2022-09-29 (×4): 1 mg via ORAL
  Filled 2022-09-26 (×4): qty 1

## 2022-09-26 MED ORDER — POTASSIUM CHLORIDE IN NACL 40-0.9 MEQ/L-% IV SOLN: INTRAVENOUS | Status: DC

## 2022-09-26 MED ORDER — POTASSIUM CHLORIDE CRYS ER 20 MEQ PO TBCR
20.0000 meq | EXTENDED_RELEASE_TABLET | Freq: Once | ORAL | Status: AC
  Administered 2022-09-26: 20 meq via ORAL
  Filled 2022-09-26: qty 1

## 2022-09-26 MED ORDER — POTASSIUM CHLORIDE CRYS ER 20 MEQ PO TBCR
40.0000 meq | EXTENDED_RELEASE_TABLET | Freq: Once | ORAL | Status: AC
  Administered 2022-09-26: 40 meq via ORAL
  Filled 2022-09-26: qty 2

## 2022-09-26 MED ORDER — PAROXETINE HCL 20 MG PO TABS
40.0000 mg | ORAL_TABLET | Freq: Every day | ORAL | Status: DC
  Administered 2022-09-27 – 2022-09-29 (×3): 40 mg via ORAL
  Filled 2022-09-26 (×3): qty 2

## 2022-09-26 MED ORDER — ACETAMINOPHEN 650 MG RE SUPP: 650.0000 mg | Freq: Four times a day (QID) | RECTAL | Status: DC | PRN

## 2022-09-26 MED ORDER — POTASSIUM CHLORIDE IN NACL 20-0.9 MEQ/L-% IV SOLN
INTRAVENOUS | Status: DC
  Filled 2022-09-26: qty 1000

## 2022-09-26 MED ORDER — RIFAXIMIN 550 MG PO TABS
550.0000 mg | ORAL_TABLET | Freq: Two times a day (BID) | ORAL | Status: DC
  Administered 2022-09-26 – 2022-09-29 (×6): 550 mg via ORAL
  Filled 2022-09-26 (×6): qty 1

## 2022-09-26 MED ORDER — PROCHLORPERAZINE EDISYLATE 10 MG/2ML IJ SOLN: 10.0000 mg | Freq: Four times a day (QID) | INTRAMUSCULAR | Status: DC | PRN

## 2022-09-26 NOTE — Assessment & Plan Note (Addendum)
Repleted Check mag 2.0 --given additional KCl --add KCl to IVF --d/c home with KCl 20 daily.  Repeat BMP 1 week after d/c

## 2022-09-26 NOTE — Assessment & Plan Note (Signed)
No longer taking a statin

## 2022-09-26 NOTE — Assessment & Plan Note (Addendum)
Start Zofran around-the-clock Has been chronic--suspect portal HTN gastropathy Continue pantoprazole twice daily Overall improved Advance to low sodium diet>>tolerating diet

## 2022-09-26 NOTE — Assessment & Plan Note (Signed)
Currently not on any agents Monitor CBGs intermittently 10/18 A1C--6.2

## 2022-09-26 NOTE — ED Notes (Signed)
Date and time results received: 09/26/22 1405 (use smartphrase ".now" to insert current time)  Test: potassium Critical Value: 2.6  Name of Provider Notified: [place]  Orders Received? Or Actions Taken?: wil place in epic

## 2022-09-26 NOTE — H&P (Signed)
History and Physical    Patient: Bradley Miles CBS:[id number] DOB: 11/02/1966 DOA: 09/26/2022 DOS: the patient was seen and examined on 09/26/2022 PCP: [name], MD  Patient coming from: Home  Chief Complaint:  Chief Complaint  Patient presents with   Abnormal Labs   HPI: Bradley Miles is a 56 year old male with a history of alcoholic cirrhosis, COPD, diabetes mellitus type 2, peripheral neuropathy, hypertension, hyperlipidemia presenting with nausea and vomiting and abnormal labs.  The patient states that he normally has chronic nausea and vomiting at least once per day.  Occasionally he goes 3 to 4 days without any emesis.  However, the patient has had intractable emesis since 09/23/2022, approximately 2-4 episodes per day.  He denies any hematemesis or coffee-ground emesis.  He denies any new medications.  He denies any NSAIDs.  He does not drink or use any illicit substances.  He is currently being evaluated at [facility] to be placed on their liver transplant list.  Notably, the patient was recently admitted to [facility] from 08/28/2022 to 09/05/2022.  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  At the time of discharge, he was instructed to stop taking his spironolactone and Bumex.  He states that he has not restarted these.  He continues to be compliant with his lactulose and rifaximin.  He and his spouse deny any mentation changes.  He denies any fevers, chills, chest pain, shortness breath, cough, hemoptysis, abdominal pain.  He has not had any hematochezia or melena.  His last paracentesis was on 09/21/2022 removed 4 L. The patient was called by his PCP because of abnormal labs.  He was noted to have an elevated serum creatinine.  He was told to report to the emergency department. In the ED, the patient was afebrile and hemodynamically stable with oxygen saturation 100% on room  air.  BMP showed a sodium 136, potassium 2.6, bicarbonate 14, serum creatinine 3.6.  AST 43, ALT 25, alkaline phosphatase 172, total bilirubin 2.1, lipase 71.  WBC 5.4, hemoglobin 10.9, platelets 113,000.  The patient was started on IV fluids and admitted for further evaluation and treatment.  Review of Systems: As mentioned in the history of present illness. All other systems reviewed and are negative. Past Medical History:  Diagnosis Date   Alcoholic cirrhosis ([place])    Anxiety    Controlled type 2 diabetes mellitus with complication, without long-term current use of insulin ([place]) 06/26/2020   Enlarged liver    GERD (gastroesophageal reflux disease)    Hypertension    Palpitations    PVC's (premature ventricular contractions)    Shortness of breath    Tussive syncope 12/22/2019   Varicose veins    Past Surgical History:  Procedure Laterality Date   BIOPSY  02/02/2019   Procedure: BIOPSY;  Surgeon: Daneil Dolin, MD;  [facility];  Service: Endoscopy;;  gastric   COLONOSCOPY WITH ESOPHAGOGASTRODUODENOSCOPY (EGD)  12/16/2012   internal hemorrhoids, colonic diverticulosis, benign polyps, screening in 2024. EGD with mild erosive reflux esophagitis, small hiatal hernia, negative H.pylori   COLONOSCOPY WITH PROPOFOL N/A 06/22/2021   Procedure: COLONOSCOPY WITH PROPOFOL;  Surgeon: Daneil Dolin, MD;  [facility];  Service: Endoscopy;  Laterality: N/A;  1:45pm   cyst reomved     from spine   ESOPHAGOGASTRODUODENOSCOPY  05/19/09   RMR: Geographic distal esophageal erosions consistent with severe erosive reflux esophagitis. schatzki's  ring s/P dilation/small hiatal hernia otherwise normal stomach   ESOPHAGOGASTRODUODENOSCOPY (EGD) WITH PROPOFOL N/A 02/02/2019   Dr. Gala Romney: retained gastric contents. portal HTN gastropathy   ESOPHAGOGASTRODUODENOSCOPY (EGD) WITH PROPOFOL N/A 11/20/2019   Procedure: ESOPHAGOGASTRODUODENOSCOPY (EGD) WITH PROPOFOL;  Surgeon: Daneil Dolin,  MD;  [facility];  Service: Endoscopy;  Laterality: N/A;   ESOPHAGOGASTRODUODENOSCOPY (EGD) WITH PROPOFOL N/A 11/24/2019   Procedure: ESOPHAGOGASTRODUODENOSCOPY (EGD) WITH PROPOFOL;  Surgeon: Ronnette Juniper, MD;  Location: [place];  Service: Gastroenterology;  Laterality: N/A;   ESOPHAGOGASTRODUODENOSCOPY (EGD) WITH PROPOFOL N/A 08/08/2020   Procedure: ESOPHAGOGASTRODUODENOSCOPY (EGD) WITH PROPOFOL;  Surgeon: Daneil Dolin, MD;  [facility];  Service: Endoscopy;  Laterality: N/A;   ESOPHAGOGASTRODUODENOSCOPY (EGD) WITH PROPOFOL N/A 11/15/2020   Procedure: ESOPHAGOGASTRODUODENOSCOPY (EGD) WITH PROPOFOL;  Surgeon: Ladene Artist, MD;  Location: [facility] ENDOSCOPY;  Service: Endoscopy;  Laterality: N/A;   ESOPHAGOGASTRODUODENOSCOPY (EGD) WITH PROPOFOL N/A 08/19/2022   Procedure: ESOPHAGOGASTRODUODENOSCOPY (EGD) WITH PROPOFOL;  Surgeon: Harvel Quale, MD;  [facility];  Service: Gastroenterology;  Laterality: N/A;   IR ANGIOGRAM SELECTIVE EACH ADDITIONAL VESSEL  11/22/2019   IR ANGIOGRAM SELECTIVE EACH ADDITIONAL VESSEL  11/24/2019   IR ANGIOGRAM SELECTIVE EACH ADDITIONAL VESSEL  11/24/2019   IR ANGIOGRAM SELECTIVE EACH ADDITIONAL VESSEL  11/24/2019   IR ANGIOGRAM SELECTIVE EACH ADDITIONAL VESSEL  11/15/2020   IR EMBO ART  VEN HEMORR LYMPH EXTRAV  INC GUIDE ROADMAPPING  11/22/2019   IR EMBO ART  VEN HEMORR LYMPH EXTRAV  INC GUIDE ROADMAPPING  11/24/2019   IR EMBO ART  VEN HEMORR LYMPH EXTRAV  INC GUIDE ROADMAPPING  11/15/2020   IR FLUORO GUIDE CV LINE RIGHT  11/15/2020   IR RADIOLOGIST EVAL & MGMT  12/29/2019   IR RADIOLOGIST EVAL & MGMT  10/27/2020   IR RADIOLOGIST EVAL & MGMT  04/19/2021   IR TIPS  11/24/2019   IR US GUIDE VASC ACCESS RIGHT  11/22/2019   IR US GUIDE VASC ACCESS RIGHT  11/15/2020   IR US GUIDE VASC ACCESS RIGHT  11/15/2020   IR VENOGRAM RENAL UNI LEFT  11/22/2019   IR VENOGRAM RENAL UNI LEFT  11/15/2020   POLYPECTOMY  06/22/2021    Procedure: POLYPECTOMY;  Surgeon: Daneil Dolin, MD;  [facility];  Service: Endoscopy;;   RADIOLOGY WITH ANESTHESIA N/A 11/24/2019   Procedure: IR WITH ANESTHESIA;  Surgeon: Radiologist, Medication, MD;  Location: [place];  Service: Radiology;  Laterality: N/A;   RADIOLOGY WITH ANESTHESIA N/A 11/15/2020   Procedure: IR WITH ANESTHESIA;  Surgeon: Suzette Battiest, MD;  Location: [place];  Service: Radiology;  Laterality: N/A;   TIPS PROCEDURE N/A 11/22/2019   Procedure: BRTO/TRANS-JUGULAR INTRAHEPATIC PORTAL SHUNT (TIPS);  Surgeon: Corrie Mckusick, DO;  Location: [place];  Service: Anesthesiology;  Laterality: N/A;   WISDOM TOOTH EXTRACTION     Social History:  reports that he has been smoking cigarettes. He started smoking about 42 years ago. He has a 30.00 pack-year smoking history. He has never used smokeless tobacco. He reports current alcohol use. He reports that he does not use drugs.  No Known Allergies  Family History  Problem Relation Age of Onset   Cancer Mother        ? etiology   Heart disease Mother    Colon polyps Sister 39       two sisters   Brain cancer Father    Liver disease Neg Hx     Prior to Admission  medications   Medication Sig Start Date End Date Taking? Authorizing Provider  rifaximin (XIFAXAN) 550 MG TABS tablet Take by mouth. 08/14/22  Yes [provider]  allopurinol (ZYLOPRIM) 100 MG tablet Take 1 tablet (100 mg total) by mouth daily. 09/11/22   [name], MD  amoxicillin-clavulanate (AUGMENTIN) 500-125 MG tablet SMARTSIG:1 Tablet(s) By Mouth Every 12 Hours 09/05/22   [provider]  folic acid (FOLVITE) 1 MG tablet Take 1 mg by mouth daily. 06/27/22   [provider]  lactulose (CHRONULAC) 10 GM/15ML solution Take 45 mLs (30 g total) by mouth every 6 (six) hours. Titrate as needed to have 2-4 bowel movements daily. 09/10/22   Sherron Monday, NP  Multiple Vitamin (THERA) TABS Take 1 tablet by mouth daily. 09/06/22  10/06/22  [provider]  omeprazole (PRILOSEC) 20 MG capsule Take 20 mg by mouth daily.    [provider]  ondansetron (ZOFRAN) 8 MG tablet Take 1 tablet (8 mg total) by mouth every 8 (eight) hours as needed for nausea or vomiting. 09/11/22   Sherron Monday, NP  PARoxetine (PAXIL) 40 MG tablet Take 1 tablet (40 mg total) by mouth daily. 08/23/21   Elvia Collum M, DO  thiamine (VITAMIN B1) 100 MG tablet Take 100 mg by mouth daily. 11/23/20   [provider]  XIFAXAN 550 MG TABS tablet TAKE 1 TABLET(550 MG) BY MOUTH TWICE DAILY 07/20/22   Mahala Menghini, PA-C  zinc sulfate 220 (50 Zn) MG capsule Take 50 mg by mouth daily.    [provider]    Physical Exam: Vitals:   09/26/22 1143 09/26/22 1315 09/26/22 1330 09/26/22 1400  BP: 117/80 121/87 118/76 119/74  Pulse: 100 89 84 80  Resp: 18 17 16 18   Temp: 97.7 F (36.5 C)     TempSrc: Oral     SpO2: 100% 100% 100% 100%  Weight:      Height:       GENERAL:  A&O x 3, NAD, well developed, cooperative, follows commands HEENT: Glide/AT, No thrush, No icterus, No oral ulcers Neck:  No neck mass, No meningismus, soft, supple CV: RRR, no S3, no S4, no rub, no JVD Lungs:  CTA, no wheeze, no rhonchi, good air movement Abd: soft/NT +BS, nondistended Ext: No edema, no lymphangitis, no cyanosis, no rashes Neuro:  CN II-XII intact, strength 4/5 in RUE, RLE, strength 4/5 LUE, LLE; sensation intact bilateral; no dysmetria; babinski equivocal  Data Reviewed: Results reviewed above in history   Assessment and Plan: * Acute renal failure superimposed on stage 3b chronic kidney disease, unspecified acute renal failure type (HCC) Baseline creatinine 1.9-2.3 There was serum creatinine 3.86 Secondary to volume depletion Start IV fluids  Hypokalemia Replete Check mag --give additional IV KCl --add KCl to IVF  Intractable nausea and vomiting Start Zofran around-the-clock Start pantoprazole twice daily Clear  liquid diet for now  Controlled type 2 diabetes mellitus without complication, without long-term current use of insulin (HCC) Currently not on any agents Monitor CBGs intermittently  Esophageal varices ([place]) 08/19/2022 EGD--G2 esophageal varices, type II gastroesophageal varices without bleeding; presence of previous 2 scars concerning for previous bleed. 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 There may be small caliber patent left portal vein entering the liver. Abdominal collateral vessels present. -Did not feel anticoagulation was needed at that time Continue Lactulose and Xifaxin  Mixed hyperlipidemia No longer taking a statin      Advance Care Planning: FULL  Consults: none  Family Communication: spouse updated 10/18  Severity of Illness: The appropriate patient status for this patient is INPATIENT. Inpatient status is judged to be reasonable and necessary in order to provide the required intensity of service to ensure the patient's safety. The patient's presenting symptoms, physical exam findings, and initial radiographic and laboratory data in the context of their chronic comorbidities is felt to place them at high risk for further clinical deterioration. Furthermore, it is not anticipated that the patient will be medically stable for discharge from the hospital within  2 midnights of admission.   * I certify that at the point of admission it is my clinical judgment that the patient will require inpatient hospital care spanning beyond 2 midnights from the point of admission due to high intensity of service, high risk for further deterioration and high frequency of surveillance required.*  Author: Orson Eva, MD 09/26/2022 3:43 PM  For on call review [email or web address].

## 2022-09-26 NOTE — Assessment & Plan Note (Signed)
Stable on room air 

## 2022-09-26 NOTE — Progress Notes (Signed)
Order received for Midline placement. Assessed BUE for midline. No suitable vein seen, so deep and multiple bifurcation. Placed 20g 1.88 in the LAFA. Primary RN made aware. Recommended PICC placement if needed.

## 2022-09-26 NOTE — Assessment & Plan Note (Addendum)
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 There may be small caliber patent left portal vein entering the liver. Abdominal collateral vessels present. -Did not feel anticoagulation was needed at that time Continue Lactulose and Xifaxin 10/21 MELD 21 (range 21-26 during hospitalization) 09/28/22 paracentesis>8L

## 2022-09-26 NOTE — Assessment & Plan Note (Signed)
08/19/2022 EGD--G2 esophageal varices, type II gastroesophageal varices without bleeding; presence of previous 2 scars concerning for previous bleed. 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 Repeat US in April 2023 show two thrombosed TIPS -Patient had life-threatening GI bleed with hemorrhagic shock 11/2019

## 2022-09-26 NOTE — ED Triage Notes (Signed)
Pt presents to ED for abnormal labs. Creatinine 4. Pt started vomiting 3 days ago. Pt on liver transplant list at [facility].

## 2022-09-26 NOTE — Assessment & Plan Note (Addendum)
Baseline creatinine 1.9-2.3 There was serum creatinine 3.86 Secondary to volume depletion and hemodynamic changes Renal US--no hydronephrosis Continue IV fluids>>serum creatinine improving Give albumin x 4>>repeat after paracentesis

## 2022-09-26 NOTE — ED Provider Notes (Signed)
[facility] EMERGENCY DEPARTMENT Provider Note   CSN: [id number] Arrival date & time: 09/26/22  1114     History {Add pertinent medical, surgical, social history, OB history to HPI:1} Chief Complaint  Patient presents with   Abnormal Labs    Bradley Miles is a 56 y.o. male.   Weakness      Home Medications Prior to Admission medications   Medication Sig Start Date End Date Taking? Authorizing Provider  allopurinol (ZYLOPRIM) 100 MG tablet Take 1 tablet (100 mg total) by mouth daily. 09/11/22   [name], MD  folic acid (FOLVITE) 1 MG tablet Take 1 mg by mouth daily. 06/27/22   [provider]  lactulose (CHRONULAC) 10 GM/15ML solution Take 45 mLs (30 g total) by mouth every 6 (six) hours. Titrate as needed to have 2-4 bowel movements daily. 09/10/22   Sherron Monday, NP  Multiple Vitamin (THERA) TABS Take 1 tablet by mouth daily. 09/06/22 10/06/22  [provider]  omeprazole (PRILOSEC) 20 MG capsule Take 20 mg by mouth daily.    [provider]  ondansetron (ZOFRAN) 8 MG tablet Take 1 tablet (8 mg total) by mouth every 8 (eight) hours as needed for nausea or vomiting. 09/11/22   Sherron Monday, NP  PARoxetine (PAXIL) 40 MG tablet Take 1 tablet (40 mg total) by mouth daily. 08/23/21   Elvia Collum M, DO  thiamine (VITAMIN B1) 100 MG tablet Take 100 mg by mouth daily. 11/23/20   [provider]  XIFAXAN 550 MG TABS tablet TAKE 1 TABLET(550 MG) BY MOUTH TWICE DAILY 07/20/22   Mahala Menghini, PA-C  zinc sulfate 220 (50 Zn) MG capsule Take 50 mg by mouth daily.    [provider]      Allergies    Patient has no known allergies.    Review of Systems   Review of Systems  Neurological:  Positive for weakness.    Physical Exam Updated Vital Signs BP 119/74   Pulse 80   Temp 97.7 F (36.5 C) (Oral)   Resp 18   Ht 6' (1.829 m)   Wt 104.3 kg   SpO2 100%   BMI 31.19 kg/m  Physical Exam  ED Results / Procedures /  Treatments   Labs (all labs ordered are listed, but only abnormal results are displayed) Labs Reviewed  LIPASE, BLOOD - Abnormal; Notable for the following components:      Result Value   Lipase 71 (*)    All other components within normal limits  COMPREHENSIVE METABOLIC PANEL - Abnormal; Notable for the following components:   Potassium 2.6 (*)    Chloride 112 (*)    CO2 14 (*)    Glucose, Bld 164 (*)    BUN 55 (*)    Creatinine, Ser 3.86 (*)    AST 43 (*)    Alkaline Phosphatase 172 (*)    Total Bilirubin 2.1 (*)    GFR, Estimated 18 (*)    All other components within normal limits  CBC - Abnormal; Notable for the following components:   RBC 3.04 (*)    Hemoglobin 10.9 (*)    HCT 31.7 (*)    MCV 104.3 (*)    MCH 35.9 (*)    Platelets 113 (*)    All other components within normal limits  URINALYSIS, ROUTINE W REFLEX MICROSCOPIC    EKG None  Radiology No results found.  Procedures Procedures  {Document cardiac monitor, telemetry assessment procedure when  appropriate:1}  Medications Ordered in ED Medications  ondansetron (ZOFRAN) injection 4 mg (has no administration in time range)  sodium chloride 0.9 % bolus 500 mL (has no administration in time range)  potassium chloride 10 mEq in 100 mL IVPB (has no administration in time range)  potassium chloride 10 mEq in 100 mL IVPB (has no administration in time range)  sodium chloride 0.9 % bolus 1,000 mL (has no administration in time range)    ED Course/ Medical Decision Making/ A&P                           Medical Decision Making Amount and/or Complexity of Data Reviewed Labs: ordered.  Risk Prescription drug management. Decision regarding hospitalization.   ***  {Document critical care time when appropriate:1} {Document review of labs and clinical decision tools ie heart score, Chads2Vasc2 etc:1}  {Document your independent review of radiology images, and any outside records:1} {Document your discussion  with family members, caretakers, and with consultants:1} {Document social determinants of health affecting pt's care:1} {Document your decision making why or why not admission, treatments were needed:1} Final Clinical Impression(s) / ED Diagnoses Final diagnoses:  AKI (acute kidney injury) ([place])    Rx / DC Orders ED Discharge Orders     None

## 2022-09-26 NOTE — Hospital Course (Signed)
56 year old male with a history of alcoholic cirrhosis, COPD, diabetes mellitus type 2, peripheral neuropathy, hypertension, hyperlipidemia presenting with nausea and vomiting and abnormal labs.  The patient states that he normally has chronic nausea and vomiting at least once per day.  Occasionally he goes 3 to 4 days without any emesis.  However, the patient has had intractable emesis since 09/23/2022, approximately 2-4 episodes per day.  He denies any hematemesis or coffee-ground emesis.  He denies any new medications.  He denies any NSAIDs.  He does not drink or use any illicit substances.  He is currently being evaluated at [facility] to be placed on their liver transplant list.  Notably, the patient was recently admitted to [facility] from 08/28/2022 to 09/05/2022.  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  At the time of discharge, he was instructed to stop taking his spironolactone and Bumex.  He states that he has not restarted these.  He continues to be compliant with his lactulose and rifaximin.  He and his spouse deny any mentation changes.  He denies any fevers, chills, chest pain, shortness breath, cough, hemoptysis, abdominal pain.  He has not had any hematochezia or melena.  His last paracentesis was on 09/21/2022 removed 4 L. The patient was called by his PCP because of abnormal labs.  He was noted to have an elevated serum creatinine.  He was told to report to the emergency department. In the ED, the patient was afebrile and hemodynamically stable with oxygen saturation 100% on room air.  BMP showed a sodium 136, potassium 2.6, bicarbonate 14, serum creatinine 3.6.  AST 43, ALT 25, alkaline phosphatase 172, total bilirubin 2.1, lipase 71.  WBC 5.4, hemoglobin 10.9, platelets 113,000.  The patient was started on IV fluids and admitted for further evaluation and treatment.

## 2022-09-27 ENCOUNTER — Inpatient Hospital Stay (HOSPITAL_COMMUNITY): Payer: Managed Care, Other (non HMO)

## 2022-09-27 ENCOUNTER — Telehealth: Payer: Self-pay | Admitting: Gastroenterology

## 2022-09-27 DIAGNOSIS — K7031 Alcoholic cirrhosis of liver with ascites: Secondary | ICD-10-CM | POA: Diagnosis not present

## 2022-09-27 DIAGNOSIS — N179 Acute kidney failure, unspecified: Secondary | ICD-10-CM | POA: Diagnosis not present

## 2022-09-27 DIAGNOSIS — I8500 Esophageal varices without bleeding: Secondary | ICD-10-CM

## 2022-09-27 DIAGNOSIS — E872 Acidosis, unspecified: Secondary | ICD-10-CM

## 2022-09-27 DIAGNOSIS — E119 Type 2 diabetes mellitus without complications: Secondary | ICD-10-CM | POA: Diagnosis not present

## 2022-09-27 LAB — BLOOD GAS, VENOUS
Acid-base deficit: 10.1 mmol/L — ABNORMAL HIGH (ref 0.0–2.0)
Bicarbonate: 14.2 mmol/L — ABNORMAL LOW (ref 20.0–28.0)
Drawn by: 66297
O2 Saturation: 47.5 %
Patient temperature: 36.6
pCO2, Ven: 27 mmHg — ABNORMAL LOW (ref 44–60)
pH, Ven: 7.34 (ref 7.25–7.43)
pO2, Ven: 31 mmHg — CL (ref 32–45)

## 2022-09-27 LAB — BASIC METABOLIC PANEL
Anion gap: 8 (ref 5–15)
BUN: 51 mg/dL — ABNORMAL HIGH (ref 6–20)
CO2: 12 mmol/L — ABNORMAL LOW (ref 22–32)
Calcium: 8.3 mg/dL — ABNORMAL LOW (ref 8.9–10.3)
Chloride: 116 mmol/L — ABNORMAL HIGH (ref 98–111)
Creatinine, Ser: 3.5 mg/dL — ABNORMAL HIGH (ref 0.61–1.24)
GFR, Estimated: 20 mL/min — ABNORMAL LOW (ref >60)
Glucose, Bld: 157 mg/dL — ABNORMAL HIGH (ref 70–99)
Potassium: 2.9 mmol/L — ABNORMAL LOW (ref 3.5–5.1)
Sodium: 136 mmol/L (ref 135–145)

## 2022-09-27 LAB — PROTIME-INR
INR: 1.5 — ABNORMAL HIGH (ref 0.8–1.2)
Prothrombin Time: 17.7 seconds — ABNORMAL HIGH (ref 11.4–15.2)

## 2022-09-27 LAB — MAGNESIUM: Magnesium: 1.8 mg/dL (ref 1.7–2.4)

## 2022-09-27 MED ORDER — POTASSIUM CHLORIDE CRYS ER 20 MEQ PO TBCR
40.0000 meq | EXTENDED_RELEASE_TABLET | Freq: Once | ORAL | Status: AC
  Administered 2022-09-27: 40 meq via ORAL
  Filled 2022-09-27: qty 2

## 2022-09-27 MED ORDER — POTASSIUM CHLORIDE IN NACL 40-0.9 MEQ/L-% IV SOLN: INTRAVENOUS | Status: DC

## 2022-09-27 MED ORDER — ENSURE ENLIVE PO LIQD
237.0000 mL | Freq: Three times a day (TID) | ORAL | Status: DC
  Administered 2022-09-27 – 2022-09-29 (×5): 237 mL via ORAL

## 2022-09-27 MED ORDER — SODIUM BICARBONATE 650 MG PO TABS
1300.0000 mg | ORAL_TABLET | Freq: Two times a day (BID) | ORAL | Status: DC
  Administered 2022-09-27 – 2022-09-29 (×4): 1300 mg via ORAL
  Filled 2022-09-27 (×4): qty 2

## 2022-09-27 MED ORDER — PANTOPRAZOLE SODIUM 40 MG IV SOLR
40.0000 mg | Freq: Two times a day (BID) | INTRAVENOUS | Status: DC
  Administered 2022-09-27 – 2022-09-29 (×4): 40 mg via INTRAVENOUS
  Filled 2022-09-27 (×4): qty 10

## 2022-09-27 MED ORDER — ALBUMIN HUMAN 25 % IV SOLN
25.0000 g | Freq: Four times a day (QID) | INTRAVENOUS | Status: AC
  Administered 2022-09-27 – 2022-09-28 (×4): 25 g via INTRAVENOUS
  Filled 2022-09-27 (×4): qty 100

## 2022-09-27 NOTE — TOC Initial Note (Signed)
Transition of Care [facility]) - Initial/Assessment Note    Patient Details  Name: Bradley Miles MRN: [id number] Date of Birth: 1966/02/09  Transition of Care [facility]) CM/SW Contact:    Ihor Gully, LCSW Phone Number: 09/27/2022, 2:30 PM  Clinical Narrative:                 Patient from home with spouse. Admitted for  Acute renal failure superimposed on stage 3b chronic kidney disease, unspecified acute renal failure type. Considered high risk for readmission. Has wc, , cane in the home. When he wants to ambulate, wife walks behind him with wc. Sleeps 18-20 hours a day. Dresses himself, wife does stand by assistance with bathing.  TOC will follow and address needs as they arise.   Expected Discharge Plan: Home/Self Care Barriers to Discharge: Continued Medical Work up   Patient Goals and CMS Choice Patient states their goals for this hospitalization and ongoing recovery are:: return home      Expected Discharge Plan and Services Expected Discharge Plan: Home/Self Care       Living arrangements for the past 2 months: Single Family Home                                      Prior Living Arrangements/Services Living arrangements for the past 2 months: Single Family Home Lives with:: Spouse   Do you feel safe going back to the place where you live?: Yes          Current home services: DME    Activities of Daily Living Home Assistive Devices/Equipment: None ADL Screening (condition at time of admission) Patient's cognitive ability adequate to safely complete daily activities?: Yes Is the patient deaf or have difficulty hearing?: No Does the patient have difficulty seeing, even when wearing glasses/contacts?: No Does the patient have difficulty concentrating, remembering, or making decisions?: No Patient able to express need for assistance with ADLs?: Yes Does the patient have difficulty dressing or bathing?: Yes Independently performs ADLs?: Yes (appropriate  for developmental age) Does the patient have difficulty walking or climbing stairs?: No Weakness of Legs: None Weakness of Arms/Hands: None  Permission Sought/Granted Permission sought to share information with : Family Supports    Share Information with NAME: [name]           Emotional Assessment       Orientation: : Oriented to Self, Oriented to Place, Oriented to  Time, Oriented to Situation      Admission diagnosis:  AKI (acute kidney injury) ([place]) [N17.9] Acute renal failure superimposed on stage 3b chronic kidney disease, unspecified acute renal failure type ([place]) [N17.9, N18.32] Patient Active Problem List   Diagnosis Date Noted   Acute renal failure superimposed on stage 3b chronic kidney disease, unspecified acute renal failure type ([place]) 09/26/2022   Intractable nausea and vomiting 09/26/2022   Hypokalemia 09/26/2022   Need for shingles vaccine 09/20/2022   Need for influenza vaccination 09/20/2022   Hepatic encephalopathy ([place]) 08/16/2022   Acute kidney injury ([place]) 08/16/2022   Elevated AFP 10/02/2021   S/P alcohol detoxification 08/24/2021   Depression 08/24/2021   Hematemesis 11/15/2020   GIB (gastrointestinal bleeding) 08/05/2020   Acute GI bleeding 08/05/2020   Gastric varices    Thrombocytopenia ([place]) 06/26/2020   Controlled type 2 diabetes mellitus without complication, without long-term current use of insulin ([place]) 06/26/2020   Tussive syncope 12/22/2019   Acute  blood loss anemia    Alcohol dependence with inpatient treatment [facility])    Esophageal varices ([place])    Hematemesis/vomiting blood 11/20/2019   RUQ pain 07/30/2019   Abnormal LFTs 07/30/2019   Bilateral leg edema 06/03/2019   Portal hypertension ([place]) 11/26/2018   Hepatic steatosis 06/27/2018   Abdominal pain    Alcoholic hepatitis without ascites    Acute hepatic encephalopathy ([place]) 06/01/2018   Acute respiratory failure with hypoxia ([place]) 47/65/4650   Alcoholic cirrhosis of liver  with ascites ([place]) 06/01/2018   COPD (chronic obstructive pulmonary disease) ([place]) 35/46/5681   Alcoholic peripheral neuropathy ([place]) 06/01/2018   Increased ammonia level    COPD, mild ([place]) 05/14/2017   Right leg claudication ([place]) 05/14/2017   Prediabetes 02/04/2017   Chest pain 12/21/2015   Right leg DVT ([place]) 07/18/2015   Elevated transaminase measurement 07/18/2015   Sensory neuropathy 07/16/2015   Anxiety 12/23/2014   EtOH dependence ([place]) 02/09/2014   Headache in back of head 01/12/2014   Superficial thrombosis of lower extremity 07/01/2013   Hematochezia 11/17/2012   Mixed hyperlipidemia 02/15/2012   Hypertension 01/18/2012   Insomnia 01/18/2012   Family history of colonic polyps 11/15/2011   Palpitations 10/04/2011   TOBACCO ABUSE 05/18/2010   GERD 05/18/2010   CONSTIPATION 05/18/2010   ERECTILE DYSFUNCTION, 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

## 2022-09-27 NOTE — Progress Notes (Addendum)
PROGRESS NOTE  Bradley Miles WVP:[id number] DOB: 16-Nov-1966 DOA: 09/26/2022 PCP: [name], MD  Brief History:  56 year old male with a history of alcoholic cirrhosis, COPD, diabetes mellitus type 2, peripheral neuropathy, hypertension, hyperlipidemia presenting with nausea and vomiting and abnormal labs.  The patient states that he normally has chronic nausea and vomiting at least once per day.  Occasionally he goes 3 to 4 days without any emesis.  However, the patient has had intractable emesis since 09/23/2022, approximately 2-4 episodes per day.  He denies any hematemesis or coffee-ground emesis.  He denies any new medications.  He denies any NSAIDs.  He does not drink or use any illicit substances.  He is currently being evaluated at [facility] to be placed on their liver transplant list.  Notably, the patient was recently admitted to [facility] from 08/28/2022 to 09/05/2022.  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  At the time of discharge, he was instructed to stop taking his spironolactone and Bumex.  He states that he has not restarted these.  He continues to be compliant with his lactulose and rifaximin.  He and his spouse deny any mentation changes.  He denies any fevers, chills, chest pain, shortness breath, cough, hemoptysis, abdominal pain.  He has not had any hematochezia or melena.  His last paracentesis was on 09/21/2022 removed 4 L. The patient was called by his PCP because of abnormal labs.  He was noted to have an elevated serum creatinine.  He was told to report to the emergency department. In the ED, the patient was afebrile and hemodynamically stable with oxygen saturation 100% on room air.  BMP showed a sodium 136, potassium 2.6, bicarbonate 14, serum creatinine 3.6.  AST 43, ALT 25, alkaline phosphatase 172, total bilirubin 2.1, lipase 71.  WBC 5.4, hemoglobin  10.9, platelets 113,000.  The patient was started on IV fluids and admitted for further evaluation and treatment.     Assessment and Plan: * Acute renal failure superimposed on stage 3b chronic kidney disease, unspecified acute renal failure type (HCC) Baseline creatinine 1.9-2.3 There was serum creatinine 3.86 Secondary to volume depletion and hemodynamic changes Renal US--no hydronephrosis Continue IV fluids>>serum creatinine improving Give albumin x 4  Metabolic acidosis Check VBG  Hypokalemia Replete Check mag 1.8 --give additional KCl --add KCl to IVF  Intractable nausea and vomiting Start Zofran around-the-clock Has been chronic--suspect portal HTN gastropathy Continue pantoprazole twice daily Advance to full liquids  Controlled type 2 diabetes mellitus without complication, without long-term current use of insulin (HCC) Currently not on any agents Monitor CBGs intermittently 10/18 A1C--6.2  Esophageal varices ([place]) 08/19/2022 EGD--G2 esophageal varices, type II gastroesophageal varices without bleeding; presence of previous 2 scars concerning for previous bleed. 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 There may be small caliber patent left portal vein  entering the liver. Abdominal collateral vessels present. -Did not feel anticoagulation was needed at that  time Continue Lactulose and Xifaxin  Mixed hyperlipidemia No longer taking a statin     Family Communication:   spouse updated at bedside 10/19  Consultants:  none  Code Status:  FULL   DVT Prophylaxis:  SCDs   Procedures: As Listed in Progress Note Above  Antibiotics: None       Subjective: Vomiting is improving.  Had one episode dry heave.  Denies f/c, cp, sob, hematemesis, diarrhea, hematochezia, melena, abd pain  Objective: Vitals:   09/27/22 0006 09/27/22 0538 09/27/22 0800 09/27/22 1411  BP: 102/65 (!) 101/53 105/69 (!) 92/56  Pulse: 87 91 89 94  Resp: 18 18 17 18   Temp: 99.2 F (37.3 C) (!) 97.2 F (36.2 C) 98.2 F (36.8 C) 97.9 F (36.6 C)  TempSrc:      SpO2: 100% 100% 100% 100%  Weight:      Height:        Intake/Output Summary (Last 24 hours) at 09/27/2022 1533 Last data filed at 09/27/2022 1429 Gross per 24 hour  Intake 2346.66 ml  Output --  Net 2346.66 ml   Weight change:  Exam:  General:  Pt is alert, follows commands appropriately, not in acute distress HEENT: No icterus, No thrush, No neck mass, [place]/AT Cardiovascular: RRR, S1/S2, no rubs, no gallops Respiratory: CTA bilaterally, no wheezing, no crackles, no rhonchi Abdomen: Soft/+BS, non tender, non distended, no guarding Extremities: No edema, No lymphangitis, No petechiae, No rashes, no synovitis   Data Reviewed: I have personally reviewed following labs and imaging studies Basic Metabolic Panel: Recent Labs  Lab 09/26/22 1244 09/27/22 0533  NA 136 136  K 2.6* 2.9*  CL 112* 116*  CO2 14* 12*  GLUCOSE 164* 157*  BUN 55* 51*  CREATININE 3.86* 3.50*  CALCIUM 9.3 8.3*  MG  --  1.8   Liver Function Tests: Recent Labs  Lab 09/26/22 1244  AST 43*  ALT 25  ALKPHOS 172*  BILITOT 2.1*  PROT 7.1  ALBUMIN 3.6   Recent Labs  Lab 09/26/22 1244  LIPASE 71*   No results for input(s): "AMMONIA" in the last 168 hours. Coagulation Profile: Recent Labs  Lab  09/27/22 0533  INR 1.5*   CBC: Recent Labs  Lab 09/26/22 1244  WBC 5.4  HGB 10.9*  HCT 31.7*  MCV 104.3*  PLT 113*   Cardiac Enzymes: No results for input(s): "CKTOTAL", "CKMB", "CKMBINDEX", "TROPONINI" in the last 168 hours. BNP: Invalid input(s): "POCBNP" CBG: No results for input(s): "GLUCAP" in the last 168 hours. HbA1C: Recent Labs    09/25/22 1622  HGBA1C 6.2*   Urine analysis:    Component Value Date/Time   COLORURINE YELLOW 09/26/2022 1810   APPEARANCEUR CLEAR 09/26/2022 1810   LABSPEC 1.012 09/26/2022 1810   PHURINE 6.0 09/26/2022 1810   GLUCOSEU NEGATIVE 09/26/2022 1810   HGBUR NEGATIVE 09/26/2022 1810   BILIRUBINUR NEGATIVE 09/26/2022 1810   KETONESUR NEGATIVE 09/26/2022 1810   PROTEINUR NEGATIVE 09/26/2022 1810   NITRITE NEGATIVE 09/26/2022 1810   LEUKOCYTESUR NEGATIVE 09/26/2022 1810   Sepsis Labs: @LABRCNTIP (procalcitonin:4,lacticidven:4) ) Recent Results (from the past 240 hour(s))  Gram stain     Status: None   Collection Time: 09/21/22  9:37 AM   Specimen: Peritoneal Washings  Result Value Ref Range Status   Specimen Description PERITONEAL  Final   Special Requests NONE  Final   Gram Stain   Final  CYTOSPIN SMEAR NO ORGANISMS SEEN WBC PRESENT, PREDOMINANTLY MONONUCLEAR Performed at [facility], [address] [place], [place] [address]    Report Status 09/21/2022 FINAL  Final  Culture, body fluid w Gram Stain-bottle     Status: None   Collection Time: 09/21/22  9:37 AM   Specimen: Peritoneal Washings  Result Value Ref Range Status   Specimen Description PERITONEAL  Final   Special Requests 10CC  Final   Culture   Final    NO GROWTH 5 DAYS Performed at [facility], [address], [place], [place] [address]    Report Status 09/26/2022 FINAL  Final     Scheduled Meds:  folic acid  1 mg Oral Daily   lactulose  30 g Oral Q6H   multivitamin with minerals  1 tablet Oral Daily   ondansetron (ZOFRAN) IV  4 mg Intravenous Q6H    pantoprazole (PROTONIX) IV  40 mg Intravenous Q12H   PARoxetine  40 mg Oral Daily   potassium chloride  40 mEq Oral Once   rifaximin  550 mg Oral BID   thiamine  100 mg Oral Daily   Continuous Infusions:  0.9 % NaCl with KCl 40 mEq / L     albumin human      Procedures/Studies: US RENAL  Result Date: 09/27/2022 CLINICAL DATA:  Acute renal failure.  Chronic kidney disease EXAM: RENAL / URINARY TRACT ULTRASOUND COMPLETE COMPARISON:  02/08/2021 FINDINGS: Right Kidney: Renal measurements: 10.7 x 5.0 x 4.8 cm = volume: 134 mL. Slightly increased cortical echogenicity. No mass or hydronephrosis visualized. Left Kidney: Renal measurements: 11.7 x 6.2 x 5.5 cm = volume: 208 mL. Slightly increased cortical echogenicity. No mass or hydronephrosis visualized. Bladder: Appears normal for degree of bladder distention. Other: Moderate volume ascites. IMPRESSION: 1. No evidence of obstructive uropathy. 2. Slightly increased cortical echogenicity of the bilateral kidneys as can be seen with chronic medical renal disease. 3. Moderate volume ascites. Electronically Signed   By: Davina Poke D.O.   On: 09/27/2022 12:01   US Paracentesis  Result Date: 09/21/2022 INDICATION: Alcoholic cirrhosis, ascites EXAM: ULTRASOUND GUIDED DIAGNOSTIC AND THERAPEUTIC PARACENTESIS MEDICATIONS: None. COMPLICATIONS: None immediate. PROCEDURE: Informed written consent was obtained from the patient after a discussion of the risks, benefits and alternatives to treatment. A timeout was performed prior to the initiation of the procedure. Initial ultrasound scanning demonstrates a large amount of ascites within the LEFT lower abdominal quadrant. The right lower abdomen was prepped and draped in the usual sterile fashion. 1% lidocaine was used for local anesthesia. Following this, a 19 gauge, 7-cm, Yueh catheter was introduced. An ultrasound image was saved for documentation purposes. The paracentesis was performed. The catheter was  removed and a dressing was applied. The patient tolerated the procedure well without immediate post procedural complication. Patient received post-procedure intravenous albumin; see nursing notes for details. FINDINGS: A total of approximately 4.0 L of yellow ascitic fluid was removed. Samples were sent to the laboratory as requested by the clinical team. IMPRESSION: Successful ultrasound-guided paracentesis yielding 4.0 liters of peritoneal fluid. Electronically Signed   By: Lavonia Dana M.D.   On: 09/21/2022 12:04   US Paracentesis  Result Date: 09/14/2022 INDICATION: Alcoholic cirrhosis, ascites EXAM: ULTRASOUND GUIDED DIAGNOSTIC AND THERAPEUTIC PARACENTESIS MEDICATIONS: None. COMPLICATIONS: None immediate. PROCEDURE: Informed written consent was obtained from the patient after a discussion of the risks, benefits and alternatives to treatment. A timeout was performed prior to the initiation of the procedure. Initial ultrasound scanning demonstrates a  large amount of ascites within the LEFT lower abdominal quadrant. The right lower abdomen was prepped and draped in the usual sterile fashion. 1% lidocaine was used for local anesthesia. Following this, a 19 gauge, 7-cm, Yueh catheter was introduced. An ultrasound image was saved for documentation purposes. The paracentesis was performed. The catheter was removed and a dressing was applied. The patient tolerated the procedure well without immediate post procedural complication. Patient received post-procedure intravenous albumin; see nursing notes for details. FINDINGS: A total of approximately 4.2 L of yellow slightly cloudy ascitic fluid was removed. Samples were sent to the laboratory as requested by the clinical team. IMPRESSION: Successful ultrasound-guided paracentesis yielding 4.2 liters of peritoneal fluid. Electronically Signed   By: Lavonia Dana M.D.   On: 09/14/2022 12:00   US Paracentesis  Result Date: 09/07/2022 INDICATION: Ascites, alcoholic  cirrhosis of liver EXAM: ULTRASOUND GUIDED DIAGNOSTIC AND THERAPEUTIC PARACENTESIS MEDICATIONS: None. COMPLICATIONS: None immediate. PROCEDURE: Informed written consent was obtained from the patient after a discussion of the risks, benefits and alternatives to treatment. A timeout was performed prior to the initiation of the procedure. Initial ultrasound scanning demonstrates a large amount of ascites within the right lower abdominal quadrant. The right lower abdomen was prepped and draped in the usual sterile fashion. 1% lidocaine was used for local anesthesia. Following this, a 19 gauge, 7-cm, Yueh catheter was introduced. An ultrasound image was saved for documentation purposes. The paracentesis was performed. The catheter was removed and a dressing was applied. The patient tolerated the procedure well without immediate post procedural complication. FINDINGS: A total of approximately 4 L of slightly hazy yellow ascitic fluid was removed. Samples were sent to the laboratory as requested by the clinical team. IMPRESSION: Successful ultrasound-guided paracentesis yielding 4 L liters of peritoneal fluid. Electronically Signed   By: Lavonia Dana M.D.   On: 09/07/2022 10:30    Orson Eva, DO  [facility]  If 7PM-7AM, please contact night-coverage [email or web address] Password [email or web address] 09/27/2022, 3:33 PM   LOS: 1 day

## 2022-09-27 NOTE — Assessment & Plan Note (Addendum)
Check VBG--7.34/27/31/14 Start po bicarb Due to acute on chronic renal failure

## 2022-09-27 NOTE — Progress Notes (Signed)
Patients spouse wondering about patient receiving paracentesis due to patient having them done outpatient. MD Tat made aware. New order placed for patient to have paracentesis done tomorrow 09/28/2022.

## 2022-09-27 NOTE — Progress Notes (Signed)
Lab called critical lab pO2, venous 31. MD Tat made aware.

## 2022-09-27 NOTE — Progress Notes (Signed)
Noted patient blood pressure 92/56, patient stated blood pressure normally runs low. MD Tat made aware.

## 2022-09-27 NOTE — Progress Notes (Signed)
Initial Nutrition Assessment  DOCUMENTATION CODES:   Not applicable  INTERVENTION:  Encourage adequate PO intake  Ensure Enlive po TID, each supplement provides 350 kcal and 20 grams of protein. MVI with minerals daily "Low Sodium Nutrition Therapy" handout added to AVS  NUTRITION DIAGNOSIS:   Increased nutrient needs related to chronic illness as evidenced by estimated needs.  GOAL:   Patient will meet greater than or equal to 90% of their needs  MONITOR:   PO intake, Supplement acceptance, Labs, Diet advancement, Weight trends  REASON FOR ASSESSMENT:   Malnutrition Screening Tool    ASSESSMENT:   Pt admitted with n/v and abnormal labs secondary to acute renal failure superimposed on CKD 3b. PMH significant for alcoholic cirrhosis, COPD, T2DM, peripheral neuropathy, HTN and HLD.  Per review of chart, pt has chronic nausea and vomiting at least once per day. Occasionally he goes 3-4 days without emesis. He has had intractable emesis since 10/15 (~2-4 episodes per day).   Pt being evaluated for liver transplant. Pt has outpatient paracentesis. Last completed 10/13- 4L removed. Pt reports plans for another paracentesis tomorrow.   Spoke with patient via phone call to room. He reports feeling better today. Denies having nausea or emesis today. At home he recalls eating about 3 meals per day. He denies any changes in his PO intake despite having episodes of emesis which he reports began about a week ago. He did not provide a dietary recall but reports eating "junk food."   He endorses very minimal weight loss of about 5 lbs. Per review of chart, pt's weight has been trending down within the last year. Suspect weight loss associated with multiple paracentesis since January in addition to episodes of intermittent nausea/emesis.   Medications: folvite, lactulose, MVI, zofran, protonix, klor-con, thiamine IV drips: NaCl with KCl @75ml /hr   Labs: potassium 2.9 (L), BUN 51, Cr 3.50,  corrected calcium 8.3, GFR 20  NUTRITION - FOCUSED PHYSICAL EXAM: RD working remotely. Deferred to follow up.   Diet Order:   Diet Order             Diet full liquid Room service appropriate? Yes; Fluid consistency: Thin  Diet effective now                   EDUCATION NEEDS:   Education needs have been addressed  Skin:  Skin Assessment: Reviewed RN Assessment  Last BM:  10/18  Height:   Ht Readings from Last 1 Encounters:  09/26/22 6' (1.829 m)    Weight:   Wt Readings from Last 1 Encounters:  09/26/22 102.3 kg    Ideal Body Weight:  80.9 kg  BMI:  Body mass index is 30.59 kg/m.  Estimated Nutritional Needs:   Kcal:  5537-4827  Protein:  115-130g  Fluid:  >/=2L  Clayborne Dana, RDN, LDN Clinical Nutrition

## 2022-09-27 NOTE — Discharge Instructions (Signed)

## 2022-09-28 ENCOUNTER — Other Ambulatory Visit: Payer: Self-pay

## 2022-09-28 ENCOUNTER — Encounter (HOSPITAL_COMMUNITY): Payer: Self-pay | Admitting: Internal Medicine

## 2022-09-28 ENCOUNTER — Inpatient Hospital Stay (HOSPITAL_COMMUNITY): Payer: Managed Care, Other (non HMO)

## 2022-09-28 ENCOUNTER — Telehealth: Payer: Self-pay | Admitting: Internal Medicine

## 2022-09-28 DIAGNOSIS — K7031 Alcoholic cirrhosis of liver with ascites: Secondary | ICD-10-CM | POA: Diagnosis not present

## 2022-09-28 DIAGNOSIS — N179 Acute kidney failure, unspecified: Secondary | ICD-10-CM | POA: Diagnosis not present

## 2022-09-28 DIAGNOSIS — E876 Hypokalemia: Secondary | ICD-10-CM | POA: Diagnosis not present

## 2022-09-28 DIAGNOSIS — E119 Type 2 diabetes mellitus without complications: Secondary | ICD-10-CM | POA: Diagnosis not present

## 2022-09-28 LAB — BASIC METABOLIC PANEL
Anion gap: 8 (ref 5–15)
BUN: 50 mg/dL — ABNORMAL HIGH (ref 6–20)
CO2: 12 mmol/L — ABNORMAL LOW (ref 22–32)
Calcium: 8.7 mg/dL — ABNORMAL LOW (ref 8.9–10.3)
Chloride: 119 mmol/L — ABNORMAL HIGH (ref 98–111)
Creatinine, Ser: 2.88 mg/dL — ABNORMAL HIGH (ref 0.61–1.24)
GFR, Estimated: 25 mL/min — ABNORMAL LOW (ref >60)
Glucose, Bld: 143 mg/dL — ABNORMAL HIGH (ref 70–99)
Potassium: 3.8 mmol/L (ref 3.5–5.1)
Sodium: 139 mmol/L (ref 135–145)

## 2022-09-28 LAB — MAGNESIUM: Magnesium: 2 mg/dL (ref 1.7–2.4)

## 2022-09-28 LAB — HEPATIC FUNCTION PANEL
ALT: 23 U/L (ref 0–44)
AST: 45 U/L — ABNORMAL HIGH (ref 15–41)
Albumin: 3.3 g/dL — ABNORMAL LOW (ref 3.5–5.0)
Alkaline Phosphatase: 128 U/L — ABNORMAL HIGH (ref 38–126)
Bilirubin, Direct: 0.3 mg/dL — ABNORMAL HIGH (ref 0.0–0.2)
Indirect Bilirubin: 0.8 mg/dL (ref 0.3–0.9)
Total Bilirubin: 1.1 mg/dL (ref 0.3–1.2)
Total Protein: 5.6 g/dL — ABNORMAL LOW (ref 6.5–8.1)

## 2022-09-28 LAB — PROTIME-INR
INR: 3.1 — ABNORMAL HIGH (ref 0.8–1.2)
Prothrombin Time: 32 seconds — ABNORMAL HIGH (ref 11.4–15.2)

## 2022-09-28 MED ORDER — ALBUMIN HUMAN 25 % IV SOLN
50.0000 g | Freq: Once | INTRAVENOUS | Status: AC
  Administered 2022-09-28: 50 g via INTRAVENOUS

## 2022-09-28 MED ORDER — ALBUMIN HUMAN 25 % IV SOLN
25.0000 g | Freq: Four times a day (QID) | INTRAVENOUS | Status: DC
  Administered 2022-09-28 – 2022-09-29 (×2): 25 g via INTRAVENOUS
  Filled 2022-09-28 (×3): qty 100

## 2022-09-28 MED ORDER — SODIUM CHLORIDE 0.9 % IV BOLUS
1000.0000 mL | Freq: Once | INTRAVENOUS | Status: AC
  Administered 2022-09-28: 1000 mL via INTRAVENOUS

## 2022-09-28 MED ORDER — ALBUMIN HUMAN 25 % IV SOLN
25.0000 g | Freq: Four times a day (QID) | INTRAVENOUS | Status: DC
  Filled 2022-09-28: qty 100

## 2022-09-28 MED ORDER — POTASSIUM CHLORIDE IN NACL 40-0.9 MEQ/L-% IV SOLN: INTRAVENOUS | Status: DC

## 2022-09-28 NOTE — Progress Notes (Signed)
Patient tolerated left sided paracentesis procedure well today and 8.2 Liters of yellow ascites removed. PT verbalized understanding of post procedure instructions and transported via wheelchair back to inpatient bed assignment at this time with no acute distress noted. Vital signs within normal limits.

## 2022-09-28 NOTE — Progress Notes (Signed)
PROGRESS NOTE  Bradley Miles QPR:[id number] DOB: September 15, 1966 DOA: 09/26/2022 PCP: [name], MD  Brief History:  56 year old male with a history of alcoholic cirrhosis, COPD, diabetes mellitus type 2, peripheral neuropathy, hypertension, hyperlipidemia presenting with nausea and vomiting and abnormal labs.  The patient states that he normally has chronic nausea and vomiting at least once per day.  Occasionally he goes 3 to 4 days without any emesis.  However, the patient has had intractable emesis since 09/23/2022, approximately 2-4 episodes per day.  He denies any hematemesis or coffee-ground emesis.  He denies any new medications.  He denies any NSAIDs.  He does not drink or use any illicit substances.  He is currently being evaluated at [facility] to be placed on their liver transplant list.  Notably, the patient was recently admitted to [facility] from 08/28/2022 to 09/05/2022.  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  At the time of discharge, he was instructed to stop taking his spironolactone and Bumex.  He states that he has not restarted these.  He continues to be compliant with his lactulose and rifaximin.  He and his spouse deny any mentation changes.  He denies any fevers, chills, chest pain, shortness breath, cough, hemoptysis, abdominal pain.  He has not had any hematochezia or melena.  His last paracentesis was on 09/21/2022 removed 4 L. The patient was called by his PCP because of abnormal labs.  He was noted to have an elevated serum creatinine.  He was told to report to the emergency department. In the ED, the patient was afebrile and hemodynamically stable with oxygen saturation 100% on room air.  BMP showed a sodium 136, potassium 2.6, bicarbonate 14, serum creatinine 3.6.  AST 43, ALT 25, alkaline phosphatase 172, total bilirubin 2.1, lipase 71.  WBC 5.4, hemoglobin  10.9, platelets 113,000.  The patient was started on IV fluids and admitted for further evaluation and treatment.     Assessment and Plan: * Acute renal failure superimposed on stage 3b chronic kidney disease, unspecified acute renal failure type (HCC) Baseline creatinine 1.9-2.3 There was serum creatinine 3.86 Secondary to volume depletion and hemodynamic changes Renal US--no hydronephrosis Continue IV fluids>>serum creatinine improving Give albumin x 4>>repeat after paracentesis  Metabolic acidosis Check LDJ--5.70/17/79/39 Start po bicarb Due to acute on chronic renal failure  Hypokalemia Repleted Check mag 1.8 --give additional KCl --add KCl to IVF  Intractable nausea and vomiting Start Zofran around-the-clock Has been chronic--suspect portal HTN gastropathy Continue pantoprazole twice daily Advance to low sodium diet  Controlled type 2 diabetes mellitus without complication, without long-term current use of insulin (HCC) Currently not on any agents Monitor CBGs intermittently 10/18 A1C--6.2  Esophageal varices ([place]) 08/19/2022 EGD--G2 esophageal varices, type II gastroesophageal varices without bleeding; presence of previous 2 scars concerning for previous bleed. 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 Repeat US in April 2023 show two thrombosed TIPS -Patient had life-threatening GI bleed with hemorrhagic shock 11/2019   COPD (chronic obstructive pulmonary disease) (HCC) Stable on room air  Alcoholic cirrhosis of liver with ascites ([place]) Liver transplant work-up was initiated during his hospitalization at [facility] 08/28/2022-19/17/23 MRI abdomen was performed and noted: findings suggestive of long-standing occlusion of the main portal vein,  SMV and splenic vein. There may be small caliber patent left portal vein entering  the liver. Abdominal collateral vessels present. -Did not feel anticoagulation was needed at that time Continue Lactulose and Xifaxin 10/19 MELD 26 09/28/22 paracentesis>8L  Mixed hyperlipidemia No longer taking a statin     Family Communication:   spouse updated at bedside 10/20  Consultants:  none  Code Status:  FULL   DVT Prophylaxis:  SCDs   Procedures: As Listed in Progress Note Above  Antibiotics: None      Subjective: Patient denies fevers, chills, headache, chest pain, dyspnea, nausea, vomiting, diarrhea, abdominal pain, dysuria, hematuria, hematochezia, and melena.   Objective: Vitals:   09/28/22 0950 09/28/22 1000 09/28/22 1010 09/28/22 1331  BP: 118/74 118/73 123/74 (!) 91/49  Pulse: 89 90 89 91  Resp: 18 18 18 18   Temp:    98.2 F (36.8 C)  TempSrc:    Oral  SpO2: 100% 100% 100% 100%  Weight:      Height:        Intake/Output Summary (Last 24 hours) at 09/28/2022 1641 Last data filed at 09/28/2022 0900 Gross per 24 hour  Intake 840 ml  Output --  Net 840 ml   Weight change:  Exam:  General:  Pt is alert, follows commands appropriately, not in acute distress HEENT: No icterus, No thrush, No neck mass, [place]/AT Cardiovascular: RRR, S1/S2, no rubs, no gallops Respiratory: CTA bilaterally, no wheezing, no crackles, no rhonchi Abdomen: Soft/+BS, non tender, non distended, no guarding Extremities: No edema, No lymphangitis, No petechiae, No rashes, no synovitis   Data Reviewed: I have personally reviewed following labs and imaging studies Basic Metabolic Panel: Recent Labs  Lab 09/26/22 1244 09/27/22 0533 09/28/22 0524  NA 136 136 139  K 2.6* 2.9* 3.8  CL 112* 116* 119*  CO2 14* 12* 12*  GLUCOSE 164* 157* 143*  BUN 55* 51* 50*  CREATININE 3.86* 3.50* 2.88*  CALCIUM 9.3 8.3* 8.7*  MG  --  1.8 2.0   Liver Function Tests: Recent Labs  Lab 09/26/22 1244 09/28/22 1532  AST 43* 45*  ALT 25 23  ALKPHOS 172* 128*  BILITOT 2.1*  1.1  PROT 7.1 5.6*  ALBUMIN 3.6 3.3*   Recent Labs  Lab 09/26/22 1244  LIPASE 71*   No results for input(s): "AMMONIA" in the last 168 hours. Coagulation Profile: Recent Labs  Lab 09/27/22 0533 09/28/22 1532  INR 1.5* 3.1*   CBC: Recent Labs  Lab 09/26/22 1244  WBC 5.4  HGB 10.9*  HCT 31.7*  MCV 104.3*  PLT 113*   Cardiac Enzymes: No results for input(s): "CKTOTAL", "CKMB", "CKMBINDEX", "TROPONINI" in the last 168 hours. BNP: Invalid input(s): "POCBNP" CBG: No results for input(s): "GLUCAP" in the last 168 hours. HbA1C: No results for input(s): "HGBA1C" in the last 72 hours. Urine analysis:    Component Value Date/Time   COLORURINE YELLOW 09/26/2022 1810   APPEARANCEUR CLEAR 09/26/2022 1810   LABSPEC 1.012 09/26/2022 1810   PHURINE 6.0 09/26/2022 1810   GLUCOSEU NEGATIVE 09/26/2022 1810   HGBUR NEGATIVE 09/26/2022 1810   BILIRUBINUR NEGATIVE 09/26/2022 1810   KETONESUR NEGATIVE 09/26/2022 1810   PROTEINUR NEGATIVE 09/26/2022 1810   NITRITE NEGATIVE 09/26/2022 1810   LEUKOCYTESUR NEGATIVE 09/26/2022 1810   Sepsis Labs: @LABRCNTIP (procalcitonin:4,lacticidven:4) ) Recent Results (from the past 240 hour(s))  Gram stain     Status: None   Collection Time: 09/21/22  9:37 AM   Specimen: Peritoneal Washings  Result Value Ref  Range Status   Specimen Description PERITONEAL  Final   Special Requests NONE  Final   Gram Stain   Final    CYTOSPIN SMEAR NO ORGANISMS SEEN WBC PRESENT, PREDOMINANTLY MONONUCLEAR Performed at [facility], [address] [place], [place] [address]    Report Status 09/21/2022 FINAL  Final  Culture, body fluid w Gram Stain-bottle     Status: None   Collection Time: 09/21/22  9:37 AM   Specimen: Peritoneal Washings  Result Value Ref Range Status   Specimen Description PERITONEAL  Final   Special Requests 10CC  Final   Culture   Final    NO GROWTH 5 DAYS Performed at [facility], [address], [place], [place] [address]     Report Status 09/26/2022 FINAL  Final     Scheduled Meds:  feeding supplement  237 mL Oral TID BM   folic acid  1 mg Oral Daily   lactulose  30 g Oral Q6H   multivitamin with minerals  1 tablet Oral Daily   ondansetron (ZOFRAN) IV  4 mg Intravenous Q6H   pantoprazole (PROTONIX) IV  40 mg Intravenous Q12H   PARoxetine  40 mg Oral Daily   rifaximin  550 mg Oral BID   sodium bicarbonate  1,300 mg Oral BID   thiamine  100 mg Oral Daily   Continuous Infusions:  0.9 % NaCl with KCl 40 mEq / L Stopped (09/28/22 1446)   albumin human      Procedures/Studies: US Paracentesis  Result Date: 09/28/2022 INDICATION: Cirrhosis EXAM: ULTRASOUND GUIDED  PARACENTESIS MEDICATIONS: None. COMPLICATIONS: None immediate. PROCEDURE: Informed written consent was obtained from the patient after a discussion of the risks, benefits and alternatives to treatment. A timeout was performed prior to the initiation of the procedure. Initial ultrasound scanning demonstrates a large amount of ascites within the left mid abdominal quadrant. The abdomen was prepped and draped in the usual sterile fashion. 1% lidocaine was used for local anesthesia. Following this, a 6 Fr Safe-T-Centesis catheter was introduced. An ultrasound image was saved for documentation purposes. The paracentesis was performed. The catheter was removed and a dressing was applied. The patient tolerated the procedure well without immediate post procedural complication. FINDINGS: A total of approximately 8.2 L of serous fluid was removed. IMPRESSION: Successful ultrasound-guided paracentesis yielding 8.2 liters of peritoneal fluid. Electronically Signed   By: Abigail Miyamoto M.D.   On: 09/28/2022 10:31   US RENAL  Result Date: 09/27/2022 CLINICAL DATA:  Acute renal failure.  Chronic kidney disease EXAM: RENAL / URINARY TRACT ULTRASOUND COMPLETE COMPARISON:  02/08/2021 FINDINGS: Right Kidney: Renal measurements: 10.7 x 5.0 x 4.8 cm = volume: 134 mL. Slightly  increased cortical echogenicity. No mass or hydronephrosis visualized. Left Kidney: Renal measurements: 11.7 x 6.2 x 5.5 cm = volume: 208 mL. Slightly increased cortical echogenicity. No mass or hydronephrosis visualized. Bladder: Appears normal for degree of bladder distention. Other: Moderate volume ascites. IMPRESSION: 1. No evidence of obstructive uropathy. 2. Slightly increased cortical echogenicity of the bilateral kidneys as can be seen with chronic medical renal disease. 3. Moderate volume ascites. Electronically Signed   By: Davina Poke D.O.   On: 09/27/2022 12:01   US Paracentesis  Result Date: 09/21/2022 INDICATION: Alcoholic cirrhosis, ascites EXAM: ULTRASOUND GUIDED DIAGNOSTIC AND THERAPEUTIC PARACENTESIS MEDICATIONS: None. COMPLICATIONS: None immediate. PROCEDURE: Informed written consent was obtained from the patient after a discussion of the risks, benefits and alternatives to treatment. A timeout was performed prior to the initiation of  the procedure. Initial ultrasound scanning demonstrates a large amount of ascites within the LEFT lower abdominal quadrant. The right lower abdomen was prepped and draped in the usual sterile fashion. 1% lidocaine was used for local anesthesia. Following this, a 19 gauge, 7-cm, Yueh catheter was introduced. An ultrasound image was saved for documentation purposes. The paracentesis was performed. The catheter was removed and a dressing was applied. The patient tolerated the procedure well without immediate post procedural complication. Patient received post-procedure intravenous albumin; see nursing notes for details. FINDINGS: A total of approximately 4.0 L of yellow ascitic fluid was removed. Samples were sent to the laboratory as requested by the clinical team. IMPRESSION: Successful ultrasound-guided paracentesis yielding 4.0 liters of peritoneal fluid. Electronically Signed   By: Lavonia Dana M.D.   On: 09/21/2022 12:04   US Paracentesis  Result  Date: 09/14/2022 INDICATION: Alcoholic cirrhosis, ascites EXAM: ULTRASOUND GUIDED DIAGNOSTIC AND THERAPEUTIC PARACENTESIS MEDICATIONS: None. COMPLICATIONS: None immediate. PROCEDURE: Informed written consent was obtained from the patient after a discussion of the risks, benefits and alternatives to treatment. A timeout was performed prior to the initiation of the procedure. Initial ultrasound scanning demonstrates a large amount of ascites within the LEFT lower abdominal quadrant. The right lower abdomen was prepped and draped in the usual sterile fashion. 1% lidocaine was used for local anesthesia. Following this, a 19 gauge, 7-cm, Yueh catheter was introduced. An ultrasound image was saved for documentation purposes. The paracentesis was performed. The catheter was removed and a dressing was applied. The patient tolerated the procedure well without immediate post procedural complication. Patient received post-procedure intravenous albumin; see nursing notes for details. FINDINGS: A total of approximately 4.2 L of yellow slightly cloudy ascitic fluid was removed. Samples were sent to the laboratory as requested by the clinical team. IMPRESSION: Successful ultrasound-guided paracentesis yielding 4.2 liters of peritoneal fluid. Electronically Signed   By: Lavonia Dana M.D.   On: 09/14/2022 12:00   US Paracentesis  Result Date: 09/07/2022 INDICATION: Ascites, alcoholic cirrhosis of liver EXAM: ULTRASOUND GUIDED DIAGNOSTIC AND THERAPEUTIC PARACENTESIS MEDICATIONS: None. COMPLICATIONS: None immediate. PROCEDURE: Informed written consent was obtained from the patient after a discussion of the risks, benefits and alternatives to treatment. A timeout was performed prior to the initiation of the procedure. Initial ultrasound scanning demonstrates a large amount of ascites within the right lower abdominal quadrant. The right lower abdomen was prepped and draped in the usual sterile fashion. 1% lidocaine was used for local  anesthesia. Following this, a 19 gauge, 7-cm, Yueh catheter was introduced. An ultrasound image was saved for documentation purposes. The paracentesis was performed. The catheter was removed and a dressing was applied. The patient tolerated the procedure well without immediate post procedural complication. FINDINGS: A total of approximately 4 L of slightly hazy yellow ascitic fluid was removed. Samples were sent to the laboratory as requested by the clinical team. IMPRESSION: Successful ultrasound-guided paracentesis yielding 4 L liters of peritoneal fluid. Electronically Signed   By: Lavonia Dana M.D.   On: 09/07/2022 10:30    Orson Eva, DO  [facility]  If 7PM-7AM, please contact night-coverage [email or web address] Password [email or web address] 09/28/2022, 4:41 PM   LOS: 2 days

## 2022-09-28 NOTE — Telephone Encounter (Signed)
Spouse came by office  Patient will be dc from hospital tomorrow   Toc appt made  Toc call needed     Also request lab orders be put in per hospitalist for  Kidney , liver and I&R   Wants a call back when lab orders are put in  Will use [facility] on [address]

## 2022-09-29 DIAGNOSIS — K7031 Alcoholic cirrhosis of liver with ascites: Secondary | ICD-10-CM | POA: Diagnosis not present

## 2022-09-29 DIAGNOSIS — E119 Type 2 diabetes mellitus without complications: Secondary | ICD-10-CM | POA: Diagnosis not present

## 2022-09-29 DIAGNOSIS — N179 Acute kidney failure, unspecified: Secondary | ICD-10-CM | POA: Diagnosis not present

## 2022-09-29 DIAGNOSIS — E876 Hypokalemia: Secondary | ICD-10-CM | POA: Diagnosis not present

## 2022-09-29 LAB — COMPREHENSIVE METABOLIC PANEL
ALT: 23 U/L (ref 0–44)
AST: 41 U/L (ref 15–41)
Albumin: 3.8 g/dL (ref 3.5–5.0)
Alkaline Phosphatase: 115 U/L (ref 38–126)
Anion gap: 6 (ref 5–15)
BUN: 40 mg/dL — ABNORMAL HIGH (ref 6–20)
CO2: 12 mmol/L — ABNORMAL LOW (ref 22–32)
Calcium: 8.9 mg/dL (ref 8.9–10.3)
Chloride: 120 mmol/L — ABNORMAL HIGH (ref 98–111)
Creatinine, Ser: 2.31 mg/dL — ABNORMAL HIGH (ref 0.61–1.24)
GFR, Estimated: 33 mL/min — ABNORMAL LOW (ref >60)
Glucose, Bld: 157 mg/dL — ABNORMAL HIGH (ref 70–99)
Potassium: 3.4 mmol/L — ABNORMAL LOW (ref 3.5–5.1)
Sodium: 138 mmol/L (ref 135–145)
Total Bilirubin: 1.1 mg/dL (ref 0.3–1.2)
Total Protein: 6 g/dL — ABNORMAL LOW (ref 6.5–8.1)

## 2022-09-29 LAB — PROTIME-INR
INR: 1.7 — ABNORMAL HIGH (ref 0.8–1.2)
Prothrombin Time: 19.3 seconds — ABNORMAL HIGH (ref 11.4–15.2)

## 2022-09-29 MED ORDER — POTASSIUM CHLORIDE CRYS ER 20 MEQ PO TBCR
20.0000 meq | EXTENDED_RELEASE_TABLET | Freq: Every day | ORAL | Status: DC
  Administered 2022-09-29: 20 meq via ORAL
  Filled 2022-09-29: qty 1

## 2022-09-29 MED ORDER — POTASSIUM CHLORIDE CRYS ER 20 MEQ PO TBCR: 20.0000 meq | EXTENDED_RELEASE_TABLET | Freq: Every day | ORAL | Status: DC

## 2022-09-29 MED ORDER — SODIUM BICARBONATE 650 MG PO TABS: 1300.0000 mg | ORAL_TABLET | Freq: Two times a day (BID) | ORAL | 1 refills | Status: DC

## 2022-09-29 NOTE — Progress Notes (Signed)
Patient being discharged, removed 20g IV from right hand. Clean, dry and intact. Waiting for discharge paper work.    Nursing Instructor concurs with the above documentation.

## 2022-09-29 NOTE — Progress Notes (Signed)
Ng Discharge Note  Admit Date:  09/26/2022 Discharge date: 09/29/2022   PATON CRUM to be D/C'd Home per MD order.  AVS completed. Patient/caregiver able to verbalize understanding.  Discharge Medication: Allergies as of 09/29/2022   No Known Allergies      Medication List     STOP taking these medications    amoxicillin-clavulanate 500-125 MG tablet Commonly known as: AUGMENTIN       TAKE these medications    allopurinol 100 MG tablet Commonly known as: ZYLOPRIM Take 1 tablet (100 mg total) by mouth daily.   folic acid 1 MG tablet Commonly known as: FOLVITE Take 1 mg by mouth daily.   lactulose 10 GM/15ML solution Commonly known as: CHRONULAC Take 45 mLs (30 g total) by mouth every 6 (six) hours. Titrate as needed to have 2-4 bowel movements daily.   omeprazole 20 MG capsule Commonly known as: PRILOSEC Take 20 mg by mouth daily.   ondansetron 8 MG tablet Commonly known as: ZOFRAN Take 1 tablet (8 mg total) by mouth every 8 (eight) hours as needed for nausea or vomiting.   PARoxetine 40 MG tablet Commonly known as: PAXIL Take 1 tablet (40 mg total) by mouth daily.   potassium chloride SA 20 MEQ tablet Commonly known as: KLOR-CON M Take 1 tablet (20 mEq total) by mouth daily.   sodium bicarbonate 650 MG tablet Take 2 tablets (1,300 mg total) by mouth 2 (two) times daily.   Thera Tabs Take 1 tablet by mouth daily.   thiamine 100 MG tablet Commonly known as: VITAMIN B1 Take 100 mg by mouth daily.   Xifaxan 550 MG Tabs tablet Generic drug: rifaximin TAKE 1 TABLET(550 MG) BY MOUTH TWICE DAILY   zinc sulfate 220 (50 Zn) MG capsule Take 50 mg by mouth daily.        Discharge Assessment: Vitals:   09/28/22 1953 09/29/22 0338  BP: 112/64 115/63  Pulse: 96 99  Resp: 20 14  Temp: (!) 97.2 F (36.2 C) 98 F (36.7 C)  SpO2: 100% 100%   Skin clean, dry and intact without evidence of skin break down, no evidence of skin tears noted. IV  catheter discontinued intact. Site without signs and symptoms of complications - no redness or edema noted at insertion site, patient denies c/o pain - only slight tenderness at site.  Dressing with slight pressure applied.  D/c Instructions-Education: Discharge instructions given to patient/family with verbalized understanding. D/c education completed with patient/family including follow up instructions, medication list, d/c activities limitations if indicated, with other d/c instructions as indicated by MD - patient able to verbalize understanding, all questions fully answered. Patient instructed to return to ED, call 911, or call MD for any changes in condition.  Patient escorted via [place], and D/C home via private auto.  Tsosie Billing, LPN 09/64/3838 18:40 AM

## 2022-09-29 NOTE — Progress Notes (Signed)
Other than L FA IV infiltrating with IV KCL running, patient had uneventful night. Bradley Miles

## 2022-09-29 NOTE — Discharge Summary (Signed)
Physician Discharge Summary   Patient: Bradley Miles MRN: [id number] DOB: 1966-04-10  Admit date:     09/26/2022  Discharge date: 09/29/22  Discharge Physician: Shanon Brow Taeveon Keesling   PCP: [name], MD   Recommendations at discharge:   Please follow up with primary care provider within 1-2 weeks  Please repeat BMP and CBC in one week    Hospital Course: 56 year old male with a history of alcoholic cirrhosis, COPD, diabetes mellitus type 2, peripheral neuropathy, hypertension, hyperlipidemia presenting with nausea and vomiting and abnormal labs.  The patient states that he normally has chronic nausea and vomiting at least once per day.  Occasionally he goes 3 to 4 days without any emesis.  However, the patient has had intractable emesis since 09/23/2022, approximately 2-4 episodes per day.  He denies any hematemesis or coffee-ground emesis.  He denies any new medications.  He denies any NSAIDs.  He does not drink or use any illicit substances.  He is currently being evaluated at [facility] to be placed on their liver transplant list.  Notably, the patient was recently admitted to [facility] from 08/28/2022 to 09/05/2022.  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  At the time of discharge, he was instructed to stop taking his spironolactone and Bumex.  He states that he has not restarted these.  He continues to be compliant with his lactulose and rifaximin.  He and his spouse deny any mentation changes.  He denies any fevers, chills, chest pain, shortness breath, cough, hemoptysis, abdominal pain.  He has not had any hematochezia or melena.  His last paracentesis was on 09/21/2022 removed 4 L. The patient was called by his PCP because of abnormal labs.  He was noted to have an elevated serum creatinine.  He was told to report to the emergency department. In the ED, the patient was afebrile and  hemodynamically stable with oxygen saturation 100% on room air.  BMP showed a sodium 136, potassium 2.6, bicarbonate 14, serum creatinine 3.6.  AST 43, ALT 25, alkaline phosphatase 172, total bilirubin 2.1, lipase 71.  WBC 5.4, hemoglobin 10.9, platelets 113,000.  The patient was started on IV fluids and admitted for further evaluation and treatment.  Assessment and Plan: * Acute renal failure superimposed on stage 3b chronic kidney disease, unspecified acute renal failure type (HCC) Baseline creatinine 1.9-2.3 Presented with serum creatinine 3.86 Secondary to volume depletion and hemodynamic changes Renal US--no hydronephrosis Continue IV fluids>>serum creatinine improving Give albumin x 4>>repeat after paracentesis Serum creatinine 2.31 on day of dc  Metabolic acidosis Check UDJ--4.97/02/63/78 NAG due to loose stools from lactulose Started po bicarb Due to acute on chronic renal failure  Hypokalemia Repleted Check mag 2.0 --given additional KCl --add KCl to IVF --d/c home with KCl 20 daily.  Repeat BMP 1 week after d/c  Intractable nausea and vomiting Start Zofran around-the-clock Has been chronic--suspect portal HTN gastropathy Continue pantoprazole twice daily Overall improved Advance to low sodium diet>>tolerating diet  Controlled type 2 diabetes mellitus without complication, without long-term current use of insulin (HCC) Currently not on any agents Monitor CBGs intermittently 10/18 A1C--6.2  Esophageal varices ([place]) 08/19/2022 EGD--G2 esophageal varices, type II gastroesophageal varices without bleeding; presence of previous 2 scars concerning for previous bleed. 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 Repeat US in April 2023 show two thrombosed TIPS -Patient had life-threatening GI bleed with hemorrhagic shock  11/2019   COPD (chronic obstructive pulmonary disease) (HCC) Stable on room air  Alcoholic cirrhosis of liver with ascites ([place]) Liver transplant work-up was initiated during his hospitalization at [facility] 08/28/2022-19/17/23 MRI abdomen was performed and noted: findings suggestive of long-standing occlusion of the main portal vein, SMV and splenic vein. There may be small caliber patent left portal vein entering the liver. Abdominal collateral vessels present. -Did not feel anticoagulation was needed at that time Continue Lactulose and Xifaxin 10/21 MELD 21 (range 21-26 during hospitalization) 09/28/22 paracentesis>8L  Mixed hyperlipidemia No longer taking a statin         Consultants: none Procedures performed: none  Disposition: Home Diet recommendation:  Cardiac diet DISCHARGE MEDICATION: Allergies as of 09/29/2022   No Known Allergies      Medication List     STOP taking these medications    amoxicillin-clavulanate 500-125 MG tablet Commonly known as: AUGMENTIN       TAKE these medications    allopurinol 100 MG tablet Commonly known as: ZYLOPRIM Take 1 tablet (100 mg total) by mouth daily.   folic acid 1 MG tablet Commonly known as: FOLVITE Take 1 mg by mouth daily.   lactulose 10 GM/15ML solution Commonly known as: CHRONULAC Take 45 mLs (30 g total) by mouth every 6 (six) hours. Titrate as needed to have 2-4 bowel movements daily.   omeprazole 20 MG capsule Commonly known as: PRILOSEC Take 20 mg by mouth daily.   ondansetron 8 MG tablet Commonly known as: ZOFRAN Take 1 tablet (8 mg total) by mouth every 8 (eight) hours as needed for nausea or vomiting.   PARoxetine 40 MG tablet Commonly known as: PAXIL Take 1 tablet (40 mg total) by mouth daily.   potassium chloride SA 20 MEQ tablet Commonly known as: KLOR-CON M Take 1 tablet (20 mEq total) by mouth daily.   sodium bicarbonate 650 MG tablet Take 2 tablets (1,300 mg total) by mouth 2 (two)  times daily.   Thera Tabs Take 1 tablet by mouth daily.   thiamine 100 MG tablet Commonly known as: VITAMIN B1 Take 100 mg by mouth daily.   Xifaxan 550 MG Tabs tablet Generic drug: rifaximin TAKE 1 TABLET(550 MG) BY MOUTH TWICE DAILY   zinc sulfate 220 (50 Zn) MG capsule Take 50 mg by mouth daily.        Discharge Exam: Filed Weights   09/26/22 1143 09/26/22 2020  Weight: 104.3 kg 102.3 kg   HEENT:  [place]/AT, No thrush, no icterus CV:  RRR, no rub, no S3, no S4 Lung:  CTA, no wheeze, no rhonchi Abd:  soft/+BS, NT Ext:  No edema, no lymphangitis, no synovitis, no rash   Condition at discharge: stable  The results of significant diagnostics from this hospitalization (including imaging, microbiology, ancillary and laboratory) are listed below for reference.   Imaging Studies: US Paracentesis  Result Date: 09/28/2022 INDICATION: Cirrhosis EXAM: ULTRASOUND GUIDED  PARACENTESIS MEDICATIONS: None. COMPLICATIONS: None immediate. PROCEDURE: Informed written consent was obtained from the patient after a discussion of the risks, benefits and alternatives to treatment. A timeout was performed prior to the initiation of the procedure. Initial ultrasound scanning demonstrates a large amount of ascites within the left mid abdominal quadrant. The abdomen was prepped and draped in the usual sterile fashion. 1% lidocaine was used for local anesthesia. Following this, a 6 Fr Safe-T-Centesis catheter was introduced.  An ultrasound image was saved for documentation purposes. The paracentesis was performed. The catheter was removed and a dressing was applied. The patient tolerated the procedure well without immediate post procedural complication. FINDINGS: A total of approximately 8.2 L of serous fluid was removed. IMPRESSION: Successful ultrasound-guided paracentesis yielding 8.2 liters of peritoneal fluid. Electronically Signed   By: Abigail Miyamoto M.D.   On: 09/28/2022 10:31   US RENAL  Result  Date: 09/27/2022 CLINICAL DATA:  Acute renal failure.  Chronic kidney disease EXAM: RENAL / URINARY TRACT ULTRASOUND COMPLETE COMPARISON:  02/08/2021 FINDINGS: Right Kidney: Renal measurements: 10.7 x 5.0 x 4.8 cm = volume: 134 mL. Slightly increased cortical echogenicity. No mass or hydronephrosis visualized. Left Kidney: Renal measurements: 11.7 x 6.2 x 5.5 cm = volume: 208 mL. Slightly increased cortical echogenicity. No mass or hydronephrosis visualized. Bladder: Appears normal for degree of bladder distention. Other: Moderate volume ascites. IMPRESSION: 1. No evidence of obstructive uropathy. 2. Slightly increased cortical echogenicity of the bilateral kidneys as can be seen with chronic medical renal disease. 3. Moderate volume ascites. Electronically Signed   By: Davina Poke D.O.   On: 09/27/2022 12:01   US Paracentesis  Result Date: 09/21/2022 INDICATION: Alcoholic cirrhosis, ascites EXAM: ULTRASOUND GUIDED DIAGNOSTIC AND THERAPEUTIC PARACENTESIS MEDICATIONS: None. COMPLICATIONS: None immediate. PROCEDURE: Informed written consent was obtained from the patient after a discussion of the risks, benefits and alternatives to treatment. A timeout was performed prior to the initiation of the procedure. Initial ultrasound scanning demonstrates a large amount of ascites within the LEFT lower abdominal quadrant. The right lower abdomen was prepped and draped in the usual sterile fashion. 1% lidocaine was used for local anesthesia. Following this, a 19 gauge, 7-cm, Yueh catheter was introduced. An ultrasound image was saved for documentation purposes. The paracentesis was performed. The catheter was removed and a dressing was applied. The patient tolerated the procedure well without immediate post procedural complication. Patient received post-procedure intravenous albumin; see nursing notes for details. FINDINGS: A total of approximately 4.0 L of yellow ascitic fluid was removed. Samples were sent to the  laboratory as requested by the clinical team. IMPRESSION: Successful ultrasound-guided paracentesis yielding 4.0 liters of peritoneal fluid. Electronically Signed   By: Lavonia Dana M.D.   On: 09/21/2022 12:04   US Paracentesis  Result Date: 09/14/2022 INDICATION: Alcoholic cirrhosis, ascites EXAM: ULTRASOUND GUIDED DIAGNOSTIC AND THERAPEUTIC PARACENTESIS MEDICATIONS: None. COMPLICATIONS: None immediate. PROCEDURE: Informed written consent was obtained from the patient after a discussion of the risks, benefits and alternatives to treatment. A timeout was performed prior to the initiation of the procedure. Initial ultrasound scanning demonstrates a large amount of ascites within the LEFT lower abdominal quadrant. The right lower abdomen was prepped and draped in the usual sterile fashion. 1% lidocaine was used for local anesthesia. Following this, a 19 gauge, 7-cm, Yueh catheter was introduced. An ultrasound image was saved for documentation purposes. The paracentesis was performed. The catheter was removed and a dressing was applied. The patient tolerated the procedure well without immediate post procedural complication. Patient received post-procedure intravenous albumin; see nursing notes for details. FINDINGS: A total of approximately 4.2 L of yellow slightly cloudy ascitic fluid was removed. Samples were sent to the laboratory as requested by the clinical team. IMPRESSION: Successful ultrasound-guided paracentesis yielding 4.2 liters of peritoneal fluid. Electronically Signed   By: Lavonia Dana M.D.   On: 09/14/2022 12:00   US Paracentesis  Result Date: 09/07/2022 INDICATION: Ascites, alcoholic cirrhosis of liver EXAM: ULTRASOUND  GUIDED DIAGNOSTIC AND THERAPEUTIC PARACENTESIS MEDICATIONS: None. COMPLICATIONS: None immediate. PROCEDURE: Informed written consent was obtained from the patient after a discussion of the risks, benefits and alternatives to treatment. A timeout was performed prior to the  initiation of the procedure. Initial ultrasound scanning demonstrates a large amount of ascites within the right lower abdominal quadrant. The right lower abdomen was prepped and draped in the usual sterile fashion. 1% lidocaine was used for local anesthesia. Following this, a 19 gauge, 7-cm, Yueh catheter was introduced. An ultrasound image was saved for documentation purposes. The paracentesis was performed. The catheter was removed and a dressing was applied. The patient tolerated the procedure well without immediate post procedural complication. FINDINGS: A total of approximately 4 L of slightly hazy yellow ascitic fluid was removed. Samples were sent to the laboratory as requested by the clinical team. IMPRESSION: Successful ultrasound-guided paracentesis yielding 4 L liters of peritoneal fluid. Electronically Signed   By: Lavonia Dana M.D.   On: 09/07/2022 10:30    Microbiology: Results for orders placed or performed during the hospital encounter of 09/21/22  Gram stain     Status: None   Collection Time: 09/21/22  9:37 AM   Specimen: Peritoneal Washings  Result Value Ref Range Status   Specimen Description PERITONEAL  Final   Special Requests NONE  Final   Gram Stain   Final    CYTOSPIN SMEAR NO ORGANISMS SEEN WBC PRESENT, PREDOMINANTLY MONONUCLEAR Performed at [facility], [address] [place], [place] [address]    Report Status 09/21/2022 FINAL  Final  Culture, body fluid w Gram Stain-bottle     Status: None   Collection Time: 09/21/22  9:37 AM   Specimen: Peritoneal Washings  Result Value Ref Range Status   Specimen Description PERITONEAL  Final   Special Requests 10CC  Final   Culture   Final    NO GROWTH 5 DAYS Performed at [facility], [address], [place],  [address]    Report Status 09/26/2022 FINAL  Final    Labs: CBC: Recent Labs  Lab 09/26/22 1244  WBC 5.4  HGB 10.9*  HCT 31.7*  MCV 104.3*  PLT 536*   Basic Metabolic Panel: Recent Labs  Lab  09/26/22 1244 09/27/22 0533 09/28/22 0524 09/29/22 0526  NA 136 136 139 138  K 2.6* 2.9* 3.8 3.4*  CL 112* 116* 119* 120*  CO2 14* 12* 12* 12*  GLUCOSE 164* 157* 143* 157*  BUN 55* 51* 50* 40*  CREATININE 3.86* 3.50* 2.88* 2.31*  CALCIUM 9.3 8.3* 8.7* 8.9  MG  --  1.8 2.0  --    Liver Function Tests: Recent Labs  Lab 09/26/22 1244 09/28/22 1532 09/29/22 0526  AST 43* 45* 41  ALT 25 23 23   ALKPHOS 172* 128* 115  BILITOT 2.1* 1.1 1.1  PROT 7.1 5.6* 6.0*  ALBUMIN 3.6 3.3* 3.8   CBG: No results for input(s): "GLUCAP" in the last 168 hours.  Discharge time spent: greater than 30 minutes.  Signed: Orson Eva, MD [facility] 09/29/2022

## 2022-09-29 NOTE — Progress Notes (Signed)
Patient being discharged, removed 20g IV from right hand. Clean, dry and intact. Waiting for discharge paper work.

## 2022-10-01 ENCOUNTER — Telehealth: Payer: Self-pay

## 2022-10-01 NOTE — Telephone Encounter (Signed)
Transition Care Management Follow-up Telephone Call Date of discharge and from where: 10/21 Bradley [facility] How have you been since you were released from the hospital? Weak and sluggish Any questions or concerns? No  Items Reviewed: Did the pt receive and understand the discharge instructions provided? Yes  Medications obtained and verified? Yes  Other? No  Any new allergies since your discharge? No  Dietary orders reviewed? Yes Do you have support at home? Yes   Home Care and Equipment/Supplies: Were home health services ordered? no If so, what is the name of the agency? N/A  Has the agency set up a time to come to the patient's home? no Were any new equipment or medical supplies ordered?  No What is the name of the medical supply agency? N/A Were you able to get the supplies/equipment? not applicable Do you have any questions related to the use of the equipment or supplies? No  Functional Questionnaire: (I = Independent and D = Dependent) ADLs: I  Bathing/Dressing- I  Meal Prep- I  Eating- I  Maintaining continence- I  Transferring/Ambulation- I  Managing Meds- I  Follow up appointments reviewed:  PCP Hospital f/u appt confirmed? Yes  Scheduled to see Dr. Doren Custard on 10/04/2022 @ 11:00. [facility] f/u appt confirmed? No   Are transportation arrangements needed? No  If their condition worsens, is the pt aware to call PCP or go to the Emergency Dept.? Yes Was the patient provided with contact information for the PCP's office or ED? Yes Was to pt encouraged to call back with questions or concerns? Yes

## 2022-10-01 NOTE — Telephone Encounter (Signed)
TOC call made

## 2022-10-03 LAB — BODY FLUID CELL COUNT WITH DIFFERENTIAL
Eos, Fluid: 0 %
Lymphs, Fluid: 13 %
Monocyte-Macrophage-Serous Fluid: 84 % (ref 50–90)
Neutrophil Count, Fluid: 3 % (ref 0–25)
Total Nucleated Cell Count, Fluid: 107 cu mm (ref 0–1000)

## 2022-10-04 ENCOUNTER — Encounter: Payer: Self-pay | Admitting: Internal Medicine

## 2022-10-04 ENCOUNTER — Ambulatory Visit: Payer: Managed Care, Other (non HMO) | Admitting: Internal Medicine

## 2022-10-04 VITALS — BP 120/72 | HR 117 | Ht 72.0 in | Wt 230.2 lb

## 2022-10-04 DIAGNOSIS — R112 Nausea with vomiting, unspecified: Secondary | ICD-10-CM

## 2022-10-04 DIAGNOSIS — N1832 Chronic kidney disease, stage 3b: Secondary | ICD-10-CM

## 2022-10-04 DIAGNOSIS — F32A Depression, unspecified: Secondary | ICD-10-CM | POA: Diagnosis not present

## 2022-10-04 DIAGNOSIS — N179 Acute kidney failure, unspecified: Secondary | ICD-10-CM

## 2022-10-04 DIAGNOSIS — K7031 Alcoholic cirrhosis of liver with ascites: Secondary | ICD-10-CM

## 2022-10-04 MED ORDER — ONDANSETRON HCL 8 MG PO TABS: 8.0000 mg | ORAL_TABLET | Freq: Three times a day (TID) | ORAL | 0 refills | Status: DC | PRN

## 2022-10-04 MED ORDER — PAROXETINE HCL 40 MG PO TABS: 40.0000 mg | ORAL_TABLET | Freq: Every day | ORAL | 1 refills | Status: DC

## 2022-10-04 NOTE — Patient Instructions (Signed)
It was a pleasure to see you today.  Thank you for giving [place] the opportunity to be involved in your care.  Below is a brief recap of your visit and next steps.  We will plan to see you again on 12/7  Summary No medication changes today I have refilled Paxil and Zofran We will repeat MELD labs today

## 2022-10-04 NOTE — Progress Notes (Signed)
Established Patient Office Visit  Subjective   Patient ID: Bradley Miles, male    DOB: 1966-10-31  Age: 56 y.o. MRN: [id number]  Chief Complaint  Patient presents with   Transitions Of Care    D/c 10/21   Bradley Miles presents for transition of care appointment today.  He is a 56 year old male with a past medical history significant for alcoholic cirrhosis complicated by esophageal varices, Hg, and portal gastropathy, HTN, COPD, GERD, T2DM, peripheral neuropathy, and anxiety/depression.  He was admitted to [facility] 10/18 - 10/21 for acute renal failure superimposed on CKD stage IIIb.  His creatinine was 3.86 at the time of admission, which was elevated from baseline 1.9-2.3.  He was treated with IV fluids and albumin and his creatinine improved to 2.31 on the day of discharge.  1 week follow-up was arranged for repeat labs.  Today Bradley Miles states that he feels well overall.  He continues to endorse nausea with vomiting, noting 1 episode of vomiting daily.  He requests refills of Zofran and Paxil today.  Of note, his PHQ-9 score is elevated (10).  He attributes this to his current medical conditions.  He states that Paxil is effective and he has establish care with counseling as well.  Bradley Miles is accompanied by his wife today, who has no additional concerns aside from wanting to ensure repeat labs are obtained.  Past Medical History:  Diagnosis Date   Alcoholic cirrhosis ([place])    Anxiety    Controlled type 2 diabetes mellitus with complication, without long-term current use of insulin ([place]) 06/26/2020   Enlarged liver    GERD (gastroesophageal reflux disease)    Hypertension    Palpitations    PVC's (premature ventricular contractions)    Shortness of breath    Tussive syncope 12/22/2019   Varicose veins    Past Surgical History:  Procedure Laterality Date   BIOPSY  02/02/2019   Procedure: BIOPSY;  Surgeon: Daneil Dolin, MD;  [facility];  Service:  Endoscopy;;  gastric   COLONOSCOPY WITH ESOPHAGOGASTRODUODENOSCOPY (EGD)  12/16/2012   internal hemorrhoids, colonic diverticulosis, benign polyps, screening in 2024. EGD with mild erosive reflux esophagitis, small hiatal hernia, negative H.pylori   COLONOSCOPY WITH PROPOFOL N/A 06/22/2021   Procedure: COLONOSCOPY WITH PROPOFOL;  Surgeon: Daneil Dolin, MD;  [facility];  Service: Endoscopy;  Laterality: N/A;  1:45pm   cyst reomved     from spine   ESOPHAGOGASTRODUODENOSCOPY  05/19/09   RMR: Geographic distal esophageal erosions consistent with severe erosive reflux esophagitis. schatzki's ring s/P dilation/small hiatal hernia otherwise normal stomach   ESOPHAGOGASTRODUODENOSCOPY (EGD) WITH PROPOFOL N/A 02/02/2019   Dr. Gala Romney: retained gastric contents. portal HTN gastropathy   ESOPHAGOGASTRODUODENOSCOPY (EGD) WITH PROPOFOL N/A 11/20/2019   Procedure: ESOPHAGOGASTRODUODENOSCOPY (EGD) WITH PROPOFOL;  Surgeon: Daneil Dolin, MD;  [facility];  Service: Endoscopy;  Laterality: N/A;   ESOPHAGOGASTRODUODENOSCOPY (EGD) WITH PROPOFOL N/A 11/24/2019   Procedure: ESOPHAGOGASTRODUODENOSCOPY (EGD) WITH PROPOFOL;  Surgeon: Ronnette Juniper, MD;  Location: [place];  Service: Gastroenterology;  Laterality: N/A;   ESOPHAGOGASTRODUODENOSCOPY (EGD) WITH PROPOFOL N/A 08/08/2020   Procedure: ESOPHAGOGASTRODUODENOSCOPY (EGD) WITH PROPOFOL;  Surgeon: Daneil Dolin, MD;  [facility];  Service: Endoscopy;  Laterality: N/A;   ESOPHAGOGASTRODUODENOSCOPY (EGD) WITH PROPOFOL N/A 11/15/2020   Procedure: ESOPHAGOGASTRODUODENOSCOPY (EGD) WITH PROPOFOL;  Surgeon: Ladene Artist, MD;  Location: [facility];  Service: Endoscopy;  Laterality: N/A;   ESOPHAGOGASTRODUODENOSCOPY (EGD) WITH PROPOFOL  N/A 08/19/2022   Procedure: ESOPHAGOGASTRODUODENOSCOPY (EGD) WITH PROPOFOL;  Surgeon: Harvel Quale, MD;  [facility];  Service: Gastroenterology;  Laterality: N/A;   IR  ANGIOGRAM SELECTIVE EACH ADDITIONAL VESSEL  11/22/2019   IR ANGIOGRAM SELECTIVE EACH ADDITIONAL VESSEL  11/24/2019   IR ANGIOGRAM SELECTIVE EACH ADDITIONAL VESSEL  11/24/2019   IR ANGIOGRAM SELECTIVE EACH ADDITIONAL VESSEL  11/24/2019   IR ANGIOGRAM SELECTIVE EACH ADDITIONAL VESSEL  11/15/2020   IR EMBO ART  VEN HEMORR LYMPH EXTRAV  INC GUIDE ROADMAPPING  11/22/2019   IR EMBO ART  VEN HEMORR LYMPH EXTRAV  INC GUIDE ROADMAPPING  11/24/2019   IR EMBO ART  VEN HEMORR LYMPH EXTRAV  INC GUIDE ROADMAPPING  11/15/2020   IR FLUORO GUIDE CV LINE RIGHT  11/15/2020   IR RADIOLOGIST EVAL & MGMT  12/29/2019   IR RADIOLOGIST EVAL & MGMT  10/27/2020   IR RADIOLOGIST EVAL & MGMT  04/19/2021   IR TIPS  11/24/2019   IR US GUIDE VASC ACCESS RIGHT  11/22/2019   IR US GUIDE VASC ACCESS RIGHT  11/15/2020   IR US GUIDE VASC ACCESS RIGHT  11/15/2020   IR VENOGRAM RENAL UNI LEFT  11/22/2019   IR VENOGRAM RENAL UNI LEFT  11/15/2020   POLYPECTOMY  06/22/2021   Procedure: POLYPECTOMY;  Surgeon: Daneil Dolin, MD;  [facility];  Service: Endoscopy;;   RADIOLOGY WITH ANESTHESIA N/A 11/24/2019   Procedure: IR WITH ANESTHESIA;  Surgeon: Radiologist, Medication, MD;  Location: [place];  Service: Radiology;  Laterality: N/A;   RADIOLOGY WITH ANESTHESIA N/A 11/15/2020   Procedure: IR WITH ANESTHESIA;  Surgeon: Suzette Battiest, MD;  Location: [place];  Service: Radiology;  Laterality: N/A;   TIPS PROCEDURE N/A 11/22/2019   Procedure: BRTO/TRANS-JUGULAR INTRAHEPATIC PORTAL SHUNT (TIPS);  Surgeon: Corrie Mckusick, DO;  Location: [place];  Service: Anesthesiology;  Laterality: N/A;   WISDOM TOOTH EXTRACTION     Social History   Tobacco Use   Smoking status: Every Day    Packs/day: 1.00    Years: 30.00    Total pack years: 30.00    Types: Cigarettes    Start date: 12/24/1979   Smokeless tobacco: Never  Vaping Use   Vaping Use: Never used  Substance Use Topics   Alcohol use: Yes    Comment: none recently   Drug use: No    Family History  Problem Relation Age of Onset   Cancer Mother        ? etiology   Heart disease Mother    Colon polyps Sister 55       two sisters   Brain cancer Father    Liver disease Neg Hx    No Known Allergies  Review of Systems  Gastrointestinal:  Positive for nausea and vomiting.  All other systems reviewed and are negative.    Objective:     BP 120/72   Pulse (!) 117   Ht 6' (1.829 m)   Wt 230 lb 3.2 oz (104.4 kg)   SpO2 98%   BMI 31.22 kg/m  BP Readings from Last 3 Encounters:  10/05/22 123/71  10/04/22 120/72  09/29/22 115/63   Physical Exam Vitals reviewed.  Constitutional:      General: He is not in acute distress.    Appearance: Normal appearance. He is not ill-appearing.  HENT:     Head: Normocephalic and atraumatic.     Nose: Nose normal. No congestion or rhinorrhea.  Mouth/Throat:     Mouth: Mucous membranes are moist.     Pharynx: Oropharynx is clear.  Eyes:     Extraocular Movements: Extraocular movements intact.     Conjunctiva/sclera: Conjunctivae normal.     Pupils: Pupils are equal, round, and reactive to light.  Cardiovascular:     Rate and Rhythm: Normal rate and regular rhythm.     Pulses: Normal pulses.     Heart sounds: Normal heart sounds. No murmur heard. Pulmonary:     Effort: Pulmonary effort is normal.     Breath sounds: Normal breath sounds. No wheezing, rhonchi or rales.  Abdominal:     General: Abdomen is flat. Bowel sounds are normal. There is no distension.     Palpations: Abdomen is soft.     Tenderness: There is no abdominal tenderness.     Comments: Positive fluid wave  Musculoskeletal:        General: No swelling or deformity. Normal range of motion.     Cervical back: Normal range of motion.  Skin:    General: Skin is warm and dry.     Capillary Refill: Capillary refill takes less than 2 seconds.     Coloration: Skin is not jaundiced.  Neurological:     General: No focal deficit present.     Mental  Status: He is alert and oriented to person, place, and time.     Motor: No weakness.     Comments: No asterixis appreciated on exam  Psychiatric:        Mood and Affect: Mood normal.        Behavior: Behavior normal.        Thought Content: Thought content normal.    Last CBC Lab Results  Component Value Date   WBC 5.4 09/26/2022   HGB 10.9 (L) 09/26/2022   HCT 31.7 (L) 09/26/2022   MCV 104.3 (H) 09/26/2022   MCH 35.9 (H) 09/26/2022   RDW 14.2 09/26/2022   PLT 113 (L) 10/11/1116   Last metabolic panel Lab Results  Component Value Date   GLUCOSE 162 (H) 10/09/2022   NA 133 (L) 10/09/2022   K 3.4 (L) 10/09/2022   CL 101 10/09/2022   CO2 WILL FOLLOW 10/09/2022   BUN 46 (H) 10/09/2022   CREATININE 4.47 (H) 10/09/2022   EGFR 15 (L) 10/09/2022   CALCIUM 9.4 10/09/2022   PHOS 2.4 (L) 11/19/2020   PROT 6.7 10/09/2022   ALBUMIN 3.8 10/09/2022   LABGLOB 2.9 10/09/2022   AGRATIO 1.3 10/09/2022   BILITOT 1.5 (H) 10/09/2022   ALKPHOS 198 (H) 10/09/2022   AST 39 10/09/2022   ALT 20 10/09/2022   ANIONGAP 6 09/29/2022   Last lipids Lab Results  Component Value Date   CHOL 216 (H) 10/24/2020   HDL 70 10/24/2020   LDLCALC 121 (H) 10/24/2020   TRIG 91 11/18/2020   CHOLHDL 3.1 10/24/2020   Last hemoglobin A1c Lab Results  Component Value Date   HGBA1C 6.2 (H) 09/25/2022   Last thyroid functions Lab Results  Component Value Date   TSH 9.034 (H) 06/01/2018   Last vitamin B12 and Folate Lab Results  Component Value Date   VITAMINB12 555 08/17/2022   FOLATE 28.9 08/17/2022    The 10-year ASCVD risk score (Arnett DK, et al., 2019) is: 13%    Assessment & Plan:   Problem List Items Addressed This Visit       Alcoholic cirrhosis of liver with ascites ([place]) - Primary  He is followed by [facility] transplant and has follow-up scheduled for next month.  Repeat MELD-Na labs ordered today.  MELD 21 prior to discharge.  Continue lactulose and Xifaxan.      Intractable  nausea and vomiting    Chronic issue.  He frequently requires Zofran for symptom relief.  Suspect this is due to portal hypertensive gastropathy.  Zofran refilled today.      Acute renal failure superimposed on stage 3b chronic kidney disease, unspecified acute renal failure type [facility]    Admitted to [facility] 10/18-10/21 for acute renal failure.  Creatinine 3.86 at the time of admission.  Baseline typically 1.9-2.3.  Treated with IV fluids and albumin.  Creatinine improved to 2.31 on the day of discharge. -Repeat labs ordered today      Depression    Currently prescribed Paxil.  PHQ-9 score elevated today.  He attributes this to his recent hospital admission and current medical conditions.  Denies SI/HI.  He also sees a Social worker. -Paxil refilled today       Return in about 6 weeks (around 11/15/2022).    [name], MD

## 2022-10-05 ENCOUNTER — Telehealth: Payer: Self-pay | Admitting: Gastroenterology

## 2022-10-05 ENCOUNTER — Ambulatory Visit (HOSPITAL_COMMUNITY)
Admission: RE | Admit: 2022-10-05 | Discharge: 2022-10-05 | Disposition: A | Discharge status: Home / Self Care | Payer: Managed Care, Other (non HMO) | Source: Ambulatory Visit | Attending: Gastroenterology

## 2022-10-05 ENCOUNTER — Encounter (HOSPITAL_COMMUNITY): Payer: Self-pay

## 2022-10-05 DIAGNOSIS — R188 Other ascites: Secondary | ICD-10-CM

## 2022-10-05 LAB — GRAM STAIN

## 2022-10-05 LAB — BODY FLUID CELL COUNT WITH DIFFERENTIAL
Eos, Fluid: 0 %
Lymphs, Fluid: 9 %
Monocyte-Macrophage-Serous Fluid: 90 % (ref 50–90)
Neutrophil Count, Fluid: 1 % (ref 0–25)
Total Nucleated Cell Count, Fluid: 82 cu mm (ref 0–1000)

## 2022-10-05 MED ORDER — ALBUMIN HUMAN 25 % IV SOLN: 25.0000 g | Freq: Once | INTRAVENOUS | Status: AC

## 2022-10-05 MED ORDER — ALBUMIN HUMAN 25 % IV SOLN
INTRAVENOUS | Status: AC
  Administered 2022-10-05: 25 g via INTRAVENOUS
  Filled 2022-10-05: qty 100

## 2022-10-05 NOTE — Telephone Encounter (Signed)
We can adjust patients paracentesis standing order to remove no more than 6 L of ascites with each para. Should get 6-8g of IV albumin for each liter removed. Same labs: cytology, cell count with diff, culture.

## 2022-10-05 NOTE — Procedures (Signed)
PreOperative Dx: Cirrhosis, ascites Postoperative Dx: Cirrhosis, ascites Procedure:   US guided paracentesis Radiologist:  Thornton Papas Anesthesia:  10 ml of1% lidocaine Specimen:  6.0 L of cloudy yellow ascitic fluid EBL:   < 1 ml Complications:  None

## 2022-10-05 NOTE — Progress Notes (Addendum)
Paracentesis complete no signs of distress. 50 grams of IV Albumin given.

## 2022-10-06 LAB — PROTIME-INR
INR: 1.1 (ref 0.9–1.2)
Prothrombin Time: 11.9 s (ref 9.1–12.0)

## 2022-10-06 LAB — CMP14+EGFR
ALT: 23 IU/L (ref 0–44)
AST: 38 IU/L (ref 0–40)
Albumin/Globulin Ratio: 1.5 (ref 1.2–2.2)
Albumin: 3.9 g/dL (ref 3.8–4.9)
Alkaline Phosphatase: 191 IU/L — ABNORMAL HIGH (ref 44–121)
BUN/Creatinine Ratio: 13 (ref 9–20)
BUN: 38 mg/dL — ABNORMAL HIGH (ref 6–24)
Bilirubin Total: 1.5 mg/dL — ABNORMAL HIGH (ref 0.0–1.2)
CO2: 12 mmol/L — ABNORMAL LOW (ref 20–29)
Calcium: 9.4 mg/dL (ref 8.7–10.2)
Chloride: 103 mmol/L (ref 96–106)
Creatinine, Ser: 2.84 mg/dL — ABNORMAL HIGH (ref 0.76–1.27)
Globulin, Total: 2.6 g/dL (ref 1.5–4.5)
Glucose: 170 mg/dL — ABNORMAL HIGH (ref 70–99)
Potassium: 3.5 mmol/L (ref 3.5–5.2)
Sodium: 133 mmol/L — ABNORMAL LOW (ref 134–144)
Total Protein: 6.5 g/dL (ref 6.0–8.5)
eGFR: 26 mL/min/{1.73_m2} — ABNORMAL LOW (ref >59)

## 2022-10-08 ENCOUNTER — Other Ambulatory Visit: Payer: Self-pay

## 2022-10-08 DIAGNOSIS — K7031 Alcoholic cirrhosis of liver with ascites: Secondary | ICD-10-CM

## 2022-10-08 NOTE — Addendum Note (Signed)
Addended by: Cheron Every on: 10/08/2022 08:33 AM   Modules accepted: Orders

## 2022-10-08 NOTE — Telephone Encounter (Signed)
Update the standing order and faxed.

## 2022-10-08 NOTE — Telephone Encounter (Signed)
Orders updated

## 2022-10-10 ENCOUNTER — Encounter (HOSPITAL_COMMUNITY): Payer: Self-pay | Admitting: Emergency Medicine

## 2022-10-10 ENCOUNTER — Inpatient Hospital Stay (HOSPITAL_COMMUNITY)
Admission: EM | Admit: 2022-10-10 | Discharge: 2022-10-15 | DRG: 683 | Disposition: A | Discharge status: Home / Self Care | Payer: Managed Care, Other (non HMO) | Attending: Internal Medicine | Admitting: Internal Medicine

## 2022-10-10 ENCOUNTER — Other Ambulatory Visit: Payer: Self-pay

## 2022-10-10 DIAGNOSIS — R112 Nausea with vomiting, unspecified: Secondary | ICD-10-CM | POA: Diagnosis present

## 2022-10-10 DIAGNOSIS — E872 Acidosis, unspecified: Secondary | ICD-10-CM | POA: Diagnosis present

## 2022-10-10 DIAGNOSIS — K7031 Alcoholic cirrhosis of liver with ascites: Secondary | ICD-10-CM | POA: Diagnosis not present

## 2022-10-10 DIAGNOSIS — E876 Hypokalemia: Secondary | ICD-10-CM | POA: Diagnosis present

## 2022-10-10 DIAGNOSIS — K3189 Other diseases of stomach and duodenum: Secondary | ICD-10-CM | POA: Diagnosis present

## 2022-10-10 DIAGNOSIS — E119 Type 2 diabetes mellitus without complications: Secondary | ICD-10-CM

## 2022-10-10 DIAGNOSIS — I129 Hypertensive chronic kidney disease with stage 1 through stage 4 chronic kidney disease, or unspecified chronic kidney disease: Secondary | ICD-10-CM | POA: Diagnosis present

## 2022-10-10 DIAGNOSIS — R110 Nausea: Secondary | ICD-10-CM | POA: Diagnosis not present

## 2022-10-10 DIAGNOSIS — J449 Chronic obstructive pulmonary disease, unspecified: Secondary | ICD-10-CM | POA: Diagnosis present

## 2022-10-10 DIAGNOSIS — Z8249 Family history of ischemic heart disease and other diseases of the circulatory system: Secondary | ICD-10-CM

## 2022-10-10 DIAGNOSIS — K7682 Hepatic encephalopathy: Secondary | ICD-10-CM | POA: Diagnosis present

## 2022-10-10 DIAGNOSIS — I8510 Secondary esophageal varices without bleeding: Secondary | ICD-10-CM | POA: Diagnosis present

## 2022-10-10 DIAGNOSIS — Z808 Family history of malignant neoplasm of other organs or systems: Secondary | ICD-10-CM

## 2022-10-10 DIAGNOSIS — N179 Acute kidney failure, unspecified: Secondary | ICD-10-CM | POA: Diagnosis not present

## 2022-10-10 DIAGNOSIS — K766 Portal hypertension: Secondary | ICD-10-CM | POA: Diagnosis present

## 2022-10-10 DIAGNOSIS — K219 Gastro-esophageal reflux disease without esophagitis: Secondary | ICD-10-CM | POA: Diagnosis present

## 2022-10-10 DIAGNOSIS — E1142 Type 2 diabetes mellitus with diabetic polyneuropathy: Secondary | ICD-10-CM | POA: Diagnosis present

## 2022-10-10 DIAGNOSIS — Z79899 Other long term (current) drug therapy: Secondary | ICD-10-CM

## 2022-10-10 DIAGNOSIS — Z83719 Family history of colon polyps, unspecified: Secondary | ICD-10-CM

## 2022-10-10 DIAGNOSIS — D631 Anemia in chronic kidney disease: Secondary | ICD-10-CM | POA: Diagnosis present

## 2022-10-10 DIAGNOSIS — E1122 Type 2 diabetes mellitus with diabetic chronic kidney disease: Secondary | ICD-10-CM | POA: Diagnosis present

## 2022-10-10 DIAGNOSIS — F17210 Nicotine dependence, cigarettes, uncomplicated: Secondary | ICD-10-CM | POA: Diagnosis present

## 2022-10-10 DIAGNOSIS — E785 Hyperlipidemia, unspecified: Secondary | ICD-10-CM | POA: Diagnosis present

## 2022-10-10 DIAGNOSIS — N1832 Acute kidney failure, unspecified: Secondary | ICD-10-CM | POA: Diagnosis present

## 2022-10-10 LAB — CBC WITH DIFFERENTIAL/PLATELET
Abs Immature Granulocytes: 0.02 10*3/uL (ref 0.00–0.07)
Basophils Absolute: 0 10*3/uL (ref 0.0–0.1)
Basophils Relative: 1 %
Eosinophils Absolute: 0.3 10*3/uL (ref 0.0–0.5)
Eosinophils Relative: 5 %
HCT: 28.1 % — ABNORMAL LOW (ref 39.0–52.0)
Hemoglobin: 9.8 g/dL — ABNORMAL LOW (ref 13.0–17.0)
Immature Granulocytes: 0 %
Lymphocytes Relative: 6 %
Lymphs Abs: 0.3 10*3/uL — ABNORMAL LOW (ref 0.7–4.0)
MCH: 36 pg — ABNORMAL HIGH (ref 26.0–34.0)
MCHC: 34.9 g/dL (ref 30.0–36.0)
MCV: 103.3 fL — ABNORMAL HIGH (ref 80.0–100.0)
Monocytes Absolute: 0.6 10*3/uL (ref 0.1–1.0)
Monocytes Relative: 12 %
Neutro Abs: 3.8 10*3/uL (ref 1.7–7.7)
Neutrophils Relative %: 76 %
Platelets: 105 10*3/uL — ABNORMAL LOW (ref 150–400)
RBC: 2.72 MIL/uL — ABNORMAL LOW (ref 4.22–5.81)
RDW: 14.3 % (ref 11.5–15.5)
WBC: 5 10*3/uL (ref 4.0–10.5)
nRBC: 0 % (ref 0.0–0.2)

## 2022-10-10 LAB — COMPREHENSIVE METABOLIC PANEL
ALT: 23 U/L (ref 0–44)
AST: 37 U/L (ref 15–41)
Albumin: 3.3 g/dL — ABNORMAL LOW (ref 3.5–5.0)
Alkaline Phosphatase: 152 U/L — ABNORMAL HIGH (ref 38–126)
Anion gap: 12 (ref 5–15)
BUN: 52 mg/dL — ABNORMAL HIGH (ref 6–20)
CO2: 13 mmol/L — ABNORMAL LOW (ref 22–32)
Calcium: 9 mg/dL (ref 8.9–10.3)
Chloride: 106 mmol/L (ref 98–111)
Creatinine, Ser: 4.37 mg/dL — ABNORMAL HIGH (ref 0.61–1.24)
GFR, Estimated: 15 mL/min — ABNORMAL LOW (ref >60)
Glucose, Bld: 291 mg/dL — ABNORMAL HIGH (ref 70–99)
Potassium: 3.2 mmol/L — ABNORMAL LOW (ref 3.5–5.1)
Sodium: 131 mmol/L — ABNORMAL LOW (ref 135–145)
Total Bilirubin: 1.9 mg/dL — ABNORMAL HIGH (ref 0.3–1.2)
Total Protein: 6.4 g/dL — ABNORMAL LOW (ref 6.5–8.1)

## 2022-10-10 LAB — CYTOLOGY - NON PAP

## 2022-10-10 LAB — CULTURE, BODY FLUID W GRAM STAIN -BOTTLE: Culture: NO GROWTH

## 2022-10-10 LAB — GLUCOSE, CAPILLARY: Glucose-Capillary: 158 mg/dL — ABNORMAL HIGH (ref 70–99)

## 2022-10-10 LAB — MAGNESIUM: Magnesium: 2 mg/dL (ref 1.7–2.4)

## 2022-10-10 LAB — PROTIME-INR
INR: 1.1 (ref 0.9–1.2)
Prothrombin Time: 11.7 s (ref 9.1–12.0)

## 2022-10-10 LAB — LIPASE, BLOOD: Lipase: 62 U/L — ABNORMAL HIGH (ref 11–51)

## 2022-10-10 MED ORDER — ONDANSETRON HCL 4 MG PO TABS
4.0000 mg | ORAL_TABLET | Freq: Four times a day (QID) | ORAL | Status: DC | PRN
  Administered 2022-10-11 (×2): 4 mg via ORAL
  Filled 2022-10-10 (×2): qty 1

## 2022-10-10 MED ORDER — PANTOPRAZOLE SODIUM 40 MG PO TBEC
40.0000 mg | DELAYED_RELEASE_TABLET | Freq: Two times a day (BID) | ORAL | Status: DC
  Administered 2022-10-10 – 2022-10-15 (×10): 40 mg via ORAL
  Filled 2022-10-10 (×10): qty 1

## 2022-10-10 MED ORDER — RIFAXIMIN 550 MG PO TABS
550.0000 mg | ORAL_TABLET | Freq: Two times a day (BID) | ORAL | Status: DC
  Administered 2022-10-10 – 2022-10-15 (×10): 550 mg via ORAL
  Filled 2022-10-10 (×10): qty 1

## 2022-10-10 MED ORDER — SODIUM CHLORIDE 0.9 % IV SOLN: INTRAVENOUS | Status: AC

## 2022-10-10 MED ORDER — ONDANSETRON HCL 4 MG/2ML IJ SOLN
4.0000 mg | Freq: Four times a day (QID) | INTRAMUSCULAR | Status: DC | PRN
  Administered 2022-10-10 – 2022-10-13 (×3): 4 mg via INTRAVENOUS
  Filled 2022-10-10 (×3): qty 2

## 2022-10-10 MED ORDER — INSULIN ASPART 100 UNIT/ML IJ SOLN: 0.0000 [IU] | Freq: Every day | INTRAMUSCULAR | Status: DC

## 2022-10-10 MED ORDER — LACTULOSE 10 GM/15ML PO SOLN
30.0000 g | Freq: Three times a day (TID) | ORAL | Status: DC
  Administered 2022-10-11 – 2022-10-15 (×13): 30 g via ORAL
  Filled 2022-10-10 (×14): qty 60

## 2022-10-10 MED ORDER — PAROXETINE HCL 20 MG PO TABS
40.0000 mg | ORAL_TABLET | Freq: Every day | ORAL | Status: DC
  Administered 2022-10-11 – 2022-10-15 (×5): 40 mg via ORAL
  Filled 2022-10-10 (×5): qty 2

## 2022-10-10 MED ORDER — ALLOPURINOL 100 MG PO TABS
100.0000 mg | ORAL_TABLET | Freq: Every day | ORAL | Status: DC
  Administered 2022-10-11 – 2022-10-15 (×5): 100 mg via ORAL
  Filled 2022-10-10 (×5): qty 1

## 2022-10-10 MED ORDER — SODIUM BICARBONATE 650 MG PO TABS
1300.0000 mg | ORAL_TABLET | Freq: Two times a day (BID) | ORAL | Status: DC
  Administered 2022-10-10 – 2022-10-15 (×10): 1300 mg via ORAL
  Filled 2022-10-10 (×10): qty 2

## 2022-10-10 MED ORDER — INSULIN ASPART 100 UNIT/ML IJ SOLN
0.0000 [IU] | Freq: Three times a day (TID) | INTRAMUSCULAR | Status: DC
  Administered 2022-10-11 (×2): 1 [IU] via SUBCUTANEOUS
  Administered 2022-10-11: 5 [IU] via SUBCUTANEOUS
  Administered 2022-10-12: 2 [IU] via SUBCUTANEOUS
  Administered 2022-10-12: 3 [IU] via SUBCUTANEOUS
  Administered 2022-10-12: 1 [IU] via SUBCUTANEOUS
  Administered 2022-10-13: 2 [IU] via SUBCUTANEOUS
  Administered 2022-10-13: 1 [IU] via SUBCUTANEOUS
  Administered 2022-10-13: 2 [IU] via SUBCUTANEOUS
  Administered 2022-10-14: 3 [IU] via SUBCUTANEOUS
  Administered 2022-10-14: 1 [IU] via SUBCUTANEOUS
  Administered 2022-10-14 – 2022-10-15 (×2): 2 [IU] via SUBCUTANEOUS

## 2022-10-10 MED ORDER — POTASSIUM CHLORIDE CRYS ER 20 MEQ PO TBCR
40.0000 meq | EXTENDED_RELEASE_TABLET | Freq: Once | ORAL | Status: AC
  Administered 2022-10-10: 40 meq via ORAL
  Filled 2022-10-10: qty 2

## 2022-10-10 NOTE — Assessment & Plan Note (Signed)
K- 3.2. - Replete - Check Mag

## 2022-10-10 NOTE — Assessment & Plan Note (Addendum)
Chronic.  Likely related to portal hypertensive gastropathy.  Recent EGD 08/20/2019-Grade II esophageal varices.Type 2 gastroesophageal varices. Portal hypertensive gastropathy.  No abdominal complaints.  No change in bowel habits. -Resume Protonix, Zofran as needed

## 2022-10-10 NOTE — Assessment & Plan Note (Signed)
Controlled.  A1c 6.2.  Not on medication. - SSi- S

## 2022-10-10 NOTE — Assessment & Plan Note (Addendum)
Alcoholic liver cirrhosis complicated by hepatic encephalopathy, portal hypertension and esophageal varices.  Hgb stable at 9.8.  Mental status stable, he is compliant with lactulose 3 times daily with resultant average of 3 bowel movements daily.  Gets frequent paracentesis.  Last paracentesis 10/27 at 10/20 yielding 6 and 8 L respectively.  Abdomen is distended but not to the level where he needs paracentesis at this time. -Resume Xifaxan and lactulose

## 2022-10-10 NOTE — Assessment & Plan Note (Signed)
Admitted to [facility] 10/18-10/21 for acute renal failure.  Creatinine 3.86 at the time of admission.  Baseline typically 1.9-2.3.  Treated with IV fluids and albumin.  Creatinine improved to 2.31 on the day of discharge. -Repeat labs ordered today

## 2022-10-10 NOTE — Assessment & Plan Note (Signed)
Chronic issue.  He frequently requires Zofran for symptom relief.  Suspect this is due to portal hypertensive gastropathy.  Zofran refilled today.

## 2022-10-10 NOTE — ED Notes (Signed)
Pt given a urinal and informed a urine sample is needed at this time.

## 2022-10-10 NOTE — Progress Notes (Signed)
   10/10/22 1843  Assess: MEWS Score  Temp 98.3 F (36.8 C)  BP 119/77  Pulse Rate (!) 113  Resp 16  Assess: MEWS Score  MEWS Temp 0  MEWS Systolic 0  MEWS Pulse 2  MEWS RR 0  MEWS LOC 0  MEWS Score 2  MEWS Score Color Yellow  Assess: if the MEWS score is Yellow or Red  Were vital signs taken at a resting state? Yes  Does the patient meet 2 or more of the SIRS criteria? No  MEWS guidelines implemented *See Row Information* Yes  Treat  MEWS Interventions Administered scheduled meds/treatments  Pain Score 0  Take Vital Signs  Increase Vital Sign Frequency  Yellow: Q 2hr X 2 then Q 4hr X 2, if remains yellow, continue Q 4hrs  Escalate  MEWS: Escalate Yellow: discuss with charge nurse/RN and consider discussing with provider and RRT  Notify: Charge Nurse/RN  Name of Charge Nurse/RN Notified Audrea Muscat  Date Charge Nurse/RN Notified 10/10/22  Time Charge Nurse/RN Notified 1903  Document  Patient Outcome Other (Comment) (Wife states pulse has been elevated lately.  Started on NS and gave scheduled potassium.  On tele)  Progress note created (see row info) Yes  Assess: SIRS CRITERIA  SIRS Temperature  0  SIRS Pulse 1  SIRS Respirations  0  SIRS WBC 1  SIRS Score Sum  2

## 2022-10-10 NOTE — Assessment & Plan Note (Addendum)
Currently prescribed Paxil.  PHQ-9 score elevated today.  He attributes this to his recent hospital admission and current medical conditions.  Denies SI/HI.  He also sees a Social worker. -Paxil refilled today

## 2022-10-10 NOTE — Assessment & Plan Note (Signed)
Chronic none anion gap metabolic acidosis at baseline.  Serum bicarb 15 today.  Likely from combination of renal insufficiency and bowel movements from lactulose. -Resume home bicarb

## 2022-10-10 NOTE — ED Triage Notes (Signed)
Was sent by pcp for creatinine of 4.4. pt denies any new symptoms. Chronic n/v, once pta. Chronic weakness which pt states is no worse than usual. A/o. Jaundiced in color. States has been slowly decreasing in Urinary op since last discharge.

## 2022-10-10 NOTE — H&P (Signed)
History and Physical    Bradley Miles EPP:[id number] DOB: 1966-08-18 DOA: 10/10/2022  PCP: [name], MD   Patient coming from: Home  I have personally briefly reviewed patient's old medical records in [place]  Chief Complaint: Abnormal labs  HPI: Bradley Miles is a 56 y.o. male with medical history significant for alcohol liver cirrhosis , hepatic encephalopathy and esophageal varices, COPD, hypertension. Patient presented to the ED with reports of abnormal labs.  He had blood work done by his primary care provider which showed elevated creatinine he was referred to the ED. Over the past at least 5 weeks patient has had poor oral intake, due to nausea and vomiting when he eats.  He reports he has a good appetite but vomits 2-3 times daily after eating.  No diarrhea.  No abdominal pain.  He has chronic abdominal distention for which he gets paracentesis quite frequently.  He has had recent large-volume paracentesis. 10/27  - 6 L 10/30  - 8.2 L 10/13  - 4 L  He is no longer on diuretics.  He has chronic intermittent dizziness when standing that is unchanged.  Recent hospitalization 10/18 to 10/21 - for same AKI on CKD, creatinine was elevated at 3.86 and improved with fluid, discharge creatinine was 2.3.  10/19 renal ultrasound negative for obstructive uropathy.  ED Course: Temperature 97.6.  Heart rate 100-118.  Respirate rate 18-20.  Blood pressure 115-139. Creatinine elevated at 4.37. Hospitalist admit for AKI on CKD 3b.  Review of Systems: As per HPI all other systems reviewed and negative.  Past Medical History:  Diagnosis Date   Alcoholic cirrhosis ([place])    Anxiety    Controlled type 2 diabetes mellitus with complication, without long-term current use of insulin ([place]) 06/26/2020   Enlarged liver    GERD (gastroesophageal reflux disease)    Hypertension    Palpitations    PVC's (premature ventricular contractions)    Shortness of breath    Tussive  syncope 12/22/2019   Varicose veins     Past Surgical History:  Procedure Laterality Date   BIOPSY  02/02/2019   Procedure: BIOPSY;  Surgeon: Daneil Dolin, MD;  [facility];  Service: Endoscopy;;  gastric   COLONOSCOPY WITH ESOPHAGOGASTRODUODENOSCOPY (EGD)  12/16/2012   internal hemorrhoids, colonic diverticulosis, benign polyps, screening in 2024. EGD with mild erosive reflux esophagitis, small hiatal hernia, negative H.pylori   COLONOSCOPY WITH PROPOFOL N/A 06/22/2021   Procedure: COLONOSCOPY WITH PROPOFOL;  Surgeon: Daneil Dolin, MD;  [facility];  Service: Endoscopy;  Laterality: N/A;  1:45pm   cyst reomved     from spine   ESOPHAGOGASTRODUODENOSCOPY  05/19/09   RMR: Geographic distal esophageal erosions consistent with severe erosive reflux esophagitis. schatzki's ring s/P dilation/small hiatal hernia otherwise normal stomach   ESOPHAGOGASTRODUODENOSCOPY (EGD) WITH PROPOFOL N/A 02/02/2019   Dr. Gala Romney: retained gastric contents. portal HTN gastropathy   ESOPHAGOGASTRODUODENOSCOPY (EGD) WITH PROPOFOL N/A 11/20/2019   Procedure: ESOPHAGOGASTRODUODENOSCOPY (EGD) WITH PROPOFOL;  Surgeon: Daneil Dolin, MD;  [facility];  Service: Endoscopy;  Laterality: N/A;   ESOPHAGOGASTRODUODENOSCOPY (EGD) WITH PROPOFOL N/A 11/24/2019   Procedure: ESOPHAGOGASTRODUODENOSCOPY (EGD) WITH PROPOFOL;  Surgeon: Ronnette Juniper, MD;  Location: [place];  Service: Gastroenterology;  Laterality: N/A;   ESOPHAGOGASTRODUODENOSCOPY (EGD) WITH PROPOFOL N/A 08/08/2020   Procedure: ESOPHAGOGASTRODUODENOSCOPY (EGD) WITH PROPOFOL;  Surgeon: Daneil Dolin, MD;  [facility];  Service: Endoscopy;  Laterality: N/A;   ESOPHAGOGASTRODUODENOSCOPY (EGD)  WITH PROPOFOL N/A 11/15/2020   Procedure: ESOPHAGOGASTRODUODENOSCOPY (EGD) WITH PROPOFOL;  Surgeon: Ladene Artist, MD;  Location: [facility];  Service: Endoscopy;  Laterality: N/A;   ESOPHAGOGASTRODUODENOSCOPY (EGD) WITH  PROPOFOL N/A 08/19/2022   Procedure: ESOPHAGOGASTRODUODENOSCOPY (EGD) WITH PROPOFOL;  Surgeon: Harvel Quale, MD;  [facility];  Service: Gastroenterology;  Laterality: N/A;   IR ANGIOGRAM SELECTIVE EACH ADDITIONAL VESSEL  11/22/2019   IR ANGIOGRAM SELECTIVE EACH ADDITIONAL VESSEL  11/24/2019   IR ANGIOGRAM SELECTIVE EACH ADDITIONAL VESSEL  11/24/2019   IR ANGIOGRAM SELECTIVE EACH ADDITIONAL VESSEL  11/24/2019   IR ANGIOGRAM SELECTIVE EACH ADDITIONAL VESSEL  11/15/2020   IR EMBO ART  VEN HEMORR LYMPH EXTRAV  INC GUIDE ROADMAPPING  11/22/2019   IR EMBO ART  VEN HEMORR LYMPH EXTRAV  INC GUIDE ROADMAPPING  11/24/2019   IR EMBO ART  VEN HEMORR LYMPH EXTRAV  INC GUIDE ROADMAPPING  11/15/2020   IR FLUORO GUIDE CV LINE RIGHT  11/15/2020   IR RADIOLOGIST EVAL & MGMT  12/29/2019   IR RADIOLOGIST EVAL & MGMT  10/27/2020   IR RADIOLOGIST EVAL & MGMT  04/19/2021   IR TIPS  11/24/2019   IR US GUIDE VASC ACCESS RIGHT  11/22/2019   IR US GUIDE VASC ACCESS RIGHT  11/15/2020   IR US GUIDE VASC ACCESS RIGHT  11/15/2020   IR VENOGRAM RENAL UNI LEFT  11/22/2019   IR VENOGRAM RENAL UNI LEFT  11/15/2020   POLYPECTOMY  06/22/2021   Procedure: POLYPECTOMY;  Surgeon: Daneil Dolin, MD;  [facility];  Service: Endoscopy;;   RADIOLOGY WITH ANESTHESIA N/A 11/24/2019   Procedure: IR WITH ANESTHESIA;  Surgeon: Radiologist, Medication, MD;  Location: [place];  Service: Radiology;  Laterality: N/A;   RADIOLOGY WITH ANESTHESIA N/A 11/15/2020   Procedure: IR WITH ANESTHESIA;  Surgeon: Suzette Battiest, MD;  Location: [place];  Service: Radiology;  Laterality: N/A;   TIPS PROCEDURE N/A 11/22/2019   Procedure: BRTO/TRANS-JUGULAR INTRAHEPATIC PORTAL SHUNT (TIPS);  Surgeon: Corrie Mckusick, DO;  Location: [place];  Service: Anesthesiology;  Laterality: N/A;   WISDOM TOOTH EXTRACTION       reports that he has been smoking cigarettes. He started smoking about 42 years ago. He has a 30.00 pack-year smoking  history. He has never used smokeless tobacco. He reports current alcohol use. He reports that he does not use drugs.  No Known Allergies  Family History  Problem Relation Age of Onset   Cancer Mother        ? etiology   Heart disease Mother    Colon polyps Sister 60       two sisters   Brain cancer Father    Liver disease Neg Hx     Prior to Admission medications   Medication Sig Start Date End Date Taking? Authorizing Provider  allopurinol (ZYLOPRIM) 100 MG tablet Take 1 tablet (100 mg total) by mouth daily. 09/11/22  Yes [name], MD  Cholecalciferol (VITAMIN D3) 1.25 MG (50000 UT) CAPS Take 1 each by mouth daily.   Yes [provider]  folic acid (FOLVITE) 1 MG tablet Take 1 mg by mouth daily. 06/27/22  Yes [provider]  lactulose (CHRONULAC) 10 GM/15ML solution Take 45 mLs (30 g total) by mouth every 6 (six) hours. Titrate as needed to have 2-4 bowel movements daily. 09/10/22  Yes Mahon, Courtney L, NP  ondansetron (ZOFRAN) 8 MG tablet Take 1 tablet (8 mg total) by mouth every 8 (eight)  hours as needed for up to 60 doses for nausea or vomiting. 10/04/22  Yes [name], MD  pantoprazole (PROTONIX) 40 MG tablet Take 40 mg by mouth 2 (two) times daily.   Yes [provider]  PARoxetine (PAXIL) 40 MG tablet Take 1 tablet (40 mg total) by mouth daily. 10/04/22  Yes [name], MD  sodium bicarbonate 650 MG tablet Take 2 tablets (1,300 mg total) by mouth 2 (two) times daily. 09/29/22  Yes Tat, Shanon Brow, MD  thiamine (VITAMIN B1) 100 MG tablet Take 100 mg by mouth daily. 11/23/20  Yes [provider]  vitamin B-12 (CYANOCOBALAMIN) 100 MCG tablet Take 100 mcg by mouth daily.   Yes [provider]  XIFAXAN 550 MG TABS tablet TAKE 1 TABLET(550 MG) BY MOUTH TWICE DAILY Patient taking differently: Take 550 mg by mouth 2 (two) times daily. 07/20/22  Yes Mahala Menghini, PA-C  zinc sulfate 220 (50 Zn) MG capsule Take 50 mg by mouth  daily.   Yes [provider]    Physical Exam: Vitals:   10/10/22 1331 10/10/22 1524  BP: 139/85 115/71  Pulse: (!) 118 100  Resp: 20 18  Temp: 97.6 F (36.4 C)   TempSrc: Oral   SpO2: 100% 100%    Constitutional: NAD, calm, comfortable Vitals:   10/10/22 1331 10/10/22 1524  BP: 139/85 115/71  Pulse: (!) 118 100  Resp: 20 18  Temp: 97.6 F (36.4 C)   TempSrc: Oral   SpO2: 100% 100%   Eyes: PERRL, lids and conjunctivae normal ENMT: Mucous membranes are moist.   Neck: normal, supple, no masses, no thyromegaly Respiratory: clear to auscultation bilaterally, no wheezing, no crackles. Normal respiratory effort. No accessory muscle use.  Cardiovascular: Regular rate and rhythm, no murmurs / rubs / gallops. No extremity edema.  Extremities warm. Abdomen: Full, mildly distended, soft, not firm, tense or tender, no masses palpated. Bowel sounds positive.  Musculoskeletal: no clubbing / cyanosis. No joint deformity upper and lower extremities.  Skin: no rashes, lesions, ulcers. No induration Neurologic: No apparent cranial nerve abnormality moving extremities spontaneously. Psychiatric: Normal judgment and insight. Alert and oriented x 3. Normal mood.   Labs on Admission: I have personally reviewed following labs and imaging studies  CBC: Recent Labs  Lab 10/10/22 1412  WBC 5.0  NEUTROABS 3.8  HGB 9.8*  HCT 28.1*  MCV 103.3*  PLT 540*   Basic Metabolic Panel: Recent Labs  Lab 10/04/22 1135 10/09/22 1115 10/10/22 1412  NA 133* 133* 131*  K 3.5 3.4* 3.2*  CL 103 101 106  CO2 12* WILL FOLLOW 13*  GLUCOSE 170* 162* 291*  BUN 38* 46* 52*  CREATININE 2.84* 4.47* 4.37*  CALCIUM 9.4 9.4 9.0   GFR: Estimated Creatinine Clearance: 24.4 mL/min (A) (by C-G formula based on SCr of 4.37 mg/dL (H)). Liver Function Tests: Recent Labs  Lab 10/04/22 1135 10/09/22 1115 10/10/22 1412  AST 38 39 37  ALT 23 20 23   ALKPHOS 191* 198* 152*  BILITOT 1.5* 1.5* 1.9*   PROT 6.5 6.7 6.4*  ALBUMIN 3.9 3.8 3.3*   Recent Labs  Lab 10/10/22 1412  LIPASE 62*   No results for input(s): "AMMONIA" in the last 168 hours. Coagulation Profile: Recent Labs  Lab 10/04/22 1135 10/09/22 1117  INR 1.1 1.1   Radiological Exams on Admission: No results found.  EKG: None.   Assessment/Plan Principal Problem:   Acute renal failure superimposed on stage 3b chronic kidney disease, unspecified  acute renal failure type ([place]) Active Problems:   Alcoholic cirrhosis of liver with ascites ([facility])   Portal hypertension ([facility])   Controlled type 2 diabetes mellitus without complication, without long-term current use of insulin ([facility])   Intractable nausea and vomiting  Assessment and Plan: * Acute renal failure superimposed on stage 3b chronic kidney disease, unspecified acute renal failure type ([facility]) Creatinine elevated at 4.37, baseline 2.3-2.8.  Etiology likely secondary to poor oral intake, episodes of vomiting, and frequent large volume paracentesis.  No change in bowel habits. Recent hospitalization for AKI also.  Recent renal ultrasound 10/19 without obstructive uropathy. Recent paracentesis - 10/27  - 6 L. 10/30  - 8.2 L. 10/13  - 4 L - Start gentle hydration normal saline 75cc/hr x 20hrs - Albumin 3.3   Metabolic acidosis Chronic none anion gap metabolic acidosis at baseline.  Serum bicarb 15 today.  Likely from combination of renal insufficiency and bowel movements from lactulose. -Resume home bicarb  Hypokalemia K- 3.2. - Replete - Check Mag  Intractable nausea and vomiting Chronic.  Likely related to portal hypertensive gastropathy.  Recent EGD 08/20/2019-Grade II esophageal varices.Type 2 gastroesophageal varices. Portal hypertensive gastropathy.  No abdominal complaints.  No change in bowel habits. -Resume Protonix, Zofran as needed   Controlled type 2 diabetes mellitus without complication, without long-term current use of insulin ([facility]) Controlled.   A1c 6.2.  Not on medication. - SSi- S  Alcoholic cirrhosis of liver with ascites ([facility]) Alcoholic liver cirrhosis complicated by hepatic encephalopathy, portal hypertension and esophageal varices.  Hgb stable at 9.8.  Mental status stable, he is compliant with lactulose 3 times daily with resultant average of 3 bowel movements daily.  Gets frequent paracentesis.  Last paracentesis 10/27 at 10/20 yielding 6 and 8 L respectively.  Abdomen is distended but not to the level where he needs paracentesis at this time. -Resume Xifaxan and lactulose   DVT prophylaxis: SCDS, with hx of varices and life threathening bleed Code Status: Full Family Communication: Spouse at bedside Disposition Plan: ~ 1 - 2 days Consults called: None Admission status: Obs tele   Author: Bethena Roys, MD 10/10/2022 6:09 PM  For on call review [email or web address].

## 2022-10-10 NOTE — Assessment & Plan Note (Addendum)
Creatinine elevated at 4.37, baseline 2.3-2.8.  Etiology likely secondary to poor oral intake, episodes of vomiting, and frequent large volume paracentesis.  No change in bowel habits. Recent hospitalization for AKI also.  Recent renal ultrasound 10/19 without obstructive uropathy. Recent paracentesis - 10/27  - 6 L. 10/30  - 8.2 L. 10/13  - 4 L - Start gentle hydration normal saline 75cc/hr x 20hrs - Albumin 3.3

## 2022-10-10 NOTE — Assessment & Plan Note (Signed)
He is followed by [facility] transplant and has follow-up scheduled for next month.  Repeat MELD-Na labs ordered today.  MELD 21 prior to discharge.  Continue lactulose and Xifaxan.

## 2022-10-10 NOTE — ED Provider Notes (Signed)
[facility] EMERGENCY DEPARTMENT Provider Note   CSN: [id number] Arrival date & time: 10/10/22  1318     History  Chief Complaint  Patient presents with   Abnormal Lab    Bradley Miles is a 56 y.o. male with a history of alcoholic cirrhosis of the liver with ascites, COPD, DMT2, peripheral neuropathy, HTN, hyperlipidemia, esophageal varices returning to the ED today due to abnormal labs.  Reportedly creatinine of 4.4 from yesterday's PCP labs and with decreased urinary output.  Denies dysuria, urinary frequency, or significantly discolored urine.  With baseline chronic N/V.  Abdomen feels "under pressure" though without significant pain.  Denies blood in urine, vomit, or bowel movements.  Pt's family member at baseline, reports pt appears at baseline.  Recently hospitalized for AKI 10/18 - 10/21.  Per records, has had intractable N/V since mid-October 2023, approximately 2-4 episodes per day.  Currently being evaluated by [facility] to the placed on liver transplant list.  September 2023 was treated at [facility] for hepatic encephalopathy and AKI.  Paracentesis on admission 10/20 with 8.2L removed, recent paracentesis on 10/27 with 6L removed "due to poor kidney function".     The history is provided by the patient and medical records.  Abnormal Lab     Home Medications Prior to Admission medications   Medication Sig Start Date End Date Taking? Authorizing Provider  allopurinol (ZYLOPRIM) 100 MG tablet Take 1 tablet (100 mg total) by mouth daily. 09/11/22  Yes [name], MD  Cholecalciferol (VITAMIN D3) 1.25 MG (50000 UT) CAPS Take 1 each by mouth daily.   Yes [provider]  folic acid (FOLVITE) 1 MG tablet Take 1 mg by mouth daily. 06/27/22  Yes [provider]  lactulose (CHRONULAC) 10 GM/15ML solution Take 45 mLs (30 g total) by mouth every 6 (six) hours. Titrate as needed to have 2-4 bowel movements daily. 09/10/22  Yes Mahon, Courtney L, NP  ondansetron (ZOFRAN) 8 MG  tablet Take 1 tablet (8 mg total) by mouth every 8 (eight) hours as needed for up to 60 doses for nausea or vomiting. 10/04/22  Yes [name], MD  pantoprazole (PROTONIX) 40 MG tablet Take 40 mg by mouth 2 (two) times daily.   Yes [provider]  PARoxetine (PAXIL) 40 MG tablet Take 1 tablet (40 mg total) by mouth daily. 10/04/22  Yes [name], MD  sodium bicarbonate 650 MG tablet Take 2 tablets (1,300 mg total) by mouth 2 (two) times daily. 09/29/22  Yes Tat, Shanon Brow, MD  thiamine (VITAMIN B1) 100 MG tablet Take 100 mg by mouth daily. 11/23/20  Yes [provider]  vitamin B-12 (CYANOCOBALAMIN) 100 MCG tablet Take 100 mcg by mouth daily.   Yes [provider]  XIFAXAN 550 MG TABS tablet TAKE 1 TABLET(550 MG) BY MOUTH TWICE DAILY Patient taking differently: Take 550 mg by mouth 2 (two) times daily. 07/20/22  Yes Mahala Menghini, PA-C  zinc sulfate 220 (50 Zn) MG capsule Take 50 mg by mouth daily.   Yes [provider]      Allergies    Patient has no known allergies.    Review of Systems   Review of Systems  Gastrointestinal:  Positive for nausea (chronic) and vomiting (chronic).    Physical Exam Updated Vital Signs BP 115/71   Pulse 100   Temp 97.6 F (36.4 C) (Oral)   Resp 18   SpO2 100%  Physical Exam Vitals and nursing note reviewed.  Constitutional:      General: He is not in acute distress.    Appearance: He is well-developed. He is not ill-appearing, toxic-appearing or diaphoretic.  HENT:     Head: Normocephalic and atraumatic.  Eyes:     General: Scleral icterus present.     Conjunctiva/sclera: Conjunctivae normal.  Cardiovascular:     Rate and Rhythm: Normal rate and regular rhythm.     Pulses: Normal pulses.     Heart sounds: No murmur heard. Pulmonary:     Effort: Pulmonary effort is normal. No respiratory distress.     Breath sounds: Normal breath sounds.     Comments: CTAB, communicates well without difficulty,  without increased respiratory effort Chest:     Comments: Non-TTP Abdominal:     General: Abdomen is protuberant. There is distension.     Palpations: Abdomen is soft. There is fluid wave.     Tenderness: There is no abdominal tenderness. There is no right CVA tenderness, left CVA tenderness or rebound.  Musculoskeletal:        General: No swelling.     Cervical back: Neck supple.  Skin:    General: Skin is warm and dry.     Capillary Refill: Capillary refill takes less than 2 seconds.     Coloration: Skin is jaundiced.  Neurological:     Mental Status: He is alert and oriented to person, place, and time. Mental status is at baseline.     GCS: GCS eye subscore is 4. GCS verbal subscore is 5. GCS motor subscore is 6.     Cranial Nerves: No dysarthria or facial asymmetry.     Coordination: Coordination normal.     Comments: At baseline mentation per family member  Psychiatric:        Mood and Affect: Mood normal.     ED Results / Procedures / Treatments   Labs (all labs ordered are listed, but only abnormal results are displayed) Labs Reviewed  CBC WITH DIFFERENTIAL/PLATELET - Abnormal; Notable for the following components:      Result Value   RBC 2.72 (*)    Hemoglobin 9.8 (*)    HCT 28.1 (*)    MCV 103.3 (*)    MCH 36.0 (*)    Platelets 105 (*)    Lymphs Abs 0.3 (*)    All other components within normal limits  COMPREHENSIVE METABOLIC PANEL - Abnormal; Notable for the following components:   Sodium 131 (*)    Potassium 3.2 (*)    CO2 13 (*)    Glucose, Bld 291 (*)    BUN 52 (*)    Creatinine, Ser 4.37 (*)    Total Protein 6.4 (*)    Albumin 3.3 (*)    Alkaline Phosphatase 152 (*)    Total Bilirubin 1.9 (*)    GFR, Estimated 15 (*)    All other components within normal limits  LIPASE, BLOOD - Abnormal; Notable for the following components:   Lipase 62 (*)    All other components within normal limits  URINALYSIS, ROUTINE W REFLEX MICROSCOPIC    EKG None  Radiology No results found.  Procedures Procedures    Medications Ordered in ED Medications - No data to display  ED Course/ Medical Decision Making/ A&P Clinical Course as of 10/10/22 1648  Wed Oct 10, 2022  1506 Creatinine(!): 4.37 [AC]  1644 Consulted with Dr. Denton Brick of hospitalist team.  Discussed patient's case in detail.  Labs suggestive of AKI.  Recently hospitalized with  same.  Agrees with plan for admission, no other recommendations at this time. [AC]    Clinical Course User Index [AC] Prince Rome, PA-C                           Medical Decision Making  56 y.o. male presents to the ED for concern of Abnormal Lab     This involves an extensive number of treatment options, and is a complaint that carries with it a high risk of complications and morbidity.  The emergent differential diagnosis prior to evaluation includes, but is not limited to: AKI, renal calculi, UTI  This is not an exhaustive differential.   Past Medical History / Co-morbidities / Social History: Hx of alcoholic cirrhosis of the liver with ascites, COPD, DMT2, peripheral neuropathy, HTN, hyperlipidemia, esophageal varices Social Determinants of Health include: None  Additional History:  Obtained by chart review.  Notably recent ED visits, see for details.  Lab Tests: I ordered, and personally interpreted labs.  The pertinent results include:   Creatinine 4.37, BUN 52, GFR 15 Total bili 1.9 Lipase 62 MCV 103.3, stable Sodium 131, steady decline over the last few weeks Potassium 3.2  Imaging Studies: None  Cardiac Monitoring: The patient was maintained on a cardiac monitor.  I personally viewed and interpreted the cardiac monitored which showed an underlying rhythm of: sinus tachycardia  ED Course / Critical Interventions: Pt overall well-appearing on exam.  Nonseptic, nontoxic in NAD.  Appears jaundiced.  AAOx4, confirmed at baseline per family member.  Known Hx of  alcoholic liver cirrhosis.  Has been hospitalized recently for AKI, recent labs drawn yesterday indicates creatinine of 4.4 per PCP.  Also with decreasing urinary output, increased difficulty trying to urinate.  Still with chronic N/V.  Without reported hematemesis, hematochezia, melena, hematuria.  Was recommended to come to the ED for likely admission. Upon reevaluation, patient status remains unchanged.  Creatinine 4.37, GFR 15.  Labs suggestive of AKI.  Elevated total bili 1.9 as well.  Mild anemia appears waxing and waning though generally stable.  Mildly increased hypokalemia and hyponatremia as well.  Again does not appear altered or delirious at this time.  Confirmed at baseline at family member, though occasionally appears to search longer for words. Consulted with Dr. Denton Brick of hospitalist team, concern for patient's presenting AKI, jaundiced and with elevated total bili.  Agrees with plan for admission for further treatment/assessment.  Disposition: Admission  This chart was dictated using voice recognition software.  Despite best efforts to proofread, errors can occur which can change the documentation meaning.         Final Clinical Impression(s) / ED Diagnoses Final diagnoses:  AKI (acute kidney injury) ([place])  Alcoholic cirrhosis of liver with ascites [facility])    Rx / DC Orders ED Discharge Orders     None         Prince Rome, PA-C 29/56/21 1649    Sherwood Gambler, MD 10/11/22 [phone]

## 2022-10-10 NOTE — Progress Notes (Signed)
Admitted to room and ambulated to bathroom .  Alert and oriented .  Jaundice skin color, abdominal distention and states has been having diarrhea due to taking lactulose.  On heart monitor and tachycardic and wife stated that his heart rate has been elevated lately.

## 2022-10-11 ENCOUNTER — Ambulatory Visit (HOSPITAL_COMMUNITY): Payer: Managed Care, Other (non HMO)

## 2022-10-11 ENCOUNTER — Encounter (HOSPITAL_COMMUNITY): Payer: Self-pay

## 2022-10-11 DIAGNOSIS — E876 Hypokalemia: Secondary | ICD-10-CM | POA: Diagnosis present

## 2022-10-11 DIAGNOSIS — Z808 Family history of malignant neoplasm of other organs or systems: Secondary | ICD-10-CM | POA: Diagnosis not present

## 2022-10-11 DIAGNOSIS — D631 Anemia in chronic kidney disease: Secondary | ICD-10-CM | POA: Diagnosis present

## 2022-10-11 DIAGNOSIS — N179 Acute kidney failure, unspecified: Secondary | ICD-10-CM | POA: Diagnosis present

## 2022-10-11 DIAGNOSIS — R110 Nausea: Secondary | ICD-10-CM | POA: Diagnosis not present

## 2022-10-11 DIAGNOSIS — E872 Acidosis, unspecified: Secondary | ICD-10-CM | POA: Diagnosis present

## 2022-10-11 DIAGNOSIS — J449 Chronic obstructive pulmonary disease, unspecified: Secondary | ICD-10-CM | POA: Diagnosis present

## 2022-10-11 DIAGNOSIS — Z79899 Other long term (current) drug therapy: Secondary | ICD-10-CM | POA: Diagnosis not present

## 2022-10-11 DIAGNOSIS — K7682 Hepatic encephalopathy: Secondary | ICD-10-CM | POA: Diagnosis present

## 2022-10-11 DIAGNOSIS — K219 Gastro-esophageal reflux disease without esophagitis: Secondary | ICD-10-CM | POA: Diagnosis present

## 2022-10-11 DIAGNOSIS — K766 Portal hypertension: Secondary | ICD-10-CM | POA: Diagnosis present

## 2022-10-11 DIAGNOSIS — E1142 Type 2 diabetes mellitus with diabetic polyneuropathy: Secondary | ICD-10-CM | POA: Diagnosis present

## 2022-10-11 DIAGNOSIS — F17210 Nicotine dependence, cigarettes, uncomplicated: Secondary | ICD-10-CM | POA: Diagnosis present

## 2022-10-11 DIAGNOSIS — E1122 Type 2 diabetes mellitus with diabetic chronic kidney disease: Secondary | ICD-10-CM | POA: Diagnosis present

## 2022-10-11 DIAGNOSIS — I8510 Secondary esophageal varices without bleeding: Secondary | ICD-10-CM | POA: Diagnosis present

## 2022-10-11 DIAGNOSIS — K3189 Other diseases of stomach and duodenum: Secondary | ICD-10-CM | POA: Diagnosis present

## 2022-10-11 DIAGNOSIS — N1832 Chronic kidney disease, stage 3b: Secondary | ICD-10-CM | POA: Diagnosis present

## 2022-10-11 DIAGNOSIS — Z8249 Family history of ischemic heart disease and other diseases of the circulatory system: Secondary | ICD-10-CM | POA: Diagnosis not present

## 2022-10-11 DIAGNOSIS — I129 Hypertensive chronic kidney disease with stage 1 through stage 4 chronic kidney disease, or unspecified chronic kidney disease: Secondary | ICD-10-CM | POA: Diagnosis present

## 2022-10-11 DIAGNOSIS — K7031 Alcoholic cirrhosis of liver with ascites: Secondary | ICD-10-CM | POA: Diagnosis present

## 2022-10-11 DIAGNOSIS — E785 Hyperlipidemia, unspecified: Secondary | ICD-10-CM | POA: Diagnosis present

## 2022-10-11 DIAGNOSIS — Z83719 Family history of colon polyps, unspecified: Secondary | ICD-10-CM | POA: Diagnosis not present

## 2022-10-11 LAB — CMP14+EGFR
ALT: 20 IU/L (ref 0–44)
AST: 39 IU/L (ref 0–40)
Albumin/Globulin Ratio: 1.3 (ref 1.2–2.2)
Albumin: 3.8 g/dL (ref 3.8–4.9)
Alkaline Phosphatase: 198 IU/L — ABNORMAL HIGH (ref 44–121)
BUN/Creatinine Ratio: 10 (ref 9–20)
BUN: 46 mg/dL — ABNORMAL HIGH (ref 6–24)
Bilirubin Total: 1.5 mg/dL — ABNORMAL HIGH (ref 0.0–1.2)
CO2: 15 mmol/L — ABNORMAL LOW (ref 20–29)
Calcium: 9.4 mg/dL (ref 8.7–10.2)
Chloride: 101 mmol/L (ref 96–106)
Creatinine, Ser: 4.47 mg/dL — ABNORMAL HIGH (ref 0.76–1.27)
Globulin, Total: 2.9 g/dL (ref 1.5–4.5)
Glucose: 162 mg/dL — ABNORMAL HIGH (ref 70–99)
Potassium: 3.4 mmol/L — ABNORMAL LOW (ref 3.5–5.2)
Sodium: 133 mmol/L — ABNORMAL LOW (ref 134–144)
Total Protein: 6.7 g/dL (ref 6.0–8.5)
eGFR: 15 mL/min/{1.73_m2} — ABNORMAL LOW (ref >59)

## 2022-10-11 LAB — BASIC METABOLIC PANEL
Anion gap: 9 (ref 5–15)
BUN: 52 mg/dL — ABNORMAL HIGH (ref 6–20)
CO2: 17 mmol/L — ABNORMAL LOW (ref 22–32)
Calcium: 8.8 mg/dL — ABNORMAL LOW (ref 8.9–10.3)
Chloride: 108 mmol/L (ref 98–111)
Creatinine, Ser: 4.52 mg/dL — ABNORMAL HIGH (ref 0.61–1.24)
GFR, Estimated: 15 mL/min — ABNORMAL LOW (ref >60)
Glucose, Bld: 143 mg/dL — ABNORMAL HIGH (ref 70–99)
Potassium: 4 mmol/L (ref 3.5–5.1)
Sodium: 134 mmol/L — ABNORMAL LOW (ref 135–145)

## 2022-10-11 LAB — CBC
HCT: 25.3 % — ABNORMAL LOW (ref 39.0–52.0)
Hemoglobin: 8.9 g/dL — ABNORMAL LOW (ref 13.0–17.0)
MCH: 36.2 pg — ABNORMAL HIGH (ref 26.0–34.0)
MCHC: 35.2 g/dL (ref 30.0–36.0)
MCV: 102.8 fL — ABNORMAL HIGH (ref 80.0–100.0)
Platelets: 91 10*3/uL — ABNORMAL LOW (ref 150–400)
RBC: 2.46 MIL/uL — ABNORMAL LOW (ref 4.22–5.81)
RDW: 14.3 % (ref 11.5–15.5)
WBC: 4.8 10*3/uL (ref 4.0–10.5)
nRBC: 0 % (ref 0.0–0.2)

## 2022-10-11 LAB — GLUCOSE, CAPILLARY
Glucose-Capillary: 138 mg/dL — ABNORMAL HIGH (ref 70–99)
Glucose-Capillary: 268 mg/dL — ABNORMAL HIGH (ref 70–99)
Glucose-Capillary: 128 mg/dL — ABNORMAL HIGH (ref 70–99)
Glucose-Capillary: 145 mg/dL — ABNORMAL HIGH (ref 70–99)

## 2022-10-11 MED ORDER — ACETAMINOPHEN 325 MG PO TABS
650.0000 mg | ORAL_TABLET | Freq: Four times a day (QID) | ORAL | Status: DC | PRN
  Filled 2022-10-11: qty 2

## 2022-10-11 MED ORDER — ALBUMIN HUMAN 25 % IV SOLN
50.0000 g | Freq: Once | INTRAVENOUS | Status: AC
  Administered 2022-10-11: 50 g via INTRAVENOUS
  Filled 2022-10-11: qty 200

## 2022-10-11 NOTE — TOC Progression Note (Signed)
  Transition of Care [facility]) Screening Note   Patient Details  Name: Bradley Miles Date of Birth: [birth year]   Transition of Care [facility]) CM/SW Contact:    Boneta Lucks, RN Phone Number: 10/11/2022, 3:05 PM    Transition of Care Department [facility]) has reviewed patient and no TOC needs have been identified at this time. We will continue to monitor patient advancement through interdisciplinary progression rounds. If new patient transition needs arise, please place a TOC consult.     Barriers to Discharge: Continued Medical Work up  Expected Discharge Plan and Services         Living arrangements for the past 2 months: [place]

## 2022-10-11 NOTE — Progress Notes (Signed)
PROGRESS NOTE    Bradley Miles  UDJ:[id number] DOB: 07-Jul-1966 DOA: 10/10/2022 PCP: [name], MD   Brief Narrative:    Bradley Miles is a 56 y.o. male with medical history significant for alcohol liver cirrhosis , hepatic encephalopathy and esophageal varices, COPD, hypertension. Patient presented to the ED with reports of abnormal labs.  He had blood work done by his primary care provider which showed elevated creatinine he was referred to the ED. he has had recent large-volume paracentesis with 6 L removed on 10/27.  He has had decreased oral intake with nausea and vomiting.  Assessment & Plan:   Principal Problem:   Acute renal failure superimposed on stage 3b chronic kidney disease, unspecified acute renal failure type (HCC) Active Problems:   Alcoholic cirrhosis of liver with ascites (HCC)   Portal hypertension (HCC)   Controlled type 2 diabetes mellitus without complication, without long-term current use of insulin (HCC)   Intractable nausea and vomiting   Hypokalemia   Metabolic acidosis  Assessment and Plan:   Acute renal failure superimposed on stage 3b chronic kidney disease, unspecified acute renal failure type (HCC) Creatinine elevated at 4.37, baseline 2.3-2.8.  Etiology likely secondary to poor oral intake, episodes of vomiting, and frequent large volume paracentesis.  No change in bowel habits. Recent hospitalization for AKI also.  Recent renal ultrasound 10/19 without obstructive uropathy. Recent paracentesis - 10/27  - 6 L. 10/30  - 8.2 L. 10/13  - 4 L - Start gentle hydration normal saline 75cc/hr x 20hrs - Albumin 3.3, discussed case with GI with plans to give 2 doses of albumin today and tomorrow.     Metabolic acidosis-improving Chronic none anion gap metabolic acidosis at baseline.  Serum bicarb 15 today.  Likely from combination of renal insufficiency and bowel movements from lactulose. -Resume home bicarb   Intractable nausea and  vomiting Chronic.  Likely related to portal hypertensive gastropathy.  Recent EGD 08/20/2019-Grade II esophageal varices.Type 2 gastroesophageal varices. Portal hypertensive gastropathy.  No abdominal complaints.  No change in bowel habits. -Resume Protonix, Zofran as needed     Controlled type 2 diabetes mellitus without complication, without long-term current use of insulin (HCC) Controlled.  A1c 6.2.  Not on medication. - SSi- S   Alcoholic cirrhosis of liver with ascites (HCC) Alcoholic liver cirrhosis complicated by hepatic encephalopathy, portal hypertension and esophageal varices.  Hgb stable at 9.8.  Mental status stable, he is compliant with lactulose 3 times daily with resultant average of 3 bowel movements daily.  Gets frequent paracentesis.  Last paracentesis 10/27 at 10/20 yielding 6 and 8 L respectively.  Abdomen is distended but not to the level where he needs paracentesis at this time. -Resume Xifaxan and lactulose   DVT prophylaxis: SCDs Code Status: Full Family Communication: None at bedside Disposition Plan:  Status is: Observation The patient will require care spanning > 2 midnights and should be moved to inpatient because: IV fluid.   Nutritional Assessment:  The patient's BMI is: Body mass index is 31.16 kg/m.Marland Kitchen  Seen by dietician.  I agree with the assessment and plan as outlined below:  Nutrition Status:      Consultants:  None  Procedures:  None  Antimicrobials:  Anti-infectives (From admission, onward)    Start     Dose/Rate Route Frequency Ordered Stop   10/10/22 2200  rifaximin (XIFAXAN) tablet 550 mg        550 mg Oral 2 times daily 10/10/22 1901  Subjective: Patient seen and evaluated today with no new acute complaints or concerns. No acute concerns or events noted overnight.  He states he has less nausea and vomiting and is tolerating more of a diet this morning.  He denies any abdominal pain or tenderness.  Objective: Vitals:    10/10/22 1829 10/10/22 1843 10/10/22 1958 10/11/22 0503  BP: 119/77 119/77 123/73 104/70  Pulse: (!) 113 (!) 113 (!) 107 (!) 104  Resp: 16 16 16 17   Temp: 98.3 F (36.8 C) 98.3 F (36.8 C) 98.7 F (37.1 C) (!) 97.5 F (36.4 C)  TempSrc:  Oral Oral   SpO2: 100%  100% 100%  Weight:  104.2 kg    Height:  6' (1.829 m)      Intake/Output Summary (Last 24 hours) at 10/11/2022 0923 Last data filed at 10/11/2022 0500 Gross per 24 hour  Intake 597.5 ml  Output 202 ml  Net 395.5 ml   Filed Weights   10/10/22 1843  Weight: 104.2 kg    Examination:  General exam: Appears calm and comfortable  Respiratory system: Clear to auscultation. Respiratory effort normal. Cardiovascular system: S1 & S2 heard, RRR.  Gastrointestinal system: Abdomen is soft, mildly distended Central nervous system: Alert and awake Extremities: No edema Skin: No significant lesions noted Psychiatry: Flat affect.    Data Reviewed: I have personally reviewed following labs and imaging studies  CBC: Recent Labs  Lab 10/10/22 1412 10/11/22 0337  WBC 5.0 4.8  NEUTROABS 3.8  --   HGB 9.8* 8.9*  HCT 28.1* 25.3*  MCV 103.3* 102.8*  PLT 105* 91*   Basic Metabolic Panel: Recent Labs  Lab 10/04/22 1135 10/09/22 1115 10/10/22 1412 10/11/22 0337  NA 133* 133* 131* 134*  K 3.5 3.4* 3.2* 4.0  CL 103 101 106 108  CO2 12* 15* 13* 17*  GLUCOSE 170* 162* 291* 143*  BUN 38* 46* 52* 52*  CREATININE 2.84* 4.47* 4.37* 4.52*  CALCIUM 9.4 9.4 9.0 8.8*  MG  --   --  2.0  --    GFR: Estimated Creatinine Clearance: 23.6 mL/min (A) (by C-G formula based on SCr of 4.52 mg/dL (H)). Liver Function Tests: Recent Labs  Lab 10/04/22 1135 10/09/22 1115 10/10/22 1412  AST 38 39 37  ALT 23 20 23   ALKPHOS 191* 198* 152*  BILITOT 1.5* 1.5* 1.9*  PROT 6.5 6.7 6.4*  ALBUMIN 3.9 3.8 3.3*   Recent Labs  Lab 10/10/22 1412  LIPASE 62*   No results for input(s): "AMMONIA" in the last 168 hours. Coagulation  Profile: Recent Labs  Lab 10/04/22 1135 10/09/22 1117  INR 1.1 1.1   Cardiac Enzymes: No results for input(s): "CKTOTAL", "CKMB", "CKMBINDEX", "TROPONINI" in the last 168 hours. BNP (last 3 results) No results for input(s): "PROBNP" in the last 8760 hours. HbA1C: No results for input(s): "HGBA1C" in the last 72 hours. CBG: Recent Labs  Lab 10/10/22 2203 10/11/22 0711  GLUCAP 158* 138*   Lipid Profile: No results for input(s): "CHOL", "HDL", "LDLCALC", "TRIG", "CHOLHDL", "LDLDIRECT" in the last 72 hours. Thyroid Function Tests: No results for input(s): "TSH", "T4TOTAL", "FREET4", "T3FREE", "THYROIDAB" in the last 72 hours. Anemia Panel: No results for input(s): "VITAMINB12", "FOLATE", "FERRITIN", "TIBC", "IRON", "RETICCTPCT" in the last 72 hours. Sepsis Labs: No results for input(s): "PROCALCITON", "LATICACIDVEN" in the last 168 hours.  Recent Results (from the past 240 hour(s))  Gram stain     Status: None   Collection Time: 10/05/22 10:25 AM   Specimen:  Ascitic  Result Value Ref Range Status   Specimen Description ASCITIC  Final   Special Requests NONE  Final   Gram Stain   Final    WBC PRESENT,BOTH PMN AND MONONUCLEAR NO ORGANISMS SEEN CYTOSPIN SMEAR Performed at [facility], [address], [place], [place] [address]    Report Status 10/05/2022 FINAL  Final  Culture, body fluid w Gram Stain-bottle     Status: None   Collection Time: 10/05/22 10:25 AM   Specimen: Ascitic  Result Value Ref Range Status   Specimen Description ASCITIC  Final   Special Requests 10CC  Final   Culture   Final    NO GROWTH 5 DAYS Performed at [facility], [address], [place], [place] [address]    Report Status 10/10/2022 FINAL  Final         Radiology Studies: No results found.      Scheduled Meds:  allopurinol  100 mg Oral Daily   insulin aspart  0-5 Units Subcutaneous QHS   insulin aspart  0-9 Units Subcutaneous TID WC   lactulose  30 g Oral TID   pantoprazole   40 mg Oral BID   PARoxetine  40 mg Oral Daily   rifaximin  550 mg Oral BID   sodium bicarbonate  1,300 mg Oral BID   Continuous Infusions:  sodium chloride 75 mL/hr at 10/11/22 0500   albumin human       LOS: 0 days    Time spent: 35 minutes    Tereso Unangst Darleen Crocker, DO [facility]  If 7PM-7AM, please contact night-coverage [email or web address] 10/11/2022, 9:23 AM

## 2022-10-12 ENCOUNTER — Inpatient Hospital Stay (HOSPITAL_COMMUNITY): Admission: RE | Admit: 2022-10-12 | Payer: Managed Care, Other (non HMO) | Source: Ambulatory Visit

## 2022-10-12 ENCOUNTER — Inpatient Hospital Stay (HOSPITAL_COMMUNITY): Payer: Managed Care, Other (non HMO)

## 2022-10-12 ENCOUNTER — Encounter (HOSPITAL_COMMUNITY): Payer: Self-pay | Admitting: Internal Medicine

## 2022-10-12 DIAGNOSIS — N179 Acute kidney failure, unspecified: Secondary | ICD-10-CM | POA: Diagnosis not present

## 2022-10-12 DIAGNOSIS — N1832 Chronic kidney disease, stage 3b: Secondary | ICD-10-CM | POA: Diagnosis not present

## 2022-10-12 LAB — GLUCOSE, CAPILLARY
Glucose-Capillary: 126 mg/dL — ABNORMAL HIGH (ref 70–99)
Glucose-Capillary: 240 mg/dL — ABNORMAL HIGH (ref 70–99)
Glucose-Capillary: 172 mg/dL — ABNORMAL HIGH (ref 70–99)
Glucose-Capillary: 192 mg/dL — ABNORMAL HIGH (ref 70–99)

## 2022-10-12 LAB — CBC
HCT: 23 % — ABNORMAL LOW (ref 39.0–52.0)
Hemoglobin: 8.1 g/dL — ABNORMAL LOW (ref 13.0–17.0)
MCH: 36.7 pg — ABNORMAL HIGH (ref 26.0–34.0)
MCHC: 35.2 g/dL (ref 30.0–36.0)
MCV: 104.1 fL — ABNORMAL HIGH (ref 80.0–100.0)
Platelets: 82 10*3/uL — ABNORMAL LOW (ref 150–400)
RBC: 2.21 MIL/uL — ABNORMAL LOW (ref 4.22–5.81)
RDW: 14.3 % (ref 11.5–15.5)
WBC: 3.5 10*3/uL — ABNORMAL LOW (ref 4.0–10.5)
nRBC: 0 % (ref 0.0–0.2)

## 2022-10-12 LAB — SODIUM, URINE, RANDOM: Sodium, Ur: <10 mmol/L

## 2022-10-12 LAB — BASIC METABOLIC PANEL
Anion gap: 9 (ref 5–15)
BUN: 53 mg/dL — ABNORMAL HIGH (ref 6–20)
CO2: 16 mmol/L — ABNORMAL LOW (ref 22–32)
Calcium: 9 mg/dL (ref 8.9–10.3)
Chloride: 109 mmol/L (ref 98–111)
Creatinine, Ser: 4.18 mg/dL — ABNORMAL HIGH (ref 0.61–1.24)
GFR, Estimated: 16 mL/min — ABNORMAL LOW (ref >60)
Glucose, Bld: 129 mg/dL — ABNORMAL HIGH (ref 70–99)
Potassium: 3.7 mmol/L (ref 3.5–5.1)
Sodium: 134 mmol/L — ABNORMAL LOW (ref 135–145)

## 2022-10-12 LAB — MAGNESIUM: Magnesium: 2 mg/dL (ref 1.7–2.4)

## 2022-10-12 LAB — CREATININE, URINE, RANDOM: Creatinine, Urine: 206.42 mg/dL

## 2022-10-12 MED ORDER — ALBUMIN HUMAN 25 % IV SOLN
50.0000 g | Freq: Once | INTRAVENOUS | Status: AC
  Administered 2022-10-12: 50 g via INTRAVENOUS
  Filled 2022-10-12: qty 200

## 2022-10-12 MED ORDER — VITAMIN D 25 MCG (1000 UNIT) PO TABS
1000.0000 [IU] | ORAL_TABLET | Freq: Every day | ORAL | Status: DC
  Administered 2022-10-12 – 2022-10-15 (×4): 1000 [IU] via ORAL
  Filled 2022-10-12 (×4): qty 1

## 2022-10-12 MED ORDER — VITAMIN B-12 100 MCG PO TABS
100.0000 ug | ORAL_TABLET | Freq: Every day | ORAL | Status: DC
  Administered 2022-10-12 – 2022-10-15 (×4): 100 ug via ORAL
  Filled 2022-10-12 (×4): qty 1

## 2022-10-12 MED ORDER — FOLIC ACID 1 MG PO TABS
1.0000 mg | ORAL_TABLET | Freq: Every day | ORAL | Status: DC
  Administered 2022-10-12 – 2022-10-15 (×4): 1 mg via ORAL
  Filled 2022-10-12 (×4): qty 1

## 2022-10-12 MED ORDER — ZINC SULFATE 220 (50 ZN) MG PO CAPS
220.0000 mg | ORAL_CAPSULE | Freq: Every day | ORAL | Status: DC
  Administered 2022-10-12 – 2022-10-15 (×4): 220 mg via ORAL
  Filled 2022-10-12 (×4): qty 1

## 2022-10-12 MED ORDER — SODIUM CHLORIDE 0.9 % IV SOLN: INTRAVENOUS | Status: DC

## 2022-10-12 MED ORDER — THIAMINE MONONITRATE 100 MG PO TABS
100.0000 mg | ORAL_TABLET | Freq: Every day | ORAL | Status: DC
  Administered 2022-10-12 – 2022-10-15 (×4): 100 mg via ORAL
  Filled 2022-10-12 (×4): qty 1

## 2022-10-12 MED ORDER — THIAMINE HCL 100 MG PO TABS
100.0000 mg | ORAL_TABLET | Freq: Every day | ORAL | Status: DC
  Filled 2022-10-12 (×4): qty 1

## 2022-10-12 NOTE — Procedures (Signed)
INDICATION: Cirrhosis and recurrent ascites EXAM: ULTRASOUND GUIDED LEFT PARACENTESIS GRIP-IR: Category: Fluids  Subcategory: Paracentesis  Follow-Up: None  MEDICATIONS: None. COMPLICATIONS: None immediate. PROCEDURE: Informed written consent was obtained from the patient after a discussion of the risks (including bleeding, infection, and bowel injury), benefits and alternatives to treatment. A timeout was performed prior to the initiation of the procedure.  Initial ultrasound scanning demonstrates a large amount of ascites within the left lower abdominal quadrant. The right lower abdomen was prepped and draped in the usual sterile fashion. 1% lidocaine  was used for local anesthesia.   Following this, a 19 gauge, 7-cm, Yueh catheter was introduced. An ultrasound image was saved for documentation purposes. The paracentesis was performed. The catheter was removed and a dressing was applied. The patient tolerated the procedure well without immediate post procedural complication.  Patient received post-procedure intravenous albumin; see nursing notes for details. FINDINGS: A total of approximately 4 L of straw-colored fluid was removed.  As requested, the paracentesis was stopped at 4 L.  IMPRESSION:  Successful ultrasound-guided paracentesis yielding 4 liters of peritoneal fluid.

## 2022-10-12 NOTE — Progress Notes (Signed)
Patient tolerated left sided paracentesis procedure well today and 4 Liters of ascites removed. PT verbalized understanding of post procedure instructions and transported back to inpatient bed assignment at this time with no acute distress noted.

## 2022-10-12 NOTE — Progress Notes (Addendum)
Pt slept through night. Pt has complained of nausea but refused Zofran when offered. Ascites and tender abdomen noted. Pt reported 0/10 pain. Pt experiencing slight tremors.

## 2022-10-12 NOTE — Progress Notes (Signed)
PROGRESS NOTE    Bradley Miles  JQB:[id number] DOB: 1966/05/17 DOA: 10/10/2022 PCP: [name], MD   Brief Narrative:    VANDER KUEKER is a 56 y.o. male with medical history significant for alcohol liver cirrhosis , hepatic encephalopathy and esophageal varices, COPD, hypertension. Patient presented to the ED with reports of abnormal labs.  He had blood work done by his primary care provider which showed elevated creatinine he was referred to the ED. he has had recent large-volume paracentesis with 6 L removed on 10/27.  He has had decreased oral intake with nausea and vomiting.  Assessment & Plan:   Principal Problem:   Acute renal failure superimposed on stage 3b chronic kidney disease, unspecified acute renal failure type (HCC) Active Problems:   Alcoholic cirrhosis of liver with ascites (HCC)   Portal hypertension (HCC)   Controlled type 2 diabetes mellitus without complication, without long-term current use of insulin (HCC)   Intractable nausea and vomiting   Hypokalemia   Metabolic acidosis   AKI (acute kidney injury) ([place])  Assessment and Plan:   Acute renal failure superimposed on stage 3b chronic kidney disease, unspecified acute renal failure type (HCC)-improving Creatinine elevated at 4.37, baseline 2.3-2.8.  Etiology likely secondary to poor oral intake, episodes of vomiting, and frequent large volume paracentesis.  No change in bowel habits. Recent hospitalization for AKI also.  Recent renal ultrasound 10/19 without obstructive uropathy. Recent paracentesis - 10/27  - 6 L. 10/30  - 8.2 L. 10/13  - 4 L - Start gentle hydration normal saline 75cc/hr; discussed case with nephrology - Albumin 3.3, discussed case with GI with plans to give 2 doses of albumin today and tomorrow.     Metabolic acidosis-improving Chronic none anion gap metabolic acidosis at baseline.  Serum bicarb 15 today.  Likely from combination of renal insufficiency and bowel movements from  lactulose. -Resume home bicarb   Intractable nausea and vomiting-improving Chronic.  Likely related to portal hypertensive gastropathy.  Recent EGD 08/20/2019-Grade II esophageal varices.Type 2 gastroesophageal varices. Portal hypertensive gastropathy.  No abdominal complaints.  No change in bowel habits. -Resume Protonix, Zofran as needed     Controlled type 2 diabetes mellitus without complication, without long-term current use of insulin (HCC) Controlled.  A1c 6.2.  Not on medication. - SSi- S   Alcoholic cirrhosis of liver with ascites (HCC) Alcoholic liver cirrhosis complicated by hepatic encephalopathy, portal hypertension and esophageal varices.  Hgb stable at 9.8.  Mental status stable, he is compliant with lactulose 3 times daily with resultant average of 3 bowel movements daily.  Gets frequent paracentesis.  Last paracentesis 10/27 at 10/20 yielding 6 and 8 L respectively.  Abdomen is distended but not to the level where he needs paracentesis at this time. -Resume Xifaxan and lactulose -Check ammonia in a.m.   DVT prophylaxis: SCDs Code Status: Full Family Communication: None at bedside Disposition Plan:  Status is: Inpatient Remains inpatient appropriate because: Due to need for ongoing IV fluid.      Nutritional Assessment:   The patient's BMI is: Body mass index is 31.16 kg/m.Marland Kitchen   Seen by dietician.  I agree with the assessment and plan as outlined below:    Consultants:  None   Procedures:  None   Antimicrobials:  Anti-infectives (From admission, onward)    Start     Dose/Rate Route Frequency Ordered Stop   10/10/22 2200  rifaximin (XIFAXAN) tablet 550 mg        550 mg  Oral 2 times daily 10/10/22 1901         Subjective: Patient seen and evaluated today with no new acute complaints or concerns. No acute concerns or events noted overnight.  He has had some nausea overnight but appears to be tolerating more oral intake.  Objective: Vitals:   10/11/22  0924 10/11/22 1321 10/11/22 2029 10/12/22 0437  BP: 118/79 115/69 109/62 104/65  Pulse: 100 (!) 109 (!) 102 99  Resp:  17 18 20   Temp:   98.5 F (36.9 C) 98.2 F (36.8 C)  TempSrc:    Oral  SpO2: 100% 100% 100% 99%  Weight:      Height:        Intake/Output Summary (Last 24 hours) at 10/12/2022 1024 Last data filed at 10/12/2022 0800 Gross per 24 hour  Intake 720 ml  Output --  Net 720 ml   Filed Weights   10/10/22 1843  Weight: 104.2 kg    Examination:  General exam: Appears calm and comfortable  Respiratory system: Clear to auscultation. Respiratory effort normal. Cardiovascular system: S1 & S2 heard, RRR.  Gastrointestinal system: Abdomen is mildly distended and tender to palpation Central nervous system: Alert and awake Extremities: No edema Skin: No significant lesions noted Psychiatry: Flat affect.    Data Reviewed: I have personally reviewed following labs and imaging studies  CBC: Recent Labs  Lab 10/10/22 1412 10/11/22 0337 10/12/22 0329  WBC 5.0 4.8 3.5*  NEUTROABS 3.8  --   --   HGB 9.8* 8.9* 8.1*  HCT 28.1* 25.3* 23.0*  MCV 103.3* 102.8* 104.1*  PLT 105* 91* 82*   Basic Metabolic Panel: Recent Labs  Lab 10/09/22 1115 10/10/22 1412 10/11/22 0337 10/12/22 0329  NA 133* 131* 134* 134*  K 3.4* 3.2* 4.0 3.7  CL 101 106 108 109  CO2 15* 13* 17* 16*  GLUCOSE 162* 291* 143* 129*  BUN 46* 52* 52* 53*  CREATININE 4.47* 4.37* 4.52* 4.18*  CALCIUM 9.4 9.0 8.8* 9.0  MG  --  2.0  --  2.0   GFR: Estimated Creatinine Clearance: 25.5 mL/min (A) (by C-G formula based on SCr of 4.18 mg/dL (H)). Liver Function Tests: Recent Labs  Lab 10/09/22 1115 10/10/22 1412  AST 39 37  ALT 20 23  ALKPHOS 198* 152*  BILITOT 1.5* 1.9*  PROT 6.7 6.4*  ALBUMIN 3.8 3.3*   Recent Labs  Lab 10/10/22 1412  LIPASE 62*   No results for input(s): "AMMONIA" in the last 168 hours. Coagulation Profile: Recent Labs  Lab 10/09/22 1117  INR 1.1   Cardiac  Enzymes: No results for input(s): "CKTOTAL", "CKMB", "CKMBINDEX", "TROPONINI" in the last 168 hours. BNP (last 3 results) No results for input(s): "PROBNP" in the last 8760 hours. HbA1C: No results for input(s): "HGBA1C" in the last 72 hours. CBG: Recent Labs  Lab 10/11/22 0711 10/11/22 1107 10/11/22 1609 10/11/22 2114 10/12/22 0806  GLUCAP 138* 268* 128* 145* 126*   Lipid Profile: No results for input(s): "CHOL", "HDL", "LDLCALC", "TRIG", "CHOLHDL", "LDLDIRECT" in the last 72 hours. Thyroid Function Tests: No results for input(s): "TSH", "T4TOTAL", "FREET4", "T3FREE", "THYROIDAB" in the last 72 hours. Anemia Panel: No results for input(s): "VITAMINB12", "FOLATE", "FERRITIN", "TIBC", "IRON", "RETICCTPCT" in the last 72 hours. Sepsis Labs: No results for input(s): "PROCALCITON", "LATICACIDVEN" in the last 168 hours.  Recent Results (from the past 240 hour(s))  Gram stain     Status: None   Collection Time: 10/05/22 10:25 AM   Specimen: Ascitic  Result Value Ref Range Status   Specimen Description ASCITIC  Final   Special Requests NONE  Final   Gram Stain   Final    WBC PRESENT,BOTH PMN AND MONONUCLEAR NO ORGANISMS SEEN CYTOSPIN SMEAR Performed at [facility], [address] [place], [place] [address]    Report Status 10/05/2022 FINAL  Final  Culture, body fluid w Gram Stain-bottle     Status: None   Collection Time: 10/05/22 10:25 AM   Specimen: Ascitic  Result Value Ref Range Status   Specimen Description ASCITIC  Final   Special Requests 10CC  Final   Culture   Final    NO GROWTH 5 DAYS Performed at [facility], [address], [place], [place] [address]    Report Status 10/10/2022 FINAL  Final         Radiology Studies: No results found.      Scheduled Meds:  allopurinol  100 mg Oral Daily   insulin aspart  0-5 Units Subcutaneous QHS   insulin aspart  0-9 Units Subcutaneous TID WC   lactulose  30 g Oral TID   pantoprazole  40 mg Oral BID    PARoxetine  40 mg Oral Daily   rifaximin  550 mg Oral BID   sodium bicarbonate  1,300 mg Oral BID   Continuous Infusions:  sodium chloride       LOS: 1 day    Time spent: 35 minutes    Jeraldine Primeau Darleen Crocker, DO [facility]  If 7PM-7AM, please contact night-coverage [email or web address] 10/12/2022, 10:24 AM

## 2022-10-13 DIAGNOSIS — N179 Acute kidney failure, unspecified: Secondary | ICD-10-CM | POA: Diagnosis not present

## 2022-10-13 DIAGNOSIS — N1832 Chronic kidney disease, stage 3b: Secondary | ICD-10-CM | POA: Diagnosis not present

## 2022-10-13 LAB — CBC
HCT: 21.2 % — ABNORMAL LOW (ref 39.0–52.0)
Hemoglobin: 7.3 g/dL — ABNORMAL LOW (ref 13.0–17.0)
MCH: 36.1 pg — ABNORMAL HIGH (ref 26.0–34.0)
MCHC: 34.4 g/dL (ref 30.0–36.0)
MCV: 105 fL — ABNORMAL HIGH (ref 80.0–100.0)
Platelets: 75 10*3/uL — ABNORMAL LOW (ref 150–400)
RBC: 2.02 MIL/uL — ABNORMAL LOW (ref 4.22–5.81)
RDW: 14.5 % (ref 11.5–15.5)
WBC: 2.3 10*3/uL — ABNORMAL LOW (ref 4.0–10.5)
nRBC: 0 % (ref 0.0–0.2)

## 2022-10-13 LAB — COMPREHENSIVE METABOLIC PANEL
ALT: 17 U/L (ref 0–44)
AST: 31 U/L (ref 15–41)
Albumin: 3.6 g/dL (ref 3.5–5.0)
Alkaline Phosphatase: 110 U/L (ref 38–126)
Anion gap: 7 (ref 5–15)
BUN: 49 mg/dL — ABNORMAL HIGH (ref 6–20)
CO2: 17 mmol/L — ABNORMAL LOW (ref 22–32)
Calcium: 9.1 mg/dL (ref 8.9–10.3)
Chloride: 112 mmol/L — ABNORMAL HIGH (ref 98–111)
Creatinine, Ser: 3.61 mg/dL — ABNORMAL HIGH (ref 0.61–1.24)
GFR, Estimated: 19 mL/min — ABNORMAL LOW (ref >60)
Glucose, Bld: 141 mg/dL — ABNORMAL HIGH (ref 70–99)
Potassium: 3 mmol/L — ABNORMAL LOW (ref 3.5–5.1)
Sodium: 136 mmol/L (ref 135–145)
Total Bilirubin: 1.8 mg/dL — ABNORMAL HIGH (ref 0.3–1.2)
Total Protein: 5.8 g/dL — ABNORMAL LOW (ref 6.5–8.1)

## 2022-10-13 LAB — GLUCOSE, CAPILLARY
Glucose-Capillary: 122 mg/dL — ABNORMAL HIGH (ref 70–99)
Glucose-Capillary: 154 mg/dL — ABNORMAL HIGH (ref 70–99)
Glucose-Capillary: 145 mg/dL — ABNORMAL HIGH (ref 70–99)

## 2022-10-13 LAB — MAGNESIUM: Magnesium: 1.9 mg/dL (ref 1.7–2.4)

## 2022-10-13 LAB — HEMOGLOBIN AND HEMATOCRIT, BLOOD
HCT: 21.9 % — ABNORMAL LOW (ref 39.0–52.0)
Hemoglobin: 7.4 g/dL — ABNORMAL LOW (ref 13.0–17.0)

## 2022-10-13 LAB — AMMONIA: Ammonia: 85 umol/L — ABNORMAL HIGH (ref 9–35)

## 2022-10-13 MED ORDER — POTASSIUM CHLORIDE CRYS ER 20 MEQ PO TBCR
40.0000 meq | EXTENDED_RELEASE_TABLET | Freq: Two times a day (BID) | ORAL | Status: AC
  Administered 2022-10-13 (×2): 40 meq via ORAL
  Filled 2022-10-13 (×2): qty 2

## 2022-10-13 MED ORDER — PROCHLORPERAZINE EDISYLATE 10 MG/2ML IJ SOLN
10.0000 mg | Freq: Four times a day (QID) | INTRAMUSCULAR | Status: DC | PRN
  Administered 2022-10-14: 10 mg via INTRAVENOUS
  Filled 2022-10-13 (×3): qty 2

## 2022-10-13 NOTE — Progress Notes (Signed)
Pt c/o nausea and vomiting multiple time today  Zofran given at 1435. Pt continues to c/o nausea and vomiting even after Zofran was given.

## 2022-10-13 NOTE — Progress Notes (Addendum)
PROGRESS NOTE    Bradley Miles  UUV:[id number] DOB: March 25, 1966 DOA: 10/10/2022 PCP: [name], MD   Brief Narrative:    Bradley Miles is a 56 y.o. male with medical history significant for alcohol liver cirrhosis , hepatic encephalopathy and esophageal varices, COPD, hypertension. Patient presented to the ED with reports of abnormal labs.  Bradley Miles had blood work done by his primary care provider which showed elevated creatinine Bradley Miles was referred to the ED. Bradley Miles has had recent large-volume paracentesis with 6 L removed on 10/27.  Bradley Miles has had decreased oral intake with nausea and vomiting.  Assessment & Plan:   Principal Problem:   Acute renal failure superimposed on stage 3b chronic kidney disease, unspecified acute renal failure type (HCC) Active Problems:   Alcoholic cirrhosis of liver with ascites (HCC)   Portal hypertension (HCC)   Controlled type 2 diabetes mellitus without complication, without long-term current use of insulin (HCC)   Intractable nausea and vomiting   Hypokalemia   Metabolic acidosis   AKI (acute kidney injury) ([place])  Assessment and Plan:  Acute renal failure superimposed on stage 3b chronic kidney disease, unspecified acute renal failure type (HCC)-improving Creatinine elevated at 4.37, baseline 2.3-2.8.  Etiology likely secondary to poor oral intake, episodes of vomiting, and frequent large volume paracentesis.  No change in bowel habits. Recent hospitalization for AKI also.  Recent renal ultrasound 10/19 without obstructive uropathy. Recent paracentesis - 10/27  - 6 L. 10/30  - 8.2 L. 10/13  - 4 L -Continue gentle hydration normal saline 75cc/hr; discussed case with nephrology - Albumin 3.6, hold further albumin dosing for now as PRBC transfusion may be required  Worsening anemia of chronic disease -Patient has required repeat PRBC transfusions in the past, no overt bleeding noted -Monitor closely and transfuse for hemoglobin less than 7   Metabolic  acidosis-improving Chronic none anion gap metabolic acidosis at baseline.  Serum bicarb 15 today.  Likely from combination of renal insufficiency and bowel movements from lactulose. -Resume home bicarb  Hypokalemia -Replete and reevaluate in a.m.   Intractable nausea and vomiting-improving Chronic.  Likely related to portal hypertensive gastropathy.  Recent EGD 08/20/2019-Grade II esophageal varices.Type 2 gastroesophageal varices. Portal hypertensive gastropathy.  No abdominal complaints.  No change in bowel habits. -Resume Protonix, Zofran as needed     Controlled type 2 diabetes mellitus without complication, without long-term current use of insulin (HCC) Controlled.  A1c 6.2.  Not on medication. - SSi- S   Alcoholic cirrhosis of liver with ascites (HCC) Alcoholic liver cirrhosis complicated by hepatic encephalopathy, portal hypertension and esophageal varices.  Hgb stable at 9.8.  Mental status stable, Bradley Miles is compliant with lactulose 3 times daily with resultant average of 3 bowel movements daily.  Gets frequent paracentesis.  Last paracentesis 10/27 at 10/20 yielding 6 and 8 L respectively.  Abdomen is distended but not to the level where Bradley Miles needs paracentesis at this time. -Resume Xifaxan and lactulose -Ammonia stable in the 80s -Check INR for MELD score calculation in a.m.   DVT prophylaxis: SCDs Code Status: Full Family Communication: None at bedside Disposition Plan:  Status is: Inpatient Remains inpatient appropriate because: Due to need for ongoing IV fluid.       Nutritional Assessment:   The patient's BMI is: Body mass index is 31.16 kg/m.Marland Kitchen   Seen by dietician.  I agree with the assessment and plan as outlined below:    Consultants:  None   Procedures:  None  Antimicrobials:  Anti-infectives (From admission, onward)    Start     Dose/Rate Route Frequency Ordered Stop   10/10/22 2200  rifaximin (XIFAXAN) tablet 550 mg        550 mg Oral 2 times daily  10/10/22 1901        Subjective: Patient seen and evaluated today with no new acute complaints or concerns. No acute concerns or events noted overnight.  Objective: Vitals:   10/12/22 1330 10/12/22 1340 10/12/22 2037 10/13/22 0500  BP: 114/66 113/70 109/64 (!) 98/48  Pulse: 100 100 96 93  Resp: 20 20 20 18   Temp:   98 F (36.7 C) 98 F (36.7 C)  TempSrc:    Oral  SpO2: 100% 100% 100% 100%  Weight:      Height:        Intake/Output Summary (Last 24 hours) at 10/13/2022 0958 Last data filed at 10/12/2022 1738 Gross per 24 hour  Intake 141.05 ml  Output --  Net 141.05 ml   Filed Weights   10/10/22 1843  Weight: 104.2 kg    Examination:  General exam: Appears calm and comfortable  Respiratory system: Clear to auscultation. Respiratory effort normal. Cardiovascular system: S1 & S2 heard, RRR.  Gastrointestinal system: Abdomen is soft, mildly distended Central nervous system: Alert and awake Extremities: No edema Skin: No significant lesions noted Psychiatry: Flat affect.    Data Reviewed: I have personally reviewed following labs and imaging studies  CBC: Recent Labs  Lab 10/10/22 1412 10/11/22 0337 10/12/22 0329 10/13/22 0605  WBC 5.0 4.8 3.5* 2.3*  NEUTROABS 3.8  --   --   --   HGB 9.8* 8.9* 8.1* 7.3*  HCT 28.1* 25.3* 23.0* 21.2*  MCV 103.3* 102.8* 104.1* 105.0*  PLT 105* 91* 82* 75*   Basic Metabolic Panel: Recent Labs  Lab 10/09/22 1115 10/10/22 1412 10/11/22 0337 10/12/22 0329 10/13/22 0605  NA 133* 131* 134* 134* 136  K 3.4* 3.2* 4.0 3.7 3.0*  CL 101 106 108 109 112*  CO2 15* 13* 17* 16* 17*  GLUCOSE 162* 291* 143* 129* 141*  BUN 46* 52* 52* 53* 49*  CREATININE 4.47* 4.37* 4.52* 4.18* 3.61*  CALCIUM 9.4 9.0 8.8* 9.0 9.1  MG  --  2.0  --  2.0 1.9   GFR: Estimated Creatinine Clearance: 29.5 mL/min (A) (by C-G formula based on SCr of 3.61 mg/dL (H)). Liver Function Tests: Recent Labs  Lab 10/09/22 1115 10/10/22 1412 10/13/22 0605   AST 39 37 31  ALT 20 23 17   ALKPHOS 198* 152* 110  BILITOT 1.5* 1.9* 1.8*  PROT 6.7 6.4* 5.8*  ALBUMIN 3.8 3.3* 3.6   Recent Labs  Lab 10/10/22 1412  LIPASE 62*   Recent Labs  Lab 10/13/22 0605  AMMONIA 85*   Coagulation Profile: Recent Labs  Lab 10/09/22 1117  INR 1.1   Cardiac Enzymes: No results for input(s): "CKTOTAL", "CKMB", "CKMBINDEX", "TROPONINI" in the last 168 hours. BNP (last 3 results) No results for input(s): "PROBNP" in the last 8760 hours. HbA1C: No results for input(s): "HGBA1C" in the last 72 hours. CBG: Recent Labs  Lab 10/12/22 0806 10/12/22 1141 10/12/22 1635 10/12/22 2040 10/13/22 0742  GLUCAP 126* 240* 172* 192* 122*   Lipid Profile: No results for input(s): "CHOL", "HDL", "LDLCALC", "TRIG", "CHOLHDL", "LDLDIRECT" in the last 72 hours. Thyroid Function Tests: No results for input(s): "TSH", "T4TOTAL", "FREET4", "T3FREE", "THYROIDAB" in the last 72 hours. Anemia Panel: No results for input(s): "VITAMINB12", "FOLATE", "FERRITIN", "TIBC", "  IRON", "RETICCTPCT" in the last 72 hours. Sepsis Labs: No results for input(s): "PROCALCITON", "LATICACIDVEN" in the last 168 hours.  Recent Results (from the past 240 hour(s))  Gram stain     Status: None   Collection Time: 10/05/22 10:25 AM   Specimen: Ascitic  Result Value Ref Range Status   Specimen Description ASCITIC  Final   Special Requests NONE  Final   Gram Stain   Final    WBC PRESENT,BOTH PMN AND MONONUCLEAR NO ORGANISMS SEEN CYTOSPIN SMEAR Performed at [facility], [address] [place], [place] [address]    Report Status 10/05/2022 FINAL  Final  Culture, body fluid w Gram Stain-bottle     Status: None   Collection Time: 10/05/22 10:25 AM   Specimen: Ascitic  Result Value Ref Range Status   Specimen Description ASCITIC  Final   Special Requests 10CC  Final   Culture   Final    NO GROWTH 5 DAYS Performed at [facility], [address] [place], [place] [address]    Report  Status 10/10/2022 FINAL  Final         Radiology Studies: US Paracentesis  Result Date: 10/12/2022 INDICATION: Cirrhosis and recurrent ascites EXAM: ULTRASOUND GUIDED LEFT PARACENTESIS MEDICATIONS: None. COMPLICATIONS: None immediate. PROCEDURE: Informed written consent was obtained from the patient after a discussion of the risks (including bleeding, infection, and bowel injury), benefits and alternatives to treatment. A timeout was performed prior to the initiation of the procedure. Initial ultrasound scanning demonstrates a large amount of ascites within the left lower abdominal quadrant. The right lower abdomen was prepped and draped in the usual sterile fashion. 1% lidocaine was used for local anesthesia. Following this, a 19 gauge, 7-cm, Yueh catheter was introduced. An ultrasound image was saved for documentation purposes. The paracentesis was performed. The catheter was removed and a dressing was applied. The patient tolerated the procedure well without immediate post procedural complication. Patient received post-procedure intravenous albumin; see nursing notes for details. FINDINGS: A total of approximately 4 L of straw-colored fluid was removed. As requested, the paracentesis was stopped at 4 L. IMPRESSION: Successful ultrasound-guided paracentesis yielding 4 liters of peritoneal fluid. Electronically Signed   By: Van Clines M.D.   On: 10/12/2022 15:15        Scheduled Meds:  allopurinol  100 mg Oral Daily   cholecalciferol  1,000 Units Oral Daily   folic acid  1 mg Oral Daily   insulin aspart  0-5 Units Subcutaneous QHS   insulin aspart  0-9 Units Subcutaneous TID WC   lactulose  30 g Oral TID   pantoprazole  40 mg Oral BID   PARoxetine  40 mg Oral Daily   potassium chloride  40 mEq Oral BID   rifaximin  550 mg Oral BID   sodium bicarbonate  1,300 mg Oral BID   thiamine  100 mg Oral Daily   vitamin B-12  100 mcg Oral Daily   zinc sulfate  220 mg Oral Daily    Continuous Infusions:  sodium chloride 75 mL/hr at 10/12/22 1738     LOS: 2 days    Time spent: 35 minutes    Ferris Tally Darleen Crocker, DO [facility]  If 7PM-7AM, please contact night-coverage [email or web address] 10/13/2022, 9:58 AM

## 2022-10-14 DIAGNOSIS — N1832 Chronic kidney disease, stage 3b: Secondary | ICD-10-CM | POA: Diagnosis not present

## 2022-10-14 DIAGNOSIS — N179 Acute kidney failure, unspecified: Secondary | ICD-10-CM | POA: Diagnosis not present

## 2022-10-14 LAB — COMPREHENSIVE METABOLIC PANEL
ALT: 19 U/L (ref 0–44)
AST: 37 U/L (ref 15–41)
Albumin: 3.6 g/dL (ref 3.5–5.0)
Alkaline Phosphatase: 117 U/L (ref 38–126)
Anion gap: 8 (ref 5–15)
BUN: 41 mg/dL — ABNORMAL HIGH (ref 6–20)
CO2: 15 mmol/L — ABNORMAL LOW (ref 22–32)
Calcium: 9.1 mg/dL (ref 8.9–10.3)
Chloride: 115 mmol/L — ABNORMAL HIGH (ref 98–111)
Creatinine, Ser: 3.16 mg/dL — ABNORMAL HIGH (ref 0.61–1.24)
GFR, Estimated: 23 mL/min — ABNORMAL LOW (ref >60)
Glucose, Bld: 140 mg/dL — ABNORMAL HIGH (ref 70–99)
Potassium: 3.4 mmol/L — ABNORMAL LOW (ref 3.5–5.1)
Sodium: 138 mmol/L (ref 135–145)
Total Bilirubin: 2 mg/dL — ABNORMAL HIGH (ref 0.3–1.2)
Total Protein: 5.7 g/dL — ABNORMAL LOW (ref 6.5–8.1)

## 2022-10-14 LAB — PROTIME-INR
INR: 1.5 — ABNORMAL HIGH (ref 0.8–1.2)
Prothrombin Time: 17.5 seconds — ABNORMAL HIGH (ref 11.4–15.2)

## 2022-10-14 LAB — GLUCOSE, CAPILLARY
Glucose-Capillary: 195 mg/dL — ABNORMAL HIGH (ref 70–99)
Glucose-Capillary: 132 mg/dL — ABNORMAL HIGH (ref 70–99)
Glucose-Capillary: 232 mg/dL — ABNORMAL HIGH (ref 70–99)
Glucose-Capillary: 165 mg/dL — ABNORMAL HIGH (ref 70–99)
Glucose-Capillary: 130 mg/dL — ABNORMAL HIGH (ref 70–99)

## 2022-10-14 LAB — CBC
HCT: 22.4 % — ABNORMAL LOW (ref 39.0–52.0)
Hemoglobin: 7.7 g/dL — ABNORMAL LOW (ref 13.0–17.0)
MCH: 36 pg — ABNORMAL HIGH (ref 26.0–34.0)
MCHC: 34.4 g/dL (ref 30.0–36.0)
MCV: 104.7 fL — ABNORMAL HIGH (ref 80.0–100.0)
Platelets: 84 10*3/uL — ABNORMAL LOW (ref 150–400)
RBC: 2.14 MIL/uL — ABNORMAL LOW (ref 4.22–5.81)
RDW: 14.6 % (ref 11.5–15.5)
WBC: 2.9 10*3/uL — ABNORMAL LOW (ref 4.0–10.5)
nRBC: 0 % (ref 0.0–0.2)

## 2022-10-14 LAB — MAGNESIUM: Magnesium: 1.7 mg/dL (ref 1.7–2.4)

## 2022-10-14 MED ORDER — POTASSIUM CHLORIDE CRYS ER 20 MEQ PO TBCR
40.0000 meq | EXTENDED_RELEASE_TABLET | Freq: Two times a day (BID) | ORAL | Status: AC
  Administered 2022-10-14 (×2): 40 meq via ORAL
  Filled 2022-10-14 (×2): qty 2

## 2022-10-14 NOTE — Progress Notes (Signed)
PROGRESS NOTE    Bradley Miles  PJK:[id number] DOB: Jun 07, 1966 DOA: 10/10/2022 PCP: [name], MD   Brief Narrative:    Bradley Miles is a 56 y.o. male with medical history significant for alcohol liver cirrhosis , hepatic encephalopathy and esophageal varices, COPD, hypertension. Patient presented to the ED with reports of abnormal labs.  He had blood work done by his primary care provider which showed elevated creatinine he was referred to the ED. he has had recent large-volume paracentesis with 6 L removed on 10/27.  He has had decreased oral intake with nausea and vomiting.  Assessment & Plan:   Principal Problem:   Acute renal failure superimposed on stage 3b chronic kidney disease, unspecified acute renal failure type (HCC) Active Problems:   Alcoholic cirrhosis of liver with ascites (HCC)   Portal hypertension (HCC)   Controlled type 2 diabetes mellitus without complication, without long-term current use of insulin (HCC)   Intractable nausea and vomiting   Hypokalemia   Metabolic acidosis   AKI (acute kidney injury) ([place])  Assessment and Plan:   Acute renal failure superimposed on stage 3b chronic kidney disease, unspecified acute renal failure type (HCC)-improving Creatinine elevated at 4.37, baseline 2.3-2.8.  Etiology likely secondary to poor oral intake, episodes of vomiting, and frequent large volume paracentesis.  No change in bowel habits. Recent hospitalization for AKI also.  Recent renal ultrasound 10/19 without obstructive uropathy. Recent paracentesis - 10/27  - 6 L. 10/30  - 8.2 L. 10/13  - 4 L -Continue gentle hydration normal saline 75cc/hr; discussed case with nephrology - Albumin 3.6, hold further albumin dosing for now as PRBC transfusion may be required   Worsening anemia of chronic disease-currently stable -Patient has required repeat PRBC transfusions in the past, no overt bleeding noted -Monitor closely and transfuse for hemoglobin less than  7   Metabolic acidosis-improving Chronic none anion gap metabolic acidosis at baseline.  Serum bicarb 15 today.  Likely from combination of renal insufficiency and bowel movements from lactulose. -Resume home bicarb  Mild hypokalemia Replete and reevaluate in a.m.   Intractable nausea and vomiting-improving Chronic.  Likely related to portal hypertensive gastropathy.  Recent EGD 08/20/2019-Grade II esophageal varices.Type 2 gastroesophageal varices. Portal hypertensive gastropathy.  No abdominal complaints.  No change in bowel habits. -Resume Protonix, Zofran as needed     Controlled type 2 diabetes mellitus without complication, without long-term current use of insulin (HCC) Controlled.  A1c 6.2.  Not on medication. - SSi- S   Alcoholic cirrhosis of liver with ascites (HCC) Alcoholic liver cirrhosis complicated by hepatic encephalopathy, portal hypertension and esophageal varices.  Hgb stable at 9.8.  Mental status stable, he is compliant with lactulose 3 times daily with resultant average of 3 bowel movements daily.  Gets frequent paracentesis.  Last paracentesis 10/27 at 10/20 yielding 6 and 8 L respectively.  Abdomen is distended but not to the level where he needs paracentesis at this time. -Resume Xifaxan and lactulose -Ammonia stable in the 80s -MELD is 25 on 11/5   DVT prophylaxis: SCDs Code Status: Full Family Communication: None at bedside Disposition Plan:  Status is: Inpatient Remains inpatient appropriate because: Due to need for ongoing IV fluid.       Nutritional Assessment:   The patient's BMI is: Body mass index is 31.16 kg/m.Marland Kitchen   Seen by dietician.  I agree with the assessment and plan as outlined below:    Consultants:  None   Procedures:  None  Antimicrobials:  Anti-infectives (From admission, onward)    Start     Dose/Rate Route Frequency Ordered Stop   10/10/22 2200  rifaximin (XIFAXAN) tablet 550 mg        550 mg Oral 2 times daily 10/10/22  1901        Subjective: Patient seen and evaluated today with no new acute complaints or concerns. No acute concerns or events noted overnight.  He appears to be tolerating diet well and is responding to IV fluid hydration.  Anticipate discharge in a.m. if further stabilized.  Objective: Vitals:   10/13/22 0500 10/13/22 1339 10/13/22 2027 10/14/22 0402  BP: (!) 98/48 (!) 107/56 116/73 104/61  Pulse: 93 (!) 106 94 94  Resp: 18 18 19 14   Temp: 98 F (36.7 C) 98 F (36.7 C) 98.2 F (36.8 C) 98.1 F (36.7 C)  TempSrc: Oral Oral  Oral  SpO2: 100% 100% 100% 99%  Weight:      Height:        Intake/Output Summary (Last 24 hours) at 10/14/2022 1156 Last data filed at 10/14/2022 0500 Gross per 24 hour  Intake 240 ml  Output --  Net 240 ml   Filed Weights   10/10/22 1843  Weight: 104.2 kg    Examination:  General exam: Appears calm and comfortable  Respiratory system: Clear to auscultation. Respiratory effort normal. Cardiovascular system: S1 & S2 heard, RRR.  Gastrointestinal system: Abdomen is distended Central nervous system: Alert and awake Extremities: No edema Skin: No significant lesions noted Psychiatry: Flat affect.    Data Reviewed: I have personally reviewed following labs and imaging studies  CBC: Recent Labs  Lab 10/10/22 1412 10/11/22 0337 10/12/22 0329 10/13/22 0605 10/13/22 0946 10/14/22 0448  WBC 5.0 4.8 3.5* 2.3*  --  2.9*  NEUTROABS 3.8  --   --   --   --   --   HGB 9.8* 8.9* 8.1* 7.3* 7.4* 7.7*  HCT 28.1* 25.3* 23.0* 21.2* 21.9* 22.4*  MCV 103.3* 102.8* 104.1* 105.0*  --  104.7*  PLT 105* 91* 82* 75*  --  84*   Basic Metabolic Panel: Recent Labs  Lab 10/10/22 1412 10/11/22 0337 10/12/22 0329 10/13/22 0605 10/14/22 0448  NA 131* 134* 134* 136 138  K 3.2* 4.0 3.7 3.0* 3.4*  CL 106 108 109 112* 115*  CO2 13* 17* 16* 17* 15*  GLUCOSE 291* 143* 129* 141* 140*  BUN 52* 52* 53* 49* 41*  CREATININE 4.37* 4.52* 4.18* 3.61* 3.16*  CALCIUM  9.0 8.8* 9.0 9.1 9.1  MG 2.0  --  2.0 1.9 1.7   GFR: Estimated Creatinine Clearance: 33.7 mL/min (A) (by C-G formula based on SCr of 3.16 mg/dL (H)). Liver Function Tests: Recent Labs  Lab 10/09/22 1115 10/10/22 1412 10/13/22 0605 10/14/22 0448  AST 39 37 31 37  ALT 20 23 17 19   ALKPHOS 198* 152* 110 117  BILITOT 1.5* 1.9* 1.8* 2.0*  PROT 6.7 6.4* 5.8* 5.7*  ALBUMIN 3.8 3.3* 3.6 3.6   Recent Labs  Lab 10/10/22 1412  LIPASE 62*   Recent Labs  Lab 10/13/22 0605  AMMONIA 85*   Coagulation Profile: Recent Labs  Lab 10/09/22 1117 10/14/22 0448  INR 1.1 1.5*   Cardiac Enzymes: No results for input(s): "CKTOTAL", "CKMB", "CKMBINDEX", "TROPONINI" in the last 168 hours. BNP (last 3 results) No results for input(s): "PROBNP" in the last 8760 hours. HbA1C: No results for input(s): "HGBA1C" in the last 72 hours. CBG: Recent Labs  Lab 10/13/22 1134 10/13/22 1621 10/13/22 2025 10/14/22 0738 10/14/22 1133  GLUCAP 195* 154* 145* 132* 232*   Lipid Profile: No results for input(s): "CHOL", "HDL", "LDLCALC", "TRIG", "CHOLHDL", "LDLDIRECT" in the last 72 hours. Thyroid Function Tests: No results for input(s): "TSH", "T4TOTAL", "FREET4", "T3FREE", "THYROIDAB" in the last 72 hours. Anemia Panel: No results for input(s): "VITAMINB12", "FOLATE", "FERRITIN", "TIBC", "IRON", "RETICCTPCT" in the last 72 hours. Sepsis Labs: No results for input(s): "PROCALCITON", "LATICACIDVEN" in the last 168 hours.  Recent Results (from the past 240 hour(s))  Gram stain     Status: None   Collection Time: 10/05/22 10:25 AM   Specimen: Ascitic  Result Value Ref Range Status   Specimen Description ASCITIC  Final   Special Requests NONE  Final   Gram Stain   Final    WBC PRESENT,BOTH PMN AND MONONUCLEAR NO ORGANISMS SEEN CYTOSPIN SMEAR Performed at [facility], [address] [place], [place] [address]    Report Status 10/05/2022 FINAL  Final  Culture, body fluid w Gram Stain-bottle      Status: None   Collection Time: 10/05/22 10:25 AM   Specimen: Ascitic  Result Value Ref Range Status   Specimen Description ASCITIC  Final   Special Requests 10CC  Final   Culture   Final    NO GROWTH 5 DAYS Performed at [facility] [address] [place], [place] [address]    Report Status 10/10/2022 FINAL  Final         Radiology Studies: US Paracentesis  Result Date: 10/12/2022 INDICATION: Cirrhosis and recurrent ascites EXAM: ULTRASOUND GUIDED LEFT PARACENTESIS MEDICATIONS: None. COMPLICATIONS: None immediate. PROCEDURE: Informed written consent was obtained from the patient after a discussion of the risks (including bleeding, infection, and bowel injury), benefits and alternatives to treatment. A timeout was performed prior to the initiation of the procedure. Initial ultrasound scanning demonstrates a large amount of ascites within the left lower abdominal quadrant. The right lower abdomen was prepped and draped in the usual sterile fashion. 1% lidocaine was used for local anesthesia. Following this, a 19 gauge, 7-cm, Yueh catheter was introduced. An ultrasound image was saved for documentation purposes. The paracentesis was performed. The catheter was removed and a dressing was applied. The patient tolerated the procedure well without immediate post procedural complication. Patient received post-procedure intravenous albumin; see nursing notes for details. FINDINGS: A total of approximately 4 L of straw-colored fluid was removed. As requested, the paracentesis was stopped at 4 L. IMPRESSION: Successful ultrasound-guided paracentesis yielding 4 liters of peritoneal fluid. Electronically Signed   By: Van Clines M.D.   On: 10/12/2022 15:15        Scheduled Meds:  allopurinol  100 mg Oral Daily   cholecalciferol  1,000 Units Oral Daily   folic acid  1 mg Oral Daily   insulin aspart  0-5 Units Subcutaneous QHS   insulin aspart  0-9 Units Subcutaneous TID WC   lactulose  30  g Oral TID   pantoprazole  40 mg Oral BID   PARoxetine  40 mg Oral Daily   rifaximin  550 mg Oral BID   sodium bicarbonate  1,300 mg Oral BID   thiamine  100 mg Oral Daily   vitamin B-12  100 mcg Oral Daily   zinc sulfate  220 mg Oral Daily   Continuous Infusions:  sodium chloride 75 mL/hr at 10/13/22 1832     LOS: 3 days    Time spent: 35 minutes    Ryen Heitmeyer  Darleen Crocker, DO [facility]  If 7PM-7AM, please contact night-coverage [email or web address] 10/14/2022, 11:56 AM

## 2022-10-14 NOTE — TOC Initial Note (Signed)
Transition of Care [facility]) - Initial/Assessment Note    Patient Details  Name: Bradley Miles MRN: [id number] Date of Birth: 1966-02-14  [facility]) CM/SW Contact:    Iona Beard, [place] Phone Number: 10/14/2022, 1:44 PM  Clinical Narrative:                 Pt is high risk for readmission. CSW spoke with pts wife in room to complete assessment. Pt lives with wife. Pt needs assistance with ADLs but is able to get to the bathroom independently. Pt has not had [place] services. Pt has a cane and wheelchair. Pts wife states that she is thinking about getting pt a rollator. TOC to follow for needs.   Expected Discharge Plan: Home/Self Care Barriers to Discharge: Continued Medical Work up   Patient Goals and CMS Choice Patient states their goals for this hospitalization and ongoing recovery are:: return home CMS Medicare.gov Compare Post Acute Care list provided to:: Patient Choice offered to / list presented to : Patient, Spouse  Expected Discharge Plan and Services Expected Discharge Plan: Home/Self Care In-house Referral: Clinical Social Work Discharge Planning Services: CM Consult   Living arrangements for the past 2 months: Single Family Home                                      Prior Living Arrangements/Services Living arrangements for the past 2 months: Single Family Home Lives with:: Spouse Patient language and need for interpreter reviewed:: Yes Do you feel safe going back to the place where you live?: Yes      Need for Family Participation in Patient Care: Yes (Comment) Care giver support system in place?: Yes (comment) Current home services: DME Criminal Activity/Legal Involvement Pertinent to Current Situation/Hospitalization: No - Comment as needed  Activities of Daily Living Home Assistive Devices/Equipment: Cane (specify quad or straight), Wheelchair ADL Screening (condition at time of admission) Patient's cognitive ability adequate to safely  complete daily activities?: Yes Is the patient deaf or have difficulty hearing?: No Does the patient have difficulty seeing, even when wearing glasses/contacts?: No Does the patient have difficulty concentrating, remembering, or making decisions?: No Patient able to express need for assistance with ADLs?: Yes Does the patient have difficulty dressing or bathing?: No Independently performs ADLs?: Yes (appropriate for developmental age) Does the patient have difficulty walking or climbing stairs?: No Weakness of Legs: None Weakness of Arms/Hands: None  Permission Sought/Granted                  Emotional Assessment Appearance:: Appears stated age     Orientation: : Oriented to Self, Oriented to Place, Oriented to  Time, Oriented to Situation Alcohol / Substance Use: Not Applicable Psych Involvement: No (comment)  Admission diagnosis:  Alcoholic cirrhosis of liver with ascites ([place]) [K70.31] AKI (acute kidney injury) ([place]) [N17.9] Patient Active Problem List   Diagnosis Date Noted   AKI (acute kidney injury) ([place]) 29/92/4268   Metabolic acidosis 34/19/6222   Acute renal failure superimposed on stage 3b chronic kidney disease, unspecified acute renal failure type ([place]) 09/26/2022   Intractable nausea and vomiting 09/26/2022   Hypokalemia 09/26/2022   Need for shingles vaccine 09/20/2022   Need for influenza vaccination 09/20/2022   Hepatic encephalopathy ([place]) 08/16/2022   Acute kidney injury ([place]) 08/16/2022   Elevated AFP 10/02/2021   S/P alcohol detoxification 08/24/2021   Depression 08/24/2021  Hematemesis 11/15/2020   GIB (gastrointestinal bleeding) 08/05/2020   Acute GI bleeding 08/05/2020   Gastric varices    Thrombocytopenia ([place]) 06/26/2020   Controlled type 2 diabetes mellitus without complication, without long-term current use of insulin ([place]) 06/26/2020   Tussive syncope 12/22/2019   Acute blood loss anemia    Alcohol dependence with inpatient treatment  [facility])    Esophageal varices ([place])    Hematemesis/vomiting blood 11/20/2019   RUQ pain 07/30/2019   Abnormal LFTs 07/30/2019   Bilateral leg edema 06/03/2019   Portal hypertension ([place]) 11/26/2018   Hepatic steatosis 06/27/2018   Abdominal pain    Alcoholic hepatitis without ascites    Acute hepatic encephalopathy ([place]) 06/01/2018   Acute respiratory failure with hypoxia ([place]) 21/22/4825   Alcoholic cirrhosis of liver with ascites ([place]) 06/01/2018   COPD (chronic obstructive pulmonary disease) ([place]) 00/37/0488   Alcoholic peripheral neuropathy ([place]) 06/01/2018   Increased ammonia level    COPD, mild ([place]) 05/14/2017   Right leg claudication ([place]) 05/14/2017   Prediabetes 02/04/2017   Chest pain 12/21/2015   Right leg DVT ([place]) 07/18/2015   Elevated transaminase measurement 07/18/2015   Sensory neuropathy 07/16/2015   Anxiety 12/23/2014   EtOH dependence ([place]) 02/09/2014   Headache in back of head 01/12/2014   Superficial thrombosis of lower extremity 07/01/2013   Hematochezia 11/17/2012   Mixed hyperlipidemia 02/15/2012   Hypertension 01/18/2012   Insomnia 01/18/2012   Family history of colonic polyps 11/15/2011   Palpitations 10/04/2011   TOBACCO ABUSE 05/18/2010   GERD 05/18/2010   CONSTIPATION 05/18/2010   ERECTILE DYSFUNCTION, 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/18/2010   DYSPHAGIA UNSPECIFIED 05/18/2010   ABDOMINAL PAIN, RIGHT LOWER QUADRANT 05/18/2010   PCP:  [name], MD Pharmacy:   [facility] [phone] - [place], [place] AT [place] [address] [place] [address] Phone: [phone] Fax: [phone]     Social Determinants of Health (SDOH) Interventions    Readmission Risk Interventions    10/14/2022    1:43 PM  Readmission Risk Prevention Plan  Transportation Screening Complete  HRI or Home Care Consult Complete  Social Work Consult for [place] Planning/Counseling Complete  Palliative Care Screening Not  Applicable  Medication Review Press photographer) Complete

## 2022-10-15 DIAGNOSIS — N179 Acute kidney failure, unspecified: Secondary | ICD-10-CM | POA: Diagnosis not present

## 2022-10-15 DIAGNOSIS — N1832 Chronic kidney disease, stage 3b: Secondary | ICD-10-CM | POA: Diagnosis not present

## 2022-10-15 LAB — CBC
HCT: 22.9 % — ABNORMAL LOW (ref 39.0–52.0)
Hemoglobin: 7.8 g/dL — ABNORMAL LOW (ref 13.0–17.0)
MCH: 36.4 pg — ABNORMAL HIGH (ref 26.0–34.0)
MCHC: 34.1 g/dL (ref 30.0–36.0)
MCV: 107 fL — ABNORMAL HIGH (ref 80.0–100.0)
Platelets: 87 10*3/uL — ABNORMAL LOW (ref 150–400)
RBC: 2.14 MIL/uL — ABNORMAL LOW (ref 4.22–5.81)
RDW: 14.8 % (ref 11.5–15.5)
WBC: 2.3 10*3/uL — ABNORMAL LOW (ref 4.0–10.5)
nRBC: 0 % (ref 0.0–0.2)

## 2022-10-15 LAB — MAGNESIUM: Magnesium: 1.7 mg/dL (ref 1.7–2.4)

## 2022-10-15 LAB — BASIC METABOLIC PANEL
Anion gap: 8 (ref 5–15)
BUN: 39 mg/dL — ABNORMAL HIGH (ref 6–20)
CO2: 14 mmol/L — ABNORMAL LOW (ref 22–32)
Calcium: 8.7 mg/dL — ABNORMAL LOW (ref 8.9–10.3)
Chloride: 115 mmol/L — ABNORMAL HIGH (ref 98–111)
Creatinine, Ser: 2.85 mg/dL — ABNORMAL HIGH (ref 0.61–1.24)
GFR, Estimated: 26 mL/min — ABNORMAL LOW (ref >60)
Glucose, Bld: 160 mg/dL — ABNORMAL HIGH (ref 70–99)
Potassium: 3.9 mmol/L (ref 3.5–5.1)
Sodium: 137 mmol/L (ref 135–145)

## 2022-10-15 LAB — GLUCOSE, CAPILLARY
Glucose-Capillary: 114 mg/dL — ABNORMAL HIGH (ref 70–99)
Glucose-Capillary: 195 mg/dL — ABNORMAL HIGH (ref 70–99)

## 2022-10-15 NOTE — Progress Notes (Signed)
Patient discharged. IV removed. Discharge paper work given and reviewed with patient and patients wife. Patient wheeled down to private vehicle via wheelchair. No sign of pain or distress noted.

## 2022-10-15 NOTE — TOC Transition Note (Signed)
Transition of Care [facility]) - CM/SW Discharge Note   Patient Details  Name: Bradley Miles MRN: [id number] Date of Birth: 04-30-1966  [facility]) CM/SW Contact:  Salome Arnt, LCSW Phone Number: 10/15/2022, 11:10 AM   Clinical Narrative: Pt d/c today. Pt's wife requesting rollator. Agreeable to refer to [facility]. Referral made to [facility] with [facility] to drop ship. Order in. No other needs reported.       Final next level of care: Home/Self Care Barriers to Discharge: Barriers Resolved   Patient Goals and CMS Choice Patient states their goals for this hospitalization and ongoing recovery are:: return home CMS Medicare.gov Compare Post Acute Care list provided to:: Patient Choice offered to / list presented to : Patient, Spouse  Discharge Placement                  Name of family member notified: wife Patient and family notified of of transfer: 10/15/22  Discharge Plan and Services In-house Referral: Clinical Social Work Discharge Planning Services: CM Consult            DME Arranged: Gilford Rile rolling with seat DME Agency: [facility] Date DME Agency Contacted: 10/15/22 Time DME Agency Contacted: 1109 Representative spoke with at DME Agency: [place] ([place]) Interventions     Readmission Risk Interventions    10/14/2022    1:43 PM  Readmission Risk Prevention Plan  Transportation Screening Complete  HRI or [place] Complete  Social Work Consult for [place] Planning/Counseling Complete  Palliative Care Screening Not Applicable  Medication Review Press photographer) Complete

## 2022-10-15 NOTE — Discharge Summary (Signed)
Physician Discharge Summary  Bradley Miles MVH:[id number] DOB: 13-May-1966 DOA: 10/10/2022  PCP: [name], MD  Admit date: 10/10/2022  Discharge date: 10/15/2022  Admitted From:Home  Disposition:  Home  Recommendations for Outpatient Follow-up:  Follow up with PCP in 1-2 weeks and follow-up BMP and CBC in 1 week Continue with paracentesis as previously scheduled with albumin Continue home medications as prior  Home Health: None  Equipment/Devices: Rollator  Discharge Condition:Stable  CODE STATUS: Full  Diet recommendation: Heart Healthy/carb modified  Brief/Interim Summary:  Bradley Miles is a 56 y.o. male with medical history significant for alcohol liver cirrhosis , hepatic encephalopathy and esophageal varices, COPD, hypertension. Patient presented to the ED with reports of abnormal labs.  He had blood work done by his primary care provider which showed elevated creatinine he was referred to the ED. he has had recent large-volume paracentesis with 6 L removed on 10/27.  He has had decreased oral intake with nausea and vomiting likely related to portal gastropathy that has now resolved.  He has undergone a 4 L paracentesis on 11/3 per his usual schedule while hospitalized.  He has continued to receive some doses of albumin as well as IV fluid with gradual improvement in his creatinine level near baseline of 2.3-2.8.  He is in stable condition for discharge today with no further acute events or issues during the course of this hospitalization and may resume his usual home medications as prior.  Discharge Diagnoses:  Principal Problem:   Acute renal failure superimposed on stage 3b chronic kidney disease, unspecified acute renal failure type (HCC) Active Problems:   Alcoholic cirrhosis of liver with ascites (HCC)   Portal hypertension (HCC)   Controlled type 2 diabetes mellitus without complication, without long-term current use of insulin (HCC)   Intractable nausea  and vomiting   Hypokalemia   Metabolic acidosis   AKI (acute kidney injury) ([place])  Principal discharge diagnosis: AKI on CKD stage IIIb-prerenal in the setting of intractable nausea and vomiting.  Stable alcoholic liver cirrhosis with ascites status post paracentesis.  Discharge Instructions  Discharge Instructions     Diet - low sodium heart healthy   Complete by: As directed    Increase activity slowly   Complete by: As directed       Allergies as of 10/15/2022   No Known Allergies      Medication List     TAKE these medications    allopurinol 100 MG tablet Commonly known as: ZYLOPRIM Take 1 tablet (100 mg total) by mouth daily.   folic acid 1 MG tablet Commonly known as: FOLVITE Take 1 mg by mouth daily.   lactulose 10 GM/15ML solution Commonly known as: CHRONULAC Take 45 mLs (30 g total) by mouth every 6 (six) hours. Titrate as needed to have 2-4 bowel movements daily.   ondansetron 8 MG tablet Commonly known as: ZOFRAN Take 1 tablet (8 mg total) by mouth every 8 (eight) hours as needed for up to 60 doses for nausea or vomiting.   pantoprazole 40 MG tablet Commonly known as: PROTONIX Take 40 mg by mouth 2 (two) times daily.   PARoxetine 40 MG tablet Commonly known as: PAXIL Take 1 tablet (40 mg total) by mouth daily.   sodium bicarbonate 650 MG tablet Take 2 tablets (1,300 mg total) by mouth 2 (two) times daily.   thiamine 100 MG tablet Commonly known as: VITAMIN B1 Take 100 mg by mouth daily.   vitamin B-12 100 MCG tablet  Commonly known as: CYANOCOBALAMIN Take 100 mcg by mouth daily.   Vitamin D3 1.25 MG (50000 UT) Caps Take 1 each by mouth daily.   Xifaxan 550 MG Tabs tablet Generic drug: rifaximin TAKE 1 TABLET(550 MG) BY MOUTH TWICE DAILY What changed: See the new instructions.   zinc sulfate 220 (50 Zn) MG capsule Take 50 mg by mouth daily.               Durable Medical Equipment  (From admission, onward)           Start      Ordered   10/15/22 1103  For home use only DME 4 wheeled rolling walker with seat  Once       Question:  Patient needs a walker to treat with the following condition  Answer:  Weakness   10/15/22 1102            Follow-up Information     [name], MD. Schedule an appointment as soon as possible for a visit in 1 week(s).   Specialty: Internal Medicine Contact information: [address] [place] [address] [phone]                No Known Allergies  Consultations: None   Procedures/Studies: US Paracentesis  Result Date: 10/12/2022 INDICATION: Cirrhosis and recurrent ascites EXAM: ULTRASOUND GUIDED LEFT PARACENTESIS MEDICATIONS: None. COMPLICATIONS: None immediate. PROCEDURE: Informed written consent was obtained from the patient after a discussion of the risks (including bleeding, infection, and bowel injury), benefits and alternatives to treatment. A timeout was performed prior to the initiation of the procedure. Initial ultrasound scanning demonstrates a large amount of ascites within the left lower abdominal quadrant. The right lower abdomen was prepped and draped in the usual sterile fashion. 1% lidocaine was used for local anesthesia. Following this, a 19 gauge, 7-cm, Yueh catheter was introduced. An ultrasound image was saved for documentation purposes. The paracentesis was performed. The catheter was removed and a dressing was applied. The patient tolerated the procedure well without immediate post procedural complication. Patient received post-procedure intravenous albumin; see nursing notes for details. FINDINGS: A total of approximately 4 L of straw-colored fluid was removed. As requested, the paracentesis was stopped at 4 L. IMPRESSION: Successful ultrasound-guided paracentesis yielding 4 liters of peritoneal fluid. Electronically Signed   By: Van Clines M.D.   On: 10/12/2022 15:15   US Paracentesis  Result Date:  10/05/2022 INDICATION: Cirrhosis, ascites EXAM: ULTRASOUND GUIDED DIAGNOSTIC AND THERAPEUTIC PARACENTESIS MEDICATIONS: None. COMPLICATIONS: None immediate. PROCEDURE: Informed written consent was obtained from the patient after a discussion of the risks, benefits and alternatives to treatment. A timeout was performed prior to the initiation of the procedure. Initial ultrasound scanning demonstrates a large amount of ascites within the LEFT lower abdominal quadrant. The right lower abdomen was prepped and draped in the usual sterile fashion. 1% lidocaine was used for local anesthesia. Following this, a 19 gauge, 7-cm, Yueh catheter was introduced. An ultrasound image was saved for documentation purposes. The paracentesis was performed. The catheter was removed and a dressing was applied. The patient tolerated the procedure well without immediate post procedural complication. Patient received post-procedure intravenous albumin; see nursing notes for details. FINDINGS: A total of approximately 6 L of cloudy yellow fluid was removed. Samples were sent to the laboratory as requested by the clinical team. IMPRESSION: Successful ultrasound-guided paracentesis yielding 6 liters of peritoneal fluid. Electronically Signed   By: Lavonia Dana M.D.   On:  10/05/2022 11:36   US Paracentesis  Result Date: 09/28/2022 INDICATION: Cirrhosis EXAM: ULTRASOUND GUIDED  PARACENTESIS MEDICATIONS: None. COMPLICATIONS: None immediate. PROCEDURE: Informed written consent was obtained from the patient after a discussion of the risks, benefits and alternatives to treatment. A timeout was performed prior to the initiation of the procedure. Initial ultrasound scanning demonstrates a large amount of ascites within the left mid abdominal quadrant. The abdomen was prepped and draped in the usual sterile fashion. 1% lidocaine was used for local anesthesia. Following this, a 6 Fr Safe-T-Centesis catheter was introduced. An ultrasound image was  saved for documentation purposes. The paracentesis was performed. The catheter was removed and a dressing was applied. The patient tolerated the procedure well without immediate post procedural complication. FINDINGS: A total of approximately 8.2 L of serous fluid was removed. IMPRESSION: Successful ultrasound-guided paracentesis yielding 8.2 liters of peritoneal fluid. Electronically Signed   By: Abigail Miyamoto M.D.   On: 09/28/2022 10:31   US RENAL  Result Date: 09/27/2022 CLINICAL DATA:  Acute renal failure.  Chronic kidney disease EXAM: RENAL / URINARY TRACT ULTRASOUND COMPLETE COMPARISON:  02/08/2021 FINDINGS: Right Kidney: Renal measurements: 10.7 x 5.0 x 4.8 cm = volume: 134 mL. Slightly increased cortical echogenicity. No mass or hydronephrosis visualized. Left Kidney: Renal measurements: 11.7 x 6.2 x 5.5 cm = volume: 208 mL. Slightly increased cortical echogenicity. No mass or hydronephrosis visualized. Bladder: Appears normal for degree of bladder distention. Other: Moderate volume ascites. IMPRESSION: 1. No evidence of obstructive uropathy. 2. Slightly increased cortical echogenicity of the bilateral kidneys as can be seen with chronic medical renal disease. 3. Moderate volume ascites. Electronically Signed   By: Davina Poke D.O.   On: 09/27/2022 12:01   US Paracentesis  Result Date: 09/21/2022 INDICATION: Alcoholic cirrhosis, ascites EXAM: ULTRASOUND GUIDED DIAGNOSTIC AND THERAPEUTIC PARACENTESIS MEDICATIONS: None. COMPLICATIONS: None immediate. PROCEDURE: Informed written consent was obtained from the patient after a discussion of the risks, benefits and alternatives to treatment. A timeout was performed prior to the initiation of the procedure. Initial ultrasound scanning demonstrates a large amount of ascites within the LEFT lower abdominal quadrant. The right lower abdomen was prepped and draped in the usual sterile fashion. 1% lidocaine was used for local anesthesia. Following this, a  19 gauge, 7-cm, Yueh catheter was introduced. An ultrasound image was saved for documentation purposes. The paracentesis was performed. The catheter was removed and a dressing was applied. The patient tolerated the procedure well without immediate post procedural complication. Patient received post-procedure intravenous albumin; see nursing notes for details. FINDINGS: A total of approximately 4.0 L of yellow ascitic fluid was removed. Samples were sent to the laboratory as requested by the clinical team. IMPRESSION: Successful ultrasound-guided paracentesis yielding 4.0 liters of peritoneal fluid. Electronically Signed   By: Lavonia Dana M.D.   On: 09/21/2022 12:04     Discharge Exam: Vitals:   10/14/22 2116 10/15/22 0620  BP: 108/67 119/70  Pulse: 100 97  Resp:  15  Temp: 98.1 F (36.7 C) 98.2 F (36.8 C)  SpO2: 100% 98%   Vitals:   10/14/22 0402 10/14/22 1400 10/14/22 2116 10/15/22 0620  BP: 104/61 110/76 108/67 119/70  Pulse: 94 (!) 108 100 97  Resp: 14 17  15   Temp: 98.1 F (36.7 C) 97.9 F (36.6 C) 98.1 F (36.7 C) 98.2 F (36.8 C)  TempSrc: Oral Axillary Oral Oral  SpO2: 99% 100% 100% 98%  Weight:      Height:  General: Pt is alert, awake, not in acute distress Cardiovascular: RRR, S1/S2 +, no rubs, no gallops Respiratory: CTA bilaterally, no wheezing, no rhonchi Abdominal: Soft, NT, mildly distended, bowel sounds + Extremities: no edema, no cyanosis    The results of significant diagnostics from this hospitalization (including imaging, microbiology, ancillary and laboratory) are listed below for reference.     Microbiology: No results found for this or any previous visit (from the past 240 hour(s)).   Labs: BNP (last 3 results) No results for input(s): "BNP" in the last 8760 hours. Basic Metabolic Panel: Recent Labs  Lab 10/10/22 1412 10/11/22 0337 10/12/22 0329 10/13/22 0605 10/14/22 0448 10/15/22 1015  NA 131* 134* 134* 136 138 137  K 3.2* 4.0  3.7 3.0* 3.4* 3.9  CL 106 108 109 112* 115* 115*  CO2 13* 17* 16* 17* 15* 14*  GLUCOSE 291* 143* 129* 141* 140* 160*  BUN 52* 52* 53* 49* 41* 39*  CREATININE 4.37* 4.52* 4.18* 3.61* 3.16* 2.85*  CALCIUM 9.0 8.8* 9.0 9.1 9.1 8.7*  MG 2.0  --  2.0 1.9 1.7 1.7   Liver Function Tests: Recent Labs  Lab 10/09/22 1115 10/10/22 1412 10/13/22 0605 10/14/22 0448  AST 39 37 31 37  ALT 20 23 17 19   ALKPHOS 198* 152* 110 117  BILITOT 1.5* 1.9* 1.8* 2.0*  PROT 6.7 6.4* 5.8* 5.7*  ALBUMIN 3.8 3.3* 3.6 3.6   Recent Labs  Lab 10/10/22 1412  LIPASE 62*   Recent Labs  Lab 10/13/22 0605  AMMONIA 85*   CBC: Recent Labs  Lab 10/10/22 1412 10/11/22 0337 10/12/22 0329 10/13/22 0605 10/13/22 0946 10/14/22 0448 10/15/22 1015  WBC 5.0 4.8 3.5* 2.3*  --  2.9* 2.3*  NEUTROABS 3.8  --   --   --   --   --   --   HGB 9.8* 8.9* 8.1* 7.3* 7.4* 7.7* 7.8*  HCT 28.1* 25.3* 23.0* 21.2* 21.9* 22.4* 22.9*  MCV 103.3* 102.8* 104.1* 105.0*  --  104.7* 107.0*  PLT 105* 91* 82* 75*  --  84* 87*   Cardiac Enzymes: No results for input(s): "CKTOTAL", "CKMB", "CKMBINDEX", "TROPONINI" in the last 168 hours. BNP: Invalid input(s): "POCBNP" CBG: Recent Labs  Lab 10/14/22 0738 10/14/22 1133 10/14/22 1531 10/14/22 2115 10/15/22 0810  GLUCAP 132* 232* 165* 130* 114*   D-Dimer No results for input(s): "DDIMER" in the last 72 hours. Hgb A1c No results for input(s): "HGBA1C" in the last 72 hours. Lipid Profile No results for input(s): "CHOL", "HDL", "LDLCALC", "TRIG", "CHOLHDL", "LDLDIRECT" in the last 72 hours. Thyroid function studies No results for input(s): "TSH", "T4TOTAL", "T3FREE", "THYROIDAB" in the last 72 hours.  Invalid input(s): "FREET3" Anemia work up No results for input(s): "VITAMINB12", "FOLATE", "FERRITIN", "TIBC", "IRON", "RETICCTPCT" in the last 72 hours. Urinalysis    Component Value Date/Time   COLORURINE YELLOW 09/26/2022 1810   APPEARANCEUR CLEAR 09/26/2022 1810    LABSPEC 1.012 09/26/2022 1810   PHURINE 6.0 09/26/2022 1810   GLUCOSEU NEGATIVE 09/26/2022 1810   HGBUR NEGATIVE 09/26/2022 1810   BILIRUBINUR NEGATIVE 09/26/2022 1810   KETONESUR NEGATIVE 09/26/2022 1810   PROTEINUR NEGATIVE 09/26/2022 1810   NITRITE NEGATIVE 09/26/2022 1810   LEUKOCYTESUR NEGATIVE 09/26/2022 1810   Sepsis Labs Recent Labs  Lab 10/12/22 0329 10/13/22 0605 10/14/22 0448 10/15/22 1015  WBC 3.5* 2.3* 2.9* 2.3*   Microbiology No results found for this or any previous visit (from the past 240 hour(s)).   Time coordinating discharge: 35 minutes  SIGNED:   Rodena Goldmann, DO [facility] 10/15/2022, 11:05 AM  If 7PM-7AM, please contact night-coverage [email or web address]

## 2022-10-16 ENCOUNTER — Telehealth: Payer: Self-pay | Admitting: *Deleted

## 2022-10-16 NOTE — Telephone Encounter (Signed)
Pt's wife  [name] (DPR) called and states he needs a standing order for 6 liters of fluid to be removed when he has a paracentesis tomorrow.

## 2022-10-16 NOTE — Telephone Encounter (Signed)
Noted  

## 2022-10-17 ENCOUNTER — Encounter (HOSPITAL_COMMUNITY): Payer: Self-pay

## 2022-10-17 ENCOUNTER — Ambulatory Visit (HOSPITAL_COMMUNITY)
Admission: RE | Admit: 2022-10-17 | Discharge: 2022-10-17 | Disposition: A | Discharge status: Home / Self Care | Payer: Managed Care, Other (non HMO) | Source: Ambulatory Visit | Attending: Gastroenterology | Admitting: Gastroenterology

## 2022-10-17 DIAGNOSIS — R188 Other ascites: Secondary | ICD-10-CM | POA: Diagnosis present

## 2022-10-17 LAB — GRAM STAIN: Gram Stain: NONE SEEN

## 2022-10-17 LAB — BODY FLUID CELL COUNT WITH DIFFERENTIAL
Eos, Fluid: 0 %
Lymphs, Fluid: 29 %
Monocyte-Macrophage-Serous Fluid: 66 % (ref 50–90)
Neutrophil Count, Fluid: 5 % (ref 0–25)
Total Nucleated Cell Count, Fluid: 50 cu mm (ref 0–1000)

## 2022-10-17 MED ORDER — ALBUMIN HUMAN 25 % IV SOLN
INTRAVENOUS | Status: AC
  Filled 2022-10-17: qty 200

## 2022-10-17 MED ORDER — ALBUMIN HUMAN 25 % IV SOLN: 0.0000 g | Freq: Once | INTRAVENOUS | Status: DC

## 2022-10-17 NOTE — Progress Notes (Signed)
Paracentesis complete no signs of distress.  

## 2022-10-17 NOTE — Procedures (Signed)
PROCEDURE SUMMARY:  Successful ultrasound guided paracentesis from the right lower quadrant.  Yielded 8L of cloudy yellow fluid.  No immediate complications.  The patient tolerated the procedure well.   Specimen was sent for labs.  EBL < 79mL  The patient has required >/=2 paracenteses in a 30 day period and a screening evaluation by the [facility] has been arranged.   Electronically Signed: Pasty Spillers, PA-C 10/17/2022, 1:33 PM

## 2022-10-18 ENCOUNTER — Ambulatory Visit (INDEPENDENT_AMBULATORY_CARE_PROVIDER_SITE_OTHER): Payer: Managed Care, Other (non HMO) | Admitting: Internal Medicine

## 2022-10-18 ENCOUNTER — Encounter: Payer: Self-pay | Admitting: Internal Medicine

## 2022-10-18 VITALS — BP 112/68 | HR 114 | Ht 72.0 in | Wt 230.6 lb

## 2022-10-18 DIAGNOSIS — K7031 Alcoholic cirrhosis of liver with ascites: Secondary | ICD-10-CM

## 2022-10-18 DIAGNOSIS — N1832 Chronic kidney disease, stage 3b: Secondary | ICD-10-CM | POA: Diagnosis not present

## 2022-10-18 DIAGNOSIS — N179 Acute kidney failure, unspecified: Secondary | ICD-10-CM

## 2022-10-18 DIAGNOSIS — R112 Nausea with vomiting, unspecified: Secondary | ICD-10-CM | POA: Diagnosis not present

## 2022-10-18 MED ORDER — PROCHLORPERAZINE MALEATE 5 MG PO TABS: 5.0000 mg | ORAL_TABLET | Freq: Four times a day (QID) | ORAL | 0 refills | Status: DC | PRN

## 2022-10-18 NOTE — Assessment & Plan Note (Signed)
Recent hospital admission 11/1 - 11/6 for AKI on CKD 3B.  Creatinine 4.37 at the time of admission, baseline 2.3-2.8.  Prerenal etiology suspected in the setting of poor oral intake with intractable nausea and vomiting.  He was gently diuresed and received albumin during admission.  Creatinine improved to 2.85 on the day of discharge. -Repeat labs ordered today -Plan for follow-up in 1 month

## 2022-10-18 NOTE — Progress Notes (Signed)
TOC Office Visit  Subjective   Patient ID: Bradley Miles, male    DOB: 10-Jun-1966  Age: 56 y.o. MRN: [id number]  Chief Complaint  Patient presents with   Transitions Of Care    D/c 11/6   Mr. [name] presents today for a [facility] appointment in the setting of recent hospital admission 11/1 - 11/6 for AKI on CKD stage IIIb.  Prerenal etiology was suspected in the setting of intractable nausea with vomiting.  During admission he was treated with albumin and gentle IV fluid hydration.  Creatinine returned to baseline prior to discharge.  There have been no acute interval events since hospital discharge.  [name] underwent paracentesis yesterday (11/8) with 8 L of cloudy yellow fluid removed. Today he endorses fatigue but otherwise reports feeling well. He is accompanied by his wife today. They will be traveling to [facility] in the coming days for a week of appointments and further testing and part of preparation for transplant.   Past Medical History:  Diagnosis Date   Alcoholic cirrhosis ([place])    Anxiety    Controlled type 2 diabetes mellitus with complication, without long-term current use of insulin ([place]) 06/26/2020   Enlarged liver    GERD (gastroesophageal reflux disease)    Hypertension    Palpitations    PVC's (premature ventricular contractions)    Shortness of breath    Tussive syncope 12/22/2019   Varicose veins    Past Surgical History:  Procedure Laterality Date   BIOPSY  02/02/2019   Procedure: BIOPSY;  Surgeon: Daneil Dolin, MD;  [facility];  Service: Endoscopy;;  gastric   COLONOSCOPY WITH ESOPHAGOGASTRODUODENOSCOPY (EGD)  12/16/2012   internal hemorrhoids, colonic diverticulosis, benign polyps, screening in 2024. EGD with mild erosive reflux esophagitis, small hiatal hernia, negative H.pylori   COLONOSCOPY WITH PROPOFOL N/A 06/22/2021   Procedure: COLONOSCOPY WITH PROPOFOL;  Surgeon: Daneil Dolin, MD;  [facility];  Service: Endoscopy;  Laterality:  N/A;  1:45pm   cyst reomved     from spine   ESOPHAGOGASTRODUODENOSCOPY  05/19/09   RMR: Geographic distal esophageal erosions consistent with severe erosive reflux esophagitis. schatzki's ring s/P dilation/small hiatal hernia otherwise normal stomach   ESOPHAGOGASTRODUODENOSCOPY (EGD) WITH PROPOFOL N/A 02/02/2019   Dr. Gala Romney: retained gastric contents. portal HTN gastropathy   ESOPHAGOGASTRODUODENOSCOPY (EGD) WITH PROPOFOL N/A 11/20/2019   Procedure: ESOPHAGOGASTRODUODENOSCOPY (EGD) WITH PROPOFOL;  Surgeon: Daneil Dolin, MD;  [facility];  Service: Endoscopy;  Laterality: N/A;   ESOPHAGOGASTRODUODENOSCOPY (EGD) WITH PROPOFOL N/A 11/24/2019   Procedure: ESOPHAGOGASTRODUODENOSCOPY (EGD) WITH PROPOFOL;  Surgeon: Ronnette Juniper, MD;  Location: [place];  Service: Gastroenterology;  Laterality: N/A;   ESOPHAGOGASTRODUODENOSCOPY (EGD) WITH PROPOFOL N/A 08/08/2020   Procedure: ESOPHAGOGASTRODUODENOSCOPY (EGD) WITH PROPOFOL;  Surgeon: Daneil Dolin, MD;  [facility];  Service: Endoscopy;  Laterality: N/A;   ESOPHAGOGASTRODUODENOSCOPY (EGD) WITH PROPOFOL N/A 11/15/2020   Procedure: ESOPHAGOGASTRODUODENOSCOPY (EGD) WITH PROPOFOL;  Surgeon: Ladene Artist, MD;  Location: [facility];  Service: Endoscopy;  Laterality: N/A;   ESOPHAGOGASTRODUODENOSCOPY (EGD) WITH PROPOFOL N/A 08/19/2022   Procedure: ESOPHAGOGASTRODUODENOSCOPY (EGD) WITH PROPOFOL;  Surgeon: Harvel Quale, MD;  [facility];  Service: Gastroenterology;  Laterality: N/A;   IR ANGIOGRAM SELECTIVE EACH ADDITIONAL VESSEL  11/22/2019   IR ANGIOGRAM SELECTIVE EACH ADDITIONAL VESSEL  11/24/2019   IR ANGIOGRAM SELECTIVE EACH ADDITIONAL VESSEL  11/24/2019   IR ANGIOGRAM SELECTIVE EACH ADDITIONAL VESSEL  11/24/2019   IR ANGIOGRAM SELECTIVE  EACH ADDITIONAL VESSEL  11/15/2020   IR EMBO ART  VEN HEMORR LYMPH EXTRAV  INC GUIDE ROADMAPPING  11/22/2019   IR EMBO ART  VEN HEMORR LYMPH EXTRAV  INC GUIDE  ROADMAPPING  11/24/2019   IR EMBO ART  VEN HEMORR LYMPH EXTRAV  INC GUIDE ROADMAPPING  11/15/2020   IR FLUORO GUIDE CV LINE RIGHT  11/15/2020   IR RADIOLOGIST EVAL & MGMT  12/29/2019   IR RADIOLOGIST EVAL & MGMT  10/27/2020   IR RADIOLOGIST EVAL & MGMT  04/19/2021   IR TIPS  11/24/2019   IR US GUIDE VASC ACCESS RIGHT  11/22/2019   IR US GUIDE VASC ACCESS RIGHT  11/15/2020   IR US GUIDE VASC ACCESS RIGHT  11/15/2020   IR VENOGRAM RENAL UNI LEFT  11/22/2019   IR VENOGRAM RENAL UNI LEFT  11/15/2020   POLYPECTOMY  06/22/2021   Procedure: POLYPECTOMY;  Surgeon: Daneil Dolin, MD;  [facility];  Service: Endoscopy;;   RADIOLOGY WITH ANESTHESIA N/A 11/24/2019   Procedure: IR WITH ANESTHESIA;  Surgeon: Radiologist, Medication, MD;  Location: [place];  Service: Radiology;  Laterality: N/A;   RADIOLOGY WITH ANESTHESIA N/A 11/15/2020   Procedure: IR WITH ANESTHESIA;  Surgeon: Suzette Battiest, MD;  Location: [place];  Service: Radiology;  Laterality: N/A;   TIPS PROCEDURE N/A 11/22/2019   Procedure: BRTO/TRANS-JUGULAR INTRAHEPATIC PORTAL SHUNT (TIPS);  Surgeon: Corrie Mckusick, DO;  Location: [place];  Service: Anesthesiology;  Laterality: N/A;   WISDOM TOOTH EXTRACTION     Social History   Tobacco Use   Smoking status: Every Day    Packs/day: 1.00    Years: 30.00    Total pack years: 30.00    Types: Cigarettes    Start date: 12/24/1979   Smokeless tobacco: Never  Vaping Use   Vaping Use: Never used  Substance Use Topics   Alcohol use: Yes    Comment: last alcohol Dec 25 2021   Drug use: No   Family History  Problem Relation Age of Onset   Cancer Mother        ? etiology   Heart disease Mother    Colon polyps Sister 68       two sisters   Brain cancer Father    Liver disease Neg Hx    No Known Allergies  Review of Systems  Constitutional:  Positive for malaise/fatigue.  All other systems reviewed and are negative.    Objective:     BP 112/68   Pulse (!) 114   Ht 6' (1.829  m)   Wt 230 lb 9.6 oz (104.6 kg)   SpO2 100%   BMI 31.27 kg/m  BP Readings from Last 3 Encounters:  10/18/22 112/68  10/17/22 113/74  10/15/22 119/70   Physical Exam Vitals reviewed.  Constitutional:      General: He is not in acute distress.    Appearance: Normal appearance. He is not ill-appearing.  HENT:     Head: Normocephalic and atraumatic.     Nose: Nose normal. No congestion or rhinorrhea.     Mouth/Throat:     Mouth: Mucous membranes are moist.     Pharynx: Oropharynx is clear.  Eyes:     General: No scleral icterus.    Extraocular Movements: Extraocular movements intact.     Conjunctiva/sclera: Conjunctivae normal.     Pupils: Pupils are equal, round, and reactive to light.  Cardiovascular:     Rate and Rhythm: Normal rate and regular rhythm.  Pulses: Normal pulses.     Heart sounds: Normal heart sounds. No murmur heard. Pulmonary:     Effort: Pulmonary effort is normal.     Breath sounds: Normal breath sounds. No wheezing, rhonchi or rales.  Abdominal:     General: Bowel sounds are normal. There is no distension.     Palpations: Abdomen is soft.     Tenderness: There is no abdominal tenderness.  Musculoskeletal:        General: No swelling or deformity. Normal range of motion.     Cervical back: Normal range of motion.  Skin:    General: Skin is warm and dry.     Capillary Refill: Capillary refill takes less than 2 seconds.     Coloration: Skin is not jaundiced.  Neurological:     General: No focal deficit present.     Mental Status: He is alert and oriented to person, place, and time.     Motor: No weakness.     Comments: No asterixis appreciated on exam  Psychiatric:        Mood and Affect: Mood normal.        Behavior: Behavior normal.        Thought Content: Thought content normal.    Last CBC Lab Results  Component Value Date   WBC 2.3 (L) 10/15/2022   HGB 7.8 (L) 10/15/2022   HCT 22.9 (L) 10/15/2022   MCV 107.0 (H) 10/15/2022   MCH  36.4 (H) 10/15/2022   RDW 14.8 10/15/2022   PLT 87 (L) 93/81/0175   Last metabolic panel Lab Results  Component Value Date   GLUCOSE 160 (H) 10/15/2022   NA 137 10/15/2022   K 3.9 10/15/2022   CL 115 (H) 10/15/2022   CO2 14 (L) 10/15/2022   BUN 39 (H) 10/15/2022   CREATININE 2.85 (H) 10/15/2022   GFRNONAA 26 (L) 10/15/2022   CALCIUM 8.7 (L) 10/15/2022   PHOS 2.4 (L) 11/19/2020   PROT 5.7 (L) 10/14/2022   ALBUMIN 3.6 10/14/2022   LABGLOB 2.9 10/09/2022   AGRATIO 1.3 10/09/2022   BILITOT 2.0 (H) 10/14/2022   ALKPHOS 117 10/14/2022   AST 37 10/14/2022   ALT 19 10/14/2022   ANIONGAP 8 10/15/2022   Last lipids Lab Results  Component Value Date   CHOL 216 (H) 10/24/2020   HDL 70 10/24/2020   LDLCALC 121 (H) 10/24/2020   TRIG 91 11/18/2020   CHOLHDL 3.1 10/24/2020   Last hemoglobin A1c Lab Results  Component Value Date   HGBA1C 6.2 (H) 09/25/2022   Last thyroid functions Lab Results  Component Value Date   TSH 9.034 (H) 06/01/2018   Last vitamin B12 and Folate Lab Results  Component Value Date   VITAMINB12 555 08/17/2022   FOLATE 28.9 08/17/2022   The 10-year ASCVD risk score (Arnett DK, et al., 2019) is: 11.1%    Assessment & Plan:   Problem List Items Addressed This Visit       Alcoholic cirrhosis of liver with ascites ([place]) - Primary    Currently undergoing transplant work-up and evaluation at [facility].  He has a series of appointment scheduled for next week. -Repeat MELD labs ordered today      Intractable nausea and vomiting    Chronic issue, previously attributed to portal hypertensive gastropathy.  At his request, I have prescribed Compazine 5 mg x 10 tablets for refractory nausea/vomiting.  I have cautioned him on judicious use given his poor hepatic function.  Acute renal failure superimposed on stage 3b chronic kidney disease, unspecified acute renal failure type [facility])    Recent hospital admission 11/1 - 11/6 for AKI on CKD 3B.  Creatinine 4.37  at the time of admission, baseline 2.3-2.8.  Prerenal etiology suspected in the setting of poor oral intake with intractable nausea and vomiting.  He was gently diuresed and received albumin during admission.  Creatinine improved to 2.85 on the day of discharge. -Repeat labs ordered today -Plan for follow-up in 1 month       Return in about 4 weeks (around 11/15/2022).    [name], MD

## 2022-10-18 NOTE — Patient Instructions (Signed)
It was a pleasure to see you today.  Thank you for giving [place] the opportunity to be involved in your care.  Below is a brief recap of your visit and next steps.  We will plan to see you again in December.  Summary Labs ordered today I have prescribed compazine x 10 tablets for as needed use for nausea Follow up 12/7

## 2022-10-18 NOTE — Assessment & Plan Note (Signed)
Currently undergoing transplant work-up and evaluation at [facility].  He has a series of appointment scheduled for next week. -Repeat MELD labs ordered today

## 2022-10-18 NOTE — Assessment & Plan Note (Addendum)
Chronic issue, previously attributed to portal hypertensive gastropathy.  At his request, I have prescribed Compazine 5 mg x 10 tablets for refractory nausea/vomiting.  I have cautioned him on judicious use given his poor hepatic function.

## 2022-10-19 ENCOUNTER — Other Ambulatory Visit: Payer: Self-pay | Admitting: Internal Medicine

## 2022-10-19 DIAGNOSIS — R112 Nausea with vomiting, unspecified: Secondary | ICD-10-CM

## 2022-10-19 LAB — CBC WITH DIFFERENTIAL/PLATELET
Basophils Absolute: 0 10*3/uL (ref 0.0–0.2)
Basos: 0 %
EOS (ABSOLUTE): 0.3 10*3/uL (ref 0.0–0.4)
Eos: 6 %
Hematocrit: 24.3 % — ABNORMAL LOW (ref 37.5–51.0)
Hemoglobin: 8.6 g/dL — ABNORMAL LOW (ref 13.0–17.7)
Immature Grans (Abs): 0 10*3/uL (ref 0.0–0.1)
Immature Granulocytes: 0 %
Lymphocytes Absolute: 0.4 10*3/uL — ABNORMAL LOW (ref 0.7–3.1)
Lymphs: 8 %
MCH: 36.4 pg — ABNORMAL HIGH (ref 26.6–33.0)
MCHC: 35.4 g/dL (ref 31.5–35.7)
MCV: 103 fL — ABNORMAL HIGH (ref 79–97)
Monocytes Absolute: 0.9 10*3/uL (ref 0.1–0.9)
Monocytes: 17 %
Neutrophils Absolute: 3.4 10*3/uL (ref 1.4–7.0)
Neutrophils: 69 %
Platelets: 115 10*3/uL — ABNORMAL LOW (ref 150–450)
RBC: 2.36 x10E6/uL — CL (ref 4.14–5.80)
RDW: 13.8 % (ref 11.6–15.4)
WBC: 4.9 10*3/uL (ref 3.4–10.8)

## 2022-10-19 LAB — CYTOLOGY - NON PAP

## 2022-10-19 LAB — CMP14+EGFR
ALT: 16 IU/L (ref 0–44)
AST: 33 IU/L (ref 0–40)
Albumin/Globulin Ratio: 2.1 (ref 1.2–2.2)
Albumin: 4.1 g/dL (ref 3.8–4.9)
Alkaline Phosphatase: 170 IU/L — ABNORMAL HIGH (ref 44–121)
BUN/Creatinine Ratio: 11 (ref 9–20)
BUN: 34 mg/dL — ABNORMAL HIGH (ref 6–24)
Bilirubin Total: 1.3 mg/dL — ABNORMAL HIGH (ref 0.0–1.2)
CO2: 15 mmol/L — ABNORMAL LOW (ref 20–29)
Calcium: 9.4 mg/dL (ref 8.7–10.2)
Chloride: 105 mmol/L (ref 96–106)
Creatinine, Ser: 3.21 mg/dL — ABNORMAL HIGH (ref 0.76–1.27)
Globulin, Total: 2 g/dL (ref 1.5–4.5)
Glucose: 134 mg/dL — ABNORMAL HIGH (ref 70–99)
Potassium: 4.2 mmol/L (ref 3.5–5.2)
Sodium: 135 mmol/L (ref 134–144)
Total Protein: 6.1 g/dL (ref 6.0–8.5)
eGFR: 22 mL/min/{1.73_m2} — ABNORMAL LOW (ref >59)

## 2022-10-19 LAB — PROTIME-INR
INR: 1.1 (ref 0.9–1.2)
Prothrombin Time: 11.9 s (ref 9.1–12.0)

## 2022-10-22 LAB — CULTURE, BODY FLUID W GRAM STAIN -BOTTLE
Culture: NO GROWTH
Special Requests: ADEQUATE

## 2022-10-23 ENCOUNTER — Encounter: Payer: Self-pay | Admitting: Internal Medicine

## 2022-10-25 ENCOUNTER — Other Ambulatory Visit: Payer: Self-pay | Admitting: Gastroenterology

## 2022-10-25 DIAGNOSIS — K7682 Hepatic encephalopathy: Secondary | ICD-10-CM

## 2022-10-25 DIAGNOSIS — K7031 Alcoholic cirrhosis of liver with ascites: Secondary | ICD-10-CM

## 2022-10-31 ENCOUNTER — Ambulatory Visit (HOSPITAL_COMMUNITY): Payer: Managed Care, Other (non HMO)

## 2022-11-02 ENCOUNTER — Inpatient Hospital Stay (HOSPITAL_COMMUNITY): Admission: RE | Admit: 2022-11-02 | Payer: Managed Care, Other (non HMO) | Source: Ambulatory Visit

## 2022-11-06 ENCOUNTER — Other Ambulatory Visit (HOSPITAL_COMMUNITY): Payer: Managed Care, Other (non HMO)

## 2022-11-15 ENCOUNTER — Ambulatory Visit: Payer: Managed Care, Other (non HMO) | Admitting: Internal Medicine

## 2022-11-16 ENCOUNTER — Telehealth: Payer: Managed Care, Other (non HMO)

## 2022-12-17 ENCOUNTER — Inpatient Hospital Stay (HOSPITAL_COMMUNITY)
Admission: EM | Admit: 2022-12-17 | Discharge: 2022-12-18 | DRG: 441 | Disposition: A | Discharge status: Hospice | Payer: Managed Care, Other (non HMO) | Attending: Family Medicine | Admitting: Family Medicine

## 2022-12-17 ENCOUNTER — Other Ambulatory Visit: Payer: Self-pay

## 2022-12-17 ENCOUNTER — Encounter (HOSPITAL_COMMUNITY): Payer: Self-pay | Admitting: Emergency Medicine

## 2022-12-17 DIAGNOSIS — I120 Hypertensive chronic kidney disease with stage 5 chronic kidney disease or end stage renal disease: Secondary | ICD-10-CM | POA: Diagnosis present

## 2022-12-17 DIAGNOSIS — Z781 Physical restraint status: Secondary | ICD-10-CM

## 2022-12-17 DIAGNOSIS — Z8249 Family history of ischemic heart disease and other diseases of the circulatory system: Secondary | ICD-10-CM | POA: Diagnosis not present

## 2022-12-17 DIAGNOSIS — K7210 Chronic hepatic failure without coma: Secondary | ICD-10-CM | POA: Diagnosis present

## 2022-12-17 DIAGNOSIS — I8500 Esophageal varices without bleeding: Secondary | ICD-10-CM | POA: Diagnosis present

## 2022-12-17 DIAGNOSIS — J449 Chronic obstructive pulmonary disease, unspecified: Secondary | ICD-10-CM | POA: Diagnosis present

## 2022-12-17 DIAGNOSIS — K3189 Other diseases of stomach and duodenum: Secondary | ICD-10-CM | POA: Diagnosis present

## 2022-12-17 DIAGNOSIS — K7682 Hepatic encephalopathy: Principal | ICD-10-CM | POA: Diagnosis present

## 2022-12-17 DIAGNOSIS — Z66 Do not resuscitate: Secondary | ICD-10-CM | POA: Diagnosis not present

## 2022-12-17 DIAGNOSIS — F32A Depression, unspecified: Secondary | ICD-10-CM

## 2022-12-17 DIAGNOSIS — K2100 Gastro-esophageal reflux disease with esophagitis, without bleeding: Secondary | ICD-10-CM | POA: Diagnosis present

## 2022-12-17 DIAGNOSIS — K7031 Alcoholic cirrhosis of liver with ascites: Secondary | ICD-10-CM | POA: Diagnosis present

## 2022-12-17 DIAGNOSIS — C3490 Malignant neoplasm of unspecified part of unspecified bronchus or lung: Secondary | ICD-10-CM | POA: Diagnosis present

## 2022-12-17 DIAGNOSIS — K921 Melena: Secondary | ICD-10-CM | POA: Diagnosis present

## 2022-12-17 DIAGNOSIS — E119 Type 2 diabetes mellitus without complications: Secondary | ICD-10-CM

## 2022-12-17 DIAGNOSIS — R140 Abdominal distension (gaseous): Secondary | ICD-10-CM | POA: Diagnosis present

## 2022-12-17 DIAGNOSIS — Z83719 Family history of colon polyps, unspecified: Secondary | ICD-10-CM

## 2022-12-17 DIAGNOSIS — K766 Portal hypertension: Secondary | ICD-10-CM | POA: Diagnosis present

## 2022-12-17 DIAGNOSIS — N179 Acute kidney failure, unspecified: Secondary | ICD-10-CM | POA: Diagnosis present

## 2022-12-17 DIAGNOSIS — Z515 Encounter for palliative care: Secondary | ICD-10-CM

## 2022-12-17 DIAGNOSIS — F1020 Alcohol dependence, uncomplicated: Secondary | ICD-10-CM | POA: Diagnosis present

## 2022-12-17 DIAGNOSIS — T473X6A Underdosing of saline and osmotic laxatives, initial encounter: Secondary | ICD-10-CM | POA: Diagnosis present

## 2022-12-17 DIAGNOSIS — I864 Gastric varices: Secondary | ICD-10-CM | POA: Diagnosis present

## 2022-12-17 DIAGNOSIS — D696 Thrombocytopenia, unspecified: Secondary | ICD-10-CM

## 2022-12-17 DIAGNOSIS — E1122 Type 2 diabetes mellitus with diabetic chronic kidney disease: Secondary | ICD-10-CM | POA: Diagnosis present

## 2022-12-17 DIAGNOSIS — D61818 Other pancytopenia: Secondary | ICD-10-CM | POA: Diagnosis present

## 2022-12-17 DIAGNOSIS — R1310 Dysphagia, unspecified: Secondary | ICD-10-CM | POA: Diagnosis present

## 2022-12-17 DIAGNOSIS — F17210 Nicotine dependence, cigarettes, uncomplicated: Secondary | ICD-10-CM | POA: Diagnosis present

## 2022-12-17 DIAGNOSIS — I8510 Secondary esophageal varices without bleeding: Secondary | ICD-10-CM | POA: Diagnosis present

## 2022-12-17 DIAGNOSIS — N186 End stage renal disease: Secondary | ICD-10-CM | POA: Diagnosis present

## 2022-12-17 DIAGNOSIS — K760 Fatty (change of) liver, not elsewhere classified: Secondary | ICD-10-CM | POA: Diagnosis present

## 2022-12-17 DIAGNOSIS — R7401 Elevation of levels of liver transaminase levels: Secondary | ICD-10-CM | POA: Diagnosis present

## 2022-12-17 DIAGNOSIS — F17200 Nicotine dependence, unspecified, uncomplicated: Secondary | ICD-10-CM | POA: Diagnosis present

## 2022-12-17 DIAGNOSIS — F1029 Alcohol dependence with unspecified alcohol-induced disorder: Secondary | ICD-10-CM

## 2022-12-17 DIAGNOSIS — Z992 Dependence on renal dialysis: Secondary | ICD-10-CM | POA: Diagnosis not present

## 2022-12-17 DIAGNOSIS — K219 Gastro-esophageal reflux disease without esophagitis: Secondary | ICD-10-CM | POA: Diagnosis present

## 2022-12-17 DIAGNOSIS — Z79899 Other long term (current) drug therapy: Secondary | ICD-10-CM

## 2022-12-17 DIAGNOSIS — I10 Essential (primary) hypertension: Secondary | ICD-10-CM | POA: Diagnosis present

## 2022-12-17 DIAGNOSIS — R7989 Other specified abnormal findings of blood chemistry: Secondary | ICD-10-CM | POA: Diagnosis present

## 2022-12-17 DIAGNOSIS — G621 Alcoholic polyneuropathy: Secondary | ICD-10-CM | POA: Diagnosis present

## 2022-12-17 DIAGNOSIS — R7303 Prediabetes: Secondary | ICD-10-CM | POA: Diagnosis present

## 2022-12-17 DIAGNOSIS — Z91138 Patient's unintentional underdosing of medication regimen for other reason: Secondary | ICD-10-CM

## 2022-12-17 DIAGNOSIS — R4182 Altered mental status, unspecified: Principal | ICD-10-CM

## 2022-12-17 LAB — COMPREHENSIVE METABOLIC PANEL
ALT: 12 U/L (ref 0–44)
AST: 34 U/L (ref 15–41)
Albumin: 3.6 g/dL (ref 3.5–5.0)
Alkaline Phosphatase: 79 U/L (ref 38–126)
Anion gap: 14 (ref 5–15)
BUN: 29 mg/dL — ABNORMAL HIGH (ref 6–20)
CO2: 22 mmol/L (ref 22–32)
Calcium: 9.2 mg/dL (ref 8.9–10.3)
Chloride: 98 mmol/L (ref 98–111)
Creatinine, Ser: 5.99 mg/dL — ABNORMAL HIGH (ref 0.61–1.24)
GFR, Estimated: 10 mL/min — ABNORMAL LOW (ref >60)
Glucose, Bld: 95 mg/dL (ref 70–99)
Potassium: 3.5 mmol/L (ref 3.5–5.1)
Sodium: 134 mmol/L — ABNORMAL LOW (ref 135–145)
Total Bilirubin: 2.5 mg/dL — ABNORMAL HIGH (ref 0.3–1.2)
Total Protein: 5.5 g/dL — ABNORMAL LOW (ref 6.5–8.1)

## 2022-12-17 LAB — CBC WITH DIFFERENTIAL/PLATELET
Abs Immature Granulocytes: 0.01 10*3/uL (ref 0.00–0.07)
Basophils Absolute: 0 10*3/uL (ref 0.0–0.1)
Basophils Relative: 1 %
Eosinophils Absolute: 0.1 10*3/uL (ref 0.0–0.5)
Eosinophils Relative: 4 %
HCT: 25.9 % — ABNORMAL LOW (ref 39.0–52.0)
Hemoglobin: 8.6 g/dL — ABNORMAL LOW (ref 13.0–17.0)
Immature Granulocytes: 1 %
Lymphocytes Relative: 13 %
Lymphs Abs: 0.2 10*3/uL — ABNORMAL LOW (ref 0.7–4.0)
MCH: 33.7 pg (ref 26.0–34.0)
MCHC: 33.2 g/dL (ref 30.0–36.0)
MCV: 101.6 fL — ABNORMAL HIGH (ref 80.0–100.0)
Monocytes Absolute: 0.4 10*3/uL (ref 0.1–1.0)
Monocytes Relative: 25 %
Neutro Abs: 1 10*3/uL — ABNORMAL LOW (ref 1.7–7.7)
Neutrophils Relative %: 56 %
Platelets: 52 10*3/uL — ABNORMAL LOW (ref 150–400)
RBC: 2.55 MIL/uL — ABNORMAL LOW (ref 4.22–5.81)
RDW: 19.2 % — ABNORMAL HIGH (ref 11.5–15.5)
WBC: 1.8 10*3/uL — ABNORMAL LOW (ref 4.0–10.5)
nRBC: 0 % (ref 0.0–0.2)

## 2022-12-17 LAB — LACTIC ACID, PLASMA: Lactic Acid, Venous: 2.3 mmol/L (ref 0.5–1.9)

## 2022-12-17 LAB — BLOOD GAS, VENOUS
Acid-Base Excess: 4 mmol/L — ABNORMAL HIGH (ref 0.0–2.0)
Bicarbonate: 26.9 mmol/L (ref 20.0–28.0)
Drawn by: 66297
FIO2: 21 %
O2 Saturation: 90 %
Patient temperature: 36.5
pCO2, Ven: 32 mmHg — ABNORMAL LOW (ref 44–60)
pH, Ven: 7.53 — ABNORMAL HIGH (ref 7.25–7.43)
pO2, Ven: 51 mmHg — ABNORMAL HIGH (ref 32–45)

## 2022-12-17 LAB — AMMONIA: Ammonia: 76 umol/L — ABNORMAL HIGH (ref 9–35)

## 2022-12-17 MED ORDER — HALOPERIDOL LACTATE 5 MG/ML IJ SOLN
5.0000 mg | Freq: Once | INTRAMUSCULAR | Status: AC
  Administered 2022-12-17: 5 mg via INTRAMUSCULAR
  Filled 2022-12-17: qty 1

## 2022-12-17 MED ORDER — ONDANSETRON HCL 4 MG/2ML IJ SOLN: 4.0000 mg | Freq: Four times a day (QID) | INTRAMUSCULAR | Status: DC | PRN

## 2022-12-17 MED ORDER — PAROXETINE HCL 20 MG PO TABS
20.0000 mg | ORAL_TABLET | Freq: Every day | ORAL | Status: DC
  Filled 2022-12-17: qty 1

## 2022-12-17 MED ORDER — NICOTINE 14 MG/24HR TD PT24
14.0000 mg | MEDICATED_PATCH | Freq: Every day | TRANSDERMAL | Status: DC
  Administered 2022-12-17: 14 mg via TRANSDERMAL
  Filled 2022-12-17: qty 1

## 2022-12-17 MED ORDER — ONDANSETRON HCL 4 MG PO TABS: 4.0000 mg | ORAL_TABLET | Freq: Four times a day (QID) | ORAL | Status: DC | PRN

## 2022-12-17 MED ORDER — PANTOPRAZOLE SODIUM 40 MG IV SOLR
40.0000 mg | INTRAVENOUS | Status: DC
  Administered 2022-12-17: 40 mg via INTRAVENOUS
  Filled 2022-12-17: qty 10

## 2022-12-17 MED ORDER — RIFAXIMIN 550 MG PO TABS
550.0000 mg | ORAL_TABLET | Freq: Two times a day (BID) | ORAL | Status: DC
  Filled 2022-12-17: qty 1

## 2022-12-17 MED ORDER — LACTULOSE 10 GM/15ML PO SOLN: 30.0000 g | Freq: Once | ORAL | Status: DC

## 2022-12-17 MED ORDER — LORAZEPAM 2 MG/ML IJ SOLN
1.0000 mg | Freq: Once | INTRAMUSCULAR | Status: AC
  Administered 2022-12-17: 1 mg via INTRAMUSCULAR
  Filled 2022-12-17: qty 1

## 2022-12-17 MED ORDER — LACTULOSE ENEMA
300.0000 mL | Freq: Once | ORAL | Status: AC
  Administered 2022-12-17: 300 mL via RECTAL
  Filled 2022-12-17: qty 300

## 2022-12-17 MED ORDER — LACTULOSE ENEMA
300.0000 mL | Freq: Two times a day (BID) | ORAL | Status: DC
  Administered 2022-12-17: 300 mL via RECTAL
  Filled 2022-12-17 (×4): qty 300

## 2022-12-17 MED ORDER — MIDODRINE HCL 5 MG PO TABS
15.0000 mg | ORAL_TABLET | Freq: Three times a day (TID) | ORAL | Status: DC
  Filled 2022-12-17: qty 3

## 2022-12-17 NOTE — Consult Note (Signed)
Gastroenterology Consult   Referring Provider: Dr. Teressa Lower, [name]  Primary Care Physician:  [name], MD Primary Gastroenterologist:  Dr. Gala Romney   Patient ID: Bradley Miles; [id number]; [birth year]   Admit date: 12/17/2022  LOS: 0 days   Date of Consultation: 12/17/2022  Reason for Consultation:  Encephalopathy   History of Present Illness   Bradley Miles is a 57 y.o. year old male with a history of ETOH cirrhosis complicated by esophageal varices, ascites, hepatic encephalopathy, TIPS placement with subsequent occlusion and unsuccessful attempt at recannulization by IR, diabetes, HTN, COPD, not a transplant candidate by [facility] due to thrombosed TIPS and recently seen in [place] for multivisceral transplantation but not a candidate due to reported concerning lung lesions. Multiple prior hospitalizations and on dialysis.   Presenting to the ED today due to worsening encephalopathy in setting of missed lactulose dosing due to airline travel from [place] back home. Sister at bedside, stating he got behind on dosing on Saturday. Restless last night. Unable to awake this morning. Sister does state after he was found to not be a transplant candidate, that he had a few beers Saturday and yesterday.   Unable to obtain meaningful information from patient. He is intermittently combative. Restraints in place. Sister at bedside. No overt GI bleeding. Received lactulose enema at 1350.    EGD on 11/27 at [facility] with large varices incompletely eradicated, banded, type 1 gastroesophageal varices, portal gastropathy,      Last Hgb 9.3 in Dec 2023. Hgb 8.6 today without overt GI bleeding.     Past Medical History:  Diagnosis Date   Alcoholic cirrhosis ([place])    Anxiety    Controlled type 2 diabetes mellitus with complication, without long-term current use of insulin ([place]) 06/26/2020   Enlarged liver    GERD (gastroesophageal reflux disease)    Hypertension     Palpitations    PVC's (premature ventricular contractions)    Shortness of breath    Tussive syncope 12/22/2019   Varicose veins     Past Surgical History:  Procedure Laterality Date   BIOPSY  02/02/2019   Procedure: BIOPSY;  Surgeon: Daneil Dolin, MD;  [facility];  Service: Endoscopy;;  gastric   COLONOSCOPY WITH ESOPHAGOGASTRODUODENOSCOPY (EGD)  12/16/2012   internal hemorrhoids, colonic diverticulosis, benign polyps, screening in 2024. EGD with mild erosive reflux esophagitis, small hiatal hernia, negative H.pylori   COLONOSCOPY WITH PROPOFOL N/A 06/22/2021   Procedure: COLONOSCOPY WITH PROPOFOL;  Surgeon: Daneil Dolin, MD;  [facility];  Service: Endoscopy;  Laterality: N/A;  1:45pm   cyst reomved     from spine   ESOPHAGOGASTRODUODENOSCOPY  05/19/09   RMR: Geographic distal esophageal erosions consistent with severe erosive reflux esophagitis. schatzki's ring s/P dilation/small hiatal hernia otherwise normal stomach   ESOPHAGOGASTRODUODENOSCOPY (EGD) WITH PROPOFOL N/A 02/02/2019   Dr. Gala Romney: retained gastric contents. portal HTN gastropathy   ESOPHAGOGASTRODUODENOSCOPY (EGD) WITH PROPOFOL N/A 11/20/2019   Procedure: ESOPHAGOGASTRODUODENOSCOPY (EGD) WITH PROPOFOL;  Surgeon: Daneil Dolin, MD;  [facility];  Service: Endoscopy;  Laterality: N/A;   ESOPHAGOGASTRODUODENOSCOPY (EGD) WITH PROPOFOL N/A 11/24/2019   Procedure: ESOPHAGOGASTRODUODENOSCOPY (EGD) WITH PROPOFOL;  Surgeon: Ronnette Juniper, MD;  Location: [facility];  Service: Gastroenterology;  Laterality: N/A;   ESOPHAGOGASTRODUODENOSCOPY (EGD) WITH PROPOFOL N/A 08/08/2020   Procedure: ESOPHAGOGASTRODUODENOSCOPY (EGD) WITH PROPOFOL;  Surgeon: Daneil Dolin, MD;  [facility];  Service: Endoscopy;  Laterality: N/A;   ESOPHAGOGASTRODUODENOSCOPY (EGD) WITH PROPOFOL N/A 11/15/2020   Procedure: ESOPHAGOGASTRODUODENOSCOPY (EGD) WITH PROPOFOL;  Surgeon: Ladene Artist, MD;  Location[facility]  ENDOSCOPY;  Service: Endoscopy;  Laterality: N/A;   ESOPHAGOGASTRODUODENOSCOPY (EGD) WITH PROPOFOL N/A 08/19/2022   Procedure: ESOPHAGOGASTRODUODENOSCOPY (EGD) WITH PROPOFOL;  Surgeon: Harvel Quale, MD;  [facility];  Service: Gastroenterology;  Laterality: N/A;   IR ANGIOGRAM SELECTIVE EACH ADDITIONAL VESSEL  11/22/2019   IR ANGIOGRAM SELECTIVE EACH ADDITIONAL VESSEL  11/24/2019   IR ANGIOGRAM SELECTIVE EACH ADDITIONAL VESSEL  11/24/2019   IR ANGIOGRAM SELECTIVE EACH ADDITIONAL VESSEL  11/24/2019   IR ANGIOGRAM SELECTIVE EACH ADDITIONAL VESSEL  11/15/2020   IR EMBO ART  VEN HEMORR LYMPH EXTRAV  INC GUIDE ROADMAPPING  11/22/2019   IR EMBO ART  VEN HEMORR LYMPH EXTRAV  INC GUIDE ROADMAPPING  11/24/2019   IR EMBO ART  VEN HEMORR LYMPH EXTRAV  INC GUIDE ROADMAPPING  11/15/2020   IR FLUORO GUIDE CV LINE RIGHT  11/15/2020   IR RADIOLOGIST EVAL & MGMT  12/29/2019   IR RADIOLOGIST EVAL & MGMT  10/27/2020   IR RADIOLOGIST EVAL & MGMT  04/19/2021   IR TIPS  11/24/2019   IR US GUIDE VASC ACCESS RIGHT  11/22/2019   IR US GUIDE VASC ACCESS RIGHT  11/15/2020   IR US GUIDE VASC ACCESS RIGHT  11/15/2020   IR VENOGRAM RENAL UNI LEFT  11/22/2019   IR VENOGRAM RENAL UNI LEFT  11/15/2020   POLYPECTOMY  06/22/2021   Procedure: POLYPECTOMY;  Surgeon: Daneil Dolin, MD;  [facility];  Service: Endoscopy;;   RADIOLOGY WITH ANESTHESIA N/A 11/24/2019   Procedure: IR WITH ANESTHESIA;  Surgeon: Radiologist, Medication, MD;  Location: [facility];  Service: Radiology;  Laterality: N/A;   RADIOLOGY WITH ANESTHESIA N/A 11/15/2020   Procedure: IR WITH ANESTHESIA;  Surgeon: Suzette Battiest, MD;  Location: [facility];  Service: Radiology;  Laterality: N/A;   TIPS PROCEDURE N/A 11/22/2019   Procedure: BRTO/TRANS-JUGULAR INTRAHEPATIC PORTAL SHUNT (TIPS);  Surgeon: Corrie Mckusick, DO;  Location: [facility];  Service: Anesthesiology;  Laterality: N/A;   WISDOM TOOTH EXTRACTION      Prior to Admission  medications   Medication Sig Start Date End Date Taking? Authorizing Provider  allopurinol (ZYLOPRIM) 100 MG tablet Take 1 tablet (100 mg total) by mouth daily. Patient taking differently: Take 100 mg by mouth 3 (three) times a week. Tues, thurs, sat 09/11/22  Yes [name], MD  lactulose (CHRONULAC) 10 GM/15ML solution TAKE 45MLS BY MOUTH EVERY 6 HOURS(TITRATE AS NEEDED TO HAVE 2-4 BOWEL MOVEMENTS DAILY) Patient taking differently: Take 20 g by mouth 3 (three) times daily. 10/25/22  Yes Mahon, Courtney L, NP  midodrine (PROAMATINE) 5 MG tablet Take 15 mg by mouth 3 (three) times daily. 12/14/22  Yes [provider]  Multiple Vitamin (MULTIVITAMIN ADULT PO) Take 1 tablet by mouth daily.   Yes [provider]  pantoprazole (PROTONIX) 40 MG tablet Take 40 mg by mouth daily.   Yes [provider]  PARoxetine (PAXIL) 40 MG tablet Take 1 tablet (40 mg total) by mouth daily. Patient taking differently: Take 20 mg by mouth daily. 10/04/22  Yes [name], MD  XIFAXAN 550 MG TABS tablet TAKE 1 TABLET(550 MG) BY MOUTH TWICE DAILY Patient taking differently: Take 550 mg by mouth 2 (two) times daily. 07/20/22  Yes Mahala Menghini, PA-C  sodium bicarbonate 650 MG tablet Take 2  tablets (1,300 mg total) by mouth 2 (two) times daily. Patient not taking: Reported on 12/17/2022 09/29/22   Orson Eva, MD    Current Facility-Administered Medications  Medication Dose Route Frequency Provider Last Rate Last Admin   lactulose (CHRONULAC) 10 GM/15ML solution 30 g  30 g Oral Once Kommor, Madison, MD       Current Outpatient Medications  Medication Sig Dispense Refill   allopurinol (ZYLOPRIM) 100 MG tablet Take 1 tablet (100 mg total) by mouth daily. (Patient taking differently: Take 100 mg by mouth 3 (three) times a week. Tues, thurs, sat) 90 tablet 1   lactulose (CHRONULAC) 10 GM/15ML solution TAKE 45MLS BY MOUTH EVERY 6 HOURS(TITRATE AS NEEDED TO HAVE 2-4 BOWEL MOVEMENTS DAILY)  (Patient taking differently: Take 20 g by mouth 3 (three) times daily.) 946 mL 8   midodrine (PROAMATINE) 5 MG tablet Take 15 mg by mouth 3 (three) times daily.     Multiple Vitamin (MULTIVITAMIN ADULT PO) Take 1 tablet by mouth daily.     pantoprazole (PROTONIX) 40 MG tablet Take 40 mg by mouth daily.     PARoxetine (PAXIL) 40 MG tablet Take 1 tablet (40 mg total) by mouth daily. (Patient taking differently: Take 20 mg by mouth daily.) 90 tablet 1   XIFAXAN 550 MG TABS tablet TAKE 1 TABLET(550 MG) BY MOUTH TWICE DAILY (Patient taking differently: Take 550 mg by mouth 2 (two) times daily.) 60 tablet 11   sodium bicarbonate 650 MG tablet Take 2 tablets (1,300 mg total) by mouth 2 (two) times daily. (Patient not taking: Reported on 12/17/2022) 120 tablet 1    Allergies as of 12/17/2022   (No Known Allergies)    Family History  Problem Relation Age of Onset   Cancer Mother        ? etiology   Heart disease Mother    Colon polyps Sister 58       two sisters   Brain cancer Father    Liver disease Neg Hx     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, academic    Comment: Lawyer  Tobacco Use   Smoking status: Every Day    Packs/day: 1.00    Years: 30.00    Total pack years: 30.00    Types: Cigarettes    Start date: 12/24/1979   Smokeless tobacco: Never  Vaping Use   Vaping Use: Never used  Substance and Sexual Activity   Alcohol use: Yes    Comment: last alcohol Dec 25 2021   Drug use: No   Sexual activity: Yes    Partners: Female    Birth control/protection: None  Other Topics Concern   Not on file  Social History Narrative   Lives at home with family.   Right-handed.   No daily use of caffeine.   Social Determinants of Health   Financial Resource Strain: Not on  file  Food Insecurity: No Food Insecurity (10/10/2022)   Hunger Vital Sign    Worried About Running Out of Food in the Last Year: Never true    Ran Out of Food in the Last Year: Never true  Transportation Needs: No Transportation Needs (10/10/2022)   PRAPARE - Hydrologist (Medical): No  Lack of Transportation (Non-Medical): No  Physical Activity: Not on file  Stress: Not on file  Social Connections: Not on file  Intimate Partner Violence: Not At Risk (10/10/2022)   Humiliation, Afraid, Rape, and Kick questionnaire    Fear of Current or Ex-Partner: No    Emotionally Abused: No    Physically Abused: No    Sexually Abused: No     Review of Systems   Unable to obtain due to mental status.   Physical Exam   Vital Signs in last 24 hours: Temp:  [97.4 F (36.3 C)-97.8 F (36.6 C)] 97.8 F (36.6 C) (01/08 1352) Pulse Rate:  [81-106] 98 (01/08 1352) Resp:  [12-20] 12 (01/08 1352) BP: (100-117)/(69-77) 109/77 (01/08 1235) SpO2:  [96 %-100 %] 100 % (01/08 1352) Weight:  [104.3 kg] 104.3 kg (01/08 1003)    General:   Alert,  agitated, intermittently falling asleep but easily awakens. Anxious. Left wrist in restraints.  Head:  Normocephalic and atraumatic. Eyes:  Sclera clear, no icterus.   Ears:  Normal auditory acuity. Lungs:  Clear throughout to auscultation.    Heart:  S1 S2 present without murmurs  Abdomen:  Soft, nontender and nondistended. Limited exam as patient laying on side and will not lay supine  Rectal: deferred   Msk:  Symmetrical without gross deformities. Normal posture. Extremities:  Without  edema. Neurologic:  unable to assess  Skin:  Intact without significant lesions or rashes. Psych:  agitated   Intake/Output from previous day: No intake/output data recorded. Intake/Output this shift: No intake/output data recorded.   Labs/Studies   Recent Labs Recent Labs    12/17/22 1130  WBC 1.8*  HGB 8.6*  HCT 25.9*  PLT 52*    BMET Recent Labs    12/17/22 1130  NA 134*  K 3.5  CL 98  CO2 22  GLUCOSE 95  BUN 29*  CREATININE 5.99*  CALCIUM 9.2   LFT Recent Labs    12/17/22 1130  PROT 5.5*  ALBUMIN 3.6  AST 34  ALT 12  ALKPHOS 21  BILITOT 2.5*      Assessment   Bradley Miles is a 57 y.o. year old male with a history of ETOH cirrhosis complicated by esophageal varices, ascites, hepatic encephalopathy, TIPS placement with subsequent occlusion and unsuccessful attempt at recannulization by IR, diabetes, HTN, COPD, not a transplant candidate by [facility] or [facility] as noted above, multiple prior hospitalizations and on dialysis, presenting with worsening hepatic encephalopathy.  Hepatic encephalopathy: in setting of missed lactulose dosing inadvertently while traveling back from [place]. Sister also reports he resumed alcohol intake on Saturday once found he was not a transplant candidate, drinking a few beers. Received lactulose enema. Would avoid oral meds until he is able to protect his airway. Continue with enemas for now.   Cirrhosis: end-stage disease. Unfortunately, he has been evaluated by two separate transplant centers and felt not to be a candidate at either ([facility] due to thrombosed TIPS not amenable to recannulization) and [facility] due to concerning lung lesions. I do not have access to that imaging currently. Will need further outpatient follow-up with Pulmonary.    Plan / Recommendations    NPO until safe to take in oral meds by mouth Continue lactulose enemas Resume Xifaxan when able to tolerate oral PPI IV daily Nephrology consultation pending for dialysis Outpatient evaluation for lung lesions Check INR to calculate MELD Will continue to follow with you      12/17/2022, 3:32 PM  Annitta Needs, PhD, ANP-BC [facility]

## 2022-12-17 NOTE — Hospital Course (Addendum)
57 year old gentleman with end-stage liver disease, alcoholic liver cirrhosis, alcohol and tobacco abuse, chronic recurrent bouts of hepatic encephalopathy, status post 2 failed TIPS procedures, new findings of lung nodules concerning for cancer, recently declined for liver kidney transplant had 2 major transplant centers, end-stage renal disease on hemodialysis, last treatment given 12/14/22 at transplant center in [place], chronic severe thrombocytopenia, pancytopenia.  He was just discharged from a transplant center in [place], [place] and unfortunately due to delays with airline travel and being on the airplane he has not been able to take his lactulose like normal over the last couple of days.  Since arriving back home to [place] he has had increasing confusion, agitation, combativeness concerning for acute hepatic encephalopathy which he has had many times in the past.    He arrived to ED by EMS with confused, not following commands, lethargic and not at his baseline.  Sister insists he is a full code.  He was noted to have an elevated ammonia at 76.  He was so combative in the ED he had to be sedated with haloperidol so that they could insert a rectal tube to provide lactulose treatment.  GI and nephrology was consulted.  Hospitalist consulted to admit.

## 2022-12-17 NOTE — H&P (Signed)
History and Physical  [facility]  BRIGHTEN ORNDOFF QQP:[id number] DOB: 11-15-1966 DOA: 12/17/2022  PCP: [name], MD  Patient coming from: Home  Level of care: Med-Surg  I have personally briefly reviewed patient's old medical records in [place]  Chief Complaint: altered mental state   HPI: Bradley Miles is a 57 year old gentleman with end-stage liver disease, alcoholic liver cirrhosis, alcohol and tobacco abuse, chronic recurrent bouts of hepatic encephalopathy, status post 2 failed TIPS procedures, new findings of lung nodules concerning for cancer, recently declined for liver kidney transplant had 2 major transplant centers, end-stage renal disease on hemodialysis, last treatment given 12/14/22 at transplant center in [place], chronic severe thrombocytopenia, pancytopenia.  He was just discharged from a transplant center in [place], [place] and unfortunately due to delays with airline travel and being on the airplane he has not been able to take his lactulose like normal over the last couple of days.  Since arriving back home to [place] he has had increasing confusion, agitation, combativeness concerning for acute hepatic encephalopathy which he has had many times in the past.    He arrived to ED by EMS with confused, not following commands, lethargic and not at his baseline.  Sister insists he is a full code.  He was noted to have an elevated ammonia at 76.  He was so combative in the ED he had to be sedated with haloperidol so that they could insert a rectal tube to provide lactulose treatment.  GI and nephrology was consulted.  Hospitalist consulted to admit.     Past Medical History:  Diagnosis Date   Alcoholic cirrhosis ([place])    Anxiety    Controlled type 2 diabetes mellitus with complication, without long-term current use of insulin ([place]) 06/26/2020   Enlarged liver    GERD (gastroesophageal reflux disease)    Hypertension    Palpitations    PVC's (premature ventricular  contractions)    Shortness of breath    Tussive syncope 12/22/2019   Varicose veins     Past Surgical History:  Procedure Laterality Date   BIOPSY  02/02/2019   Procedure: BIOPSY;  Surgeon: Daneil Dolin, MD;  [facility];  Service: Endoscopy;;  gastric   COLONOSCOPY WITH ESOPHAGOGASTRODUODENOSCOPY (EGD)  12/16/2012   internal hemorrhoids, colonic diverticulosis, benign polyps, screening in 2024. EGD with mild erosive reflux esophagitis, small hiatal hernia, negative H.pylori   COLONOSCOPY WITH PROPOFOL N/A 06/22/2021   Procedure: COLONOSCOPY WITH PROPOFOL;  Surgeon: Daneil Dolin, MD;  [facility];  Service: Endoscopy;  Laterality: N/A;  1:45pm   cyst reomved     from spine   ESOPHAGOGASTRODUODENOSCOPY  05/19/09   RMR: Geographic distal esophageal erosions consistent with severe erosive reflux esophagitis. schatzki's ring s/P dilation/small hiatal hernia otherwise normal stomach   ESOPHAGOGASTRODUODENOSCOPY (EGD) WITH PROPOFOL N/A 02/02/2019   Dr. Gala Romney: retained gastric contents. portal HTN gastropathy   ESOPHAGOGASTRODUODENOSCOPY (EGD) WITH PROPOFOL N/A 11/20/2019   Procedure: ESOPHAGOGASTRODUODENOSCOPY (EGD) WITH PROPOFOL;  Surgeon: Daneil Dolin, MD;  [facility];  Service: Endoscopy;  Laterality: N/A;   ESOPHAGOGASTRODUODENOSCOPY (EGD) WITH PROPOFOL N/A 11/24/2019   Procedure: ESOPHAGOGASTRODUODENOSCOPY (EGD) WITH PROPOFOL;  Surgeon: Ronnette Juniper, MD;  Location: [place];  Service: Gastroenterology;  Laterality: N/A;   ESOPHAGOGASTRODUODENOSCOPY (EGD) WITH PROPOFOL N/A 08/08/2020   Procedure: ESOPHAGOGASTRODUODENOSCOPY (EGD) WITH PROPOFOL;  Surgeon: Daneil Dolin, MD;  [facility];  Service: Endoscopy;  Laterality: N/A;   ESOPHAGOGASTRODUODENOSCOPY (EGD)  WITH PROPOFOL N/A 11/15/2020   Procedure: ESOPHAGOGASTRODUODENOSCOPY (EGD) WITH PROPOFOL;  Surgeon: Ladene Artist, MD;  [facility] END[facility]COPY;  Service: Endoscopy;  Laterality:  N/A;   ESOPHAGOGASTRODUODENOSCOPY (EGD) WITH PROPOFOL N/A 08/19/2022   Procedure: ESOPHAGOGASTRODUODENOSCOPY (EGD) WITH PROPOFOL;  Surgeon: Harvel Quale, MD;  [facility];  Service: Gastroenterology;  Laterality: N/A;   IR ANGIOGRAM SELECTIVE EACH ADDITIONAL VESSEL  11/22/2019   IR ANGIOGRAM SELECTIVE EACH ADDITIONAL VESSEL  11/24/2019   IR ANGIOGRAM SELECTIVE EACH ADDITIONAL VESSEL  11/24/2019   IR ANGIOGRAM SELECTIVE EACH ADDITIONAL VESSEL  11/24/2019   IR ANGIOGRAM SELECTIVE EACH ADDITIONAL VESSEL  11/15/2020   IR EMBO ART  VEN HEMORR LYMPH EXTRAV  INC GUIDE ROADMAPPING  11/22/2019   IR EMBO ART  VEN HEMORR LYMPH EXTRAV  INC GUIDE ROADMAPPING  11/24/2019   IR EMBO ART  VEN HEMORR LYMPH EXTRAV  INC GUIDE ROADMAPPING  11/15/2020   IR FLUORO GUIDE CV LINE RIGHT  11/15/2020   IR RADIOLOGIST EVAL & MGMT  12/29/2019   IR RADIOLOGIST EVAL & MGMT  10/27/2020   IR RADIOLOGIST EVAL & MGMT  04/19/2021   IR TIPS  11/24/2019   IR US GUIDE VASC ACCESS RIGHT  11/22/2019   IR US GUIDE VASC ACCESS RIGHT  11/15/2020   IR US GUIDE VASC ACCESS RIGHT  11/15/2020   IR VENOGRAM RENAL UNI LEFT  11/22/2019   IR VENOGRAM RENAL UNI LEFT  11/15/2020   POLYPECTOMY  06/22/2021   Procedure: POLYPECTOMY;  Surgeon: Daneil Dolin, MD;  [facility];  Service: Endoscopy;;   RADIOLOGY WITH ANESTHESIA N/A 11/24/2019   Procedure: IR WITH ANESTHESIA;  Surgeon: Radiologist, Medication, MD;  Location: [place];  Service: Radiology;  Laterality: N/A;   RADIOLOGY WITH ANESTHESIA N/A 11/15/2020   Procedure: IR WITH ANESTHESIA;  Surgeon: Suzette Battiest, MD;  Location: [place];  Service: Radiology;  Laterality: N/A;   TIPS PROCEDURE N/A 11/22/2019   Procedure: BRTO/TRANS-JUGULAR INTRAHEPATIC PORTAL SHUNT (TIPS);  Surgeon: Corrie Mckusick, DO;  Location: [place];  Service: Anesthesiology;  Laterality: N/A;   WISDOM TOOTH EXTRACTION       reports that he has been smoking cigarettes. He started smoking about 43  years ago. He has a 30.00 pack-year smoking history. He has never used smokeless tobacco. He reports current alcohol use. He reports that he does not use drugs.  No Known Allergies  Family History  Problem Relation Age of Onset   Cancer Mother        ? etiology   Heart disease Mother    Colon polyps Sister 36       two sisters   Brain cancer Father    Liver disease Neg Hx     Prior to Admission medications   Medication Sig Start Date End Date Taking? Authorizing Provider  allopurinol (ZYLOPRIM) 100 MG tablet Take 1 tablet (100 mg total) by mouth daily. Patient taking differently: Take 100 mg by mouth 3 (three) times a week. Tues, thurs, sat 09/11/22  Yes [name], MD  lactulose (CHRONULAC) 10 GM/15ML solution TAKE 45MLS BY MOUTH EVERY 6 HOURS(TITRATE AS NEEDED TO HAVE 2-4 BOWEL MOVEMENTS DAILY) Patient taking differently: Take 20 g by mouth 3 (three) times daily. 10/25/22  Yes Mahon, Courtney L, NP  midodrine (PROAMATINE) 5 MG tablet Take 15 mg by mouth 3 (three) times daily. 12/14/22  Yes [provider]  Multiple Vitamin (MULTIVITAMIN ADULT PO) Take 1 tablet by mouth daily.  Yes [provider]  pantoprazole (PROTONIX) 40 MG tablet Take 40 mg by mouth daily.   Yes [provider]  PARoxetine (PAXIL) 40 MG tablet Take 1 tablet (40 mg total) by mouth daily. Patient taking differently: Take 20 mg by mouth daily. 10/04/22  Yes [name], MD  XIFAXAN 550 MG TABS tablet TAKE 1 TABLET(550 MG) BY MOUTH TWICE DAILY Patient taking differently: Take 550 mg by mouth 2 (two) times daily. 07/20/22  Yes Mahala Menghini, PA-C    Physical Exam: Vitals:   12/17/22 1200 12/17/22 1235 12/17/22 1352 12/17/22 1500  BP: 117/73 109/77  114/75  Pulse: 81 (!) 106 98 88  Resp: 14 14 12 12   Temp:   97.8 F (36.6 C)   TempSrc:   Axillary   SpO2: 100% 100% 100% 100%  Weight:      Height:       Constitutional: obtunded Eyes: PERRL, lids and conjunctivae  normal ENMT: Mucous membranes are moist. Posterior pharynx clear of any exudate or lesions.  Neck: normal, supple, no masses, no thyromegaly Respiratory: clear to auscultation bilaterally, no wheezing, no crackles. Normal respiratory effort. No accessory muscle use.  Cardiovascular: normal s1, s2 sounds, no murmurs / rubs / gallops. No extremity edema. 2+ pedal pulses. No carotid bruits.  Abdomen: no masses palpated. No hepatosplenomegaly. Bowel sounds positive.  Musculoskeletal: no clubbing / cyanosis. No joint deformity upper and lower extremities. Good ROM, no contractures. Normal muscle tone.  Skin: no rashes, lesions, ulcers. No induration Neurologic: spontaneously moving all extremities. Sensation intact, DTR normal. Strength 5/5 in all 4.  Psychiatric: obtunded.   Labs on Admission: I have personally reviewed following labs and imaging studies  CBC: Recent Labs  Lab 12/17/22 1130  WBC 1.8*  NEUTROABS 1.0*  HGB 8.6*  HCT 25.9*  MCV 101.6*  PLT 52*   Basic Metabolic Panel: Recent Labs  Lab 12/17/22 1130  NA 134*  K 3.5  CL 98  CO2 22  GLUCOSE 95  BUN 29*  CREATININE 5.99*  CALCIUM 9.2   GFR: Estimated Creatinine Clearance: 17.6 mL/min (A) (by C-G formula based on SCr of 5.99 mg/dL (H)). Liver Function Tests: Recent Labs  Lab 12/17/22 1130  AST 34  ALT 12  ALKPHOS 79  BILITOT 2.5*  PROT 5.5*  ALBUMIN 3.6   No results for input(s): "LIPASE", "AMYLASE" in the last 168 hours. Recent Labs  Lab 12/17/22 1130  AMMONIA 76*   Coagulation Profile: No results for input(s): "INR", "PROTIME" in the last 168 hours. Cardiac Enzymes: No results for input(s): "CKTOTAL", "CKMB", "CKMBINDEX", "TROPONINI" in the last 168 hours. BNP (last 3 results) No results for input(s): "PROBNP" in the last 8760 hours. HbA1C: No results for input(s): "HGBA1C" in the last 72 hours. CBG: No results for input(s): "GLUCAP" in the last 168 hours. Lipid Profile: No results for  input(s): "CHOL", "HDL", "LDLCALC", "TRIG", "CHOLHDL", "LDLDIRECT" in the last 72 hours. Thyroid Function Tests: No results for input(s): "TSH", "T4TOTAL", "FREET4", "T3FREE", "THYROIDAB" in the last 72 hours. Anemia Panel: No results for input(s): "VITAMINB12", "FOLATE", "FERRITIN", "TIBC", "IRON", "RETICCTPCT" in the last 72 hours. Urine analysis:    Component Value Date/Time   COLORURINE YELLOW 09/26/2022 1810   APPEARANCEUR CLEAR 09/26/2022 1810   LABSPEC 1.012 09/26/2022 1810   PHURINE 6.0 09/26/2022 1810   GLUCOSEU NEGATIVE 09/26/2022 1810   HGBUR NEGATIVE 09/26/2022 1810   BILIRUBINUR NEGATIVE 09/26/2022 1810   KETONESUR NEGATIVE 09/26/2022 1810   PROTEINUR NEGATIVE 09/26/2022  [place] 09/26/2022 [place] 09/26/2022 1810   Radiological Exams on Admission: No results found.  EKG: Independently reviewed.   Assessment/Plan Principal Problem:   Hepatic encephalopathy (HCC) Active Problems:   TOBACCO ABUSE   GERD   Dysphagia   Hypertension   Hematochezia   EtOH dependence (HCC)   Elevated transaminase measurement   Prediabetes   COPD, mild (HCC)   Alcoholic cirrhosis of liver with ascites (HCC)   Alcoholic peripheral neuropathy (HCC)   Increased ammonia level   Hepatic steatosis   Portal hypertension (HCC)   Abnormal LFTs   Esophageal varices (HCC)   Thrombocytopenia (HCC)   Controlled type 2 diabetes mellitus without complication, without long-term current use of insulin (HCC)    Acute hepatic encephalopathy -precipitated by changes in lactulose administration due to recent travel (missed doses) -rectal lactulose is the only safe way to administer lactulose at this time due to his condition -rectal tube inserted in ED -continue rectal lactulose BID ordered by GI -when able to take oral again plan to resume rifaximin -supportive measures -neuro checks overnight  -MELD labs per GI team -unfortunately patient declined for  liver/kidney transplant at 2 transplant centers -recheck ammonia in AM   Chronic thrombocytopenia -secondary to end stage liver cirrhosis -no active bleeding at this time -cbc ordered for tomorrow morning  COPD -stable, no active symptoms of exacerbation   Ongoing tobacco dependence -asking for a cigarette in his encephalopathic state -nicotine patch ordered   ESRD on HD  -last HD was given in [place] on 12/14/22 -nephrology consulted and planning HD on 12/18/22  Type 2 diabetes mellitus  -diet controlled -monitor CBG for goal BS 140-180 while inpatient   DVT prophylaxis: SCDs  Code Status: Full   Family Communication: sister at bedside, wife traveling by car to [facility]  Disposition Plan: anticipating home when stabilized to baseline   Consults called: GI, nephrology   Admission status: INP  Level of care: Med-Surg Irwin Brakeman MD [facility] How to contact the [facility] Attending or Consulting provider [place] or covering provider during after hours 7P -7A, for this patient?  Check the care team in [facility] and look for a) attending/consulting [facility] provider listed and b) the [facility] team listed Log into [email or web address] and use [place]'s universal password to access. If you do not have the password, please contact the hospital operator. Locate the [facility] provider you are looking for under [facility] and page to a number that you can be directly reached. If you still have difficulty reaching the provider, please page the [facility] (Director on Call) for the Hospitalists listed on amion for assistance.   If 7PM-7AM, please contact night-coverage [email or web address] Password [facility]  12/17/2022, 4:22 PM

## 2022-12-17 NOTE — ED Notes (Signed)
Patient still not allowing staff to get lab work, place pt on his back or put patient on monitor. EDP aware. Sister at bedside attempting to calm patient.

## 2022-12-17 NOTE — ED Notes (Signed)
Patient refusing to turn onto back to get on monitor. Patient yelling "fuck. Stop!" Anytime staff attempts to turn patient. Sister and EDP at bedside.

## 2022-12-17 NOTE — ED Provider Notes (Signed)
[facility] EMERGENCY DEPARTMENT Provider Note  CSN: [id number] Arrival date & time: 12/17/22 7867  Chief Complaint(s) Altered Mental Status  HPI Bradley Miles is a 57 y.o. 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  Patient recently was diagnosed with possible metastatic lesions in the lungs on Friday and over the last 72 hours has progressively worsened and is altered mental status.  He has required multiple hospitalizations for rectal lactulose and hepatic encephalopathy.  Today in the emergency department, patient is altered and will not answer questions.  He will intermittently respond "what" but remains prone on the bed, becoming very agitated when healthcare staff attempted move the patient.   Past Medical History Past Medical History:  Diagnosis Date   Alcoholic cirrhosis ([place])    Anxiety    Controlled type 2 diabetes mellitus with complication, without long-term current use of insulin ([place]) 06/26/2020   Enlarged liver    GERD (gastroesophageal reflux disease)    Hypertension    Palpitations    PVC's (premature ventricular contractions)    Shortness of breath    Tussive syncope 12/22/2019   Varicose veins    Patient Active Problem List   Diagnosis Date Noted   AKI (acute kidney injury) ([place]) 67/20/9470   Metabolic acidosis 96/28/3662   Acute renal failure superimposed on stage 3b chronic kidney disease, unspecified acute renal failure type ([place]) 09/26/2022   Intractable nausea and vomiting 09/26/2022   Hypokalemia 09/26/2022   Need for shingles vaccine 09/20/2022   Need for influenza vaccination 09/20/2022   Hepatic encephalopathy ([place]) 08/16/2022   Acute kidney injury ([place]) 08/16/2022   Elevated AFP  10/02/2021   S/P alcohol detoxification 08/24/2021   Depression 08/24/2021   Hematemesis 11/15/2020   GIB (gastrointestinal bleeding) 08/05/2020   Acute GI bleeding 08/05/2020   Gastric varices    Thrombocytopenia ([place]) 06/26/2020   Controlled type 2 diabetes mellitus without complication, without long-term current use of insulin ([place]) 06/26/2020   Tussive syncope 12/22/2019   Acute blood loss anemia    Alcohol dependence with inpatient treatment ([place])    Esophageal varices ([place])    Hematemesis/vomiting blood 11/20/2019   RUQ pain 07/30/2019   Abnormal LFTs 07/30/2019   Bilateral leg edema 06/03/2019   Portal hypertension ([place]) 11/26/2018   Hepatic steatosis 06/27/2018   Abdominal pain    Alcoholic hepatitis without ascites    Acute hepatic encephalopathy ([place]) 06/01/2018   Acute respiratory failure with hypoxia ([place]) 94/76/5465   Alcoholic cirrhosis of liver with ascites ([place]) 06/01/2018   COPD (chronic obstructive pulmonary disease) ([place]) 03/54/6568   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   Increased ammonia level    COPD, mild ([place]) 05/14/2017   Right leg claudication ([place]) 05/14/2017   Prediabetes 02/04/2017   Chest pain 12/21/2015   Right leg DVT ([place]) 07/18/2015   Elevated transaminase measurement 07/18/2015   Sensory neuropathy 07/16/2015   Anxiety 12/23/2014   EtOH dependence ([place]) 02/09/2014   Headache in back of head 01/12/2014   Superficial thrombosis of lower extremity 07/01/2013   Hematochezia 11/17/2012   Mixed hyperlipidemia 02/15/2012   Hypertension 01/18/2012   Insomnia 01/18/2012   Family history of colonic polyps 11/15/2011   Palpitations 10/04/2011   TOBACCO ABUSE 05/18/2010   GERD 05/18/2010   CONSTIPATION 05/18/2010   ERECTILE  DYSFUNCTION, ORGANIC 05/18/2010   Dysphagia 05/18/2010   ABDOMINAL PAIN, RIGHT LOWER QUADRANT 05/18/2010   Home Medication(s) Prior to Admission medications   Medication Sig Start Date End Date Taking?  Authorizing Provider  allopurinol (ZYLOPRIM) 100 MG tablet Take 1 tablet (100 mg total) by mouth daily. Patient taking differently: Take 100 mg by mouth 3 (three) times a week. Tues, thurs, sat 09/11/22  Yes [name], MD  lactulose (CHRONULAC) 10 GM/15ML solution TAKE 45MLS BY MOUTH EVERY 6 HOURS(TITRATE AS NEEDED TO HAVE 2-4 BOWEL MOVEMENTS DAILY) Patient taking differently: Take 20 g by mouth 3 (three) times daily. 10/25/22  Yes Mahon, Courtney L, NP  midodrine (PROAMATINE) 5 MG tablet Take 15 mg by mouth 3 (three) times daily. 12/14/22  Yes [provider]  Multiple Vitamin (MULTIVITAMIN ADULT PO) Take 1 tablet by mouth daily.   Yes [provider]  pantoprazole (PROTONIX) 40 MG tablet Take 40 mg by mouth daily.   Yes [provider]  PARoxetine (PAXIL) 40 MG tablet Take 1 tablet (40 mg total) by mouth daily. Patient taking differently: Take 20 mg by mouth daily. 10/04/22  Yes [name], MD  XIFAXAN 550 MG TABS tablet TAKE 1 TABLET(550 MG) BY MOUTH TWICE DAILY Patient taking differently: Take 550 mg by mouth 2 (two) times daily. 07/20/22  Yes Mahala Menghini, PA-C  sodium bicarbonate 650 MG tablet Take 2 tablets (1,300 mg total) by mouth 2 (two) times daily. Patient not taking: Reported on 12/17/2022 09/29/22   Orson Eva, MD                                                                                                                                    Past Surgical History Past Surgical History:  Procedure Laterality Date   BIOPSY  02/02/2019   Procedure: BIOPSY;  Surgeon: Daneil Dolin, MD;  [facility];  Service: Endoscopy;;  gastric   COLONOSCOPY WITH ESOPHAGOGASTRODUODENOSCOPY (EGD)  12/16/2012   internal hemorrhoids, colonic diverticulosis, benign polyps, screening in 2024. EGD with mild erosive reflux esophagitis, small hiatal hernia, negative H.pylori   COLONOSCOPY WITH PROPOFOL N/A 06/22/2021   Procedure: COLONOSCOPY WITH PROPOFOL;   Surgeon: Daneil Dolin, MD;  [facility];  Service: Endoscopy;  Laterality: N/A;  1:45pm   cyst reomved     from spine   ESOPHAGOGASTRODUODENOSCOPY  05/19/09   RMR: Geographic distal esophageal erosions consistent with severe erosive reflux esophagitis. schatzki's ring s/P dilation/small hiatal hernia otherwise normal stomach   ESOPHAGOGASTRODUODENOSCOPY (EGD) WITH PROPOFOL N/A 02/02/2019   Dr. Gala Romney: retained gastric contents. portal HTN gastropathy   ESOPHAGOGASTRODUODENOSCOPY (EGD) WITH PROPOFOL N/A 11/20/2019   Procedure: ESOPHAGOGASTRODUODENOSCOPY (EGD) WITH PROPOFOL;  Surgeon: Daneil Dolin, MD;  [facility];  Service: Endoscopy;  Laterality: N/A;   ESOPHAGOGASTRODUODENOSCOPY (EGD) WITH PROPOFOL N/A 11/24/2019   Procedure: ESOPHAGOGASTRODUODENOSCOPY (EGD) WITH PROPOFOL;  Surgeon: Ronnette Juniper, MD;  Location: [facility] END[facility];  Service: Gastroenterology;  Laterality: N/A;   ESOPHAGOGASTRODUODENOSCOPY (EGD) WITH PROPOFOL N/A 08/08/2020   Procedure: ESOPHAGOGASTRODUODENOSCOPY (EGD) WITH PROPOFOL;  Surgeon: Daneil Dolin, MD;  [facility];  Service: Endoscopy;  Laterality: N/A;   ESOPHAGOGASTRODUODENOSCOPY (EGD) WITH PROPOFOL N/A 11/15/2020   Procedure: ESOPHAGOGASTRODUODENOSCOPY (EGD) WITH PROPOFOL;  Surgeon: Ladene Artist, MD;  [facility] ENDOSCOPY;  Service: Endoscopy;  Laterality: N/A;   ESOPHAGOGASTRODUODENOSCOPY (EGD) WITH PROPOFOL N/A 08/19/2022   Procedure: ESOPHAGOGASTRODUODENOSCOPY (EGD) WITH PROPOFOL;  Surgeon: Harvel Quale, MD;  [facility];  Service: Gastroenterology;  Laterality: N/A;   IR ANGIOGRAM SELECTIVE EACH ADDITIONAL VESSEL  11/22/2019   IR ANGIOGRAM SELECTIVE EACH ADDITIONAL VESSEL  11/24/2019   IR ANGIOGRAM SELECTIVE EACH ADDITIONAL VESSEL  11/24/2019   IR ANGIOGRAM SELECTIVE EACH ADDITIONAL VESSEL  11/24/2019   IR ANGIOGRAM SELECTIVE EACH ADDITIONAL VESSEL  11/15/2020   IR EMBO ART  VEN HEMORR LYMPH EXTRAV   INC GUIDE ROADMAPPING  11/22/2019   IR EMBO ART  VEN HEMORR LYMPH EXTRAV  INC GUIDE ROADMAPPING  11/24/2019   IR EMBO ART  VEN HEMORR LYMPH EXTRAV  INC GUIDE ROADMAPPING  11/15/2020   IR FLUORO GUIDE CV LINE RIGHT  11/15/2020   IR RADIOLOGIST EVAL & MGMT  12/29/2019   IR RADIOLOGIST EVAL & MGMT  10/27/2020   IR RADIOLOGIST EVAL & MGMT  04/19/2021   IR TIPS  11/24/2019   IR US GUIDE VASC ACCESS RIGHT  11/22/2019   IR US GUIDE VASC ACCESS RIGHT  11/15/2020   IR US GUIDE VASC ACCESS RIGHT  11/15/2020   IR VENOGRAM RENAL UNI LEFT  11/22/2019   IR VENOGRAM RENAL UNI LEFT  11/15/2020   POLYPECTOMY  06/22/2021   Procedure: POLYPECTOMY;  Surgeon: Daneil Dolin, MD;  [facility];  Service: Endoscopy;;   RADIOLOGY WITH ANESTHESIA N/A 11/24/2019   Procedure: IR WITH ANESTHESIA;  Surgeon: Radiologist, Medication, MD;  Location: [place];  Service: Radiology;  Laterality: N/A;   RADIOLOGY WITH ANESTHESIA N/A 11/15/2020   Procedure: IR WITH ANESTHESIA;  Surgeon: Suzette Battiest, MD;  Location: [place];  Service: Radiology;  Laterality: N/A;   TIPS PROCEDURE N/A 11/22/2019   Procedure: BRTO/TRANS-JUGULAR INTRAHEPATIC PORTAL SHUNT (TIPS);  Surgeon: Corrie Mckusick, DO;  Location: [place];  Service: Anesthesiology;  Laterality: N/A;   WISDOM TOOTH EXTRACTION     Family History Family History  Problem Relation Age of Onset   Cancer Mother        ? etiology   Heart disease Mother    Colon polyps Sister 90       two sisters   Brain cancer Father    Liver disease Neg Hx     Social History Social History   Tobacco Use   Smoking status: Every Day    Packs/day: 1.00    Years: 30.00    Total pack years: 30.00    Types: Cigarettes    Start date: 12/24/1979   Smokeless tobacco: Never  Vaping Use   Vaping Use: Never used  Substance Use Topics   Alcohol use: Yes    Comment: last alcohol Dec 25 2021   Drug use: No   Allergies Patient has no known allergies.  Review of Systems Review of Systems   Unable to perform ROS: Mental status change    Physical Exam Vital Signs  I have reviewed the triage vital signs BP 114/75   Pulse 88   Temp 97.8 F (  36.6 C) (Axillary)   Resp 12   Ht 6' (1.829 m)   Wt 104.3 kg   SpO2 100%   BMI 31.19 kg/m   Physical Exam Vitals and nursing note reviewed.  Constitutional:      General: He is not in acute distress.    Appearance: He is well-developed. He is ill-appearing.  HENT:     Head: Normocephalic and atraumatic.  Eyes:     General: Scleral icterus present.  Cardiovascular:     Rate and Rhythm: Normal rate and regular rhythm.     Heart sounds: No murmur heard. Pulmonary:     Effort: Pulmonary effort is normal. No respiratory distress.     Breath sounds: Normal breath sounds.  Abdominal:     General: There is distension.     Palpations: Abdomen is soft.     Tenderness: There is no abdominal tenderness.  Musculoskeletal:        General: No swelling.     Cervical back: Neck supple.  Skin:    General: Skin is warm and dry.     Capillary Refill: Capillary refill takes less than 2 seconds.  Neurological:     Mental Status: He is alert. He is disoriented.  Psychiatric:        Mood and Affect: Mood normal.     ED Results and Treatments Labs (all labs ordered are listed, but only abnormal results are displayed) Labs Reviewed  COMPREHENSIVE METABOLIC PANEL - Abnormal; Notable for the following components:      Result Value   Sodium 134 (*)    BUN 29 (*)    Creatinine, Ser 5.99 (*)    Total Protein 5.5 (*)    Total Bilirubin 2.5 (*)    GFR, Estimated 10 (*)    All other components within normal limits  CBC WITH DIFFERENTIAL/PLATELET - Abnormal; Notable for the following components:   WBC 1.8 (*)    RBC 2.55 (*)    Hemoglobin 8.6 (*)    HCT 25.9 (*)    MCV 101.6 (*)    RDW 19.2 (*)    Platelets 52 (*)    Neutro Abs 1.0 (*)    Lymphs Abs 0.2 (*)    All other components within normal limits  AMMONIA - Abnormal; Notable  for the following components:   Ammonia 76 (*)    All other components within normal limits  BLOOD GAS, VENOUS - Abnormal; Notable for the following components:   pH, Ven 7.53 (*)    pCO2, Ven 32 (*)    pO2, Ven 51 (*)    Acid-Base Excess 4.0 (*)    All other components within normal limits  LACTIC ACID, PLASMA - Abnormal; Notable for the following components:   Lactic Acid, Venous 2.3 (*)    All other components within normal limits  RAPID URINE DRUG SCREEN, HOSP PERFORMED  URINALYSIS, ROUTINE W REFLEX MICROSCOPIC  Radiology No results found.  Pertinent labs & imaging results that were available during my care of the patient were reviewed by me and considered in my medical decision making (see MDM for details).  Medications Ordered in ED Medications  lactulose (CHRONULAC) 10 GM/15ML solution 30 g (0 g Oral Hold 12/17/22 1352)  LORazepam (ATIVAN) injection 1 mg (1 mg Intramuscular Given 12/17/22 1014)  haloperidol lactate (HALDOL) injection 5 mg (5 mg Intramuscular Given 12/17/22 1049)  lactulose (CHRONULAC) enema 200 gm (300 mLs Rectal Given 12/17/22 1351)                                                                                                                                     Procedures Procedures  (including critical care time)  Medical Decision Making / ED Course   This patient presents to the ED for concern of altered mental status, this involves an extensive number of treatment options, and is a complaint that carries with it a high risk of complications and morbidity.  The differential diagnosis includes hepatic encephalopathy, sepsis, infection, metabolic/toxic encephalopathy, hypoglycemia, malperfusion, hypoxia, trauma or other intracranial process  MDM: Patient seen emergency room for evaluation of encephalopathy.  Initial evaluation was  exceedingly difficult given patient's altered mental status and refusal to allow hospital staff to obtain laboratory evaluation or examine the patient.  I spoke with the patient's sister at bedside and we as a team agreed that the patient will likely require some chemical restraint and intermittent physical restraint to able to obtain laboratory evaluation and examine the patient and thus patient received Haldol and Ativan.  Physical exam with abdominal distention, spider angiomata, scleral icterus and patient is disoriented.  Laboratory evaluation with pancytopenia with 1.8, hemoglobin 8.6, MCV 101.6, platelet count 52, pH 7.53, lactic acid 2.3 likely secondary to decreased hepatic clearance, BUN 29, creatinine 5.99 consistent with his history of ESRD.  Ammonia is elevated at 76.  I consulted gastroenterologist on-call Dr. Abbey Chatters who will come to see the patient at bedside.  Rectal tube was placed and lactulose enema given.  Patient then admitted for hepatic encephalopathy   Additional history obtained: -Additional history obtained from multiple family members -External records from outside source obtained and reviewed including: Chart review including previous notes, labs, imaging, consultation notes   Lab Tests: -I ordered, reviewed, and interpreted labs.   The pertinent results include:   Labs Reviewed  COMPREHENSIVE METABOLIC PANEL - Abnormal; Notable for the following components:      Result Value   Sodium 134 (*)    BUN 29 (*)    Creatinine, Ser 5.99 (*)    Total Protein 5.5 (*)    Total Bilirubin 2.5 (*)    GFR, Estimated 10 (*)    All other components within normal limits  CBC WITH DIFFERENTIAL/PLATELET - Abnormal; Notable for the following components:   WBC 1.8 (*)    RBC 2.55 (*)  Hemoglobin 8.6 (*)    HCT 25.9 (*)    MCV 101.6 (*)    RDW 19.2 (*)    Platelets 52 (*)    Neutro Abs 1.0 (*)    Lymphs Abs 0.2 (*)    All other components within normal limits  AMMONIA -  Abnormal; Notable for the following components:   Ammonia 76 (*)    All other components within normal limits  BLOOD GAS, VENOUS - Abnormal; Notable for the following components:   pH, Ven 7.53 (*)    pCO2, Ven 32 (*)    pO2, Ven 51 (*)    Acid-Base Excess 4.0 (*)    All other components within normal limits  LACTIC ACID, PLASMA - Abnormal; Notable for the following components:   Lactic Acid, Venous 2.3 (*)    All other components within normal limits  RAPID URINE DRUG SCREEN, HOSP PERFORMED  URINALYSIS, ROUTINE W REFLEX MICROSCOPIC   Medicines ordered and prescription drug management: Meds ordered this encounter  Medications   LORazepam (ATIVAN) injection 1 mg   haloperidol lactate (HALDOL) injection 5 mg   lactulose (CHRONULAC) 10 GM/15ML solution 30 g   lactulose (CHRONULAC) enema 200 gm    -I have reviewed the patients home medicines and have made adjustments as needed  Critical interventions none  Consultations Obtained: I requested consultation with the gastroenterologist on-call Dr. Abbey Chatters,  and discussed lab and imaging findings as well as pertinent plan - they recommend: Lactulose in hospital admission   Cardiac Monitoring: The patient was maintained on a cardiac monitor.  I personally viewed and interpreted the cardiac monitored which showed an underlying rhythm of: NSR  Social Determinants of Health:  Factors impacting patients care include: none   Reevaluation: After the interventions noted above, I reevaluated the patient and found that they have :stayed the same  Co morbidities that complicate the patient evaluation  Past Medical History:  Diagnosis Date   Alcoholic cirrhosis ([place])    Anxiety    Controlled type 2 diabetes mellitus with complication, without long-term current use of insulin ([place]) 06/26/2020   Enlarged liver    GERD (gastroesophageal reflux disease)    Hypertension    Palpitations    PVC's (premature ventricular contractions)     Shortness of breath    Tussive syncope 12/22/2019   Varicose veins       Dispostion: I considered admission for this patient, and due to hepatic encephalopathy patient require hospital admission     Final Clinical Impression(s) / ED Diagnoses Final diagnoses:  Altered mental status, unspecified altered mental status type     @PCDICTATION @    Teressa Lower, MD 12/17/22 1545

## 2022-12-17 NOTE — ED Triage Notes (Signed)
Per EMS pr coming from home with sister- states patient was acting his normal til about 3pm yesterday. Became lethargic and not following commands. Hx lung cancer, renal failure and cirrhosis. Was scheduled for dialysis today. Patient was supposed to have liver transplant last week however platelets were too low. Patient only wanting to lay on stomach on stretcher.

## 2022-12-18 ENCOUNTER — Telehealth: Payer: Self-pay | Admitting: Internal Medicine

## 2022-12-18 DIAGNOSIS — R7989 Other specified abnormal findings of blood chemistry: Secondary | ICD-10-CM

## 2022-12-18 DIAGNOSIS — K7682 Hepatic encephalopathy: Secondary | ICD-10-CM | POA: Diagnosis not present

## 2022-12-18 DIAGNOSIS — D696 Thrombocytopenia, unspecified: Secondary | ICD-10-CM | POA: Diagnosis not present

## 2022-12-18 DIAGNOSIS — R7401 Elevation of levels of liver transaminase levels: Secondary | ICD-10-CM | POA: Diagnosis not present

## 2022-12-18 LAB — COMPREHENSIVE METABOLIC PANEL
ALT: 14 U/L (ref 0–44)
AST: 35 U/L (ref 15–41)
Albumin: 3.8 g/dL (ref 3.5–5.0)
Alkaline Phosphatase: 90 U/L (ref 38–126)
Anion gap: 14 (ref 5–15)
BUN: 37 mg/dL — ABNORMAL HIGH (ref 6–20)
CO2: 23 mmol/L (ref 22–32)
Calcium: 9.5 mg/dL (ref 8.9–10.3)
Chloride: 101 mmol/L (ref 98–111)
Creatinine, Ser: 6.7 mg/dL — ABNORMAL HIGH (ref 0.61–1.24)
GFR, Estimated: 9 mL/min — ABNORMAL LOW (ref >60)
Glucose, Bld: 96 mg/dL (ref 70–99)
Potassium: 3.5 mmol/L (ref 3.5–5.1)
Sodium: 138 mmol/L (ref 135–145)
Total Bilirubin: 3.8 mg/dL — ABNORMAL HIGH (ref 0.3–1.2)
Total Protein: 5.8 g/dL — ABNORMAL LOW (ref 6.5–8.1)

## 2022-12-18 LAB — AMMONIA: Ammonia: 44 umol/L — ABNORMAL HIGH (ref 9–35)

## 2022-12-18 LAB — CBC
HCT: 26.9 % — ABNORMAL LOW (ref 39.0–52.0)
Hemoglobin: 8.8 g/dL — ABNORMAL LOW (ref 13.0–17.0)
MCH: 33.2 pg (ref 26.0–34.0)
MCHC: 32.7 g/dL (ref 30.0–36.0)
MCV: 101.5 fL — ABNORMAL HIGH (ref 80.0–100.0)
Platelets: 55 10*3/uL — ABNORMAL LOW (ref 150–400)
RBC: 2.65 MIL/uL — ABNORMAL LOW (ref 4.22–5.81)
RDW: 19.3 % — ABNORMAL HIGH (ref 11.5–15.5)
WBC: 1.5 10*3/uL — ABNORMAL LOW (ref 4.0–10.5)
nRBC: 0 % (ref 0.0–0.2)

## 2022-12-18 LAB — PROTIME-INR
INR: 1.5 — ABNORMAL HIGH (ref 0.8–1.2)
Prothrombin Time: 18 seconds — ABNORMAL HIGH (ref 11.4–15.2)

## 2022-12-18 LAB — MAGNESIUM: Magnesium: 2 mg/dL (ref 1.7–2.4)

## 2022-12-18 MED ORDER — LACTULOSE 10 GM/15ML PO SOLN: 20.0000 g | Freq: Three times a day (TID) | ORAL | Status: DC

## 2022-12-18 MED ORDER — PAROXETINE HCL 40 MG PO TABS: 20.0000 mg | ORAL_TABLET | Freq: Every day | ORAL | Status: AC

## 2022-12-18 MED ORDER — ALLOPURINOL 100 MG PO TABS: 100.0000 mg | ORAL_TABLET | ORAL | Status: AC

## 2022-12-18 MED ORDER — LACTULOSE 10 GM/15ML PO SOLN: 20.0000 g | Freq: Three times a day (TID) | ORAL | Status: AC

## 2022-12-18 NOTE — Progress Notes (Signed)
Wife noted to be giving food (an orange) to the patient despite the order for "nothing by mouth". Education provided to wife. Wife indicates she understands the order but will continue to feed due to end of life. Nurse to communicate with provider to determine if order change is warranted.

## 2022-12-18 NOTE — Discharge Summary (Signed)
Physician Discharge Summary  EPHRIAM Miles WPV:[id number] DOB: [birth year] DOA: 12/17/2022  PCP: [name], MD  Admit date: 12/17/2022 Discharge date: 12/18/2022  Disposition:  Home with  Hospice   Recommendations for Outpatient Follow-up:  Symptom management per hospice protocol   Home Health:  Hospice   Discharge Condition: Hospice   CODE STATUS: DNR  DIET: hospice    Brief Hospitalization Summary: Please see all hospital notes, images, labs for full details of the hospitalization. 57 year old gentleman with end-stage liver disease, alcoholic liver cirrhosis, alcohol and tobacco abuse, chronic recurrent bouts of hepatic encephalopathy, status post 2 failed TIPS procedures, new findings of lung nodules concerning for cancer, recently declined for liver kidney transplant had 2 major transplant centers, end-stage renal disease on hemodialysis, last treatment given 12/14/22 at transplant center in [place], chronic severe thrombocytopenia, pancytopenia.  He was just discharged from a transplant center in [place], [place] and unfortunately due to delays with airline travel and being on the airplane he has not been able to take his lactulose like normal over the last couple of days.  Since arriving back home to [place] he has had increasing confusion, agitation, combativeness concerning for acute hepatic encephalopathy which he has had many times in the past.    He arrived to ED by EMS with confused, not following commands, lethargic and not at his baseline.  Sister insists he is a full code.  He was noted to have an elevated ammonia at 76.  He was so combative in the ED he had to be sedated with haloperidol so that they could insert a rectal tube to provide lactulose treatment.  GI and nephrology was consulted.  Hospitalist consulted to admit.    Hospital Course  Pt was admitted for treatment of acute hepatic encephalopathy secondary to missed doses of lactulose while traveling from [place] after he had been  evaluated for liver kidney transplantation which was declined due to new findings of lung masses concerning for cancerous lesions.  He was treated with lactulose enemas and fortunately his ammonia levels improved and his mentation improved considerably to his baseline.  He wants to discharge home with hospice.  He discussed with nephrologist and after discussions decided that he wanted to go home with hospice and not to continue further dialysis treatments.  I have consulted TOC to make arrangements for home hospice and home DME equipment.  This has been arranged.  Patient is stable to discharge home with hospice.  Discharge Diagnoses:  Principal Problem:   Hepatic encephalopathy ([place]) Active Problems:   TOBACCO ABUSE   GERD   Dysphagia   Hypertension   Hematochezia   EtOH dependence (HCC)   Elevated transaminase measurement   Prediabetes   COPD, mild (HCC)   Alcoholic cirrhosis of liver with ascites (HCC)   Alcoholic peripheral neuropathy (HCC)   Increased ammonia level   Hepatic steatosis   Portal hypertension (HCC)   Abnormal LFTs   Esophageal varices (HCC)   Thrombocytopenia (HCC)   Controlled type 2 diabetes mellitus without complication, without long-term current use of insulin ([place])   Discharge Instructions:  Allergies as of 12/18/2022   No Known Allergies      Medication List     TAKE these medications    allopurinol 100 MG tablet Commonly known as: ZYLOPRIM Take 1 tablet (100 mg total) by mouth 3 (three) times a week. Tues, thurs, sat Start taking on: December 19, 2022   lactulose 10 GM/15ML solution Commonly known as: CHRONULAC Take 30  mLs (20 g total) by mouth 3 (three) times daily. What changed: See the new instructions.   midodrine 5 MG tablet Commonly known as: PROAMATINE Take 15 mg by mouth 3 (three) times daily.   MULTIVITAMIN ADULT PO Take 1 tablet by mouth daily.   pantoprazole 40 MG tablet Commonly known as: PROTONIX Take 40 mg by mouth  daily.   PARoxetine 40 MG tablet Commonly known as: PAXIL Take 0.5 tablets (20 mg total) by mouth daily.   Xifaxan 550 MG Tabs tablet Generic drug: rifaximin TAKE 1 TABLET(550 MG) BY MOUTH TWICE DAILY What changed: See the new instructions.               Durable Medical Equipment  (From admission, onward)           Start     Ordered   12/18/22 0904  For home use only DME Cane  Once        12/18/22 0903            No Known Allergies Allergies as of 12/18/2022   No Known Allergies      Medication List     TAKE these medications    allopurinol 100 MG tablet Commonly known as: ZYLOPRIM Take 1 tablet (100 mg total) by mouth 3 (three) times a week. Tues, thurs, sat Start taking on: December 19, 2022   lactulose 10 GM/15ML solution Commonly known as: CHRONULAC Take 30 mLs (20 g total) by mouth 3 (three) times daily. What changed: See the new instructions.   midodrine 5 MG tablet Commonly known as: PROAMATINE Take 15 mg by mouth 3 (three) times daily.   MULTIVITAMIN ADULT PO Take 1 tablet by mouth daily.   pantoprazole 40 MG tablet Commonly known as: PROTONIX Take 40 mg by mouth daily.   PARoxetine 40 MG tablet Commonly known as: PAXIL Take 0.5 tablets (20 mg total) by mouth daily.   Xifaxan 550 MG Tabs tablet Generic drug: rifaximin TAKE 1 TABLET(550 MG) BY MOUTH TWICE DAILY What changed: See the new instructions.               Durable Medical Equipment  (From admission, onward)           Start     Ordered   12/18/22 0904  For home use only DME Cane  Once        12/18/22 0903            Procedures/Studies: No results found.   Subjective: Pt back to baseline mentation and eating.  He wants to discharge home.  He is agreeable to home hospice and not continuing dialysis any longer.    Discharge Exam: Vitals:   12/18/22 0317 12/18/22 0615  BP: 101/64 (!) 98/51  Pulse: (!) 108 (!) 111  Resp:  18  Temp: 98.7 F (37.1  C) 98.2 F (36.8 C)  SpO2: 100% 100%   Vitals:   12/17/22 2035 12/18/22 0026 12/18/22 0317 12/18/22 0615  BP: 120/74 (!) 111/59 101/64 (!) 98/51  Pulse: 79 96 (!) 108 (!) 111  Resp: 18   18  Temp: 98.4 F (36.9 C) 98.9 F (37.2 C) 98.7 F (37.1 C) 98.2 F (36.8 C)  TempSrc: Oral Oral  Oral  SpO2: 100% 98% 100% 100%  Weight:      Height:       General: Pt is alert, awake, oriented x 3, not in acute distress Cardiovascular: normal S1/S2 +, no rubs, no gallops Respiratory: CTA bilaterally, no  wheezing, no rhonchi Abdominal: Soft, NT, ND, bowel sounds + Extremities: no cyanosis, mild jaundice   The results of significant diagnostics from this hospitalization (including imaging, microbiology, ancillary and laboratory) are listed below for reference.     Microbiology: No results found for this or any previous visit (from the past 240 hour(s)).   Labs: BNP (last 3 results) No results for input(s): "BNP" in the last 8760 hours. Basic Metabolic Panel: Recent Labs  Lab 12/17/22 1130 12/18/22 0418  NA 134* 138  K 3.5 3.5  CL 98 101  CO2 22 23  GLUCOSE 95 96  BUN 29* 37*  CREATININE 5.99* 6.70*  CALCIUM 9.2 9.5  MG  --  2.0   Liver Function Tests: Recent Labs  Lab 12/17/22 1130 12/18/22 0418  AST 34 35  ALT 12 14  ALKPHOS 79 90  BILITOT 2.5* 3.8*  PROT 5.5* 5.8*  ALBUMIN 3.6 3.8   No results for input(s): "LIPASE", "AMYLASE" in the last 168 hours. Recent Labs  Lab 12/17/22 1130 12/18/22 0418  AMMONIA 76* 44*   CBC: Recent Labs  Lab 12/17/22 1130 12/18/22 0418  WBC 1.8* 1.5*  NEUTROABS 1.0*  --   HGB 8.6* 8.8*  HCT 25.9* 26.9*  MCV 101.6* 101.5*  PLT 52* 55*   Cardiac Enzymes: No results for input(s): "CKTOTAL", "CKMB", "CKMBINDEX", "TROPONINI" in the last 168 hours. BNP: Invalid input(s): "POCBNP" CBG: No results for input(s): "GLUCAP" in the last 168 hours. D-Dimer No results for input(s): "DDIMER" in the last 72 hours. Hgb A1c No results  for input(s): "HGBA1C" in the last 72 hours. Lipid Profile No results for input(s): "CHOL", "HDL", "LDLCALC", "TRIG", "CHOLHDL", "LDLDIRECT" in the last 72 hours. Thyroid function studies No results for input(s): "TSH", "T4TOTAL", "T3FREE", "THYROIDAB" in the last 72 hours.  Invalid input(s): "FREET3" Anemia work up No results for input(s): "VITAMINB12", "FOLATE", "FERRITIN", "TIBC", "IRON", "RETICCTPCT" in the last 72 hours. Urinalysis    Component Value Date/Time   COLORURINE YELLOW 09/26/2022 1810   APPEARANCEUR CLEAR 09/26/2022 1810   LABSPEC 1.012 09/26/2022 1810   PHURINE 6.0 09/26/2022 1810   GLUCOSEU NEGATIVE 09/26/2022 1810   HGBUR NEGATIVE 09/26/2022 1810   BILIRUBINUR NEGATIVE 09/26/2022 1810   KETONESUR NEGATIVE 09/26/2022 1810   PROTEINUR NEGATIVE 09/26/2022 1810   NITRITE NEGATIVE 09/26/2022 1810   LEUKOCYTESUR NEGATIVE 09/26/2022 1810   Sepsis Labs Recent Labs  Lab 12/17/22 1130 12/18/22 0418  WBC 1.8* 1.5*   Microbiology No results found for this or any previous visit (from the past 240 hour(s)).  Time coordinating discharge:   SIGNED:  Irwin Brakeman, MD  [facility] 12/18/2022, 9:23 AM How to contact the [facility] Attending or Consulting provider [place] or covering provider during after hours [place], for this patient?  Check the care team in [facility] and look for a) attending/consulting TR[facility] provider listed and b) the [facility] team listed Log into [email or web address] and use [place]'s universal password to access. If you do not have the password, please contact the hospital operator. Locate the [facility] provider you are looking for under [facility] and page to a number that you can be directly reached. If you still have difficulty reaching the provider, please page the [facility] (Director on Call) for the Hospitalists listed on amion for assistance.

## 2022-12-18 NOTE — Telephone Encounter (Signed)
LMTRC from Hospice.

## 2022-12-18 NOTE — Telephone Encounter (Signed)
[place], [place], [phone]   Wants verbal order to see if Dr. Doren Custard will be attending?

## 2022-12-18 NOTE — TOC Transition Note (Signed)
Transition of Care [facility]) - CM/SW Discharge Note   Patient Details  Name: Bradley Miles MRN: [id number] Date of Birth: Aug 02, 1966  [facility] of [facility]) CM/SW Contact:  Salome Arnt, LCSW Phone Number: 12/18/2022, 9:23 AM   Clinical Narrative: TOC received consult for home hospice. Discussed with pt and pt's wife who request [facility]. Pt will need DME. Referral sent. Marchia Meiers with [facility] notified and reports they can admit today and will contact wife for DME needs. MD updated.     Final next level of care: Home w Hospice Care Barriers to Discharge: Barriers Resolved   Patient Goals and CMS Choice   Choice offered to / list presented to : Patient, Spouse  Discharge Placement                    Name of family member notified: [name]- wife Patient and family notified of of transfer: 12/18/22  Discharge Plan and Services Additional resources added to the After Visit Summary for                              [place] Date Guys: 12/18/22 Time [place]: [phone] Representative spoke with at [place]: [place] Determinants of Health ([place]) Interventions SDOH Screenings   Food Insecurity: No Food Insecurity (12/18/2022)  Housing: Low Risk  (12/18/2022)  Transportation Needs: No Transportation Needs (12/18/2022)  Utilities: Not At Risk (12/18/2022)  Depression (PHQ2-9): High Risk (10/18/2022)  Tobacco Use: High Risk (12/17/2022)     Readmission Risk Interventions    12/18/2022    9:22 AM 10/14/2022    1:43 PM  Readmission Risk Prevention Plan  Transportation Screening Complete Complete  HRI or [place] Complete Complete  Social Work Consult for [place] Planning/Counseling Complete Complete  Palliative Care Screening  Not Applicable  Medication Review Press photographer) Complete Complete

## 2022-12-18 NOTE — Plan of Care (Signed)

## 2022-12-18 NOTE — Progress Notes (Signed)
Patient was in a rush to leave this morning. No meds given, no vital signs taken, and no assessment.

## 2022-12-18 NOTE — Consult Note (Signed)
Bradley Miles Admit Date: 12/17/2022 12/18/2022 Rexene Agent Requesting Physician:  Charm Barges MD  Reason for Consult:  Renal Failure, AMS HPI:  03J complicated history that begins with decompensated end-stage alcoholic cirrhosis with complications including recurrent hepatic encephalopathy, failed TIPS procedures, and recent prolonged stay first in [place] at [facility] for transplant evaluation and treatment of HRS.  At that time his kidney function was quite weak but he was not on dialysis.  He was seen by nephrology and treated with her terlipressin for HRS.  He recently had a medical transfer from [place] to [facility] for a multivisceral transplant evaluation and during that time he initiated hemodialysis as a bridge to transplant.  Unfortunately, he was deemed not a candidate for liver transplantation with a new finding of pulmonary nodules suspicious for malignancy.  No CT of the lung reports are available in Care Everywhere.  Dialysis was continued and he was discharged to return home to regional where he has been set up for MWF HD at [facility].  He presented yesterday to the ED with profound encephalopathy and lethargy.  He had a elevated ammonia levels and a was started on lactulose.  This morning he has improved and is able to participate in conversations.  I reviewed his recent history, specifically focusing on his advanced and decompensated liver disease without possibility of transplant, new finding of malignancy where no foreseeable therapeutic options will be possible because of his hepatic and renal issues.  With wife in the room and 2 sisters joining by phone and these circumstances dialysis was initiated as a bridge to transplant and is no longer possible.  Further I recommended against continuing dialysis because of the ongoing suffering and discomfort that comes with it with a little if any anticipated benefits.  I have specifically and strongly recommended transitioning to hospice.   During this conversation the patient twice agreed with initiating hospice and the family did so as well.   Creat (mg/dL)  Date Value  08/07/2022 2.35 (H)  01/11/2021 0.72  08/19/2019 0.74  06/03/2019 0.69 (L)  12/05/2018 0.92  01/11/2015 0.75  06/22/2014 0.85  03/17/2014 0.96  02/04/2012 1.00   Creatinine, Ser (mg/dL)  Date Value  12/18/2022 6.70 (H)  12/17/2022 5.99 (H)  10/18/2022 3.21 (H)  10/15/2022 2.85 (H)  10/14/2022 3.16 (H)  10/13/2022 3.61 (H)  10/12/2022 4.18 (H)  10/11/2022 4.52 (H)  10/10/2022 4.37 (H)  10/09/2022 4.47 (H)   ROS Balance of 12 systems is negative w/ exceptions as above  PMH  Past Medical History:  Diagnosis Date   Alcoholic cirrhosis ([place])    Anxiety    Controlled type 2 diabetes mellitus with complication, without long-term current use of insulin ([place]) 06/26/2020   Enlarged liver    GERD (gastroesophageal reflux disease)    Hypertension    Palpitations    PVC's (premature ventricular contractions)    Shortness of breath    Tussive syncope 12/22/2019   Varicose veins    PSH  Past Surgical History:  Procedure Laterality Date   BIOPSY  02/02/2019   Procedure: BIOPSY;  Surgeon: Daneil Dolin, MD;  [facility];  Service: Endoscopy;;  gastric   COLONOSCOPY WITH ESOPHAGOGASTRODUODENOSCOPY (EGD)  12/16/2012   internal hemorrhoids, colonic diverticulosis, benign polyps, screening in 2024. EGD with mild erosive reflux esophagitis, small hiatal hernia, negative H.pylori   COLONOSCOPY WITH PROPOFOL N/A 06/22/2021   Procedure: COLONOSCOPY WITH PROPOFOL;  Surgeon: Daneil Dolin, MD;  [facility];  Service: Endoscopy;  Laterality: N/A;  1:45pm   cyst reomved     from spine   ESOPHAGOGASTRODUODENOSCOPY  05/19/09   RMR: Geographic distal esophageal erosions consistent with severe erosive reflux esophagitis. schatzki's ring s/P dilation/small hiatal hernia otherwise normal stomach   ESOPHAGOGASTRODUODENOSCOPY (EGD) WITH  PROPOFOL N/A 02/02/2019   Dr. Gala Romney: retained gastric contents. portal HTN gastropathy   ESOPHAGOGASTRODUODENOSCOPY (EGD) WITH PROPOFOL N/A 11/20/2019   Procedure: ESOPHAGOGASTRODUODENOSCOPY (EGD) WITH PROPOFOL;  Surgeon: Daneil Dolin, MD;  [facility];  Service: Endoscopy;  Laterality: N/A;   ESOPHAGOGASTRODUODENOSCOPY (EGD) WITH PROPOFOL N/A 11/24/2019   Procedure: ESOPHAGOGASTRODUODENOSCOPY (EGD) WITH PROPOFOL;  Surgeon: Ronnette Juniper, MD;  Location: [facility];  Service: Gastroenterology;  Laterality: N/A;   ESOPHAGOGASTRODUODENOSCOPY (EGD) WITH PROPOFOL N/A 08/08/2020   Procedure: ESOPHAGOGASTRODUODENOSCOPY (EGD) WITH PROPOFOL;  Surgeon: Daneil Dolin, MD;  [facility];  Service: Endoscopy;  Laterality: N/A;   ESOPHAGOGASTRODUODENOSCOPY (EGD) WITH PROPOFOL N/A 11/15/2020   Procedure: ESOPHAGOGASTRODUODENOSCOPY (EGD) WITH PROPOFOL;  Surgeon: Ladene Artist, MD;  Location: [facility] ENDOSCOPY;  Service: Endoscopy;  Laterality: N/A;   ESOPHAGOGASTRODUODENOSCOPY (EGD) WITH PROPOFOL N/A 08/19/2022   Procedure: ESOPHAGOGASTRODUODENOSCOPY (EGD) WITH PROPOFOL;  Surgeon: Harvel Quale, MD;  [facility];  Service: Gastroenterology;  Laterality: N/A;   IR ANGIOGRAM SELECTIVE EACH ADDITIONAL VESSEL  11/22/2019   IR ANGIOGRAM SELECTIVE EACH ADDITIONAL VESSEL  11/24/2019   IR ANGIOGRAM SELECTIVE EACH ADDITIONAL VESSEL  11/24/2019   IR ANGIOGRAM SELECTIVE EACH ADDITIONAL VESSEL  11/24/2019   IR ANGIOGRAM SELECTIVE EACH ADDITIONAL VESSEL  11/15/2020   IR EMBO ART  VEN HEMORR LYMPH EXTRAV  INC GUIDE ROADMAPPING  11/22/2019   IR EMBO ART  VEN HEMORR LYMPH EXTRAV  INC GUIDE ROADMAPPING  11/24/2019   IR EMBO ART  VEN HEMORR LYMPH EXTRAV  INC GUIDE ROADMAPPING  11/15/2020   IR FLUORO GUIDE CV LINE RIGHT  11/15/2020   IR RADIOLOGIST EVAL & MGMT  12/29/2019   IR RADIOLOGIST EVAL & MGMT  10/27/2020   IR RADIOLOGIST EVAL & MGMT  04/19/2021   IR TIPS  11/24/2019   IR US  GUIDE VASC ACCESS RIGHT  11/22/2019   IR US GUIDE VASC ACCESS RIGHT  11/15/2020   IR US GUIDE VASC ACCESS RIGHT  11/15/2020   IR VENOGRAM RENAL UNI LEFT  11/22/2019   IR VENOGRAM RENAL UNI LEFT  11/15/2020   POLYPECTOMY  06/22/2021   Procedure: POLYPECTOMY;  Surgeon: Daneil Dolin, MD;  [facility];  Service: Endoscopy;;   RADIOLOGY WITH ANESTHESIA N/A 11/24/2019   Procedure: IR WITH ANESTHESIA;  Surgeon: Radiologist, Medication, MD;  Location: [facility];  Service: Radiology;  Laterality: N/A;   RADIOLOGY WITH ANESTHESIA N/A 11/15/2020   Procedure: IR WITH ANESTHESIA;  Surgeon: Suzette Battiest, MD;  Location: [facility];  Service: Radiology;  Laterality: N/A;   TIPS PROCEDURE N/A 11/22/2019   Procedure: BRTO/TRANS-JUGULAR INTRAHEPATIC PORTAL SHUNT (TIPS);  Surgeon: Corrie Mckusick, DO;  Location: [facility];  Service: Anesthesiology;  Laterality: N/A;   WISDOM TOOTH EXTRACTION     FH  Family History  Problem Relation Age of Onset   Cancer Mother        ? etiology   Heart disease Mother    Colon polyps Sister 24       two sisters   Brain cancer Father    Liver disease Neg Hx    SH  reports that he has been smoking cigarettes. He started smoking about 43  years ago. He has a 30.00 pack-year smoking history. He has never used smokeless tobacco. He reports current alcohol use. He reports that he does not use drugs. Allergies No Known Allergies Home medications Prior to Admission medications   Medication Sig Start Date End Date Taking? Authorizing Provider  allopurinol (ZYLOPRIM) 100 MG tablet Take 1 tablet (100 mg total) by mouth daily. Patient taking differently: Take 100 mg by mouth 3 (three) times a week. Tues, thurs, sat 09/11/22  Yes [name], MD  lactulose (CHRONULAC) 10 GM/15ML solution TAKE 45MLS BY MOUTH EVERY 6 HOURS(TITRATE AS NEEDED TO HAVE 2-4 BOWEL MOVEMENTS DAILY) Patient taking differently: Take 20 g by mouth 3 (three) times daily. 10/25/22  Yes Mahon, Courtney L, NP   midodrine (PROAMATINE) 5 MG tablet Take 15 mg by mouth 3 (three) times daily. 12/14/22  Yes [provider]  Multiple Vitamin (MULTIVITAMIN ADULT PO) Take 1 tablet by mouth daily.   Yes [provider]  pantoprazole (PROTONIX) 40 MG tablet Take 40 mg by mouth daily.   Yes [provider]  PARoxetine (PAXIL) 40 MG tablet Take 1 tablet (40 mg total) by mouth daily. Patient taking differently: Take 20 mg by mouth daily. 10/04/22  Yes [name], MD  XIFAXAN 550 MG TABS tablet TAKE 1 TABLET(550 MG) BY MOUTH TWICE DAILY Patient taking differently: Take 550 mg by mouth 2 (two) times daily. 07/20/22  Yes Mahala Menghini, PA-C    Current Medications Scheduled Meds:  lactulose  20 g Oral TID   midodrine  15 mg Oral TID WC   nicotine  14 mg Transdermal Daily   pantoprazole (PROTONIX) IV  40 mg Intravenous Q24H   PARoxetine  20 mg Oral Daily   rifaximin  550 mg Oral BID   Continuous Infusions: PRN Meds:.ondansetron **OR** ondansetron (ZOFRAN) IV  CBC Recent Labs  Lab 12/17/22 1130 12/18/22 0418  WBC 1.8* 1.5*  NEUTROABS 1.0*  --   HGB 8.6* 8.8*  HCT 25.9* 26.9*  MCV 101.6* 101.5*  PLT 52* 55*   Basic Metabolic Panel Recent Labs  Lab 12/17/22 1130 12/18/22 0418  NA 134* 138  K 3.5 3.5  CL 98 101  CO2 22 23  GLUCOSE 95 96  BUN 29* 37*  CREATININE 5.99* 6.70*  CALCIUM 9.2 9.5    Physical Exam  Blood pressure (!) 98/51, pulse (!) 111, temperature 98.2 F (36.8 C), temperature source Oral, resp. rate 18, height 6' (1.829 m), weight 104.3 kg, SpO2 100 %. 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  Dialysis dependent AKI secondary to HRS: I have recommended against any further dialysis as detailed above.  The patient and family are in agreement.  They wish to transition to hospice quickly.  He can keep his dialysis catheter as it might be useful for hospice. Advanced decompensated alcoholic cirrhosis: Not a candidate for transplant.  Gastroenterology following. Hepatic encephalopathy: Improved this morning after resuming lactulose Report of pulmonary nodules concerning for malignancy at [facility], further supports transitioning to comfort measures. Pancytopenia  Plan As above, no further suggestions.  Plan likely for discharge as soon as hospice is arranged.  Rexene Agent  12/18/2022, 9:07 AM

## 2022-12-19 NOTE — Telephone Encounter (Signed)
Verbal order for dixon to still be the attending.

## 2022-12-21 ENCOUNTER — Telehealth: Payer: Self-pay | Admitting: *Deleted

## 2022-12-21 NOTE — Telephone Encounter (Signed)
Hospice nurse called and states pt needs a paracentesis. She states pt is swelling with fluid. Wants to make sure there is an order to have paracentesis done.

## 2022-12-24 ENCOUNTER — Encounter (HOSPITAL_COMMUNITY): Payer: Self-pay

## 2022-12-24 ENCOUNTER — Ambulatory Visit (HOSPITAL_COMMUNITY)
Admission: RE | Admit: 2022-12-24 | Discharge: 2022-12-24 | Disposition: A | Discharge status: Home / Self Care | Payer: Managed Care, Other (non HMO) | Source: Ambulatory Visit | Attending: Gastroenterology | Admitting: Gastroenterology

## 2022-12-24 DIAGNOSIS — R188 Other ascites: Secondary | ICD-10-CM | POA: Diagnosis present

## 2022-12-24 LAB — BODY FLUID CELL COUNT WITH DIFFERENTIAL
Eos, Fluid: 0 %
Lymphs, Fluid: 49 %
Monocyte-Macrophage-Serous Fluid: 32 % — ABNORMAL LOW (ref 50–90)
Neutrophil Count, Fluid: 19 % (ref 0–25)
Total Nucleated Cell Count, Fluid: 76 cu mm (ref 0–1000)

## 2022-12-24 LAB — GRAM STAIN

## 2022-12-24 MED ORDER — ALBUMIN HUMAN 25 % IV SOLN
INTRAVENOUS | Status: AC
  Administered 2022-12-24: 50 g via INTRAVENOUS
  Filled 2022-12-24: qty 200

## 2022-12-24 MED ORDER — ALBUMIN HUMAN 25 % IV SOLN: 50.0000 g | Freq: Once | INTRAVENOUS | Status: AC

## 2022-12-24 NOTE — Procedures (Signed)
PreOperative Dx: Alcoholic cirrhosis, ascites Postoperative Dx: Alcoholic cirrhosis, ascites Procedure:   US guided paracentesis Radiologist:  Tyron Russell Anesthesia:  10 ml of 1% lidocaine Specimen:  6 L of yellow ascitic fluid EBL:   < 1 ml Complications:  None

## 2022-12-24 NOTE — Progress Notes (Signed)
Patient tolerated left sided paracentesis procedure and 50G of IV albumin well today and 6 Liters of cloudy yellow ascites removed with labs collected and sent for processing. This nurse and radiologist noticed a suture to left lower quadrant and questioned the patient about it and patient stated "I had a paracentesis about 2 weeks ago at a hospital in [place]." Patient verbalized need to follow up with his doctor soon for suture removal and verbalized understanding of discharge instructions. Patient ambulatory at departure with no acute distress noted.

## 2022-12-25 LAB — CYTOLOGY - NON PAP

## 2022-12-25 NOTE — Telephone Encounter (Signed)
Noted. Spoke to pt's wife [name] (DPR) she informed me that he did have a paracentesis yesterday and will have one on Friday.

## 2022-12-26 ENCOUNTER — Ambulatory Visit: Payer: Managed Care, Other (non HMO) | Admitting: Internal Medicine

## 2022-12-26 ENCOUNTER — Encounter: Payer: Self-pay | Admitting: Internal Medicine

## 2022-12-26 VITALS — BP 127/76 | HR 114 | Resp 16 | Ht 72.0 in | Wt 215.0 lb

## 2022-12-26 DIAGNOSIS — J449 Chronic obstructive pulmonary disease, unspecified: Secondary | ICD-10-CM | POA: Diagnosis not present

## 2022-12-26 DIAGNOSIS — R110 Nausea: Secondary | ICD-10-CM | POA: Diagnosis not present

## 2022-12-26 DIAGNOSIS — R7303 Prediabetes: Secondary | ICD-10-CM | POA: Diagnosis not present

## 2022-12-26 DIAGNOSIS — K7031 Alcoholic cirrhosis of liver with ascites: Secondary | ICD-10-CM | POA: Diagnosis not present

## 2022-12-26 MED ORDER — ALBUTEROL SULFATE (2.5 MG/3ML) 0.083% IN NEBU: 2.5000 mg | INHALATION_SOLUTION | Freq: Four times a day (QID) | RESPIRATORY_TRACT | 1 refills | Status: AC | PRN

## 2022-12-26 MED ORDER — PROCHLORPERAZINE 25 MG RE SUPP: 25.0000 mg | Freq: Two times a day (BID) | RECTAL | 0 refills | Status: AC | PRN

## 2022-12-26 NOTE — Assessment & Plan Note (Addendum)
Patient was sent from [facility] to hospital in [place]. A lung nodule was found. Patient was too sick to tolerate surgery and ultimately discharged home. Discussed palliative care and hospice with wife. They are on palliative care at this time and would like to avoid saying hospice in front of patient. He is on palliative care and is a full code.   Plan:  - Continue palliative care, but still receiving full scope of care. Patient is having nausea and provided with Compazine suppository. He is scheduled with GI for therapeutic paracentesis on Friday.  - Comprehensive metabolic panel - CBC with Differential/Platelet

## 2022-12-26 NOTE — Assessment & Plan Note (Signed)
Refilled - albuterol (PROVENTIL) (2.5 MG/3ML) 0.083% nebulizer solution; Take 3 mLs (2.5 mg total) by nebulization every 6 (six) hours as needed for wheezing or shortness of breath.  Dispense: 150 mL; Refill: 1

## 2022-12-26 NOTE — Assessment & Plan Note (Addendum)
Last hemoglobin A1c 6.2 3 months ago. Request check today. Mentioned setting liberal goal in setting of cirrhosis.  - Check hemoglobin A1c

## 2022-12-26 NOTE — Progress Notes (Signed)
   HPI:BradleyBradley Miles is a 57 y.o. male living with alcoholic cirrhosis of the liver with ascites who presents today with concerns of shortness of breath and nausea. The shortness of breath comes on with increasing ascites from cirrhosis. He is scheduled for paracentesis at his GI doctor on Friday. Nausea is a chronic problem and has compazine has helped with nausea and vomiting. However, sometimes he is unable to digest medication before vomiting. He is having 4 bowel movements per day.. For the details of today's visit, please refer to the assessment and plan.  Physical Exam: Vitals:   12/26/22 0812  BP: 127/76  Pulse: (!) 114  Resp: 16  SpO2: 98%  Weight: 215 lb (97.5 kg)  Height: 6' (1.829 m)     Physical Exam Constitutional:      General: He is not in acute distress.    Appearance: He is ill-appearing.  Cardiovascular:     Rate and Rhythm: Regular rhythm. Tachycardia present.  Pulmonary:     Effort: Pulmonary effort is normal. No respiratory distress.     Breath sounds: Examination of the right-lower field reveals wheezing. Wheezing present. No rales.  Abdominal:     General: Bowel sounds are normal.     Comments: Rounded protruded abdomen with dullness to percussion  Neurological:     Mental Status: He is alert and oriented to person, place, and time.      Assessment & Plan:   Alcoholic cirrhosis of liver with ascites [facility]) Patient was sent from [facility] to hospital in [place]. A lung nodule was found. Patient was too sick to tolerate surgery and ultimately discharged home. Discussed palliative care and hospice with wife. They are on palliative care at this time and would like to avoid saying hospice in front of patient. He is on palliative care and is a full code.   Plan:  - Continue palliative care, but still receiving full scope of care. Patient is having nausea and provided with Compazine suppository. He is scheduled with GI for therapeutic paracentesis on Friday.  -  Comprehensive metabolic panel - CBC with Differential/Platelet  COPD, mild (HCC) Refilled - albuterol (PROVENTIL) (2.5 MG/3ML) 0.083% nebulizer solution; Take 3 mLs (2.5 mg total) by nebulization every 6 (six) hours as needed for wheezing or shortness of breath.  Dispense: 150 mL; Refill: 1  Nausea Nausea in setting of decompensated cirrhosis.  - prochlorperazine suppository for when unable to take PO   Prediabetes Last hemoglobin A1c 6.2 3 months ago. Request check today. Mentioned setting liberal goal in setting of cirrhosis.  - Check hemoglobin A1c    Bradley Banister, MD

## 2022-12-26 NOTE — Patient Instructions (Signed)
Thank you, [name] for allowing [place] to provide your care today.   I have ordered the following labs for you:   Lab Orders         Comprehensive metabolic panel         CBC with Differential/Platelet         Hemoglobin A1C        Reminders: I sent medications requested to pharmacy.     Thurmon Fair, M.D.

## 2022-12-26 NOTE — Assessment & Plan Note (Addendum)
Nausea in setting of decompensated cirrhosis.  - prochlorperazine suppository for when unable to take PO

## 2022-12-27 ENCOUNTER — Encounter (HOSPITAL_COMMUNITY): Payer: Self-pay

## 2022-12-27 ENCOUNTER — Ambulatory Visit (HOSPITAL_COMMUNITY)
Admission: RE | Admit: 2022-12-27 | Discharge: 2022-12-27 | Disposition: A | Discharge status: Home / Self Care | Payer: Managed Care, Other (non HMO) | Source: Ambulatory Visit | Attending: Gastroenterology | Admitting: Gastroenterology

## 2022-12-27 DIAGNOSIS — R188 Other ascites: Secondary | ICD-10-CM

## 2022-12-27 LAB — CBC WITH DIFFERENTIAL/PLATELET
Basophils Absolute: 0 10*3/uL (ref 0.0–0.2)
Basos: 1 %
EOS (ABSOLUTE): 0.1 10*3/uL (ref 0.0–0.4)
Eos: 1 %
Hematocrit: 27.1 % — ABNORMAL LOW (ref 37.5–51.0)
Hemoglobin: 9.5 g/dL — ABNORMAL LOW (ref 13.0–17.7)
Immature Grans (Abs): 0.1 10*3/uL (ref 0.0–0.1)
Immature Granulocytes: 1 %
Lymphocytes Absolute: 0.3 10*3/uL — ABNORMAL LOW (ref 0.7–3.1)
Lymphs: 5 %
MCH: 32.9 pg (ref 26.6–33.0)
MCHC: 35.1 g/dL (ref 31.5–35.7)
MCV: 94 fL (ref 79–97)
Monocytes Absolute: 1 10*3/uL — ABNORMAL HIGH (ref 0.1–0.9)
Monocytes: 17 %
Neutrophils Absolute: 4.7 10*3/uL (ref 1.4–7.0)
Neutrophils: 75 %
Platelets: 94 10*3/uL — CL (ref 150–450)
RBC: 2.89 x10E6/uL — ABNORMAL LOW (ref 4.14–5.80)
RDW: 17.3 % — ABNORMAL HIGH (ref 11.6–15.4)
WBC: 6.1 10*3/uL (ref 3.4–10.8)

## 2022-12-27 LAB — COMPREHENSIVE METABOLIC PANEL
ALT: 15 IU/L (ref 0–44)
AST: 39 IU/L (ref 0–40)
Albumin/Globulin Ratio: 1.7 (ref 1.2–2.2)
Albumin: 4 g/dL (ref 3.8–4.9)
Alkaline Phosphatase: 225 IU/L — ABNORMAL HIGH (ref 44–121)
BUN/Creatinine Ratio: 8 — ABNORMAL LOW (ref 9–20)
BUN: 69 mg/dL — ABNORMAL HIGH (ref 6–24)
Bilirubin Total: 1.4 mg/dL — ABNORMAL HIGH (ref 0.0–1.2)
CO2: 13 mmol/L — ABNORMAL LOW (ref 20–29)
Calcium: 9.2 mg/dL (ref 8.7–10.2)
Chloride: 96 mmol/L (ref 96–106)
Creatinine, Ser: 9.07 mg/dL — ABNORMAL HIGH (ref 0.76–1.27)
Globulin, Total: 2.4 g/dL (ref 1.5–4.5)
Glucose: 170 mg/dL — ABNORMAL HIGH (ref 70–99)
Potassium: 3.4 mmol/L — ABNORMAL LOW (ref 3.5–5.2)
Sodium: 132 mmol/L — ABNORMAL LOW (ref 134–144)
Total Protein: 6.4 g/dL (ref 6.0–8.5)
eGFR: 6 mL/min/{1.73_m2} — ABNORMAL LOW (ref >59)

## 2022-12-27 LAB — BODY FLUID CELL COUNT WITH DIFFERENTIAL
Eos, Fluid: 0 %
Lymphs, Fluid: 15 %
Monocyte-Macrophage-Serous Fluid: 75 % (ref 50–90)
Neutrophil Count, Fluid: 10 % (ref 0–25)
Total Nucleated Cell Count, Fluid: 79 cu mm (ref 0–1000)

## 2022-12-27 LAB — HEMOGLOBIN A1C
Est. average glucose Bld gHb Est-mCnc: 114 mg/dL
Hgb A1c MFr Bld: 5.6 % (ref 4.8–5.6)

## 2022-12-27 LAB — GRAM STAIN

## 2022-12-27 MED ORDER — ALBUMIN HUMAN 25 % IV SOLN
INTRAVENOUS | Status: AC
  Filled 2022-12-27: qty 100

## 2022-12-27 MED ORDER — ALBUMIN HUMAN 25 % IV SOLN
INTRAVENOUS | Status: AC
  Administered 2022-12-27: 50 g via INTRAVENOUS
  Filled 2022-12-27: qty 100

## 2022-12-27 MED ORDER — ALBUMIN HUMAN 25 % IV SOLN: 50.0000 g | Freq: Once | INTRAVENOUS | Status: AC

## 2022-12-27 NOTE — Procedures (Signed)
   Cirrhosis Recurrent ascites  US guided LLQ paracentesis  6 L max - yellow fluid Sent for labs per MD  Tolerated well  EBL: none

## 2022-12-27 NOTE — Progress Notes (Signed)
PT tolerated left sided paracentesis procedure and 50G of IV albumin well today and 6 Liters of ascites removed with labs collected and sent for processing. PT verbalized understanding of discharge instructions and left via wheelchair with wife with no acute distress noted.

## 2022-12-28 ENCOUNTER — Ambulatory Visit (HOSPITAL_COMMUNITY): Admission: RE | Admit: 2022-12-28 | Payer: Managed Care, Other (non HMO) | Source: Ambulatory Visit

## 2022-12-29 LAB — CULTURE, BODY FLUID W GRAM STAIN -BOTTLE: Culture: NO GROWTH

## 2023-01-01 ENCOUNTER — Emergency Department (HOSPITAL_COMMUNITY)
Admission: EM | Admit: 2023-01-01 | Discharge: 2023-01-01 | Disposition: A | Discharge status: Home / Self Care | Payer: Managed Care, Other (non HMO) | Attending: Internal Medicine | Admitting: Internal Medicine

## 2023-01-01 ENCOUNTER — Other Ambulatory Visit: Payer: Self-pay

## 2023-01-01 ENCOUNTER — Telehealth: Payer: Self-pay | Admitting: Internal Medicine

## 2023-01-01 ENCOUNTER — Encounter (HOSPITAL_COMMUNITY): Payer: Self-pay

## 2023-01-01 ENCOUNTER — Other Ambulatory Visit (HOSPITAL_COMMUNITY): Payer: Managed Care, Other (non HMO)

## 2023-01-01 ENCOUNTER — Observation Stay (HOSPITAL_COMMUNITY)
Admission: RE | Admit: 2023-01-01 | Discharge: 2023-01-01 | Disposition: A | Discharge status: Home / Self Care | Payer: Managed Care, Other (non HMO) | Source: Ambulatory Visit | Attending: Gastroenterology | Admitting: Gastroenterology

## 2023-01-01 DIAGNOSIS — Z79899 Other long term (current) drug therapy: Secondary | ICD-10-CM | POA: Diagnosis not present

## 2023-01-01 DIAGNOSIS — F17210 Nicotine dependence, cigarettes, uncomplicated: Secondary | ICD-10-CM | POA: Diagnosis not present

## 2023-01-01 DIAGNOSIS — R188 Other ascites: Secondary | ICD-10-CM | POA: Insufficient documentation

## 2023-01-01 DIAGNOSIS — R6520 Severe sepsis without septic shock: Secondary | ICD-10-CM | POA: Diagnosis not present

## 2023-01-01 DIAGNOSIS — K652 Spontaneous bacterial peritonitis: Secondary | ICD-10-CM | POA: Insufficient documentation

## 2023-01-01 DIAGNOSIS — E1122 Type 2 diabetes mellitus with diabetic chronic kidney disease: Secondary | ICD-10-CM | POA: Insufficient documentation

## 2023-01-01 DIAGNOSIS — N179 Acute kidney failure, unspecified: Secondary | ICD-10-CM | POA: Insufficient documentation

## 2023-01-01 DIAGNOSIS — I120 Hypertensive chronic kidney disease with stage 5 chronic kidney disease or end stage renal disease: Secondary | ICD-10-CM | POA: Diagnosis not present

## 2023-01-01 DIAGNOSIS — A419 Sepsis, unspecified organism: Secondary | ICD-10-CM | POA: Insufficient documentation

## 2023-01-01 DIAGNOSIS — R109 Unspecified abdominal pain: Secondary | ICD-10-CM | POA: Diagnosis present

## 2023-01-01 DIAGNOSIS — N186 End stage renal disease: Secondary | ICD-10-CM | POA: Insufficient documentation

## 2023-01-01 DIAGNOSIS — K7682 Hepatic encephalopathy: Secondary | ICD-10-CM | POA: Diagnosis present

## 2023-01-01 LAB — CBC WITH DIFFERENTIAL/PLATELET
Abs Immature Granulocytes: 0.2 10*3/uL — ABNORMAL HIGH (ref 0.00–0.07)
Basophils Absolute: 0 10*3/uL (ref 0.0–0.1)
Basophils Relative: 0 %
Eosinophils Absolute: 0.1 10*3/uL (ref 0.0–0.5)
Eosinophils Relative: 1 %
HCT: 28.2 % — ABNORMAL LOW (ref 39.0–52.0)
Hemoglobin: 9.8 g/dL — ABNORMAL LOW (ref 13.0–17.0)
Immature Granulocytes: 1 %
Lymphocytes Relative: 0 %
Lymphs Abs: 0.1 10*3/uL — ABNORMAL LOW (ref 0.7–4.0)
MCH: 33.7 pg (ref 26.0–34.0)
MCHC: 34.8 g/dL (ref 30.0–36.0)
MCV: 96.9 fL (ref 80.0–100.0)
Monocytes Absolute: 1.6 10*3/uL — ABNORMAL HIGH (ref 0.1–1.0)
Monocytes Relative: 6 %
Neutro Abs: 25.2 10*3/uL — ABNORMAL HIGH (ref 1.7–7.7)
Neutrophils Relative %: 92 %
Platelets: 104 10*3/uL — ABNORMAL LOW (ref 150–400)
RBC: 2.91 MIL/uL — ABNORMAL LOW (ref 4.22–5.81)
RDW: 19.9 % — ABNORMAL HIGH (ref 11.5–15.5)
Smear Review: DECREASED
WBC: 27.2 10*3/uL — ABNORMAL HIGH (ref 4.0–10.5)
nRBC: 0.1 % (ref 0.0–0.2)

## 2023-01-01 LAB — COMPREHENSIVE METABOLIC PANEL
ALT: 18 U/L (ref 0–44)
AST: 40 U/L (ref 15–41)
Albumin: 3.5 g/dL (ref 3.5–5.0)
Alkaline Phosphatase: 180 U/L — ABNORMAL HIGH (ref 38–126)
Anion gap: 25 — ABNORMAL HIGH (ref 5–15)
BUN: 137 mg/dL — ABNORMAL HIGH (ref 6–20)
CO2: 10 mmol/L — ABNORMAL LOW (ref 22–32)
Calcium: 8.8 mg/dL — ABNORMAL LOW (ref 8.9–10.3)
Chloride: 92 mmol/L — ABNORMAL LOW (ref 98–111)
Creatinine, Ser: 12.61 mg/dL — ABNORMAL HIGH (ref 0.61–1.24)
GFR, Estimated: 4 mL/min — ABNORMAL LOW (ref >60)
Glucose, Bld: 139 mg/dL — ABNORMAL HIGH (ref 70–99)
Potassium: 3.1 mmol/L — ABNORMAL LOW (ref 3.5–5.1)
Sodium: 127 mmol/L — ABNORMAL LOW (ref 135–145)
Total Bilirubin: 2.2 mg/dL — ABNORMAL HIGH (ref 0.3–1.2)
Total Protein: 6.5 g/dL (ref 6.5–8.1)

## 2023-01-01 LAB — GRAM STAIN

## 2023-01-01 LAB — LACTIC ACID, PLASMA
Lactic Acid, Venous: 3.5 mmol/L (ref 0.5–1.9)
Lactic Acid, Venous: 3.2 mmol/L (ref 0.5–1.9)

## 2023-01-01 LAB — CYTOLOGY - NON PAP

## 2023-01-01 LAB — PROTIME-INR
INR: 1.8 — ABNORMAL HIGH (ref 0.8–1.2)
Prothrombin Time: 20.7 seconds — ABNORMAL HIGH (ref 11.4–15.2)

## 2023-01-01 LAB — BODY FLUID CELL COUNT WITH DIFFERENTIAL
Eos, Fluid: 0 %
Lymphs, Fluid: 0 %
Monocyte-Macrophage-Serous Fluid: 16 % — ABNORMAL LOW (ref 50–90)
Neutrophil Count, Fluid: 84 % — ABNORMAL HIGH (ref 0–25)
Total Nucleated Cell Count, Fluid: 2305 cu mm — ABNORMAL HIGH (ref 0–1000)

## 2023-01-01 LAB — AMMONIA: Ammonia: 134 umol/L — ABNORMAL HIGH (ref 9–35)

## 2023-01-01 LAB — CBG MONITORING, ED: Glucose-Capillary: 154 mg/dL — ABNORMAL HIGH (ref 70–99)

## 2023-01-01 MED ORDER — ALBUMIN HUMAN 25 % IV SOLN
100.0000 g | Freq: Once | INTRAVENOUS | Status: AC
  Administered 2023-01-01: 100 g via INTRAVENOUS
  Filled 2023-01-01: qty 400

## 2023-01-01 MED ORDER — CEFDINIR 300 MG PO CAPS: 300.0000 mg | ORAL_CAPSULE | Freq: Once | ORAL | 0 refills | Status: AC

## 2023-01-01 MED ORDER — LACTULOSE ENEMA
300.0000 mL | Freq: Once | ORAL | Status: AC
  Administered 2023-01-01: 300 mL via RECTAL
  Filled 2023-01-01: qty 300

## 2023-01-01 MED ORDER — VANCOMYCIN HCL 2000 MG/400ML IV SOLN
2000.0000 mg | Freq: Once | INTRAVENOUS | Status: AC
  Administered 2023-01-01: 2000 mg via INTRAVENOUS
  Filled 2023-01-01: qty 400

## 2023-01-01 MED ORDER — VANCOMYCIN VARIABLE DOSE PER UNSTABLE RENAL FUNCTION (PHARMACIST DOSING): Status: DC

## 2023-01-01 MED ORDER — ALBUMIN HUMAN 25 % IV SOLN
INTRAVENOUS | Status: AC
  Administered 2023-01-01: 50 g via INTRAVENOUS
  Filled 2023-01-01: qty 200

## 2023-01-01 MED ORDER — SODIUM CHLORIDE 0.9 % IV SOLN: 2.0000 g | Freq: Once | INTRAVENOUS | Status: DC

## 2023-01-01 MED ORDER — SODIUM CHLORIDE 0.9 % IV SOLN
2.0000 g | INTRAVENOUS | Status: DC
  Administered 2023-01-01: 2 g via INTRAVENOUS
  Filled 2023-01-01: qty 20

## 2023-01-01 MED ORDER — ALBUMIN HUMAN 25 % IV SOLN: 50.0000 g | Freq: Once | INTRAVENOUS | Status: AC

## 2023-01-01 MED ORDER — SODIUM CHLORIDE 0.9 % IV BOLUS
1000.0000 mL | Freq: Once | INTRAVENOUS | Status: AC
  Administered 2023-01-01: 1000 mL via INTRAVENOUS

## 2023-01-01 NOTE — Progress Notes (Signed)
Paracentesis complete no signs of distress.  

## 2023-01-01 NOTE — Consult Note (Signed)
Initial Consultation Note   Patient: Bradley Miles CVP:[id number] DOB: 03/22/1966 PCP: Bradley Lade, MD DOA: 01/01/2023 DOS: the patient was seen and examined on 01/01/2023 Primary service: Onnie Boer, MD  Referring physician: Dr Criss Alvine Reason for consult: Admission  Assessment/Plan: Comfort care/Hospice patient-acutely and chronically ill patient with significant morbidities including end-stage renal failure, and liver failure.  Sent to the ED after he had paracentesis done with 5 L ascitic fluid drained, with initial analysis showing high white blood count suggesting infection.  Ammonia elevated at 134, BUN 137, creatinine 12.61, potassium 3.1, sodium 127, leukocytosis of 27.2, lactic acid of 3.5.   Admission requested for the following significant medical problems,  -Spontaneous bacterial peritonitis  -Acute hepatic encephalopathy 2/2 alcoholic liver cirrhosis -Progressive renal insufficiency noticed a candidate for dialysis Patient is confirmed DNR.  Patient is not a transplant candidate.  After talking to family, as patient is already hospice patient-spouse Bradley Miles and daughter at bedside, opted to take patient back home, after lactulose enema and requested antibiotics for SBP on discharge. -IV ceftriaxone and vancomycin given in ED. -1 dose of Omnicef given considering significant renal impairment.  I explained to family at bedside that patient may aspirate if he takes anything orally, considering his altered mental status.  They still want to have this prescription in case his mental status improves.  They would rather he pass away at home with family around him - from his chronic renal and liver problems, than from an infection which they feel is treatable and they can do something about. -Patient was discharged from the ED to continue with hospice at home.   HPI: Bradley Miles is a 57 y.o. male with past medical history of end-stage liver disease, alcoholic liver  cirrhosis, tobacco and alcohol abuse, chronic recurrent bouts of hepatic encephalopathy status post 2 failed TIPS procedures and new findings of lung nodules concerning for cancer.  Patient had been declined by 2 transplant centers. He was recently hospitalized 1/8 to 1/9-with encephalopathy, improved with lactulose enemas, evaluated by nephrology, do not continue further dialysis treatments, patient was discharged home with home hospice.  Review of Systems: Able to assess due to altered mental status Past Medical History:  Diagnosis Date   Alcoholic cirrhosis (HCC)    Anxiety    Controlled type 2 diabetes mellitus with complication, without long-term current use of insulin (HCC) 06/26/2020   Enlarged liver    GERD (gastroesophageal reflux disease)    Hypertension    Palpitations    PVC's (premature ventricular contractions)    Shortness of breath    Tussive syncope 12/22/2019   Varicose veins    Past Surgical History:  Procedure Laterality Date   BIOPSY  02/02/2019   Procedure: BIOPSY;  Surgeon: Corbin Ade, MD;  [facility];  Service: Endoscopy;;  gastric   COLONOSCOPY WITH ESOPHAGOGASTRODUODENOSCOPY (EGD)  12/16/2012   internal hemorrhoids, colonic diverticulosis, benign polyps, screening in 2024. EGD with mild erosive reflux esophagitis, small hiatal hernia, negative H.pylori   COLONOSCOPY WITH PROPOFOL N/A 06/22/2021   Procedure: COLONOSCOPY WITH PROPOFOL;  Surgeon: Corbin Ade, MD;  [facility]: [facility];  Service: Endoscopy;  Laterality: N/A;  1:45pm   cyst reomved     from spine   ESOPHAGOGASTRODUODENOSCOPY  05/19/09   RMR: Geographic distal esophageal erosions consistent with severe erosive reflux esophagitis. schatzki's ring s/P dilation/small hiatal hernia otherwise normal stomach   ESOPHAGOGASTRODUODENOSCOPY (EGD) WITH PROPOFOL N/A 02/02/2019   Dr. Jena Gauss: retained gastric  contents. portal HTN gastropathy   ESOPHAGOGASTRODUODENOSCOPY (EGD) WITH PROPOFOL N/A  11/20/2019   Procedure: ESOPHAGOGASTRODUODENOSCOPY (EGD) WITH PROPOFOL;  Surgeon: Corbin Ade, MD;  [facility];  Service: Endoscopy;  Laterality: N/A;   ESOPHAGOGASTRODUODENOSCOPY (EGD) WITH PROPOFOL N/A 11/24/2019   Procedure: ESOPHAGOGASTRODUODENOSCOPY (EGD) WITH PROPOFOL;  Surgeon: Kerin Salen, MD;  Location: [facility] ENDOSCOPY;  Service: Gastroenterology;  Laterality: N/A;   ESOPHAGOGASTRODUODENOSCOPY (EGD) WITH PROPOFOL N/A 08/08/2020   Procedure: ESOPHAGOGASTRODUODENOSCOPY (EGD) WITH PROPOFOL;  Surgeon: Corbin Ade, MD;  [facility];  Service: Endoscopy;  Laterality: N/A;   ESOPHAGOGASTRODUODENOSCOPY (EGD) WITH PROPOFOL N/A 11/15/2020   Procedure: ESOPHAGOGASTRODUODENOSCOPY (EGD) WITH PROPOFOL;  Surgeon: Meryl Dare, MD;  Location: [facility] ENDOSCOPY;  Service: Endoscopy;  Laterality: N/A;   ESOPHAGOGASTRODUODENOSCOPY (EGD) WITH PROPOFOL N/A 08/19/2022   Procedure: ESOPHAGOGASTRODUODENOSCOPY (EGD) WITH PROPOFOL;  Surgeon: Dolores Frame, MD;  [facility];  Service: Gastroenterology;  Laterality: N/A;   IR ANGIOGRAM SELECTIVE EACH ADDITIONAL VESSEL  11/22/2019   IR ANGIOGRAM SELECTIVE EACH ADDITIONAL VESSEL  11/24/2019   IR ANGIOGRAM SELECTIVE EACH ADDITIONAL VESSEL  11/24/2019   IR ANGIOGRAM SELECTIVE EACH ADDITIONAL VESSEL  11/24/2019   IR ANGIOGRAM SELECTIVE EACH ADDITIONAL VESSEL  11/15/2020   IR EMBO ART  VEN HEMORR LYMPH EXTRAV  INC GUIDE ROADMAPPING  11/22/2019   IR EMBO ART  VEN HEMORR LYMPH EXTRAV  INC GUIDE ROADMAPPING  11/24/2019   IR EMBO ART  VEN HEMORR LYMPH EXTRAV  INC GUIDE ROADMAPPING  11/15/2020   IR FLUORO GUIDE CV LINE RIGHT  11/15/2020   IR RADIOLOGIST EVAL & MGMT  12/29/2019   IR RADIOLOGIST EVAL & MGMT  10/27/2020   IR RADIOLOGIST EVAL & MGMT  04/19/2021   IR TIPS  11/24/2019   IR US GUIDE VASC ACCESS RIGHT  11/22/2019   IR US GUIDE VASC ACCESS RIGHT  11/15/2020   IR US GUIDE VASC ACCESS RIGHT  11/15/2020   IR VENOGRAM RENAL  UNI LEFT  11/22/2019   IR VENOGRAM RENAL UNI LEFT  11/15/2020   POLYPECTOMY  06/22/2021   Procedure: POLYPECTOMY;  Surgeon: Corbin Ade, MD;  [facility];  Service: Endoscopy;;   RADIOLOGY WITH ANESTHESIA N/A 11/24/2019   Procedure: IR WITH ANESTHESIA;  Surgeon: Radiologist, Medication, MD;  Location: [facility] OR;  Service: Radiology;  Laterality: N/A;   RADIOLOGY WITH ANESTHESIA N/A 11/15/2020   Procedure: IR WITH ANESTHESIA;  Surgeon: Bennie Dallas, MD;  Location: [facility] OR;  Service: Radiology;  Laterality: N/A;   TIPS PROCEDURE N/A 11/22/2019   Procedure: BRTO/TRANS-JUGULAR INTRAHEPATIC PORTAL SHUNT (TIPS);  Surgeon: Gilmer Mor, DO;  Location: [facility] OR;  Service: Anesthesiology;  Laterality: N/A;   WISDOM TOOTH EXTRACTION     Social History:  reports that he has been smoking cigarettes. He started smoking about 43 years ago. He has a 15.00 pack-year smoking history. He has never used smokeless tobacco. He reports current alcohol use. He reports that he does not use drugs.  No Known Allergies  Family History  Problem Relation Age of Onset   Cancer Mother        ? etiology   Heart disease Mother    Colon polyps Sister 48       two sisters   Brain cancer Father    Liver disease Neg Hx     Prior to Admission medications   Medication Sig Start Date End Date Taking? Authorizing Provider  albuterol (PROVENTIL) (2.5 MG/3ML) 0.083% nebulizer solution Take 3  mLs (2.5 mg total) by nebulization every 6 (six) hours as needed for wheezing or shortness of breath. 12/26/22  Yes Gardenia Phlegm, MD  allopurinol (ZYLOPRIM) 100 MG tablet Take 1 tablet (100 mg total) by mouth 3 (three) times a week. Tues, thurs, sat Patient taking differently: Take 100 mg by mouth daily. Tues, thurs, sat 12/19/22  Yes Johnson, Clanford L, MD  folic acid (FOLVITE) 800 MCG tablet Take 400 mcg by mouth daily.   Yes [provider]  lactulose (CHRONULAC) 10 GM/15ML solution Take 30 mLs (20 g total) by mouth 3  (three) times daily. Patient taking differently: Take 20 g by mouth 4 (four) times daily as needed for moderate constipation. 12/18/22  Yes Johnson, Clanford L, MD  midodrine (PROAMATINE) 5 MG tablet Take 15 mg by mouth 3 (three) times daily. 12/14/22  Yes [provider]  Multiple Vitamin (MULTIVITAMIN ADULT PO) Take 1 tablet by mouth daily.   Yes [provider]  oxyCODONE (OXY IR/ROXICODONE) 5 MG immediate release tablet Take 2.5 mg by mouth every 4 (four) hours as needed for moderate pain. 12/31/22  Yes [provider]  pantoprazole (PROTONIX) 40 MG tablet Take 40 mg by mouth 2 (two) times daily.   Yes [provider]  PARoxetine (PAXIL) 40 MG tablet Take 0.5 tablets (20 mg total) by mouth daily. Patient taking differently: Take 40 mg by mouth daily. 12/18/22  Yes Johnson, Clanford L, MD  prochlorperazine (COMPAZINE) 10 MG tablet Take 5 mg by mouth 3 (three) times daily as needed. 12/19/22  Yes [provider]  prochlorperazine (COMPAZINE) 5 MG tablet Take 5 mg by mouth 3 (three) times daily as needed. 12/19/22  Yes [provider]  Thiamine HCl (VITAMIN B-1) 250 MG tablet Take 250 mg by mouth daily.   Yes [provider]  Turmeric (QC TUMERIC COMPLEX PO) Take 1 capsule by mouth daily.   Yes [provider]  vitamin D3 (CHOLECALCIFEROL) 25 MCG tablet Take 1,000 Units by mouth daily.   Yes [provider]  XIFAXAN 550 MG TABS tablet TAKE 1 TABLET(550 MG) BY MOUTH TWICE DAILY Patient taking differently: Take 550 mg by mouth 2 (two) times daily. 07/20/22  Yes Tiffany Kocher, PA-C  zinc gluconate 50 MG tablet Take 50 mg by mouth daily.   Yes [provider]  amoxicillin-clavulanate (AUGMENTIN) 875-125 MG tablet Take 1 tablet by mouth 2 (two) times daily. Patient not taking: Reported on 01/01/2023 12/18/22   [provider]  prochlorperazine (COMPAZINE) 25 MG suppository Place 1 suppository (25 mg total) rectally  every 12 (twelve) hours as needed for nausea or vomiting. 12/26/22   Gardenia Phlegm, MD    Physical Exam:  Limited exam due to altered mental status Vitals:   01/01/23 1830 01/01/23 1900 01/01/23 1930 01/01/23 2000  BP: 100/68 117/77 110/67 111/72  Pulse: (!) 118 (!) 112  (!) 113  Resp: (!) 21 13 17  (!) 21  Temp: 97.8 F (36.6 C)     TempSrc: Oral     SpO2: 100% 98%  99%  Weight:      Height:        Constitutional: Altered, somnolent, occasionally open his eyes but drifts back to sleep not responding to questions Eyes: PERRL ENMT: Mucous membranes are dry  Neck: normal, supple, no masses, no thyromegaly Respiratory: clear to auscultation bilaterally, no wheezing, no crackles. Normal respiratory effort. No accessory muscle use.  Cardiovascular: Regular rate and rhythm, no murmurs / rubs /  gallops. No extremity edema.  Abdomen: Diffuse tenderness, no masses palpated. No hepatosplenomegaly. Bowel sounds positive.  Musculoskeletal: no clubbing / cyanosis. No joint deformity upper and lower extremities.  Skin: no rashes, lesions, ulcers. No induration Neurologic: Unable to assess Psychiatric: Somnolent  Family Communication: Spouse Bradley Miles, and daughter at bedside.  Thank you very much for involving [place] in the care of your patient.  Author: Onnie Boer, MD 01/01/2023 10:21 PM  For on call review [email or web address].

## 2023-01-01 NOTE — Telephone Encounter (Signed)
Pt wife called stating cath port was not removed & she is wanting to know if he can get a referral to get this removed

## 2023-01-01 NOTE — Progress Notes (Signed)
Date and time results received: 01/01/23 1713 (use smartphrase ".now" to insert current time)  Test: Lactic acid Critical Value: 3.5  Name of Provider Notified: MD Criss Alvine  Orders Received? Or Actions Taken?:  Pending orders

## 2023-01-01 NOTE — ED Triage Notes (Addendum)
Pt had a paracentesis today and was called to come to ER for IV antibiotics due to a cell count in the fluid drained.  Wife is concerned infection temporary dialysis cath is infected as well.

## 2023-01-01 NOTE — Discharge Instructions (Signed)
Please follow-up with hospice on discharge from the hospital.

## 2023-01-01 NOTE — Telephone Encounter (Signed)
If pt needs referral pt wife will call back

## 2023-01-01 NOTE — ED Notes (Signed)
AC called to bring lactulose from main pharmacy.

## 2023-01-01 NOTE — ED Notes (Signed)
Pt was able to stand up and transfer to wheelchair with standby assist.

## 2023-01-01 NOTE — Progress Notes (Signed)
Pharmacy Antibiotic Note  Bradley Miles is a 57 y.o. male admitted on 01/01/2023 with positive paracentesis fluid culture/possible SBP.  Pharmacy has been consulted for vancomycin dosing.  Plan: Vancomycin 2000 mg IV x 1 dose. Vanco dosing by levels for unstable renal function. Ceftriaxone 2000 mg IV every 24 hours. Monitor labs, c/s, and vanco levels as indicated.  Height: 6' (182.9 cm) Weight: 97.5 kg (215 lb) IBW/kg (Calculated) : 77.6  Temp (24hrs), Avg:97.9 F (36.6 C), Min:97.5 F (36.4 C), Max:98.2 F (36.8 C)  Recent Labs  Lab 12/26/22 0840  WBC 6.1  CREATININE 9.07*    Estimated Creatinine Clearance: 11.3 mL/min (A) (by C-G formula based on SCr of 9.07 mg/dL (H)).    No Known Allergies  Antimicrobials this admission: Vanco 1/23 >> CTX 1/23 >>   Microbiology results: 1/23 BCx: pending 1/23 Paracentesis fluid: gram + cocci  Thank you for allowing pharmacy to be a part of this patient's care.  Judeth Cornfield, PharmD Clinical Pharmacist 01/01/2023 4:19 PM

## 2023-01-01 NOTE — ED Provider Notes (Signed)
[name] AT [facility] Provider Note   CSN: [id number] Arrival date & time: 01/01/23  1322     History {Add pertinent medical, surgical, social history, OB history to HPI:1} Chief Complaint  Patient presents with   Abnormal Lab    Bradley Miles is a 57 y.o. male.  HPI 57 year old male with a history of cirrhosis with frequent paracentesis as well as renal failure but is not a candidate for dialysis presents with SBP.  He had a paracentesis earlier today and was called and told to come in due to abnormal numbers consistent with SBP.  Wife notes has been complaining of some abdominal pain for couple days.  Today he was not quite himself but even now he is more altered and she is concerned about his ammonia.  He has a dialysis catheter in his right chest that she feels like is contributing.  This was placed in [place] when it was seemed like he was a candidate for transplant but that is no longer the case and he was recently told the dialysis would not be a good idea for him.  It has not overtly look infected or had drainage which she was wearing if he can come out.  Otherwise the patient is confused and unable to contribute much to the history.  Home Medications Prior to Admission medications   Medication Sig Start Date End Date Taking? Authorizing Provider  albuterol (PROVENTIL) (2.5 MG/3ML) 0.083% nebulizer solution Take 3 mLs (2.5 mg total) by nebulization every 6 (six) hours as needed for wheezing or shortness of breath. 12/26/22  Yes Gardenia Phlegm, MD  allopurinol (ZYLOPRIM) 100 MG tablet Take 1 tablet (100 mg total) by mouth 3 (three) times a week. Tues, thurs, sat Patient taking differently: Take 100 mg by mouth daily. Tues, thurs, sat 12/19/22  Yes Johnson, Clanford L, MD  midodrine (PROAMATINE) 5 MG tablet Take 15 mg by mouth 3 (three) times daily. 12/14/22  Yes [provider]  Multiple Vitamin (MULTIVITAMIN ADULT PO) Take 1 tablet by mouth  daily.   Yes [provider]  oxyCODONE (OXY IR/ROXICODONE) 5 MG immediate release tablet Take 2.5 mg by mouth every 4 (four) hours as needed for moderate pain. 12/31/22  Yes [provider]  pantoprazole (PROTONIX) 40 MG tablet Take 40 mg by mouth 2 (two) times daily.   Yes [provider]  PARoxetine (PAXIL) 40 MG tablet Take 0.5 tablets (20 mg total) by mouth daily. Patient taking differently: Take 40 mg by mouth daily. 12/18/22  Yes Johnson, Clanford L, MD  prochlorperazine (COMPAZINE) 10 MG tablet Take 5 mg by mouth 3 (three) times daily as needed. 12/19/22  Yes [provider]  prochlorperazine (COMPAZINE) 5 MG tablet Take 5 mg by mouth 3 (three) times daily as needed. 12/19/22  Yes [provider]  XIFAXAN 550 MG TABS tablet TAKE 1 TABLET(550 MG) BY MOUTH TWICE DAILY Patient taking differently: Take 550 mg by mouth 2 (two) times daily. 07/20/22  Yes Tiffany Kocher, PA-C  amoxicillin-clavulanate (AUGMENTIN) 875-125 MG tablet Take 1 tablet by mouth 2 (two) times daily. Patient not taking: Reported on 01/01/2023 12/18/22   [provider]  lactulose (CHRONULAC) 10 GM/15ML solution Take 30 mLs (20 g total) by mouth 3 (three) times daily. 12/18/22   Johnson, Clanford L, MD  prochlorperazine (COMPAZINE) 25 MG suppository Place 1 suppository (25 mg total) rectally every 12 (twelve) hours as needed for nausea or vomiting. 12/26/22  Gardenia Phlegm, MD      Allergies    Patient has no known allergies.    Review of Systems   Review of Systems  Unable to perform ROS: Mental status change  Constitutional:  Negative for fever.  Gastrointestinal:  Positive for abdominal pain.    Physical Exam Updated Vital Signs BP 126/70   Pulse (!) 110   Temp (!) 97.5 F (36.4 C) (Oral)   Resp 18   Ht 6' (1.829 m)   Wt 97.5 kg   SpO2 100%   BMI 29.16 kg/m  Physical Exam Vitals and nursing note reviewed.  Constitutional:      Appearance: He is  well-developed. He is ill-appearing.  HENT:     Head: Normocephalic and atraumatic.  Cardiovascular:     Rate and Rhythm: Regular rhythm. Tachycardia present.     Heart sounds: Normal heart sounds.  Pulmonary:     Effort: Pulmonary effort is normal. Tachypnea present. No accessory muscle usage.     Breath sounds: Normal breath sounds.  Abdominal:     General: There is no distension.     Palpations: Abdomen is soft.     Tenderness: There is no abdominal tenderness.  Skin:    General: Skin is warm and dry.  Neurological:     Comments: Patient is somnolent but arousable. Quickly falls asleep when not talked to.     ED Results / Procedures / Treatments   Labs (all labs ordered are listed, but only abnormal results are displayed) Labs Reviewed  CULTURE, BLOOD (ROUTINE X 2)  CULTURE, BLOOD (ROUTINE X 2)  COMPREHENSIVE METABOLIC PANEL  LACTIC ACID, PLASMA  LACTIC ACID, PLASMA  CBC WITH DIFFERENTIAL/PLATELET  AMMONIA  PROTIME-INR  CBG MONITORING, ED    EKG None  Radiology US Paracentesis  Result Date: 01/01/2023 INDICATION: Ascites. EXAM: ULTRASOUND GUIDED therapeutic and diagnostic PARACENTESIS MEDICATIONS: None. COMPLICATIONS: None immediate. PROCEDURE: Informed written consent was obtained from the patient after a discussion of the risks, benefits and alternatives to treatment. A timeout was performed prior to the initiation of the procedure. Initial ultrasound scanning demonstrates a large amount of ascites within the left lower abdominal quadrant. The right lower abdomen was prepped and draped in the usual sterile fashion. 1% lidocaine was used for local anesthesia. Following this, a paracentesis catheter was introduced. An ultrasound image was saved for documentation purposes. The paracentesis was performed. The catheter was removed and a dressing was applied. The patient tolerated the procedure well without immediate post procedural complication. Patient received  post-procedure intravenous albumin; see nursing notes for details. FINDINGS: A total of approximately 5 L of serous fluid was removed. Samples were sent to the laboratory as requested by the clinical team. IMPRESSION: Successful ultrasound-guided paracentesis yielding 5 liters of peritoneal fluid. Electronically Signed   By: Lupita Raider M.D.   On: 01/01/2023 11:47    Procedures Procedures  {Document cardiac monitor, telemetry assessment procedure when appropriate:1}  Medications Ordered in ED Medications  vancomycin (VANCOREADY) IVPB 2000 mg/400 mL (has no administration in time range)  albumin human 25 % solution 100 g (has no administration in time range)    ED Course/ Medical Decision Making/ A&P   {   Click here for ABCD2, HEART and other calculatorsREFRESH Note before signing :1}                          Medical Decision Making Amount and/or Complexity of Data Reviewed Labs:  ordered.  Risk Prescription drug management.   ***  {Document critical care time when appropriate:1} {Document review of labs and clinical decision tools ie heart score, Chads2Vasc2 etc:1}  {Document your independent review of radiology images, and any outside records:1} {Document your discussion with family members, caretakers, and with consultants:1} {Document social determinants of health affecting pt's care:1} {Document your decision making why or why not admission, treatments were needed:1} Final Clinical Impression(s) / ED Diagnoses Final diagnoses:  None    Rx / DC Orders ED Discharge Orders     None

## 2023-01-02 LAB — BLOOD CULTURE ID PANEL (REFLEXED) - BCID2

## 2023-01-03 ENCOUNTER — Other Ambulatory Visit: Payer: Self-pay | Admitting: *Deleted

## 2023-01-03 DIAGNOSIS — Z95828 Presence of other vascular implants and grafts: Secondary | ICD-10-CM

## 2023-01-03 LAB — CULTURE, BODY FLUID W GRAM STAIN -BOTTLE: Culture: NO GROWTH

## 2023-01-04 LAB — CULTURE, BLOOD (ROUTINE X 2)
Special Requests: ADEQUATE
Special Requests: ADEQUATE

## 2023-01-04 LAB — CYTOLOGY - NON PAP

## 2023-01-06 LAB — CULTURE, BODY FLUID W GRAM STAIN -BOTTLE: Culture: NO GROWTH

## 2023-01-10 NOTE — ED Notes (Addendum)
0626 January 24, 2023 Pt and pt's spouse called and notified of positive blood cultures. Spouse verbally stated she understood and pt and spouse would discuss whether or not they wanted to come to the hospital for treatment.

## 2023-01-10 NOTE — Progress Notes (Signed)
PHARMACY - PHYSICIAN COMMUNICATION CRITICAL VALUE ALERT - BLOOD CULTURE IDENTIFICATION (BCID)  Bradley Miles is an 57 y.o. male who presented to [facility] on 01/01/2023  Assessment:  E. Coli bacteremia    Changes to prescribed antibiotics recommended:  Per nursing note earlier today- patient is home hospice and has been notified of positive blood cultures but discussing whether they want treatment or not.  No results found for this or any previous visit.  Ramond Craver 01/18/23  12:13 PM

## 2023-01-10 DEATH — deceased
